# Patient Record
Sex: Male | Born: 1960 | Race: Black or African American | Hispanic: No | Marital: Single | Smoking: Current some day smoker
Health system: Southern US, Community
[De-identification: ages and names within clinical notes are randomized; demographics above are authoritative.]

## PROBLEM LIST (undated history)

## (undated) DIAGNOSIS — J40 Bronchitis, not specified as acute or chronic: Secondary | ICD-10-CM

## (undated) DIAGNOSIS — K920 Hematemesis: Secondary | ICD-10-CM

## (undated) DIAGNOSIS — R569 Unspecified convulsions: Secondary | ICD-10-CM

## (undated) DIAGNOSIS — Z7189 Other specified counseling: Secondary | ICD-10-CM

## (undated) DIAGNOSIS — I2699 Other pulmonary embolism without acute cor pulmonale: Secondary | ICD-10-CM

## (undated) DIAGNOSIS — E860 Dehydration: Secondary | ICD-10-CM

## (undated) DIAGNOSIS — Z720 Tobacco use: Secondary | ICD-10-CM

## (undated) DIAGNOSIS — R109 Unspecified abdominal pain: Secondary | ICD-10-CM

## (undated) DIAGNOSIS — F1010 Alcohol abuse, uncomplicated: Secondary | ICD-10-CM

## (undated) DIAGNOSIS — Z5111 Encounter for antineoplastic chemotherapy: Secondary | ICD-10-CM

## (undated) DIAGNOSIS — J069 Acute upper respiratory infection, unspecified: Secondary | ICD-10-CM

## (undated) DIAGNOSIS — I10 Essential (primary) hypertension: Secondary | ICD-10-CM

## (undated) DIAGNOSIS — J449 Chronic obstructive pulmonary disease, unspecified: Secondary | ICD-10-CM

## (undated) DIAGNOSIS — C801 Malignant (primary) neoplasm, unspecified: Secondary | ICD-10-CM

## (undated) DIAGNOSIS — C3492 Malignant neoplasm of unspecified part of left bronchus or lung: Secondary | ICD-10-CM

## (undated) DIAGNOSIS — B029 Zoster without complications: Secondary | ICD-10-CM

## (undated) DIAGNOSIS — K2970 Gastritis, unspecified, without bleeding: Secondary | ICD-10-CM

## (undated) HISTORY — DX: Unspecified abdominal pain: R10.9

## (undated) HISTORY — PX: NO PAST SURGERIES: SHX2092

## (undated) HISTORY — DX: Encounter for antineoplastic chemotherapy: Z51.11

## (undated) HISTORY — DX: Malignant neoplasm of unspecified part of left bronchus or lung: C34.92

## (undated) HISTORY — DX: Other specified counseling: Z71.89

## (undated) HISTORY — DX: Dehydration: E86.0

## (undated) HISTORY — DX: Essential (primary) hypertension: I10

---

## 1998-01-04 ENCOUNTER — Encounter: Payer: Self-pay | Admitting: Otolaryngology

## 1998-01-04 ENCOUNTER — Encounter: Payer: Self-pay | Admitting: Emergency Medicine

## 1998-01-04 ENCOUNTER — Inpatient Hospital Stay (HOSPITAL_COMMUNITY): Admission: EM | Admit: 1998-01-04 | Discharge: 1998-01-07 | Payer: Self-pay | Admitting: Emergency Medicine

## 1998-11-11 ENCOUNTER — Encounter: Payer: Self-pay | Admitting: Emergency Medicine

## 1998-11-11 ENCOUNTER — Emergency Department (HOSPITAL_COMMUNITY): Admission: EM | Admit: 1998-11-11 | Discharge: 1998-11-11 | Payer: Self-pay | Admitting: Emergency Medicine

## 1998-11-28 ENCOUNTER — Emergency Department (HOSPITAL_COMMUNITY): Admission: EM | Admit: 1998-11-28 | Discharge: 1998-11-28 | Payer: Self-pay | Admitting: Emergency Medicine

## 2001-06-08 ENCOUNTER — Emergency Department (HOSPITAL_COMMUNITY): Admission: EM | Admit: 2001-06-08 | Discharge: 2001-06-08 | Payer: Self-pay | Admitting: Emergency Medicine

## 2001-06-08 ENCOUNTER — Encounter: Payer: Self-pay | Admitting: Emergency Medicine

## 2004-08-19 ENCOUNTER — Emergency Department (HOSPITAL_COMMUNITY): Admission: EM | Admit: 2004-08-19 | Discharge: 2004-08-20 | Payer: Self-pay | Admitting: Emergency Medicine

## 2007-04-15 ENCOUNTER — Emergency Department (HOSPITAL_COMMUNITY): Admission: EM | Admit: 2007-04-15 | Discharge: 2007-04-15 | Payer: Self-pay | Admitting: Emergency Medicine

## 2009-03-27 ENCOUNTER — Emergency Department (HOSPITAL_COMMUNITY): Admission: EM | Admit: 2009-03-27 | Discharge: 2009-03-27 | Payer: Self-pay | Admitting: Emergency Medicine

## 2010-10-23 ENCOUNTER — Ambulatory Visit (INDEPENDENT_AMBULATORY_CARE_PROVIDER_SITE_OTHER): Payer: Self-pay

## 2010-10-23 ENCOUNTER — Inpatient Hospital Stay (INDEPENDENT_AMBULATORY_CARE_PROVIDER_SITE_OTHER)
Admission: RE | Admit: 2010-10-23 | Discharge: 2010-10-23 | Disposition: A | Discharge status: Home / Self Care | Payer: Self-pay | Source: Ambulatory Visit | Attending: Family Medicine | Admitting: Family Medicine

## 2010-10-23 DIAGNOSIS — J42 Unspecified chronic bronchitis: Secondary | ICD-10-CM

## 2011-04-15 ENCOUNTER — Emergency Department (INDEPENDENT_AMBULATORY_CARE_PROVIDER_SITE_OTHER): Payer: Self-pay

## 2011-04-15 ENCOUNTER — Encounter (HOSPITAL_COMMUNITY): Payer: Self-pay | Admitting: *Deleted

## 2011-04-15 ENCOUNTER — Emergency Department (INDEPENDENT_AMBULATORY_CARE_PROVIDER_SITE_OTHER)
Admission: EM | Admit: 2011-04-15 | Discharge: 2011-04-15 | Disposition: A | Discharge status: Home / Self Care | Payer: Self-pay | Source: Home / Self Care | Attending: Family Medicine | Admitting: Family Medicine

## 2011-04-15 DIAGNOSIS — J40 Bronchitis, not specified as acute or chronic: Secondary | ICD-10-CM

## 2011-04-15 MED ORDER — ALBUTEROL SULFATE HFA 108 (90 BASE) MCG/ACT IN AERS
2.0000 | INHALATION_SPRAY | Freq: Once | RESPIRATORY_TRACT | Status: AC
  Administered 2011-04-15: 2 via RESPIRATORY_TRACT

## 2011-04-15 MED ORDER — ALBUTEROL SULFATE HFA 108 (90 BASE) MCG/ACT IN AERS: 2.0000 | INHALATION_SPRAY | RESPIRATORY_TRACT | Status: DC | PRN

## 2011-04-15 MED ORDER — ALBUTEROL SULFATE HFA 108 (90 BASE) MCG/ACT IN AERS
INHALATION_SPRAY | RESPIRATORY_TRACT | Status: AC
  Filled 2011-04-15: qty 6.7

## 2011-04-15 NOTE — Discharge Instructions (Signed)
Your xray was negative for any acute findings such as pneumonia or other problems or infection. My suspicion is that this is chronic bronchitis from your many years of smoking. This is inflammation of the lungs due to the irritants of the cigarette smoke, along with long-term damage. I suspect you will need further testing such as pulmonary function testing, and possibly a maintenance medication at some point in the future. I have provided you an albuterol inhaler. Use albuterol every 4 to 6 hours for the next 24 to 48 hours, then as needed. Please follow up with [facility] for continued evaluation and management of your symptoms. Return to care should your symptoms not improve, or worsen in any way, such as shortness of breath or chest pain.

## 2011-04-15 NOTE — ED Notes (Signed)
Pt  Reports  Symptoms  Of  Cough  /  Congested        X  6  Months      -  Pt  Reports   Symptoms  Not  releived  By otc  meds      He  Also  Reports  Symptoms  Of  l  Lower  toothace  Which  He reports developed  Over  The  Weekend    He  Is  Masked  And   Is  Sitting  Upright on  The  Exam  Table   maske  And in a  private  room

## 2011-04-15 NOTE — ED Provider Notes (Signed)
History     CSN: [id number]  Arrival date & time 04/15/11  1449   First MD Initiated Contact with Patient 04/15/11 1521      Chief Complaint  Patient presents with  . Cough    (Consider location/radiation/quality/duration/timing/severity/associated sxs/prior treatment) HPI Comments: [name] presents for evaluation under recommendation of the Congregational Nurse for persistent, productive cough, over the last 6 months. He is a smoker, down to less than a pack a day, but was smoking 2.5 packs per day at his peak. He reports one episode of hemoptysis a few days ago, but mostly reports sputum production. He denies any fever. He does report new pain in his lower RIGHT teeth, but he has poor dentition.   Patient is a 51 y.o. male presenting with cough. The history is provided by the patient.  Cough This is a chronic problem. The current episode started more than 1 week ago. The cough is productive of sputum. There has been no fever. Pertinent negatives include no chest pain and no shortness of breath. He has tried nothing for the symptoms. He is a smoker.    History reviewed. No pertinent past medical history.  History reviewed. No pertinent past surgical history.  History reviewed. No pertinent family history.  History  Substance Use Topics  . Smoking status: Not on file  . Smokeless tobacco: Not on file  . Alcohol Use: Not on file      Review of Systems  Constitutional: Negative.   HENT: Positive for dental problem.   Eyes: Negative.   Respiratory: Positive for cough. Negative for shortness of breath.   Cardiovascular: Negative.  Negative for chest pain.  Gastrointestinal: Negative.   Genitourinary: Negative.   Musculoskeletal: Negative.   Skin: Negative.   Neurological: Negative.     Allergies  Review of patient's allergies indicates not on file.  Home Medications  No current outpatient prescriptions on file.  BP 128/89  Pulse 86  Temp(Src) 98.4 F (36.9 C)  (Oral)  Resp 18  SpO2 97%  Physical Exam  Nursing note and vitals reviewed. Constitutional: He is oriented to person, place, and time. He appears well-developed and well-nourished.  HENT:  Head: Normocephalic and atraumatic.  Mouth/Throat: Abnormal dentition. Dental caries present. No dental abscesses.         Poor dentition throughout with multiple missing teeth  Eyes: EOM are normal.  Neck: Normal range of motion.  Cardiovascular: Normal rate and regular rhythm.   Pulmonary/Chest: Effort normal and breath sounds normal. He has no decreased breath sounds. He has no wheezes. He has no rhonchi.  Musculoskeletal: Normal range of motion.  Neurological: He is alert and oriented to person, place, and time.  Skin: Skin is warm and dry.  Psychiatric: His behavior is normal.    ED Course  Procedures (including critical care time)  Labs Reviewed - No data to display Dg Chest 2 View  04/15/2011  *RADIOLOGY REPORT*  Clinical Data: Cough for 6 months.  CHEST - 2 VIEW  Comparison: 10/23/2010  Findings: The lungs are clear without focal infiltrate, edema, pneumothorax or pleural effusion. The cardiopericardial silhouette is within normal limits for size. Imaged bony structures of the thorax are intact.  IMPRESSION: Stable.  No acute findings.  Original Report Authenticated By: ERIC A. MANSELL, M.D.     1. Bronchitis       MDM  Xray reviewed by radiologist and myself; likely chronic bronchitis; given albuterol inhaler; advised to follow up with [facility]  Johnathan Munda, MD 04/15/11 [phone]

## 2011-05-22 DIAGNOSIS — R569 Unspecified convulsions: Secondary | ICD-10-CM

## 2011-05-22 HISTORY — DX: Unspecified convulsions: R56.9

## 2011-05-28 ENCOUNTER — Emergency Department (HOSPITAL_COMMUNITY): Payer: Self-pay

## 2011-05-28 ENCOUNTER — Inpatient Hospital Stay (HOSPITAL_COMMUNITY)
Admission: EM | Admit: 2011-05-28 | Discharge: 2011-05-30 | DRG: 101 | Disposition: A | Discharge status: Home / Self Care | Payer: Self-pay | Attending: Internal Medicine | Admitting: Internal Medicine

## 2011-05-28 ENCOUNTER — Encounter (HOSPITAL_COMMUNITY): Payer: Self-pay

## 2011-05-28 DIAGNOSIS — Z5900 Homelessness unspecified: Secondary | ICD-10-CM

## 2011-05-28 DIAGNOSIS — Z23 Encounter for immunization: Secondary | ICD-10-CM

## 2011-05-28 DIAGNOSIS — G40909 Epilepsy, unspecified, not intractable, without status epilepticus: Secondary | ICD-10-CM

## 2011-05-28 DIAGNOSIS — F17200 Nicotine dependence, unspecified, uncomplicated: Secondary | ICD-10-CM | POA: Diagnosis present

## 2011-05-28 DIAGNOSIS — R569 Unspecified convulsions: Principal | ICD-10-CM | POA: Diagnosis present

## 2011-05-28 DIAGNOSIS — Z720 Tobacco use: Secondary | ICD-10-CM | POA: Diagnosis present

## 2011-05-28 DIAGNOSIS — F1010 Alcohol abuse, uncomplicated: Secondary | ICD-10-CM | POA: Diagnosis present

## 2011-05-28 HISTORY — DX: Acute upper respiratory infection, unspecified: J06.9

## 2011-05-28 HISTORY — DX: Tobacco use: Z72.0

## 2011-05-28 HISTORY — DX: Bronchitis, not specified as acute or chronic: J40

## 2011-05-28 HISTORY — DX: Unspecified convulsions: R56.9

## 2011-05-28 LAB — HEPATIC FUNCTION PANEL
ALT: 15 U/L (ref 0–53)
AST: 27 U/L (ref 0–37)
Bilirubin, Direct: 0.1 mg/dL (ref 0.0–0.3)
Total Protein: 7.1 g/dL (ref 6.0–8.3)

## 2011-05-28 LAB — CARDIAC PANEL(CRET KIN+CKTOT+MB+TROPI)
CK, MB: 3.4 ng/mL (ref 0.3–4.0)
Relative Index: 0.7 (ref 0.0–2.5)
Total CK: 370 U/L — ABNORMAL HIGH (ref 7–232)

## 2011-05-28 LAB — RAPID URINE DRUG SCREEN, HOSP PERFORMED
Barbiturates: NOT DETECTED
Benzodiazepines: NOT DETECTED
Cocaine: NOT DETECTED
Opiates: NOT DETECTED

## 2011-05-28 LAB — POCT I-STAT, CHEM 8
Calcium, Ion: 1.13 mmol/L (ref 1.12–1.32)
Chloride: 106 mEq/L (ref 96–112)
Glucose, Bld: 124 mg/dL — ABNORMAL HIGH (ref 70–99)
HCT: 49 % (ref 39.0–52.0)
Hemoglobin: 16.7 g/dL (ref 13.0–17.0)
TCO2: 19 mmol/L (ref 0–100)

## 2011-05-28 LAB — DIFFERENTIAL
Basophils Absolute: 0 10*3/uL (ref 0.0–0.1)
Basophils Relative: 0 % (ref 0–1)
Eosinophils Absolute: 0.2 10*3/uL (ref 0.0–0.7)
Eosinophils Relative: 3 % (ref 0–5)
Monocytes Absolute: 0.7 10*3/uL (ref 0.1–1.0)
Neutro Abs: 2.7 10*3/uL (ref 1.7–7.7)

## 2011-05-28 LAB — HEMOGLOBIN A1C
Hgb A1c MFr Bld: 5.6 % (ref <5.7)
Mean Plasma Glucose: 114 mg/dL (ref <117)

## 2011-05-28 LAB — HIV ANTIBODY (ROUTINE TESTING W REFLEX): HIV: NONREACTIVE

## 2011-05-28 LAB — CBC
HCT: 44.8 % (ref 39.0–52.0)
Hemoglobin: 15.3 g/dL (ref 13.0–17.0)
MCH: 33.4 pg (ref 26.0–34.0)
MCH: 33.6 pg (ref 26.0–34.0)
MCHC: 35 g/dL (ref 30.0–36.0)
MCHC: 35.4 g/dL (ref 30.0–36.0)
MCV: 94.7 fL (ref 78.0–100.0)
RDW: 13.8 % (ref 11.5–15.5)

## 2011-05-28 LAB — CREATININE, SERUM: Creatinine, Ser: 0.9 mg/dL (ref 0.50–1.35)

## 2011-05-28 LAB — GLUCOSE, CAPILLARY

## 2011-05-28 LAB — TSH: TSH: 0.521 u[IU]/mL (ref 0.350–4.500)

## 2011-05-28 MED ORDER — ENOXAPARIN SODIUM 40 MG/0.4ML ~~LOC~~ SOLN
40.0000 mg | SUBCUTANEOUS | Status: DC
  Administered 2011-05-28 – 2011-05-29 (×2): 40 mg via SUBCUTANEOUS
  Filled 2011-05-28 (×3): qty 0.4

## 2011-05-28 MED ORDER — LORAZEPAM 2 MG/ML IJ SOLN: 0.0000 mg | Freq: Four times a day (QID) | INTRAMUSCULAR | Status: DC

## 2011-05-28 MED ORDER — LORAZEPAM 2 MG/ML IJ SOLN: 0.0000 mg | Freq: Two times a day (BID) | INTRAMUSCULAR | Status: DC

## 2011-05-28 MED ORDER — VITAMIN B-1 100 MG PO TABS
100.0000 mg | ORAL_TABLET | Freq: Every day | ORAL | Status: DC
  Administered 2011-05-29 – 2011-05-30 (×2): 100 mg via ORAL
  Filled 2011-05-28 (×3): qty 1

## 2011-05-28 MED ORDER — SODIUM CHLORIDE 0.9 % IV SOLN
INTRAVENOUS | Status: DC
  Administered 2011-05-28 – 2011-05-29 (×3): via INTRAVENOUS

## 2011-05-28 MED ORDER — LORAZEPAM 2 MG/ML IJ SOLN: 1.0000 mg | Freq: Four times a day (QID) | INTRAMUSCULAR | Status: DC | PRN

## 2011-05-28 MED ORDER — PNEUMOCOCCAL VAC POLYVALENT 25 MCG/0.5ML IJ INJ
0.5000 mL | INJECTION | INTRAMUSCULAR | Status: AC
  Administered 2011-05-29: 0.5 mL via INTRAMUSCULAR
  Filled 2011-05-28: qty 0.5

## 2011-05-28 MED ORDER — ASPIRIN EC 81 MG PO TBEC
81.0000 mg | DELAYED_RELEASE_TABLET | Freq: Every day | ORAL | Status: DC
  Administered 2011-05-29 – 2011-05-30 (×2): 81 mg via ORAL
  Filled 2011-05-28 (×3): qty 1

## 2011-05-28 MED ORDER — SODIUM CHLORIDE 0.9 % IJ SOLN
3.0000 mL | Freq: Two times a day (BID) | INTRAMUSCULAR | Status: DC
  Administered 2011-05-28 – 2011-05-29 (×3): 3 mL via INTRAVENOUS

## 2011-05-28 MED ORDER — ADULT MULTIVITAMIN W/MINERALS CH
1.0000 | ORAL_TABLET | Freq: Every day | ORAL | Status: DC
  Administered 2011-05-29 – 2011-05-30 (×2): 1 via ORAL
  Filled 2011-05-28 (×3): qty 1

## 2011-05-28 MED ORDER — FOLIC ACID 1 MG PO TABS
1.0000 mg | ORAL_TABLET | Freq: Every day | ORAL | Status: DC
  Administered 2011-05-29 – 2011-05-30 (×2): 1 mg via ORAL
  Filled 2011-05-28 (×3): qty 1

## 2011-05-28 MED ORDER — LORAZEPAM 1 MG PO TABS
1.0000 mg | ORAL_TABLET | Freq: Four times a day (QID) | ORAL | Status: DC | PRN
  Administered 2011-05-30: 1 mg via ORAL
  Filled 2011-05-28: qty 1

## 2011-05-28 MED ORDER — THIAMINE HCL 100 MG/ML IJ SOLN
100.0000 mg | Freq: Every day | INTRAMUSCULAR | Status: DC
  Filled 2011-05-28 (×2): qty 1

## 2011-05-28 MED ORDER — ACETAMINOPHEN 650 MG RE SUPP: 650.0000 mg | Freq: Four times a day (QID) | RECTAL | Status: DC | PRN

## 2011-05-28 MED ORDER — ACETAMINOPHEN 325 MG PO TABS
650.0000 mg | ORAL_TABLET | Freq: Four times a day (QID) | ORAL | Status: DC | PRN
  Administered 2011-05-28 – 2011-05-30 (×5): 650 mg via ORAL
  Filled 2011-05-28 (×5): qty 2

## 2011-05-28 NOTE — Progress Notes (Signed)
Pt. admitted with seizure dx;  c/o HA; given Tylenol, took one pill first with choking episode, attempted 2nd pill with another choking episode, wet voice afterward. Dr. Clyde Lundborg notified, order to make npo for now.   Zemira Zehring, Hassell Done, RN

## 2011-05-28 NOTE — Progress Notes (Signed)
Pt. requesting MD be called to determine when he can eat. Dr. Clyde Lundborg notified; per Dr. Clyde Lundborg give pt. Water and if pt. does fine with water, pt. may be started on regular diet. This RN d/w MD that pt. did fine with water previously but choked on pills. Dr. Clyde Lundborg again stated to re-attempt water and if pt. does fine with water alone, it is okay to start regular diet. Pt. given sips of water and did not show signs of aspiration. Dr. Clyde Lundborg made aware and order given to start regular diet.   Terral Cooks, Hassell Done, RN

## 2011-05-28 NOTE — ED Notes (Signed)
RN unavailable at this time.

## 2011-05-28 NOTE — ED Notes (Signed)
Pt presents to ED after a witnessed seizure. Bystanders states pt started to "convulse" and fell in th bushes. Upon EMS arrival pt was postitical, pt unable to answer questions per ems

## 2011-05-28 NOTE — ED Provider Notes (Signed)
History     CSN: [id number]  Arrival date & time 05/28/11  0840   First MD Initiated Contact with Patient 05/28/11 0845      Chief Complaint  Patient presents with  . Seizures     HPI Pt presents to ED after a witnessed seizure. Bystanders states pt started to "convulse" and fell in th bushes. Upon EMS arrival pt was postitical, pt unable to answer questions per ems.  Patient has no history of seizures.  At this time history is difficult because of patient's postictal state.  Past Medical History  Diagnosis Date  . Bronchitis due to tobacco use   . Shortness of breath   . Recurrent upper respiratory infection (URI)   . Seizures 05/2011    new onset    Past Surgical History  Procedure Date  . No past surgeries     History reviewed. No pertinent family history.  History  Substance Use Topics  . Smoking status: Current Everyday Smoker -- 1.0 packs/day for 30 years    Types: Cigarettes  . Smokeless tobacco: Never Used  . Alcohol Use: Yes     occasional      Review of Systems  Unable to perform ROS   Allergies  Review of patient's allergies indicates no known allergies.  Home Medications  No current outpatient prescriptions on file.  BP 139/80  Pulse 53  Temp(Src) 97.8 F (36.6 C) (Oral)  Resp 18  Ht 5\' 11"  (1.803 m)  Wt 142 lb 6.7 oz (64.6 kg)  BMI 19.86 kg/m2  SpO2 94%  Physical Exam  Nursing note and vitals reviewed. Constitutional: Vital signs are normal. He appears well-developed and well-nourished. No distress.  HENT:  Head: Normocephalic and atraumatic.  Eyes: Pupils are equal, round, and reactive to light.  Neck: Normal range of motion.  Cardiovascular: Normal rate and intact distal pulses.   Pulmonary/Chest: No respiratory distress.  Abdominal: Normal appearance. He exhibits no distension.  Musculoskeletal: Normal range of motion.  Neurological: He is alert. He has normal strength. No cranial nerve deficit.  Skin: Skin is warm and dry. No  rash noted.  Psychiatric: He has a normal mood and affect. His behavior is normal.    ED Course  Procedures (including critical care time)  Labs Reviewed  URINE RAPID DRUG SCREEN (HOSP PERFORMED) - Abnormal; Notable for the following:    Tetrahydrocannabinol POSITIVE (*)    All other components within normal limits  DIFFERENTIAL - Abnormal; Notable for the following:    Neutrophils Relative 40 (*)    Lymphocytes Relative 47 (*)    All other components within normal limits  POCT I-STAT, CHEM 8 - Abnormal; Notable for the following:    Glucose, Bld 124 (*)    All other components within normal limits  CBC - Abnormal; Notable for the following:    WBC 12.7 (*)    All other components within normal limits  URINALYSIS, ROUTINE W REFLEX MICROSCOPIC - Abnormal; Notable for the following:    Color, Urine AMBER (*) BIOCHEMICALS MAY BE AFFECTED BY COLOR   Bilirubin Urine SMALL (*)    Ketones, ur 15 (*)    All other components within normal limits  CARDIAC PANEL(CRET KIN+CKTOT+MB+TROPI) - Abnormal; Notable for the following:    Total CK 370 (*)    All other components within normal limits  CARDIAC PANEL(CRET KIN+CKTOT+MB+TROPI) - Abnormal; Notable for the following:    Total CK 450 (*)    All other components within normal limits  BASIC METABOLIC PANEL - Abnormal; Notable for the following:    GFR calc non Af Amer 81 (*)    All other components within normal limits  CBC - Abnormal; Notable for the following:    RBC 4.19 (*)    All other components within normal limits  CARDIAC PANEL(CRET KIN+CKTOT+MB+TROPI) - Abnormal; Notable for the following:    Total CK 403 (*)    All other components within normal limits  GLUCOSE, CAPILLARY - Abnormal; Notable for the following:    Glucose-Capillary 170 (*)    All other components within normal limits  ETHANOL  CBC  CREATININE, SERUM  TSH  HEMOGLOBIN A1C  HIV ANTIBODY (ROUTINE TESTING)  HEPATIC FUNCTION PANEL  GLUCOSE, CAPILLARY    GLUCOSE, CAPILLARY   Dg Chest 2 View  05/28/2011  *RADIOLOGY REPORT*  Clinical Data: New onset of seizures  CHEST - 2 VIEW  Comparison: 04/15/2011  Findings: Cardiomediastinal silhouette is stable.  No acute infiltrate or pleural effusion.  No pulmonary edema.  Bony thorax is stable.  No diagnostic pneumothorax.  IMPRESSION: No active disease.  No significant change.  Original Report Authenticated By: Natasha Mead, M.D.   Ct Head Wo Contrast  05/28/2011  *RADIOLOGY REPORT*  Clinical Data: Headache.  Seizure.  CT HEAD WITHOUT CONTRAST  Technique:  Contiguous axial images were obtained from the base of the skull through the vertex without contrast.  Comparison: Head CT scan 04/15/2007.  Findings: The brain appears normal without evidence of infarction, hemorrhage, mass lesion, mass effect, midline shift or abnormal extra-axial fluid collection.  No hydrocephalus or pneumocephalus. Calvarium intact.  Imaged paranasal sinuses and mastoid air cells are clear.  IMPRESSION: Negative examination.  Original Report Authenticated By: Bernadene Bell. Maricela Curet, M.D.     1. Seizure disorder       MDM          Nelia Shi, MD 05/29/11 [phone]

## 2011-05-28 NOTE — ED Notes (Signed)
Pt still unable to give a urine sample at this time ?

## 2011-05-28 NOTE — H&P (Signed)
Hospital Admission Note Date: 05/28/2011  Patient name: [name] Medical record number: [id number] Date of birth: [birth year] Age: 51 y.o. Gender: male PCP: DEFAULT,PROVIDER, MD, MD  Medical Service: Maurice March  Attending physician: Dr. Franky Macho Hours (7AM-5PM):  First Contact: Dr. Clyde Lundborg       Pager: [phone] Second Contact: Dr. Saralyn Pilar Pager: [phone]  After Hours (after 5PM)/ Weekend / Holidays: First Contact: Pager: [phone] Second Contact: Pager: [phone]   Chief Complaint: Seizure  History of Present Illness: Patient is a 51 year old male with no significant past medical history, experienced an unprovoked seizure activity with postictal state witnessed by bystanders, EMS were called and brought the patient to [facility] emergency room, upon arrival patient was postictal and slowly became more oriented and alert, does not recall the event. Denies any similar events in the past. Reports occasional alcohol use, last drink was Sunday, 05/26/2011. Denies any drug abuse. Initial workup in the ED including head CT and routine lab work were negative to find a trigger for his seizure activity. We were called to admit for further evaluation.   Meds: Medication List    Notice       You have not been prescribed any medications.             Allergies: Allergies as of 05/28/2011  . (No Known Allergies)   History reviewed. No pertinent past medical history. History reviewed. No pertinent past surgical history. History reviewed. No pertinent family history. History   Social History  . Marital Status: Single    Spouse Name: N/A    Number of Children: N/A  . Years of Education: N/A   Occupational History  . Not on file.   Social History Main Topics  . Smoking status: Current Everyday Smoker  . Smokeless tobacco: Not on file  . Alcohol Use: Yes  . Drug Use:   . Sexually Active:    Other Topics Concern  . Not on file   Social History Narrative  . Patient is  homeless.    Review of Systems: Negative except per history of present illness  Physical Exam: Blood pressure 144/77, pulse 68, temperature 97.6 F (36.4 C), temperature source Oral, resp. rate 24, SpO2 93.00%. General:  alert, well-developed, and cooperative to examination.   Head:  normocephalic and atraumatic.   Eyes:  vision grossly intact, pupils equal, pupils round, pupils reactive to light, no injection and anicteric.   Mouth:  pharynx pink and moist, no erythema, and no exudates.   Neck:  supple, full ROM, no thyromegaly, no JVD, and no carotid bruits.   Lungs:  normal respiratory effort, no accessory muscle use, normal breath sounds, no crackles, and no wheezes. Heart:  normal rate, regular rhythm, no murmur, no gallop, and no rub.   Abdomen:  soft, non-tender, normal bowel sounds, no distention, no guarding, no rebound tenderness, no hepatomegaly, and no splenomegaly.   Msk:  no joint swelling, no joint warmth, and no redness over joints.   Pulses:  2+ DP/PT pulses bilaterally Extremities:  No cyanosis, clubbing, edema Neurologic:  alert & oriented X3, cranial nerves II-XII intact, strength normal in all extremities, sensation intact to light touch, and gait normal.     Lab results: Basic Metabolic Panel:  Basename 05/28/11 0915  NA 139  K 3.6  CL 106  CO2 --  GLUCOSE 124*  BUN 11  CREATININE 1.20  CALCIUM --  MG --  PHOS --   CBC:  Basename 05/28/11  0915 05/28/11 0900  WBC -- 6.7  NEUTROABS -- 2.7  HGB 16.7 15.7  HCT 49.0 44.8  MCV -- 95.3  PLT -- 227   Urine Drug Screen: Drugs of Abuse  Pending   Alcohol Level:  Basename 05/28/11 0900  ETH <11   Urinalysis: No results found for this basename: COLORURINE:2,APPERANCEUR:2,LABSPEC:2,PHURINE:2,GLUCOSEU:2,HGBUR:2,BILIRUBINUR:2,KETONESUR:2,PROTEINUR:2,UROBILINOGEN:2,NITRITE:2,LEUKOCYTESUR:2 in the last 72 hours   Imaging results:  Dg Chest 2 View  05/28/2011  *RADIOLOGY REPORT*  Clinical Data: New  onset of seizures  CHEST - 2 VIEW  Comparison: 04/15/2011  Findings: Cardiomediastinal silhouette is stable.  No acute infiltrate or pleural effusion.  No pulmonary edema.  Bony thorax is stable.  No diagnostic pneumothorax.  IMPRESSION: No active disease.  No significant change.  Original Report Authenticated By: Natasha Mead, M.D.   Ct Head Wo Contrast  05/28/2011  *RADIOLOGY REPORT*  Clinical Data: Headache.  Seizure.  CT HEAD WITHOUT CONTRAST  Technique:  Contiguous axial images were obtained from the base of the skull through the vertex without contrast.  Comparison: Head CT scan 04/15/2007.  Findings: The brain appears normal without evidence of infarction, hemorrhage, mass lesion, mass effect, midline shift or abnormal extra-axial fluid collection.  No hydrocephalus or pneumocephalus. Calvarium intact.  Imaged paranasal sinuses and mastoid air cells are clear.  IMPRESSION: Negative examination.  Original Report Authenticated By: Bernadene Bell. Maricela Curet, M.D.    Assessment & Plan by Problem:  Seizure: This is new onset, witnessed by bystanders, pt does not recall the episodes but did have postictal confusion, patient states that he has never experienced this episode in the past, reports drinking alcohol; last drink was Sunday May 26, 2011. However patient denies excessive alcohol consumption. So far in the ED all tests including chemistries and Head CT failed to identify a trigger for his seizures such as Head trauma, Brain tumors, Stroke, Intracranial infection, Cerebral degeneration, Congenital brain malformations, Inborn errors of metabolism, UDS is pending.  Plan -Admit to tele and monitor for seizure activity.  -Will check for other triggers for his seizures such as EEG, electrolytes, thyroid function tests, HIV antibody, glucose, liver function tests, thyroid function tests and toxicology screens.  -Will also hold any medications that can trigger seizures. -Will place the patient on CIWA protocol and  IV Ativan as needed for seizure activity -Check CE x3 to and place on tele r/o cardiac cause of his event.  -Consider MRI or Neuro consult for further input.  -For now we will not empirically start antiseizure medications given that the patient has only had one seizure episode, will consider starting an antiepileptic agent if patient has a recurrent seizure.   DVT: Lovenox  Signed: Mazie Fencl 05/28/2011, 11:11 AM

## 2011-05-29 ENCOUNTER — Inpatient Hospital Stay (HOSPITAL_COMMUNITY): Payer: Self-pay

## 2011-05-29 DIAGNOSIS — R569 Unspecified convulsions: Secondary | ICD-10-CM

## 2011-05-29 LAB — CARDIAC PANEL(CRET KIN+CKTOT+MB+TROPI): Relative Index: 0.8 (ref 0.0–2.5)

## 2011-05-29 LAB — BASIC METABOLIC PANEL
BUN: 14 mg/dL (ref 6–23)
CO2: 27 mEq/L (ref 19–32)
Calcium: 9 mg/dL (ref 8.4–10.5)
Creatinine, Ser: 1.04 mg/dL (ref 0.50–1.35)
Glucose, Bld: 88 mg/dL (ref 70–99)

## 2011-05-29 LAB — URINALYSIS, ROUTINE W REFLEX MICROSCOPIC
Leukocytes, UA: NEGATIVE
Nitrite: NEGATIVE
Protein, ur: NEGATIVE mg/dL
Specific Gravity, Urine: 1.024 (ref 1.005–1.030)
Urobilinogen, UA: 0.2 mg/dL (ref 0.0–1.0)

## 2011-05-29 LAB — CBC
MCH: 32.2 pg (ref 26.0–34.0)
MCV: 95.5 fL (ref 78.0–100.0)
Platelets: 204 10*3/uL (ref 150–400)
RDW: 14.1 % (ref 11.5–15.5)

## 2011-05-29 LAB — GLUCOSE, CAPILLARY: Glucose-Capillary: 91 mg/dL (ref 70–99)

## 2011-05-29 NOTE — Consult Note (Signed)
Pt is a 1 ppd smoker who says he wants to quit but not in the near future. Interested in hearing about options available like with medical aids to help him with quitting. Discussed risk factors again briefly as well as options in pharmacotherapy to help pt with quitting. Pt verbalizes understanding. Referred to 1-800 quit now for f/u and support. Discussed oral fixation substitutes, second hand smoke and in home smoking policy. Reviewed and gave pt Written education/contact information.

## 2011-05-29 NOTE — Progress Notes (Signed)
Subjective:  Patient had good sleep and feels good, no new episode of seizure. Afebrile. No new issues.  Objective: Vital signs in last 24 hours: Filed Vitals:   05/28/11 2200 05/29/11 0100 05/29/11 0600 05/29/11 0938  BP: 135/66 127/69 139/80 119/76  Pulse: 69 52 53 55  Temp: 98.8 F (37.1 C) 98.2 F (36.8 C) 97.8 F (36.6 C) 97.3 F (36.3 C)  TempSrc: Oral Oral Oral Oral  Resp: 19 18 18 18   Height:  5\' 11"  (1.803 m)    Weight:  142 lb 6.7 oz (64.6 kg)    SpO2: 95% 97% 94% 99%   Weight change:   Intake/Output Summary (Last 24 hours) at 05/29/11 1032 Last data filed at 05/29/11 0747  Gross per 24 hour  Intake    360 ml  Output    350 ml  Net     10 ml   Blood pressure 144/77, pulse 68, temperature 97.6 F (36.4 C), temperature source Oral, resp. rate 24, SpO2 93.00%. General:  alert, well-developed, and cooperative to examination.   Head:  normocephalic and atraumatic.   Eyes:  vision grossly intact, pupils equal, pupils round, pupils reactive to light, no injection and anicteric.   Mouth:  pharynx pink and moist, no erythema, and no exudates.   Neck:  supple, full ROM, no thyromegaly, no JVD, and no carotid bruits.   Lungs:  normal respiratory effort, no accessory muscle use, normal breath sounds, no crackles, and no wheezes. Heart:  normal rate, regular rhythm, no murmur, no gallop, and no rub.   Abdomen:  soft, non-tender, normal bowel sounds, no distention, no guarding, no rebound tenderness, no hepatomegaly, and no splenomegaly.   Msk:  no joint swelling, no joint warmth, and no redness over joints.   Pulses:  2+ DP/PT pulses bilaterally Extremities:  No cyanosis, clubbing, edema Neurologic:  alert & oriented X3, cranial nerves II-XII intact, strength normal in all extremities, sensation intact to light touch, and gait normal.    Lab Results: Basic Metabolic Panel:  Lab 05/29/11 4403 05/28/11 1351 05/28/11 0915  NA 138 -- 139  K 3.6 -- 3.6  CL 103 -- 106  CO2  27 -- --  GLUCOSE 88 -- 124*  BUN 14 -- 11  CREATININE 1.04 0.90 --  CALCIUM 9.0 -- --  MG -- -- --  PHOS -- -- --   Liver Function Tests:  Lab 05/28/11 1351  AST 27  ALT 15  ALKPHOS 73  BILITOT 0.6  PROT 7.1  ALBUMIN 4.2   BC:  Lab 05/29/11 0545 05/28/11 1351 05/28/11 0900  WBC 6.7 12.7* --  NEUTROABS -- -- 2.7  HGB 13.5 15.3 --  HCT 40.0 43.2 --  MCV 95.5 94.7 --  PLT 204 221 --   Cardiac Enzymes:  Lab 05/29/11 0545 05/28/11 2135 05/28/11 1353  CKTOTAL 403* 450* 370*  CKMB 3.3 3.2 3.4  CKMBINDEX -- -- --  TROPONINI <0.30 <0.30 <0.30    Lab 05/29/11 0655 05/28/11 2106 05/28/11 1634  GLUCAP 91 170* 75   Hemoglobin A1C:  Lab 05/28/11 1351  HGBA1C 5.6  Thyroid Function Tests:  Lab 05/28/11 1351  TSH 0.521  T4TOTAL --  FREET4 --  T3FREE --  THYROIDAB --    Drugs of Abuse     Component Value Date/Time   LABOPIA NONE DETECTED 05/28/2011 1031   COCAINSCRNUR NONE DETECTED 05/28/2011 1031   LABBENZ NONE DETECTED 05/28/2011 1031   AMPHETMU NONE DETECTED 05/28/2011 1031   THCU  POSITIVE* 05/28/2011 1031   LABBARB NONE DETECTED 05/28/2011 1031    Alcohol Level:  Lab 05/28/11 0900  ETH <11   Urinalysis:  Lab 05/29/11 0122  COLORURINE AMBER*  LABSPEC 1.024  PHURINE 6.0  GLUCOSEU NEGATIVE  HGBUR NEGATIVE  BILIRUBINUR SMALL*  KETONESUR 15*  PROTEINUR NEGATIVE  UROBILINOGEN 0.2  NITRITE NEGATIVE  LEUKOCYTESUR NEGATIVE    Micro Results: No results found for this or any previous visit (from the past 240 hour(s)). Studies/Results: Dg Chest 2 View  05/28/2011  *RADIOLOGY REPORT*  Clinical Data: New onset of seizures  CHEST - 2 VIEW  Comparison: 04/15/2011  Findings: Cardiomediastinal silhouette is stable.  No acute infiltrate or pleural effusion.  No pulmonary edema.  Bony thorax is stable.  No diagnostic pneumothorax.  IMPRESSION: No active disease.  No significant change.  Original Report Authenticated By: Natasha Mead, M.D.   Ct Head Wo Contrast  05/28/2011   *RADIOLOGY REPORT*  Clinical Data: Headache.  Seizure.  CT HEAD WITHOUT CONTRAST  Technique:  Contiguous axial images were obtained from the base of the skull through the vertex without contrast.  Comparison: Head CT scan 04/15/2007.  Findings: The brain appears normal without evidence of infarction, hemorrhage, mass lesion, mass effect, midline shift or abnormal extra-axial fluid collection.  No hydrocephalus or pneumocephalus. Calvarium intact.  Imaged paranasal sinuses and mastoid air cells are clear.  IMPRESSION: Negative examination.  Original Report Authenticated By: Bernadene Bell. Maricela Curet, M.D.   Medications:  Scheduled Meds:   . aspirin EC  81 mg Oral Daily  . enoxaparin  40 mg Subcutaneous Q24H  . folic acid  1 mg Oral Daily  . LORazepam  0-4 mg Intravenous Q6H   Followed by  . LORazepam  0-4 mg Intravenous Q12H  . mulitivitamin with minerals  1 tablet Oral Daily  . pneumococcal 23 valent vaccine  0.5 mL Intramuscular Tomorrow-1000  . sodium chloride  3 mL Intravenous Q12H  . thiamine  100 mg Oral Daily   Or  . thiamine  100 mg Intravenous Daily   Continuous Infusions:   . sodium chloride 75 mL/hr at 05/28/11 2238   PRN Meds:.acetaminophen, acetaminophen, LORazepam, LORazepam Assessment/Plan:  # Seizure: This is new onset, witnessed by bystanders, pt does not recall the episodes but did have postictal confusion, patient states that he has never experienced this episode in the past, reports drinking alcohol; last drink was Sunday May 26, 2011. However patient denies excessive alcohol consumption.   So far, work up are negative for head-CT, TSH, BMP, CBC, UA. His total CK is lightly elevated with normal troponin. UDS positive for THC. His EKG showed bradycardia, but he is asymptomatic. It is not complete clear what triggered his seizure episode. Since this the first episode with normal head-CT scan. There was no new episode after admission. Will not put him on seizure medication now. It  may be related to alcohol and drug (positive for THC).    Plan - continue to monitor on tele bed - pending EEG. If EEG is normal, will d/c home. - will not consider MRI, unless there is an another episode.  #: social issue: patient is homeless and was living on the street with his friends.  Patient said he will go to live with his older brother in [place] in June. He wants to get social work's help for place to live before June. Will consult social work.   DVT: Lovenox      LOS: 1 day   Lorretta Harp  05/29/2011, 10:32 AM

## 2011-05-29 NOTE — Progress Notes (Signed)
Pt HR low 40's. Asymptomatic. MD notified. No new orders received. Will con't to monitor.

## 2011-05-29 NOTE — H&P (Signed)
IM Attending  55 homeless man admitted for seizure.  Description from bystanders via EMS.  Amnesia for ictus.  Probably incontinence.  No trauma.  VS and labs unremarkable.  CT negative. Imp:  [employer] seizure.  He does drink but denies excess or recent binge.  UDS positive for MJ, nothing else. He sleeps rough under a bridge over [address].  Eats daily at [employer].  Has an eligibility card for [facility] and is right there every day for lunch.  Has slept a various shelters over past 2 years.  Has a brother in [place] and believes he would be welcome to stay with him; requested SW visit to help with plans. Suggest:  Despite his relative destitution, he is soft-spoken and is connected with the [employer].  If he has a probable persisting diathesis for seizures, he might take medication, and he has a medical home.  Complete w/u with EEG and arrange f/u with [facility] or with the NP at [facility].  Ask SW to see him before discharge.  Alcohol counseling.

## 2011-05-29 NOTE — Progress Notes (Signed)
Routine EEG completed.  

## 2011-05-30 DIAGNOSIS — F1010 Alcohol abuse, uncomplicated: Secondary | ICD-10-CM

## 2011-05-30 DIAGNOSIS — Z720 Tobacco use: Secondary | ICD-10-CM | POA: Diagnosis present

## 2011-05-30 MED ORDER — NICOTINE 14 MG/24HR TD PT24
14.0000 mg | MEDICATED_PATCH | Freq: Every day | TRANSDERMAL | Status: DC
  Administered 2011-05-30: 14 mg via TRANSDERMAL
  Filled 2011-05-30 (×2): qty 1

## 2011-05-30 MED ORDER — FOLIC ACID 1 MG PO TABS: 1.0000 mg | ORAL_TABLET | Freq: Every day | ORAL | Status: AC

## 2011-05-30 MED ORDER — NICOTINE 14 MG/24HR TD PT24: 1.0000 | MEDICATED_PATCH | Freq: Every day | TRANSDERMAL | Status: AC

## 2011-05-30 MED ORDER — ASPIRIN 81 MG PO TBEC: 81.0000 mg | DELAYED_RELEASE_TABLET | Freq: Every day | ORAL | Status: AC

## 2011-05-30 MED ORDER — ADULT MULTIVITAMIN W/MINERALS CH: 1.0000 | ORAL_TABLET | Freq: Every day | ORAL | Status: DC

## 2011-05-30 MED ORDER — ACETAMINOPHEN 325 MG PO TABS: 650.0000 mg | ORAL_TABLET | Freq: Four times a day (QID) | ORAL | Status: AC | PRN

## 2011-05-30 MED ORDER — THIAMINE HCL 100 MG PO TABS: 100.0000 mg | ORAL_TABLET | Freq: Every day | ORAL | Status: AC

## 2011-05-30 NOTE — Progress Notes (Signed)
Clinical Social Work Department BRIEF PSYCHOSOCIAL ASSESSMENT 05/30/2011  Patient:  [name], [name]     Account Number:  [id number]     Admit date:  05/28/2011  Clinical Social Worker:  Conley Simmonds  Date/Time:  05/30/2011 09:40 AM  Referred by:  Physician  Date Referred:  05/30/2011 Referred for  Homelessness   Other Referral:   Interview type:  Patient Other interview type:    PSYCHOSOCIAL DATA Living Status:  OTHER Admitted from facility:   Level of care:   Primary support name:  Vint Pola Primary support relationship to patient:  PARENT Degree of support available:   Lacking    CURRENT CONCERNS Current Concerns  Other - See comment   Other Concerns:   Housing    SOCIAL WORK ASSESSMENT / PLAN CSW met with pt at bedside to discuss d/c planning and housing concerns. Pt alert and oriented x3; Pt has been homeless for 2 years living under various bridges throughout [place]. Pt is working with [facility] and volunteering there but has been unable to find stable housing. [facility] and is open to any housing options available. CSW investigating availability at local shelters and will provide referrals once obtained.   Assessment/plan status:  Psychosocial Support/Ongoing Assessment of Needs Other assessment/ plan:   Information/referral to community resources:   Product manager    PATIENT'S/FAMILY'S RESPONSE TO PLAN OF CARE: Pt very appreciative of CSW intervention. Pt anxious to d/c and hesitant to contact any family members for additional support. Pt agreeable to allowing SW time to investigate resources and appears agreeable to any options or resources that are available.  Jodean Lima, [phone]

## 2011-05-30 NOTE — Discharge Summary (Signed)
Internal Medicine Teaching [facility] Discharge Note  Name: [name] MRN: [id number] DOB: [birth year] 51 y.o.  Date of Admission: 05/28/2011  8:41 AM Date of Discharge: 05/30/2011 Attending Physician: Ulyess Mort, MD  Discharge Diagnosis: Principal Problem:  *Seizure Tobacco abuse Alcohol abuse  Discharge Medications: Medication List  As of 05/30/2011 11:21 AM   TAKE these medications         acetaminophen 325 MG tablet   Commonly known as: TYLENOL   Take 2 tablets (650 mg total) by mouth every 6 (six) hours as needed (or Fever >/= 101).      aspirin 81 MG EC tablet   Take 1 tablet (81 mg total) by mouth daily.      folic acid 1 MG tablet   Commonly known as: FOLVITE   Take 1 tablet (1 mg total) by mouth daily.      mulitivitamin with minerals Tabs   Take 1 tablet by mouth daily.      nicotine 14 mg/24hr patch   Commonly known as: NICODERM CQ - dosed in mg/24 hours   Place 1 patch onto the skin daily.      thiamine 100 MG tablet   Take 1 tablet (100 mg total) by mouth daily.            Disposition and follow-up:   [name] was discharged from [facility] in Stable condition.  At the hospital follow up visit please address:  1. To assure he does not have new episode of seizure. 2. To educate the importance of taking his medications and stop smoking.  Follow-up Appointments: Follow-up Information    Follow up with Genella Mech, MD on 06/14/2011. (at 2:15 PM. Please call the clinic if you will be unable to make the appointment.)    Contact information:   [address] [place] [address] [phone]         Discharge Orders    Future Appointments: Provider: Department: Dept Phone: Center:   06/14/2011 2:15 PM Judie Bonus, MD [facility] [phone] [facility]     Future Orders Please Complete By Expires   Diet general      Increase activity slowly      Call MD for:  persistant  dizziness or light-headedness      Call MD for:  extreme fatigue         Consultations:  none  Procedures Performed:  Dg Chest 2 View  05/28/2011  *RADIOLOGY REPORT*  Clinical Data: New onset of seizures  CHEST - 2 VIEW  Comparison: 04/15/2011  Findings: Cardiomediastinal silhouette is stable.  No acute infiltrate or pleural effusion.  No pulmonary edema.  Bony thorax is stable.  No diagnostic pneumothorax.  IMPRESSION: No active disease.  No significant change.  Original Report Authenticated By: Natasha Mead, M.D.   Ct Head Wo Contrast  05/28/2011  *RADIOLOGY REPORT*  Clinical Data: Headache.  Seizure.  CT HEAD WITHOUT CONTRAST  Technique:  Contiguous axial images were obtained from the base of the skull through the vertex without contrast.  Comparison: Head CT scan 04/15/2007.  Findings: The brain appears normal without evidence of infarction, hemorrhage, mass lesion, mass effect, midline shift or abnormal extra-axial fluid collection.  No hydrocephalus or pneumocephalus. Calvarium intact.  Imaged paranasal sinuses and mastoid air cells are clear.  IMPRESSION: Negative examination.  Original Report Authenticated By: Bernadene Bell. Maricela Curet, M.D.   Admission HPI:   Patient is a 51 year old  male with no significant past medical history, experienced an unprovoked seizure activity with postictal state witnessed by bystanders, EMS were called and brought the patient to [facility] emergency room, upon arrival patient was postictal and slowly became more oriented and alert, does not recall the event. Denies any similar events in the past. Reports occasional alcohol use, last drink was Sunday, 05/26/2011. Denies any drug abuse. Initial workup in the ED including head CT and routine lab work were negative to find a trigger for his seizure activity. We were called to admit for further evaluation.   Physical Exam: Blood pressure 144/77, pulse 68, temperature 97.6 F (36.4 C), temperature source Oral, resp. rate  24, SpO2 93.00%. General:  alert, well-developed, and cooperative to examination.   Head:  normocephalic and atraumatic.   Eyes:  vision grossly intact, pupils equal, pupils round, pupils reactive to light, no injection and anicteric.   Mouth:  pharynx pink and moist, no erythema, and no exudates.   Neck:  supple, full ROM, no thyromegaly, no JVD, and no carotid bruits.   Lungs:  normal respiratory effort, no accessory muscle use, normal breath sounds, no crackles, and no wheezes. Heart:  normal rate, regular rhythm, no murmur, no gallop, and no rub.   Abdomen:  soft, non-tender, normal bowel sounds, no distention, no guarding, no rebound tenderness, no hepatomegaly, and no splenomegaly.   Msk:  no joint swelling, no joint warmth, and no redness over joints.   Pulses:  2+ DP/PT pulses bilaterally Extremities:  No cyanosis, clubbing, edema Neurologic:  alert & oriented X3, cranial nerves II-XII intact, strength normal in all extremities, sensation intact to light touch, and gait normal.     Lab results: Basic Metabolic Panel:  Basename  05/28/11 0915   NA  139   K  3.6   CL  106   CO2  --   GLUCOSE  124*   BUN  11   CREATININE  1.20   CALCIUM  --   MG  --   PHOS  --    CBC:  Basename  05/28/11 0915  05/28/11 0900   WBC  --  6.7   NEUTROABS  --  2.7   HGB  16.7  15.7   HCT  49.0  44.8   MCV  --  95.3   PLT  --  227    Urine Drug Screen: Drugs of Abuse   Pending   Alcohol Level:  Basename  05/28/11 0900   [facility]  <11    Hospital Course by problem list:  # Seizure: This is new onset, witnessed by bystanders, pt does not recall the episodes but did have postictal confusion, patient states that he has never experienced this episode in the past, reports drinking alcohol; last drink was Sunday May 26, 2011.  During hospital,  work ups are negative for head-CT, TSH, BMP, CBC, UA. His total CK is lightly elevated with normal troponin. UDS positive for THC. His EKG showed  bradycardia, but he is asymptomatic. It is not complete clear what triggered his seizure episode.  It may be related to alcohol and drug (positive for THC). Patient was monitored in hospital and managed symptomatically for headache with tylenol. Since this is the first episode with normal head-CT scan. There was no new episode after admission. We did not put him on seizure medications in hospital. His EEG is negative. He is discharged to shelter. He is to follow up in clinic on 5/24. Case manager will  help get free medications on discharge.  # Tobacco abuse: Patient did not have withdraw symptoms. treated with Nicotine patch in hospital. Discharge on nicotine patch.  # Alcohol abuse: treated with CIWA protocol. Stable in hosptial.     Discharge Vitals:  BP 117/75  Pulse 71  Temp(Src) 98.5 F (36.9 C) (Oral)  Resp 18  Ht 5\' 11"  (1.803 m)  Wt 142 lb 6.7 oz (64.6 kg)  BMI 19.86 kg/m2  SpO2 96%  Discharge Labs:  No results found for this or any previous visit (from the past 24 hour(s)).  Signed: Lorretta Harp 05/30/2011, 11:21 AM   Time Spent on Discharge: 35.

## 2011-05-30 NOTE — Care Management Note (Signed)
    Page 1 of 1   05/30/2011     12:31:21 PM   CARE MANAGEMENT NOTE 05/30/2011  Patient:  [name], [name]   Account Number:  [id number]  Date Initiated:  05/30/2011  Documentation initiated by:  Onnie Boer  Subjective/Objective Assessment:   PT WAS ADMITTED WITH SEIZURES     Action/Plan:   PROGRESSION OF CARE AND DISCHARGE PLANNING   Anticipated DC Date:  05/30/2011   Anticipated DC Plan:  HOME/SELF CARE  In-house referral  Clinical Social Worker      DC Planning Services  CM consult      Choice offered to / List presented to:             Status of service:  Completed, signed off Medicare Important Message given?   (If response is "NO", the following Medicare IM given date fields will be blank) Date Medicare IM given:   Date Additional Medicare IM given:    Discharge Disposition:  HOME/SELF CARE  Per UR Regulation:  Reviewed for med. necessity/level of care/duration of stay  If discussed at Long Length of Stay Meetings, dates discussed:    Comments:  05/30/11 Onnie Boer, RN, BSN 1229 PT WAS ADMITTED WITH [name] WITNESSED SEIZURE.  PT IS HOMELESS AND [facility], UNTIL 1 PM WHEN [facility] ACCOMADATIONS CAN BE MADE.  PT WAS GIVEN [name] MEAL TICKET AND BUS PASS BY CSW.  PT ALSO RECEIVED MEDS THROUGHT THE ZZ FUND.

## 2011-05-30 NOTE — Progress Notes (Signed)
Clinical Social Work-CSW contacted [facility] to inquire about bed availability. This shelter has strict eligibility requirements including proof of income and pt unsure if he will be able to provide. CSW left message and is awaiting call back with regards to bed availability. CSW contacted [facility] and representative is unable to say wether or not they will have a bed before 1pm. CSW discussed with pt and pt opted to arrive at shelter upon d/c regardless of 1pm phone call.  CSW provided contact information for both shelters, buss pass, meal voucher and contact information for any follow up this CSW may be able to provide after d/c-including a letter regarding recent hospital stay.  CSW notified MD and CM in order to ascertain if pt eligible for any ZZ funds/med assistance.  No further needs at this time- Jodean Lima, [phone]

## 2011-05-30 NOTE — Procedures (Signed)
EEG NUMBER:  [id number].  This routine EEG was requested in this 51 year old male who has had a new-onset seizure witnessed by bystanders.  The purpose of this EEG is to look for interictal epileptiform discharges.  He is on no anticonvulsant medication.  The EEG was done with the patient awake and drowsy.  During periods of maximal wakefulness, he had a 9-10 cycle per second posterior dominant rhythm that attenuated with eye opening and was symmetric.  Background activities were composed of low-amplitude beta activities that were frontally dominant and symmetric.  Photic stimulation produced symmetric driving response.  The patient was not hyperventilated due to history of respiratory disease.  The patient did become drowsy as characterized by attenuation of the alpha rhythm as well as the  emergence of slower theta and delta activities that were symmetric.  Symmetric vertex sharp waves were also seen.  CLINICAL INTERPRETATION:  This routine EEG done with the patient awake and drowsy is normal.          ______________________________ Denton Meek, MD    [id number] D:  05/30/2011 07:19:57  T:  05/30/2011 07:29:49  Job #:  [id number]

## 2011-05-30 NOTE — Discharge Instructions (Signed)
1. Please follow-up in the clinic as scheduled for you. 2. Please take all medications as prescribed.  3. If you have worsening of your symptoms or new symptoms arise, please call the clinic ([phone]), or go to the ER immediately if symptoms are severe. 4.

## 2011-06-14 ENCOUNTER — Encounter: Payer: Self-pay | Admitting: Internal Medicine

## 2016-04-22 ENCOUNTER — Telehealth: Payer: Self-pay | Admitting: *Deleted

## 2016-04-22 NOTE — Telephone Encounter (Signed)
Oncology Nurse Navigator Documentation  Oncology Nurse Navigator Flowsheets 04/22/2016  Referral date to RadOnc/MedOnc 04/22/2016  Navigator Encounter Type Telephone/I received referral today on Mr. Mcmahill.  I called home phone but was unable to get through.  I called mobil phone and was unable to reach or leave a vm message.   Telephone Outgoing Call  Treatment Phase Abnormal Scans  Barriers/Navigation Needs Coordination of Care  Interventions Coordination of Care  Coordination of Care Appts  Acuity Level 1  Time Spent with Patient 15

## 2016-04-23 ENCOUNTER — Telehealth: Payer: Self-pay | Admitting: *Deleted

## 2016-04-23 NOTE — Telephone Encounter (Signed)
Oncology Nurse Navigator Documentation  Oncology Nurse Navigator Flowsheets 04/23/2016  Navigator Location [facility]  Navigator Encounter Type Telephone/[name] called HIM dept with a new phone number to call for an appt.  I called [name] and updated him on appt.  I also asked that he go to ED if he was having pain or SOB.  I verbalized understanding of appt.   I called [facility] to obtain CD of patient's recent scans.    Telephone Outgoing Call  Treatment Phase Pre-Tx/Tx Discussion  Barriers/Navigation Needs Coordination of Care  Interventions Coordination of Care  Coordination of Care Appts;Other  Acuity Level 2  Acuity Level 2 Assistance expediting appointments;Other  Time Spent with Patient 45

## 2016-04-23 NOTE — Telephone Encounter (Signed)
Oncology Nurse Navigator Documentation  Oncology Nurse Navigator Flowsheets 04/23/2016  Navigator Location [facility]  Navigator Encounter Type Telephone/I called [facility] in [place].  I requested records and CD of scans.  I was told needed release of information.  I was given fax for med records.  I called patient and requested he to come into fill out release of information information.  He states he will be here tomorrow.   Telephone Outgoing Call  Treatment Phase Pre-Tx/Tx Discussion  Barriers/Navigation Needs Coordination of Care  Interventions Coordination of Care  Coordination of Care Appts;Other  Acuity Level 3  Time Spent with Patient 45

## 2016-04-24 ENCOUNTER — Encounter: Payer: Self-pay | Admitting: *Deleted

## 2016-04-24 NOTE — Progress Notes (Signed)
Oncology Nurse Navigator Documentation  Oncology Nurse Navigator Flowsheets 04/24/2016  Navigator Location [facility]  Navigator Encounter Type Lobby/[name] came to see me today.  He completed release of information form.  I faxed to [facility] to get more medical records and CD's of scans.    Abnormal Finding Date 02/20/2016  Confirmed Diagnosis Date 03/19/2016  Treatment Phase Pre-Tx/Tx Discussion  Barriers/Navigation Needs Coordination of Care  Interventions Coordination of Care  Coordination of Care Other  Acuity Level 2  Acuity Level 2 Other  Time Spent with Patient 30

## 2016-04-25 ENCOUNTER — Encounter: Payer: Self-pay | Admitting: *Deleted

## 2016-04-25 NOTE — Progress Notes (Signed)
Oncology Nurse Navigator Documentation  Oncology Nurse Navigator Flowsheets 04/25/2016  Navigator Location [facility]  Navigator Encounter Type Letter/Fax/Email/Mailed [place] leter I contacted [facility] to obtained CD of scans.  I completed requested information and faxed along with release of information.  Confirmation fax received.   Treatment Phase Pre-Tx/Tx Discussion  Barriers/Navigation Needs Coordination of Care;Education  Education Other  Interventions Coordination of Care;Education  Coordination of Care Other  Education Method Written  Acuity Level 2  Acuity Level 2 Other  Time Spent with Patient 39

## 2016-04-29 ENCOUNTER — Encounter (HOSPITAL_COMMUNITY): Payer: Self-pay | Admitting: Emergency Medicine

## 2016-04-29 ENCOUNTER — Emergency Department (HOSPITAL_COMMUNITY)
Admission: EM | Admit: 2016-04-29 | Discharge: 2016-04-29 | Disposition: A | Discharge status: Home / Self Care | Payer: Medicaid Other | Attending: Emergency Medicine | Admitting: Emergency Medicine

## 2016-04-29 DIAGNOSIS — C7989 Secondary malignant neoplasm of other specified sites: Secondary | ICD-10-CM | POA: Insufficient documentation

## 2016-04-29 DIAGNOSIS — C3490 Malignant neoplasm of unspecified part of unspecified bronchus or lung: Secondary | ICD-10-CM | POA: Diagnosis not present

## 2016-04-29 DIAGNOSIS — Z79899 Other long term (current) drug therapy: Secondary | ICD-10-CM | POA: Insufficient documentation

## 2016-04-29 DIAGNOSIS — Z720 Tobacco use: Secondary | ICD-10-CM

## 2016-04-29 DIAGNOSIS — F17210 Nicotine dependence, cigarettes, uncomplicated: Secondary | ICD-10-CM | POA: Insufficient documentation

## 2016-04-29 DIAGNOSIS — J449 Chronic obstructive pulmonary disease, unspecified: Secondary | ICD-10-CM | POA: Insufficient documentation

## 2016-04-29 HISTORY — DX: Malignant (primary) neoplasm, unspecified: C80.1

## 2016-04-29 MED ORDER — MORPHINE SULFATE 15 MG PO TABS: 30.0000 mg | ORAL_TABLET | Freq: Four times a day (QID) | ORAL | 0 refills | Status: DC | PRN

## 2016-04-29 MED ORDER — IPRATROPIUM-ALBUTEROL 20-100 MCG/ACT IN AERS: 1.0000 | INHALATION_SPRAY | Freq: Four times a day (QID) | RESPIRATORY_TRACT | 0 refills | Status: DC

## 2016-04-29 NOTE — ED Notes (Signed)
Pt refused to sign.

## 2016-04-29 NOTE — ED Triage Notes (Signed)
Patient reports head, stomach, back pain x3 months. Patient was sent here by oncologist. Patient has not been seen by this cancer doctor. Patient was seen by oncologist in [place]. Patient dx with stage 4 lung cancer. Patient is not currently on chemo or radiation.

## 2016-04-29 NOTE — Discharge Instructions (Signed)
Follow-up for your appointment at the oncology center on 05/02/2016, as scheduled.  Try to stop smoking.

## 2016-04-29 NOTE — ED Provider Notes (Signed)
[facility] DEPT Provider Note   CSN: [id number] Arrival date & time: 04/29/16  0855     History   Chief Complaint Chief Complaint  Patient presents with  . Generalized Pain  . Cancer Pt    HPI [name] is a 56 y.o. male.  He presents for refill on morphine, prescribed for metastatic lung cancer.  He has not seen a local oncologist yet, but has an upcoming appointment, on 05/02/2016.  He is also out of his Combivent inhaler.  He came here today, because he needs medicine, before his upcoming appointment, and they would not refill his prescription, until he is seen.  He denies headache weakness dizziness fever chills nausea or vomiting.  There are no other known modifying factors.    HPI  Past Medical History:  Diagnosis Date  . Bronchitis due to tobacco use ([place])   . Cancer ([place])    Lung  . Recurrent upper respiratory infection (URI)   . Seizures ([place]) 05/2011   new onset  . Shortness of breath     Patient Active Problem List   Diagnosis Date Noted  . Tobacco abuse 05/30/2011  . Alcohol abuse 05/30/2011  . Seizure ([place]) 05/28/2011    Past Surgical History:  Procedure Laterality Date  . NO PAST SURGERIES         Home Medications    Prior to Admission medications   Medication Sig Start Date End Date Taking? Authorizing Provider  Ipratropium-Albuterol (COMBIVENT) 20-100 MCG/ACT AERS respimat Inhale 1 puff into the lungs every 6 (six) hours. 04/29/16   Daleen Bo, MD  morphine (MSIR) 15 MG tablet Take 2 tablets (30 mg total) by mouth every 6 (six) hours as needed for severe pain. 04/29/16   Daleen Bo, MD    Family History No family history on file.  Social History Social History  Substance Use Topics  . Smoking status: Current Some Day Smoker    Packs/day: 0.00    Years: 30.00    Types: Cigarettes  . Smokeless tobacco: Never Used  . Alcohol use No     Comment: occasional     Allergies   Patient has no known allergies.   Review of  Systems Review of Systems  All other systems reviewed and are negative.    Physical Exam Updated Vital Signs BP (!) 155/78   Pulse 73   Temp 97.8 F (36.6 C) (Oral)   Resp 16   Ht '5\' 11"'$  (1.803 m)   Wt 130 lb (59 kg)   SpO2 100%   BMI 18.13 kg/m   Physical Exam  Constitutional: He is oriented to person, place, and time. He appears well-developed.  He appears older than stated age.  He appears undernourished.  HENT:  Head: Normocephalic and atraumatic.  Right Ear: External ear normal.  Left Ear: External ear normal.  Eyes: Conjunctivae and EOM are normal. Pupils are equal, round, and reactive to light.  Neck: Normal range of motion and phonation normal. Neck supple.  Cardiovascular: Normal rate, regular rhythm and normal heart sounds.   Pulmonary/Chest: Effort normal and breath sounds normal. He exhibits no bony tenderness.  Abdominal: Soft. There is no tenderness.  Musculoskeletal: Normal range of motion.  Neurological: He is alert and oriented to person, place, and time. No cranial nerve deficit or sensory deficit. He exhibits normal muscle tone. Coordination normal.  Skin: Skin is warm, dry and intact.  Psychiatric: He has a normal mood and affect. His behavior is normal. Judgment and  thought content normal.  Nursing note and vitals reviewed.    ED Treatments / Results  Labs (all labs ordered are listed, but only abnormal results are displayed) Labs Reviewed - No data to display  EKG  EKG Interpretation None       Radiology No results found.  Procedures Procedures (including critical care time)  Medications Ordered in ED Medications - No data to display   Initial Impression / Assessment and Plan / ED Course  I have reviewed the triage vital signs and the nursing notes.  Pertinent labs & imaging results that were available during my care of the patient were reviewed by me and considered in my medical decision making (see chart for details).  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  Patient does have an appointment on 05/02/2016, to initiate care here in [place]. Apparently.  He has not received any directed care for his cancer, yet.  [EW]    Clinical Course User Index [EW] Daleen Bo, MD    Medications - No data to display  Patient Vitals for the past 24 hrs:  BP Temp Temp src Pulse Resp SpO2 Height Weight  04/29/16 1016 (!) 155/78 - - 73 16 100 % - -  04/29/16 0910 - - - - - - '5\' 11"'$  (1.803 m) 130 lb (59 kg)  04/29/16 0905 (!) 184/141 97.8 F (36.6 C) Oral 88 16 95 % - -    At discharge- reevaluation with update and discussion. After initial assessment and treatment, an updated evaluation reveals he remains fairly comfortable has no further complaints.  Findings discussed with patient and all questions answered. [name] L     Final Clinical Impressions(s) / ED Diagnoses   Final diagnoses:  Primary malignant neoplasm of lung metastatic to other site, unspecified laterality ([place])  Tobacco abuse  Chronic obstructive pulmonary disease, unspecified COPD type ([place])   Patient with stage IV lung cancer, currently out of chronic medications for pain with bony pain secondary to cancer.  He has a short-term follow-up planned with oncology.  Nursing Notes Reviewed/ Care Coordinated Applicable Imaging Reviewed Interpretation of Laboratory Data incorporated into ED treatment  The patient appears reasonably screened and/or stabilized for discharge and I doubt any other medical condition or other [facility] requiring further screening, evaluation, or treatment in the ED at this time prior to discharge.  Plan: Home Medications-continue usual treatment plan; Home Treatments-rest; return here if  the recommended treatment, does not improve the symptoms; Recommended follow up-oncology, as scheduled   New Prescriptions Discharge Medication List as of 04/29/2016 10:37 AM       Daleen Bo, MD 04/30/16 [phone]

## 2016-04-29 NOTE — ED Notes (Signed)
Pt wanting refill of morphine '15mg'$  tablets and combivent inhaler. Pt refusing blood work or other testing and states this is just more money I don't understand why the cancer doctor couldn't just give me another prescription. [name]

## 2016-04-30 ENCOUNTER — Encounter: Payer: Self-pay | Admitting: *Deleted

## 2016-04-30 NOTE — Progress Notes (Signed)
Oncology Nurse Navigator Documentation  Oncology Nurse Navigator Flowsheets 04/30/2016  Navigator Location [facility]  Navigator Encounter Type Letter/Fax/Email/I called [facility] to check on CD for patient.  Apparently, patient obtained scans at a different facility.  I contacted [facility] and spoke with radiology.  I faxed release of information and I was told CD will be mailed today.   Telephone Outgoing Call  Treatment Phase Pre-Tx/Tx Discussion  Barriers/Navigation Needs Coordination of Care  Interventions Coordination of Care  Coordination of Care Other  Acuity Level 2  Time Spent with Patient 45

## 2016-05-01 ENCOUNTER — Other Ambulatory Visit: Payer: Self-pay | Admitting: Medical Oncology

## 2016-05-01 ENCOUNTER — Encounter: Payer: Self-pay | Admitting: *Deleted

## 2016-05-01 DIAGNOSIS — C3490 Malignant neoplasm of unspecified part of unspecified bronchus or lung: Secondary | ICD-10-CM

## 2016-05-01 NOTE — Progress Notes (Signed)
Oncology Nurse Navigator Documentation  Oncology Nurse Navigator Flowsheets 05/01/2016  Navigator Location [facility]  Navigator Encounter Type Other/I received a fax today that CD of scans have not been sent to me.  I called and spoke with [facility] regarding scans.  She requested I re-fax request and she will send them out today in the mail.    Treatment Phase Pre-Tx/Tx Discussion  Barriers/Navigation Needs Coordination of Care  Interventions Coordination of Care  Coordination of Care Other  Acuity Level 2  Time Spent with Patient 30

## 2016-05-02 ENCOUNTER — Ambulatory Visit
Admission: RE | Admit: 2016-05-02 | Discharge: 2016-05-02 | Disposition: A | Discharge status: Home / Self Care | Payer: Medicaid Other | Source: Ambulatory Visit | Attending: Radiation Oncology | Admitting: Radiation Oncology

## 2016-05-02 ENCOUNTER — Ambulatory Visit (HOSPITAL_BASED_OUTPATIENT_CLINIC_OR_DEPARTMENT_OTHER): Payer: Self-pay | Admitting: Internal Medicine

## 2016-05-02 ENCOUNTER — Other Ambulatory Visit (HOSPITAL_BASED_OUTPATIENT_CLINIC_OR_DEPARTMENT_OTHER): Payer: Self-pay

## 2016-05-02 ENCOUNTER — Encounter: Payer: Self-pay | Admitting: *Deleted

## 2016-05-02 ENCOUNTER — Encounter: Payer: Self-pay | Admitting: Internal Medicine

## 2016-05-02 DIAGNOSIS — C3412 Malignant neoplasm of upper lobe, left bronchus or lung: Secondary | ICD-10-CM

## 2016-05-02 DIAGNOSIS — Z79899 Other long term (current) drug therapy: Secondary | ICD-10-CM | POA: Insufficient documentation

## 2016-05-02 DIAGNOSIS — C3492 Malignant neoplasm of unspecified part of left bronchus or lung: Secondary | ICD-10-CM

## 2016-05-02 DIAGNOSIS — Z7189 Other specified counseling: Secondary | ICD-10-CM

## 2016-05-02 DIAGNOSIS — Z5111 Encounter for antineoplastic chemotherapy: Secondary | ICD-10-CM

## 2016-05-02 DIAGNOSIS — C7951 Secondary malignant neoplasm of bone: Secondary | ICD-10-CM | POA: Insufficient documentation

## 2016-05-02 DIAGNOSIS — J90 Pleural effusion, not elsewhere classified: Secondary | ICD-10-CM

## 2016-05-02 DIAGNOSIS — I313 Pericardial effusion (noninflammatory): Secondary | ICD-10-CM

## 2016-05-02 DIAGNOSIS — F17210 Nicotine dependence, cigarettes, uncomplicated: Secondary | ICD-10-CM | POA: Insufficient documentation

## 2016-05-02 DIAGNOSIS — Z510 Encounter for antineoplastic radiation therapy: Secondary | ICD-10-CM | POA: Insufficient documentation

## 2016-05-02 DIAGNOSIS — C3490 Malignant neoplasm of unspecified part of unspecified bronchus or lung: Secondary | ICD-10-CM

## 2016-05-02 DIAGNOSIS — Z515 Encounter for palliative care: Secondary | ICD-10-CM | POA: Insufficient documentation

## 2016-05-02 DIAGNOSIS — C771 Secondary and unspecified malignant neoplasm of intrathoracic lymph nodes: Secondary | ICD-10-CM

## 2016-05-02 HISTORY — DX: Encounter for antineoplastic chemotherapy: Z51.11

## 2016-05-02 HISTORY — DX: Malignant neoplasm of unspecified part of left bronchus or lung: C34.92

## 2016-05-02 HISTORY — DX: Other specified counseling: Z71.89

## 2016-05-02 LAB — COMPREHENSIVE METABOLIC PANEL
ALBUMIN: 3.2 g/dL — AB (ref 3.5–5.0)
ALK PHOS: 72 U/L (ref 40–150)
ALT: <6 U/L (ref 0–55)
ANION GAP: 7 meq/L (ref 3–11)
AST: 12 U/L (ref 5–34)
BILIRUBIN TOTAL: 0.41 mg/dL (ref 0.20–1.20)
BUN: 10.5 mg/dL (ref 7.0–26.0)
CO2: 30 mEq/L — ABNORMAL HIGH (ref 22–29)
Calcium: 9.3 mg/dL (ref 8.4–10.4)
Chloride: 105 mEq/L (ref 98–109)
Creatinine: 0.8 mg/dL (ref 0.7–1.3)
Glucose: 101 mg/dl (ref 70–140)
POTASSIUM: 3.9 meq/L (ref 3.5–5.1)
Sodium: 141 mEq/L (ref 136–145)
TOTAL PROTEIN: 6.6 g/dL (ref 6.4–8.3)

## 2016-05-02 LAB — CBC WITH DIFFERENTIAL/PLATELET
BASO%: 0.7 % (ref 0.0–2.0)
BASOS ABS: 0 10*3/uL (ref 0.0–0.1)
EOS ABS: 0.4 10*3/uL (ref 0.0–0.5)
EOS%: 5.5 % (ref 0.0–7.0)
HCT: 46.7 % (ref 38.4–49.9)
HEMOGLOBIN: 15.8 g/dL (ref 13.0–17.1)
LYMPH%: 14.5 % (ref 14.0–49.0)
MCH: 31.1 pg (ref 27.2–33.4)
MCHC: 33.9 g/dL (ref 32.0–36.0)
MCV: 91.7 fL (ref 79.3–98.0)
MONO#: 0.7 10*3/uL (ref 0.1–0.9)
MONO%: 10.2 % (ref 0.0–14.0)
NEUT%: 69.1 % (ref 39.0–75.0)
NEUTROS ABS: 4.4 10*3/uL (ref 1.5–6.5)
PLATELETS: 338 10*3/uL (ref 140–400)
RBC: 5.1 10*6/uL (ref 4.20–5.82)
RDW: 13.1 % (ref 11.0–14.6)
WBC: 6.4 10*3/uL (ref 4.0–10.3)
lymph#: 0.9 10*3/uL (ref 0.9–3.3)

## 2016-05-02 MED ORDER — PROCHLORPERAZINE MALEATE 10 MG PO TABS: 10.0000 mg | ORAL_TABLET | Freq: Four times a day (QID) | ORAL | 0 refills | Status: DC | PRN

## 2016-05-02 MED ORDER — DEXAMETHASONE 4 MG PO TABS: ORAL_TABLET | ORAL | 1 refills | Status: DC

## 2016-05-02 MED ORDER — FOLIC ACID 1 MG PO TABS: 1.0000 mg | ORAL_TABLET | Freq: Every day | ORAL | 4 refills | Status: DC

## 2016-05-02 MED ORDER — CYANOCOBALAMIN 1000 MCG/ML IJ SOLN
1000.0000 ug | Freq: Once | INTRAMUSCULAR | Status: AC
  Administered 2016-05-02: 1000 ug via INTRAMUSCULAR

## 2016-05-02 MED ORDER — LIDOCAINE-PRILOCAINE 2.5-2.5 % EX CREA: 1.0000 "application " | TOPICAL_CREAM | CUTANEOUS | 0 refills | Status: DC | PRN

## 2016-05-02 NOTE — Progress Notes (Signed)
Oncology Nurse Navigator Documentation  Oncology Nurse Navigator Flowsheets 05/02/2016  Navigator Location [facility]  Navigator Encounter Type Clinic/MDC/spoke with patient today during thoracic clinic.  He is doing well without complaints.  I gave and explained information on lung cancer, support and resource at [facility], and next steps.   I also reached out to [facility] to call patient to see services and support to help patient.  I will also reach out to [facility] with an update.    Abnormal Finding Date 02/21/2016  Multidisiplinary Clinic Date 05/02/2016  Patient Visit Type MedOnc  Treatment Phase Pre-Tx/Tx Discussion  Barriers/Navigation Needs Education;Coordination of Care  Education Understanding Cancer/ Treatment Options;Newly Diagnosed Cancer Education  Interventions Coordination of Care;Education  Coordination of Care Other  Education Method Verbal;Written  Support Groups/Services [employer];Other  Acuity Level 2  Acuity Level 2 Educational needs;Referrals such as genetics, survivorship  Time Spent with Patient 45

## 2016-05-02 NOTE — Progress Notes (Signed)
[place] [phone]   Fax:(336) [phone] Multidisciplinary thoracic oncology clinic  CONSULT NOTE  REFERRING PHYSICIAN: Dr. Shelbie Hutching, [facility], [place]  REASON FOR CONSULTATION:  56 years old white male recently diagnosed with lung cancer.  HPI [name] is a 56 y.o. male with no significant past medical history except for hypertension and history of seizure more than 10 years ago probably was quite induced. The patient also has a long history of smoking. He mentions that he used to live in [place] state and in January 2018 he was complaining of worsening cough and shortness of breath. He was seen at the emergency department on 02/08/2016 and chest x-ray at that time showed suspicious left upper lobe lung mass. 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 On 03/19/2016 the patient underwent CT-guided core biopsy of the left upper lobe lung mass and the final pathology ([id number]) from [facility] showed moderate to poorly differentiated invasive adenocarcinoma. Molecular studies were performed and was negative for EGFR, ALK, ROS 1 and BRAF mutations. Marland KitchenPDL 1 expression was 10-20%. MRI of the brain on 03/06/2016 was negative for brain metastasis. A PET scan was performed on 03/29/2016 and it showed at least 2 areas of hypermetabolic masslike consolidation in the left lung consistent with the history of malignant neoplasm. There are metastatic mediastinal and bilateral hilar lymph nodes with possible metastatic supraclavicular lymph nodes. There was also hypermetabolic irregular septal thickening  throughout the left lung, better highly concerning for carcinomatosis. There was also interval development of septal thickening in the right upper lobe with at least one area of hypermetabolic activity also worrisome for carcinomatosis. The PET scan also showed hypermetabolic lytic lesions in L3 and L5 highly concerning for bone metastasis. In addition there was a small pleural effusion in the left lung base and a small pericardial effusion. The patient decided to return back to [facility] to receive treatment and to stay with his sister. When seen today he continues to complain of pain in the chest area, stomach, ribs bilaterally left more than right as well as pain on the mid back. He also has shortness breath with exertion and cough productive of yellow sputum. He has no hemoptysis. He also complains of weight loss of around 12 pounds in the last 4 months. He has occasional headache but no visual changes. He denied having any nausea, vomiting, diarrhea or constipation. Family history significant for father with cancer and mother with diabetes mellitus. The patient is single and has one son who lives in [place]. He lives with his sister in [place]. He used to work in [employer] and has asbestos exposure. He has a history of smoking 1.5 pack per day for around 42 years and unfortunately continues to smoke a few cigarettes every day. He denied having any history of alcohol or drug abuse.  HPI  Past Medical History:  Diagnosis Date  . Bronchitis due to tobacco use ([place])   . Cancer ([place])    Lung  . Recurrent upper respiratory infection (URI)   . Seizures ([place]) 05/2011   new onset  . Shortness of breath  Past Surgical History:  Procedure Laterality Date  . NO PAST SURGERIES      No family history on file.  Social History Social History  Substance Use Topics  . Smoking status: Current Some Day Smoker    Packs/day: 0.00    Years: 30.00    Types: Cigarettes  . Smokeless  tobacco: Never Used  . Alcohol use No     Comment: occasional    No Known Allergies  Current Outpatient Prescriptions  Medication Sig Dispense Refill  . Ipratropium-Albuterol (COMBIVENT) 20-100 MCG/ACT AERS respimat Inhale 1 puff into the lungs every 6 (six) hours. 1 Inhaler 0  . morphine (MSIR) 15 MG tablet Take 2 tablets (30 mg total) by mouth every 6 (six) hours as needed for severe pain. 20 tablet 0   No current facility-administered medications for this visit.     Review of Systems  Constitutional: positive for fatigue and weight loss Eyes: negative Ears, nose, mouth, throat, and face: negative Respiratory: positive for cough, dyspnea on exertion, pleurisy/chest pain and sputum Cardiovascular: negative Gastrointestinal: negative Genitourinary:negative Integument/breast: negative Hematologic/lymphatic: negative Musculoskeletal:negative Neurological: positive for headaches Behavioral/Psych: negative Endocrine: negative Allergic/Immunologic: negative  Physical Exam  YIR:SWNIO, healthy, no distress, well nourished, well developed and anxious SKIN: skin color, texture, turgor are normal, no rashes or significant lesions HEAD: Normocephalic, No masses, lesions, tenderness or abnormalities EYES: normal, PERRLA, Conjunctiva are pink and non-injected EARS: External ears normal, Canals clear OROPHARYNX:no exudate, no erythema and lips, buccal mucosa, and tongue normal  NECK: supple, no adenopathy, no JVD LYMPH:  no palpable lymphadenopathy, no hepatosplenomegaly LUNGS: clear to auscultation , and palpation HEART: regular rate & rhythm, no murmurs and no gallops ABDOMEN:abdomen soft, non-tender, normal bowel sounds and no masses or organomegaly BACK: Back symmetric, no curvature., No CVA tenderness EXTREMITIES:no joint deformities, effusion, or inflammation, no edema, no skin discoloration  NEURO: alert & oriented x 3 with fluent speech, no focal motor/sensory  deficits  PERFORMANCE STATUS: ECOG 1  LABORATORY DATA: Lab Results  Component Value Date   WBC 6.4 05/02/2016   HGB 15.8 05/02/2016   HCT 46.7 05/02/2016   MCV 91.7 05/02/2016   PLT 338 05/02/2016      Chemistry      Component Value Date/Time   NA 138 05/29/2011 0545   K 3.6 05/29/2011 0545   CL 103 05/29/2011 0545   CO2 27 05/29/2011 0545   [name] 14 05/29/2011 0545   CREATININE 1.04 05/29/2011 0545      Component Value Date/Time   CALCIUM 9.0 05/29/2011 0545   ALKPHOS 73 05/28/2011 1351   AST 27 05/28/2011 1351   ALT 15 05/28/2011 1351   BILITOT 0.6 05/28/2011 1351       RADIOGRAPHIC STUDIES: No results found.  ASSESSMENT: This is a very pleasant 56 years old African-American male recently diagnosed with a stage IV (T3, N3, M1c) non-small cell lung cancer, moderately to poorly differentiated adenocarcinoma diagnosed in February 2018 with negative EGFR, ALK, ROS1 and BRAF mutations, with PD L1 expression 15-20 %.    PLAN: I had a lengthy discussion with the patient today about his current disease is stage, prognosis and treatment options. Unfortunately don't have any imaging studies as a baseline before starting his treatment. I recommended for the patient to have repeat CT scan of the chest, abdomen and pelvis for restaging of his disease before starting treatment. I explained to the patient that he has incurable condition and the treatment will be of palliative nature. I discussed  with him the goals of care. I gave him the option of palliative care and hospice referral versus consideration of palliative systemic chemotherapy with carboplatin for AUC of 5, Alimta 500 MG/M2 and Avastin 15 MG/KG every 3 weeks. He is interested in proceeding with systemic chemotherapy. I discussed with the patient adverse effects of this treatment including but not limited to alopecia, myelosuppression, nausea and vomiting, peripheral neuropathy, liver or renal dysfunction in addition to the  adverse effect of Avastin including risk for pulmonary hemorrhage GI perforation, wound healing delay, hypertension and proteinuria. We will arrange for the patient to receive vitamin B 12 injection today. The patient would also receive prescription for Compazine 10 mg by mouth every 6 hours as needed for nausea, Decadron 4 mg by mouth twice a day, the day before, day of and day after the chemotherapy in addition to folic acid 1 mg by mouth daily. I will arrange for the patient to have a chemotherapy education class before starting the first dose of the chemotherapy. The patient would also see Dr. Tammi Klippel today for evaluation and consideration of palliative radiotherapy to the painful bone lesions. He was seen during the multidisciplinary thoracic oncology clinic today by medical oncology, radiation oncology, thoracic navigator and physical therapist. He is expected to start the first dose of his systemic chemotherapy on 05/20/2016 after completion of the palliative radiotherapy. He was advised to call immediately if he has any concerning symptoms in the interval. The patient voices understanding of current disease status and treatment options and is in agreement with the current care plan. All questions were answered. The patient knows to call the clinic with any problems, questions or concerns. We can certainly see the patient much sooner if necessary.  Thank you so much for allowing me to participate in the care of [name]. I will continue to follow up the patient with you and assist in his care.  I spent 55 minutes counseling the patient face to face. The total time spent in the appointment was 80 minutes.  Disclaimer: This note was dictated with voice recognition software. Similar sounding words can inadvertently be transcribed and may not be corrected upon review.   Navin Dogan K. May 02, 2016, 3:56 PM

## 2016-05-02 NOTE — Progress Notes (Signed)
Radiation Oncology         [phone] ________________________________  Multidisciplinary Thoracic Oncology Clinic [facility]) Initial Outpatient Consultation  Name: [name] MRN: [id number]  Date: 05/02/2016  DOB: January 02, 1961  CC:No PCP Per Patient  No ref. provider found   REFERRING PHYSICIAN: No ref. provider found   DIAGNOSIS: The encounter diagnosis was Adenocarcinoma of left lung, stage 4 ([place]).    ICD-9-CM ICD-10-CM   1. Adenocarcinoma of left lung, stage 4 (HCC) 162.9 C34.92     HISTORY OF PRESENT ILLNESS::[name] is a 56 y.o. male with a new diagnosis of Stage IV metastatic lung cancer.  He had recently moved to [place] to live with his brother.  While there, he developed SOB and cough which prompted evaluation at the local ER on 02/08/16. A CT chest was obtained at that time for further evaluation and revealed a left upper lobe mass measuring 6.8 x 4.5 cm with diffuse mediastinal lymphadenopathy.   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  A PET CT scan was performed on 03/28/2016 revealing a 6.8 x 6.6 cm left upper lobe mass with SUV max of 10, left pulmonary hilar mass measuring 2.9 x 3.9 cm with SUV max of 9.2, posterior right upper lobe nodule measuring 1.3 cm with SUV max of 2.2 ( new from prior CT chest 01/2016).  There were at least 2 hypermetabolic right paratracheal lymph nodes with SUV max of 6.7, left hilar adenopathy with SUV max of 10.5 and right hilar adenopathy with SUV max 6.5. Also noted were hypermetabolic lytic lesions in L3 and L5 with SUV max of 3.4 and 2.4 respectively, concerning for bony metastasis.  An MRI of the brain was performed on 03/06/2016 and was negative for evidence of metastatic disease.   The patient was referred today for presentation in the multidisciplinary conference.  Radiology  studies and pathology slides were presented there for review and discussion of treatment options.  A consensus was discussed regarding potential next steps.  PREVIOUS RADIATION THERAPY: No  PAST MEDICAL HISTORY:  has a past medical history of Adenocarcinoma of left lung, stage 4 ([place]) (05/02/2016); Bronchitis due to tobacco use ([place]); Cancer ([place]); Encounter for antineoplastic chemotherapy (05/02/2016); Goals of care, counseling/discussion (05/02/2016); Recurrent upper respiratory infection (URI); Seizures ([place]) (05/2011); and Shortness of breath.    PAST SURGICAL HISTORY: Past Surgical History:  Procedure Laterality Date  . NO PAST SURGERIES      FAMILY HISTORY: family history is not on file.  SOCIAL HISTORY:  reports that he has been smoking Cigarettes.  He has been smoking about 0.00 packs per day for the past 30.00 years. He has never used smokeless tobacco. He reports that he does not drink alcohol or use drugs.  ALLERGIES: Patient has no known allergies.  MEDICATIONS:  Current Outpatient Prescriptions  Medication Sig Dispense Refill  . dexamethasone (DECADRON) 4 MG tablet 4 mg by mouth twice a day the day before, day of and day after the chemotherapy every 3 weeks 40 tablet 1  . folic acid (FOLVITE) 1 MG tablet Take 1 tablet (1 mg total) by mouth daily. 30 tablet 4  . Ipratropium-Albuterol (COMBIVENT) 20-100 MCG/ACT AERS respimat Inhale 1 puff into the lungs every 6 (six) hours. (Patient not taking: Reported on 05/02/2016) 1 Inhaler 0  . lidocaine-prilocaine (EMLA) cream Apply 1 application topically as needed. 30 g 0  .  morphine (MSIR) 15 MG tablet Take 2 tablets (30 mg total) by mouth every 6 (six) hours as needed for severe pain. 20 tablet 0  . prochlorperazine (COMPAZINE) 10 MG tablet Take 1 tablet (10 mg total) by mouth every 6 (six) hours as needed for nausea or vomiting. 30 tablet 0   No current facility-administered medications for this encounter.     REVIEW OF SYSTEMS:  On  review of systems, the patient reports that he is doing well overall. He reports shortness of breath with exertion. He reports a non-productive cough and denies hemoptysis. He does have dyspnea on exertion which has progressively worsened over the past 3 months.  He reports upper mid-back, chest, and abdominal pain. The back pain feels as though it wraps around to his chest and ribs. He reports discomfort when swallowing liquids and with belching. He also reports pain when he bends over or takes a deep breath. He is currently using oral morphine which helps to manage the pain. He reports about 10 lbs weight loss since Dec. 2017. He denies any fevers, chills, night sweats. He denies any bowel or bladder disturbances, and denies nausea or vomiting. He denies swelling of the lower extremities. He denies any difficulty walking or numbness/timgling in the LEs. A complete review of systems is obtained and is otherwise negative.   PHYSICAL EXAM:  Vitals with BMI 05/02/2016  Height '5\' 11"'$   Weight 134 lbs 2 oz  BMI 54.0  Systolic 981  Diastolic 93  Pulse 78  Respirations    In general this is a well appearing African American male in no acute distress. He is alert and oriented x4 and appropriate throughout the examination. HEENT reveals that the patient is normocephalic, atraumatic. EOMs are intact. PERRLA. Skin is intact without any evidence of gross lesions. Cardiovascular exam reveals a regular rate and rhythm, no clicks rubs or murmurs are auscultated. Chest is clear to auscultation bilaterally with deceased breath sounds in the LUL and inspiratory wheezing on the left. The patient localizes pain to T6 and proximal rib area. Lymphatic assessment is performed and does not reveal any adenopathy in the cervical, supraclavicular, axillary, or inguinal chains. Abdomen has active bowel sounds in all quadrants and is intact. The abdomen is soft, non tender, non distended. Lower extremities are negative for pretibial  pitting edema, deep calf tenderness, cyanosis or clubbing.   KPS = 80  100 - Normal; no complaints; no evidence of disease. 90   - Able to carry on normal activity; minor signs or symptoms of disease. 80   - Normal activity with effort; some signs or symptoms of disease. 102   - Cares for self; unable to carry on normal activity or to do active work. 60   - Requires occasional assistance, but is able to care for most of his personal needs. 50   - Requires considerable assistance and frequent medical care. 46   - Disabled; requires special care and assistance. 29   - Severely disabled; hospital admission is indicated although death not imminent. 48   - Very sick; hospital admission necessary; active supportive treatment necessary. 10   - Moribund; fatal processes progressing rapidly. 0     - Dead  Karnofsky DA, Abelmann Acampo, Craver LS and [facility] [phone]) The use of the nitrogen mustards in the palliative treatment of carcinoma: with particular reference to bronchogenic carcinoma Cancer 1 634-56  LABORATORY DATA:  Lab Results  Component Value Date   WBC 6.4 05/02/2016  HGB 15.8 05/02/2016   HCT 46.7 05/02/2016   MCV 91.7 05/02/2016   PLT 338 05/02/2016   Lab Results  Component Value Date   NA 141 05/02/2016   K 3.9 05/02/2016   CL 103 05/29/2011   CO2 30 (H) 05/02/2016   Lab Results  Component Value Date   ALT <6 05/02/2016   AST 12 05/02/2016   ALKPHOS 72 05/02/2016   BILITOT 0.41 05/02/2016    PULMONARY FUNCTION TEST:   Recent Review Flowsheet Data    There is no flowsheet data to display.      RADIOGRAPHY: No results found.    IMPRESSION/PLAN:  1. 56 y.o. 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 Today, we talked to the patient about the findings and work-up thus far.  We discussed the natural history of metastatic lung cancer and general treatment, highlighting the role of palliative  radiotherapy in the management of painful bony metastasis.  We discussed the available radiation techniques, and focused on the details of logistics and delivery.  We reviewed the anticipated acute and late sequelae associated with radiation in this setting.  The patient was encouraged to ask questions that we answered to the best of our ability. We anticipate a course of 8 treatments of palliative radiotherapy. The patient would like to proceed with radiotherapy and will be scheduled for CT simulation on Monday, April 16th in preparation to begin treatments on Wednesday, April 18th. The patient localizes pain to T6 and proximal rib area.  We spent 40 minutes minutes face to face with the patient and more than 50% of that time was spent in counseling and/or coordination of care.     Nicholos Johns, PA-C    Tyler Pita, MD  [facility] Direct Dial: [phone]  Fax: [phone] [email or web address]  Skype  [email or web address]  This document serves as a record of services personally performed by Tyler Pita, MD and Freeman Caldron, PA-C. It was created on their behalf by Arlyce Harman, a trained medical scribe. The creation of this record is based on the scribe's personal observations and the provider's statements to them. This document has been checked and approved by the attending provider.

## 2016-05-02 NOTE — Progress Notes (Signed)
START ON PATHWAY REGIMEN - Non-Small Cell Lung     A cycle is every 21 days:     Carboplatin      Pemetrexed      Bevacizumab   **Always confirm dose/schedule in your pharmacy ordering system**    Patient Characteristics: Stage IV Metastatic, Non Squamous, Initial Chemotherapy/Immunotherapy, PS = 0, 1, PD-L1 Expression Positive 1-49% (TPS) / Negative / Not Tested / Not a Candidate for Immunotherapy AJCC T Category: T3 Current Disease Status: Distant Metastases AJCC N Category: N2 AJCC M Category: M1c AJCC 8 Stage Grouping: IVB Histology: Non Squamous Cell ROS1 Rearrangement Status: Negative T790M Mutation Status: Not Applicable - EGFR Mutation Negative/Unknown Other Mutations/Biomarkers: No Other Actionable Mutations PD-L1 Expression Status: PD-L1 Positive 1-49% (TPS) Chemotherapy/Immunotherapy LOT: Initial Chemotherapy/Immunotherapy Molecular Targeted Therapy: Not Appropriate ALK Translocation Status: Negative Would you be surprised if this patient died  in the next year? I would NOT be surprised if this patient died in the next year EGFR Mutation Status: Negative/Wild Type BRAF V600E Mutation Status: Negative Performance Status: PS = 0, 1  Intent of Therapy: Non-Curative / Palliative Intent, Discussed with Patient

## 2016-05-03 ENCOUNTER — Telehealth: Payer: Self-pay | Admitting: Medical Oncology

## 2016-05-03 ENCOUNTER — Other Ambulatory Visit: Payer: Self-pay | Admitting: Medical Oncology

## 2016-05-03 ENCOUNTER — Telehealth: Payer: Self-pay | Admitting: Internal Medicine

## 2016-05-03 NOTE — Telephone Encounter (Signed)
Trying to contact pt to get specific address of his [facility] ( he said [address] -but does not exist)

## 2016-05-03 NOTE — Telephone Encounter (Signed)
Scheduled appt per 4/12 los. Patient is aware of new appts and will pick up schedule when he comes to chemo edu . Class.

## 2016-05-07 ENCOUNTER — Encounter: Payer: Self-pay | Admitting: Internal Medicine

## 2016-05-07 NOTE — Progress Notes (Signed)
Received email regarding patient needing assistance with medications. Called patient to introduce myself as his Arboriculturist. Asked patient if he has applied for Medicaid. Patient states he was receiving Medicaid in [place] and recently applied here on Tuesday of last week. Asked patient about his income and he states he has none and lives with his sister. Patient will come in to meet me in the morning after his CT scan to complete paperwork for [facility] grant to help get his medications. Advised patient to ask for me at the front desk. Patient verbalized understanding.

## 2016-05-08 ENCOUNTER — Encounter (HOSPITAL_COMMUNITY): Payer: Self-pay

## 2016-05-08 ENCOUNTER — Ambulatory Visit (HOSPITAL_COMMUNITY)
Admission: RE | Admit: 2016-05-08 | Discharge: 2016-05-08 | Disposition: A | Discharge status: Home / Self Care | Payer: Medicaid Other | Source: Ambulatory Visit | Attending: Internal Medicine | Admitting: Internal Medicine

## 2016-05-08 ENCOUNTER — Other Ambulatory Visit: Payer: Self-pay | Admitting: *Deleted

## 2016-05-08 ENCOUNTER — Encounter: Payer: Self-pay | Admitting: Internal Medicine

## 2016-05-08 ENCOUNTER — Ambulatory Visit: Payer: Self-pay

## 2016-05-08 ENCOUNTER — Other Ambulatory Visit: Payer: Self-pay | Admitting: Internal Medicine

## 2016-05-08 DIAGNOSIS — J439 Emphysema, unspecified: Secondary | ICD-10-CM | POA: Diagnosis not present

## 2016-05-08 DIAGNOSIS — R590 Localized enlarged lymph nodes: Secondary | ICD-10-CM | POA: Insufficient documentation

## 2016-05-08 DIAGNOSIS — C7951 Secondary malignant neoplasm of bone: Secondary | ICD-10-CM | POA: Diagnosis not present

## 2016-05-08 DIAGNOSIS — Z5111 Encounter for antineoplastic chemotherapy: Secondary | ICD-10-CM

## 2016-05-08 DIAGNOSIS — J90 Pleural effusion, not elsewhere classified: Secondary | ICD-10-CM | POA: Diagnosis not present

## 2016-05-08 DIAGNOSIS — C7800 Secondary malignant neoplasm of unspecified lung: Secondary | ICD-10-CM | POA: Diagnosis not present

## 2016-05-08 DIAGNOSIS — Z7189 Other specified counseling: Secondary | ICD-10-CM

## 2016-05-08 DIAGNOSIS — C3492 Malignant neoplasm of unspecified part of left bronchus or lung: Secondary | ICD-10-CM

## 2016-05-08 MED ORDER — LIDOCAINE-PRILOCAINE 2.5-2.5 % EX CREA: 1.0000 "application " | TOPICAL_CREAM | CUTANEOUS | 0 refills | Status: DC | PRN

## 2016-05-08 MED ORDER — FOLIC ACID 1 MG PO TABS: 1.0000 mg | ORAL_TABLET | Freq: Every day | ORAL | 4 refills | Status: DC

## 2016-05-08 MED ORDER — FOLIC ACID 1 MG PO TABS
1.0000 mg | ORAL_TABLET | Freq: Every day | ORAL | 4 refills | Status: DC
Start: 2016-05-08 — End: 2016-05-08

## 2016-05-08 MED ORDER — IOPAMIDOL (ISOVUE-300) INJECTION 61%
INTRAVENOUS | Status: AC
  Administered 2016-05-08: 100 mL via INTRAVENOUS
  Filled 2016-05-08: qty 100

## 2016-05-08 MED ORDER — PROCHLORPERAZINE MALEATE 10 MG PO TABS: 10.0000 mg | ORAL_TABLET | Freq: Four times a day (QID) | ORAL | 0 refills | Status: DC | PRN

## 2016-05-08 MED ORDER — DEXAMETHASONE 4 MG PO TABS: ORAL_TABLET | ORAL | 1 refills | Status: DC

## 2016-05-08 MED FILL — PROCHLORPERAZINE 10 MG TAB: 10 | 7 days supply | Qty: 30 | Fill #0

## 2016-05-08 MED FILL — DEXAMETHASONE 4 MG TABLET: 4 | 84 days supply | Qty: 24 | Fill #0

## 2016-05-08 MED FILL — LIDOCAINE-PRILOCAINE CREAM: 2.5-2.5 | 30 days supply | Qty: 30 | Fill #0

## 2016-05-08 MED FILL — FOLIC ACID 1 MG TABLET: 1 | 30 days supply | Qty: 30 | Fill #0

## 2016-05-08 NOTE — Progress Notes (Signed)
Met with patient in office to have him sign for [place] approval to get his medications, Gave patient a copy of the approval letter and explained the funds would be for medications due to being uninsured while waiting on a decision from his Medicaid. Patient verbalized understanding.  Patient states he catches the bus. Went to speak with physician to have prescriptions sent to Outpatient pharmacy so that patient could get them today. This was completed. Came back and walked patient to pharmacy and advised staff he was waiting. Showed patient where the closes bus stop was. Patient has my name and number for any additional financial questions or concerns.

## 2016-05-09 ENCOUNTER — Other Ambulatory Visit: Payer: Self-pay | Admitting: Radiology

## 2016-05-13 ENCOUNTER — Encounter (HOSPITAL_COMMUNITY): Payer: Self-pay

## 2016-05-13 ENCOUNTER — Other Ambulatory Visit: Payer: Self-pay | Admitting: Internal Medicine

## 2016-05-13 ENCOUNTER — Encounter: Payer: Self-pay | Admitting: Radiation Oncology

## 2016-05-13 ENCOUNTER — Other Ambulatory Visit: Payer: Self-pay

## 2016-05-13 ENCOUNTER — Ambulatory Visit (HOSPITAL_COMMUNITY)
Admission: RE | Admit: 2016-05-13 | Discharge: 2016-05-13 | Disposition: A | Discharge status: Home / Self Care | Payer: Medicaid Other | Source: Ambulatory Visit | Attending: Internal Medicine | Admitting: Internal Medicine

## 2016-05-13 ENCOUNTER — Ambulatory Visit
Admission: RE | Admit: 2016-05-13 | Discharge: 2016-05-13 | Disposition: A | Discharge status: Home / Self Care | Payer: Medicaid Other | Source: Ambulatory Visit | Attending: Radiation Oncology | Admitting: Radiation Oncology

## 2016-05-13 DIAGNOSIS — C7951 Secondary malignant neoplasm of bone: Secondary | ICD-10-CM | POA: Diagnosis not present

## 2016-05-13 DIAGNOSIS — Z7189 Other specified counseling: Secondary | ICD-10-CM

## 2016-05-13 DIAGNOSIS — F17210 Nicotine dependence, cigarettes, uncomplicated: Secondary | ICD-10-CM | POA: Insufficient documentation

## 2016-05-13 DIAGNOSIS — Z79899 Other long term (current) drug therapy: Secondary | ICD-10-CM | POA: Diagnosis not present

## 2016-05-13 DIAGNOSIS — Z510 Encounter for antineoplastic radiation therapy: Secondary | ICD-10-CM | POA: Diagnosis not present

## 2016-05-13 DIAGNOSIS — C3492 Malignant neoplasm of unspecified part of left bronchus or lung: Secondary | ICD-10-CM

## 2016-05-13 DIAGNOSIS — Z5111 Encounter for antineoplastic chemotherapy: Secondary | ICD-10-CM

## 2016-05-13 HISTORY — PX: IR FLUORO GUIDE PORT INSERTION RIGHT: IMG5741

## 2016-05-13 HISTORY — PX: IR US GUIDE VASC ACCESS RIGHT: IMG2390

## 2016-05-13 LAB — CBC WITH DIFFERENTIAL/PLATELET
BASOS ABS: 0 10*3/uL (ref 0.0–0.1)
Basophils Relative: 0 %
Eosinophils Absolute: 0 10*3/uL (ref 0.0–0.7)
Eosinophils Relative: 0 %
HEMATOCRIT: 46.5 % (ref 39.0–52.0)
HEMOGLOBIN: 16.2 g/dL (ref 13.0–17.0)
LYMPHS ABS: 0.6 10*3/uL — AB (ref 0.7–4.0)
LYMPHS PCT: 5 %
MCH: 31.4 pg (ref 26.0–34.0)
MCHC: 34.8 g/dL (ref 30.0–36.0)
MCV: 90.1 fL (ref 78.0–100.0)
Monocytes Absolute: 0.1 10*3/uL (ref 0.1–1.0)
Monocytes Relative: 1 %
NEUTROS ABS: 10.4 10*3/uL — AB (ref 1.7–7.7)
Neutrophils Relative %: 94 %
Platelets: 378 10*3/uL (ref 150–400)
RBC: 5.16 MIL/uL (ref 4.22–5.81)
RDW: 12.9 % (ref 11.5–15.5)
WBC: 11 10*3/uL — ABNORMAL HIGH (ref 4.0–10.5)

## 2016-05-13 LAB — PROTIME-INR
INR: 0.99
Prothrombin Time: 13.1 seconds (ref 11.4–15.2)

## 2016-05-13 MED ORDER — LIDOCAINE HCL 1 % IJ SOLN
INTRAMUSCULAR | Status: AC | PRN
  Administered 2016-05-13: 20 mL

## 2016-05-13 MED ORDER — CEFAZOLIN SODIUM-DEXTROSE 2-4 GM/100ML-% IV SOLN
2.0000 g | INTRAVENOUS | Status: AC
  Administered 2016-05-13: 2 g via INTRAVENOUS

## 2016-05-13 MED ORDER — FENTANYL CITRATE (PF) 100 MCG/2ML IJ SOLN
INTRAMUSCULAR | Status: AC | PRN
  Administered 2016-05-13 (×2): 50 ug via INTRAVENOUS

## 2016-05-13 MED ORDER — HEPARIN SOD (PORK) LOCK FLUSH 100 UNIT/ML IV SOLN
INTRAVENOUS | Status: AC
  Filled 2016-05-13: qty 5

## 2016-05-13 MED ORDER — SODIUM CHLORIDE 0.9 % IV SOLN
INTRAVENOUS | Status: DC
  Administered 2016-05-13: 14:00:00 via INTRAVENOUS

## 2016-05-13 MED ORDER — CEFAZOLIN SODIUM-DEXTROSE 2-4 GM/100ML-% IV SOLN
INTRAVENOUS | Status: AC
  Administered 2016-05-13: 2 g via INTRAVENOUS
  Filled 2016-05-13: qty 100

## 2016-05-13 MED ORDER — MIDAZOLAM HCL 2 MG/2ML IJ SOLN
INTRAMUSCULAR | Status: AC | PRN
  Administered 2016-05-13 (×3): 1 mg via INTRAVENOUS

## 2016-05-13 MED ORDER — LIDOCAINE HCL 1 % IJ SOLN
INTRAMUSCULAR | Status: AC
  Filled 2016-05-13: qty 20

## 2016-05-13 MED ORDER — FENTANYL CITRATE (PF) 100 MCG/2ML IJ SOLN
INTRAMUSCULAR | Status: AC
  Filled 2016-05-13: qty 4

## 2016-05-13 MED ORDER — MIDAZOLAM HCL 2 MG/2ML IJ SOLN
INTRAMUSCULAR | Status: AC
  Filled 2016-05-13: qty 6

## 2016-05-13 MED ORDER — HEPARIN SOD (PORK) LOCK FLUSH 100 UNIT/ML IV SOLN
INTRAVENOUS | Status: AC | PRN
  Administered 2016-05-13: 500 [IU]

## 2016-05-13 NOTE — Sedation Documentation (Signed)
Oral suction, pt coughing a lot

## 2016-05-13 NOTE — Sedation Documentation (Signed)
Patient is resting comfortably. 

## 2016-05-13 NOTE — Progress Notes (Signed)
  Radiation Oncology         [phone] ________________________________  Name: MANSA WILLERS MRN: [id number]  Date: 05/13/2016  DOB: [birth year]  SIMULATION AND TREATMENT PLANNING NOTE    ICD-9-CM ICD-10-CM   1. Spine metastasis ([place]) 198.5 C79.51     DIAGNOSIS:  56 y.o. 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  Identity was confirmed.  All relevant records and images related to the planned course of therapy were reviewed.  The patient freely provided informed written consent to proceed with treatment after reviewing the details related to the planned course of therapy. The consent form was witnessed and verified by the simulation staff.  Then, the patient was set-up in a stable reproducible  supine position for radiation therapy.  CT images were obtained.  Surface markings were placed.  The CT images were loaded into the planning software.  Then the target and avoidance structures were contoured.  Treatment planning then occurred.  The radiation prescription was entered and confirmed.  Then, I designed and supervised the construction of a total of 3 medically necessary complex treatment devices with BodyFix and 2 MLCs.  I have requested : 3D Simulation  I have requested a DVH of the following structures: Lt Kidney, Rt Kidney, and target lesions.  PLAN:  The patient will receive 30 Gy in 10 fractions.  ________________________________  Sheral Apley Tammi Klippel, M.D.
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
## 2016-05-13 NOTE — Progress Notes (Signed)
Paperwork received 4/23, given to nurse

## 2016-05-13 NOTE — Procedures (Signed)
RIJV PAC SVC RA EBL 0 Comp 0 

## 2016-05-13 NOTE — Discharge Instructions (Signed)
Moderate Conscious Sedation, Adult, Care After °These instructions provide you with information about caring for yourself after your procedure. Your health care provider may also give you more specific instructions. Your treatment has been planned according to current medical practices, but problems sometimes occur. Call your health care provider if you have any problems or questions after your procedure. °What can I expect after the procedure? °After your procedure, it is common: °· To feel sleepy for several hours. °· To feel clumsy and have poor balance for several hours. °· To have poor judgment for several hours. °· To vomit if you eat too soon. °Follow these instructions at home: °For at least 24 hours after the procedure:  ° °· Do not: °¨ Participate in activities where you could fall or become injured. °¨ Drive. °¨ Use heavy machinery. °¨ Drink alcohol. °¨ Take sleeping pills or medicines that cause drowsiness. °¨ Make important decisions or sign legal documents. °¨ Take care of children on your own. °· Rest. °Eating and drinking  °· Follow the diet recommended by your health care provider. °· If you vomit: °¨ Drink water, juice, or soup when you can drink without vomiting. °¨ Make sure you have little or no nausea before eating solid foods. °General instructions  °· Have a responsible adult stay with you until you are awake and alert. °· Take over-the-counter and prescription medicines only as told by your health care provider. °· If you smoke, do not smoke without supervision. °· Keep all follow-up visits as told by your health care provider. This is important. °Contact a health care provider if: °· You keep feeling nauseous or you keep vomiting. °· You feel light-headed. °· You develop a rash. °· You have a fever. °Get help right away if: °· You have trouble breathing. °This information is not intended to replace advice given to you by your health care provider. Make sure you discuss any questions you  have with your health care provider. °Document Released: 10/28/2012 Document Revised: 06/12/2015 Document Reviewed: 04/29/2015 °Elsevier Interactive Patient Education © 2017 [employer] ° ° °Implanted Port Insertion, Care After °This sheet gives you information about how to care for yourself after your procedure. Your health care provider may also give you more specific instructions. If you have problems or questions, contact your health care provider. °What can I expect after the procedure? °After your procedure, it is common to have: °· Discomfort at the port insertion site. °· Bruising on the skin over the port. This should improve over 3-4 days. °Follow these instructions at home: °Port care  °· After your port is placed, you will get a manufacturer's information card. The card has information about your port. Keep this card with you at all times. °· Take care of the port as told by your health care provider. Ask your health care provider if you or a family member can get training for taking care of the port at home. A home health care nurse may also take care of the port. °· Make sure to remember what type of port you have. °Incision care  °· Follow instructions from your health care provider about how to take care of your port insertion site. Make sure you: °¨ Wash your hands with soap and water before you change your bandage (dressing). If soap and water are not available, use hand sanitizer. °¨ Change your dressing as told by your health care provider. °¨ Leave stitches (sutures), skin glue, or adhesive strips in place. These skin   closures may need to stay in place for 2 weeks or longer. If adhesive strip edges start to loosen and curl up, you may trim the loose edges. Do not remove adhesive strips completely unless your health care provider tells you to do that. °· Check your port insertion site every day for signs of infection. Check for: °¨ More redness, swelling, or pain. °¨ More fluid or  blood. °¨ Warmth. °¨ Pus or a bad smell. °General instructions  °· Do not take baths, swim, or use a hot tub until your health care provider approves. °· Do not lift anything that is heavier than 10 lb (4.5 kg) for a week, or as told by your health care provider. °· Ask your health care provider when it is okay to: °¨ Return to work or school. °¨ Resume usual physical activities or sports. °· Do not drive for 24 hours if you were given a medicine to help you relax (sedative). °· Take over-the-counter and prescription medicines only as told by your health care provider. °· Wear a medical alert bracelet in case of an emergency. This will tell any health care providers that you have a port. °· Keep all follow-up visits as told by your health care provider. This is important. °Contact a health care provider if: °· You cannot flush your port with saline as directed, or you cannot draw blood from the port. °· You have a fever or chills. °· You have more redness, swelling, or pain around your port insertion site. °· You have more fluid or blood coming from your port insertion site. °· Your port insertion site feels warm to the touch. °· You have pus or a bad smell coming from the port insertion site. °Get help right away if: °· You have chest pain or shortness of breath. °· You have bleeding from your port that you cannot control. °Summary °· Take care of the port as told by your health care provider. °· Change your dressing as told by your health care provider. °· Keep all follow-up visits as told by your health care provider. °This information is not intended to replace advice given to you by your health care provider. Make sure you discuss any questions you have with your health care provider. °Document Released: 10/28/2012 Document Revised: 11/29/2015 Document Reviewed: 11/29/2015 °Elsevier Interactive Patient Education © 2017 [employer] ° °

## 2016-05-14 ENCOUNTER — Ambulatory Visit
Admission: RE | Admit: 2016-05-14 | Discharge: 2016-05-14 | Disposition: A | Discharge status: Home / Self Care | Payer: Medicaid Other | Source: Ambulatory Visit | Attending: Radiation Oncology | Admitting: Radiation Oncology

## 2016-05-14 DIAGNOSIS — Z510 Encounter for antineoplastic radiation therapy: Secondary | ICD-10-CM | POA: Diagnosis not present

## 2016-05-15 ENCOUNTER — Ambulatory Visit
Admission: RE | Admit: 2016-05-15 | Discharge: 2016-05-15 | Disposition: A | Discharge status: Home / Self Care | Payer: Medicaid Other | Source: Ambulatory Visit | Attending: Radiation Oncology | Admitting: Radiation Oncology

## 2016-05-15 DIAGNOSIS — Z510 Encounter for antineoplastic radiation therapy: Secondary | ICD-10-CM | POA: Diagnosis not present

## 2016-05-16 ENCOUNTER — Ambulatory Visit
Admission: RE | Admit: 2016-05-16 | Discharge: 2016-05-16 | Disposition: A | Discharge status: Home / Self Care | Payer: Medicaid Other | Source: Ambulatory Visit | Attending: Radiation Oncology | Admitting: Radiation Oncology

## 2016-05-16 ENCOUNTER — Encounter: Payer: Self-pay | Admitting: Radiation Oncology

## 2016-05-16 DIAGNOSIS — Z510 Encounter for antineoplastic radiation therapy: Secondary | ICD-10-CM | POA: Diagnosis not present

## 2016-05-16 DIAGNOSIS — C7951 Secondary malignant neoplasm of bone: Secondary | ICD-10-CM

## 2016-05-16 MED ORDER — MORPHINE SULFATE 4 MG/ML IJ SOLN
2.0000 mg | INTRAMUSCULAR | Status: AC
  Administered 2016-05-16: 2 mg via INTRAMUSCULAR
  Filled 2016-05-16: qty 1

## 2016-05-16 MED ORDER — MORPHINE SULFATE 15 MG PO TABS: 15.0000 mg | ORAL_TABLET | ORAL | 0 refills | Status: DC | PRN

## 2016-05-16 MED FILL — MORPHINE SULFATE IR 15 MG T: 15 | 5 days supply | Qty: 60 | Fill #0

## 2016-05-17 ENCOUNTER — Ambulatory Visit
Admission: RE | Admit: 2016-05-17 | Discharge: 2016-05-17 | Disposition: A | Discharge status: Home / Self Care | Payer: Medicaid Other | Source: Ambulatory Visit | Attending: Radiation Oncology | Admitting: Radiation Oncology

## 2016-05-17 DIAGNOSIS — Z510 Encounter for antineoplastic radiation therapy: Secondary | ICD-10-CM | POA: Diagnosis not present

## 2016-05-20 ENCOUNTER — Ambulatory Visit: Payer: Self-pay

## 2016-05-20 ENCOUNTER — Other Ambulatory Visit: Payer: Self-pay

## 2016-05-20 ENCOUNTER — Ambulatory Visit
Admission: RE | Admit: 2016-05-20 | Discharge: 2016-05-20 | Disposition: A | Discharge status: Home / Self Care | Payer: Medicaid Other | Source: Ambulatory Visit | Attending: Radiation Oncology | Admitting: Radiation Oncology

## 2016-05-20 DIAGNOSIS — Z510 Encounter for antineoplastic radiation therapy: Secondary | ICD-10-CM | POA: Diagnosis not present

## 2016-05-21 ENCOUNTER — Telehealth: Payer: Self-pay | Admitting: Internal Medicine

## 2016-05-21 ENCOUNTER — Ambulatory Visit
Admission: RE | Admit: 2016-05-21 | Discharge: 2016-05-21 | Disposition: A | Discharge status: Home / Self Care | Payer: Medicaid Other | Source: Ambulatory Visit | Attending: Radiation Oncology | Admitting: Radiation Oncology

## 2016-05-21 DIAGNOSIS — Z510 Encounter for antineoplastic radiation therapy: Secondary | ICD-10-CM | POA: Diagnosis not present

## 2016-05-21 NOTE — Telephone Encounter (Signed)
r/s missed appt from 4/30 - patient is aware of new appt date and time . Talked to patient and let him know he can come to scheduling after radiation today to pick up a new schedule.

## 2016-05-22 ENCOUNTER — Other Ambulatory Visit (HOSPITAL_BASED_OUTPATIENT_CLINIC_OR_DEPARTMENT_OTHER): Payer: Self-pay

## 2016-05-22 ENCOUNTER — Ambulatory Visit (HOSPITAL_BASED_OUTPATIENT_CLINIC_OR_DEPARTMENT_OTHER): Payer: Self-pay

## 2016-05-22 ENCOUNTER — Ambulatory Visit
Admission: RE | Admit: 2016-05-22 | Discharge: 2016-05-22 | Disposition: A | Discharge status: Home / Self Care | Payer: Medicaid Other | Source: Ambulatory Visit | Attending: Radiation Oncology | Admitting: Radiation Oncology

## 2016-05-22 VITALS — BP 122/74 | HR 95 | Temp 98.5°F | Resp 18

## 2016-05-22 DIAGNOSIS — C3412 Malignant neoplasm of upper lobe, left bronchus or lung: Secondary | ICD-10-CM

## 2016-05-22 DIAGNOSIS — C3492 Malignant neoplasm of unspecified part of left bronchus or lung: Secondary | ICD-10-CM

## 2016-05-22 DIAGNOSIS — Z510 Encounter for antineoplastic radiation therapy: Secondary | ICD-10-CM | POA: Diagnosis not present

## 2016-05-22 DIAGNOSIS — Z5112 Encounter for antineoplastic immunotherapy: Secondary | ICD-10-CM

## 2016-05-22 DIAGNOSIS — C771 Secondary and unspecified malignant neoplasm of intrathoracic lymph nodes: Secondary | ICD-10-CM

## 2016-05-22 DIAGNOSIS — Z5111 Encounter for antineoplastic chemotherapy: Secondary | ICD-10-CM

## 2016-05-22 LAB — COMPREHENSIVE METABOLIC PANEL
ALK PHOS: 79 U/L (ref 40–150)
ALT: 7 U/L (ref 0–55)
AST: 11 U/L (ref 5–34)
Albumin: 3.6 g/dL (ref 3.5–5.0)
Anion Gap: 10 mEq/L (ref 3–11)
BILIRUBIN TOTAL: 0.65 mg/dL (ref 0.20–1.20)
BUN: 10.9 mg/dL (ref 7.0–26.0)
CO2: 30 meq/L — AB (ref 22–29)
Calcium: 9.8 mg/dL (ref 8.4–10.4)
Chloride: 101 mEq/L (ref 98–109)
Creatinine: 0.7 mg/dL (ref 0.7–1.3)
GLUCOSE: 128 mg/dL (ref 70–140)
Potassium: 4.1 mEq/L (ref 3.5–5.1)
SODIUM: 141 meq/L (ref 136–145)
TOTAL PROTEIN: 7.4 g/dL (ref 6.4–8.3)

## 2016-05-22 LAB — CBC WITH DIFFERENTIAL/PLATELET
BASO%: 0.4 % (ref 0.0–2.0)
Basophils Absolute: 0 10*3/uL (ref 0.0–0.1)
EOS ABS: 0 10*3/uL (ref 0.0–0.5)
EOS%: 0.3 % (ref 0.0–7.0)
HCT: 45.9 % (ref 38.4–49.9)
HGB: 15.5 g/dL (ref 13.0–17.1)
LYMPH%: 2.3 % — AB (ref 14.0–49.0)
MCH: 31 pg (ref 27.2–33.4)
MCHC: 33.7 g/dL (ref 32.0–36.0)
MCV: 92 fL (ref 79.3–98.0)
MONO#: 0.4 10*3/uL (ref 0.1–0.9)
MONO%: 3.5 % (ref 0.0–14.0)
NEUT%: 93.5 % — ABNORMAL HIGH (ref 39.0–75.0)
NEUTROS ABS: 10.1 10*3/uL — AB (ref 1.5–6.5)
Platelets: 268 10*3/uL (ref 140–400)
RBC: 4.99 10*6/uL (ref 4.20–5.82)
RDW: 13.2 % (ref 11.0–14.6)
WBC: 10.8 10*3/uL — AB (ref 4.0–10.3)
lymph#: 0.2 10*3/uL — ABNORMAL LOW (ref 0.9–3.3)

## 2016-05-22 LAB — UA PROTEIN, DIPSTICK - CHCC: Protein, ur: NEGATIVE mg/dL

## 2016-05-22 MED ORDER — PALONOSETRON HCL INJECTION 0.25 MG/5ML
0.2500 mg | Freq: Once | INTRAVENOUS | Status: AC
  Administered 2016-05-22: 0.25 mg via INTRAVENOUS

## 2016-05-22 MED ORDER — SODIUM CHLORIDE 0.9 % IV SOLN
15.0000 mg/kg | Freq: Once | INTRAVENOUS | Status: AC
  Administered 2016-05-22: 900 mg via INTRAVENOUS
  Filled 2016-05-22: qty 32

## 2016-05-22 MED ORDER — SODIUM CHLORIDE 0.9 % IV SOLN
Freq: Once | INTRAVENOUS | Status: AC
  Administered 2016-05-22: 14:00:00 via INTRAVENOUS

## 2016-05-22 MED ORDER — FOSAPREPITANT DIMEGLUMINE INJECTION 150 MG
Freq: Once | INTRAVENOUS | Status: AC
  Administered 2016-05-22: 14:00:00 via INTRAVENOUS
  Filled 2016-05-22: qty 5

## 2016-05-22 MED ORDER — PALONOSETRON HCL INJECTION 0.25 MG/5ML
INTRAVENOUS | Status: AC
  Filled 2016-05-22: qty 5

## 2016-05-22 MED ORDER — SODIUM CHLORIDE 0.9 % IV SOLN
568.5000 mg | Freq: Once | INTRAVENOUS | Status: AC
  Administered 2016-05-22: 570 mg via INTRAVENOUS
  Filled 2016-05-22: qty 57

## 2016-05-22 MED ORDER — PEMETREXED DISODIUM CHEMO INJECTION 500 MG
508.0000 mg/m2 | Freq: Once | INTRAVENOUS | Status: AC
  Administered 2016-05-22: 900 mg via INTRAVENOUS
  Filled 2016-05-22: qty 20

## 2016-05-22 NOTE — Patient Instructions (Addendum)
[facility] Discharge Instructions for Patients Receiving Chemotherapy  Today you received the following chemotherapy agents Avastin, Alimta, Carboplatin  To help prevent nausea and vomiting after your treatment, we encourage you to take your nausea medication    If you develop nausea and vomiting that is not controlled by your nausea medication, call the clinic.   BELOW ARE SYMPTOMS THAT SHOULD BE REPORTED IMMEDIATELY:  *FEVER GREATER THAN 100.5 F  *CHILLS WITH OR WITHOUT FEVER  NAUSEA AND VOMITING THAT IS NOT CONTROLLED WITH YOUR NAUSEA MEDICATION  *UNUSUAL SHORTNESS OF BREATH  *UNUSUAL BRUISING OR BLEEDING  TENDERNESS IN MOUTH AND THROAT WITH OR WITHOUT PRESENCE OF ULCERS  *URINARY PROBLEMS  *BOWEL PROBLEMS  UNUSUAL RASH Items with * indicate a potential emergency and should be followed up as soon as possible.  Feel free to call the clinic you have any questions or concerns. The clinic phone number is [phone].  Please show the [place] at check-in to the Emergency Department and triage nurse.  Carboplatin injection What is this medicine? CARBOPLATIN (KAR boe pla tin) is a chemotherapy drug. It targets fast dividing cells, like cancer cells, and causes these cells to die. This medicine is used to treat ovarian cancer and many other cancers. This medicine may be used for other purposes; ask your health care provider or pharmacist if you have questions. COMMON BRAND NAME(S): Paraplatin What should I tell my health care provider before I take this medicine? They need to know if you have any of these conditions: -blood disorders -hearing problems -kidney disease -recent or ongoing radiation therapy -an unusual or allergic reaction to carboplatin, cisplatin, other chemotherapy, other medicines, foods, dyes, or preservatives -pregnant or trying to get pregnant -breast-feeding How should I use this medicine? This drug is usually given as an  infusion into a vein. It is administered in a hospital or clinic by a specially trained health care professional. Talk to your pediatrician regarding the use of this medicine in children. Special care may be needed. Overdosage: If you think you have taken too much of this medicine contact a poison control center or emergency room at once. NOTE: This medicine is only for you. Do not share this medicine with others. What if I miss a dose? It is important not to miss a dose. Call your doctor or health care professional if you are unable to keep an appointment. What may interact with this medicine? -medicines for seizures -medicines to increase blood counts like filgrastim, pegfilgrastim, sargramostim -some antibiotics like amikacin, gentamicin, neomycin, streptomycin, tobramycin -vaccines Talk to your doctor or health care professional before taking any of these medicines: -acetaminophen -aspirin -ibuprofen -ketoprofen -naproxen This list may not describe all possible interactions. Give your health care provider a list of all the medicines, herbs, non-prescription drugs, or dietary supplements you use. Also tell them if you smoke, drink alcohol, or use illegal drugs. Some items may interact with your medicine. What should I watch for while using this medicine? Your condition will be monitored carefully while you are receiving this medicine. You will need important blood work done while you are taking this medicine. This drug may make you feel generally unwell. This is not uncommon, as chemotherapy can affect healthy cells as well as cancer cells. Report any side effects. Continue your course of treatment even though you feel ill unless your doctor tells you to stop. In some cases, you may be given additional medicines to help with side effects. Follow all  directions for their use. Call your doctor or health care professional for advice if you get a fever, chills or sore throat, or other symptoms  of a cold or flu. Do not treat yourself. This drug decreases your body's ability to fight infections. Try to avoid being around people who are sick. This medicine may increase your risk to bruise or bleed. Call your doctor or health care professional if you notice any unusual bleeding. Be careful brushing and flossing your teeth or using a toothpick because you may get an infection or bleed more easily. If you have any dental work done, tell your dentist you are receiving this medicine. Avoid taking products that contain aspirin, acetaminophen, ibuprofen, naproxen, or ketoprofen unless instructed by your doctor. These medicines may hide a fever. Do not become pregnant while taking this medicine. Women should inform their doctor if they wish to become pregnant or think they might be pregnant. There is a potential for serious side effects to an unborn child. Talk to your health care professional or pharmacist for more information. Do not breast-feed an infant while taking this medicine. What side effects may I notice from receiving this medicine? Side effects that you should report to your doctor or health care professional as soon as possible: -allergic reactions like skin rash, itching or hives, swelling of the face, lips, or tongue -signs of infection - fever or chills, cough, sore throat, pain or difficulty passing urine -signs of decreased platelets or bleeding - bruising, pinpoint red spots on the skin, black, tarry stools, nosebleeds -signs of decreased red blood cells - unusually weak or tired, fainting spells, lightheadedness -breathing problems -changes in hearing -changes in vision -chest pain -high blood pressure -low blood counts - This drug may decrease the number of white blood cells, red blood cells and platelets. You may be at increased risk for infections and bleeding. -nausea and vomiting -pain, swelling, redness or irritation at the injection site -pain, tingling, numbness in the  hands or feet -problems with balance, talking, walking -trouble passing urine or change in the amount of urine Side effects that usually do not require medical attention (report to your doctor or health care professional if they continue or are bothersome): -hair loss -loss of appetite -metallic taste in the mouth or changes in taste This list may not describe all possible side effects. Call your doctor for medical advice about side effects. You may report side effects to FDA at 1-800-FDA-1088. Where should I keep my medicine? This drug is given in a hospital or clinic and will not be stored at home. NOTE: This sheet is a summary. It may not cover all possible information. If you have questions about this medicine, talk to your doctor, pharmacist, or health care provider.  2018 Elsevier/Gold Standard (2007-04-14 14:38:05)  Bevacizumab injection What is this medicine? BEVACIZUMAB (be va SIZ yoo mab) is a monoclonal antibody. It is used to treat many types of cancer. This medicine may be used for other purposes; ask your health care provider or pharmacist if you have questions. COMMON BRAND NAME(S): Avastin What should I tell my health care provider before I take this medicine? They need to know if you have any of these conditions: -diabetes -heart disease -high blood pressure -history of coughing up blood -prior anthracycline chemotherapy (e.g., doxorubicin, daunorubicin, epirubicin) -recent or ongoing radiation therapy -recent or planning to have surgery -stroke -an unusual or allergic reaction to bevacizumab, hamster proteins, mouse proteins, other medicines, foods, dyes, or preservatives -pregnant  or trying to get pregnant -breast-feeding How should I use this medicine? This medicine is for infusion into a vein. It is given by a health care professional in a hospital or clinic setting. Talk to your pediatrician regarding the use of this medicine in children. Special care may be  needed. Overdosage: If you think you have taken too much of this medicine contact a poison control center or emergency room at once. NOTE: This medicine is only for you. Do not share this medicine with others. What if I miss a dose? It is important not to miss your dose. Call your doctor or health care professional if you are unable to keep an appointment. What may interact with this medicine? Interactions are not expected. This list may not describe all possible interactions. Give your health care provider a list of all the medicines, herbs, non-prescription drugs, or dietary supplements you use. Also tell them if you smoke, drink alcohol, or use illegal drugs. Some items may interact with your medicine. What should I watch for while using this medicine? Your condition will be monitored carefully while you are receiving this medicine. You will need important blood work and urine testing done while you are taking this medicine. This medicine may increase your risk to bruise or bleed. Call your doctor or health care professional if you notice any unusual bleeding. This medicine should be started at least 28 days following major surgery and the site of the surgery should be totally healed. Check with your doctor before scheduling dental work or surgery while you are receiving this treatment. Talk to your doctor if you have recently had surgery or if you have a wound that has not healed. Do not become pregnant while taking this medicine or for 6 months after stopping it. Women should inform their doctor if they wish to become pregnant or think they might be pregnant. There is a potential for serious side effects to an unborn child. Talk to your health care professional or pharmacist for more information. Do not breast-feed an infant while taking this medicine and for 6 months after the last dose. This medicine has caused ovarian failure in some women. This medicine may interfere with the ability to have a  child. You should talk to your doctor or health care professional if you are concerned about your fertility. What side effects may I notice from receiving this medicine? Side effects that you should report to your doctor or health care professional as soon as possible: -allergic reactions like skin rash, itching or hives, swelling of the face, lips, or tongue -chest pain or chest tightness -chills -coughing up blood -high fever -seizures -severe constipation -signs and symptoms of bleeding such as bloody or black, tarry stools; red or dark-brown urine; spitting up blood or brown material that looks like coffee grounds; red spots on the skin; unusual bruising or bleeding from the eye, gums, or nose -signs and symptoms of a blood clot such as breathing problems; chest pain; severe, sudden headache; pain, swelling, warmth in the leg -signs and symptoms of a stroke like changes in vision; confusion; trouble speaking or understanding; severe headaches; sudden numbness or weakness of the face, arm or leg; trouble walking; dizziness; loss of balance or coordination -stomach pain -sweating -swelling of legs or ankles -vomiting -weight gain Side effects that usually do not require medical attention (report to your doctor or health care professional if they continue or are bothersome): -back pain -changes in taste -decreased appetite -dry skin -nausea -  tiredness This list may not describe all possible side effects. Call your doctor for medical advice about side effects. You may report side effects to FDA at 1-800-FDA-1088. Where should I keep my medicine? This drug is given in a hospital or clinic and will not be stored at home. NOTE: This sheet is a summary. It may not cover all possible information. If you have questions about this medicine, talk to your doctor, pharmacist, or health care provider.  2018 Elsevier/Gold Standard (2016-01-05 14:33:29)  Pemetrexed injection What is this  medicine? PEMETREXED (PEM e TREX ed) is a chemotherapy drug used to treat lung cancers like non-small cell lung cancer and mesothelioma. It may also be used to treat other cancers. This medicine may be used for other purposes; ask your health care provider or pharmacist if you have questions. COMMON BRAND NAME(S): Alimta What should I tell my health care provider before I take this medicine? They need to know if you have any of these conditions: -infection (especially a virus infection such as chickenpox, cold sores, or herpes) -kidney disease -low blood counts, like low white cell, platelet, or red cell counts -lung or breathing disease, like asthma -radiation therapy -an unusual or allergic reaction to pemetrexed, other medicines, foods, dyes, or preservative -pregnant or trying to get pregnant -breast-feeding How should I use this medicine? This drug is given as an infusion into a vein. It is administered in a hospital or clinic by a specially trained health care professional. Talk to your pediatrician regarding the use of this medicine in children. Special care may be needed. Overdosage: If you think you have taken too much of this medicine contact a poison control center or emergency room at once. NOTE: This medicine is only for you. Do not share this medicine with others. What if I miss a dose? It is important not to miss your dose. Call your doctor or health care professional if you are unable to keep an appointment. What may interact with this medicine? This medicine may interact with the following medications: -Ibuprofen This list may not describe all possible interactions. Give your health care provider a list of all the medicines, herbs, non-prescription drugs, or dietary supplements you use. Also tell them if you smoke, drink alcohol, or use illegal drugs. Some items may interact with your medicine. What should I watch for while using this medicine? Visit your doctor for checks  on your progress. This drug may make you feel generally unwell. This is not uncommon, as chemotherapy can affect healthy cells as well as cancer cells. Report any side effects. Continue your course of treatment even though you feel ill unless your doctor tells you to stop. In some cases, you may be given additional medicines to help with side effects. Follow all directions for their use. Call your doctor or health care professional for advice if you get a fever, chills or sore throat, or other symptoms of a cold or flu. Do not treat yourself. This drug decreases your body's ability to fight infections. Try to avoid being around people who are sick. This medicine may increase your risk to bruise or bleed. Call your doctor or health care professional if you notice any unusual bleeding. Be careful brushing and flossing your teeth or using a toothpick because you may get an infection or bleed more easily. If you have any dental work done, tell your dentist you are receiving this medicine. Avoid taking products that contain aspirin, acetaminophen, ibuprofen, naproxen, or ketoprofen unless instructed  by your doctor. These medicines may hide a fever. Call your doctor or health care professional if you get diarrhea or mouth sores. Do not treat yourself. To protect your kidneys, drink water or other fluids as directed while you are taking this medicine. Do not become pregnant while taking this medicine or for 6 months after stopping it. Women should inform their doctor if they wish to become pregnant or think they might be pregnant. Men should not father a child while taking this medicine and for 3 months after stopping it. This may interfere with the ability to father a child. You should talk to your doctor or health care professional if you are concerned about your fertility. There is a potential for serious side effects to an unborn child. Talk to your health care professional or pharmacist for more information.  Do not breast-feed an infant while taking this medicine or for 1 week after stopping it. What side effects may I notice from receiving this medicine? Side effects that you should report to your doctor or health care professional as soon as possible: -allergic reactions like skin rash, itching or hives, swelling of the face, lips, or tongue -breathing problems -redness, blistering, peeling or loosening of the skin, including inside the mouth -signs and symptoms of bleeding such as bloody or black, tarry stools; red or dark-brown urine; spitting up blood or brown material that looks like coffee grounds; red spots on the skin; unusual bruising or bleeding from the eye, gums, or nose -signs and symptoms of infection like fever or chills; cough; sore throat; pain or trouble passing urine -signs and symptoms of kidney injury like trouble passing urine or change in the amount of urine -signs and symptoms of liver injury like dark yellow or brown urine; general ill feeling or flu-like symptoms; light-colored stools; loss of appetite; nausea; right upper belly pain; unusually weak or tired; yellowing of the eyes or skin Side effects that usually do not require medical attention (report to your doctor or health care professional if they continue or are bothersome): -constipation -dizziness -mouth sores -nausea, vomiting -pain, tingling, numbness in the hands or feet -unusually weak or tired This list may not describe all possible side effects. Call your doctor for medical advice about side effects. You may report side effects to FDA at 1-800-FDA-1088. Where should I keep my medicine? This drug is given in a hospital or clinic and will not be stored at home. NOTE: This sheet is a summary. It may not cover all possible information. If you have questions about this medicine, talk to your doctor, pharmacist, or health care provider.  2018 Elsevier/Gold Standard (2015-11-07 18:51:46)

## 2016-05-23 ENCOUNTER — Ambulatory Visit
Admission: RE | Admit: 2016-05-23 | Discharge: 2016-05-23 | Disposition: A | Discharge status: Home / Self Care | Payer: Medicaid Other | Source: Ambulatory Visit | Attending: Radiation Oncology | Admitting: Radiation Oncology

## 2016-05-23 DIAGNOSIS — Z510 Encounter for antineoplastic radiation therapy: Secondary | ICD-10-CM | POA: Diagnosis not present

## 2016-05-24 ENCOUNTER — Ambulatory Visit
Admission: RE | Admit: 2016-05-24 | Discharge: 2016-05-24 | Disposition: A | Discharge status: Home / Self Care | Payer: Medicaid Other | Source: Ambulatory Visit | Attending: Radiation Oncology | Admitting: Radiation Oncology

## 2016-05-24 DIAGNOSIS — Z510 Encounter for antineoplastic radiation therapy: Secondary | ICD-10-CM | POA: Diagnosis not present

## 2016-05-27 ENCOUNTER — Telehealth: Payer: Self-pay | Admitting: Radiation Oncology

## 2016-05-27 ENCOUNTER — Encounter: Payer: Self-pay | Admitting: Radiation Oncology

## 2016-05-27 ENCOUNTER — Ambulatory Visit
Admission: RE | Admit: 2016-05-27 | Discharge: 2016-05-27 | Disposition: A | Discharge status: Home / Self Care | Payer: Medicaid Other | Source: Ambulatory Visit | Attending: Radiation Oncology | Admitting: Radiation Oncology

## 2016-05-27 ENCOUNTER — Ambulatory Visit: Payer: Self-pay | Admitting: Nutrition

## 2016-05-27 VITALS — Temp 98.2°F | Resp 20 | Wt 118.2 lb

## 2016-05-27 DIAGNOSIS — Z510 Encounter for antineoplastic radiation therapy: Secondary | ICD-10-CM | POA: Diagnosis not present

## 2016-05-27 DIAGNOSIS — C3492 Malignant neoplasm of unspecified part of left bronchus or lung: Secondary | ICD-10-CM

## 2016-05-27 NOTE — Progress Notes (Signed)
56 year old male diagnosed with metastatic lung cancer.  He is a patient of [name] and Dr. Tammi Klippel.  Past medical history includes SOB, seizures, hypertension, tobacco, asbestos exposure  Medications include Decadron, Folvite, Compazine.  Labs include glucose 101, and albumin 3.2 on April 12.  Height: 5 feet 11 inches. Weight: 120.4 pounds. Usual body weight: 142 pounds. BMI: 16.79.  Patient endorses 22 lb. weight loss secondary to poor appetite and poor intake. Patient reports nausea and vomiting. His bowels are moving without difficulty. He enjoys ensure plus.  Nutrition diagnosis:  Severe malnutrition in acute illness secondary to inadequate oral intake with metastatic lung cancer as evidenced by 15% weight loss and depletion of body mass and fat stores.  Intervention: Educated patient to increase calories and protein in small frequent meals and snacks. Recommended patient consume Ensure Plus 3 times a day between meals. Provided 2 complementary cases of Ensure Plus. Educated patient on strategies for improving nausea and vomiting. Fact sheets were provided.  Questions were answered.  Teach back method used.  Contact information was given.  Monitoring, evaluation, goals: Patient will work to increase calories and protein to minimize weight loss.  Next visit: Monday, May 21.

## 2016-05-27 NOTE — Telephone Encounter (Signed)
Placed completed [facility] paperwork in [place], PA_C inbox to initial and sign.

## 2016-05-28 ENCOUNTER — Ambulatory Visit: Payer: Self-pay | Admitting: Internal Medicine

## 2016-05-28 ENCOUNTER — Other Ambulatory Visit: Payer: Self-pay

## 2016-05-28 ENCOUNTER — Telehealth: Payer: Self-pay | Admitting: *Deleted

## 2016-05-28 ENCOUNTER — Telehealth: Payer: Self-pay | Admitting: Medical Oncology

## 2016-05-28 NOTE — Telephone Encounter (Signed)
He missed his appointment today. He may need to be seen soon. If I have no availability by Friday, He needs to go to the ED for evaluation.

## 2016-05-28 NOTE — Telephone Encounter (Signed)
CALLED PATIENT TO INFORM OF APPT. WITH DR. Elsworth Soho ON 08-19-16- ARRIVAL TIME - 10:15 AM - ADDRESS - [address] - [phone], SPOKE WITH PATIENT AND HE IS AWARE OF THIS APPT.

## 2016-05-28 NOTE — Telephone Encounter (Signed)
States his stomach is "killing me , I am losing weight. I can't eat like a I need to". He says he is having some nausea and vomiting with  clear emesis and taking compazine ,  only works for about 2 hours. He continues taking his MSIR .  LBM yesterday. He also had cold like symptoms, including nasal stuffiness and sniffles. He denies fever. Last radiation yesterday.

## 2016-05-29 ENCOUNTER — Other Ambulatory Visit: Payer: Self-pay | Admitting: Medical Oncology

## 2016-05-29 ENCOUNTER — Telehealth: Payer: Self-pay | Admitting: Internal Medicine

## 2016-05-29 ENCOUNTER — Telehealth: Payer: Self-pay | Admitting: Medical Oncology

## 2016-05-29 DIAGNOSIS — C3492 Malignant neoplasm of unspecified part of left bronchus or lung: Secondary | ICD-10-CM

## 2016-05-29 NOTE — Telephone Encounter (Signed)
appt made for tomorrow

## 2016-05-29 NOTE — Telephone Encounter (Signed)
Scheduled appt per sch message from [facility] 5/9 -per message patient already notified.

## 2016-05-30 ENCOUNTER — Ambulatory Visit (HOSPITAL_BASED_OUTPATIENT_CLINIC_OR_DEPARTMENT_OTHER): Payer: Self-pay | Admitting: Internal Medicine

## 2016-05-30 ENCOUNTER — Other Ambulatory Visit (HOSPITAL_BASED_OUTPATIENT_CLINIC_OR_DEPARTMENT_OTHER): Payer: Self-pay

## 2016-05-30 ENCOUNTER — Encounter: Payer: Self-pay | Admitting: Radiation Oncology

## 2016-05-30 ENCOUNTER — Ambulatory Visit (HOSPITAL_BASED_OUTPATIENT_CLINIC_OR_DEPARTMENT_OTHER): Payer: Self-pay

## 2016-05-30 ENCOUNTER — Telehealth: Payer: Self-pay | Admitting: Internal Medicine

## 2016-05-30 VITALS — BP 116/74 | HR 85 | Temp 98.1°F | Resp 18 | Ht 71.0 in | Wt 117.6 lb

## 2016-05-30 DIAGNOSIS — C3492 Malignant neoplasm of unspecified part of left bronchus or lung: Secondary | ICD-10-CM

## 2016-05-30 DIAGNOSIS — C3412 Malignant neoplasm of upper lobe, left bronchus or lung: Secondary | ICD-10-CM

## 2016-05-30 DIAGNOSIS — Z5111 Encounter for antineoplastic chemotherapy: Secondary | ICD-10-CM

## 2016-05-30 DIAGNOSIS — R634 Abnormal weight loss: Secondary | ICD-10-CM

## 2016-05-30 DIAGNOSIS — R110 Nausea: Secondary | ICD-10-CM

## 2016-05-30 DIAGNOSIS — Z95828 Presence of other vascular implants and grafts: Secondary | ICD-10-CM

## 2016-05-30 DIAGNOSIS — C7951 Secondary malignant neoplasm of bone: Secondary | ICD-10-CM

## 2016-05-30 DIAGNOSIS — R1013 Epigastric pain: Secondary | ICD-10-CM

## 2016-05-30 LAB — CBC WITH DIFFERENTIAL/PLATELET
BASO%: 0.3 % (ref 0.0–2.0)
Basophils Absolute: 0 10*3/uL (ref 0.0–0.1)
EOS%: 6.3 % (ref 0.0–7.0)
Eosinophils Absolute: 0.2 10*3/uL (ref 0.0–0.5)
HCT: 46.3 % (ref 38.4–49.9)
HGB: 15.5 g/dL (ref 13.0–17.1)
LYMPH%: 8.7 % — AB (ref 14.0–49.0)
MCH: 30.7 pg (ref 27.2–33.4)
MCHC: 33.5 g/dL (ref 32.0–36.0)
MCV: 91.7 fL (ref 79.3–98.0)
MONO#: 0.2 10*3/uL (ref 0.1–0.9)
MONO%: 6.5 % (ref 0.0–14.0)
NEUT%: 78.2 % — AB (ref 39.0–75.0)
NEUTROS ABS: 2.3 10*3/uL (ref 1.5–6.5)
PLATELETS: 149 10*3/uL (ref 140–400)
RBC: 5.05 10*6/uL (ref 4.20–5.82)
RDW: 12.7 % (ref 11.0–14.6)
WBC: 3 10*3/uL — AB (ref 4.0–10.3)
lymph#: 0.3 10*3/uL — ABNORMAL LOW (ref 0.9–3.3)

## 2016-05-30 LAB — COMPREHENSIVE METABOLIC PANEL
ALT: 9 U/L (ref 0–55)
ANION GAP: 8 meq/L (ref 3–11)
AST: 12 U/L (ref 5–34)
Albumin: 3.4 g/dL — ABNORMAL LOW (ref 3.5–5.0)
Alkaline Phosphatase: 92 U/L (ref 40–150)
BUN: 13.1 mg/dL (ref 7.0–26.0)
CHLORIDE: 100 meq/L (ref 98–109)
CO2: 30 meq/L — AB (ref 22–29)
CREATININE: 0.7 mg/dL (ref 0.7–1.3)
Calcium: 9.4 mg/dL (ref 8.4–10.4)
GLUCOSE: 91 mg/dL (ref 70–140)
Potassium: 3.9 mEq/L (ref 3.5–5.1)
SODIUM: 138 meq/L (ref 136–145)
Total Bilirubin: 0.42 mg/dL (ref 0.20–1.20)
Total Protein: 7.3 g/dL (ref 6.4–8.3)

## 2016-05-30 MED ORDER — HEPARIN SOD (PORK) LOCK FLUSH 100 UNIT/ML IV SOLN
500.0000 [IU] | Freq: Once | INTRAVENOUS | Status: AC
  Administered 2016-05-30: 500 [IU] via INTRAVENOUS
  Filled 2016-05-30: qty 5

## 2016-05-30 MED ORDER — SODIUM CHLORIDE 0.9% FLUSH
10.0000 mL | INTRAVENOUS | Status: DC | PRN
  Administered 2016-05-30: 10 mL via INTRAVENOUS
  Filled 2016-05-30: qty 10

## 2016-05-30 NOTE — Patient Instructions (Signed)

## 2016-05-30 NOTE — Telephone Encounter (Signed)
Scheduled appt per 5/10 los. Gave patient AVS and calender per 5/10

## 2016-05-30 NOTE — Progress Notes (Signed)
Paperwork received from doctor, faxed to [employer] [phone], conf received, called pt but they hung up on me so copy being mailed 05/30/16

## 2016-05-30 NOTE — Progress Notes (Signed)
  Radiation Oncology         [phone]) [phone] ________________________________  Name: [name] MRN: [id number]  Date: 05/27/2016  DOB: 01/09/1961  End of Treatment Note  Diagnosis:   56 y.o. 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 He had a great deal of pain in his back/left side/chest rating 8 out of 10, he used his morphine 30 mg BID, he had a great deal of weight loss per our records, he had diarrhea and n/v, a productive cough, persistent shortness of breath and fatigue  Plan: The patient has completed radiation treatment. The patient will return to radiation oncology clinic for routine followup in one month. I advised him to call or return sooner if he has any questions or concerns related to his recovery or treatment. ________________________________  Sheral Apley. Tammi Klippel, M.D.   This document serves as a record of services personally performed by Tyler Pita, MD. It was created on his behalf by Darcus Austin, a trained medical scribe. The creation of this record is based on the scribe's personal observations and the provider's statements to them. This document has been checked and approved by the attending provider.
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
## 2016-05-31 ENCOUNTER — Encounter: Payer: Self-pay | Admitting: Internal Medicine

## 2016-05-31 ENCOUNTER — Telehealth: Payer: Self-pay | Admitting: Radiation Oncology

## 2016-05-31 NOTE — Telephone Encounter (Signed)
Spoke with patient and advised paperwork that was faxed yesterday was unable to be processed, they needed to verify his Medicaid was current, I explained to the patient and offered the number he could reach them at, he requested that I send him the notification that I received. Will put in the mail 05/31/16

## 2016-06-03 ENCOUNTER — Ambulatory Visit (HOSPITAL_BASED_OUTPATIENT_CLINIC_OR_DEPARTMENT_OTHER): Payer: Self-pay

## 2016-06-03 ENCOUNTER — Other Ambulatory Visit (HOSPITAL_BASED_OUTPATIENT_CLINIC_OR_DEPARTMENT_OTHER): Payer: Self-pay

## 2016-06-03 DIAGNOSIS — C3412 Malignant neoplasm of upper lobe, left bronchus or lung: Secondary | ICD-10-CM

## 2016-06-03 DIAGNOSIS — Z95828 Presence of other vascular implants and grafts: Secondary | ICD-10-CM

## 2016-06-03 DIAGNOSIS — C3492 Malignant neoplasm of unspecified part of left bronchus or lung: Secondary | ICD-10-CM

## 2016-06-03 LAB — COMPREHENSIVE METABOLIC PANEL
ALBUMIN: 3.3 g/dL — AB (ref 3.5–5.0)
ALK PHOS: 87 U/L (ref 40–150)
ALT: 9 U/L (ref 0–55)
ANION GAP: 9 meq/L (ref 3–11)
AST: 13 U/L (ref 5–34)
BILIRUBIN TOTAL: 0.44 mg/dL (ref 0.20–1.20)
BUN: 15.7 mg/dL (ref 7.0–26.0)
CALCIUM: 9.3 mg/dL (ref 8.4–10.4)
CHLORIDE: 98 meq/L (ref 98–109)
CO2: 31 mEq/L — ABNORMAL HIGH (ref 22–29)
CREATININE: 0.7 mg/dL (ref 0.7–1.3)
EGFR: >90 mL/min/{1.73_m2} (ref >90)
Glucose: 137 mg/dl (ref 70–140)
Potassium: 3.6 mEq/L (ref 3.5–5.1)
Sodium: 138 mEq/L (ref 136–145)
Total Protein: 7.1 g/dL (ref 6.4–8.3)

## 2016-06-03 LAB — CBC WITH DIFFERENTIAL/PLATELET
BASO%: 0.4 % (ref 0.0–2.0)
Basophils Absolute: 0 10*3/uL (ref 0.0–0.1)
EOS%: 13.4 % — ABNORMAL HIGH (ref 0.0–7.0)
Eosinophils Absolute: 0.3 10*3/uL (ref 0.0–0.5)
HEMATOCRIT: 44.2 % (ref 38.4–49.9)
HEMOGLOBIN: 15.1 g/dL (ref 13.0–17.1)
LYMPH#: 0.3 10*3/uL — AB (ref 0.9–3.3)
LYMPH%: 10.5 % — ABNORMAL LOW (ref 14.0–49.0)
MCH: 30.6 pg (ref 27.2–33.4)
MCHC: 34.2 g/dL (ref 32.0–36.0)
MCV: 89.7 fL (ref 79.3–98.0)
MONO#: 0.3 10*3/uL (ref 0.1–0.9)
MONO%: 13.9 % (ref 0.0–14.0)
NEUT#: 1.5 10*3/uL (ref 1.5–6.5)
NEUT%: 61.8 % (ref 39.0–75.0)
PLATELETS: 91 10*3/uL — AB (ref 140–400)
RBC: 4.93 10*6/uL (ref 4.20–5.82)
RDW: 12.4 % (ref 11.0–14.6)
WBC: 2.4 10*3/uL — ABNORMAL LOW (ref 4.0–10.3)

## 2016-06-03 MED ORDER — HEPARIN SOD (PORK) LOCK FLUSH 100 UNIT/ML IV SOLN
500.0000 [IU] | Freq: Once | INTRAVENOUS | Status: DC
  Filled 2016-06-03: qty 5

## 2016-06-03 MED ORDER — SODIUM CHLORIDE 0.9% FLUSH
10.0000 mL | INTRAVENOUS | Status: DC | PRN
  Administered 2016-06-03: 10 mL via INTRAVENOUS
  Filled 2016-06-03: qty 10

## 2016-06-03 NOTE — Patient Instructions (Signed)

## 2016-06-04 ENCOUNTER — Encounter: Payer: Self-pay | Admitting: Internal Medicine

## 2016-06-04 ENCOUNTER — Ambulatory Visit (HOSPITAL_BASED_OUTPATIENT_CLINIC_OR_DEPARTMENT_OTHER): Payer: Self-pay

## 2016-06-04 ENCOUNTER — Ambulatory Visit (HOSPITAL_COMMUNITY)
Admission: RE | Admit: 2016-06-04 | Discharge: 2016-06-04 | Disposition: A | Discharge status: Home / Self Care | Payer: Medicaid Other | Source: Ambulatory Visit | Attending: Internal Medicine | Admitting: Internal Medicine

## 2016-06-04 ENCOUNTER — Telehealth: Payer: Self-pay

## 2016-06-04 ENCOUNTER — Ambulatory Visit (HOSPITAL_BASED_OUTPATIENT_CLINIC_OR_DEPARTMENT_OTHER): Payer: Self-pay | Admitting: Internal Medicine

## 2016-06-04 VITALS — BP 136/78 | HR 86

## 2016-06-04 VITALS — BP 139/94 | HR 93 | Temp 98.0°F | Resp 20 | Ht 71.0 in | Wt 112.8 lb

## 2016-06-04 DIAGNOSIS — R634 Abnormal weight loss: Secondary | ICD-10-CM

## 2016-06-04 DIAGNOSIS — R109 Unspecified abdominal pain: Secondary | ICD-10-CM | POA: Insufficient documentation

## 2016-06-04 DIAGNOSIS — C7951 Secondary malignant neoplasm of bone: Secondary | ICD-10-CM

## 2016-06-04 DIAGNOSIS — R5383 Other fatigue: Secondary | ICD-10-CM

## 2016-06-04 DIAGNOSIS — E860 Dehydration: Secondary | ICD-10-CM | POA: Insufficient documentation

## 2016-06-04 DIAGNOSIS — R1110 Vomiting, unspecified: Secondary | ICD-10-CM | POA: Diagnosis not present

## 2016-06-04 DIAGNOSIS — R531 Weakness: Secondary | ICD-10-CM

## 2016-06-04 DIAGNOSIS — R112 Nausea with vomiting, unspecified: Secondary | ICD-10-CM

## 2016-06-04 DIAGNOSIS — E46 Unspecified protein-calorie malnutrition: Secondary | ICD-10-CM

## 2016-06-04 DIAGNOSIS — C3492 Malignant neoplasm of unspecified part of left bronchus or lung: Secondary | ICD-10-CM | POA: Diagnosis present

## 2016-06-04 DIAGNOSIS — R1010 Upper abdominal pain, unspecified: Secondary | ICD-10-CM | POA: Insufficient documentation

## 2016-06-04 DIAGNOSIS — R630 Anorexia: Secondary | ICD-10-CM

## 2016-06-04 HISTORY — DX: Dehydration: E86.0

## 2016-06-04 HISTORY — DX: Unspecified abdominal pain: R10.9

## 2016-06-04 MED ORDER — SODIUM CHLORIDE 0.9% FLUSH
10.0000 mL | INTRAVENOUS | Status: DC | PRN
  Administered 2016-06-04: 10 mL
  Filled 2016-06-04: qty 10

## 2016-06-04 MED ORDER — SODIUM CHLORIDE 0.9 % IV SOLN: Freq: Once | INTRAVENOUS | Status: DC

## 2016-06-04 MED ORDER — HEPARIN SOD (PORK) LOCK FLUSH 100 UNIT/ML IV SOLN
500.0000 [IU] | Freq: Once | INTRAVENOUS | Status: AC | PRN
  Administered 2016-06-04: 500 [IU]
  Filled 2016-06-04: qty 5

## 2016-06-04 MED ORDER — SODIUM CHLORIDE 0.9 % IV SOLN
Freq: Once | INTRAVENOUS | Status: AC
  Administered 2016-06-04: 16:00:00 via INTRAVENOUS

## 2016-06-04 MED ORDER — ONDANSETRON HCL 4 MG/2ML IJ SOLN
8.0000 mg | Freq: Once | INTRAMUSCULAR | Status: AC
Start: 2016-06-04 — End: 2016-06-04
  Administered 2016-06-04: 8 mg via INTRAVENOUS

## 2016-06-04 MED ORDER — ONDANSETRON HCL 4 MG/2ML IJ SOLN
INTRAMUSCULAR | Status: AC
  Filled 2016-06-04: qty 4
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
## 2016-06-04 NOTE — Patient Instructions (Signed)
Dehydration, Adult Dehydration is when there is not enough fluid or water in your body. This happens when you lose more fluids than you take in. Dehydration can range from mild to very bad. It should be treated right away to keep it from getting very bad. Symptoms of mild dehydration may include:   Thirst.  Dry lips.  Slightly dry mouth.  Dry, warm skin.  Dizziness. Symptoms of moderate dehydration may include:   Very dry mouth.  Muscle cramps.  Dark pee (urine). Pee may be the color of tea.  Your body making less pee.  Your eyes making fewer tears.  Heartbeat that is uneven or faster than normal (palpitations).  Headache.  Light-headedness, especially when you stand up from sitting.  Fainting (syncope). Symptoms of very bad dehydration may include:   Changes in skin, such as:  Cold and clammy skin.  Blotchy (mottled) or pale skin.  Skin that does not quickly return to normal after being lightly pinched and let go (poor skin turgor).  Changes in body fluids, such as:  Feeling very thirsty.  Your eyes making fewer tears.  Not sweating when body temperature is high, such as in hot weather.  Your body making very little pee.  Changes in vital signs, such as:  Weak pulse.  Pulse that is more than 100 beats a minute when you are sitting still.  Fast breathing.  Low blood pressure.  Other changes, such as:  Sunken eyes.  Cold hands and feet.  Confusion.  Lack of energy (lethargy).  Trouble waking up from sleep.  Short-term weight loss.  Unconsciousness. Follow these instructions at home:  If told by your doctor, drink an ORS:  Make an ORS by using instructions on the package.  Start by drinking small amounts, about  cup (120 mL) every 5-10 minutes.  Slowly drink more until you have had the amount that your doctor said to have.  Drink enough clear fluid to keep your pee clear or pale yellow. If you were told to drink an ORS, finish the  ORS first, then start slowly drinking clear fluids. Drink fluids such as:  Water. Do not drink only water by itself. Doing that can make the salt (sodium) level in your body get too low (hyponatremia).  Ice chips.  Fruit juice that you have added water to (diluted).  Low-calorie sports drinks.  Avoid:  Alcohol.  Drinks that have a lot of sugar. These include high-calorie sports drinks, fruit juice that does not have water added, and soda.  Caffeine.  Foods that are greasy or have a lot of fat or sugar.  Take over-the-counter and prescription medicines only as told by your doctor.  Do not take salt tablets. Doing that can make the salt level in your body get too high (hypernatremia).  Eat foods that have minerals (electrolytes). Examples include bananas, oranges, potatoes, tomatoes, and spinach.  Keep all follow-up visits as told by your doctor. This is important. Contact a doctor if:  You have belly (abdominal) pain that:  Gets worse.  Stays in one area (localizes).  You have a rash.  You have a stiff neck.  You get angry or annoyed more easily than normal (irritability).  You are more sleepy than normal.  You have a harder time waking up than normal.  You feel:  Weak.  Dizzy.  Very thirsty.  You have peed (urinated) only a small amount of very dark pee during 6-8 hours. Get help right away if:  You   have symptoms of very bad dehydration.  You cannot drink fluids without throwing up (vomiting).  Your symptoms get worse with treatment.  You have a fever.  You have a very bad headache.  You are throwing up or having watery poop (diarrhea) and it:  Gets worse.  Does not go away.  You have blood or something green (bile) in your throw-up.  You have blood in your poop (stool). This may cause poop to look black and tarry.  You have not peed in 6-8 hours.  You pass out (faint).  Your heart rate when you are sitting still is more than 100 beats a  minute.  You have trouble breathing. This information is not intended to replace advice given to you by your health care provider. Make sure you discuss any questions you have with your health care provider. Document Released: 11/03/2008 Document Revised: 07/28/2015 Document Reviewed: 03/03/2015 Elsevier Interactive Patient Education  2017 [employer]  

## 2016-06-04 NOTE — Telephone Encounter (Signed)
appt given for today 

## 2016-06-04 NOTE — Telephone Encounter (Signed)
Pt called stating he is feeling really rough. He can't eat and is losing weight. When tried to elicit more information about eating he just kept repeating "I just can't eat." This has been over the last month. He discussed with Dr [name] on visit on 5/10. Dr [name] instructed him to keep taking his meds. This is not helping. He mentioned his stomach is hurting. He stated ensure gives him diarrhea. Denies any breathing problems. He drank about 40 oz water yesterday. He feels like he needs to go to ER.

## 2016-06-07 ENCOUNTER — Telehealth: Payer: Self-pay | Admitting: Internal Medicine

## 2016-06-07 NOTE — Telephone Encounter (Signed)
Appts already scheduled per 5/15 los.

## 2016-06-10 ENCOUNTER — Other Ambulatory Visit (HOSPITAL_BASED_OUTPATIENT_CLINIC_OR_DEPARTMENT_OTHER): Payer: Self-pay

## 2016-06-10 ENCOUNTER — Ambulatory Visit (HOSPITAL_BASED_OUTPATIENT_CLINIC_OR_DEPARTMENT_OTHER): Payer: Self-pay | Admitting: Internal Medicine

## 2016-06-10 ENCOUNTER — Ambulatory Visit: Payer: Self-pay | Admitting: Nutrition

## 2016-06-10 ENCOUNTER — Ambulatory Visit (HOSPITAL_BASED_OUTPATIENT_CLINIC_OR_DEPARTMENT_OTHER): Payer: Self-pay

## 2016-06-10 ENCOUNTER — Ambulatory Visit: Payer: Self-pay

## 2016-06-10 ENCOUNTER — Encounter: Payer: Self-pay | Admitting: Internal Medicine

## 2016-06-10 ENCOUNTER — Telehealth: Payer: Self-pay | Admitting: Internal Medicine

## 2016-06-10 VITALS — BP 117/67 | HR 64 | Temp 98.0°F | Resp 18 | Ht 71.0 in | Wt 119.5 lb

## 2016-06-10 VITALS — BP 116/67 | HR 62

## 2016-06-10 DIAGNOSIS — Z5112 Encounter for antineoplastic immunotherapy: Secondary | ICD-10-CM

## 2016-06-10 DIAGNOSIS — E860 Dehydration: Secondary | ICD-10-CM

## 2016-06-10 DIAGNOSIS — C3492 Malignant neoplasm of unspecified part of left bronchus or lung: Secondary | ICD-10-CM

## 2016-06-10 DIAGNOSIS — Z5111 Encounter for antineoplastic chemotherapy: Secondary | ICD-10-CM

## 2016-06-10 DIAGNOSIS — C7951 Secondary malignant neoplasm of bone: Secondary | ICD-10-CM

## 2016-06-10 DIAGNOSIS — R1010 Upper abdominal pain, unspecified: Secondary | ICD-10-CM

## 2016-06-10 LAB — COMPREHENSIVE METABOLIC PANEL
ALT: 11 U/L (ref 0–55)
ANION GAP: 9 meq/L (ref 3–11)
AST: 11 U/L (ref 5–34)
Albumin: 3.1 g/dL — ABNORMAL LOW (ref 3.5–5.0)
Alkaline Phosphatase: 82 U/L (ref 40–150)
BUN: 9.8 mg/dL (ref 7.0–26.0)
CO2: 30 meq/L — AB (ref 22–29)
CREATININE: 0.7 mg/dL (ref 0.7–1.3)
Calcium: 9.2 mg/dL (ref 8.4–10.4)
Chloride: 101 mEq/L (ref 98–109)
EGFR: >90 mL/min/{1.73_m2} (ref >90)
GLUCOSE: 104 mg/dL (ref 70–140)
Potassium: 3.8 mEq/L (ref 3.5–5.1)
SODIUM: 141 meq/L (ref 136–145)
Total Bilirubin: 0.22 mg/dL (ref 0.20–1.20)
Total Protein: 6.5 g/dL (ref 6.4–8.3)

## 2016-06-10 LAB — CBC WITH DIFFERENTIAL/PLATELET
BASO%: 0.4 % (ref 0.0–2.0)
Basophils Absolute: 0 10*3/uL (ref 0.0–0.1)
EOS%: 4.9 % (ref 0.0–7.0)
Eosinophils Absolute: 0.1 10*3/uL (ref 0.0–0.5)
HCT: 39.7 % (ref 38.4–49.9)
HGB: 13.3 g/dL (ref 13.0–17.1)
LYMPH#: 0.2 10*3/uL — AB (ref 0.9–3.3)
LYMPH%: 8 % — AB (ref 14.0–49.0)
MCH: 30.4 pg (ref 27.2–33.4)
MCHC: 33.6 g/dL (ref 32.0–36.0)
MCV: 90.7 fL (ref 79.3–98.0)
MONO#: 0.6 10*3/uL (ref 0.1–0.9)
MONO%: 22.7 % — ABNORMAL HIGH (ref 0.0–14.0)
NEUT%: 64 % (ref 39.0–75.0)
NEUTROS ABS: 1.8 10*3/uL (ref 1.5–6.5)
PLATELETS: 377 10*3/uL (ref 140–400)
RBC: 4.37 10*6/uL (ref 4.20–5.82)
RDW: 13.1 % (ref 11.0–14.6)
WBC: 2.8 10*3/uL — AB (ref 4.0–10.3)

## 2016-06-10 LAB — UA PROTEIN, DIPSTICK - CHCC: Protein, ur: NEGATIVE mg/dL

## 2016-06-10 LAB — TECHNOLOGIST REVIEW: Technologist Review: 1

## 2016-06-10 MED ORDER — SODIUM CHLORIDE 0.9 % IV SOLN
Freq: Once | INTRAVENOUS | Status: AC
  Administered 2016-06-10: 11:00:00 via INTRAVENOUS

## 2016-06-10 MED ORDER — PALONOSETRON HCL INJECTION 0.25 MG/5ML
0.2500 mg | Freq: Once | INTRAVENOUS | Status: AC
  Administered 2016-06-10: 0.25 mg via INTRAVENOUS

## 2016-06-10 MED ORDER — SODIUM CHLORIDE 0.9 % IV SOLN
515.0000 mg/m2 | Freq: Once | INTRAVENOUS | Status: AC
  Administered 2016-06-10: 900 mg via INTRAVENOUS
  Filled 2016-06-10: qty 20

## 2016-06-10 MED ORDER — SODIUM CHLORIDE 0.9 % IV SOLN
15.0000 mg/kg | Freq: Once | INTRAVENOUS | Status: AC
  Administered 2016-06-10: 900 mg via INTRAVENOUS
  Filled 2016-06-10: qty 32

## 2016-06-10 MED ORDER — SODIUM CHLORIDE 0.9% FLUSH
10.0000 mL | INTRAVENOUS | Status: DC | PRN
  Administered 2016-06-10: 10 mL
  Filled 2016-06-10: qty 10

## 2016-06-10 MED ORDER — HEPARIN SOD (PORK) LOCK FLUSH 100 UNIT/ML IV SOLN
500.0000 [IU] | Freq: Once | INTRAVENOUS | Status: AC | PRN
  Administered 2016-06-10: 500 [IU]
  Filled 2016-06-10: qty 5

## 2016-06-10 MED ORDER — SODIUM CHLORIDE 0.9 % IV SOLN
Freq: Once | INTRAVENOUS | Status: AC
  Administered 2016-06-10: 12:00:00 via INTRAVENOUS
  Filled 2016-06-10: qty 5

## 2016-06-10 MED ORDER — SODIUM CHLORIDE 0.9 % IV SOLN
568.5000 mg | Freq: Once | INTRAVENOUS | Status: AC
  Administered 2016-06-10: 570 mg via INTRAVENOUS
  Filled 2016-06-10: qty 57

## 2016-06-10 MED ORDER — PALONOSETRON HCL INJECTION 0.25 MG/5ML
INTRAVENOUS | Status: AC
  Filled 2016-06-10: qty 5

## 2016-06-10 NOTE — Patient Instructions (Signed)
[facility] Discharge Instructions for Patients Receiving Chemotherapy  Today you received the following chemotherapy agents Alimta/Avastin/Carboplatin.   To help prevent nausea and vomiting after your treatment, we encourage you to take your nausea medication as directed.    If you develop nausea and vomiting that is not controlled by your nausea medication, call the clinic.   BELOW ARE SYMPTOMS THAT SHOULD BE REPORTED IMMEDIATELY:  *FEVER GREATER THAN 100.5 F  *CHILLS WITH OR WITHOUT FEVER  NAUSEA AND VOMITING THAT IS NOT CONTROLLED WITH YOUR NAUSEA MEDICATION  *UNUSUAL SHORTNESS OF BREATH  *UNUSUAL BRUISING OR BLEEDING  TENDERNESS IN MOUTH AND THROAT WITH OR WITHOUT PRESENCE OF ULCERS  *URINARY PROBLEMS  *BOWEL PROBLEMS  UNUSUAL RASH Items with * indicate a potential emergency and should be followed up as soon as possible.  Feel free to call the clinic you have any questions or concerns. The clinic phone number is [phone].  Please show the [facility] at check-in to the Emergency Department and triage nurse.
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
## 2016-06-10 NOTE — Telephone Encounter (Signed)
Scheduled appt per 5/21 los - patient aware of changes and will pick up new schedule next appt.

## 2016-06-10 NOTE — Progress Notes (Signed)
Nutrition follow-up completed with patient diagnosed with metastatic lung cancer. Patient reports his appetite has increased. Weight improved documented as 119.5 pounds, May 21 up from 112.8 pounds, May 15. Patient denies nutrition impact symptoms. He tries to drink Ensure Plus once daily. He denies nutrition needs at this time.  Nutrition diagnosis: Severe malnutrition.  Continues but has improved.  Intervention: Educated patient to continue increased calories and protein in meals and snacks daily. Recommended Ensure Plus twice a day between meals. Teach back method used.  Monitoring, evaluation, goals: Patient will work to continue increased calories and protein to minimize weight loss.  Next visit: Wednesday, June 13 during treatment.  **Disclaimer: This note was dictated with voice recognition software. Similar sounding words can inadvertently be transcribed and this note may contain transcription errors which may not have been corrected upon publication of note.**

## 2016-06-12 ENCOUNTER — Ambulatory Visit: Payer: Self-pay

## 2016-06-18 ENCOUNTER — Ambulatory Visit (HOSPITAL_BASED_OUTPATIENT_CLINIC_OR_DEPARTMENT_OTHER): Payer: Self-pay

## 2016-06-18 ENCOUNTER — Other Ambulatory Visit (HOSPITAL_BASED_OUTPATIENT_CLINIC_OR_DEPARTMENT_OTHER): Payer: Self-pay

## 2016-06-18 DIAGNOSIS — C3492 Malignant neoplasm of unspecified part of left bronchus or lung: Secondary | ICD-10-CM

## 2016-06-18 DIAGNOSIS — E860 Dehydration: Secondary | ICD-10-CM

## 2016-06-18 LAB — COMPREHENSIVE METABOLIC PANEL
ALT: 16 U/L (ref 0–55)
ANION GAP: 6 meq/L (ref 3–11)
AST: 15 U/L (ref 5–34)
Albumin: 3.2 g/dL — ABNORMAL LOW (ref 3.5–5.0)
Alkaline Phosphatase: 93 U/L (ref 40–150)
BILIRUBIN TOTAL: 0.23 mg/dL (ref 0.20–1.20)
BUN: 11.4 mg/dL (ref 7.0–26.0)
CALCIUM: 9.3 mg/dL (ref 8.4–10.4)
CO2: 29 mEq/L (ref 22–29)
CREATININE: 0.6 mg/dL — AB (ref 0.7–1.3)
Chloride: 104 mEq/L (ref 98–109)
EGFR: >90 mL/min/{1.73_m2} (ref >90)
Glucose: 93 mg/dl (ref 70–140)
Potassium: 3.9 mEq/L (ref 3.5–5.1)
Sodium: 139 mEq/L (ref 136–145)
Total Protein: 7 g/dL (ref 6.4–8.3)

## 2016-06-18 LAB — CBC WITH DIFFERENTIAL/PLATELET
BASO%: 0 % (ref 0.0–2.0)
Basophils Absolute: 0 10*3/uL (ref 0.0–0.1)
EOS%: 0.4 % (ref 0.0–7.0)
Eosinophils Absolute: 0 10*3/uL (ref 0.0–0.5)
HEMATOCRIT: 37.9 % — AB (ref 38.4–49.9)
HEMOGLOBIN: 12.8 g/dL — AB (ref 13.0–17.1)
LYMPH#: 0.3 10*3/uL — AB (ref 0.9–3.3)
LYMPH%: 13.1 % — ABNORMAL LOW (ref 14.0–49.0)
MCH: 30 pg (ref 27.2–33.4)
MCHC: 33.8 g/dL (ref 32.0–36.0)
MCV: 89 fL (ref 79.3–98.0)
MONO#: 0.2 10*3/uL (ref 0.1–0.9)
MONO%: 7 % (ref 0.0–14.0)
NEUT%: 79.5 % — AB (ref 39.0–75.0)
NEUTROS ABS: 1.8 10*3/uL (ref 1.5–6.5)
Platelets: 176 10*3/uL (ref 140–400)
RBC: 4.26 10*6/uL (ref 4.20–5.82)
RDW: 13.2 % (ref 11.0–14.6)
WBC: 2.3 10*3/uL — ABNORMAL LOW (ref 4.0–10.3)
nRBC: 0 % (ref 0–0)

## 2016-06-18 MED ORDER — SODIUM CHLORIDE 0.9% FLUSH
10.0000 mL | INTRAVENOUS | Status: DC | PRN
  Administered 2016-06-18: 10 mL
  Filled 2016-06-18: qty 10

## 2016-06-18 MED ORDER — HEPARIN SOD (PORK) LOCK FLUSH 100 UNIT/ML IV SOLN
500.0000 [IU] | Freq: Once | INTRAVENOUS | Status: DC | PRN
  Filled 2016-06-18: qty 5

## 2016-06-18 MED FILL — FOLIC ACID 1 MG TABLET: 1 | 30 days supply | Qty: 30 | Fill #1

## 2016-06-18 MED FILL — DEXAMETHASONE 4 MG TABLET: 4 | 84 days supply | Qty: 24 | Fill #1

## 2016-06-18 NOTE — Patient Instructions (Signed)

## 2016-06-24 ENCOUNTER — Other Ambulatory Visit (HOSPITAL_BASED_OUTPATIENT_CLINIC_OR_DEPARTMENT_OTHER): Payer: Self-pay

## 2016-06-24 ENCOUNTER — Ambulatory Visit (HOSPITAL_BASED_OUTPATIENT_CLINIC_OR_DEPARTMENT_OTHER): Payer: Self-pay

## 2016-06-24 DIAGNOSIS — C3492 Malignant neoplasm of unspecified part of left bronchus or lung: Secondary | ICD-10-CM

## 2016-06-24 DIAGNOSIS — E860 Dehydration: Secondary | ICD-10-CM

## 2016-06-24 LAB — COMPREHENSIVE METABOLIC PANEL
ALT: 33 U/L (ref 0–55)
AST: 24 U/L (ref 5–34)
Albumin: 3.3 g/dL — ABNORMAL LOW (ref 3.5–5.0)
Alkaline Phosphatase: 90 U/L (ref 40–150)
Anion Gap: 9 mEq/L (ref 3–11)
BUN: 8.9 mg/dL (ref 7.0–26.0)
CHLORIDE: 102 meq/L (ref 98–109)
CO2: 30 meq/L — AB (ref 22–29)
Calcium: 9.6 mg/dL (ref 8.4–10.4)
Creatinine: 0.7 mg/dL (ref 0.7–1.3)
EGFR: >90 mL/min/{1.73_m2} (ref >90)
GLUCOSE: 108 mg/dL (ref 70–140)
Potassium: 3.7 mEq/L (ref 3.5–5.1)
SODIUM: 141 meq/L (ref 136–145)
TOTAL PROTEIN: 6.8 g/dL (ref 6.4–8.3)

## 2016-06-24 LAB — CBC WITH DIFFERENTIAL/PLATELET
BASO%: 0.1 % (ref 0.0–2.0)
Basophils Absolute: 0 10*3/uL (ref 0.0–0.1)
EOS%: 0.2 % (ref 0.0–7.0)
Eosinophils Absolute: 0 10*3/uL (ref 0.0–0.5)
HCT: 35 % — ABNORMAL LOW (ref 38.4–49.9)
HGB: 11.8 g/dL — ABNORMAL LOW (ref 13.0–17.1)
LYMPH%: 5.8 % — AB (ref 14.0–49.0)
MCH: 30.4 pg (ref 27.2–33.4)
MCHC: 33.8 g/dL (ref 32.0–36.0)
MCV: 89.9 fL (ref 79.3–98.0)
MONO#: 0.3 10*3/uL (ref 0.1–0.9)
MONO%: 7.8 % (ref 0.0–14.0)
NEUT%: 86.1 % — AB (ref 39.0–75.0)
NEUTROS ABS: 2.9 10*3/uL (ref 1.5–6.5)
Platelets: 108 10*3/uL — ABNORMAL LOW (ref 140–400)
RBC: 3.9 10*6/uL — AB (ref 4.20–5.82)
RDW: 13.3 % (ref 11.0–14.6)
WBC: 3.4 10*3/uL — AB (ref 4.0–10.3)
lymph#: 0.2 10*3/uL — ABNORMAL LOW (ref 0.9–3.3)

## 2016-06-24 MED ORDER — SODIUM CHLORIDE 0.9% FLUSH
10.0000 mL | INTRAVENOUS | Status: DC | PRN
  Administered 2016-06-24: 10 mL
  Filled 2016-06-24: qty 10

## 2016-06-24 MED ORDER — HEPARIN SOD (PORK) LOCK FLUSH 100 UNIT/ML IV SOLN
500.0000 [IU] | Freq: Once | INTRAVENOUS | Status: AC | PRN
  Administered 2016-06-24: 500 [IU]
  Filled 2016-06-24: qty 5

## 2016-06-24 NOTE — Patient Instructions (Signed)

## 2016-07-01 ENCOUNTER — Other Ambulatory Visit (HOSPITAL_BASED_OUTPATIENT_CLINIC_OR_DEPARTMENT_OTHER): Payer: Self-pay

## 2016-07-01 ENCOUNTER — Telehealth: Payer: Self-pay | Admitting: Internal Medicine

## 2016-07-01 ENCOUNTER — Ambulatory Visit (HOSPITAL_BASED_OUTPATIENT_CLINIC_OR_DEPARTMENT_OTHER): Payer: Self-pay | Admitting: Internal Medicine

## 2016-07-01 ENCOUNTER — Encounter: Payer: Self-pay | Admitting: Internal Medicine

## 2016-07-01 ENCOUNTER — Ambulatory Visit: Payer: Self-pay

## 2016-07-01 ENCOUNTER — Ambulatory Visit (HOSPITAL_BASED_OUTPATIENT_CLINIC_OR_DEPARTMENT_OTHER): Payer: Self-pay

## 2016-07-01 VITALS — BP 136/66

## 2016-07-01 VITALS — BP 146/73 | HR 67 | Temp 97.8°F | Resp 18 | Ht 71.0 in | Wt 126.7 lb

## 2016-07-01 DIAGNOSIS — C7951 Secondary malignant neoplasm of bone: Secondary | ICD-10-CM

## 2016-07-01 DIAGNOSIS — C3492 Malignant neoplasm of unspecified part of left bronchus or lung: Secondary | ICD-10-CM

## 2016-07-01 DIAGNOSIS — E860 Dehydration: Secondary | ICD-10-CM

## 2016-07-01 DIAGNOSIS — Z5111 Encounter for antineoplastic chemotherapy: Secondary | ICD-10-CM

## 2016-07-01 DIAGNOSIS — R1013 Epigastric pain: Secondary | ICD-10-CM

## 2016-07-01 DIAGNOSIS — Z5112 Encounter for antineoplastic immunotherapy: Secondary | ICD-10-CM

## 2016-07-01 DIAGNOSIS — K5900 Constipation, unspecified: Secondary | ICD-10-CM

## 2016-07-01 LAB — CBC WITH DIFFERENTIAL/PLATELET
BASO%: 0.5 % (ref 0.0–2.0)
Basophils Absolute: 0 10*3/uL (ref 0.0–0.1)
EOS%: 0.2 % (ref 0.0–7.0)
Eosinophils Absolute: 0 10*3/uL (ref 0.0–0.5)
HCT: 35 % — ABNORMAL LOW (ref 38.4–49.9)
HGB: 12 g/dL — ABNORMAL LOW (ref 13.0–17.1)
LYMPH%: 3.7 % — AB (ref 14.0–49.0)
MCH: 30.9 pg (ref 27.2–33.4)
MCHC: 34.2 g/dL (ref 32.0–36.0)
MCV: 90.4 fL (ref 79.3–98.0)
MONO#: 0.8 10*3/uL (ref 0.1–0.9)
MONO%: 10.4 % (ref 0.0–14.0)
NEUT#: 6.7 10*3/uL — ABNORMAL HIGH (ref 1.5–6.5)
NEUT%: 85.2 % — AB (ref 39.0–75.0)
PLATELETS: 416 10*3/uL — AB (ref 140–400)
RBC: 3.87 10*6/uL — AB (ref 4.20–5.82)
RDW: 14.4 % (ref 11.0–14.6)
WBC: 7.9 10*3/uL (ref 4.0–10.3)
lymph#: 0.3 10*3/uL — ABNORMAL LOW (ref 0.9–3.3)

## 2016-07-01 LAB — UA PROTEIN, DIPSTICK - CHCC: Protein, ur: NEGATIVE mg/dL

## 2016-07-01 LAB — COMPREHENSIVE METABOLIC PANEL
ALT: 24 U/L (ref 0–55)
ANION GAP: 10 meq/L (ref 3–11)
AST: 21 U/L (ref 5–34)
Albumin: 3.2 g/dL — ABNORMAL LOW (ref 3.5–5.0)
Alkaline Phosphatase: 90 U/L (ref 40–150)
BUN: 12.1 mg/dL (ref 7.0–26.0)
CHLORIDE: 105 meq/L (ref 98–109)
CO2: 30 meq/L — AB (ref 22–29)
Calcium: 9.3 mg/dL (ref 8.4–10.4)
Creatinine: 0.7 mg/dL (ref 0.7–1.3)
Glucose: 99 mg/dl (ref 70–140)
Potassium: 3.4 mEq/L — ABNORMAL LOW (ref 3.5–5.1)
SODIUM: 145 meq/L (ref 136–145)
Total Bilirubin: 0.24 mg/dL (ref 0.20–1.20)
Total Protein: 6.5 g/dL (ref 6.4–8.3)

## 2016-07-01 LAB — TECHNOLOGIST REVIEW

## 2016-07-01 MED ORDER — HEPARIN SOD (PORK) LOCK FLUSH 100 UNIT/ML IV SOLN
500.0000 [IU] | Freq: Once | INTRAVENOUS | Status: AC | PRN
  Administered 2016-07-01: 500 [IU]
  Filled 2016-07-01: qty 5

## 2016-07-01 MED ORDER — PALONOSETRON HCL INJECTION 0.25 MG/5ML
0.2500 mg | Freq: Once | INTRAVENOUS | Status: AC
  Administered 2016-07-01: 0.25 mg via INTRAVENOUS

## 2016-07-01 MED ORDER — CYANOCOBALAMIN 1000 MCG/ML IJ SOLN
INTRAMUSCULAR | Status: AC
Start: 2016-07-01 — End: 2016-07-01
  Filled 2016-07-01: qty 1

## 2016-07-01 MED ORDER — SODIUM CHLORIDE 0.9% FLUSH
10.0000 mL | INTRAVENOUS | Status: DC | PRN
  Administered 2016-07-01: 10 mL
  Filled 2016-07-01: qty 10

## 2016-07-01 MED ORDER — SODIUM CHLORIDE 0.9 % IV SOLN
Freq: Once | INTRAVENOUS | Status: AC
  Administered 2016-07-01: 12:00:00 via INTRAVENOUS

## 2016-07-01 MED ORDER — SODIUM CHLORIDE 0.9 % IV SOLN
568.5000 mg | Freq: Once | INTRAVENOUS | Status: AC
  Administered 2016-07-01: 570 mg via INTRAVENOUS
  Filled 2016-07-01: qty 57

## 2016-07-01 MED ORDER — MORPHINE SULFATE 15 MG PO TABS: 15.0000 mg | ORAL_TABLET | ORAL | 0 refills | Status: DC | PRN

## 2016-07-01 MED ORDER — SODIUM CHLORIDE 0.9 % IV SOLN
Freq: Once | INTRAVENOUS | Status: AC
  Administered 2016-07-01: 12:00:00 via INTRAVENOUS
  Filled 2016-07-01: qty 5

## 2016-07-01 MED ORDER — SODIUM CHLORIDE 0.9 % IV SOLN
510.0000 mg/m2 | Freq: Once | INTRAVENOUS | Status: AC
  Administered 2016-07-01: 900 mg via INTRAVENOUS
  Filled 2016-07-01: qty 20

## 2016-07-01 MED ORDER — SODIUM CHLORIDE 0.9 % IV SOLN
15.0000 mg/kg | Freq: Once | INTRAVENOUS | Status: AC
  Administered 2016-07-01: 900 mg via INTRAVENOUS
  Filled 2016-07-01: qty 32

## 2016-07-01 MED ORDER — PALONOSETRON HCL INJECTION 0.25 MG/5ML
INTRAVENOUS | Status: AC
  Filled 2016-07-01: qty 5

## 2016-07-01 MED ORDER — CYANOCOBALAMIN 1000 MCG/ML IJ SOLN
1000.0000 ug | Freq: Once | INTRAMUSCULAR | Status: AC
  Administered 2016-07-01: 1000 ug via INTRAMUSCULAR

## 2016-07-01 MED FILL — MORPHINE SULFATE IR 15 MG T: 15 | 5 days supply | Qty: 60 | Fill #0
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
## 2016-07-01 NOTE — Telephone Encounter (Signed)
Scheduled appt per 6/11 los - Patient called to treatment area before apopts schedule - patient to get new schedule in the treatment area - pt aware to ask Rn for schedule. [facility] to contact patient with Ct schedule.

## 2016-07-01 NOTE — Patient Instructions (Signed)
[facility] Discharge Instructions for Patients Receiving Chemotherapy  Today you received the following chemotherapy agents avastin/alimta/carboplatin   To help prevent nausea and vomiting after your treatment, we encourage you to take your nausea medication as directed  If you develop nausea and vomiting that is not controlled by your nausea medication, call the clinic.   BELOW ARE SYMPTOMS THAT SHOULD BE REPORTED IMMEDIATELY:  *FEVER GREATER THAN 100.5 F  *CHILLS WITH OR WITHOUT FEVER  NAUSEA AND VOMITING THAT IS NOT CONTROLLED WITH YOUR NAUSEA MEDICATION  *UNUSUAL SHORTNESS OF BREATH  *UNUSUAL BRUISING OR BLEEDING  TENDERNESS IN MOUTH AND THROAT WITH OR WITHOUT PRESENCE OF ULCERS  *URINARY PROBLEMS  *BOWEL PROBLEMS  UNUSUAL RASH Items with * indicate a potential emergency and should be followed up as soon as possible.  Feel free to call the clinic you have any questions or concerns. The clinic phone number is [phone].

## 2016-07-03 ENCOUNTER — Ambulatory Visit: Payer: Self-pay

## 2016-07-03 ENCOUNTER — Encounter: Payer: Self-pay | Admitting: Nutrition

## 2016-07-08 ENCOUNTER — Other Ambulatory Visit (HOSPITAL_BASED_OUTPATIENT_CLINIC_OR_DEPARTMENT_OTHER): Payer: Self-pay

## 2016-07-08 ENCOUNTER — Ambulatory Visit (HOSPITAL_BASED_OUTPATIENT_CLINIC_OR_DEPARTMENT_OTHER): Payer: Self-pay

## 2016-07-08 ENCOUNTER — Encounter: Payer: Self-pay | Admitting: Nutrition

## 2016-07-08 DIAGNOSIS — C3492 Malignant neoplasm of unspecified part of left bronchus or lung: Secondary | ICD-10-CM

## 2016-07-08 DIAGNOSIS — E860 Dehydration: Secondary | ICD-10-CM

## 2016-07-08 LAB — CBC WITH DIFFERENTIAL/PLATELET
BASO%: 0.4 % (ref 0.0–2.0)
BASOS ABS: 0 10*3/uL (ref 0.0–0.1)
EOS%: 0.1 % (ref 0.0–7.0)
Eosinophils Absolute: 0 10*3/uL (ref 0.0–0.5)
HEMATOCRIT: 35.2 % — AB (ref 38.4–49.9)
HEMOGLOBIN: 12.2 g/dL — AB (ref 13.0–17.1)
LYMPH#: 0.4 10*3/uL — AB (ref 0.9–3.3)
LYMPH%: 8.8 % — ABNORMAL LOW (ref 14.0–49.0)
MCH: 31.4 pg (ref 27.2–33.4)
MCHC: 34.6 g/dL (ref 32.0–36.0)
MCV: 90.9 fL (ref 79.3–98.0)
MONO#: 0.5 10*3/uL (ref 0.1–0.9)
MONO%: 11.4 % (ref 0.0–14.0)
NEUT#: 3.2 10*3/uL (ref 1.5–6.5)
NEUT%: 79.3 % — ABNORMAL HIGH (ref 39.0–75.0)
Platelets: 287 10*3/uL (ref 140–400)
RBC: 3.87 10*6/uL — ABNORMAL LOW (ref 4.20–5.82)
RDW: 14.8 % — AB (ref 11.0–14.6)
WBC: 4 10*3/uL (ref 4.0–10.3)

## 2016-07-08 LAB — COMPREHENSIVE METABOLIC PANEL
ALBUMIN: 3.4 g/dL — AB (ref 3.5–5.0)
ALT: 32 U/L (ref 0–55)
AST: 17 U/L (ref 5–34)
Alkaline Phosphatase: 96 U/L (ref 40–150)
Anion Gap: 8 mEq/L (ref 3–11)
BUN: 13.9 mg/dL (ref 7.0–26.0)
CALCIUM: 9.7 mg/dL (ref 8.4–10.4)
CHLORIDE: 103 meq/L (ref 98–109)
CO2: 30 mEq/L — ABNORMAL HIGH (ref 22–29)
CREATININE: 0.7 mg/dL (ref 0.7–1.3)
EGFR: >90 mL/min/{1.73_m2} (ref >90)
GLUCOSE: 78 mg/dL (ref 70–140)
Potassium: 3.5 mEq/L (ref 3.5–5.1)
SODIUM: 140 meq/L (ref 136–145)
Total Bilirubin: 0.24 mg/dL (ref 0.20–1.20)
Total Protein: 7.2 g/dL (ref 6.4–8.3)

## 2016-07-08 MED ORDER — HEPARIN SOD (PORK) LOCK FLUSH 100 UNIT/ML IV SOLN
500.0000 [IU] | Freq: Once | INTRAVENOUS | Status: AC | PRN
  Administered 2016-07-08: 500 [IU]
  Filled 2016-07-08: qty 5

## 2016-07-08 MED ORDER — SODIUM CHLORIDE 0.9% FLUSH
10.0000 mL | INTRAVENOUS | Status: DC | PRN
  Administered 2016-07-08: 10 mL
  Filled 2016-07-08: qty 10

## 2016-07-08 NOTE — Patient Instructions (Signed)

## 2016-07-09 ENCOUNTER — Ambulatory Visit: Admission: RE | Admit: 2016-07-09 | Payer: Self-pay | Source: Ambulatory Visit | Admitting: Urology

## 2016-07-15 ENCOUNTER — Other Ambulatory Visit (HOSPITAL_BASED_OUTPATIENT_CLINIC_OR_DEPARTMENT_OTHER): Payer: Self-pay

## 2016-07-15 ENCOUNTER — Ambulatory Visit (HOSPITAL_BASED_OUTPATIENT_CLINIC_OR_DEPARTMENT_OTHER): Payer: Self-pay

## 2016-07-15 DIAGNOSIS — E860 Dehydration: Secondary | ICD-10-CM

## 2016-07-15 DIAGNOSIS — C3492 Malignant neoplasm of unspecified part of left bronchus or lung: Secondary | ICD-10-CM

## 2016-07-15 LAB — CBC WITH DIFFERENTIAL/PLATELET
BASO%: 0 % (ref 0.0–2.0)
Basophils Absolute: 0 10*3/uL (ref 0.0–0.1)
EOS ABS: 0 10*3/uL (ref 0.0–0.5)
EOS%: 0.2 % (ref 0.0–7.0)
HCT: 32.5 % — ABNORMAL LOW (ref 38.4–49.9)
HEMOGLOBIN: 10.9 g/dL — AB (ref 13.0–17.1)
LYMPH%: 11 % — ABNORMAL LOW (ref 14.0–49.0)
MCH: 30.4 pg (ref 27.2–33.4)
MCHC: 33.5 g/dL (ref 32.0–36.0)
MCV: 90.5 fL (ref 79.3–98.0)
MONO#: 0.3 10*3/uL (ref 0.1–0.9)
MONO%: 6.7 % (ref 0.0–14.0)
NEUT%: 82.1 % — ABNORMAL HIGH (ref 39.0–75.0)
NEUTROS ABS: 4.2 10*3/uL (ref 1.5–6.5)
Platelets: 91 10*3/uL — ABNORMAL LOW (ref 140–400)
RBC: 3.59 10*6/uL — ABNORMAL LOW (ref 4.20–5.82)
RDW: 15.4 % — AB (ref 11.0–14.6)
WBC: 5.1 10*3/uL (ref 4.0–10.3)
lymph#: 0.6 10*3/uL — ABNORMAL LOW (ref 0.9–3.3)

## 2016-07-15 LAB — COMPREHENSIVE METABOLIC PANEL
ALBUMIN: 3.2 g/dL — AB (ref 3.5–5.0)
ALK PHOS: 96 U/L (ref 40–150)
ALT: 23 U/L (ref 0–55)
AST: 16 U/L (ref 5–34)
Anion Gap: 8 mEq/L (ref 3–11)
BILIRUBIN TOTAL: 0.22 mg/dL (ref 0.20–1.20)
BUN: 10.3 mg/dL (ref 7.0–26.0)
CO2: 27 meq/L (ref 22–29)
CREATININE: 0.7 mg/dL (ref 0.7–1.3)
Calcium: 9.4 mg/dL (ref 8.4–10.4)
Chloride: 107 mEq/L (ref 98–109)
EGFR: >90 mL/min/{1.73_m2} (ref >90)
GLUCOSE: 96 mg/dL (ref 70–140)
Potassium: 3.7 mEq/L (ref 3.5–5.1)
SODIUM: 142 meq/L (ref 136–145)
TOTAL PROTEIN: 6.8 g/dL (ref 6.4–8.3)

## 2016-07-15 MED ORDER — SODIUM CHLORIDE 0.9% FLUSH
10.0000 mL | INTRAVENOUS | Status: DC | PRN
  Administered 2016-07-15: 10 mL
  Filled 2016-07-15: qty 10

## 2016-07-15 MED ORDER — HEPARIN SOD (PORK) LOCK FLUSH 100 UNIT/ML IV SOLN
500.0000 [IU] | Freq: Once | INTRAVENOUS | Status: AC | PRN
  Administered 2016-07-15: 500 [IU]
  Filled 2016-07-15: qty 5

## 2016-07-15 NOTE — Patient Instructions (Signed)

## 2016-07-19 ENCOUNTER — Ambulatory Visit (HOSPITAL_COMMUNITY): Payer: Self-pay

## 2016-07-22 ENCOUNTER — Ambulatory Visit (HOSPITAL_BASED_OUTPATIENT_CLINIC_OR_DEPARTMENT_OTHER): Payer: Self-pay

## 2016-07-22 ENCOUNTER — Other Ambulatory Visit (HOSPITAL_BASED_OUTPATIENT_CLINIC_OR_DEPARTMENT_OTHER): Payer: Self-pay

## 2016-07-22 ENCOUNTER — Ambulatory Visit: Payer: Self-pay

## 2016-07-22 ENCOUNTER — Telehealth: Payer: Self-pay | Admitting: Internal Medicine

## 2016-07-22 ENCOUNTER — Ambulatory Visit (HOSPITAL_BASED_OUTPATIENT_CLINIC_OR_DEPARTMENT_OTHER): Payer: Self-pay | Admitting: Internal Medicine

## 2016-07-22 ENCOUNTER — Other Ambulatory Visit: Payer: Self-pay | Admitting: Internal Medicine

## 2016-07-22 ENCOUNTER — Encounter: Payer: Self-pay | Admitting: Internal Medicine

## 2016-07-22 VITALS — BP 133/76

## 2016-07-22 VITALS — BP 155/102 | HR 79 | Temp 97.8°F | Resp 18 | Ht 71.0 in | Wt 128.4 lb

## 2016-07-22 DIAGNOSIS — Z5111 Encounter for antineoplastic chemotherapy: Secondary | ICD-10-CM

## 2016-07-22 DIAGNOSIS — Z5112 Encounter for antineoplastic immunotherapy: Secondary | ICD-10-CM

## 2016-07-22 DIAGNOSIS — C3492 Malignant neoplasm of unspecified part of left bronchus or lung: Secondary | ICD-10-CM

## 2016-07-22 DIAGNOSIS — E860 Dehydration: Secondary | ICD-10-CM

## 2016-07-22 LAB — CBC WITH DIFFERENTIAL/PLATELET
BASO%: 0.4 % (ref 0.0–2.0)
BASOS ABS: 0 10*3/uL (ref 0.0–0.1)
EOS ABS: 0 10*3/uL (ref 0.0–0.5)
EOS%: 1.2 % (ref 0.0–7.0)
HEMATOCRIT: 34.1 % — AB (ref 38.4–49.9)
HGB: 11.6 g/dL — ABNORMAL LOW (ref 13.0–17.1)
LYMPH%: 9.2 % — ABNORMAL LOW (ref 14.0–49.0)
MCH: 30.9 pg (ref 27.2–33.4)
MCHC: 34 g/dL (ref 32.0–36.0)
MCV: 90.7 fL (ref 79.3–98.0)
MONO#: 0.9 10*3/uL (ref 0.1–0.9)
MONO%: 25.2 % — ABNORMAL HIGH (ref 0.0–14.0)
NEUT#: 2.4 10*3/uL (ref 1.5–6.5)
NEUT%: 64 % (ref 39.0–75.0)
PLATELETS: 415 10*3/uL — AB (ref 140–400)
RBC: 3.76 10*6/uL — AB (ref 4.20–5.82)
RDW: 16.9 % — ABNORMAL HIGH (ref 11.0–14.6)
WBC: 3.7 10*3/uL — ABNORMAL LOW (ref 4.0–10.3)
lymph#: 0.3 10*3/uL — ABNORMAL LOW (ref 0.9–3.3)

## 2016-07-22 LAB — COMPREHENSIVE METABOLIC PANEL
ALT: 18 U/L (ref 0–55)
ANION GAP: 9 meq/L (ref 3–11)
AST: 17 U/L (ref 5–34)
Albumin: 3.4 g/dL — ABNORMAL LOW (ref 3.5–5.0)
Alkaline Phosphatase: 83 U/L (ref 40–150)
BUN: 5 mg/dL — ABNORMAL LOW (ref 7.0–26.0)
CHLORIDE: 104 meq/L (ref 98–109)
CO2: 30 meq/L — AB (ref 22–29)
CREATININE: 0.8 mg/dL (ref 0.7–1.3)
Calcium: 9.9 mg/dL (ref 8.4–10.4)
Glucose: 112 mg/dl (ref 70–140)
POTASSIUM: 3.5 meq/L (ref 3.5–5.1)
Sodium: 143 mEq/L (ref 136–145)
Total Bilirubin: 0.3 mg/dL (ref 0.20–1.20)
Total Protein: 7.3 g/dL (ref 6.4–8.3)

## 2016-07-22 MED ORDER — SODIUM CHLORIDE 0.9 % IV SOLN
15.0000 mg/kg | Freq: Once | INTRAVENOUS | Status: AC
  Administered 2016-07-22: 900 mg via INTRAVENOUS
  Filled 2016-07-22: qty 32

## 2016-07-22 MED ORDER — SODIUM CHLORIDE 0.9% FLUSH
10.0000 mL | INTRAVENOUS | Status: DC | PRN
  Administered 2016-07-22: 10 mL
  Filled 2016-07-22: qty 10

## 2016-07-22 MED ORDER — SODIUM CHLORIDE 0.9 % IV SOLN
Freq: Once | INTRAVENOUS | Status: AC
  Administered 2016-07-22: 11:00:00 via INTRAVENOUS

## 2016-07-22 MED ORDER — DEXAMETHASONE 4 MG PO TABS: ORAL_TABLET | ORAL | 1 refills | Status: DC

## 2016-07-22 MED ORDER — PALONOSETRON HCL INJECTION 0.25 MG/5ML
0.2500 mg | Freq: Once | INTRAVENOUS | Status: AC
  Administered 2016-07-22: 0.25 mg via INTRAVENOUS

## 2016-07-22 MED ORDER — SODIUM CHLORIDE 0.9 % IV SOLN
520.0000 mg/m2 | Freq: Once | INTRAVENOUS | Status: AC
  Administered 2016-07-22: 900 mg via INTRAVENOUS
  Filled 2016-07-22: qty 20

## 2016-07-22 MED ORDER — SODIUM CHLORIDE 0.9 % IV SOLN
568.5000 mg | Freq: Once | INTRAVENOUS | Status: AC
  Administered 2016-07-22: 570 mg via INTRAVENOUS
  Filled 2016-07-22: qty 57

## 2016-07-22 MED ORDER — SODIUM CHLORIDE 0.9 % IV SOLN
Freq: Once | INTRAVENOUS | Status: AC
  Administered 2016-07-22: 11:00:00 via INTRAVENOUS
  Filled 2016-07-22: qty 5

## 2016-07-22 MED ORDER — IPRATROPIUM-ALBUTEROL 20-100 MCG/ACT IN AERS: 1.0000 | INHALATION_SPRAY | Freq: Four times a day (QID) | RESPIRATORY_TRACT | 0 refills | Status: DC

## 2016-07-22 MED ORDER — HEPARIN SOD (PORK) LOCK FLUSH 100 UNIT/ML IV SOLN
500.0000 [IU] | Freq: Once | INTRAVENOUS | Status: AC | PRN
  Administered 2016-07-22: 500 [IU]
  Filled 2016-07-22: qty 5

## 2016-07-22 MED ORDER — PALONOSETRON HCL INJECTION 0.25 MG/5ML
INTRAVENOUS | Status: AC
  Filled 2016-07-22: qty 5

## 2016-07-22 MED FILL — DEXAMETHASONE 4 MG TABLET: 4 | 21 days supply | Qty: 40 | Fill #0

## 2016-07-22 MED FILL — COMBIVENT RESPIMAT INHAL SP: 20-100 | 30 days supply | Qty: 4 | Fill #0

## 2016-07-22 NOTE — Telephone Encounter (Signed)
2 bottles of Oral Contrast and a copy of the instructions with the telephone number for [facility] Scheduling was given to patient per 07/22/16 los. Patient stated that he will call and reschedule his CT appointment as per his convenience. Patient was also given a copy of the AVS report and appointment schedule, per 07/22/16 los.

## 2016-07-22 NOTE — Patient Instructions (Signed)
Implanted Port Home Guide An implanted port is a type of central line that is placed under the skin. Central lines are used to provide IV access when treatment or nutrition needs to be given through a person's veins. Implanted ports are used for long-term IV access. An implanted port may be placed because:  You need IV medicine that would be irritating to the small veins in your hands or arms.  You need long-term IV medicines, such as antibiotics.  You need IV nutrition for a long period.  You need frequent blood draws for lab tests.  You need dialysis.  Implanted ports are usually placed in the chest area, but they can also be placed in the upper arm, the abdomen, or the leg. An implanted port has two main parts:  Reservoir. The reservoir is round and will appear as a small, raised area under your skin. The reservoir is the part where a needle is inserted to give medicines or draw blood.  Catheter. The catheter is a thin, flexible tube that extends from the reservoir. The catheter is placed into a large vein. Medicine that is inserted into the reservoir goes into the catheter and then into the vein.  How will I care for my incision site? Do not get the incision site wet. Bathe or shower as directed by your health care provider. How is my port accessed? Special steps must be taken to access the port:  Before the port is accessed, a numbing cream can be placed on the skin. This helps numb the skin over the port site.  Your health care provider uses a sterile technique to access the port. ? Your health care provider must put on a mask and sterile gloves. ? The skin over your port is cleaned carefully with an antiseptic and allowed to dry. ? The port is gently pinched between sterile gloves, and a needle is inserted into the port.  Only "non-coring" port needles should be used to access the port. Once the port is accessed, a blood return should be checked. This helps ensure that the port  is in the vein and is not clogged.  If your port needs to remain accessed for a constant infusion, a clear (transparent) bandage will be placed over the needle site. The bandage and needle will need to be changed every week, or as directed by your health care provider.  Keep the bandage covering the needle clean and dry. Do not get it wet. Follow your health care provider's instructions on how to take a shower or bath while the port is accessed.  If your port does not need to stay accessed, no bandage is needed over the port.  What is flushing? Flushing helps keep the port from getting clogged. Follow your health care provider's instructions on how and when to flush the port. Ports are usually flushed with saline solution or a medicine called heparin. The need for flushing will depend on how the port is used.  If the port is used for intermittent medicines or blood draws, the port will need to be flushed: ? After medicines have been given. ? After blood has been drawn. ? As part of routine maintenance.  If a constant infusion is running, the port may not need to be flushed.  How long will my port stay implanted? The port can stay in for as long as your health care provider thinks it is needed. When it is time for the port to come out, surgery will be   done to remove it. The procedure is similar to the one performed when the port was put in. When should I seek immediate medical care? When you have an implanted port, you should seek immediate medical care if:  You notice a bad smell coming from the incision site.  You have swelling, redness, or drainage at the incision site.  You have more swelling or pain at the port site or the surrounding area.  You have a fever that is not controlled with medicine.  This information is not intended to replace advice given to you by your health care provider. Make sure you discuss any questions you have with your health care provider. Document  Released: 01/07/2005 Document Revised: 06/15/2015 Document Reviewed: 09/14/2012 Elsevier Interactive Patient Education  2017 [employer] Inc.  

## 2016-07-22 NOTE — Patient Instructions (Signed)
[facility] Discharge Instructions for Patients Receiving Chemotherapy  Today you received the following chemotherapy agents:  Alimta, Avastin, Carboplatin  To help prevent nausea and vomiting after your treatment, we encourage you to take your nausea medication as prescribed.   If you develop nausea and vomiting that is not controlled by your nausea medication, call the clinic.   BELOW ARE SYMPTOMS THAT SHOULD BE REPORTED IMMEDIATELY:  *FEVER GREATER THAN 100.5 F  *CHILLS WITH OR WITHOUT FEVER  NAUSEA AND VOMITING THAT IS NOT CONTROLLED WITH YOUR NAUSEA MEDICATION  *UNUSUAL SHORTNESS OF BREATH  *UNUSUAL BRUISING OR BLEEDING  TENDERNESS IN MOUTH AND THROAT WITH OR WITHOUT PRESENCE OF ULCERS  *URINARY PROBLEMS  *BOWEL PROBLEMS  UNUSUAL RASH Items with * indicate a potential emergency and should be followed up as soon as possible.  Feel free to call the clinic you have any questions or concerns. The clinic phone number is [phone].  Please show the [facility] at check-in to the Emergency Department and triage nurse.

## 2016-07-22 NOTE — Progress Notes (Signed)
[facility] Telephone:(336) [phone]   [phone]  OFFICE PROGRESS NOTE  Patient, No Pcp Per No address on file  DIAGNOSIS: Stage IV (T3, N3, M1c) non-small cell lung cancer, moderately to poorly differentiated adenocarcinoma diagnosed in February 2018 with negative EGFR, ALK, ROS1 and BRAF mutations, with PD L1 expression 15-20 %.  PRIOR THERAPY: Palliative radiotherapy to the T12 and L3 completed on 05/27/2016 under the care of Dr. Tammi Klippel..  CURRENT THERAPY: systemic chemotherapy with carboplatin for AUC of 5, Alimta 500 MG/M2 and Avastin 15 MG/KG every 3 weeks. Status post 3 cycles.  INTERVAL HISTORY: [name] 56 y.o. male returns to the clinic today for follow-up visit. The patient is feeling fine today was no specific complaints except for increased tearing of the eye. He also continues to have intermittent abdominal pain and low back pain. He denied having any chest pain, shortness of breath, cough or hemoptysis. He denied having any fever or chills. He denied having any significant weight loss or night sweats. He tolerated the last cycle of her systemic chemotherapy fairly well. He was supposed to have repeat CT scan of the chest, abdomen and pelvis before his visit today but unfortunately it was not scheduled as ordered.    MEDICAL HISTORY: Past Medical History:  Diagnosis Date  . Abdominal pain 06/04/2016  . Adenocarcinoma of left lung, stage 4 ([place]) 05/02/2016  . Bronchitis due to tobacco use ([place])   . Cancer ([place] [place])    Lung  . Dehydration 06/04/2016  . Encounter for antineoplastic chemotherapy 05/02/2016  . Goals of care, counseling/discussion 05/02/2016  . Recurrent upper respiratory infection (URI)   . Seizures ([facility]) 05/2011   new onset  . Shortness of breath     ALLERGIES:  has No Known Allergies.  MEDICATIONS:  Current Outpatient Prescriptions  Medication Sig Dispense Refill  . beclomethasone (QVAR) 40 MCG/ACT inhaler Inhale 2 puffs  into the lungs as needed (he ends up taking about 6 times a day).    Marland Kitchen dexamethasone (DECADRON) 4 MG tablet 4 mg by mouth twice a day the day before, day of and day after the chemotherapy every 3 weeks 40 tablet 1  . folic acid (FOLVITE) 1 MG tablet Take 1 tablet (1 mg total) by mouth daily. 30 tablet 4  . Ipratropium-Albuterol (COMBIVENT) 20-100 MCG/ACT AERS respimat Inhale 1 puff into the lungs every 6 (six) hours. (Patient not taking: Reported on 05/02/2016) 1 Inhaler 0  . lidocaine-prilocaine (EMLA) cream Apply 1 application topically as needed. 30 g 0  . morphine (MSIR) 15 MG tablet Take 1-2 tablets (15-30 mg total) by mouth every 4 (four) hours as needed for severe pain. 60 tablet 0  . prochlorperazine (COMPAZINE) 10 MG tablet Take 1 tablet (10 mg total) by mouth every 6 (six) hours as needed for nausea or vomiting. 30 tablet 0   No current facility-administered medications for this visit.    Facility-Administered Medications Ordered in Other Visits  Medication Dose Route Frequency Provider Last Rate Last Dose  . sodium chloride flush (NS) 0.9 % injection 10 mL  10 mL Intracatheter PRN Curt Bears, MD   10 mL at 07/22/16 6010    SURGICAL HISTORY:  Past Surgical History:  Procedure Laterality Date  . IR FLUORO GUIDE PORT INSERTION RIGHT  05/13/2016  . IR US GUIDE VASC ACCESS RIGHT  05/13/2016  . NO PAST SURGERIES      REVIEW OF SYSTEMS:  A comprehensive review of systems  was negative except for: Eyes: positive for Increased tears Gastrointestinal: positive for abdominal pain Musculoskeletal: positive for back pain   PHYSICAL EXAMINATION: General appearance: alert, cooperative, fatigued and no distress Head: Normocephalic, without obvious abnormality, atraumatic Neck: no adenopathy, no JVD, supple, symmetrical, trachea midline and thyroid not enlarged, symmetric, no tenderness/mass/nodules Lymph nodes: Cervical, supraclavicular, and axillary nodes normal. Resp: clear to  auscultation bilaterally Back: symmetric, no curvature. ROM normal. No CVA tenderness. Cardio: regular rate and rhythm, S1, S2 normal, no murmur, click, rub or gallop GI: soft, non-tender; bowel sounds normal; no masses,  no organomegaly Extremities: extremities normal, atraumatic, no cyanosis or edema Neurologic: Alert and oriented X 3, normal strength and tone. Normal symmetric reflexes. Normal coordination and gait  ECOG PERFORMANCE STATUS: 1 - Symptomatic but completely ambulatory  Blood pressure (!) 155/102, pulse 79, temperature 97.8 F (36.6 C), temperature source Oral, resp. rate 18, height 5' 11"  (1.803 m), weight 128 lb 6.4 oz (58.2 kg), SpO2 97 %.  LABORATORY DATA: Lab Results  Component Value Date   WBC 3.7 (L) 07/22/2016   HGB 11.6 (L) 07/22/2016   HCT 34.1 (L) 07/22/2016   MCV 90.7 07/22/2016   PLT 415 (H) 07/22/2016      Chemistry      Component Value Date/Time   NA 142 07/15/2016 0940   K 3.7 07/15/2016 0940   CL 103 05/29/2011 0545   CO2 27 07/15/2016 0940   BUN 10.3 07/15/2016 0940   CREATININE 0.7 07/15/2016 0940      Component Value Date/Time   CALCIUM 9.4 07/15/2016 0940   ALKPHOS 96 07/15/2016 0940   AST 16 07/15/2016 0940   ALT 23 07/15/2016 0940   BILITOT 0.22 07/15/2016 0940       RADIOGRAPHIC STUDIES: No results found.  ASSESSMENT AND PLAN:  This is a very pleasant 56 years old African-American male with a stage IV non-small cell lung cancer, adenocarcinoma with no actionable mutations who is currently undergoing systemic chemotherapy was carboplatin, Alimta and Avastin status post 3 cycles and has been tolerating the treatment fairly well. The patient was supposed to have restaging CT scan of the chest, abdomen and pelvis before this visit but unfortunately it was not scheduled as ordered 3 weeks ago. I will arrange for the patient to have his scan performed in the next few days. He will proceed with cycle #4 today as a scheduled. I will  see him back for follow-up visit in 3 weeks for reevaluation before the next cycle of his treatment. The patient was advised to call immediately if he has any concerning symptoms in the interval. The patient voices understanding of current disease status and treatment options and is in agreement with the current care plan. All questions were answered. The patient knows to call the clinic with any problems, questions or concerns. We can certainly see the patient much sooner if necessary. I spent 10 minutes counseling the patient face to face. The total time spent in the appointment was 15 minutes.  Disclaimer: This note was dictated with voice recognition software. Similar sounding words can inadvertently be transcribed and may not be corrected upon review.

## 2016-07-23 ENCOUNTER — Ambulatory Visit: Payer: Self-pay

## 2016-07-29 ENCOUNTER — Ambulatory Visit (HOSPITAL_BASED_OUTPATIENT_CLINIC_OR_DEPARTMENT_OTHER): Payer: Medicaid Other

## 2016-07-29 ENCOUNTER — Other Ambulatory Visit (HOSPITAL_BASED_OUTPATIENT_CLINIC_OR_DEPARTMENT_OTHER): Payer: Medicaid Other

## 2016-07-29 DIAGNOSIS — C3492 Malignant neoplasm of unspecified part of left bronchus or lung: Secondary | ICD-10-CM | POA: Diagnosis present

## 2016-07-29 DIAGNOSIS — E860 Dehydration: Secondary | ICD-10-CM

## 2016-07-29 LAB — COMPREHENSIVE METABOLIC PANEL
ALBUMIN: 3.6 g/dL (ref 3.5–5.0)
ALK PHOS: 84 U/L (ref 40–150)
ALT: 16 U/L (ref 0–55)
AST: 17 U/L (ref 5–34)
Anion Gap: 9 mEq/L (ref 3–11)
BILIRUBIN TOTAL: 0.68 mg/dL (ref 0.20–1.20)
BUN: 11.4 mg/dL (ref 7.0–26.0)
CO2: 32 mEq/L — ABNORMAL HIGH (ref 22–29)
CREATININE: 0.7 mg/dL (ref 0.7–1.3)
Calcium: 10 mg/dL (ref 8.4–10.4)
Chloride: 102 mEq/L (ref 98–109)
EGFR: >90 mL/min/{1.73_m2} (ref >90)
GLUCOSE: 102 mg/dL (ref 70–140)
Potassium: 3.4 mEq/L — ABNORMAL LOW (ref 3.5–5.1)
SODIUM: 142 meq/L (ref 136–145)
TOTAL PROTEIN: 7.5 g/dL (ref 6.4–8.3)

## 2016-07-29 LAB — CBC WITH DIFFERENTIAL/PLATELET
BASO%: 0.7 % (ref 0.0–2.0)
Basophils Absolute: 0 10*3/uL (ref 0.0–0.1)
EOS%: 0.7 % (ref 0.0–7.0)
Eosinophils Absolute: 0 10*3/uL (ref 0.0–0.5)
HCT: 32 % — ABNORMAL LOW (ref 38.4–49.9)
HEMOGLOBIN: 10.6 g/dL — AB (ref 13.0–17.1)
LYMPH%: 15.1 % (ref 14.0–49.0)
MCH: 30.3 pg (ref 27.2–33.4)
MCHC: 33.1 g/dL (ref 32.0–36.0)
MCV: 91.4 fL (ref 79.3–98.0)
MONO#: 0.2 10*3/uL (ref 0.1–0.9)
MONO%: 7.2 % (ref 0.0–14.0)
NEUT%: 76.3 % — ABNORMAL HIGH (ref 39.0–75.0)
NEUTROS ABS: 2.1 10*3/uL (ref 1.5–6.5)
Platelets: 151 10*3/uL (ref 140–400)
RBC: 3.5 10*6/uL — ABNORMAL LOW (ref 4.20–5.82)
RDW: 16.4 % — AB (ref 11.0–14.6)
WBC: 2.8 10*3/uL — ABNORMAL LOW (ref 4.0–10.3)
lymph#: 0.4 10*3/uL — ABNORMAL LOW (ref 0.9–3.3)

## 2016-07-29 MED ORDER — HEPARIN SOD (PORK) LOCK FLUSH 100 UNIT/ML IV SOLN
500.0000 [IU] | Freq: Once | INTRAVENOUS | Status: AC | PRN
  Administered 2016-07-29: 500 [IU]
  Filled 2016-07-29: qty 5

## 2016-07-29 MED ORDER — SODIUM CHLORIDE 0.9% FLUSH
10.0000 mL | INTRAVENOUS | Status: DC | PRN
  Administered 2016-07-29: 10 mL
  Filled 2016-07-29: qty 10

## 2016-07-30 ENCOUNTER — Ambulatory Visit (HOSPITAL_COMMUNITY)
Admission: RE | Admit: 2016-07-30 | Discharge: 2016-07-30 | Disposition: A | Discharge status: Home / Self Care | Payer: Medicaid Other | Source: Ambulatory Visit | Attending: Internal Medicine | Admitting: Internal Medicine

## 2016-07-30 ENCOUNTER — Encounter (HOSPITAL_COMMUNITY): Payer: Self-pay

## 2016-07-30 DIAGNOSIS — Z5111 Encounter for antineoplastic chemotherapy: Secondary | ICD-10-CM | POA: Diagnosis not present

## 2016-07-30 DIAGNOSIS — C3492 Malignant neoplasm of unspecified part of left bronchus or lung: Secondary | ICD-10-CM | POA: Insufficient documentation

## 2016-07-30 DIAGNOSIS — I700 Atherosclerosis of aorta: Secondary | ICD-10-CM | POA: Insufficient documentation

## 2016-07-30 DIAGNOSIS — R1013 Epigastric pain: Secondary | ICD-10-CM | POA: Diagnosis present

## 2016-07-30 DIAGNOSIS — C7951 Secondary malignant neoplasm of bone: Secondary | ICD-10-CM | POA: Diagnosis present

## 2016-07-30 MED ORDER — IOPAMIDOL (ISOVUE-300) INJECTION 61%
INTRAVENOUS | Status: AC
  Filled 2016-07-30: qty 100

## 2016-07-30 MED ORDER — IOPAMIDOL (ISOVUE-300) INJECTION 61%
100.0000 mL | Freq: Once | INTRAVENOUS | Status: AC | PRN
  Administered 2016-07-30: 100 mL via INTRAVENOUS

## 2016-08-05 ENCOUNTER — Ambulatory Visit (HOSPITAL_BASED_OUTPATIENT_CLINIC_OR_DEPARTMENT_OTHER): Payer: Medicaid Other

## 2016-08-05 ENCOUNTER — Other Ambulatory Visit (HOSPITAL_BASED_OUTPATIENT_CLINIC_OR_DEPARTMENT_OTHER): Payer: Medicaid Other

## 2016-08-05 DIAGNOSIS — C3492 Malignant neoplasm of unspecified part of left bronchus or lung: Secondary | ICD-10-CM | POA: Diagnosis present

## 2016-08-05 DIAGNOSIS — E860 Dehydration: Secondary | ICD-10-CM

## 2016-08-05 LAB — COMPREHENSIVE METABOLIC PANEL
ALBUMIN: 3.7 g/dL (ref 3.5–5.0)
ALK PHOS: 86 U/L (ref 40–150)
ALT: 16 U/L (ref 0–55)
AST: 14 U/L (ref 5–34)
Anion Gap: 8 mEq/L (ref 3–11)
BUN: 8.8 mg/dL (ref 7.0–26.0)
CHLORIDE: 103 meq/L (ref 98–109)
CO2: 29 mEq/L (ref 22–29)
CREATININE: 0.8 mg/dL (ref 0.7–1.3)
Calcium: 9.8 mg/dL (ref 8.4–10.4)
EGFR: >90 mL/min/{1.73_m2} (ref >90)
GLUCOSE: 105 mg/dL (ref 70–140)
POTASSIUM: 3.7 meq/L (ref 3.5–5.1)
SODIUM: 140 meq/L (ref 136–145)
Total Bilirubin: 0.26 mg/dL (ref 0.20–1.20)
Total Protein: 7.5 g/dL (ref 6.4–8.3)

## 2016-08-05 LAB — CBC WITH DIFFERENTIAL/PLATELET
BASO%: 0 % (ref 0.0–2.0)
BASOS ABS: 0 10*3/uL (ref 0.0–0.1)
EOS%: 0 % (ref 0.0–7.0)
Eosinophils Absolute: 0 10*3/uL (ref 0.0–0.5)
HEMATOCRIT: 32.7 % — AB (ref 38.4–49.9)
HGB: 11 g/dL — ABNORMAL LOW (ref 13.0–17.1)
LYMPH#: 0.3 10*3/uL — AB (ref 0.9–3.3)
LYMPH%: 5 % — ABNORMAL LOW (ref 14.0–49.0)
MCH: 30.8 pg (ref 27.2–33.4)
MCHC: 33.6 g/dL (ref 32.0–36.0)
MCV: 91.7 fL (ref 79.3–98.0)
MONO#: 0.6 10*3/uL (ref 0.1–0.9)
MONO%: 10.2 % (ref 0.0–14.0)
NEUT#: 5.2 10*3/uL (ref 1.5–6.5)
NEUT%: 84.8 % — ABNORMAL HIGH (ref 39.0–75.0)
Platelets: 76 10*3/uL — ABNORMAL LOW (ref 140–400)
RBC: 3.57 10*6/uL — ABNORMAL LOW (ref 4.20–5.82)
RDW: 17.4 % — AB (ref 11.0–14.6)
WBC: 6.1 10*3/uL (ref 4.0–10.3)

## 2016-08-05 MED ORDER — HEPARIN SOD (PORK) LOCK FLUSH 100 UNIT/ML IV SOLN
500.0000 [IU] | Freq: Once | INTRAVENOUS | Status: AC | PRN
  Administered 2016-08-05: 500 [IU]
  Filled 2016-08-05: qty 5

## 2016-08-05 MED ORDER — SODIUM CHLORIDE 0.9% FLUSH
10.0000 mL | INTRAVENOUS | Status: DC | PRN
  Administered 2016-08-05: 10 mL
  Filled 2016-08-05: qty 10

## 2016-08-05 NOTE — Patient Instructions (Signed)

## 2016-08-07 ENCOUNTER — Telehealth: Payer: Self-pay | Admitting: Radiation Oncology

## 2016-08-07 ENCOUNTER — Other Ambulatory Visit: Payer: Self-pay | Admitting: Medical Oncology

## 2016-08-07 DIAGNOSIS — R1013 Epigastric pain: Secondary | ICD-10-CM

## 2016-08-07 DIAGNOSIS — C7951 Secondary malignant neoplasm of bone: Secondary | ICD-10-CM

## 2016-08-07 DIAGNOSIS — Z5111 Encounter for antineoplastic chemotherapy: Secondary | ICD-10-CM

## 2016-08-07 DIAGNOSIS — C3492 Malignant neoplasm of unspecified part of left bronchus or lung: Secondary | ICD-10-CM

## 2016-08-07 MED ORDER — MORPHINE SULFATE 15 MG PO TABS: 15.0000 mg | ORAL_TABLET | ORAL | 0 refills | Status: DC | PRN

## 2016-08-07 MED FILL — MORPHINE SULFATE IR 15 MG T: 15 | 5 days supply | Qty: 60 | Fill #0

## 2016-08-07 NOTE — Telephone Encounter (Signed)
I called the patient to discuss how he's been feeling since his completion of radiotherapy. In summary this is a pleasant gentleman with a history of stage IV non-small cell lung cancer currently receiving carboplatin and Alimta with [name]. He completed radiotherapy to T12 L3 between 05/14/2016 - 05/27/2016, and received 30 Gy in 10 fractions. He reports that although he still is having some discomfort in the back, and knees, he recently had a prescription for morphine filled by [name]. He is scheduled for another treatment on Monday and 25 [name] prior to this. His CT scan images appear to show improvement of his treated disease and sclerotic changes in the area of previous radiation treatment. At this point there was no new area seen on CT 2 suggest new bony disease, and I believe that we can forego tomorrow's appointment since he is doing well otherwise. He will call us if he has questions or concerns regarding his prior treatment, or as needed per [name].

## 2016-08-08 ENCOUNTER — Ambulatory Visit
Admission: RE | Admit: 2016-08-08 | Discharge: 2016-08-08 | Disposition: A | Discharge status: Home / Self Care | Payer: Self-pay | Source: Ambulatory Visit | Attending: Radiation Oncology | Admitting: Radiation Oncology

## 2016-08-12 ENCOUNTER — Other Ambulatory Visit (HOSPITAL_BASED_OUTPATIENT_CLINIC_OR_DEPARTMENT_OTHER): Payer: Medicaid Other

## 2016-08-12 ENCOUNTER — Ambulatory Visit: Payer: Medicaid Other

## 2016-08-12 ENCOUNTER — Ambulatory Visit: Payer: Medicaid Other | Admitting: Nutrition

## 2016-08-12 ENCOUNTER — Ambulatory Visit (HOSPITAL_BASED_OUTPATIENT_CLINIC_OR_DEPARTMENT_OTHER): Payer: Medicaid Other | Admitting: Internal Medicine

## 2016-08-12 ENCOUNTER — Encounter: Payer: Self-pay | Admitting: Internal Medicine

## 2016-08-12 ENCOUNTER — Ambulatory Visit (HOSPITAL_BASED_OUTPATIENT_CLINIC_OR_DEPARTMENT_OTHER): Payer: Medicaid Other

## 2016-08-12 ENCOUNTER — Other Ambulatory Visit: Payer: Self-pay | Admitting: Internal Medicine

## 2016-08-12 ENCOUNTER — Telehealth: Payer: Self-pay | Admitting: Internal Medicine

## 2016-08-12 ENCOUNTER — Other Ambulatory Visit: Payer: Self-pay | Admitting: Medical Oncology

## 2016-08-12 VITALS — BP 142/80 | HR 70

## 2016-08-12 VITALS — BP 144/82 | HR 75 | Temp 98.0°F | Resp 18 | Ht 71.0 in | Wt 132.8 lb

## 2016-08-12 DIAGNOSIS — C7951 Secondary malignant neoplasm of bone: Secondary | ICD-10-CM

## 2016-08-12 DIAGNOSIS — Z5111 Encounter for antineoplastic chemotherapy: Secondary | ICD-10-CM

## 2016-08-12 DIAGNOSIS — C3492 Malignant neoplasm of unspecified part of left bronchus or lung: Secondary | ICD-10-CM

## 2016-08-12 DIAGNOSIS — Z95828 Presence of other vascular implants and grafts: Secondary | ICD-10-CM

## 2016-08-12 DIAGNOSIS — R109 Unspecified abdominal pain: Secondary | ICD-10-CM

## 2016-08-12 DIAGNOSIS — J449 Chronic obstructive pulmonary disease, unspecified: Secondary | ICD-10-CM | POA: Diagnosis not present

## 2016-08-12 DIAGNOSIS — Z5112 Encounter for antineoplastic immunotherapy: Secondary | ICD-10-CM

## 2016-08-12 LAB — COMPREHENSIVE METABOLIC PANEL
ALK PHOS: 79 U/L (ref 40–150)
ALT: 23 U/L (ref 0–55)
ANION GAP: 7 meq/L (ref 3–11)
AST: 23 U/L (ref 5–34)
Albumin: 3.4 g/dL — ABNORMAL LOW (ref 3.5–5.0)
BILIRUBIN TOTAL: 0.34 mg/dL (ref 0.20–1.20)
BUN: 9 mg/dL (ref 7.0–26.0)
CO2: 29 meq/L (ref 22–29)
Calcium: 9.6 mg/dL (ref 8.4–10.4)
Chloride: 106 mEq/L (ref 98–109)
Creatinine: 0.8 mg/dL (ref 0.7–1.3)
Glucose: 104 mg/dl (ref 70–140)
Potassium: 3.8 mEq/L (ref 3.5–5.1)
Sodium: 142 mEq/L (ref 136–145)
TOTAL PROTEIN: 7.2 g/dL (ref 6.4–8.3)

## 2016-08-12 LAB — CBC WITH DIFFERENTIAL/PLATELET
BASO%: 0.3 % (ref 0.0–2.0)
Basophils Absolute: 0 10*3/uL (ref 0.0–0.1)
EOS ABS: 0 10*3/uL (ref 0.0–0.5)
EOS%: 0.7 % (ref 0.0–7.0)
HCT: 32.7 % — ABNORMAL LOW (ref 38.4–49.9)
HGB: 11 g/dL — ABNORMAL LOW (ref 13.0–17.1)
LYMPH%: 7.4 % — AB (ref 14.0–49.0)
MCH: 31.7 pg (ref 27.2–33.4)
MCHC: 33.7 g/dL (ref 32.0–36.0)
MCV: 94.1 fL (ref 79.3–98.0)
MONO#: 0.8 10*3/uL (ref 0.1–0.9)
MONO%: 18.9 % — ABNORMAL HIGH (ref 0.0–14.0)
NEUT%: 72.7 % (ref 39.0–75.0)
NEUTROS ABS: 2.9 10*3/uL (ref 1.5–6.5)
PLATELETS: 169 10*3/uL (ref 140–400)
RBC: 3.47 10*6/uL — AB (ref 4.20–5.82)
RDW: 20.6 % — ABNORMAL HIGH (ref 11.0–14.6)
WBC: 4 10*3/uL (ref 4.0–10.3)
lymph#: 0.3 10*3/uL — ABNORMAL LOW (ref 0.9–3.3)

## 2016-08-12 LAB — UA PROTEIN, DIPSTICK - CHCC: Protein, ur: NEGATIVE mg/dL

## 2016-08-12 LAB — TECHNOLOGIST REVIEW

## 2016-08-12 MED ORDER — SODIUM CHLORIDE 0.9% FLUSH
10.0000 mL | INTRAVENOUS | Status: DC | PRN
  Administered 2016-08-12: 10 mL
  Filled 2016-08-12: qty 10

## 2016-08-12 MED ORDER — HEPARIN SOD (PORK) LOCK FLUSH 100 UNIT/ML IV SOLN
500.0000 [IU] | Freq: Once | INTRAVENOUS | Status: AC | PRN
  Administered 2016-08-12: 500 [IU]
  Filled 2016-08-12: qty 5

## 2016-08-12 MED ORDER — SODIUM CHLORIDE 0.9 % IV SOLN
510.0000 mg/m2 | Freq: Once | INTRAVENOUS | Status: AC
  Administered 2016-08-12: 900 mg via INTRAVENOUS
  Filled 2016-08-12: qty 20

## 2016-08-12 MED ORDER — SODIUM CHLORIDE 0.9 % IV SOLN
Freq: Once | INTRAVENOUS | Status: AC
  Administered 2016-08-12: 12:00:00 via INTRAVENOUS
  Filled 2016-08-12: qty 5

## 2016-08-12 MED ORDER — SODIUM CHLORIDE 0.9% FLUSH
10.0000 mL | INTRAVENOUS | Status: DC | PRN
  Administered 2016-08-12: 10 mL via INTRAVENOUS
  Filled 2016-08-12: qty 10

## 2016-08-12 MED ORDER — PALONOSETRON HCL INJECTION 0.25 MG/5ML
INTRAVENOUS | Status: AC
  Filled 2016-08-12: qty 5

## 2016-08-12 MED ORDER — SODIUM CHLORIDE 0.9 % IV SOLN
Freq: Once | INTRAVENOUS | Status: AC
  Administered 2016-08-12: 12:00:00 via INTRAVENOUS

## 2016-08-12 MED ORDER — SODIUM CHLORIDE 0.9 % IV SOLN
563.0000 mg | Freq: Once | INTRAVENOUS | Status: AC
  Administered 2016-08-12: 560 mg via INTRAVENOUS
  Filled 2016-08-12: qty 56

## 2016-08-12 MED ORDER — SODIUM CHLORIDE 0.9 % IV SOLN
15.0000 mg/kg | Freq: Once | INTRAVENOUS | Status: AC
  Administered 2016-08-12: 900 mg via INTRAVENOUS
  Filled 2016-08-12: qty 32

## 2016-08-12 MED ORDER — PALONOSETRON HCL INJECTION 0.25 MG/5ML
0.2500 mg | Freq: Once | INTRAVENOUS | Status: AC
  Administered 2016-08-12: 0.25 mg via INTRAVENOUS

## 2016-08-12 NOTE — Patient Instructions (Signed)
Implanted Port Home Guide An implanted port is a type of central line that is placed under the skin. Central lines are used to provide IV access when treatment or nutrition needs to be given through a person's veins. Implanted ports are used for long-term IV access. An implanted port may be placed because:  You need IV medicine that would be irritating to the small veins in your hands or arms.  You need long-term IV medicines, such as antibiotics.  You need IV nutrition for a long period.  You need frequent blood draws for lab tests.  You need dialysis.  Implanted ports are usually placed in the chest area, but they can also be placed in the upper arm, the abdomen, or the leg. An implanted port has two main parts:  Reservoir. The reservoir is round and will appear as a small, raised area under your skin. The reservoir is the part where a needle is inserted to give medicines or draw blood.  Catheter. The catheter is a thin, flexible tube that extends from the reservoir. The catheter is placed into a large vein. Medicine that is inserted into the reservoir goes into the catheter and then into the vein.  How will I care for my incision site? Do not get the incision site wet. Bathe or shower as directed by your health care provider. How is my port accessed? Special steps must be taken to access the port:  Before the port is accessed, a numbing cream can be placed on the skin. This helps numb the skin over the port site.  Your health care provider uses a sterile technique to access the port. ? Your health care provider must put on a mask and sterile gloves. ? The skin over your port is cleaned carefully with an antiseptic and allowed to dry. ? The port is gently pinched between sterile gloves, and a needle is inserted into the port.  Only "non-coring" port needles should be used to access the port. Once the port is accessed, a blood return should be checked. This helps ensure that the port  is in the vein and is not clogged.  If your port needs to remain accessed for a constant infusion, a clear (transparent) bandage will be placed over the needle site. The bandage and needle will need to be changed every week, or as directed by your health care provider.  Keep the bandage covering the needle clean and dry. Do not get it wet. Follow your health care provider's instructions on how to take a shower or bath while the port is accessed.  If your port does not need to stay accessed, no bandage is needed over the port.  What is flushing? Flushing helps keep the port from getting clogged. Follow your health care provider's instructions on how and when to flush the port. Ports are usually flushed with saline solution or a medicine called heparin. The need for flushing will depend on how the port is used.  If the port is used for intermittent medicines or blood draws, the port will need to be flushed: ? After medicines have been given. ? After blood has been drawn. ? As part of routine maintenance.  If a constant infusion is running, the port may not need to be flushed.  How long will my port stay implanted? The port can stay in for as long as your health care provider thinks it is needed. When it is time for the port to come out, surgery will be   done to remove it. The procedure is similar to the one performed when the port was put in. When should I seek immediate medical care? When you have an implanted port, you should seek immediate medical care if:  You notice a bad smell coming from the incision site.  You have swelling, redness, or drainage at the incision site.  You have more swelling or pain at the port site or the surrounding area.  You have a fever that is not controlled with medicine.  This information is not intended to replace advice given to you by your health care provider. Make sure you discuss any questions you have with your health care provider. Document  Released: 01/07/2005 Document Revised: 06/15/2015 Document Reviewed: 09/14/2012 Elsevier Interactive Patient Education  2017 [employer] Inc.  

## 2016-08-12 NOTE — Telephone Encounter (Signed)
Patient already on schedule for weekly lab and lab/fu/tx in 3 weeks as requested per 7/23 los. Gave patient avs report and appointments for July and August. Last date in care plan is 8/13.
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
## 2016-08-12 NOTE — Progress Notes (Signed)
Nutrition follow-up completed with patient receiving treatment for metastatic lung cancer. Weight continues to improve and was documented as 132 pounds increased from 119.5 pounds May 21. Patient reports he is eating well, thanks to his mother and his sister. He no longer drinks oral nutrition supplements because he is eating so well. He denies nutrition impact symptoms.  Nutrition diagnosis: Malnutrition improved.  Intervention:  Patient was educated to continue strategies for adequate calories and protein intake. Encouraged patient to contact me if he develops any nutrition impact symptoms.  Monitoring, evaluation, goals:  Patient will tolerate oral intake to minimize weight loss.  Next visit to be scheduled as needed.  **Disclaimer: This note was dictated with voice recognition software. Similar sounding words can inadvertently be transcribed and this note may contain transcription errors which may not have been corrected upon publication of note.**

## 2016-08-12 NOTE — Progress Notes (Signed)
Per Diane, RN per [name] okay to proceed with treatment today without urine protein. Patient to produce sample before leaving today.

## 2016-08-12 NOTE — Patient Instructions (Signed)
[facility] Discharge Instructions for Patients Receiving Chemotherapy  Today you received the following chemotherapy agents: Avastin, Alimta and Carboplatin   To help prevent nausea and vomiting after your treatment, we encourage you to take your nausea medication as directed.    If you develop nausea and vomiting that is not controlled by your nausea medication, call the clinic.   BELOW ARE SYMPTOMS THAT SHOULD BE REPORTED IMMEDIATELY:  *FEVER GREATER THAN 100.5 F  *CHILLS WITH OR WITHOUT FEVER  NAUSEA AND VOMITING THAT IS NOT CONTROLLED WITH YOUR NAUSEA MEDICATION  *UNUSUAL SHORTNESS OF BREATH  *UNUSUAL BRUISING OR BLEEDING  TENDERNESS IN MOUTH AND THROAT WITH OR WITHOUT PRESENCE OF ULCERS  *URINARY PROBLEMS  *BOWEL PROBLEMS  UNUSUAL RASH Items with * indicate a potential emergency and should be followed up as soon as possible.  Feel free to call the clinic you have any questions or concerns. The clinic phone number is [phone].  Please show the [facility] at check-in to the Emergency Department and triage nurse.

## 2016-08-12 NOTE — Patient Instructions (Signed)
Steps to Quit Smoking Smoking tobacco can be bad for your health. It can also affect almost every organ in your body. Smoking puts you and people around you at risk for many serious long-lasting (chronic) diseases. Quitting smoking is hard, but it is one of the best things that you can do for your health. It is never too late to quit. What are the benefits of quitting smoking? When you quit smoking, you lower your risk for getting serious diseases and conditions. They can include:  Lung cancer or lung disease.  Heart disease.  Stroke.  Heart attack.  Not being able to have children (infertility).  Weak bones (osteoporosis) and broken bones (fractures).  If you have coughing, wheezing, and shortness of breath, those symptoms may get better when you quit. You may also get sick less often. If you are pregnant, quitting smoking can help to lower your chances of having a baby of low birth weight. What can I do to help me quit smoking? Talk with your doctor about what can help you quit smoking. Some things you can do (strategies) include:  Quitting smoking totally, instead of slowly cutting back how much you smoke over a period of time.  Going to in-person counseling. You are more likely to quit if you go to many counseling sessions.  Using resources and support systems, such as: ? Online chats with a counselor. ? Phone quitlines. ? Printed self-help materials. ? Support groups or group counseling. ? Text messaging programs. ? Mobile phone apps or applications.  Taking medicines. Some of these medicines may have nicotine in them. If you are pregnant or breastfeeding, do not take any medicines to quit smoking unless your doctor says it is okay. Talk with your doctor about counseling or other things that can help you.  Talk with your doctor about using more than one strategy at the same time, such as taking medicines while you are also going to in-person counseling. This can help make  quitting easier. What things can I do to make it easier to quit? Quitting smoking might feel very hard at first, but there is a lot that you can do to make it easier. Take these steps:  Talk to your family and friends. Ask them to support and encourage you.  Call phone quitlines, reach out to support groups, or work with a counselor.  Ask people who smoke to not smoke around you.  Avoid places that make you want (trigger) to smoke, such as: ? Bars. ? Parties. ? Smoke-break areas at work.  Spend time with people who do not smoke.  Lower the stress in your life. Stress can make you want to smoke. Try these things to help your stress: ? Getting regular exercise. ? Deep-breathing exercises. ? Yoga. ? Meditating. ? Doing a body scan. To do this, close your eyes, focus on one area of your body at a time from head to toe, and notice which parts of your body are tense. Try to relax the muscles in those areas.  Download or buy apps on your mobile phone or tablet that can help you stick to your quit plan. There are many free apps, such as QuitGuide from the CDC (Centers for Disease Control and Prevention). You can find more support from smokefree.gov and other websites.  This information is not intended to replace advice given to you by your health care provider. Make sure you discuss any questions you have with your health care provider. Document Released: 11/03/2008 Document   Revised: 09/05/2015 Document Reviewed: 05/24/2014 Elsevier Interactive Patient Education  2018 [employer] Inc.  

## 2016-08-14 ENCOUNTER — Ambulatory Visit: Payer: Self-pay

## 2016-08-19 ENCOUNTER — Other Ambulatory Visit: Payer: Self-pay

## 2016-08-19 ENCOUNTER — Ambulatory Visit (INDEPENDENT_AMBULATORY_CARE_PROVIDER_SITE_OTHER): Payer: Medicaid Other | Admitting: Pulmonary Disease

## 2016-08-19 ENCOUNTER — Emergency Department (HOSPITAL_COMMUNITY)
Admission: EM | Admit: 2016-08-19 | Discharge: 2016-08-19 | Disposition: A | Discharge status: Home / Self Care | Payer: Medicaid Other | Attending: Emergency Medicine | Admitting: Emergency Medicine

## 2016-08-19 ENCOUNTER — Emergency Department (HOSPITAL_COMMUNITY): Payer: Medicaid Other

## 2016-08-19 ENCOUNTER — Ambulatory Visit (HOSPITAL_BASED_OUTPATIENT_CLINIC_OR_DEPARTMENT_OTHER): Payer: Medicaid Other

## 2016-08-19 ENCOUNTER — Other Ambulatory Visit: Payer: Self-pay | Admitting: Medical Oncology

## 2016-08-19 ENCOUNTER — Encounter: Payer: Self-pay | Admitting: Pulmonary Disease

## 2016-08-19 ENCOUNTER — Other Ambulatory Visit (HOSPITAL_BASED_OUTPATIENT_CLINIC_OR_DEPARTMENT_OTHER): Payer: Medicaid Other

## 2016-08-19 ENCOUNTER — Encounter (HOSPITAL_COMMUNITY): Payer: Self-pay | Admitting: Emergency Medicine

## 2016-08-19 VITALS — BP 144/88 | HR 64 | Temp 97.7°F | Resp 18

## 2016-08-19 VITALS — BP 118/80 | HR 73 | Ht 71.0 in | Wt 130.6 lb

## 2016-08-19 DIAGNOSIS — C3492 Malignant neoplasm of unspecified part of left bronchus or lung: Secondary | ICD-10-CM | POA: Diagnosis present

## 2016-08-19 DIAGNOSIS — R55 Syncope and collapse: Secondary | ICD-10-CM

## 2016-08-19 DIAGNOSIS — R1084 Generalized abdominal pain: Secondary | ICD-10-CM | POA: Insufficient documentation

## 2016-08-19 DIAGNOSIS — Z79899 Other long term (current) drug therapy: Secondary | ICD-10-CM | POA: Diagnosis not present

## 2016-08-19 DIAGNOSIS — J449 Chronic obstructive pulmonary disease, unspecified: Secondary | ICD-10-CM | POA: Insufficient documentation

## 2016-08-19 DIAGNOSIS — R1013 Epigastric pain: Secondary | ICD-10-CM

## 2016-08-19 DIAGNOSIS — F17210 Nicotine dependence, cigarettes, uncomplicated: Secondary | ICD-10-CM | POA: Insufficient documentation

## 2016-08-19 DIAGNOSIS — R42 Dizziness and giddiness: Secondary | ICD-10-CM | POA: Diagnosis present

## 2016-08-19 DIAGNOSIS — Z5111 Encounter for antineoplastic chemotherapy: Secondary | ICD-10-CM

## 2016-08-19 DIAGNOSIS — Z9181 History of falling: Secondary | ICD-10-CM | POA: Diagnosis not present

## 2016-08-19 DIAGNOSIS — E860 Dehydration: Secondary | ICD-10-CM

## 2016-08-19 DIAGNOSIS — C7951 Secondary malignant neoplasm of bone: Secondary | ICD-10-CM

## 2016-08-19 LAB — CBC WITH DIFFERENTIAL/PLATELET
BASO%: 0 % (ref 0.0–2.0)
BASOS ABS: 0 10*3/uL (ref 0.0–0.1)
Basophils Absolute: 0 10*3/uL (ref 0.0–0.1)
Basophils Relative: 0 %
EOS PCT: 0 %
EOS%: 0.2 % (ref 0.0–7.0)
Eosinophils Absolute: 0 10*3/uL (ref 0.0–0.5)
Eosinophils Absolute: 0 10*3/uL (ref 0.0–0.7)
HEMATOCRIT: 32.7 % — AB (ref 38.4–49.9)
HEMATOCRIT: 30.2 % — AB (ref 39.0–52.0)
HEMOGLOBIN: 10.9 g/dL — AB (ref 13.0–17.1)
Hemoglobin: 10.3 g/dL — ABNORMAL LOW (ref 13.0–17.0)
LYMPH%: 10.9 % — ABNORMAL LOW (ref 14.0–49.0)
LYMPHS ABS: 0.3 10*3/uL — AB (ref 0.7–4.0)
LYMPHS PCT: 5 %
MCH: 31.9 pg (ref 27.2–33.4)
MCH: 31.9 pg (ref 26.0–34.0)
MCHC: 33.3 g/dL (ref 32.0–36.0)
MCHC: 34.1 g/dL (ref 30.0–36.0)
MCV: 95.6 fL (ref 79.3–98.0)
MCV: 93.5 fL (ref 78.0–100.0)
MONO ABS: 0.1 10*3/uL (ref 0.1–1.0)
MONO#: 0.3 10*3/uL (ref 0.1–0.9)
MONO%: 5.1 % (ref 0.0–14.0)
MONOS PCT: 2 %
NEUT%: 83.8 % — ABNORMAL HIGH (ref 39.0–75.0)
NEUTROS ABS: 4.5 10*3/uL (ref 1.5–6.5)
NEUTROS ABS: 5.5 10*3/uL (ref 1.7–7.7)
Neutrophils Relative %: 93 %
Platelets: 132 10*3/uL — ABNORMAL LOW (ref 140–400)
Platelets: 132 10*3/uL — ABNORMAL LOW (ref 150–400)
RBC: 3.42 10*6/uL — ABNORMAL LOW (ref 4.20–5.82)
RBC: 3.23 MIL/uL — ABNORMAL LOW (ref 4.22–5.81)
RDW: 18.9 % — ABNORMAL HIGH (ref 11.0–14.6)
RDW: 18.6 % — AB (ref 11.5–15.5)
WBC: 5.3 10*3/uL (ref 4.0–10.3)
WBC: 5.9 10*3/uL (ref 4.0–10.5)
lymph#: 0.6 10*3/uL — ABNORMAL LOW (ref 0.9–3.3)

## 2016-08-19 LAB — COMPREHENSIVE METABOLIC PANEL
ALBUMIN: 3.9 g/dL (ref 3.5–5.0)
ALK PHOS: 101 U/L (ref 40–150)
ALT: 29 U/L (ref 0–55)
ALT: 29 U/L (ref 17–63)
ANION GAP: 10 (ref 5–15)
AST: 19 U/L (ref 5–34)
AST: 25 U/L (ref 15–41)
Albumin: 4 g/dL (ref 3.5–5.0)
Alkaline Phosphatase: 80 U/L (ref 38–126)
Anion Gap: 8 mEq/L (ref 3–11)
BILIRUBIN TOTAL: 0.4 mg/dL (ref 0.20–1.20)
BILIRUBIN TOTAL: 0.7 mg/dL (ref 0.3–1.2)
BUN: 19.4 mg/dL (ref 7.0–26.0)
BUN: 21 mg/dL — AB (ref 6–20)
CALCIUM: 10.5 mg/dL — AB (ref 8.4–10.4)
CHLORIDE: 101 mmol/L (ref 101–111)
CO2: 30 mEq/L — ABNORMAL HIGH (ref 22–29)
CO2: 27 mmol/L (ref 22–32)
CREATININE: 0.8 mg/dL (ref 0.7–1.3)
Calcium: 9.7 mg/dL (ref 8.9–10.3)
Chloride: 102 mEq/L (ref 98–109)
Creatinine, Ser: 0.74 mg/dL (ref 0.61–1.24)
EGFR: >90 mL/min/{1.73_m2} (ref >90)
GLUCOSE: 89 mg/dL (ref 70–140)
Glucose, Bld: 146 mg/dL — ABNORMAL HIGH (ref 65–99)
POTASSIUM: 4.2 mmol/L (ref 3.5–5.1)
Potassium: 3.6 mEq/L (ref 3.5–5.1)
SODIUM: 139 meq/L (ref 136–145)
Sodium: 138 mmol/L (ref 135–145)
TOTAL PROTEIN: 8 g/dL (ref 6.4–8.3)
Total Protein: 7.3 g/dL (ref 6.5–8.1)

## 2016-08-19 LAB — I-STAT TROPONIN, ED
TROPONIN I, POC: 0 ng/mL (ref 0.00–0.08)
Troponin i, poc: 0 ng/mL (ref 0.00–0.08)

## 2016-08-19 LAB — CBG MONITORING, ED: GLUCOSE-CAPILLARY: 139 mg/dL — AB (ref 65–99)

## 2016-08-19 LAB — UA PROTEIN, DIPSTICK - CHCC: PROTEIN: NEGATIVE mg/dL

## 2016-08-19 MED ORDER — SODIUM CHLORIDE 0.9% FLUSH
10.0000 mL | INTRAVENOUS | Status: DC | PRN
  Administered 2016-08-19: 10 mL
  Filled 2016-08-19: qty 10

## 2016-08-19 MED ORDER — UMECLIDINIUM-VILANTEROL 62.5-25 MCG/INH IN AEPB: 1.0000 | INHALATION_SPRAY | Freq: Every day | RESPIRATORY_TRACT | 0 refills | Status: DC

## 2016-08-19 MED ORDER — MORPHINE SULFATE 15 MG PO TABS: 15.0000 mg | ORAL_TABLET | ORAL | 0 refills | Status: DC | PRN

## 2016-08-19 MED ORDER — HEPARIN SOD (PORK) LOCK FLUSH 100 UNIT/ML IV SOLN
500.0000 [IU] | Freq: Once | INTRAVENOUS | Status: AC | PRN
  Administered 2016-08-19: 500 [IU]
  Filled 2016-08-19: qty 5

## 2016-08-19 MED ORDER — HEPARIN SOD (PORK) LOCK FLUSH 100 UNIT/ML IV SOLN
500.0000 [IU] | Freq: Once | INTRAVENOUS | Status: AC
  Administered 2016-08-19: 500 [IU]
  Filled 2016-08-19: qty 5

## 2016-08-19 MED FILL — MORPHINE SULFATE IR 15 MG T: 15 | 5 days supply | Qty: 60 | Fill #0

## 2016-08-19 NOTE — ED Notes (Signed)
Pt ambulatory and independent at discharge.  Verbalized understanding of discharge instructions 

## 2016-08-19 NOTE — Addendum Note (Signed)
Addended by: Lorretta Harp on: 08/19/2016 11:57 AM   Modules accepted: Orders

## 2016-08-19 NOTE — Assessment & Plan Note (Signed)
Interstitial prominence noted on CT 07/2016 as unlikely to be lymphangitis spread since overall tumor burden and the left suprahilar mass has decreased in size.

## 2016-08-19 NOTE — Progress Notes (Signed)
Subjective:    Patient ID: [name], male    DOB: 1960/02/03, 56 y.o.   MRN: [id number]  HPI  Chief Complaint  Patient presents with  . Consult    Referred by Dr. Tammi Klippel for COPD and lung cancer. Per patient, he has had the cancer for 8 months. Is trying to stop smoking but has stopped drinking.    56 year old smoker presents for evaluation of COPD and lung cancer He was diagnosed with Stage IV (T3, N3, M1c)  adenocarcinoma in February 2018 with negative  mutations, with PDL1 expression 15-20 %. He completed Palliative radiotherapy to  T12 and L3 completed in 05/2016 . He is currently undergoing systemic chemotherapy with carboplatin, Alimta and Avastin  every 3 weeks. Status post 4 cycles. CT chest and abdomen 07/2016 showed reduction in adenopathy in the chest and upper abdomen, decrease in size of left suprahilar mass with interstitial prominence and left upper and lower lobe  He reports dyspnea and exertion on walking in the parking lot. He reports constant wheezing. He denies frequent chest colds or year visits or hospitalizations. He has lost 10-20 pounds since his diagnosis.  He has stopped using Qvar. 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 His father died of lung cancer in his 58s   Spirometry showed severe airway obstruction with a ratio of 59 and FEV1 of 33% with FVC of 44%  Past Medical History:  Diagnosis Date  . Abdominal pain 06/04/2016  . Adenocarcinoma of left lung, stage 4 ([place]) 05/02/2016  . Bronchitis due to tobacco use ([place])   . Cancer ([place])    Lung  . Dehydration 06/04/2016  . Encounter for antineoplastic chemotherapy 05/02/2016  . Goals of care, counseling/discussion 05/02/2016  . Recurrent upper respiratory infection (URI)   . Seizures ([place]) 05/2011   new onset  . Shortness of breath    Past  Surgical History:  Procedure Laterality Date  . IR FLUORO GUIDE PORT INSERTION RIGHT  05/13/2016  . IR US GUIDE VASC ACCESS RIGHT  05/13/2016  . NO PAST SURGERIES      No Known Allergies   Social History   Social History  . Marital status: Single    Spouse name: N/A  . Number of children: N/A  . Years of education: N/A   Occupational History  . Not on file.   Social History Main Topics  . Smoking status: Current Some Day Smoker    Packs/day: 0.00    Years: 30.00    Types: Cigarettes  . Smokeless tobacco: Never Used  . Alcohol use No     Comment: occasional  . Drug use: No  . Sexual activity: Yes   Other Topics Concern  . Not on file   Social History Narrative  . No narrative on file     No family history on file.   Review of Systems neg for any significant sore throat, dysphagia, itching, sneezing, nasal congestion or excess/ purulent secretions, fever, chills, sweats, unintended wt loss, pleuritic or exertional cp, hempoptysis, orthopnea pnd or change in chronic leg swelling.   Also denies presyncope, palpitations, heartburn, abdominal pain, nausea, vomiting, diarrhea or change in bowel or urinary habits, dysuria,hematuria, rash, arthralgias, visual complaints, headache, numbness weakness or ataxia.     Objective:   Physical Exam  Gen. Pleasant, thin, in no distress, normal affect ENT - no lesions, no post nasal drip Neck: No JVD, no thyromegaly, no carotid bruits Lungs: no use of accessory muscles, no dullness to percussion, decreased on left  without rales or rhonchi  Cardiovascular: Rhythm regular, heart sounds  normal, no murmurs or gallops, no peripheral edema Abdomen: soft and non-tender, no hepatosplenomegaly, BS normal. Musculoskeletal: No deformities, no cyanosis, clubbing 1+ Neuro:  alert, non focal         Assessment & Plan:

## 2016-08-19 NOTE — Assessment & Plan Note (Signed)
Will add LABA/ LAMA to his current regimen Sample of ANORO once daily -call us back for prescription if this works   continue on Combivent 2 puffs every 6 hours as needed for shortness of breath  Smoking cessation was again emphasized He will benefit from pulmonary rehabilitation in the future if he is interested

## 2016-08-19 NOTE — ED Provider Notes (Signed)
[facility] DEPT Provider Note   CSN: [id number] Arrival date & time: 08/19/16  1552     History   Chief Complaint Chief Complaint  Patient presents with  . Fall  . Dizziness    HPI ANTWAN PANDYA is a 56 y.o. male.  The history is provided by the patient. No language interpreter was used.  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  At 3:30 this afternoon he was walking with a friend when he developed severe and sudden onset right mid abdominal pain with associated dizziness and lightheaded sensation. He had mild associated chest pressure and diaphoresis. He has chronic shortness of breath and cough and this is at his baseline. He took the pain pills he has been prescribed and his pain is currently resolved and he currently feels better. He denies any fever, vomiting, dysuria, leg swelling or pain. Past Medical History:  Diagnosis Date  . Abdominal pain 06/04/2016  . Adenocarcinoma of left lung, stage 4 ([place]) 05/02/2016  . Bronchitis due to tobacco use ([place])   . Cancer ([place])    Lung  . Dehydration 06/04/2016  . Encounter for antineoplastic chemotherapy 05/02/2016  . Goals of care, counseling/discussion 05/02/2016  . Recurrent upper respiratory infection (URI)   . Seizures ([place]) 05/2011   new onset  . Shortness of breath     Patient Active Problem List   Diagnosis Date Noted  . COPD (chronic obstructive pulmonary disease) ([place]) 08/19/2016  . Dehydration 06/04/2016  . Abdominal pain 06/04/2016  . 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 Adenocarcinoma of left lung, stage 4 ([place]) 05/02/2016  . Goals of care, counseling/discussion 05/02/2016  . Encounter for antineoplastic chemotherapy 05/02/2016  . Tobacco abuse 05/30/2011  . Alcohol abuse 05/30/2011  . Seizure ([place]) 05/28/2011    Past Surgical History:  Procedure Laterality Date  . IR FLUORO GUIDE PORT INSERTION RIGHT   05/13/2016  . IR US GUIDE VASC ACCESS RIGHT  05/13/2016  . NO PAST SURGERIES         Home Medications    Prior to Admission medications   Medication Sig Start Date End Date Taking? Authorizing Provider  albuterol (PROVENTIL HFA;VENTOLIN HFA) 108 (90 Base) MCG/ACT inhaler Inhale 1 puff into the lungs every 6 (six) hours as needed for wheezing or shortness of breath.   Yes [provider]  beclomethasone (QVAR) 40 MCG/ACT inhaler Inhale 2 puffs into the lungs as needed (he ends up taking about 6 times a day).   Yes [provider]  dexamethasone (DECADRON) 4 MG tablet 4 mg by mouth twice a day the day before, day of and day after the chemotherapy every 3 weeks 07/22/16  Yes Curt Bears, MD  folic acid (FOLVITE) 1 MG tablet Take 1 tablet (1 mg total) by mouth daily. 05/08/16  Yes Curt Bears, MD  Ipratropium-Albuterol (COMBIVENT) 20-100 MCG/ACT AERS respimat Inhale 1 puff into the lungs every 6 (six) hours. 07/22/16  Yes Curt Bears, MD  lidocaine-prilocaine (EMLA) cream Apply 1 application topically as needed. 05/08/16  Yes Curt Bears, MD  morphine (MSIR) 15 MG tablet Take 1-2 tablets (15-30 mg total) by mouth every 4 (four) hours as needed for severe pain. 08/19/16  Yes Curt Bears, MD  prochlorperazine (COMPAZINE) 10 MG tablet Take 1 tablet (10 mg total) by mouth every 6 (six) hours as needed for nausea or vomiting. 05/08/16  Yes Curt Bears, MD  umeclidinium-vilanterol (ANORO ELLIPTA) 62.5-25 MCG/INH AEPB Inhale 1 puff into the lungs daily. 08/19/16   Rigoberto Noel, MD    Family History No family history on file.  Social History Social History  Substance Use Topics  . Smoking status: Current Some Day Smoker    Packs/day: 0.00    Years: 30.00    Types: Cigarettes  . Smokeless tobacco: Never Used  . Alcohol use No     Comment: occasional     Allergies   Patient has no known allergies.   Review of Systems Review of Systems    Neurological: Positive for dizziness.  All other systems reviewed and are negative.    Physical Exam Updated Vital Signs BP (!) 149/77 (BP Location: Right Arm)   Pulse 63   Temp 97.8 F (36.6 C) (Oral)   Resp 18   Ht 5\' 11"  (1.803 m)   Wt 59.1 kg (130 lb 6 oz)   SpO2 97%   BMI 18.18 kg/m   Physical Exam  Constitutional: He is oriented to person, place, and time. He appears well-developed and well-nourished.  HENT:  Head: Normocephalic and atraumatic.  Cardiovascular: Normal rate and regular rhythm.   No murmur heard. Pulmonary/Chest: Effort normal. No respiratory distress.  Decreased air movement in left lung base  Abdominal: Soft. There is no rebound and no guarding.  Mild central and right-sided abdominal tenderness without any guarding or rebound  Musculoskeletal: He exhibits no edema or tenderness.  Neurological: He is alert and oriented to person, place, and time.  Skin: Skin is warm and dry.  Psychiatric: He has a normal mood and affect. His behavior is normal.  Nursing note and vitals reviewed.    ED Treatments / Results  Labs (all labs ordered are listed, but only abnormal results are displayed) Labs Reviewed  COMPREHENSIVE METABOLIC PANEL - Abnormal; Notable for the following:       Result Value   Glucose, Bld 146 (*)    BUN 21 (*)    All other components within normal limits  CBC WITH DIFFERENTIAL/PLATELET - Abnormal; Notable for the following:    RBC 3.23 (*)    Hemoglobin 10.3 (*)    HCT 30.2 (*)    RDW 18.6 (*)    Platelets 132 (*)    Lymphs Abs 0.3 (*)    All other components within normal limits  CBG MONITORING, ED - Abnormal; Notable for the following:    Glucose-Capillary 139 (*)    All other components within normal limits  I-STAT TROPONIN, ED  I-STAT TROPONIN, ED    EKG  EKG Interpretation None       Radiology Dg Chest 2 View  Result Date: 08/19/2016 CLINICAL DATA:  Chest pain and shortness of breath for 1 day. EXAM: CHEST  2  VIEW COMPARISON:  CT chest 07/30/2016.  Chest x-ray 05/28/2011. FINDINGS: Lungs are hyperexpanded. Cardiopericardial silhouette is at upper limits of normal for size. Left suprahilar mass again identified. Interstitial opacity at the left base is compatible with interstitial disease seen on recent CT. No focal airspace consolidation or overt airspace pulmonary edema. No evidence for pleural effusion. Right Port-A-Cath tip overlies the distal SVC. The visualized bony structures of the thorax are intact. Telemetry leads overlie the chest. IMPRESSION: 1. Hyperexpansion with left suprahilar mass lesion and interstitial disease at the left base as seen on recent chest CT. 2. No findings to suggest focal pneumonia or pulmonary edema. Electronically Signed   By: Verda Cumins.D.  On: 08/19/2016 19:09    Procedures Procedures (including critical care time)  Medications Ordered in ED Medications  heparin lock flush 100 unit/mL (500 Units Intracatheter Given 08/19/16 2025)     Initial Impression / Assessment and Plan / ED Course  I have reviewed the triage vital signs and the nursing notes.  Pertinent labs & imaging results that were available during my care of the patient were reviewed by me and considered in my medical decision making (see chart for details).     Patient with history of stage IV cancer here for evaluation of abdominal pain, diaphoresis and dizziness that occurred prior to arrival. His symptoms have resolved in the emergency department on initial exam as well as repeat assessment. Labs are stable compared to priors. Presentation is not consistent with ACS, PE, intra-abdominal hemorrhage, acute infectious process. Counseled patient on home care for near-syncope, outpatient follow up and return precautions.  Final Clinical Impressions(s) / ED Diagnoses   Final diagnoses:  Near syncope    New Prescriptions Discharge Medication List as of 08/19/2016  8:11 PM       Quintella Reichert, MD 08/19/16 2313

## 2016-08-19 NOTE — ED Notes (Signed)
ED Provider at bedside. 

## 2016-08-19 NOTE — ED Notes (Signed)
Bed: [facility] Expected date:  Expected time:  Means of arrival:  Comments: EMS-Dizzy-room 3

## 2016-08-19 NOTE — Patient Instructions (Signed)
Implanted Port Home Guide An implanted port is a type of central line that is placed under the skin. Central lines are used to provide IV access when treatment or nutrition needs to be given through a person's veins. Implanted ports are used for long-term IV access. An implanted port may be placed because:  You need IV medicine that would be irritating to the small veins in your hands or arms.  You need long-term IV medicines, such as antibiotics.  You need IV nutrition for a long period.  You need frequent blood draws for lab tests.  You need dialysis.  Implanted ports are usually placed in the chest area, but they can also be placed in the upper arm, the abdomen, or the leg. An implanted port has two main parts:  Reservoir. The reservoir is round and will appear as a small, raised area under your skin. The reservoir is the part where a needle is inserted to give medicines or draw blood.  Catheter. The catheter is a thin, flexible tube that extends from the reservoir. The catheter is placed into a large vein. Medicine that is inserted into the reservoir goes into the catheter and then into the vein.  How will I care for my incision site? Do not get the incision site wet. Bathe or shower as directed by your health care provider. How is my port accessed? Special steps must be taken to access the port:  Before the port is accessed, a numbing cream can be placed on the skin. This helps numb the skin over the port site.  Your health care provider uses a sterile technique to access the port. ? Your health care provider must put on a mask and sterile gloves. ? The skin over your port is cleaned carefully with an antiseptic and allowed to dry. ? The port is gently pinched between sterile gloves, and a needle is inserted into the port.  Only "non-coring" port needles should be used to access the port. Once the port is accessed, a blood return should be checked. This helps ensure that the port  is in the vein and is not clogged.  If your port needs to remain accessed for a constant infusion, a clear (transparent) bandage will be placed over the needle site. The bandage and needle will need to be changed every week, or as directed by your health care provider.  Keep the bandage covering the needle clean and dry. Do not get it wet. Follow your health care provider's instructions on how to take a shower or bath while the port is accessed.  If your port does not need to stay accessed, no bandage is needed over the port.  What is flushing? Flushing helps keep the port from getting clogged. Follow your health care provider's instructions on how and when to flush the port. Ports are usually flushed with saline solution or a medicine called heparin. The need for flushing will depend on how the port is used.  If the port is used for intermittent medicines or blood draws, the port will need to be flushed: ? After medicines have been given. ? After blood has been drawn. ? As part of routine maintenance.  If a constant infusion is running, the port may not need to be flushed.  How long will my port stay implanted? The port can stay in for as long as your health care provider thinks it is needed. When it is time for the port to come out, surgery will be   done to remove it. The procedure is similar to the one performed when the port was put in. When should I seek immediate medical care? When you have an implanted port, you should seek immediate medical care if:  You notice a bad smell coming from the incision site.  You have swelling, redness, or drainage at the incision site.  You have more swelling or pain at the port site or the surrounding area.  You have a fever that is not controlled with medicine.  This information is not intended to replace advice given to you by your health care provider. Make sure you discuss any questions you have with your health care provider. Document  Released: 01/07/2005 Document Revised: 06/15/2015 Document Reviewed: 09/14/2012 Elsevier Interactive Patient Education  2017 [employer] Inc.  

## 2016-08-19 NOTE — Patient Instructions (Signed)
Sample of ANORO once daily -call us back for prescription if this works   continue on Combivent 2 puffs every 6 hours as needed for shortness of breath

## 2016-08-19 NOTE — ED Triage Notes (Signed)
Pt comes from restaurant via EMS with complaints of a fall after feeling dizzy.  Cancer patient.  Had treatment around 1000 this morning and was sent home. No injuries from fall.  Pt reports right sided axillary pain that was present before the pain. Vitals WNL. CBG 174. A&O x4. Neuro intact.

## 2016-08-26 ENCOUNTER — Ambulatory Visit (HOSPITAL_BASED_OUTPATIENT_CLINIC_OR_DEPARTMENT_OTHER): Payer: Medicaid Other

## 2016-08-26 ENCOUNTER — Other Ambulatory Visit (HOSPITAL_BASED_OUTPATIENT_CLINIC_OR_DEPARTMENT_OTHER): Payer: Medicaid Other

## 2016-08-26 DIAGNOSIS — C3492 Malignant neoplasm of unspecified part of left bronchus or lung: Secondary | ICD-10-CM

## 2016-08-26 DIAGNOSIS — E860 Dehydration: Secondary | ICD-10-CM

## 2016-08-26 DIAGNOSIS — C7951 Secondary malignant neoplasm of bone: Secondary | ICD-10-CM | POA: Diagnosis not present

## 2016-08-26 LAB — CBC WITH DIFFERENTIAL/PLATELET
BASO%: 0 % (ref 0.0–2.0)
BASOS ABS: 0 10*3/uL (ref 0.0–0.1)
EOS ABS: 0 10*3/uL (ref 0.0–0.5)
EOS%: 0 % (ref 0.0–7.0)
HCT: 30.7 % — ABNORMAL LOW (ref 38.4–49.9)
HGB: 10.2 g/dL — ABNORMAL LOW (ref 13.0–17.1)
LYMPH%: 7.1 % — AB (ref 14.0–49.0)
MCH: 31.9 pg (ref 27.2–33.4)
MCHC: 33.2 g/dL (ref 32.0–36.0)
MCV: 95.9 fL (ref 79.3–98.0)
MONO#: 0.6 10*3/uL (ref 0.1–0.9)
MONO%: 16.7 % — AB (ref 0.0–14.0)
NEUT#: 2.9 10*3/uL (ref 1.5–6.5)
NEUT%: 76.2 % — AB (ref 39.0–75.0)
NRBC: 1 % — AB (ref 0–0)
PLATELETS: 31 10*3/uL — AB (ref 140–400)
RBC: 3.2 10*6/uL — AB (ref 4.20–5.82)
RDW: 19.1 % — ABNORMAL HIGH (ref 11.0–14.6)
WBC: 3.8 10*3/uL — ABNORMAL LOW (ref 4.0–10.3)
lymph#: 0.3 10*3/uL — ABNORMAL LOW (ref 0.9–3.3)

## 2016-08-26 LAB — COMPREHENSIVE METABOLIC PANEL
ALK PHOS: 102 U/L (ref 40–150)
ALT: 55 U/L (ref 0–55)
ANION GAP: 10 meq/L (ref 3–11)
AST: 33 U/L (ref 5–34)
Albumin: 3.9 g/dL (ref 3.5–5.0)
BILIRUBIN TOTAL: 0.28 mg/dL (ref 0.20–1.20)
BUN: 16.9 mg/dL (ref 7.0–26.0)
CO2: 28 meq/L (ref 22–29)
Calcium: 10.2 mg/dL (ref 8.4–10.4)
Chloride: 103 mEq/L (ref 98–109)
Creatinine: 0.9 mg/dL (ref 0.7–1.3)
Glucose: 96 mg/dl (ref 70–140)
Potassium: 3.8 mEq/L (ref 3.5–5.1)
Sodium: 141 mEq/L (ref 136–145)
TOTAL PROTEIN: 7.9 g/dL (ref 6.4–8.3)

## 2016-08-26 MED ORDER — HEPARIN SOD (PORK) LOCK FLUSH 100 UNIT/ML IV SOLN
500.0000 [IU] | Freq: Once | INTRAVENOUS | Status: AC | PRN
  Administered 2016-08-26: 500 [IU]
  Filled 2016-08-26: qty 5

## 2016-08-26 MED ORDER — SODIUM CHLORIDE 0.9% FLUSH
10.0000 mL | INTRAVENOUS | Status: DC | PRN
  Administered 2016-08-26: 10 mL
  Filled 2016-08-26: qty 10

## 2016-08-29 ENCOUNTER — Telehealth: Payer: Self-pay | Admitting: *Deleted

## 2016-08-29 DIAGNOSIS — C3492 Malignant neoplasm of unspecified part of left bronchus or lung: Secondary | ICD-10-CM

## 2016-08-29 DIAGNOSIS — C7951 Secondary malignant neoplasm of bone: Secondary | ICD-10-CM

## 2016-08-29 DIAGNOSIS — R1013 Epigastric pain: Secondary | ICD-10-CM

## 2016-08-29 DIAGNOSIS — Z5111 Encounter for antineoplastic chemotherapy: Secondary | ICD-10-CM

## 2016-08-29 MED ORDER — MORPHINE SULFATE 15 MG PO TABS: 15.0000 mg | ORAL_TABLET | ORAL | 0 refills | Status: DC | PRN

## 2016-08-29 MED FILL — DEXAMETHASONE 4 MG TABLET: 4 | 21 days supply | Qty: 40 | Fill #1

## 2016-08-29 MED FILL — MORPHINE SULFATE IR 15 MG T: 15 | 5 days supply | Qty: 60 | Fill #0

## 2016-08-29 NOTE — Telephone Encounter (Signed)
"  I hope to have surgery to have all my teeth removed August 21st."  With this Information nurse instructions provided include saline, baking soda gargles, Biotene products, soft toothbrush and dietary precautions.

## 2016-08-29 NOTE — Telephone Encounter (Signed)
Patient here requesting refills for MS IR and dexamethasone.  One refill on file, advised to call pharmacy for dexamethasone refill.  "I used the last three morphine yesterday.  My mouth started hurting last week but got better.  Yesterday after eating, it started hurting again."   Denies eating any inappropriate foods.  This nurse noted several dark burgundy spots on tongue.  Routing to provider for review and any further communication with patient thru collaborative.  Uses [facility].     MS IR last filled on 08-19-2016.

## 2016-09-02 ENCOUNTER — Ambulatory Visit (HOSPITAL_BASED_OUTPATIENT_CLINIC_OR_DEPARTMENT_OTHER): Payer: Medicaid Other

## 2016-09-02 ENCOUNTER — Encounter: Payer: Self-pay | Admitting: Internal Medicine

## 2016-09-02 ENCOUNTER — Ambulatory Visit: Payer: Medicaid Other

## 2016-09-02 ENCOUNTER — Other Ambulatory Visit (HOSPITAL_BASED_OUTPATIENT_CLINIC_OR_DEPARTMENT_OTHER): Payer: Medicaid Other

## 2016-09-02 ENCOUNTER — Ambulatory Visit (HOSPITAL_BASED_OUTPATIENT_CLINIC_OR_DEPARTMENT_OTHER): Payer: Medicaid Other | Admitting: Internal Medicine

## 2016-09-02 ENCOUNTER — Telehealth: Payer: Self-pay | Admitting: Internal Medicine

## 2016-09-02 VITALS — BP 162/89 | HR 58 | Temp 97.7°F | Resp 18 | Ht 71.0 in | Wt 136.0 lb

## 2016-09-02 VITALS — BP 153/81

## 2016-09-02 DIAGNOSIS — C7951 Secondary malignant neoplasm of bone: Secondary | ICD-10-CM

## 2016-09-02 DIAGNOSIS — Z5112 Encounter for antineoplastic immunotherapy: Secondary | ICD-10-CM

## 2016-09-02 DIAGNOSIS — C3492 Malignant neoplasm of unspecified part of left bronchus or lung: Secondary | ICD-10-CM | POA: Diagnosis present

## 2016-09-02 DIAGNOSIS — Z5111 Encounter for antineoplastic chemotherapy: Secondary | ICD-10-CM

## 2016-09-02 DIAGNOSIS — E860 Dehydration: Secondary | ICD-10-CM

## 2016-09-02 DIAGNOSIS — J449 Chronic obstructive pulmonary disease, unspecified: Secondary | ICD-10-CM

## 2016-09-02 LAB — CBC WITH DIFFERENTIAL/PLATELET
BASO%: 0.1 % (ref 0.0–2.0)
Basophils Absolute: 0 10*3/uL (ref 0.0–0.1)
EOS%: 0 % (ref 0.0–7.0)
Eosinophils Absolute: 0 10*3/uL (ref 0.0–0.5)
HCT: 31.1 % — ABNORMAL LOW (ref 38.4–49.9)
HGB: 10.7 g/dL — ABNORMAL LOW (ref 13.0–17.1)
LYMPH#: 0.3 10*3/uL — AB (ref 0.9–3.3)
LYMPH%: 3.8 % — AB (ref 14.0–49.0)
MCH: 33.9 pg — AB (ref 27.2–33.4)
MCHC: 34.4 g/dL (ref 32.0–36.0)
MCV: 98.5 fL — AB (ref 79.3–98.0)
MONO#: 0.5 10*3/uL (ref 0.1–0.9)
MONO%: 8 % (ref 0.0–14.0)
NEUT#: 5.9 10*3/uL (ref 1.5–6.5)
NEUT%: 88.1 % — AB (ref 39.0–75.0)
Platelets: 103 10*3/uL — ABNORMAL LOW (ref 140–400)
RBC: 3.16 10*6/uL — AB (ref 4.20–5.82)
RDW: 23 % — ABNORMAL HIGH (ref 11.0–14.6)
WBC: 6.7 10*3/uL (ref 4.0–10.3)

## 2016-09-02 LAB — COMPREHENSIVE METABOLIC PANEL
ALT: 34 U/L (ref 0–55)
AST: 17 U/L (ref 5–34)
Albumin: 3.6 g/dL (ref 3.5–5.0)
Alkaline Phosphatase: 89 U/L (ref 40–150)
Anion Gap: 10 mEq/L (ref 3–11)
BUN: 12.4 mg/dL (ref 7.0–26.0)
CHLORIDE: 103 meq/L (ref 98–109)
CO2: 28 meq/L (ref 22–29)
Calcium: 10.1 mg/dL (ref 8.4–10.4)
Creatinine: 0.9 mg/dL (ref 0.7–1.3)
GLUCOSE: 115 mg/dL (ref 70–140)
POTASSIUM: 3.7 meq/L (ref 3.5–5.1)
SODIUM: 141 meq/L (ref 136–145)
Total Bilirubin: 0.28 mg/dL (ref 0.20–1.20)
Total Protein: 7.4 g/dL (ref 6.4–8.3)

## 2016-09-02 LAB — TECHNOLOGIST REVIEW

## 2016-09-02 MED ORDER — PALONOSETRON HCL INJECTION 0.25 MG/5ML
0.2500 mg | Freq: Once | INTRAVENOUS | Status: AC
  Administered 2016-09-02: 0.25 mg via INTRAVENOUS

## 2016-09-02 MED ORDER — SODIUM CHLORIDE 0.9% FLUSH
10.0000 mL | INTRAVENOUS | Status: DC | PRN
  Administered 2016-09-02: 10 mL
  Filled 2016-09-02: qty 10

## 2016-09-02 MED ORDER — SODIUM CHLORIDE 0.9 % IV SOLN
Freq: Once | INTRAVENOUS | Status: AC
  Administered 2016-09-02: 12:00:00 via INTRAVENOUS
  Filled 2016-09-02: qty 5

## 2016-09-02 MED ORDER — SODIUM CHLORIDE 0.9 % IV SOLN
510.0000 mg/m2 | Freq: Once | INTRAVENOUS | Status: AC
  Administered 2016-09-02: 900 mg via INTRAVENOUS
  Filled 2016-09-02: qty 20

## 2016-09-02 MED ORDER — CYANOCOBALAMIN 1000 MCG/ML IJ SOLN
1000.0000 ug | Freq: Once | INTRAMUSCULAR | Status: AC
  Administered 2016-09-02: 1000 ug via INTRAMUSCULAR

## 2016-09-02 MED ORDER — PALONOSETRON HCL INJECTION 0.25 MG/5ML
INTRAVENOUS | Status: AC
  Filled 2016-09-02: qty 5

## 2016-09-02 MED ORDER — SODIUM CHLORIDE 0.9 % IV SOLN
514.5000 mg | Freq: Once | INTRAVENOUS | Status: AC
  Administered 2016-09-02: 510 mg via INTRAVENOUS
  Filled 2016-09-02: qty 51

## 2016-09-02 MED ORDER — HEPARIN SOD (PORK) LOCK FLUSH 100 UNIT/ML IV SOLN
500.0000 [IU] | Freq: Once | INTRAVENOUS | Status: AC | PRN
  Administered 2016-09-02: 500 [IU]
  Filled 2016-09-02: qty 5

## 2016-09-02 MED ORDER — CYANOCOBALAMIN 1000 MCG/ML IJ SOLN
INTRAMUSCULAR | Status: AC
  Filled 2016-09-02: qty 1

## 2016-09-02 MED ORDER — SODIUM CHLORIDE 0.9 % IV SOLN
15.0000 mg/kg | Freq: Once | INTRAVENOUS | Status: AC
  Administered 2016-09-02: 900 mg via INTRAVENOUS
  Filled 2016-09-02: qty 32

## 2016-09-02 MED ORDER — SODIUM CHLORIDE 0.9 % IV SOLN
Freq: Once | INTRAVENOUS | Status: AC
  Administered 2016-09-02: 12:00:00 via INTRAVENOUS

## 2016-09-02 NOTE — Telephone Encounter (Signed)
Gave patient avs and calendars for upcoming appts.

## 2016-09-02 NOTE — Patient Instructions (Signed)
[facility] Discharge Instructions for Patients Receiving Chemotherapy  Today you received the following chemotherapy agents: Avastin, Alimta and Carboplatin   To help prevent nausea and vomiting after your treatment, we encourage you to take your nausea medication as directed.    If you develop nausea and vomiting that is not controlled by your nausea medication, call the clinic.   BELOW ARE SYMPTOMS THAT SHOULD BE REPORTED IMMEDIATELY:  *FEVER GREATER THAN 100.5 F  *CHILLS WITH OR WITHOUT FEVER  NAUSEA AND VOMITING THAT IS NOT CONTROLLED WITH YOUR NAUSEA MEDICATION  *UNUSUAL SHORTNESS OF BREATH  *UNUSUAL BRUISING OR BLEEDING  TENDERNESS IN MOUTH AND THROAT WITH OR WITHOUT PRESENCE OF ULCERS  *URINARY PROBLEMS  *BOWEL PROBLEMS  UNUSUAL RASH Items with * indicate a potential emergency and should be followed up as soon as possible.  Feel free to call the clinic you have any questions or concerns. The clinic phone number is [phone].  Please show the [facility] at check-in to the Emergency Department and triage nurse.

## 2016-09-02 NOTE — Progress Notes (Signed)
[facility] [phone]   [phone]  OFFICE PROGRESS NOTE  Patient, No Pcp Per No address on file  DIAGNOSIS: Stage IV (T3, N3, M1c) non-small cell lung cancer, moderately to poorly differentiated adenocarcinoma diagnosed in February 2018 with negative EGFR, ALK, ROS1 and BRAF mutations, with PD L1 expression 15-20 %.  PRIOR THERAPY: Palliative radiotherapy to the T12 and L3 completed on 05/27/2016 under the care of Dr. Tammi Klippel..  CURRENT THERAPY: systemic chemotherapy with carboplatin for AUC of 5, Alimta 500 MG/M2 and Avastin 15 MG/KG every 3 weeks. Status post 5 cycles.  INTERVAL HISTORY: [name] 56 y.o. male returns to the clinic for follow-up visit. The patient is feeling fine today with no specific complaints except for fatigue. He denied having any chest pain, shortness of breath, cough or hemoptysis. He denied having any fever or chills. He has no nausea, vomiting, diarrhea or constipation. He tolerated the last cycle of his treatment fairly well. He is here today for evaluation before starting cycle #6. He requested refill of his pain medication indicating that his medication got wet.  MEDICAL HISTORY: Past Medical History:  Diagnosis Date  . Abdominal pain 06/04/2016  . Adenocarcinoma of left lung, stage 4 ([place]) 05/02/2016  . Bronchitis due to tobacco use ([place])   . Cancer ([place])    Lung  . Dehydration 06/04/2016  . Encounter for antineoplastic chemotherapy 05/02/2016  . Goals of care, counseling/discussion 05/02/2016  . Recurrent upper respiratory infection (URI)   . Seizures ([place]) 05/2011   new onset  . Shortness of breath     ALLERGIES:  has No Known Allergies.  MEDICATIONS:  Current Outpatient Prescriptions  Medication Sig Dispense Refill  . albuterol (PROVENTIL HFA;VENTOLIN HFA) 108 (90 Base) MCG/ACT inhaler Inhale 1 puff into the lungs every 6 (six) hours as needed for wheezing or shortness of breath.    . beclomethasone  (QVAR) 40 MCG/ACT inhaler Inhale 2 puffs into the lungs as needed (he ends up taking about 6 times a day).    Marland Kitchen dexamethasone (DECADRON) 4 MG tablet 4 mg by mouth twice a day the day before, day of and day after the chemotherapy every 3 weeks 40 tablet 1  . folic acid (FOLVITE) 1 MG tablet Take 1 tablet (1 mg total) by mouth daily. 30 tablet 4  . Ipratropium-Albuterol (COMBIVENT) 20-100 MCG/ACT AERS respimat Inhale 1 puff into the lungs every 6 (six) hours. 1 Inhaler 0  . lidocaine-prilocaine (EMLA) cream Apply 1 application topically as needed. 30 g 0  . morphine (MSIR) 15 MG tablet Take 1-2 tablets (15-30 mg total) by mouth every 4 (four) hours as needed for severe pain. 60 tablet 0  . umeclidinium-vilanterol (ANORO ELLIPTA) 62.5-25 MCG/INH AEPB Inhale 1 puff into the lungs daily. 1 each 0  . prochlorperazine (COMPAZINE) 10 MG tablet Take 1 tablet (10 mg total) by mouth every 6 (six) hours as needed for nausea or vomiting. (Patient not taking: Reported on 09/02/2016) 30 tablet 0   No current facility-administered medications for this visit.    Facility-Administered Medications Ordered in Other Visits  Medication Dose Route Frequency Provider Last Rate Last Dose  . sodium chloride flush (NS) 0.9 % injection 10 mL  10 mL Intracatheter PRN Curt Bears, MD   10 mL at 07/22/16 1224    SURGICAL HISTORY:  Past Surgical History:  Procedure Laterality Date  . IR FLUORO GUIDE PORT INSERTION RIGHT  05/13/2016  . IR US GUIDE  VASC ACCESS RIGHT  05/13/2016  . NO PAST SURGERIES      REVIEW OF SYSTEMS:  A comprehensive review of systems was negative except for: Constitutional: positive for fatigue   PHYSICAL EXAMINATION: General appearance: alert, cooperative, fatigued and no distress Head: Normocephalic, without obvious abnormality, atraumatic Neck: no adenopathy, no JVD, supple, symmetrical, trachea midline and thyroid not enlarged, symmetric, no tenderness/mass/nodules Lymph nodes: Cervical,  supraclavicular, and axillary nodes normal. Resp: clear to auscultation bilaterally Back: symmetric, no curvature. ROM normal. No CVA tenderness. Cardio: regular rate and rhythm, S1, S2 normal, no murmur, click, rub or gallop GI: soft, non-tender; bowel sounds normal; no masses,  no organomegaly Extremities: extremities normal, atraumatic, no cyanosis or edema  ECOG PERFORMANCE STATUS: 1 - Symptomatic but completely ambulatory  Blood pressure (!) 162/89, pulse (!) 58, temperature 97.7 F (36.5 C), temperature source Oral, resp. rate 18, height 5' 11" (1.803 m), weight 136 lb (61.7 kg), SpO2 99 %.  LABORATORY DATA: Lab Results  Component Value Date   WBC 6.7 09/02/2016   HGB 10.7 (L) 09/02/2016   HCT 31.1 (L) 09/02/2016   MCV 98.5 (H) 09/02/2016   PLT 103 (L) 09/02/2016      Chemistry      Component Value Date/Time   NA 141 09/02/2016 0915   K 3.7 09/02/2016 0915   CL 101 08/19/2016 1837   CO2 28 09/02/2016 0915   BUN 12.4 09/02/2016 0915   CREATININE 0.9 09/02/2016 0915      Component Value Date/Time   CALCIUM 10.1 09/02/2016 0915   ALKPHOS 89 09/02/2016 0915   AST 17 09/02/2016 0915   ALT 34 09/02/2016 0915   BILITOT 0.28 09/02/2016 0915       RADIOGRAPHIC STUDIES: Dg Chest 2 View  Result Date: 08/19/2016 CLINICAL DATA:  Chest pain and shortness of breath for 1 day. EXAM: CHEST  2 VIEW COMPARISON:  CT chest 07/30/2016.  Chest x-ray 05/28/2011. FINDINGS: Lungs are hyperexpanded. Cardiopericardial silhouette is at upper limits of normal for size. Left suprahilar mass again identified. Interstitial opacity at the left base is compatible with interstitial disease seen on recent CT. No focal airspace consolidation or overt airspace pulmonary edema. No evidence for pleural effusion. Right Port-A-Cath tip overlies the distal SVC. The visualized bony structures of the thorax are intact. Telemetry leads overlie the chest. IMPRESSION: 1. Hyperexpansion with left suprahilar mass  lesion and interstitial disease at the left base as seen on recent chest CT. 2. No findings to suggest focal pneumonia or pulmonary edema. Electronically Signed   By: Misty Stanley M.D.   On: 08/19/2016 19:09    ASSESSMENT AND PLAN:  This is a very pleasant 56 years old African-American male with a stage IV non-small cell lung cancer, adenocarcinoma with no actionable mutations who is currently undergoing systemic chemotherapy with carboplatin, Alimta and Avastin status post 5 cycles and has been tolerating the treatment fairly well. I recommended for the patient to proceed with cycle #6 today as a scheduled. I will see him back for follow-up visit in 3 weeks for evaluation after repeating CT scan of the chest, abdomen and pelvis for restaging of his disease. The patient was supposed to have dental extraction next week and I strongly recommended for him to delay this procedure until completion of his systemic chemotherapy because of the risk of bleeding from Avastin as well as infection from the current systemic chemotherapy. For pain management, I indicated to the patient that he cannot get refill of pain  medication until it's time for the referral and he has the responsibility to maintain his pain medication secure and safe. The patient agreed to the current plan. He was advised to call immediately if he has any concerning symptoms in the interval. The patient voices understanding of current disease status and treatment options and is in agreement with the current care plan. All questions were answered. The patient knows to call the clinic with any problems, questions or concerns. We can certainly see the patient much sooner if necessary.  Disclaimer: This note was dictated with voice recognition software. Similar sounding words can inadvertently be transcribed and may not be corrected upon review.

## 2016-09-02 NOTE — Progress Notes (Signed)
Pt asking for a refill on morphine. Last refill 8/9. He  stated he took some on Friday and then the rest of the pills got wet or sat or Sunday -he could not recall specifically.

## 2016-09-09 ENCOUNTER — Other Ambulatory Visit: Payer: Self-pay | Admitting: Medical Oncology

## 2016-09-09 DIAGNOSIS — C3492 Malignant neoplasm of unspecified part of left bronchus or lung: Secondary | ICD-10-CM

## 2016-09-09 DIAGNOSIS — R1013 Epigastric pain: Secondary | ICD-10-CM

## 2016-09-09 DIAGNOSIS — Z5111 Encounter for antineoplastic chemotherapy: Secondary | ICD-10-CM

## 2016-09-09 DIAGNOSIS — C7951 Secondary malignant neoplasm of bone: Secondary | ICD-10-CM

## 2016-09-09 MED ORDER — MORPHINE SULFATE 15 MG PO TABS: 15.0000 mg | ORAL_TABLET | ORAL | 0 refills | Status: DC | PRN

## 2016-09-09 MED FILL — MORPHINE SULFATE IR 15 MG T: 15 | 10 days supply | Qty: 60 | Fill #0

## 2016-09-19 ENCOUNTER — Ambulatory Visit (HOSPITAL_COMMUNITY)
Admission: RE | Admit: 2016-09-19 | Discharge: 2016-09-19 | Disposition: A | Discharge status: Home / Self Care | Payer: Medicaid Other | Source: Ambulatory Visit | Attending: Internal Medicine | Admitting: Internal Medicine

## 2016-09-19 ENCOUNTER — Encounter (HOSPITAL_COMMUNITY): Payer: Self-pay

## 2016-09-19 DIAGNOSIS — C7802 Secondary malignant neoplasm of left lung: Secondary | ICD-10-CM | POA: Diagnosis not present

## 2016-09-19 DIAGNOSIS — C7801 Secondary malignant neoplasm of right lung: Secondary | ICD-10-CM | POA: Insufficient documentation

## 2016-09-19 DIAGNOSIS — C7951 Secondary malignant neoplasm of bone: Secondary | ICD-10-CM

## 2016-09-19 DIAGNOSIS — I700 Atherosclerosis of aorta: Secondary | ICD-10-CM | POA: Insufficient documentation

## 2016-09-19 DIAGNOSIS — J449 Chronic obstructive pulmonary disease, unspecified: Secondary | ICD-10-CM | POA: Insufficient documentation

## 2016-09-19 DIAGNOSIS — C3492 Malignant neoplasm of unspecified part of left bronchus or lung: Secondary | ICD-10-CM | POA: Insufficient documentation

## 2016-09-19 DIAGNOSIS — Z5111 Encounter for antineoplastic chemotherapy: Secondary | ICD-10-CM | POA: Diagnosis not present

## 2016-09-19 MED ORDER — IOPAMIDOL (ISOVUE-300) INJECTION 61%
100.0000 mL | Freq: Once | INTRAVENOUS | Status: AC | PRN
  Administered 2016-09-19: 100 mL via INTRAVENOUS

## 2016-09-19 MED ORDER — IOPAMIDOL (ISOVUE-300) INJECTION 61%
INTRAVENOUS | Status: AC
  Filled 2016-09-19: qty 100

## 2016-09-25 ENCOUNTER — Ambulatory Visit (HOSPITAL_BASED_OUTPATIENT_CLINIC_OR_DEPARTMENT_OTHER): Payer: Medicaid Other | Admitting: Internal Medicine

## 2016-09-25 ENCOUNTER — Other Ambulatory Visit (HOSPITAL_BASED_OUTPATIENT_CLINIC_OR_DEPARTMENT_OTHER): Payer: Medicaid Other

## 2016-09-25 ENCOUNTER — Encounter: Payer: Self-pay | Admitting: Internal Medicine

## 2016-09-25 VITALS — BP 161/82 | HR 82 | Temp 98.2°F | Resp 18 | Wt 134.7 lb

## 2016-09-25 DIAGNOSIS — Z5111 Encounter for antineoplastic chemotherapy: Secondary | ICD-10-CM

## 2016-09-25 DIAGNOSIS — C7951 Secondary malignant neoplasm of bone: Secondary | ICD-10-CM | POA: Diagnosis not present

## 2016-09-25 DIAGNOSIS — J449 Chronic obstructive pulmonary disease, unspecified: Secondary | ICD-10-CM

## 2016-09-25 DIAGNOSIS — C3492 Malignant neoplasm of unspecified part of left bronchus or lung: Secondary | ICD-10-CM | POA: Diagnosis present

## 2016-09-25 DIAGNOSIS — Z7189 Other specified counseling: Secondary | ICD-10-CM

## 2016-09-25 DIAGNOSIS — R1013 Epigastric pain: Secondary | ICD-10-CM

## 2016-09-25 LAB — CBC WITH DIFFERENTIAL/PLATELET
BASO%: 0.2 % (ref 0.0–2.0)
BASOS ABS: 0 10*3/uL (ref 0.0–0.1)
EOS ABS: 0 10*3/uL (ref 0.0–0.5)
EOS%: 0.5 % (ref 0.0–7.0)
HCT: 34.1 % — ABNORMAL LOW (ref 38.4–49.9)
HGB: 11 g/dL — ABNORMAL LOW (ref 13.0–17.1)
LYMPH%: 7.2 % — AB (ref 14.0–49.0)
MCH: 35.1 pg — AB (ref 27.2–33.4)
MCHC: 32.3 g/dL (ref 32.0–36.0)
MCV: 108.9 fL — AB (ref 79.3–98.0)
MONO#: 0.3 10*3/uL (ref 0.1–0.9)
MONO%: 3 % (ref 0.0–14.0)
NEUT#: 7.5 10*3/uL — ABNORMAL HIGH (ref 1.5–6.5)
NEUT%: 89.1 % — ABNORMAL HIGH (ref 39.0–75.0)
Platelets: 165 10*3/uL (ref 140–400)
RBC: 3.13 10*6/uL — AB (ref 4.20–5.82)
RDW: 21.7 % — ABNORMAL HIGH (ref 11.0–14.6)
WBC: 8.4 10*3/uL (ref 4.0–10.3)
lymph#: 0.6 10*3/uL — ABNORMAL LOW (ref 0.9–3.3)

## 2016-09-25 LAB — COMPREHENSIVE METABOLIC PANEL
ALBUMIN: 3.8 g/dL (ref 3.5–5.0)
ALK PHOS: 95 U/L (ref 40–150)
ALT: 48 U/L (ref 0–55)
AST: 21 U/L (ref 5–34)
Anion Gap: 11 mEq/L (ref 3–11)
BUN: 21.2 mg/dL (ref 7.0–26.0)
CO2: 26 mEq/L (ref 22–29)
Calcium: 10.2 mg/dL (ref 8.4–10.4)
Chloride: 102 mEq/L (ref 98–109)
Creatinine: 1.1 mg/dL (ref 0.7–1.3)
EGFR: 87 mL/min/{1.73_m2} — AB (ref >90)
GLUCOSE: 152 mg/dL — AB (ref 70–140)
POTASSIUM: 4.2 meq/L (ref 3.5–5.1)
SODIUM: 138 meq/L (ref 136–145)
Total Bilirubin: 0.35 mg/dL (ref 0.20–1.20)
Total Protein: 7.8 g/dL (ref 6.4–8.3)

## 2016-09-25 LAB — UA PROTEIN, DIPSTICK - CHCC: Protein, ur: NEGATIVE mg/dL

## 2016-09-25 LAB — TECHNOLOGIST REVIEW

## 2016-09-25 MED ORDER — DEXAMETHASONE 4 MG PO TABS: ORAL_TABLET | ORAL | 1 refills | Status: DC

## 2016-09-25 MED ORDER — MORPHINE SULFATE 15 MG PO TABS: 15.0000 mg | ORAL_TABLET | ORAL | 0 refills | Status: DC | PRN

## 2016-09-25 MED FILL — DEXAMETHASONE 4 MG TABLET: 4 | 30 days supply | Qty: 40 | Fill #0

## 2016-09-25 MED FILL — MORPHINE SULFATE IR 15 MG T: 15 | 10 days supply | Qty: 60 | Fill #0

## 2016-09-25 NOTE — Patient Instructions (Signed)
Steps to Quit Smoking Smoking tobacco can be bad for your health. It can also affect almost every organ in your body. Smoking puts you and people around you at risk for many serious long-lasting (chronic) diseases. Quitting smoking is hard, but it is one of the best things that you can do for your health. It is never too late to quit. What are the benefits of quitting smoking? When you quit smoking, you lower your risk for getting serious diseases and conditions. They can include:  Lung cancer or lung disease.  Heart disease.  Stroke.  Heart attack.  Not being able to have children (infertility).  Weak bones (osteoporosis) and broken bones (fractures).  If you have coughing, wheezing, and shortness of breath, those symptoms may get better when you quit. You may also get sick less often. If you are pregnant, quitting smoking can help to lower your chances of having a baby of low birth weight. What can I do to help me quit smoking? Talk with your doctor about what can help you quit smoking. Some things you can do (strategies) include:  Quitting smoking totally, instead of slowly cutting back how much you smoke over a period of time.  Going to in-person counseling. You are more likely to quit if you go to many counseling sessions.  Using resources and support systems, such as: ? Online chats with a counselor. ? Phone quitlines. ? Printed self-help materials. ? Support groups or group counseling. ? Text messaging programs. ? Mobile phone apps or applications.  Taking medicines. Some of these medicines may have nicotine in them. If you are pregnant or breastfeeding, do not take any medicines to quit smoking unless your doctor says it is okay. Talk with your doctor about counseling or other things that can help you.  Talk with your doctor about using more than one strategy at the same time, such as taking medicines while you are also going to in-person counseling. This can help make  quitting easier. What things can I do to make it easier to quit? Quitting smoking might feel very hard at first, but there is a lot that you can do to make it easier. Take these steps:  Talk to your family and friends. Ask them to support and encourage you.  Call phone quitlines, reach out to support groups, or work with a counselor.  Ask people who smoke to not smoke around you.  Avoid places that make you want (trigger) to smoke, such as: ? Bars. ? Parties. ? Smoke-break areas at work.  Spend time with people who do not smoke.  Lower the stress in your life. Stress can make you want to smoke. Try these things to help your stress: ? Getting regular exercise. ? Deep-breathing exercises. ? Yoga. ? Meditating. ? Doing a body scan. To do this, close your eyes, focus on one area of your body at a time from head to toe, and notice which parts of your body are tense. Try to relax the muscles in those areas.  Download or buy apps on your mobile phone or tablet that can help you stick to your quit plan. There are many free apps, such as QuitGuide from the CDC (Centers for Disease Control and Prevention). You can find more support from smokefree.gov and other websites.  This information is not intended to replace advice given to you by your health care provider. Make sure you discuss any questions you have with your health care provider. Document Released: 11/03/2008 Document   Revised: 09/05/2015 Document Reviewed: 05/24/2014 Elsevier Interactive Patient Education  2018 [employer] Inc.  

## 2016-09-25 NOTE — Progress Notes (Signed)
[place] [phone]   [phone]  OFFICE PROGRESS NOTE  Patient, No Pcp Per No address on file  DIAGNOSIS: Stage IV (T3, N3, M1c) non-small cell lung cancer, moderately to poorly differentiated adenocarcinoma diagnosed in February 2018 with negative EGFR, ALK, ROS1 and BRAF mutations, with PD L1 expression 15-20 %.  PRIOR THERAPY:  1) Palliative radiotherapy to the T12 and L3 completed on 05/27/2016 under the care of Dr. Tammi Klippel. 2) Systemic chemotherapy with carboplatin for AUC of 5, Alimta 500 MG/M2 and Avastin 15 MG/KG every 3 weeks. Status post 6 cycles with partial response.  CURRENT THERAPY: Maintenance treatment with single agent Alimta 500 MG/M2 every 3 weeks. First dose 10/09/2016.  INTERVAL HISTORY: [name] 56 y.o. male returns to the clinic today for follow-up visit. The patient is feeling much better and tolerated the last cycle of his treatment fairly well. He continues to have aching pain in the lower extremities. He is requesting refill of his pain medication. He denied having any chest pain, shortness of breath, cough or hemoptysis. He denied having any fever or chills. He has no nausea, vomiting, diarrhea or constipation. He has no significant weight loss or night sweats. The patient had repeat CT scan of the chest, abdomen and pelvis performed recently and he is here today for evaluation and discussion of his scan results.   MEDICAL HISTORY: Past Medical History:  Diagnosis Date  . Abdominal pain 06/04/2016  . Adenocarcinoma of left lung, stage 4 ([place]) 05/02/2016  . Bronchitis due to tobacco use ([place])   . Cancer ([place])    Lung  . Dehydration 06/04/2016  . Encounter for antineoplastic chemotherapy 05/02/2016  . Goals of care, counseling/discussion 05/02/2016  . Recurrent upper respiratory infection (URI)   . Seizures ([place]) 05/2011   new onset  . Shortness of breath     ALLERGIES:  has No Known  Allergies.  MEDICATIONS:  Current Outpatient Prescriptions  Medication Sig Dispense Refill  . albuterol (PROVENTIL HFA;VENTOLIN HFA) 108 (90 Base) MCG/ACT inhaler Inhale 1 puff into the lungs every 6 (six) hours as needed for wheezing or shortness of breath.    . beclomethasone (QVAR) 40 MCG/ACT inhaler Inhale 2 puffs into the lungs as needed (he ends up taking about 6 times a day).    Marland Kitchen dexamethasone (DECADRON) 4 MG tablet 4 mg by mouth twice a day the day before, day of and day after the chemotherapy every 3 weeks 40 tablet 1  . folic acid (FOLVITE) 1 MG tablet Take 1 tablet (1 mg total) by mouth daily. 30 tablet 4  . Ipratropium-Albuterol (COMBIVENT) 20-100 MCG/ACT AERS respimat Inhale 1 puff into the lungs every 6 (six) hours. 1 Inhaler 0  . lidocaine-prilocaine (EMLA) cream Apply 1 application topically as needed. 30 g 0  . morphine (MSIR) 15 MG tablet Take 1 tablet (15 mg total) by mouth every 4 (four) hours as needed for severe pain. 60 tablet 0  . umeclidinium-vilanterol (ANORO ELLIPTA) 62.5-25 MCG/INH AEPB Inhale 1 puff into the lungs daily. 1 each 0  . prochlorperazine (COMPAZINE) 10 MG tablet Take 1 tablet (10 mg total) by mouth every 6 (six) hours as needed for nausea or vomiting. (Patient not taking: Reported on 09/02/2016) 30 tablet 0   No current facility-administered medications for this visit.    Facility-Administered Medications Ordered in Other Visits  Medication Dose Route Frequency Provider Last Rate Last Dose  . sodium chloride flush (NS) 0.9 %  injection 10 mL  10 mL Intracatheter PRN Curt Bears, MD   10 mL at 07/22/16 3785    SURGICAL HISTORY:  Past Surgical History:  Procedure Laterality Date  . IR FLUORO GUIDE PORT INSERTION RIGHT  05/13/2016  . IR US GUIDE VASC ACCESS RIGHT  05/13/2016  . NO PAST SURGERIES      REVIEW OF SYSTEMS:  Constitutional: positive for fatigue Eyes: negative Ears, nose, mouth, throat, and face: negative Respiratory:  negative Cardiovascular: negative Gastrointestinal: negative Genitourinary:negative Integument/breast: negative Hematologic/lymphatic: negative Musculoskeletal:positive for arthralgias Neurological: negative Behavioral/Psych: negative Endocrine: negative Allergic/Immunologic: negative   PHYSICAL EXAMINATION: General appearance: alert, cooperative, fatigued and no distress Head: Normocephalic, without obvious abnormality, atraumatic Neck: no adenopathy, no JVD, supple, symmetrical, trachea midline and thyroid not enlarged, symmetric, no tenderness/mass/nodules Lymph nodes: Cervical, supraclavicular, and axillary nodes normal. Resp: clear to auscultation bilaterally Back: symmetric, no curvature. ROM normal. No CVA tenderness. Cardio: regular rate and rhythm, S1, S2 normal, no murmur, click, rub or gallop GI: soft, non-tender; bowel sounds normal; no masses,  no organomegaly Extremities: extremities normal, atraumatic, no cyanosis or edema Neurologic: Alert and oriented X 3, normal strength and tone. Normal symmetric reflexes. Normal coordination and gait  ECOG PERFORMANCE STATUS: 1 - Symptomatic but completely ambulatory  Blood pressure (!) 161/82, pulse 82, temperature 98.2 F (36.8 C), temperature source Oral, resp. rate 18, weight 134 lb 11.2 oz (61.1 kg), SpO2 96 %.  LABORATORY DATA: Lab Results  Component Value Date   WBC 8.4 09/25/2016   HGB 11.0 (L) 09/25/2016   HCT 34.1 (L) 09/25/2016   MCV 108.9 (H) 09/25/2016   PLT 165 09/25/2016      Chemistry      Component Value Date/Time   NA 141 09/02/2016 0915   K 3.7 09/02/2016 0915   CL 101 08/19/2016 1837   CO2 28 09/02/2016 0915   BUN 12.4 09/02/2016 0915   CREATININE 0.9 09/02/2016 0915      Component Value Date/Time   CALCIUM 10.1 09/02/2016 0915   ALKPHOS 89 09/02/2016 0915   AST 17 09/02/2016 0915   ALT 34 09/02/2016 0915   BILITOT 0.28 09/02/2016 0915       RADIOGRAPHIC STUDIES: Ct Chest W  Contrast  Result Date: 09/19/2016 CLINICAL DATA:  Stage IV lung adenocarcinoma diagnosed April 2018, status post systemic chemotherapy and palliative radiotherapy to thoracolumbar spine lesions. Restaging. EXAM: CT CHEST, ABDOMEN, AND PELVIS WITH CONTRAST TECHNIQUE: Multidetector CT imaging of the chest, abdomen and pelvis was performed following the standard protocol during bolus administration of intravenous contrast. CONTRAST:  174m ISOVUE-300 IOPAMIDOL (ISOVUE-300) INJECTION 61% COMPARISON:  07/30/2016 CT chest, abdomen and pelvis. FINDINGS: CT CHEST FINDINGS Cardiovascular: Normal heart size. No significant pericardial fluid/thickening. Right internal jugular MediPort terminates at the cavoatrial junction. Great vessels are normal in course and caliber. No central pulmonary emboli. Mediastinum/Nodes: No discrete thyroid nodules. Unremarkable esophagus. No pathologically enlarged axillary, mediastinal or hilar lymph nodes. Lungs/Pleura: No pneumothorax. No pleural effusion. Moderate centrilobular and paraseptal emphysema with diffuse bronchial wall thickening. Central left upper lobe irregular 3.7 x 2.0 cm lung mass (series 4/ image 45), previously 4.3 x 2.8 cm, decreased. Peripheral left upper lobe irregular 0.7 cm satellite nodule (series 4/ image 33), previously 1.1 cm, decreased. Patchy nodular thickening of the peribronchovascular interstitium and interlobular septa in the left lower lobe, mildly decreased since 07/30/2016. Medial right upper lobe 0.3 cm pulmonary nodule (series 4/ image 35), previously 0.5 cm, decreased. No acute consolidative airspace disease or new  significant pulmonary nodules. Musculoskeletal: Diffuse sclerosis of the T12 vertebral body is unchanged. No new thoracic osseous lesions. Minimal thoracic spondylosis. CT ABDOMEN PELVIS FINDINGS Hepatobiliary: Normal liver size. No liver mass. Normal gallbladder with no radiopaque cholelithiasis. No biliary ductal dilatation. Pancreas:  Normal, with no mass or duct dilation. Spleen: Normal size. No mass. Adrenals/Urinary Tract: No discrete adrenal nodules. Normal kidneys with no hydronephrosis and no renal mass. Normal bladder. Stomach/Bowel: Grossly normal stomach. Normal caliber small bowel with no small bowel wall thickening. Normal appendix. Normal large bowel with no diverticulosis, large bowel wall thickening or pericolonic fat stranding. Vascular/Lymphatic: Atherosclerotic nonaneurysmal abdominal aorta. Patent portal, splenic, hepatic and renal veins. No pathologically enlarged lymph nodes in the abdomen or pelvis. Reproductive: Top-normal size prostate. Other: No pneumoperitoneum, ascites or focal fluid collection. Musculoskeletal: Sclerotic 1.0 cm inferior L2 vertebral body lesion is stable. Stable template based lytic lesion in the inferior L3 vertebral body, probably a large Schmorl node. No new focal osseous lesions. Mild lumbar spondylosis . IMPRESSION: 1. Continued reduction in the primary central left upper lobe irregular lung mass, now 3.7 x 2.0 cm . 2. Subcentimeter pulmonary metastases in the bilateral upper lobes are decreased in size . 3. Findings of lymphangitic tumor spread throughout the left lower lung lobe are decreased . 4. Stable diffuse T12 vertebral sclerosis. Stable L2 vertebral sclerotic lesion. 5. No new or progressive metastatic disease in the chest, abdomen or pelvis. 6. Aortic Atherosclerosis (ICD10-I70.0) and Emphysema (ICD10-J43.9). Electronically Signed   By: Ilona Sorrel M.D.   On: 09/19/2016 15:27   Ct Abdomen Pelvis W Contrast  Result Date: 09/19/2016 CLINICAL DATA:  Stage IV lung adenocarcinoma diagnosed April 2018, status post systemic chemotherapy and palliative radiotherapy to thoracolumbar spine lesions. Restaging. EXAM: CT CHEST, ABDOMEN, AND PELVIS WITH CONTRAST TECHNIQUE: Multidetector CT imaging of the chest, abdomen and pelvis was performed following the standard protocol during bolus  administration of intravenous contrast. CONTRAST:  115m ISOVUE-300 IOPAMIDOL (ISOVUE-300) INJECTION 61% COMPARISON:  07/30/2016 CT chest, abdomen and pelvis. FINDINGS: CT CHEST FINDINGS Cardiovascular: Normal heart size. No significant pericardial fluid/thickening. Right internal jugular MediPort terminates at the cavoatrial junction. Great vessels are normal in course and caliber. No central pulmonary emboli. Mediastinum/Nodes: No discrete thyroid nodules. Unremarkable esophagus. No pathologically enlarged axillary, mediastinal or hilar lymph nodes. Lungs/Pleura: No pneumothorax. No pleural effusion. Moderate centrilobular and paraseptal emphysema with diffuse bronchial wall thickening. Central left upper lobe irregular 3.7 x 2.0 cm lung mass (series 4/ image 45), previously 4.3 x 2.8 cm, decreased. Peripheral left upper lobe irregular 0.7 cm satellite nodule (series 4/ image 33), previously 1.1 cm, decreased. Patchy nodular thickening of the peribronchovascular interstitium and interlobular septa in the left lower lobe, mildly decreased since 07/30/2016. Medial right upper lobe 0.3 cm pulmonary nodule (series 4/ image 35), previously 0.5 cm, decreased. No acute consolidative airspace disease or new significant pulmonary nodules. Musculoskeletal: Diffuse sclerosis of the T12 vertebral body is unchanged. No new thoracic osseous lesions. Minimal thoracic spondylosis. CT ABDOMEN PELVIS FINDINGS Hepatobiliary: Normal liver size. No liver mass. Normal gallbladder with no radiopaque cholelithiasis. No biliary ductal dilatation. Pancreas: Normal, with no mass or duct dilation. Spleen: Normal size. No mass. Adrenals/Urinary Tract: No discrete adrenal nodules. Normal kidneys with no hydronephrosis and no renal mass. Normal bladder. Stomach/Bowel: Grossly normal stomach. Normal caliber small bowel with no small bowel wall thickening. Normal appendix. Normal large bowel with no diverticulosis, large bowel wall thickening or  pericolonic fat stranding. Vascular/Lymphatic: Atherosclerotic nonaneurysmal abdominal aorta.  Patent portal, splenic, hepatic and renal veins. No pathologically enlarged lymph nodes in the abdomen or pelvis. Reproductive: Top-normal size prostate. Other: No pneumoperitoneum, ascites or focal fluid collection. Musculoskeletal: Sclerotic 1.0 cm inferior L2 vertebral body lesion is stable. Stable template based lytic lesion in the inferior L3 vertebral body, probably a large Schmorl node. No new focal osseous lesions. Mild lumbar spondylosis . IMPRESSION: 1. Continued reduction in the primary central left upper lobe irregular lung mass, now 3.7 x 2.0 cm . 2. Subcentimeter pulmonary metastases in the bilateral upper lobes are decreased in size . 3. Findings of lymphangitic tumor spread throughout the left lower lung lobe are decreased . 4. Stable diffuse T12 vertebral sclerosis. Stable L2 vertebral sclerotic lesion. 5. No new or progressive metastatic disease in the chest, abdomen or pelvis. 6. Aortic Atherosclerosis (ICD10-I70.0) and Emphysema (ICD10-J43.9). Electronically Signed   By: Ilona Sorrel M.D.   On: 09/19/2016 15:27    ASSESSMENT AND PLAN:  This is a very pleasant 56 years old African-American male with a stage IV non-small cell lung cancer, adenocarcinoma with no actionable mutations. He underwent systemic chemotherapy with carboplatin, Alimta and Avastin status post 6 cycles and has been tolerating the treatment fairly well. He had repeat CT scan of the chest, abdomen and pelvis performed recently. I personally and independently reviewed the scans and discussed the results with the patient today. His scan showed further improvement of his disease. I discussed with him maintenance treatment with single agent Alimta 500 MG/M2 every 3 weeks. I discussed with the patient adverse effect of this treatment including but not limited to alopecia, myelosuppression, nausea and vomiting, peripheral neuropathy,  liver or renal dysfunction. He is expected to start the first dose of this treatment on 10/09/2016. The patient would come back for follow-up visit at that time. For pain management, I gave him a refill of morphine sulfate. For the dental issues, he was advised to contact his dentist for the teeth extraction before starting the next cycle of his treatment. The patient was advised to call immediately if he has any concerning symptoms in the interval. The patient voices understanding of current disease status and treatment options and is in agreement with the current care plan. All questions were answered. The patient knows to call the clinic with any problems, questions or concerns. We can certainly see the patient much sooner if necessary.  Disclaimer: This note was dictated with voice recognition software. Similar sounding words can inadvertently be transcribed and may not be corrected upon review.

## 2016-09-25 NOTE — Progress Notes (Signed)
DISCONTINUE ON PATHWAY REGIMEN - Non-Small Cell Lung     A cycle is every 21 days:     Carboplatin      Pemetrexed      Bevacizumab   **Always confirm dose/schedule in your pharmacy ordering system**    REASON: Other Reason PRIOR TREATMENT: [id number]: Carboplatin AUC=5 + Pemetrexed 500 mg/m2 + Bevacizumab 15 mg/kg q21 Days x 4 Cycles TREATMENT RESPONSE: Partial Response (PR)  START ON PATHWAY REGIMEN - Non-Small Cell Lung     A cycle is every 21 days:     Pemetrexed   **Always confirm dose/schedule in your pharmacy ordering system**    Patient Characteristics: Stage IV Metastatic, Nonsquamous, Maintenance - Chemotherapy/Immunotherapy, PS = 0, 1, Initial Paclitaxel + Carboplatin or Initial Pemetrexed + Platinum Agent AJCC T Category: T3 Current Disease Status: Distant Metastases AJCC N Category: N2 AJCC M Category: M1c AJCC 8 Stage Grouping: IVB Histology: Nonsquamous Cell ROS1 Rearrangement Status: Negative T790M Mutation Status: Not Applicable - EGFR Mutation Negative/Unknown Other Mutations/Biomarkers: No Other Actionable Mutations PD-L1 Expression Status: PD-L1 Positive 1-49% (TPS) Chemotherapy/Immunotherapy LOT: Maintenance Chemotherapy/Immunotherapy Molecular Targeted Therapy: Not Appropriate ALK Translocation Status: Negative Would you be surprised if this patient died  in the next year<= I would NOT be surprised if this patient died in the next year EGFR Mutation Status: Negative/Wild Type BRAF V600E Mutation Status: Negative Performance Status: PS = 0, 1 Intent of Therapy: Non-Curative / Palliative Intent, Discussed with Patient

## 2016-09-27 ENCOUNTER — Telehealth: Payer: Self-pay | Admitting: Internal Medicine

## 2016-09-27 NOTE — Telephone Encounter (Signed)
Spoke to patient regarding upcoming September appointments. °

## 2016-10-01 ENCOUNTER — Other Ambulatory Visit: Payer: Self-pay | Admitting: Medical Oncology

## 2016-10-09 ENCOUNTER — Other Ambulatory Visit (HOSPITAL_BASED_OUTPATIENT_CLINIC_OR_DEPARTMENT_OTHER): Payer: Medicaid Other

## 2016-10-09 ENCOUNTER — Ambulatory Visit (HOSPITAL_BASED_OUTPATIENT_CLINIC_OR_DEPARTMENT_OTHER): Payer: Medicaid Other

## 2016-10-09 ENCOUNTER — Encounter: Payer: Self-pay | Admitting: Internal Medicine

## 2016-10-09 ENCOUNTER — Encounter: Payer: Self-pay | Admitting: Oncology

## 2016-10-09 ENCOUNTER — Ambulatory Visit (HOSPITAL_BASED_OUTPATIENT_CLINIC_OR_DEPARTMENT_OTHER): Payer: Medicaid Other | Admitting: Oncology

## 2016-10-09 VITALS — BP 150/88 | HR 84 | Temp 98.4°F | Resp 18 | Wt 145.7 lb

## 2016-10-09 DIAGNOSIS — M79606 Pain in leg, unspecified: Secondary | ICD-10-CM | POA: Diagnosis not present

## 2016-10-09 DIAGNOSIS — C3492 Malignant neoplasm of unspecified part of left bronchus or lung: Secondary | ICD-10-CM | POA: Diagnosis not present

## 2016-10-09 DIAGNOSIS — Z5111 Encounter for antineoplastic chemotherapy: Secondary | ICD-10-CM

## 2016-10-09 DIAGNOSIS — R1013 Epigastric pain: Secondary | ICD-10-CM

## 2016-10-09 DIAGNOSIS — C7951 Secondary malignant neoplasm of bone: Secondary | ICD-10-CM | POA: Diagnosis not present

## 2016-10-09 LAB — CBC WITH DIFFERENTIAL/PLATELET
BASO%: 0.1 % (ref 0.0–2.0)
BASOS ABS: 0 10*3/uL (ref 0.0–0.1)
EOS%: 0.7 % (ref 0.0–7.0)
Eosinophils Absolute: 0.1 10*3/uL (ref 0.0–0.5)
HCT: 34.5 % — ABNORMAL LOW (ref 38.4–49.9)
HGB: 11.2 g/dL — ABNORMAL LOW (ref 13.0–17.1)
LYMPH%: 13.9 % — ABNORMAL LOW (ref 14.0–49.0)
MCH: 35.6 pg — AB (ref 27.2–33.4)
MCHC: 32.5 g/dL (ref 32.0–36.0)
MCV: 109.5 fL — AB (ref 79.3–98.0)
MONO#: 0.4 10*3/uL (ref 0.1–0.9)
MONO%: 5.8 % (ref 0.0–14.0)
NEUT%: 79.5 % — ABNORMAL HIGH (ref 39.0–75.0)
NEUTROS ABS: 6 10*3/uL (ref 1.5–6.5)
PLATELETS: 161 10*3/uL (ref 140–400)
RBC: 3.15 10*6/uL — ABNORMAL LOW (ref 4.20–5.82)
RDW: 18.1 % — AB (ref 11.0–14.6)
WBC: 7.6 10*3/uL (ref 4.0–10.3)
lymph#: 1.1 10*3/uL (ref 0.9–3.3)

## 2016-10-09 LAB — COMPREHENSIVE METABOLIC PANEL
ALBUMIN: 3.6 g/dL (ref 3.5–5.0)
ALK PHOS: 73 U/L (ref 40–150)
ALT: 23 U/L (ref 0–55)
ANION GAP: 10 meq/L (ref 3–11)
AST: 22 U/L (ref 5–34)
BUN: 12.1 mg/dL (ref 7.0–26.0)
CALCIUM: 9.9 mg/dL (ref 8.4–10.4)
CO2: 29 mEq/L (ref 22–29)
Chloride: 105 mEq/L (ref 98–109)
Creatinine: 0.9 mg/dL (ref 0.7–1.3)
Glucose: 85 mg/dl (ref 70–140)
POTASSIUM: 3.3 meq/L — AB (ref 3.5–5.1)
Sodium: 144 mEq/L (ref 136–145)
TOTAL PROTEIN: 7.2 g/dL (ref 6.4–8.3)
Total Bilirubin: 0.28 mg/dL (ref 0.20–1.20)

## 2016-10-09 LAB — TECHNOLOGIST REVIEW

## 2016-10-09 MED ORDER — PROCHLORPERAZINE MALEATE 10 MG PO TABS
10.0000 mg | ORAL_TABLET | Freq: Once | ORAL | Status: AC
  Administered 2016-10-09: 10 mg via ORAL

## 2016-10-09 MED ORDER — MORPHINE SULFATE 15 MG PO TABS: 15.0000 mg | ORAL_TABLET | ORAL | 0 refills | Status: DC | PRN

## 2016-10-09 MED ORDER — ONDANSETRON HCL 8 MG PO TABS
ORAL_TABLET | ORAL | Status: AC
  Filled 2016-10-09: qty 1

## 2016-10-09 MED ORDER — SODIUM CHLORIDE 0.9% FLUSH
10.0000 mL | INTRAVENOUS | Status: DC | PRN
  Administered 2016-10-09: 10 mL
  Filled 2016-10-09: qty 10

## 2016-10-09 MED ORDER — SODIUM CHLORIDE 0.9 % IV SOLN
Freq: Once | INTRAVENOUS | Status: AC
  Administered 2016-10-09: 13:00:00 via INTRAVENOUS

## 2016-10-09 MED ORDER — SODIUM CHLORIDE 0.9 % IV SOLN
510.0000 mg/m2 | Freq: Once | INTRAVENOUS | Status: AC
  Administered 2016-10-09: 900 mg via INTRAVENOUS
  Filled 2016-10-09: qty 20

## 2016-10-09 MED ORDER — HEPARIN SOD (PORK) LOCK FLUSH 100 UNIT/ML IV SOLN
500.0000 [IU] | Freq: Once | INTRAVENOUS | Status: AC | PRN
  Administered 2016-10-09: 500 [IU]
  Filled 2016-10-09: qty 5

## 2016-10-09 MED ORDER — PROCHLORPERAZINE MALEATE 10 MG PO TABS
ORAL_TABLET | ORAL | Status: AC
  Filled 2016-10-09: qty 1

## 2016-10-09 MED FILL — COMBIVENT RESPIMAT INHAL SP: 20-100 | 30 days supply | Qty: 4 | Fill #0

## 2016-10-09 MED FILL — MORPHINE SULFATE IR 15 MG T: 15 | 10 days supply | Qty: 60 | Fill #0

## 2016-10-09 NOTE — Progress Notes (Signed)
Received PA request for  Combivent inhaler fro [facility].  Called  Medicaid([place]) to initiate PA. Answered clinical questions.  CJ#[id number] approved til 10/04/17. Interaction ID [id number].  Called [facility](Judy) to advise of approval. She states it went through for $3.

## 2016-10-09 NOTE — Assessment & Plan Note (Signed)
This is a very pleasant 56 year old African-American male with a stage IV non-small cell lung cancer, adenocarcinoma with no actionable mutations. He underwent systemic chemotherapy with carboplatin, Alimta and Avastin status post 6 cycles and has been tolerating the treatment fairly well. He had repeat CT scan of the chest, abdomen and pelvis performed recently which showed further improvement of his disease.  He is here today to begin maintenance treatment with single agent Alimta 500 MG/M2 every 3 weeks. Recommend that he proceed with cycle 1 scheduled today. He was reminded to continue folic acid and to take his dexamethasone as directed.  For pain management, I gave him a refill of morphine sulfate. He was reminded to take the morphine as directed and not to exceed the amount prescribed.  Follow-up will be in 3 weeks for evaluation prior to cycle 2 of maintenance Alimta.

## 2016-10-09 NOTE — Progress Notes (Signed)
Johnathan Cancer Follow up:    [name], [name] Johnathan address on file   [name]: Stage IV (T3, N3, M1c) non-small cell lung cancer, moderately to poorly differentiated adenocarcinoma diagnosed in February 2018 with negative EGFR, ALK, ROS1and BRAF mutations, with PDL1 expression 15-20 %.  SUMMARY OF ONCOLOGIC HISTORY: Oncology History   [name] presented to ED in [place] 02/08/16 with cough.  Work up showed left lung mass.         Adenocarcinoma of left lung, stage 4 ([name])   05/02/2016 Initial [name]    Adenocarcinoma of left lung, stage 4 ([name])     05/08/2016 Imaging    CT Chest/Abd/Pelvis IMPRESSION: 1. Large left upper lobe/hilar mass with obstructed/drowned left lung and probable interstitial spread of tumor. 2. Contralateral bulky mediastinal, right hilar lymphadenopathy, paraesophageal and retrocrural adenopathy. Findings suspicious for interstitial spread of tumor in the right lung also. There are also metastatic pulmonary nodules. 3. Moderate-sized complex left pleural effusion, likely malignant. 4. Underlying pulmonary emphysema. 5. Indeterminate segment 6 liver lesion.  Possible metastasis. 6. Osseous metastatic disease.      05/13/2016 -  Radiation Therapy    SIM spine       PRIOR THERAPY:  1) Palliative radiotherapy to the T12 and L3 completed on 05/27/2016 under the care of Dr. Tammi Klippel.Marland Kitchen 2) Systemic chemotherapy with carboplatin for AUC of 5, Alimta 500 MG/M2 and Avastin 15 MG/KG every 3 weeks. Status post 6 cycles with partial response.  CURRENT THERAPY: Maintenance treatment with single agent Alimta 500 MG/M2 every 3 weeks. First dose 10/09/2016.  INTERVAL HISTORY: [name] 56 y.o. male returns for a routine follow-up visit. The [name] is feeling much better and tolerated the last cycle of his treatment fairly well. He continues to have aching pain in the lower extremities. He is requesting refill of his pain medication. He  denied having any chest pain, shortness of breath, cough or hemoptysis. He denied having any fever or chills. He has Johnathan nausea, vomiting, diarrhea or constipation. He has Johnathan significant weight loss or night sweats. The [name] is here for evaluation prior to cycle 1 of maintenance treatment with single agent Alimta.   [name] Active Problem List   [name] Date Noted  . Chronic obstructive pulmonary disease ([name]) 09/02/2016  . COPD (chronic obstructive pulmonary disease) ([name]) 08/19/2016  . Dehydration 06/04/2016  . Abdominal pain 06/04/2016  . 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 Adenocarcinoma of left lung, stage 4 ([name]) 05/02/2016  . Goals of care, counseling/discussion 05/02/2016  . Encounter for antineoplastic chemotherapy 05/02/2016  . Tobacco abuse 05/30/2011  . Alcohol abuse 05/30/2011  . Seizure ([name]) 05/28/2011    has Johnathan Known Allergies.  MEDICAL HISTORY: Past Medical History:  [name] Date  . Abdominal pain 06/04/2016  . Adenocarcinoma of left lung, stage 4 ([name]) 05/02/2016  . Bronchitis due to tobacco use ([name])   . Cancer ([name])    Lung  . Dehydration 06/04/2016  . Encounter for antineoplastic chemotherapy 05/02/2016  . Goals of care, counseling/discussion 05/02/2016  . Recurrent upper respiratory infection (URI)   . Seizures ([name]) 05/2011   new onset  . Shortness of breath     SURGICAL HISTORY: Past Surgical History:  Procedure Laterality Date  . IR FLUORO GUIDE PORT INSERTION RIGHT  05/13/2016  . IR US GUIDE VASC ACCESS RIGHT  05/13/2016  . Johnathan PAST SURGERIES      SOCIAL HISTORY: Social History   Social History  . Marital  status: Single    Spouse name: N/A  . Number of children: N/A  . Years of education: N/A   Occupational History  . Not on file.   Social History Main Topics  . Smoking status: Current Some Day Smoker    Packs/day: 0.00    Years: 30.00    Types: Cigarettes  . Smokeless tobacco: Never Used  . Alcohol use Johnathan     Comment: occasional   . Drug use: Johnathan  . Sexual activity: Yes   Other Topics Concern  . Not on file   Social History Narrative  . Johnathan narrative on file    FAMILY HISTORY: History reviewed. Johnathan pertinent family history.  Review of Systems  Constitutional: Negative.   HENT:  Negative.   Eyes: Negative.   Respiratory: Negative.   Cardiovascular: Negative.   Gastrointestinal: Negative.   Genitourinary: Negative.    Musculoskeletal:       Generalized pain.  Skin: Negative.   Neurological: Negative.   Hematological: Negative.   Psychiatric/Behavioral: Negative.       PHYSICAL EXAMINATION  ECOG PERFORMANCE STATUS: 1 - Symptomatic but completely ambulatory  Vitals:   10/09/16 1129  BP: (!) 150/88  Pulse: 84  Resp: 18  Temp: 98.4 F (36.9 C)  SpO2: 98%    Physical Exam  Constitutional: He is oriented to person, place, and time and well-developed, well-nourished, and in Johnathan distress. Johnathan distress.  HENT:  Head: Normocephalic.  Mouth/Throat: Oropharynx is clear and moist. Johnathan oropharyngeal exudate.  Eyes: Conjunctivae are normal. Right eye exhibits Johnathan discharge. Left eye exhibits Johnathan discharge. Johnathan scleral icterus.  Neck: Normal range of motion. Neck supple.  Cardiovascular: Normal rate, regular rhythm, normal heart sounds and intact distal pulses.   Pulmonary/Chest: Effort normal. Johnathan respiratory distress. He has Johnathan wheezes. He has Johnathan rales.  Abdominal: Soft. Bowel sounds are normal. He exhibits Johnathan distension and Johnathan mass. There is Johnathan tenderness.  Musculoskeletal: Normal range of motion. He exhibits Johnathan edema.  Lymphadenopathy:    He has Johnathan cervical adenopathy.  Neurological: He is alert and oriented to person, place, and time. He exhibits normal muscle tone. Gait normal.  Skin: Skin is warm and dry. Johnathan rash noted. He is not diaphoretic. Johnathan erythema. Johnathan pallor.  Psychiatric: Mood, memory, affect and judgment normal.    LABORATORY DATA:  CBC    Component Value Date/Time   WBC 7.6 10/09/2016  1106   WBC 5.9 08/19/2016 1837   RBC 3.15 (L) 10/09/2016 1106   RBC 3.23 (L) 08/19/2016 1837   HGB 11.2 (L) 10/09/2016 1106   HCT 34.5 (L) 10/09/2016 1106   PLT 161 10/09/2016 1106   MCV 109.5 (H) 10/09/2016 1106   MCH 35.6 (H) 10/09/2016 1106   MCH 31.9 08/19/2016 1837   MCHC 32.5 10/09/2016 1106   MCHC 34.1 08/19/2016 1837   RDW 18.1 (H) 10/09/2016 1106   LYMPHSABS 1.1 10/09/2016 1106   MONOABS 0.4 10/09/2016 1106   EOSABS 0.1 10/09/2016 1106   BASOSABS 0.0 10/09/2016 1106    CMP     Component Value Date/Time   NA 144 10/09/2016 1106   K 3.3 (L) 10/09/2016 1106   CL 101 08/19/2016 1837   CO2 29 10/09/2016 1106   GLUCOSE 85 10/09/2016 1106   BUN 12.1 10/09/2016 1106   CREATININE 0.9 10/09/2016 1106   CALCIUM 9.9 10/09/2016 1106   PROT 7.2 10/09/2016 1106   ALBUMIN 3.6 10/09/2016 1106   AST 22 10/09/2016 1106  ALT 23 10/09/2016 1106   ALKPHOS 73 10/09/2016 1106   BILITOT 0.28 10/09/2016 1106   GFRNONAA >60 08/19/2016 1837   GFRAA >60 08/19/2016 1837    RADIOGRAPHIC STUDIES:  Johnathan results found.  ASSESSMENT and THERAPY PLAN:   Adenocarcinoma of left lung, stage 4 ([facility]) This is a very pleasant 56 year old African-American male with a stage IV non-small cell lung cancer, adenocarcinoma with Johnathan actionable mutations. He underwent systemic chemotherapy with carboplatin, Alimta and Avastin status post 6 cycles and has been tolerating the treatment fairly well. He had repeat CT scan of the chest, abdomen and pelvis performed recently which showed further improvement of his disease.  He is here today to begin maintenance treatment with single agent Alimta 500 MG/M2 every 3 weeks. Recommend that he proceed with cycle 1 scheduled today. He was reminded to continue folic acid and to take his dexamethasone as directed.  For pain management, I gave him a refill of morphine sulfate. He was reminded to take the morphine as directed and not to exceed the amount  prescribed.  Follow-up will be in 3 weeks for evaluation prior to cycle 2 of maintenance Alimta.   Johnathan orders of the defined types were placed in this encounter.   All questions were answered. The [name] knows to call the clinic with any problems, questions or concerns. We can certainly see the [name] much sooner if necessary.  Mikey Bussing, NP 10/09/2016

## 2016-10-09 NOTE — Patient Instructions (Signed)
[facility] Discharge Instructions for Patients Receiving Chemotherapy  Today you received the following chemotherapy agents; Alimta.   To help prevent nausea and vomiting after your treatment, we encourage you to take your nausea medication as directed.    If you develop nausea and vomiting that is not controlled by your nausea medication, call the clinic.   BELOW ARE SYMPTOMS THAT SHOULD BE REPORTED IMMEDIATELY:  *FEVER GREATER THAN 100.5 F  *CHILLS WITH OR WITHOUT FEVER  NAUSEA AND VOMITING THAT IS NOT CONTROLLED WITH YOUR NAUSEA MEDICATION  *UNUSUAL SHORTNESS OF BREATH  *UNUSUAL BRUISING OR BLEEDING  TENDERNESS IN MOUTH AND THROAT WITH OR WITHOUT PRESENCE OF ULCERS  *URINARY PROBLEMS  *BOWEL PROBLEMS  UNUSUAL RASH Items with * indicate a potential emergency and should be followed up as soon as possible.  Feel free to call the clinic you have any questions or concerns. The clinic phone number is [phone].  Please show the CHEMO ALERT CARD at check-in to the Emergency Department and triage nurse.   

## 2016-10-30 ENCOUNTER — Ambulatory Visit (HOSPITAL_BASED_OUTPATIENT_CLINIC_OR_DEPARTMENT_OTHER): Payer: Self-pay

## 2016-10-30 ENCOUNTER — Encounter: Payer: Self-pay | Admitting: Internal Medicine

## 2016-10-30 ENCOUNTER — Ambulatory Visit (HOSPITAL_BASED_OUTPATIENT_CLINIC_OR_DEPARTMENT_OTHER): Payer: Self-pay | Admitting: Internal Medicine

## 2016-10-30 ENCOUNTER — Other Ambulatory Visit (HOSPITAL_BASED_OUTPATIENT_CLINIC_OR_DEPARTMENT_OTHER): Payer: Self-pay

## 2016-10-30 ENCOUNTER — Encounter: Payer: Self-pay | Admitting: Pharmacy Technician

## 2016-10-30 DIAGNOSIS — C3492 Malignant neoplasm of unspecified part of left bronchus or lung: Secondary | ICD-10-CM

## 2016-10-30 DIAGNOSIS — I10 Essential (primary) hypertension: Secondary | ICD-10-CM

## 2016-10-30 DIAGNOSIS — Z5111 Encounter for antineoplastic chemotherapy: Secondary | ICD-10-CM

## 2016-10-30 DIAGNOSIS — C7951 Secondary malignant neoplasm of bone: Secondary | ICD-10-CM

## 2016-10-30 DIAGNOSIS — R1013 Epigastric pain: Secondary | ICD-10-CM

## 2016-10-30 HISTORY — DX: Essential (primary) hypertension: I10

## 2016-10-30 LAB — COMPREHENSIVE METABOLIC PANEL
ALT: 38 U/L (ref 0–55)
AST: 41 U/L — ABNORMAL HIGH (ref 5–34)
Albumin: 3.4 g/dL — ABNORMAL LOW (ref 3.5–5.0)
Alkaline Phosphatase: 87 U/L (ref 40–150)
Anion Gap: 10 mEq/L (ref 3–11)
BUN: 8.5 mg/dL (ref 7.0–26.0)
CALCIUM: 9.8 mg/dL (ref 8.4–10.4)
CHLORIDE: 106 meq/L (ref 98–109)
CO2: 27 meq/L (ref 22–29)
Creatinine: 1 mg/dL (ref 0.7–1.3)
EGFR: >60 mL/min/{1.73_m2} (ref >60)
Glucose: 125 mg/dl (ref 70–140)
POTASSIUM: 4 meq/L (ref 3.5–5.1)
SODIUM: 142 meq/L (ref 136–145)
Total Bilirubin: <0.22 mg/dL (ref 0.20–1.20)
Total Protein: 7.3 g/dL (ref 6.4–8.3)

## 2016-10-30 LAB — CBC WITH DIFFERENTIAL/PLATELET
BASO%: 0.2 % (ref 0.0–2.0)
BASOS ABS: 0 10*3/uL (ref 0.0–0.1)
EOS%: 0.3 % (ref 0.0–7.0)
Eosinophils Absolute: 0 10*3/uL (ref 0.0–0.5)
HEMATOCRIT: 34.1 % — AB (ref 38.4–49.9)
HGB: 11.3 g/dL — ABNORMAL LOW (ref 13.0–17.1)
LYMPH%: 4.4 % — AB (ref 14.0–49.0)
MCH: 36.3 pg — AB (ref 27.2–33.4)
MCHC: 33.2 g/dL (ref 32.0–36.0)
MCV: 109.2 fL — ABNORMAL HIGH (ref 79.3–98.0)
MONO#: 0.2 10*3/uL (ref 0.1–0.9)
MONO%: 4.5 % (ref 0.0–14.0)
NEUT#: 4.6 10*3/uL (ref 1.5–6.5)
NEUT%: 90.6 % — AB (ref 39.0–75.0)
Platelets: 189 10*3/uL (ref 140–400)
RBC: 3.12 10*6/uL — AB (ref 4.20–5.82)
RDW: 18.6 % — ABNORMAL HIGH (ref 11.0–14.6)
WBC: 5.1 10*3/uL (ref 4.0–10.3)
lymph#: 0.2 10*3/uL — ABNORMAL LOW (ref 0.9–3.3)

## 2016-10-30 LAB — UA PROTEIN, DIPSTICK - CHCC: PROTEIN: NEGATIVE mg/dL

## 2016-10-30 MED ORDER — SODIUM CHLORIDE 0.9% FLUSH
10.0000 mL | INTRAVENOUS | Status: DC | PRN
  Administered 2016-10-30: 10 mL
  Filled 2016-10-30: qty 10

## 2016-10-30 MED ORDER — AMLODIPINE BESYLATE 10 MG PO TABS: 10.0000 mg | ORAL_TABLET | Freq: Every day | ORAL | 2 refills | Status: DC

## 2016-10-30 MED ORDER — SODIUM CHLORIDE 0.9 % IV SOLN
Freq: Once | INTRAVENOUS | Status: AC
  Administered 2016-10-30: 10:00:00 via INTRAVENOUS

## 2016-10-30 MED ORDER — PEMETREXED DISODIUM CHEMO INJECTION 500 MG
510.0000 mg/m2 | Freq: Once | INTRAVENOUS | Status: AC
  Administered 2016-10-30: 900 mg via INTRAVENOUS
  Filled 2016-10-30: qty 20

## 2016-10-30 MED ORDER — HEPARIN SOD (PORK) LOCK FLUSH 100 UNIT/ML IV SOLN
500.0000 [IU] | Freq: Once | INTRAVENOUS | Status: AC | PRN
  Administered 2016-10-30: 500 [IU]
  Filled 2016-10-30: qty 5

## 2016-10-30 MED ORDER — PROCHLORPERAZINE MALEATE 10 MG PO TABS
ORAL_TABLET | ORAL | Status: AC
  Filled 2016-10-30: qty 1

## 2016-10-30 MED ORDER — MORPHINE SULFATE 15 MG PO TABS: 15.0000 mg | ORAL_TABLET | ORAL | 0 refills | Status: DC | PRN

## 2016-10-30 MED ORDER — PROCHLORPERAZINE MALEATE 10 MG PO TABS
10.0000 mg | ORAL_TABLET | Freq: Once | ORAL | Status: AC
  Administered 2016-10-30: 10 mg via ORAL

## 2016-10-30 MED FILL — MORPHINE SULFATE IR 15 MG T: 15 | 10 days supply | Qty: 60 | Fill #0

## 2016-10-30 MED FILL — AMLODIPINE BESYLATE 10 MG T: 10 | 30 days supply | Qty: 30 | Fill #0

## 2016-10-30 NOTE — Patient Instructions (Signed)
[facility] Discharge Instructions for Patients Receiving Chemotherapy  Today you received the following chemotherapy agents alimta   To help prevent nausea and vomiting after your treatment, we encourage you to take your nausea medication as directed  If you develop nausea and vomiting that is not controlled by your nausea medication, call the clinic.   BELOW ARE SYMPTOMS THAT SHOULD BE REPORTED IMMEDIATELY:  *FEVER GREATER THAN 100.5 F  *CHILLS WITH OR WITHOUT FEVER  NAUSEA AND VOMITING THAT IS NOT CONTROLLED WITH YOUR NAUSEA MEDICATION  *UNUSUAL SHORTNESS OF BREATH  *UNUSUAL BRUISING OR BLEEDING  TENDERNESS IN MOUTH AND THROAT WITH OR WITHOUT PRESENCE OF ULCERS  *URINARY PROBLEMS  *BOWEL PROBLEMS  UNUSUAL RASH Items with * indicate a potential emergency and should be followed up as soon as possible.  Feel free to call the clinic you have any questions or concerns. The clinic phone number is [phone].

## 2016-10-30 NOTE — Patient Instructions (Signed)
Steps to Quit Smoking Smoking tobacco can be bad for your health. It can also affect almost every organ in your body. Smoking puts you and people around you at risk for many serious long-lasting (chronic) diseases. Quitting smoking is hard, but it is one of the best things that you can do for your health. It is never too late to quit. What are the benefits of quitting smoking? When you quit smoking, you lower your risk for getting serious diseases and conditions. They can include:  Lung cancer or lung disease.  Heart disease.  Stroke.  Heart attack.  Not being able to have children (infertility).  Weak bones (osteoporosis) and broken bones (fractures).  If you have coughing, wheezing, and shortness of breath, those symptoms may get better when you quit. You may also get sick less often. If you are pregnant, quitting smoking can help to lower your chances of having a baby of low birth weight. What can I do to help me quit smoking? Talk with your doctor about what can help you quit smoking. Some things you can do (strategies) include:  Quitting smoking totally, instead of slowly cutting back how much you smoke over a period of time.  Going to in-person counseling. You are more likely to quit if you go to many counseling sessions.  Using resources and support systems, such as: ? Online chats with a counselor. ? Phone quitlines. ? Printed self-help materials. ? Support groups or group counseling. ? Text messaging programs. ? Mobile phone apps or applications.  Taking medicines. Some of these medicines may have nicotine in them. If you are pregnant or breastfeeding, do not take any medicines to quit smoking unless your doctor says it is okay. Talk with your doctor about counseling or other things that can help you.  Talk with your doctor about using more than one strategy at the same time, such as taking medicines while you are also going to in-person counseling. This can help make  quitting easier. What things can I do to make it easier to quit? Quitting smoking might feel very hard at first, but there is a lot that you can do to make it easier. Take these steps:  Talk to your family and friends. Ask them to support and encourage you.  Call phone quitlines, reach out to support groups, or work with a counselor.  Ask people who smoke to not smoke around you.  Avoid places that make you want (trigger) to smoke, such as: ? Bars. ? Parties. ? Smoke-break areas at work.  Spend time with people who do not smoke.  Lower the stress in your life. Stress can make you want to smoke. Try these things to help your stress: ? Getting regular exercise. ? Deep-breathing exercises. ? Yoga. ? Meditating. ? Doing a body scan. To do this, close your eyes, focus on one area of your body at a time from head to toe, and notice which parts of your body are tense. Try to relax the muscles in those areas.  Download or buy apps on your mobile phone or tablet that can help you stick to your quit plan. There are many free apps, such as QuitGuide from the CDC (Centers for Disease Control and Prevention). You can find more support from smokefree.gov and other websites.  This information is not intended to replace advice given to you by your health care provider. Make sure you discuss any questions you have with your health care provider. Document Released: 11/03/2008 Document   Revised: 09/05/2015 Document Reviewed: 05/24/2014 Elsevier Interactive Patient Education  2018 [employer] Inc.  

## 2016-10-30 NOTE — Progress Notes (Signed)
The patient was covered by Medicaid however it has termed and the patient is now Self Pay. I will contact Mr. Porter to sign an application to [employer] for drug assistance on Alimta.

## 2016-10-30 NOTE — Progress Notes (Signed)
[facility] [phone]   Fax:([phone]) [phone]  OFFICE PROGRESS NOTE  Patient, No Pcp Per No address on file  DIAGNOSIS: Stage IV (T3, N3, M1c) non-small cell lung cancer, moderately to poorly differentiated adenocarcinoma diagnosed in February 2018 with negative EGFR, ALK, ROS1 and BRAF mutations, with PD L1 expression 15-20 %.  PRIOR THERAPY:  1) Palliative radiotherapy to the T12 and L3 completed on 05/27/2016 under the care of Dr. Tammi Klippel. 2) Systemic chemotherapy with carboplatin for AUC of 5, Alimta 500 MG/M2 and Avastin 15 MG/KG every 3 weeks. Status post 6 cycles with partial response.  CURRENT THERAPY: Maintenance treatment with single agent Alimta 500 MG/M2 every 3 weeks. First dose 10/09/2016.status post 1 cycle.  INTERVAL HISTORY: [name] 56 y.o. male returns to the clinic today for follow-up visit. The patient tolerated the first cycle of his treatment with maintenance Alimta fairly well. He has fatigue secondary to flulike symptoms 2 weeks ago. He denied having any current fever or chills. He has no nausea, vomiting, diarrhea or constipation. He continues to have hypertension and the patient does not have a primary care physician at this point. He is also requesting refill of his pain medication. He is here today for evaluation before starting cycle #2 of his treatment.   MEDICAL HISTORY: Past Medical History:  Diagnosis Date  . Abdominal pain 06/04/2016  . Adenocarcinoma of left lung, stage 4 ([place]) 05/02/2016  . Bronchitis due to tobacco use ([place])   . Cancer ([place])    Lung  . Dehydration 06/04/2016  . Encounter for antineoplastic chemotherapy 05/02/2016  . Goals of care, counseling/discussion 05/02/2016  . Recurrent upper respiratory infection (URI)   . Seizures ([place]) 05/2011   new onset  . Shortness of breath     ALLERGIES:  has No Known Allergies.  MEDICATIONS:  Current Outpatient Prescriptions  Medication Sig Dispense Refill    . albuterol (PROVENTIL HFA;VENTOLIN HFA) 108 (90 Base) MCG/ACT inhaler Inhale 1 puff into the lungs every 6 (six) hours as needed for wheezing or shortness of breath.    . beclomethasone (QVAR) 40 MCG/ACT inhaler Inhale 2 puffs into the lungs as needed (he ends up taking about 6 times a day).    Marland Kitchen dexamethasone (DECADRON) 4 MG tablet 4 mg by mouth twice a day the day before, day of and day after the chemotherapy every 3 weeks 40 tablet 1  . folic acid (FOLVITE) 1 MG tablet Take 1 tablet (1 mg total) by mouth daily. 30 tablet 4  . Ipratropium-Albuterol (COMBIVENT) 20-100 MCG/ACT AERS respimat Inhale 1 puff into the lungs every 6 (six) hours. 1 Inhaler 0  . lidocaine-prilocaine (EMLA) cream Apply 1 application topically as needed. 30 g 0  . morphine (MSIR) 15 MG tablet Take 1 tablet (15 mg total) by mouth every 4 (four) hours as needed for severe pain. 60 tablet 0  . umeclidinium-vilanterol (ANORO ELLIPTA) 62.5-25 MCG/INH AEPB Inhale 1 puff into the lungs daily. 1 each 0  . prochlorperazine (COMPAZINE) 10 MG tablet Take 1 tablet (10 mg total) by mouth every 6 (six) hours as needed for nausea or vomiting. (Patient not taking: Reported on 10/30/2016) 30 tablet 0   No current facility-administered medications for this visit.     SURGICAL HISTORY:  Past Surgical History:  Procedure Laterality Date  . IR FLUORO GUIDE PORT INSERTION RIGHT  05/13/2016  . IR US GUIDE VASC ACCESS RIGHT  05/13/2016  . NO PAST SURGERIES  REVIEW OF SYSTEMS:  Constitutional: positive for fatigue Eyes: negative Ears, nose, mouth, throat, and face: negative Respiratory: negative Cardiovascular: negative Gastrointestinal: negative Genitourinary:negative Integument/breast: negative Hematologic/lymphatic: negative Musculoskeletal:positive for arthralgias Neurological: negative Behavioral/Psych: negative Endocrine: negative Allergic/Immunologic: negative   PHYSICAL EXAMINATION: General appearance: alert,  cooperative, fatigued and no distress Head: Normocephalic, without obvious abnormality, atraumatic Neck: no adenopathy, no JVD, supple, symmetrical, trachea midline and thyroid not enlarged, symmetric, no tenderness/mass/nodules Lymph nodes: Cervical, supraclavicular, and axillary nodes normal. Resp: clear to auscultation bilaterally Back: symmetric, no curvature. ROM normal. No CVA tenderness. Cardio: regular rate and rhythm, S1, S2 normal, no murmur, click, rub or gallop GI: soft, non-tender; bowel sounds normal; no masses,  no organomegaly Extremities: extremities normal, atraumatic, no cyanosis or edema Neurologic: Alert and oriented X 3, normal strength and tone. Normal symmetric reflexes. Normal coordination and gait  ECOG PERFORMANCE STATUS: 1 - Symptomatic but completely ambulatory  Blood pressure (!) 171/79, pulse 80, temperature 98 F (36.7 C), temperature source Oral, resp. rate 18, height 5' 11"  (1.803 m), weight 138 lb 4.8 oz (62.7 kg), SpO2 96 %.  LABORATORY DATA: Lab Results  Component Value Date   WBC 5.1 10/30/2016   HGB 11.3 (L) 10/30/2016   HCT 34.1 (L) 10/30/2016   MCV 109.2 (H) 10/30/2016   PLT 189 10/30/2016      Chemistry      Component Value Date/Time   NA 142 10/30/2016 0901   K 4.0 10/30/2016 0901   CL 101 08/19/2016 1837   CO2 27 10/30/2016 0901   BUN 8.5 10/30/2016 0901   CREATININE 1.0 10/30/2016 0901      Component Value Date/Time   CALCIUM 9.8 10/30/2016 0901   ALKPHOS 87 10/30/2016 0901   AST 41 (H) 10/30/2016 0901   ALT 38 10/30/2016 0901   BILITOT <0.22 10/30/2016 0901       RADIOGRAPHIC STUDIES: No results found.  ASSESSMENT AND PLAN:  This is a very pleasant 56 years old African-American male with a stage IV non-small cell lung cancer, adenocarcinoma with no actionable mutations. He underwent systemic chemotherapy with carboplatin, Alimta and Avastin status post 6 cycles and has been tolerating the treatment fairly well. He is  currently undergoing maintenance treatment with Alimta 500 MG/M2 every 3 weeks is status post 1 cycle and tolerated the first cycle of his treatment well. I recommended for the patient to proceed with cycle #2 today as a scheduled. For pain management, I gave him a refill of morphine sulfate today. For hypertension, I will start the patient on Norvasc 10 mg by mouth daily. He would come back for follow-up visit in 3 weeks for evaluation before starting the next cycle of his treatment. The patient was advised to call immediately if he has any concerning symptoms in the interval. The patient voices understanding of current disease status and treatment options and is in agreement with the current care plan. All questions were answered. The patient knows to call the clinic with any problems, questions or concerns. We can certainly see the patient much sooner if necessary.  Disclaimer: This note was dictated with voice recognition software. Similar sounding words can inadvertently be transcribed and may not be corrected upon review.

## 2016-11-04 ENCOUNTER — Emergency Department (HOSPITAL_COMMUNITY): Payer: Medicaid Other

## 2016-11-04 ENCOUNTER — Encounter (HOSPITAL_COMMUNITY): Payer: Self-pay | Admitting: Emergency Medicine

## 2016-11-04 ENCOUNTER — Emergency Department (HOSPITAL_COMMUNITY)
Admission: EM | Admit: 2016-11-04 | Discharge: 2016-11-04 | Disposition: A | Discharge status: Home / Self Care | Payer: Medicaid Other | Attending: Emergency Medicine | Admitting: Emergency Medicine

## 2016-11-04 DIAGNOSIS — C3492 Malignant neoplasm of unspecified part of left bronchus or lung: Secondary | ICD-10-CM | POA: Diagnosis not present

## 2016-11-04 DIAGNOSIS — J449 Chronic obstructive pulmonary disease, unspecified: Secondary | ICD-10-CM | POA: Insufficient documentation

## 2016-11-04 DIAGNOSIS — I10 Essential (primary) hypertension: Secondary | ICD-10-CM | POA: Diagnosis not present

## 2016-11-04 DIAGNOSIS — F17210 Nicotine dependence, cigarettes, uncomplicated: Secondary | ICD-10-CM | POA: Diagnosis not present

## 2016-11-04 DIAGNOSIS — R112 Nausea with vomiting, unspecified: Secondary | ICD-10-CM | POA: Insufficient documentation

## 2016-11-04 DIAGNOSIS — R1084 Generalized abdominal pain: Secondary | ICD-10-CM | POA: Diagnosis not present

## 2016-11-04 LAB — COMPREHENSIVE METABOLIC PANEL
ALBUMIN: 4 g/dL (ref 3.5–5.0)
ALK PHOS: 85 U/L (ref 38–126)
ALT: 54 U/L (ref 17–63)
AST: 60 U/L — ABNORMAL HIGH (ref 15–41)
Anion gap: 15 (ref 5–15)
BILIRUBIN TOTAL: 1.2 mg/dL (ref 0.3–1.2)
BUN: 20 mg/dL (ref 6–20)
CHLORIDE: 96 mmol/L — AB (ref 101–111)
CO2: 25 mmol/L (ref 22–32)
Calcium: 9.6 mg/dL (ref 8.9–10.3)
Creatinine, Ser: 1.3 mg/dL — ABNORMAL HIGH (ref 0.61–1.24)
GFR calc non Af Amer: 59 mL/min — ABNORMAL LOW (ref >60)
Glucose, Bld: 98 mg/dL (ref 65–99)
POTASSIUM: 3.9 mmol/L (ref 3.5–5.1)
SODIUM: 136 mmol/L (ref 135–145)
Total Protein: 8 g/dL (ref 6.5–8.1)

## 2016-11-04 LAB — CBC
HEMATOCRIT: 35 % — AB (ref 39.0–52.0)
HEMOGLOBIN: 11.8 g/dL — AB (ref 13.0–17.0)
MCH: 35.8 pg — AB (ref 26.0–34.0)
MCHC: 33.7 g/dL (ref 30.0–36.0)
MCV: 106.1 fL — AB (ref 78.0–100.0)
Platelets: 193 10*3/uL (ref 150–400)
RBC: 3.3 MIL/uL — AB (ref 4.22–5.81)
RDW: 15.9 % — ABNORMAL HIGH (ref 11.5–15.5)
WBC: 7.9 10*3/uL (ref 4.0–10.5)

## 2016-11-04 LAB — URINALYSIS, ROUTINE W REFLEX MICROSCOPIC
Bilirubin Urine: NEGATIVE
GLUCOSE, UA: NEGATIVE mg/dL
HGB URINE DIPSTICK: NEGATIVE
Ketones, ur: 5 mg/dL — AB
Leukocytes, UA: NEGATIVE
Nitrite: NEGATIVE
PH: 6 (ref 5.0–8.0)
Protein, ur: NEGATIVE mg/dL
SPECIFIC GRAVITY, URINE: 1.025 (ref 1.005–1.030)

## 2016-11-04 LAB — LIPASE, BLOOD: Lipase: 14 U/L (ref 11–51)

## 2016-11-04 LAB — I-STAT CG4 LACTIC ACID, ED: LACTIC ACID, VENOUS: 1.34 mmol/L (ref 0.5–1.9)

## 2016-11-04 LAB — TROPONIN I: Troponin I: <0.03 ng/mL (ref <0.03)

## 2016-11-04 MED ORDER — PROMETHAZINE HCL 25 MG/ML IJ SOLN
12.5000 mg | Freq: Once | INTRAMUSCULAR | Status: AC
  Administered 2016-11-04: 12.5 mg via INTRAVENOUS
  Filled 2016-11-04: qty 1

## 2016-11-04 MED ORDER — IOPAMIDOL (ISOVUE-300) INJECTION 61%
100.0000 mL | Freq: Once | INTRAVENOUS | Status: AC | PRN
  Administered 2016-11-04: 100 mL via INTRAVENOUS

## 2016-11-04 MED ORDER — MORPHINE SULFATE (PF) 4 MG/ML IV SOLN
4.0000 mg | Freq: Once | INTRAVENOUS | Status: AC
  Administered 2016-11-04: 4 mg via INTRAVENOUS
  Filled 2016-11-04: qty 1

## 2016-11-04 MED ORDER — SODIUM CHLORIDE 0.9 % IV BOLUS (SEPSIS)
1000.0000 mL | Freq: Once | INTRAVENOUS | Status: AC
  Administered 2016-11-04: 1000 mL via INTRAVENOUS

## 2016-11-04 MED ORDER — IOPAMIDOL (ISOVUE-300) INJECTION 61%
INTRAVENOUS | Status: AC
  Filled 2016-11-04: qty 100

## 2016-11-04 MED ORDER — PROMETHAZINE HCL 25 MG PO TABS: 25.0000 mg | ORAL_TABLET | Freq: Four times a day (QID) | ORAL | 0 refills | Status: DC | PRN

## 2016-11-04 MED ORDER — HEPARIN SOD (PORK) LOCK FLUSH 100 UNIT/ML IV SOLN
500.0000 [IU] | Freq: Once | INTRAVENOUS | Status: AC
  Administered 2016-11-04: 500 [IU]
  Filled 2016-11-04: qty 5

## 2016-11-04 NOTE — ED Triage Notes (Signed)
Pt complaint of n/v for 2 days; pt receives chemotherapy for stage 4 lung cancer.

## 2016-11-04 NOTE — ED Provider Notes (Signed)
Emergency Department Provider Note   I have reviewed the triage vital signs and the nursing notes.   HISTORY  Chief Complaint Emesis   HPI [name] is a 56 y.o. male with PMH of adenocarcinoma (stage 4), HTN, and COPD resents to the emergency pertinent for evaluation of nausea, vomiting, chest pain, abdominal discomfort for the past 2 days. Patient is currently on chemotherapy and radiation for his metastatic lung cancer. His last treatment was 10/30/16. He denies symptoms like this after chemotherapy in the past. He denies any fevers or shaking chills. No blood in the emesis. No diarrhea. His chest pain is worse with vomiting but he does have some residual discomfort in the chest. No modifying factors.    Past Medical History:  Diagnosis Date  . Abdominal pain 06/04/2016  . Adenocarcinoma of left lung, stage 4 ([place]) 05/02/2016  . Bronchitis due to tobacco use ([place])   . Cancer ([place])    Lung  . Dehydration 06/04/2016  . Encounter for antineoplastic chemotherapy 05/02/2016  . Goals of care, counseling/discussion 05/02/2016  . HTN (hypertension) 10/30/2016  . Recurrent upper respiratory infection (URI)   . Seizures ([place]) 05/2011   new onset  . Shortness of breath     Patient Active Problem List   Diagnosis Date Noted  . HTN (hypertension) 10/30/2016  . Chronic obstructive pulmonary disease ([place]) 09/02/2016  . COPD (chronic obstructive pulmonary disease) ([place]) 08/19/2016  . Dehydration 06/04/2016  . Abdominal pain 06/04/2016  . 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 Adenocarcinoma of left lung, stage 4 ([place]) 05/02/2016  . Goals of care, counseling/discussion 05/02/2016  . Encounter for antineoplastic chemotherapy 05/02/2016  . Tobacco abuse 05/30/2011  . Alcohol abuse 05/30/2011  . Seizure ([place]) 05/28/2011    Past Surgical History:  Procedure Laterality Date  . IR FLUORO GUIDE PORT INSERTION RIGHT  05/13/2016  . IR US GUIDE VASC ACCESS RIGHT  05/13/2016  . NO PAST  SURGERIES      Current Outpatient Rx  . Order #: [id number] Class: Normal  . Order #: [id number] Class: Normal  . Order #: [id number] Class: Print  . Order #: [id number] Class: Normal  . Order #: [id number] Class: Print  . Order #: [id number] Class: Normal  . Order #: [id number] Class: Normal  . Order #: [id number] Class: Print  . Order #: [id number] Class: Sample    Allergies Patient has no known allergies.  No family history on file.  Social History Social History  Substance Use Topics  . Smoking status: Current Some Day Smoker    Packs/day: 0.00    Years: 30.00    Types: Cigarettes  . Smokeless tobacco: Never Used  . Alcohol use No     Comment: occasional    Review of Systems  Constitutional: No fever/chills Eyes: No visual changes. ENT: No sore throat. Cardiovascular: Positive chest pain. Respiratory: Denies shortness of breath. Gastrointestinal: Positive abdominal pain. Positive nausea and vomiting.  No diarrhea.  No constipation. Genitourinary: Negative for dysuria. Musculoskeletal: Negative for back pain. Skin: Negative for rash. Neurological: Negative for headaches, focal weakness or numbness.  10-point ROS otherwise negative.  ____________________________________________   PHYSICAL EXAM:  VITAL SIGNS: ED Triage Vitals  Enc Vitals Group     BP 11/04/16 0915 117/89     Pulse Rate 11/04/16 0915 (!) 128     Resp 11/04/16 0915 16     Temp 11/04/16 0915 98.1 F (36.7 C)     Temp Source 11/04/16 0915 Oral     SpO2 11/04/16 0915 94 %  Weight 11/04/16 0915 138 lb (62.6 kg)     Height 11/04/16 0915 5\' 11"  (1.803 m)     Pain Score 11/04/16 0925 9   Constitutional: Alert and oriented. Well appearing and in no acute distress. Eyes: Conjunctivae are normal.  Head: Atraumatic. Nose: No congestion/rhinnorhea. Mouth/Throat: Mucous membranes are dry.  Neck: No stridor.  Cardiovascular: Tachycardia. Good peripheral circulation. Grossly normal heart sounds.     Respiratory: Normal respiratory effort. No retractions. Lungs CTAB. Gastrointestinal: Soft with diffuse abdominal tenderness. No rebound or guarding. No distention.  Musculoskeletal: No lower extremity tenderness nor edema. No gross deformities of extremities. Neurologic:  Normal speech and language. No gross focal neurologic deficits are appreciated.  Skin:  Skin is warm, dry and intact. No rash noted.  ____________________________________________   LABS (all labs ordered are listed, but only abnormal results are displayed)  Labs Reviewed  COMPREHENSIVE METABOLIC PANEL - Abnormal; Notable for the following:       Result Value   Chloride 96 (*)    Creatinine, Ser 1.30 (*)    AST 60 (*)    GFR calc non Af Amer 59 (*)    All other components within normal limits  CBC - Abnormal; Notable for the following:    RBC 3.30 (*)    Hemoglobin 11.8 (*)    HCT 35.0 (*)    MCV 106.1 (*)    MCH 35.8 (*)    RDW 15.9 (*)    All other components within normal limits  URINALYSIS, ROUTINE W REFLEX MICROSCOPIC - Abnormal; Notable for the following:    Ketones, ur 5 (*)    All other components within normal limits  CULTURE, BLOOD (ROUTINE X 2)  CULTURE, BLOOD (ROUTINE X 2)  LIPASE, BLOOD  TROPONIN I  I-STAT CG4 LACTIC ACID, ED   ____________________________________________  EKG   EKG Interpretation  Date/Time:  Monday November 04 2016 10:31:33 EDT Ventricular Rate:  110 PR Interval:  134 QRS Duration: 82 QT Interval:  336 QTC Calculation: 454 R Axis:   166 Text Interpretation:  Sinus tachycardia Left posterior fascicular block Anterior infarct , age undetermined Abnormal ECG No STEMI.  Confirmed by Nanda Quinton [phone]) on 11/04/2016 10:46:09 AM       ____________________________________________  RADIOLOGY  Dg Chest 2 View  Result Date: 11/04/2016 CLINICAL DATA:  Worsening cough and shortness of breath. History of lung cancer. EXAM: CHEST  2 VIEW COMPARISON:  Chest x-ray dated  August 19, 2016. FINDINGS: Right chest wall port in unchanged position with the tip at the cavoatrial junction. The cardiomediastinal silhouette is normal in size. Normal pulmonary vascularity. The lungs remain hyperinflated. Left suprahilar mass appears largely unchanged. Increased interstitial markings in the left lower lobe are not significantly changed. No consolidation, pleural effusion, or pneumothorax. No acute osseous abnormality. IMPRESSION: 1. Largely unchanged left suprahilar mass and interstitial disease at the left lung base. No acute cardiopulmonary process. Electronically Signed   By: Titus Dubin M.D.   On: 11/04/2016 11:08   Ct Abdomen Pelvis W Contrast  Result Date: 11/04/2016 CLINICAL DATA:  Generalized abdominal and back pain with nausea and vomiting. History of stage IV lung cancer, currently on chemotherapy. EXAM: CT ABDOMEN AND PELVIS WITH CONTRAST TECHNIQUE: Multidetector CT imaging of the abdomen and pelvis was performed using the standard protocol following bolus administration of intravenous contrast. CONTRAST:  < 100 cc > ISOVUE-300 IOPAMIDOL (ISOVUE-300) INJECTION 61% COMPARISON:  CT chest, abdomen, and pelvis dated September 19, 2016. FINDINGS: Lower chest: Nodular  interlobular septal thickening in the left lower lobe, consistent with lymphangitic carcinomatosis, largely unchanged. Hepatobiliary: No focal liver abnormality is seen. No gallstones, gallbladder wall thickening, or biliary dilatation. Pancreas: Unremarkable. No pancreatic ductal dilatation or surrounding inflammatory changes. Spleen: Normal in size without focal abnormality. Adrenals/Urinary Tract: Adrenal glands are unremarkable. Kidneys are normal, without renal calculi, focal lesion, or hydronephrosis. Symmetric mild proximal urothelial enhancement. Bladder is unremarkable. Stomach/Bowel: Stomach is within normal limits. Appendix appears normal. No evidence of bowel wall thickening, distention, or inflammatory  changes. Vascular/Lymphatic: Aortic atherosclerosis. No enlarged abdominal or pelvic lymph nodes. Reproductive: Prostate is unremarkable. Other: No free fluid or pneumoperitoneum. Musculoskeletal: Unchanged diffuse sclerosis of the T12 vertebral body. Unchanged 1.0 cm sclerotic lesion in the L2 vertebral body. Unchanged 1.8 cm lytic lesion in the L3 vertebral body, likely a large Schmorl's node. No new osseous lesions. No acute fracture. IMPRESSION: 1. No acute intra-abdominal process. 2. Unchanged left lower lobe lymphangitic carcinomatosis. 3. Unchanged diffuse sclerosis of the T12 vertebral body, and 1 cm sclerotic lesion in the L2 vertebral body, suspicious for osseous metastases. 4.  Aortic atherosclerosis (ICD10-I70.0). Electronically Signed   By: Titus Dubin M.D.   On: 11/04/2016 12:25    ____________________________________________   PROCEDURES  Procedure(s) performed:   Procedures  None ____________________________________________   INITIAL IMPRESSION / ASSESSMENT AND PLAN / ED COURSE  Pertinent labs & imaging results that were available during my care of the patient were reviewed by me and considered in my medical decision making (see chart for details).  Patient resents to the emergency department for evaluation of nausea, vomiting, chest pain, abdominal pain for the past 2 days. Last chemotherapy was 5 days ago. He has stage IV adenocarcinoma of the lung. No hypoxemia. Low suspicion for PE in the setting of ongoing abdominal pain with nausea and vomiting the patient is expressing chest discomfort and is significant only tachycardic on arrival. Maintaining oxygen sat well on room air. Plan for CXR with abdominal CT given diffuse abdominal tenderness on exam.   02:09 PM CT with no acute findings. Patient feeling much better after IVF and phenergan. Patient has had soup, crackers, and water and is feeling much better. Will discharge.   At this time, I do not feel there is any  life-threatening condition present. I have reviewed and discussed all results (EKG, imaging, lab, urine as appropriate), exam findings with patient. I have reviewed nursing notes and appropriate previous records.  I feel the patient is safe to be discharged home without further emergent workup. Discussed usual and customary return precautions. Patient and family (if present) verbalize understanding and are comfortable with this plan.  Patient will follow-up with their primary care provider. If they do not have a primary care provider, information for follow-up has been provided to them. All questions have been answered.  ____________________________________________  FINAL CLINICAL IMPRESSION(S) / ED DIAGNOSES  Final diagnoses:  Non-intractable vomiting with nausea, unspecified vomiting type  Generalized abdominal pain     MEDICATIONS GIVEN DURING THIS VISIT:  Medications  sodium chloride 0.9 % bolus 1,000 mL (0 mLs Intravenous Stopped 11/04/16 1154)  promethazine (PHENERGAN) injection 12.5 mg (12.5 mg Intravenous Given 11/04/16 1024)  morphine 4 MG/ML injection 4 mg (4 mg Intravenous Given 11/04/16 1024)  iopamidol (ISOVUE-300) 61 % injection 100 mL (100 mLs Intravenous Contrast Given 11/04/16 1156)  sodium chloride 0.9 % bolus 1,000 mL (0 mLs Intravenous Stopped 11/04/16 1435)  heparin lock flush 100 unit/mL (500 Units Intracatheter Given 11/04/16 1437)  NEW OUTPATIENT MEDICATIONS STARTED DURING THIS VISIT:  Discharge Medication List as of 11/04/2016  2:12 PM    START taking these medications   Details  promethazine (PHENERGAN) 25 MG tablet Take 1 tablet (25 mg total) by mouth every 6 (six) hours as needed for nausea or vomiting., Starting Mon 11/04/2016, Print        Note:  This document was prepared using Dragon voice recognition software and may include unintentional dictation errors.  Nanda Quinton, MD Emergency Medicine    Dariel Pellecchia, Wonda Olds, MD 11/04/16 [phone]

## 2016-11-04 NOTE — ED Notes (Signed)
Patient being discharged with self at this time. Patient ambulatory upon discharge. All questions answered regarding follow-up and prescriptions. Patient stable and ready for discharge at this time.

## 2016-11-04 NOTE — ED Notes (Signed)
Pt is aware of UA. Urinal at bedside

## 2016-11-04 NOTE — Discharge Instructions (Signed)

## 2016-11-06 ENCOUNTER — Telehealth: Payer: Self-pay | Admitting: Internal Medicine

## 2016-11-06 NOTE — Telephone Encounter (Signed)
Spoke with patient regarding the added appts per 10/11 los.

## 2016-11-09 LAB — CULTURE, BLOOD (ROUTINE X 2)
CULTURE: NO GROWTH
Special Requests: ADEQUATE

## 2016-11-11 ENCOUNTER — Ambulatory Visit: Payer: Medicaid Other | Admitting: Pulmonary Disease

## 2016-11-11 ENCOUNTER — Ambulatory Visit: Payer: Self-pay | Admitting: Adult Health

## 2016-11-18 ENCOUNTER — Emergency Department (HOSPITAL_COMMUNITY)
Admission: EM | Admit: 2016-11-18 | Discharge: 2016-11-18 | Disposition: A | Discharge status: Home / Self Care | Payer: Medicaid Other | Attending: Emergency Medicine | Admitting: Emergency Medicine

## 2016-11-18 ENCOUNTER — Encounter (HOSPITAL_COMMUNITY): Payer: Self-pay | Admitting: Emergency Medicine

## 2016-11-18 ENCOUNTER — Emergency Department (HOSPITAL_COMMUNITY): Payer: Medicaid Other

## 2016-11-18 DIAGNOSIS — K2900 Acute gastritis without bleeding: Secondary | ICD-10-CM | POA: Diagnosis not present

## 2016-11-18 DIAGNOSIS — Z79899 Other long term (current) drug therapy: Secondary | ICD-10-CM | POA: Diagnosis not present

## 2016-11-18 DIAGNOSIS — J449 Chronic obstructive pulmonary disease, unspecified: Secondary | ICD-10-CM | POA: Insufficient documentation

## 2016-11-18 DIAGNOSIS — R109 Unspecified abdominal pain: Secondary | ICD-10-CM | POA: Diagnosis present

## 2016-11-18 DIAGNOSIS — R0602 Shortness of breath: Secondary | ICD-10-CM | POA: Insufficient documentation

## 2016-11-18 DIAGNOSIS — R52 Pain, unspecified: Secondary | ICD-10-CM | POA: Insufficient documentation

## 2016-11-18 DIAGNOSIS — M898X9 Other specified disorders of bone, unspecified site: Secondary | ICD-10-CM

## 2016-11-18 DIAGNOSIS — F17210 Nicotine dependence, cigarettes, uncomplicated: Secondary | ICD-10-CM | POA: Diagnosis not present

## 2016-11-18 DIAGNOSIS — I10 Essential (primary) hypertension: Secondary | ICD-10-CM | POA: Diagnosis not present

## 2016-11-18 LAB — CBC WITH DIFFERENTIAL/PLATELET
BASOS PCT: 0 %
Basophils Absolute: 0 10*3/uL (ref 0.0–0.1)
Eosinophils Absolute: 0.1 10*3/uL (ref 0.0–0.7)
Eosinophils Relative: 1 %
HEMATOCRIT: 33.4 % — AB (ref 39.0–52.0)
HEMOGLOBIN: 11 g/dL — AB (ref 13.0–17.0)
LYMPHS ABS: 1.5 10*3/uL (ref 0.7–4.0)
Lymphocytes Relative: 19 %
MCH: 35.7 pg — AB (ref 26.0–34.0)
MCHC: 32.9 g/dL (ref 30.0–36.0)
MCV: 108.4 fL — ABNORMAL HIGH (ref 78.0–100.0)
MONOS PCT: 9 %
Monocytes Absolute: 0.7 10*3/uL (ref 0.1–1.0)
NEUTROS ABS: 5.5 10*3/uL (ref 1.7–7.7)
NEUTROS PCT: 71 %
Platelets: 220 10*3/uL (ref 150–400)
RBC: 3.08 MIL/uL — ABNORMAL LOW (ref 4.22–5.81)
RDW: 16.3 % — ABNORMAL HIGH (ref 11.5–15.5)
WBC: 7.7 10*3/uL (ref 4.0–10.5)

## 2016-11-18 LAB — COMPREHENSIVE METABOLIC PANEL
ALT: 30 U/L (ref 17–63)
ANION GAP: 12 (ref 5–15)
AST: 39 U/L (ref 15–41)
Albumin: 3.5 g/dL (ref 3.5–5.0)
Alkaline Phosphatase: 90 U/L (ref 38–126)
BUN: 9 mg/dL (ref 6–20)
CHLORIDE: 100 mmol/L — AB (ref 101–111)
CO2: 29 mmol/L (ref 22–32)
Calcium: 9.5 mg/dL (ref 8.9–10.3)
Creatinine, Ser: 1.03 mg/dL (ref 0.61–1.24)
GFR calc non Af Amer: >60 mL/min (ref >60)
Glucose, Bld: 133 mg/dL — ABNORMAL HIGH (ref 65–99)
Potassium: 3.4 mmol/L — ABNORMAL LOW (ref 3.5–5.1)
SODIUM: 141 mmol/L (ref 135–145)
Total Bilirubin: 0.6 mg/dL (ref 0.3–1.2)
Total Protein: 8 g/dL (ref 6.5–8.1)

## 2016-11-18 LAB — I-STAT CG4 LACTIC ACID, ED: LACTIC ACID, VENOUS: 1.24 mmol/L (ref 0.5–1.9)

## 2016-11-18 MED ORDER — IPRATROPIUM-ALBUTEROL 0.5-2.5 (3) MG/3ML IN SOLN
3.0000 mL | Freq: Once | RESPIRATORY_TRACT | Status: AC
  Administered 2016-11-18: 3 mL via RESPIRATORY_TRACT
  Filled 2016-11-18: qty 3

## 2016-11-18 MED ORDER — GI COCKTAIL ~~LOC~~
30.0000 mL | Freq: Once | ORAL | Status: AC
  Administered 2016-11-18: 30 mL via ORAL
  Filled 2016-11-18: qty 30

## 2016-11-18 MED ORDER — FAMOTIDINE 40 MG PO TABS: 40.0000 mg | ORAL_TABLET | Freq: Every day | ORAL | 0 refills | Status: DC

## 2016-11-18 MED ORDER — SODIUM CHLORIDE 0.9 % IV BOLUS (SEPSIS)
1000.0000 mL | Freq: Once | INTRAVENOUS | Status: AC
  Administered 2016-11-18: 1000 mL via INTRAVENOUS

## 2016-11-18 MED ORDER — MORPHINE SULFATE 15 MG PO TABS: 15.0000 mg | ORAL_TABLET | Freq: Four times a day (QID) | ORAL | 0 refills | Status: DC | PRN

## 2016-11-18 MED ORDER — POTASSIUM CHLORIDE CRYS ER 20 MEQ PO TBCR
40.0000 meq | EXTENDED_RELEASE_TABLET | Freq: Once | ORAL | Status: AC
  Administered 2016-11-18: 40 meq via ORAL
  Filled 2016-11-18: qty 2

## 2016-11-18 MED ORDER — MORPHINE SULFATE (PF) 4 MG/ML IV SOLN
4.0000 mg | Freq: Once | INTRAVENOUS | Status: AC
  Administered 2016-11-18: 4 mg via INTRAVENOUS
  Filled 2016-11-18: qty 1

## 2016-11-18 MED ORDER — IPRATROPIUM-ALBUTEROL 20-100 MCG/ACT IN AERS
1.0000 | INHALATION_SPRAY | Freq: Four times a day (QID) | RESPIRATORY_TRACT | 0 refills | Status: DC
Start: 2016-11-18 — End: 2016-11-19

## 2016-11-18 MED FILL — FAMOTIDINE 40 MG TABLET: 40 | 15 days supply | Qty: 15 | Fill #0

## 2016-11-18 MED FILL — MORPHINE SULFATE IR 15 MG T: 15 | 5 days supply | Qty: 20 | Fill #0

## 2016-11-18 MED FILL — COMBIVENT RESPIMAT INHAL SP: 20-100 | 30 days supply | Qty: 4 | Fill #0

## 2016-11-18 NOTE — ED Triage Notes (Signed)
Patient states that he thinks his cancer is "acting up". Started having worse pains in abd, legs, back, head.

## 2016-11-18 NOTE — Discharge Instructions (Signed)
Use Combivent inhaler as needed for shortness of breath Use Morphine as needed for pain. You can take Tylenol with this as well for extra pain relief. Please follow up with [name] for refills of this medicine Take Pepcid for stomach pain. Avoid spicy foods, acidic foods, NSAIDs (Ibuprofen, Aleve), alcohol Return for worsening symptoms

## 2016-11-18 NOTE — ED Provider Notes (Signed)
[facility] DEPT Provider Note   CSN: [id number] Arrival date & time: 11/18/16  0747     History   Chief Complaint Chief Complaint  Patient presents with  . Generalized Body Aches  . cancer patient    HPI [name] is a 56 y.o. male who presents with abdominal pain.  Past medical history significant for stage IV lung cancer currently on chemotherapy, COPD, spinal metastases.  Patient states that he has had abdominal pain intermittently for the past 6-7 months.  Last night he reports severe right sided abdominal pain.  He states that this pain is different for the pain he has had in the past.  Feels like a knot.  He has associated nausea without vomiting.  He reports his appetite is still good but it hurts when he eats.  No medicines tried prior to arrival.  Last night he could not get comfortable so decided to come to be checked in the ED today.  He also reports some worsening of his chronic shortness of breath.  Reports increased cough with blood-tinged sputum.  He has been using his Combivent inhaler with good relief.  Denies any fever, chills, chest pain, diarrhea, blood in the pool, urinary symptoms.  He also notes that he is homeless and is working on trying to find a place to live.  HPI  Past Medical History:  Diagnosis Date  . Abdominal pain 06/04/2016  . Adenocarcinoma of left lung, stage 4 ([place]) 05/02/2016  . Bronchitis due to tobacco use ([place])   . Cancer ([place])    Lung  . Dehydration 06/04/2016  . Encounter for antineoplastic chemotherapy 05/02/2016  . Goals of care, counseling/discussion 05/02/2016  . HTN (hypertension) 10/30/2016  . Recurrent upper respiratory infection (URI)   . Seizures ([place]) 05/2011   new onset  . Shortness of breath     Patient Active Problem List   Diagnosis Date Noted  . HTN (hypertension) 10/30/2016  . Chronic obstructive pulmonary disease ([place]) 09/02/2016  . COPD (chronic obstructive pulmonary disease) ([place])  08/19/2016  . Dehydration 06/04/2016  . Abdominal pain 06/04/2016  . 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 Adenocarcinoma of left lung, stage 4 ([place]) 05/02/2016  . Goals of care, counseling/discussion 05/02/2016  . Encounter for antineoplastic chemotherapy 05/02/2016  . Tobacco abuse 05/30/2011  . Alcohol abuse 05/30/2011  . Seizure ([place]) 05/28/2011    Past Surgical History:  Procedure Laterality Date  . IR FLUORO GUIDE PORT INSERTION RIGHT  05/13/2016  . IR US GUIDE VASC ACCESS RIGHT  05/13/2016  . NO PAST SURGERIES         Home Medications    Prior to Admission medications   Medication Sig Start Date End Date Taking? Authorizing Provider  amLODipine (NORVASC) 10 MG tablet Take 1 tablet (10 mg total) by mouth daily. 10/30/16  Yes Curt Bears, MD  dexamethasone (DECADRON) 4 MG tablet 4 mg by mouth twice a day the day before, day of and day after the chemotherapy every 3 weeks 09/25/16  Yes Curt Bears, MD  Diphenhydramine-PE-APAP [facility]) 12.5-5-325 MG/15ML LIQD Take 15 mLs by mouth every 6 (six) hours as needed (for cold).   Yes [provider]  Ipratropium-Albuterol (COMBIVENT) 20-100 MCG/ACT AERS respimat Inhale 1 puff into the lungs every 6 (six) hours. Patient taking differently: Inhale 1 puff into the lungs every 6 (six) hours as needed for wheezing or shortness of breath.  07/22/16  Yes Curt Bears, MD  lidocaine-prilocaine (EMLA) cream Apply  1 application topically as needed. 05/08/16  Yes Curt Bears, MD  sodium-potassium bicarbonate (ALKA-SELTZER GOLD) TBEF dissolvable tablet Take 1 tablet by mouth daily as needed (for cold).   Yes [provider]  folic acid (FOLVITE) 1 MG tablet Take 1 tablet (1 mg total) by mouth daily. Patient not taking: Reported on 11/04/2016 05/08/16   Curt Bears, MD  morphine (MSIR) 15 MG tablet Take 1 tablet (15 mg total) by mouth every 4 (four) hours as needed for severe pain. Patient not taking:  Reported on 11/18/2016 10/30/16   Curt Bears, MD  promethazine (PHENERGAN) 25 MG tablet Take 1 tablet (25 mg total) by mouth every 6 (six) hours as needed for nausea or vomiting. Patient not taking: Reported on 11/18/2016 11/04/16   Long, Wonda Olds, MD  umeclidinium-vilanterol [facility] ELLIPTA) 62.5-25 MCG/INH AEPB Inhale 1 puff into the lungs daily. Patient not taking: Reported on 11/04/2016 08/19/16   Rigoberto Noel, MD    Family History No family history on file.  Social History Social History  Substance Use Topics  . Smoking status: Current Some Day Smoker    Packs/day: 0.00    Years: 30.00    Types: Cigarettes  . Smokeless tobacco: Never Used  . Alcohol use No     Comment: occasional     Allergies   Patient has no known allergies.   Review of Systems Review of Systems  Constitutional: Negative for appetite change, chills and fever.  Respiratory: Positive for cough, shortness of breath and wheezing.   Cardiovascular: Negative for chest pain.  Gastrointestinal: Positive for abdominal pain and nausea. Negative for blood in stool, constipation, diarrhea and vomiting.  Genitourinary: Negative for dysuria.  Musculoskeletal: Positive for back pain (Chronic).  All other systems reviewed and are negative.    Physical Exam Updated Vital Signs BP (!) 160/101   Pulse (!) 102   Temp 98.1 F (36.7 C) (Axillary)   Resp (!) 23   SpO2 94%   Physical Exam  Constitutional: He is oriented to person, place, and time. He appears well-developed and well-nourished. No distress.  HENT:  Head: Normocephalic and atraumatic.  Eyes: Pupils are equal, round, and reactive to light. Conjunctivae are normal. Right eye exhibits no discharge. Left eye exhibits no discharge. No scleral icterus.  Neck: Normal range of motion.  Cardiovascular: Normal rate and regular rhythm.  Exam reveals no gallop and no friction rub.   No murmur heard. Pulmonary/Chest: Effort normal. No respiratory  distress. He has decreased breath sounds in the left upper field, the left middle field and the left lower field. He has no wheezes. He has no rhonchi. He has no rales.  Abdominal: Soft. Bowel sounds are normal. He exhibits no distension and no mass. There is tenderness (Diffuse upper abdominal tenderness with maximal area of tenderness over the epigastric area). There is guarding (Voluntary). There is no rebound. No hernia.  Musculoskeletal:  Diffuse spinal tenderness  Neurological: He is alert and oriented to person, place, and time.  Skin: Skin is warm and dry.  Psychiatric: He has a normal mood and affect. His behavior is normal.  Nursing note and vitals reviewed.    ED Treatments / Results  Labs (all labs ordered are listed, but only abnormal results are displayed) Labs Reviewed  COMPREHENSIVE METABOLIC PANEL - Abnormal; Notable for the following:       Result Value   Potassium 3.4 (*)    Chloride 100 (*)    Glucose, Bld 133 (*)  All other components within normal limits  CBC WITH DIFFERENTIAL/PLATELET - Abnormal; Notable for the following:    RBC 3.08 (*)    Hemoglobin 11.0 (*)    HCT 33.4 (*)    MCV 108.4 (*)    MCH 35.7 (*)    RDW 16.3 (*)    All other components within normal limits  URINALYSIS, ROUTINE W REFLEX MICROSCOPIC  I-STAT CG4 LACTIC ACID, ED    EKG  EKG Interpretation None       Radiology Dg Chest 2 View  Result Date: 11/18/2016 CLINICAL DATA:  Shortness of breath and cough. History of lung carcinoma EXAM: CHEST  2 VIEW COMPARISON:  Chest radiograph November 04, 2016; chest CT September 19, 2016 FINDINGS: There is again noted a mass in the left upper lobe measuring 3.5 x 2.7 cm. There is atelectatic change in the left mid lung. There is scarring in the left upper lobe as well as persistent left base fibrotic change. There is no new opacity compared to most recent chest radiograph. Trapped power port tip is in the superior vena cava. Heart size and  pulmonary vascular normal. No adenopathy is evident by radiography. There are no evident bone lesions. IMPRESSION: Persistent left upper lobe mass, currently measuring 3.5 x 2.7 cm. Areas of scarring in left upper lobe as well as left midlung atelectatic change. Fibrosis left base. Right lung clear. Stable cardiac silhouette. No adenopathy appreciable by radiography. Electronically Signed   By: Lowella Grip III M.D.   On: 11/18/2016 09:35    Procedures Procedures (including critical care time)  Medications Ordered in ED Medications  gi cocktail (Maalox,Lidocaine,Donnatal) (30 mLs Oral Given 11/18/16 0911)  sodium chloride 0.9 % bolus 1,000 mL (0 mLs Intravenous Stopped 11/18/16 1000)  morphine 4 MG/ML injection 4 mg (4 mg Intravenous Given 11/18/16 0912)  ipratropium-albuterol (DUONEB) 0.5-2.5 (3) MG/3ML nebulizer solution 3 mL (3 mLs Nebulization Given 11/18/16 0913)  potassium chloride SA (K-DUR,KLOR-CON) CR tablet 40 mEq (40 mEq Oral Given 11/18/16 1011)     Initial Impression / Assessment and Plan / ED Course  I have reviewed the triage vital signs and the nursing notes.  Pertinent labs & imaging results that were available during my care of the patient were reviewed by me and considered in my medical decision making (see chart for details).  56 year old male presents with upper abdominal pain which is worse after eating.  He is hypertensive but otherwise vital signs are normal.  He also has slight worsening of his chronic cough and shortness of breath.  He is almost out of his Combivent inhaler and has been out of his morphine tablets.  CBC is remarkable for anemia which is at baseline.  CMP is remarkable for mild hypokalemia and hyperglycemia.  Lactic acid is normal.  We will give IV fluids, GI cocktail, potassium, and a DuoNeb and reassess.   After medicines patient reports feeling better.  Will refill Combivent and small amount of morphine tablets.  We will also prescribe Pepcid  as needed for reflux/gastritis.  Advised follow-up with his doctor and return for worsening symptoms.  Final Clinical Impressions(s) / ED Diagnoses   Final diagnoses:  Acute gastritis without hemorrhage, unspecified gastritis type  SOB (shortness of breath) on exertion  Bone pain    New Prescriptions New Prescriptions   No medications on file     Recardo Evangelist, PA-C 11/18/16 1433    Drenda Freeze, MD 11/18/16 [phone]

## 2016-11-19 ENCOUNTER — Emergency Department (HOSPITAL_COMMUNITY)
Admission: EM | Admit: 2016-11-19 | Discharge: 2016-11-20 | Disposition: A | Discharge status: Home / Self Care | Payer: Medicaid Other | Attending: Emergency Medicine | Admitting: Emergency Medicine

## 2016-11-19 ENCOUNTER — Emergency Department (HOSPITAL_COMMUNITY): Payer: Medicaid Other

## 2016-11-19 ENCOUNTER — Encounter (HOSPITAL_COMMUNITY): Payer: Self-pay | Admitting: Radiology

## 2016-11-19 DIAGNOSIS — F17210 Nicotine dependence, cigarettes, uncomplicated: Secondary | ICD-10-CM | POA: Diagnosis not present

## 2016-11-19 DIAGNOSIS — I10 Essential (primary) hypertension: Secondary | ICD-10-CM | POA: Diagnosis not present

## 2016-11-19 DIAGNOSIS — R1013 Epigastric pain: Secondary | ICD-10-CM | POA: Diagnosis not present

## 2016-11-19 DIAGNOSIS — Z79899 Other long term (current) drug therapy: Secondary | ICD-10-CM | POA: Diagnosis not present

## 2016-11-19 DIAGNOSIS — J449 Chronic obstructive pulmonary disease, unspecified: Secondary | ICD-10-CM | POA: Diagnosis not present

## 2016-11-19 DIAGNOSIS — R1011 Right upper quadrant pain: Secondary | ICD-10-CM

## 2016-11-19 LAB — CBC
HCT: 30.8 % — ABNORMAL LOW (ref 39.0–52.0)
Hemoglobin: 10.2 g/dL — ABNORMAL LOW (ref 13.0–17.0)
MCH: 35.7 pg — ABNORMAL HIGH (ref 26.0–34.0)
MCHC: 33.1 g/dL (ref 30.0–36.0)
MCV: 107.7 fL — ABNORMAL HIGH (ref 78.0–100.0)
Platelets: 219 10*3/uL (ref 150–400)
RBC: 2.86 MIL/uL — ABNORMAL LOW (ref 4.22–5.81)
RDW: 16.2 % — ABNORMAL HIGH (ref 11.5–15.5)
WBC: 8.9 10*3/uL (ref 4.0–10.5)

## 2016-11-19 LAB — COMPREHENSIVE METABOLIC PANEL
ALT: 29 U/L (ref 17–63)
AST: 43 U/L — AB (ref 15–41)
Albumin: 3.7 g/dL (ref 3.5–5.0)
Alkaline Phosphatase: 89 U/L (ref 38–126)
Anion gap: 11 (ref 5–15)
BILIRUBIN TOTAL: 0.4 mg/dL (ref 0.3–1.2)
BUN: 8 mg/dL (ref 6–20)
CO2: 26 mmol/L (ref 22–32)
Calcium: 9.4 mg/dL (ref 8.9–10.3)
Chloride: 103 mmol/L (ref 101–111)
Creatinine, Ser: 1.05 mg/dL (ref 0.61–1.24)
GFR calc non Af Amer: >60 mL/min (ref >60)
Glucose, Bld: 100 mg/dL — ABNORMAL HIGH (ref 65–99)
POTASSIUM: 3.4 mmol/L — AB (ref 3.5–5.1)
Sodium: 140 mmol/L (ref 135–145)
TOTAL PROTEIN: 7.5 g/dL (ref 6.5–8.1)

## 2016-11-19 LAB — URINALYSIS, ROUTINE W REFLEX MICROSCOPIC
Bilirubin Urine: NEGATIVE
Glucose, UA: NEGATIVE mg/dL
Hgb urine dipstick: NEGATIVE
KETONES UR: NEGATIVE mg/dL
LEUKOCYTES UA: NEGATIVE
NITRITE: NEGATIVE
PROTEIN: NEGATIVE mg/dL
Specific Gravity, Urine: 1.005 (ref 1.005–1.030)
pH: 7 (ref 5.0–8.0)

## 2016-11-19 LAB — LIPASE, BLOOD: Lipase: 18 U/L (ref 11–51)

## 2016-11-19 MED ORDER — SODIUM CHLORIDE 0.9 % IV BOLUS (SEPSIS)
1000.0000 mL | Freq: Once | INTRAVENOUS | Status: AC
  Administered 2016-11-19: 1000 mL via INTRAVENOUS

## 2016-11-19 MED ORDER — ONDANSETRON 4 MG PO TBDP
4.0000 mg | ORAL_TABLET | Freq: Once | ORAL | Status: DC | PRN
  Filled 2016-11-19 (×2): qty 1

## 2016-11-19 MED ORDER — ONDANSETRON HCL 4 MG/2ML IJ SOLN
4.0000 mg | Freq: Once | INTRAMUSCULAR | Status: AC
  Administered 2016-11-19: 4 mg via INTRAVENOUS
  Filled 2016-11-19: qty 2

## 2016-11-19 MED ORDER — HYDROMORPHONE HCL 1 MG/ML IJ SOLN
1.0000 mg | Freq: Once | INTRAMUSCULAR | Status: AC
  Administered 2016-11-19: 1 mg via INTRAVENOUS
  Filled 2016-11-19: qty 1

## 2016-11-19 MED ORDER — IOPAMIDOL (ISOVUE-300) INJECTION 61%
INTRAVENOUS | Status: AC
  Administered 2016-11-19: 100 mL
  Filled 2016-11-19: qty 100

## 2016-11-19 MED ORDER — GI COCKTAIL ~~LOC~~
30.0000 mL | Freq: Once | ORAL | Status: AC
  Administered 2016-11-19: 30 mL via ORAL
  Filled 2016-11-19: qty 30

## 2016-11-19 NOTE — ED Notes (Signed)
Bed: WA20 Expected date:  Expected time:  Means of arrival:  Comments: abd pain triage

## 2016-11-19 NOTE — ED Notes (Signed)
ED Provider at bedside. 

## 2016-11-19 NOTE — ED Triage Notes (Signed)
Lower abd x 6 months  Seen at [facility] 1 day ago pt unable to provide Dx given. Emesis x1  Prior to EMS arrival. Hx stage 4 lung CA VS BP: 160/100 P: 86 R: 20 Sat 99%

## 2016-11-20 ENCOUNTER — Encounter: Payer: Self-pay | Admitting: *Deleted

## 2016-11-20 ENCOUNTER — Ambulatory Visit (HOSPITAL_BASED_OUTPATIENT_CLINIC_OR_DEPARTMENT_OTHER): Payer: Medicaid Other | Admitting: Internal Medicine

## 2016-11-20 ENCOUNTER — Encounter: Payer: Self-pay | Admitting: Gastroenterology

## 2016-11-20 ENCOUNTER — Ambulatory Visit (HOSPITAL_BASED_OUTPATIENT_CLINIC_OR_DEPARTMENT_OTHER): Payer: Medicaid Other

## 2016-11-20 ENCOUNTER — Other Ambulatory Visit (HOSPITAL_BASED_OUTPATIENT_CLINIC_OR_DEPARTMENT_OTHER): Payer: Medicaid Other

## 2016-11-20 ENCOUNTER — Encounter: Payer: Self-pay | Admitting: Internal Medicine

## 2016-11-20 ENCOUNTER — Telehealth: Payer: Self-pay

## 2016-11-20 VITALS — BP 158/75 | HR 94 | Temp 98.1°F | Resp 18 | Ht 71.0 in | Wt 134.6 lb

## 2016-11-20 DIAGNOSIS — Z5111 Encounter for antineoplastic chemotherapy: Secondary | ICD-10-CM

## 2016-11-20 DIAGNOSIS — R1013 Epigastric pain: Secondary | ICD-10-CM

## 2016-11-20 DIAGNOSIS — I10 Essential (primary) hypertension: Secondary | ICD-10-CM | POA: Diagnosis not present

## 2016-11-20 DIAGNOSIS — C7951 Secondary malignant neoplasm of bone: Secondary | ICD-10-CM | POA: Diagnosis not present

## 2016-11-20 DIAGNOSIS — C3492 Malignant neoplasm of unspecified part of left bronchus or lung: Secondary | ICD-10-CM | POA: Diagnosis not present

## 2016-11-20 DIAGNOSIS — J449 Chronic obstructive pulmonary disease, unspecified: Secondary | ICD-10-CM

## 2016-11-20 DIAGNOSIS — R5383 Other fatigue: Secondary | ICD-10-CM | POA: Diagnosis not present

## 2016-11-20 LAB — COMPREHENSIVE METABOLIC PANEL
ALBUMIN: 3.5 g/dL (ref 3.5–5.0)
ALK PHOS: 100 U/L (ref 40–150)
ALT: 26 U/L (ref 0–55)
AST: 36 U/L — AB (ref 5–34)
Anion Gap: 10 mEq/L (ref 3–11)
BUN: 6.7 mg/dL — AB (ref 7.0–26.0)
CALCIUM: 10 mg/dL (ref 8.4–10.4)
CO2: 28 mEq/L (ref 22–29)
CREATININE: 1 mg/dL (ref 0.7–1.3)
Chloride: 104 mEq/L (ref 98–109)
EGFR: >60 mL/min/{1.73_m2} (ref >60)
GLUCOSE: 81 mg/dL (ref 70–140)
POTASSIUM: 3.6 meq/L (ref 3.5–5.1)
Sodium: 142 mEq/L (ref 136–145)
TOTAL PROTEIN: 8 g/dL (ref 6.4–8.3)
Total Bilirubin: 0.36 mg/dL (ref 0.20–1.20)

## 2016-11-20 LAB — CBC WITH DIFFERENTIAL/PLATELET
BASO%: 0.4 % (ref 0.0–2.0)
Basophils Absolute: 0 10*3/uL (ref 0.0–0.1)
EOS%: 0.7 % (ref 0.0–7.0)
Eosinophils Absolute: 0 10*3/uL (ref 0.0–0.5)
HEMATOCRIT: 34.4 % — AB (ref 38.4–49.9)
HEMOGLOBIN: 11.3 g/dL — AB (ref 13.0–17.1)
LYMPH#: 0.3 10*3/uL — AB (ref 0.9–3.3)
LYMPH%: 4 % — ABNORMAL LOW (ref 14.0–49.0)
MCH: 36.1 pg — ABNORMAL HIGH (ref 27.2–33.4)
MCHC: 32.9 g/dL (ref 32.0–36.0)
MCV: 109.7 fL — ABNORMAL HIGH (ref 79.3–98.0)
MONO#: 1.6 10*3/uL — AB (ref 0.1–0.9)
MONO%: 24.6 % — ABNORMAL HIGH (ref 0.0–14.0)
NEUT%: 70.3 % (ref 39.0–75.0)
NEUTROS ABS: 4.5 10*3/uL (ref 1.5–6.5)
PLATELETS: 224 10*3/uL (ref 140–400)
RBC: 3.14 10*6/uL — ABNORMAL LOW (ref 4.20–5.82)
RDW: 17 % — AB (ref 11.0–14.6)
WBC: 6.4 10*3/uL (ref 4.0–10.3)

## 2016-11-20 LAB — CBC
HCT: 30.6 % — ABNORMAL LOW (ref 39.0–52.0)
Hemoglobin: 10.2 g/dL — ABNORMAL LOW (ref 13.0–17.0)
MCH: 36.2 pg — ABNORMAL HIGH (ref 26.0–34.0)
MCHC: 33.3 g/dL (ref 30.0–36.0)
MCV: 108.5 fL — AB (ref 78.0–100.0)
PLATELETS: 206 10*3/uL (ref 150–400)
RBC: 2.82 MIL/uL — AB (ref 4.22–5.81)
RDW: 16.1 % — ABNORMAL HIGH (ref 11.5–15.5)
WBC: 7 10*3/uL (ref 4.0–10.5)

## 2016-11-20 LAB — UA PROTEIN, DIPSTICK - CHCC

## 2016-11-20 MED ORDER — PANTOPRAZOLE SODIUM 40 MG IV SOLR
40.0000 mg | Freq: Once | INTRAVENOUS | Status: AC
  Administered 2016-11-20: 40 mg via INTRAVENOUS
  Filled 2016-11-20: qty 40

## 2016-11-20 MED ORDER — OXYCODONE-ACETAMINOPHEN 5-325 MG PO TABS
1.0000 | ORAL_TABLET | Freq: Once | ORAL | Status: AC
  Administered 2016-11-20: 1 via ORAL
  Filled 2016-11-20: qty 1

## 2016-11-20 MED ORDER — PROCHLORPERAZINE MALEATE 10 MG PO TABS
ORAL_TABLET | ORAL | Status: AC
  Filled 2016-11-20: qty 1

## 2016-11-20 MED ORDER — OMEPRAZOLE 20 MG PO CPDR: 20.0000 mg | DELAYED_RELEASE_CAPSULE | Freq: Every day | ORAL | 0 refills | Status: DC

## 2016-11-20 MED ORDER — CYANOCOBALAMIN 1000 MCG/ML IJ SOLN
INTRAMUSCULAR | Status: AC
  Filled 2016-11-20: qty 1

## 2016-11-20 MED ORDER — FAMOTIDINE IN NACL 20-0.9 MG/50ML-% IV SOLN
20.0000 mg | Freq: Once | INTRAVENOUS | Status: AC
  Administered 2016-11-20: 20 mg via INTRAVENOUS
  Filled 2016-11-20: qty 50

## 2016-11-20 MED ORDER — SODIUM CHLORIDE 0.9 % IV SOLN
520.0000 mg/m2 | Freq: Once | INTRAVENOUS | Status: AC
  Administered 2016-11-20: 900 mg via INTRAVENOUS
  Filled 2016-11-20: qty 20

## 2016-11-20 MED ORDER — SODIUM CHLORIDE 0.9 % IV SOLN
Freq: Once | INTRAVENOUS | Status: AC
  Administered 2016-11-20: 13:00:00 via INTRAVENOUS

## 2016-11-20 MED ORDER — HEPARIN SOD (PORK) LOCK FLUSH 100 UNIT/ML IV SOLN
500.0000 [IU] | Freq: Once | INTRAVENOUS | Status: AC | PRN
Start: 2016-11-20 — End: 2016-11-20
  Administered 2016-11-20: 500 [IU]
  Filled 2016-11-20: qty 5

## 2016-11-20 MED ORDER — PROCHLORPERAZINE MALEATE 10 MG PO TABS
10.0000 mg | ORAL_TABLET | Freq: Once | ORAL | Status: AC
  Administered 2016-11-20: 10 mg via ORAL

## 2016-11-20 MED ORDER — SODIUM CHLORIDE 0.9% FLUSH
10.0000 mL | INTRAVENOUS | Status: DC | PRN
  Administered 2016-11-20: 10 mL
  Filled 2016-11-20: qty 10

## 2016-11-20 MED ORDER — CYANOCOBALAMIN 1000 MCG/ML IJ SOLN
1000.0000 ug | Freq: Once | INTRAMUSCULAR | Status: AC
  Administered 2016-11-20: 1000 ug via INTRAMUSCULAR

## 2016-11-20 MED ORDER — ONDANSETRON 4 MG PO TBDP: 4.0000 mg | ORAL_TABLET | Freq: Three times a day (TID) | ORAL | 0 refills | Status: DC | PRN

## 2016-11-20 MED FILL — ONDANSETRON ODT 4 MG TABLET: 4 | 6 days supply | Qty: 20 | Fill #0

## 2016-11-20 MED FILL — DEXAMETHASONE 4 MG TABLET: 4 | 30 days supply | Qty: 40 | Fill #1

## 2016-11-20 NOTE — ED Provider Notes (Signed)
[facility] DEPT Provider Note   CSN: [id number] Arrival date & time: 11/19/16  2006     History   Chief Complaint Chief Complaint  Patient presents with  . Abdominal Pain    HPI [name] is a 56 y.o. male.  HPI   56 yo M with PMHx of HTN, adenoCA of lung here with abdominal pain. Pt reports 6-7 months of worsening epigastric abd pain. He describes it as aching, throbbing, severe pain that is worse with eating and palpation. He was seen yesterday, had normal labs and was sent home. He has been seen by his Oncologist for this as well. No fevers. No diarrhea. He felt better on discharge yesterday, drank coffee and had return of sx. Pain worsens with eating and palpation. No alleviating factors.  Past Medical History:  Diagnosis Date  . Abdominal pain 06/04/2016  . Adenocarcinoma of left lung, stage 4 ([place]) 05/02/2016  . Bronchitis due to tobacco use ([place])   . Cancer ([place])    Lung  . Dehydration 06/04/2016  . Encounter for antineoplastic chemotherapy 05/02/2016  . Goals of care, counseling/discussion 05/02/2016  . HTN (hypertension) 10/30/2016  . Recurrent upper respiratory infection (URI)   . Seizures ([place]) 05/2011   new onset  . Shortness of breath     Patient Active Problem List   Diagnosis Date Noted  . HTN (hypertension) 10/30/2016  . Chronic obstructive pulmonary disease ([place]) 09/02/2016  . COPD (chronic obstructive pulmonary disease) ([place]) 08/19/2016  . Dehydration 06/04/2016  . Abdominal pain 06/04/2016  . 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 Adenocarcinoma of left lung, stage 4 ([place]) 05/02/2016  . Goals of care, counseling/discussion 05/02/2016  . Encounter for antineoplastic chemotherapy 05/02/2016  . Tobacco abuse 05/30/2011  . Alcohol abuse 05/30/2011  . Seizure ([place]) 05/28/2011    Past Surgical History:  Procedure Laterality Date  . IR FLUORO GUIDE PORT INSERTION RIGHT  05/13/2016  . IR US GUIDE VASC ACCESS RIGHT   05/13/2016  . NO PAST SURGERIES         Home Medications    Prior to Admission medications   Medication Sig Start Date End Date Taking? Authorizing Provider  amLODipine (NORVASC) 10 MG tablet Take 1 tablet (10 mg total) by mouth daily. 10/30/16  Yes Curt Bears, MD  dexamethasone (DECADRON) 4 MG tablet 4 mg by mouth twice a day the day before, day of and day after the chemotherapy every 3 weeks 09/25/16  Yes Curt Bears, MD  famotidine (PEPCID) 40 MG tablet Take 1 tablet (40 mg total) by mouth daily. 11/18/16  Yes Recardo Evangelist, PA-C  Ipratropium-Albuterol (COMBIVENT) 20-100 MCG/ACT AERS respimat Inhale 1 puff into the lungs every 6 (six) hours. Patient taking differently: Inhale 1 puff into the lungs every 6 (six) hours as needed for wheezing or shortness of breath.  07/22/16  Yes Curt Bears, MD  lidocaine-prilocaine (EMLA) cream Apply 1 application topically as needed. Patient taking differently: Apply 1 application topically as needed (port).  05/08/16  Yes Curt Bears, MD  morphine (MSIR) 15 MG tablet Take 1 tablet (15 mg total) by mouth every 6 (six) hours as needed for severe pain. 11/18/16  Yes Recardo Evangelist, PA-C    Family History No family history on file.  Social History Social History  Substance Use Topics  . Smoking status: Current Some Day Smoker    Packs/day: 0.00    Years: 30.00    Types: Cigarettes  . Smokeless tobacco: Never  Used  . Alcohol use No     Comment: occasional     Allergies   Patient has no known allergies.   Review of Systems Review of Systems  Constitutional: Positive for fatigue.  Gastrointestinal: Positive for abdominal pain, nausea and vomiting.  All other systems reviewed and are negative.    Physical Exam Updated Vital Signs BP 125/86   Pulse 76   Temp 98.1 F (36.7 C) (Oral)   Resp 20   SpO2 90%   Physical Exam  Constitutional: He is oriented to person, place, and time. He appears well-developed  and well-nourished. No distress.  HENT:  Head: Normocephalic and atraumatic.  Eyes: Conjunctivae are normal.  Neck: Neck supple.  Cardiovascular: Normal rate, regular rhythm and normal heart sounds.  Exam reveals no friction rub.   No murmur heard. Pulmonary/Chest: Effort normal and breath sounds normal. No respiratory distress. He has no wheezes. He has no rales.  Abdominal: Soft. He exhibits no distension. There is tenderness in the right upper quadrant and epigastric area. There is guarding.  Musculoskeletal: He exhibits no edema.  Neurological: He is alert and oriented to person, place, and time. He exhibits normal muscle tone.  Skin: Skin is warm. Capillary refill takes less than 2 seconds.  Psychiatric: He has a normal mood and affect.  Nursing note and vitals reviewed.    ED Treatments / Results  Labs (all labs ordered are listed, but only abnormal results are displayed) Labs Reviewed  COMPREHENSIVE METABOLIC PANEL - Abnormal; Notable for the following:       Result Value   Potassium 3.4 (*)    Glucose, Bld 100 (*)    AST 43 (*)    All other components within normal limits  CBC - Abnormal; Notable for the following:    RBC 2.86 (*)    Hemoglobin 10.2 (*)    HCT 30.8 (*)    MCV 107.7 (*)    MCH 35.7 (*)    RDW 16.2 (*)    All other components within normal limits  URINALYSIS, ROUTINE W REFLEX MICROSCOPIC - Abnormal; Notable for the following:    Color, Urine STRAW (*)    All other components within normal limits  CBC - Abnormal; Notable for the following:    RBC 2.82 (*)    Hemoglobin 10.2 (*)    HCT 30.6 (*)    MCV 108.5 (*)    MCH 36.2 (*)    RDW 16.1 (*)    All other components within normal limits  LIPASE, BLOOD    EKG  EKG Interpretation  Date/Time:  Wednesday November 20 2016 00:11:34 EDT Ventricular Rate:  74 PR Interval:    QRS Duration: 94 QT Interval:  421 QTC Calculation: 468 R Axis:   99 Text Interpretation:  Sinus rhythm Consider left  atrial enlargement Anterolateral infarct, old No significant change since last tracing Confirmed by Duffy Bruce [phone]) on 11/20/2016 2:12:28 AM       Radiology Dg Chest 2 View  Result Date: 11/18/2016 CLINICAL DATA:  Shortness of breath and cough. History of lung carcinoma EXAM: CHEST  2 VIEW COMPARISON:  Chest radiograph November 04, 2016; chest CT September 19, 2016 FINDINGS: There is again noted a mass in the left upper lobe measuring 3.5 x 2.7 cm. There is atelectatic change in the left mid lung. There is scarring in the left upper lobe as well as persistent left base fibrotic change. There is no new opacity compared to most recent chest  radiograph. Trapped power port tip is in the superior vena cava. Heart size and pulmonary vascular normal. No adenopathy is evident by radiography. There are no evident bone lesions. IMPRESSION: Persistent left upper lobe mass, currently measuring 3.5 x 2.7 cm. Areas of scarring in left upper lobe as well as left midlung atelectatic change. Fibrosis left base. Right lung clear. Stable cardiac silhouette. No adenopathy appreciable by radiography. Electronically Signed   By: Lowella Grip III M.D.   On: 11/18/2016 09:35   Ct Abdomen Pelvis W Contrast  Result Date: 11/19/2016 CLINICAL DATA:  Abdominal pain, nausea and vomiting EXAM: CT ABDOMEN AND PELVIS WITH CONTRAST TECHNIQUE: Multidetector CT imaging of the abdomen and pelvis was performed using the standard protocol following bolus administration of intravenous contrast. CONTRAST:  148mL ISOVUE-300 IOPAMIDOL (ISOVUE-300) INJECTION 61% COMPARISON:  CT abdomen pelvis 11/04/2016 FINDINGS: Lower chest: Unchanged appearance of left lower lobe adenocarcinoma. No pleural effusion. Hepatobiliary: Normal hepatic contours and density. Possible geographic fatty infiltration along the course of the falciform ligament, or transient hepatic attenuation difference. No visible biliary dilatation. Normal gallbladder.  Pancreas: Normal contours without ductal dilatation. No peripancreatic fluid collection. Spleen: Normal. Adrenals/Urinary Tract: --Adrenal glands: Normal. --Right kidney/ureter: No hydronephrosis or perinephric stranding. No nephrolithiasis. No obstructing ureteral stones. --Left kidney/ureter: No hydronephrosis or perinephric stranding. No nephrolithiasis. No obstructing ureteral stones. --Urinary bladder: Unremarkable. Stomach/Bowel: --Stomach/Duodenum: No hiatal hernia or other gastric abnormality. Normal duodenal course and caliber. --Small bowel: No dilatation or inflammation. --Colon: No focal abnormality. --Appendix: Normal. Vascular/Lymphatic: Atherosclerotic calcification is present within the non-aneurysmal abdominal aorta, without hemodynamically significant stenosis. No abdominal or pelvic lymphadenopathy. Reproductive: No free fluid in the pelvis. Musculoskeletal. Sclerotic lesion at the inferior endplate of L2 is unchanged. Well-circumscribed lytic lesion at the inferior endplate of L3 is also unchanged. Generalized sclerosis at T12 is also unchanged. Other: None. IMPRESSION: 1. No acute abdominal or pelvic abnormality. 2. Unchanged appearance of left lower lobe adenocarcinoma with lepidic features. 3. Unchanged L3 lytic lesion and T12 and L2 sclerotic lesions. 4.  Aortic Atherosclerosis (ICD10-I70.0). Electronically Signed   By: Ulyses Jarred M.D.   On: 11/19/2016 22:49   US Abdomen Limited Ruq  Result Date: 11/20/2016 CLINICAL DATA:  Right upper quadrant pain. EXAM: ULTRASOUND ABDOMEN LIMITED RIGHT UPPER QUADRANT COMPARISON:  Abdominal CT 2 hours prior. FINDINGS: Gallbladder: Bowel gas partially obscures evaluation. Physiologically distended. No gallstones or wall thickening visualized. No sonographic Murphy sign noted by sonographer. Common bile duct: Diameter: 3 mm. Liver: Mild heterogeneous parenchymal echogenicity. No evidence of focal lesion. Portal vein is patent on color Doppler imaging  with normal direction of blood flow towards the liver. IMPRESSION: No explanation for right upper quadrant pain. Normal sonographic appearance of the gallbladder. Electronically Signed   By: Jeb Levering M.D.   On: 11/20/2016 01:29    Procedures Procedures (including critical care time)  Medications Ordered in ED Medications  ondansetron (ZOFRAN-ODT) disintegrating tablet 4 mg (not administered)  sodium chloride 0.9 % bolus 1,000 mL (0 mLs Intravenous Stopped 11/20/16 0035)  HYDROmorphone (DILAUDID) injection 1 mg (1 mg Intravenous Given 11/19/16 2202)  ondansetron (ZOFRAN) injection 4 mg (4 mg Intravenous Given 11/19/16 2159)  gi cocktail (Maalox,Lidocaine,Donnatal) (30 mLs Oral Given 11/19/16 2159)  iopamidol (ISOVUE-300) 61 % injection (100 mLs  Contrast Given 11/19/16 2221)  pantoprazole (PROTONIX) injection 40 mg (40 mg Intravenous Given 11/20/16 0036)  famotidine (PEPCID) IVPB 20 mg premix (0 mg Intravenous Stopped 11/20/16 0100)     Initial Impression / Assessment and  Plan / ED Course  I have reviewed the triage vital signs and the nursing notes.  Pertinent labs & imaging results that were available during my care of the patient were reviewed by me and considered in my medical decision making (see chart for details).     56 year old male with history of adenocarcinoma here with ongoing epigastric and right upper quadrant pain.  Suspect gastritis versus peptic ulcer disease versus biliary colic.  CT scan negative.  Patient has ongoing epigastric pain.  He did report some blood-tinged emesis while in the ER.  Will repeat a CBC to make sure patient has no signs of active bleeding, obtain right upper quadrant ultrasound.  If symptoms are improved and his vitals are stable, suspect this can be referred to GI as an outpatient.  If he has ongoing bleeding, worsening pain, plan to reassess. He is o/w afebrile and well appearing. VSS. EKG non-ischemic.  Final Clinical Impressions(s) /  ED Diagnoses   Final diagnoses:  RUQ pain  Epigastric pain    New Prescriptions New Prescriptions   No medications on file     Duffy Bruce, MD 11/20/16 [phone]

## 2016-11-20 NOTE — Discharge Instructions (Signed)
You are seen today for abdominal pain.  Your imaging and workup is reassuring.  This may be related to a gastritis.  Take Zofran as needed for vomiting and omeprazole daily.  If you have any new or worsening symptoms you should be reevaluated.

## 2016-11-20 NOTE — Telephone Encounter (Signed)
Spoke with pt on the phone. Appt for labs at 0830. Pt not yet arrived. Pt told me that he had an ED visit last night and was on the bus on the way to the [facility] [place]. Message placed on Diane, RN's desk and spoke with Tim, Agricultural consultant in the event that pt will need to be placed in a different chair in the infusion room.

## 2016-11-20 NOTE — ED Provider Notes (Signed)
Patient signed out pending ultrasound imaging and repeat CBC.  Repeat CBC with stable hemoglobin.  Ultrasound is negative for acute abnormality.  On recheck, patient is still endorsing pain.  However, he is able to tolerate fluids without vomiting.  Suspect gastritis.  Will discharge with Zofran and omeprazole.  Patient was given strict return precautions.   Merryl Hacker, MD 11/20/16 [phone]

## 2016-11-20 NOTE — Progress Notes (Signed)
[place] [phone]   [phone]) [phone]  OFFICE PROGRESS NOTE  Patient, No Pcp Per No address on file  DIAGNOSIS: Stage IV (T3, N3, M1c) non-small cell lung cancer, moderately to poorly differentiated adenocarcinoma diagnosed in February 2018 with negative EGFR, ALK, ROS1 and BRAF mutations, with PD L1 expression 15-20 %.  PRIOR THERAPY:  1) Palliative radiotherapy to the T12 and L3 completed on 05/27/2016 under the care of Dr. Tammi Klippel. 2) Systemic chemotherapy with carboplatin for AUC of 5, Alimta 500 MG/M2 and Avastin 15 MG/KG every 3 weeks. Status post 6 cycles with partial response.  CURRENT THERAPY: Maintenance treatment with single agent Alimta 500 MG/M2 every 3 weeks. First dose 10/09/2016.status post 2 cycle.  INTERVAL HISTORY: [name] 56 y.o. male returns to the clinic today for follow-up visit.  The patient is feeling fine today with no specific complaints except for fatigue and persistent epigastric pain.  He was seen at the emergency department twice recently and had imaging studies including CT scan of the abdomen and pelvis as well as ultrasound of the abdomen that were unremarkable for any abnormalities.  He was treated for gastritis in the emergency department.  He was supposed to have repeat CT scan of the chest but this was not performed.  The patient denied having any current chest pain, shortness of breath, cough or hemoptysis.  He denied having any fever or chills.  He has no nausea, vomiting, diarrhea or constipation.  He is here today for evaluation before starting cycle #3.   MEDICAL HISTORY: Past Medical History:  Diagnosis Date  . Abdominal pain 06/04/2016  . Adenocarcinoma of left lung, stage 4 ([place]) 05/02/2016  . Bronchitis due to tobacco use ([place])   . Cancer ([place])    Lung  . Dehydration 06/04/2016  . Encounter for antineoplastic chemotherapy 05/02/2016  . Goals of care, counseling/discussion 05/02/2016  . HTN  (hypertension) 10/30/2016  . Recurrent upper respiratory infection (URI)   . Seizures ([place]) 05/2011   new onset  . Shortness of breath     ALLERGIES:  has No Known Allergies.  MEDICATIONS:  Current Outpatient Prescriptions  Medication Sig Dispense Refill  . amLODipine (NORVASC) 10 MG tablet Take 1 tablet (10 mg total) by mouth daily. 30 tablet 2  . dexamethasone (DECADRON) 4 MG tablet 4 mg by mouth twice a day the day before, day of and day after the chemotherapy every 3 weeks 40 tablet 1  . famotidine (PEPCID) 40 MG tablet Take 1 tablet (40 mg total) by mouth daily. 15 tablet 0  . Ipratropium-Albuterol (COMBIVENT) 20-100 MCG/ACT AERS respimat Inhale 1 puff into the lungs every 6 (six) hours. (Patient taking differently: Inhale 1 puff into the lungs every 6 (six) hours as needed for wheezing or shortness of breath. ) 1 Inhaler 0  . lidocaine-prilocaine (EMLA) cream Apply 1 application topically as needed. (Patient taking differently: Apply 1 application topically as needed (port). ) 30 g 0  . morphine (MSIR) 15 MG tablet Take 1 tablet (15 mg total) by mouth every 6 (six) hours as needed for severe pain. 20 tablet 0  . omeprazole (PRILOSEC) 20 MG capsule Take 1 capsule (20 mg total) by mouth daily. 30 capsule 0  . ondansetron (ZOFRAN ODT) 4 MG disintegrating tablet Take 1 tablet (4 mg total) by mouth every 8 (eight) hours as needed for nausea or vomiting. 20 tablet 0   No current facility-administered medications for this visit.  SURGICAL HISTORY:  Past Surgical History:  Procedure Laterality Date  . IR FLUORO GUIDE PORT INSERTION RIGHT  05/13/2016  . IR US GUIDE VASC ACCESS RIGHT  05/13/2016  . NO PAST SURGERIES      REVIEW OF SYSTEMS:  Constitutional: positive for fatigue Eyes: negative Ears, nose, mouth, throat, and face: negative Respiratory: negative Cardiovascular: negative Gastrointestinal: positive for abdominal pain and  dyspepsia Genitourinary:negative Integument/breast: negative Hematologic/lymphatic: negative Musculoskeletal:positive for stiff joints Neurological: negative Behavioral/Psych: negative Endocrine: negative Allergic/Immunologic: negative   PHYSICAL EXAMINATION: General appearance: alert, cooperative, fatigued and no distress Head: Normocephalic, without obvious abnormality, atraumatic Neck: no adenopathy, no JVD, supple, symmetrical, trachea midline and thyroid not enlarged, symmetric, no tenderness/mass/nodules Lymph nodes: Cervical, supraclavicular, and axillary nodes normal. Resp: clear to auscultation bilaterally Back: symmetric, no curvature. ROM normal. No CVA tenderness. Cardio: regular rate and rhythm, S1, S2 normal, no murmur, click, rub or gallop GI: soft, non-tender; bowel sounds normal; no masses,  no organomegaly Extremities: extremities normal, atraumatic, no cyanosis or edema Neurologic: Alert and oriented X 3, normal strength and tone. Normal symmetric reflexes. Normal coordination and gait  ECOG PERFORMANCE STATUS: 1 - Symptomatic but completely ambulatory  Blood pressure (!) 158/75, pulse 94, temperature 98.1 F (36.7 C), temperature source Oral, resp. rate 18, height 5' 11"  (1.803 m), weight 134 lb 9.6 oz (61.1 kg), SpO2 95 %.  LABORATORY DATA: Lab Results  Component Value Date   WBC 6.4 11/20/2016   HGB 11.3 (L) 11/20/2016   HCT 34.4 (L) 11/20/2016   MCV 109.7 (H) 11/20/2016   PLT 224 11/20/2016      Chemistry      Component Value Date/Time   NA 142 11/20/2016 1057   K 3.6 11/20/2016 1057   CL 103 11/19/2016 2125   CO2 28 11/20/2016 1057   BUN 6.7 (L) 11/20/2016 1057   CREATININE 1.0 11/20/2016 1057      Component Value Date/Time   CALCIUM 10.0 11/20/2016 1057   ALKPHOS 100 11/20/2016 1057   AST 36 (H) 11/20/2016 1057   ALT 26 11/20/2016 1057   BILITOT 0.36 11/20/2016 1057       RADIOGRAPHIC STUDIES: Dg Chest 2 View  Result Date:  11/18/2016 CLINICAL DATA:  Shortness of breath and cough. History of lung carcinoma EXAM: CHEST  2 VIEW COMPARISON:  Chest radiograph November 04, 2016; chest CT September 19, 2016 FINDINGS: There is again noted a mass in the left upper lobe measuring 3.5 x 2.7 cm. There is atelectatic change in the left mid lung. There is scarring in the left upper lobe as well as persistent left base fibrotic change. There is no new opacity compared to most recent chest radiograph. Trapped power port tip is in the superior vena cava. Heart size and pulmonary vascular normal. No adenopathy is evident by radiography. There are no evident bone lesions. IMPRESSION: Persistent left upper lobe mass, currently measuring 3.5 x 2.7 cm. Areas of scarring in left upper lobe as well as left midlung atelectatic change. Fibrosis left base. Right lung clear. Stable cardiac silhouette. No adenopathy appreciable by radiography. Electronically Signed   By: Lowella Grip III M.D.   On: 11/18/2016 09:35   Dg Chest 2 View  Result Date: 11/04/2016 CLINICAL DATA:  Worsening cough and shortness of breath. History of lung cancer. EXAM: CHEST  2 VIEW COMPARISON:  Chest x-ray dated August 19, 2016. FINDINGS: Right chest wall port in unchanged position with the tip at the cavoatrial junction. The cardiomediastinal silhouette is normal  in size. Normal pulmonary vascularity. The lungs remain hyperinflated. Left suprahilar mass appears largely unchanged. Increased interstitial markings in the left lower lobe are not significantly changed. No consolidation, pleural effusion, or pneumothorax. No acute osseous abnormality. IMPRESSION: 1. Largely unchanged left suprahilar mass and interstitial disease at the left lung base. No acute cardiopulmonary process. Electronically Signed   By: Titus Dubin M.D.   On: 11/04/2016 11:08   Ct Abdomen Pelvis W Contrast  Result Date: 11/19/2016 CLINICAL DATA:  Abdominal pain, nausea and vomiting EXAM: CT ABDOMEN AND  PELVIS WITH CONTRAST TECHNIQUE: Multidetector CT imaging of the abdomen and pelvis was performed using the standard protocol following bolus administration of intravenous contrast. CONTRAST:  167m ISOVUE-300 IOPAMIDOL (ISOVUE-300) INJECTION 61% COMPARISON:  CT abdomen pelvis 11/04/2016 FINDINGS: Lower chest: Unchanged appearance of left lower lobe adenocarcinoma. No pleural effusion. Hepatobiliary: Normal hepatic contours and density. Possible geographic fatty infiltration along the course of the falciform ligament, or transient hepatic attenuation difference. No visible biliary dilatation. Normal gallbladder. Pancreas: Normal contours without ductal dilatation. No peripancreatic fluid collection. Spleen: Normal. Adrenals/Urinary Tract: --Adrenal glands: Normal. --Right kidney/ureter: No hydronephrosis or perinephric stranding. No nephrolithiasis. No obstructing ureteral stones. --Left kidney/ureter: No hydronephrosis or perinephric stranding. No nephrolithiasis. No obstructing ureteral stones. --Urinary bladder: Unremarkable. Stomach/Bowel: --Stomach/Duodenum: No hiatal hernia or other gastric abnormality. Normal duodenal course and caliber. --Small bowel: No dilatation or inflammation. --Colon: No focal abnormality. --Appendix: Normal. Vascular/Lymphatic: Atherosclerotic calcification is present within the non-aneurysmal abdominal aorta, without hemodynamically significant stenosis. No abdominal or pelvic lymphadenopathy. Reproductive: No free fluid in the pelvis. Musculoskeletal. Sclerotic lesion at the inferior endplate of L2 is unchanged. Well-circumscribed lytic lesion at the inferior endplate of L3 is also unchanged. Generalized sclerosis at T12 is also unchanged. Other: None. IMPRESSION: 1. No acute abdominal or pelvic abnormality. 2. Unchanged appearance of left lower lobe adenocarcinoma with lepidic features. 3. Unchanged L3 lytic lesion and T12 and L2 sclerotic lesions. 4.  Aortic Atherosclerosis  (ICD10-I70.0). Electronically Signed   By: KUlyses JarredM.D.   On: 11/19/2016 22:49   Ct Abdomen Pelvis W Contrast  Result Date: 11/04/2016 CLINICAL DATA:  Generalized abdominal and back pain with nausea and vomiting. History of stage IV lung cancer, currently on chemotherapy. EXAM: CT ABDOMEN AND PELVIS WITH CONTRAST TECHNIQUE: Multidetector CT imaging of the abdomen and pelvis was performed using the standard protocol following bolus administration of intravenous contrast. CONTRAST:  < 100 cc > ISOVUE-300 IOPAMIDOL (ISOVUE-300) INJECTION 61% COMPARISON:  CT chest, abdomen, and pelvis dated September 19, 2016. FINDINGS: Lower chest: Nodular interlobular septal thickening in the left lower lobe, consistent with lymphangitic carcinomatosis, largely unchanged. Hepatobiliary: No focal liver abnormality is seen. No gallstones, gallbladder wall thickening, or biliary dilatation. Pancreas: Unremarkable. No pancreatic ductal dilatation or surrounding inflammatory changes. Spleen: Normal in size without focal abnormality. Adrenals/Urinary Tract: Adrenal glands are unremarkable. Kidneys are normal, without renal calculi, focal lesion, or hydronephrosis. Symmetric mild proximal urothelial enhancement. Bladder is unremarkable. Stomach/Bowel: Stomach is within normal limits. Appendix appears normal. No evidence of bowel wall thickening, distention, or inflammatory changes. Vascular/Lymphatic: Aortic atherosclerosis. No enlarged abdominal or pelvic lymph nodes. Reproductive: Prostate is unremarkable. Other: No free fluid or pneumoperitoneum. Musculoskeletal: Unchanged diffuse sclerosis of the T12 vertebral body. Unchanged 1.0 cm sclerotic lesion in the L2 vertebral body. Unchanged 1.8 cm lytic lesion in the L3 vertebral body, likely a large Schmorl's node. No new osseous lesions. No acute fracture. IMPRESSION: 1. No acute intra-abdominal process. 2. Unchanged left lower lobe  lymphangitic carcinomatosis. 3. Unchanged diffuse  sclerosis of the T12 vertebral body, and 1 cm sclerotic lesion in the L2 vertebral body, suspicious for osseous metastases. 4.  Aortic atherosclerosis (ICD10-I70.0). Electronically Signed   By: Titus Dubin M.D.   On: 11/04/2016 12:25   US Abdomen Limited Ruq  Result Date: 11/20/2016 CLINICAL DATA:  Right upper quadrant pain. EXAM: ULTRASOUND ABDOMEN LIMITED RIGHT UPPER QUADRANT COMPARISON:  Abdominal CT 2 hours prior. FINDINGS: Gallbladder: Bowel gas partially obscures evaluation. Physiologically distended. No gallstones or wall thickening visualized. No sonographic Murphy sign noted by sonographer. Common bile duct: Diameter: 3 mm. Liver: Mild heterogeneous parenchymal echogenicity. No evidence of focal lesion. Portal vein is patent on color Doppler imaging with normal direction of blood flow towards the liver. IMPRESSION: No explanation for right upper quadrant pain. Normal sonographic appearance of the gallbladder. Electronically Signed   By: Jeb Levering M.D.   On: 11/20/2016 01:29    ASSESSMENT AND PLAN:  This is a very pleasant 56 years old African-American male with a stage IV non-small cell lung cancer, adenocarcinoma with no actionable mutations. He underwent systemic chemotherapy with carboplatin, Alimta and Avastin status post 6 cycles and has been tolerating the treatment fairly well. He is currently undergoing maintenance treatment with Alimta 500 MG/M2 every 3 weeks, status post 2 cycles The patient continues to tolerate this treatment fairly well with no significant adverse effects.  I recommended for him to proceed with cycle #3 today as a schedule. I will arrange for the patient to have repeat CT scan of the chest in 1 week for restaging of his disease. For the persistent abdominal pain questionable gastritis, I would refer the patient to gastroenterology for evaluation and consideration of upper endoscopy. For hypertension, the patient will continue with his current blood  pressure medication. He was advised to call immediately if he has any concerning symptoms in the interval. The patient voices understanding of current disease status and treatment options and is in agreement with the current care plan. All questions were answered. The patient knows to call the clinic with any problems, questions or concerns. We can certainly see the patient much sooner if necessary.  Disclaimer: This note was dictated with voice recognition software. Similar sounding words can inadvertently be transcribed and may not be corrected upon review.

## 2016-11-20 NOTE — Patient Instructions (Signed)
[facility] Discharge Instructions for Patients Receiving Chemotherapy  Today you received the following chemotherapy agents Alimta  To help prevent nausea and vomiting after your treatment, we encourage you to take your nausea medication as prescribed.   If you develop nausea and vomiting that is not controlled by your nausea medication, call the clinic.   BELOW ARE SYMPTOMS THAT SHOULD BE REPORTED IMMEDIATELY:  *FEVER GREATER THAN 100.5 F  *CHILLS WITH OR WITHOUT FEVER  NAUSEA AND VOMITING THAT IS NOT CONTROLLED WITH YOUR NAUSEA MEDICATION  *UNUSUAL SHORTNESS OF BREATH  *UNUSUAL BRUISING OR BLEEDING  TENDERNESS IN MOUTH AND THROAT WITH OR WITHOUT PRESENCE OF ULCERS  *URINARY PROBLEMS  *BOWEL PROBLEMS  UNUSUAL RASH Items with * indicate a potential emergency and should be followed up as soon as possible.  Feel free to call the clinic should you have any questions or concerns. The clinic phone number is [phone].  Please show the [facility] at check-in to the Emergency Department and triage nurse.

## 2016-11-22 ENCOUNTER — Encounter (HOSPITAL_COMMUNITY): Payer: Self-pay | Admitting: Emergency Medicine

## 2016-11-22 ENCOUNTER — Emergency Department (HOSPITAL_COMMUNITY)
Admission: EM | Admit: 2016-11-22 | Discharge: 2016-11-22 | Disposition: A | Discharge status: Home / Self Care | Payer: Medicaid Other | Attending: Emergency Medicine | Admitting: Emergency Medicine

## 2016-11-22 DIAGNOSIS — I10 Essential (primary) hypertension: Secondary | ICD-10-CM | POA: Insufficient documentation

## 2016-11-22 DIAGNOSIS — G894 Chronic pain syndrome: Secondary | ICD-10-CM

## 2016-11-22 DIAGNOSIS — F17210 Nicotine dependence, cigarettes, uncomplicated: Secondary | ICD-10-CM | POA: Insufficient documentation

## 2016-11-22 DIAGNOSIS — J449 Chronic obstructive pulmonary disease, unspecified: Secondary | ICD-10-CM | POA: Diagnosis not present

## 2016-11-22 DIAGNOSIS — M791 Myalgia, unspecified site: Secondary | ICD-10-CM | POA: Diagnosis present

## 2016-11-22 MED ORDER — MORPHINE SULFATE 30 MG PO TABS: 15.0000 mg | ORAL_TABLET | Freq: Four times a day (QID) | ORAL | 0 refills | Status: DC | PRN

## 2016-11-22 MED FILL — MORPHINE SULFATE IR 30 MG T: 30 | 5 days supply | Qty: 10 | Fill #0

## 2016-11-22 NOTE — ED Provider Notes (Addendum)
[facility] DEPT Provider Note   CSN: [id number] Arrival date & time: 11/22/16  0720     History   Chief Complaint Chief Complaint  Patient presents with  . body pains    HPI [name] is a 56 y.o. male.  Planes of diffuse body aches onset 4 months ago body aches to include chest abdomen arms legs and had.  Pain is unchanged.  He treats himself with morphine sulfate 15 mg once or twice per day.  He was seen here on 11/20/2016 where he had CT scan of abdomen and pelvis as well as abdominal ultrasound showing no acute disease, stable lytic lesions of his thoracic spine and adenocarcinoma of the lung.  He vomited one time 3 days ago with slight amount of blood.  He was last prescribed morphine sulfate IR 15 mg 4 times daily 20 tablets on 11/18/2016.  Pain is constant and unchanging.  No nausea present.  He is presently hungry.  Pain is worse with changing positions and improved with remaining still.  No other associated symptoms no fever. HPI  Past Medical History:  Diagnosis Date  . Abdominal pain 06/04/2016  . Adenocarcinoma of left lung, stage 4 ([place]) 05/02/2016  . Bronchitis due to tobacco use ([place])   . Cancer ([place])    Lung  . Dehydration 06/04/2016  . Encounter for antineoplastic chemotherapy 05/02/2016  . Goals of care, counseling/discussion 05/02/2016  . HTN (hypertension) 10/30/2016  . Recurrent upper respiratory infection (URI)   . Seizures ([place]) 05/2011   new onset  . Shortness of breath     Patient Active Problem List   Diagnosis Date Noted  . HTN (hypertension) 10/30/2016  . Chronic obstructive pulmonary disease ([place]) 09/02/2016  . COPD (chronic obstructive pulmonary disease) ([place]) 08/19/2016  . Dehydration 06/04/2016  . Abdominal pain 06/04/2016  . 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 Adenocarcinoma of left lung, stage 4 ([place]) 05/02/2016  . Goals of care, counseling/discussion 05/02/2016  . Encounter for antineoplastic chemotherapy  05/02/2016  . Tobacco abuse 05/30/2011  . Alcohol abuse 05/30/2011  . Seizure ([place]) 05/28/2011    Past Surgical History:  Procedure Laterality Date  . IR FLUORO GUIDE PORT INSERTION RIGHT  05/13/2016  . IR US GUIDE VASC ACCESS RIGHT  05/13/2016  . NO PAST SURGERIES         Home Medications    Prior to Admission medications   Medication Sig Start Date End Date Taking? Authorizing Provider  amLODipine (NORVASC) 10 MG tablet Take 1 tablet (10 mg total) by mouth daily. 10/30/16   Curt Bears, MD  dexamethasone (DECADRON) 4 MG tablet 4 mg by mouth twice a day the day before, day of and day after the chemotherapy every 3 weeks 09/25/16   Curt Bears, MD  famotidine (PEPCID) 40 MG tablet Take 1 tablet (40 mg total) by mouth daily. 11/18/16   Recardo Evangelist, PA-C  Ipratropium-Albuterol (COMBIVENT) 20-100 MCG/ACT AERS respimat Inhale 1 puff into the lungs every 6 (six) hours. Patient taking differently: Inhale 1 puff into the lungs every 6 (six) hours as needed for wheezing or shortness of breath.  07/22/16   Curt Bears, MD  lidocaine-prilocaine (EMLA) cream Apply 1 application topically as needed. Patient taking differently: Apply 1 application topically as needed (port).  05/08/16   Curt Bears, MD  morphine (MSIR) 15 MG tablet Take 1 tablet (15 mg total) by mouth every 6 (six) hours as needed for severe pain. 11/18/16   Ruthann Cancer,  Jesse Fall, PA-C  omeprazole (PRILOSEC) 20 MG capsule Take 1 capsule (20 mg total) by mouth daily. 11/20/16   Horton, Barbette Hair, MD  ondansetron (ZOFRAN ODT) 4 MG disintegrating tablet Take 1 tablet (4 mg total) by mouth every 8 (eight) hours as needed for nausea or vomiting. 11/20/16   Horton, Barbette Hair, MD    Family History No family history on file.  Social History Social History  Substance Use Topics  . Smoking status: Current Some Day Smoker    Packs/day: 0.00    Years: 30.00    Types: Cigarettes  . Smokeless tobacco: Never Used  .  Alcohol use No     Comment: occasional   No illicit drug use  Allergies   Patient has no known allergies.   Review of Systems Review of Systems  Cardiovascular: Positive for chest pain.  Gastrointestinal: Positive for abdominal pain.  Musculoskeletal: Positive for myalgias.  Allergic/Immunologic: Positive for immunocompromised state.  Neurological: Positive for headaches.  All other systems reviewed and are negative.    Physical Exam Updated Vital Signs BP 138/90 (BP Location: Left Arm)   Pulse (!) 115   Temp 98.6 F (37 C) (Oral)   Resp (!) 22   SpO2 94%   Physical Exam  Constitutional: He appears well-developed and well-nourished.  HENT:  Head: Normocephalic and atraumatic.  Eyes: Pupils are equal, round, and reactive to light. Conjunctivae are normal.  Neck: Neck supple. No tracheal deviation present. No thyromegaly present.  Cardiovascular: Regular rhythm.   No murmur heard. Pulse counted at 100 bpm by me  Pulmonary/Chest: Effort normal and breath sounds normal.  Abdominal: Soft. Bowel sounds are normal. He exhibits no distension.  Mild diffuse tenderness.  Genitourinary: Penis normal.  Genitourinary Comments: Normal male genitalia.  Musculoskeletal: Normal range of motion. He exhibits no edema or tenderness.  4 extremities without redness swelling or tenderness neurovascularly intact.  Entire spine is nontender.  Neurological: He is alert. Coordination normal.  Skin: Skin is warm and dry. No rash noted.  Psychiatric: He has a normal mood and affect.  Nursing note and vitals reviewed.    ED Treatments / Results  Labs (all labs ordered are listed, but only abnormal results are displayed) Labs Reviewed - No data to display  EKG  EKG Interpretation None       Radiology No results found.  Procedures Procedures (including critical care time)  Medications Ordered in ED Medications - No data to display   Initial Impression / Assessment and Plan /  ED Course  I have reviewed the triage vital signs and the nursing notes.  Pertinent labs & imaging results that were available during my care of the patient were reviewed by me and considered in my medical decision making (see chart for details).     Patient's pain is chronic.  He had an extensive workup less than 1 week ago.  I spoke with Dr.Mohammad,, his oncologist.  Who is well familiar with patient he agrees that no further workup is needed.  Plan I will prescribe MSIR 15 mg every 6 hours, 5-day supply patient is to call Dr.Mohammad November 25, 2016 for follow-up Mild tachycardia and tachypnea likely secondary to pain. Final Clinical Impressions(s) / ED Diagnoses  Diagnosis #1chronic pain #2 medication refill Final diagnoses:  None    New Prescriptions New Prescriptions   No medications on file     Orlie Dakin, MD 11/22/16 [place], MD 11/22/16 [phone]

## 2016-11-22 NOTE — ED Triage Notes (Signed)
Patient c/o generalized body pains that have been ongoing. Patient reports that he had Chemo on Wed for stage 4 lung cancer.  Reports he was in ED on Monday and Tuesday for same symptoms.

## 2016-11-22 NOTE — Discharge Instructions (Signed)
Call Dr.Mohamed Monday, November 25, 2016 for follow-up regarding today's visit.  He is expecting your call

## 2016-11-27 ENCOUNTER — Ambulatory Visit (HOSPITAL_COMMUNITY)
Admission: RE | Admit: 2016-11-27 | Discharge: 2016-11-27 | Disposition: A | Discharge status: Home / Self Care | Payer: Medicaid Other | Source: Ambulatory Visit | Attending: Internal Medicine | Admitting: Internal Medicine

## 2016-11-27 DIAGNOSIS — C7951 Secondary malignant neoplasm of bone: Secondary | ICD-10-CM | POA: Diagnosis not present

## 2016-11-27 DIAGNOSIS — Z5111 Encounter for antineoplastic chemotherapy: Secondary | ICD-10-CM

## 2016-11-27 DIAGNOSIS — C3492 Malignant neoplasm of unspecified part of left bronchus or lung: Secondary | ICD-10-CM | POA: Insufficient documentation

## 2016-11-27 DIAGNOSIS — J449 Chronic obstructive pulmonary disease, unspecified: Secondary | ICD-10-CM | POA: Diagnosis present

## 2016-11-27 MED ORDER — IOPAMIDOL (ISOVUE-300) INJECTION 61%
INTRAVENOUS | Status: AC
  Administered 2016-11-27: 75 mL
  Filled 2016-11-27: qty 75

## 2016-11-29 ENCOUNTER — Ambulatory Visit: Payer: Medicaid Other | Admitting: Gastroenterology

## 2016-12-03 ENCOUNTER — Encounter: Payer: Self-pay | Admitting: Physician Assistant

## 2016-12-04 ENCOUNTER — Telehealth: Payer: Self-pay | Admitting: Medical Oncology

## 2016-12-04 ENCOUNTER — Other Ambulatory Visit: Payer: Self-pay | Admitting: Medical Oncology

## 2016-12-04 DIAGNOSIS — C7951 Secondary malignant neoplasm of bone: Secondary | ICD-10-CM

## 2016-12-04 DIAGNOSIS — Z5111 Encounter for antineoplastic chemotherapy: Secondary | ICD-10-CM

## 2016-12-04 DIAGNOSIS — C3492 Malignant neoplasm of unspecified part of left bronchus or lung: Secondary | ICD-10-CM

## 2016-12-04 DIAGNOSIS — R1013 Epigastric pain: Secondary | ICD-10-CM

## 2016-12-04 MED ORDER — MORPHINE SULFATE 15 MG PO TABS: 15.0000 mg | ORAL_TABLET | ORAL | 0 refills | Status: DC | PRN

## 2016-12-04 MED FILL — MORPHINE SULFATE IR 15 MG T: 15 | 10 days supply | Qty: 60 | Fill #0

## 2016-12-04 NOTE — Telephone Encounter (Signed)
Requests refill . Called pt to pick up rx.

## 2016-12-11 ENCOUNTER — Ambulatory Visit (HOSPITAL_BASED_OUTPATIENT_CLINIC_OR_DEPARTMENT_OTHER): Payer: Medicaid Other | Admitting: Oncology

## 2016-12-11 ENCOUNTER — Encounter: Payer: Self-pay | Admitting: Oncology

## 2016-12-11 ENCOUNTER — Other Ambulatory Visit (HOSPITAL_BASED_OUTPATIENT_CLINIC_OR_DEPARTMENT_OTHER): Payer: Medicaid Other

## 2016-12-11 ENCOUNTER — Ambulatory Visit (HOSPITAL_BASED_OUTPATIENT_CLINIC_OR_DEPARTMENT_OTHER): Payer: Medicaid Other

## 2016-12-11 VITALS — BP 152/87 | HR 87 | Temp 98.1°F | Resp 18 | Ht 71.0 in | Wt 141.8 lb

## 2016-12-11 DIAGNOSIS — R1013 Epigastric pain: Secondary | ICD-10-CM

## 2016-12-11 DIAGNOSIS — Z5111 Encounter for antineoplastic chemotherapy: Secondary | ICD-10-CM | POA: Diagnosis present

## 2016-12-11 DIAGNOSIS — C3492 Malignant neoplasm of unspecified part of left bronchus or lung: Secondary | ICD-10-CM

## 2016-12-11 DIAGNOSIS — D649 Anemia, unspecified: Secondary | ICD-10-CM

## 2016-12-11 DIAGNOSIS — I10 Essential (primary) hypertension: Secondary | ICD-10-CM

## 2016-12-11 DIAGNOSIS — R5383 Other fatigue: Secondary | ICD-10-CM

## 2016-12-11 DIAGNOSIS — C7951 Secondary malignant neoplasm of bone: Secondary | ICD-10-CM

## 2016-12-11 LAB — COMPREHENSIVE METABOLIC PANEL
ALBUMIN: 3.1 g/dL — AB (ref 3.5–5.0)
ALK PHOS: 104 U/L (ref 40–150)
ALT: 24 U/L (ref 0–55)
ANION GAP: 8 meq/L (ref 3–11)
AST: 33 U/L (ref 5–34)
BILIRUBIN TOTAL: 0.29 mg/dL (ref 0.20–1.20)
BUN: 10.5 mg/dL (ref 7.0–26.0)
CALCIUM: 9.6 mg/dL (ref 8.4–10.4)
CO2: 30 mEq/L — ABNORMAL HIGH (ref 22–29)
CREATININE: 1.2 mg/dL (ref 0.7–1.3)
Chloride: 104 mEq/L (ref 98–109)
EGFR: >60 mL/min/{1.73_m2} (ref >60)
Glucose: 80 mg/dl (ref 70–140)
Potassium: 3.9 mEq/L (ref 3.5–5.1)
Sodium: 142 mEq/L (ref 136–145)
Total Protein: 7.4 g/dL (ref 6.4–8.3)

## 2016-12-11 LAB — CBC WITH DIFFERENTIAL/PLATELET
BASO%: 0.2 % (ref 0.0–2.0)
Basophils Absolute: 0 10*3/uL (ref 0.0–0.1)
EOS ABS: 0.1 10*3/uL (ref 0.0–0.5)
EOS%: 1.1 % (ref 0.0–7.0)
HCT: 28.2 % — ABNORMAL LOW (ref 38.4–49.9)
HGB: 9.1 g/dL — ABNORMAL LOW (ref 13.0–17.1)
MCH: 35.8 pg — ABNORMAL HIGH (ref 27.2–33.4)
MCHC: 32.3 g/dL (ref 32.0–36.0)
MCV: 111 fL — AB (ref 79.3–98.0)
MONO#: 1.3 10*3/uL — ABNORMAL HIGH (ref 0.1–0.9)
MONO%: 22.6 % — AB (ref 0.0–14.0)
NEUT%: 66 % (ref 39.0–75.0)
NEUTROS ABS: 3.7 10*3/uL (ref 1.5–6.5)
Platelets: 279 10*3/uL (ref 140–400)
RBC: 2.54 10*6/uL — AB (ref 4.20–5.82)
RDW: 16 % — AB (ref 11.0–14.6)
WBC: 5.5 10*3/uL (ref 4.0–10.3)
lymph#: 0.6 10*3/uL — ABNORMAL LOW (ref 0.9–3.3)

## 2016-12-11 LAB — TECHNOLOGIST REVIEW

## 2016-12-11 LAB — UA PROTEIN, DIPSTICK - CHCC: PROTEIN: NEGATIVE mg/dL

## 2016-12-11 MED ORDER — SODIUM CHLORIDE 0.9 % IV SOLN
510.0000 mg/m2 | Freq: Once | INTRAVENOUS | Status: AC
  Administered 2016-12-11: 900 mg via INTRAVENOUS
  Filled 2016-12-11: qty 20

## 2016-12-11 MED ORDER — SODIUM CHLORIDE 0.9% FLUSH
10.0000 mL | INTRAVENOUS | Status: DC | PRN
  Administered 2016-12-11: 10 mL
  Filled 2016-12-11: qty 10

## 2016-12-11 MED ORDER — PROCHLORPERAZINE MALEATE 10 MG PO TABS
10.0000 mg | ORAL_TABLET | Freq: Once | ORAL | Status: AC
  Administered 2016-12-11: 10 mg via ORAL

## 2016-12-11 MED ORDER — SODIUM CHLORIDE 0.9 % IV SOLN
Freq: Once | INTRAVENOUS | Status: AC
  Administered 2016-12-11: 12:00:00 via INTRAVENOUS

## 2016-12-11 MED ORDER — PROCHLORPERAZINE MALEATE 10 MG PO TABS
ORAL_TABLET | ORAL | Status: AC
  Filled 2016-12-11: qty 1

## 2016-12-11 MED ORDER — HEPARIN SOD (PORK) LOCK FLUSH 100 UNIT/ML IV SOLN
500.0000 [IU] | Freq: Once | INTRAVENOUS | Status: AC | PRN
  Administered 2016-12-11: 500 [IU]
  Filled 2016-12-11: qty 5

## 2016-12-11 NOTE — Patient Instructions (Signed)
[facility] Discharge Instructions for Patients Receiving Chemotherapy  Today you received the following chemotherapy agents Alimpta  To help prevent nausea and vomiting after your treatment, we encourage you to take your nausea medication as directed.   If you develop nausea and vomiting that is not controlled by your nausea medication, call the clinic.   BELOW ARE SYMPTOMS THAT SHOULD BE REPORTED IMMEDIATELY:  *FEVER GREATER THAN 100.5 F  *CHILLS WITH OR WITHOUT FEVER  NAUSEA AND VOMITING THAT IS NOT CONTROLLED WITH YOUR NAUSEA MEDICATION  *UNUSUAL SHORTNESS OF BREATH  *UNUSUAL BRUISING OR BLEEDING  TENDERNESS IN MOUTH AND THROAT WITH OR WITHOUT PRESENCE OF ULCERS  *URINARY PROBLEMS  *BOWEL PROBLEMS  UNUSUAL RASH Items with * indicate a potential emergency and should be followed up as soon as possible.  Feel free to call the clinic should you have any questions or concerns. The clinic phone number is [phone].  Please show the [facility] at check-in to the Emergency Department and triage nurse.

## 2016-12-11 NOTE — Assessment & Plan Note (Signed)
This is a very pleasant 56 year old African-American male with a stage IV non-small cell lung cancer, adenocarcinoma with no actionable mutations. He underwent systemic chemotherapy with carboplatin, Alimta and Avastin status post 6 cycles and has been tolerating the treatment fairly well. He is currently undergoing maintenance treatment with Alimta 500 MG/M2 every 3 weeks, status post 3 cycles The patient continues to tolerate this treatment fairly well with no significant adverse effects.    The patient was seen with [name].  Restaging CT scan of the chest was reviewed with the patient.  Explained to the patient that he has no evidence of disease progression.  Recommend for him to continue his current treatment with maintenance Alimta.  He will proceed with cycle #4 today as a scheduled.  For the persistent abdominal pain questionable gastritis, he will keep his appointment as scheduled next week with gastroenterology for consideration of upper endoscopy. For hypertension, the patient will continue with his current blood pressure medication.  He was advised to call immediately if he has any concerning symptoms in the interval. The patient voices understanding of current disease status and treatment options and is in agreement with the current care plan. All questions were answered. The patient knows to call the clinic with any problems, questions or concerns. We can certainly see the patient much sooner if necessary.

## 2016-12-11 NOTE — Progress Notes (Signed)
[facility] OFFICE PROGRESS NOTE  Patient, No Pcp Per No address on file  DIAGNOSIS: Stage IV (T3, N3, M1c) non-small cell lung cancer, moderately to poorly differentiated adenocarcinoma diagnosed in February 2018 with negative EGFR, ALK, ROS1and BRAF mutations, with PDL1 expression 15-20 %.  PRIOR THERAPY: 1) Palliative radiotherapy to the T12 and L3 completed on 05/27/2016 under the care of Dr. Tammi Klippel. 2) Systemic chemotherapy with carboplatin for AUC of 5, Alimta 500 MG/M2 and Avastin 15 MG/KG every 3 weeks. Status post 6 cycles with partial response.  CURRENT THERAPY: Maintenance treatment with single agent Alimta 500 MG/M2 every 3 weeks. First dose 10/09/2016.status post 3 cycles.  INTERVAL HISTORY: [name] 56 y.o. male returns for routine follow-up visit by himself.  The patient is feeling fine today with exception of ongoing fatigue and persistent epigastric pain.  The patient has a visit with gastroenterology next week.  The patient denies fevers and chills.  Denies chest pain, shortness of breath at rest, cough, hemoptysis.  He does have shortness of breath with exertion.  Denies nausea, vomiting, constipation, diarrhea.  Denies bleeding.  Patient is here for evaluation prior to cycle 4 of his treatment.  MEDICAL HISTORY: Past Medical History:  Diagnosis Date  . Abdominal pain 06/04/2016  . Adenocarcinoma of left lung, stage 4 ([place]) 05/02/2016  . Bronchitis due to tobacco use ([place])   . Cancer ([place])    Lung  . Dehydration 06/04/2016  . Encounter for antineoplastic chemotherapy 05/02/2016  . Goals of care, counseling/discussion 05/02/2016  . HTN (hypertension) 10/30/2016  . Recurrent upper respiratory infection (URI)   . Seizures ([place]) 05/2011   new onset  . Shortness of breath     ALLERGIES:  has No Known Allergies.  MEDICATIONS:  Current Outpatient Medications  Medication Sig Dispense Refill  . amLODipine (NORVASC) 10 MG tablet Take 1 tablet (10 mg  total) by mouth daily. 30 tablet 2  . dexamethasone (DECADRON) 4 MG tablet 4 mg by mouth twice a day the day before, day of and day after the chemotherapy every 3 weeks 40 tablet 1  . famotidine (PEPCID) 40 MG tablet Take 1 tablet (40 mg total) by mouth daily. 15 tablet 0  . Ipratropium-Albuterol (COMBIVENT) 20-100 MCG/ACT AERS respimat Inhale 1 puff into the lungs every 6 (six) hours. (Patient taking differently: Inhale 1 puff into the lungs every 6 (six) hours as needed for wheezing or shortness of breath. ) 1 Inhaler 0  . lidocaine-prilocaine (EMLA) cream Apply 1 application topically as needed. (Patient taking differently: Apply 1 application topically as needed (port). ) 30 g 0  . morphine (MSIR) 15 MG tablet Take 1 tablet (15 mg total) every 4 (four) hours as needed by mouth for severe pain. 60 tablet 0  . omeprazole (PRILOSEC) 20 MG capsule Take 1 capsule (20 mg total) by mouth daily. 30 capsule 0  . ondansetron (ZOFRAN ODT) 4 MG disintegrating tablet Take 1 tablet (4 mg total) by mouth every 8 (eight) hours as needed for nausea or vomiting. 20 tablet 0  . promethazine (PHENERGAN) 25 MG tablet Take 25 mg by mouth every 6 (six) hours as needed for nausea/vomiting.  0   No current facility-administered medications for this visit.    Facility-Administered Medications Ordered in Other Visits  Medication Dose Route Frequency Provider Last Rate Last Dose  . 0.9 %  sodium chloride infusion   Intravenous Once Curt Bears, MD      . heparin lock flush 100  unit/mL  500 Units Intracatheter Once PRN Curt Bears, MD      . PEMEtrexed (ALIMTA) 900 mg in sodium chloride 0.9 % 100 mL chemo infusion  510 mg/m2 (Treatment Plan Recorded) Intravenous Once Curt Bears, MD      . sodium chloride flush (NS) 0.9 % injection 10 mL  10 mL Intracatheter PRN Curt Bears, MD        SURGICAL HISTORY:  Past Surgical History:  Procedure Laterality Date  . IR FLUORO GUIDE PORT INSERTION RIGHT   05/13/2016  . IR US GUIDE VASC ACCESS RIGHT  05/13/2016  . NO PAST SURGERIES      REVIEW OF SYSTEMS:   Review of Systems  Constitutional: Negative for appetite change, chills, fatigue, fever and unexpected weight change.  HENT:   Negative for mouth sores, nosebleeds, sore throat and trouble swallowing.   Eyes: Negative for eye problems and icterus.  Respiratory: Negative for cough, hemoptysis, shortness of breath and wheezing.   Cardiovascular: Negative for chest pain and leg swelling.  Gastrointestinal: Negative for abdominal pain, constipation, diarrhea, nausea and vomiting.  Genitourinary: Negative for bladder incontinence, difficulty urinating, dysuria, frequency and hematuria.   Musculoskeletal: Negative for back pain, gait problem, neck pain and neck stiffness.  Skin: Negative for itching and rash.  Neurological: Negative for dizziness, extremity weakness, gait problem, headaches, light-headedness and seizures.  Hematological: Negative for adenopathy. Does not bruise/bleed easily.  Psychiatric/Behavioral: Negative for confusion, depression and sleep disturbance. The patient is not nervous/anxious.     PHYSICAL EXAMINATION:  Blood pressure (!) 152/87, pulse 87, temperature 98.1 F (36.7 C), temperature source Oral, resp. rate 18, height 5' 11" (1.803 m), weight 141 lb 12.8 oz (64.3 kg), SpO2 91 %.  ECOG PERFORMANCE STATUS: 1 - Symptomatic but completely ambulatory  Physical Exam  Constitutional: Oriented to person, place, and time and well-developed, well-nourished, and in no distress. No distress.  HENT:  Head: Normocephalic and atraumatic.  Mouth/Throat: Oropharynx is clear and moist. No oropharyngeal exudate.  Eyes: Conjunctivae are normal. Right eye exhibits no discharge. Left eye exhibits no discharge. No scleral icterus.  Neck: Normal range of motion. Neck supple.  Cardiovascular: Normal rate, regular rhythm, normal heart sounds and intact distal pulses.   Pulmonary/Chest:  Effort normal and breath sounds normal. No respiratory distress. No wheezes. No rales.  Abdominal: Soft. Bowel sounds are normal. Exhibits no distension and no mass. There is no tenderness.  Musculoskeletal: Normal range of motion. Exhibits no edema.  Lymphadenopathy:    No cervical adenopathy.  Neurological: Alert and oriented to person, place, and time. Exhibits normal muscle tone. Gait normal. Coordination normal.  Skin: Skin is warm and dry. No rash noted. Not diaphoretic. No erythema. No pallor.  Psychiatric: Mood, memory and judgment normal.  Vitals reviewed.  LABORATORY DATA: Lab Results  Component Value Date   WBC 5.5 12/11/2016   HGB 9.1 (L) 12/11/2016   HCT 28.2 (L) 12/11/2016   MCV 111.0 (H) 12/11/2016   PLT 279 12/11/2016      Chemistry      Component Value Date/Time   NA 142 12/11/2016 0942   K 3.9 12/11/2016 0942   CL 103 11/19/2016 2125   CO2 30 (H) 12/11/2016 0942   BUN 10.5 12/11/2016 0942   CREATININE 1.2 12/11/2016 0942      Component Value Date/Time   CALCIUM 9.6 12/11/2016 0942   ALKPHOS 104 12/11/2016 0942   AST 33 12/11/2016 0942   ALT 24 12/11/2016 0942   BILITOT 0.29  12/11/2016 2774       RADIOGRAPHIC STUDIES:  Dg Chest 2 View  Result Date: 11/18/2016 CLINICAL DATA:  Shortness of breath and cough. History of lung carcinoma EXAM: CHEST  2 VIEW COMPARISON:  Chest radiograph November 04, 2016; chest CT September 19, 2016 FINDINGS: There is again noted a mass in the left upper lobe measuring 3.5 x 2.7 cm. There is atelectatic change in the left mid lung. There is scarring in the left upper lobe as well as persistent left base fibrotic change. There is no new opacity compared to most recent chest radiograph. Trapped power port tip is in the superior vena cava. Heart size and pulmonary vascular normal. No adenopathy is evident by radiography. There are no evident bone lesions. IMPRESSION: Persistent left upper lobe mass, currently measuring 3.5 x 2.7 cm.  Areas of scarring in left upper lobe as well as left midlung atelectatic change. Fibrosis left base. Right lung clear. Stable cardiac silhouette. No adenopathy appreciable by radiography. Electronically Signed   By: Lowella Grip III M.D.   On: 11/18/2016 09:35   Ct Chest W Contrast  Result Date: 11/27/2016 CLINICAL DATA:  Left lung cancer, chemotherapy. EXAM: CT CHEST WITH CONTRAST TECHNIQUE: Multidetector CT imaging of the chest was performed during intravenous contrast administration. CONTRAST:  85m ISOVUE-300 IOPAMIDOL (ISOVUE-300) INJECTION 61% COMPARISON:  09/19/2016. FINDINGS: Cardiovascular: Right IJ Port-A-Cath terminates in the right atrium. Vascular structures are unremarkable. Heart size normal. No pericardial effusion. Mediastinum/Nodes: There is haziness in the mediastinal fat, without discrete adenopathy. Right hilar lymph node measures 9 mm, similar. No left hilar or axillary adenopathy. Esophagus is grossly unremarkable. Lungs/Pleura: Bullous emphysema. Mild peribronchial thickening. Residual irregular confluent soft tissue in the left upper lobe is seen with associated bronchiectasis and surrounding architectural distortion, stable. Residual nodular septal thickening in the lingula and left lower lobe, as before. Small nodular lesions in both upper lobes are unchanged. No pleural fluid. Airway is unremarkable. Upper Abdomen: Visualized portions of the liver, adrenal glands, kidneys, spleen, pancreas, stomach and bowel are grossly unremarkable. No upper abdominal adenopathy. Musculoskeletal: Sclerotic lesion in the L2 vertebral body is unchanged. T12 sclerosis is also unchanged. IMPRESSION: 1. Posttreatment changes in the left hemithorax with residual confluent soft tissue in the left upper lobe, stable. 2. Bilateral upper lobe metastases are unchanged. 3. 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 4. T12 and L2 sclerotic metastases, stable.  Electronically Signed   By: MLorin PicketM.D.   On: 11/27/2016 15:44   Ct Abdomen Pelvis W Contrast  Result Date: 11/19/2016 CLINICAL DATA:  Abdominal pain, nausea and vomiting EXAM: CT ABDOMEN AND PELVIS WITH CONTRAST TECHNIQUE: Multidetector CT imaging of the abdomen and pelvis was performed using the standard protocol following bolus administration of intravenous contrast. CONTRAST:  1065mISOVUE-300 IOPAMIDOL (ISOVUE-300) INJECTION 61% COMPARISON:  CT abdomen pelvis 11/04/2016 FINDINGS: Lower chest: Unchanged appearance of left lower lobe adenocarcinoma. No pleural effusion. Hepatobiliary: Normal hepatic contours and density. Possible geographic fatty infiltration along the course of the falciform ligament, or transient hepatic attenuation difference. No visible biliary dilatation. Normal gallbladder. Pancreas: Normal contours without ductal dilatation. No peripancreatic fluid collection. Spleen: Normal. Adrenals/Urinary Tract: --Adrenal glands: Normal. --Right kidney/ureter: No hydronephrosis or perinephric stranding. No nephrolithiasis. No obstructing ureteral stones. --Left kidney/ureter: No hydronephrosis or perinephric stranding. No nephrolithiasis. No obstructing ureteral stones. --Urinary bladder: Unremarkable. Stomach/Bowel: --Stomach/Duodenum: No hiatal hernia or other gastric abnormality. Normal duodenal course and caliber. --Small bowel: No dilatation or  inflammation. --Colon: No focal abnormality. --Appendix: Normal. Vascular/Lymphatic: Atherosclerotic calcification is present within the non-aneurysmal abdominal aorta, without hemodynamically significant stenosis. No abdominal or pelvic lymphadenopathy. Reproductive: No free fluid in the pelvis. Musculoskeletal. Sclerotic lesion at the inferior endplate of L2 is unchanged. Well-circumscribed lytic lesion at the inferior endplate of L3 is also unchanged. Generalized sclerosis at T12 is also unchanged. Other: None. IMPRESSION: 1. No acute  abdominal or pelvic abnormality. 2. Unchanged appearance of left lower lobe adenocarcinoma with lepidic features. 3. Unchanged L3 lytic lesion and T12 and L2 sclerotic lesions. 4.  Aortic Atherosclerosis (ICD10-I70.0). Electronically Signed   By: Ulyses Jarred M.D.   On: 11/19/2016 22:49   US Abdomen Limited Ruq  Result Date: 11/20/2016 CLINICAL DATA:  Right upper quadrant pain. EXAM: ULTRASOUND ABDOMEN LIMITED RIGHT UPPER QUADRANT COMPARISON:  Abdominal CT 2 hours prior. FINDINGS: Gallbladder: Bowel gas partially obscures evaluation. Physiologically distended. No gallstones or wall thickening visualized. No sonographic Murphy sign noted by sonographer. Common bile duct: Diameter: 3 mm. Liver: Mild heterogeneous parenchymal echogenicity. No evidence of focal lesion. Portal vein is patent on color Doppler imaging with normal direction of blood flow towards the liver. IMPRESSION: No explanation for right upper quadrant pain. Normal sonographic appearance of the gallbladder. Electronically Signed   By: Jeb Levering M.D.   On: 11/20/2016 01:29     ASSESSMENT/PLAN:  Adenocarcinoma of left lung, stage 4 ([facility]) This is a very pleasant 56 year old African-American male with a stage IV non-small cell lung cancer, adenocarcinoma with no actionable mutations. He underwent systemic chemotherapy with carboplatin, Alimta and Avastin status post 6 cycles and has been tolerating the treatment fairly well. He is currently undergoing maintenance treatment with Alimta 500 MG/M2 every 3 weeks, status post 3 cycles The patient continues to tolerate this treatment fairly well with no significant adverse effects.    The patient was seen with [name].  Restaging CT scan of the chest was reviewed with the patient.  Explained to the patient that he has no evidence of disease progression.  Recommend for him to continue his current treatment with maintenance Alimta.  He will proceed with cycle #4 today as a  scheduled.  For the persistent abdominal pain questionable gastritis, he will keep his appointment as scheduled next week with gastroenterology for consideration of upper endoscopy. For hypertension, the patient will continue with his current blood pressure medication.  He was advised to call immediately if he has any concerning symptoms in the interval. The patient voices understanding of current disease status and treatment options and is in agreement with the current care plan. All questions were answered. The patient knows to call the clinic with any problems, questions or concerns. We can certainly see the patient much sooner if necessary.  No orders of the defined types were placed in this encounter.  Mikey Bussing, DNP, AGPCNP-BC, AOCNP 12/11/16  ADDENDUM: Hematology/Oncology Attending: I had a face-to-face encounter with the patient today.  I recommended his care plan.  This is a very pleasant 56 years old African-American male with a stage IV non-small cell lung cancer, adenocarcinoma. He is status post induction systemic chemotherapy with carboplatin, Alimta and Avastin for 6 cycles with partial response.  The patient is currently on maintenance treatment with Alimta status post 3 cycles and has been tolerating this treatment well.  He had a repeat CT scan of the chest.  I personally and independently reviewed the scans and discussed the results with the patient.  He has  a scan showed no evidence for disease progression. I recommended for him to continue his maintenance treatment with Alimta and he would proceed with cycle #4 today. For the persistent epigastric pain and anemia, we referred the patient to gastroenterology for evaluation and he is scheduled to be seen next week. For hypertension he will continue with his current blood pressure medication and the patient was advised to monitor his blood pressure closely at home and to report to his primary care physician if no  improvement. The patient was advised to call immediately if he has any concerning symptoms in the interval.  Disclaimer: This note was dictated with voice recognition software. Similar sounding words can inadvertently be transcribed and may be missed upon review. Eilleen Kempf, MD 12/11/16

## 2016-12-16 ENCOUNTER — Encounter (HOSPITAL_COMMUNITY): Payer: Self-pay | Admitting: Emergency Medicine

## 2016-12-16 ENCOUNTER — Other Ambulatory Visit: Payer: Self-pay

## 2016-12-16 ENCOUNTER — Encounter: Payer: Self-pay | Admitting: Physician Assistant

## 2016-12-16 ENCOUNTER — Ambulatory Visit (INDEPENDENT_AMBULATORY_CARE_PROVIDER_SITE_OTHER): Payer: Medicaid Other | Admitting: Physician Assistant

## 2016-12-16 ENCOUNTER — Emergency Department (HOSPITAL_COMMUNITY): Payer: Medicaid Other

## 2016-12-16 ENCOUNTER — Inpatient Hospital Stay (HOSPITAL_COMMUNITY)
Admission: EM | Admit: 2016-12-16 | Discharge: 2016-12-24 | DRG: 391 | Disposition: A | Discharge status: Home / Self Care | Payer: Medicaid Other | Attending: Internal Medicine | Admitting: Internal Medicine

## 2016-12-16 ENCOUNTER — Encounter (HOSPITAL_COMMUNITY): Payer: Self-pay | Admitting: Radiology

## 2016-12-16 VITALS — BP 154/84 | HR 120 | Ht 71.0 in | Wt 133.6 lb

## 2016-12-16 DIAGNOSIS — K210 Gastro-esophageal reflux disease with esophagitis: Secondary | ICD-10-CM | POA: Diagnosis present

## 2016-12-16 DIAGNOSIS — T451X5A Adverse effect of antineoplastic and immunosuppressive drugs, initial encounter: Secondary | ICD-10-CM | POA: Diagnosis present

## 2016-12-16 DIAGNOSIS — Z9221 Personal history of antineoplastic chemotherapy: Secondary | ICD-10-CM | POA: Diagnosis not present

## 2016-12-16 DIAGNOSIS — I10 Essential (primary) hypertension: Secondary | ICD-10-CM | POA: Diagnosis present

## 2016-12-16 DIAGNOSIS — K920 Hematemesis: Secondary | ICD-10-CM | POA: Diagnosis present

## 2016-12-16 DIAGNOSIS — Y92230 Patient room in hospital as the place of occurrence of the external cause: Secondary | ICD-10-CM | POA: Diagnosis present

## 2016-12-16 DIAGNOSIS — R0602 Shortness of breath: Secondary | ICD-10-CM

## 2016-12-16 DIAGNOSIS — E8770 Fluid overload, unspecified: Secondary | ICD-10-CM

## 2016-12-16 DIAGNOSIS — K319 Disease of stomach and duodenum, unspecified: Secondary | ICD-10-CM | POA: Diagnosis present

## 2016-12-16 DIAGNOSIS — N179 Acute kidney failure, unspecified: Secondary | ICD-10-CM | POA: Diagnosis present

## 2016-12-16 DIAGNOSIS — Z590 Homelessness: Secondary | ICD-10-CM

## 2016-12-16 DIAGNOSIS — R1013 Epigastric pain: Secondary | ICD-10-CM | POA: Diagnosis not present

## 2016-12-16 DIAGNOSIS — D62 Acute posthemorrhagic anemia: Secondary | ICD-10-CM | POA: Diagnosis present

## 2016-12-16 DIAGNOSIS — R0600 Dyspnea, unspecified: Secondary | ICD-10-CM | POA: Diagnosis not present

## 2016-12-16 DIAGNOSIS — Y95 Nosocomial condition: Secondary | ICD-10-CM | POA: Diagnosis not present

## 2016-12-16 DIAGNOSIS — D539 Nutritional anemia, unspecified: Secondary | ICD-10-CM | POA: Diagnosis present

## 2016-12-16 DIAGNOSIS — F17210 Nicotine dependence, cigarettes, uncomplicated: Secondary | ICD-10-CM | POA: Diagnosis present

## 2016-12-16 DIAGNOSIS — R110 Nausea: Secondary | ICD-10-CM | POA: Diagnosis not present

## 2016-12-16 DIAGNOSIS — R112 Nausea with vomiting, unspecified: Secondary | ICD-10-CM | POA: Diagnosis not present

## 2016-12-16 DIAGNOSIS — C3492 Malignant neoplasm of unspecified part of left bronchus or lung: Secondary | ICD-10-CM | POA: Diagnosis present

## 2016-12-16 DIAGNOSIS — K2970 Gastritis, unspecified, without bleeding: Secondary | ICD-10-CM | POA: Diagnosis present

## 2016-12-16 DIAGNOSIS — K219 Gastro-esophageal reflux disease without esophagitis: Secondary | ICD-10-CM | POA: Diagnosis not present

## 2016-12-16 DIAGNOSIS — C3432 Malignant neoplasm of lower lobe, left bronchus or lung: Secondary | ICD-10-CM | POA: Diagnosis present

## 2016-12-16 DIAGNOSIS — Z66 Do not resuscitate: Secondary | ICD-10-CM | POA: Diagnosis present

## 2016-12-16 DIAGNOSIS — J440 Chronic obstructive pulmonary disease with acute lower respiratory infection: Secondary | ICD-10-CM | POA: Diagnosis present

## 2016-12-16 DIAGNOSIS — E860 Dehydration: Secondary | ICD-10-CM | POA: Diagnosis present

## 2016-12-16 DIAGNOSIS — K3189 Other diseases of stomach and duodenum: Secondary | ICD-10-CM | POA: Diagnosis present

## 2016-12-16 DIAGNOSIS — D6959 Other secondary thrombocytopenia: Secondary | ICD-10-CM | POA: Diagnosis present

## 2016-12-16 DIAGNOSIS — Z79899 Other long term (current) drug therapy: Secondary | ICD-10-CM

## 2016-12-16 DIAGNOSIS — Z833 Family history of diabetes mellitus: Secondary | ICD-10-CM | POA: Diagnosis not present

## 2016-12-16 DIAGNOSIS — R1084 Generalized abdominal pain: Secondary | ICD-10-CM | POA: Diagnosis not present

## 2016-12-16 DIAGNOSIS — R1110 Vomiting, unspecified: Secondary | ICD-10-CM

## 2016-12-16 DIAGNOSIS — J449 Chronic obstructive pulmonary disease, unspecified: Secondary | ICD-10-CM | POA: Diagnosis present

## 2016-12-16 DIAGNOSIS — C7951 Secondary malignant neoplasm of bone: Secondary | ICD-10-CM | POA: Diagnosis present

## 2016-12-16 DIAGNOSIS — J189 Pneumonia, unspecified organism: Secondary | ICD-10-CM | POA: Diagnosis not present

## 2016-12-16 LAB — COMPREHENSIVE METABOLIC PANEL
ALT: 34 U/L (ref 17–63)
AST: 51 U/L — ABNORMAL HIGH (ref 15–41)
Albumin: 3.6 g/dL (ref 3.5–5.0)
Alkaline Phosphatase: 90 U/L (ref 38–126)
Anion gap: 11 (ref 5–15)
BUN: 23 mg/dL — ABNORMAL HIGH (ref 6–20)
CO2: 26 mmol/L (ref 22–32)
Calcium: 9.4 mg/dL (ref 8.9–10.3)
Chloride: 101 mmol/L (ref 101–111)
Creatinine, Ser: 1.61 mg/dL — ABNORMAL HIGH (ref 0.61–1.24)
GFR calc Af Amer: 53 mL/min — ABNORMAL LOW (ref >60)
GFR calc non Af Amer: 46 mL/min — ABNORMAL LOW (ref >60)
Glucose, Bld: 102 mg/dL — ABNORMAL HIGH (ref 65–99)
Potassium: 3.8 mmol/L (ref 3.5–5.1)
Sodium: 138 mmol/L (ref 135–145)
Total Bilirubin: 0.9 mg/dL (ref 0.3–1.2)
Total Protein: 8 g/dL (ref 6.5–8.1)

## 2016-12-16 LAB — TYPE AND SCREEN
ABO/RH(D): O POS
ANTIBODY SCREEN: NEGATIVE

## 2016-12-16 LAB — URINALYSIS, ROUTINE W REFLEX MICROSCOPIC
Bilirubin Urine: NEGATIVE
Glucose, UA: NEGATIVE mg/dL
Hgb urine dipstick: NEGATIVE
Ketones, ur: 5 mg/dL — AB
Leukocytes, UA: NEGATIVE
Nitrite: NEGATIVE
Protein, ur: NEGATIVE mg/dL
Specific Gravity, Urine: 1.016 (ref 1.005–1.030)
pH: 7 (ref 5.0–8.0)

## 2016-12-16 LAB — LIPASE, BLOOD: Lipase: 20 U/L (ref 11–51)

## 2016-12-16 LAB — CBC
HCT: 27.9 % — ABNORMAL LOW (ref 39.0–52.0)
Hemoglobin: 9.2 g/dL — ABNORMAL LOW (ref 13.0–17.0)
MCH: 35.1 pg — ABNORMAL HIGH (ref 26.0–34.0)
MCHC: 33 g/dL (ref 30.0–36.0)
MCV: 106.5 fL — ABNORMAL HIGH (ref 78.0–100.0)
Platelets: 252 10*3/uL (ref 150–400)
RBC: 2.62 MIL/uL — ABNORMAL LOW (ref 4.22–5.81)
RDW: 15.3 % (ref 11.5–15.5)
WBC: 8.4 10*3/uL (ref 4.0–10.5)

## 2016-12-16 LAB — I-STAT CG4 LACTIC ACID, ED: Lactic Acid, Venous: 0.95 mmol/L (ref 0.5–1.9)

## 2016-12-16 MED ORDER — IPRATROPIUM-ALBUTEROL 0.5-2.5 (3) MG/3ML IN SOLN
3.0000 mL | Freq: Four times a day (QID) | RESPIRATORY_TRACT | Status: DC | PRN
  Administered 2016-12-17: 3 mL via RESPIRATORY_TRACT
  Filled 2016-12-16 (×2): qty 3

## 2016-12-16 MED ORDER — IOPAMIDOL (ISOVUE-300) INJECTION 61%
INTRAVENOUS | Status: AC
  Administered 2016-12-16: 100 mL via INTRAVENOUS
  Filled 2016-12-16: qty 100

## 2016-12-16 MED ORDER — SODIUM CHLORIDE 0.9 % IV BOLUS (SEPSIS)
1000.0000 mL | Freq: Once | INTRAVENOUS | Status: AC
  Administered 2016-12-16: 1000 mL via INTRAVENOUS

## 2016-12-16 MED ORDER — MORPHINE SULFATE 15 MG PO TABS
15.0000 mg | ORAL_TABLET | ORAL | Status: DC | PRN
  Administered 2016-12-16 – 2016-12-23 (×5): 15 mg via ORAL
  Filled 2016-12-16 (×5): qty 1

## 2016-12-16 MED ORDER — ONDANSETRON HCL 4 MG/2ML IJ SOLN
4.0000 mg | Freq: Four times a day (QID) | INTRAMUSCULAR | Status: DC | PRN
  Administered 2016-12-17 – 2016-12-24 (×4): 4 mg via INTRAVENOUS
  Filled 2016-12-16 (×3): qty 2

## 2016-12-16 MED ORDER — ALBUTEROL SULFATE (2.5 MG/3ML) 0.083% IN NEBU
5.0000 mg | INHALATION_SOLUTION | Freq: Once | RESPIRATORY_TRACT | Status: AC
  Administered 2016-12-16: 5 mg via RESPIRATORY_TRACT
  Filled 2016-12-16: qty 6

## 2016-12-16 MED ORDER — SODIUM CHLORIDE 0.9% FLUSH
10.0000 mL | INTRAVENOUS | Status: DC | PRN
  Administered 2016-12-17 – 2016-12-23 (×4): 10 mL
  Filled 2016-12-16 (×4): qty 40

## 2016-12-16 MED ORDER — SODIUM CHLORIDE 0.9 % IV SOLN
INTRAVENOUS | Status: DC
  Administered 2016-12-16 – 2016-12-17 (×2): via INTRAVENOUS

## 2016-12-16 MED ORDER — OMEPRAZOLE 40 MG PO CPDR: 40.0000 mg | DELAYED_RELEASE_CAPSULE | Freq: Two times a day (BID) | ORAL | 3 refills | Status: DC

## 2016-12-16 MED ORDER — MORPHINE SULFATE (PF) 4 MG/ML IV SOLN
4.0000 mg | Freq: Once | INTRAVENOUS | Status: AC
  Administered 2016-12-16: 4 mg via INTRAVENOUS
  Filled 2016-12-16: qty 1

## 2016-12-16 MED ORDER — ONDANSETRON HCL 4 MG/2ML IJ SOLN
4.0000 mg | Freq: Once | INTRAMUSCULAR | Status: AC
  Administered 2016-12-16: 4 mg via INTRAVENOUS
  Filled 2016-12-16: qty 2

## 2016-12-16 MED ORDER — PANTOPRAZOLE SODIUM 40 MG IV SOLR
40.0000 mg | Freq: Two times a day (BID) | INTRAVENOUS | Status: DC
  Administered 2016-12-16 – 2016-12-17 (×3): 40 mg via INTRAVENOUS
  Filled 2016-12-16 (×3): qty 40

## 2016-12-16 MED ORDER — ONDANSETRON HCL 4 MG PO TABS: ORAL_TABLET | ORAL | 1 refills | Status: DC

## 2016-12-16 MED ORDER — AMLODIPINE BESYLATE 10 MG PO TABS
10.0000 mg | ORAL_TABLET | Freq: Every day | ORAL | Status: DC
  Administered 2016-12-16 – 2016-12-24 (×9): 10 mg via ORAL
  Filled 2016-12-16 (×9): qty 1

## 2016-12-16 MED ORDER — MORPHINE SULFATE (PF) 4 MG/ML IV SOLN
4.0000 mg | INTRAVENOUS | Status: DC | PRN
  Administered 2016-12-17 – 2016-12-24 (×8): 4 mg via INTRAVENOUS
  Filled 2016-12-16 (×8): qty 1

## 2016-12-16 MED ORDER — ACETAMINOPHEN 325 MG PO TABS
650.0000 mg | ORAL_TABLET | Freq: Four times a day (QID) | ORAL | Status: DC | PRN
  Administered 2016-12-16 – 2016-12-22 (×6): 650 mg via ORAL
  Filled 2016-12-16 (×6): qty 2

## 2016-12-16 MED ORDER — IOPAMIDOL (ISOVUE-300) INJECTION 61%
100.0000 mL | Freq: Once | INTRAVENOUS | Status: AC | PRN
  Administered 2016-12-16: 100 mL via INTRAVENOUS

## 2016-12-16 NOTE — Progress Notes (Signed)
I agree with the above note, plan 

## 2016-12-16 NOTE — ED Triage Notes (Signed)
Per pt, sent by GI MD, states SOB and abdominal pain-states he is out of his inhaler-states pain all over-no N/V

## 2016-12-16 NOTE — H&P (Signed)
History and Physical    [name] KYH:[id number] DOB: 06/28/1960 DOA: 12/16/2016  PCP: Patient, No Pcp Per  Patient coming from: home  I have personally briefly reviewed patient's old medical records in [place]  Chief Complaint: Nausea vomiting and hematemesis  HPI: [name] is a 56 y.o. male with medical history significant of past medical history of metastatic adenocarcinoma of the lung undergoing chemotherapy on his fourth cycle (started 7 months ago) with ongoing tobacco abuse with multiple visits to the ED in the last 8 months for epigastric pain nausea and vomiting discharged home on Protonix with minimal improvement.  Went to the GI doctor today riding in epigastric pain and multiple episode of nausea and vomiting, and referred by the gastroenterologist to the ED.  He tells me that every time he eats he throws up he has not been able to hydrate himself in the last for 5 days, initially he was taking Protonix and Zofran which would help, but now not working.  He relates his pain has no radiation no aggravating or alleviating symptoms.  He relates that on the day of admission he had an episode of hematemesis after throwing up several times in the morning.  All liquid bright red blood.  And he was unable to take his medication.  He also relates some weight loss and he relates some reflux symptoms.  ED Course:  In the ED a CT scan of the abdomen and pelvis was done that showed circumferential wall thickening of the esophageal junction L2 lytic lesion, new acute renal failure with a hemoglobin of 9 platelet of 252 and a white count of 8.4, specific gravity is 1016  Review of Systems: As per HPI otherwise 10 point review of systems negative.    Past Medical History:  Diagnosis Date  . Abdominal pain 06/04/2016  . Adenocarcinoma of left lung, stage 4 ([place]) 05/02/2016  . Bronchitis due to tobacco use ([place])   . Cancer ([place])    Lung  . Dehydration 06/04/2016  . Encounter for  antineoplastic chemotherapy 05/02/2016  . Goals of care, counseling/discussion 05/02/2016  . HTN (hypertension) 10/30/2016  . Recurrent upper respiratory infection (URI)   . Seizures ([place]) 05/2011   new onset  . Shortness of breath     Past Surgical History:  Procedure Laterality Date  . IR FLUORO GUIDE PORT INSERTION RIGHT  05/13/2016  . IR US GUIDE VASC ACCESS RIGHT  05/13/2016  . NO PAST SURGERIES       reports that he has been smoking cigarettes.  He has a 3.00 pack-year smoking history. he has never used smokeless tobacco. He reports that he does not drink alcohol or use drugs.  No Known Allergies  Family History  Problem Relation Age of Onset  . Cancer Father   . Diabetes Mellitus II Mother      Prior to Admission medications   Medication Sig Start Date End Date Taking? Authorizing Provider  acetaminophen (TYLENOL) 500 MG tablet Take 500 mg by mouth 2 (two) times daily as needed for mild pain or headache.   Yes [provider]  amLODipine (NORVASC) 10 MG tablet Take 1 tablet (10 mg total) by mouth daily. 10/30/16  Yes Curt Bears, MD  Chlorphen-Phenyleph-ASA (ALKA-SELTZER PLUS COLD PO) Take 2 tablets by mouth at bedtime as needed (COUGH).   Yes [provider]  dexamethasone (DECADRON) 4 MG tablet 4 mg by mouth twice a day the day before, day of and day after  the chemotherapy every 3 weeks 09/25/16  Yes Curt Bears, MD  famotidine (PEPCID) 40 MG tablet Take 1 tablet (40 mg total) by mouth daily. 11/18/16  Yes Recardo Evangelist, PA-C  Ipratropium-Albuterol (COMBIVENT) 20-100 MCG/ACT AERS respimat Inhale 1 puff into the lungs every 6 (six) hours. Patient taking differently: Inhale 1 puff into the lungs every 6 (six) hours as needed for wheezing or shortness of breath.  07/22/16  Yes Curt Bears, MD  lidocaine-prilocaine (EMLA) cream Apply 1 application topically as needed. Patient taking differently: Apply 1 application topically as needed (port).   05/08/16  Yes Curt Bears, MD  morphine (MSIR) 15 MG tablet Take 1 tablet (15 mg total) every 4 (four) hours as needed by mouth for severe pain. 12/04/16  Yes Curcio, Roselie Awkward, NP  omeprazole (PRILOSEC) 40 MG capsule Take 1 capsule (40 mg total) by mouth 2 (two) times daily. Patient not taking: Reported on 12/16/2016 12/16/16   Levin Erp, PA  ondansetron (ZOFRAN ODT) 4 MG disintegrating tablet Take 1 tablet (4 mg total) by mouth every 8 (eight) hours as needed for nausea or vomiting. Patient not taking: Reported on 12/16/2016 11/20/16   Merryl Hacker, MD  ondansetron [facility]) 4 MG tablet 2 tabs every 4-6 hours Patient not taking: Reported on 12/16/2016 12/16/16   Levin Erp, [place]    Physical Exam: Vitals:   12/16/16 1432 12/16/16 1500 12/16/16 1530 12/16/16 1600  BP: (!) 186/101 (!) 144/88 (!) 150/85 134/74  Pulse: (!) 102 (!) 106 100 97  Resp: (!) 27 (!) 30 (!) 26 (!) 26  Temp:      TempSrc:      SpO2: 95% 97% 95% 94%  Weight:      Height:        Constitutional: NAD, calm, comfortable, cachectic Vitals:   12/16/16 1432 12/16/16 1500 12/16/16 1530 12/16/16 1600  BP: (!) 186/101 (!) 144/88 (!) 150/85 134/74  Pulse: (!) 102 (!) 106 100 97  Resp: (!) 27 (!) 30 (!) 26 (!) 26  Temp:      TempSrc:      SpO2: 95% 97% 95% 94%  Weight:      Height:       Eyes: PERRL, lids and conjunctivae normal. ENMT: Poor oral hygiene mucous membrane is dry. Neck: normal, supple, no masses, no thyromegaly Respiratory: clear to auscultation bilaterally, no wheezing, no crackles. Normal respiratory effort. No accessory muscle use.  Cardiovascular: Regular rate and rhythm, no murmurs / rubs / gallops. No extremity edema. 2+ pedal pulses. No carotid bruits.  Abdomen: Positive bowel sounds some guarding no rebound exquisite tenderness in the epigastric area right upper quadrant and left upper quadrant, Musculoskeletal: no clubbing / cyanosis. No joint deformity upper and  lower extremities. Good ROM, no contractures. Normal muscle tone.  Skin: no rashes, lesions, ulcers.  Multiple thickening of the skin throughout. Neurologic: CN 2-12 grossly intact. Sensation intact, DTR normal. Strength 5/5 in all 4.  Psychiatric: Normal judgment and insight. Alert and oriented x 3. Normal mood.    Labs on Admission: I have personally reviewed following labs and imaging studies  CBC: Recent Labs  Lab 12/11/16 0942 12/16/16 1138  WBC 5.5 8.4  NEUTROABS 3.7  --   HGB 9.1* 9.2*  HCT 28.2* 27.9*  MCV 111.0* 106.5*  PLT 279 846   Basic Metabolic Panel: Recent Labs  Lab 12/11/16 0942 12/16/16 1138  NA 142 138  K 3.9 3.8  CL  --  101  CO2 30* 26  GLUCOSE 80 102*  BUN 10.5 23*  CREATININE 1.2 1.61*  CALCIUM 9.6 9.4   GFR: Estimated Creatinine Clearance: 44.2 mL/min (A) (by C-G formula based on SCr of 1.61 mg/dL (H)). Liver Function Tests: Recent Labs  Lab 12/11/16 0942 12/16/16 1138  AST 33 51*  ALT 24 34  ALKPHOS 104 90  BILITOT 0.29 0.9  PROT 7.4 8.0  ALBUMIN 3.1* 3.6   Recent Labs  Lab 12/16/16 1138  LIPASE 20   No results for input(s): AMMONIA in the last 168 hours. Coagulation Profile: No results for input(s): INR, PROTIME in the last 168 hours. Cardiac Enzymes: No results for input(s): CKTOTAL, CKMB, CKMBINDEX, TROPONINI in the last 168 hours. BNP (last 3 results) No results for input(s): PROBNP in the last 8760 hours. HbA1C: No results for input(s): HGBA1C in the last 72 hours. CBG: No results for input(s): GLUCAP in the last 168 hours. Lipid Profile: No results for input(s): CHOL, HDL, LDLCALC, TRIG, CHOLHDL, LDLDIRECT in the last 72 hours. Thyroid Function Tests: No results for input(s): TSH, T4TOTAL, FREET4, T3FREE, THYROIDAB in the last 72 hours. Anemia Panel: No results for input(s): VITAMINB12, FOLATE, FERRITIN, TIBC, IRON, RETICCTPCT in the last 72 hours. Urine analysis:    Component Value Date/Time   COLORURINE YELLOW  12/16/2016 [place] 12/16/2016 1444   LABSPEC 1.016 12/16/2016 1444   PHURINE 7.0 12/16/2016 1444   GLUCOSEU NEGATIVE 12/16/2016 1444   HGBUR NEGATIVE 12/16/2016 1444   BILIRUBINUR NEGATIVE 12/16/2016 1444   KETONESUR 5 (A) 12/16/2016 1444   PROTEINUR NEGATIVE 12/16/2016 1444   UROBILINOGEN 0.2 05/29/2011 0122   NITRITE NEGATIVE 12/16/2016 1444   LEUKOCYTESUR NEGATIVE 12/16/2016 1444    Radiological Exams on Admission: Dg Chest 2 View  Result Date: 12/16/2016 CLINICAL DATA:  Short of breath.  Nausea. EXAM: CHEST  2 VIEW COMPARISON:  CT 11/27/2016 FINDINGS: Prior port in the RIGHT chest wall. Tip in distal SVC. Mild central interstitial thickening. No focal consolidation. No pneumothorax. Small LEFT effusion. LEFT suprahilar mass noted in and compares to CT 11/27/2016 IMPRESSION: 1. No acute cardiopulmonary findings. 2. Persists interstitial thickening centrally. 3. Small LEFT effusion. 4. Stable LEFT upper lobe mass. Electronically Signed   By: Suzy Bouchard M.D.   On: 12/16/2016 12:27   Ct Abdomen Pelvis W Contrast  Result Date: 12/16/2016 CLINICAL DATA:  Abdominal pain with fever and abdominal distention. Stage IV adenocarcinoma of the lung. EXAM: CT ABDOMEN AND PELVIS WITH CONTRAST TECHNIQUE: Multidetector CT imaging of the abdomen and pelvis was performed using the standard protocol following bolus administration of intravenous contrast. CONTRAST:  100 cc Isovue-300 COMPARISON:  09/19/2016 FINDINGS: Lower chest:  Stable interstitial thickening left base. Hepatobiliary: No focal abnormality within the liver parenchyma. There is no evidence for gallstones, gallbladder wall thickening, or pericholecystic fluid. No intrahepatic or extrahepatic biliary dilation. Pancreas: No focal mass lesion. No dilatation of the main duct. No intraparenchymal cyst. No peripancreatic edema. Spleen: No splenomegaly. No focal mass lesion. Adrenals/Urinary Tract: No adrenal nodule or mass.  Kidneys unremarkable No evidence for hydroureter. The urinary bladder appears normal for the degree of distention. Stomach/Bowel: Circumferential wall thickening noted distal esophagus. There appears to be some free fluid around the distal esophagus and esophagogastric junction. Stomach otherwise unremarkable. Duodenum is normally positioned as is the ligament of Treitz. No evidence for small bowel obstruction. Appendix is not well seen but is probably visible on coronal images. No edema or inflammatory change in the pericecal  region. No colonic dilatation. No findings to suggest colonic diverticulitis. Vascular/Lymphatic: There is abdominal aortic atherosclerosis without aneurysm. There is no gastrohepatic or hepatoduodenal ligament lymphadenopathy. No intraperitoneal or retroperitoneal lymphadenopathy. No pelvic sidewall lymphadenopathy. Reproductive: The prostate gland and seminal vesicles have normal imaging features. Other: No intraperitoneal free fluid. No evidence for intraperitoneal free air. No pneumomediastinum visible in the lower chest. Musculoskeletal: Sclerotic lesion previously described in L2 is similar. Lucent lesion in L3 is unchanged. Diffuse sclerosis of the T12 vertebral body is similar to prior. IMPRESSION: 1. Circumferential wall thickening in the distal esophagus tracking into the esophagogastric junction. There is some mild adjacent edema but no extraluminal gas. Esophagitis or distal esophageal neoplasm could have this appearance. 2. No evidence for bowel obstruction. 3. No evidence for colonic diverticulitis. 4.  Aortic Atherosclerois (ICD10-170.0) 5. Similar appearance sclerotic lesion in L2 with diffuse sclerosis of the T12 vertebral body. No change lucent lesion in the L3 body. Electronically Signed   By: Misty Stanley M.D.   On: 12/16/2016 14:54    EKG: Independently reviewed.  Sinus tachycardia normal axis nonspecific T wave changes.  Assessment/Plan Hematemesis/ Intractable  nausea and vomiting: Place 2 large-bore his IVs, hydrate him aggressively. On 10.31.2018 his hemoglobin was 11.3 on a visit to the ED was 11.21 it was 9.1 today on his visit to the ED is 9.2.  He relates he had an episode of hematemesis this morning at his house, he denies any melanotic stools or bright red blood per rectum.  FOBT stools. CT scan of the abdomen and pelvis shows wall thickening of the esophageal junction. I would admit him to telemetry start him on IV Protonix hydrate him aggressively will type and screen him 2 units of packed red blood cells. We have already consulted GI the [facility] for possible upper endoscopy tomorrow morning. We will place him n.p.o.  Have ice chips  Acute kidney injury: Likely prerenal in etiology, will hydrate him aggressively and recheck basic metabolic panel in the morning.  Adenocarcinoma of left lung, stage 4 (HCC) Follow-up with [name] as an outpatient.  Chronic obstructive pulmonary disease (HCC) To current medication.  Essential HTN (hypertension): Blood pressures running on the high side we will continue his home dose of Norvasc.  DVT prophylaxis: SCD's Code Status: full Family Communication: none Disposition Plan:  Consults called: GI Dr. Silvio Pate Admission status: inpatient   Charlynne Cousins MD [facility] Pager [phone]  If 7PM-7AM, please contact night-coverage [email or web address] Password [facility]  12/16/2016, 4:28 PM

## 2016-12-16 NOTE — Patient Instructions (Signed)
Go to the ER.   Take Omeprazole 40 mg twice a day 30- 60 minutes before breakfast and dinner.   Take Zofran 4 mg 2 tablets every 4-6 hours.

## 2016-12-16 NOTE — Plan of Care (Signed)
  Education: Knowledge of General Education information will improve 12/16/2016 2216 - Progressing by Ashley Murrain, RN   Health Behavior/Discharge Planning: Ability to manage health-related needs will improve 12/16/2016 2216 - Progressing by Ashley Murrain, RN   Clinical Measurements: Ability to maintain clinical measurements within normal limits will improve 12/16/2016 2216 - Progressing by Augusta Mirkin, Sherryll Burger, RN

## 2016-12-16 NOTE — ED Provider Notes (Signed)
[facility] DEPT Provider Note   CSN: [id number] Arrival date & time: 12/16/16  1042     History   Chief Complaint Chief Complaint  Patient presents with  . Abdominal Pain  . Shortness of Breath    HPI ROMUALD MCCASLIN is a 56 y.o. male.  HPI Patient presents to the emergency department with nausea vomiting and abdominal pain.  The patient states that this is been ongoing for several weeks but started with a nausea vomiting over the last 3 days.  Patient states that he was seen at the GI doctor's office today and they sent him in for further evaluation due to the significant amount of abdominal discomfort that he is experiencing.  The patient states that he has had the pain for quite a while but the nausea vomiting and diarrhea have been new findings.  The patient states that he did not take any medications prior to arrival for symptoms.  Patient states movement and palpation make the abdominal pain worse.  The patient denies chest pain, shortness of breath, headache,blurred vision, neck pain, fever, cough, weakness, numbness, dizziness, anorexia, edema,  rash, back pain, dysuria, hematemesis, bloody stool, near syncope, or syncope. Past Medical History:  Diagnosis Date  . Abdominal pain 06/04/2016  . Adenocarcinoma of left lung, stage 4 ([place]) 05/02/2016  . Bronchitis due to tobacco use ([place])   . Cancer ([place])    Lung  . Dehydration 06/04/2016  . Encounter for antineoplastic chemotherapy 05/02/2016  . Goals of care, counseling/discussion 05/02/2016  . HTN (hypertension) 10/30/2016  . Recurrent upper respiratory infection (URI)   . Seizures ([place]) 05/2011   new onset  . Shortness of breath     Patient Active Problem List   Diagnosis Date Noted  . HTN (hypertension) 10/30/2016  . Chronic obstructive pulmonary disease ([place]) 09/02/2016  . COPD (chronic obstructive pulmonary disease) ([place]) 08/19/2016  . Dehydration 06/04/2016  . Abdominal pain 06/04/2016    . 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 Adenocarcinoma of left lung, stage 4 ([place]) 05/02/2016  . Goals of care, counseling/discussion 05/02/2016  . Encounter for antineoplastic chemotherapy 05/02/2016  . Tobacco abuse 05/30/2011  . Alcohol abuse 05/30/2011  . Seizure ([place]) 05/28/2011    Past Surgical History:  Procedure Laterality Date  . IR FLUORO GUIDE PORT INSERTION RIGHT  05/13/2016  . IR US GUIDE VASC ACCESS RIGHT  05/13/2016  . NO PAST SURGERIES         Home Medications    Prior to Admission medications   Medication Sig Start Date End Date Taking? Authorizing Provider  acetaminophen (TYLENOL) 500 MG tablet Take 500 mg by mouth 2 (two) times daily as needed for mild pain or headache.   Yes [provider]  amLODipine (NORVASC) 10 MG tablet Take 1 tablet (10 mg total) by mouth daily. 10/30/16  Yes Curt Bears, MD  Chlorphen-Phenyleph-ASA (ALKA-SELTZER PLUS COLD PO) Take 2 tablets by mouth at bedtime as needed (COUGH).   Yes [provider]  dexamethasone (DECADRON) 4 MG tablet 4 mg by mouth twice a day the day before, day of and day after the chemotherapy every 3 weeks 09/25/16  Yes Curt Bears, MD  famotidine (PEPCID) 40 MG tablet Take 1 tablet (40 mg total) by mouth daily. 11/18/16  Yes Recardo Evangelist, PA-C  Ipratropium-Albuterol (COMBIVENT) 20-100 MCG/ACT AERS respimat Inhale 1 puff into the lungs every 6 (six) hours. Patient taking differently: Inhale 1 puff into the lungs every 6 (six) hours as  needed for wheezing or shortness of breath.  07/22/16  Yes Curt Bears, MD  lidocaine-prilocaine (EMLA) cream Apply 1 application topically as needed. Patient taking differently: Apply 1 application topically as needed (port).  05/08/16  Yes Curt Bears, MD  morphine (MSIR) 15 MG tablet Take 1 tablet (15 mg total) every 4 (four) hours as needed by mouth for severe pain. 12/04/16  Yes Curcio, Roselie Awkward, NP  omeprazole (PRILOSEC) 40 MG capsule Take 1  capsule (40 mg total) by mouth 2 (two) times daily. Patient not taking: Reported on 12/16/2016 12/16/16   Levin Erp, PA  ondansetron (ZOFRAN ODT) 4 MG disintegrating tablet Take 1 tablet (4 mg total) by mouth every 8 (eight) hours as needed for nausea or vomiting. Patient not taking: Reported on 12/16/2016 11/20/16   Merryl Hacker, MD  ondansetron [facility]) 4 MG tablet 2 tabs every 4-6 hours Patient not taking: Reported on 12/16/2016 12/16/16   Levin Erp, PA    Family History Family History  Problem Relation Age of Onset  . Cancer Father     Social History Social History   Tobacco Use  . Smoking status: Current Some Day Smoker    Packs/day: 0.00    Years: 30.00    Pack years: 0.00    Types: Cigarettes  . Smokeless tobacco: Never Used  Substance Use Topics  . Alcohol use: No    Comment: occasional  . Drug use: No     Allergies   Patient has no known allergies.   Review of Systems Review of Systems All other systems negative except as documented in the HPI. All pertinent positives and negatives as reviewed in the HPI.  Physical Exam Updated Vital Signs BP (!) 144/88   Pulse (!) 106   Temp 97.8 F (36.6 C) (Oral)   Resp (!) 30   Ht 5\' 11"  (1.803 m)   Wt 61.7 kg (136 lb)   SpO2 97%   BMI 18.97 kg/m   Physical Exam  Constitutional: He is oriented to person, place, and time. He appears well-developed and well-nourished. No distress.  HENT:  Head: Normocephalic and atraumatic.  Mouth/Throat: Oropharynx is clear and moist.  Eyes: Pupils are equal, round, and reactive to light.  Neck: Normal range of motion. Neck supple.  Cardiovascular: Normal rate, regular rhythm and normal heart sounds. Exam reveals no gallop and no friction rub.  No murmur heard. Pulmonary/Chest: Effort normal and breath sounds normal. No respiratory distress. He has no wheezes.  Abdominal: Soft. Bowel sounds are normal. He exhibits no distension. There is  generalized tenderness. There is guarding. There is no rigidity and no tenderness at McBurney's point. No hernia. Hernia confirmed negative in the ventral area.  Neurological: He is alert and oriented to person, place, and time. He exhibits normal muscle tone. Coordination normal.  Skin: Skin is warm and dry. Capillary refill takes less than 2 seconds. No rash noted. No erythema.  Psychiatric: He has a normal mood and affect. His behavior is normal.  Nursing note and vitals reviewed.    ED Treatments / Results  Labs (all labs ordered are listed, but only abnormal results are displayed) Labs Reviewed  COMPREHENSIVE METABOLIC PANEL - Abnormal; Notable for the following components:      Result Value   Glucose, Bld 102 (*)    BUN 23 (*)    Creatinine, Ser 1.61 (*)    AST 51 (*)    GFR calc non Af Amer 46 (*)  GFR calc Af Amer 53 (*)    All other components within normal limits  CBC - Abnormal; Notable for the following components:   RBC 2.62 (*)    Hemoglobin 9.2 (*)    HCT 27.9 (*)    MCV 106.5 (*)    MCH 35.1 (*)    All other components within normal limits  URINALYSIS, ROUTINE W REFLEX MICROSCOPIC - Abnormal; Notable for the following components:   Ketones, ur 5 (*)    All other components within normal limits  LIPASE, BLOOD  I-STAT CG4 LACTIC ACID, ED    EKG  EKG Interpretation None       Radiology Dg Chest 2 View  Result Date: 12/16/2016 CLINICAL DATA:  Short of breath.  Nausea. EXAM: CHEST  2 VIEW COMPARISON:  CT 11/27/2016 FINDINGS: Prior port in the RIGHT chest wall. Tip in distal SVC. Mild central interstitial thickening. No focal consolidation. No pneumothorax. Small LEFT effusion. LEFT suprahilar mass noted in and compares to CT 11/27/2016 IMPRESSION: 1. No acute cardiopulmonary findings. 2. Persists interstitial thickening centrally. 3. Small LEFT effusion. 4. Stable LEFT upper lobe mass. Electronically Signed   By: Suzy Bouchard M.D.   On: 12/16/2016 12:27     Ct Abdomen Pelvis W Contrast  Result Date: 12/16/2016 CLINICAL DATA:  Abdominal pain with fever and abdominal distention. Stage IV adenocarcinoma of the lung. EXAM: CT ABDOMEN AND PELVIS WITH CONTRAST TECHNIQUE: Multidetector CT imaging of the abdomen and pelvis was performed using the standard protocol following bolus administration of intravenous contrast. CONTRAST:  100 cc Isovue-300 COMPARISON:  09/19/2016 FINDINGS: Lower chest:  Stable interstitial thickening left base. Hepatobiliary: No focal abnormality within the liver parenchyma. There is no evidence for gallstones, gallbladder wall thickening, or pericholecystic fluid. No intrahepatic or extrahepatic biliary dilation. Pancreas: No focal mass lesion. No dilatation of the main duct. No intraparenchymal cyst. No peripancreatic edema. Spleen: No splenomegaly. No focal mass lesion. Adrenals/Urinary Tract: No adrenal nodule or mass. Kidneys unremarkable No evidence for hydroureter. The urinary bladder appears normal for the degree of distention. Stomach/Bowel: Circumferential wall thickening noted distal esophagus. There appears to be some free fluid around the distal esophagus and esophagogastric junction. Stomach otherwise unremarkable. Duodenum is normally positioned as is the ligament of Treitz. No evidence for small bowel obstruction. Appendix is not well seen but is probably visible on coronal images. No edema or inflammatory change in the pericecal region. No colonic dilatation. No findings to suggest colonic diverticulitis. Vascular/Lymphatic: There is abdominal aortic atherosclerosis without aneurysm. There is no gastrohepatic or hepatoduodenal ligament lymphadenopathy. No intraperitoneal or retroperitoneal lymphadenopathy. No pelvic sidewall lymphadenopathy. Reproductive: The prostate gland and seminal vesicles have normal imaging features. Other: No intraperitoneal free fluid. No evidence for intraperitoneal free air. No pneumomediastinum  visible in the lower chest. Musculoskeletal: Sclerotic lesion previously described in L2 is similar. Lucent lesion in L3 is unchanged. Diffuse sclerosis of the T12 vertebral body is similar to prior. IMPRESSION: 1. Circumferential wall thickening in the distal esophagus tracking into the esophagogastric junction. There is some mild adjacent edema but no extraluminal gas. Esophagitis or distal esophageal neoplasm could have this appearance. 2. No evidence for bowel obstruction. 3. No evidence for colonic diverticulitis. 4.  Aortic Atherosclerois (ICD10-170.0) 5. Similar appearance sclerotic lesion in L2 with diffuse sclerosis of the T12 vertebral body. No change lucent lesion in the L3 body. Electronically Signed   By: Misty Stanley M.D.   On: 12/16/2016 14:54    Procedures Procedures (including critical  care time)  Medications Ordered in ED Medications  morphine 4 MG/ML injection 4 mg (not administered)  ondansetron (ZOFRAN) injection 4 mg (not administered)  albuterol (PROVENTIL) (2.5 MG/3ML) 0.083% nebulizer solution 5 mg (5 mg Nebulization Given 12/16/16 1122)  iopamidol (ISOVUE-300) 61 % injection 100 mL (100 mLs Intravenous Contrast Given 12/16/16 1357)  sodium chloride 0.9 % bolus 1,000 mL (1,000 mLs Intravenous New Bag/Given 12/16/16 1525)     Initial Impression / Assessment and Plan / ED Course  I have reviewed the triage vital signs and the nursing notes.  Pertinent labs & imaging results that were available during my care of the patient were reviewed by me and considered in my medical decision making (see chart for details).    Patient will need admission to the hospital for dehydration and further workup of his abdominal discomfort he has been tachycardic here in the emergency department as well he does have an elevation in his creatinine.  Spoke with the [facility] who will evaluate the patient.  Final Clinical Impressions(s) / ED Diagnoses   Final diagnoses:  None     ED Discharge Orders    None       Dalia Heading, PA-C 12/16/16 1616    Virgel Manifold, MD 12/17/16 1110

## 2016-12-16 NOTE — Progress Notes (Signed)
Chief Complaint: Epigastric abdominal pain, GERD, Nausea and Vomiting, Hematemesis  HPI:    [name] is a 56 year old African-American male with a past medical history as listed below including adenocarcinoma of the left lung currently undergoing his fourth round of palliative chemotherapy, who was referred to me by Curt Bears, MD for a complaint of epigastric pain, nausea and vomiting.      Per review of chart it appears patient has been seen up to 4 times in the ER in the past month for acute gastritis, pain disorder and epigastric pain as well as nausea and vomiting.  It was thought he had an element of gastritis.  Patient has also requested pain medications at those times.  Patient had a CT of the abdomen and pelvis with contrast on 11/19/16 which showed no acute abdominal or pelvic abnormality, unchanged appearance of left lower lobe adenocarcinoma, unchanged L3 lytic lesions at T12 and L2 as well as aortic atherosclerosis.  Patient had an ultrasound of the abdomen 11/19/16 which showed no explanation for right upper quadrant pain.  Normal sonographic appearance of the gallbladder.  Labs were completed 12/11/16 with a CBC showing hemoglobin of 9.1 (slightly lower than baseline around 10.23 weeks ago) and CMP with albumin low at 3.1 and otherwise normal.    Patient presents to clinic today and is bent over in pain at time of my exam/interview.  The patient is crying.  Patient tells me that he has been in extreme epigastric pain for months now and has had cycles of nausea and vomiting ever since starting his chemotherapy around 7 months ago.  He explains that he has been able to manage this with antinausea medicines as well as morphine, but most recently the day after Thanksgiving the patient had another episode which has continued since that time.  Patient tells me that about 10 minutes after eating or drinking anything it will "come right back up".  He has been a unable to keep any of his  medicines down or any of his food over the past 3 days.  Patient usually takes Omeprazole 20 mg once daily and Famotidine 40 mg once daily as well as Zofran 4 mg and Phenergan 25 mg once daily.  He has been unable to do this recently.  Patient describes agonizing abdominal pain and cannot even touch his abdomen due to this pain.  This is rated as a 10/10 and sharp in nature.  He also describes several episodes of hematemesis, the last being this morning.  Patient tells me that this morning this was "all liquid and bright red blood".  Patient tells me he has been unable to take his pain pills due to his nausea and vomiting.  He has also started to lose some weight over the past week since his last chemotherapy treatment. Associated symptoms include reflux and heartburn.      Patient denies fever, chills or change in bowel habits.  Past Medical History:  Diagnosis Date  . Abdominal pain 06/04/2016  . Adenocarcinoma of left lung, stage 4 ([place]) 05/02/2016  . Bronchitis due to tobacco use ([place])   . Cancer ([place])    Lung  . Dehydration 06/04/2016  . Encounter for antineoplastic chemotherapy 05/02/2016  . Goals of care, counseling/discussion 05/02/2016  . HTN (hypertension) 10/30/2016  . Recurrent upper respiratory infection (URI)   . Seizures ([place]) 05/2011   new onset  . Shortness of breath     Past Surgical History:  Procedure Laterality Date  .  IR FLUORO GUIDE PORT INSERTION RIGHT  05/13/2016  . IR US GUIDE VASC ACCESS RIGHT  05/13/2016  . NO PAST SURGERIES      Current Outpatient Medications  Medication Sig Dispense Refill  . amLODipine (NORVASC) 10 MG tablet Take 1 tablet (10 mg total) by mouth daily. 30 tablet 2  . dexamethasone (DECADRON) 4 MG tablet 4 mg by mouth twice a day the day before, day of and day after the chemotherapy every 3 weeks 40 tablet 1  . famotidine (PEPCID) 40 MG tablet Take 1 tablet (40 mg total) by mouth daily. 15 tablet 0  . Ipratropium-Albuterol (COMBIVENT) 20-100  MCG/ACT AERS respimat Inhale 1 puff into the lungs every 6 (six) hours. (Patient taking differently: Inhale 1 puff into the lungs every 6 (six) hours as needed for wheezing or shortness of breath. ) 1 Inhaler 0  . lidocaine-prilocaine (EMLA) cream Apply 1 application topically as needed. (Patient taking differently: Apply 1 application topically as needed (port). ) 30 g 0  . morphine (MSIR) 15 MG tablet Take 1 tablet (15 mg total) every 4 (four) hours as needed by mouth for severe pain. 60 tablet 0  . omeprazole (PRILOSEC) 20 MG capsule Take 1 capsule (20 mg total) by mouth daily. 30 capsule 0  . ondansetron (ZOFRAN ODT) 4 MG disintegrating tablet Take 1 tablet (4 mg total) by mouth every 8 (eight) hours as needed for nausea or vomiting. 20 tablet 0  . promethazine (PHENERGAN) 25 MG tablet Take 25 mg by mouth every 6 (six) hours as needed for nausea/vomiting.  0   No current facility-administered medications for this visit.     Allergies as of 12/16/2016  . (No Known Allergies)    No family history on file.  Social History   Socioeconomic History  . Marital status: Single    Spouse name: Not on file  . Number of children: Not on file  . Years of education: Not on file  . Highest education level: Not on file  Social Needs  . Financial resource strain: Not on file  . Food insecurity - worry: Not on file  . Food insecurity - inability: Not on file  . Transportation needs - medical: Not on file  . Transportation needs - non-medical: Not on file  Occupational History  . Not on file  Tobacco Use  . Smoking status: Current Some Day Smoker    Packs/day: 0.00    Years: 30.00    Pack years: 0.00    Types: Cigarettes  . Smokeless tobacco: Never Used  Substance and Sexual Activity  . Alcohol use: No    Comment: occasional  . Drug use: No  . Sexual activity: Yes  Other Topics Concern  . Not on file  Social History Narrative  . Not on file    Review of Systems:    Constitutional:  No fever or chills Skin: No rash Cardiovascular: No chest pain Respiratory: Chronic cough and SOB Gastrointestinal: See HPI and otherwise negative Genitourinary: No dysuria or change in urinary frequency Neurological: No headache, dizziness or syncope Musculoskeletal: Chronic back pain Hematologic: No bruising Psychiatric: Tearful at time of my exam   Physical Exam:  Vital signs: BP (!) 154/84   Pulse (!) 120   Ht 5\' 11"  (1.803 m)   Wt 133 lb 9.6 oz (60.6 kg)   SpO2 93%   BMI 18.63 kg/m    Constitutional:   Ill appearing, thin, AA male appears to be in moderate distress, alert  and cooperative Head:  Normocephalic and atraumatic. Eyes:   PEERL, EOMI. No icterus. Conjunctiva pink. Ears:  Normal auditory acuity. Neck:  Supple Throat: Oral cavity and pharynx without inflammation, swelling or lesion.  Respiratory: Respirations even and unlabored. Lungs clear to auscultation bilaterally.   No wheezes, crackles, or rhonchi.  Cardiovascular: Normal S1, S2. No MRG. Regular rate and rhythm. No peripheral edema, cyanosis or pallor.  Gastrointestinal:  Tense, marked ttp with involuntary guarding, no distension, Normal bowel sounds. No appreciable masses or hepatomegaly. Rectal:  Not performed.  Msk:  Symmetrical without gross deformities. Without edema, no deformity or joint abnormality.  Neurologic:  Alert and  oriented x4;  grossly normal neurologically.  Skin:   Dry and intact without significant lesions or rashes. Psychiatric: Demonstrates good judgement and reason. Tearful  RELEVANT LABS AND IMAGING: CBC    Component Value Date/Time   WBC 5.5 12/11/2016 0942   WBC 7.0 11/20/2016 0002   RBC 2.54 (L) 12/11/2016 0942   RBC 2.82 (L) 11/20/2016 0002   HGB 9.1 (L) 12/11/2016 0942   HCT 28.2 (L) 12/11/2016 0942   PLT 279 12/11/2016 0942   MCV 111.0 (H) 12/11/2016 0942   MCH 35.8 (H) 12/11/2016 0942   MCH 36.2 (H) 11/20/2016 0002   MCHC 32.3 12/11/2016 0942   MCHC 33.3 11/20/2016  0002   RDW 16.0 (H) 12/11/2016 0942   LYMPHSABS 0.6 (L) 12/11/2016 0942   MONOABS 1.3 (H) 12/11/2016 0942   EOSABS 0.1 12/11/2016 0942   BASOSABS 0.0 12/11/2016 0942    CMP     Component Value Date/Time   NA 142 12/11/2016 0942   K 3.9 12/11/2016 0942   CL 103 11/19/2016 2125   CO2 30 (H) 12/11/2016 0942   GLUCOSE 80 12/11/2016 0942   BUN 10.5 12/11/2016 0942   CREATININE 1.2 12/11/2016 0942   CALCIUM 9.6 12/11/2016 0942   PROT 7.4 12/11/2016 0942   ALBUMIN 3.1 (L) 12/11/2016 0942   AST 33 12/11/2016 0942   ALT 24 12/11/2016 0942   ALKPHOS 104 12/11/2016 0942   BILITOT 0.29 12/11/2016 0942   GFRNONAA >60 11/19/2016 2125   GFRAA >60 11/19/2016 2125   EXAM: CT ABDOMEN AND PELVIS WITH CONTRAST 11/19/16  TECHNIQUE: Multidetector CT imaging of the abdomen and pelvis was performed using the standard protocol following bolus administration of intravenous contrast.  CONTRAST:  178mL ISOVUE-300 IOPAMIDOL (ISOVUE-300) INJECTION 61%  COMPARISON:  CT abdomen pelvis 11/04/2016  FINDINGS: Lower chest: Unchanged appearance of left lower lobe adenocarcinoma. No pleural effusion.  Hepatobiliary: Normal hepatic contours and density. Possible geographic fatty infiltration along the course of the falciform ligament, or transient hepatic attenuation difference. No visible biliary dilatation. Normal gallbladder.  Pancreas: Normal contours without ductal dilatation. No peripancreatic fluid collection.  Spleen: Normal.  Adrenals/Urinary Tract:  --Adrenal glands: Normal.  --Right kidney/ureter: No hydronephrosis or perinephric stranding. No nephrolithiasis. No obstructing ureteral stones.  --Left kidney/ureter: No hydronephrosis or perinephric stranding. No nephrolithiasis. No obstructing ureteral stones.  --Urinary bladder: Unremarkable.  Stomach/Bowel:  --Stomach/Duodenum: No hiatal hernia or other gastric abnormality. Normal duodenal course and  caliber.  --Small bowel: No dilatation or inflammation.  --Colon: No focal abnormality.  --Appendix: Normal.  Vascular/Lymphatic: Atherosclerotic calcification is present within the non-aneurysmal abdominal aorta, without hemodynamically significant stenosis. No abdominal or pelvic lymphadenopathy.  Reproductive: No free fluid in the pelvis.  Musculoskeletal. Sclerotic lesion at the inferior endplate of L2 is unchanged. Well-circumscribed lytic lesion at the inferior endplate of L3 is also unchanged. Generalized  sclerosis at T12 is also unchanged.  Other: None.  IMPRESSION: 1. No acute abdominal or pelvic abnormality. 2. Unchanged appearance of left lower lobe adenocarcinoma with lepidic features. 3. Unchanged L3 lytic lesion and T12 and L2 sclerotic lesions. 4.  Aortic Atherosclerosis (ICD10-I70.0).   Electronically Signed   By: Ulyses Jarred M.D.   On: 11/19/2016 22:49  EXAM: ULTRASOUND ABDOMEN LIMITED RIGHT UPPER QUADRANT 11/20/16  COMPARISON:  Abdominal CT 2 hours prior.  FINDINGS: Gallbladder:  Bowel gas partially obscures evaluation. Physiologically distended. No gallstones or wall thickening visualized. No sonographic Murphy sign noted by sonographer.  Common bile duct:  Diameter: 3 mm.  Liver:  Mild heterogeneous parenchymal echogenicity. No evidence of focal lesion. Portal vein is patent on color Doppler imaging with normal direction of blood flow towards the liver.  IMPRESSION: No explanation for right upper quadrant pain. Normal sonographic appearance of the gallbladder.   Electronically Signed   By: Jeb Levering M.D.   On: 11/20/2016 01:29     Assessment: 1.  Nausea and vomiting: Worse over the past 72 hours, patient is unable to keep liquids or solids or medications down; likely related to below, but concerned regarding possible dehydration and electrolyte abnormality 2.  Epigastric pain: Somewhat chronic over  the past 7 months since starting chemotherapy, likely related, worse over the past 72 hours; consider gastritis versus PUD versus other 3.  GERD: Increased over the past month irregardless of Omeprazole 20 mg daily; consider relation to H. pylori versus PUD versus other 4.  Hematemesis: Patient describes "a few episodes" over the past few months with vomiting, most recently this morning but this was "an increased amount"; most likely Mallory-Weiss tear but cannot rule out to gastritis versus other  Plan: 1.  After discussion with the patient, it appears he is in worse pain and condition than he has been previously been when seen in the ER.  He has been unable to keep down any fluids or medicine over the past 72 hours.  He is so tender to touch that I am unable to palpate his abdomen for a thorough exam. 2.  Increased patient's Omeprazole to 40 mg twice daily.  #60 with 3 refills 3.  Recommend the patient schedule his Zofran 4 mg, 2 tabs p.o. every 4 hours for nausea.  Prescribed #100 4.  Patient will need EGD for further workup of this now chronic epigastric pain which has significantly worsened over the past 72 hours.  If patient is not admitted to the hospital today, we will arrange for this outpatient. Otherwise, would recommend proceeding during admission. 5.  Patient to follow in clinic with me after recommendations from ER above and/or after EGD.  Patient was assigned to [name] today.  Ellouise Newer, PA-C [facility] 12/16/2016, 10:05 AM  Cc: Curt Bears, MD

## 2016-12-16 NOTE — ED Notes (Signed)
ED TO INPATIENT HANDOFF REPORT  Name/Age/Gender [name] 56 y.o. male  Code Status Code Status History    This patient does not have a recorded code status. Please follow your organizational policy for patients in this situation.      Home/SNF/Other Home  Chief Complaint cancer pt / abd pain   Level of Care/Admitting Diagnosis ED Disposition    ED Disposition Condition Comment   Admit  Hospital Area: [facility] [[id number]]  Level of Care: Telemetry [5]  Admit to tele based on following criteria: Complex arrhythmia (Bradycardia/Tachycardia)  Diagnosis: Hematemesis [578.0.ICD-9-CM]  Admitting Physician: Charlynne Cousins [[id number]]  Attending Physician: Charlynne Cousins [[id number]]  Estimated length of stay: past midnight tomorrow  Certification:: I certify this patient will need inpatient services for at least 2 midnights  PT Class (Do Not Modify): Inpatient [101]  PT Acc Code (Do Not Modify): Private [1]       Medical History Past Medical History:  Diagnosis Date  . Abdominal pain 06/04/2016  . Adenocarcinoma of left lung, stage 4 ([place]) 05/02/2016  . Bronchitis due to tobacco use ([place])   . Cancer ([place])    Lung  . Dehydration 06/04/2016  . Encounter for antineoplastic chemotherapy 05/02/2016  . Goals of care, counseling/discussion 05/02/2016  . HTN (hypertension) 10/30/2016  . Recurrent upper respiratory infection (URI)   . Seizures ([place]) 05/2011   new onset  . Shortness of breath     Allergies No Known Allergies  IV Location/Drains/Wounds Patient Lines/Drains/Airways Status   Active Line/Drains/Airways    Name:   Placement date:   Placement time:   Site:   Days:   Implanted Port 05/13/16 Right Chest   05/13/16    -    Chest   217          Labs/Imaging Results for orders placed or performed during the hospital encounter of 12/16/16 (from the past 48 hour(s))  Lipase, blood     Status: None   Collection Time: 12/16/16 11:38 AM   Result Value Ref Range   Lipase 20 11 - 51 U/L  Comprehensive metabolic panel     Status: Abnormal   Collection Time: 12/16/16 11:38 AM  Result Value Ref Range   Sodium 138 135 - 145 mmol/L   Potassium 3.8 3.5 - 5.1 mmol/L   Chloride 101 101 - 111 mmol/L   CO2 26 22 - 32 mmol/L   Glucose, Bld 102 (H) 65 - 99 mg/dL   [name] 23 (H) 6 - 20 mg/dL   Creatinine, Ser 1.61 (H) 0.61 - 1.24 mg/dL   Calcium 9.4 8.9 - 10.3 mg/dL   Total Protein 8.0 6.5 - 8.1 g/dL   Albumin 3.6 3.5 - 5.0 g/dL   AST 51 (H) 15 - 41 U/L   ALT 34 17 - 63 U/L   Alkaline Phosphatase 90 38 - 126 U/L   Total Bilirubin 0.9 0.3 - 1.2 mg/dL   GFR calc non Af Amer 46 (L) >60 mL/min   GFR calc Af Amer 53 (L) >60 mL/min    Comment: (NOTE) The eGFR has been calculated using the CKD EPI equation. This calculation has not been validated in all clinical situations. eGFR's persistently <60 mL/min signify possible Chronic Kidney Disease.    Anion gap 11 5 - 15  CBC     Status: Abnormal   Collection Time: 12/16/16 11:38 AM  Result Value Ref Range   WBC 8.4 4.0 - 10.5 K/uL   RBC  2.62 (L) 4.22 - 5.81 MIL/uL   Hemoglobin 9.2 (L) 13.0 - 17.0 g/dL   HCT 27.9 (L) 39.0 - 52.0 %   MCV 106.5 (H) 78.0 - 100.0 fL   MCH 35.1 (H) 26.0 - 34.0 pg   MCHC 33.0 30.0 - 36.0 g/dL   RDW 15.3 11.5 - 15.5 %   Platelets 252 150 - 400 K/uL  I-Stat CG4 Lactic Acid, ED     Status: None   Collection Time: 12/16/16 11:55 AM  Result Value Ref Range   Lactic Acid, Venous 0.95 0.5 - 1.9 mmol/L  Urinalysis, Routine w reflex microscopic     Status: Abnormal   Collection Time: 12/16/16  2:44 PM  Result Value Ref Range   Color, Urine YELLOW YELLOW   APPearance CLEAR CLEAR   Specific Gravity, Urine 1.016 1.005 - 1.030   pH 7.0 5.0 - 8.0   Glucose, UA NEGATIVE NEGATIVE mg/dL   Hgb urine dipstick NEGATIVE NEGATIVE   Bilirubin Urine NEGATIVE NEGATIVE   Ketones, ur 5 (A) NEGATIVE mg/dL   Protein, ur NEGATIVE NEGATIVE mg/dL   Nitrite NEGATIVE NEGATIVE    Leukocytes, UA NEGATIVE NEGATIVE   Dg Chest 2 View  Result Date: 12/16/2016 CLINICAL DATA:  Short of breath.  Nausea. EXAM: CHEST  2 VIEW COMPARISON:  CT 11/27/2016 FINDINGS: Prior port in the RIGHT chest wall. Tip in distal SVC. Mild central interstitial thickening. No focal consolidation. No pneumothorax. Small LEFT effusion. LEFT suprahilar mass noted in and compares to CT 11/27/2016 IMPRESSION: 1. No acute cardiopulmonary findings. 2. Persists interstitial thickening centrally. 3. Small LEFT effusion. 4. Stable LEFT upper lobe mass. Electronically Signed   By: Suzy Bouchard M.D.   On: 12/16/2016 12:27   Ct Abdomen Pelvis W Contrast  Result Date: 12/16/2016 CLINICAL DATA:  Abdominal pain with fever and abdominal distention. Stage IV adenocarcinoma of the lung. EXAM: CT ABDOMEN AND PELVIS WITH CONTRAST TECHNIQUE: Multidetector CT imaging of the abdomen and pelvis was performed using the standard protocol following bolus administration of intravenous contrast. CONTRAST:  100 cc Isovue-300 COMPARISON:  09/19/2016 FINDINGS: Lower chest:  Stable interstitial thickening left base. Hepatobiliary: No focal abnormality within the liver parenchyma. There is no evidence for gallstones, gallbladder wall thickening, or pericholecystic fluid. No intrahepatic or extrahepatic biliary dilation. Pancreas: No focal mass lesion. No dilatation of the main duct. No intraparenchymal cyst. No peripancreatic edema. Spleen: No splenomegaly. No focal mass lesion. Adrenals/Urinary Tract: No adrenal nodule or mass. Kidneys unremarkable No evidence for hydroureter. The urinary bladder appears normal for the degree of distention. Stomach/Bowel: Circumferential wall thickening noted distal esophagus. There appears to be some free fluid around the distal esophagus and esophagogastric junction. Stomach otherwise unremarkable. Duodenum is normally positioned as is the ligament of Treitz. No evidence for small bowel obstruction.  Appendix is not well seen but is probably visible on coronal images. No edema or inflammatory change in the pericecal region. No colonic dilatation. No findings to suggest colonic diverticulitis. Vascular/Lymphatic: There is abdominal aortic atherosclerosis without aneurysm. There is no gastrohepatic or hepatoduodenal ligament lymphadenopathy. No intraperitoneal or retroperitoneal lymphadenopathy. No pelvic sidewall lymphadenopathy. Reproductive: The prostate gland and seminal vesicles have normal imaging features. Other: No intraperitoneal free fluid. No evidence for intraperitoneal free air. No pneumomediastinum visible in the lower chest. Musculoskeletal: Sclerotic lesion previously described in L2 is similar. Lucent lesion in L3 is unchanged. Diffuse sclerosis of the T12 vertebral body is similar to prior. IMPRESSION: 1. Circumferential wall thickening in the distal esophagus  tracking into the esophagogastric junction. There is some mild adjacent edema but no extraluminal gas. Esophagitis or distal esophageal neoplasm could have this appearance. 2. No evidence for bowel obstruction. 3. No evidence for colonic diverticulitis. 4.  Aortic Atherosclerois (ICD10-170.0) 5. Similar appearance sclerotic lesion in L2 with diffuse sclerosis of the T12 vertebral body. No change lucent lesion in the L3 body. Electronically Signed   By: Misty Stanley M.D.   On: 12/16/2016 14:54    Pending Labs Unresulted Labs (From admission, onward)   None      Vitals/Pain Today's Vitals   12/16/16 1432 12/16/16 1500 12/16/16 1530 12/16/16 1600  BP: (!) 186/101 (!) 144/88 (!) 150/85 134/74  Pulse: (!) 102 (!) 106 100 97  Resp: (!) 27 (!) 30 (!) 26 (!) 26  Temp:      TempSrc:      SpO2: 95% 97% 95% 94%  Weight:      Height:      PainSc:        Isolation Precautions No active isolations  Medications Medications  sodium chloride 0.9 % bolus 1,000 mL (not administered)  0.9 %  sodium chloride infusion (not  administered)  pantoprazole (PROTONIX) injection 40 mg (not administered)  sodium chloride 0.9 % bolus 1,000 mL (not administered)  albuterol (PROVENTIL) (2.5 MG/3ML) 0.083% nebulizer solution 5 mg (5 mg Nebulization Given 12/16/16 1122)  iopamidol (ISOVUE-300) 61 % injection 100 mL (100 mLs Intravenous Contrast Given 12/16/16 1357)  sodium chloride 0.9 % bolus 1,000 mL (1,000 mLs Intravenous New Bag/Given 12/16/16 1525)  morphine 4 MG/ML injection 4 mg (4 mg Intravenous Given 12/16/16 1554)  ondansetron (ZOFRAN) injection 4 mg (4 mg Intravenous Given 12/16/16 1554)    Mobility walks

## 2016-12-16 NOTE — ED Notes (Signed)
Patient just brought back to room from triage. Respiratory waiting for patient to begin breathing treatment.

## 2016-12-16 NOTE — ED Notes (Signed)
Patient transported to CT 

## 2016-12-17 ENCOUNTER — Inpatient Hospital Stay (HOSPITAL_COMMUNITY): Payer: Medicaid Other | Admitting: Certified Registered Nurse Anesthetist

## 2016-12-17 ENCOUNTER — Encounter (HOSPITAL_COMMUNITY)
Admission: EM | Disposition: A | Discharge status: Home / Self Care | Payer: Self-pay | Source: Home / Self Care | Attending: Internal Medicine

## 2016-12-17 ENCOUNTER — Inpatient Hospital Stay (HOSPITAL_COMMUNITY): Payer: Medicaid Other

## 2016-12-17 ENCOUNTER — Encounter (HOSPITAL_COMMUNITY): Payer: Self-pay

## 2016-12-17 DIAGNOSIS — D539 Nutritional anemia, unspecified: Secondary | ICD-10-CM

## 2016-12-17 DIAGNOSIS — R1084 Generalized abdominal pain: Secondary | ICD-10-CM

## 2016-12-17 HISTORY — PX: ESOPHAGOGASTRODUODENOSCOPY (EGD) WITH PROPOFOL: SHX5813

## 2016-12-17 LAB — BASIC METABOLIC PANEL
ANION GAP: 6 (ref 5–15)
BUN: 18 mg/dL (ref 6–20)
CHLORIDE: 110 mmol/L (ref 101–111)
CO2: 26 mmol/L (ref 22–32)
Calcium: 8.8 mg/dL — ABNORMAL LOW (ref 8.9–10.3)
Creatinine, Ser: 1.35 mg/dL — ABNORMAL HIGH (ref 0.61–1.24)
GFR calc Af Amer: >60 mL/min (ref >60)
GFR calc non Af Amer: 57 mL/min — ABNORMAL LOW (ref >60)
Glucose, Bld: 75 mg/dL (ref 65–99)
POTASSIUM: 4 mmol/L (ref 3.5–5.1)
SODIUM: 142 mmol/L (ref 135–145)

## 2016-12-17 LAB — CBC
HCT: 24 % — ABNORMAL LOW (ref 39.0–52.0)
Hemoglobin: 7.9 g/dL — ABNORMAL LOW (ref 13.0–17.0)
MCH: 35.9 pg — ABNORMAL HIGH (ref 26.0–34.0)
MCHC: 32.9 g/dL (ref 30.0–36.0)
MCV: 109.1 fL — AB (ref 78.0–100.0)
PLATELETS: 162 10*3/uL (ref 150–400)
RBC: 2.2 MIL/uL — AB (ref 4.22–5.81)
RDW: 15.6 % — AB (ref 11.5–15.5)
WBC: 4.3 10*3/uL (ref 4.0–10.5)

## 2016-12-17 LAB — HIV ANTIBODY (ROUTINE TESTING W REFLEX): HIV SCREEN 4TH GENERATION: NONREACTIVE

## 2016-12-17 LAB — HEMOGLOBIN AND HEMATOCRIT, BLOOD
HEMATOCRIT: 24 % — AB (ref 39.0–52.0)
HEMOGLOBIN: 7.8 g/dL — AB (ref 13.0–17.0)

## 2016-12-17 LAB — ABO/RH: ABO/RH(D): O POS

## 2016-12-17 SURGERY — ESOPHAGOGASTRODUODENOSCOPY (EGD) WITH PROPOFOL
Anesthesia: Monitor Anesthesia Care

## 2016-12-17 MED ORDER — PROPOFOL 500 MG/50ML IV EMUL
INTRAVENOUS | Status: DC | PRN
  Administered 2016-12-17: 125 ug/kg/min via INTRAVENOUS

## 2016-12-17 MED ORDER — SODIUM CHLORIDE 0.9 % IV SOLN
INTRAVENOUS | Status: AC
  Administered 2016-12-17 (×2): via INTRAVENOUS

## 2016-12-17 MED ORDER — PROPOFOL 10 MG/ML IV BOLUS
INTRAVENOUS | Status: DC | PRN
  Administered 2016-12-17: 30 mg via INTRAVENOUS
  Administered 2016-12-17: 40 mg via INTRAVENOUS
  Administered 2016-12-17: 30 mg via INTRAVENOUS

## 2016-12-17 MED ORDER — PROPOFOL 10 MG/ML IV BOLUS
INTRAVENOUS | Status: AC
  Filled 2016-12-17: qty 40

## 2016-12-17 MED ORDER — LIDOCAINE 2% (20 MG/ML) 5 ML SYRINGE
INTRAMUSCULAR | Status: DC | PRN
  Administered 2016-12-17: 50 mg via INTRAVENOUS

## 2016-12-17 MED ORDER — LIDOCAINE 2% (20 MG/ML) 5 ML SYRINGE
INTRAMUSCULAR | Status: AC
  Filled 2016-12-17: qty 5

## 2016-12-17 MED ORDER — ONDANSETRON HCL 4 MG/2ML IJ SOLN
INTRAMUSCULAR | Status: AC
  Filled 2016-12-17: qty 2

## 2016-12-17 SURGICAL SUPPLY — 15 items
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
## 2016-12-17 NOTE — Interval H&P Note (Signed)
History and Physical Interval Note:  12/17/2016 11:31 AM  [name]  has presented today for surgery, with the diagnosis of hematemesis  The various methods of treatment have been discussed with the patient and family. After consideration of risks, benefits and other options for treatment, the patient has consented to  Procedure(s): ESOPHAGOGASTRODUODENOSCOPY (EGD) WITH PROPOFOL (N/A) as a surgical intervention .  The patient's history has been reviewed, patient examined, no change in status, stable for surgery.  I have reviewed the patient's chart and labs.  Questions were answered to the patient's satisfaction.     Pricilla Riffle. Fuller Plan

## 2016-12-17 NOTE — Op Note (Signed)
[facility] Patient Name: [name] Procedure Date: 12/17/2016 MRN: [id number] Attending MD: Ladene Artist , MD Date of Birth: 1960/07/10 CSN: [id number] Age: 56 Admit Type: Inpatient Procedure:                Upper GI endoscopy Indications:              Hematemesis, Nausea with vomiting Providers:                Pricilla Riffle. Fuller Plan, MD, Cleda Daub, RN, William Dalton, Technician Referring MD:             [facility] Medicines:                Monitored Anesthesia Care Complications:            No immediate complications. Estimated Blood Loss:     Estimated blood loss was minimal. Procedure:                Pre-Anesthesia Assessment:                           - Prior to the procedure, a History and Physical                            was performed, and patient medications and                            allergies were reviewed. The patient's tolerance of                            previous anesthesia was also reviewed. The risks                            and benefits of the procedure and the sedation                            options and risks were discussed with the patient.                            All questions were answered, and informed consent                            was obtained. Prior Anticoagulants: The patient has                            taken no previous anticoagulant or antiplatelet                            agents. ASA Grade Assessment: III - A patient with                            severe systemic disease. After reviewing the risks  and benefits, the patient was deemed in                            satisfactory condition to undergo the procedure.                           After obtaining informed consent, the endoscope was                            passed under direct vision. Throughout the                            procedure, the patient's blood pressure, pulse, and                   oxygen saturations were monitored continuously. The                            [id number] ([id number]) scope was introduced through the                            mouth, and advanced to the second part of duodenum.                            The upper GI endoscopy was accomplished without                            difficulty. The patient tolerated the procedure                            well. Scope In: Scope Out: Findings:      The examined esophagus was normal.      Multiple localized, medium non-bleeding erosions were found in the       gastric antrum, in the prepyloric region of the stomach and at the       pylorus. There were no stigmata of recent bleeding. Biopsies were taken       with a cold forceps for histology.      Diffuse mild inflammation characterized by congestion (edema),       friability and granularity was found in the gastric body. Biopsies were       taken with a cold forceps for histology.      The exam of the stomach was otherwise normal.      The duodenal bulb and second portion of the duodenum were normal. Impression:               - Normal esophagus.                           - Non-bleeding erosive gastropathy. Biopsied.                           - Gastritis. Biopsied.                           - Normal duodenal bulb and second portion of the  duodenum. Moderate Sedation:      N/A- Per Anesthesia Care Recommendation:           - Full liquid diet and advance to soft diet as                            tolerated.                           - Continue present medications.                           - No aspirin, ibuprofen, naproxen, or other                            non-steroidal anti-inflammatory drugs long term .                           - Await pathology results.                           - Continue pantoprazole 40 mg po bid and Zofran 4                            mg po qid prn                           - OK for  discharge from GI standpoint.                           - Outpatient GI follow with [name] in 1 month Procedure Code(s):        --- Professional ---                           [phone], Esophagogastroduodenoscopy, flexible,                            transoral; with biopsy, single or multiple Diagnosis Code(s):        --- Professional ---                           K31.89, Other diseases of stomach and duodenum                           K29.70, Gastritis, unspecified, without bleeding                           K92.0, Hematemesis                           R11.2, Nausea with vomiting, unspecified CPT copyright 2016 American Medical Association. All rights reserved. The codes documented in this report are preliminary and upon coder review may  be revised to meet current compliance requirements. Ladene Artist, MD 12/17/2016 12:29:12 PM This report has been signed electronically. Number of Addenda: 0

## 2016-12-17 NOTE — Progress Notes (Signed)
Patient refused H/H lab draw even after explaining the MD ordered it to make sure his hgb is not dropping some more.
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
## 2016-12-17 NOTE — Anesthesia Preprocedure Evaluation (Addendum)
Anesthesia Evaluation  Patient identified by MRN, date of birth, ID band Patient awake    Reviewed: Allergy & Precautions, NPO status , Patient's Chart, lab work & pertinent test results  Airway Mallampati: II  TM Distance: >3 FB Neck ROM: Full    Dental  (+) Dental Advisory Given, Poor Dentition, Missing   Pulmonary shortness of breath, COPD,  COPD inhaler, Recent URI , Current Smoker,   metastatic adenocarcinoma of the lung undergoing chemotherapy on his fourth cycle (started 7 months ago) with ongoing tobacco abuse   [address] [place]   + decreased breath sounds      Cardiovascular hypertension, Pt. on medications  Rhythm:Regular Rate:Tachycardia     Neuro/Psych Seizures -, Well Controlled,     GI/Hepatic GERD  Medicated,(+)     substance abuse  alcohol use, hematemesis   Endo/Other  negative endocrine ROS  Renal/GU Renal InsufficiencyRenal disease     Musculoskeletal negative musculoskeletal ROS (+)   Abdominal   Peds  Hematology  (+) Blood dyscrasia, anemia ,   Anesthesia Other Findings Day of surgery medications reviewed with the patient.  Reproductive/Obstetrics                           Anesthesia Physical Anesthesia Plan  ASA: IV  Anesthesia Plan: MAC   Post-op Pain Management:    Induction: Intravenous  PONV Risk Score and Plan: 0 and Propofol infusion  Airway Management Planned: Nasal Cannula  Additional Equipment:   Intra-op Plan:   Post-operative Plan:   Informed Consent: I have reviewed the patients History and Physical, chart, labs and discussed the procedure including the risks, benefits and alternatives for the proposed anesthesia with the patient or authorized representative who has indicated his/her understanding and acceptance.   Dental advisory given  Plan Discussed with: CRNA and Anesthesiologist  Anesthesia Plan Comments: (Discussed risks/benefits/alternatives  to MAC sedation including need for ventilatory support, hypotension, need for conversion to general anesthesia.  All patient questions answered.  Patient/guardian wishes to proceed.)        Anesthesia Quick Evaluation
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
## 2016-12-17 NOTE — Transfer of Care (Signed)
Immediate Anesthesia Transfer of Care Note  Patient: [name]  Procedure(s) Performed: ESOPHAGOGASTRODUODENOSCOPY (EGD) WITH PROPOFOL (N/A )  Patient Location: PACU and Endoscopy Unit  Anesthesia Type:MAC  Level of Consciousness: awake and patient cooperative  Airway & Oxygen Therapy: Patient Spontanous Breathing and Patient connected to nasal cannula oxygen  Post-op Assessment: Report given to RN and Post -op Vital signs reviewed and stable  Post vital signs: Reviewed and stable  Last Vitals:  Vitals:   12/17/16 0558 12/17/16 1033  BP: (!) 146/81 113/63  Pulse: 97 (!) 104  Resp: 17 12  Temp: 37.2 C 37.8 C  SpO2: 100% 93%    Last Pain:  Vitals:   12/17/16 1033  TempSrc: Oral  PainSc:       Patients Stated Pain Goal: 4 (20/23/34 3568)  Complications: No apparent anesthesia complications

## 2016-12-18 ENCOUNTER — Inpatient Hospital Stay (HOSPITAL_COMMUNITY): Payer: Medicaid Other

## 2016-12-18 DIAGNOSIS — K3189 Other diseases of stomach and duodenum: Secondary | ICD-10-CM | POA: Diagnosis present

## 2016-12-18 DIAGNOSIS — C3492 Malignant neoplasm of unspecified part of left bronchus or lung: Secondary | ICD-10-CM

## 2016-12-18 DIAGNOSIS — K2970 Gastritis, unspecified, without bleeding: Secondary | ICD-10-CM | POA: Diagnosis present

## 2016-12-18 DIAGNOSIS — K920 Hematemesis: Secondary | ICD-10-CM

## 2016-12-18 DIAGNOSIS — I10 Essential (primary) hypertension: Secondary | ICD-10-CM

## 2016-12-18 DIAGNOSIS — J449 Chronic obstructive pulmonary disease, unspecified: Secondary | ICD-10-CM

## 2016-12-18 DIAGNOSIS — E860 Dehydration: Secondary | ICD-10-CM

## 2016-12-18 LAB — BASIC METABOLIC PANEL
ANION GAP: 5 (ref 5–15)
BUN: 11 mg/dL (ref 6–20)
CO2: 25 mmol/L (ref 22–32)
Calcium: 8.5 mg/dL — ABNORMAL LOW (ref 8.9–10.3)
Chloride: 109 mmol/L (ref 101–111)
Creatinine, Ser: 1.31 mg/dL — ABNORMAL HIGH (ref 0.61–1.24)
GFR calc Af Amer: >60 mL/min (ref >60)
GFR, EST NON AFRICAN AMERICAN: 59 mL/min — AB (ref >60)
GLUCOSE: 98 mg/dL (ref 65–99)
POTASSIUM: 3.5 mmol/L (ref 3.5–5.1)
Sodium: 139 mmol/L (ref 135–145)

## 2016-12-18 LAB — BLOOD GAS, ARTERIAL
ACID-BASE EXCESS: 0.4 mmol/L (ref 0.0–2.0)
Bicarbonate: 24.6 mmol/L (ref 20.0–28.0)
DRAWN BY: 270211
FIO2: 0.21
O2 SAT: 78.2 %
PATIENT TEMPERATURE: 37
PCO2 ART: 40.5 mmHg (ref 32.0–48.0)
pH, Arterial: 7.401 (ref 7.350–7.450)
pO2, Arterial: 46.7 mmHg — ABNORMAL LOW (ref 83.0–108.0)

## 2016-12-18 LAB — MAGNESIUM: Magnesium: 1.7 mg/dL (ref 1.7–2.4)

## 2016-12-18 LAB — HEMOGLOBIN AND HEMATOCRIT, BLOOD
HCT: 24 % — ABNORMAL LOW (ref 39.0–52.0)
HEMATOCRIT: 21.3 % — AB (ref 39.0–52.0)
HEMOGLOBIN: 7.9 g/dL — AB (ref 13.0–17.0)
Hemoglobin: 7.2 g/dL — ABNORMAL LOW (ref 13.0–17.0)

## 2016-12-18 MED ORDER — BUDESONIDE 0.5 MG/2ML IN SUSP
0.5000 mg | Freq: Two times a day (BID) | RESPIRATORY_TRACT | Status: DC
  Administered 2016-12-18 – 2016-12-24 (×10): 0.5 mg via RESPIRATORY_TRACT
  Filled 2016-12-18 (×12): qty 2

## 2016-12-18 MED ORDER — ONDANSETRON 4 MG PO TBDP: 4.0000 mg | ORAL_TABLET | Freq: Three times a day (TID) | ORAL | 0 refills | Status: DC | PRN

## 2016-12-18 MED ORDER — POTASSIUM CHLORIDE CRYS ER 20 MEQ PO TBCR
40.0000 meq | EXTENDED_RELEASE_TABLET | Freq: Once | ORAL | Status: AC
Start: 2016-12-18 — End: 2016-12-18
  Administered 2016-12-18: 40 meq via ORAL
  Filled 2016-12-18: qty 2

## 2016-12-18 MED ORDER — DEXTROSE 5 % IV SOLN
1.0000 g | Freq: Three times a day (TID) | INTRAVENOUS | Status: DC
  Administered 2016-12-18 – 2016-12-21 (×8): 1 g via INTRAVENOUS
  Filled 2016-12-18 (×9): qty 1

## 2016-12-18 MED ORDER — LORATADINE 10 MG PO TABS
10.0000 mg | ORAL_TABLET | Freq: Every day | ORAL | Status: DC
  Administered 2016-12-18 – 2016-12-23 (×6): 10 mg via ORAL
  Filled 2016-12-18 (×7): qty 1

## 2016-12-18 MED ORDER — GUAIFENESIN ER 600 MG PO TB12
1200.0000 mg | ORAL_TABLET | Freq: Two times a day (BID) | ORAL | Status: DC
  Administered 2016-12-18 – 2016-12-23 (×11): 1200 mg via ORAL
  Filled 2016-12-18 (×11): qty 2

## 2016-12-18 MED ORDER — PANTOPRAZOLE SODIUM 40 MG PO TBEC
40.0000 mg | DELAYED_RELEASE_TABLET | Freq: Two times a day (BID) | ORAL | Status: DC
  Administered 2016-12-18 – 2016-12-24 (×12): 40 mg via ORAL
  Filled 2016-12-18 (×13): qty 1

## 2016-12-18 MED ORDER — BUDESONIDE-FORMOTEROL FUMARATE 160-4.5 MCG/ACT IN AERO: 2.0000 | INHALATION_SPRAY | Freq: Two times a day (BID) | RESPIRATORY_TRACT | 2 refills | Status: DC

## 2016-12-18 MED ORDER — METHYLPREDNISOLONE SODIUM SUCC 125 MG IJ SOLR
60.0000 mg | Freq: Once | INTRAMUSCULAR | Status: AC
  Administered 2016-12-18: 60 mg via INTRAVENOUS
  Filled 2016-12-18: qty 2

## 2016-12-18 MED ORDER — MAGNESIUM SULFATE 4 GM/100ML IV SOLN
4.0000 g | Freq: Once | INTRAVENOUS | Status: AC
  Administered 2016-12-18: 4 g via INTRAVENOUS
  Filled 2016-12-18: qty 100

## 2016-12-18 MED ORDER — PANTOPRAZOLE SODIUM 40 MG PO TBEC: 40.0000 mg | DELAYED_RELEASE_TABLET | Freq: Two times a day (BID) | ORAL | 1 refills | Status: AC

## 2016-12-18 MED ORDER — VANCOMYCIN HCL IN DEXTROSE 1-5 GM/200ML-% IV SOLN
1000.0000 mg | Freq: Every day | INTRAVENOUS | Status: DC
  Administered 2016-12-18 – 2016-12-19 (×2): 1000 mg via INTRAVENOUS
  Filled 2016-12-18 (×2): qty 200

## 2016-12-18 MED ORDER — IPRATROPIUM-ALBUTEROL 0.5-2.5 (3) MG/3ML IN SOLN
3.0000 mL | Freq: Four times a day (QID) | RESPIRATORY_TRACT | Status: DC
  Administered 2016-12-18 – 2016-12-19 (×5): 3 mL via RESPIRATORY_TRACT
  Filled 2016-12-18 (×5): qty 3

## 2016-12-18 MED ORDER — ARFORMOTEROL TARTRATE 15 MCG/2ML IN NEBU
15.0000 ug | INHALATION_SOLUTION | Freq: Two times a day (BID) | RESPIRATORY_TRACT | Status: DC
  Administered 2016-12-18 – 2016-12-24 (×10): 15 ug via RESPIRATORY_TRACT
  Filled 2016-12-18 (×12): qty 2
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
## 2016-12-18 NOTE — Progress Notes (Signed)
Pt called nurse into room and was very SOB.  Stated he couldn't breathe. Respiratory had just drawn ABG and Oxygen sats were in the 70's.Respiratory  paged for treatment.  Started on oxygen at 2L, and sat rose to 95% and respirations eased.  Will continue to monitor

## 2016-12-18 NOTE — Progress Notes (Signed)
Pharmacy Antibiotic Note  [name] is a 56 y.o. male with PMH advanced lung cancer on chemo admitted on 12/16/2016 with hematemesis and abd pain. Just before discharge patient was noted to feel worse and CXR ordered which revealed possible developing PNA. Pharmacy has been consulted for vancomycin dosing; cefepime per MD.  Plan:  Vancomycin 1000 mg IV q24 hr (est AUC 470 based on SCr 1.31)  Measure vancomycin AUC at steady state as indicated  Cefepime 1g IV q8 hr per MD, dosing appropriate Follow clinical course, renal function, culture results as available Follow for de-escalation of antibiotics and LOT   Height: 5\' 11"  (180.3 cm) Weight: 141 lb 1.5 oz (64 kg) IBW/kg (Calculated) : 75.3  Temp (24hrs), Avg:98.8 F (37.1 C), Min:98.5 F (36.9 C), Max:98.9 F (37.2 C)  Recent Labs  Lab 12/16/16 1138 12/16/16 1155 12/17/16 0650 12/18/16 0453  WBC 8.4  --  4.3  --   CREATININE 1.61*  --  1.35* 1.31*  LATICACIDVEN  --  0.95  --   --     Estimated Creatinine Clearance: 56.3 mL/min (A) (by C-G formula based on SCr of 1.31 mg/dL (H)).    No Known Allergies    Thank you for allowing pharmacy to be a part of this patient's care.  Reuel Boom, PharmD, BCPS Pager: [phone] 12/18/2016, 8:31 PM

## 2016-12-18 NOTE — Progress Notes (Signed)
Awaiting 02 sats documentation to confirm if home 02 needed.

## 2016-12-18 NOTE — Clinical Social Work Note (Signed)
Clinical Social Work Assessment  Patient Details  Name: [name] MRN: [id number] Date of Birth: [birth year]  Date of referral:  12/18/16               Reason for consult:  Housing Concerns/Homelessness                Permission sought to share information with:    Permission granted to share information::     Name::        Agency::     Relationship::     Contact Information:     Housing/Transportation Living arrangements for the past 2 months:  Homeless Company secretary) Source of Information:  Patient Patient Interpreter Needed:  None Criminal Activity/Legal Involvement Pertinent to Current Situation/Hospitalization:  No - Comment as needed Significant Relationships:  Siblings, Friend Lives with:  Self, Other (Comment)(Resides at homeless shelter) Do you feel safe going back to the place where you live?  Yes Need for family participation in patient care:  No (Coment)  Care giving concerns:  Patient from [employer]. Patient reported no care giving concerns.    Social Worker assessment / plan:  CSW spoke with patient at bedside regarding consult for housing. Patient reported that he resides at [facility] and that he has been for the past 4 months. Patient reported that he is participating in [facility] and that he has an upcoming appointment on December 18,2018. Patient reported that [facility] will assist him with getting an apartment and that they will pay either patient's 1st month rent or deposit. Patient reports that he receives disability and that he will use that to pay his rent. Patient reported that he believes he is close to getting an apartment and that he will return to [facility] and stay until he gets an apartment. Patient reports that he has a good support system and that his friends and family provide him transportation to his chemo appointments. CSW inquired if patient needed any resources, patient reported that he  needs a bus pass at discharge. CSW provided bus pass to [employer] Secretary for patient at discharge. CSW signing off, no other needs identified.    Employment status:  Unemployed Forensic scientist:  Medicaid In [place] PT Recommendations:  Not assessed at this time Information / Referral to community resources:  Other (Comment Required)(Bus Pass for transportation)  Patient/Family's Response to care:  Patient appreciative of CSW intervention.   Patient/Family's Understanding of and Emotional Response to Diagnosis, Current Treatment, and Prognosis:  Patient presented calm and engaged throughout assessment. Patient verbalized plan to discharge back to shelter and will continue to work on getting his apartment. Patient hopeful that he will get into an apartment soon.    Emotional Assessment Appearance:  Appears stated age Attitude/Demeanor/Rapport:  Other(Cooperative) Affect (typically observed):  Calm Orientation:  Oriented to Self, Oriented to Place, Oriented to  Time, Oriented to Situation Alcohol / Substance use:  Not Applicable Psych involvement (Current and /or in the community):  No (Comment)  Discharge Needs  Concerns to be addressed:  No discharge needs identified Readmission within the last 30 days:  No Current discharge risk:  None Barriers to Discharge:  No Barriers Identified   [name], LCSW 12/18/2016, 11:52 AM

## 2016-12-18 NOTE — Progress Notes (Signed)
RT placed labelled sputum cup in room, RN made aware.

## 2016-12-18 NOTE — Progress Notes (Signed)
Patient was being prepared to be discharged when he told the nurse that he did not feel too well and felt like crap. Nurse noted that patient significantly short of breath on minimal exertion. Will cancel discharge order. Check a chest x-ray. Check ABG. Place on nebulizer treatments scheduled, we'll give a dose of IV Solu-Medrol. Follow.  No charge.

## 2016-12-18 NOTE — Progress Notes (Signed)
Patient in need of bus pass at DC.  LCSW attempted to visit patient. Patient was asleep and did not arise.   LCSW left bus pass on patients chart.   Carolin Coy [place] [facility] [place]

## 2016-12-18 NOTE — Progress Notes (Signed)
Pt oxygen sat was 93% on RA before ambulation.  Pt was markedly SOB on ambulation but sats remained 88-90% on RA

## 2016-12-18 NOTE — Care Management Note (Signed)
Case Management Note  Patient Details  Name: YADIEL AUBRY MRN: [id number] Date of Birth: 01-28-1960  Subjective/Objective: 56 y/o m admitted w/Hematemesis. From home. Has medicaid-has pcp-[facility]-patient to call for pcp appt-patient voiced understanding, pharmacy-[facility] otpt. [facility] [place] fool[facility] up on patient after d/c-community resources. No further CM needs.                   Action/Plan:d/c home.   Expected Discharge Date:  (unknown)               Expected Discharge Plan:  Home/Self Care  In-House Referral:     Discharge planning Services  CM Consult, [facility]  Post Acute Care Choice:    Choice offered to:     DME Arranged:    DME Agency:     HH Arranged:    HH Agency:     Status of Service:  In process, will continue to follow  If discussed at Long Length of Stay Meetings, dates discussed:    Additional Comments:  Dessa Phi, RN 12/18/2016, 12:13 PM

## 2016-12-18 NOTE — Care Management Note (Signed)
Case Management Note  Patient Details  Name: [name] MRN: [id number] Date of Birth: February 27, 1960  Subjective/Objective:  Noted 02 sats. No home 02 orders. CSW following for shelter/bus pass. No CM needs.                  Action/Plan:d/c homeless shelter.   Expected Discharge Date:  12/18/16               Expected Discharge Plan:  Home/Self Care  In-House Referral:     Discharge planning Services  CM Consult, [facility]  Post Acute Care Choice:    Choice offered to:     DME Arranged:    DME Agency:     HH Arranged:    HH Agency:     Status of Service:  Completed, signed off  If discussed at [employer], dates discussed:    Additional Comments:  Dessa Phi, RN 12/18/2016, 3:19 PM

## 2016-12-18 NOTE — Anesthesia Postprocedure Evaluation (Signed)
Anesthesia Post Note  Patient: [name]  Procedure(s) Performed: ESOPHAGOGASTRODUODENOSCOPY (EGD) WITH PROPOFOL (N/A )     Patient location during evaluation: Endoscopy Anesthesia Type: MAC Level of consciousness: awake and alert Pain management: pain level controlled Vital Signs Assessment: post-procedure vital signs reviewed and stable Respiratory status: spontaneous breathing, nonlabored ventilation, respiratory function stable and patient connected to nasal cannula oxygen Cardiovascular status: stable and blood pressure returned to baseline Postop Assessment: no apparent nausea or vomiting Anesthetic complications: no    Last Vitals:  Vitals:   12/17/16 2134 12/18/16 0527  BP:  134/68  Pulse:  95  Resp:  20  Temp:  37.2 C  SpO2: 96% 94%    Last Pain:  Vitals:   12/18/16 0527  TempSrc: Oral  PainSc:                  Catalina Gravel

## 2016-12-19 ENCOUNTER — Inpatient Hospital Stay (HOSPITAL_COMMUNITY): Payer: Medicaid Other

## 2016-12-19 ENCOUNTER — Encounter (HOSPITAL_COMMUNITY): Payer: Self-pay | Admitting: Gastroenterology

## 2016-12-19 DIAGNOSIS — R0602 Shortness of breath: Secondary | ICD-10-CM

## 2016-12-19 DIAGNOSIS — R0600 Dyspnea, unspecified: Secondary | ICD-10-CM

## 2016-12-19 DIAGNOSIS — J189 Pneumonia, unspecified organism: Secondary | ICD-10-CM

## 2016-12-19 LAB — CBC WITH DIFFERENTIAL/PLATELET
BASOS PCT: 0 %
Basophils Absolute: 0 10*3/uL (ref 0.0–0.1)
EOS ABS: 0 10*3/uL (ref 0.0–0.7)
Eosinophils Relative: 0 %
HCT: 21.7 % — ABNORMAL LOW (ref 39.0–52.0)
Hemoglobin: 7.3 g/dL — ABNORMAL LOW (ref 13.0–17.0)
Lymphocytes Relative: 6 %
Lymphs Abs: 0.1 10*3/uL — ABNORMAL LOW (ref 0.7–4.0)
MCH: 35.6 pg — ABNORMAL HIGH (ref 26.0–34.0)
MCHC: 33.6 g/dL (ref 30.0–36.0)
MCV: 105.9 fL — ABNORMAL HIGH (ref 78.0–100.0)
MONO ABS: 0 10*3/uL — AB (ref 0.1–1.0)
MONOS PCT: 2 %
NEUTROS PCT: 93 %
Neutro Abs: 2 10*3/uL (ref 1.7–7.7)
Platelets: 108 10*3/uL — ABNORMAL LOW (ref 150–400)
RBC: 2.05 MIL/uL — ABNORMAL LOW (ref 4.22–5.81)
RDW: 14.8 % (ref 11.5–15.5)
WBC: 2.2 10*3/uL — ABNORMAL LOW (ref 4.0–10.5)

## 2016-12-19 LAB — BASIC METABOLIC PANEL
ANION GAP: 6 (ref 5–15)
BUN: 10 mg/dL (ref 6–20)
CALCIUM: 8.8 mg/dL — AB (ref 8.9–10.3)
CO2: 25 mmol/L (ref 22–32)
CREATININE: 1.27 mg/dL — AB (ref 0.61–1.24)
Chloride: 105 mmol/L (ref 101–111)
Glucose, Bld: 183 mg/dL — ABNORMAL HIGH (ref 65–99)
Potassium: 4.2 mmol/L (ref 3.5–5.1)
SODIUM: 136 mmol/L (ref 135–145)

## 2016-12-19 LAB — STREP PNEUMONIAE URINARY ANTIGEN: STREP PNEUMO URINARY ANTIGEN: NEGATIVE

## 2016-12-19 LAB — MAGNESIUM: MAGNESIUM: 2.3 mg/dL (ref 1.7–2.4)

## 2016-12-19 MED ORDER — IPRATROPIUM-ALBUTEROL 0.5-2.5 (3) MG/3ML IN SOLN
3.0000 mL | Freq: Three times a day (TID) | RESPIRATORY_TRACT | Status: DC
  Administered 2016-12-20 – 2016-12-22 (×6): 3 mL via RESPIRATORY_TRACT
  Filled 2016-12-19 (×8): qty 3

## 2016-12-19 MED ORDER — IOPAMIDOL (ISOVUE-370) INJECTION 76%
100.0000 mL | Freq: Once | INTRAVENOUS | Status: AC | PRN
  Administered 2016-12-19: 52 mL via INTRAVENOUS

## 2016-12-19 MED ORDER — FUROSEMIDE 10 MG/ML IJ SOLN
40.0000 mg | Freq: Two times a day (BID) | INTRAMUSCULAR | Status: AC
  Administered 2016-12-19 – 2016-12-20 (×3): 40 mg via INTRAVENOUS
  Filled 2016-12-19 (×3): qty 4

## 2016-12-19 MED ORDER — IOPAMIDOL (ISOVUE-370) INJECTION 76%
INTRAVENOUS | Status: AC
  Filled 2016-12-19: qty 100

## 2016-12-19 NOTE — Progress Notes (Signed)
PROGRESS NOTE    [name]  WJX:[id number] DOB: 1960-02-03 DOA: 12/16/2016 PCP: Patient, No Pcp Per    Brief Narrative:  Johnathan Meyer Harperis a 56 y.o.malewith medical history significant ofpast medical history of metastatic adenocarcinoma of the lung undergoing chemotherapy on his fourth cycle (started 7 months ago) with ongoing tobacco abuse with multiple visits to the ED in the last 8 months for epigastric pain nausea and vomiting discharged home on Protonix with minimal improvement, came back with continued abdominal pain nausea vomiting and now some hematemesis.  Hospitalist team was requested to admit and GI was consulted for EGD.      Assessment & [name]:   Principal Problem:   Hematemesis Active Problems:   HCAP (healthcare-associated pneumonia)   Adenocarcinoma of left lung, stage 4 (HCC)   Chronic obstructive pulmonary disease (HCC)   HTN (hypertension)   Intractable nausea and vomiting   Erosive gastropathy   Gastritis  #1 hematemesis secondary to erosive gastropathy and gastritis Patient had presented with hematemesis CT abdomen and pelvis done on admission showed circumferential wall thickening in the distal esophagus tracking into the esophagogastric junction with some mild adjacent edema but no extraluminal gas. 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 Negative for bowel obstruction or colonic diverticulitis. Patient was admitted placed on IV fluids H&H monitored and patient placed on a PPI IV. Gastroenterology was consulted and patient underwent upper endoscopy 12/17/2016 which showed nonbleeding erosive gastropathy and gastritis. It was recommended that patient remain on PPI orally twice daily as well as Zofran as needed. Patient was placed in a full liquid diet and diet advanced to a soft diet which he tolerated. Hemoglobin stabilized at 7.3. Outpatient follow-up with gastroenterologist, Dr. Ardis [name] in one month.  #2 Dyspnea/Probable  HCAP/?volume overload Patient was supposed to be discharged on 12/18/2016, however complained of significant SOB. CXR done was concerning for possible PNA. Patient noted with productive cough. Sputum gram stain and culuture pending. Patient pancultured.  Patient also noted with complaints of pleuritic chest pain.  Due to patient's history of cancer we will get a CT angiogram chest to rule out PE.  Patient also noted to have a 4-5 pound weight gain since admission.  Will check a MRSA PCR.  We will check a 2D echo.  Continue empiric IV vancomycin and IV cefepime.  Placed on Lasix 40 mg IV every 12 hours x3 doses.  Follow.  #3 history of metastatic adenocarcinoma of the lung stage IV Patient being followed by Dr. Lorna [name] and on chemotherapy. Patient currently stable. Patient currently a DO NOT RESUSCITATE with a poor prognosis. Outpatient follow-up with his oncologist Dr. Lorna [name].  #4 homeless Social work consulted. Information was given to patient. Patient is to be discharged to [facility].  #5 hypertension Continue home regimen of Norvasc.  #6 probable COPD/ongoing tobacco abuse Tobacco cessation. Patient started on Brovana and pulmicort and nebs. Outpatient follow-up.     DVT prophylaxis: SCD Code Status: DNR Family Communication: Updated patient.  No family at bedside. Disposition [name]: Likely home once acute respiratory issues have improved and close to baseline.   Consultants:   Gastroenterology: [name] [name] 12/17/2016      Procedures:   Upper endoscopy 12/17/2016  CT abdomen and pelvis 12/16/2016  Chest x-ray 12/16/2016, 12/17/2016, 12/18/2016  Antimicrobials:   IV cefepime 12/18/2016  IV vancomycin 12/18/2016   Subjective: Patient laying in bed states breathing has improved since yesterday however not at baseline.  Patient states having a  productive cough.  Patient with some complaints of pleuritic chest pain.  Patient states breathing  treatment helping with shortness of breath.  Objective: Vitals:   12/19/16 0500 12/19/16 0543 12/19/16 0746 12/19/16 1036  BP:  131/78  126/79  Pulse:  77    Resp:  18    Temp:  98.7 F (37.1 C)    TempSrc:  Oral    SpO2:  96% 98%   Weight: 63.7 kg (140 lb 6.9 oz)     Height:        Intake/Output Summary (Last 24 hours) at 12/19/2016 1121 Last data filed at 12/19/2016 0353 Gross per 24 hour  Intake 240 ml  Output 1650 ml  Net -1410 ml   Filed Weights   12/17/16 1033 12/18/16 1500 12/19/16 0500  Weight: 61.7 kg (136 lb) 64 kg (141 lb 1.5 oz) 63.7 kg (140 lb 6.9 oz)    Examination:  General exam: NAD Respiratory system: Fair air movement.  Some scattered crackles.  No wheezing.  Respiratory effort normal. Cardiovascular system: S1 & S2 heard, RRR. No JVD, murmurs, rubs, gallops or clicks. No pedal edema. Gastrointestinal system: Abdomen is nondistended, soft and nontender. No organomegaly or masses felt. Normal bowel sounds heard. Central nervous system: Alert and oriented. No focal neurological deficits. Extremities: Symmetric 5 x 5 power. Skin: No rashes, lesions or ulcers Psychiatry: Judgement and insight appear normal. Mood & affect appropriate.     Data Reviewed: I have personally reviewed following labs and imaging studies  CBC: Recent Labs  Lab 12/16/16 1138 12/17/16 0650 12/17/16 1448 12/18/16 0453 12/18/16 1342 12/19/16 0420  WBC 8.4 4.3  --   --   --  2.2*  NEUTROABS  --   --   --   --   --  2.0  HGB 9.2* 7.9* 7.8* 7.2* 7.9* 7.3*  HCT 27.9* 24.0* 24.0* 21.3* 24.0* 21.7*  MCV 106.5* 109.1*  --   --   --  105.9*  PLT 252 162  --   --   --  867*   Basic Metabolic Panel: Recent Labs  Lab 12/16/16 1138 12/17/16 0650 12/18/16 0453 12/19/16 0420  NA 138 142 139 136  K 3.8 4.0 3.5 4.2  CL 101 110 109 105  CO2 26 26 25 25   GLUCOSE 102* 75 98 183*  BUN 23* 18 11 10   CREATININE 1.61* 1.35* 1.31* 1.27*  CALCIUM 9.4 8.8* 8.5* 8.8*  MG  --   --  1.7  2.3   GFR: Estimated Creatinine Clearance: 57.8 mL/min (A) (by C-G formula based on SCr of 1.27 mg/dL (H)). Liver Function Tests: Recent Labs  Lab 12/16/16 1138  AST 51*  ALT 34  ALKPHOS 90  BILITOT 0.9  PROT 8.0  ALBUMIN 3.6   Recent Labs  Lab 12/16/16 1138  LIPASE 20   No results for input(s): AMMONIA in the last 168 hours. Coagulation Profile: No results for input(s): INR, PROTIME in the last 168 hours. Cardiac Enzymes: No results for input(s): CKTOTAL, CKMB, CKMBINDEX, TROPONINI in the last 168 hours. BNP (last 3 results) No results for input(s): PROBNP in the last 8760 hours. HbA1C: No results for input(s): HGBA1C in the last 72 hours. CBG: No results for input(s): GLUCAP in the last 168 hours. Lipid Profile: No results for input(s): CHOL, HDL, LDLCALC, TRIG, CHOLHDL, LDLDIRECT in the last 72 hours. Thyroid Function Tests: No results for input(s): TSH, T4TOTAL, FREET4, T3FREE, THYROIDAB in the last 72 hours. Anemia Panel: No results  for input(s): VITAMINB12, FOLATE, FERRITIN, TIBC, IRON, RETICCTPCT in the last 72 hours. Sepsis Labs: Recent Labs  Lab 12/16/16 1155  LATICACIDVEN 0.95    Recent Results (from the past 240 hour(s))  TECHNOLOGIST REVIEW     Status: None   Collection Time: 12/11/16  9:42 AM  Result Value Ref Range Status   Technologist Review Sl poly, [name] teardrop and ovalos  Final         Radiology Studies: Dg Chest 2 View  Result Date: 12/18/2016 CLINICAL DATA:  Increasing shortness of breath. History of lung cancer. EXAM: CHEST  2 VIEW COMPARISON:  Chest x-ray dated December 16, 2016. FINDINGS: Unchanged right chest wall port catheter with the tip at the cavoatrial junction. Stable cardiomediastinal silhouette. Increased interstitial markings are more conspicuous on today's study. Increased patchy reticulonodular densities in the left lower lobe. Unchanged small left pleural effusion. No pneumothorax. Stable left upper lobe spiculated  nodule. No acute osseous abnormality. IMPRESSION: 1. Increasing patchy density in the left lower lobe, which could reflect superimposed infection on known lymphangitic carcinomatosis. 2. Stable spiculated left upper lobe nodule. Electronically Signed   By: Titus Dubin M.D.   On: 12/18/2016 16:32        Scheduled Meds: . amLODipine  10 mg Oral Daily  . arformoterol  15 mcg Nebulization BID  . budesonide (PULMICORT) nebulizer solution  0.5 mg Nebulization BID  . furosemide  40 mg Intravenous Q12H  . guaiFENesin  1,200 mg Oral BID  . ipratropium-albuterol  3 mL Nebulization Q6H  . loratadine  10 mg Oral Daily  . pantoprazole  40 mg Oral BID   Continuous Infusions: . ceFEPime (MAXIPIME) IV Stopped (12/19/16 0656)  . vancomycin Stopped (12/18/16 2209)     LOS: 3 days    Time spent: 35 mins    Irine Seal, MD [facility] Pager [phone]  If 7PM-7AM, please contact night-coverage [email or web address] Password [email or web address] 12/19/2016, 11:21 AM

## 2016-12-20 ENCOUNTER — Inpatient Hospital Stay (HOSPITAL_COMMUNITY): Payer: Medicaid Other

## 2016-12-20 DIAGNOSIS — R0600 Dyspnea, unspecified: Secondary | ICD-10-CM

## 2016-12-20 LAB — CBC WITH DIFFERENTIAL/PLATELET
BASOS ABS: 0 10*3/uL (ref 0.0–0.1)
BASOS PCT: 0 %
EOS ABS: 0 10*3/uL (ref 0.0–0.7)
Eosinophils Relative: 0 %
HEMATOCRIT: 22.4 % — AB (ref 39.0–52.0)
HEMOGLOBIN: 7.7 g/dL — AB (ref 13.0–17.0)
Lymphocytes Relative: 8 %
Lymphs Abs: 0.4 10*3/uL — ABNORMAL LOW (ref 0.7–4.0)
MCH: 36.2 pg — ABNORMAL HIGH (ref 26.0–34.0)
MCHC: 34.4 g/dL (ref 30.0–36.0)
MCV: 105.2 fL — ABNORMAL HIGH (ref 78.0–100.0)
MONO ABS: 0.2 10*3/uL (ref 0.1–1.0)
MONOS PCT: 5 %
NEUTROS PCT: 87 %
Neutro Abs: 4.1 10*3/uL (ref 1.7–7.7)
Platelets: 87 10*3/uL — ABNORMAL LOW (ref 150–400)
RBC: 2.13 MIL/uL — ABNORMAL LOW (ref 4.22–5.81)
RDW: 14.7 % (ref 11.5–15.5)
WBC: 4.7 10*3/uL (ref 4.0–10.5)

## 2016-12-20 LAB — ECHOCARDIOGRAM COMPLETE
HEIGHTINCHES: 71 in
WEIGHTICAEL: 1961.21 [oz_av]

## 2016-12-20 LAB — MRSA PCR SCREENING: MRSA BY PCR: NEGATIVE

## 2016-12-20 LAB — BASIC METABOLIC PANEL
ANION GAP: 10 (ref 5–15)
BUN: 14 mg/dL (ref 6–20)
CO2: 28 mmol/L (ref 22–32)
CREATININE: 1.25 mg/dL — AB (ref 0.61–1.24)
Calcium: 9.3 mg/dL (ref 8.9–10.3)
Chloride: 103 mmol/L (ref 101–111)
GFR calc non Af Amer: >60 mL/min (ref >60)
Glucose, Bld: 98 mg/dL (ref 65–99)
Potassium: 4 mmol/L (ref 3.5–5.1)
SODIUM: 141 mmol/L (ref 135–145)

## 2016-12-20 LAB — EXPECTORATED SPUTUM ASSESSMENT W REFEX TO RESP CULTURE

## 2016-12-20 LAB — EXPECTORATED SPUTUM ASSESSMENT W GRAM STAIN, RFLX TO RESP C

## 2016-12-20 MED ORDER — HYDROCODONE-HOMATROPINE 5-1.5 MG/5ML PO SYRP
5.0000 mL | ORAL_SOLUTION | Freq: Three times a day (TID) | ORAL | Status: AC
  Administered 2016-12-20 – 2016-12-22 (×9): 5 mL via ORAL
  Filled 2016-12-20 (×9): qty 5

## 2016-12-20 MED ORDER — ADULT MULTIVITAMIN W/MINERALS CH
1.0000 | ORAL_TABLET | Freq: Every day | ORAL | Status: DC
  Administered 2016-12-20 – 2016-12-24 (×5): 1 via ORAL
  Filled 2016-12-20 (×5): qty 1

## 2016-12-20 MED ORDER — HYDROCODONE-HOMATROPINE 5-1.5 MG/5ML PO SYRP
5.0000 mL | ORAL_SOLUTION | Freq: Three times a day (TID) | ORAL | Status: DC | PRN
  Administered 2016-12-23: 5 mL via ORAL
  Filled 2016-12-20: qty 5

## 2016-12-20 MED ORDER — FUROSEMIDE 10 MG/ML IJ SOLN
40.0000 mg | Freq: Two times a day (BID) | INTRAMUSCULAR | Status: DC
  Administered 2016-12-20 – 2016-12-21 (×2): 40 mg via INTRAVENOUS
  Filled 2016-12-20 (×2): qty 4

## 2016-12-20 MED ORDER — ALUM & MAG HYDROXIDE-SIMETH 200-200-20 MG/5ML PO SUSP: 30.0000 mL | Freq: Four times a day (QID) | ORAL | Status: DC | PRN

## 2016-12-20 NOTE — Progress Notes (Signed)
  Echocardiogram 2D Echocardiogram has been performed.  [name] M 12/20/2016, 2:36 PM

## 2016-12-20 NOTE — Progress Notes (Signed)
PROGRESS NOTE    [name]  GLO:[id number] DOB: 08/10/1960 DOA: 12/16/2016 PCP: Patient, No Pcp Per    Brief Narrative:  Johnathan Meyer Harperis a 56 y.o.malewith medical history significant ofpast medical history of metastatic adenocarcinoma of the lung undergoing chemotherapy on his fourth cycle (started 7 months ago) with ongoing tobacco abuse with multiple visits to the ED in the last 8 months for epigastric pain nausea and vomiting discharged home on Protonix with minimal improvement, came back with continued abdominal pain nausea vomiting and now some hematemesis.  Hospitalist team was requested to admit and GI was consulted for EGD.      Assessment & Plan:   Principal Problem:   Hematemesis Active Problems:   HCAP (healthcare-associated pneumonia)   Dyspnea   Adenocarcinoma of left lung, stage 4 (HCC)   Chronic obstructive pulmonary disease (HCC)   HTN (hypertension)   Intractable nausea and vomiting   Erosive gastropathy   Gastritis  #1 hematemesis secondary to erosive gastropathy and gastritis Patient had presented with hematemesis CT abdomen and pelvis done on admission showed circumferential wall thickening in the distal esophagus tracking into the esophagogastric junction with some mild adjacent edema but no extraluminal gas. 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 Negative for bowel obstruction or colonic diverticulitis. Patient was admitted placed on IV fluids H&H monitored and patient placed on a PPI IV. Gastroenterology was consulted and patient underwent upper endoscopy 12/17/2016 which showed nonbleeding erosive gastropathy and gastritis. It was recommended that patient remain on PPI orally twice daily as well as Zofran as needed. Patient was placed in a full liquid diet and diet advanced to a soft diet which he tolerated. Hemoglobin stabilized at 7.7 today. Outpatient follow-up with gastroenterologist, [name] in one month.  #2  Dyspnea/Probable HCAP/?volume overload Patient was supposed to be discharged on 12/18/2016, however complained of significant SOB. CXR done was concerning for possible PNA. Patient noted with productive cough. Sputum gram stain and culuture pending. Patient pancultured.  Patient also noted with complaints of pleuritic chest pain.  Due to patient's history of cancer, CT angiogram chest to rule out PE was done 12/19/2016 which was negative for an acute PE, posttreatment changes in the left hemithorax with residual confluent soft tissue in the left upper lobe was stable, small pleural effusions and interstitial thickening suggest mild CHF.  Residual changes of lymphangitic spread of tumor in the lingula and left lower lobe is stable.  T12 sclerotic metastases unchanged..  Patient also noted to have a 4-5 pound weight gain since admission.  MRSA PCR is negative.  2D echo pending.  Patient with a urine output of 2.550 L over the past 24 hours.  Patient is -4.6 L during his hospitalization.  Current weight is 122 pounds from 140 pounds from 136 pounds on admission.  Discontinue IV vancomycin.  Continue IV cefepime.  Continue IV Lasix.   #3 history of metastatic adenocarcinoma of the lung stage IV Patient being followed by [name] and on chemotherapy. Patient currently stable. Patient currently a DO NOT RESUSCITATE with a poor prognosis. Outpatient follow-up with his oncologist [name].  #4 homeless Social work consulted. Information was given to patient. Patient is to be discharged to [facility] when medically stable and close to baseline.  #5 hypertension Continue home regimen of Norvasc.  #6 probable COPD/ongoing tobacco abuse Tobacco cessation.  Continue Brovana and pulmicort and nebs. Outpatient follow-up.     DVT prophylaxis: SCD Code Status: DNR  Family Communication: Updated patient.  No family at bedside. Disposition Plan: Likely home once acute respiratory  issues have improved and close to baseline.   Consultants:   Gastroenterology: [name] Plan 12/17/2016      Procedures:   Upper endoscopy 12/17/2016  CT abdomen and pelvis 12/16/2016  Chest x-ray 12/16/2016, 12/17/2016, 12/18/2016  Antimicrobials:   IV cefepime 12/18/2016  IV vancomycin 12/18/2016>>>>> 12/20/2016   Subjective: Patient laying in bed states breathing with no significant change however looks like he is breathing better.  Patient states breathing is not at his baseline.  Patient with complaints of still some pleuritic chest pain.  Patient stable states had some bouts of hematemesis.  Patient with complaints of nausea and some emesis.  Objective: Vitals:   12/19/16 2031 12/20/16 0440 12/20/16 0500 12/20/16 0843  BP: 123/75 128/66    Pulse: 86 90    Resp: 18 18    Temp: 97.8 F (36.6 C) 98 F (36.7 C)    TempSrc: Oral Oral    SpO2: 97% 96%  99%  Weight:   55.6 kg (122 lb 9.2 oz)   Height:        Intake/Output Summary (Last 24 hours) at 12/20/2016 1036 Last data filed at 12/20/2016 1027 Gross per 24 hour  Intake 1080 ml  Output 2701 ml  Net -1621 ml   Filed Weights   12/18/16 1500 12/19/16 0500 12/20/16 0500  Weight: 64 kg (141 lb 1.5 oz) 63.7 kg (140 lb 6.9 oz) 55.6 kg (122 lb 9.2 oz)    Examination:  General exam: NAD Respiratory system: Fair air movement.  Some scattered crackles.  No wheezing.  Respiratory effort normal. Cardiovascular system: RRR no m/r/g. No pedal edema. Gastrointestinal system: Abdomen is soft/NT/ND/+BS. No organomegaly or masses felt. Normal bowel sounds heard. Central nervous system: Alert and oriented. No focal neurological deficits. Extremities: Symmetric 5 x 5 power. Skin: No rashes, lesions or ulcers Psychiatry: Judgement and insight appear normal. Mood & affect appropriate.     Data Reviewed: I have personally reviewed following labs and imaging studies  CBC: Recent Labs  Lab 12/16/16 1138 12/17/16 0650  12/17/16 1448 12/18/16 0453 12/18/16 1342 12/19/16 0420 12/20/16 0425  WBC 8.4 4.3  --   --   --  2.2* 4.7  NEUTROABS  --   --   --   --   --  2.0 4.1  HGB 9.2* 7.9* 7.8* 7.2* 7.9* 7.3* 7.7*  HCT 27.9* 24.0* 24.0* 21.3* 24.0* 21.7* 22.4*  MCV 106.5* 109.1*  --   --   --  105.9* 105.2*  PLT 252 162  --   --   --  108* 87*   Basic Metabolic Panel: Recent Labs  Lab 12/16/16 1138 12/17/16 0650 12/18/16 0453 12/19/16 0420 12/20/16 0425  NA 138 142 139 136 141  K 3.8 4.0 3.5 4.2 4.0  CL 101 110 109 105 103  CO2 26 26 25 25 28   GLUCOSE 102* 75 98 183* 98  BUN 23* 18 11 10 14   CREATININE 1.61* 1.35* 1.31* 1.27* 1.25*  CALCIUM 9.4 8.8* 8.5* 8.8* 9.3  MG  --   --  1.7 2.3  --    GFR: Estimated Creatinine Clearance: 51.3 mL/min (A) (by C-G formula based on SCr of 1.25 mg/dL (H)). Liver Function Tests: Recent Labs  Lab 12/16/16 1138  AST 51*  ALT 34  ALKPHOS 90  BILITOT 0.9  PROT 8.0  ALBUMIN 3.6   Recent Labs  Lab 12/16/16 1138  LIPASE 20   No results for input(s): AMMONIA in the last 168 hours. Coagulation Profile: No results for input(s): INR, PROTIME in the last 168 hours. Cardiac Enzymes: No results for input(s): CKTOTAL, CKMB, CKMBINDEX, TROPONINI in the last 168 hours. BNP (last 3 results) No results for input(s): PROBNP in the last 8760 hours. HbA1C: No results for input(s): HGBA1C in the last 72 hours. CBG: No results for input(s): GLUCAP in the last 168 hours. Lipid Profile: No results for input(s): CHOL, HDL, LDLCALC, TRIG, CHOLHDL, LDLDIRECT in the last 72 hours. Thyroid Function Tests: No results for input(s): TSH, T4TOTAL, FREET4, T3FREE, THYROIDAB in the last 72 hours. Anemia Panel: No results for input(s): VITAMINB12, FOLATE, FERRITIN, TIBC, IRON, RETICCTPCT in the last 72 hours. Sepsis Labs: Recent Labs  Lab 12/16/16 1155  LATICACIDVEN 0.95    Recent Results (from the past 240 hour(s))  TECHNOLOGIST REVIEW     Status: None   Collection  Time: 12/11/16  9:42 AM  Result Value Ref Range Status   Technologist Review Sl poly, few teardrop and ovalos  Final  Culture, blood (routine x 2) Call MD if unable to obtain prior to antibiotics being given     Status: None (Preliminary result)   Collection Time: 12/18/16  8:19 PM  Result Value Ref Range Status   Specimen Description BLOOD LEFT ANTECUBITAL  Final   Special Requests   Final    BOTTLES DRAWN AEROBIC ONLY Blood Culture adequate volume   Culture   Final    NO GROWTH < 24 HOURS Performed at [facility], [address], [place], [place] [address]    Report Status PENDING  Incomplete  Culture, blood (routine x 2) Call MD if unable to obtain prior to antibiotics being given     Status: None (Preliminary result)   Collection Time: 12/18/16  8:27 PM  Result Value Ref Range Status   Specimen Description BLOOD LEFT ANTECUBITAL  Final   Special Requests   Final    BOTTLES DRAWN AEROBIC AND ANAEROBIC Blood Culture adequate volume   Culture   Final    NO GROWTH < 24 HOURS Performed at [facility], [place] [address], [place], [place] [address]    Report Status PENDING  Incomplete  Culture, sputum-assessment     Status: None   Collection Time: 12/20/16  5:39 AM  Result Value Ref Range Status   Specimen Description SPUTUM  Final   Special Requests NONE  Final   Sputum evaluation THIS SPECIMEN IS ACCEPTABLE FOR SPUTUM CULTURE  Final   Report Status 12/20/2016 FINAL  Final         Radiology Studies: Dg Chest 2 View  Result Date: 12/18/2016 CLINICAL DATA:  Increasing shortness of breath. History of lung cancer. EXAM: CHEST  2 VIEW COMPARISON:  Chest x-ray dated December 16, 2016. FINDINGS: Unchanged right chest wall port catheter with the tip at the cavoatrial junction. Stable cardiomediastinal silhouette. Increased interstitial markings are more conspicuous on today's study. Increased patchy reticulonodular densities in the left lower lobe. Unchanged small left  pleural effusion. No pneumothorax. Stable left upper lobe spiculated nodule. No acute osseous abnormality. IMPRESSION: 1. Increasing patchy density in the left lower lobe, which could reflect superimposed infection on known lymphangitic carcinomatosis. 2. Stable spiculated left upper lobe nodule. Electronically Signed   By: Titus Dubin M.D.   On: 12/18/2016 16:32   Ct Angio Chest Pe W Or Wo Contrast  Result Date: 12/19/2016 CLINICAL DATA:  Evaluate  for pulmonary embolus. History of lung cancer. EXAM: CT ANGIOGRAPHY CHEST WITH CONTRAST TECHNIQUE: Multidetector CT imaging of the chest was performed using the standard protocol during bolus administration of intravenous contrast. Multiplanar CT image reconstructions and MIPs were obtained to evaluate the vascular anatomy. CONTRAST:  52mL ISOVUE-370 IOPAMIDOL (ISOVUE-370) INJECTION 76% COMPARISON:  11/27/2016 FINDINGS: Cardiovascular: Satisfactory opacification of the pulmonary arteries to the segmental level. No evidence of pulmonary embolism. Normal heart size. Mediastinum/Nodes: Trachea appears patent and is midline. Normal appearance of the esophagus. Haziness within the mediastinal fat without discrete adenopathy is again noted. Lungs/Pleura: Small pleural effusions are identified right greater than left. Moderate to advanced changes of centrilobular emphysema. Mild lower lung zone predominant interstitial thickening suggest pulmonary edema. Stable appearance of nodular septal thickening in the lingula and left lower lobe. Confluent irregular soft tissue in the left upper lobe measures 2.8 cm and is unchanged from previous exam. No new sites of disease identified within the lungs. Upper Abdomen: No acute abnormality. Musculoskeletal: Stable diffuse scleroses involving the T12 vertebra compatible with metastatic disease. Review of the MIP images confirms the above findings. IMPRESSION: 1. No evidence for acute pulmonary embolus. 2. Post treatment changes in  the left hemithorax with residual confluent soft tissue in the left upper lobe, stable. 3. Small pleural effusions and interstitial thickening suggests mild CHF. 4. Residual changes of lymphangitic spread of tumor in the lingula and left lower lobe, stable. 5. T12 sclerotic metastasis, unchanged Electronically Signed   By: Kerby Moors M.D.   On: 12/19/2016 13:24        Scheduled Meds: . amLODipine  10 mg Oral Daily  . arformoterol  15 mcg Nebulization BID  . budesonide (PULMICORT) nebulizer solution  0.5 mg Nebulization BID  . furosemide  40 mg Intravenous Q12H  . guaiFENesin  1,200 mg Oral BID  . ipratropium-albuterol  3 mL Nebulization TID  . loratadine  10 mg Oral Daily  . pantoprazole  40 mg Oral BID   Continuous Infusions: . ceFEPime (MAXIPIME) IV Stopped (12/20/16 0535)  . vancomycin Stopped (12/19/16 2304)     LOS: 4 days    Time spent: 24 mins    Irine Seal, MD [facility] Pager [phone]  If 7PM-7AM, please contact night-coverage [email or web address] Password [email or web address] 12/20/2016, 10:36 AM

## 2016-12-20 NOTE — Progress Notes (Signed)
Initial Nutrition Assessment  DOCUMENTATION CODES:   Severe malnutrition in context of chronic illness, Underweight  INTERVENTION:  - Will order Magic Cup TID with meals, each supplement provides 290 kcal and 9 grams of protein - Will order daily multivitamin with minerals.  - Continue to encourage PO intakes of meals and supplements. - RD will continue to monitor for additional nutrition-related needs.  NUTRITION DIAGNOSIS:   Severe Malnutrition related to chronic illness, catabolic illness, cancer and cancer related treatments as evidenced by estimated needs.  GOAL:   Patient will meet greater than or equal to 90% of their needs, Weight gain  MONITOR:   PO intake, Supplement acceptance, Weight trends, Labs  REASON FOR ASSESSMENT:   Other (Comment)(Underweight BMI )  ASSESSMENT:   56 y.o. male with medical history significant of past medical history of metastatic adenocarcinoma of the lung undergoing chemotherapy on his fourth cycle (started 7 months ago) with ongoing tobacco abuse with multiple visits to the ED in the last 8 months for epigastric pain nausea and vomiting discharged home on Protonix with minimal improvement, came back with continued abdominal pain nausea vomiting and now some hematemesis.  Hospitalist team was requested to admit and GI was consulted for EGD.  Per chart review, pt consumed 30% of lunch on 11/27, 10% of lunch on 11/28, and 25% of lunch yesterday. Breakfast tray on bedside table and pt had consumed 100% of grits and ~25% of [place] toast. He also consumed 2 cups of juice. Pt reports that his stomach feels "terrible" but is unable to discern if this is pain or nausea-related. His stomach feels worse with most, but not all, PO intakes, but he is unable to specify which items do not bother him. Pt does indicate that he likes ice cream and he is open to trying vanilla Magic Cup on meal trays.   Unable to obtain much other information at this time; will  attempt to obtain further information at follow-up. Per chart review, pt has lost 12 lbs (9% body weight) in the past 1 month which is significant for time frame. Per notes, pt is homeless and will be discharged to [facility] when medically stable.   Medications reviewed; 40 mg IV Lasix BID, 40 mg oral Protonix BID. Labs reviewed; creatinine: 1.25 mg/dL.       NUTRITION - FOCUSED PHYSICAL EXAM:    Most Recent Value  Orbital Region  Mild depletion  Upper Arm Region  Severe depletion  Thoracic and Lumbar Region  Unable to assess  Buccal Region  Moderate depletion  Temple Region  Moderate depletion  Clavicle Bone Region  Severe depletion  Clavicle and Acromion Bone Region  Severe depletion  Scapular Bone Region  Unable to assess  Dorsal Hand  Moderate depletion  Patellar Region  Moderate depletion  Anterior Thigh Region  Severe depletion  Posterior Calf Region  Severe depletion  Edema (RD Assessment)  None  Hair  Reviewed  Eyes  Other (Comment) [bloodshot]  Mouth  Unable to assess  Skin  Reviewed  Nails  Reviewed       Diet Order:  DIET SOFT Room service appropriate? Yes; Fluid consistency: Thin Diet general  EDUCATION NEEDS:   No education needs have been identified at this time  Skin:  Skin Assessment: Reviewed RN Assessment  Last BM:  11/25 (PTA)  Height:   Ht Readings from Last 1 Encounters:  12/17/16 5\' 11"  (1.803 m)    Weight:   Wt Readings from Last 1 Encounters:  12/20/16 122 lb 9.2 oz (55.6 kg)    Ideal Body Weight:  78.18 kg  BMI:  Body mass index is 17.1 kg/m.  Estimated Nutritional Needs:   Kcal:  1835-2060 (33-37 kcal/kg)  Protein:  83-100 grams (1.5-1.8 grams/kg)  Fluid:  >/= 1.8 L/day      Jarome Matin, MS, RD, LDN, [facility] Inpatient Clinical Dietitian Pager # [phone] After hours/weekend pager # [phone]

## 2016-12-20 NOTE — Progress Notes (Signed)
Received report from previous RN. Pts assessment is unchanged from morning assessment. Will continue to monitor.

## 2016-12-20 NOTE — Progress Notes (Signed)
SATURATION QUALIFICATIONS: (This note is used to comply with regulatory documentation for home oxygen)  Patient Saturations on Room Air at Rest = 98%  Patient Saturations on Room Air while Ambulating = 94%  Patient Saturations on 0 Liters of oxygen while Ambulating = 94%  Please briefly explain why patient needs home oxygen: 

## 2016-12-21 LAB — CBC WITH DIFFERENTIAL/PLATELET
BASOS ABS: 0 10*3/uL (ref 0.0–0.1)
BASOS PCT: 0 %
EOS ABS: 0.1 10*3/uL (ref 0.0–0.7)
Eosinophils Relative: 2 %
HEMATOCRIT: 26.3 % — AB (ref 39.0–52.0)
HEMOGLOBIN: 8.6 g/dL — AB (ref 13.0–17.0)
Lymphocytes Relative: 9 %
Lymphs Abs: 0.4 10*3/uL — ABNORMAL LOW (ref 0.7–4.0)
MCH: 34.8 pg — ABNORMAL HIGH (ref 26.0–34.0)
MCHC: 32.7 g/dL (ref 30.0–36.0)
MCV: 106.5 fL — ABNORMAL HIGH (ref 78.0–100.0)
Monocytes Absolute: 0.3 10*3/uL (ref 0.1–1.0)
Monocytes Relative: 9 %
NEUTROS ABS: 3.1 10*3/uL (ref 1.7–7.7)
NEUTROS PCT: 80 %
Platelets: 80 10*3/uL — ABNORMAL LOW (ref 150–400)
RBC: 2.47 MIL/uL — ABNORMAL LOW (ref 4.22–5.81)
RDW: 14.9 % (ref 11.5–15.5)
WBC: 3.9 10*3/uL — ABNORMAL LOW (ref 4.0–10.5)

## 2016-12-21 LAB — BASIC METABOLIC PANEL
ANION GAP: 11 (ref 5–15)
BUN: 17 mg/dL (ref 6–20)
CALCIUM: 9.3 mg/dL (ref 8.9–10.3)
CHLORIDE: 95 mmol/L — AB (ref 101–111)
CO2: 32 mmol/L (ref 22–32)
Creatinine, Ser: 1.39 mg/dL — ABNORMAL HIGH (ref 0.61–1.24)
GFR calc non Af Amer: 55 mL/min — ABNORMAL LOW (ref >60)
Glucose, Bld: 87 mg/dL (ref 65–99)
Potassium: 3.6 mmol/L (ref 3.5–5.1)
SODIUM: 138 mmol/L (ref 135–145)

## 2016-12-21 LAB — LEGIONELLA PNEUMOPHILA SEROGP 1 UR AG: L. PNEUMOPHILA SEROGP 1 UR AG: NEGATIVE

## 2016-12-21 MED ORDER — FUROSEMIDE 40 MG PO TABS
40.0000 mg | ORAL_TABLET | Freq: Every day | ORAL | Status: DC
  Administered 2016-12-22 – 2016-12-24 (×3): 40 mg via ORAL
  Filled 2016-12-21 (×3): qty 1

## 2016-12-21 MED ORDER — POTASSIUM CHLORIDE CRYS ER 20 MEQ PO TBCR
40.0000 meq | EXTENDED_RELEASE_TABLET | Freq: Once | ORAL | Status: AC
  Administered 2016-12-21: 40 meq via ORAL
  Filled 2016-12-21: qty 2

## 2016-12-21 MED ORDER — AMOXICILLIN-POT CLAVULANATE 875-125 MG PO TABS
1.0000 | ORAL_TABLET | Freq: Two times a day (BID) | ORAL | Status: DC
  Administered 2016-12-21 – 2016-12-22 (×3): 1 via ORAL
  Filled 2016-12-21 (×3): qty 1

## 2016-12-21 NOTE — Progress Notes (Signed)
Pt refused to complete his neb tx. No distress noted at this time.

## 2016-12-21 NOTE — Progress Notes (Signed)
PROGRESS NOTE    GEARL BARATTA  OZD:[id number] DOB: 1960/05/28 DOA: 12/16/2016 PCP: Patient, No Pcp Per    Brief Narrative:  Johnathan Meyer Harperis a 56 y.o.malewith medical history significant ofpast medical history of metastatic adenocarcinoma of the lung undergoing chemotherapy on his fourth cycle (started 7 months ago) with ongoing tobacco abuse with multiple visits to the ED in the last 8 months for epigastric pain nausea and vomiting discharged home on Protonix with minimal improvement, came back with continued abdominal pain nausea vomiting and now some hematemesis.  Hospitalist team was requested to admit and GI was consulted for EGD.      Assessment & Plan:   Principal Problem:   Hematemesis Active Problems:   HCAP (healthcare-associated pneumonia)   Dyspnea   Adenocarcinoma of left lung, stage 4 (HCC)   Chronic obstructive pulmonary disease (HCC)   HTN (hypertension)   Intractable nausea and vomiting   Erosive gastropathy   Gastritis  #1 hematemesis secondary to erosive gastropathy and gastritis Patient had presented with hematemesis CT abdomen and pelvis done on admission showed circumferential wall thickening in the distal esophagus tracking into the esophagogastric junction with some mild adjacent edema but no extraluminal gas. 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 Negative for bowel obstruction or colonic diverticulitis. Patient was admitted placed on IV fluids H&H monitored and patient placed on a PPI IV. Gastroenterology was consulted and patient underwent upper endoscopy 12/17/2016 which showed nonbleeding erosive gastropathy and gastritis. It was recommended that patient remain on PPI orally twice daily as well as Zofran as needed. Patient was placed in a full liquid diet and diet advanced to a soft diet which he is tolerating. Hemoglobin stabilized at 8.6 today. Outpatient follow-up with gastroenterologist, [name] in one  month.  #2 Dyspnea/Probable HCAP/?volume overload Patient was supposed to be discharged on 12/18/2016, however complained of significant SOB. CXR done was concerning for possible PNA. Patient noted with productive cough. Sputum gram stain and culuture pending. Patient pancultured.  Patient also noted with complaints of pleuritic chest pain.  Due to patient's history of cancer, CT angiogram chest to rule out PE was done 12/19/2016 which was negative for an acute PE, posttreatment changes in the left hemithorax with residual confluent soft tissue in the left upper lobe was stable, small pleural effusions and interstitial thickening suggest mild CHF.  Residual changes of lymphangitic spread of tumor in the lingula and left lower lobe is stable.  T12 sclerotic metastases unchanged..  Patient also noted to have a 4-5 pound weight gain since admission.  MRSA PCR is negative.  2D echo with EF 55-60% with NWMA, grade 1 diastolic dysfunction .  Patient with a urine output of 2.950 L over the past 24 hours.  Patient is -5.6 L during his hospitalization.  Current weight is 119 ponds from 122 pounds from 140 pounds from 136 pounds on admission.  Discontinued IV vancomycin.  Discontinued IV cefepime.  Change IV Lasix to oral lasix.   #3 history of metastatic adenocarcinoma of the lung stage IV Patient being followed by [name] and on chemotherapy. Patient currently stable. Patient currently a DO NOT RESUSCITATE with a poor prognosis. Outpatient follow-up with his oncologist [name].  #4 homeless Social work consulted. Information was given to patient. Patient is to be discharged to [place] when medically stable and close to baseline.  #5 hypertension Continue home meds Norvasc.  Continue diuretics.  #6 probable COPD/ongoing tobacco abuse Tobacco cessation.  Continue Brovana and pulmicort and nebs.  On discharge would likely need to be discharged with Symbicort in addition  to his Combivent.  Outpatient follow-up.     DVT prophylaxis: SCD Code Status: DNR Family Communication: Updated patient.  No family at bedside. Disposition Plan: Likely home once acute respiratory issues have improved and close to baseline hopefully the next 24-48 hours.   Consultants:   Gastroenterology: [name] Plan 12/17/2016      Procedures:   Upper endoscopy 12/17/2016  CT abdomen and pelvis 12/16/2016  Chest x-ray 12/16/2016, 12/17/2016, 12/18/2016  Antimicrobials:   IV cefepime 12/18/2016>>>>>> 12/21/2016  IV vancomycin 12/18/2016>>>>> 12/20/2016  Augmentin 12/21/2016   Subjective: Patient eating up in chair.  Patient states does not feel too well and just threw up.  Shortness of breath improved.  No chest pain.    Objective: Vitals:   12/20/16 2027 12/21/16 0500 12/21/16 0529 12/21/16 0802  BP: 136/72  118/75   Pulse: 98  85   Resp: 20  20   Temp: 99.5 F (37.5 C)  98.1 F (36.7 C)   TempSrc: Oral  Oral   SpO2: 94%  96% 90%  Weight:  54.4 kg (119 lb 14.4 oz)    Height:        Intake/Output Summary (Last 24 hours) at 12/21/2016 1240 Last data filed at 12/21/2016 0900 Gross per 24 hour  Intake 400 ml  Output 2400 ml  Net -2000 ml   Filed Weights   12/19/16 0500 12/20/16 0500 12/21/16 0500  Weight: 63.7 kg (140 lb 6.9 oz) 55.6 kg (122 lb 9.2 oz) 54.4 kg (119 lb 14.4 oz)    Examination:  General exam: NAD Respiratory system: Lungs clear to auscultation bilaterally.  Crackles improved.  No wheezing.  No rhonchi.  Respiratory effort normal. Cardiovascular system: RRR no m/r/g. No pedal edema. Gastrointestinal system: Abdomen is soft nontender, nondistended, positive bowel sounds.  No hepatosplenomegaly. Central nervous system: Alert and oriented. No focal neurological deficits. Extremities: Symmetric 5 x 5 power. Skin: No rashes, lesions or ulcers Psychiatry: Judgement and insight appear fair. Mood & affect appropriate.     Data Reviewed: I  have personally reviewed following labs and imaging studies  CBC: Recent Labs  Lab 12/16/16 1138 12/17/16 0650  12/18/16 0453 12/18/16 1342 12/19/16 0420 12/20/16 0425 12/21/16 0421  WBC 8.4 4.3  --   --   --  2.2* 4.7 3.9*  NEUTROABS  --   --   --   --   --  2.0 4.1 3.1  HGB 9.2* 7.9*   < > 7.2* 7.9* 7.3* 7.7* 8.6*  HCT 27.9* 24.0*   < > 21.3* 24.0* 21.7* 22.4* 26.3*  MCV 106.5* 109.1*  --   --   --  105.9* 105.2* 106.5*  PLT 252 162  --   --   --  108* 87* 80*   < > = values in this interval not displayed.   Basic Metabolic Panel: Recent Labs  Lab 12/17/16 0650 12/18/16 0453 12/19/16 0420 12/20/16 0425 12/21/16 0421  NA 142 139 136 141 138  K 4.0 3.5 4.2 4.0 3.6  CL 110 109 105 103 95*  CO2 26 25 25 28  32  GLUCOSE 75 98 183* 98 87  BUN 18 11 10 14 17   CREATININE 1.35* 1.31* 1.27* 1.25* 1.39*  CALCIUM 8.8* 8.5* 8.8* 9.3 9.3  MG  --  1.7 2.3  --   --    GFR: Estimated Creatinine Clearance: 45.1 mL/min (A) (  by C-G formula based on SCr of 1.39 mg/dL (H)). Liver Function Tests: Recent Labs  Lab 12/16/16 1138  AST 51*  ALT 34  ALKPHOS 90  BILITOT 0.9  PROT 8.0  ALBUMIN 3.6   Recent Labs  Lab 12/16/16 1138  LIPASE 20   No results for input(s): AMMONIA in the last 168 hours. Coagulation Profile: No results for input(s): INR, PROTIME in the last 168 hours. Cardiac Enzymes: No results for input(s): CKTOTAL, CKMB, CKMBINDEX, TROPONINI in the last 168 hours. BNP (last 3 results) No results for input(s): PROBNP in the last 8760 hours. HbA1C: No results for input(s): HGBA1C in the last 72 hours. CBG: No results for input(s): GLUCAP in the last 168 hours. Lipid Profile: No results for input(s): CHOL, HDL, LDLCALC, TRIG, CHOLHDL, LDLDIRECT in the last 72 hours. Thyroid Function Tests: No results for input(s): TSH, T4TOTAL, FREET4, T3FREE, THYROIDAB in the last 72 hours. Anemia Panel: No results for input(s): VITAMINB12, FOLATE, FERRITIN, TIBC, IRON,  RETICCTPCT in the last 72 hours. Sepsis Labs: Recent Labs  Lab 12/16/16 1155  LATICACIDVEN 0.95    Recent Results (from the past 240 hour(s))  Culture, blood (routine x 2) Call MD if unable to obtain prior to antibiotics being given     Status: None (Preliminary result)   Collection Time: 12/18/16  8:19 PM  Result Value Ref Range Status   Specimen Description BLOOD LEFT ANTECUBITAL  Final   Special Requests   Final    BOTTLES DRAWN AEROBIC ONLY Blood Culture adequate volume   Culture   Final    NO GROWTH 3 DAYS Performed at [facility], [address] [place], [place] [address]    Report Status PENDING  Incomplete  Culture, blood (routine x 2) Call MD if unable to obtain prior to antibiotics being given     Status: None (Preliminary result)   Collection Time: 12/18/16  8:27 PM  Result Value Ref Range Status   Specimen Description BLOOD LEFT ANTECUBITAL  Final   Special Requests   Final    BOTTLES DRAWN AEROBIC AND ANAEROBIC Blood Culture adequate volume   Culture   Final    NO GROWTH 3 DAYS Performed at [facility], [place] [address], [place], [place] [address]    Report Status PENDING  Incomplete  MRSA PCR Screening     Status: None   Collection Time: 12/19/16  4:43 PM  Result Value Ref Range Status   MRSA by PCR NEGATIVE NEGATIVE Final    Comment:        The GeneXpert MRSA Assay (FDA approved for NASAL specimens only), is one component of a comprehensive MRSA colonization surveillance program. It is not intended to diagnose MRSA infection nor to guide or monitor treatment for MRSA infections.   Culture, sputum-assessment     Status: None   Collection Time: 12/20/16  5:39 AM  Result Value Ref Range Status   Specimen Description SPUTUM  Final   Special Requests NONE  Final   Sputum evaluation THIS SPECIMEN IS ACCEPTABLE FOR SPUTUM CULTURE  Final   Report Status 12/20/2016 FINAL  Final  Culture, respiratory (NON-Expectorated)     Status: None  (Preliminary result)   Collection Time: 12/20/16  5:39 AM  Result Value Ref Range Status   Specimen Description SPUTUM  Final   Special Requests NONE Reflexed from [id number]  Final   Gram Stain   Final    ABUNDANT WBC PRESENT,BOTH PMN AND MONONUCLEAR MODERATE GRAM POSITIVE  COCCI IN PAIRS IN CLUSTERS FEW GRAM POSITIVE COCCI IN CHAINS MODERATE GRAM VARIABLE ROD FEW YEAST    Culture   Final    CULTURE REINCUBATED FOR BETTER GROWTH Performed at [facility], [place] [address] [place], [place] [address]    Report Status PENDING  Incomplete         Radiology Studies: Ct Angio Chest Pe W Or Wo Contrast  Result Date: 12/19/2016 CLINICAL DATA:  Evaluate for pulmonary embolus. History of lung cancer. EXAM: CT ANGIOGRAPHY CHEST WITH CONTRAST TECHNIQUE: Multidetector CT imaging of the chest was performed using the standard protocol during bolus administration of intravenous contrast. Multiplanar CT image reconstructions and MIPs were obtained to evaluate the vascular anatomy. CONTRAST:  55mL ISOVUE-370 IOPAMIDOL (ISOVUE-370) INJECTION 76% COMPARISON:  11/27/2016 FINDINGS: Cardiovascular: Satisfactory opacification of the pulmonary arteries to the segmental level. No evidence of pulmonary embolism. Normal heart size. Mediastinum/Nodes: Trachea appears patent and is midline. Normal appearance of the esophagus. Haziness within the mediastinal fat without discrete adenopathy is again noted. Lungs/Pleura: Small pleural effusions are identified right greater than left. Moderate to advanced changes of centrilobular emphysema. Mild lower lung zone predominant interstitial thickening suggest pulmonary edema. Stable appearance of nodular septal thickening in the lingula and left lower lobe. Confluent irregular soft tissue in the left upper lobe measures 2.8 cm and is unchanged from previous exam. No new sites of disease identified within the lungs. Upper Abdomen: No acute abnormality. Musculoskeletal: Stable  diffuse scleroses involving the T12 vertebra compatible with metastatic disease. Review of the MIP images confirms the above findings. IMPRESSION: 1. No evidence for acute pulmonary embolus. 2. Post treatment changes in the left hemithorax with residual confluent soft tissue in the left upper lobe, stable. 3. Small pleural effusions and interstitial thickening suggests mild CHF. 4. Residual changes of lymphangitic spread of tumor in the lingula and left lower lobe, stable. 5. T12 sclerotic metastasis, unchanged Electronically Signed   By: Kerby Moors M.D.   On: 12/19/2016 13:24        Scheduled Meds: . amLODipine  10 mg Oral Daily  . amoxicillin-clavulanate  1 tablet Oral Q12H  . arformoterol  15 mcg Nebulization BID  . budesonide (PULMICORT) nebulizer solution  0.5 mg Nebulization BID  . furosemide  40 mg Oral Daily  . guaiFENesin  1,200 mg Oral BID  . HYDROcodone-homatropine  5 mL Oral TID  . ipratropium-albuterol  3 mL Nebulization TID  . loratadine  10 mg Oral Daily  . multivitamin with minerals  1 tablet Oral Daily  . pantoprazole  40 mg Oral BID   Continuous Infusions:    LOS: 5 days    Time spent: 35 mins    Irine Seal, MD [facility] Pager [phone]  If 7PM-7AM, please contact night-coverage [email or web address] Password [email or web address] 12/21/2016, 12:40 PM

## 2016-12-22 LAB — CBC WITH DIFFERENTIAL/PLATELET
BASOS ABS: 0 10*3/uL (ref 0.0–0.1)
Basophils Relative: 0 %
EOS ABS: 0.1 10*3/uL (ref 0.0–0.7)
EOS PCT: 2 %
HCT: 24.7 % — ABNORMAL LOW (ref 39.0–52.0)
Hemoglobin: 8.3 g/dL — ABNORMAL LOW (ref 13.0–17.0)
LYMPHS PCT: 9 %
Lymphs Abs: 0.3 10*3/uL — ABNORMAL LOW (ref 0.7–4.0)
MCH: 35.3 pg — AB (ref 26.0–34.0)
MCHC: 33.6 g/dL (ref 30.0–36.0)
MCV: 105.1 fL — AB (ref 78.0–100.0)
Monocytes Absolute: 0.6 10*3/uL (ref 0.1–1.0)
Monocytes Relative: 18 %
NEUTROS PCT: 71 %
Neutro Abs: 2.4 10*3/uL (ref 1.7–7.7)
PLATELETS: 62 10*3/uL — AB (ref 150–400)
RBC: 2.35 MIL/uL — AB (ref 4.22–5.81)
RDW: 15 % (ref 11.5–15.5)
WBC: 3.5 10*3/uL — AB (ref 4.0–10.5)

## 2016-12-22 LAB — BASIC METABOLIC PANEL
Anion gap: 11 (ref 5–15)
BUN: 21 mg/dL — AB (ref 6–20)
CALCIUM: 9.2 mg/dL (ref 8.9–10.3)
CO2: 31 mmol/L (ref 22–32)
Chloride: 94 mmol/L — ABNORMAL LOW (ref 101–111)
Creatinine, Ser: 1.41 mg/dL — ABNORMAL HIGH (ref 0.61–1.24)
GFR calc Af Amer: >60 mL/min (ref >60)
GFR, EST NON AFRICAN AMERICAN: 54 mL/min — AB (ref >60)
GLUCOSE: 94 mg/dL (ref 65–99)
Potassium: 3.8 mmol/L (ref 3.5–5.1)
Sodium: 136 mmol/L (ref 135–145)

## 2016-12-22 LAB — CULTURE, RESPIRATORY W GRAM STAIN: Culture: NORMAL

## 2016-12-22 MED ORDER — DEXTROSE 5 % IV SOLN
2.0000 g | INTRAVENOUS | Status: AC
  Administered 2016-12-22 – 2016-12-23 (×2): 2 g via INTRAVENOUS
  Filled 2016-12-22 (×2): qty 2

## 2016-12-22 MED ORDER — IPRATROPIUM-ALBUTEROL 0.5-2.5 (3) MG/3ML IN SOLN
3.0000 mL | Freq: Two times a day (BID) | RESPIRATORY_TRACT | Status: DC
  Administered 2016-12-23 – 2016-12-24 (×3): 3 mL via RESPIRATORY_TRACT
  Filled 2016-12-22 (×3): qty 3

## 2016-12-22 MED ORDER — GI COCKTAIL ~~LOC~~: 30.0000 mL | Freq: Three times a day (TID) | ORAL | Status: DC | PRN

## 2016-12-22 NOTE — Progress Notes (Signed)
Pt refused all breathing tx. Pt stated his stomach hurts this am. No distress noted at this time.

## 2016-12-22 NOTE — Progress Notes (Signed)
Patient refused breathing treatment because he stated his stomach was hurting, I offered to give him pain medication but she refused that as well stated he just received pain med, informed patient he got it at 2am and its every 4hrs and he can have it now then he stated he doesn't  want  It, reinforced to let me know if he changes his mind.

## 2016-12-22 NOTE — Progress Notes (Signed)
PROGRESS NOTE    SHYHEIM TANNEY  OJJ:[id number] DOB: [birth year] DOA: 12/16/2016 PCP: Patient, No Pcp Per    Brief Narrative:  [name] a 56 y.o.malewith medical history significant ofpast medical history of metastatic adenocarcinoma of the lung undergoing chemotherapy on his fourth cycle (started 7 months ago) with ongoing tobacco abuse with multiple visits to the ED in the last 8 months for epigastric pain nausea and vomiting discharged home on Protonix with minimal improvement, came back with continued abdominal pain nausea vomiting and now some hematemesis.  Hospitalist team was requested to admit and GI was consulted for EGD.      Assessment & Plan:   Principal Problem:   Hematemesis Active Problems:   HCAP (healthcare-associated pneumonia)   Dyspnea   Adenocarcinoma of left lung, stage 4 (HCC)   Chronic obstructive pulmonary disease (HCC)   HTN (hypertension)   Intractable nausea and vomiting   Erosive gastropathy   Gastritis  #1 hematemesis secondary to erosive gastropathy and gastritis Patient had presented with hematemesis CT abdomen and pelvis done on admission showed circumferential wall thickening in the distal esophagus tracking into the esophagogastric junction with some mild adjacent edema but no extraluminal gas. 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 Negative for bowel obstruction or colonic diverticulitis. Patient was admitted placed on IV fluids H&H monitored and patient placed on a PPI IV. Gastroenterology was consulted and patient underwent upper endoscopy 12/17/2016 which showed nonbleeding erosive gastropathy and gastritis. It was recommended that patient remain on PPI orally twice daily as well as Zofran as needed. Patient was placed in a full liquid diet and diet advanced to a soft diet which he is tolerating. Hemoglobin stabilized at 8.6 today. Outpatient follow-up with gastroenterologist, [name] in one  month.  #2 Dyspnea/Probable HCAP/?volume overload Patient was supposed to be discharged on 12/18/2016, however complained of significant SOB. CXR done was concerning for possible PNA. Patient noted with productive cough. Sputum gram stain and culuture pending. Patient pancultured.  Patient also noted with complaints of pleuritic chest pain.  Due to patient's history of cancer, CT angiogram chest to rule out PE was done 12/19/2016 which was negative for an acute PE, posttreatment changes in the left hemithorax with residual confluent soft tissue in the left upper lobe was stable, small pleural effusions and interstitial thickening suggest mild CHF.  Residual changes of lymphangitic spread of tumor in the lingula and left lower lobe is stable.  T12 sclerotic metastases unchanged..  Patient also noted to have a 4-5 pound weight gain since admission.  MRSA PCR is negative.  2D echo with EF 55-60% with NWMA, grade 1 diastolic dysfunction .  Patient with a urine output of 500 cc over the past 24 hours.  Patient is -5.9 L during his hospitalization.  Current weight is 119 ponds from 122 pounds from 140 pounds from 136 pounds on admission.  Discontinued IV vancomycin.  Discontinued IV cefepime.  Change IV Lasix to oral lasix.   #3 history of metastatic adenocarcinoma of the lung stage IV Patient being followed by [name] and on chemotherapy. Patient currently stable. Patient currently a DO NOT RESUSCITATE with a poor prognosis. Outpatient follow-up with his oncologist [name].  #4 homeless Social work consulted. Information was given to patient. Patient is to be discharged to [place] when medically stable and close to baseline.  #5 hypertension Continue home meds of Norvasc.  Continue diuretics.  #6 probable COPD/ongoing tobacco abuse Tobacco cessation.  Continue Brovana and pulmicort and nebs.  On discharge would likely need to be discharged with Symbicort in addition  to his Combivent.  Outpatient follow-up.  7.  Nausea and emesis Questionable etiology.  May be secondary to oral antibiotics.  Patient passing gas and having bowel movements and as such unlikely to be an obstruction.  Will discontinue oral antibiotics and place on IV Rocephin for 2 more days to complete antibiotic treatment.     DVT prophylaxis: SCD Code Status: DNR Family Communication: Updated patient.  No family at bedside. Disposition Plan: Likely home once acute respiratory issues have improved and close to baseline hopefully the next 24-48 hours.   Consultants:   Gastroenterology: [name] Plan 12/17/2016      Procedures:   Upper endoscopy 12/17/2016  CT abdomen and pelvis 12/16/2016  Chest x-ray 12/16/2016, 12/17/2016, 12/18/2016  Antimicrobials:   IV cefepime 12/18/2016>>>>>> 12/21/2016  IV vancomycin 12/18/2016>>>>> 12/20/2016  Augmentin 12/21/2016>>>>> 12/22/2016  IV Rocephin 12/22/2016   Subjective: Patient states had episode of emesis last night and this morning.  Patient complaining of abdominal pain.  Shortness of breath improved.   Objective: Vitals:   12/21/16 2051 12/21/16 2116 12/22/16 0520 12/22/16 1011  BP:  115/68 (!) 107/56 125/76  Pulse:  100 91   Resp:  18 20   Temp:  98.3 F (36.8 C) 99.2 F (37.3 C)   TempSrc:  Oral Oral   SpO2: 92% 96% 95%   Weight:   54.2 kg (119 lb 8 oz)   Height:        Intake/Output Summary (Last 24 hours) at 12/22/2016 1154 Last data filed at 12/22/2016 1015 Gross per 24 hour  Intake 240 ml  Output 500 ml  Net -260 ml   Filed Weights   12/20/16 0500 12/21/16 0500 12/22/16 0520  Weight: 55.6 kg (122 lb 9.2 oz) 54.4 kg (119 lb 14.4 oz) 54.2 kg (119 lb 8 oz)    Examination:  General exam: NAD Respiratory system: Lungs clear to auscultation bilaterally.  Crackles improved.  No wheezing.  No rhonchi.  Respiratory effort normal. Cardiovascular system: RRR no m/r/g. No pedal edema. Gastrointestinal system:  Abdomen is soft nontender, nondistended, positive bowel sounds.  No hepatosplenomegaly. Central nervous system: Alert and oriented. No focal neurological deficits. Extremities: Symmetric 5 x 5 power. Skin: No rashes, lesions or ulcers Psychiatry: Judgement and insight appear fair. Mood & affect appropriate.     Data Reviewed: I have personally reviewed following labs and imaging studies  CBC: Recent Labs  Lab 12/17/16 0650  12/18/16 1342 12/19/16 0420 12/20/16 0425 12/21/16 0421 12/22/16 0349  WBC 4.3  --   --  2.2* 4.7 3.9* 3.5*  NEUTROABS  --   --   --  2.0 4.1 3.1 2.4  HGB 7.9*   < > 7.9* 7.3* 7.7* 8.6* 8.3*  HCT 24.0*   < > 24.0* 21.7* 22.4* 26.3* 24.7*  MCV 109.1*  --   --  105.9* 105.2* 106.5* 105.1*  PLT 162  --   --  108* 87* 80* 62*   < > = values in this interval not displayed.   Basic Metabolic Panel: Recent Labs  Lab 12/18/16 0453 12/19/16 0420 12/20/16 0425 12/21/16 0421 12/22/16 0349  NA 139 136 141 138 136  K 3.5 4.2 4.0 3.6 3.8  CL 109 105 103 95* 94*  CO2 25 25 28  32 31  GLUCOSE 98 183* 98 87 94  BUN 11 10 14 17  21*  CREATININE 1.31* 1.27*  1.25* 1.39* 1.41*  CALCIUM 8.5* 8.8* 9.3 9.3 9.2  MG 1.7 2.3  --   --   --    GFR: Estimated Creatinine Clearance: 44.3 mL/min (A) (by C-G formula based on SCr of 1.41 mg/dL (H)). Liver Function Tests: Recent Labs  Lab 12/16/16 1138  AST 51*  ALT 34  ALKPHOS 90  BILITOT 0.9  PROT 8.0  ALBUMIN 3.6   Recent Labs  Lab 12/16/16 1138  LIPASE 20   No results for input(s): AMMONIA in the last 168 hours. Coagulation Profile: No results for input(s): INR, PROTIME in the last 168 hours. Cardiac Enzymes: No results for input(s): CKTOTAL, CKMB, CKMBINDEX, TROPONINI in the last 168 hours. BNP (last 3 results) No results for input(s): PROBNP in the last 8760 hours. HbA1C: No results for input(s): HGBA1C in the last 72 hours. CBG: No results for input(s): GLUCAP in the last 168 hours. Lipid Profile: No  results for input(s): CHOL, HDL, LDLCALC, TRIG, CHOLHDL, LDLDIRECT in the last 72 hours. Thyroid Function Tests: No results for input(s): TSH, T4TOTAL, FREET4, T3FREE, THYROIDAB in the last 72 hours. Anemia Panel: No results for input(s): VITAMINB12, FOLATE, FERRITIN, TIBC, IRON, RETICCTPCT in the last 72 hours. Sepsis Labs: Recent Labs  Lab 12/16/16 1155  LATICACIDVEN 0.95    Recent Results (from the past 240 hour(s))  Culture, blood (routine x 2) Call MD if unable to obtain prior to antibiotics being given     Status: None (Preliminary result)   Collection Time: 12/18/16  8:19 PM  Result Value Ref Range Status   Specimen Description BLOOD LEFT ANTECUBITAL  Final   Special Requests   Final    BOTTLES DRAWN AEROBIC ONLY Blood Culture adequate volume   Culture   Final    NO GROWTH 3 DAYS Performed at [facility], [address] [place], [place] [address]    Report Status PENDING  Incomplete  Culture, blood (routine x 2) Call MD if unable to obtain prior to antibiotics being given     Status: None (Preliminary result)   Collection Time: 12/18/16  8:27 PM  Result Value Ref Range Status   Specimen Description BLOOD LEFT ANTECUBITAL  Final   Special Requests   Final    BOTTLES DRAWN AEROBIC AND ANAEROBIC Blood Culture adequate volume   Culture   Final    NO GROWTH 3 DAYS Performed at [facility], [place] [address] [place], [place] [address]    Report Status PENDING  Incomplete  MRSA PCR Screening     Status: None   Collection Time: 12/19/16  4:43 PM  Result Value Ref Range Status   MRSA by PCR NEGATIVE NEGATIVE Final    Comment:        The GeneXpert MRSA Assay (FDA approved for NASAL specimens only), is one component of a comprehensive MRSA colonization surveillance program. It is not intended to diagnose MRSA infection nor to guide or monitor treatment for MRSA infections.   Culture, sputum-assessment     Status: None   Collection Time: 12/20/16  5:39 AM   Result Value Ref Range Status   Specimen Description SPUTUM  Final   Special Requests NONE  Final   Sputum evaluation THIS SPECIMEN IS ACCEPTABLE FOR SPUTUM CULTURE  Final   Report Status 12/20/2016 FINAL  Final  Culture, respiratory (NON-Expectorated)     Status: None   Collection Time: 12/20/16  5:39 AM  Result Value Ref Range Status   Specimen Description SPUTUM  Final   Special Requests NONE Reflexed from [id number]  Final   Gram Stain   Final    ABUNDANT WBC PRESENT,BOTH PMN AND MONONUCLEAR MODERATE GRAM POSITIVE COCCI IN PAIRS IN CLUSTERS FEW GRAM POSITIVE COCCI IN CHAINS MODERATE GRAM VARIABLE ROD FEW YEAST    Culture   Final    Consistent with normal respiratory flora. Performed at [facility], [place] [address], [place], [place] [address]    Report Status 12/22/2016 FINAL  Final         Radiology Studies: No results found.      Scheduled Meds: . amLODipine  10 mg Oral Daily  . amoxicillin-clavulanate  1 tablet Oral Q12H  . arformoterol  15 mcg Nebulization BID  . budesonide (PULMICORT) nebulizer solution  0.5 mg Nebulization BID  . furosemide  40 mg Oral Daily  . guaiFENesin  1,200 mg Oral BID  . HYDROcodone-homatropine  5 mL Oral TID  . ipratropium-albuterol  3 mL Nebulization TID  . loratadine  10 mg Oral Daily  . multivitamin with minerals  1 tablet Oral Daily  . pantoprazole  40 mg Oral BID   Continuous Infusions:    LOS: 6 days    Time spent: 35 mins    Irine Seal, MD [facility] Pager [phone]  If 7PM-7AM, please contact night-coverage [email or web address] Password [facility] 12/22/2016, 11:54 AM

## 2016-12-23 ENCOUNTER — Inpatient Hospital Stay (HOSPITAL_COMMUNITY): Payer: Medicaid Other

## 2016-12-23 ENCOUNTER — Encounter (HOSPITAL_COMMUNITY): Payer: Self-pay

## 2016-12-23 LAB — BASIC METABOLIC PANEL
Anion gap: 9 (ref 5–15)
BUN: 21 mg/dL — ABNORMAL HIGH (ref 6–20)
CALCIUM: 9.3 mg/dL (ref 8.9–10.3)
CO2: 31 mmol/L (ref 22–32)
CREATININE: 1.37 mg/dL — AB (ref 0.61–1.24)
Chloride: 97 mmol/L — ABNORMAL LOW (ref 101–111)
GFR, EST NON AFRICAN AMERICAN: 56 mL/min — AB (ref >60)
GLUCOSE: 90 mg/dL (ref 65–99)
Potassium: 3.9 mmol/L (ref 3.5–5.1)
Sodium: 137 mmol/L (ref 135–145)

## 2016-12-23 LAB — CULTURE, BLOOD (ROUTINE X 2)
Culture: NO GROWTH
Culture: NO GROWTH
SPECIAL REQUESTS: ADEQUATE
SPECIAL REQUESTS: ADEQUATE

## 2016-12-23 LAB — CBC WITH DIFFERENTIAL/PLATELET
BASOS ABS: 0 10*3/uL (ref 0.0–0.1)
BASOS PCT: 0 %
EOS ABS: 0.1 10*3/uL (ref 0.0–0.7)
Eosinophils Relative: 3 %
HCT: 24.5 % — ABNORMAL LOW (ref 39.0–52.0)
Hemoglobin: 8.3 g/dL — ABNORMAL LOW (ref 13.0–17.0)
Lymphocytes Relative: 9 %
Lymphs Abs: 0.3 10*3/uL — ABNORMAL LOW (ref 0.7–4.0)
MCH: 35.3 pg — ABNORMAL HIGH (ref 26.0–34.0)
MCHC: 33.9 g/dL (ref 30.0–36.0)
MCV: 104.3 fL — ABNORMAL HIGH (ref 78.0–100.0)
MONO ABS: 1 10*3/uL (ref 0.1–1.0)
MONOS PCT: 26 %
Neutro Abs: 2.3 10*3/uL (ref 1.7–7.7)
Neutrophils Relative %: 62 %
PLATELETS: 40 10*3/uL — AB (ref 150–400)
RBC: 2.35 MIL/uL — ABNORMAL LOW (ref 4.22–5.81)
RDW: 14.9 % (ref 11.5–15.5)
WBC: 3.6 10*3/uL — ABNORMAL LOW (ref 4.0–10.5)

## 2016-12-23 LAB — HEPATIC FUNCTION PANEL
ALT: 256 U/L — ABNORMAL HIGH (ref 17–63)
AST: 348 U/L — ABNORMAL HIGH (ref 15–41)
Albumin: 3.2 g/dL — ABNORMAL LOW (ref 3.5–5.0)
Alkaline Phosphatase: 170 U/L — ABNORMAL HIGH (ref 38–126)
BILIRUBIN DIRECT: 0.1 mg/dL (ref 0.1–0.5)
BILIRUBIN INDIRECT: 0.5 mg/dL (ref 0.3–0.9)
BILIRUBIN TOTAL: 0.6 mg/dL (ref 0.3–1.2)
Total Protein: 8 g/dL (ref 6.5–8.1)

## 2016-12-23 LAB — LACTATE DEHYDROGENASE: LDH: 337 U/L — ABNORMAL HIGH (ref 98–192)

## 2016-12-23 LAB — SAVE SMEAR

## 2016-12-23 LAB — PATHOLOGIST SMEAR REVIEW

## 2016-12-23 MED ORDER — MENTHOL 3 MG MT LOZG
1.0000 | LOZENGE | OROMUCOSAL | Status: DC | PRN
  Filled 2016-12-23: qty 9

## 2016-12-23 MED ORDER — GI COCKTAIL ~~LOC~~: 30.0000 mL | Freq: Three times a day (TID) | ORAL | Status: DC | PRN

## 2016-12-23 MED ORDER — DM-GUAIFENESIN ER 30-600 MG PO TB12
2.0000 | ORAL_TABLET | Freq: Two times a day (BID) | ORAL | Status: DC
  Administered 2016-12-23 – 2016-12-24 (×2): 2 via ORAL
  Filled 2016-12-23 (×2): qty 2
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
## 2016-12-23 NOTE — Progress Notes (Signed)
Encouraged patient to walk in the hall to assess his breathing with ambulation in the hall, patient refused and stated she don't feel like walking at the moment maybe later.

## 2016-12-23 NOTE — Care Management Note (Signed)
Case Management Note  Patient Details  Name: [name] MRN: [id number] Date of Birth: 1960/12/18  Subjective/Objective: Patient plans to return back to [employer], will need bus pass-CSW managing. No further CM needs.                   Action/Plan:d/c homeless shelter   Expected Discharge Date:  12/18/16               Expected Discharge Plan:  Homeless Shelter  In-House Referral:  Clinical Social Work  Discharge planning Services  CM Consult, [facility]  Post Acute Care Choice:    Choice offered to:     DME Arranged:    DME Agency:     HH Arranged:    [place] Agency:     Status of Service:  Completed, signed off  If discussed at [employer], dates discussed:    Additional Comments:  Dessa Phi, RN 12/23/2016, 12:00 PM

## 2016-12-24 DIAGNOSIS — E8770 Fluid overload, unspecified: Secondary | ICD-10-CM

## 2016-12-24 DIAGNOSIS — R110 Nausea: Secondary | ICD-10-CM

## 2016-12-24 DIAGNOSIS — R0602 Shortness of breath: Secondary | ICD-10-CM

## 2016-12-24 LAB — CBC
HEMATOCRIT: 25.4 % — AB (ref 39.0–52.0)
Hemoglobin: 8.6 g/dL — ABNORMAL LOW (ref 13.0–17.0)
MCH: 35.1 pg — ABNORMAL HIGH (ref 26.0–34.0)
MCHC: 33.9 g/dL (ref 30.0–36.0)
MCV: 103.7 fL — ABNORMAL HIGH (ref 78.0–100.0)
Platelets: 41 10*3/uL — ABNORMAL LOW (ref 150–400)
RBC: 2.45 MIL/uL — ABNORMAL LOW (ref 4.22–5.81)
RDW: 14.7 % (ref 11.5–15.5)
WBC: 4.3 10*3/uL (ref 4.0–10.5)

## 2016-12-24 LAB — BASIC METABOLIC PANEL
ANION GAP: 11 (ref 5–15)
BUN: 20 mg/dL (ref 6–20)
CALCIUM: 9.2 mg/dL (ref 8.9–10.3)
CO2: 30 mmol/L (ref 22–32)
CREATININE: 1.52 mg/dL — AB (ref 0.61–1.24)
Chloride: 95 mmol/L — ABNORMAL LOW (ref 101–111)
GFR, EST AFRICAN AMERICAN: 57 mL/min — AB (ref >60)
GFR, EST NON AFRICAN AMERICAN: 49 mL/min — AB (ref >60)
Glucose, Bld: 96 mg/dL (ref 65–99)
Potassium: 3.9 mmol/L (ref 3.5–5.1)
SODIUM: 136 mmol/L (ref 135–145)

## 2016-12-24 LAB — HAPTOGLOBIN: HAPTOGLOBIN: 259 mg/dL — AB (ref 34–200)

## 2016-12-24 MED ORDER — LORATADINE 10 MG PO TABS: 10.0000 mg | ORAL_TABLET | Freq: Every day | ORAL | 0 refills | Status: DC

## 2016-12-24 MED ORDER — HEPARIN SOD (PORK) LOCK FLUSH 100 UNIT/ML IV SOLN
500.0000 [IU] | INTRAVENOUS | Status: AC | PRN
  Administered 2016-12-24: 500 [IU]

## 2016-12-24 MED ORDER — DEXTROSE 5 % IV SOLN
2.0000 g | INTRAVENOUS | Status: AC
  Administered 2016-12-24: 2 g via INTRAVENOUS
  Filled 2016-12-24: qty 2

## 2016-12-24 MED ORDER — MOMETASONE FURO-FORMOTEROL FUM 100-5 MCG/ACT IN AERO: 2.0000 | INHALATION_SPRAY | Freq: Two times a day (BID) | RESPIRATORY_TRACT | 0 refills | Status: DC

## 2016-12-24 MED ORDER — DM-GUAIFENESIN ER 30-600 MG PO TB12: 2.0000 | ORAL_TABLET | Freq: Two times a day (BID) | ORAL | 0 refills | Status: AC

## 2016-12-24 MED FILL — COMBIVENT RESPIMAT INHAL SP: 20-100 | 30 days supply | Qty: 4 | Fill #1

## 2016-12-24 NOTE — Care Management Note (Signed)
Case Management Note  Patient Details  Name: CROCKETT RALLO MRN: [id number] Date of Birth: [birth year]  Subjective/Objective:  Once aware of d/c scripts will call [facility] to insure they will have meds so patient can pick them up. CSW will provide bus pass for transp homeless shelter[facility].                  Action/Plan:d/c homeless shelter   Expected Discharge Date:  12/18/16               Expected Discharge Plan:  Homeless Shelter  In-House Referral:  Clinical Social Work  Discharge planning Services  CM Consult, [facility], Medication Assistance  Post Acute Care Choice:    Choice offered to:     DME Arranged:    DME Agency:     HH Arranged:    [place] Agency:     Status of Service:  Completed, signed off  If discussed at [employer], dates discussed:    Additional Comments:  Dessa Phi, RN 12/24/2016, 11:47 AM

## 2016-12-24 NOTE — Progress Notes (Signed)
Pt stating that he is ready to be discharged.  Education provided that RN staff is still waiting for d/c order and paperwork.  Pt stated that he did not want to wait for that and he was ready to go.  Education and encouragement provided by RN for patient to stay and wait for MD to prepare and finalize paperwork.  Pt stated that he was not going to stay and wait.  RN educated that if patient choose not to wait he would be leaving AMA.  Pt refused to sign AMA paperwork.  Pt then got on elevator stating that he was going to get something to eat and would be back.  Encouraged patient to order from room and patient stated that he did not want anything from down there.

## 2016-12-24 NOTE — Progress Notes (Signed)
Spoke to [name]-patient uses their pharmacy for his meds-covered meds:albuterol inhaler, dulera, & spiriva-MD updated. No further CM needs.
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
## 2016-12-24 NOTE — Care Management Note (Signed)
Case Management Note  Patient Details  Name: [name] MRN: [id number] Date of Birth: 1960-11-20  Subjective/Objective: Provided patient w/[facility] otpt Pharmacy address to take his scripts-patient voiced understanding. He has medicaid, & understands his obligation for the co pay $3. No further CM needs.                   Action/Plan:d/c homeless shelter.   Expected Discharge Date:  12/18/16               Expected Discharge Plan:  Homeless Shelter  In-House Referral:  Clinical Social Work  Discharge planning Services  CM Consult, [facility], Medication Assistance  Post Acute Care Choice:    Choice offered to:     DME Arranged:    DME Agency:     HH Arranged:    [place] Agency:     Status of Service:  Completed, signed off  If discussed at [employer], dates discussed:    Additional Comments:  Dessa Phi, RN 12/24/2016, 1:14 PM

## 2016-12-26 ENCOUNTER — Telehealth: Payer: Self-pay | Admitting: Internal Medicine

## 2016-12-26 MED FILL — DULERA 100 MCG/5 MCG INH: 100-5 | 30 days supply | Qty: 13 | Fill #0

## 2016-12-26 MED FILL — CLEAR-ATADINE 10 MG TABLET: 10 | 20 days supply | Qty: 20 | Fill #0

## 2016-12-26 MED FILL — ONDANSETRON ODT 4 MG TABLET: 4 | 7 days supply | Qty: 20 | Fill #0

## 2016-12-26 MED FILL — PANTOPRAZOLE SOD DR 40 MG T: 40 | 30 days supply | Qty: 60 | Fill #0

## 2016-12-26 NOTE — Telephone Encounter (Signed)
Patient came to my desk wanting an appointment with Dr. Tommy Medal - spoke with Dr. Tommy Medal, he only wanted for him to come in for labs tomorrow and continue with his f/u that was already previously scheduled.

## 2016-12-27 ENCOUNTER — Other Ambulatory Visit (HOSPITAL_BASED_OUTPATIENT_CLINIC_OR_DEPARTMENT_OTHER): Payer: Medicaid Other

## 2016-12-27 ENCOUNTER — Other Ambulatory Visit: Payer: Self-pay | Admitting: Internal Medicine

## 2016-12-27 DIAGNOSIS — C3492 Malignant neoplasm of unspecified part of left bronchus or lung: Secondary | ICD-10-CM

## 2016-12-27 LAB — COMPREHENSIVE METABOLIC PANEL
ALT: 177 U/L — AB (ref 0–55)
ANION GAP: 13 meq/L — AB (ref 3–11)
AST: 180 U/L (ref 5–34)
Albumin: 3.2 g/dL — ABNORMAL LOW (ref 3.5–5.0)
Alkaline Phosphatase: 185 U/L — ABNORMAL HIGH (ref 40–150)
BILIRUBIN TOTAL: 0.44 mg/dL (ref 0.20–1.20)
BUN: 17.4 mg/dL (ref 7.0–26.0)
CHLORIDE: 96 meq/L — AB (ref 98–109)
CO2: 27 meq/L (ref 22–29)
CREATININE: 1.5 mg/dL — AB (ref 0.7–1.3)
Calcium: 9.5 mg/dL (ref 8.4–10.4)
EGFR: >60 mL/min/{1.73_m2} (ref >60)
GLUCOSE: 89 mg/dL (ref 70–140)
Potassium: 3.5 mEq/L (ref 3.5–5.1)
SODIUM: 137 meq/L (ref 136–145)
TOTAL PROTEIN: 8.1 g/dL (ref 6.4–8.3)

## 2016-12-27 LAB — CBC WITH DIFFERENTIAL/PLATELET
BASO%: 0.2 % (ref 0.0–2.0)
BASOS ABS: 0 10*3/uL (ref 0.0–0.1)
EOS ABS: 0.1 10*3/uL (ref 0.0–0.5)
EOS%: 0.9 % (ref 0.0–7.0)
HCT: 25.5 % — ABNORMAL LOW (ref 38.4–49.9)
HEMOGLOBIN: 8.5 g/dL — AB (ref 13.0–17.1)
LYMPH#: 1.4 10*3/uL (ref 0.9–3.3)
LYMPH%: 24.2 % (ref 14.0–49.0)
MCH: 34.6 pg — AB (ref 27.2–33.4)
MCHC: 33.3 g/dL (ref 32.0–36.0)
MCV: 103.7 fL — AB (ref 79.3–98.0)
MONO#: 1.1 10*3/uL — ABNORMAL HIGH (ref 0.1–0.9)
MONO%: 20.3 % — AB (ref 0.0–14.0)
NEUT#: 3 10*3/uL (ref 1.5–6.5)
NEUT%: 54.4 % (ref 39.0–75.0)
NRBC: 0 % (ref 0–0)
PLATELETS: 84 10*3/uL — AB (ref 140–400)
RBC: 2.46 10*6/uL — ABNORMAL LOW (ref 4.20–5.82)
RDW: 14.9 % — AB (ref 11.0–14.6)
WBC: 5.6 10*3/uL (ref 4.0–10.3)

## 2016-12-27 LAB — TECHNOLOGIST REVIEW

## 2016-12-28 ENCOUNTER — Telehealth: Payer: Self-pay | Admitting: Internal Medicine

## 2016-12-28 NOTE — Telephone Encounter (Signed)
Called patient regarding appointment

## 2016-12-30 ENCOUNTER — Other Ambulatory Visit: Payer: Self-pay

## 2016-12-30 ENCOUNTER — Emergency Department (HOSPITAL_COMMUNITY): Payer: Medicaid Other

## 2016-12-30 ENCOUNTER — Inpatient Hospital Stay (HOSPITAL_COMMUNITY)
Admission: EM | Admit: 2016-12-30 | Discharge: 2017-01-07 | DRG: 180 | Disposition: A | Discharge status: Nursing Home (Includes ICF) | Payer: Medicaid Other | Attending: Family Medicine | Admitting: Family Medicine

## 2016-12-30 ENCOUNTER — Encounter (HOSPITAL_COMMUNITY): Payer: Self-pay

## 2016-12-30 DIAGNOSIS — J441 Chronic obstructive pulmonary disease with (acute) exacerbation: Secondary | ICD-10-CM | POA: Diagnosis present

## 2016-12-30 DIAGNOSIS — C3492 Malignant neoplasm of unspecified part of left bronchus or lung: Principal | ICD-10-CM | POA: Diagnosis present

## 2016-12-30 DIAGNOSIS — R109 Unspecified abdominal pain: Secondary | ICD-10-CM | POA: Diagnosis present

## 2016-12-30 DIAGNOSIS — Z923 Personal history of irradiation: Secondary | ICD-10-CM

## 2016-12-30 DIAGNOSIS — Z5111 Encounter for antineoplastic chemotherapy: Secondary | ICD-10-CM

## 2016-12-30 DIAGNOSIS — R0602 Shortness of breath: Secondary | ICD-10-CM

## 2016-12-30 DIAGNOSIS — R0609 Other forms of dyspnea: Secondary | ICD-10-CM | POA: Diagnosis not present

## 2016-12-30 DIAGNOSIS — R1013 Epigastric pain: Secondary | ICD-10-CM

## 2016-12-30 DIAGNOSIS — I5033 Acute on chronic diastolic (congestive) heart failure: Secondary | ICD-10-CM | POA: Diagnosis present

## 2016-12-30 DIAGNOSIS — D649 Anemia, unspecified: Secondary | ICD-10-CM | POA: Diagnosis not present

## 2016-12-30 DIAGNOSIS — K5900 Constipation, unspecified: Secondary | ICD-10-CM | POA: Diagnosis present

## 2016-12-30 DIAGNOSIS — K2950 Unspecified chronic gastritis without bleeding: Secondary | ICD-10-CM | POA: Diagnosis present

## 2016-12-30 DIAGNOSIS — Z7951 Long term (current) use of inhaled steroids: Secondary | ICD-10-CM

## 2016-12-30 DIAGNOSIS — J90 Pleural effusion, not elsewhere classified: Secondary | ICD-10-CM

## 2016-12-30 DIAGNOSIS — Z590 Homelessness: Secondary | ICD-10-CM

## 2016-12-30 DIAGNOSIS — K319 Disease of stomach and duodenum, unspecified: Secondary | ICD-10-CM | POA: Diagnosis present

## 2016-12-30 DIAGNOSIS — G40909 Epilepsy, unspecified, not intractable, without status epilepticus: Secondary | ICD-10-CM | POA: Diagnosis present

## 2016-12-30 DIAGNOSIS — K2970 Gastritis, unspecified, without bleeding: Secondary | ICD-10-CM | POA: Diagnosis present

## 2016-12-30 DIAGNOSIS — E43 Unspecified severe protein-calorie malnutrition: Secondary | ICD-10-CM

## 2016-12-30 DIAGNOSIS — N179 Acute kidney failure, unspecified: Secondary | ICD-10-CM | POA: Diagnosis present

## 2016-12-30 DIAGNOSIS — R202 Paresthesia of skin: Secondary | ICD-10-CM | POA: Diagnosis present

## 2016-12-30 DIAGNOSIS — Z79899 Other long term (current) drug therapy: Secondary | ICD-10-CM

## 2016-12-30 DIAGNOSIS — I110 Hypertensive heart disease with heart failure: Secondary | ICD-10-CM | POA: Diagnosis present

## 2016-12-30 DIAGNOSIS — J449 Chronic obstructive pulmonary disease, unspecified: Secondary | ICD-10-CM | POA: Diagnosis present

## 2016-12-30 DIAGNOSIS — D630 Anemia in neoplastic disease: Secondary | ICD-10-CM | POA: Diagnosis present

## 2016-12-30 DIAGNOSIS — Z681 Body mass index (BMI) 19 or less, adult: Secondary | ICD-10-CM

## 2016-12-30 DIAGNOSIS — J9601 Acute respiratory failure with hypoxia: Secondary | ICD-10-CM | POA: Diagnosis present

## 2016-12-30 DIAGNOSIS — Z66 Do not resuscitate: Secondary | ICD-10-CM | POA: Diagnosis present

## 2016-12-30 DIAGNOSIS — C7951 Secondary malignant neoplasm of bone: Secondary | ICD-10-CM

## 2016-12-30 DIAGNOSIS — Z809 Family history of malignant neoplasm, unspecified: Secondary | ICD-10-CM

## 2016-12-30 DIAGNOSIS — Z9889 Other specified postprocedural states: Secondary | ICD-10-CM

## 2016-12-30 DIAGNOSIS — I10 Essential (primary) hypertension: Secondary | ICD-10-CM | POA: Diagnosis present

## 2016-12-30 DIAGNOSIS — T502X5A Adverse effect of carbonic-anhydrase inhibitors, benzothiadiazides and other diuretics, initial encounter: Secondary | ICD-10-CM | POA: Diagnosis present

## 2016-12-30 DIAGNOSIS — Z720 Tobacco use: Secondary | ICD-10-CM | POA: Diagnosis present

## 2016-12-30 LAB — CBC
HEMATOCRIT: 21.5 % — AB (ref 39.0–52.0)
HEMOGLOBIN: 7.5 g/dL — AB (ref 13.0–17.0)
MCH: 36.6 pg — AB (ref 26.0–34.0)
MCHC: 34.9 g/dL (ref 30.0–36.0)
MCV: 104.9 fL — AB (ref 78.0–100.0)
Platelets: 234 10*3/uL (ref 150–400)
RBC: 2.05 MIL/uL — AB (ref 4.22–5.81)
RDW: 16.2 % — ABNORMAL HIGH (ref 11.5–15.5)
WBC: 7.5 10*3/uL (ref 4.0–10.5)

## 2016-12-30 LAB — COMPREHENSIVE METABOLIC PANEL
ALBUMIN: 2.9 g/dL — AB (ref 3.5–5.0)
ALK PHOS: 147 U/L — AB (ref 38–126)
ALT: 63 U/L (ref 17–63)
AST: 50 U/L — AB (ref 15–41)
Anion gap: 6 (ref 5–15)
BILIRUBIN TOTAL: 0.4 mg/dL (ref 0.3–1.2)
BUN: 7 mg/dL (ref 6–20)
CALCIUM: 8.6 mg/dL — AB (ref 8.9–10.3)
CO2: 27 mmol/L (ref 22–32)
Chloride: 103 mmol/L (ref 101–111)
Creatinine, Ser: 1.04 mg/dL (ref 0.61–1.24)
GFR calc Af Amer: >60 mL/min (ref >60)
GFR calc non Af Amer: >60 mL/min (ref >60)
GLUCOSE: 89 mg/dL (ref 65–99)
POTASSIUM: 3.2 mmol/L — AB (ref 3.5–5.1)
Sodium: 136 mmol/L (ref 135–145)
TOTAL PROTEIN: 7 g/dL (ref 6.5–8.1)

## 2016-12-30 LAB — PREPARE RBC (CROSSMATCH)

## 2016-12-30 LAB — URINALYSIS, ROUTINE W REFLEX MICROSCOPIC
Bilirubin Urine: NEGATIVE
GLUCOSE, UA: NEGATIVE mg/dL
HGB URINE DIPSTICK: NEGATIVE
KETONES UR: NEGATIVE mg/dL
Leukocytes, UA: NEGATIVE
Nitrite: NEGATIVE
PH: 7 (ref 5.0–8.0)
PROTEIN: NEGATIVE mg/dL
Specific Gravity, Urine: 1.005 (ref 1.005–1.030)

## 2016-12-30 LAB — I-STAT TROPONIN, ED: Troponin i, poc: 0 ng/mL (ref 0.00–0.08)

## 2016-12-30 LAB — MAGNESIUM: MAGNESIUM: 1.7 mg/dL (ref 1.7–2.4)

## 2016-12-30 LAB — POC OCCULT BLOOD, ED: Fecal Occult Bld: NEGATIVE

## 2016-12-30 LAB — LIPASE, BLOOD: Lipase: 20 U/L (ref 11–51)

## 2016-12-30 LAB — BRAIN NATRIURETIC PEPTIDE: B Natriuretic Peptide: 13.4 pg/mL (ref 0.0–100.0)

## 2016-12-30 MED ORDER — LORATADINE 10 MG PO TABS
10.0000 mg | ORAL_TABLET | Freq: Every day | ORAL | Status: DC
Start: 2016-12-31 — End: 2017-01-07
  Administered 2016-12-31 – 2017-01-07 (×8): 10 mg via ORAL
  Filled 2016-12-30 (×8): qty 1

## 2016-12-30 MED ORDER — IPRATROPIUM-ALBUTEROL 0.5-2.5 (3) MG/3ML IN SOLN: 3.0000 mL | Freq: Four times a day (QID) | RESPIRATORY_TRACT | Status: DC

## 2016-12-30 MED ORDER — MORPHINE SULFATE 15 MG PO TABS
15.0000 mg | ORAL_TABLET | ORAL | Status: DC | PRN
  Administered 2016-12-31 – 2017-01-07 (×26): 15 mg via ORAL
  Filled 2016-12-30 (×27): qty 1

## 2016-12-30 MED ORDER — ONDANSETRON 4 MG PO TBDP: 4.0000 mg | ORAL_TABLET | Freq: Three times a day (TID) | ORAL | Status: DC | PRN

## 2016-12-30 MED ORDER — GABAPENTIN 300 MG PO CAPS
300.0000 mg | ORAL_CAPSULE | Freq: Every day | ORAL | Status: DC
  Administered 2016-12-30 – 2017-01-06 (×7): 300 mg via ORAL
  Filled 2016-12-30 (×8): qty 1

## 2016-12-30 MED ORDER — ENOXAPARIN SODIUM 40 MG/0.4ML ~~LOC~~ SOLN
40.0000 mg | Freq: Every day | SUBCUTANEOUS | Status: DC
  Administered 2016-12-30: 40 mg via SUBCUTANEOUS
  Filled 2016-12-30 (×2): qty 0.4

## 2016-12-30 MED ORDER — ACETAMINOPHEN 500 MG PO TABS
500.0000 mg | ORAL_TABLET | Freq: Two times a day (BID) | ORAL | Status: DC | PRN
  Filled 2016-12-30: qty 1

## 2016-12-30 MED ORDER — MOMETASONE FURO-FORMOTEROL FUM 100-5 MCG/ACT IN AERO
2.0000 | INHALATION_SPRAY | Freq: Two times a day (BID) | RESPIRATORY_TRACT | Status: DC
  Administered 2016-12-31 – 2017-01-01 (×2): 2 via RESPIRATORY_TRACT
  Filled 2016-12-30 (×2): qty 8.8

## 2016-12-30 MED ORDER — SODIUM CHLORIDE 0.9 % IV SOLN
Freq: Once | INTRAVENOUS | Status: AC
  Administered 2016-12-30: via INTRAVENOUS

## 2016-12-30 MED ORDER — PANTOPRAZOLE SODIUM 40 MG PO TBEC
40.0000 mg | DELAYED_RELEASE_TABLET | Freq: Two times a day (BID) | ORAL | Status: DC
  Administered 2016-12-31 – 2017-01-07 (×14): 40 mg via ORAL
  Filled 2016-12-30 (×15): qty 1

## 2016-12-30 MED ORDER — IPRATROPIUM-ALBUTEROL 0.5-2.5 (3) MG/3ML IN SOLN
3.0000 mL | Freq: Four times a day (QID) | RESPIRATORY_TRACT | Status: DC
  Administered 2016-12-31 (×4): 3 mL via RESPIRATORY_TRACT
  Filled 2016-12-30 (×4): qty 3

## 2016-12-30 MED ORDER — POTASSIUM CHLORIDE CRYS ER 20 MEQ PO TBCR
40.0000 meq | EXTENDED_RELEASE_TABLET | Freq: Once | ORAL | Status: AC
  Administered 2016-12-30: 40 meq via ORAL
  Filled 2016-12-30: qty 2

## 2016-12-30 MED ORDER — AMLODIPINE BESYLATE 10 MG PO TABS
10.0000 mg | ORAL_TABLET | Freq: Every day | ORAL | Status: DC
  Administered 2016-12-31 – 2017-01-07 (×8): 10 mg via ORAL
  Filled 2016-12-30 (×9): qty 1

## 2016-12-30 NOTE — ED Provider Notes (Signed)
[facility] DEPT Provider Note   CSN: [id number] Arrival date & time: 12/30/16  1424     History   Chief Complaint Chief Complaint  Patient presents with  . Chest Pain  . Shortness of Breath  . Abdominal Pain    HPI [name] is a 56 y.o. male.  56 year old male with a long-standing history of stage IV lung cancer, hypertension, seizures, COPD, and upper GI bleeding presents with complaint of shortness of breath.  Patient with recent admission to [facility] for similar complaints.  He presents now complaining of one week of fairly persistent shortness of breath especially with exertion.  He denies fever.  He denies worsening of his chronic shortness of breath.  Patient also denies chest pain.   Of note, patient is homeless. In the last 24 hours more than one foot of snow has fallen in [place].      The history is provided by the patient and medical records.  Shortness of Breath  This is a chronic problem. The problem occurs frequently.The current episode started more than 1 week ago. The problem has not changed since onset.Pertinent negatives include no fever and no abdominal pain. He has tried beta-agonist inhalers for the symptoms. The treatment provided no relief. He has had prior hospitalizations. He has had prior ED visits. Associated medical issues include COPD, pneumonia and heart failure.    Past Medical History:  Diagnosis Date  . Abdominal pain 06/04/2016  . Adenocarcinoma of left lung, stage 4 ([place]) 05/02/2016  . Bronchitis due to tobacco use ([place])   . Cancer ([place])    Lung  . Dehydration 06/04/2016  . Encounter for antineoplastic chemotherapy 05/02/2016  . Goals of care, counseling/discussion 05/02/2016  . HTN (hypertension) 10/30/2016  . Recurrent upper respiratory infection (URI)   . Seizures ([place]) 05/2011   new onset  . Shortness of breath     Patient Active Problem List   Diagnosis Date Noted  . Nausea   .  Hypervolemia   . HCAP (healthcare-associated pneumonia) 12/19/2016  . SOB (shortness of breath) 12/19/2016  . Erosive gastropathy 12/18/2016  . Gastritis 12/18/2016  . Hematemesis 12/16/2016  . Intractable nausea and vomiting 12/16/2016  . HTN (hypertension) 10/30/2016  . Chronic obstructive pulmonary disease ([place]) 09/02/2016  . COPD (chronic obstructive pulmonary disease) ([place]) 08/19/2016  . Dehydration 06/04/2016  . Abdominal pain 06/04/2016  . 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 Adenocarcinoma of left lung, stage 4 ([place]) 05/02/2016  . Goals of care, counseling/discussion 05/02/2016  . Encounter for antineoplastic chemotherapy 05/02/2016  . Tobacco abuse 05/30/2011  . Alcohol abuse 05/30/2011  . Seizure ([place]) 05/28/2011    Past Surgical History:  Procedure Laterality Date  . ESOPHAGOGASTRODUODENOSCOPY (EGD) WITH PROPOFOL N/A 12/17/2016   Procedure: ESOPHAGOGASTRODUODENOSCOPY (EGD) WITH PROPOFOL;  Surgeon: Ladene Artist, MD;  Location: [facility];  Service: Endoscopy;  Laterality: N/A;  . IR FLUORO GUIDE PORT INSERTION RIGHT  05/13/2016  . IR US GUIDE VASC ACCESS RIGHT  05/13/2016  . NO PAST SURGERIES         Home Medications    Prior to Admission medications   Medication Sig Start Date End Date Taking? Authorizing Provider  acetaminophen (TYLENOL) 500 MG tablet Take 500 mg by mouth 2 (two) times daily as needed for mild pain or headache.    [provider]  amLODipine (NORVASC) 10 MG tablet Take 1 tablet (10 mg total) by mouth daily. 10/30/16   Curt Bears, MD  Chlorphen-Phenyleph-ASA (ALKA-SELTZER PLUS COLD PO) Take 2 tablets by mouth at bedtime as needed (COUGH).    [provider]  dexamethasone (DECADRON) 4 MG tablet 4 mg by mouth twice a day the day before, day of and day after the chemotherapy every 3 weeks 09/25/16   Curt Bears, MD  famotidine (PEPCID) 40 MG tablet Take 1 tablet (40 mg total) by mouth daily. 11/18/16   Recardo Evangelist, PA-C  Ipratropium-Albuterol (COMBIVENT) 20-100 MCG/ACT AERS respimat Inhale 1 puff into the lungs every 6 (six) hours. Patient taking differently: Inhale 1 puff into the lungs every 6 (six) hours as needed for wheezing or shortness of breath.  07/22/16   Curt Bears, MD  lidocaine-prilocaine (EMLA) cream Apply 1 application topically as needed. Patient taking differently: Apply 1 application topically as needed (port).  05/08/16   Curt Bears, MD  loratadine (CLARITIN) 10 MG tablet Take 1 tablet (10 mg total) by mouth daily. 12/25/16   Eugenie Filler, MD  mometasone-formoterol (DULERA) 100-5 MCG/ACT AERO Inhale 2 puffs into the lungs 2 (two) times daily. 12/24/16   Eugenie Filler, MD  morphine (MSIR) 15 MG tablet Take 1 tablet (15 mg total) every 4 (four) hours as needed by mouth for severe pain. 12/04/16   Maryanna Shape, NP  ondansetron (ZOFRAN ODT) 4 MG disintegrating tablet Take 1 tablet (4 mg total) by mouth every 8 (eight) hours as needed for nausea or vomiting. 12/18/16   Eugenie Filler, MD  pantoprazole (PROTONIX) 40 MG tablet Take 1 tablet (40 mg total) by mouth 2 (two) times daily. 12/18/16   Eugenie Filler, MD    Family History Family History  Problem Relation Age of Onset  . Cancer Father   . Diabetes Mellitus II Mother     Social History Social History   Tobacco Use  . Smoking status: Current Some Day Smoker    Packs/day: 0.10    Years: 30.00    Pack years: 3.00    Types: Cigarettes  . Smokeless tobacco: Never Used  Substance Use Topics  . Alcohol use: No    Frequency: Never  . Drug use: No     Allergies   Patient has no known allergies.   Review of Systems Review of Systems  Constitutional: Negative for fever.  Respiratory: Positive for shortness of breath.   Gastrointestinal: Negative for abdominal pain.  All other systems reviewed and are negative.    Physical Exam Updated Vital Signs BP (!) 141/85 (BP Location: Left  Arm)   Pulse (!) 109   Temp 98.2 F (36.8 C) (Oral)   Resp 16   Ht 5\' 11"  (1.803 m)   Wt 53.1 kg (117 lb)   SpO2 94%   BMI 16.32 kg/m   Physical Exam  Constitutional: He is oriented to person, place, and time. He appears well-developed and well-nourished. No distress.  HENT:  Head: Normocephalic and atraumatic.  Mouth/Throat: Oropharynx is clear and moist.  Eyes: Conjunctivae and EOM are normal. Pupils are equal, round, and reactive to light.  Neck: Normal range of motion. Neck supple.  Cardiovascular: Normal rate, regular rhythm and normal heart sounds.  Pulmonary/Chest: Effort normal. No stridor. No respiratory distress. He has no wheezes.  Decreased BS at Left Lung Base   Abdominal: Soft. He exhibits no distension. There is no tenderness.  Musculoskeletal: Normal range of motion. He exhibits no edema or deformity.  Neurological: He is alert and oriented to person, place, and time.  Skin:  Skin is warm and dry.  Psychiatric: He has a normal mood and affect.  Nursing note and vitals reviewed.    ED Treatments / Results  Labs (all labs ordered are listed, but only abnormal results are displayed) Labs Reviewed  CBC  COMPREHENSIVE METABOLIC PANEL  LIPASE, BLOOD  BRAIN NATRIURETIC PEPTIDE  I-STAT TROPONIN, ED    EKG  EKG Interpretation  Date/Time:  Monday December 30 2016 14:55:35 EST Ventricular Rate:  99 PR Interval:    QRS Duration: 84 QT Interval:  359 QTC Calculation: 461 R Axis:   96 Text Interpretation:  Sinus tachycardia Atrial premature complex Anterior infarct, old Confirmed by Dene Gentry [phone]) on 12/30/2016 3:09:45 PM       Radiology No results found.  Procedures Procedures (including critical care time)  Medications Ordered in ED Medications - No data to display   Initial Impression / Assessment and Plan / ED Course  I have reviewed the triage vital signs and the nursing notes.  Pertinent labs & imaging results that were available  during my care of the patient were reviewed by me and considered in my medical decision making (see chart for details).     MSE Complete  Patient is presenting with reportedly acute on chronic dyspnea with exertion.  Notable finding on labs include a hemoglobin that has drifted down from 8.6 to 7.5 today.  Stool guaiac is negative.  Patient denies recent blood loss. Suspect that symptomatic anemia may be the cause of patient's reported symptoms.  Patient is homeless and without significant support systems. Doubt pneumonia, CHF, or PE.  Case discussed with the hospitalist service and they will evaluate the patient in the ED for admission.    Final Clinical Impressions(s) / ED Diagnoses   Final diagnoses:  Shortness of breath  Anemia, unspecified type    ED Discharge Orders    None       Valarie Merino, MD 12/30/16 2048

## 2016-12-30 NOTE — H&P (Signed)
History and Physical    [name] IOE:[id number] DOB: October 07, 1960 DOA: 12/30/2016  PCP: Patient, No Pcp Per  Patient coming from: homeless  I have personally briefly reviewed patient's old medical records in [place]  Chief Complaint: DOE  HPI: [name] is [name] 56 y.o. male with medical history significant of stage IV adenocarcinoma of the lung, HTN, seizures, erosive gastropathy and gastritis who presented with several complaints, most pressing was shorntess of breath with exertion.  He notes he was feeling bad and has been feeling bad since discharge.  He notes on Friday, started having worsening DOE.  Couldn't walk across room without being SOB.  He notes this is similar to when he was in the hospital, but it seems to have gotten worse recently.  No orthopnea.  No PND.  No swelling.  No fevers.  He denies hematemesis or blood in stool.  He notes CP with cough.  Also CP last night.  He notes abdominal pain for months, chronic.  He also notes pain in his legs, below the knees bilaterally.  Paresthesias as well.  He ran out of MSIR last week on Thursday.  Was supposed to f/u with Dr. Earlie [name].  ED Course: In the ED, pt had EKG, labs, CXR.  Hemoccult negative. Request admit for symptomatic anemia.  Review of Systems: As per HPI otherwise 10 point review of systems negative.   Past Medical History:  Diagnosis Date  . Abdominal pain 06/04/2016  . Adenocarcinoma of left lung, stage 4 ([place]) 05/02/2016  . Bronchitis due to tobacco use ([place])   . Cancer ([place])    Lung  . Dehydration 06/04/2016  . Encounter for antineoplastic chemotherapy 05/02/2016  . Goals of care, counseling/discussion 05/02/2016  . HTN (hypertension) 10/30/2016  . Recurrent upper respiratory infection (URI)   . Seizures ([place]) 05/2011   new onset  . Shortness of breath     Past Surgical History:  Procedure Laterality Date  . ESOPHAGOGASTRODUODENOSCOPY (EGD) WITH PROPOFOL N/[name] 12/17/2016   Procedure:  ESOPHAGOGASTRODUODENOSCOPY (EGD) WITH PROPOFOL;  Surgeon: Johnathan Artist, MD;  Location: [facility];  Service: Endoscopy;  Laterality: [name];  . IR FLUORO GUIDE PORT INSERTION RIGHT  05/13/2016  . IR US GUIDE VASC ACCESS RIGHT  05/13/2016  . NO PAST SURGERIES       reports that he has been smoking cigarettes.  He has [name] 3.00 pack-year smoking history. he has never used smokeless tobacco. He reports that he does not drink alcohol or use drugs.  No Known Allergies  Family History  Problem Relation Age of Onset  . Cancer Father   . Diabetes Mellitus II Mother    Prior to Admission medications   Medication Sig Start Date End Date Taking? Authorizing Provider  dextromethorphan-guaiFENesin (MUCINEX DM) 30-600 MG 12hr tablet Take 2 tablets by mouth 2 (two) times daily. 5 Days   Yes [provider]  Ipratropium-Albuterol (COMBIVENT) 20-100 MCG/ACT AERS respimat Inhale 1 puff into the lungs every 6 (six) hours. Patient taking differently: Inhale 1 puff into the lungs every 6 (six) hours as needed for wheezing or shortness of breath.  07/22/16  Yes Curt Bears, MD  loratadine (CLARITIN) 10 MG tablet Take 1 tablet (10 mg total) by mouth daily. 12/25/16  Yes Eugenie Filler, MD  mometasone-formoterol (DULERA) 100-5 MCG/ACT AERO Inhale 2 puffs into the lungs 2 (two) times daily. 12/24/16  Yes Eugenie Filler, MD  morphine (MSIR) 15 MG tablet Take 1 tablet (15 mg  total) every 4 (four) hours as needed by mouth for severe pain. 12/04/16  Yes Curcio, Roselie Awkward, NP  pantoprazole (PROTONIX) 40 MG tablet Take 1 tablet (40 mg total) by mouth 2 (two) times daily. 12/18/16  Yes Eugenie Filler, MD  acetaminophen (TYLENOL) 500 MG tablet Take 500 mg by mouth 2 (two) times daily as needed for mild pain or headache.    [provider]  amLODipine (NORVASC) 10 MG tablet Take 1 tablet (10 mg total) by mouth daily. Patient not taking: Reported on 12/30/2016 10/30/16   Curt Bears, MD    Chlorphen-Phenyleph-ASA (ALKA-SELTZER PLUS COLD PO) Take 2 tablets by mouth at bedtime as needed (COUGH).    [provider]  dexamethasone (DECADRON) 4 MG tablet 4 mg by mouth twice [name] day the day before, day of and day after the chemotherapy every 3 weeks Patient not taking: Reported on 12/30/2016 09/25/16   Curt Bears, MD  lidocaine-prilocaine (EMLA) cream Apply 1 application topically as needed. Patient taking differently: Apply 1 application topically as needed (port).  05/08/16   Curt Bears, MD  omeprazole (PRILOSEC) 40 MG capsule  12/16/16   [provider]  ondansetron (ZOFRAN ODT) 4 MG disintegrating tablet Take 1 tablet (4 mg total) by mouth every 8 (eight) hours as needed for nausea or vomiting. 12/18/16   Eugenie Filler, MD  ondansetron [facility]) 4 MG tablet  12/16/16   [provider]    Physical Exam: Vitals:   12/30/16 1443 12/30/16 1452 12/30/16 1809 12/30/16 1922  BP: (!) 141/85  132/80 (!) 145/81  Pulse: (!) 109  79 (!) 101  Resp: 16  16 20   Temp: 98.2 F (36.8 C)     TempSrc: Oral     SpO2: 94%  97% 95%  Weight:  53.1 kg (117 lb)    Height:  5\' 11"  (1.803 m)      Constitutional: NAD, calm, comfortable cachetic Vitals:   12/30/16 1443 12/30/16 1452 12/30/16 1809 12/30/16 1922  BP: (!) 141/85  132/80 (!) 145/81  Pulse: (!) 109  79 (!) 101  Resp: 16  16 20   Temp: 98.2 F (36.8 C)     TempSrc: Oral     SpO2: 94%  97% 95%  Weight:  53.1 kg (117 lb)    Height:  5\' 11"  (1.803 m)     Eyes: PERRL, lids and conjunctivae normal ENMT: Mucous membranes are moist. Posterior pharynx clear of any exudate or lesions.Normal dentition.  Neck: normal, supple, no masses, no thyromegaly Respiratory: clear to auscultation bilaterally, no wheezing, no crackles. Normal respiratory effort. No accessory muscle use.  Cardiovascular: Regular rate and rhythm, no murmurs / rubs / gallops. No extremity edema. 2+ pedal pulses. No carotid bruits.   Abdomen: no tenderness, no masses palpated. No hepatosplenomegaly. Bowel sounds positive.  Musculoskeletal: no clubbing / cyanosis. No joint deformity upper and lower extremities. Good ROM, no contractures. Normal muscle tone.  Skin: no rashes, lesions, ulcers. No induration Neurologic: CN 2-12 grossly intact. Sensation intact. Symmetric strength, though effort seemed decreased to LE's? Psychiatric: Normal judgment and insight. Alert and oriented x 3. Normal mood.   Labs on Admission: I have personally reviewed following labs and imaging studies  CBC: Recent Labs  Lab 12/24/16 0418 12/27/16 0858 12/30/16 1603  WBC 4.3 5.6 7.5  NEUTROABS  --  3.0  --   HGB 8.6* 8.5* 7.5*  HCT 25.4* 25.5* 21.5*  MCV 103.7* 103.7* 104.9*  PLT 41* 84* 234  Basic Metabolic Panel: Recent Labs  Lab 12/24/16 0418 12/27/16 0858 12/30/16 1603  NA 136 137 136  K 3.9 3.5 3.2*  CL 95*  --  103  CO2 30 27 27   GLUCOSE 96 89 89  BUN 20 17.4 7  CREATININE 1.52* 1.5* 1.04  CALCIUM 9.2 9.5 8.6*  MG  --   --  1.7   GFR: Estimated Creatinine Clearance: 58.9 mL/min (by C-G formula based on SCr of 1.04 mg/dL). Liver Function Tests: Recent Labs  Lab 12/27/16 0858 12/30/16 1603  AST 180* 50*  ALT 177* 63  ALKPHOS 185* 147*  BILITOT 0.44 0.4  PROT 8.1 7.0  ALBUMIN 3.2* 2.9*   Recent Labs  Lab 12/30/16 1603  LIPASE 20   No results for input(s): AMMONIA in the last 168 hours. Coagulation Profile: No results for input(s): INR, PROTIME in the last 168 hours. Cardiac Enzymes: No results for input(s): CKTOTAL, CKMB, CKMBINDEX, TROPONINI in the last 168 hours. BNP (last 3 results) No results for input(s): PROBNP in the last 8760 hours. HbA1C: No results for input(s): HGBA1C in the last 72 hours. CBG: No results for input(s): GLUCAP in the last 168 hours. Lipid Profile: No results for input(s): CHOL, HDL, LDLCALC, TRIG, CHOLHDL, LDLDIRECT in the last 72 hours. Thyroid Function Tests: No results  for input(s): TSH, T4TOTAL, FREET4, T3FREE, THYROIDAB in the last 72 hours. Anemia Panel: No results for input(s): VITAMINB12, FOLATE, FERRITIN, TIBC, IRON, RETICCTPCT in the last 72 hours. Urine analysis:    Component Value Date/Time   COLORURINE YELLOW 12/16/2016 [place] 12/16/2016 1444   LABSPEC 1.016 12/16/2016 1444   PHURINE 7.0 12/16/2016 1444   GLUCOSEU NEGATIVE 12/16/2016 1444   HGBUR NEGATIVE 12/16/2016 1444   BILIRUBINUR NEGATIVE 12/16/2016 1444   KETONESUR 5 ([place]ina [place]) 12/16/2016 1444   PROTEINUR NEGATIVE 12/16/2016 1444   UROBILINOGEN 0.2 05/29/2011 0122   NITRITE NEGATIVE 12/16/2016 1444   LEUKOCYTESUR NEGATIVE 12/16/2016 1444    Radiological Exams on Admission: Dg Chest 2 View  Result Date: 12/30/2016 CLINICAL DATA:  Chest pain and shortness of breath. EXAM: CHEST  2 VIEW COMPARISON:  Chest radiograph 12/18/2016 FINDINGS: Monitoring leads overlie the patient. Right anterior chest wall [place] Danser-Cath is present tip projecting over the superior vena cava. Stable cardiac and mediastinal contours. Stable irregular consolidative opacity left upper hemithorax. Heterogeneous opacities left lung base. Small bilateral pleural effusions. No pneumothorax. Thoracic spine degenerative changes. IMPRESSION: Small bilateral pleural effusions with heterogeneous opacities left lung base which may represent atelectasis or infection. Stable irregular nodular density within the left upper hemithorax. Electronically Signed   By: Lovey Newcomer M.D.   On: 12/30/2016 15:20    EKG: Independently reviewed. Sinus tachycardia.  Old q in I and aVL.    Assessment/Plan Active Problems:   DOE (dyspnea on exertion)  Dyspnea on exertion  Symptomatic Anemia?:  Seems to be patient's most pressing complaint.  Notes since Friday he's had worsening DOE.  Notes similar to episode of SOB in hospital.  He's mildly tachycardic here, but satting normally on room air.  EKG notable for sinus tach and appears  similar to priors.  His hb is lower than his discharge Hb, but no reports of hematemesis or bloody stool and negative hemoccult.  Possible this represents symptomatic anemia.  No orthopnea.  Negative trop and BNP.  CXR with small bilateral pleural effusions and heterogenous opacities in L lung base reflecting atelectasis vs infection (no WBC count to suggest infection).  For now,  will trial transfusion and see if sx improve.  Low suspicion for PE, he had negative CTA when he had similar sx on 11/29, but if he does not improve with transfusion, will consider repeating this study.    Transfuse 1 unit pRBC Iron panel, B12, folate Continue to monitor sx, consider repeat CTA if not improving  Chest discomfort:  Seems mostly related to cough, but will trend troponins given vague symptoms.  Trend troponins, ekg in AM  Abdominal Discomfort:  Seems chronic (per last discharge summary, he has had many visits to ED with pain over last few months.  Normal LFTs (AST mildly elevated) and normal lipase.  Exam with mild tenderness.  Will check urinalysis.  Had EGD last hospitalization with gastritis and erosive gastropathy (biopsied and notable for chronic gastritis and metaplasia).  Discharged with pantoprazole 40 BID and zofran. Continue to monitor.   Metastatic Adenocarcinoma of lung Follows with Dr. Earlie [name] on chemo Continue MSIR for pain (of note he ran out of morphine on Thursday, the day before sx started, this maybe playing Moriah Shawley role in his symptoms?)  HTN:  Amlodipine  Leg Pain: describes bilateral paresthesias.  Possible neuropathy.  F/u B12, TSH, A1c. PT/OT.  Start gabapentin.   Homeless: social work  Possible COPD: started on nebs at last hospitalization. continue home meds, no wheezing  DVT prophylaxis: lovenox  Code Status: DNR Family Communication: none   Disposition Plan: pending Consults called: none  Admission status: obs    Fayrene Helper MD [facility] Pager  [phone]  If 7PM-7AM, please contact night-coverage [email or web address] Password [facility]  12/30/2016, 8:33 PM

## 2016-12-30 NOTE — ED Triage Notes (Signed)
Patient c/o intermittent chest pain, SOB, and mid abdominal pain x 2 weeks. Patient states SOB and chest pain are worse especially when trying to walk.

## 2016-12-31 ENCOUNTER — Other Ambulatory Visit: Payer: Medicaid Other

## 2016-12-31 ENCOUNTER — Observation Stay (HOSPITAL_COMMUNITY): Payer: Medicaid Other

## 2016-12-31 ENCOUNTER — Encounter (HOSPITAL_COMMUNITY): Payer: Self-pay | Admitting: Radiology

## 2016-12-31 DIAGNOSIS — Z79899 Other long term (current) drug therapy: Secondary | ICD-10-CM | POA: Diagnosis not present

## 2016-12-31 DIAGNOSIS — Z66 Do not resuscitate: Secondary | ICD-10-CM | POA: Diagnosis present

## 2016-12-31 DIAGNOSIS — M79609 Pain in unspecified limb: Secondary | ICD-10-CM | POA: Diagnosis not present

## 2016-12-31 DIAGNOSIS — N179 Acute kidney failure, unspecified: Secondary | ICD-10-CM | POA: Diagnosis present

## 2016-12-31 DIAGNOSIS — Z720 Tobacco use: Secondary | ICD-10-CM | POA: Diagnosis not present

## 2016-12-31 DIAGNOSIS — K5900 Constipation, unspecified: Secondary | ICD-10-CM | POA: Diagnosis present

## 2016-12-31 DIAGNOSIS — K319 Disease of stomach and duodenum, unspecified: Secondary | ICD-10-CM | POA: Diagnosis present

## 2016-12-31 DIAGNOSIS — K2950 Unspecified chronic gastritis without bleeding: Secondary | ICD-10-CM | POA: Diagnosis present

## 2016-12-31 DIAGNOSIS — I110 Hypertensive heart disease with heart failure: Secondary | ICD-10-CM | POA: Diagnosis present

## 2016-12-31 DIAGNOSIS — E43 Unspecified severe protein-calorie malnutrition: Secondary | ICD-10-CM | POA: Diagnosis present

## 2016-12-31 DIAGNOSIS — R0609 Other forms of dyspnea: Secondary | ICD-10-CM | POA: Diagnosis not present

## 2016-12-31 DIAGNOSIS — D630 Anemia in neoplastic disease: Secondary | ICD-10-CM | POA: Diagnosis present

## 2016-12-31 DIAGNOSIS — J441 Chronic obstructive pulmonary disease with (acute) exacerbation: Secondary | ICD-10-CM | POA: Diagnosis present

## 2016-12-31 DIAGNOSIS — I10 Essential (primary) hypertension: Secondary | ICD-10-CM | POA: Diagnosis not present

## 2016-12-31 DIAGNOSIS — J90 Pleural effusion, not elsewhere classified: Secondary | ICD-10-CM | POA: Diagnosis not present

## 2016-12-31 DIAGNOSIS — Z5111 Encounter for antineoplastic chemotherapy: Secondary | ICD-10-CM | POA: Diagnosis not present

## 2016-12-31 DIAGNOSIS — C7951 Secondary malignant neoplasm of bone: Secondary | ICD-10-CM | POA: Diagnosis present

## 2016-12-31 DIAGNOSIS — I5033 Acute on chronic diastolic (congestive) heart failure: Secondary | ICD-10-CM | POA: Diagnosis present

## 2016-12-31 DIAGNOSIS — J9601 Acute respiratory failure with hypoxia: Secondary | ICD-10-CM | POA: Diagnosis present

## 2016-12-31 DIAGNOSIS — Z590 Homelessness: Secondary | ICD-10-CM | POA: Diagnosis not present

## 2016-12-31 DIAGNOSIS — Z923 Personal history of irradiation: Secondary | ICD-10-CM | POA: Diagnosis not present

## 2016-12-31 DIAGNOSIS — G40909 Epilepsy, unspecified, not intractable, without status epilepticus: Secondary | ICD-10-CM | POA: Diagnosis present

## 2016-12-31 DIAGNOSIS — Z9889 Other specified postprocedural states: Secondary | ICD-10-CM | POA: Diagnosis not present

## 2016-12-31 DIAGNOSIS — T502X5A Adverse effect of carbonic-anhydrase inhibitors, benzothiadiazides and other diuretics, initial encounter: Secondary | ICD-10-CM | POA: Diagnosis present

## 2016-12-31 DIAGNOSIS — D649 Anemia, unspecified: Secondary | ICD-10-CM | POA: Diagnosis not present

## 2016-12-31 DIAGNOSIS — K2970 Gastritis, unspecified, without bleeding: Secondary | ICD-10-CM | POA: Diagnosis not present

## 2016-12-31 DIAGNOSIS — J449 Chronic obstructive pulmonary disease, unspecified: Secondary | ICD-10-CM | POA: Diagnosis not present

## 2016-12-31 DIAGNOSIS — R202 Paresthesia of skin: Secondary | ICD-10-CM | POA: Diagnosis present

## 2016-12-31 DIAGNOSIS — R0602 Shortness of breath: Secondary | ICD-10-CM | POA: Diagnosis present

## 2016-12-31 DIAGNOSIS — Z681 Body mass index (BMI) 19 or less, adult: Secondary | ICD-10-CM | POA: Diagnosis not present

## 2016-12-31 DIAGNOSIS — Z809 Family history of malignant neoplasm, unspecified: Secondary | ICD-10-CM | POA: Diagnosis not present

## 2016-12-31 DIAGNOSIS — Z7951 Long term (current) use of inhaled steroids: Secondary | ICD-10-CM | POA: Diagnosis not present

## 2016-12-31 DIAGNOSIS — C3492 Malignant neoplasm of unspecified part of left bronchus or lung: Secondary | ICD-10-CM | POA: Diagnosis present

## 2016-12-31 LAB — CBC
HEMATOCRIT: 22.8 % — AB (ref 39.0–52.0)
HEMOGLOBIN: 7.8 g/dL — AB (ref 13.0–17.0)
MCH: 35.3 pg — ABNORMAL HIGH (ref 26.0–34.0)
MCHC: 34.2 g/dL (ref 30.0–36.0)
MCV: 103.2 fL — ABNORMAL HIGH (ref 78.0–100.0)
Platelets: 233 10*3/uL (ref 150–400)
RBC: 2.21 MIL/uL — AB (ref 4.22–5.81)
RDW: 17.7 % — ABNORMAL HIGH (ref 11.5–15.5)
WBC: 6.3 10*3/uL (ref 4.0–10.5)

## 2016-12-31 LAB — COMPREHENSIVE METABOLIC PANEL
ALK PHOS: 119 U/L (ref 38–126)
ALT: 49 U/L (ref 17–63)
ANION GAP: 5 (ref 5–15)
AST: 41 U/L (ref 15–41)
Albumin: 2.5 g/dL — ABNORMAL LOW (ref 3.5–5.0)
BILIRUBIN TOTAL: 0.2 mg/dL — AB (ref 0.3–1.2)
BUN: 7 mg/dL (ref 6–20)
CALCIUM: 8.5 mg/dL — AB (ref 8.9–10.3)
CO2: 27 mmol/L (ref 22–32)
Chloride: 107 mmol/L (ref 101–111)
Creatinine, Ser: 1.15 mg/dL (ref 0.61–1.24)
GFR calc non Af Amer: >60 mL/min (ref >60)
Glucose, Bld: 93 mg/dL (ref 65–99)
POTASSIUM: 3.7 mmol/L (ref 3.5–5.1)
Sodium: 139 mmol/L (ref 135–145)
TOTAL PROTEIN: 6.2 g/dL — AB (ref 6.5–8.1)

## 2016-12-31 LAB — HEMOGLOBIN AND HEMATOCRIT, BLOOD
HEMATOCRIT: 25.3 % — AB (ref 39.0–52.0)
HEMOGLOBIN: 8.6 g/dL — AB (ref 13.0–17.0)

## 2016-12-31 LAB — TSH: TSH: 1.958 u[IU]/mL (ref 0.350–4.500)

## 2016-12-31 LAB — TROPONIN I: Troponin I: <0.03 ng/mL (ref <0.03)

## 2016-12-31 LAB — IRON AND TIBC
Iron: 37 ug/dL — ABNORMAL LOW (ref 45–182)
Saturation Ratios: 17 % — ABNORMAL LOW (ref 17.9–39.5)
TIBC: 223 ug/dL — ABNORMAL LOW (ref 250–450)
UIBC: 186 ug/dL

## 2016-12-31 LAB — FOLATE: Folate: 11.4 ng/mL (ref >5.9)

## 2016-12-31 LAB — GLUCOSE, CAPILLARY: Glucose-Capillary: 81 mg/dL (ref 65–99)

## 2016-12-31 LAB — HEMOGLOBIN A1C
HEMOGLOBIN A1C: 6.2 % — AB (ref 4.8–5.6)
MEAN PLASMA GLUCOSE: 131.24 mg/dL

## 2016-12-31 LAB — FERRITIN: Ferritin: 1573 ng/mL — ABNORMAL HIGH (ref 24–336)

## 2016-12-31 LAB — VITAMIN B12: Vitamin B-12: 1039 pg/mL — ABNORMAL HIGH (ref 180–914)

## 2016-12-31 MED ORDER — FUROSEMIDE 10 MG/ML IJ SOLN
20.0000 mg | Freq: Once | INTRAMUSCULAR | Status: AC
  Administered 2016-12-31: 20 mg via INTRAVENOUS
  Filled 2016-12-31: qty 2

## 2016-12-31 MED ORDER — CHLORHEXIDINE GLUCONATE 0.12 % MT SOLN
15.0000 mL | Freq: Two times a day (BID) | OROMUCOSAL | Status: DC
  Administered 2016-12-31 – 2017-01-07 (×10): 15 mL via OROMUCOSAL
  Filled 2016-12-31 (×12): qty 15

## 2016-12-31 MED ORDER — SODIUM CHLORIDE 0.9 % IV SOLN
INTRAVENOUS | Status: DC
  Administered 2016-12-31: 10:00:00 via INTRAVENOUS

## 2016-12-31 MED ORDER — SODIUM CHLORIDE 0.9% FLUSH
10.0000 mL | INTRAVENOUS | Status: DC | PRN
  Administered 2016-12-31: 10 mL
  Administered 2017-01-01: 20 mL
  Administered 2017-01-03: 10 mL
  Administered 2017-01-04: 20 mL
  Filled 2016-12-31 (×4): qty 40

## 2016-12-31 MED ORDER — IOPAMIDOL (ISOVUE-370) INJECTION 76%
100.0000 mL | Freq: Once | INTRAVENOUS | Status: AC | PRN
  Administered 2016-12-31: 74 mL via INTRAVENOUS

## 2016-12-31 MED ORDER — IPRATROPIUM-ALBUTEROL 0.5-2.5 (3) MG/3ML IN SOLN
3.0000 mL | Freq: Three times a day (TID) | RESPIRATORY_TRACT | Status: DC
  Administered 2017-01-01 – 2017-01-02 (×6): 3 mL via RESPIRATORY_TRACT
  Filled 2016-12-31 (×6): qty 3

## 2016-12-31 MED ORDER — IOPAMIDOL (ISOVUE-370) INJECTION 76%
INTRAVENOUS | Status: AC
  Filled 2016-12-31: qty 100

## 2016-12-31 MED ORDER — ORAL CARE MOUTH RINSE
15.0000 mL | Freq: Two times a day (BID) | OROMUCOSAL | Status: DC
  Administered 2017-01-02: 15 mL via OROMUCOSAL

## 2016-12-31 NOTE — Progress Notes (Signed)
Pt's o2 sat prior to walking was 97% on RA. He became short of breath while walking in the hallway. Pt walked about 22ft then started complaining of SOB. o2 sat down to 87% on room air. Pt placed on 2L nasal cannula. Pt reported improvement in SOB, o2 sat up to 94% on 2L nasal cannula

## 2016-12-31 NOTE — Progress Notes (Signed)
Pt refusing bed alarm and refuses to call when getting out of bed. Pt was educated on the risk of falling. Pt still refused saying "I'll be fine" and shrugging staff off. Attempts to monitor pt and continue education attempt still being made.

## 2016-12-31 NOTE — Progress Notes (Signed)
PROGRESS NOTE    [name]  VPX:[id number] DOB: [birth year] DOA: 12/30/2016 PCP: Patient, No Pcp Per   Brief Narrative:  [name] is a 56 y.o. male with medical history significant of stage IV adenocarcinoma of the lung, HTN, seizures, erosive gastropathy and gastritis who presented with several complaints, most pressing was shorntess of breath with exertion.  He notes he was feeling bad and has been feeling bad since discharge.  He notes on Friday, started having worsening DOE.  Couldn't walk across room without being SOB.  He notes this is similar to when he was in the hospital, but it seems to have gotten worse recently.  No orthopnea.  No PND.  No swelling.  No fevers.  He denies hematemesis or blood in stool.  He notes CP with cough.  Also CP last night.  He notes abdominal pain for months, chronic.  He also notes pain in his legs, below the knees bilaterally.  Paresthesias as well.  He ran out of MSIR last week on Thursday.  Was supposed to f/u with Dr. Earlie Server.  Assessment & Plan:   Principal Problem:   DOE (dyspnea on exertion) Active Problems:   Tobacco abuse   Adenocarcinoma of left lung, stage 4 (HCC)   Abdominal pain   HTN (hypertension)   Gastritis   Dyspnea on exertion  Symptomatic Anemia?:  Seems to be patient's most pressing complaint.  Notes since Friday he's had worsening DOE.  Notes similar to episode of SOB in hospital.  He's mildly tachycardic here, but satting normally on room air.  EKG notable for sinus tach and appears similar to priors.  His hb is lower than his discharge Hb, but no reports of hematemesis or bloody stool and negative hemoccult.  Possible this represents symptomatic anemia.  No orthopnea.  Negative trop and BNP.  CXR with small bilateral pleural effusions and heterogenous opacities in L lung base reflecting atelectasis vs infection (no WBC count to suggest infection).  For now, will trial transfusion and see if sx improve.  Low suspicion  for PE, he had negative CTA when he had similar sx on 11/29, but if he does not improve with transfusion, will consider repeating this study.    Will get CTA Repeat H/H given inappropriate rise Orthostatics, 6 minute walk Normal TSH S/p 1 unit pRBC Iron panel with elevated ferritin, but low iron and percent sat.  Normal folate, elevated B12.  Possible COPD: started on nebs at last hospitalization. continue home meds, no wheezing  Chest discomfort:  Seems mostly related to cough, but will trend troponins given vague symptoms.  Troponins negative.  Ekg appears stable. Echo at previous hospitalization with normal EF and systolic function.  Grade 1 diastolic dysfunction.  Mild MVP, trivial regurg.  Moderate TV regurg.  Increased PASP.    Abdominal Discomfort:  chronic (per last discharge summary, he has had many visits to ED with pain over last few months.  Normal LFTs (AST mildly elevated) and normal lipase.  Exam with mild tenderness.  Will check urinalysis.  Had EGD last hospitalization with gastritis and erosive gastropathy (biopsied and notable for chronic gastritis and metaplasia).  Discharged with pantoprazole 40 BID and zofran. Continue to monitor.   Metastatic Adenocarcinoma of lung Follows with Dr. Earlie Server on chemo Dr. Earlie Server aware of admission. Continue MSIR for pain (of note he ran out of morphine on Thursday, the day before sx started, this maybe playing a role in his symptoms?)  HTN:  Amlodipine  Leg Pain: describes bilateral paresthesias.  Possible neuropathy.  F/u B12, TSH, A1c. PT/OT.  Start gabapentin.   Homeless: social work  DVT prophylaxis: lovenox Code Status: DNR Family Communication: none Disposition Plan: pending improvement   Consultants:   none  Procedures: (Don't include imaging studies which can be auto populated. Include things that cannot be auto populated i.e. Echo, Carotid and venous dopplers, Foley, Bipap, HD, tubes/drains, wound vac,  central lines etc)  none  Antimicrobials: (specify start and planned stop date. Auto populated tables are space occupying and do not give end dates)  none    Subjective: Doesn't feel any better. Had significant SOB when walking to bathroom last night. Worried about cancer.   Objective: Vitals:   12/31/16 0119 12/31/16 0145 12/31/16 0355 12/31/16 0751  BP: 123/66 125/67 128/76   Pulse: 89 87 87   Resp: 19 18 18    Temp: 98.8 F (37.1 C) 98.1 F (36.7 C) 98.3 F (36.8 C)   TempSrc: Oral Oral Oral   SpO2: 100% 95% 92% 90%  Weight:      Height:        Intake/Output Summary (Last 24 hours) at 12/31/2016 0901 Last data filed at 12/31/2016 0641 Gross per 24 hour  Intake 296 ml  Output -  Net 296 ml   Filed Weights   12/30/16 1452  Weight: 53.1 kg (117 lb)    Examination:  General exam: Appears calm and comfortable. Cachetic. Respiratory system: Clear to auscultation. Respiratory effort normal. Cardiovascular system: S1 & S2 heard, RRR. No JVD, murmurs, rubs, gallops or clicks. No pedal edema. Gastrointestinal system: Abdomen is nondistended, soft and nontender. No organomegaly or masses felt. Normal bowel sounds heard. Central nervous system: Alert and oriented. No focal neurological deficits. Extremities: No LEE Skin: No rashes, lesions or ulcers Psychiatry: Judgement and insight appear normal. Mood & affect appropriate.     Data Reviewed: I have personally reviewed following labs and imaging studies  CBC: Recent Labs  Lab 12/27/16 0858 12/30/16 1603 12/31/16 0634  WBC 5.6 7.5 6.3  NEUTROABS 3.0  --   --   HGB 8.5* 7.5* 7.8*  HCT 25.5* 21.5* 22.8*  MCV 103.7* 104.9* 103.2*  PLT 84* 234 161   Basic Metabolic Panel: Recent Labs  Lab 12/27/16 0858 12/30/16 1603 12/31/16 0634  NA 137 136 139  K 3.5 3.2* 3.7  CL  --  103 107  CO2 27 27 27   GLUCOSE 89 89 93  BUN 17.4 7 7   CREATININE 1.5* 1.04 1.15  CALCIUM 9.5 8.6* 8.5*  MG  --  1.7  --     GFR: Estimated Creatinine Clearance: 53.2 mL/min (by C-G formula based on SCr of 1.15 mg/dL). Liver Function Tests: Recent Labs  Lab 12/27/16 0858 12/30/16 1603 12/31/16 0634  AST 180* 50* 41  ALT 177* 63 49  ALKPHOS 185* 147* 119  BILITOT 0.44 0.4 0.2*  PROT 8.1 7.0 6.2*  ALBUMIN 3.2* 2.9* 2.5*   Recent Labs  Lab 12/30/16 1603  LIPASE 20   No results for input(s): AMMONIA in the last 168 hours. Coagulation Profile: No results for input(s): INR, PROTIME in the last 168 hours. Cardiac Enzymes: Recent Labs  Lab 12/31/16 0055 12/31/16 0634  TROPONINI <0.03 <0.03   BNP (last 3 results) No results for input(s): PROBNP in the last 8760 hours. HbA1C: No results for input(s): HGBA1C in the last 72 hours. CBG: Recent Labs  Lab 12/31/16 0742  GLUCAP 81   Lipid Profile: No  results for input(s): CHOL, HDL, LDLCALC, TRIG, CHOLHDL, LDLDIRECT in the last 72 hours. Thyroid Function Tests: Recent Labs    12/31/16 0055  TSH 1.958   Anemia Panel: Recent Labs    12/30/16 2000  VITAMINB12 1,039*  FOLATE 11.4  FERRITIN 1,573*  TIBC 223*  IRON 37*   Sepsis Labs: No results for input(s): PROCALCITON, LATICACIDVEN in the last 168 hours.  Recent Results (from the past 240 hour(s))  TECHNOLOGIST REVIEW     Status: None   Collection Time: 12/27/16  8:58 AM  Result Value Ref Range Status   Technologist Review 1% myelo seen  Final         Radiology Studies: Dg Chest 2 View  Result Date: 12/30/2016 CLINICAL DATA:  Chest pain and shortness of breath. EXAM: CHEST  2 VIEW COMPARISON:  Chest radiograph 12/18/2016 FINDINGS: Monitoring leads overlie the patient. Right anterior chest wall Port-A-Cath is present tip projecting over the superior vena cava. Stable cardiac and mediastinal contours. Stable irregular consolidative opacity left upper hemithorax. Heterogeneous opacities left lung base. Small bilateral pleural effusions. No pneumothorax. Thoracic spine degenerative  changes. IMPRESSION: Small bilateral pleural effusions with heterogeneous opacities left lung base which may represent atelectasis or infection. Stable irregular nodular density within the left upper hemithorax. Electronically Signed   By: Lovey Newcomer M.D.   On: 12/30/2016 15:20        Scheduled Meds: . amLODipine  10 mg Oral Daily  . chlorhexidine  15 mL Mouth Rinse BID  . enoxaparin (LOVENOX) injection  40 mg Subcutaneous QHS  . gabapentin  300 mg Oral QHS  . ipratropium-albuterol  3 mL Inhalation Q6H  . loratadine  10 mg Oral Daily  . mouth rinse  15 mL Mouth Rinse q12n4p  . mometasone-formoterol  2 puff Inhalation BID  . pantoprazole  40 mg Oral BID   Continuous Infusions: . sodium chloride       LOS: 0 days    Time spent: over 30 min    Fayrene Helper, MD [facility] Pager [phone]  If 7PM-7AM, please contact night-coverage [email or web address] Password [email or web address] 12/31/2016, 9:01 AM

## 2017-01-01 ENCOUNTER — Ambulatory Visit: Payer: Self-pay | Admitting: Internal Medicine

## 2017-01-01 ENCOUNTER — Ambulatory Visit: Payer: Self-pay

## 2017-01-01 ENCOUNTER — Other Ambulatory Visit: Payer: Self-pay

## 2017-01-01 ENCOUNTER — Inpatient Hospital Stay (HOSPITAL_COMMUNITY): Payer: Medicaid Other

## 2017-01-01 DIAGNOSIS — M79609 Pain in unspecified limb: Secondary | ICD-10-CM

## 2017-01-01 DIAGNOSIS — R0602 Shortness of breath: Secondary | ICD-10-CM

## 2017-01-01 LAB — BPAM RBC
BLOOD PRODUCT EXPIRATION DATE: 201901072359
ISSUE DATE / TIME: 201812110120
Unit Type and Rh: 5100

## 2017-01-01 LAB — COMPREHENSIVE METABOLIC PANEL
ALBUMIN: 2.7 g/dL — AB (ref 3.5–5.0)
ALT: 43 U/L (ref 17–63)
ANION GAP: 8 (ref 5–15)
AST: 39 U/L (ref 15–41)
Alkaline Phosphatase: 134 U/L — ABNORMAL HIGH (ref 38–126)
BUN: 7 mg/dL (ref 6–20)
CO2: 28 mmol/L (ref 22–32)
Calcium: 8.7 mg/dL — ABNORMAL LOW (ref 8.9–10.3)
Chloride: 102 mmol/L (ref 101–111)
Creatinine, Ser: 1.27 mg/dL — ABNORMAL HIGH (ref 0.61–1.24)
GFR calc Af Amer: >60 mL/min (ref >60)
GFR calc non Af Amer: >60 mL/min (ref >60)
GLUCOSE: 99 mg/dL (ref 65–99)
POTASSIUM: 3.6 mmol/L (ref 3.5–5.1)
SODIUM: 138 mmol/L (ref 135–145)
TOTAL PROTEIN: 6.8 g/dL (ref 6.5–8.1)
Total Bilirubin: 0.5 mg/dL (ref 0.3–1.2)

## 2017-01-01 LAB — TYPE AND SCREEN
ABO/RH(D): O POS
Antibody Screen: NEGATIVE
UNIT DIVISION: 0

## 2017-01-01 LAB — CBC
HCT: 23.8 % — ABNORMAL LOW (ref 39.0–52.0)
Hemoglobin: 8.1 g/dL — ABNORMAL LOW (ref 13.0–17.0)
MCH: 35.2 pg — ABNORMAL HIGH (ref 26.0–34.0)
MCHC: 34 g/dL (ref 30.0–36.0)
MCV: 103.5 fL — ABNORMAL HIGH (ref 78.0–100.0)
PLATELETS: 257 10*3/uL (ref 150–400)
RBC: 2.3 MIL/uL — ABNORMAL LOW (ref 4.22–5.81)
RDW: 18.3 % — AB (ref 11.5–15.5)
WBC: 7 10*3/uL (ref 4.0–10.5)

## 2017-01-01 LAB — GLUCOSE, CAPILLARY: Glucose-Capillary: 106 mg/dL — ABNORMAL HIGH (ref 65–99)

## 2017-01-01 MED ORDER — GUAIFENESIN 100 MG/5ML PO SOLN
15.0000 mL | Freq: Once | ORAL | Status: AC
  Administered 2017-01-02: 300 mg via ORAL
  Filled 2017-01-01: qty 20
  Filled 2017-01-01: qty 10

## 2017-01-01 MED ORDER — IPRATROPIUM-ALBUTEROL 0.5-2.5 (3) MG/3ML IN SOLN
3.0000 mL | RESPIRATORY_TRACT | Status: DC | PRN
  Administered 2017-01-03 – 2017-01-07 (×3): 3 mL via RESPIRATORY_TRACT
  Filled 2017-01-01 (×3): qty 3

## 2017-01-01 NOTE — Progress Notes (Addendum)
Patient ID: [name], male   DOB: [birth year], 56 y.o.   MRN: [id number]  PROGRESS NOTE    [name]  ZYS:[id number] DOB: 26-Jun-1960 DOA: 12/30/2016  PCP: Patient, No Pcp Per   Brief Narrative:   56 y.o.malewith medical history significant of stage IV adenocarcinoma of the lung, HTN, seizures, erosive gastropathy and gastritis who presented with several complaints, most pressing was shorntess of breath with exertion. He was feeling bad since discharge. He started having worsening DOE. Couldn't walk across room without being SOB. He ran out of MSIR. Was supposed to f/u with Dr. Earlie Server.   Assessment & Plan:  Dyspnea on exertion  Symptomatic Anemia / Anemia of chronic disease - Seems to be patient's most pressing complaint - CXR on 12/10 showed small bilateral pleural effusions with heterogeneous opacities left lung base which may represent atelectasis or infection. Stable irregular nodular density within the left upper hemithorax - CT angio chest on 12/11 showed no evidence for acute pulmonary embolus. Stable post treatment changes in the left hemithorax with residual confluent soft tissue in the left upper lobe. Small pleural effusions and interlobular septal thickening suggestive of CHF. 4. T12 sclerotic bone metastasis similar to previous exam - S/P 1 U PRBC - Hgb is 8.1 this am  Acute COPD exacerbation  - Sounds congested this am - Added duoenb every 2 hours as needed for shortness of breath or wheezing - Continue sch nebulizer treatment   Chest discomfort - Troponin level negative - EKG without ischemic changes   Abdominal Discomfort - Continue Protonix   Metastatic Adenocarcinoma of lung - Outpt follow up   Essential hypertension - Continue Norvasc   Homeless - Appreciate social work assistance with discharge planning    DVT prophylaxis: Use SCD's instead due to risk of bleeding and lower hgb Code Status: DNR/DNI  Family Communication: no family  at the bedside Disposition Plan: Not yet stable for discharge    Consultants:   None  Procedures:   Bilateral LE doppler negative for DVT 01/01/2017  Antimicrobials:   None    Subjective: No overnight events.  Objective: Vitals:   12/31/16 2019 12/31/16 2049 01/01/17 0631 01/01/17 0740  BP:  (!) 108/57 106/66   Pulse:  94 87   Resp:   20   Temp:  98.8 F (37.1 C) 98.8 F (37.1 C)   TempSrc:  Oral Oral   SpO2: 94% 99% 100% 98%  Weight:      Height:        Intake/Output Summary (Last 24 hours) at 01/01/2017 1151 Last data filed at 01/01/2017 0916 Gross per 24 hour  Intake 2520 ml  Output 1950 ml  Net 570 ml   Filed Weights   12/30/16 1452  Weight: 53.1 kg (117 lb)    Examination:  General exam: Appears calm and comfortable  Respiratory system: rhonchorous, congested  Cardiovascular system: S1 & S2 heard, Rate controlled  Gastrointestinal system: Abdomen is nondistended, soft and nontender. No organomegaly or masses felt. Normal bowel sounds heard. Central nervous system: Alert and oriented. No focal neurological deficits. Extremities: Symmetric 5 x 5 power. Skin: No rashes, lesions or ulcers Psychiatry: Judgement and insight appear normal. Mood & affect appropriate.   Data Reviewed: I have personally reviewed following labs and imaging studies  CBC: Recent Labs  Lab 12/27/16 0858 12/30/16 1603 12/31/16 0634 12/31/16 1255 01/01/17 0400  WBC 5.6 7.5 6.3  --  7.0  NEUTROABS 3.0  --   --   --   --  HGB 8.5* 7.5* 7.8* 8.6* 8.1*  HCT 25.5* 21.5* 22.8* 25.3* 23.8*  MCV 103.7* 104.9* 103.2*  --  103.5*  PLT 84* 234 233  --  017   Basic Metabolic Panel: Recent Labs  Lab 12/27/16 0858 12/30/16 1603 12/31/16 0634 01/01/17 0400  NA 137 136 139 138  K 3.5 3.2* 3.7 3.6  CL  --  103 107 102  CO2 27 27 27 28   GLUCOSE 89 89 93 99  BUN 17.4 7 7 7   CREATININE 1.5* 1.04 1.15 1.27*  CALCIUM 9.5 8.6* 8.5* 8.7*  MG  --  1.7  --   --     GFR: Estimated Creatinine Clearance: 48.2 mL/min (A) (by C-G formula based on SCr of 1.27 mg/dL (H)). Liver Function Tests: Recent Labs  Lab 12/27/16 0858 12/30/16 1603 12/31/16 0634 01/01/17 0400  AST 180* 50* 41 39  ALT 177* 63 49 43  ALKPHOS 185* 147* 119 134*  BILITOT 0.44 0.4 0.2* 0.5  PROT 8.1 7.0 6.2* 6.8  ALBUMIN 3.2* 2.9* 2.5* 2.7*   Recent Labs  Lab 12/30/16 1603  LIPASE 20   No results for input(s): AMMONIA in the last 168 hours. Coagulation Profile: No results for input(s): INR, PROTIME in the last 168 hours. Cardiac Enzymes: Recent Labs  Lab 12/31/16 0055 12/31/16 0634 12/31/16 1255  TROPONINI <0.03 <0.03 <0.03   BNP (last 3 results) No results for input(s): PROBNP in the last 8760 hours. HbA1C: Recent Labs    12/31/16 0055  HGBA1C 6.2*   CBG: Recent Labs  Lab 12/31/16 0742 01/01/17 0751  GLUCAP 81 106*   Lipid Profile: No results for input(s): CHOL, HDL, LDLCALC, TRIG, CHOLHDL, LDLDIRECT in the last 72 hours. Thyroid Function Tests: Recent Labs    12/31/16 0055  TSH 1.958   Anemia Panel: Recent Labs    12/30/16 2000  VITAMINB12 1,039*  FOLATE 11.4  FERRITIN 1,573*  TIBC 223*  IRON 37*   Urine analysis:    Component Value Date/Time   COLORURINE YELLOW 12/30/2016 2049   APPEARANCEUR CLEAR 12/30/2016 2049   LABSPEC 1.005 12/30/2016 2049   PHURINE 7.0 12/30/2016 2049   GLUCOSEU NEGATIVE 12/30/2016 2049   HGBUR NEGATIVE 12/30/2016 2049   BILIRUBINUR NEGATIVE 12/30/2016 2049   KETONESUR NEGATIVE 12/30/2016 2049   PROTEINUR NEGATIVE 12/30/2016 2049   UROBILINOGEN 0.2 05/29/2011 0122   NITRITE NEGATIVE 12/30/2016 2049   LEUKOCYTESUR NEGATIVE 12/30/2016 2049   Sepsis Labs: @LABRCNTIP (procalcitonin:4,lacticidven:4)   ) Recent Results (from the past 240 hour(s))  TECHNOLOGIST REVIEW     Status: None   Collection Time: 12/27/16  8:58 AM  Result Value Ref Range Status   Technologist Review 1% myelo seen  Final       Radiology Studies: Dg Chest 2 View  Result Date: 12/30/2016 CLINICAL DATA:  Chest pain and shortness of breath. EXAM: CHEST  2 VIEW COMPARISON:  Chest radiograph 12/18/2016 FINDINGS: Monitoring leads overlie the patient. Right anterior chest wall Port-A-Cath is present tip projecting over the superior vena cava. Stable cardiac and mediastinal contours. Stable irregular consolidative opacity left upper hemithorax. Heterogeneous opacities left lung base. Small bilateral pleural effusions. No pneumothorax. Thoracic spine degenerative changes. IMPRESSION: Small bilateral pleural effusions with heterogeneous opacities left lung base which may represent atelectasis or infection. Stable irregular nodular density within the left upper hemithorax. Electronically Signed   By: Lovey Newcomer M.D.   On: 12/30/2016 15:20   Ct Angio Chest Pe W Or Wo Contrast  Result Date: 12/31/2016 CLINICAL  DATA:  Evaluate for pulmonary embolus. EXAM: CT ANGIOGRAPHY CHEST WITH CONTRAST TECHNIQUE: Multidetector CT imaging of the chest was performed using the standard protocol during bolus administration of intravenous contrast. Multiplanar CT image reconstructions and MIPs were obtained to evaluate the vascular anatomy. CONTRAST:  101mL ISOVUE-370 IOPAMIDOL (ISOVUE-370) INJECTION 76% COMPARISON:  12/19/2016 FINDINGS: Cardiovascular: Normal heart size. No pericardial effusion. Main pulmonary artery appears normal. No saddle embolus. No lobar or segmental pulmonary artery filling defects. Mediastinum/Nodes: Mild circumferential wall thickening involving the mid and distal esophagus. Soft tissue haziness throughout the mediastinal fat is noted. No measurable adenopathy identified. No axillary or supraclavicular adenopathy. Lungs/Pleura: Moderate right pleural effusion and small left pleural effusion are similar to previous exam. Moderate to advanced changes of emphysema. Diffuse smooth interlobular septal thickening is identified in both  lungs suggestive of pulmonary edema. Nodular septal thickening in the lingula and left lower lobe is again identified compatible with lymphangitic spread of tumor. Confluent irregular soft tissue within the left upper lobe measures 2.8 cm, image 32 of series 10. Unchanged from previous exam. Subsegmental atelectasis in the right base. Upper Abdomen: No acute abnormality. Musculoskeletal: Stable diffuse scleroses involving the T12 vertebra compatible with metastatic disease. Review of the MIP images confirms the above findings. IMPRESSION: 1. No evidence for acute pulmonary embolus. 2. Stable post treatment changes in the left hemithorax with residual confluent soft tissue in the left upper lobe. 3. Small pleural effusions and interlobular septal thickening suggestive of CHF. 4. T12 sclerotic bone metastasis.  Similar to previous exam. 5.  Aortic Atherosclerosis (ICD10-I70.0). Electronically Signed   By: Kerby Moors M.D.   On: 12/31/2016 10:31      Scheduled Meds: . amLODipine  10 mg Oral Daily  . enoxaparin   40 mg Subcutaneous QHS  . gabapentin  300 mg Oral QHS  . ipratropium-albutero  3 mL Inhalation TID  . loratadine  10 mg Oral Daily  . mometasone-formo  2 puff Inhalation BID  . pantoprazole  40 mg Oral BID   Continuous Infusions: . sodium chloride 100 mL/hr at 12/31/16 0932     LOS: 1 day    Time spent: 25 minutes  Greater than 50% of the time spent on counseling and coordinating the care.   Leisa Lenz, MD [facility] Pager [phone]  If 7PM-7AM, please contact night-coverage [email or web address] Password [email or web address] 01/01/2017, 11:51 AM

## 2017-01-01 NOTE — Progress Notes (Signed)
Bilateral lower extremity venous duplex completed. No evidence of DVT, superficial thrombosis, or Baker's cyst. Toma Copier, RVS 01/01/2017, 9:59 AM

## 2017-01-01 NOTE — Evaluation (Signed)
Physical Therapy Evaluation Patient Details Name: [name] MRN: [id number] DOB: 1960/09/13 Today's Date: 01/01/2017   History of Present Illness  56 yo male admitted with dyspnea on exertion. Hx of met lung cancer, Sz.   Clinical Impression  On eval, walked 30'x2 with use of the IV pole and 60'x2 with use of a rollator. Improved activity tolerance with rollator use and pt was able to use seat of walker to rest. Ambulated on 3L [place] O2-sat reading was 94%. Dyspnea 2-3/4 with activity. Cues for pursed lip/deep breathing during session. Discussed d/c plan. Pt has been staying in a shelter [facility]. Recommend SW consult for pt to discuss options. Will follow during hospital stay.     Follow Up Recommendations SNF(if pt is agreeable.)    Equipment Recommendations  (4 wheeled rolling walker)    Recommendations for Other Services       Precautions / Restrictions Precautions Precautions: Fall Precaution Comments: monitor O2 sats Restrictions Weight Bearing Restrictions: No      Mobility  Bed Mobility Overal bed mobility: Modified Independent                Transfers Overall transfer level: Needs assistance   Transfers: Sit to/from Stand Sit to Stand: Supervision         General transfer comment: for safety, lines.  Ambulation/Gait Ambulation/Gait assistance: Min guard Ambulation Distance (Feet): 60 Feet(60'x2, 30'x2) Assistive device: 4-wheeled walker(IV pole) Gait Pattern/deviations: Step-through pattern;Decreased stride length;Decreased step length - right;Decreased step length - left     General Gait Details: Close guard for safety. Pt walked 30'x 2 while holding on to the IV pole-very slow gait speed. Pt fatigued very easily required a seated rest break after 30 feet. Transitioned to rollator use, pt tolerated activity a little better. SEated rest break after 60 feet. Dyspnea 2-3/4. Ambulated on 3L [place] O2-sat 94%  Stairs            Wheelchair Mobility     Modified Rankin (Stroke Patients Only)       Balance                                             Pertinent Vitals/Pain Pain Assessment: No/denies pain    Home Living Family/patient expects to be discharged to:: Shelter/Homeless                      Prior Function Level of Independence: Independent               Hand Dominance        Extremity/Trunk Assessment   Upper Extremity Assessment Upper Extremity Assessment: Overall WFL for tasks assessed    Lower Extremity Assessment Lower Extremity Assessment: Generalized weakness;LLE deficits/detail;RLE deficits/detail RLE Sensation: history of peripheral neuropathy LLE Sensation: history of peripheral neuropathy    Cervical / Trunk Assessment Cervical / Trunk Assessment: Normal  Communication   Communication: No difficulties  Cognition Arousal/Alertness: Awake/alert Behavior During Therapy: WFL for tasks assessed/performed Overall Cognitive Status: Within Functional Limits for tasks assessed                                        General Comments      Exercises     Assessment/Plan    PT Assessment Patient  needs continued PT services  PT Problem List Decreased mobility;Decreased activity tolerance;Cardiopulmonary status limiting activity       PT Treatment Interventions Gait training;Functional mobility training;Therapeutic activities;Patient/family education;Therapeutic exercise;DME instruction    PT Goals (Current goals can be found in the Care Plan section)  Acute Rehab PT Goals Patient Stated Goal: to breathe better PT Goal Formulation: With patient Time For Goal Achievement: 01/15/17 Potential to Achieve Goals: Good    Frequency Min 3X/week   Barriers to discharge        Co-evaluation               AM-PAC PT "6 Clicks" Daily Activity  Outcome Measure Difficulty turning over in bed (including adjusting bedclothes, sheets and blankets)?:  None Difficulty moving from lying on back to sitting on the side of the bed? : None Difficulty sitting down on and standing up from a chair with arms (e.g., wheelchair, bedside commode, etc,.)?: None Help needed moving to and from a bed to chair (including a wheelchair)?: None Help needed walking in hospital room?: A Little Help needed climbing 3-5 steps with a railing? : A Little 6 Click Score: 22    End of Session Equipment Utilized During Treatment: Oxygen Activity Tolerance: Patient limited by fatigue(limited by dyspnea) Patient left: in bed;with call bell/phone within reach   PT Visit Diagnosis: Other abnormalities of gait and mobility (R26.89)    Time: 1030-1314 PT Time Calculation (min) (ACUTE ONLY): 38 min   Charges:   PT Evaluation $PT Eval Moderate Complexity: 1 Mod PT Treatments $Gait Training: 8-22 mins $Therapeutic Activity: 8-22 mins   PT G Codes:          Weston Anna, MPT Pager: [phone]

## 2017-01-02 ENCOUNTER — Telehealth: Payer: Self-pay | Admitting: Internal Medicine

## 2017-01-02 ENCOUNTER — Encounter (HOSPITAL_COMMUNITY): Payer: Self-pay | Admitting: Internal Medicine

## 2017-01-02 MED ORDER — POLYETHYLENE GLYCOL 3350 17 G PO PACK
17.0000 g | PACK | Freq: Every day | ORAL | Status: DC
  Administered 2017-01-02 – 2017-01-07 (×5): 17 g via ORAL
  Filled 2017-01-02 (×6): qty 1

## 2017-01-02 NOTE — Progress Notes (Addendum)
[name], [name]    Brief Narrative: [name] is a 56 year old male with past medical history of stage IV adenocarcinoma of the lung, hypertension, seizure disorder, gastritis who presented here with complaints of shortness of breath on exertion.  [name] reported difficulty in ambulation due to dyspnea.  Assessment & Plan:  Dyspnea on exertion: Persistent.  [name] is still feeling weak and short of breath.  [name] had similar complaint when he was admitted on last admission .He has been involved with physical therapy and recommended skilled nursing facility on discharge.  Social worker on board. We will continue supplemental oxygen as needed. Dyspnea on exertion most likely secondary to his COPD versus lung cancer.  Low suspicion for CHF.  His echocardiogram done in November this year showed normal left ventricular size and function, normal ejection fraction, grade 1 dysfunction.  [name] was on Lasix on last admission but not at home. He has mild crackles on his left lung but not on the right and he does not have peripheral edema.  Will hold Lasix for now CT angiogram of the chest done on 12/11 did not show any pulmonary embolus.  It showed a small bilateral pleural effusions, intralobular septal thickening.  Stable posttreatment changes in the left hemithorax with residual confluent soft tissue in the left upper lobe.  Chest x-ray on 12/10 showed bilateral pleural effusions with heterogeneous opacities in the left lung base.  Stable irregular nodular density within the left upper hemithorax.  COPD: On inhalers at home.  We will continue nebulizer treatment here.  Currently does not have wheezes on auscultation.  Continue supplemental oxygen as necessary.   Chest pain/abdominal discomfort: Troponins were negative.  EKG did not show any ischemic changes.  Continue Protonix for abdominal discomfort.  Most  likely secondary to gastritis.  Metastatic adenocarcinoma of the lung: Follows with Dr. Earlie Server.  Currently on chemotherapy.  Follows as an outpatient.  Hypertension: On normal works at home.  We will continue to monitor his blood pressure.  We will continue his home medication  Homelessness: Education officer, museum on board.  Physical therapy recommended skilled nursing facility on discharge.  Anemia: Currently H&H is stable.  [name] has history of GI bleed he was admitted with hematemesis on 12/16/16.  He underwent upper endoscopy on 12/17/16 which showed nonbleeding erosive gastropathy.  PPI has been recommended.  Follows with gastroenterology as an outpatient.  Anemia is also likely associated with his lung cancer.  Acute kidney injury: Creatinine slightly worsened today.  We will continue to monitor.  Constipation :Will order MiraLAX:    Principal Problem:   DOE (dyspnea on exertion) Active Problems:   Tobacco abuse   Adenocarcinoma of left lung, stage 4 (HCC)   Abdominal pain   COPD (chronic obstructive pulmonary disease) (HCC)   HTN (hypertension)   Gastritis     DVT prophylaxis: SCD Code Status:DNR/DNI Family Communication: Johnathan family present in the bedside  disposition Plan: Skilled nursing facility versus Shelter home.  Not ready for discharge yet  Consultants:   None   Procedures: Bilateral lower extremities Doppler negative for DVT done on 01/01/17  Antimicrobials: None  Subjective: [name] seen and examined at bedside this morning.  He still complains of shortness of breath.  Was coughing.  Objective: Vitals:   01/01/17 2106 01/01/17 2148 01/02/17 0533 01/02/17 0905  BP:  (!) 113/58 115/73   Pulse:  96 80   Resp:  18 16   Temp:  98.5 F (36.9 C) 98 F (36.7 C)   TempSrc:  Oral Oral   SpO2: 94% 100% 99% 97%  Weight:      Height:        Intake/Output Summary (Last 24 hours) at 01/02/2017 1112 Last data filed at 01/02/2017 0536 Gross [name] 24 hour  Intake  -  Output 2050 ml  Net -2050 ml   Filed Weights   12/30/16 1452  Weight: 53.1 kg (117 lb)    Examination:  General exam: Appears calm and comfortable  Respiratory system: Crackles on the left lung. Respiratory effort normal.  Johnathan wheezes Cardiovascular system: S1 & S2 heard, RRR. Johnathan JVD, murmurs, rubs, gallops or clicks. Johnathan pedal edema, Chemo-Port on the right side Gastrointestinal system: Abdomen is nondistended, soft and nontender. Johnathan organomegaly or masses felt. Normal bowel sounds heard. Central nervous system: Alert and oriented. Johnathan focal neurological deficits. Extremities: Symmetric 5 x 5 power. Skin: Johnathan rashes, lesions or ulcers Psychiatry: Judgement and insight appear normal. Mood & affect appropriate.     Data Reviewed: I have personally reviewed following labs and imaging studies  CBC: Recent Labs  Lab 12/27/16 0858 12/30/16 1603 12/31/16 0634 12/31/16 1255 01/01/17 0400  WBC 5.6 7.5 6.3  --  7.0  NEUTROABS 3.0  --   --   --   --   HGB 8.5* 7.5* 7.8* 8.6* 8.1*  HCT 25.5* 21.5* 22.8* 25.3* 23.8*  MCV 103.7* 104.9* 103.2*  --  103.5*  PLT 84* 234 233  --  161   Basic Metabolic Panel: Recent Labs  Lab 12/27/16 0858 12/30/16 1603 12/31/16 0634 01/01/17 0400  NA 137 136 139 138  K 3.5 3.2* 3.7 3.6  CL  --  103 107 102  CO2 27 27 27 28   GLUCOSE 89 89 93 99  BUN 17.4 7 7 7   CREATININE 1.5* 1.04 1.15 1.27*  CALCIUM 9.5 8.6* 8.5* 8.7*  MG  --  1.7  --   --    GFR: Estimated Creatinine Clearance: 48.2 mL/min (A) (by C-G formula based on SCr of 1.27 mg/dL (H)). Liver Function Tests: Recent Labs  Lab 12/27/16 0858 12/30/16 1603 12/31/16 0634 01/01/17 0400  AST 180* 50* 41 39  ALT 177* 63 49 43  ALKPHOS 185* 147* 119 134*  BILITOT 0.44 0.4 0.2* 0.5  PROT 8.1 7.0 6.2* 6.8  ALBUMIN 3.2* 2.9* 2.5* 2.7*   Recent Labs  Lab 12/30/16 1603  LIPASE 20   Johnathan results for input(s): AMMONIA in the last 168 hours. Coagulation Profile: Johnathan results for  input(s): INR, PROTIME in the last 168 hours. Cardiac Enzymes: Recent Labs  Lab 12/31/16 0055 12/31/16 0634 12/31/16 1255  TROPONINI <0.03 <0.03 <0.03   BNP (last 3 results) Johnathan results for input(s): PROBNP in the last 8760 hours. HbA1C: Recent Labs    12/31/16 0055  HGBA1C 6.2*   CBG: Recent Labs  Lab 12/31/16 0742 01/01/17 0751  GLUCAP 81 106*   Lipid Profile: Johnathan results for input(s): CHOL, HDL, LDLCALC, TRIG, CHOLHDL, LDLDIRECT in the last 72 hours. Thyroid Function Tests: Recent Labs    12/31/16 0055  TSH 1.958   Anemia Panel: Recent Labs    12/30/16 2000  VITAMINB12 1,039*  FOLATE 11.4  FERRITIN 1,573*  TIBC 223*  IRON 37*   Sepsis Labs: Johnathan results for input(s): PROCALCITON, LATICACIDVEN in the last 168 hours.  Recent Results (from the past 240 hour(s))  TECHNOLOGIST REVIEW  Status: None   Collection Time: 12/27/16  8:58 AM  Result Value Ref Range Status   Technologist Review 1% myelo seen  Final         Radiology Studies: Johnathan results found.      Scheduled Meds: . amLODipine  10 mg Oral Daily  . chlorhexidine  15 mL Mouth Rinse BID  . gabapentin  300 mg Oral QHS  . ipratropium-albuterol  3 mL Inhalation TID  . loratadine  10 mg Oral Daily  . mouth rinse  15 mL Mouth Rinse q12n4p  . pantoprazole  40 mg Oral BID   Continuous Infusions: . sodium chloride 10 mL/hr at 01/01/17 1457     LOS: 2 days    Time spent: 25 minutes Greater than 50% of time spent in counseling and coordinating the care    Ejay Lashley Jodie Echevaria, MD [facility] Pager [phone]  If 7PM-7AM, please contact night-coverage [email or web address] Password [email or web address] 01/02/2017, 11:12 AM

## 2017-01-02 NOTE — Telephone Encounter (Signed)
Left message re next appointment for 12/20. Schedule mailed.

## 2017-01-02 NOTE — Progress Notes (Signed)
Physical Therapy Treatment Patient Details Name: [name] MRN: [id number] DOB: 1960-10-11 Today's Date: 01/02/2017    History of Present Illness 56 yo male admitted with dyspnea on exertion. Hx of met lung cancer, Sz.     PT Comments    Progressing slowly with mobility. O2 sats dropped to 86% on 2L [place] O2, dyspnea 2/4 during ambulation. Pt appears to tolerate ambulation better with UE support from rollator. Rollator also allows pt to be able to sit and rest as needed. Will continue to follow.    Follow Up Recommendations  SNF vs return to shelter (depending on progress and pt decision)     Equipment Recommendations  4 wheeled rolling walker     Recommendations for Other Services       Precautions / Restrictions Precautions Precautions: None Precaution Comments: monitor O2 sats Restrictions Weight Bearing Restrictions: No    Mobility  Bed Mobility Overal bed mobility: Modified Independent                Transfers Overall transfer level: Needs assistance   Transfers: Sit to/from Stand Sit to Stand: Supervision         General transfer comment: for safety, lines.  Ambulation/Gait Ambulation/Gait assistance: Min guard Ambulation Distance (Feet): 75 Feet(x2) Assistive device: 4-wheeled walker Gait Pattern/deviations: Step-through pattern     General Gait Details: Close guard for safety. Seated rest break needed/taken after 75 feet. O2 sats dropped to 86% on 2L [place] O2 during ambulation. Dyspnea 2/4.    Stairs            Wheelchair Mobility    Modified Rankin (Stroke Patients Only)       Balance                                            Cognition Arousal/Alertness: Awake/alert Behavior During Therapy: WFL for tasks assessed/performed Overall Cognitive Status: Within Functional Limits for tasks assessed                                        Exercises      General Comments        Pertinent  Vitals/Pain Pain Assessment: No/denies pain    Home Living                      Prior Function            PT Goals (current goals can now be found in the care plan section) Progress towards PT goals: Progressing toward goals    Frequency    Min 3X/week      PT Plan Current plan remains appropriate    Co-evaluation              AM-PAC PT "6 Clicks" Daily Activity  Outcome Measure  Difficulty turning over in bed (including adjusting bedclothes, sheets and blankets)?: None Difficulty moving from lying on back to sitting on the side of the bed? : None Difficulty sitting down on and standing up from a chair with arms (e.g., wheelchair, bedside commode, etc,.)?: None Help needed moving to and from a bed to chair (including a wheelchair)?: None Help needed walking in hospital room?: A Little Help needed climbing 3-5 steps with a railing? : A Little 6 Click Score:  22    End of Session Equipment Utilized During Treatment: Oxygen Activity Tolerance: Patient limited by fatigue(Limited by dyspnea) Patient left: in bed;with call bell/phone within reach   PT Visit Diagnosis: Other abnormalities of gait and mobility (R26.89)     Time: 1552-0802 PT Time Calculation (min) (ACUTE ONLY): 18 min  Charges:  $Gait Training: 8-22 mins                    G Codes:          Weston Anna, MPT Pager: [phone]

## 2017-01-02 NOTE — Progress Notes (Signed)
Pt educated on fall risk/safety and importance of calling when getting out of bed. Pt refused education, stating "I don't why y'all keep acting like something is wrong with me, nothing is wrong with me." Pt refused bed alarm and further education. Will monitor.

## 2017-01-03 ENCOUNTER — Inpatient Hospital Stay (HOSPITAL_COMMUNITY): Payer: Medicaid Other

## 2017-01-03 ENCOUNTER — Encounter (HOSPITAL_COMMUNITY): Payer: Self-pay | Admitting: Internal Medicine

## 2017-01-03 DIAGNOSIS — E43 Unspecified severe protein-calorie malnutrition: Secondary | ICD-10-CM

## 2017-01-03 LAB — CBC WITH DIFFERENTIAL/PLATELET
Basophils Absolute: 0 10*3/uL (ref 0.0–0.1)
Basophils Relative: 0 %
EOS PCT: 2 %
Eosinophils Absolute: 0.2 10*3/uL (ref 0.0–0.7)
HEMATOCRIT: 25.1 % — AB (ref 39.0–52.0)
HEMOGLOBIN: 8.3 g/dL — AB (ref 13.0–17.0)
Lymphocytes Relative: 19 %
Lymphs Abs: 1.4 10*3/uL (ref 0.7–4.0)
MCH: 35 pg — ABNORMAL HIGH (ref 26.0–34.0)
MCHC: 33.1 g/dL (ref 30.0–36.0)
MCV: 105.9 fL — AB (ref 78.0–100.0)
MONOS PCT: 10 %
Monocytes Absolute: 0.8 10*3/uL (ref 0.1–1.0)
NEUTROS PCT: 69 %
Neutro Abs: 5.2 10*3/uL (ref 1.7–7.7)
PLATELETS: 238 10*3/uL (ref 150–400)
RBC: 2.37 MIL/uL — AB (ref 4.22–5.81)
RDW: 17.8 % — ABNORMAL HIGH (ref 11.5–15.5)
WBC: 7.6 10*3/uL (ref 4.0–10.5)

## 2017-01-03 LAB — BODY FLUID CELL COUNT WITH DIFFERENTIAL
Eos, Fluid: 1 %
Lymphs, Fluid: 77 %
Monocyte-Macrophage-Serous Fluid: 22 % — ABNORMAL LOW (ref 50–90)
Neutrophil Count, Fluid: 0 % (ref 0–25)
Total Nucleated Cell Count, Fluid: 4079 cu mm — ABNORMAL HIGH (ref 0–1000)

## 2017-01-03 LAB — BASIC METABOLIC PANEL
ANION GAP: 7 (ref 5–15)
BUN: 12 mg/dL (ref 6–20)
CO2: 29 mmol/L (ref 22–32)
Calcium: 9.3 mg/dL (ref 8.9–10.3)
Chloride: 102 mmol/L (ref 101–111)
Creatinine, Ser: 1.31 mg/dL — ABNORMAL HIGH (ref 0.61–1.24)
GFR calc Af Amer: >60 mL/min (ref >60)
GFR, EST NON AFRICAN AMERICAN: 59 mL/min — AB (ref >60)
GLUCOSE: 114 mg/dL — AB (ref 65–99)
POTASSIUM: 4.1 mmol/L (ref 3.5–5.1)
Sodium: 138 mmol/L (ref 135–145)

## 2017-01-03 LAB — PROTEIN, PLEURAL OR PERITONEAL FLUID: TOTAL PROTEIN, FLUID: 4.3 g/dL

## 2017-01-03 MED ORDER — ADULT MULTIVITAMIN W/MINERALS CH
1.0000 | ORAL_TABLET | Freq: Every day | ORAL | Status: DC
  Administered 2017-01-04 – 2017-01-07 (×4): 1 via ORAL
  Filled 2017-01-03 (×4): qty 1

## 2017-01-03 MED ORDER — MOMETASONE FURO-FORMOTEROL FUM 100-5 MCG/ACT IN AERO
2.0000 | INHALATION_SPRAY | Freq: Two times a day (BID) | RESPIRATORY_TRACT | Status: DC
  Administered 2017-01-03 – 2017-01-07 (×8): 2 via RESPIRATORY_TRACT
  Filled 2017-01-03: qty 8.8

## 2017-01-03 MED ORDER — LIDOCAINE HCL 1 % IJ SOLN
INTRAMUSCULAR | Status: AC
  Filled 2017-01-03: qty 20

## 2017-01-03 MED ORDER — FUROSEMIDE 10 MG/ML IJ SOLN
20.0000 mg | Freq: Once | INTRAMUSCULAR | Status: AC
  Administered 2017-01-03: 20 mg via INTRAVENOUS
  Filled 2017-01-03: qty 2

## 2017-01-03 MED ORDER — FUROSEMIDE 20 MG PO TABS
20.0000 mg | ORAL_TABLET | Freq: Every day | ORAL | Status: DC
  Administered 2017-01-04 – 2017-01-05 (×2): 20 mg via ORAL
  Filled 2017-01-03 (×2): qty 1

## 2017-01-03 MED ORDER — ENSURE ENLIVE PO LIQD: 237.0000 mL | Freq: Three times a day (TID) | ORAL | Status: DC

## 2017-01-03 MED ORDER — IPRATROPIUM-ALBUTEROL 0.5-2.5 (3) MG/3ML IN SOLN
3.0000 mL | Freq: Two times a day (BID) | RESPIRATORY_TRACT | Status: DC
  Administered 2017-01-03: 3 mL via RESPIRATORY_TRACT
  Filled 2017-01-03 (×2): qty 3

## 2017-01-03 NOTE — Progress Notes (Addendum)
PROGRESS NOTE    [name]  JHE:[id number] DOB: 11-Nov-1960 DOA: 12/30/2016 PCP: Patient, No Pcp Per    Brief Narrative: Patient is a 56 year old male with past medical history of stage IV adenocarcinoma of the lung, hypertension, seizure disorder, gastritis who presented here with complaints of shortness of breath on exertion.  Patient reported difficulty in ambulation due to dyspnea.    Assessment & Plan:   Principal Problem:   DOE (dyspnea on exertion) Active Problems:   Tobacco abuse   Adenocarcinoma of left lung, stage 4 (HCC)   Abdominal pain   COPD (chronic obstructive pulmonary disease) (HCC)   HTN (hypertension)   Gastritis   Severe malnutrition (HCC)   Dyspnea on exertion: Slightly improved today.  But he still complains of dyspnea and weakness.He desaturates on ambulation. He might need home oxygen on discharge.  We may need to qualify him for home oxygen.  I discussed this plan with RN today. We will continue supplemental oxygen as needed. Dyspnea on exertion most likely secondary to right-sided pleural effusion vs  spreading of his lung cancer vs COPD. CT angiogram of the chest done on 12/11 did not show any pulmonary embolus.  It showed moderate right-sided pleural effusion which is similar to previous exam.  Moderate to advanced changes of emphysema.  Diffuse smooth interlobular septal thickening suggestive of pulmonary edema.  No evidence of pleural thickening in the lingula and left lower lobe space compatible with lymphangitic spread of tumor.  Unchanged irregular soft tissue within the left upper lobe. Patient has mild crackles on his left lung. Patient was suspected to have volume overload/CHF on his last admission and was treated with Lasix but was not discharged with any diuretics.  His echocardiogram done last month showed normal left ventricle size and function, normal ejection fraction, grade 1 diastolic dysfunction. He presented with dyspnea on exertion  again which was his main problem on last admission also. Patient will be given 20 mg of IV Lasix today.  Will start on 20 mg Lasix orally from tomorrow. US thoracentesis has been ordered for moderate right-sided pleural effusion. Patient is afebrile and his white cell counts are stable .Low suspicion for pneumonia.  Homelessness:Patient was everted by physical therapy and recommended skilled nursing facility on discharge which he denied.  Social worker was following.  When patient is ready,he will be discharged to the shelter where he came from.  He needs walker on ambulation.  COPD: Currently stable.  He does not have wheezes on auscultation.  He is on inhalers at home which we will continue.  We will continue nebulizer treatment initially.  He is not on home oxygen.  Chest pain/abdominal discomfort: Much improved today.  Continue Protonix.  Metastatic adenocarcinoma of the lung: Follows with Dr. Earlie Server.  Currently on chemotherapy.  Follows as an outpatient with oncology.  Hypertension: Currently blood pressure stable.  Will continue his home medication  Anemia: Currently H&H stable.  Patient has history of GI bleed.  He was admitted with hematemesis on 12/16/16.  Underwent upper endoscopy on 12/17/16 which showed nonbleeding erosive gastropathy.  Follows with gastroenterology as an outpatient.  Anemia is also likely associated with his lung cancer.    Acute kidney injury: Slightly worsening kidney function.  We will continue to monitor.    Constipation: On MiraLAX  Severe malnutrition: Nutrition following    DVT prophylaxis: SCD Code Status: DNR/DNI Family Communication: Family members were present on the bedside . disposition Plan: Shelter .  Not ready  for discharge at.  Might need home oxygen upon discharge  Consultants: None  Procedures: Bilateral lower extremities Doppler negative for DVT done on 01/01/17     Antimicrobials: None  Subjective: Patient seen and examined  the bedside this morning.  Dyspnea slightly improved today.  He desaturates easily on ambulation.  Currently saturating 92% to 94% on supplemental oxygen.  Discussed with RN today for qualification for home oxygen. Cough has improved.  Chest pain and abdominal pain have improved.  Objective: Vitals:   01/02/17 2056 01/02/17 2148 01/03/17 0448 01/03/17 1321  BP: 134/69  111/64 106/76  Pulse: 83  80 78  Resp: 20  20 20   Temp: 98.6 F (37 C)  98.4 F (36.9 C) 98.4 F (36.9 C)  TempSrc: Oral  Oral Oral  SpO2: 100% 90% 97% 98%  Weight:      Height:        Intake/Output Summary (Last 24 hours) at 01/03/2017 1407 Last data filed at 01/03/2017 1116 Gross per 24 hour  Intake 1782.5 ml  Output 2750 ml  Net -967.5 ml   Filed Weights   12/30/16 1452  Weight: 53.1 kg (117 lb)    Examination:  General exam: Appears calm and comfortable  Respiratory system: Cough, fine crackles on left lung base ,Bilateral decreased air entry on the bases,respiratory effort normal. Cardiovascular system: S1 & S2 heard, RRR. No JVD, murmurs, rubs, gallops or clicks. No pedal edema, Chemo-Port on the right side Gastrointestinal system: Abdomen is nondistended, soft and nontender. No organomegaly or masses felt. Normal bowel sounds heard. Central nervous system: Alert and oriented. No focal neurological deficits. Extremities: Symmetric 5 x 5 power. Skin: No rashes, lesions or ulcers Psychiatry: Judgement and insight appear normal. Mood & affect appropriate.     Data Reviewed: I have personally reviewed following labs and imaging studies  CBC: Recent Labs  Lab 12/30/16 1603 12/31/16 0634 12/31/16 1255 01/01/17 0400 01/03/17 0816  WBC 7.5 6.3  --  7.0 7.6  NEUTROABS  --   --   --   --  5.2  HGB 7.5* 7.8* 8.6* 8.1* 8.3*  HCT 21.5* 22.8* 25.3* 23.8* 25.1*  MCV 104.9* 103.2*  --  103.5* 105.9*  PLT 234 233  --  257 633   Basic Metabolic Panel: Recent Labs  Lab 12/30/16 1603 12/31/16 0634  01/01/17 0400 01/03/17 0816  NA 136 139 138 138  K 3.2* 3.7 3.6 4.1  CL 103 107 102 102  CO2 27 27 28 29   GLUCOSE 89 93 99 114*  BUN 7 7 7 12   CREATININE 1.04 1.15 1.27* 1.31*  CALCIUM 8.6* 8.5* 8.7* 9.3  MG 1.7  --   --   --    GFR: Estimated Creatinine Clearance: 46.7 mL/min (A) (by C-G formula based on SCr of 1.31 mg/dL (H)). Liver Function Tests: Recent Labs  Lab 12/30/16 1603 12/31/16 0634 01/01/17 0400  AST 50* 41 39  ALT 63 49 43  ALKPHOS 147* 119 134*  BILITOT 0.4 0.2* 0.5  PROT 7.0 6.2* 6.8  ALBUMIN 2.9* 2.5* 2.7*   Recent Labs  Lab 12/30/16 1603  LIPASE 20   No results for input(s): AMMONIA in the last 168 hours. Coagulation Profile: No results for input(s): INR, PROTIME in the last 168 hours. Cardiac Enzymes: Recent Labs  Lab 12/31/16 0055 12/31/16 0634 12/31/16 1255  TROPONINI <0.03 <0.03 <0.03   BNP (last 3 results) No results for input(s): PROBNP in the last 8760 hours. HbA1C: No results  for input(s): HGBA1C in the last 72 hours. CBG: Recent Labs  Lab 12/31/16 0742 01/01/17 0751  GLUCAP 81 106*   Lipid Profile: No results for input(s): CHOL, HDL, LDLCALC, TRIG, CHOLHDL, LDLDIRECT in the last 72 hours. Thyroid Function Tests: No results for input(s): TSH, T4TOTAL, FREET4, T3FREE, THYROIDAB in the last 72 hours. Anemia Panel: No results for input(s): VITAMINB12, FOLATE, FERRITIN, TIBC, IRON, RETICCTPCT in the last 72 hours. Sepsis Labs: No results for input(s): PROCALCITON, LATICACIDVEN in the last 168 hours.  Recent Results (from the past 240 hour(s))  TECHNOLOGIST REVIEW     Status: None   Collection Time: 12/27/16  8:58 AM  Result Value Ref Range Status   Technologist Review 1% myelo seen  Final         Radiology Studies: No results found.      Scheduled Meds: . amLODipine  10 mg Oral Daily  . chlorhexidine  15 mL Mouth Rinse BID  . feeding supplement (ENSURE ENLIVE)  237 mL Oral TID BM  . furosemide  20 mg  Intravenous Once  . [START ON 01/04/2017] furosemide  20 mg Oral Daily  . gabapentin  300 mg Oral QHS  . loratadine  10 mg Oral Daily  . mouth rinse  15 mL Mouth Rinse q12n4p  . mometasone-formoterol  2 puff Inhalation BID  . multivitamin with minerals  1 tablet Oral Daily  . pantoprazole  40 mg Oral BID  . polyethylene glycol  17 g Oral Daily   Continuous Infusions: . sodium chloride 10 mL/hr at 01/01/17 1457     LOS: 3 days    Time spent: 25 minutes     Casidee Jann Jodie Echevaria, MD [facility] Pager [phone]  If 7PM-7AM, please contact night-coverage [email or web address] Password [email or web address] 01/03/2017, 2:07 PM

## 2017-01-03 NOTE — Progress Notes (Signed)
Initial Nutrition Assessment  DOCUMENTATION CODES:   Underweight, Severe malnutrition in context of chronic illness  INTERVENTION:   Ensure Enlive po TID, each supplement provides 350 kcal and 20 grams of protein Provide MVI daily  NUTRITION DIAGNOSIS:   Severe Malnutrition related to chronic illness, cancer and cancer related treatments as evidenced by 14% weight loss in 4 months, severe muscle depletion, moderate fat depletion, severe fat depletion.  GOAL:   Patient will meet greater than or equal to 90% of their needs    MONITOR:   PO intake, Supplement acceptance, Weight trends, Labs  REASON FOR ASSESSMENT:   Other (Comment)(BMI)   ASSESSMENT:   Pt with PMH significant for stage IV adenocarcinoma of the lung (currently on chemotherapy), HTN, seizures, erosive gastropathy/gastritis, and homelessness. Recently admitted 11/26 for worsening epigastric pain. EGD found gastritis and erosive gastropathy. Presents this admission with dyspnea on exertion. CXR showed small bilateral effusions and heterogenous opacities in L lung base.    Spoke with pt who reports appetite has decreased since last admission. States he typically eats 5 meals per day but after discharge he "did not have the taste for anything." Admits to having some taste changes. Unable to provide dietary recall. Meal completions charted as 25-100%. Pt amendable to supplementation as he would like to gain weight. Pt does not wish to have miralax as he thinks this is making him lose weight. Denies having trouble obtaining meals outside of the hospital. States he lives in the shelter and they provide all the food he wants. Unsure of validity.   Pt reports he has lost 15 lb since August 2018. Records indicate pt has lost 19 lb since 09/02/16. This shows a 14% weight loss in 4 months which is significant.   Nutrition-Focused physical exam completed.  Medications reviewed and include: miralax Labs reviewed: Creatinine 1.31  (H) ALP 134 (H)   NUTRITION - FOCUSED PHYSICAL EXAM:    Most Recent Value  Orbital Region  Mild depletion  Upper Arm Region  Severe depletion  Thoracic and Lumbar Region  Unable to assess  Buccal Region  Moderate depletion  Temple Region  Moderate depletion  Clavicle Bone Region  Severe depletion  Clavicle and Acromion Bone Region  Severe depletion  Scapular Bone Region  Unable to assess  Dorsal Hand  Moderate depletion  Patellar Region  Severe depletion  Anterior Thigh Region  Severe depletion  Posterior Calf Region  Severe depletion  Edema (RD Assessment)  None  Hair  Reviewed  Eyes  Reviewed  Mouth  Reviewed  Skin  Reviewed  Nails  Reviewed       Diet Order:  Diet regular Room service appropriate? Yes; Fluid consistency: Thin  EDUCATION NEEDS:   Education needs have been addressed  Skin:  Skin Assessment: Reviewed RN Assessment  Last BM:  01/02/17  Height:   Ht Readings from Last 1 Encounters:  12/30/16 5\' 11"  (1.803 m)    Weight:   Wt Readings from Last 1 Encounters:  12/30/16 117 lb (53.1 kg)    Ideal Body Weight:  78.2 kg  BMI:  Body mass index is 16.32 kg/m.  Estimated Nutritional Needs:   Kcal:  1900-2100 kcal/day  Protein:  90-100 g/day  Fluid:  >1.9 L/day    Mariana Single RD, LDN Clinical Nutrition Pager # - [phone]

## 2017-01-03 NOTE — Progress Notes (Signed)
PT Cancellation Note  Patient Details Name: [name] MRN: [id number] DOB: 1960/04/09   Cancelled Treatment:    Reason Eval/Treat Not Completed: Patient declined, no reason specified   Weston Anna, MPT Pager: [phone]

## 2017-01-03 NOTE — Progress Notes (Signed)
Pt O2 sat while doing any activity on 2L oxygen desats to 80-85%.  Pt has to be increased to 4L oxygen to get O2 sat back up to 90-95%.  Pt at rest on 2L oxygen is 91-95%.

## 2017-01-03 NOTE — Procedures (Signed)
Ultrasound-guided diagnostic and therapeutic right thoracentesis performed yielding 560 cc of turbid, blood-tinged fluid. No immediate complications. Follow-up chest x-ray pending. The fluid was sent to the lab for preordered studies.

## 2017-01-03 NOTE — Progress Notes (Signed)
Appointment with [facility] ([place]) 12/21 at 0900 AM.

## 2017-01-03 NOTE — Clinical Social Work Note (Signed)
Clinical Social Work Assessment  Patient Details  Name: [name] MRN: [id number] Date of Birth: 03-Apr-1960  Date of referral:  01/03/17               Reason for consult:  Housing Concerns/Homelessness, Facility Placement                Permission sought to share information with:  Case Manager Permission granted to share information::  Yes, Verbal Permission Granted  Name::        Agency::     Relationship::     Contact Information:     Housing/Transportation Living arrangements for the past 2 months:  Homeless Company secretary) Source of Information:  Patient Patient Interpreter Needed:  None Criminal Activity/Legal Involvement Pertinent to Current Situation/Hospitalization:  No - Comment as needed Significant Relationships:  Siblings, Friend, Other Family Members Lives with:  Self, Other (Comment)(Resident at [facility]) Do you feel safe going back to the place where you live?  Yes Need for family participation in patient care:  No (Coment)  Care giving concerns:  CSW consulted for homeless issues. Patient currently residing at [facility]. PT recommended SNF for ST rehab, patient declined. Patient reported that he will need a walker to help him ambulate, RNCM notified.    Social Worker assessment / plan:  CSW spoke with patient at bedside regarding discharge planning and PT recommendation for SNF. CSW explained SNF placement process and patient's payor source. CSW informed patient that he we would need to use his Medicaid and SSI check to cover SNF expenses, patient reported that he is not turing over his check to a SNF. Patient continuously declined SNF and reported that he plans on returning to the shelter. Patient reported that he would need a walker at shelter, [facility] notified. Patient declined SNF, CSW signing off no other needs identified at this time.   Employment status:  Unemployed Forensic scientist:  Medicaid In [place] PT Recommendations:   [place] / Referral to community resources:     Patient/Family's Response to care:  Patient declined SNF and reported that he plans on returning to shelter. Patient agreeable to [facility] assistance with obtaining DME, RNCM aware.   Patient/Family's Understanding of and Emotional Response to Diagnosis, Current Treatment, and Prognosis:  Patient became agitated when speaking to CSW about SNF placement and needing to use his check to pay for SNF. CSW validated patient's concerns about turning over his check and apologized for upsetting patient. Patient verbalized plan to dc back to homeless shelter.   Emotional Assessment Appearance:  Appears stated age Attitude/Demeanor/Rapport:    Affect (typically observed):  Agitated Orientation:  Oriented to Self, Oriented to Place, Oriented to  Time, Oriented to Situation Alcohol / Substance use:  Not Applicable Psych involvement (Current and /or in the community):  No (Comment)  Discharge Needs  Concerns to be addressed:  Discharge Planning Concerns Readmission within the last 30 days:  Yes Current discharge risk:  Physical Impairment Barriers to Discharge:  No Barriers Identified   [name], LCSW 01/03/2017, 10:23 AM

## 2017-01-03 NOTE — Progress Notes (Signed)
Physical Therapy Treatment Patient Details Name: [name] MRN: [id number] DOB: February 06, 1960 Today's Date: 01/03/2017    History of Present Illness 56 yo male admitted with dyspnea on exertion. Hx of met lung cancer, Sz.     PT Comments    Progressing slowly with activity tolerance. O2 sat level continue to drop with activity. O2 sat 91% 2L [place] O2 at rest, 86% on 3L [place] O2 during ambulation on today.  Continue to recommend rollator use for ambulation for energy conservation and ability to sit and rest when needed. Pt declines SNF placement.    Follow Up Recommendations  No PT follow up (pt declines placement)     Equipment Recommendations  4 wheeled rolling walker    Recommendations for Other Services       Precautions / Restrictions Precautions Precautions: None Precaution Comments: monitor O2 sats Restrictions Weight Bearing Restrictions: No    Mobility  Bed Mobility Overal bed mobility: Modified Independent                Transfers Overall transfer level: Needs assistance     Sit to Stand: Supervision         General transfer comment: for safe use of rollator, lines/equipment  Ambulation/Gait Ambulation/Gait assistance: Supervision Ambulation Distance (Feet): 75 Feet(x2) Assistive device: 4-wheeled walker Gait Pattern/deviations: Step-through pattern     General Gait Details: Close guard for safety. Seated rest break needed/taken after 75 feet. O2 sats dropped to 86% on 3L  O2 during ambulation. Dyspnea 2/4.    Stairs            Wheelchair Mobility    Modified Rankin (Stroke Patients Only)       Balance                                            Cognition Arousal/Alertness: Awake/alert Behavior During Therapy: WFL for tasks assessed/performed Overall Cognitive Status: Within Functional Limits for tasks assessed                                        Exercises      General Comments         Pertinent Vitals/Pain Pain Assessment: No/denies pain    Home Living                      Prior Function            PT Goals (current goals can now be found in the care plan section) Progress towards PT goals: Progressing toward goals    Frequency    Min 3X/week      PT Plan Current plan remains appropriate    Co-evaluation              AM-PAC PT "6 Clicks" Daily Activity  Outcome Measure  Difficulty turning over in bed (including adjusting bedclothes, sheets and blankets)?: None Difficulty moving from lying on back to sitting on the side of the bed? : None Difficulty sitting down on and standing up from a chair with arms (e.g., wheelchair, bedside commode, etc,.)?: None Help needed moving to and from a bed to chair (including a wheelchair)?: None Help needed walking in hospital room?: A Little Help needed climbing 3-5 steps with a railing? : A Little  6 Click Score: 22    End of Session Equipment Utilized During Treatment: Oxygen Activity Tolerance: Patient limited by fatigue(limited by dyspnea) Patient left: in bed;with call bell/phone within reach   PT Visit Diagnosis: Other abnormalities of gait and mobility (R26.89)     Time: 6433-2951 PT Time Calculation (min) (ACUTE ONLY): 21 min  Charges:  $Gait Training: 8-22 mins                    G Codes:        Weston Anna, MPT Pager: [phone]

## 2017-01-04 DIAGNOSIS — J90 Pleural effusion, not elsewhere classified: Secondary | ICD-10-CM

## 2017-01-04 DIAGNOSIS — C3492 Malignant neoplasm of unspecified part of left bronchus or lung: Principal | ICD-10-CM

## 2017-01-04 DIAGNOSIS — E43 Unspecified severe protein-calorie malnutrition: Secondary | ICD-10-CM

## 2017-01-04 DIAGNOSIS — K2970 Gastritis, unspecified, without bleeding: Secondary | ICD-10-CM

## 2017-01-04 DIAGNOSIS — Z720 Tobacco use: Secondary | ICD-10-CM

## 2017-01-04 DIAGNOSIS — J449 Chronic obstructive pulmonary disease, unspecified: Secondary | ICD-10-CM

## 2017-01-04 DIAGNOSIS — I10 Essential (primary) hypertension: Secondary | ICD-10-CM

## 2017-01-04 DIAGNOSIS — Z9889 Other specified postprocedural states: Secondary | ICD-10-CM

## 2017-01-04 LAB — BASIC METABOLIC PANEL
ANION GAP: 7 (ref 5–15)
BUN: 11 mg/dL (ref 6–20)
CHLORIDE: 99 mmol/L — AB (ref 101–111)
CO2: 32 mmol/L (ref 22–32)
CREATININE: 1.24 mg/dL (ref 0.61–1.24)
Calcium: 8.8 mg/dL — ABNORMAL LOW (ref 8.9–10.3)
GFR calc non Af Amer: >60 mL/min (ref >60)
Glucose, Bld: 107 mg/dL — ABNORMAL HIGH (ref 65–99)
POTASSIUM: 4.3 mmol/L (ref 3.5–5.1)
SODIUM: 138 mmol/L (ref 135–145)

## 2017-01-04 NOTE — Progress Notes (Signed)
PROGRESS NOTE  [name]  XKG:[id number] DOB: 25-Apr-1960 DOA: 12/30/2016 PCP: Patient, No Pcp Per   Brief Narrative: [name] is a 56 y.o. male with a history of stage IV NSCLC undergoing chemotherapy, HTN, seizure disorder, gastritis and homelessness who presented with dyspnea on exertion. Work up demonstrated emphysema and pulmonary edema with pleural effusion with no evidence of cancer progression or consolidation. Dyspnea improved with diuretic, though effusion persisted prompting thoracentesis 12/14.   Assessment & Plan: Principal Problem:   DOE (dyspnea on exertion) Active Problems:   Tobacco abuse   Adenocarcinoma of left lung, stage 4 (HCC)   Abdominal pain   COPD (chronic obstructive pulmonary disease) (HCC)   HTN (hypertension)   Gastritis   Severe malnutrition (HCC)  Acute hypoxic respiratory failure: Due to pulmonary edema, pleural effusion, and underlying emphysema/COPD. Did not qualify for oxygen at discharge last admission for same.  - s/p thoracentesis 12/14 yielding 560cc turbid, blood-tinged fluid with exudative properties. No organisms on gram stain, no evidence of consolidation, leukocytosis or fever argues against parapneumonic effusion, likely instead related to malignancy. Will monitor culture (no growth x1 day) off abx.  - Still with crackles on exam, will give lasix today and monitor UOP/BMP in AM.  - Plan to ambulate with pulse oximetry prior to discharge.   Acute on chronic diastolic CHF: F0YO w/preserved LVEF on previous echo.  - Plan to discharge on lasix if stable in AM.  COPD: No wheezing to suggest exacerbation.  - Continue breathing treatments  Stage IV NSCLC: Undergoing chemotherapy per [name]. s/p XRT.  - Monitor cytology of pleural fluid, suspect exudate may be malignancy-associated.   HTN: Chronic, stable.  - Continue current management  Anemia: Likely related to malignancy, chemotherapy, and recent GI bleeding (erosive  gastropathy on EGD) - continue PPI - Monitor CBC intermittently as it is stable and no active bleeding.   AKI:  - Monitor with lasix.   Severe protein-calorie malnutrition:  - RD consulted  Constipation:  - Continue bowel regimen  DVT prophylaxis: SCDs Code Status: DNR Family Communication: None at bedside. Disposition Plan: DC to [facility] once respiratory status improved. Hopeful for 12/16 AM. Pt declined SNF, as recommended by PT.  Consultants:   IR  Procedures:   Right thoracentesis 01/03/2017  LE Doppler U/S 01/01/2017: No DVT.  Antimicrobials:  None  Subjective: Dyspnea improved after thoracentesis, but not at baseline, significantly worse with any exertion. Will plan to ambulate more today in preparation for DC tomorrow. Continues to decline SNF as he does not wish to relinquish his monthly check. Denies chest pain  Objective: Vitals:   01/03/17 2023 01/03/17 2136 01/04/17 0538 01/04/17 1014  BP:  118/64 (!) 111/57   Pulse:  91 78   Resp:  20 16   Temp:  99.3 F (37.4 C) 98.7 F (37.1 C)   TempSrc:  Oral Oral   SpO2: 98% 95% 95% 100%  Weight:      Height:        Intake/Output Summary (Last 24 hours) at 01/04/2017 1251 Last data filed at 01/04/2017 1000 Gross per 24 hour  Intake 730 ml  Output 2400 ml  Net -1670 ml   Filed Weights   12/30/16 1452  Weight: 53.1 kg (117 lb)    Gen: Chronically ill-appearing, pleasant male in no distress Pulm: Nonlabored with supplemental O2. Diminished aeration at bases with scant crackles.  CV: Regular rate and rhythm. No murmur, rub, or gallop. No  JVD, no pedal edema. GI: Abdomen soft, non-tender, non-distended, with normoactive bowel sounds. No organomegaly or masses felt. Ext: Warm, no deformities Skin: Right chest port without discharge or erythema. Neuro: Alert and oriented. No focal neurological deficits. Psych: Judgement and insight appear normal. Mood & affect appropriate.    Data Reviewed: I have personally reviewed following labs and imaging studies  CBC: Recent Labs  Lab 12/30/16 1603 12/31/16 0634 12/31/16 1255 01/01/17 0400 01/03/17 0816  WBC 7.5 6.3  --  7.0 7.6  NEUTROABS  --   --   --   --  5.2  HGB 7.5* 7.8* 8.6* 8.1* 8.3*  HCT 21.5* 22.8* 25.3* 23.8* 25.1*  MCV 104.9* 103.2*  --  103.5* 105.9*  PLT 234 233  --  257 355   Basic Metabolic Panel: Recent Labs  Lab 12/30/16 1603 12/31/16 0634 01/01/17 0400 01/03/17 0816 01/04/17 0253  NA 136 139 138 138 138  K 3.2* 3.7 3.6 4.1 4.3  CL 103 107 102 102 99*  CO2 27 27 28 29  32  GLUCOSE 89 93 99 114* 107*  BUN 7 7 7 12 11   CREATININE 1.04 1.15 1.27* 1.31* 1.24  CALCIUM 8.6* 8.5* 8.7* 9.3 8.8*  MG 1.7  --   --   --   --    GFR: Estimated Creatinine Clearance: 49.4 mL/min (by C-G formula based on SCr of 1.24 mg/dL). Liver Function Tests: Recent Labs  Lab 12/30/16 1603 12/31/16 0634 01/01/17 0400  AST 50* 41 39  ALT 63 49 43  ALKPHOS 147* 119 134*  BILITOT 0.4 0.2* 0.5  PROT 7.0 6.2* 6.8  ALBUMIN 2.9* 2.5* 2.7*   Recent Labs  Lab 12/30/16 1603  LIPASE 20   No results for input(s): AMMONIA in the last 168 hours. Coagulation Profile: No results for input(s): INR, PROTIME in the last 168 hours. Cardiac Enzymes: Recent Labs  Lab 12/31/16 0055 12/31/16 0634 12/31/16 1255  TROPONINI <0.03 <0.03 <0.03   BNP (last 3 results) No results for input(s): PROBNP in the last 8760 hours. HbA1C: No results for input(s): HGBA1C in the last 72 hours. CBG: Recent Labs  Lab 12/31/16 0742 01/01/17 0751  GLUCAP 81 106*   Lipid Profile: No results for input(s): CHOL, HDL, LDLCALC, TRIG, CHOLHDL, LDLDIRECT in the last 72 hours. Thyroid Function Tests: No results for input(s): TSH, T4TOTAL, FREET4, T3FREE, THYROIDAB in the last 72 hours. Anemia Panel: No results for input(s): VITAMINB12, FOLATE, FERRITIN, TIBC, IRON, RETICCTPCT in the last 72 hours. Urine analysis:    Component  Value Date/Time   COLORURINE YELLOW 12/30/2016 2049   APPEARANCEUR CLEAR 12/30/2016 2049   LABSPEC 1.005 12/30/2016 2049   PHURINE 7.0 12/30/2016 2049   GLUCOSEU NEGATIVE 12/30/2016 2049   HGBUR NEGATIVE 12/30/2016 2049   BILIRUBINUR NEGATIVE 12/30/2016 2049   [place] NEGATIVE 12/30/2016 2049   PROTEINUR NEGATIVE 12/30/2016 2049   UROBILINOGEN 0.2 05/29/2011 0122   NITRITE NEGATIVE 12/30/2016 2049   LEUKOCYTESUR NEGATIVE 12/30/2016 2049   Recent Results (from the past 240 hour(s))  TECHNOLOGIST REVIEW     Status: None   Collection Time: 12/27/16  8:58 AM  Result Value Ref Range Status   Technologist Review 1% myelo seen  Final  Body fluid culture     Status: None (Preliminary result)   Collection Time: 01/03/17  4:48 PM  Result Value Ref Range Status   Specimen Description PLEURAL  Final   Special Requests NONE  Final   Gram Stain   Final  ABUNDANT WBC PRESENT, PREDOMINANTLY MONONUCLEAR NO ORGANISMS SEEN    Culture   Final    NO GROWTH 1 DAY Performed at [facility] [address], [place], [place] [address]    Report Status PENDING  Incomplete      Radiology Studies: Dg Chest 1 View  Result Date: 01/03/2017 CLINICAL DATA:  Status post right-sided thoracentesis. EXAM: CHEST 1 VIEW COMPARISON:  Chest x-ray 12/30/2016 FINDINGS: The right-sided power port is stable. The cardiac silhouette, mediastinal and hilar contours are within normal limits and stable. Stable emphysematous changes and pulmonary scarring. Stable left apical scarring changes. No residual right pleural effusions identified. No postprocedural pneumothorax. Small left effusion. IMPRESSION: Evacuation of right pleural fluid.  No postprocedural pneumothorax. Small left effusion. Electronically Signed   By: Marijo Sanes M.D.   On: 01/03/2017 17:13   US Thoracentesis Asp Pleural Space W/img Guide  Result Date: 01/03/2017 INDICATION: Lung cancer, dyspnea, right pleural effusion; request made for  diagnostic and therapeutic right thoracentesis EXAM: ULTRASOUND GUIDED DIAGNOSTIC AND THERAPEUTIC RIGHT THORACENTESIS MEDICATIONS: None. COMPLICATIONS: None immediate. PROCEDURE: An ultrasound guided thoracentesis was thoroughly discussed with the patient and questions answered. The benefits, risks, alternatives and complications were also discussed. The patient understands and wishes to proceed with the procedure. Written consent was obtained. Ultrasound was performed to localize and mark an adequate pocket of fluid in the right chest. The area was then prepped and draped in the normal sterile fashion. 1% Lidocaine was used for local anesthesia. Under ultrasound guidance a Safe-T-Centesis catheter was introduced. Thoracentesis was performed. The catheter was removed and a dressing applied. FINDINGS: A total of approximately 560 cc of turbid, blood-tinged fluid was removed. Samples were sent to the laboratory as requested by the clinical team. IMPRESSION: Successful ultrasound guided diagnostic and therapeutic right thoracentesis yielding 560 cc of pleural fluid. Read by: Rowe Robert, PA-C Electronically Signed   By: Sandi Mariscal M.D.   On: 01/03/2017 16:38    Scheduled Meds: . amLODipine  10 mg Oral Daily  . chlorhexidine  15 mL Mouth Rinse BID  . feeding supplement (ENSURE ENLIVE)  237 mL Oral TID BM  . furosemide  20 mg Oral Daily  . gabapentin  300 mg Oral QHS  . loratadine  10 mg Oral Daily  . mouth rinse  15 mL Mouth Rinse q12n4p  . mometasone-formoterol  2 puff Inhalation BID  . multivitamin with minerals  1 tablet Oral Daily  . pantoprazole  40 mg Oral BID  . polyethylene glycol  17 g Oral Daily   Continuous Infusions: . sodium chloride 10 mL/hr at 01/01/17 1457     LOS: 4 days   Time spent: 25 minutes.  Vance Gather, MD [facility] Pager [phone]  If 7PM-7AM, please contact night-coverage [email or web address] Password [email or web address] 01/04/2017, 12:51 PM

## 2017-01-04 NOTE — Progress Notes (Addendum)
SATURATION QUALIFICATIONS: (This note is used to comply with regulatory documentation for home oxygen)  Patient Saturations on Room Air at Rest = 85%  Patient Saturations on Room Air while Ambulating = n/a  Patient Saturations on 2 Liters of oxygen while Ambulating = 92-95%  Please briefly explain why patient needs home oxygen: desaturation on room air at rest   Patient ambulated 132ft with oxygen. O2 sats maintained 92-95% on 2L/min via nasal cannula. Patient c/o SOB with ambulation and did have to take a rest break about halfway, otherwise the patient tolerated ambulation well.   Patient states he is planning to return to [facility] at discharge. Unsure if or how oxygen can be set up there as he reports that he does not have a private room and there are about 100 other people there.   Othella Boyer [facility] 01/04/2017 6:32 PM

## 2017-01-05 LAB — BASIC METABOLIC PANEL
ANION GAP: 7 (ref 5–15)
BUN: 14 mg/dL (ref 6–20)
CALCIUM: 9 mg/dL (ref 8.9–10.3)
CO2: 34 mmol/L — ABNORMAL HIGH (ref 22–32)
Chloride: 98 mmol/L — ABNORMAL LOW (ref 101–111)
Creatinine, Ser: 1.44 mg/dL — ABNORMAL HIGH (ref 0.61–1.24)
GFR calc Af Amer: >60 mL/min (ref >60)
GFR, EST NON AFRICAN AMERICAN: 53 mL/min — AB (ref >60)
Glucose, Bld: 96 mg/dL (ref 65–99)
Potassium: 4.5 mmol/L (ref 3.5–5.1)
Sodium: 139 mmol/L (ref 135–145)

## 2017-01-05 MED ORDER — BISACODYL 5 MG PO TBEC
10.0000 mg | DELAYED_RELEASE_TABLET | Freq: Once | ORAL | Status: AC
  Administered 2017-01-05: 10 mg via ORAL
  Filled 2017-01-05: qty 2

## 2017-01-05 NOTE — Progress Notes (Signed)
PROGRESS NOTE  [name]  ZOX:[id number] DOB: Jun 01, 1960 DOA: 12/30/2016 PCP: Patient, No Pcp Per   Brief Narrative: [name] is a 56 y.o. male with a history of stage IV NSCLC undergoing chemotherapy, HTN, seizure disorder, gastritis and homelessness who presented with dyspnea on exertion. Work up demonstrated emphysema and pulmonary edema with pleural effusion with no evidence of cancer progression or consolidation. Dyspnea improved with diuretic, though effusion persisted prompting thoracentesis 12/14. Hypoxia does remain, multifactorial, and the patient will require supplemental oxygen at discharge. With his refusal to discharge to a nursing facility and his homelessness, this presents logistical problems/barriers to discharge.  Assessment & Plan: Principal Problem:   DOE (dyspnea on exertion) Active Problems:   Tobacco abuse   Adenocarcinoma of left lung, stage 4 (HCC)   Abdominal pain   COPD (chronic obstructive pulmonary disease) (HCC)   HTN (hypertension)   Gastritis   Severe malnutrition (HCC)  Acute hypoxic respiratory failure: Due to pulmonary edema, pleural effusion, and underlying emphysema/COPD. Did not qualify for oxygen at discharge last admission for same.  - s/p thoracentesis 12/14 yielding 560cc turbid, blood-tinged fluid with exudative properties. No organisms on gram stain, no evidence of consolidation, leukocytosis or fever argues against parapneumonic effusion, likely instead related to malignancy. Will monitor culture (no growth x2 days) off abx.  - Crackles improved, but remain, though creatinine bumped further with diuretic, so will need to stop lasix. Cotninue monitor UOP/BMP in AM. If stable/improving, ok to DC.  - Qualifies for home oxygen. Care management consulted.   AKI: Due to diuresis. - Need to monitor BMP in AM.  Acute on chronic diastolic CHF: W1XB w/preserved LVEF on previous echo.  - Does not appear to need daily lasix, perhaps  QOD.  COPD: No wheezing to suggest exacerbation.  - Continue breathing treatments  Stage IV NSCLC: Undergoing chemotherapy per [name]. s/p XRT.  - Monitor cytology of pleural fluid, suspect exudate may be malignancy-associated.   HTN: Chronic, stable.  - Continue current management  Anemia: Likely related to malignancy, chemotherapy, and recent GI bleeding (erosive gastropathy on EGD) - Continue PPI - Monitor CBC intermittently as it is stable and no active bleeding.   AKI:  - Monitor with lasix.   Severe protein-calorie malnutrition:  - RD consulted  Constipation:  - Continue bowel regimen  DVT prophylaxis: SCDs Code Status: DNR Family Communication: None at bedside. Disposition Plan: DC to [facility] once respiratory status improved. Hopeful for 12/17 AM. Pt declined SNF, as recommended by PT and will need "home" oxygen. Care management will need to work on arranging this on Monday.  Consultants:   IR  Procedures:   Right thoracentesis 01/03/2017  LE Doppler U/S 01/01/2017: No DVT.  Antimicrobials:  None  Subjective: Dyspnea is severe, worse with any exertion. Had a severe dyspneic episode earlier this morning when up without oxygen. Continues to refuse DC to SNF. No chest pain.  Objective: Vitals:   01/04/17 2039 01/05/17 0539 01/05/17 0844 01/05/17 1329  BP: 115/65 110/69  127/73  Pulse: 76 80  84  Resp: 18 18  18   Temp: 98.5 F (36.9 C) 98.9 F (37.2 C)  98.6 F (37 C)  TempSrc: Oral Oral  Oral  SpO2: 100% 100% 98% 95%  Weight:      Height:        Intake/Output Summary (Last 24 hours) at 01/05/2017 1811 Last data filed at 01/05/2017 1752 Gross per 24 hour  Intake 790 ml  Output 2325 ml  Net -1535 ml   Filed Weights   12/30/16 1452  Weight: 53.1 kg (117 lb)    Gen: Chronically ill-appearing, pleasant male in no distress, resting quietly supine. Pulm: Nonlabored with supplemental O2. Diminished aeration at bases  with scant crackles. Otherwise good air movement. CV: Regular rate and rhythm. No murmur, rub, or gallop. No JVD, no pedal edema. GI: Abdomen soft, non-tender, non-distended, with normoactive bowel sounds. No organomegaly or masses felt. Ext: Warm, no deformities Skin: Right chest port without discharge or erythema. Neuro: Alert and oriented. No focal neurological deficits. Psych: Judgement and insight appear normal. Mood & affect appropriate.   Data Reviewed: I have personally reviewed following labs and imaging studies  CBC: Recent Labs  Lab 12/30/16 1603 12/31/16 0634 12/31/16 1255 01/01/17 0400 01/03/17 0816  WBC 7.5 6.3  --  7.0 7.6  NEUTROABS  --   --   --   --  5.2  HGB 7.5* 7.8* 8.6* 8.1* 8.3*  HCT 21.5* 22.8* 25.3* 23.8* 25.1*  MCV 104.9* 103.2*  --  103.5* 105.9*  PLT 234 233  --  257 528   Basic Metabolic Panel: Recent Labs  Lab 12/30/16 1603 12/31/16 0634 01/01/17 0400 01/03/17 0816 01/04/17 0253 01/05/17 0309  NA 136 139 138 138 138 139  K 3.2* 3.7 3.6 4.1 4.3 4.5  CL 103 107 102 102 99* 98*  CO2 27 27 28 29  32 34*  GLUCOSE 89 93 99 114* 107* 96  BUN 7 7 7 12 11 14   CREATININE 1.04 1.15 1.27* 1.31* 1.24 1.44*  CALCIUM 8.6* 8.5* 8.7* 9.3 8.8* 9.0  MG 1.7  --   --   --   --   --    GFR: Estimated Creatinine Clearance: 42.5 mL/min (A) (by C-G formula based on SCr of 1.44 mg/dL (H)). Liver Function Tests: Recent Labs  Lab 12/30/16 1603 12/31/16 0634 01/01/17 0400  AST 50* 41 39  ALT 63 49 43  ALKPHOS 147* 119 134*  BILITOT 0.4 0.2* 0.5  PROT 7.0 6.2* 6.8  ALBUMIN 2.9* 2.5* 2.7*   Recent Labs  Lab 12/30/16 1603  LIPASE 20   No results for input(s): AMMONIA in the last 168 hours. Coagulation Profile: No results for input(s): INR, PROTIME in the last 168 hours. Cardiac Enzymes: Recent Labs  Lab 12/31/16 0055 12/31/16 0634 12/31/16 1255  TROPONINI <0.03 <0.03 <0.03   BNP (last 3 results) No results for input(s): PROBNP in the last 8760  hours. HbA1C: No results for input(s): HGBA1C in the last 72 hours. CBG: Recent Labs  Lab 12/31/16 0742 01/01/17 0751  GLUCAP 81 106*   Lipid Profile: No results for input(s): CHOL, HDL, LDLCALC, TRIG, CHOLHDL, LDLDIRECT in the last 72 hours. Thyroid Function Tests: No results for input(s): TSH, T4TOTAL, FREET4, T3FREE, THYROIDAB in the last 72 hours. Anemia Panel: No results for input(s): VITAMINB12, FOLATE, FERRITIN, TIBC, IRON, RETICCTPCT in the last 72 hours. Urine analysis:    Component Value Date/Time   COLORURINE YELLOW 12/30/2016 2049   APPEARANCEUR CLEAR 12/30/2016 2049   LABSPEC 1.005 12/30/2016 2049   PHURINE 7.0 12/30/2016 2049   GLUCOSEU NEGATIVE 12/30/2016 2049   HGBUR NEGATIVE 12/30/2016 2049   [place] 12/30/2016 2049   [place] NEGATIVE 12/30/2016 2049   PROTEINUR NEGATIVE 12/30/2016 2049   UROBILINOGEN 0.2 05/29/2011 0122   NITRITE NEGATIVE 12/30/2016 2049   LEUKOCYTESUR NEGATIVE 12/30/2016 2049   Recent Results (from the past 240 hour(s))  TECHNOLOGIST REVIEW  Status: None   Collection Time: 12/27/16  8:58 AM  Result Value Ref Range Status   Technologist Review 1% myelo seen  Final  Body fluid culture     Status: None (Preliminary result)   Collection Time: 01/03/17  4:48 PM  Result Value Ref Range Status   Specimen Description PLEURAL  Final   Special Requests NONE  Final   Gram Stain   Final    ABUNDANT WBC PRESENT, PREDOMINANTLY MONONUCLEAR NO ORGANISMS SEEN    Culture   Final    NO GROWTH 2 DAYS Performed at [facility], [address] [place], [place] [address]    Report Status PENDING  Incomplete      Radiology Studies: No results found.  Scheduled Meds: . amLODipine  10 mg Oral Daily  . chlorhexidine  15 mL Mouth Rinse BID  . feeding supplement (ENSURE ENLIVE)  237 mL Oral TID BM  . furosemide  20 mg Oral Daily  . gabapentin  300 mg Oral QHS  . loratadine  10 mg Oral Daily  . mouth rinse  15 mL Mouth  Rinse q12n4p  . mometasone-formoterol  2 puff Inhalation BID  . multivitamin with minerals  1 tablet Oral Daily  . pantoprazole  40 mg Oral BID  . polyethylene glycol  17 g Oral Daily   Continuous Infusions: . sodium chloride 10 mL/hr at 01/01/17 1457     LOS: 5 days   Time spent: 25 minutes.  Vance Gather, MD [facility] Pager [phone]  If 7PM-7AM, please contact night-coverage [email or web address] Password [email or web address] 01/05/2017, 6:11 PM

## 2017-01-05 NOTE — Progress Notes (Signed)
Resumed care of pt. Agree with previous RN's assessment. Will continue with plan of care.

## 2017-01-06 DIAGNOSIS — Z5111 Encounter for antineoplastic chemotherapy: Secondary | ICD-10-CM

## 2017-01-06 DIAGNOSIS — C7951 Secondary malignant neoplasm of bone: Secondary | ICD-10-CM

## 2017-01-06 LAB — BASIC METABOLIC PANEL
ANION GAP: 8 (ref 5–15)
BUN: 15 mg/dL (ref 6–20)
CALCIUM: 9.1 mg/dL (ref 8.9–10.3)
CO2: 33 mmol/L — ABNORMAL HIGH (ref 22–32)
Chloride: 95 mmol/L — ABNORMAL LOW (ref 101–111)
Creatinine, Ser: 1.31 mg/dL — ABNORMAL HIGH (ref 0.61–1.24)
GFR calc Af Amer: >60 mL/min (ref >60)
GFR, EST NON AFRICAN AMERICAN: 59 mL/min — AB (ref >60)
GLUCOSE: 95 mg/dL (ref 65–99)
Potassium: 4.4 mmol/L (ref 3.5–5.1)
Sodium: 136 mmol/L (ref 135–145)

## 2017-01-06 MED ORDER — GUAIFENESIN-DM 100-10 MG/5ML PO SYRP
5.0000 mL | ORAL_SOLUTION | ORAL | Status: DC | PRN
  Administered 2017-01-06: 5 mL via ORAL
  Filled 2017-01-06: qty 10

## 2017-01-06 MED ORDER — MORPHINE SULFATE 15 MG PO TABS: 15.0000 mg | ORAL_TABLET | ORAL | 0 refills | Status: DC | PRN

## 2017-01-06 NOTE — Progress Notes (Signed)
CSW following to assist with discharge planning. PT recommending SNF, patient agreeable. CSW completed FL2 no bed offers received. CSW staffed case with Assistant [place] Director, CSW advised to contact ALF to see if they are able to meet patient's needs. CSW contacted [name], staff agreed to come assess patient.  CSW provided update to patient, patient reported that he wanted to see the ALF first. CSW explained to patient that there was not a way to take patient to the ALF to see ALF prior to admission. Patient reported that he is agreeable to meeting ALF staff.   ALF staff will come assess patient. CSW will continue to follow to assist with discharge planning.  Abundio Miu, [place] Social Worker [facility] Cell#: [phone]

## 2017-01-06 NOTE — NC FL2 (Signed)
[place] LEVEL OF CARE SCREENING TOOL     IDENTIFICATION  Patient Name: [name] Birthdate: 05/07/1960 Sex: male Admission Date (Current Location): 12/30/2016  [facility] and [place] Number:  Herbalist and Address:  [facility],  [place], [place]      Provider Number: [id number]  Attending Physician Name and Address:  Patrecia Pour, MD  Relative Name and Phone Number:       Current Level of Care: Hospital Recommended Level of Care: [place] Prior Approval Number:    Date Approved/Denied:   PASRR Number: [id number]  Discharge Plan: SNF    Current Diagnoses: Patient Active Problem List   Diagnosis Date Noted  . Severe malnutrition ([place]) 01/03/2017  . DOE (dyspnea on exertion) 12/30/2016  . Nausea   . Hypervolemia   . HCAP (healthcare-associated pneumonia) 12/19/2016  . SOB (shortness of breath) 12/19/2016  . Erosive gastropathy 12/18/2016  . Gastritis 12/18/2016  . Hematemesis 12/16/2016  . Intractable nausea and vomiting 12/16/2016  . HTN (hypertension) 10/30/2016  . Chronic obstructive pulmonary disease ([place]) 09/02/2016  . COPD (chronic obstructive pulmonary disease) ([place]) 08/19/2016  . Dehydration 06/04/2016  . Abdominal pain 06/04/2016  . 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 Adenocarcinoma of left lung, stage 4 ([place]) 05/02/2016  . Goals of care, counseling/discussion 05/02/2016  . Encounter for antineoplastic chemotherapy 05/02/2016  . Tobacco abuse 05/30/2011  . Alcohol abuse 05/30/2011  . Seizure ([place]) 05/28/2011    Orientation RESPIRATION BLADDER Height & Weight     Self, Time, Situation, Place  O2 Continent Weight: 117 lb (53.1 kg) Height:  5\' 11"  (180.3 cm)  BEHAVIORAL SYMPTOMS/MOOD NEUROLOGICAL BOWEL NUTRITION STATUS        Diet(heart healthy)  AMBULATORY STATUS COMMUNICATION OF NEEDS Skin   Limited Assist Verbally Normal                       Personal Care  Assistance Level of Assistance  Bathing, Feeding, Dressing Bathing Assistance: Limited assistance Feeding assistance: Independent Dressing Assistance: Limited assistance     Functional Limitations Info  Sight, Hearing, Speech Sight Info: Adequate Hearing Info: Adequate Speech Info: Adequate    SPECIAL CARE FACTORS FREQUENCY  PT (By licensed PT), OT (By licensed OT)     PT Frequency: 5x OT Frequency: 5x            Contractures Contractures Info: Not present    Additional Factors Info  Code Status, Allergies Code Status Info: DNR Allergies Info: NKA           Current Medications (01/06/2017):  This is the current hospital active medication list Current Facility-Administered Medications  Medication Dose Route Frequency Provider Last Rate Last Dose  . 0.9 %  sodium chloride infusion   Intravenous Continuous Robbie Lis, MD   Stopped at 01/05/17 1100  . acetaminophen (TYLENOL) tablet 500 mg  500 mg Oral BID PRN Elodia [name]., MD      . amLODipine [facility]) tablet 10 mg  10 mg Oral Daily Elodia [name]., MD   10 mg at 01/06/17 0848  . chlorhexidine (PERIDEX) 0.12 % solution 15 mL  15 mL Mouth Rinse BID Elodia [name]., MD   15 mL at 01/06/17 0848  . feeding supplement (ENSURE ENLIVE) (ENSURE ENLIVE) liquid 237 mL  237 mL Oral TID BM Adhikari Bk, Amrit, MD      . gabapentin (NEURONTIN) capsule 300  mg  300 mg Oral QHS Elodia [name]., MD   300 mg at 01/05/17 2134  . ipratropium-albuterol (DUONEB) 0.5-2.5 (3) MG/3ML nebulizer solution 3 mL  3 mL Nebulization Q2H PRN Robbie Lis, MD   3 mL at 01/03/17 2023  . loratadine (CLARITIN) tablet 10 mg  10 mg Oral Daily Elodia [name]., MD   10 mg at 01/06/17 0848  . MEDLINE mouth rinse  15 mL Mouth Rinse q12n4p Elodia [name]., MD   15 mL at 01/02/17 1359  . mometasone-formoterol (DULERA) 100-5 MCG/ACT inhaler 2 puff  2 puff Inhalation BID Marene Lenz, MD   2 puff at 01/06/17 0801   . morphine (MSIR) tablet 15 mg  15 mg Oral Q4H PRN Elodia [name]., MD   15 mg at 01/06/17 0847  . multivitamin with minerals tablet 1 tablet  1 tablet Oral Daily Marene Lenz, MD   1 tablet at 01/06/17 0847  . ondansetron (ZOFRAN-ODT) disintegrating tablet 4 mg  4 mg Oral Q8H PRN Elodia [name]., MD      . pantoprazole (PROTONIX) EC tablet 40 mg  40 mg Oral BID Elodia [name]., MD   40 mg at 01/06/17 0847  . polyethylene glycol (MIRALAX / GLYCOLAX) packet 17 g  17 g Oral Daily Marene Lenz, MD   17 g at 01/06/17 0851  . sodium chloride flush (NS) 0.9 % injection 10-40 mL  10-40 mL Intracatheter PRN Elodia [name]., MD   20 mL at 01/04/17 0304     Discharge Medications: Please see discharge summary for a list of discharge medications.  Relevant Imaging Results:  Relevant Lab Results:   Additional Information SSN   [id number]  [name], LCSW

## 2017-01-06 NOTE — Care Management Note (Signed)
Case Management Note  Patient Details  Name: [name] MRN: [id number] Date of Birth: 02-23-1960  Subjective/Objective:                    Action/Plan:  Pt now discharging to SNF.    Expected Discharge Date:  01/06/17               Expected Discharge Plan:  [place]  In-House Referral:  Clinical Social Work  Discharge [facility], CM Consult, Follow-up appt scheduled  Post Acute Care Choice:    Choice offered to:     DME Arranged:  [facility] DME Agency:  [place]:    [facility] Agency:     Status of Service:  Completed, signed off  If discussed at [place] of Stay Meetings, dates discussed:    Additional CommentsPurcell Mouton, RN 01/06/2017, 12:10 PM

## 2017-01-06 NOTE — Progress Notes (Signed)
Physical Therapy Treatment Patient Details Name: [name] MRN: [id number] DOB: Nov 03, 1960 Today's Date: 01/06/2017    History of Present Illness 56 yo male admitted with dyspnea on exertion. Hx of met lung cancer, Sz.     PT Comments    Pt on 2 lts at rest 94%.  Trial RA with amb (see below)  SATURATION QUALIFICATIONS: (This note is used to comply with regulatory documentation for home oxygen)  Patient Saturations on Room Air at Rest = 90%  Patient Saturations on Room Air while Ambulating 75 feet  = 80%  Patient Saturations on 3 Liters of oxygen while Ambulating = 90%  Please briefly explain why patient needs home oxygen:  Pt required supplemental oxygen to achieve therapeutic level.   Very limited activity tolerance with 3/4 dyspnea.  Pt is able to transfer at Supervision but requires assist with ambulation.  Very unsteady gait with noted decreased balance with increased distance.   If pt agrees, would benefit from [place] at SNF to regain prior safe level of mobility.     Follow Up Recommendations  SNF     Equipment Recommendations       Recommendations for Other Services       Precautions / Restrictions Precautions Precautions: None Precaution Comments: monitor O2 sats Restrictions Weight Bearing Restrictions: No    Mobility  Bed Mobility Overal bed mobility: Modified Independent                Transfers Overall transfer level: Needs assistance Equipment used: None Transfers: Sit to/from Stand;Stand Pivot Transfers Sit to Stand: Supervision Stand pivot transfers: Supervision       General transfer comment: pt uses furniture to steady himself   Ambulation/Gait Ambulation/Gait assistance: Min guard;Min assist Ambulation Distance (Feet): 45 Feet Assistive device: Rolling walker (2 wheeled) Gait Pattern/deviations: Step-to pattern;Step-through pattern;Trunk flexed Gait velocity: decreased   General Gait Details: very limited activity  tolerance and deconditioned.  Trial amb on RA sats decreased from 92 at rest to 80%.  Required 3 lts to achieve 90%.  DOE 3/4.    Stairs            Wheelchair Mobility    Modified Rankin (Stroke Patients Only)       Balance                                            Cognition Arousal/Alertness: Awake/alert Behavior During Therapy: WFL for tasks assessed/performed Overall Cognitive Status: Within Functional Limits for tasks assessed                                        Exercises      General Comments        Pertinent Vitals/Pain Pain Assessment: No/denies pain    Home Living                      Prior Function            PT Goals (current goals can now be found in the care plan section) Progress towards PT goals: Progressing toward goals    Frequency           PT Plan Current plan remains appropriate    Co-evaluation  AM-PAC PT "6 Clicks" Daily Activity  Outcome Measure  Difficulty turning over in bed (including adjusting bedclothes, sheets and blankets)?: None Difficulty moving from lying on back to sitting on the side of the bed? : None Difficulty sitting down on and standing up from a chair with arms (e.g., wheelchair, bedside commode, etc,.)?: None Help needed moving to and from a bed to chair (including a wheelchair)?: None Help needed walking in hospital room?: A Lot Help needed climbing 3-5 steps with a railing? : Total 6 Click Score: 19    End of Session Equipment Utilized During Treatment: Oxygen Activity Tolerance: Treatment limited secondary to medical complications (Comment)(lung CA) Patient left: in bed;with call bell/phone within reach Nurse Communication: Mobility status PT Visit Diagnosis: Other abnormalities of gait and mobility (R26.89)     Time: 1135-1150 PT Time Calculation (min) (ACUTE ONLY): 15 min  Charges:  $Gait Training: 8-22 mins                     G Codes:       Rica Koyanagi  PTA [facility]  Acute  Rehab Pager      [phone]
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
## 2017-01-06 NOTE — Progress Notes (Signed)
CSW informed by patient's MD that patient is now agreeable to SNF. CSW spoke with patient at bedside regarding interest in SNF. Patient reported that he is agreeable. CSW informed patient that he would need to stay at least 30 days and that he would need to sign his check over to SNF to pay for rehab, patient verbalized understanding. CSW will obtain PASRR, complete FL2 and provide bed offers to patient. CSW will continue to follow and assist with discharge planning.  [name], [place] Social Worker [facility] Cell#: [phone]

## 2017-01-07 ENCOUNTER — Inpatient Hospital Stay (HOSPITAL_COMMUNITY): Payer: Medicaid Other

## 2017-01-07 LAB — BODY FLUID CULTURE: Culture: NO GROWTH

## 2017-01-07 MED ORDER — HEPARIN SOD (PORK) LOCK FLUSH 100 UNIT/ML IV SOLN
500.0000 [IU] | INTRAVENOUS | Status: AC | PRN
  Administered 2017-01-07: 500 [IU]

## 2017-01-07 MED ORDER — SENNA 8.6 MG PO TABS
2.0000 | ORAL_TABLET | Freq: Every day | ORAL | Status: DC
  Administered 2017-01-07: 17.2 mg via ORAL
  Filled 2017-01-07: qty 2

## 2017-01-07 MED ORDER — TUBERCULIN PPD 5 UNIT/0.1ML ID SOLN
5.0000 [IU] | Freq: Once | INTRADERMAL | Status: DC
  Administered 2017-01-07: 5 [IU] via INTRADERMAL
  Filled 2017-01-07: qty 0.1

## 2017-01-07 NOTE — Progress Notes (Signed)
Patient assessed by [name] staff, staff reported that they are able to offer patient a bed. CSW sent patient's clinical information via the [place]. Staff reported that they are able to accept patient today and that they can provide transportation. Staff requested that patient's RN contact staff when patient is ready for discharge and that staff will pick patient up. Patient's RN provided with staff's contact information [facility] [phone]).  Patient's RN informed to call staff when patient is ready for transport, patient's RN verbalized understanding. Patient's O2 delivered to patient's room at hospital and additional O2 will be delivered to ALF.   CSW completed RSVP process for ALF to receive patient's PASRR. Referral ID [id number].   Patient discharging to [name].    Abundio Miu, [place] Social Worker [facility] Cell#: [phone]

## 2017-01-07 NOTE — Clinical Social Work Placement (Signed)
Patient received and accepted bed offer at [facility]. [facility] staff will transport patient to ALF, patient's RN will call to set up transportation when patient is ready to discharge. Patient's RN can call report to [phone], packet complete. CSW signing off, no other needs identified at this time.  CLINICAL SOCIAL WORK PLACEMENT  NOTE  Date:  01/07/2017  Patient Details  Name: [name] MRN: [id number] Date of Birth: 01/31/1960  Clinical Social Work is seeking post-discharge placement for this patient at the [place] level of care (*CSW will initial, date and re-position this form in  chart as items are completed):  Yes   Patient/family provided with [facility] Work Department's list of facilities offering this level of care within the geographic area requested by the patient (or if unable, by the patient's family).  Yes   Patient/family informed of their freedom to choose among providers that offer the needed level of care, that participate in Medicare, Medicaid or managed care program needed by the patient, have an available bed and are willing to accept the patient.  Yes   Patient/family informed of [facility]'s ownership interest in [facility] and [facility], as well as of the fact that they are under no obligation to receive care at these facilities.  PASRR submitted to EDS on 01/07/17     PASRR number received on       Existing PASRR number confirmed on       FL2 transmitted to all facilities in geographic area requested by pt/family on 01/06/17     FL2 transmitted to all facilities within larger geographic area on       Patient informed that his/her managed care company has contracts with or will negotiate with certain facilities, including the following:        Yes   Patient/family informed of bed offers received.  Patient chooses bed at [facility]     Physician recommends and patient  chooses bed at      Patient to be transferred to [facility] on 01/07/17.  Patient to be transferred to facility by [facility] Staff     Patient family notified on 01/07/17 of transfer.  Name of family member notified:  Patient aware     PHYSICIAN       Additional Comment:    _______________________________________________ [name], LCSW 01/07/2017, 4:38 PM

## 2017-01-07 NOTE — Progress Notes (Signed)
Patient seen and examined this morning. No events overnight, breathing is stable. No chest pain. Exam stable. In no distress, no wheezing, rales or rhonchi. No focal decreased breath sounds. CXR repeated and confirms no worsening effusion, infiltrate, or edema. Remains stable for discharge with caveat that discharge without oxygen is not a safe discharge. ALF to evaluate today, pt amenable.   Vance Gather, MD 01/07/2017 10:26 AM

## 2017-01-07 NOTE — NC FL2 (Signed)
[place] LEVEL OF CARE SCREENING TOOL     IDENTIFICATION  Patient Name: [name] Birthdate: Dec 15, 1960 Sex: male Admission Date (Current Location): 12/30/2016  [facility] [place] Number:  Herbalist and Address:  [facility],  [place], [place]      Provider Number: [id number]  Attending Physician Name and Address:  Patrecia Pour, MD  Relative Name and Phone Number:       Current Level of Care: Hospital Recommended Level of Care: [place] Prior Approval Number:    Date Approved/Denied:   PASRR Number:   Discharge Plan: ALF    Current Diagnoses: Patient Active Problem List   Diagnosis Date Noted  . Severe malnutrition ([place]) 01/03/2017  . DOE (dyspnea on exertion) 12/30/2016  . Nausea   . Hypervolemia   . HCAP (healthcare-associated pneumonia) 12/19/2016  . SOB (shortness of breath) 12/19/2016  . Erosive gastropathy 12/18/2016  . Gastritis 12/18/2016  . Hematemesis 12/16/2016  . Intractable nausea and vomiting 12/16/2016  . HTN (hypertension) 10/30/2016  . Chronic obstructive pulmonary disease ([place]) 09/02/2016  . COPD (chronic obstructive pulmonary disease) ([place]) 08/19/2016  . Dehydration 06/04/2016  . Abdominal pain 06/04/2016  . 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 Adenocarcinoma of left lung, stage 4 ([place]) 05/02/2016  . Goals of care, counseling/discussion 05/02/2016  . Encounter for antineoplastic chemotherapy 05/02/2016  . Tobacco abuse 05/30/2011  . Alcohol abuse 05/30/2011  . Seizure ([place]) 05/28/2011    Orientation RESPIRATION BLADDER Height & Weight     Self, Time, Situation, Place  O2 Continent Weight: 117 lb (53.1 kg) Height:  5\' 11"  (180.3 cm)  BEHAVIORAL SYMPTOMS/MOOD NEUROLOGICAL BOWEL NUTRITION STATUS        Diet (regular no added salt)  AMBULATORY STATUS COMMUNICATION OF NEEDS Skin   Limited Assist Verbally Normal                       Personal Care  Assistance Level of Assistance  Bathing, Feeding, Dressing Bathing Assistance: Limited assistance Feeding assistance: Independent Dressing Assistance: Limited assistance     Functional Limitations Info  Sight, Hearing, Speech Sight Info: Adequate Hearing Info: Adequate Speech Info: Adequate    SPECIAL CARE FACTORS FREQUENCY  PT (By licensed PT), OT (By licensed OT)  HHPT, HHOT, HHRN                Contractures Contractures Info: Not present    Additional Factors Info  Code Status, Allergies Code Status Info: DNR Allergies Info: NKA             Discharge Medications:  Medication List             STOP taking these medications            omeprazole 40 MG capsule Commonly known as:  PRILOSEC    ondansetron 4 MG tablet Commonly known as:  ZOFRAN                       TAKE these medications            acetaminophen 500 MG tablet Commonly known as:  TYLENOL Take 500 mg by mouth 2 (two) times daily as needed for mild pain or headache.    ALKA-SELTZER PLUS COLD PO Take 2 tablets by mouth at bedtime as needed (COUGH).    amLODipine 10 MG tablet Commonly known as:  NORVASC Take 1 tablet (10 mg  total) by mouth daily.    dexamethasone 4 MG tablet Commonly known as:  DECADRON 4 mg by mouth twice a day the day before, day of and day after the chemotherapy every 3 weeks    dextromethorphan-guaiFENesin 30-600 MG 12hr tablet Commonly known as:  MUCINEX DM Take 2 tablets by mouth 2 (two) times daily. 5 Days    Ipratropium-Albuterol 20-100 MCG/ACT Aers respimat Commonly known as:  COMBIVENT Inhale 1 puff into the lungs every 6 (six) hours. What changed:    when to take this  reasons to take this    lidocaine-prilocaine cream Commonly known as:  EMLA Apply 1 application topically as needed. What changed:  reasons to take this    loratadine 10 MG tablet Commonly known as:  CLARITIN Take 1 tablet (10 mg total) by mouth daily.     mometasone-formoterol 100-5 MCG/ACT Aero Commonly known as:  DULERA Inhale 2 puffs into the lungs 2 (two) times daily.    morphine 15 MG tablet Commonly known as:  MSIR Take 1 tablet (15 mg total) by mouth every 4 (four) hours as needed for severe pain.    ondansetron 4 MG disintegrating tablet Commonly known as:  ZOFRAN ODT Take 1 tablet (4 mg total) by mouth every 8 (eight) hours as needed for nausea or vomiting.    pantoprazole 40 MG tablet Commonly known as:  PROTONIX Take 1 tablet (40 mg total) by mouth 2 (two) times daily.        Relevant Imaging Results:  Relevant Lab Results:   Additional Information SSN   [id number]  [name], LCSW

## 2017-01-07 NOTE — Progress Notes (Signed)
A call was made to [facility]([phone]), spoke with [facility] concerning pt returning back with Oxygen. Maggie revealed that pt will not have a bed until Thursday due to [employer] not knowing where pt was.

## 2017-01-07 NOTE — Progress Notes (Signed)
Report called to Zacarias Pontes at [facility]. All questions answered. VSS.  Pt wheeled out via NT with packet with portable o2 tank and walker..  Picked up by facility.

## 2017-01-09 ENCOUNTER — Encounter: Payer: Self-pay | Admitting: Internal Medicine

## 2017-01-09 ENCOUNTER — Ambulatory Visit (HOSPITAL_BASED_OUTPATIENT_CLINIC_OR_DEPARTMENT_OTHER): Payer: Medicaid Other | Admitting: Internal Medicine

## 2017-01-09 ENCOUNTER — Telehealth: Payer: Self-pay | Admitting: Internal Medicine

## 2017-01-09 ENCOUNTER — Ambulatory Visit (HOSPITAL_BASED_OUTPATIENT_CLINIC_OR_DEPARTMENT_OTHER): Payer: Medicaid Other

## 2017-01-09 ENCOUNTER — Other Ambulatory Visit (HOSPITAL_BASED_OUTPATIENT_CLINIC_OR_DEPARTMENT_OTHER): Payer: Medicaid Other

## 2017-01-09 DIAGNOSIS — R5383 Other fatigue: Secondary | ICD-10-CM | POA: Diagnosis not present

## 2017-01-09 DIAGNOSIS — R1013 Epigastric pain: Secondary | ICD-10-CM

## 2017-01-09 DIAGNOSIS — R109 Unspecified abdominal pain: Secondary | ICD-10-CM

## 2017-01-09 DIAGNOSIS — Z5111 Encounter for antineoplastic chemotherapy: Secondary | ICD-10-CM

## 2017-01-09 DIAGNOSIS — C3492 Malignant neoplasm of unspecified part of left bronchus or lung: Secondary | ICD-10-CM

## 2017-01-09 DIAGNOSIS — K2970 Gastritis, unspecified, without bleeding: Secondary | ICD-10-CM | POA: Diagnosis not present

## 2017-01-09 DIAGNOSIS — C7951 Secondary malignant neoplasm of bone: Secondary | ICD-10-CM | POA: Diagnosis not present

## 2017-01-09 DIAGNOSIS — I10 Essential (primary) hypertension: Secondary | ICD-10-CM | POA: Diagnosis not present

## 2017-01-09 LAB — CBC WITH DIFFERENTIAL/PLATELET
BASO%: 0.2 % (ref 0.0–2.0)
Basophils Absolute: 0 10*3/uL (ref 0.0–0.1)
EOS ABS: 0 10*3/uL (ref 0.0–0.5)
EOS%: 0 % (ref 0.0–7.0)
HCT: 29.2 % — ABNORMAL LOW (ref 38.4–49.9)
HEMOGLOBIN: 9.7 g/dL — AB (ref 13.0–17.1)
LYMPH%: 3.1 % — ABNORMAL LOW (ref 14.0–49.0)
MCH: 34.9 pg — ABNORMAL HIGH (ref 27.2–33.4)
MCHC: 33.3 g/dL (ref 32.0–36.0)
MCV: 104.8 fL — AB (ref 79.3–98.0)
MONO#: 0.2 10*3/uL (ref 0.1–0.9)
MONO%: 4.3 % (ref 0.0–14.0)
NEUT%: 92.4 % — ABNORMAL HIGH (ref 39.0–75.0)
NEUTROS ABS: 4.3 10*3/uL (ref 1.5–6.5)
Platelets: 307 10*3/uL (ref 140–400)
RBC: 2.78 10*6/uL — ABNORMAL LOW (ref 4.20–5.82)
RDW: 18.9 % — AB (ref 11.0–14.6)
WBC: 4.7 10*3/uL (ref 4.0–10.3)
lymph#: 0.1 10*3/uL — ABNORMAL LOW (ref 0.9–3.3)

## 2017-01-09 LAB — COMPREHENSIVE METABOLIC PANEL
ALT: 18 U/L (ref 0–55)
ANION GAP: 15 meq/L — AB (ref 3–11)
AST: 33 U/L (ref 5–34)
Albumin: 3 g/dL — ABNORMAL LOW (ref 3.5–5.0)
Alkaline Phosphatase: 171 U/L — ABNORMAL HIGH (ref 40–150)
BUN: 25.7 mg/dL (ref 7.0–26.0)
CALCIUM: 10.1 mg/dL (ref 8.4–10.4)
CHLORIDE: 96 meq/L — AB (ref 98–109)
CO2: 25 mEq/L (ref 22–29)
CREATININE: 1.8 mg/dL — AB (ref 0.7–1.3)
EGFR: 46 mL/min/{1.73_m2} — ABNORMAL LOW (ref >60)
Glucose: 155 mg/dl — ABNORMAL HIGH (ref 70–140)
POTASSIUM: 4.7 meq/L (ref 3.5–5.1)
Sodium: 136 mEq/L (ref 136–145)
Total Bilirubin: 0.45 mg/dL (ref 0.20–1.20)
Total Protein: 8.9 g/dL — ABNORMAL HIGH (ref 6.4–8.3)

## 2017-01-09 MED ORDER — MORPHINE SULFATE 15 MG PO TABS: 15.0000 mg | ORAL_TABLET | ORAL | 0 refills | Status: DC | PRN

## 2017-01-09 MED ORDER — SODIUM CHLORIDE 0.9 % IV SOLN
1000.0000 mL | INTRAVENOUS | Status: DC
  Administered 2017-01-09: 1000 mL via INTRAVENOUS

## 2017-01-09 MED FILL — MORPHINE SULFATE IR 15 MG T: 15 | 10 days supply | Qty: 60 | Fill #0

## 2017-01-09 NOTE — Telephone Encounter (Signed)
Scheduled appt per 12/20 los - Gave patient AVS and calender per los.  
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
## 2017-01-09 NOTE — Patient Instructions (Signed)
Dehydration, Adult Dehydration is a condition in which there is not enough fluid or water in the body. This happens when you lose more fluids than you take in. Important organs, such as the kidneys, brain, and heart, cannot function without a proper amount of fluids. Any loss of fluids from the body can lead to dehydration. Dehydration can range from mild to severe. This condition should be treated right away to prevent it from becoming severe. What are the causes? This condition may be caused by:  Vomiting.  Diarrhea.  Excessive sweating, such as from heat exposure or exercise.  Not drinking enough fluid, especially: ? When ill. ? While doing activity that requires a lot of energy.  Excessive urination.  Fever.  Infection.  Certain medicines, such as medicines that cause the body to lose excess fluid (diuretics).  Inability to access safe drinking water.  Reduced physical ability to get adequate water and food.  What increases the risk? This condition is more likely to develop in people:  Who have a poorly controlled long-term (chronic) illness, such as diabetes, heart disease, or kidney disease.  Who are age 65 or older.  Who are disabled.  Who live in a place with high altitude.  Who play endurance sports.  What are the signs or symptoms? Symptoms of mild dehydration may include:  Thirst.  Dry lips.  Slightly dry mouth.  Dry, warm skin.  Dizziness. Symptoms of moderate dehydration may include:  Very dry mouth.  Muscle cramps.  Dark urine. Urine may be the color of tea.  Decreased urine production.  Decreased tear production.  Heartbeat that is irregular or faster than normal (palpitations).  Headache.  Light-headedness, especially when you stand up from a sitting position.  Fainting (syncope). Symptoms of severe dehydration may include:  Changes in skin, such as: ? Cold and clammy skin. ? Blotchy (mottled) or pale skin. ? Skin that does  not quickly return to normal after being lightly pinched and released (poor skin turgor).  Changes in body fluids, such as: ? Extreme thirst. ? No tear production. ? Inability to sweat when body temperature is high, such as in hot weather. ? Very little urine production.  Changes in vital signs, such as: ? Weak pulse. ? Pulse that is more than 100 beats a minute when sitting still. ? Rapid breathing. ? Low blood pressure.  Other changes, such as: ? Sunken eyes. ? Cold hands and feet. ? Confusion. ? Lack of energy (lethargy). ? Difficulty waking up from sleep. ? Short-term weight loss. ? Unconsciousness. How is this diagnosed? This condition is diagnosed based on your symptoms and a physical exam. Blood and urine tests may be done to help confirm the diagnosis. How is this treated? Treatment for this condition depends on the severity. Mild or moderate dehydration can often be treated at home. Treatment should be started right away. Do not wait until dehydration becomes severe. Severe dehydration is an emergency and it needs to be treated in a hospital. Treatment for mild dehydration may include:  Drinking more fluids.  Replacing salts and minerals in your blood (electrolytes) that you may have lost. Treatment for moderate dehydration may include:  Drinking an oral rehydration solution (ORS). This is a drink that helps you replace fluids and electrolytes (rehydrate). It can be found at pharmacies and retail stores. Treatment for severe dehydration may include:  Receiving fluids through an IV tube.  Receiving an electrolyte solution through a feeding tube that is passed through your nose   and into your stomach (nasogastric tube, or NG tube).  Correcting any abnormalities in electrolytes.  Treating the underlying cause of dehydration. Follow these instructions at home:  If directed by your health care provider, drink an ORS: ? Make an ORS by following instructions on the  package. ? Start by drinking small amounts, about  cup (120 mL) every 5-10 minutes. ? Slowly increase how much you drink until you have taken the amount recommended by your health care provider.  Drink enough clear fluid to keep your urine clear or pale yellow. If you were told to drink an ORS, finish the ORS first, then start slowly drinking other clear fluids. Drink fluids such as: ? Water. Do not drink only water. Doing that can lead to having too little salt (sodium) in the body (hyponatremia). ? Ice chips. ? Fruit juice that you have added water to (diluted fruit juice). ? Low-calorie sports drinks.  Avoid: ? Alcohol. ? Drinks that contain a lot of sugar. These include high-calorie sports drinks, fruit juice that is not diluted, and soda. ? Caffeine. ? Foods that are greasy or contain a lot of fat or sugar.  Take over-the-counter and prescription medicines only as told by your health care provider.  Do not take sodium tablets. This can lead to having too much sodium in the body (hypernatremia).  Eat foods that contain a healthy balance of electrolytes, such as bananas, oranges, potatoes, tomatoes, and spinach.  Keep all follow-up visits as told by your health care provider. This is important. Contact a health care provider if:  You have abdominal pain that: ? Gets worse. ? Stays in one area (localizes).  You have a rash.  You have a stiff neck.  You are more irritable than usual.  You are sleepier or more difficult to wake up than usual.  You feel weak or dizzy.  You feel very thirsty.  You have urinated only a small amount of very dark urine over 6-8 hours. Get help right away if:  You have symptoms of severe dehydration.  You cannot drink fluids without vomiting.  Your symptoms get worse with treatment.  You have a fever.  You have a severe headache.  You have vomiting or diarrhea that: ? Gets worse. ? Does not go away.  You have blood or green matter  (bile) in your vomit.  You have blood in your stool. This may cause stool to look black and tarry.  You have not urinated in 6-8 hours.  You faint.  Your heart rate while sitting still is over 100 beats a minute.  You have trouble breathing. This information is not intended to replace advice given to you by your health care provider. Make sure you discuss any questions you have with your health care provider. Document Released: 01/07/2005 Document Revised: 08/04/2015 Document Reviewed: 03/03/2015 Elsevier Interactive Patient Education  2018 [employer]  

## 2017-01-09 NOTE — Patient Instructions (Signed)
Steps to Quit Smoking Smoking tobacco can be bad for your health. It can also affect almost every organ in your body. Smoking puts you and people around you at risk for many serious long-lasting (chronic) diseases. Quitting smoking is hard, but it is one of the best things that you can do for your health. It is never too late to quit. What are the benefits of quitting smoking? When you quit smoking, you lower your risk for getting serious diseases and conditions. They can include:  Lung cancer or lung disease.  Heart disease.  Stroke.  Heart attack.  Not being able to have children (infertility).  Weak bones (osteoporosis) and broken bones (fractures).  If you have coughing, wheezing, and shortness of breath, those symptoms may get better when you quit. You may also get sick less often. If you are pregnant, quitting smoking can help to lower your chances of having a baby of low birth weight. What can I do to help me quit smoking? Talk with your doctor about what can help you quit smoking. Some things you can do (strategies) include:  Quitting smoking totally, instead of slowly cutting back how much you smoke over a period of time.  Going to in-person counseling. You are more likely to quit if you go to many counseling sessions.  Using resources and support systems, such as: ? Online chats with a counselor. ? Phone quitlines. ? Printed self-help materials. ? Support groups or group counseling. ? Text messaging programs. ? Mobile phone apps or applications.  Taking medicines. Some of these medicines may have nicotine in them. If you are pregnant or breastfeeding, do not take any medicines to quit smoking unless your doctor says it is okay. Talk with your doctor about counseling or other things that can help you.  Talk with your doctor about using more than one strategy at the same time, such as taking medicines while you are also going to in-person counseling. This can help make  quitting easier. What things can I do to make it easier to quit? Quitting smoking might feel very hard at first, but there is a lot that you can do to make it easier. Take these steps:  Talk to your family and friends. Ask them to support and encourage you.  Call phone quitlines, reach out to support groups, or work with a counselor.  Ask people who smoke to not smoke around you.  Avoid places that make you want (trigger) to smoke, such as: ? Bars. ? Parties. ? Smoke-break areas at work.  Spend time with people who do not smoke.  Lower the stress in your life. Stress can make you want to smoke. Try these things to help your stress: ? Getting regular exercise. ? Deep-breathing exercises. ? Yoga. ? Meditating. ? Doing a body scan. To do this, close your eyes, focus on one area of your body at a time from head to toe, and notice which parts of your body are tense. Try to relax the muscles in those areas.  Download or buy apps on your mobile phone or tablet that can help you stick to your quit plan. There are many free apps, such as QuitGuide from the CDC (Centers for Disease Control and Prevention). You can find more support from smokefree.gov and other websites.  This information is not intended to replace advice given to you by your health care provider. Make sure you discuss any questions you have with your health care provider. Document Released: 11/03/2008 Document   Revised: 09/05/2015 Document Reviewed: 05/24/2014 Elsevier Interactive Patient Education  2018 [employer] Inc.  

## 2017-01-09 NOTE — Progress Notes (Signed)
Reviewed CBC and CMET with [name]. No treatment today. VO for 1 Liter of NS. Delay treatment x 1 week.

## 2017-01-10 ENCOUNTER — Ambulatory Visit: Payer: Medicaid Other | Admitting: Family Medicine

## 2017-01-10 ENCOUNTER — Telehealth: Payer: Self-pay | Admitting: Internal Medicine

## 2017-01-10 NOTE — Telephone Encounter (Signed)
Rescheduled appt per 12/21 sch msg - spoke with patient regarding appts.

## 2017-01-16 ENCOUNTER — Other Ambulatory Visit: Payer: Medicaid Other

## 2017-01-17 ENCOUNTER — Other Ambulatory Visit (HOSPITAL_BASED_OUTPATIENT_CLINIC_OR_DEPARTMENT_OTHER): Payer: Medicaid Other

## 2017-01-17 ENCOUNTER — Ambulatory Visit (HOSPITAL_BASED_OUTPATIENT_CLINIC_OR_DEPARTMENT_OTHER): Payer: Medicaid Other

## 2017-01-17 VITALS — BP 152/86 | HR 80 | Temp 97.8°F | Resp 18 | Wt 131.2 lb

## 2017-01-17 DIAGNOSIS — Z5111 Encounter for antineoplastic chemotherapy: Secondary | ICD-10-CM

## 2017-01-17 DIAGNOSIS — C7951 Secondary malignant neoplasm of bone: Secondary | ICD-10-CM

## 2017-01-17 DIAGNOSIS — C3492 Malignant neoplasm of unspecified part of left bronchus or lung: Secondary | ICD-10-CM | POA: Diagnosis present

## 2017-01-17 LAB — CBC WITH DIFFERENTIAL/PLATELET
BASO%: 0.4 % (ref 0.0–2.0)
Basophils Absolute: 0.1 10*3/uL (ref 0.0–0.1)
EOS%: 0.7 % (ref 0.0–7.0)
Eosinophils Absolute: 0.1 10*3/uL (ref 0.0–0.5)
HEMATOCRIT: 29.7 % — AB (ref 38.4–49.9)
HGB: 9.7 g/dL — ABNORMAL LOW (ref 13.0–17.1)
LYMPH%: 12.5 % — AB (ref 14.0–49.0)
MCH: 34.8 pg — ABNORMAL HIGH (ref 27.2–33.4)
MCHC: 32.7 g/dL (ref 32.0–36.0)
MCV: 106.5 fL — ABNORMAL HIGH (ref 79.3–98.0)
MONO#: 1 10*3/uL — AB (ref 0.1–0.9)
MONO%: 6.4 % (ref 0.0–14.0)
NEUT%: 80 % — ABNORMAL HIGH (ref 39.0–75.0)
NEUTROS ABS: 12.3 10*3/uL — AB (ref 1.5–6.5)
Platelets: 344 10*3/uL (ref 140–400)
RBC: 2.79 10*6/uL — AB (ref 4.20–5.82)
RDW: 19.6 % — AB (ref 11.0–14.6)
WBC: 15.3 10*3/uL — AB (ref 4.0–10.3)
lymph#: 1.9 10*3/uL (ref 0.9–3.3)
nRBC: 0 % (ref 0–0)

## 2017-01-17 LAB — COMPREHENSIVE METABOLIC PANEL
ALBUMIN: 3.1 g/dL — AB (ref 3.5–5.0)
ALK PHOS: 154 U/L — AB (ref 40–150)
ALT: 15 U/L (ref 0–55)
ANION GAP: 9 meq/L (ref 3–11)
AST: 27 U/L (ref 5–34)
BUN: 14.7 mg/dL (ref 7.0–26.0)
CALCIUM: 9.5 mg/dL (ref 8.4–10.4)
CO2: 26 mEq/L (ref 22–29)
Chloride: 107 mEq/L (ref 98–109)
Creatinine: 1.4 mg/dL — ABNORMAL HIGH (ref 0.7–1.3)
EGFR: >60 mL/min/{1.73_m2} (ref >60)
Glucose: 120 mg/dl (ref 70–140)
POTASSIUM: 3.5 meq/L (ref 3.5–5.1)
Sodium: 142 mEq/L (ref 136–145)
Total Bilirubin: 0.24 mg/dL (ref 0.20–1.20)
Total Protein: 7.6 g/dL (ref 6.4–8.3)

## 2017-01-17 LAB — TECHNOLOGIST REVIEW

## 2017-01-17 MED ORDER — SODIUM CHLORIDE 0.9 % IV SOLN
510.0000 mg/m2 | Freq: Once | INTRAVENOUS | Status: AC
  Administered 2017-01-17: 900 mg via INTRAVENOUS
  Filled 2017-01-17: qty 20

## 2017-01-17 MED ORDER — PROCHLORPERAZINE MALEATE 10 MG PO TABS
ORAL_TABLET | ORAL | Status: AC
  Filled 2017-01-17: qty 1

## 2017-01-17 MED ORDER — HEPARIN SOD (PORK) LOCK FLUSH 100 UNIT/ML IV SOLN
500.0000 [IU] | Freq: Once | INTRAVENOUS | Status: AC | PRN
Start: 2017-01-17 — End: 2017-01-17
  Administered 2017-01-17: 500 [IU]
  Filled 2017-01-17: qty 5

## 2017-01-17 MED ORDER — SODIUM CHLORIDE 0.9% FLUSH
10.0000 mL | INTRAVENOUS | Status: DC | PRN
  Administered 2017-01-17: 10 mL
  Filled 2017-01-17: qty 10

## 2017-01-17 MED ORDER — CYANOCOBALAMIN 1000 MCG/ML IJ SOLN
INTRAMUSCULAR | Status: AC
  Filled 2017-01-17: qty 1

## 2017-01-17 MED ORDER — SODIUM CHLORIDE 0.9 % IV SOLN
Freq: Once | INTRAVENOUS | Status: AC
  Administered 2017-01-17: 12:00:00 via INTRAVENOUS

## 2017-01-17 MED ORDER — CYANOCOBALAMIN 1000 MCG/ML IJ SOLN
1000.0000 ug | Freq: Once | INTRAMUSCULAR | Status: AC
  Administered 2017-01-17: 1000 ug via INTRAMUSCULAR

## 2017-01-17 MED ORDER — PROCHLORPERAZINE MALEATE 10 MG PO TABS
10.0000 mg | ORAL_TABLET | Freq: Once | ORAL | Status: AC
  Administered 2017-01-17: 10 mg via ORAL

## 2017-01-17 NOTE — Progress Notes (Signed)
Pt educated to be taking 1 mg folic acid PO daily, and per [facility] RN prescription was sent into patient's pharmacy. Note sent with pt to nursing facility. Pt verbalizes understanding.

## 2017-01-17 NOTE — Patient Instructions (Signed)
[facility] Discharge Instructions for Patients Receiving Chemotherapy  Today you received the following chemotherapy agents: Alimta   To help prevent nausea and vomiting after your treatment, we encourage you to take your nausea medication as directed.    If you develop nausea and vomiting that is not controlled by your nausea medication, call the clinic.   BELOW ARE SYMPTOMS THAT SHOULD BE REPORTED IMMEDIATELY:  *FEVER GREATER THAN 100.5 F  *CHILLS WITH OR WITHOUT FEVER  NAUSEA AND VOMITING THAT IS NOT CONTROLLED WITH YOUR NAUSEA MEDICATION  *UNUSUAL SHORTNESS OF BREATH  *UNUSUAL BRUISING OR BLEEDING  TENDERNESS IN MOUTH AND THROAT WITH OR WITHOUT PRESENCE OF ULCERS  *URINARY PROBLEMS  *BOWEL PROBLEMS  UNUSUAL RASH Items with * indicate a potential emergency and should be followed up as soon as possible.  Feel free to call the clinic should you have any questions or concerns. The clinic phone number is [phone].  Please show the [facility] at check-in to the Emergency Department and triage nurse.

## 2017-01-23 ENCOUNTER — Encounter (HOSPITAL_BASED_OUTPATIENT_CLINIC_OR_DEPARTMENT_OTHER): Payer: Medicaid Other | Admitting: Medical

## 2017-01-23 ENCOUNTER — Other Ambulatory Visit (HOSPITAL_BASED_OUTPATIENT_CLINIC_OR_DEPARTMENT_OTHER): Payer: Medicaid Other

## 2017-01-23 ENCOUNTER — Telehealth: Payer: Self-pay | Admitting: *Deleted

## 2017-01-23 DIAGNOSIS — C3492 Malignant neoplasm of unspecified part of left bronchus or lung: Secondary | ICD-10-CM

## 2017-01-23 LAB — CBC WITH DIFFERENTIAL/PLATELET
BASO%: 0.2 % (ref 0.0–2.0)
BASOS ABS: 0 10*3/uL (ref 0.0–0.1)
EOS%: 0.4 % (ref 0.0–7.0)
Eosinophils Absolute: 0 10*3/uL (ref 0.0–0.5)
HEMATOCRIT: 27.5 % — AB (ref 38.4–49.9)
HGB: 9.3 g/dL — ABNORMAL LOW (ref 13.0–17.1)
LYMPH#: 0.1 10*3/uL — AB (ref 0.9–3.3)
LYMPH%: 1.2 % — ABNORMAL LOW (ref 14.0–49.0)
MCH: 35.8 pg — AB (ref 27.2–33.4)
MCHC: 33.8 g/dL (ref 32.0–36.0)
MCV: 106 fL — ABNORMAL HIGH (ref 79.3–98.0)
MONO#: 0.1 10*3/uL (ref 0.1–0.9)
MONO%: 1 % (ref 0.0–14.0)
NEUT#: 9.5 10*3/uL — ABNORMAL HIGH (ref 1.5–6.5)
NEUT%: 97.2 % — AB (ref 39.0–75.0)
Platelets: 170 10*3/uL (ref 140–400)
RBC: 2.59 10*6/uL — ABNORMAL LOW (ref 4.20–5.82)
RDW: 19.2 % — ABNORMAL HIGH (ref 11.0–14.6)
WBC: 9.8 10*3/uL (ref 4.0–10.3)

## 2017-01-23 LAB — COMPREHENSIVE METABOLIC PANEL
ALT: 17 U/L (ref 0–55)
AST: 21 U/L (ref 5–34)
Albumin: 3 g/dL — ABNORMAL LOW (ref 3.5–5.0)
Alkaline Phosphatase: 129 U/L (ref 40–150)
Anion Gap: 9 mEq/L (ref 3–11)
BUN: 23 mg/dL (ref 7.0–26.0)
CALCIUM: 9.6 mg/dL (ref 8.4–10.4)
CHLORIDE: 100 meq/L (ref 98–109)
CO2: 26 mEq/L (ref 22–29)
CREATININE: 1.6 mg/dL — AB (ref 0.7–1.3)
EGFR: 53 mL/min/{1.73_m2} — AB (ref >60)
Glucose: 102 mg/dl (ref 70–140)
Potassium: 4.6 mEq/L (ref 3.5–5.1)
Sodium: 135 mEq/L — ABNORMAL LOW (ref 136–145)
Total Bilirubin: 0.98 mg/dL (ref 0.20–1.20)
Total Protein: 7.9 g/dL (ref 6.4–8.3)

## 2017-01-23 NOTE — Telephone Encounter (Signed)
Received walk-in form from registration. Pt here for labs but he is requesting to be seen for left lateral chest wall pain, nose bleed and nausea.  Spoke with pt in lobby and then sent high priority [facility] visit request to scheduler. Advised pt that we would see him as soon as room available Called back back to room at approx. 12:00 pm (when room was available) Pt was no where to be found. NT checked lobby numerous times throughout the day and he was not there.  Attempted phone to pt but there was no answer.

## 2017-01-24 NOTE — Progress Notes (Signed)
This encounter was created in error - please disregard.

## 2017-01-30 ENCOUNTER — Encounter: Payer: Self-pay | Admitting: Nutrition

## 2017-01-30 ENCOUNTER — Ambulatory Visit: Payer: Medicaid Other | Admitting: Oncology

## 2017-01-30 ENCOUNTER — Encounter: Payer: Medicaid Other | Admitting: Nutrition

## 2017-01-30 ENCOUNTER — Other Ambulatory Visit: Payer: Medicaid Other

## 2017-01-30 ENCOUNTER — Ambulatory Visit: Payer: Medicaid Other

## 2017-01-30 NOTE — Progress Notes (Signed)
Patient did not show up for nutrition follow-up. 

## 2017-01-31 ENCOUNTER — Telehealth: Payer: Self-pay | Admitting: Oncology

## 2017-01-31 NOTE — Telephone Encounter (Signed)
Spoke to patient regarding upcoming January appointments per 1/9 sch message.

## 2017-02-07 ENCOUNTER — Encounter: Payer: Self-pay | Admitting: Oncology

## 2017-02-07 ENCOUNTER — Other Ambulatory Visit: Payer: Self-pay

## 2017-02-07 ENCOUNTER — Other Ambulatory Visit: Payer: Self-pay | Admitting: Medical Oncology

## 2017-02-07 ENCOUNTER — Inpatient Hospital Stay (HOSPITAL_BASED_OUTPATIENT_CLINIC_OR_DEPARTMENT_OTHER): Payer: Medicaid Other | Admitting: Oncology

## 2017-02-07 ENCOUNTER — Inpatient Hospital Stay: Payer: Medicaid Other | Attending: Internal Medicine

## 2017-02-07 ENCOUNTER — Inpatient Hospital Stay: Payer: Medicaid Other

## 2017-02-07 ENCOUNTER — Other Ambulatory Visit: Payer: Self-pay | Admitting: Hematology

## 2017-02-07 VITALS — BP 171/86 | HR 102 | Temp 98.2°F | Resp 17 | Ht 71.0 in | Wt 128.9 lb

## 2017-02-07 DIAGNOSIS — N289 Disorder of kidney and ureter, unspecified: Secondary | ICD-10-CM | POA: Insufficient documentation

## 2017-02-07 DIAGNOSIS — G893 Neoplasm related pain (acute) (chronic): Secondary | ICD-10-CM | POA: Diagnosis not present

## 2017-02-07 DIAGNOSIS — D6481 Anemia due to antineoplastic chemotherapy: Secondary | ICD-10-CM

## 2017-02-07 DIAGNOSIS — C3492 Malignant neoplasm of unspecified part of left bronchus or lung: Secondary | ICD-10-CM

## 2017-02-07 DIAGNOSIS — I10 Essential (primary) hypertension: Secondary | ICD-10-CM | POA: Insufficient documentation

## 2017-02-07 DIAGNOSIS — D649 Anemia, unspecified: Secondary | ICD-10-CM

## 2017-02-07 DIAGNOSIS — Z9221 Personal history of antineoplastic chemotherapy: Secondary | ICD-10-CM | POA: Insufficient documentation

## 2017-02-07 DIAGNOSIS — Z5111 Encounter for antineoplastic chemotherapy: Secondary | ICD-10-CM

## 2017-02-07 DIAGNOSIS — D539 Nutritional anemia, unspecified: Secondary | ICD-10-CM | POA: Insufficient documentation

## 2017-02-07 LAB — COMPREHENSIVE METABOLIC PANEL
ALBUMIN: 3 g/dL — AB (ref 3.5–5.0)
ALK PHOS: 160 U/L — AB (ref 40–150)
ALT: 28 U/L (ref 0–55)
ANION GAP: 10 (ref 3–11)
AST: 33 U/L (ref 5–34)
BILIRUBIN TOTAL: 0.4 mg/dL (ref 0.2–1.2)
BUN: 14 mg/dL (ref 7–26)
CALCIUM: 9.7 mg/dL (ref 8.4–10.4)
CO2: 27 mmol/L (ref 22–29)
Chloride: 102 mmol/L (ref 98–109)
Creatinine, Ser: 1.7 mg/dL — ABNORMAL HIGH (ref 0.70–1.30)
GFR calc Af Amer: 50 mL/min — ABNORMAL LOW (ref >60)
GFR, EST NON AFRICAN AMERICAN: 43 mL/min — AB (ref >60)
GLUCOSE: 130 mg/dL (ref 70–140)
Potassium: 4.2 mmol/L (ref 3.5–5.1)
Sodium: 139 mmol/L (ref 136–145)
TOTAL PROTEIN: 7.9 g/dL (ref 6.4–8.3)

## 2017-02-07 LAB — CBC WITH DIFFERENTIAL/PLATELET
BASOS PCT: 0 %
Basophils Absolute: 0 10*3/uL (ref 0.0–0.1)
Eosinophils Absolute: 0 10*3/uL (ref 0.0–0.5)
Eosinophils Relative: 0 %
HEMATOCRIT: 23.8 % — AB (ref 38.4–49.9)
HEMOGLOBIN: 8 g/dL — AB (ref 13.0–17.1)
LYMPHS ABS: 0.1 10*3/uL — AB (ref 0.9–3.3)
LYMPHS PCT: 2 %
MCH: 35.2 pg — ABNORMAL HIGH (ref 27.2–33.4)
MCHC: 33.5 g/dL (ref 32.0–36.0)
MCV: 105.1 fL — AB (ref 79.3–98.0)
MONO ABS: 0.6 10*3/uL (ref 0.1–0.9)
MONOS PCT: 11 %
NEUTROS ABS: 4.5 10*3/uL (ref 1.5–6.5)
NEUTROS PCT: 87 %
Platelets: 369 10*3/uL (ref 140–400)
RBC: 2.26 MIL/uL — ABNORMAL LOW (ref 4.20–5.82)
RDW: 18.2 % — ABNORMAL HIGH (ref 11.0–15.6)
WBC: 5.2 10*3/uL (ref 4.0–10.3)

## 2017-02-07 LAB — PREPARE RBC (CROSSMATCH)

## 2017-02-07 LAB — ABO/RH: ABO/RH(D): O POS

## 2017-02-07 MED ORDER — ACETAMINOPHEN 325 MG PO TABS
ORAL_TABLET | ORAL | Status: AC
Start: 2017-02-07 — End: 2017-02-07
  Filled 2017-02-07: qty 2

## 2017-02-07 MED ORDER — DIPHENHYDRAMINE HCL 25 MG PO CAPS
ORAL_CAPSULE | ORAL | Status: AC
  Filled 2017-02-07: qty 1

## 2017-02-07 MED ORDER — ACETAMINOPHEN 325 MG PO TABS
650.0000 mg | ORAL_TABLET | Freq: Once | ORAL | Status: AC
  Administered 2017-02-07: 650 mg via ORAL

## 2017-02-07 MED ORDER — HEPARIN SOD (PORK) LOCK FLUSH 100 UNIT/ML IV SOLN
500.0000 [IU] | Freq: Every day | INTRAVENOUS | Status: AC | PRN
  Administered 2017-02-07: 500 [IU]
  Filled 2017-02-07: qty 5

## 2017-02-07 MED ORDER — SODIUM CHLORIDE 0.9% FLUSH
10.0000 mL | INTRAVENOUS | Status: AC | PRN
  Administered 2017-02-07: 10 mL
  Filled 2017-02-07: qty 10

## 2017-02-07 MED ORDER — SODIUM CHLORIDE 0.9 % IV SOLN
250.0000 mL | Freq: Once | INTRAVENOUS | Status: AC
  Administered 2017-02-07: 250 mL via INTRAVENOUS

## 2017-02-07 MED ORDER — DIPHENHYDRAMINE HCL 25 MG PO CAPS
25.0000 mg | ORAL_CAPSULE | Freq: Once | ORAL | Status: AC
  Administered 2017-02-07: 25 mg via ORAL

## 2017-02-07 NOTE — Patient Instructions (Signed)

## 2017-02-07 NOTE — Progress Notes (Signed)
No chemotherapy today due to kidney function. Pt to receive 2 units of blood.

## 2017-02-07 NOTE — Assessment & Plan Note (Signed)
This is a very pleasant 57 year old African-American male with a stage IV non-small cell lung cancer, adenocarcinoma with no actionable mutations. He underwent systemic chemotherapy with carboplatin, Alimta and Avastin status post 6 cycles and has been tolerating the treatment fairly well. He is currently undergoing maintenance treatment with Alimta 500 MG/M2 every 3 weeks, status post 5 cycles. Labs reviewed with Dr. Irene Limbo.  We will delay the start of his chemotherapy by 1 week due to renal insufficiency.  GFR is 50.  The patient is anemic with a hemoglobin 8.0.  He does not report any active bleeding.  He is symptomatic with dyspnea on exertion.  The patient will receive 2 units of packed red blood cells today.  For pain management, the patient will continue morphine as needed.  For hypertension, the patient will continue with his current blood pressure medication.  The patient will return next week for labs and possible chemotherapy if his renal function improves.  He will have a follow-up visit in approximately 4 weeks for evaluation prior to cycle 7 of his chemotherapy. The patient was advised to call immediately if he has any concerning symptoms in the interval. The patient voices understanding of current disease status and treatment options and is in agreement with the current care plan. All questions were answered. The patient knows to call the clinic with any problems, questions or concerns. We can certainly see the patient much sooner if necessary.

## 2017-02-07 NOTE — Progress Notes (Signed)
[name] OFFICE PROGRESS NOTE  Patient, No Pcp Per No address on file  DIAGNOSIS: Stage IV (T3, N3, M1c) non-small cell lung cancer, moderately to poorly differentiated adenocarcinoma diagnosed in February 2018 with negative EGFR, ALK, ROS1and BRAF mutations, with PDL1 expression 15-20 %.  PRIOR THERAPY: 1) Palliative radiotherapy to the T12 and L3 completed on 05/27/2016 under the care of Dr. Tammi Klippel. 2) Systemic chemotherapy with carboplatin for AUC of 5, Alimta 500 MG/M2 and Avastin 15 MG/KG every 3 weeks. Status post 6 cycles with partial response.  CURRENT THERAPY: Maintenance treatment with single agent Alimta 500 MG/M2 every 3 weeks. First dose 10/09/2016.status post 5 cycles.  INTERVAL HISTORY: [name] 57 y.o. male returns for teen follow-up visit by himself.  The patient is feeling fine today has no specific complaints except for dyspnea on exertion.  He has ongoing right chest pain and uses morphine as needed.  His pain is not worsened.  He does not use morphine every day.  Patient denies fevers and chills.  Denies shortness of breath at rest, cough, hemoptysis.  Denies bleeding.  Denies nausea, vomiting, constipation, diarrhea.  Patient is here for evaluation prior to cycle 6 of his chemotherapy.  MEDICAL HISTORY: Past Medical History:  Diagnosis Date  . Abdominal pain 06/04/2016  . Adenocarcinoma of left lung, stage 4 ([place]) 05/02/2016  . Bronchitis due to tobacco use ([place])   . Cancer ([place])    Lung  . Dehydration 06/04/2016  . Encounter for antineoplastic chemotherapy 05/02/2016  . Goals of care, counseling/discussion 05/02/2016  . HTN (hypertension) 10/30/2016  . Recurrent upper respiratory infection (URI)   . Seizures ([place]) 05/2011   new onset  . Shortness of breath     ALLERGIES:  has No Known Allergies.  MEDICATIONS:  Current Outpatient Medications  Medication Sig Dispense Refill  . acetaminophen (TYLENOL) 500 MG tablet Take 500 mg by mouth  2 (two) times daily as needed for mild pain or headache.    Marland Kitchen amLODipine (NORVASC) 10 MG tablet Take 1 tablet (10 mg total) by mouth daily. 30 tablet 2  . dexamethasone (DECADRON) 4 MG tablet 4 mg by mouth twice a day the day before, day of and day after the chemotherapy every 3 weeks 40 tablet 1  . Ipratropium-Albuterol (COMBIVENT) 20-100 MCG/ACT AERS respimat Inhale 1 puff into the lungs every 6 (six) hours. (Patient taking differently: Inhale 1 puff into the lungs every 6 (six) hours as needed for wheezing or shortness of breath. ) 1 Inhaler 0  . loratadine (CLARITIN) 10 MG tablet Take 1 tablet (10 mg total) by mouth daily. 20 tablet 0  . mometasone-formoterol (DULERA) 100-5 MCG/ACT AERO Inhale 2 puffs into the lungs 2 (two) times daily. 13 g 0  . morphine (MSIR) 15 MG tablet Take 1 tablet (15 mg total) by mouth every 4 (four) hours as needed for severe pain. 60 tablet 0  . pantoprazole (PROTONIX) 40 MG tablet Take 1 tablet (40 mg total) by mouth 2 (two) times daily. 60 tablet 1  . Chlorphen-Phenyleph-ASA (ALKA-SELTZER PLUS COLD PO) Take 2 tablets by mouth at bedtime as needed (COUGH).    Marland Kitchen dextromethorphan-guaiFENesin (MUCINEX DM) 30-600 MG 12hr tablet Take 2 tablets by mouth 2 (two) times daily. 5 Days    . lidocaine-prilocaine (EMLA) cream Apply 1 application topically as needed. (Patient not taking: Reported on 02/07/2017) 30 g 0  . ondansetron (ZOFRAN ODT) 4 MG disintegrating tablet Take 1 tablet (4 mg total) by mouth  every 8 (eight) hours as needed for nausea or vomiting. (Patient not taking: Reported on 02/07/2017) 20 tablet 0   No current facility-administered medications for this visit.    Facility-Administered Medications Ordered in Other Visits  Medication Dose Route Frequency Provider Last Rate Last Dose  . heparin lock flush 100 unit/mL  500 Units Intracatheter Daily PRN Curcio, Kristin R, NP      . sodium chloride flush (NS) 0.9 % injection 10 mL  10 mL Intracatheter PRN Curcio,  Roselie Awkward, NP        SURGICAL HISTORY:  Past Surgical History:  Procedure Laterality Date  . ESOPHAGOGASTRODUODENOSCOPY (EGD) WITH PROPOFOL N/A 12/17/2016   Procedure: ESOPHAGOGASTRODUODENOSCOPY (EGD) WITH PROPOFOL;  Surgeon: Ladene Artist, MD;  Location: [facility];  Service: Endoscopy;  Laterality: N/A;  . IR FLUORO GUIDE PORT INSERTION RIGHT  05/13/2016  . IR US GUIDE VASC ACCESS RIGHT  05/13/2016  . NO PAST SURGERIES      REVIEW OF SYSTEMS:   Review of Systems  Constitutional: Negative for appetite change, chills, fatigue, fever and unexpected weight change.  HENT:   Negative for mouth sores, nosebleeds, sore throat and trouble swallowing.   Eyes: Negative for eye problems and icterus.  Respiratory: Negative for cough, hemoptysis, shortness of breath at rest and wheezing.  Positive for dyspnea on exertion. Cardiovascular: Negative for leg swelling. Positive for ongoing right-sided chest pain controlled with morphine as needed. Gastrointestinal: Negative for abdominal pain, constipation, diarrhea, nausea and vomiting.  Genitourinary: Negative for bladder incontinence, difficulty urinating, dysuria, frequency and hematuria.   Musculoskeletal: Negative for back pain, gait problem, neck pain and neck stiffness.  Skin: Negative for itching and rash.  Neurological: Negative for dizziness, extremity weakness, gait problem, headaches, light-headedness and seizures.  Hematological: Negative for adenopathy. Does not bruise/bleed easily.  Psychiatric/Behavioral: Negative for confusion, depression and sleep disturbance. The patient is not nervous/anxious.     PHYSICAL EXAMINATION:  Blood pressure (!) 171/86, pulse (!) 102, temperature 98.2 F (36.8 C), resp. rate 17, height 5' 11"  (1.803 m), weight 128 lb 14.4 oz (58.5 kg), SpO2 94 %.  ECOG PERFORMANCE STATUS: 1 - Symptomatic but completely ambulatory  Physical Exam  Constitutional: Oriented to person, place, and time and  well-developed, well-nourished, and in no distress. No distress.  HENT:  Head: Normocephalic and atraumatic.  Mouth/Throat: Oropharynx is clear and moist. No oropharyngeal exudate.  Eyes: Conjunctivae are normal. Right eye exhibits no discharge. Left eye exhibits no discharge. No scleral icterus.  Neck: Normal range of motion. Neck supple.  Cardiovascular: Normal rate, regular rhythm, normal heart sounds and intact distal pulses.   Pulmonary/Chest: Effort normal and breath sounds normal. No respiratory distress. No wheezes. No rales.  Abdominal: Soft. Bowel sounds are normal. Exhibits no distension and no mass. There is no tenderness.  Musculoskeletal: Normal range of motion. Exhibits no edema.  Lymphadenopathy:    No cervical adenopathy.  Neurological: Alert and oriented to person, place, and time. Exhibits normal muscle tone. Gait normal. Coordination normal.  Skin: Skin is warm and dry. No rash noted. Not diaphoretic. No erythema. No pallor.  Psychiatric: Mood, memory and judgment normal.  Vitals reviewed.  LABORATORY DATA: Lab Results  Component Value Date   WBC 5.2 02/07/2017   HGB 8.0 (L) 02/07/2017   HCT 23.8 (L) 02/07/2017   MCV 105.1 (H) 02/07/2017   PLT 369 02/07/2017      Chemistry      Component Value Date/Time   NA 139 02/07/2017 1131  NA 135 (L) 01/23/2017 1028   K 4.2 02/07/2017 1131   K 4.6 01/23/2017 1028   CL 102 02/07/2017 1131   CO2 27 02/07/2017 1131   CO2 26 01/23/2017 1028   BUN 14 02/07/2017 1131   BUN 23.0 01/23/2017 1028   CREATININE 1.70 (H) 02/07/2017 1131   CREATININE 1.6 (H) 01/23/2017 1028      Component Value Date/Time   CALCIUM 9.7 02/07/2017 1131   CALCIUM 9.6 01/23/2017 1028   ALKPHOS 160 (H) 02/07/2017 1131   ALKPHOS 129 01/23/2017 1028   AST 33 02/07/2017 1131   AST 21 01/23/2017 1028   ALT 28 02/07/2017 1131   ALT 17 01/23/2017 1028   BILITOT 0.4 02/07/2017 1131   BILITOT 0.98 01/23/2017 1028       RADIOGRAPHIC  STUDIES:  No results found.   ASSESSMENT/PLAN:  Adenocarcinoma of left lung, stage 4 ([facility]) This is a very pleasant 57 year old African-American male with a stage IV non-small cell lung cancer, adenocarcinoma with no actionable mutations. He underwent systemic chemotherapy with carboplatin, Alimta and Avastin status post 6 cycles and has been tolerating the treatment fairly well. He is currently undergoing maintenance treatment with Alimta 500 MG/M2 every 3 weeks, status post 5 cycles. Labs reviewed with Dr. Irene Limbo.  We will delay the start of his chemotherapy by 1 week due to renal insufficiency.  GFR is 50.  The patient is anemic with a hemoglobin 8.0.  He does not report any active bleeding.  He is symptomatic with dyspnea on exertion.  The patient will receive 2 units of packed red blood cells today.  For pain management, the patient will continue morphine as needed.  For hypertension, the patient will continue with his current blood pressure medication.  The patient will return next week for labs and possible chemotherapy if his renal function improves.  He will have a follow-up visit in approximately 4 weeks for evaluation prior to cycle 7 of his chemotherapy. The patient was advised to call immediately if he has any concerning symptoms in the interval. The patient voices understanding of current disease status and treatment options and is in agreement with the current care plan. All questions were answered. The patient knows to call the clinic with any problems, questions or concerns. We can certainly see the patient much sooner if necessary.  Orders Placed This Encounter  Procedures  . Practitioner attestation of consent    I, the ordering practitioner, attest that I have discussed with the patient the benefits, risks, side effects, alternatives, likelihood of achieving goals and potential problems during recovery for the procedure listed.    Standing Status:   Future    Standing  Expiration Date:   02/07/2018    Order Specific Question:   Procedure    Answer:   Blood Product(s)  . Complete patient signature process for consent form    Standing Status:   Future    Standing Expiration Date:   02/07/2018  . Care order/instruction    Transfuse Parameters    Standing Status:   Future    Standing Expiration Date:   02/07/2018    Mikey Bussing, DNP, AGPCNP-BC, AOCNP 02/07/17

## 2017-02-08 ENCOUNTER — Inpatient Hospital Stay: Payer: Medicaid Other

## 2017-02-08 DIAGNOSIS — D649 Anemia, unspecified: Secondary | ICD-10-CM

## 2017-02-08 MED ORDER — DIPHENHYDRAMINE HCL 25 MG PO CAPS
25.0000 mg | ORAL_CAPSULE | Freq: Once | ORAL | Status: AC
  Administered 2017-02-08: 25 mg via ORAL

## 2017-02-08 MED ORDER — ACETAMINOPHEN 325 MG PO TABS
650.0000 mg | ORAL_TABLET | Freq: Once | ORAL | Status: AC
  Administered 2017-02-08: 650 mg via ORAL

## 2017-02-08 MED ORDER — SODIUM CHLORIDE 0.9% FLUSH
10.0000 mL | INTRAVENOUS | Status: AC | PRN
  Administered 2017-02-08: 10 mL
  Filled 2017-02-08: qty 10

## 2017-02-08 MED ORDER — DIPHENHYDRAMINE HCL 25 MG PO CAPS
ORAL_CAPSULE | ORAL | Status: AC
  Filled 2017-02-08: qty 1

## 2017-02-08 MED ORDER — SODIUM CHLORIDE 0.9 % IV SOLN
250.0000 mL | Freq: Once | INTRAVENOUS | Status: AC
  Administered 2017-02-08: 250 mL via INTRAVENOUS

## 2017-02-08 MED ORDER — ACETAMINOPHEN 325 MG PO TABS
ORAL_TABLET | ORAL | Status: AC
  Filled 2017-02-08: qty 2

## 2017-02-08 MED ORDER — HEPARIN SOD (PORK) LOCK FLUSH 100 UNIT/ML IV SOLN
500.0000 [IU] | Freq: Every day | INTRAVENOUS | Status: AC | PRN
  Administered 2017-02-08: 500 [IU]
  Filled 2017-02-08: qty 5

## 2017-02-08 NOTE — Patient Instructions (Signed)

## 2017-02-09 ENCOUNTER — Inpatient Hospital Stay (HOSPITAL_COMMUNITY)
Admission: EM | Admit: 2017-02-09 | Discharge: 2017-02-16 | DRG: 190 | Disposition: A | Discharge status: Home / Self Care | Payer: Medicaid Other | Attending: Internal Medicine | Admitting: Internal Medicine

## 2017-02-09 ENCOUNTER — Emergency Department (HOSPITAL_COMMUNITY): Payer: Medicaid Other

## 2017-02-09 DIAGNOSIS — J440 Chronic obstructive pulmonary disease with acute lower respiratory infection: Secondary | ICD-10-CM | POA: Diagnosis present

## 2017-02-09 DIAGNOSIS — I5032 Chronic diastolic (congestive) heart failure: Secondary | ICD-10-CM | POA: Diagnosis present

## 2017-02-09 DIAGNOSIS — E46 Unspecified protein-calorie malnutrition: Secondary | ICD-10-CM | POA: Diagnosis present

## 2017-02-09 DIAGNOSIS — Z7951 Long term (current) use of inhaled steroids: Secondary | ICD-10-CM

## 2017-02-09 DIAGNOSIS — Z79899 Other long term (current) drug therapy: Secondary | ICD-10-CM

## 2017-02-09 DIAGNOSIS — Z923 Personal history of irradiation: Secondary | ICD-10-CM | POA: Diagnosis not present

## 2017-02-09 DIAGNOSIS — D6481 Anemia due to antineoplastic chemotherapy: Secondary | ICD-10-CM | POA: Diagnosis present

## 2017-02-09 DIAGNOSIS — K5900 Constipation, unspecified: Secondary | ICD-10-CM | POA: Diagnosis present

## 2017-02-09 DIAGNOSIS — I130 Hypertensive heart and chronic kidney disease with heart failure and stage 1 through stage 4 chronic kidney disease, or unspecified chronic kidney disease: Secondary | ICD-10-CM | POA: Diagnosis present

## 2017-02-09 DIAGNOSIS — J449 Chronic obstructive pulmonary disease, unspecified: Secondary | ICD-10-CM | POA: Diagnosis not present

## 2017-02-09 DIAGNOSIS — Z7952 Long term (current) use of systemic steroids: Secondary | ICD-10-CM | POA: Diagnosis not present

## 2017-02-09 DIAGNOSIS — Y95 Nosocomial condition: Secondary | ICD-10-CM | POA: Diagnosis present

## 2017-02-09 DIAGNOSIS — T451X5A Adverse effect of antineoplastic and immunosuppressive drugs, initial encounter: Secondary | ICD-10-CM | POA: Diagnosis present

## 2017-02-09 DIAGNOSIS — J189 Pneumonia, unspecified organism: Secondary | ICD-10-CM | POA: Diagnosis present

## 2017-02-09 DIAGNOSIS — R188 Other ascites: Secondary | ICD-10-CM | POA: Diagnosis present

## 2017-02-09 DIAGNOSIS — Z9981 Dependence on supplemental oxygen: Secondary | ICD-10-CM

## 2017-02-09 DIAGNOSIS — D631 Anemia in chronic kidney disease: Secondary | ICD-10-CM | POA: Diagnosis present

## 2017-02-09 DIAGNOSIS — J9621 Acute and chronic respiratory failure with hypoxia: Secondary | ICD-10-CM | POA: Diagnosis present

## 2017-02-09 DIAGNOSIS — F1010 Alcohol abuse, uncomplicated: Secondary | ICD-10-CM

## 2017-02-09 DIAGNOSIS — G40909 Epilepsy, unspecified, not intractable, without status epilepticus: Secondary | ICD-10-CM | POA: Diagnosis present

## 2017-02-09 DIAGNOSIS — K219 Gastro-esophageal reflux disease without esophagitis: Secondary | ICD-10-CM | POA: Diagnosis present

## 2017-02-09 DIAGNOSIS — D539 Nutritional anemia, unspecified: Secondary | ICD-10-CM | POA: Diagnosis present

## 2017-02-09 DIAGNOSIS — Z9889 Other specified postprocedural states: Secondary | ICD-10-CM

## 2017-02-09 DIAGNOSIS — C7951 Secondary malignant neoplasm of bone: Secondary | ICD-10-CM | POA: Diagnosis present

## 2017-02-09 DIAGNOSIS — N183 Chronic kidney disease, stage 3 (moderate): Secondary | ICD-10-CM | POA: Diagnosis present

## 2017-02-09 DIAGNOSIS — Z66 Do not resuscitate: Secondary | ICD-10-CM | POA: Diagnosis present

## 2017-02-09 DIAGNOSIS — Z681 Body mass index (BMI) 19 or less, adult: Secondary | ICD-10-CM

## 2017-02-09 DIAGNOSIS — R0602 Shortness of breath: Secondary | ICD-10-CM | POA: Diagnosis present

## 2017-02-09 DIAGNOSIS — C3490 Malignant neoplasm of unspecified part of unspecified bronchus or lung: Secondary | ICD-10-CM | POA: Diagnosis not present

## 2017-02-09 DIAGNOSIS — C3492 Malignant neoplasm of unspecified part of left bronchus or lung: Secondary | ICD-10-CM | POA: Diagnosis present

## 2017-02-09 DIAGNOSIS — R609 Edema, unspecified: Secondary | ICD-10-CM | POA: Diagnosis not present

## 2017-02-09 DIAGNOSIS — D649 Anemia, unspecified: Secondary | ICD-10-CM

## 2017-02-09 DIAGNOSIS — J441 Chronic obstructive pulmonary disease with (acute) exacerbation: Secondary | ICD-10-CM | POA: Diagnosis present

## 2017-02-09 DIAGNOSIS — J90 Pleural effusion, not elsewhere classified: Secondary | ICD-10-CM

## 2017-02-09 LAB — TYPE AND SCREEN
ABO/RH(D): O POS
Antibody Screen: NEGATIVE
UNIT DIVISION: 0
UNIT DIVISION: 0

## 2017-02-09 LAB — CBC WITH DIFFERENTIAL/PLATELET
BASOS ABS: 0 10*3/uL (ref 0.0–0.1)
Basophils Relative: 0 %
Eosinophils Absolute: 0 10*3/uL (ref 0.0–0.7)
Eosinophils Relative: 0 %
HEMATOCRIT: 29.3 % — AB (ref 39.0–52.0)
Hemoglobin: 9.8 g/dL — ABNORMAL LOW (ref 13.0–17.0)
LYMPHS PCT: 13 %
Lymphs Abs: 1.4 10*3/uL (ref 0.7–4.0)
MCH: 33.9 pg (ref 26.0–34.0)
MCHC: 33.4 g/dL (ref 30.0–36.0)
MCV: 101.4 fL — AB (ref 78.0–100.0)
Monocytes Absolute: 0.7 10*3/uL (ref 0.1–1.0)
Monocytes Relative: 7 %
NEUTROS ABS: 8.2 10*3/uL — AB (ref 1.7–7.7)
NEUTROS PCT: 80 %
Platelets: 341 10*3/uL (ref 150–400)
RBC: 2.89 MIL/uL — AB (ref 4.22–5.81)
RDW: 17.9 % — ABNORMAL HIGH (ref 11.5–15.5)
WBC: 10.3 10*3/uL (ref 4.0–10.5)

## 2017-02-09 LAB — COMPREHENSIVE METABOLIC PANEL
ALBUMIN: 2.9 g/dL — AB (ref 3.5–5.0)
ALT: 25 U/L (ref 17–63)
AST: 48 U/L — AB (ref 15–41)
Alkaline Phosphatase: 122 U/L (ref 38–126)
Anion gap: 8 (ref 5–15)
BILIRUBIN TOTAL: 0.4 mg/dL (ref 0.3–1.2)
BUN: 20 mg/dL (ref 6–20)
CHLORIDE: 107 mmol/L (ref 101–111)
CO2: 24 mmol/L (ref 22–32)
CREATININE: 1.4 mg/dL — AB (ref 0.61–1.24)
Calcium: 8.5 mg/dL — ABNORMAL LOW (ref 8.9–10.3)
GFR calc Af Amer: >60 mL/min (ref >60)
GFR, EST NON AFRICAN AMERICAN: 54 mL/min — AB (ref >60)
GLUCOSE: 130 mg/dL — AB (ref 65–99)
Potassium: 3.7 mmol/L (ref 3.5–5.1)
Sodium: 139 mmol/L (ref 135–145)
TOTAL PROTEIN: 6.9 g/dL (ref 6.5–8.1)

## 2017-02-09 LAB — BPAM RBC
BLOOD PRODUCT EXPIRATION DATE: 201902152359
Blood Product Expiration Date: 201902162359
ISSUE DATE / TIME: 201901181535
ISSUE DATE / TIME: 201901190930
UNIT TYPE AND RH: 5100
UNIT TYPE AND RH: 5100

## 2017-02-09 LAB — I-STAT TROPONIN, ED: Troponin i, poc: 0 ng/mL (ref 0.00–0.08)

## 2017-02-09 LAB — BRAIN NATRIURETIC PEPTIDE: B NATRIURETIC PEPTIDE 5: 40.7 pg/mL (ref 0.0–100.0)

## 2017-02-09 MED ORDER — IPRATROPIUM-ALBUTEROL 0.5-2.5 (3) MG/3ML IN SOLN
3.0000 mL | Freq: Once | RESPIRATORY_TRACT | Status: AC
  Administered 2017-02-09: 3 mL via RESPIRATORY_TRACT
  Filled 2017-02-09: qty 3

## 2017-02-09 MED ORDER — PIPERACILLIN-TAZOBACTAM 3.375 G IVPB 30 MIN
3.3750 g | Freq: Once | INTRAVENOUS | Status: AC
  Administered 2017-02-09: 3.375 g via INTRAVENOUS
  Filled 2017-02-09: qty 50

## 2017-02-09 MED ORDER — METHYLPREDNISOLONE SODIUM SUCC 125 MG IJ SOLR
125.0000 mg | Freq: Once | INTRAMUSCULAR | Status: AC
  Administered 2017-02-09: 125 mg via INTRAVENOUS
  Filled 2017-02-09: qty 2

## 2017-02-09 MED ORDER — VANCOMYCIN HCL IN DEXTROSE 1-5 GM/200ML-% IV SOLN
1000.0000 mg | Freq: Once | INTRAVENOUS | Status: AC
  Administered 2017-02-09: 1000 mg via INTRAVENOUS
  Filled 2017-02-09: qty 200

## 2017-02-09 NOTE — ED Triage Notes (Signed)
Patient complain of Shortness of breath from home. Per patient he suddenly felt dyspnea while watching football game with his brother. Patient on 2L [place] at home. Patient has history of lung cA and is on chemo treatment  for the last 6-8 months. Patient had 2 units of blood last Friday and yesterday in Cancer center.

## 2017-02-09 NOTE — ED Provider Notes (Addendum)
[facility] DEPT Provider Note   CSN: [id number] Arrival date & time: 02/09/17  1903     History   Chief Complaint Chief Complaint  Patient presents with  . Shortness of Breath    HPI [name] is a 57 y.o. male.  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  The patient states that he received 2 transfusions last week.  He did not have significant improvement in his shortness of breath.  Over the last 24 hours, he has had worsening shortness of breath as well as increasing cough and sputum production.  He was sitting with his brother today, watching TV, when he noticed that he began to feel more short of breath.  He began coughing more than usual.  He was also wheezing.  He tried to walk across the room to go outside, but was unable to do so due to severe dyspnea.  He denies any associated chest pain.  Does have some mild nausea and upper abdominal pain with this.  He subsequent presents for evaluation.  He is supposed to wear oxygen at home but often does not need it, though he has had increased usage in the last 24-48 hours due to shortness of breath.  He felt like his 3 L was not giving him enough air today so he tried to increase it.  Past Medical History:  Diagnosis Date  . Abdominal pain 06/04/2016  . Adenocarcinoma of left lung, stage 4 ([place]) 05/02/2016  . Bronchitis due to tobacco use ([place])   . Cancer ([place])    Lung  . Dehydration 06/04/2016  . Encounter for antineoplastic chemotherapy 05/02/2016  . Goals of care, counseling/discussion 05/02/2016  . HTN (hypertension) 10/30/2016  . Recurrent upper respiratory infection (URI)   . Seizures ([place]) 05/2011   new onset  . Shortness of breath     Patient Active Problem List   Diagnosis Date Noted  . Anemia 02/07/2017   . Severe malnutrition ([place]) 01/03/2017  . DOE (dyspnea on exertion) 12/30/2016  . Nausea   . Hypervolemia   . HCAP (healthcare-associated pneumonia) 12/19/2016  . SOB (shortness of breath) 12/19/2016  . Erosive gastropathy 12/18/2016  . Gastritis 12/18/2016  . Hematemesis 12/16/2016  . Intractable nausea and vomiting 12/16/2016  . HTN (hypertension) 10/30/2016  . Chronic obstructive pulmonary disease ([place]) 09/02/2016  . COPD (chronic obstructive pulmonary disease) ([place]) 08/19/2016  . Dehydration 06/04/2016  . Abdominal pain 06/04/2016  . 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 Adenocarcinoma of left lung, stage 4 ([place]) 05/02/2016  . Goals of care, counseling/discussion 05/02/2016  . Encounter for antineoplastic chemotherapy 05/02/2016  . Tobacco abuse 05/30/2011  . Alcohol abuse 05/30/2011  . Seizure ([place]) 05/28/2011    Past Surgical History:  Procedure Laterality Date  . ESOPHAGOGASTRODUODENOSCOPY (EGD) WITH PROPOFOL N/A 12/17/2016   Procedure: ESOPHAGOGASTRODUODENOSCOPY (EGD) WITH PROPOFOL;  Surgeon: Ladene Artist, MD;  Location: [facility] ENDOSCOPY;  Service: Endoscopy;  Laterality: N/A;  . IR FLUORO GUIDE PORT INSERTION RIGHT  05/13/2016  . IR US GUIDE VASC ACCESS RIGHT  05/13/2016  . NO PAST SURGERIES         Home Medications    Prior to Admission medications   Medication Sig Start Date End Date Taking? Authorizing Provider  acetaminophen (TYLENOL) 500 MG tablet Take 500 mg by mouth 2 (two)  times daily as needed for mild pain or headache.    [provider]  amLODipine (NORVASC) 10 MG tablet Take 1 tablet (10 mg total) by mouth daily. 10/30/16   Curt Bears, MD  Chlorphen-Phenyleph-ASA (ALKA-SELTZER PLUS COLD PO) Take 2 tablets by mouth at bedtime as needed (COUGH).    [provider]  dexamethasone (DECADRON) 4 MG tablet 4 mg by mouth twice a day the day before, day of and day after the chemotherapy every 3 weeks 09/25/16   Curt Bears, MD    dextromethorphan-guaiFENesin [facility] DM) 30-600 MG 12hr tablet Take 2 tablets by mouth 2 (two) times daily. 5 Days    [provider]  Ipratropium-Albuterol (COMBIVENT) 20-100 MCG/ACT AERS respimat Inhale 1 puff into the lungs every 6 (six) hours. Patient taking differently: Inhale 1 puff into the lungs every 6 (six) hours as needed for wheezing or shortness of breath.  07/22/16   Curt Bears, MD  lidocaine-prilocaine (EMLA) cream Apply 1 application topically as needed. Patient not taking: Reported on 02/07/2017 05/08/16   Curt Bears, MD  loratadine (CLARITIN) 10 MG tablet Take 1 tablet (10 mg total) by mouth daily. 12/25/16   Eugenie Filler, MD  mometasone-formoterol (DULERA) 100-5 MCG/ACT AERO Inhale 2 puffs into the lungs 2 (two) times daily. 12/24/16   Eugenie Filler, MD  morphine (MSIR) 15 MG tablet Take 1 tablet (15 mg total) by mouth every 4 (four) hours as needed for severe pain. 01/09/17   Curt Bears, MD  ondansetron (ZOFRAN ODT) 4 MG disintegrating tablet Take 1 tablet (4 mg total) by mouth every 8 (eight) hours as needed for nausea or vomiting. Patient not taking: Reported on 02/07/2017 12/18/16   Eugenie Filler, MD  pantoprazole (PROTONIX) 40 MG tablet Take 1 tablet (40 mg total) by mouth 2 (two) times daily. 12/18/16   Eugenie Filler, MD    Family History Family History  Problem Relation Age of Onset  . Cancer Father   . Diabetes Mellitus II Mother     Social History Social History   Tobacco Use  . Smoking status: Current Some Day Smoker    Packs/day: 0.10    Years: 30.00    Pack years: 3.00    Types: Cigarettes  . Smokeless tobacco: Never Used  Substance Use Topics  . Alcohol use: No    Frequency: Never  . Drug use: No     Allergies   Patient has no known allergies.   Review of Systems Review of Systems  Constitutional: Positive for fatigue.  Respiratory: Positive for cough and shortness of breath.   Neurological:  Positive for weakness.  All other systems reviewed and are negative.    Physical Exam Updated Vital Signs BP (!) 147/92   Pulse 85   Temp (!) 97.5 F (36.4 C) (Oral)   Resp 20   SpO2 100%   Physical Exam  Constitutional: He is oriented to person, place, and time. He appears well-developed and well-nourished.  Chronically ill appearing  HENT:  Head: Normocephalic and atraumatic.  Eyes: Conjunctivae are normal.  Neck: Neck supple.  Cardiovascular: Normal rate, regular rhythm and normal heart sounds. Exam reveals no friction rub.  No murmur heard. Pulmonary/Chest: Effort normal. Tachypnea noted. No respiratory distress. He has wheezes in the right middle field, the right lower field, the left middle field and the left lower field. He has rhonchi in the right lower field and the left lower field. He has no rales.  Abdominal: He  exhibits no distension.  Musculoskeletal: He exhibits no edema.  Neurological: He is alert and oriented to person, place, and time. He exhibits normal muscle tone.  Skin: Skin is warm. Capillary refill takes less than 2 seconds.  Psychiatric: He has a normal mood and affect.  Nursing note and vitals reviewed.    ED Treatments / Results  Labs (all labs ordered are listed, but only abnormal results are displayed) Labs Reviewed  CBC WITH DIFFERENTIAL/PLATELET - Abnormal; Notable for the following components:      Result Value   RBC 2.89 (*)    Hemoglobin 9.8 (*)    HCT 29.3 (*)    MCV 101.4 (*)    RDW 17.9 (*)    Neutro Abs 8.2 (*)    All other components within normal limits  COMPREHENSIVE METABOLIC PANEL - Abnormal; Notable for the following components:   Glucose, Bld 130 (*)    Creatinine, Ser 1.40 (*)    Calcium 8.5 (*)    Albumin 2.9 (*)    AST 48 (*)    GFR calc non Af Amer 54 (*)    All other components within normal limits  CULTURE, BLOOD (ROUTINE X 2)  CULTURE, BLOOD (ROUTINE X 2)  BRAIN NATRIURETIC PEPTIDE  I-STAT TROPONIN, ED     EKG Normal sinus rhythm with pacs. VR 77. PR 148. QRS 86. QTc 479. No significant change since last tracing.  Radiology Dg Chest 2 View  Result Date: 02/09/2017 CLINICAL DATA:  Shortness of breath history of lung cancer EXAM: CHEST  2 VIEW COMPARISON:  01/07/2017, 01/03/2017, CT chest 12/31/2016 FINDINGS: Right-sided central venous port tip overlies the proximal right atrium.stable appearance of spiculated left upper lobe opacity. Development of focal airspace disease at the lateral left lung base and adjacent tiny pleural effusion. Stable cardiomediastinal silhouette. No pneumothorax. IMPRESSION: 1. Interim development of mild focal airspace disease at the left lung base with tiny left pleural effusion, possible pneumonia 2. Otherwise no significant interval change Electronically Signed   By: Donavan Foil M.D.   On: 02/09/2017 21:55   Dg Abdomen 1 View  Result Date: 02/09/2017 CLINICAL DATA:  Severe abdominal pain EXAM: ABDOMEN - 1 VIEW COMPARISON:  12/23/2016 radiograph FINDINGS: Nonobstructed bowel-gas pattern. Coarse calcification in the left pelvis. Metallic density in the low pelvis. IMPRESSION: Nonobstructed gas pattern Electronically Signed   By: Donavan Foil M.D.   On: 02/09/2017 21:56    Procedures Procedures (including critical care time)  Medications Ordered in ED Medications  ipratropium-albuterol (DUONEB) 0.5-2.5 (3) MG/3ML nebulizer solution 3 mL (not administered)  methylPREDNISolone sodium succinate (SOLU-MEDROL) 125 mg/2 mL injection 125 mg (not administered)  vancomycin (VANCOCIN) IVPB 1000 mg/200 mL premix (not administered)  piperacillin-tazobactam (ZOSYN) IVPB 3.375 g (not administered)  ipratropium-albuterol (DUONEB) 0.5-2.5 (3) MG/3ML nebulizer solution 3 mL (3 mLs Nebulization Given 02/09/17 2031)     Initial Impression / Assessment and Plan / ED Course  I have reviewed the triage vital signs and the nursing notes.  Pertinent labs & imaging results that  were available during my care of the patient were reviewed by me and considered in my medical decision making (see chart for details).     57 year old male with past medical history of stage IV adenocarcinoma, COPD, here with shortness of breath and general fatigue.  He has moderate wheezing and rhonchi on exam.  Chest x-ray concerning for superimposed pneumonia.  White blood cell count is normal but elevated significantly from his previous values.  He is  on chemotherapy.  Will cover him broadly, plan for admission given that he has increasing O2 requirement, shortness of breath, and pneumonia on chest x-ray.  Final Clinical Impressions(s) / ED Diagnoses   Final diagnoses:  HCAP (healthcare-associated pneumonia)    ED Discharge Orders    None       Duffy Bruce, MD 02/09/17 2245    Duffy Bruce, MD 02/24/17 1007

## 2017-02-09 NOTE — ED Notes (Signed)
Bed: SW54 Expected date:  Expected time:  Means of arrival:  Comments: 57 yo SOB, COPD, CA pt

## 2017-02-09 NOTE — Progress Notes (Signed)
A consult was received from an ED physician for Vancomycin and Zosyn per pharmacy dosing.  The patient's profile has been reviewed for ht/wt/allergies/indication/available labs.   A one time order has been placed for Vancomycin 1gm iv x1, and Zosyn 3.375gm iv x1.  Further antibiotics/pharmacy consults should be ordered by admitting physician if indicated.                       Thank you, [name] 02/09/2017  10:33 PM

## 2017-02-09 NOTE — H&P (Addendum)
History and Physical    [name] OZH:[id number] DOB: 10-19-1960 DOA: 02/09/2017  Referring MD/NP/PA: Dr. Duffy Bruce PCP: Patient, No Pcp Per  Patient coming from: home  Chief Complaint: Shortness of breath  I have personally briefly reviewed patient's old medical records in [place]   HPI: [name] is a 57 y.o. male with medical history significant of history of stage IV NSCLC undergoing chemotherapy, HTN, diastolic CHF underlying emphysema/COPD, intermittently on 2 L of oxygen, seizure disorder, and gastritis; who presented with complaints of worsening shortness of breath and productive cough over the last 3 days.  Associated symptoms included complaints of chills, wheezing, intermittent chest pain, nausea abdominal distention, and upper abdominal pain(ongoing over the last 7 months). Due to his symptoms he reports having to utilize his oxygen more than normal.  Denies having any fever, diarrhea, constipation, coughing up blood, or blood in urine/stool. [name] of oncology and notes that his last chemotherapy session was canceled due to him needing a blood transfusion.  He received 1 unit of blood 2 days ago, and then 1 unit of blood yesterday.  Despite receiving blood reports no change in his symptoms.  He reports feeling acutely more short of breath this evening while watching TV.  Last admitted into the hospital 1 month ago for acute respiratory failure secondary to pulmonary edema requiring thoracentesis.    ED Course: Upon admission into the emergency department patient was noted to be afebrile, blood pressure 140/96-140 7/97, O2 saturations 98-100% on 3 L of nasal cannula oxygen.  Labs revealed WBC 10.3, hemoglobin 9.8, BUN 20, creatinine 1.4, albumin 2.9, AST 48, ALT 25, troponin 0, and BNP 40.7.  Abdominal x-ray showed no clear signs of obstruction chest x-ray showed focal airspace opacity in the left lung base with tiny pleural effusion suggestive of pneumonia.   Patient was started on empiric antibiotics of vancomycin and cefepime for the possibility of a community-acquired pneumonia.  Review of Systems  Constitutional: Positive for chills. Negative for fever.  HENT: Negative for ear discharge and ear pain.   Eyes: Negative for photophobia and pain.  Respiratory: Positive for cough, sputum production, shortness of breath and wheezing.   Cardiovascular: Positive for chest pain. Negative for leg swelling.  Gastrointestinal: Positive for abdominal pain and nausea. Negative for constipation, diarrhea and vomiting.  Genitourinary: Negative for dysuria and frequency.  Neurological: Negative for speech change and focal weakness.  Psychiatric/Behavioral: Negative for hallucinations and memory loss.    Past Medical History:  Diagnosis Date  . Abdominal pain 06/04/2016  . Adenocarcinoma of left lung, stage 4 ([place]) 05/02/2016  . Bronchitis due to tobacco use ([place])   . Cancer ([place])    Lung  . Dehydration 06/04/2016  . Encounter for antineoplastic chemotherapy 05/02/2016  . Goals of care, counseling/discussion 05/02/2016  . HTN (hypertension) 10/30/2016  . Recurrent upper respiratory infection (URI)   . Seizures ([place]) 05/2011   new onset  . Shortness of breath     Past Surgical History:  Procedure Laterality Date  . ESOPHAGOGASTRODUODENOSCOPY (EGD) WITH PROPOFOL N/A 12/17/2016   Procedure: ESOPHAGOGASTRODUODENOSCOPY (EGD) WITH PROPOFOL;  Surgeon: Ladene Artist, MD;  Location: [facility] END[facility];  Service: Endoscopy;  Laterality: N/A;  . IR FLUORO GUIDE PORT INSERTION RIGHT  05/13/2016  . IR US GUIDE VASC ACCESS RIGHT  05/13/2016  . NO PAST SURGERIES       reports that he has been smoking cigarettes.  He has a 3.00 pack-year smoking history. he  has never used smokeless tobacco. He reports that he does not drink alcohol or use drugs.  No Known Allergies  Family History  Problem Relation Age of Onset  . Cancer Father   . Diabetes Mellitus II Mother      Prior to Admission medications   Medication Sig Start Date End Date Taking? Authorizing Provider  acetaminophen (TYLENOL) 500 MG tablet Take 500 mg by mouth 2 (two) times daily as needed for mild pain or headache.    [provider]  amLODipine (NORVASC) 10 MG tablet Take 1 tablet (10 mg total) by mouth daily. 10/30/16   Curt Bears, MD  Chlorphen-Phenyleph-ASA (ALKA-SELTZER PLUS COLD PO) Take 2 tablets by mouth at bedtime as needed (COUGH).    [provider]  dexamethasone (DECADRON) 4 MG tablet 4 mg by mouth twice a day the day before, day of and day after the chemotherapy every 3 weeks 09/25/16   Curt Bears, MD  dextromethorphan-guaiFENesin [facility] DM) 30-600 MG 12hr tablet Take 2 tablets by mouth 2 (two) times daily. 5 Days    [provider]  Ipratropium-Albuterol (COMBIVENT) 20-100 MCG/ACT AERS respimat Inhale 1 puff into the lungs every 6 (six) hours. Patient taking differently: Inhale 1 puff into the lungs every 6 (six) hours as needed for wheezing or shortness of breath.  07/22/16   Curt Bears, MD  lidocaine-prilocaine (EMLA) cream Apply 1 application topically as needed. Patient not taking: Reported on 02/07/2017 05/08/16   Curt Bears, MD  loratadine (CLARITIN) 10 MG tablet Take 1 tablet (10 mg total) by mouth daily. 12/25/16   Eugenie Filler, MD  mometasone-formoterol (DULERA) 100-5 MCG/ACT AERO Inhale 2 puffs into the lungs 2 (two) times daily. 12/24/16   Eugenie Filler, MD  morphine (MSIR) 15 MG tablet Take 1 tablet (15 mg total) by mouth every 4 (four) hours as needed for severe pain. 01/09/17   Curt Bears, MD  ondansetron (ZOFRAN ODT) 4 MG disintegrating tablet Take 1 tablet (4 mg total) by mouth every 8 (eight) hours as needed for nausea or vomiting. Patient not taking: Reported on 02/07/2017 12/18/16   Eugenie Filler, MD  pantoprazole (PROTONIX) 40 MG tablet Take 1 tablet (40 mg total) by mouth 2 (two) times daily.  12/18/16   Eugenie Filler, MD    Physical Exam:  Constitutional: Chronically ill-appearing male Vitals:   02/09/17 1918 02/09/17 2030 02/09/17 2059 02/09/17 2100  BP: (!) 147/97 (!) 140/96 (!) 140/96 (!) 147/92  Pulse: 93 82 84 85  Resp: 18  20   Temp: (!) 97.5 F (36.4 C)     TempSrc: Oral     SpO2: 100% 100% 98% 100%   Eyes: PERRL, lids and conjunctivae normal ENMT: Mucous membranes are moist. Posterior pharynx clear of any exudate or lesions.Normal dentition.  Neck: normal, supple, no masses, no thyromegaly Respiratory: Normal respiratory effort at this time with decreased aeration and mild wheezing appreciated Cardiovascular: Regular rate and rhythm, no murmurs / rubs / gallops. No extremity edema. 2+ pedal pulses. No carotid bruits.  Abdomen: no tenderness, no masses palpated. No hepatosplenomegaly. Bowel sounds positive.  Musculoskeletal: no clubbing / cyanosis. No joint deformity upper and lower extremities. Good ROM, no contractures. Normal muscle tone.  Skin: no rashes, lesions, ulcers. No induration Neurologic: CN 2-12 grossly intact. Sensation intact, DTR normal. Strength 5/5 in all 4.  Psychiatric: Normal judgment and insight. Alert and oriented x 3. Normal mood.     Labs on Admission: I have personally  reviewed following labs and imaging studies  CBC: Recent Labs  Lab 02/07/17 1131 02/09/17 2014  WBC 5.2 10.3  NEUTROABS 4.5 8.2*  HGB 8.0* 9.8*  HCT 23.8* 29.3*  MCV 105.1* 101.4*  PLT 369 706   Basic Metabolic Panel: Recent Labs  Lab 02/07/17 1131 02/09/17 2014  NA 139 139  K 4.2 3.7  CL 102 107  CO2 27 24  GLUCOSE 130 130*  BUN 14 20  CREATININE 1.70* 1.40*  CALCIUM 9.7 8.5*   GFR: Estimated Creatinine Clearance: 48.2 mL/min (A) (by C-G formula based on SCr of 1.4 mg/dL (H)). Liver Function Tests: Recent Labs  Lab 02/07/17 1131 02/09/17 2014  AST 33 48*  ALT 28 25  ALKPHOS 160* 122  BILITOT 0.4 0.4  PROT 7.9 6.9  ALBUMIN 3.0* 2.9*     No results for input(s): LIPASE, AMYLASE in the last 168 hours. No results for input(s): AMMONIA in the last 168 hours. Coagulation Profile: No results for input(s): INR, PROTIME in the last 168 hours. Cardiac Enzymes: No results for input(s): CKTOTAL, CKMB, CKMBINDEX, TROPONINI in the last 168 hours. BNP (last 3 results) No results for input(s): PROBNP in the last 8760 hours. HbA1C: No results for input(s): HGBA1C in the last 72 hours. CBG: No results for input(s): GLUCAP in the last 168 hours. Lipid Profile: No results for input(s): CHOL, HDL, LDLCALC, TRIG, CHOLHDL, LDLDIRECT in the last 72 hours. Thyroid Function Tests: No results for input(s): TSH, T4TOTAL, FREET4, T3FREE, THYROIDAB in the last 72 hours. Anemia Panel: No results for input(s): VITAMINB12, FOLATE, FERRITIN, TIBC, IRON, RETICCTPCT in the last 72 hours. Urine analysis:    Component Value Date/Time   COLORURINE YELLOW 12/30/2016 2049   APPEARANCEUR CLEAR 12/30/2016 2049   LABSPEC 1.005 12/30/2016 2049   PHURINE 7.0 12/30/2016 2049   GLUCOSEU NEGATIVE 12/30/2016 2049   HGBUR NEGATIVE 12/30/2016 2049   [place] 12/30/2016 2049   [place] NEGATIVE 12/30/2016 2049   PROTEINUR NEGATIVE 12/30/2016 2049   UROBILINOGEN 0.2 05/29/2011 0122   NITRITE NEGATIVE 12/30/2016 2049   LEUKOCYTESUR NEGATIVE 12/30/2016 2049   Sepsis Labs: No results found for this or any previous visit (from the past 240 hour(s)).   Radiological Exams on Admission: Dg Chest 2 View  Result Date: 02/09/2017 CLINICAL DATA:  Shortness of breath history of lung cancer EXAM: CHEST  2 VIEW COMPARISON:  01/07/2017, 01/03/2017, CT chest 12/31/2016 FINDINGS: Right-sided central venous port tip overlies the proximal right atrium.stable appearance of spiculated left upper lobe opacity. Development of focal airspace disease at the lateral left lung base and adjacent tiny pleural effusion. Stable cardiomediastinal silhouette. No pneumothorax.  IMPRESSION: 1. Interim development of mild focal airspace disease at the left lung base with tiny left pleural effusion, possible pneumonia 2. Otherwise no significant interval change Electronically Signed   By: Donavan Foil M.D.   On: 02/09/2017 21:55   Dg Abdomen 1 View  Result Date: 02/09/2017 CLINICAL DATA:  Severe abdominal pain EXAM: ABDOMEN - 1 VIEW COMPARISON:  12/23/2016 radiograph FINDINGS: Nonobstructed bowel-gas pattern. Coarse calcification in the left pelvis. Metallic density in the low pelvis. IMPRESSION: Nonobstructed gas pattern Electronically Signed   By: Donavan Foil M.D.   On: 02/09/2017 21:56    Chest x-ray: Independently reviewed.  Possible left lower lobe infiltrate  Assessment/Plan Healthcare associated pneumonia: Acute.  Patient presents with worsening shortness of breath and cough.  X-ray reveals signs of a new left lower lobe infiltrate. - Admit to a telemetry bed -  Focus pneumonia order set initiated - Follow-up sputum and blood cultures - Continue empiric antibiotics of vancomycin and cefepime - Mucinex  Acute on chronic respiratory failure, COPD acute exacerbation: Patient presents with shortness of breath complaints maintaining O2 saturations on 3 L of nasal cannula oxygen.  No significant wheezes appreciated during my evaluation. - Continuous pulse oximetry with nasal cannula oxygen as needed - Substitute Brovana and budesonide nebs forDulera - DuoNeb's QID and prn shortness of breath/wheezing - Solu-Medrol  Diastolic CHF: Last EF noted to be 55-60% with grade 1 diastolic dysfunction in 16/1096.  Patient does not appear grossly fluid overloaded at this time. - Monitor ins and outs and daily weights  Anemia related to chemotherapy: Hemoglobin 9.8 on admission.  Hemoglobin was noted to be 8 just 3 days ago prior to his transfusion of 2 units of packed red blood cells. - Recheck CBC in a.m.  Stage IV adenocarcinoma of the lung: Patient currently receiving  chemotherapy. Followed by [name]. - Continue current pain regimen - [name] added to care team  Essential hypertension: Stable - Continue amlodipine  GERD - Continue Protonix   DVT prophylaxis: Lovenox Code Status: full Family Communication: none present at bedside Disposition Plan: TBD  Consults called: none Admission status: Inpatient   Norval Morton MD [facility] Pager [phone]   If 7PM-7AM, please contact night-coverage [email or web address] Password [email or web address]  02/09/2017, 11:01 PM

## 2017-02-10 ENCOUNTER — Other Ambulatory Visit: Payer: Self-pay

## 2017-02-10 LAB — CBC
HEMATOCRIT: 29.8 % — AB (ref 39.0–52.0)
Hemoglobin: 9.8 g/dL — ABNORMAL LOW (ref 13.0–17.0)
MCH: 33.4 pg (ref 26.0–34.0)
MCHC: 32.9 g/dL (ref 30.0–36.0)
MCV: 101.7 fL — ABNORMAL HIGH (ref 78.0–100.0)
Platelets: 379 10*3/uL (ref 150–400)
RBC: 2.93 MIL/uL — ABNORMAL LOW (ref 4.22–5.81)
RDW: 18 % — AB (ref 11.5–15.5)
WBC: 9.4 10*3/uL (ref 4.0–10.5)

## 2017-02-10 LAB — BASIC METABOLIC PANEL
Anion gap: 9 (ref 5–15)
BUN: 19 mg/dL (ref 6–20)
CALCIUM: 9.1 mg/dL (ref 8.9–10.3)
CO2: 24 mmol/L (ref 22–32)
Chloride: 104 mmol/L (ref 101–111)
Creatinine, Ser: 1.47 mg/dL — ABNORMAL HIGH (ref 0.61–1.24)
GFR calc Af Amer: 59 mL/min — ABNORMAL LOW (ref >60)
GFR calc non Af Amer: 51 mL/min — ABNORMAL LOW (ref >60)
GLUCOSE: 161 mg/dL — AB (ref 65–99)
Potassium: 4.1 mmol/L (ref 3.5–5.1)
Sodium: 137 mmol/L (ref 135–145)

## 2017-02-10 LAB — MRSA PCR SCREENING: MRSA by PCR: NEGATIVE

## 2017-02-10 LAB — STREP PNEUMONIAE URINARY ANTIGEN: STREP PNEUMO URINARY ANTIGEN: NEGATIVE

## 2017-02-10 MED ORDER — ONDANSETRON HCL 4 MG/2ML IJ SOLN: 4.0000 mg | Freq: Four times a day (QID) | INTRAMUSCULAR | Status: DC | PRN

## 2017-02-10 MED ORDER — IPRATROPIUM-ALBUTEROL 0.5-2.5 (3) MG/3ML IN SOLN
3.0000 mL | Freq: Four times a day (QID) | RESPIRATORY_TRACT | Status: DC
  Administered 2017-02-10: 3 mL via RESPIRATORY_TRACT
  Filled 2017-02-10: qty 3

## 2017-02-10 MED ORDER — BUDESONIDE 0.5 MG/2ML IN SUSP
0.5000 mg | Freq: Two times a day (BID) | RESPIRATORY_TRACT | Status: DC
  Administered 2017-02-10 – 2017-02-16 (×13): 0.5 mg via RESPIRATORY_TRACT
  Filled 2017-02-10 (×13): qty 2

## 2017-02-10 MED ORDER — IPRATROPIUM-ALBUTEROL 0.5-2.5 (3) MG/3ML IN SOLN
3.0000 mL | Freq: Three times a day (TID) | RESPIRATORY_TRACT | Status: DC
  Administered 2017-02-10 – 2017-02-16 (×17): 3 mL via RESPIRATORY_TRACT
  Filled 2017-02-10 (×19): qty 3

## 2017-02-10 MED ORDER — MORPHINE SULFATE 15 MG PO TABS
15.0000 mg | ORAL_TABLET | ORAL | Status: DC | PRN
  Administered 2017-02-10 – 2017-02-16 (×11): 15 mg via ORAL
  Filled 2017-02-10 (×14): qty 1

## 2017-02-10 MED ORDER — ARFORMOTEROL TARTRATE 15 MCG/2ML IN NEBU
15.0000 ug | INHALATION_SOLUTION | Freq: Two times a day (BID) | RESPIRATORY_TRACT | Status: DC
  Administered 2017-02-10 – 2017-02-16 (×13): 15 ug via RESPIRATORY_TRACT
  Filled 2017-02-10 (×12): qty 2

## 2017-02-10 MED ORDER — AMLODIPINE BESYLATE 10 MG PO TABS
10.0000 mg | ORAL_TABLET | Freq: Every day | ORAL | Status: DC
  Administered 2017-02-10 – 2017-02-13 (×4): 10 mg via ORAL
  Filled 2017-02-10 (×4): qty 1

## 2017-02-10 MED ORDER — DEXTROSE 5 % IV SOLN
2.0000 g | Freq: Two times a day (BID) | INTRAVENOUS | Status: DC
  Administered 2017-02-10 – 2017-02-14 (×8): 2 g via INTRAVENOUS
  Filled 2017-02-10 (×9): qty 2

## 2017-02-10 MED ORDER — METHYLPREDNISOLONE SODIUM SUCC 125 MG IJ SOLR
60.0000 mg | Freq: Three times a day (TID) | INTRAMUSCULAR | Status: DC
Start: 2017-02-10 — End: 2017-02-14
  Administered 2017-02-10 – 2017-02-14 (×13): 60 mg via INTRAVENOUS
  Filled 2017-02-10 (×12): qty 2

## 2017-02-10 MED ORDER — PANTOPRAZOLE SODIUM 40 MG PO TBEC
40.0000 mg | DELAYED_RELEASE_TABLET | Freq: Two times a day (BID) | ORAL | Status: DC
  Administered 2017-02-10 – 2017-02-16 (×14): 40 mg via ORAL
  Filled 2017-02-10 (×14): qty 1

## 2017-02-10 MED ORDER — LORATADINE 10 MG PO TABS
10.0000 mg | ORAL_TABLET | Freq: Every day | ORAL | Status: DC
  Administered 2017-02-10 – 2017-02-16 (×7): 10 mg via ORAL
  Filled 2017-02-10 (×7): qty 1

## 2017-02-10 MED ORDER — ENOXAPARIN SODIUM 40 MG/0.4ML ~~LOC~~ SOLN
40.0000 mg | Freq: Every day | SUBCUTANEOUS | Status: DC
  Administered 2017-02-10 – 2017-02-11 (×2): 40 mg via SUBCUTANEOUS
  Filled 2017-02-10 (×3): qty 0.4

## 2017-02-10 MED ORDER — LIDOCAINE-PRILOCAINE 2.5-2.5 % EX CREA
1.0000 "application " | TOPICAL_CREAM | CUTANEOUS | Status: DC | PRN
  Filled 2017-02-10: qty 5

## 2017-02-10 MED ORDER — DEXTROSE 5 % IV SOLN
1.0000 g | Freq: Three times a day (TID) | INTRAVENOUS | Status: DC
  Administered 2017-02-10: 1 g via INTRAVENOUS
  Filled 2017-02-10 (×2): qty 1

## 2017-02-10 MED ORDER — VANCOMYCIN HCL IN DEXTROSE 750-5 MG/150ML-% IV SOLN
750.0000 mg | INTRAVENOUS | Status: DC
  Administered 2017-02-10: 750 mg via INTRAVENOUS
  Filled 2017-02-10: qty 150

## 2017-02-10 MED ORDER — ACETAMINOPHEN 650 MG RE SUPP: 650.0000 mg | Freq: Four times a day (QID) | RECTAL | Status: DC | PRN

## 2017-02-10 MED ORDER — ORAL CARE MOUTH RINSE
15.0000 mL | Freq: Two times a day (BID) | OROMUCOSAL | Status: DC
  Administered 2017-02-10 – 2017-02-13 (×5): 15 mL via OROMUCOSAL

## 2017-02-10 MED ORDER — ONDANSETRON HCL 4 MG PO TABS: 4.0000 mg | ORAL_TABLET | Freq: Four times a day (QID) | ORAL | Status: DC | PRN

## 2017-02-10 MED ORDER — GUAIFENESIN ER 600 MG PO TB12
600.0000 mg | ORAL_TABLET | Freq: Two times a day (BID) | ORAL | Status: DC
  Administered 2017-02-10 – 2017-02-16 (×14): 600 mg via ORAL
  Filled 2017-02-10 (×14): qty 1

## 2017-02-10 MED ORDER — ACETAMINOPHEN 325 MG PO TABS
650.0000 mg | ORAL_TABLET | Freq: Four times a day (QID) | ORAL | Status: DC | PRN
  Administered 2017-02-11: 650 mg via ORAL
  Filled 2017-02-10: qty 2

## 2017-02-10 MED ORDER — MOMETASONE FURO-FORMOTEROL FUM 100-5 MCG/ACT IN AERO
2.0000 | INHALATION_SPRAY | Freq: Two times a day (BID) | RESPIRATORY_TRACT | Status: DC
  Filled 2017-02-10: qty 8.8

## 2017-02-10 MED ORDER — IPRATROPIUM-ALBUTEROL 0.5-2.5 (3) MG/3ML IN SOLN: 3.0000 mL | RESPIRATORY_TRACT | Status: DC | PRN

## 2017-02-10 NOTE — Progress Notes (Addendum)
Patient ID: [name], male   DOB: 1960-05-14, 57 y.o.   MRN: [id number]  PROGRESS NOTE    [name]  ACZ:[id number] DOB: 1960-12-24 DOA: 02/09/2017  PCP: Patient, No Pcp Per   Brief Narrative:  57 y.o. male with medical history significant of history of stage IV NSCLC undergoing chemotherapy, HTN, diastolic CHF underlying emphysema/COPD, intermittently on 2 L of oxygen, seizure disorder, and gastritis who presented with complaints of worsening shortness of breath and productive cough over the last 3 days. Abdominal x-ray showed no clear signs of obstruction. CXR showed focal airspace opacity in the left lung base with tiny pleural effusion suggestive of pneumonia. Pt started on vanco and cefepime.   Assessment & Plan:   Principal Problem: HCAP (healthcare-associated pneumonia)  - Continue vanco and cefepime  - Blood cx pending  - Sputum cx pending - Strep pneumonia negative - Legionella pending   Active Problems: Acute respiratory failure with hypoxia / Acute COPD exacerbation  - Continue Duoneb TID scheduled - Continue Pulmicort and brovana BID - Continue solumedrol 60 mg IV Q 8 hours   Adenocarcinoma of left lung, stage 4 (HCC) - Management per oncology  CKD stage 3 - Baseline Cr in 12/2016 was 1.8 - Cr within baseline range   Anemia of CKD - Hgb stable    DVT prophylaxis: Lovenox subQ Code Status: DNR/DNI Family Communication: no family at the bedside Disposition Plan: home once resp status stable    Consultants:   None  Procedures:   None  Antimicrobials:   Vanco and cefepime 1/20 -->   Subjective: No overnight events.  Objective: Vitals:   02/10/17 0000 02/10/17 0127 02/10/17 0636 02/10/17 0844  BP: 135/86 (!) 146/86 (!) 129/92   Pulse: 78 77 67   Resp: (!) 21 (!) 24 (!) 24   Temp:  97.7 F (36.5 C) 97.7 F (36.5 C)   TempSrc:  Oral Oral   SpO2: 100% 97% 100% 99%  Weight:  55.8 kg (123 lb 0.3 oz)    Height:  5\' 8"  (1.727 m)       Intake/Output Summary (Last 24 hours) at 02/10/2017 1433 Last data filed at 02/10/2017 0857 Gross per 24 hour  Intake 780 ml  Output 500 ml  Net 280 ml   Filed Weights   02/10/17 0127  Weight: 55.8 kg (123 lb 0.3 oz)    Examination:  General exam: Appears calm and comfortable  Respiratory system: rhonchi in lower bases, no wheezing  Cardiovascular system: S1 & S2 heard, RRR. Gastrointestinal system: Abdomen is nondistended, soft and nontender. No organomegaly or masses felt. Normal bowel sounds heard. Central nervous system: Alert and oriented. No focal neurological deficits. Extremities: Symmetric 5 x 5 power. Skin: No rashes, lesions or ulcers Psychiatry: Judgement and insight appear normal. Mood & affect appropriate.   Data Reviewed: I have personally reviewed following labs and imaging studies  CBC: Recent Labs  Lab 02/07/17 1131 02/09/17 2014 02/10/17 0456  WBC 5.2 10.3 9.4  NEUTROABS 4.5 8.2*  --   HGB 8.0* 9.8* 9.8*  HCT 23.8* 29.3* 29.8*  MCV 105.1* 101.4* 101.7*  PLT 369 341 109   Basic Metabolic Panel: Recent Labs  Lab 02/07/17 1131 02/09/17 2014 02/10/17 0456  NA 139 139 137  K 4.2 3.7 4.1  CL 102 107 104  CO2 27 24 24   GLUCOSE 130 130* 161*  BUN 14 20 19   CREATININE 1.70* 1.40* 1.47*  CALCIUM 9.7 8.5* 9.1   GFR: Estimated  Creatinine Clearance: 43.8 mL/min (A) (by C-G formula based on SCr of 1.47 mg/dL (H)). Liver Function Tests: Recent Labs  Lab 02/07/17 1131 02/09/17 2014  AST 33 48*  ALT 28 25  ALKPHOS 160* 122  BILITOT 0.4 0.4  PROT 7.9 6.9  ALBUMIN 3.0* 2.9*   No results for input(s): LIPASE, AMYLASE in the last 168 hours. No results for input(s): AMMONIA in the last 168 hours. Coagulation Profile: No results for input(s): INR, PROTIME in the last 168 hours. Cardiac Enzymes: No results for input(s): CKTOTAL, CKMB, CKMBINDEX, TROPONINI in the last 168 hours. BNP (last 3 results) No results for input(s): PROBNP in the last  8760 hours. HbA1C: No results for input(s): HGBA1C in the last 72 hours. CBG: No results for input(s): GLUCAP in the last 168 hours. Lipid Profile: No results for input(s): CHOL, HDL, LDLCALC, TRIG, CHOLHDL, LDLDIRECT in the last 72 hours. Thyroid Function Tests: No results for input(s): TSH, T4TOTAL, FREET4, T3FREE, THYROIDAB in the last 72 hours. Anemia Panel: No results for input(s): VITAMINB12, FOLATE, FERRITIN, TIBC, IRON, RETICCTPCT in the last 72 hours. Urine analysis:    Component Value Date/Time   COLORURINE YELLOW 12/30/2016 2049   APPEARANCEUR CLEAR 12/30/2016 2049   LABSPEC 1.005 12/30/2016 2049   PHURINE 7.0 12/30/2016 2049   GLUCOSEU NEGATIVE 12/30/2016 2049   HGBUR NEGATIVE 12/30/2016 2049   BILIRUBINUR NEGATIVE 12/30/2016 2049   KETONESUR NEGATIVE 12/30/2016 2049   PROTEINUR NEGATIVE 12/30/2016 2049   UROBILINOGEN 0.2 05/29/2011 0122   NITRITE NEGATIVE 12/30/2016 2049   LEUKOCYTESUR NEGATIVE 12/30/2016 2049   Sepsis Labs: @LABRCNTIP (procalcitonin:4,lacticidven:4)   ) Recent Results (from the past 240 hour(s))  MRSA PCR Screening     Status: None   Collection Time: 02/10/17  1:45 AM  Result Value Ref Range Status   MRSA by PCR NEGATIVE NEGATIVE Final    Comment:        The GeneXpert MRSA Assay (FDA approved for NASAL specimens only), is one component of a comprehensive MRSA colonization surveillance program. It is not intended to diagnose MRSA infection nor to guide or monitor treatment for MRSA infections.       Radiology Studies: Dg Chest 2 View  Result Date: 02/09/2017 CLINICAL DATA:  Shortness of breath history of lung cancer EXAM: CHEST  2 VIEW COMPARISON:  01/07/2017, 01/03/2017, CT chest 12/31/2016 FINDINGS: Right-sided central venous port tip overlies the proximal right atrium.stable appearance of spiculated left upper lobe opacity. Development of focal airspace disease at the lateral left lung base and adjacent tiny pleural effusion.  Stable cardiomediastinal silhouette. No pneumothorax. IMPRESSION: 1. Interim development of mild focal airspace disease at the left lung base with tiny left pleural effusion, possible pneumonia 2. Otherwise no significant interval change Electronically Signed   By: Donavan Foil M.D.   On: 02/09/2017 21:55   Dg Abdomen 1 View  Result Date: 02/09/2017 CLINICAL DATA:  Severe abdominal pain EXAM: ABDOMEN - 1 VIEW COMPARISON:  12/23/2016 radiograph FINDINGS: Nonobstructed bowel-gas pattern. Coarse calcification in the left pelvis. Metallic density in the low pelvis. IMPRESSION: Nonobstructed gas pattern Electronically Signed   By: Donavan Foil M.D.   On: 02/09/2017 21:56        Scheduled Meds: . amLODipine  10 mg Oral Daily  . arformoterol  15 mcg Nebulization BID  . budesonide (PULMICORT) nebulizer solution  0.5 mg Nebulization BID  . enoxaparin (LOVENOX) injection  40 mg Subcutaneous QHS  . guaiFENesin  600 mg Oral BID  . ipratropium-albuterol  3  mL Nebulization TID  . loratadine  10 mg Oral Daily  . mouth rinse  15 mL Mouth Rinse BID  . methylPREDNISolone (SOLU-MEDROL) injection  60 mg Intravenous Q8H  . pantoprazole  40 mg Oral BID   Continuous Infusions: . ceFEPime (MAXIPIME) IV    . vancomycin       LOS: 1 day    Time spent: 25 minutes  Greater than 50% of the time spent on counseling and coordinating the care.   Leisa Lenz, MD [facility] Pager [phone]  If 7PM-7AM, please contact night-coverage [email or web address] Password [facility] 02/10/2017, 2:33 PM

## 2017-02-10 NOTE — Progress Notes (Signed)
Pharmacy Antibiotic Note  [name] is a 56 y.o. male admitted on 02/09/2017 with pneumonia.  Pharmacy has been consulted for Vancomycin dosing.  Plan: Vancomycin 1gm iv x1, then 750mg  iv q24hr  Goal AUC = 400 - 500 for all indications, except meningitis (goal AUC > 500 and Cmin 15-20 mcg/mL)  Zosyn 3.375gm iv x1, then Cefepime 1gm iv q8hr    Height: 5\' 8"  (172.7 cm) Weight: 123 lb 0.3 oz (55.8 kg) IBW/kg (Calculated) : 68.4  Temp (24hrs), Avg:97.6 F (36.4 C), Min:97.5 F (36.4 C), Max:97.7 F (36.5 C)  Recent Labs  Lab 02/07/17 1131 02/09/17 2014  WBC 5.2 10.3  CREATININE 1.70* 1.40*    Estimated Creatinine Clearance: 45.9 mL/min (A) (by C-G formula based on SCr of 1.4 mg/dL (H)).    No Known Allergies  Antimicrobials this admission: Vancomycin 02/09/2017 >> Zosyn 02/09/2017 >> Cefepime 02/10/2017 >>  Dose adjustments this admission: -   Microbiology results: pending  Thank you for allowing pharmacy to be a part of this patient's care.  [name] 02/10/2017 5:35 AM

## 2017-02-10 NOTE — Progress Notes (Signed)
Pharmacy: Re- vancomycin and cefepime  Patient's a 57 y.o M with hx lung cancer currently undergoing chemotherapy, presented to the ED on 02/09/17 with c/p SOB.  Vancomycin and cefepime started on admission for PNA.  - scr up 1.47 (crcl~44) - MRSA PCR neg  Plan: - with MRSA PCR neg, consider d/c vancomycin - adjust cefepime to 2gm IV q12h for renal function and indication  Dia Sitter, PharmD, BCPS 02/10/2017 10:11 AM

## 2017-02-10 NOTE — Clinical Social Work Note (Signed)
Clinical Social Work Assessment  Patient Details  Name: [name] MRN: [id number] Date of Birth: [birth year]  Date of referral:  02/10/17               Reason for consult:  Facility Placement                Permission sought to share information with:  Chartered certified accountant granted to share information::  Yes, Verbal Permission Granted  Name::        Agency::  [facility]  Relationship::     Contact Information:     Housing/Transportation Living arrangements for the past 2 months:  [place] of Information:  Patient Patient Interpreter Needed:  None Criminal Activity/Legal Involvement Pertinent to Current Situation/Hospitalization:  No - Comment as needed Significant Relationships:  Other Family Members Lives with:  Facility Resident Do you feel safe going back to the place where you live?  Yes Need for family participation in patient care:  No (Coment)  Care giving concerns:  Patient from [place] ([name] term care resident). Patient reported that he called EMS after having difficulty with his traveling oxygen tank. CSW inquired if patient needed some education on working tank. Patient reported that he was aware of how to work the tank and that he didn't need education.    Social Worker assessment / plan:  CSW spoke with patient at bedside regarding discharge planning. Patient verbalized plan to dc back to [name]. Patient reported that his [name] term goal is to move out and get an apartment, so he can have a portion of his check left over. Patient reported that he will return to [name] and work on getting his stuff together to move out eventually. CSW agreed to contact [name] and provide update.   CSW contacted [name] to confirm patient's ability to return, no answer. CSW left voicemail requesting return phone call.   CSW will continue to follow and assist with discharge planning.    Employment status:  Unemployed Forensic scientist:  Medicaid [place] PT Recommendations:  Not assessed at this time Information / Referral to community resources:  Other (Comment Required)(Patient from ALF - [name] term care)  Patient/Family's Response to care:  Patient appreciative of CSW assistance with discharge planning.  Patient/Family's Understanding of and Emotional Response to Diagnosis, Current Treatment, and Prognosis:  Patient initially presented guarded and blunt. Patient verbalized understanding of diagnosis and current treatment. After speaking with patient for awhile patient become more open, noting that he is always sick. CSW acknowledged patient's situation and feelings surrounding it. Patient reported that his [name] term goal is to get an apartment and live independently. CSW positively affirmed patient's goal. Patient verbalized plan to dc back to ALF and work on his goal from there.   Emotional Assessment Appearance:    Attitude/Demeanor/Rapport:  Other(Open) Affect (typically observed):  Guarded, Blunt Orientation:  Oriented to Self, Oriented to Place, Oriented to  Time, Oriented to Situation Alcohol / Substance use:  Not Applicable Psych involvement (Current and /or in the community):  No (Comment)  Discharge Needs  Concerns to be addressed:  Care Coordination Readmission within the last 30 days:  No Current discharge risk:  None Barriers to Discharge:  Continued Medical Work up   [employer], LCSW 02/10/2017, 4:11 PM

## 2017-02-11 ENCOUNTER — Encounter (HOSPITAL_COMMUNITY): Payer: Self-pay

## 2017-02-11 ENCOUNTER — Telehealth: Payer: Self-pay | Admitting: Internal Medicine

## 2017-02-11 ENCOUNTER — Other Ambulatory Visit: Payer: Self-pay

## 2017-02-11 LAB — BASIC METABOLIC PANEL
ANION GAP: 7 (ref 5–15)
BUN: 21 mg/dL — ABNORMAL HIGH (ref 6–20)
CALCIUM: 9.3 mg/dL (ref 8.9–10.3)
CO2: 24 mmol/L (ref 22–32)
CREATININE: 1.61 mg/dL — AB (ref 0.61–1.24)
Chloride: 106 mmol/L (ref 101–111)
GFR calc Af Amer: 53 mL/min — ABNORMAL LOW (ref >60)
GFR, EST NON AFRICAN AMERICAN: 46 mL/min — AB (ref >60)
GLUCOSE: 141 mg/dL — AB (ref 65–99)
Potassium: 4.6 mmol/L (ref 3.5–5.1)
Sodium: 137 mmol/L (ref 135–145)

## 2017-02-11 LAB — CBC
HEMATOCRIT: 28.1 % — AB (ref 39.0–52.0)
Hemoglobin: 9.1 g/dL — ABNORMAL LOW (ref 13.0–17.0)
MCH: 33.2 pg (ref 26.0–34.0)
MCHC: 32.4 g/dL (ref 30.0–36.0)
MCV: 102.6 fL — AB (ref 78.0–100.0)
PLATELETS: 331 10*3/uL (ref 150–400)
RBC: 2.74 MIL/uL — ABNORMAL LOW (ref 4.22–5.81)
RDW: 17.7 % — ABNORMAL HIGH (ref 11.5–15.5)
WBC: 13.7 10*3/uL — AB (ref 4.0–10.5)

## 2017-02-11 LAB — LEGIONELLA PNEUMOPHILA SEROGP 1 UR AG: L. pneumophila Serogp 1 Ur Ag: NEGATIVE

## 2017-02-11 NOTE — Progress Notes (Signed)
Patient ID: [name], male   DOB: [birth year], 57 y.o.   MRN: [id number]  PROGRESS NOTE    YASSIN SCALES  DGL:[id number] DOB: [birth year] DOA: 02/09/2017  PCP: Patient, No Pcp Per   Brief Narrative:  57 y.o. male with medical history significant of history of stage IV NSCLC undergoing chemotherapy, HTN, diastolic CHF underlying emphysema/COPD, intermittently on 2 L of oxygen, seizure disorder, and gastritis who presented with complaints of worsening shortness of breath and productive cough over the last 3 days. Abdominal x-ray showed no clear signs of obstruction. CXR showed focal airspace opacity in the left lung base with tiny pleural effusion suggestive of pneumonia. Pt started on vanco and cefepime.   Assessment & Plan:   Principal Problem: HCAP (healthcare-associated pneumonia)  - Continue vanco and cefepime  - Blood cultures pending - Legionella negative  - Strep pneumonia negative   Active Problems: Acute respiratory failure with hypoxia / Acute COPD exacerbation  - Continue Duoneb TID scheduled - Continue Duoneb every 4 hours PRN shortness of breath or wheezing  - Continue Pulmicort and brovana BID - Continue solumedrol 60 mg IV Q 8 hours  - Continue oxygen support via [place] to keep O2 saturation above 90%  Adenocarcinoma of left lung, stage 4 (HCC) - Management per oncology  CKD stage 3 - Baseline Cr in 12/2016 was 1.8 - Cr is 1.6 this am, within baseline range   Anemia of CKD - Hgb stable    DVT prophylaxis: Lovenox subQ Code Status: DNR/DNI Family Communication: no family at bedside this am Disposition Plan: home once resp status stable    Consultants:   None  Procedures:   None  Antimicrobials:   Vanco and cefepime 1/20 -->   Subjective: No overnight events.  Objective: Vitals:   02/11/17 0505 02/11/17 0827 02/11/17 0836 02/11/17 0843  BP: 127/74     Pulse: (!) 54     Resp: 18     Temp: 97.8 F (36.6 C)     TempSrc: Oral     SpO2:  100% 98% 98% 98%  Weight:      Height:        Intake/Output Summary (Last 24 hours) at 02/11/2017 1001 Last data filed at 02/11/2017 0600 Gross per 24 hour  Intake 970 ml  Output 2300 ml  Net -1330 ml   Filed Weights   02/10/17 0127  Weight: 55.8 kg (123 lb 0.3 oz)    Physical Exam  Constitutional: Appears well-developed and well-nourished. No distress.  CVS: RRR, S1/S2 +, no murmurs, no gallops, no carotid bruit.  Pulmonary: wheezing and rhonchi appreciated  Abdominal: Soft. BS +,  no distension, tenderness, rebound or guarding.  Musculoskeletal: Normal range of motion. No edema and no tenderness.  Lymphadenopathy: No lymphadenopathy noted, cervical, inguinal. Neuro: Alert. Normal reflexes, muscle tone coordination. No cranial nerve deficit. Skin: Skin is warm and dry.  Psychiatric: Normal mood and affect. Behavior, judgment, thought content normal.    Data Reviewed: I have personally reviewed following labs and imaging studies  CBC: Recent Labs  Lab 02/07/17 1131 02/09/17 2014 02/10/17 0456 02/11/17 0438  WBC 5.2 10.3 9.4 13.7*  NEUTROABS 4.5 8.2*  --   --   HGB 8.0* 9.8* 9.8* 9.1*  HCT 23.8* 29.3* 29.8* 28.1*  MCV 105.1* 101.4* 101.7* 102.6*  PLT 369 341 379 518   Basic Metabolic Panel: Recent Labs  Lab 02/07/17 1131 02/09/17 2014 02/10/17 0456 02/11/17 0438  NA 139 139 137 137  K  4.2 3.7 4.1 4.6  CL 102 107 104 106  CO2 27 24 24 24   GLUCOSE 130 130* 161* 141*  BUN 14 20 19  21*  CREATININE 1.70* 1.40* 1.47* 1.61*  CALCIUM 9.7 8.5* 9.1 9.3   GFR: Estimated Creatinine Clearance: 40 mL/min (A) (by C-G formula based on SCr of 1.61 mg/dL (H)). Liver Function Tests: Recent Labs  Lab 02/07/17 1131 02/09/17 2014  AST 33 48*  ALT 28 25  ALKPHOS 160* 122  BILITOT 0.4 0.4  PROT 7.9 6.9  ALBUMIN 3.0* 2.9*   No results for input(s): LIPASE, AMYLASE in the last 168 hours. No results for input(s): AMMONIA in the last 168 hours. Coagulation Profile: No  results for input(s): INR, PROTIME in the last 168 hours. Cardiac Enzymes: No results for input(s): CKTOTAL, CKMB, CKMBINDEX, TROPONINI in the last 168 hours. BNP (last 3 results) No results for input(s): PROBNP in the last 8760 hours. HbA1C: No results for input(s): HGBA1C in the last 72 hours. CBG: No results for input(s): GLUCAP in the last 168 hours. Lipid Profile: No results for input(s): CHOL, HDL, LDLCALC, TRIG, CHOLHDL, LDLDIRECT in the last 72 hours. Thyroid Function Tests: No results for input(s): TSH, T4TOTAL, FREET4, T3FREE, THYROIDAB in the last 72 hours. Anemia Panel: No results for input(s): VITAMINB12, FOLATE, FERRITIN, TIBC, IRON, RETICCTPCT in the last 72 hours. Urine analysis:    Component Value Date/Time   COLORURINE YELLOW 12/30/2016 2049   APPEARANCEUR CLEAR 12/30/2016 2049   LABSPEC 1.005 12/30/2016 2049   PHURINE 7.0 12/30/2016 2049   GLUCOSEU NEGATIVE 12/30/2016 2049   HGBUR NEGATIVE 12/30/2016 2049   BILIRUBINUR NEGATIVE 12/30/2016 2049   KETONESUR NEGATIVE 12/30/2016 2049   PROTEINUR NEGATIVE 12/30/2016 2049   UROBILINOGEN 0.2 05/29/2011 0122   NITRITE NEGATIVE 12/30/2016 2049   LEUKOCYTESUR NEGATIVE 12/30/2016 2049   Sepsis Labs: @LABRCNTIP (procalcitonin:4,lacticidven:4)   ) Recent Results (from the past 240 hour(s))  MRSA PCR Screening     Status: None   Collection Time: 02/10/17  1:45 AM  Result Value Ref Range Status   MRSA by PCR NEGATIVE NEGATIVE Final    Comment:        The GeneXpert MRSA Assay (FDA approved for NASAL specimens only), is one component of a comprehensive MRSA colonization surveillance program. It is not intended to diagnose MRSA infection nor to guide or monitor treatment for MRSA infections.       Radiology Studies: Dg Chest 2 View  Result Date: 02/09/2017 CLINICAL DATA:  Shortness of breath history of lung cancer EXAM: CHEST  2 VIEW COMPARISON:  01/07/2017, 01/03/2017, CT chest 12/31/2016 FINDINGS:  Right-sided central venous port tip overlies the proximal right atrium.stable appearance of spiculated left upper lobe opacity. Development of focal airspace disease at the lateral left lung base and adjacent tiny pleural effusion. Stable cardiomediastinal silhouette. No pneumothorax. IMPRESSION: 1. Interim development of mild focal airspace disease at the left lung base with tiny left pleural effusion, possible pneumonia 2. Otherwise no significant interval change Electronically Signed   By: Donavan Foil M.D.   On: 02/09/2017 21:55   Dg Abdomen 1 View  Result Date: 02/09/2017 CLINICAL DATA:  Severe abdominal pain EXAM: ABDOMEN - 1 VIEW COMPARISON:  12/23/2016 radiograph FINDINGS: Nonobstructed bowel-gas pattern. Coarse calcification in the left pelvis. Metallic density in the low pelvis. IMPRESSION: Nonobstructed gas pattern Electronically Signed   By: Donavan Foil M.D.   On: 02/09/2017 21:56        Scheduled Meds: . amLODipine  10 mg  Oral Daily  . arformoterol  15 mcg Nebulization BID  . budesonide (PULMICORT) nebulizer solution  0.5 mg Nebulization BID  . enoxaparin (LOVENOX) injection  40 mg Subcutaneous QHS  . guaiFENesin  600 mg Oral BID  . ipratropium-albuterol  3 mL Nebulization TID  . loratadine  10 mg Oral Daily  . mouth rinse  15 mL Mouth Rinse BID  . methylPREDNISolone (SOLU-MEDROL) injection  60 mg Intravenous Q8H  . pantoprazole  40 mg Oral BID   Continuous Infusions: . ceFEPime (MAXIPIME) IV Stopped (02/11/17 0207)     LOS: 2 days    Time spent: 25 minutes  Greater than 50% of the time spent on counseling and coordinating the care.   Leisa Lenz, MD [facility] Pager [phone]  If 7PM-7AM, please contact night-coverage [email or web address] Password [facility] 02/11/2017, 10:01 AM

## 2017-02-11 NOTE — Progress Notes (Signed)
Medications administered by student RN [phone] with supervision of Clinical Instructor Lynann Bologna MSN, RN-BC or patient's assigned RN.

## 2017-02-11 NOTE — Telephone Encounter (Signed)
Left message re January/February. Schedule mailed.  Per [place] due to patient in hospital this week push appointments requested per 1/18 los to next week (1/31) lab/chemo and then 3 weeks later lab/fu/chemo. Weekly labs cancelled per 1/18 los.

## 2017-02-12 ENCOUNTER — Encounter (HOSPITAL_COMMUNITY): Payer: Self-pay

## 2017-02-12 LAB — CBC
HCT: 28.7 % — ABNORMAL LOW (ref 39.0–52.0)
HEMOGLOBIN: 9.4 g/dL — AB (ref 13.0–17.0)
MCH: 33.9 pg (ref 26.0–34.0)
MCHC: 32.8 g/dL (ref 30.0–36.0)
MCV: 103.6 fL — ABNORMAL HIGH (ref 78.0–100.0)
PLATELETS: 365 10*3/uL (ref 150–400)
RBC: 2.77 MIL/uL — ABNORMAL LOW (ref 4.22–5.81)
RDW: 17.9 % — ABNORMAL HIGH (ref 11.5–15.5)
WBC: 17.8 10*3/uL — ABNORMAL HIGH (ref 4.0–10.5)

## 2017-02-12 LAB — BASIC METABOLIC PANEL
Anion gap: 8 (ref 5–15)
BUN: 29 mg/dL — AB (ref 6–20)
CO2: 25 mmol/L (ref 22–32)
CREATININE: 1.9 mg/dL — AB (ref 0.61–1.24)
Calcium: 9.4 mg/dL (ref 8.9–10.3)
Chloride: 103 mmol/L (ref 101–111)
GFR, EST AFRICAN AMERICAN: 44 mL/min — AB (ref >60)
GFR, EST NON AFRICAN AMERICAN: 38 mL/min — AB (ref >60)
Glucose, Bld: 145 mg/dL — ABNORMAL HIGH (ref 65–99)
Potassium: 4.1 mmol/L (ref 3.5–5.1)
SODIUM: 136 mmol/L (ref 135–145)

## 2017-02-12 NOTE — Progress Notes (Addendum)
Patient ID: [name], male   DOB: October 27, 1960, 57 y.o.   MRN: [id number]  PROGRESS NOTE    [name]  [id number] DOB: 02-27-1960 DOA: 02/09/2017  PCP: Patient, No Pcp Per   Brief Narrative:  57 y.o. male with medical history significant of history of stage IV NSCLC undergoing chemotherapy, HTN, diastolic CHF underlying emphysema/COPD, intermittently on 2 L of oxygen, seizure disorder, and gastritis who presented with complaints of worsening shortness of breath and productive cough over the last 3 days. Abdominal x-ray showed no clear signs of obstruction. CXR showed focal airspace opacity in the left lung base with tiny pleural effusion suggestive of pneumonia. Pt started on vanco and cefepime.   Assessment & Plan:   Principal Problem: HCAP (healthcare-associated pneumonia)  - Continue cefepime, may stop vanco - Blood cx so far show no growth - Legionella negative  - Strep pneumonia negative   Active Problems: Acute respiratory failure with hypoxia / Acute COPD exacerbation  - Continue Duoneb TID scheduled - Continue Duoneb every 4 hours PRN shortness of breath or wheezing  - Continue Pulmicort and brovana BID - Continue solumedrol 60 mg IV Q 8 hours  - Pt still wheezing this am - Continue oxygen support via [place] to keep O2 saturation above 90%  Adenocarcinoma of left lung, stage 4 (HCC) - Management per oncology  CKD stage 3 - Baseline Cr in 12/2016 was 1.8 - Ct 1.9 this am, possibly from vanco which will be stopped today - Monitor daily BMP  Anemia of CKD - Hgb stable  Essential hypertension - Continue Norvasc    DVT prophylaxis: SCD's Code Status: DNR/DNI Family Communication: no family at the bedside this am Disposition Plan: not yet stable for discharge, home once resp status improves    Consultants:   None  Procedures:   None  Antimicrobials:   Vanco 1/20 --> 1/23  Cefepime 1/20 -->  Subjective: Wheezing this am.  Objective: Vitals:    02/11/17 2021 02/11/17 2329 02/12/17 0422 02/12/17 0915  BP: 137/77  (!) 149/92   Pulse: 65  72 (!) 51  Resp: 20  18 18   Temp: 97.9 F (36.6 C)  97.8 F (36.6 C)   TempSrc: Oral  Oral   SpO2: 100% 100% 100% 99%  Weight:      Height:        Intake/Output Summary (Last 24 hours) at 02/12/2017 1105 Last data filed at 02/11/2017 1456 Gross per 24 hour  Intake 50 ml  Output -  Net 50 ml   Filed Weights   02/10/17 0127  Weight: 55.8 kg (123 lb 0.3 oz)    Physical Exam  Constitutional: Appears well-developed and well-nourished. No distress.  CVS: RRR, S1/S2 + Pulmonary: wheezing in upper and mid lung lobes Abdominal: Soft. BS +,  no distension, tenderness, rebound or guarding.  Musculoskeletal: Normal range of motion. No edema and no tenderness.  Neuro: Alert. Normal reflexes, muscle tone coordination. No cranial nerve deficit. Skin: Skin is warm and dry. No rash noted. Not diaphoretic. No erythema. No pallor.  Psychiatric: Normal mood and affect. Behavior, judgment, thought content normal.    Data Reviewed: I have personally reviewed following labs and imaging studies  CBC: Recent Labs  Lab 02/07/17 1131 02/09/17 2014 02/10/17 0456 02/11/17 0438 02/12/17 0418  WBC 5.2 10.3 9.4 13.7* 17.8*  NEUTROABS 4.5 8.2*  --   --   --   HGB 8.0* 9.8* 9.8* 9.1* 9.4*  HCT 23.8* 29.3* 29.8* 28.1*  28.7*  MCV 105.1* 101.4* 101.7* 102.6* 103.6*  PLT 369 341 379 331 295   Basic Metabolic Panel: Recent Labs  Lab 02/07/17 1131 02/09/17 2014 02/10/17 0456 02/11/17 0438 02/12/17 0418  NA 139 139 137 137 136  K 4.2 3.7 4.1 4.6 4.1  CL 102 107 104 106 103  CO2 27 24 24 24 25   GLUCOSE 130 130* 161* 141* 145*  BUN 14 20 19  21* 29*  CREATININE 1.70* 1.40* 1.47* 1.61* 1.90*  CALCIUM 9.7 8.5* 9.1 9.3 9.4   GFR: Estimated Creatinine Clearance: 33.9 mL/min (A) (by C-G formula based on SCr of 1.9 mg/dL (H)). Liver Function Tests: Recent Labs  Lab 02/07/17 1131 02/09/17 2014  AST  33 48*  ALT 28 25  ALKPHOS 160* 122  BILITOT 0.4 0.4  PROT 7.9 6.9  ALBUMIN 3.0* 2.9*   No results for input(s): LIPASE, AMYLASE in the last 168 hours. No results for input(s): AMMONIA in the last 168 hours. Coagulation Profile: No results for input(s): INR, PROTIME in the last 168 hours. Cardiac Enzymes: No results for input(s): CKTOTAL, CKMB, CKMBINDEX, TROPONINI in the last 168 hours. BNP (last 3 results) No results for input(s): PROBNP in the last 8760 hours. HbA1C: No results for input(s): HGBA1C in the last 72 hours. CBG: No results for input(s): GLUCAP in the last 168 hours. Lipid Profile: No results for input(s): CHOL, HDL, LDLCALC, TRIG, CHOLHDL, LDLDIRECT in the last 72 hours. Thyroid Function Tests: No results for input(s): TSH, T4TOTAL, FREET4, T3FREE, THYROIDAB in the last 72 hours. Anemia Panel: No results for input(s): VITAMINB12, FOLATE, FERRITIN, TIBC, IRON, RETICCTPCT in the last 72 hours. Urine analysis:    Component Value Date/Time   COLORURINE YELLOW 12/30/2016 2049   APPEARANCEUR CLEAR 12/30/2016 2049   LABSPEC 1.005 12/30/2016 2049   PHURINE 7.0 12/30/2016 2049   GLUCOSEU NEGATIVE 12/30/2016 2049   HGBUR NEGATIVE 12/30/2016 2049   [place] 12/30/2016 2049   KETONESUR NEGATIVE 12/30/2016 2049   PROTEINUR NEGATIVE 12/30/2016 2049   UROBILINOGEN 0.2 05/29/2011 0122   NITRITE NEGATIVE 12/30/2016 2049   LEUKOCYTESUR NEGATIVE 12/30/2016 2049   Sepsis Labs: @LABRCNTIP (procalcitonin:4,lacticidven:4)   ) Recent Results (from the past 240 hour(s))  Blood culture (routine x 2)     Status: None (Preliminary result)   Collection Time: 02/09/17 10:45 PM  Result Value Ref Range Status   Specimen Description BLOOD RIGHT ANTECUBITAL  Final   Special Requests   Final    BOTTLES DRAWN AEROBIC AND ANAEROBIC Blood Culture results may not be optimal due to an inadequate volume of blood received in culture bottles   Culture   Final    NO GROWTH 2  DAYS Performed at [facility], [place] [address] [place], [place] [address]    Report Status PENDING  Incomplete  Blood culture (routine x 2)     Status: None (Preliminary result)   Collection Time: 02/09/17 10:45 PM  Result Value Ref Range Status   Specimen Description BLOOD LEFT ARM  Final   Special Requests   Final    BOTTLES DRAWN AEROBIC AND ANAEROBIC Blood Culture results may not be optimal due to an inadequate volume of blood received in culture bottles   Culture   Final    NO GROWTH 2 DAYS Performed at [facility], [place] [address] [place], [place] [address]    Report Status PENDING  Incomplete  MRSA PCR Screening     Status: None   Collection Time: 02/10/17  1:45 AM  Result Value Ref Range Status   MRSA by PCR NEGATIVE NEGATIVE Final    Comment:        The GeneXpert MRSA Assay (FDA approved for NASAL specimens only), is one component of a comprehensive MRSA colonization surveillance program. It is not intended to diagnose MRSA infection nor to guide or monitor treatment for MRSA infections.       Radiology Studies: Dg Chest 2 View  Result Date: 02/09/2017 CLINICAL DATA:  Shortness of breath history of lung cancer EXAM: CHEST  2 VIEW COMPARISON:  01/07/2017, 01/03/2017, CT chest 12/31/2016 FINDINGS: Right-sided central venous port tip overlies the proximal right atrium.stable appearance of spiculated left upper lobe opacity. Development of focal airspace disease at the lateral left lung base and adjacent tiny pleural effusion. Stable cardiomediastinal silhouette. No pneumothorax. IMPRESSION: 1. Interim development of mild focal airspace disease at the left lung base with tiny left pleural effusion, possible pneumonia 2. Otherwise no significant interval change Electronically Signed   By: Donavan Foil M.D.   On: 02/09/2017 21:55   Dg Abdomen 1 View  Result Date: 02/09/2017 CLINICAL DATA:  Severe abdominal pain EXAM: ABDOMEN - 1 VIEW COMPARISON:  12/23/2016  radiograph FINDINGS: Nonobstructed bowel-gas pattern. Coarse calcification in the left pelvis. Metallic density in the low pelvis. IMPRESSION: Nonobstructed gas pattern Electronically Signed   By: Donavan Foil M.D.   On: 02/09/2017 21:56        Scheduled Meds: . amLODipine  10 mg Oral Daily  . arformoterol  15 mcg Nebulization BID  . budesonide (PULMICORT) nebulizer solution  0.5 mg Nebulization BID  . guaiFENesin  600 mg Oral BID  . ipratropium-albuterol  3 mL Nebulization TID  . loratadine  10 mg Oral Daily  . mouth rinse  15 mL Mouth Rinse BID  . methylPREDNISolone (SOLU-MEDROL) injection  60 mg Intravenous Q8H  . pantoprazole  40 mg Oral BID   Continuous Infusions: . ceFEPime (MAXIPIME) IV Stopped (02/12/17 0220)     LOS: 3 days    Time spent: 25 minutes  Greater than 50% of the time spent on counseling and coordinating the care.   Leisa Lenz, MD [facility] Pager [phone]  If 7PM-7AM, please contact night-coverage [email or web address] Password [facility] 02/12/2017, 11:05 AM

## 2017-02-13 ENCOUNTER — Inpatient Hospital Stay (HOSPITAL_COMMUNITY): Payer: Medicaid Other

## 2017-02-13 ENCOUNTER — Encounter (HOSPITAL_COMMUNITY): Payer: Self-pay

## 2017-02-13 DIAGNOSIS — J189 Pneumonia, unspecified organism: Secondary | ICD-10-CM

## 2017-02-13 LAB — BASIC METABOLIC PANEL
Anion gap: 7 (ref 5–15)
BUN: 38 mg/dL — ABNORMAL HIGH (ref 6–20)
CALCIUM: 9.4 mg/dL (ref 8.9–10.3)
CO2: 26 mmol/L (ref 22–32)
CREATININE: 1.6 mg/dL — AB (ref 0.61–1.24)
Chloride: 104 mmol/L (ref 101–111)
GFR calc Af Amer: 54 mL/min — ABNORMAL LOW (ref >60)
GFR calc non Af Amer: 46 mL/min — ABNORMAL LOW (ref >60)
Glucose, Bld: 155 mg/dL — ABNORMAL HIGH (ref 65–99)
Potassium: 4.2 mmol/L (ref 3.5–5.1)
Sodium: 137 mmol/L (ref 135–145)

## 2017-02-13 LAB — CBC
HEMATOCRIT: 28.1 % — AB (ref 39.0–52.0)
Hemoglobin: 9.3 g/dL — ABNORMAL LOW (ref 13.0–17.0)
MCH: 34.1 pg — ABNORMAL HIGH (ref 26.0–34.0)
MCHC: 33.1 g/dL (ref 30.0–36.0)
MCV: 102.9 fL — AB (ref 78.0–100.0)
Platelets: 345 10*3/uL (ref 150–400)
RBC: 2.73 MIL/uL — ABNORMAL LOW (ref 4.22–5.81)
RDW: 18.4 % — AB (ref 11.5–15.5)
WBC: 20.1 10*3/uL — ABNORMAL HIGH (ref 4.0–10.5)

## 2017-02-13 MED ORDER — TUBERCULIN PPD 5 UNIT/0.1ML ID SOLN
5.0000 [IU] | Freq: Once | INTRADERMAL | Status: AC
  Administered 2017-02-13: 5 [IU] via INTRADERMAL
  Filled 2017-02-13: qty 0.1

## 2017-02-13 MED ORDER — ENOXAPARIN SODIUM 30 MG/0.3ML ~~LOC~~ SOLN
30.0000 mg | SUBCUTANEOUS | Status: DC
  Filled 2017-02-13: qty 0.3

## 2017-02-13 MED ORDER — BISOPROLOL FUMARATE 5 MG PO TABS
5.0000 mg | ORAL_TABLET | Freq: Every day | ORAL | Status: DC
  Administered 2017-02-14 – 2017-02-16 (×3): 5 mg via ORAL
  Filled 2017-02-13 (×3): qty 1

## 2017-02-13 MED ORDER — FUROSEMIDE 10 MG/ML IJ SOLN
40.0000 mg | Freq: Once | INTRAMUSCULAR | Status: AC
  Administered 2017-02-13: 40 mg via INTRAVENOUS
  Filled 2017-02-13: qty 4

## 2017-02-13 NOTE — Progress Notes (Signed)
TB injection given 1/24 @ 1443.  Test can be read on 1/26 @ 1443.

## 2017-02-13 NOTE — Progress Notes (Addendum)
Patient ID: [name], male   DOB: 08/24/1960, 57 y.o.   MRN: [id number]  PROGRESS NOTE    GILFORD LARDIZABAL  VOZ:[id number] DOB: [birth year] DOA: 02/09/2017  PCP: Patient, No Pcp Per   Brief Narrative:  57 y.o. male with medical history significant of history of stage IV NSCLC undergoing chemotherapy, HTN, diastolic CHF underlying emphysema/COPD, intermittently on 2 L of oxygen, seizure disorder, and gastritis who presented with complaints of worsening shortness of breath and productive cough over the last 3 days. Abdominal x-ray showed no clear signs of obstruction. CXR showed focal airspace opacity in the left lung base with tiny pleural effusion suggestive of pneumonia. Pt started on vanco and cefepime.   Assessment & Plan:     Acute respiratory failure with hypoxia / Acute COPD exacerbation ? HCAP - mrsa screening negative, Legionella negative ,Strep pneumonia negative ,Blood cx so far show no growth, no fever, he was started on vanco /zosyn then cefepime, he is getting day 5 of abx -persistent wheezing, non productive cough  - continue steroids/nebs/mucinex, he has bilateral ankle edema , will give trial dose of lasix, will get ct chest for further evaluation, low threshold to change to oral abx if ct chest reassuring  Leukocytosis No leukocytosis on presentation He developed leukocytosis after steroids Continue to monitor , taper steroids if less wheezing  Adenocarcinoma of left lung, stage 4 ([place])  -Diagnosed in 2/ 2018 -with spine metastasis completed  XRT in 05/2016 - on alimta maintanence therapy q3wks, last dose on 12/28, Management per oncology  Diastolic chf: Signs of volume overload? Trial dose of lasix on 1/24  CKD stage 3 - Baseline Cr in 12/2016 was 1.8 - Ct 1.9 this am, possibly from vanco which will be stopped today - Monitor daily BMP  Anemia of CKD, anemia in the setting of malignancy - he received prbcx2units in oncology office on 1/18 - Hgb  stable  Essential hypertension - d/c Norvasc due to ankle edema -start low dose cardioselective betablocker bisoprolol   DVT prophylaxis: lovenox  Code Status: DNR/DNI Family Communication: no family at the bedside this am Disposition Plan: not yet stable for discharge, still significant wheezing,/sob, return to alf with home 02 once resp status improves    Consultants:   None  Procedures:   None  Antimicrobials:   Vanco 1/20 --> 1/23  Cefepime 1/20 -->  Subjective: Wheezing , nonproductive cough. DOE, + bilateral ankle edema,  He is on 2liter o2  No fever, denies chest pain  Objective: Vitals:   02/12/17 2038 02/13/17 0345 02/13/17 0844 02/13/17 0900  BP: 129/72 135/80 135/77   Pulse: 85 64 74   Resp: 18 18 18    Temp: 98.6 F (37 C) 97.7 F (36.5 C) 97.8 F (36.6 C)   TempSrc: Oral Oral Oral   SpO2: 99% 100% 97% 98%  Weight:      Height:        Intake/Output Summary (Last 24 hours) at 02/13/2017 1126 Last data filed at 02/13/2017 0800 Gross per 24 hour  Intake 1190 ml  Output -  Net 1190 ml   Filed Weights   02/10/17 0127  Weight: 55.8 kg (123 lb 0.3 oz)    Physical Exam  Constitutional: thin, DOE, + wheezing CVS: RRR, S1/S2 + Pulmonary: wheezing in upper and mid lung lobes Abdominal: Soft. BS +,  no distension, tenderness, rebound or guarding.  Musculoskeletal: Normal range of motion. Bilateral ankle pitting edema Neuro: Alert. Normal reflexes, muscle tone coordination.  No cranial nerve deficit. Skin: Skin is warm and dry. No rash noted. Not diaphoretic. No erythema. No pallor.  Psychiatric: Normal mood and affect. Behavior, judgment, thought content normal.    Data Reviewed: I have personally reviewed following labs and imaging studies  CBC: Recent Labs  Lab 02/07/17 1131 02/09/17 2014 02/10/17 0456 02/11/17 0438 02/12/17 0418 02/13/17 0406  WBC 5.2 10.3 9.4 13.7* 17.8* 20.1*  NEUTROABS 4.5 8.2*  --   --   --   --   HGB 8.0* 9.8*  9.8* 9.1* 9.4* 9.3*  HCT 23.8* 29.3* 29.8* 28.1* 28.7* 28.1*  MCV 105.1* 101.4* 101.7* 102.6* 103.6* 102.9*  PLT 369 341 379 331 365 157   Basic Metabolic Panel: Recent Labs  Lab 02/09/17 2014 02/10/17 0456 02/11/17 0438 02/12/17 0418 02/13/17 0406  NA 139 137 137 136 137  K 3.7 4.1 4.6 4.1 4.2  CL 107 104 106 103 104  CO2 24 24 24 25 26   GLUCOSE 130* 161* 141* 145* 155*  BUN 20 19 21* 29* 38*  CREATININE 1.40* 1.47* 1.61* 1.90* 1.60*  CALCIUM 8.5* 9.1 9.3 9.4 9.4   GFR: Estimated Creatinine Clearance: 40.2 mL/min (A) (by C-G formula based on SCr of 1.6 mg/dL (H)). Liver Function Tests: Recent Labs  Lab 02/07/17 1131 02/09/17 2014  AST 33 48*  ALT 28 25  ALKPHOS 160* 122  BILITOT 0.4 0.4  PROT 7.9 6.9  ALBUMIN 3.0* 2.9*   No results for input(s): LIPASE, AMYLASE in the last 168 hours. No results for input(s): AMMONIA in the last 168 hours. Coagulation Profile: No results for input(s): INR, PROTIME in the last 168 hours. Cardiac Enzymes: No results for input(s): CKTOTAL, CKMB, CKMBINDEX, TROPONINI in the last 168 hours. BNP (last 3 results) No results for input(s): PROBNP in the last 8760 hours. HbA1C: No results for input(s): HGBA1C in the last 72 hours. CBG: No results for input(s): GLUCAP in the last 168 hours. Lipid Profile: No results for input(s): CHOL, HDL, LDLCALC, TRIG, CHOLHDL, LDLDIRECT in the last 72 hours. Thyroid Function Tests: No results for input(s): TSH, T4TOTAL, FREET4, T3FREE, THYROIDAB in the last 72 hours. Anemia Panel: No results for input(s): VITAMINB12, FOLATE, FERRITIN, TIBC, IRON, RETICCTPCT in the last 72 hours. Urine analysis:    Component Value Date/Time   COLORURINE YELLOW 12/30/2016 2049   APPEARANCEUR CLEAR 12/30/2016 2049   LABSPEC 1.005 12/30/2016 2049   PHURINE 7.0 12/30/2016 2049   GLUCOSEU NEGATIVE 12/30/2016 2049   HGBUR NEGATIVE 12/30/2016 2049   [place] 12/30/2016 2049   KETONESUR NEGATIVE 12/30/2016  2049   PROTEINUR NEGATIVE 12/30/2016 2049   UROBILINOGEN 0.2 05/29/2011 0122   NITRITE NEGATIVE 12/30/2016 2049   LEUKOCYTESUR NEGATIVE 12/30/2016 2049   Sepsis Labs: @LABRCNTIP (procalcitonin:4,lacticidven:4)   ) Recent Results (from the past 240 hour(s))  Blood culture (routine x 2)     Status: None (Preliminary result)   Collection Time: 02/09/17 10:45 PM  Result Value Ref Range Status   Specimen Description BLOOD RIGHT ANTECUBITAL  Final   Special Requests   Final    BOTTLES DRAWN AEROBIC AND ANAEROBIC Blood Culture results may not be optimal due to an inadequate volume of blood received in culture bottles   Culture   Final    NO GROWTH 3 DAYS Performed at [facility], [place] [address], [place], [place] [address]    Report Status PENDING  Incomplete  Blood culture (routine x 2)     Status: None (Preliminary result)  Collection Time: 02/09/17 10:45 PM  Result Value Ref Range Status   Specimen Description BLOOD LEFT ARM  Final   Special Requests   Final    BOTTLES DRAWN AEROBIC AND ANAEROBIC Blood Culture results may not be optimal due to an inadequate volume of blood received in culture bottles   Culture   Final    NO GROWTH 3 DAYS Performed at [facility], [place] [address], [place], [place] [address]    Report Status PENDING  Incomplete  MRSA PCR Screening     Status: None   Collection Time: 02/10/17  1:45 AM  Result Value Ref Range Status   MRSA by PCR NEGATIVE NEGATIVE Final    Comment:        The GeneXpert MRSA Assay (FDA approved for NASAL specimens only), is one component of a comprehensive MRSA colonization surveillance program. It is not intended to diagnose MRSA infection nor to guide or monitor treatment for MRSA infections.       Radiology Studies: Dg Chest 2 View  Result Date: 02/09/2017 CLINICAL DATA:  Shortness of breath history of lung cancer EXAM: CHEST  2 VIEW COMPARISON:  01/07/2017, 01/03/2017, CT chest 12/31/2016 FINDINGS:  Right-sided central venous port tip overlies the proximal right atrium.stable appearance of spiculated left upper lobe opacity. Development of focal airspace disease at the lateral left lung base and adjacent tiny pleural effusion. Stable cardiomediastinal silhouette. No pneumothorax. IMPRESSION: 1. Interim development of mild focal airspace disease at the left lung base with tiny left pleural effusion, possible pneumonia 2. Otherwise no significant interval change Electronically Signed   By: Donavan Foil M.D.   On: 02/09/2017 21:55   Dg Abdomen 1 View  Result Date: 02/09/2017 CLINICAL DATA:  Severe abdominal pain EXAM: ABDOMEN - 1 VIEW COMPARISON:  12/23/2016 radiograph FINDINGS: Nonobstructed bowel-gas pattern. Coarse calcification in the left pelvis. Metallic density in the low pelvis. IMPRESSION: Nonobstructed gas pattern Electronically Signed   By: Donavan Foil M.D.   On: 02/09/2017 21:56        Scheduled Meds: . amLODipine  10 mg Oral Daily  . arformoterol  15 mcg Nebulization BID  . budesonide (PULMICORT) nebulizer solution  0.5 mg Nebulization BID  . furosemide  40 mg Intravenous Once  . guaiFENesin  600 mg Oral BID  . ipratropium-albuterol  3 mL Nebulization TID  . loratadine  10 mg Oral Daily  . mouth rinse  15 mL Mouth Rinse BID  . methylPREDNISolone (SOLU-MEDROL) injection  60 mg Intravenous Q8H  . pantoprazole  40 mg Oral BID   Continuous Infusions: . ceFEPime (MAXIPIME) IV Stopped (02/13/17 0131)     LOS: 4 days    Time spent: 35 minutes  Greater than 50% of the time spent on counseling and coordinating the care.   Florencia Reasons, MD PhD [facility] Pager [phone]  If 7PM-7AM, please contact night-coverage [email or web address] Password [facility] 02/13/2017, 11:26 AM

## 2017-02-13 NOTE — Progress Notes (Signed)
CSW following to assist with discharge planning. Patient from [place]. CSW contacted [name] and spoke with admissions staff member Zacarias Pontes. Staff confirmed patient's ability to return. Staff requested TB test, patient's attending MD notified. CSW will continue to follow and assist with discharge planning.  Abundio Miu, [place] Social Worker [facility] Cell#: [phone]

## 2017-02-14 ENCOUNTER — Other Ambulatory Visit: Payer: Medicaid Other

## 2017-02-14 ENCOUNTER — Encounter (HOSPITAL_COMMUNITY): Payer: Self-pay | Admitting: Nurse Practitioner

## 2017-02-14 ENCOUNTER — Inpatient Hospital Stay (HOSPITAL_COMMUNITY): Payer: Medicaid Other

## 2017-02-14 DIAGNOSIS — R609 Edema, unspecified: Secondary | ICD-10-CM

## 2017-02-14 LAB — GLUCOSE, PLEURAL OR PERITONEAL FLUID: Glucose, Fluid: 153 mg/dL

## 2017-02-14 LAB — CBC WITH DIFFERENTIAL/PLATELET
Basophils Absolute: 0 10*3/uL (ref 0.0–0.1)
Basophils Relative: 0 %
Eosinophils Absolute: 0 10*3/uL (ref 0.0–0.7)
Eosinophils Relative: 0 %
HEMATOCRIT: 28.6 % — AB (ref 39.0–52.0)
HEMOGLOBIN: 9.4 g/dL — AB (ref 13.0–17.0)
LYMPHS PCT: 6 %
Lymphs Abs: 1.3 10*3/uL (ref 0.7–4.0)
MCH: 33.9 pg (ref 26.0–34.0)
MCHC: 32.9 g/dL (ref 30.0–36.0)
MCV: 103.2 fL — ABNORMAL HIGH (ref 78.0–100.0)
MONOS PCT: 2 %
Monocytes Absolute: 0.4 10*3/uL (ref 0.1–1.0)
NEUTROS PCT: 92 %
Neutro Abs: 20.6 10*3/uL — ABNORMAL HIGH (ref 1.7–7.7)
Platelets: 319 10*3/uL (ref 150–400)
RBC: 2.77 MIL/uL — AB (ref 4.22–5.81)
RDW: 18.5 % — AB (ref 11.5–15.5)
WBC: 22.3 10*3/uL — AB (ref 4.0–10.5)

## 2017-02-14 LAB — COMPREHENSIVE METABOLIC PANEL
ALBUMIN: 3.1 g/dL — AB (ref 3.5–5.0)
ALT: 35 U/L (ref 17–63)
AST: 34 U/L (ref 15–41)
Alkaline Phosphatase: 142 U/L — ABNORMAL HIGH (ref 38–126)
Anion gap: 10 (ref 5–15)
BILIRUBIN TOTAL: 0.1 mg/dL — AB (ref 0.3–1.2)
BUN: 44 mg/dL — AB (ref 6–20)
CO2: 27 mmol/L (ref 22–32)
Calcium: 9.7 mg/dL (ref 8.9–10.3)
Chloride: 100 mmol/L — ABNORMAL LOW (ref 101–111)
Creatinine, Ser: 1.92 mg/dL — ABNORMAL HIGH (ref 0.61–1.24)
GFR calc Af Amer: 43 mL/min — ABNORMAL LOW (ref >60)
GFR calc non Af Amer: 37 mL/min — ABNORMAL LOW (ref >60)
GLUCOSE: 151 mg/dL — AB (ref 65–99)
POTASSIUM: 3.9 mmol/L (ref 3.5–5.1)
Sodium: 137 mmol/L (ref 135–145)
TOTAL PROTEIN: 7.1 g/dL (ref 6.5–8.1)

## 2017-02-14 LAB — BODY FLUID CELL COUNT WITH DIFFERENTIAL
LYMPHS FL: 86 %
Monocyte-Macrophage-Serous Fluid: 12 % — ABNORMAL LOW (ref 50–90)
Neutrophil Count, Fluid: 2 % (ref 0–25)
Total Nucleated Cell Count, Fluid: 622 cu mm (ref 0–1000)

## 2017-02-14 LAB — LACTATE DEHYDROGENASE, PLEURAL OR PERITONEAL FLUID: LD FL: 159 U/L — AB (ref 3–23)

## 2017-02-14 LAB — PROTEIN, PLEURAL OR PERITONEAL FLUID

## 2017-02-14 LAB — VITAMIN B12: Vitamin B-12: 769 pg/mL (ref 180–914)

## 2017-02-14 LAB — FOLATE: Folate: 6.4 ng/mL (ref >5.9)

## 2017-02-14 MED ORDER — ENOXAPARIN SODIUM 30 MG/0.3ML ~~LOC~~ SOLN
30.0000 mg | SUBCUTANEOUS | Status: DC
  Filled 2017-02-14: qty 0.3

## 2017-02-14 MED ORDER — METHYLPREDNISOLONE SODIUM SUCC 125 MG IJ SOLR
60.0000 mg | Freq: Two times a day (BID) | INTRAMUSCULAR | Status: DC
  Administered 2017-02-14 – 2017-02-15 (×2): 60 mg via INTRAVENOUS
  Filled 2017-02-14 (×2): qty 2

## 2017-02-14 MED ORDER — LIDOCAINE HCL 1 % IJ SOLN
INTRAMUSCULAR | Status: AC
  Filled 2017-02-14: qty 20

## 2017-02-14 MED ORDER — POLYETHYLENE GLYCOL 3350 17 G PO PACK
17.0000 g | PACK | Freq: Every day | ORAL | Status: DC
  Administered 2017-02-14 – 2017-02-15 (×2): 17 g via ORAL
  Filled 2017-02-14 (×2): qty 1

## 2017-02-14 MED ORDER — SENNOSIDES-DOCUSATE SODIUM 8.6-50 MG PO TABS
1.0000 | ORAL_TABLET | Freq: Two times a day (BID) | ORAL | Status: DC
  Administered 2017-02-14 – 2017-02-15 (×4): 1 via ORAL
  Filled 2017-02-14 (×4): qty 1

## 2017-02-14 MED ORDER — AMOXICILLIN-POT CLAVULANATE 500-125 MG PO TABS
500.0000 mg | ORAL_TABLET | Freq: Two times a day (BID) | ORAL | Status: DC
  Administered 2017-02-14 – 2017-02-16 (×5): 500 mg via ORAL
  Filled 2017-02-14 (×5): qty 1

## 2017-02-14 NOTE — Progress Notes (Signed)
Bilateral lower extremity venous duplex has been completed. Negative for DVT.  02/14/17 2:05 PM Carlos Levering RVT

## 2017-02-14 NOTE — Progress Notes (Signed)
Patient ID: [name], male   DOB: [birth year], 57 y.o.   MRN: [id number]  PROGRESS NOTE    [name]  RCV:[id number] DOB: May 09, 1960 DOA: 02/09/2017  PCP: Patient, No Pcp Per   Brief Narrative:  57 y.o. male with medical history significant of history of stage IV NSCLC undergoing chemotherapy, HTN, diastolic CHF underlying emphysema/COPD, intermittently on 2 L of oxygen, seizure disorder, and gastritis who presented with complaints of worsening shortness of breath and productive cough over the last 3 days. Abdominal x-ray showed no clear signs of obstruction. CXR showed focal airspace opacity in the left lung base with tiny pleural effusion suggestive of pneumonia. Pt started on vanco and cefepime.   Assessment & Plan:     Acute respiratory failure with hypoxia / Acute COPD exacerbation ? HCAP - mrsa screening negative, Legionella negative ,Strep pneumonia negative ,Blood cx so far show no growth, no fever, -less wheezing, still significant DOE, report right sided chest pain -ct chest showed enlarging right sided pleural effusion , mild ascites -right thoracentesis with culture/cell counts /fluids study and cytology -he was started on vanco /zosyn on admission then cefepime, change abx to augmentin today, taper steroids, continue nebs/mucinex,   bilateral ankle edema , h/o diastolic chf Recent echo with diastolic dysfunction S/p lasix on 1/24, venous doppler to r/o DVT.  Leukocytosis No leukocytosis on presentation He developed leukocytosis after steroids taper steroids, Continue to monitor   Adenocarcinoma of left lung, stage 4 ([place])  -Diagnosed in 2/ 2018 -with spine metastasis completed  XRT in 05/2016 - on alimta maintanence therapy q3wks, last dose on 12/28, Management per oncology   CKD stage 3 - Baseline Cr in 12/2016 was 1.8 - Ct 1.9 this am, possibly from vanco which will be stopped today - Monitor daily BMP  Anemia of CKD, anemia in the setting of  malignancy - he received prbcx2units in oncology office on 1/18 - Hgb stable  Essential hypertension - d/c Norvasc due to ankle edema -start low dose cardioselective betablocker bisoprolol  Constipation: stool softener   Body mass index is 18.7 kg/m.  DVT prophylaxis: lovenox  Code Status: Full Family Communication: no family at the bedside this am Disposition Plan: not yet stable for discharge, still significant DOE, return to alf with home 02 once resp status improves    Consultants:   IR  Procedures:   Right thoracentesis  Antimicrobials:   Vanco 1/20 --> 1/23  Cefepime 1/20 -->  Subjective: Less Wheezing , + DOE, + bilateral ankle edema, report right sided chest pain He is on 2liter o2  No fever, no cough during encounter No bm for several days, report abdomen feels tight  Objective: Vitals:   02/13/17 1512 02/13/17 1952 02/13/17 2102 02/14/17 0348  BP:   (!) 144/75 (!) 151/79  Pulse:   91 69  Resp:   19 20  Temp:   97.8 F (36.6 C) 97.7 F (36.5 C)  TempSrc:   Oral Oral  SpO2: 98% 96% 100% 99%  Weight:      Height:        Intake/Output Summary (Last 24 hours) at 02/14/2017 0857 Last data filed at 02/14/2017 0200 Gross per 24 hour  Intake 820 ml  Output 1305 ml  Net -485 ml   Filed Weights   02/10/17 0127  Weight: 55.8 kg (123 lb 0.3 oz)    Physical Exam  Constitutional: thin, DOE, less wheezing CVS: RRR, S1/S2 + Pulmonary: less wheezing, right side deminished Abdominal:  Soft. BS +,  no distension, tenderness, rebound or guarding.  Musculoskeletal: Normal range of motion. Bilateral ankle pitting edema Neuro: Alert. Normal reflexes, muscle tone coordination. No cranial nerve deficit. Skin: Skin is warm and dry. No rash noted. Not diaphoretic. No erythema. No pallor.  Psychiatric: Normal mood and affect. Behavior, judgment, thought content normal.    Data Reviewed: I have personally reviewed following labs and imaging  studies  CBC: Recent Labs  Lab 02/07/17 1131 02/09/17 2014 02/10/17 0456 02/11/17 0438 02/12/17 0418 02/13/17 0406 02/14/17 0422  WBC 5.2 10.3 9.4 13.7* 17.8* 20.1* 22.3*  NEUTROABS 4.5 8.2*  --   --   --   --  20.6*  HGB 8.0* 9.8* 9.8* 9.1* 9.4* 9.3* 9.4*  HCT 23.8* 29.3* 29.8* 28.1* 28.7* 28.1* 28.6*  MCV 105.1* 101.4* 101.7* 102.6* 103.6* 102.9* 103.2*  PLT 369 341 379 331 365 345 073   Basic Metabolic Panel: Recent Labs  Lab 02/10/17 0456 02/11/17 0438 02/12/17 0418 02/13/17 0406 02/14/17 0422  NA 137 137 136 137 137  K 4.1 4.6 4.1 4.2 3.9  CL 104 106 103 104 100*  CO2 24 24 25 26 27   GLUCOSE 161* 141* 145* 155* 151*  BUN 19 21* 29* 38* 44*  CREATININE 1.47* 1.61* 1.90* 1.60* 1.92*  CALCIUM 9.1 9.3 9.4 9.4 9.7   GFR: Estimated Creatinine Clearance: 33.5 mL/min (A) (by C-G formula based on SCr of 1.92 mg/dL (H)). Liver Function Tests: Recent Labs  Lab 02/07/17 1131 02/09/17 2014 02/14/17 0422  AST 33 48* 34  ALT 28 25 35  ALKPHOS 160* 122 142*  BILITOT 0.4 0.4 0.1*  PROT 7.9 6.9 7.1  ALBUMIN 3.0* 2.9* 3.1*   No results for input(s): LIPASE, AMYLASE in the last 168 hours. No results for input(s): AMMONIA in the last 168 hours. Coagulation Profile: No results for input(s): INR, PROTIME in the last 168 hours. Cardiac Enzymes: No results for input(s): CKTOTAL, CKMB, CKMBINDEX, TROPONINI in the last 168 hours. BNP (last 3 results) No results for input(s): PROBNP in the last 8760 hours. HbA1C: No results for input(s): HGBA1C in the last 72 hours. CBG: No results for input(s): GLUCAP in the last 168 hours. Lipid Profile: No results for input(s): CHOL, HDL, LDLCALC, TRIG, CHOLHDL, LDLDIRECT in the last 72 hours. Thyroid Function Tests: No results for input(s): TSH, T4TOTAL, FREET4, T3FREE, THYROIDAB in the last 72 hours. Anemia Panel: Recent Labs    02/14/17 0422  VITAMINB12 769  FOLATE 6.4   Urine analysis:    Component Value Date/Time    COLORURINE YELLOW 12/30/2016 2049   APPEARANCEUR CLEAR 12/30/2016 2049   LABSPEC 1.005 12/30/2016 2049   PHURINE 7.0 12/30/2016 2049   GLUCOSEU NEGATIVE 12/30/2016 2049   HGBUR NEGATIVE 12/30/2016 2049   [place] 12/30/2016 2049   KETONESUR NEGATIVE 12/30/2016 2049   PROTEINUR NEGATIVE 12/30/2016 2049   UROBILINOGEN 0.2 05/29/2011 0122   NITRITE NEGATIVE 12/30/2016 2049   LEUKOCYTESUR NEGATIVE 12/30/2016 2049   Sepsis Labs: @LABRCNTIP (procalcitonin:4,lacticidven:4)   ) Recent Results (from the past 240 hour(s))  Blood culture (routine x 2)     Status: None (Preliminary result)   Collection Time: 02/09/17 10:45 PM  Result Value Ref Range Status   Specimen Description BLOOD RIGHT ANTECUBITAL  Final   Special Requests   Final    BOTTLES DRAWN AEROBIC AND ANAEROBIC Blood Culture results may not be optimal due to an inadequate volume of blood received in culture bottles   Culture   Final  NO GROWTH 3 DAYS Performed at [facility], [place] [address] [place], [place] [address]    Report Status PENDING  Incomplete  Blood culture (routine x 2)     Status: None (Preliminary result)   Collection Time: 02/09/17 10:45 PM  Result Value Ref Range Status   Specimen Description BLOOD LEFT ARM  Final   Special Requests   Final    BOTTLES DRAWN AEROBIC AND ANAEROBIC Blood Culture results may not be optimal due to an inadequate volume of blood received in culture bottles   Culture   Final    NO GROWTH 3 DAYS Performed at [facility], [place] [address] [place], [place] [address]    Report Status PENDING  Incomplete  MRSA PCR Screening     Status: None   Collection Time: 02/10/17  1:45 AM  Result Value Ref Range Status   MRSA by PCR NEGATIVE NEGATIVE Final    Comment:        The GeneXpert MRSA Assay (FDA approved for NASAL specimens only), is one component of a comprehensive MRSA colonization surveillance program. It is not intended to diagnose MRSA infection nor  to guide or monitor treatment for MRSA infections.       Radiology Studies: Ct Chest Wo Contrast  Result Date: 02/14/2017 CLINICAL DATA:  Increased short of breath and productive cough EXAM: CT CHEST WITHOUT CONTRAST TECHNIQUE: Multidetector CT imaging of the chest was performed following the standard protocol without IV contrast. COMPARISON:  12/19/2016 CT, 11/27/2016, 07/30/2016, 05/08/2016, CT abdomen pelvis 12/16/2016 FINDINGS: Cardiovascular: Limited without intravenous contrast. Nonaneurysmal aorta. Normal heart size. Trace pericardial effusion. Central venous catheter tip in the distal SVC. Mediastinum/Nodes: Midline trachea. No thyroid mass. No significant adenopathy. Esophagus unremarkable. Lungs/Pleura: Moderate right pleural effusion, increased compared to prior. Stable confluent spiculated soft tissue density in the left upper lobe with bronchiectasis. Moderate emphysema multifocal scarring in the left lower lobe. Slight increased interstitial density with peripheral airspace disease at the left lung base. Upper Abdomen: Vague hypodensity near the falciform ligament may reflect prominent fatty change. Small amount of ascites in the abdomen Musculoskeletal: Stable sclerosis at T12. IMPRESSION: 1. Moderate right pleural effusion, increased compared to prior chest CT. 2. Mild interstitial density and peripheral airspace disease in the left lung base which may reflect interstitial inflammation and small foci of infection. 3. Stable confluent soft tissue density in the left upper lobe 4. Moderate emphysema. Emphysema (ICD10-J43.9). Electronically Signed   By: Donavan Foil M.D.   On: 02/14/2017 02:19        Scheduled Meds: . arformoterol  15 mcg Nebulization BID  . bisoprolol  5 mg Oral Daily  . budesonide (PULMICORT) nebulizer solution  0.5 mg Nebulization BID  . enoxaparin (LOVENOX) injection  30 mg Subcutaneous Q24H  . guaiFENesin  600 mg Oral BID  . ipratropium-albuterol  3 mL  Nebulization TID  . loratadine  10 mg Oral Daily  . mouth rinse  15 mL Mouth Rinse BID  . methylPREDNISolone (SOLU-MEDROL) injection  60 mg Intravenous Q8H  . pantoprazole  40 mg Oral BID  . tuberculin  5 Units Intradermal Once   Continuous Infusions: . ceFEPime (MAXIPIME) IV Stopped (02/14/17 0203)     LOS: 5 days    Time spent: 35 minutes  Greater than 50% of the time spent on counseling and coordinating the care.   Florencia Reasons, MD PhD [facility] Pager [phone]  If 7PM-7AM, please contact night-coverage [email or web address] Password [facility] 02/14/2017, 8:57  AM

## 2017-02-14 NOTE — Procedures (Signed)
Ultrasound-guided diagnostic and therapeutic right thoracentesis performed yielding 510 cc  of blood-tinged fluid. No immediate complications. Follow-up chest x-ray pending. The fluid was sent to the lab for preordered studies.
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
## 2017-02-15 ENCOUNTER — Inpatient Hospital Stay (HOSPITAL_COMMUNITY): Payer: Medicaid Other

## 2017-02-15 LAB — BASIC METABOLIC PANEL
ANION GAP: 8 (ref 5–15)
BUN: 46 mg/dL — ABNORMAL HIGH (ref 6–20)
CALCIUM: 9.2 mg/dL (ref 8.9–10.3)
CHLORIDE: 98 mmol/L — AB (ref 101–111)
CO2: 26 mmol/L (ref 22–32)
Creatinine, Ser: 1.63 mg/dL — ABNORMAL HIGH (ref 0.61–1.24)
GFR calc non Af Amer: 45 mL/min — ABNORMAL LOW (ref >60)
GFR, EST AFRICAN AMERICAN: 52 mL/min — AB (ref >60)
Glucose, Bld: 141 mg/dL — ABNORMAL HIGH (ref 65–99)
POTASSIUM: 4.1 mmol/L (ref 3.5–5.1)
Sodium: 132 mmol/L — ABNORMAL LOW (ref 135–145)

## 2017-02-15 LAB — CBC WITH DIFFERENTIAL/PLATELET
Band Neutrophils: 1 %
Basophils Absolute: 0 10*3/uL (ref 0.0–0.1)
Basophils Relative: 0 %
EOS ABS: 0 10*3/uL (ref 0.0–0.7)
EOS PCT: 0 %
HEMATOCRIT: 28.7 % — AB (ref 39.0–52.0)
HEMOGLOBIN: 9.3 g/dL — AB (ref 13.0–17.0)
LYMPHS PCT: 6 %
Lymphs Abs: 1.5 10*3/uL (ref 0.7–4.0)
MCH: 33.7 pg (ref 26.0–34.0)
MCHC: 32.4 g/dL (ref 30.0–36.0)
MCV: 104 fL — ABNORMAL HIGH (ref 78.0–100.0)
MONOS PCT: 4 %
Monocytes Absolute: 1 10*3/uL (ref 0.1–1.0)
Neutro Abs: 22.1 10*3/uL — ABNORMAL HIGH (ref 1.7–7.7)
Neutrophils Relative %: 89 %
Platelets: 315 10*3/uL (ref 150–400)
RBC: 2.76 MIL/uL — AB (ref 4.22–5.81)
RDW: 18.8 % — ABNORMAL HIGH (ref 11.5–15.5)
WBC: 24.6 10*3/uL — AB (ref 4.0–10.5)

## 2017-02-15 LAB — CULTURE, BLOOD (ROUTINE X 2)
Culture: NO GROWTH
Culture: NO GROWTH

## 2017-02-15 MED ORDER — ENOXAPARIN SODIUM 40 MG/0.4ML ~~LOC~~ SOLN: 40.0000 mg | SUBCUTANEOUS | Status: DC

## 2017-02-15 MED ORDER — PREDNISONE 50 MG PO TABS
50.0000 mg | ORAL_TABLET | Freq: Every day | ORAL | Status: DC
Start: 2017-02-16 — End: 2017-02-16
  Administered 2017-02-16: 50 mg via ORAL
  Filled 2017-02-15: qty 1

## 2017-02-15 MED ORDER — BISACODYL 10 MG RE SUPP
10.0000 mg | Freq: Every day | RECTAL | Status: DC
  Filled 2017-02-15: qty 1

## 2017-02-15 NOTE — Progress Notes (Addendum)
RN in room to administer medications.  Patient had earlier in the morning told RN his bowels had not moved for several days.  Primary MD had called this RN to say she had ordered a suppository and an abdominal ultrasound d/t patients constipation/distention.  Patient took Miralax and Senokot, RN explained to patient that she had a suppository for patient.  Patient had difficulty understanding that a suppository went into his rectum, did not understand what rectum meant so this RN told him it went up his bottom to help his bowels move.  Patient also seemed confused about what bowels move meant so RN used the work "poop".  Patient frustrated about this, said he did not want the suppository.  Patient did confirm that he had not "pooped" for 3 to 4 days.    Patient also made comments about the clean linen that was on the shelf in his room.  This RN offered to help patient change his gown and his bed if he were ready as well as help wash up.  Patient said "forget it" and "you are trying to do too much".  RN told patient to please let her know if he needs anything and left room.

## 2017-02-15 NOTE — Plan of Care (Signed)
Patient without complaint during 7 a to 7 p shift, up walking in room, abdominal ultrasound results pending.

## 2017-02-15 NOTE — Progress Notes (Signed)
TB test placed to R anterior forearm 02/13/17 read as negative, 0 mm induration.

## 2017-02-15 NOTE — Progress Notes (Signed)
Patient ID: [name], male   DOB: 1960/09/27, 57 y.o.   MRN: [id number]  PROGRESS NOTE    KRON EVERTON  YQM:[id number] DOB: [birth year] DOA: 02/09/2017  PCP: Patient, No Pcp Per   Brief Narrative:  57 y.o. male with medical history significant of history of stage IV NSCLC undergoing chemotherapy, HTN, diastolic CHF underlying emphysema/COPD, intermittently on 2 L of oxygen, seizure disorder, and gastritis who presented with complaints of worsening shortness of breath and productive cough over the last 3 days. Abdominal x-ray showed no clear signs of obstruction. CXR showed focal airspace opacity in the left lung base with tiny pleural effusion suggestive of pneumonia. Pt started on vanco and cefepime.   Assessment & Plan:     Acute on chronic respiratory failure with hypoxia ( he is started on home o2 afew months ago)/ Acute COPD exacerbation ? HCAP, pleural effusion - mrsa screening negative, Legionella negative ,Strep pneumonia negative ,Blood cx so far show no growth, no fever,  --ct chest showed enlarging right sided pleural effusion , mild ascites -right thoracentesis with culture/cell counts /fluids study and cytology on 1/25 -he was started on vanco /zosyn on admission then cefepime, change abx to augmentin today, taper steroids, continue nebs/mucinex, -wheezing has resolved, less DOE, report right sided chest pain, report not able to cough up anything, start flutter valve  bilateral ankle edema , h/o diastolic chf Recent echo with diastolic dysfunction S/p lasix on 1/24, venous doppler to r/o DVT.  Abdominal tightness, will get [place] to access ascites  Leukocytosis No leukocytosis on presentation He developed leukocytosis after steroids taper steroids, Continue to monitor   Adenocarcinoma of left lung, stage 4 ([place])  -Diagnosed in 2/ 2018 -with spine metastasis completed  XRT in 05/2016 - on alimta maintanence therapy q3wks, last dose on 12/28, Management per  oncology   CKD stage 3 - Baseline Cr in 12/2016 was 1.8 - Ct fluctuating but close to baseline, - avoid nephrotoxin, renal dosing meds   Anemia of CKD, anemia in the setting of malignancy - he received prbcx2units in oncology office on 1/18 Macrocytosis,. mcv 104, b12/folate/tsh unremarkable, he is followed by hematology/oncology - Hgb stable  Essential hypertension - d/c Norvasc due to ankle edema -start low dose cardioselective betablocker bisoprolol  Constipation: stool softener   Malnutrition:  Body mass index is 18.7 kg/m.   Body mass index is 18.7 kg/m.  DVT prophylaxis: lovenox  Code Status: Full Family Communication: no family at the bedside this am Disposition Plan: improving, likely able to return to alf with home 02 on 1/27   Consultants:   IR  Procedures:   Right thoracentesis on 1/25  Antimicrobials:   Vanco 1/20 --> 1/23  Cefepime 1/20 -->  Subjective: No fever, less DOE, + bilateral ankle edema, report right sided chest pain, can not cough up anything He is on 2liter o2   No bm for several days, report abdomen feels tight  Objective: Vitals:   02/14/17 2025 02/14/17 2057 02/15/17 0632 02/15/17 0923  BP:  136/77 135/82   Pulse:  69 70   Resp:  18 16   Temp:  98.5 F (36.9 C) 97.6 F (36.4 C)   TempSrc:  Oral Oral   SpO2: 98% 98% 100% 97%  Weight:      Height:        Intake/Output Summary (Last 24 hours) at 02/15/2017 1001 Last data filed at 02/15/2017 5284 Gross per 24 hour  Intake 1070 ml  Output 300  ml  Net 770 ml   Filed Weights   02/10/17 0127  Weight: 55.8 kg (123 lb 0.3 oz)    Physical Exam  Constitutional: thin, NAD at rest CVS: RRR, S1/S2 + Pulmonary:  Wheezing has resolved, right side with improved aeration, left side diminished Abdominal: slightly distended, tight, nontender, + bs.  Musculoskeletal: Normal range of motion.+ Bilateral ankle pitting edema Neuro: Alert. Normal reflexes, muscle tone coordination.  No cranial nerve deficit. Skin: Skin is warm and dry. No rash noted. Not diaphoretic. No erythema. No pallor.  Psychiatric: Normal mood and affect. Behavior, judgment, thought content normal.    Data Reviewed: I have personally reviewed following labs and imaging studies  CBC: Recent Labs  Lab 02/09/17 2014  02/11/17 0438 02/12/17 0418 02/13/17 0406 02/14/17 0422 02/15/17 0449  WBC 10.3   < > 13.7* 17.8* 20.1* 22.3* 24.6*  NEUTROABS 8.2*  --   --   --   --  20.6* 22.1*  HGB 9.8*   < > 9.1* 9.4* 9.3* 9.4* 9.3*  HCT 29.3*   < > 28.1* 28.7* 28.1* 28.6* 28.7*  MCV 101.4*   < > 102.6* 103.6* 102.9* 103.2* 104.0*  PLT 341   < > 331 365 345 319 315   < > = values in this interval not displayed.   Basic Metabolic Panel: Recent Labs  Lab 02/11/17 0438 02/12/17 0418 02/13/17 0406 02/14/17 0422 02/15/17 0449  NA 137 136 137 137 132*  K 4.6 4.1 4.2 3.9 4.1  CL 106 103 104 100* 98*  CO2 24 25 26 27 26   GLUCOSE 141* 145* 155* 151* 141*  BUN 21* 29* 38* 44* 46*  CREATININE 1.61* 1.90* 1.60* 1.92* 1.63*  CALCIUM 9.3 9.4 9.4 9.7 9.2   GFR: Estimated Creatinine Clearance: 39.5 mL/min (A) (by C-G formula based on SCr of 1.63 mg/dL (H)). Liver Function Tests: Recent Labs  Lab 02/09/17 2014 02/14/17 0422  AST 48* 34  ALT 25 35  ALKPHOS 122 142*  BILITOT 0.4 0.1*  PROT 6.9 7.1  ALBUMIN 2.9* 3.1*   No results for input(s): LIPASE, AMYLASE in the last 168 hours. No results for input(s): AMMONIA in the last 168 hours. Coagulation Profile: No results for input(s): INR, PROTIME in the last 168 hours. Cardiac Enzymes: No results for input(s): CKTOTAL, CKMB, CKMBINDEX, TROPONINI in the last 168 hours. BNP (last 3 results) No results for input(s): PROBNP in the last 8760 hours. HbA1C: No results for input(s): HGBA1C in the last 72 hours. CBG: No results for input(s): GLUCAP in the last 168 hours. Lipid Profile: No results for input(s): CHOL, HDL, LDLCALC, TRIG, CHOLHDL,  LDLDIRECT in the last 72 hours. Thyroid Function Tests: No results for input(s): TSH, T4TOTAL, FREET4, T3FREE, THYROIDAB in the last 72 hours. Anemia Panel: Recent Labs    02/14/17 0422  VITAMINB12 769  FOLATE 6.4   Urine analysis:    Component Value Date/Time   COLORURINE YELLOW 12/30/2016 2049   APPEARANCEUR CLEAR 12/30/2016 2049   LABSPEC 1.005 12/30/2016 2049   PHURINE 7.0 12/30/2016 2049   GLUCOSEU NEGATIVE 12/30/2016 2049   HGBUR NEGATIVE 12/30/2016 2049   BILIRUBINUR NEGATIVE 12/30/2016 2049   KETONESUR NEGATIVE 12/30/2016 2049   PROTEINUR NEGATIVE 12/30/2016 2049   UROBILINOGEN 0.2 05/29/2011 0122   NITRITE NEGATIVE 12/30/2016 2049   LEUKOCYTESUR NEGATIVE 12/30/2016 2049   Sepsis Labs: @LABRCNTIP (procalcitonin:4,lacticidven:4)   ) Recent Results (from the past 240 hour(s))  Blood culture (routine x 2)     Status: None (  Preliminary result)   Collection Time: 02/09/17 10:45 PM  Result Value Ref Range Status   Specimen Description BLOOD RIGHT ANTECUBITAL  Final   Special Requests   Final    BOTTLES DRAWN AEROBIC AND ANAEROBIC Blood Culture results may not be optimal due to an inadequate volume of blood received in culture bottles   Culture   Final    NO GROWTH 4 DAYS Performed at [facility] [address], [place], [place] [address]    Report Status PENDING  Incomplete  Blood culture (routine x 2)     Status: None (Preliminary result)   Collection Time: 02/09/17 10:45 PM  Result Value Ref Range Status   Specimen Description BLOOD LEFT ARM  Final   Special Requests   Final    BOTTLES DRAWN AEROBIC AND ANAEROBIC Blood Culture results may not be optimal due to an inadequate volume of blood received in culture bottles   Culture   Final    NO GROWTH 4 DAYS Performed at [facility], [place] [address] [place], [place] [address]    Report Status PENDING  Incomplete  MRSA PCR Screening     Status: None   Collection Time: 02/10/17  1:45 AM  Result  Value Ref Range Status   MRSA by PCR NEGATIVE NEGATIVE Final    Comment:        The GeneXpert MRSA Assay (FDA approved for NASAL specimens only), is one component of a comprehensive MRSA colonization surveillance program. It is not intended to diagnose MRSA infection nor to guide or monitor treatment for MRSA infections.   Body fluid culture     Status: None (Preliminary result)   Collection Time: 02/14/17 11:05 AM  Result Value Ref Range Status   Specimen Description PLEURAL RIGHT  Final   Special Requests NONE  Final   Gram Stain   Final    RARE WBC PRESENT, PREDOMINANTLY MONONUCLEAR NO ORGANISMS SEEN    Culture   Final    NO GROWTH 1 DAY Performed at [facility], [place] [address], [place], [place] [address]    Report Status PENDING  Incomplete      Radiology Studies: Dg Chest 1 View  Result Date: 02/14/2017 CLINICAL DATA:  Post RIGHT thoracentesis EXAM: CHEST 1 VIEW COMPARISON:  02/09/2017 FINDINGS: RIGHT jugular Port-A-Cath with tip projecting over cavoatrial junction. Upper normal heart size. Mediastinal contours and pulmonary vascularity normal. Interstitial infiltrates at LEFT base persist with minimal basilar atelectasis. LEFT suprahilar density again identified as noted on recent CT. Decrease in RIGHT pleural effusion post thoracentesis without evidence of pneumothorax. No new areas of infiltrate. IMPRESSION: No pneumothorax following RIGHT thoracentesis. Persistent LEFT basilar infiltrate and LEFT suprahilar soft tissue opacity. Electronically Signed   By: Lavonia Dana M.D.   On: 02/14/2017 11:45   Ct Chest Wo Contrast  Result Date: 02/14/2017 CLINICAL DATA:  Increased short of breath and productive cough EXAM: CT CHEST WITHOUT CONTRAST TECHNIQUE: Multidetector CT imaging of the chest was performed following the standard protocol without IV contrast. COMPARISON:  12/19/2016 CT, 11/27/2016, 07/30/2016, 05/08/2016, CT abdomen pelvis 12/16/2016 FINDINGS:  Cardiovascular: Limited without intravenous contrast. Nonaneurysmal aorta. Normal heart size. Trace pericardial effusion. Central venous catheter tip in the distal SVC. Mediastinum/Nodes: Midline trachea. No thyroid mass. No significant adenopathy. Esophagus unremarkable. Lungs/Pleura: Moderate right pleural effusion, increased compared to prior. Stable confluent spiculated soft tissue density in the left upper lobe with bronchiectasis. Moderate emphysema multifocal scarring in the left lower lobe.  Slight increased interstitial density with peripheral airspace disease at the left lung base. Upper Abdomen: Vague hypodensity near the falciform ligament may reflect prominent fatty change. Small amount of ascites in the abdomen Musculoskeletal: Stable sclerosis at T12. IMPRESSION: 1. Moderate right pleural effusion, increased compared to prior chest CT. 2. Mild interstitial density and peripheral airspace disease in the left lung base which may reflect interstitial inflammation and small foci of infection. 3. Stable confluent soft tissue density in the left upper lobe 4. Moderate emphysema. Emphysema (ICD10-J43.9). Electronically Signed   By: Donavan Foil M.D.   On: 02/14/2017 02:19   US Thoracentesis Asp Pleural Space W/img Guide  Result Date: 02/14/2017 INDICATION: Stage IV non-small cell lung cancer, CHF, dyspnea, recurrent right pleural effusion; request made for diagnostic and therapeutic right thoracentesis. EXAM: ULTRASOUND GUIDED DIAGNOSTIC AND THERAPEUTIC RIGHT THORACENTESIS MEDICATIONS: None. COMPLICATIONS: None immediate. PROCEDURE: An ultrasound guided thoracentesis was thoroughly discussed with the patient and questions answered. The benefits, risks, alternatives and complications were also discussed. The patient understands and wishes to proceed with the procedure. Written consent was obtained. Ultrasound was performed to localize and mark an adequate pocket of fluid in the right chest. The area was  then prepped and draped in the normal sterile fashion. 1% Lidocaine was used for local anesthesia. Under ultrasound guidance a Yueh catheter was introduced. Thoracentesis was performed. The catheter was removed and a dressing applied. FINDINGS: A total of approximately 510 cc of blood-tinged fluid was removed. Samples were sent to the laboratory as requested by the clinical team. IMPRESSION: Successful ultrasound guided diagnostic and therapeutic right thoracentesis yielding 510 cc of pleural fluid. Postprocedure chest x-ray revealed no pneumothorax. Read by: Rowe Robert, PA-C Electronically Signed   By: Lucrezia Europe M.D.   On: 02/14/2017 12:00        Scheduled Meds: . amoxicillin-clavulanate  500 mg Oral BID  . arformoterol  15 mcg Nebulization BID  . bisoprolol  5 mg Oral Daily  . budesonide (PULMICORT) nebulizer solution  0.5 mg Nebulization BID  . enoxaparin (LOVENOX) injection  30 mg Subcutaneous Q24H  . guaiFENesin  600 mg Oral BID  . ipratropium-albuterol  3 mL Nebulization TID  . loratadine  10 mg Oral Daily  . mouth rinse  15 mL Mouth Rinse BID  . pantoprazole  40 mg Oral BID  . polyethylene glycol  17 g Oral Daily  . [START ON 02/16/2017] predniSONE  50 mg Oral Q breakfast  . senna-docusate  1 tablet Oral BID  . tuberculin  5 Units Intradermal Once   Continuous Infusions:    LOS: 6 days    Time spent: 35 minutes  Greater than 50% of the time spent on counseling and coordinating the care.   Florencia Reasons, MD PhD [facility] Pager [phone]  If 7PM-7AM, please contact night-coverage [email or web address] Password [email or web address] 02/15/2017, 10:01 AM

## 2017-02-16 LAB — CBC WITH DIFFERENTIAL/PLATELET
BASOS PCT: 0 %
Band Neutrophils: 1 %
Basophils Absolute: 0 10*3/uL (ref 0.0–0.1)
Blasts: 0 %
Eosinophils Absolute: 0 10*3/uL (ref 0.0–0.7)
Eosinophils Relative: 0 %
HCT: 29.8 % — ABNORMAL LOW (ref 39.0–52.0)
Hemoglobin: 9.7 g/dL — ABNORMAL LOW (ref 13.0–17.0)
LYMPHS PCT: 3 %
Lymphs Abs: 0.8 10*3/uL (ref 0.7–4.0)
MCH: 34.2 pg — AB (ref 26.0–34.0)
MCHC: 32.6 g/dL (ref 30.0–36.0)
MCV: 104.9 fL — AB (ref 78.0–100.0)
MONO ABS: 3.1 10*3/uL — AB (ref 0.1–1.0)
MONOS PCT: 12 %
Metamyelocytes Relative: 2 %
Myelocytes: 3 %
NEUTROS ABS: 22 10*3/uL — AB (ref 1.7–7.7)
NEUTROS PCT: 79 %
OTHER: 0 %
PROMYELOCYTES ABS: 0 %
Platelets: 280 10*3/uL (ref 150–400)
RBC: 2.84 MIL/uL — ABNORMAL LOW (ref 4.22–5.81)
RDW: 18.9 % — ABNORMAL HIGH (ref 11.5–15.5)
WBC: 25.9 10*3/uL — ABNORMAL HIGH (ref 4.0–10.5)
nRBC: 1 /100 WBC — ABNORMAL HIGH

## 2017-02-16 LAB — COMPREHENSIVE METABOLIC PANEL
ALK PHOS: 151 U/L — AB (ref 38–126)
ALT: 40 U/L (ref 17–63)
AST: 35 U/L (ref 15–41)
Albumin: 2.9 g/dL — ABNORMAL LOW (ref 3.5–5.0)
Anion gap: 7 (ref 5–15)
BUN: 46 mg/dL — ABNORMAL HIGH (ref 6–20)
CALCIUM: 9.3 mg/dL (ref 8.9–10.3)
CO2: 27 mmol/L (ref 22–32)
CREATININE: 1.31 mg/dL — AB (ref 0.61–1.24)
Chloride: 103 mmol/L (ref 101–111)
GFR calc non Af Amer: 59 mL/min — ABNORMAL LOW (ref >60)
Glucose, Bld: 92 mg/dL (ref 65–99)
Potassium: 3.8 mmol/L (ref 3.5–5.1)
Sodium: 137 mmol/L (ref 135–145)
TOTAL PROTEIN: 6.4 g/dL — AB (ref 6.5–8.1)
Total Bilirubin: 0.4 mg/dL (ref 0.3–1.2)

## 2017-02-16 LAB — PH, BODY FLUID: PH, BODY FLUID: 7.6

## 2017-02-16 MED ORDER — GUAIFENESIN ER 600 MG PO TB12: 600.0000 mg | ORAL_TABLET | Freq: Two times a day (BID) | ORAL | 0 refills | Status: DC

## 2017-02-16 MED ORDER — FUROSEMIDE 40 MG PO TABS: 40.0000 mg | ORAL_TABLET | ORAL | 0 refills | Status: DC | PRN

## 2017-02-16 MED ORDER — SENNOSIDES-DOCUSATE SODIUM 8.6-50 MG PO TABS: 1.0000 | ORAL_TABLET | Freq: Every day | ORAL | 0 refills | Status: DC

## 2017-02-16 MED ORDER — AMOXICILLIN-POT CLAVULANATE 500-125 MG PO TABS: 500.0000 mg | ORAL_TABLET | Freq: Two times a day (BID) | ORAL | 0 refills | Status: AC

## 2017-02-16 MED ORDER — BISOPROLOL FUMARATE 5 MG PO TABS: 5.0000 mg | ORAL_TABLET | Freq: Every day | ORAL | 0 refills | Status: DC

## 2017-02-16 MED ORDER — PREDNISONE 10 MG PO TABS: ORAL_TABLET | ORAL | 0 refills | Status: DC

## 2017-02-16 NOTE — Plan of Care (Signed)
Discharge instructions reviewed with patient, questions answered, verbalized understanding.  Packet with discharge instructions and prescription given to patient at discharge to give to facility. Other paperwork faxed to facility per social work.  Patient transported via wheelchair to front of hospital and assisted into assisted living [name] to be taken back to facility. Patients oxygen tank in his possession as well as his cell phone and clothes at time of discharge.

## 2017-02-16 NOTE — NC FL2 (Signed)
[place] LEVEL OF CARE SCREENING TOOL     IDENTIFICATION  Patient Name: [name] Birthdate: [birth year] Sex: male Admission Date (Current Location): 02/09/2017  [facility] and [place] Number:  Herbalist and Address:  [facility], [place] [address] [place], [place] [address]      Provider Number: [id number]  Attending Physician Name and Address:  Florencia Reasons, MD  Relative Name and Phone Number:       Current Level of Care: Hospital Recommended Level of Care: [place] Prior Approval Number:    Date Approved/Denied:   PASRR Number:    Discharge Plan: Other (Comment)(ALF)    Current Diagnoses: Patient Active Problem List   Diagnosis Date Noted  . Anemia 02/07/2017  . Severe malnutrition ([place]) 01/03/2017  . DOE (dyspnea on exertion) 12/30/2016  . Nausea   . Hypervolemia   . HCAP (healthcare-associated pneumonia) 12/19/2016  . SOB (shortness of breath) 12/19/2016  . Erosive gastropathy 12/18/2016  . Gastritis 12/18/2016  . Hematemesis 12/16/2016  . Intractable nausea and vomiting 12/16/2016  . HTN (hypertension) 10/30/2016  . Chronic obstructive pulmonary disease ([place]) 09/02/2016  . COPD (chronic obstructive pulmonary disease) ([place]) 08/19/2016  . Dehydration 06/04/2016  . Abdominal pain 06/04/2016  . 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 Adenocarcinoma of left lung, stage 4 ([place]) 05/02/2016  . Goals of care, counseling/discussion 05/02/2016  . Encounter for antineoplastic chemotherapy 05/02/2016  . Tobacco abuse 05/30/2011  . Alcohol abuse 05/30/2011  . Seizure ([place]) 05/28/2011    Orientation RESPIRATION BLADDER Height & Weight     Self, Time, Situation, Place  O2([place] PRN) Continent Weight: 123 lb 0.3 oz (55.8 kg) Height:  5\' 8"  (172.7 cm)  BEHAVIORAL SYMPTOMS/MOOD NEUROLOGICAL BOWEL NUTRITION STATUS      Continent Diet(regular)  AMBULATORY STATUS COMMUNICATION OF NEEDS Skin   Limited Assist  Verbally Normal                       Personal Care Assistance Level of Assistance  Bathing, Dressing Bathing Assistance: Limited assistance   Dressing Assistance: Limited assistance     Functional Limitations Info             SPECIAL CARE FACTORS FREQUENCY                       Contractures      Additional Factors Info  Code Status, Allergies Code Status Info: FULL Allergies Info: NKA           Current Medications (02/16/2017):  This is the current hospital active medication list Current Facility-Administered Medications  Medication Dose Route Frequency Provider Last Rate Last Dose  . acetaminophen (TYLENOL) tablet 650 mg  650 mg Oral Q6H PRN Fuller Plan A, MD   650 mg at 02/11/17 0745   Or  . acetaminophen (TYLENOL) suppository 650 mg  650 mg Rectal Q6H PRN Fuller Plan A, MD      . amoxicillin-clavulanate (AUGMENTIN) 500-125 MG per tablet 500 mg  500 mg Oral BID Florencia Reasons, MD   500 mg at 02/15/17 2112  . arformoterol (BROVANA) nebulizer solution 15 mcg  15 mcg Nebulization BID Fuller Plan A, MD   15 mcg at 02/16/17 0923  . bisacodyl (DULCOLAX) suppository 10 mg  10 mg Rectal Daily Florencia Reasons, MD      . bisoprolol (ZEBETA) tablet 5 mg  5 mg Oral Daily Florencia Reasons, MD  5 mg at 02/15/17 1104  . budesonide (PULMICORT) nebulizer solution 0.5 mg  0.5 mg Nebulization BID Fuller Plan A, MD   0.5 mg at 02/16/17 0918  . enoxaparin (LOVENOX) injection 40 mg  40 mg Subcutaneous Q24H Florencia Reasons, MD      . guaiFENesin [facility]) 12 hr tablet 600 mg  600 mg Oral BID Fuller Plan A, MD   600 mg at 02/15/17 2112  . ipratropium-albuterol (DUONEB) 0.5-2.5 (3) MG/3ML nebulizer solution 3 mL  3 mL Nebulization Q4H PRN Smith, Rondell A, MD      . ipratropium-albuterol (DUONEB) 0.5-2.5 (3) MG/3ML nebulizer solution 3 mL  3 mL Nebulization TID Robbie Lis, MD   3 mL at 02/16/17 0910  . lidocaine-prilocaine (EMLA) cream 1 application  1 application Topical PRN Smith,  Rondell A, MD      . loratadine (CLARITIN) tablet 10 mg  10 mg Oral Daily Tamala Julian, Rondell A, MD   10 mg at 02/15/17 1104  . MEDLINE mouth rinse  15 mL Mouth Rinse BID Fuller Plan A, MD   15 mL at 02/13/17 0845  . morphine (MSIR) tablet 15 mg  15 mg Oral Q4H PRN Fuller Plan A, MD   15 mg at 02/16/17 0241  . ondansetron (ZOFRAN) tablet 4 mg  4 mg Oral Q6H PRN Fuller Plan A, MD       Or  . ondansetron (ZOFRAN) injection 4 mg  4 mg Intravenous Q6H PRN Smith, Rondell A, MD      . pantoprazole (PROTONIX) EC tablet 40 mg  40 mg Oral BID Fuller Plan A, MD   40 mg at 02/15/17 2112  . polyethylene glycol (MIRALAX / GLYCOLAX) packet 17 g  17 g Oral Daily Florencia Reasons, MD   17 g at 02/15/17 1103  . predniSONE (DELTASONE) tablet 50 mg  50 mg Oral Q breakfast Florencia Reasons, MD   50 mg at 02/16/17 4287  . senna-docusate (Senokot-S) tablet 1 tablet  1 tablet Oral BID Florencia Reasons, MD   1 tablet at 02/15/17 2112     Discharge Medications: Please see discharge summary for a list of discharge medications.  Relevant Imaging Results:  Relevant Lab Results:   Additional Information SSN   [id number]  Jorge Ny, LCSW

## 2017-02-16 NOTE — Discharge Summary (Addendum)
Discharge Summary  [name] VOH:[id number] DOB: [birth year]  PCP: Patient, No Pcp Per  Admit date: 02/09/2017 Discharge date: 02/16/2017  Time spent: >43mins, more than 50% time spent on coordination of care.  Recommendations for Outpatient Follow-up:  1. Patient to establish care at [facility]. Hospital discharge follow up in one week.  2.    Follow up with oncology Dr [name] in  Three weeks for state IV lung cancer Dr [name] to follow up on right side pleural effusion cytology result ( from 1/25) and decide on repeat thoracentesis vs pleurx placement for right side pleural effusion at follow up.  PPD test done per ALF request. PPD negative. [facility]   Discharge Diagnoses:  Active Hospital Problems   Diagnosis Date Noted  . HCAP (healthcare-associated pneumonia) 12/19/2016  . Anemia 02/07/2017  . Chronic obstructive pulmonary disease ([place]) 09/02/2016  . Adenocarcinoma of left lung, stage 4 ([place]) 05/02/2016  . Alcohol abuse 05/30/2011    Resolved Hospital Problems  No resolved problems to display.    Discharge Condition: stable  Diet recommendation: regular diet  Filed Weights   02/10/17 0127  Weight: 55.8 kg (123 lb 0.3 oz)    History of present illness: (per admitting MD Dr Tamala Julian) Chief Complaint: Shortness of breath  I have personally briefly reviewed patient's old medical records in [place]   HPI: [name] is a 57 y.o. male with medical history significant of history of stage IV NSCLC undergoing chemotherapy, HTN, diastolic CHF underlying emphysema/COPD, intermittently on 2 L of oxygen, seizure disorder, and gastritis; who presented with complaints of worsening shortness of breath and productive cough over the last 3 days.  Associated symptoms included complaints of chills, wheezing, intermittent chest pain, nausea abdominal distention, and upper abdominal pain(ongoing over the last 7 months).  Due to his symptoms he reports having to utilize his oxygen more than normal.  Denies having any fever, diarrhea, constipation, coughing up blood, or blood in urine/stool. [name] of oncology and notes that his last chemotherapy session was canceled due to him needing a blood transfusion.  He received 1 unit of blood 2 days ago, and then 1 unit of blood yesterday.  Despite receiving blood reports no change in his symptoms.  He reports feeling acutely more short of breath this evening while watching TV.  Last admitted into the hospital 1 month ago for acute respiratory failure secondary to pulmonary edema requiring thoracentesis.    ED Course: Upon admission into the emergency department patient was noted to be afebrile, blood pressure 140/96-140 7/97, O2 saturations 98-100% on 3 L of nasal cannula oxygen.  Labs revealed WBC 10.3, hemoglobin 9.8, BUN 20, creatinine 1.4, albumin 2.9, AST 48, ALT 25, troponin 0, and BNP 40.7.  Abdominal x-ray showed no clear signs of obstruction chest x-ray showed focal airspace opacity in the left lung base with tiny pleural effusion suggestive of pneumonia.  Patient was started on empiric antibiotics of vancomycin and cefepime for the possibility of a community-acquired pneumonia.     Hospital Course:  Principal Problem:   HCAP (healthcare-associated pneumonia) Active Problems:   Alcohol abuse   Adenocarcinoma of left lung, stage 4 (HCC)   Chronic obstructive pulmonary disease (HCC)   Anemia   Acute on chronic respiratory failure with hypoxia ( he is started on home o2 afew months ago)/ Acute COPD exacerbation ? HCAP, pleural effusion - mrsa screening negative, Legionella negative ,Strep pneumonia negative ,Blood cx  no growth, right sided pleural fluids culture no growth, no fever, cough is nonproductive.  --ct chest showed enlarging right sided pleural effusion , mild ascites -right thoracentesis on 1/25, with culture no growth, cytology pending.  -he  was started on vanco /zosyn on admission then cefepime, change abx to augmentin on 1/25, taper steroids, continue nebs/mucinex, -wheezing has resolved, less DOE, he is discharged on augmentin and steroids taper.   bilateral ankle edema , h/o diastolic chf -Recent echo with diastolic dysfunction -S/p lasix on 1/24, venous doppler to r/o DVT. -home meds norvasc discontinued  -he is discharged on lasix 40mg  qthree days prn for edema  Abdominal tightness, ab [place] with minimal ascites  Leukocytosis No leukocytosis on presentation He developed leukocytosis after steroids taper steroids, Continue to monitor  Repeat lab at hospital discharge follow up.  Adenocarcinoma of left lung, stage 4 ([place])  -Diagnosed in 2/ 2018 -with spine metastasis completed  XRT in 05/2016 - on alimta maintanence therapy q3wks, last dose on 12/28, Management per oncology   CKD stage 3 - Baseline Cr in 12/2016 was 1.8 - Ct fluctuating but close to baseline, - avoid nephrotoxin, renal dosing meds   Anemia of CKD, anemia in the setting of malignancy - he received prbcx2units in oncology office on 1/18 Macrocytosis,. mcv 104, b12/folate/tsh unremarkable, he is followed by hematology/oncology - Hgb stable  Essential hypertension - d/c Norvasc due to ankle edema -start low dose cardioselective betablocker bisoprolol  Constipation: stool softener   Malnutrition:  Body mass index is 18.7 kg/m.  erosive gastropathy/gastritis  Recent EGD by [facility] Dr Fuller Plan on 11/2016 Continue ppi bid.     DVT prophylaxis while in the hospital: lovenox  Code Status: Full Family Communication: no family at the bedside this am Disposition Plan:  return to alf with home 02 on 1/27 (patient was previously homeless, he was discharged to alf from last hospitalization)  Consultants:   IR  Procedures:   Right thoracentesis on 1/25  Antimicrobials:   Vanco 1/20 --> 1/23  Cefepime 1/20  -->1/25  Discharge Exam: BP 133/71 (BP Location: Right Arm)   Pulse 61   Temp 98.2 F (36.8 C) (Oral)   Resp 16   Ht 5\' 8"  (1.727 m)   Wt 55.8 kg (123 lb 0.3 oz)   SpO2 97%   BMI 18.70 kg/m   General: thin, NAD Cardiovascular: RRR Respiratory: overall diminished, but wheezing has resolved, no rales, no rhonchi Extremity: mild bilateral ankle pitting edema  Discharge Instructions You were cared for by a hospitalist during your hospital stay. If you have any questions about your discharge medications or the care you received while you were in the hospital after you are discharged, you can call the unit and asked to speak with the hospitalist on call if the hospitalist that took care of you is not available. Once you are discharged, your primary care physician will handle any further medical issues. Please note that NO REFILLS for any discharge medications will be authorized once you are discharged, as it is imperative that you return to your primary care physician (or establish a relationship with a primary care physician if you do not have one) for your aftercare needs so that they can reassess your need for medications and monitor your lab values.  Discharge Instructions    Diet general   Complete by:  As directed    Increase activity slowly   Complete by:  As directed      Allergies as of  02/16/2017   No Known Allergies     Medication List    STOP taking these medications   amLODipine 10 MG tablet Commonly known as:  NORVASC     TAKE these medications   acetaminophen 500 MG tablet Commonly known as:  TYLENOL Take 500 mg by mouth 2 (two) times daily as needed for mild pain or headache.   ALKA-SELTZER PLUS COLD PO Take 2 tablets by mouth at bedtime as needed (COUGH).   amoxicillin-clavulanate 500-125 MG tablet Commonly known as:  AUGMENTIN Take 1 tablet (500 mg total) by mouth 2 (two) times daily for 4 days.   bisoprolol 5 MG tablet Commonly known as:  ZEBETA Take  1 tablet (5 mg total) by mouth daily.   dexamethasone 4 MG tablet Commonly known as:  DECADRON Take 4 mg by mouth as directed. Take 1 tablet twice daily the day before, the day of and the day after chemotherapy every 3 weeks.   furosemide 40 MG tablet Commonly known as:  LASIX Take 1 tablet (40 mg total) by mouth every three (3) days as needed for fluid or edema.   guaiFENesin 600 MG 12 hr tablet Commonly known as:  MUCINEX Take 1 tablet (600 mg total) by mouth 2 (two) times daily.   Ipratropium-Albuterol 20-100 MCG/ACT Aers respimat Commonly known as:  COMBIVENT Inhale 1 puff into the lungs every 6 (six) hours.   mometasone-formoterol 100-5 MCG/ACT Aero Commonly known as:  DULERA Inhale 2 puffs into the lungs 2 (two) times daily.   ondansetron 4 MG disintegrating tablet Commonly known as:  ZOFRAN-ODT Take 4 mg by mouth every 8 (eight) hours as needed for nausea or vomiting.   pantoprazole 40 MG tablet Commonly known as:  PROTONIX Take 1 tablet (40 mg total) by mouth 2 (two) times daily.   predniSONE 10 MG tablet Commonly known as:  DELTASONE Label  & dispense according to the schedule below. 4 Pills PO on day one then, 3 Pills PO on day two, 2 Pills PO on day three, 1Pills PO on day four,  then STOP.  Total of 10 tabs   senna-docusate 8.6-50 MG tablet Commonly known as:  Senokot-S Take 1 tablet by mouth at bedtime.      No Known Allergies Follow-up Information    [facility] Follow up in 1 week(s).   Why:  establish primary care hospital discharge follow up. repeat cbc/bmp at follow up. Contact information: [place] [id number] [phone]       Curt Bears, MD Follow up in 3 week(s).   Specialty:  Oncology Why:  for state IV lung cancer Dr [name] to follow up on pleural effusion and decide on repeat thoracentesis vs pleurx placement for right side pleural effusion. Contact  information: [place] [address] [phone]            The results of significant diagnostics from this hospitalization (including imaging, microbiology, ancillary and laboratory) are listed below for reference.    Significant Diagnostic Studies: Dg Chest 1 View  Result Date: 02/14/2017 CLINICAL DATA:  Post RIGHT thoracentesis EXAM: CHEST 1 VIEW COMPARISON:  02/09/2017 FINDINGS: RIGHT jugular Port-A-Cath with tip projecting over cavoatrial junction. Upper normal heart size. Mediastinal contours and pulmonary vascularity normal. Interstitial infiltrates at LEFT base persist with minimal basilar atelectasis. LEFT suprahilar density again identified as noted on recent CT. Decrease in RIGHT pleural effusion post thoracentesis without evidence of pneumothorax. No new  areas of infiltrate. IMPRESSION: No pneumothorax following RIGHT thoracentesis. Persistent LEFT basilar infiltrate and LEFT suprahilar soft tissue opacity. Electronically Signed   By: Lavonia Dana M.D.   On: 02/14/2017 11:45   Dg Chest 2 View  Result Date: 02/09/2017 CLINICAL DATA:  Shortness of breath history of lung cancer EXAM: CHEST  2 VIEW COMPARISON:  01/07/2017, 01/03/2017, CT chest 12/31/2016 FINDINGS: Right-sided central venous port tip overlies the proximal right atrium.stable appearance of spiculated left upper lobe opacity. Development of focal airspace disease at the lateral left lung base and adjacent tiny pleural effusion. Stable cardiomediastinal silhouette. No pneumothorax. IMPRESSION: 1. Interim development of mild focal airspace disease at the left lung base with tiny left pleural effusion, possible pneumonia 2. Otherwise no significant interval change Electronically Signed   By: Donavan Foil M.D.   On: 02/09/2017 21:55   Dg Abdomen 1 View  Result Date: 02/09/2017 CLINICAL DATA:  Severe abdominal pain EXAM: ABDOMEN - 1 VIEW COMPARISON:  12/23/2016 radiograph FINDINGS: Nonobstructed  bowel-gas pattern. Coarse calcification in the left pelvis. Metallic density in the low pelvis. IMPRESSION: Nonobstructed gas pattern Electronically Signed   By: Donavan Foil M.D.   On: 02/09/2017 21:56   Ct Chest Wo Contrast  Result Date: 02/14/2017 CLINICAL DATA:  Increased short of breath and productive cough EXAM: CT CHEST WITHOUT CONTRAST TECHNIQUE: Multidetector CT imaging of the chest was performed following the standard protocol without IV contrast. COMPARISON:  12/19/2016 CT, 11/27/2016, 07/30/2016, 05/08/2016, CT abdomen pelvis 12/16/2016 FINDINGS: Cardiovascular: Limited without intravenous contrast. Nonaneurysmal aorta. Normal heart size. Trace pericardial effusion. Central venous catheter tip in the distal SVC. Mediastinum/Nodes: Midline trachea. No thyroid mass. No significant adenopathy. Esophagus unremarkable. Lungs/Pleura: Moderate right pleural effusion, increased compared to prior. Stable confluent spiculated soft tissue density in the left upper lobe with bronchiectasis. Moderate emphysema multifocal scarring in the left lower lobe. Slight increased interstitial density with peripheral airspace disease at the left lung base. Upper Abdomen: Vague hypodensity near the falciform ligament may reflect prominent fatty change. Small amount of ascites in the abdomen Musculoskeletal: Stable sclerosis at T12. IMPRESSION: 1. Moderate right pleural effusion, increased compared to prior chest CT. 2. Mild interstitial density and peripheral airspace disease in the left lung base which may reflect interstitial inflammation and small foci of infection. 3. Stable confluent soft tissue density in the left upper lobe 4. Moderate emphysema. Emphysema (ICD10-J43.9). Electronically Signed   By: Donavan Foil M.D.   On: 02/14/2017 02:19   US Abdomen Limited  Result Date: 02/15/2017 CLINICAL DATA:  Clinically suspected ascites, hypertension, lung cancer EXAM: LIMITED ABDOMEN ULTRASOUND FOR ASCITES TECHNIQUE:  Limited ultrasound survey for ascites was performed in all four abdominal quadrants. COMPARISON:  Abdominal radiograph 02/09/2017 FINDINGS: Trace ascites identified in the lower quadrants bilaterally. Volume of ascites visualize is insufficient for paracentesis. IMPRESSION: Trace ascites in the lower quadrants bilaterally. Electronically Signed   By: Lavonia Dana M.D.   On: 02/15/2017 19:41   US Thoracentesis Asp Pleural Space W/img Guide  Result Date: 02/14/2017 INDICATION: Stage IV non-small cell lung cancer, CHF, dyspnea, recurrent right pleural effusion; request made for diagnostic and therapeutic right thoracentesis. EXAM: ULTRASOUND GUIDED DIAGNOSTIC AND THERAPEUTIC RIGHT THORACENTESIS MEDICATIONS: None. COMPLICATIONS: None immediate. PROCEDURE: An ultrasound guided thoracentesis was thoroughly discussed with the patient and questions answered. The benefits, risks, alternatives and complications were also discussed. The patient understands and wishes to proceed with the procedure. Written consent was obtained. Ultrasound was performed to localize and mark an adequate pocket  of fluid in the right chest. The area was then prepped and draped in the normal sterile fashion. 1% Lidocaine was used for local anesthesia. Under ultrasound guidance a Yueh catheter was introduced. Thoracentesis was performed. The catheter was removed and a dressing applied. FINDINGS: A total of approximately 510 cc of blood-tinged fluid was removed. Samples were sent to the laboratory as requested by the clinical team. IMPRESSION: Successful ultrasound guided diagnostic and therapeutic right thoracentesis yielding 510 cc of pleural fluid. Postprocedure chest x-ray revealed no pneumothorax. Read by: Rowe Robert, PA-C Electronically Signed   By: Lucrezia Europe M.D.   On: 02/14/2017 12:00    Microbiology: Recent Results (from the past 240 hour(s))  Blood culture (routine x 2)     Status: None   Collection Time: 02/09/17 10:45 PM   Result Value Ref Range Status   Specimen Description BLOOD RIGHT ANTECUBITAL  Final   Special Requests   Final    BOTTLES DRAWN AEROBIC AND ANAEROBIC Blood Culture results may not be optimal due to an inadequate volume of blood received in culture bottles   Culture   Final    NO GROWTH 5 DAYS Performed at [facility], [place] [address], [place], [place] [address]    Report Status 02/15/2017 FINAL  Final  Blood culture (routine x 2)     Status: None   Collection Time: 02/09/17 10:45 PM  Result Value Ref Range Status   Specimen Description BLOOD LEFT ARM  Final   Special Requests   Final    BOTTLES DRAWN AEROBIC AND ANAEROBIC Blood Culture results may not be optimal due to an inadequate volume of blood received in culture bottles   Culture   Final    NO GROWTH 5 DAYS Performed at [facility], [place] [address] [place], [place] [address]    Report Status 02/15/2017 FINAL  Final  MRSA PCR Screening     Status: None   Collection Time: 02/10/17  1:45 AM  Result Value Ref Range Status   MRSA by PCR NEGATIVE NEGATIVE Final    Comment:        The GeneXpert MRSA Assay (FDA approved for NASAL specimens only), is one component of a comprehensive MRSA colonization surveillance program. It is not intended to diagnose MRSA infection nor to guide or monitor treatment for MRSA infections.   Body fluid culture     Status: None (Preliminary result)   Collection Time: 02/14/17 11:05 AM  Result Value Ref Range Status   Specimen Description PLEURAL RIGHT  Final   Special Requests NONE  Final   Gram Stain   Final    RARE WBC PRESENT, PREDOMINANTLY MONONUCLEAR NO ORGANISMS SEEN    Culture   Final    NO GROWTH 1 DAY Performed at [facility], [place] [address], [place], [place] [address]    Report Status PENDING  Incomplete     Labs: Basic Metabolic Panel: Recent Labs  Lab 02/12/17 0418 02/13/17 0406 02/14/17 0422 02/15/17 0449 02/16/17 0409  NA 136 137 137 132* 137   K 4.1 4.2 3.9 4.1 3.8  CL 103 104 100* 98* 103  CO2 25 26 27 26 27   GLUCOSE 145* 155* 151* 141* 92  BUN 29* 38* 44* 46* 46*  CREATININE 1.90* 1.60* 1.92* 1.63* 1.31*  CALCIUM 9.4 9.4 9.7 9.2 9.3   Liver Function Tests: Recent Labs  Lab 02/09/17 2014 02/14/17 0422 02/16/17 0409  AST 48* 34 35  ALT 25  35 40  ALKPHOS 122 142* 151*  BILITOT 0.4 0.1* 0.4  PROT 6.9 7.1 6.4*  ALBUMIN 2.9* 3.1* 2.9*   No results for input(s): LIPASE, AMYLASE in the last 168 hours. No results for input(s): AMMONIA in the last 168 hours. CBC: Recent Labs  Lab 02/09/17 2014  02/12/17 0418 02/13/17 0406 02/14/17 0422 02/15/17 0449 02/16/17 0409  WBC 10.3   < > 17.8* 20.1* 22.3* 24.6* 25.9*  NEUTROABS 8.2*  --   --   --  20.6* 22.1* 22.0*  HGB 9.8*   < > 9.4* 9.3* 9.4* 9.3* 9.7*  HCT 29.3*   < > 28.7* 28.1* 28.6* 28.7* 29.8*  MCV 101.4*   < > 103.6* 102.9* 103.2* 104.0* 104.9*  PLT 341   < > 365 345 319 315 280   < > = values in this interval not displayed.   Cardiac Enzymes: No results for input(s): CKTOTAL, CKMB, CKMBINDEX, TROPONINI in the last 168 hours. BNP: BNP (last 3 results) Recent Labs    12/30/16 1603 02/09/17 2014  BNP 13.4 40.7    ProBNP (last 3 results) No results for input(s): PROBNP in the last 8760 hours.  CBG: No results for input(s): GLUCAP in the last 168 hours.     Signed:  Florencia Reasons MD, PhD  [facility] 02/16/2017, 9:42 AM

## 2017-02-16 NOTE — Progress Notes (Signed)
Patient will discharge to [name] Anticipated discharge date: 1/27 Transportation by ALF transport- called at 10:50am  CSW signing off.  Jorge Ny, LCSW Clinical Social Worker [phone]

## 2017-02-17 LAB — BODY FLUID CULTURE: Culture: NO GROWTH

## 2017-02-20 ENCOUNTER — Ambulatory Visit: Payer: Medicaid Other

## 2017-02-20 ENCOUNTER — Other Ambulatory Visit: Payer: Medicaid Other

## 2017-02-21 ENCOUNTER — Other Ambulatory Visit: Payer: Medicaid Other

## 2017-03-01 ENCOUNTER — Emergency Department (HOSPITAL_COMMUNITY): Payer: Medicaid Other

## 2017-03-01 ENCOUNTER — Emergency Department (HOSPITAL_COMMUNITY)
Admission: EM | Admit: 2017-03-01 | Discharge: 2017-03-02 | Disposition: A | Discharge status: Home / Self Care | Payer: Medicaid Other | Attending: Emergency Medicine | Admitting: Emergency Medicine

## 2017-03-01 ENCOUNTER — Other Ambulatory Visit: Payer: Self-pay

## 2017-03-01 ENCOUNTER — Encounter (HOSPITAL_COMMUNITY): Payer: Self-pay

## 2017-03-01 DIAGNOSIS — C3490 Malignant neoplasm of unspecified part of unspecified bronchus or lung: Secondary | ICD-10-CM | POA: Insufficient documentation

## 2017-03-01 DIAGNOSIS — Z79899 Other long term (current) drug therapy: Secondary | ICD-10-CM | POA: Diagnosis not present

## 2017-03-01 DIAGNOSIS — R0602 Shortness of breath: Secondary | ICD-10-CM

## 2017-03-01 DIAGNOSIS — J441 Chronic obstructive pulmonary disease with (acute) exacerbation: Secondary | ICD-10-CM | POA: Diagnosis not present

## 2017-03-01 DIAGNOSIS — F17210 Nicotine dependence, cigarettes, uncomplicated: Secondary | ICD-10-CM | POA: Diagnosis not present

## 2017-03-01 LAB — COMPREHENSIVE METABOLIC PANEL
ALBUMIN: 3.3 g/dL — AB (ref 3.5–5.0)
ALK PHOS: 144 U/L — AB (ref 38–126)
ALT: 14 U/L — ABNORMAL LOW (ref 17–63)
AST: 21 U/L (ref 15–41)
Anion gap: 8 (ref 5–15)
BUN: 19 mg/dL (ref 6–20)
CALCIUM: 8.9 mg/dL (ref 8.9–10.3)
CO2: 25 mmol/L (ref 22–32)
CREATININE: 1.61 mg/dL — AB (ref 0.61–1.24)
Chloride: 104 mmol/L (ref 101–111)
GFR calc Af Amer: 53 mL/min — ABNORMAL LOW (ref >60)
GFR calc non Af Amer: 46 mL/min — ABNORMAL LOW (ref >60)
GLUCOSE: 97 mg/dL (ref 65–99)
Potassium: 3.9 mmol/L (ref 3.5–5.1)
SODIUM: 137 mmol/L (ref 135–145)
TOTAL PROTEIN: 7.5 g/dL (ref 6.5–8.1)
Total Bilirubin: 0.3 mg/dL (ref 0.3–1.2)

## 2017-03-01 LAB — CBC WITH DIFFERENTIAL/PLATELET
BASOS PCT: 0 %
Basophils Absolute: 0 10*3/uL (ref 0.0–0.1)
EOS ABS: 0.1 10*3/uL (ref 0.0–0.7)
Eosinophils Relative: 1 %
HEMATOCRIT: 29.9 % — AB (ref 39.0–52.0)
HEMOGLOBIN: 10 g/dL — AB (ref 13.0–17.0)
LYMPHS ABS: 1.3 10*3/uL (ref 0.7–4.0)
Lymphocytes Relative: 10 %
MCH: 34.4 pg — AB (ref 26.0–34.0)
MCHC: 33.4 g/dL (ref 30.0–36.0)
MCV: 102.7 fL — ABNORMAL HIGH (ref 78.0–100.0)
MONOS PCT: 5 %
Monocytes Absolute: 0.6 10*3/uL (ref 0.1–1.0)
NEUTROS ABS: 10.5 10*3/uL — AB (ref 1.7–7.7)
NEUTROS PCT: 84 %
Platelets: 148 10*3/uL — ABNORMAL LOW (ref 150–400)
RBC: 2.91 MIL/uL — ABNORMAL LOW (ref 4.22–5.81)
RDW: 18.4 % — ABNORMAL HIGH (ref 11.5–15.5)
WBC: 12.5 10*3/uL — AB (ref 4.0–10.5)

## 2017-03-01 LAB — INFLUENZA PANEL BY PCR (TYPE A & B)
Influenza A By PCR: NEGATIVE
Influenza B By PCR: NEGATIVE

## 2017-03-01 LAB — BRAIN NATRIURETIC PEPTIDE: B Natriuretic Peptide: 14.8 pg/mL (ref 0.0–100.0)

## 2017-03-01 LAB — I-STAT TROPONIN, ED: Troponin i, poc: 0.01 ng/mL (ref 0.00–0.08)

## 2017-03-01 MED ORDER — METHYLPREDNISOLONE SODIUM SUCC 125 MG IJ SOLR
125.0000 mg | Freq: Once | INTRAMUSCULAR | Status: AC
  Administered 2017-03-01: 125 mg via INTRAVENOUS
  Filled 2017-03-01: qty 2

## 2017-03-01 MED ORDER — ALBUTEROL SULFATE (2.5 MG/3ML) 0.083% IN NEBU
5.0000 mg | INHALATION_SOLUTION | Freq: Once | RESPIRATORY_TRACT | Status: AC
  Administered 2017-03-01: 5 mg via RESPIRATORY_TRACT
  Filled 2017-03-01: qty 6

## 2017-03-01 MED ORDER — IOPAMIDOL (ISOVUE-370) INJECTION 76%
INTRAVENOUS | Status: AC
  Filled 2017-03-01: qty 100

## 2017-03-01 MED ORDER — SODIUM CHLORIDE 0.9 % IJ SOLN
INTRAMUSCULAR | Status: AC
  Filled 2017-03-01: qty 50

## 2017-03-01 MED ORDER — IPRATROPIUM BROMIDE 0.02 % IN SOLN
0.5000 mg | Freq: Once | RESPIRATORY_TRACT | Status: AC
  Administered 2017-03-01: 0.5 mg via RESPIRATORY_TRACT
  Filled 2017-03-01: qty 2.5

## 2017-03-01 MED ORDER — IOPAMIDOL (ISOVUE-370) INJECTION 76%
100.0000 mL | Freq: Once | INTRAVENOUS | Status: AC | PRN
  Administered 2017-03-01: 100 mL via INTRAVENOUS

## 2017-03-01 NOTE — Progress Notes (Signed)
PT. NOW ON ROOM AIR WITH SATURATIONS 99%.  GOOD AIR FLOW NOTED WITH NO EXPIRATORY WHEEZING.  BBS MUCH IMPROVED.

## 2017-03-01 NOTE — ED Triage Notes (Addendum)
States hx of pneumonia 2-3 weeks ago and today complains of shortness of breath with productive cough unknown color. Able to speak in full sentences on O2 @ 4L all times.

## 2017-03-01 NOTE — ED Notes (Signed)
Bed: WA20 Expected date:  Expected time:  Means of arrival:  Comments: 

## 2017-03-01 NOTE — ED Provider Notes (Signed)
[facility] DEPT Provider Note   CSN: [id number] Arrival date & time: 03/01/17  1849     History   Chief Complaint Chief Complaint  Patient presents with  . Shortness of Breath    HPI  [name] is a 57 y.o. Male with an extensive PMH including COPD, stage IV adenocarcinoma of the lung, chronic anemia, hypertension, who presents to the ED for evaluation of shortness of breath. Pt reports this evening he started having worsening shortness of breath, he has home oxygen for use as needed, but reports he rarely uses it, but put it on when symptoms started, initially he felt the oxygen was helping, but he reports his breathing continued to get worse so he came to ED for evaluation. Since onset symptoms have been constant and gradually worsening.  Pt reports increased coughing for the past few days with occasional sputum production, no hemoptysis.  Reports subjective fevers and chills but has not taken his temperature.  Pt reports some mild chest discomfort, primarily when coughing. Pt reports some mild ankle swelling, which is bilateral and non-painful. Pt reports he has been compliant with his COPD meds. Pt reports some chronic abdominal pain which is unchanged from baseline, denies and nausea, vomiting or diarrhea. Pt recently admitted for pneumonia with COPD exacerbation, reports he had been improving since discharge  until breathing worsened  Tonight.      Past Medical History:  Diagnosis Date  . Abdominal pain 06/04/2016  . Adenocarcinoma of left lung, stage 4 ([place]) 05/02/2016  . Bronchitis due to tobacco use ([place])   . Cancer ([place])    Lung  . Dehydration 06/04/2016  . Encounter for antineoplastic chemotherapy 05/02/2016  . Goals of care, counseling/discussion 05/02/2016  . HTN (hypertension) 10/30/2016  . Recurrent upper respiratory infection (URI)   . Seizures ([place]) 05/2011   new onset  . Shortness of breath     Patient Active Problem List   Diagnosis Date Noted  . Anemia 02/07/2017  . Severe malnutrition ([place]) 01/03/2017  . DOE (dyspnea on exertion) 12/30/2016  . Nausea   . Hypervolemia   . HCAP (healthcare-associated pneumonia) 12/19/2016  . SOB (shortness of breath) 12/19/2016  . Erosive gastropathy 12/18/2016  . Gastritis 12/18/2016  . Hematemesis 12/16/2016  . Intractable nausea and vomiting 12/16/2016  . HTN (hypertension) 10/30/2016  . Chronic obstructive pulmonary disease ([place]) 09/02/2016  . COPD (chronic obstructive pulmonary disease) ([place]) 08/19/2016  . Dehydration 06/04/2016  . Abdominal pain 06/04/2016  . 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 Adenocarcinoma of left lung, stage 4 ([place]) 05/02/2016  . Goals of care, counseling/discussion 05/02/2016  . Encounter for antineoplastic chemotherapy 05/02/2016  . Tobacco abuse 05/30/2011  . Alcohol abuse 05/30/2011  . Seizure ([place]) 05/28/2011    Past Surgical History:  Procedure Laterality Date  . ESOPHAGOGASTRODUODENOSCOPY (EGD) WITH PROPOFOL N/A 12/17/2016   Procedure: ESOPHAGOGASTRODUODENOSCOPY (EGD) WITH PROPOFOL;  Surgeon: Ladene Artist, MD;  Location: [facility] ENDOSCOPY;  Service: Endoscopy;  Laterality: N/A;  . IR FLUORO GUIDE PORT INSERTION RIGHT  05/13/2016  . IR US GUIDE VASC ACCESS RIGHT  05/13/2016  . NO PAST SURGERIES         Home Medications    Prior to Admission medications   Medication Sig Start Date End Date Taking? Authorizing Provider  acetaminophen (TYLENOL) 500 MG tablet Take 500 mg by mouth 2 (two) times daily as needed for mild pain or headache.    [provider]  bisoprolol (ZEBETA) 5 MG  tablet Take 1 tablet (5 mg total) by mouth daily. 02/16/17   Florencia Reasons, MD  Chlorphen-Phenyleph-ASA (ALKA-SELTZER PLUS COLD PO) Take 2 tablets by mouth at bedtime as needed (COUGH).    [provider]  dexamethasone (DECADRON) 4 MG tablet Take 4 mg by mouth as directed. Take 1 tablet twice daily the day before, the day of and the day after  chemotherapy every 3 weeks.    [provider]  furosemide (LASIX) 40 MG tablet Take 1 tablet (40 mg total) by mouth every three (3) days as needed for fluid or edema. 02/16/17 03/18/17  Florencia Reasons, MD  guaiFENesin (MUCINEX) 600 MG 12 hr tablet Take 1 tablet (600 mg total) by mouth 2 (two) times daily. 02/16/17   Florencia Reasons, MD  Ipratropium-Albuterol (COMBIVENT) 20-100 MCG/ACT AERS respimat Inhale 1 puff into the lungs every 6 (six) hours. 07/22/16   Curt Bears, MD  mometasone-formoterol [facility]) 100-5 MCG/ACT AERO Inhale 2 puffs into the lungs 2 (two) times daily. 12/24/16   Eugenie Filler, MD  ondansetron (ZOFRAN-ODT) 4 MG disintegrating tablet Take 4 mg by mouth every 8 (eight) hours as needed for nausea or vomiting.    [provider]  pantoprazole (PROTONIX) 40 MG tablet Take 1 tablet (40 mg total) by mouth 2 (two) times daily. 12/18/16   Eugenie Filler, MD  predniSONE (DELTASONE) 10 MG tablet Label  & dispense according to the schedule below. 4 Pills PO on day one then, 3 Pills PO on day two, 2 Pills PO on day three, 1Pills PO on day four,  then STOP.  Total of 10 tabs 02/16/17   Florencia Reasons, MD  predniSONE (DELTASONE) 20 MG tablet Take 3 tablets (60 mg total) by mouth daily for 4 days. 03/02/17 03/06/17  Jacqlyn Larsen, PA-C  senna-docusate (SENOKOT-S) 8.6-50 MG tablet Take 1 tablet by mouth at bedtime. 02/16/17   Florencia Reasons, MD    Family History Family History  Problem Relation Age of Onset  . Cancer Father   . Diabetes Mellitus II Mother     Social History Social History   Tobacco Use  . Smoking status: Current Some Day Smoker    Packs/day: 0.10    Years: 30.00    Pack years: 3.00    Types: Cigarettes  . Smokeless tobacco: Never Used  Substance Use Topics  . Alcohol use: No    Frequency: Never  . Drug use: No     Allergies   Patient has no known allergies.   Review of Systems Review of Systems  Constitutional: Positive for chills and fever  (Subjective).  HENT: Negative for congestion, rhinorrhea and sore throat.   Eyes: Negative for visual disturbance.  Respiratory: Positive for cough, chest tightness, shortness of breath and wheezing.   Cardiovascular: Positive for chest pain. Negative for palpitations and leg swelling.  Gastrointestinal: Positive for abdominal pain (Chronic). Negative for nausea and vomiting.  Genitourinary: Negative for dysuria and frequency.  Musculoskeletal: Negative for arthralgias and myalgias.  Skin: Negative for color change and rash.  Neurological: Negative for dizziness and light-headedness.     Physical Exam Updated Vital Signs BP 139/88 (BP Location: Left Arm)   Pulse 97   Temp 97.6 F (36.4 C) (Oral)   Resp 20   Ht 6' (1.829 m)   Wt 54.4 kg (120 lb)   SpO2 97%   BMI 16.27 kg/m   Physical Exam  Constitutional:  Non-toxic appearance. No distress.  Chronically ill appearing but in  no acute distress  HENT:  Head: Normocephalic and atraumatic.  Mouth/Throat: Oropharynx is clear and moist.  Neck: Neck supple.  Cardiovascular: Normal rate, regular rhythm, normal heart sounds and intact distal pulses.  Pulmonary/Chest: No respiratory distress.  Patient on 4 L nasal cannula on arrival, Mild increased work of breathing patient able to speak in full sentences, lungs with diffuse expiratory wheeze and decreased air movement throughout  Abdominal: Soft. Bowel sounds are normal.  Mild upper abdominal tenderness without guarding, this is been present for the past several months and is unchanged, no lower abdominal tenderness or peritoneal signs  Musculoskeletal:  1+ edema over bilateral ankles, no tenderness  Neurological: He is alert. Coordination normal.  Moving all extremities without difficulty  Skin: Skin is warm and dry. Capillary refill takes less than 2 seconds.  Psychiatric: He has a normal mood and affect. His behavior is normal.  Nursing note and vitals reviewed.    ED  Treatments / Results  Labs (all labs ordered are listed, but only abnormal results are displayed) Labs Reviewed  CBC WITH DIFFERENTIAL/PLATELET - Abnormal; Notable for the following components:      Result Value   WBC 12.5 (*)    RBC 2.91 (*)    Hemoglobin 10.0 (*)    HCT 29.9 (*)    MCV 102.7 (*)    MCH 34.4 (*)    RDW 18.4 (*)    Platelets 148 (*)    Neutro Abs 10.5 (*)    All other components within normal limits  COMPREHENSIVE METABOLIC PANEL - Abnormal; Notable for the following components:   Creatinine, Ser 1.61 (*)    Albumin 3.3 (*)    ALT 14 (*)    Alkaline Phosphatase 144 (*)    GFR calc non Af Amer 46 (*)    GFR calc Af Amer 53 (*)    All other components within normal limits  BRAIN NATRIURETIC PEPTIDE  INFLUENZA PANEL BY PCR (TYPE A & B)  I-STAT TROPONIN, ED    EKG  EKG Interpretation  Date/Time:  Saturday March 01 2017 20:40:30 EST Ventricular Rate:  104 PR Interval:  136 QRS Duration: 76 QT Interval:  356 QTC Calculation: 468 R Axis:   78 Text Interpretation:  Sinus tachycardia Anterior infarct , age undetermined Abnormal ECG No significant change since last tracing Confirmed by Wandra Arthurs [phone]) on 03/01/2017 10:13:55 PM       Radiology Dg Chest 2 View  Result Date: 03/01/2017 CLINICAL DATA:  Shortness of breath, cough, history adenocarcinoma of the LEFT lung, hypertension EXAM: CHEST  2 VIEW COMPARISON:  02/14/2017 FINDINGS: RIGHT jugular Port-A-Cath with tip projecting over cavoatrial junction. Normal heart size, mediastinal contours, and pulmonary vascularity. Emphysematous and minimal bronchitic changes consistent with COPD. Persistent focal opacity at LEFT base question residual infiltrate versus atelectasis. Remaining lungs grossly clear. No pleural effusion or pneumothorax. Bones unremarkable. IMPRESSION: COPD changes with minimal residual atelectasis or infiltrate at LEFT base. Electronically Signed   By: Lavonia Dana M.D.   On: 03/01/2017 20:58    Ct Angio Chest Pe W And/or Wo Contrast  Result Date: 03/02/2017 CLINICAL DATA:  Subacute onset of shortness of breath and productive cough. Leukocytosis. Current history of non-small cell lung cancer on chemotherapy. EXAM: CT ANGIOGRAPHY CHEST WITH CONTRAST TECHNIQUE: Multidetector CT imaging of the chest was performed using the standard protocol during bolus administration of intravenous contrast. Multiplanar CT image reconstructions and MIPs were obtained to evaluate the vascular anatomy. CONTRAST:  122mL  ISOVUE-370 IOPAMIDOL (ISOVUE-370) INJECTION 76% COMPARISON:  Chest radiograph performed earlier today at 8:49 p.m., and CT of the chest performed 02/14/2017 FINDINGS: Cardiovascular:  There is no evidence of pulmonary embolus. The heart is normal in size. The thoracic aorta is grossly unremarkable. The great vessels are within normal limits. Mediastinum/Nodes: The mediastinum is grossly unremarkable in appearance. Diffuse esophageal wall thickening may reflect esophagitis or chronic inflammation. No pericardial effusion is identified. The thyroid gland is unremarkable. No axillary lymphadenopathy is seen. A right-sided chest port is noted ending about the distal SVC. Lungs/Pleura: A small right pleural effusion is noted, similar in appearance to the prior study. Scarring is again noted at the left lung base. Soft tissue density extending about the left upper lobe is relatively stable in appearance. No pneumothorax is seen. No new masses are identified. Upper Abdomen: The nodular contour of the liver raises concern for hepatic cirrhosis. The unusual hypoattenuated appearance of the central liver is grossly unchanged. A focal fluid collection superior to the proximal body of the pancreas may reflect a pseudocyst. The visualized portions of the liver are unremarkable. Musculoskeletal: No acute osseous abnormalities are identified. Sclerosis is again noted at vertebral body T12. The visualized musculature is  unremarkable in appearance. Review of the MIP images confirms the above findings. IMPRESSION: 1. No evidence of pulmonary embolus. 2. Small right pleural effusion is similar in appearance to the prior study. 3. Relatively stable appearance to soft tissue density extending about the left upper lung lobe, reflecting previously treated malignancy. 4. Diffuse esophageal wall thickening may reflect esophagitis or chronic inflammation. 5. Findings of hepatic cirrhosis. Unusual hypoattenuated appearance of the central liver is stable in appearance. 6. Focal fluid collection superior to the proximal body of the pancreas may reflect a pseudocyst, similar in appearance to the prior study. Would correlate with pancreatic lab values. Electronically Signed   By: Garald Balding M.D.   On: 03/02/2017 00:36    Procedures Procedures (including critical care time)  Medications Ordered in ED Medications  iopamidol (ISOVUE-370) 76 % injection (not administered)  sodium chloride 0.9 % injection (not administered)  albuterol (PROVENTIL HFA;VENTOLIN HFA) 108 (90 Base) MCG/ACT inhaler 2 puff (not administered)  albuterol (PROVENTIL) (2.5 MG/3ML) 0.083% nebulizer solution 5 mg (5 mg Nebulization Given 03/01/17 2019)  albuterol (PROVENTIL) (2.5 MG/3ML) 0.083% nebulizer solution 5 mg (5 mg Nebulization Given 03/01/17 2143)  ipratropium (ATROVENT) nebulizer solution 0.5 mg (0.5 mg Nebulization Given 03/01/17 2143)  methylPREDNISolone sodium succinate (SOLU-MEDROL) 125 mg/2 mL injection 125 mg (125 mg Intravenous Given 03/01/17 2209)  albuterol (PROVENTIL) (2.5 MG/3ML) 0.083% nebulizer solution 5 mg (5 mg Nebulization Given 03/01/17 2302)  ipratropium (ATROVENT) nebulizer solution 0.5 mg (0.5 mg Nebulization Given 03/01/17 2302)  iopamidol (ISOVUE-370) 76 % injection 100 mL (100 mLs Intravenous Contrast Given 03/01/17 2349)  heparin lock flush 100 unit/mL (500 Units Intracatheter Given 03/02/17 0109)     Initial Impression / Assessment  and Plan / ED Course  I have reviewed the triage vital signs and the nursing notes.  Pertinent labs & imaging results that were available during my care of the patient were reviewed by me and considered in my medical decision making (see chart for details).  Patient presents for evaluation of worsening shortness of breath, history of COPD as well as diastolic CHF and stage IV lung cancer, patient has home oxygen as needed.  On arrival vitals are normal patient is chronically ill-appearing but in no acute distress.  Mild increased respiratory effort, but  no tachypnea or hypox on 4 L nasal cannula, patient able to speak in full sentences, Diffuse wheezes and decreased air movement on auscultation.  Will get chest x-ray, labs, flu swab and EKG, and start patient on duo nebs and steroids.  Presentation concerning for COPD exacerbation, given recent pneumonia also need to ensure this ha will check BNP s not worsened.  Patient does have history of CHF, but feel CHF exacerbation is less likely given presentation and lung exam, to help rule this out.  PE cannot be excluded from the differential, although patient has had several chest CTs in the past, skin cancer, patient is at increased risk, will consider CT Angio of the chest if patient not improving with treatment.  Story not suggestive of ACS, patient with only intermittent chest pain, primarily related to coughing.  CBC shows white count of 12.5, which is vastly improved from labs just prior to discharge, where white count was 25.4, hemoglobin at baseline.  No electrolyte derangements requiring intervention, kidney function appears to be at baseline, as is liver function.  Troponin negative and EKG without significant changes when compared to previous.  BNP is not elevated to suggest any component of CHF exacerbation.  Chest x-ray shows chronic COPD changes, with minimal residual left infiltrate versus atelectasis, improved when compared to prior.  On  reevaluation after initial DuoNeb and steroids, patient showing minimal improvement, will order additional DuoNeb and then reevaluate, vitals remained stable without tachypnea or hypoxia on 4 L nasal cannula.  After second DuoNeb patient showing some continued improvement, but still endorses some shortness of breath, vitals remained stable, patient discussed with Dr.Yao who saw and evaluated the patient as well.  Will get CT Angio of the chest to rule out PE, if CT without any significant findings, patient can likely be discharged home with continued steroids and bronchodilators.   Respiratory therapy evaluated the patient, patient maintaining O2 saturations at 99% on room air without any oxygen, with normal respiratory effort, lungs are clear with good air movement in all wheezing seems to have resolved.  Chest CT without any evidence of pulmonary embolism, Stable small right pleural effusion, no other acute findings.  Discussed these results with the patient.  At this time.  He is stable for discharge back to his assisted living facility with 4 days of prednisone, as well as continued bronchodilators, patient provided albuterol inhaler here in the emergency department.  Discussed with patient that he may need his PRN oxygen more than usual until this exacerbation improves.  Patient to follow-up with his primary care doctor.  Strict return precautions discussed.  [facility] contacted for transportation back to ALF.  Patient discussed with Dr. Darl Householder, who saw patient as well and agrees with plan.   Final Clinical Impressions(s) / ED Diagnoses   Final diagnoses:  COPD exacerbation ([place])  SOB (shortness of breath)    ED Discharge Orders        Ordered    predniSONE (DELTASONE) 20 MG tablet  Daily     03/02/17 0045       Jacqlyn Larsen, PA-C 03/02/17 0226    Drenda Freeze, MD 03/05/17 [phone]

## 2017-03-01 NOTE — ED Notes (Signed)
Patient transported to X-ray 

## 2017-03-02 MED ORDER — HEPARIN SOD (PORK) LOCK FLUSH 100 UNIT/ML IV SOLN
500.0000 [IU] | Freq: Once | INTRAVENOUS | Status: AC
  Administered 2017-03-02: 500 [IU]
  Filled 2017-03-02: qty 5

## 2017-03-02 MED ORDER — ALBUTEROL SULFATE HFA 108 (90 BASE) MCG/ACT IN AERS: 2.0000 | INHALATION_SPRAY | Freq: Once | RESPIRATORY_TRACT | Status: DC

## 2017-03-02 MED ORDER — PREDNISONE 20 MG PO TABS: 60.0000 mg | ORAL_TABLET | Freq: Every day | ORAL | 0 refills | Status: AC

## 2017-03-02 NOTE — Discharge Instructions (Signed)
Your shortness of breath is likely due to an exacerbation of your COPD, please take steroids as directed for the next 4 days, you may use your albuterol inhaler every 4 hours for the next 24 hours and then as needed in addition to your chronic home inhalers.  Will likely need to use your home oxygen more than her use to until this exacerbation improves.  Your evaluation overall today is reassuring, your breathing has improved with treatment, chest x-ray shows no progression of the pneumonia, and CT scan shows no evidence of blood clot or other concerning findings in the lung.  Please follow-up with your primary care doctor next week.  If you have worsening shortness of breath and requiring more oxygen, feel you are working harder to breathe, having chest pain, persistent fevers or chills or other new or concerning symptoms please return to the ED.

## 2017-03-09 ENCOUNTER — Emergency Department (HOSPITAL_COMMUNITY): Payer: Medicaid Other

## 2017-03-09 ENCOUNTER — Encounter (HOSPITAL_COMMUNITY): Payer: Self-pay

## 2017-03-09 ENCOUNTER — Inpatient Hospital Stay (HOSPITAL_COMMUNITY)
Admission: EM | Admit: 2017-03-09 | Discharge: 2017-03-19 | DRG: 190 | Disposition: A | Discharge status: Home Health | Payer: Medicaid Other | Attending: Internal Medicine | Admitting: Internal Medicine

## 2017-03-09 ENCOUNTER — Other Ambulatory Visit: Payer: Self-pay

## 2017-03-09 DIAGNOSIS — N183 Chronic kidney disease, stage 3 unspecified: Secondary | ICD-10-CM

## 2017-03-09 DIAGNOSIS — J9621 Acute and chronic respiratory failure with hypoxia: Secondary | ICD-10-CM | POA: Diagnosis present

## 2017-03-09 DIAGNOSIS — Z833 Family history of diabetes mellitus: Secondary | ICD-10-CM

## 2017-03-09 DIAGNOSIS — I129 Hypertensive chronic kidney disease with stage 1 through stage 4 chronic kidney disease, or unspecified chronic kidney disease: Secondary | ICD-10-CM | POA: Diagnosis present

## 2017-03-09 DIAGNOSIS — C7951 Secondary malignant neoplasm of bone: Secondary | ICD-10-CM | POA: Diagnosis present

## 2017-03-09 DIAGNOSIS — I10 Essential (primary) hypertension: Secondary | ICD-10-CM | POA: Diagnosis not present

## 2017-03-09 DIAGNOSIS — F17210 Nicotine dependence, cigarettes, uncomplicated: Secondary | ICD-10-CM | POA: Diagnosis present

## 2017-03-09 DIAGNOSIS — J449 Chronic obstructive pulmonary disease, unspecified: Secondary | ICD-10-CM | POA: Diagnosis present

## 2017-03-09 DIAGNOSIS — Z9981 Dependence on supplemental oxygen: Secondary | ICD-10-CM

## 2017-03-09 DIAGNOSIS — D72829 Elevated white blood cell count, unspecified: Secondary | ICD-10-CM | POA: Diagnosis present

## 2017-03-09 DIAGNOSIS — R569 Unspecified convulsions: Secondary | ICD-10-CM

## 2017-03-09 DIAGNOSIS — C3490 Malignant neoplasm of unspecified part of unspecified bronchus or lung: Secondary | ICD-10-CM

## 2017-03-09 DIAGNOSIS — J90 Pleural effusion, not elsewhere classified: Secondary | ICD-10-CM

## 2017-03-09 DIAGNOSIS — I5030 Unspecified diastolic (congestive) heart failure: Secondary | ICD-10-CM | POA: Diagnosis not present

## 2017-03-09 DIAGNOSIS — Z720 Tobacco use: Secondary | ICD-10-CM | POA: Diagnosis present

## 2017-03-09 DIAGNOSIS — C3492 Malignant neoplasm of unspecified part of left bronchus or lung: Secondary | ICD-10-CM | POA: Diagnosis present

## 2017-03-09 DIAGNOSIS — Z809 Family history of malignant neoplasm, unspecified: Secondary | ICD-10-CM

## 2017-03-09 DIAGNOSIS — J441 Chronic obstructive pulmonary disease with (acute) exacerbation: Principal | ICD-10-CM

## 2017-03-09 DIAGNOSIS — R0689 Other abnormalities of breathing: Secondary | ICD-10-CM | POA: Diagnosis not present

## 2017-03-09 DIAGNOSIS — Z923 Personal history of irradiation: Secondary | ICD-10-CM | POA: Diagnosis not present

## 2017-03-09 DIAGNOSIS — E440 Moderate protein-calorie malnutrition: Secondary | ICD-10-CM | POA: Diagnosis present

## 2017-03-09 DIAGNOSIS — R0602 Shortness of breath: Secondary | ICD-10-CM

## 2017-03-09 DIAGNOSIS — T380X5A Adverse effect of glucocorticoids and synthetic analogues, initial encounter: Secondary | ICD-10-CM | POA: Diagnosis present

## 2017-03-09 DIAGNOSIS — G8929 Other chronic pain: Secondary | ICD-10-CM | POA: Diagnosis present

## 2017-03-09 DIAGNOSIS — Z9221 Personal history of antineoplastic chemotherapy: Secondary | ICD-10-CM | POA: Diagnosis not present

## 2017-03-09 DIAGNOSIS — K2970 Gastritis, unspecified, without bleeding: Secondary | ICD-10-CM | POA: Diagnosis present

## 2017-03-09 DIAGNOSIS — Z7189 Other specified counseling: Secondary | ICD-10-CM | POA: Diagnosis not present

## 2017-03-09 DIAGNOSIS — R0600 Dyspnea, unspecified: Secondary | ICD-10-CM | POA: Diagnosis not present

## 2017-03-09 DIAGNOSIS — Z515 Encounter for palliative care: Secondary | ICD-10-CM | POA: Diagnosis not present

## 2017-03-09 LAB — CBC WITH DIFFERENTIAL/PLATELET
BASOS PCT: 1 %
Basophils Absolute: 0.1 10*3/uL (ref 0.0–0.1)
Eosinophils Absolute: 0.1 10*3/uL (ref 0.0–0.7)
Eosinophils Relative: 1 %
HCT: 33.7 % — ABNORMAL LOW (ref 39.0–52.0)
Hemoglobin: 11 g/dL — ABNORMAL LOW (ref 13.0–17.0)
Lymphocytes Relative: 7 %
Lymphs Abs: 0.9 10*3/uL (ref 0.7–4.0)
MCH: 34.3 pg — AB (ref 26.0–34.0)
MCHC: 32.6 g/dL (ref 30.0–36.0)
MCV: 105 fL — ABNORMAL HIGH (ref 78.0–100.0)
MONO ABS: 0.7 10*3/uL (ref 0.1–1.0)
Monocytes Relative: 5 %
NEUTROS ABS: 11.4 10*3/uL — AB (ref 1.7–7.7)
NEUTROS PCT: 86 %
PLATELETS: 289 10*3/uL (ref 150–400)
RBC: 3.21 MIL/uL — ABNORMAL LOW (ref 4.22–5.81)
RDW: 18.3 % — ABNORMAL HIGH (ref 11.5–15.5)
WBC: 13.2 10*3/uL — ABNORMAL HIGH (ref 4.0–10.5)

## 2017-03-09 LAB — COMPREHENSIVE METABOLIC PANEL
ALT: 23 U/L (ref 17–63)
ANION GAP: 12 (ref 5–15)
AST: 27 U/L (ref 15–41)
Albumin: 3.6 g/dL (ref 3.5–5.0)
Alkaline Phosphatase: 159 U/L — ABNORMAL HIGH (ref 38–126)
BUN: 25 mg/dL — ABNORMAL HIGH (ref 6–20)
CHLORIDE: 104 mmol/L (ref 101–111)
CO2: 25 mmol/L (ref 22–32)
Calcium: 9.7 mg/dL (ref 8.9–10.3)
Creatinine, Ser: 1.72 mg/dL — ABNORMAL HIGH (ref 0.61–1.24)
GFR calc non Af Amer: 42 mL/min — ABNORMAL LOW (ref >60)
GFR, EST AFRICAN AMERICAN: 49 mL/min — AB (ref >60)
Glucose, Bld: 126 mg/dL — ABNORMAL HIGH (ref 65–99)
POTASSIUM: 4.1 mmol/L (ref 3.5–5.1)
SODIUM: 141 mmol/L (ref 135–145)
Total Bilirubin: 0.4 mg/dL (ref 0.3–1.2)
Total Protein: 7.6 g/dL (ref 6.5–8.1)

## 2017-03-09 LAB — BRAIN NATRIURETIC PEPTIDE: B Natriuretic Peptide: 28.6 pg/mL (ref 0.0–100.0)

## 2017-03-09 LAB — BLOOD GAS, ARTERIAL
Acid-Base Excess: 0.8 mmol/L (ref 0.0–2.0)
Bicarbonate: 25.6 mmol/L (ref 20.0–28.0)
DRAWN BY: 103701
O2 CONTENT: 3 L/min
O2 SAT: 95.2 %
PATIENT TEMPERATURE: 99.4
pCO2 arterial: 45.3 mmHg (ref 32.0–48.0)
pH, Arterial: 7.373 (ref 7.350–7.450)
pO2, Arterial: 84.5 mmHg (ref 83.0–108.0)

## 2017-03-09 LAB — TROPONIN I: Troponin I: <0.03 ng/mL (ref <0.03)

## 2017-03-09 MED ORDER — OXYCODONE HCL 5 MG PO TABS: 5.0000 mg | ORAL_TABLET | ORAL | Status: DC | PRN

## 2017-03-09 MED ORDER — PREDNISONE 20 MG PO TABS: 40.0000 mg | ORAL_TABLET | Freq: Every day | ORAL | Status: DC

## 2017-03-09 MED ORDER — ONDANSETRON HCL 4 MG/2ML IJ SOLN: 4.0000 mg | Freq: Four times a day (QID) | INTRAMUSCULAR | Status: DC | PRN

## 2017-03-09 MED ORDER — SENNOSIDES-DOCUSATE SODIUM 8.6-50 MG PO TABS
1.0000 | ORAL_TABLET | Freq: Every day | ORAL | Status: DC
  Administered 2017-03-10 – 2017-03-18 (×8): 1 via ORAL
  Filled 2017-03-09 (×9): qty 1

## 2017-03-09 MED ORDER — ACETAMINOPHEN 325 MG PO TABS: 650.0000 mg | ORAL_TABLET | Freq: Four times a day (QID) | ORAL | Status: DC | PRN

## 2017-03-09 MED ORDER — LORATADINE 10 MG PO TABS
10.0000 mg | ORAL_TABLET | Freq: Every day | ORAL | Status: DC
  Administered 2017-03-10 – 2017-03-19 (×10): 10 mg via ORAL
  Filled 2017-03-09 (×10): qty 1

## 2017-03-09 MED ORDER — METHYLPREDNISOLONE SODIUM SUCC 40 MG IJ SOLR
40.0000 mg | Freq: Four times a day (QID) | INTRAMUSCULAR | Status: AC
  Administered 2017-03-10 (×4): 40 mg via INTRAVENOUS
  Filled 2017-03-09 (×4): qty 1

## 2017-03-09 MED ORDER — AMLODIPINE BESYLATE 10 MG PO TABS
10.0000 mg | ORAL_TABLET | Freq: Every day | ORAL | Status: DC
  Administered 2017-03-10 – 2017-03-19 (×10): 10 mg via ORAL
  Filled 2017-03-09 (×10): qty 1

## 2017-03-09 MED ORDER — LEVALBUTEROL HCL 0.63 MG/3ML IN NEBU
0.6300 mg | INHALATION_SOLUTION | Freq: Once | RESPIRATORY_TRACT | Status: AC
  Administered 2017-03-09: 0.63 mg via RESPIRATORY_TRACT
  Filled 2017-03-09: qty 3

## 2017-03-09 MED ORDER — MOMETASONE FURO-FORMOTEROL FUM 100-5 MCG/ACT IN AERO
2.0000 | INHALATION_SPRAY | Freq: Two times a day (BID) | RESPIRATORY_TRACT | Status: DC
  Administered 2017-03-10 – 2017-03-12 (×5): 2 via RESPIRATORY_TRACT
  Filled 2017-03-09: qty 8.8

## 2017-03-09 MED ORDER — LEVOFLOXACIN IN D5W 750 MG/150ML IV SOLN
750.0000 mg | Freq: Once | INTRAVENOUS | Status: AC
  Administered 2017-03-09: 750 mg via INTRAVENOUS
  Filled 2017-03-09: qty 150

## 2017-03-09 MED ORDER — ACETAMINOPHEN 650 MG RE SUPP: 650.0000 mg | Freq: Four times a day (QID) | RECTAL | Status: DC | PRN

## 2017-03-09 MED ORDER — GUAIFENESIN ER 600 MG PO TB12
600.0000 mg | ORAL_TABLET | Freq: Two times a day (BID) | ORAL | Status: DC
  Administered 2017-03-10 – 2017-03-16 (×14): 600 mg via ORAL
  Filled 2017-03-09 (×14): qty 1

## 2017-03-09 MED ORDER — ONDANSETRON 4 MG PO TBDP: 4.0000 mg | ORAL_TABLET | Freq: Three times a day (TID) | ORAL | Status: DC | PRN

## 2017-03-09 MED ORDER — DOXYCYCLINE HYCLATE 100 MG PO TABS
100.0000 mg | ORAL_TABLET | Freq: Two times a day (BID) | ORAL | Status: DC
  Administered 2017-03-10 – 2017-03-16 (×14): 100 mg via ORAL
  Filled 2017-03-09 (×14): qty 1

## 2017-03-09 MED ORDER — ENOXAPARIN SODIUM 40 MG/0.4ML ~~LOC~~ SOLN: 40.0000 mg | SUBCUTANEOUS | Status: DC

## 2017-03-09 MED ORDER — POLYETHYLENE GLYCOL 3350 17 G PO PACK
17.0000 g | PACK | Freq: Every day | ORAL | Status: DC | PRN
  Filled 2017-03-09: qty 1

## 2017-03-09 MED ORDER — MORPHINE SULFATE 15 MG PO TABS
15.0000 mg | ORAL_TABLET | ORAL | Status: DC | PRN
  Administered 2017-03-10 – 2017-03-18 (×17): 15 mg via ORAL
  Filled 2017-03-09 (×20): qty 1

## 2017-03-09 MED ORDER — IPRATROPIUM-ALBUTEROL 0.5-2.5 (3) MG/3ML IN SOLN
3.0000 mL | RESPIRATORY_TRACT | Status: DC
  Administered 2017-03-10 – 2017-03-11 (×9): 3 mL via RESPIRATORY_TRACT
  Filled 2017-03-09 (×9): qty 3

## 2017-03-09 MED ORDER — PANTOPRAZOLE SODIUM 40 MG PO TBEC
40.0000 mg | DELAYED_RELEASE_TABLET | Freq: Two times a day (BID) | ORAL | Status: DC
  Administered 2017-03-10 – 2017-03-19 (×20): 40 mg via ORAL
  Filled 2017-03-09 (×20): qty 1

## 2017-03-09 NOTE — ED Notes (Signed)
ED TO INPATIENT HANDOFF REPORT  Name/Age/Gender [name] 57 y.o. male  Code Status Code Status History    Date Active Date Inactive Code Status Order ID Comments User Context   02/14/2017 09:02 02/16/2017 14:55 Full Code [id number]  Florencia Reasons, MD Inpatient   02/10/2017 00:31 02/14/2017 09:02 DNR [id number]  Norval Morton, MD ED   12/30/2016 23:25 01/07/2017 21:09 DNR [id number]  Elodia [name]., MD Inpatient   12/17/2016 09:34 12/24/2016 18:11 DNR [id number]  Thurnell Lose, MD Inpatient   12/16/2016 16:49 12/17/2016 09:34 Full Code [id number]  Charlynne Cousins, MD Inpatient      Home/SNF/Other Skilled nursing facility  Chief Complaint shortness of breath  Level of Care/Admitting Diagnosis ED Disposition    ED Disposition Condition [facility] Area: [facility] [[id number]]  Level of Care: Telemetry [5]  Admit to tele based on following criteria: Other see comments  Comments: tachycardi  Diagnosis: COPD with acute exacerbation [facility]) [[id number]]  Admitting Physician: Cristy Folks [[id number]]  Attending Physician: Cristy Folks [[id number]]  Estimated length of stay: past midnight tomorrow  Certification:: I certify this patient will need inpatient services for at least 2 midnights  PT Class (Do Not Modify): Inpatient [101]  PT Acc Code (Do Not Modify): Private [1]       Medical History Past Medical History:  Diagnosis Date  . Abdominal pain 06/04/2016  . Adenocarcinoma of left lung, stage 4 ([place]) 05/02/2016  . Bronchitis due to tobacco use ([place])   . Cancer ([place])    Lung  . Dehydration 06/04/2016  . Encounter for antineoplastic chemotherapy 05/02/2016  . Goals of care, counseling/discussion 05/02/2016  . HTN (hypertension) 10/30/2016  . Recurrent upper respiratory infection (URI)   . Seizures ([place]) 05/2011   new onset  . Shortness of breath     Allergies No Known Allergies  IV Location/Drains/Wounds Patient  Lines/Drains/Airways Status   Active Line/Drains/Airways    Name:   Placement date:   Placement time:   Site:   Days:   Implanted Port 05/13/16 Right Chest   05/13/16    -    Chest   300   Peripheral IV 03/09/17 Left Forearm   03/09/17    -    Forearm   less than 1          Labs/Imaging Results for orders placed or performed during the hospital encounter of 03/09/17 (from the past 48 hour(s))  CBC with Differential/Platelet     Status: Abnormal   Collection Time: 03/09/17  5:19 PM  Result Value Ref Range   WBC 13.2 (H) 4.0 - 10.5 K/uL   RBC 3.21 (L) 4.22 - 5.81 MIL/uL   Hemoglobin 11.0 (L) 13.0 - 17.0 g/dL   HCT 33.7 (L) 39.0 - 52.0 %   MCV 105.0 (H) 78.0 - 100.0 fL   MCH 34.3 (H) 26.0 - 34.0 pg   MCHC 32.6 30.0 - 36.0 g/dL   RDW 18.3 (H) 11.5 - 15.5 %   Platelets 289 150 - 400 K/uL   Neutrophils Relative % 86 %   Lymphocytes Relative 7 %   Monocytes Relative 5 %   Eosinophils Relative 1 %   Basophils Relative 1 %   Neutro Abs 11.4 (H) 1.7 - 7.7 K/uL   Lymphs Abs 0.9 0.7 - 4.0 K/uL   Monocytes Absolute 0.7 0.1 - 1.0 K/uL   Eosinophils Absolute 0.1 0.0 - 0.7 K/uL   Basophils Absolute  0.1 0.0 - 0.1 K/uL   RBC Morphology POLYCHROMASIA PRESENT     Comment: Performed at [facility], [place] [address] [place], [place] [address]  Comprehensive metabolic panel     Status: Abnormal   Collection Time: 03/09/17  5:19 PM  Result Value Ref Range   Sodium 141 135 - 145 mmol/L   Potassium 4.1 3.5 - 5.1 mmol/L   Chloride 104 101 - 111 mmol/L   CO2 25 22 - 32 mmol/L   Glucose, Bld 126 (H) 65 - 99 mg/dL   [name] 25 (H) 6 - 20 mg/dL   Creatinine, Ser 1.72 (H) 0.61 - 1.24 mg/dL   Calcium 9.7 8.9 - 10.3 mg/dL   Total Protein 7.6 6.5 - 8.1 g/dL   Albumin 3.6 3.5 - 5.0 g/dL   AST 27 15 - 41 U/L   ALT 23 17 - 63 U/L   Alkaline Phosphatase 159 (H) 38 - 126 U/L   Total Bilirubin 0.4 0.3 - 1.2 mg/dL   GFR calc non Af Amer 42 (L) >60 mL/min   GFR calc Af Amer 49 (L) >60  mL/min    Comment: (NOTE) The eGFR has been calculated using the CKD EPI equation. This calculation has not been validated in all clinical situations. eGFR's persistently <60 mL/min signify possible Chronic Kidney Disease.    Anion gap 12 5 - 15    Comment: Performed at [facility], [place] [address], [place], [place] [address]  Brain natriuretic peptide     Status: None   Collection Time: 03/09/17  5:19 PM  Result Value Ref Range   B Natriuretic Peptide 28.6 0.0 - 100.0 pg/mL    Comment: Performed at [facility], [place] [address], [place], [place] [address]  Troponin I     Status: None   Collection Time: 03/09/17  5:19 PM  Result Value Ref Range   Troponin I <0.03 <0.03 ng/mL    Comment: Performed at [facility], [place] [address], [place], [place] [address]  Blood gas, arterial ([facility] & [facility] ONLY)     Status: None   Collection Time: 03/09/17  6:55 PM  Result Value Ref Range   O2 Content 3.0 L/min   Delivery systems NASAL CANNULA    pH, Arterial 7.373 7.350 - 7.450   pCO2 arterial 45.3 32.0 - 48.0 mmHg   pO2, Arterial 84.5 83.0 - 108.0 mmHg   Bicarbonate 25.6 20.0 - 28.0 mmol/L   Acid-Base Excess 0.8 0.0 - 2.0 mmol/L   O2 Saturation 95.2 %   Patient temperature 99.4    Collection site BRACHIAL ARTERY    Drawn by [id number]    Sample type ARTERIAL     Comment: Performed at [facility], [place] [address] [place], [place] [address]   Dg Chest Port 1 View  Result Date: 03/09/2017 CLINICAL DATA:  Severe dyspnea. EXAM: PORTABLE CHEST 1 VIEW COMPARISON:  March 01, 2017 FINDINGS: No pneumothorax. Stable right Port-A-Cath. The cardiomediastinal silhouette is unremarkable. Hyperinflation of the lungs. Scarring in the lateral left lung base. No nodules or masses. Emphysematous changes in the lungs. IMPRESSION: Emphysematous changes.  No acute abnormalities. Electronically Signed   By: Dorise Bullion III M.D   On: 03/09/2017  17:46    Pending Labs Unresulted Labs (From admission, onward)   Start     Ordered   Signed and Occupational hygienist morning,   R     Signed  and Held   Signed and Held  CBC  Tomorrow morning,   R     Signed and Held      Vitals/Pain Today's Vitals   03/09/17 1915 03/09/17 1920 03/09/17 2000 03/09/17 2216  BP: (!) 141/81  140/81 131/80  Pulse: (!) 102  97 (!) 101  Resp: (!) 30  (!) 30 (!) 28  Temp:    98.7 F (37.1 C)  TempSrc:    Oral  SpO2: 93% 98% 99% 99%  Weight:      Height:      PainSc:        Isolation Precautions No active isolations  Medications Medications  levalbuterol (XOPENEX) nebulizer solution 0.63 mg (0.63 mg Nebulization Given 03/09/17 1920)  levofloxacin (LEVAQUIN) IVPB 750 mg (750 mg Intravenous New Bag/Given 03/09/17 2111)    Mobility walks with person assist

## 2017-03-09 NOTE — ED Notes (Signed)
Pt's breathing treatment finished, pt placed on 3 L nasal cannula.

## 2017-03-09 NOTE — H&P (Signed)
History and Physical    OTHER ATIENZA WLS:[id number] DOB: 1960-08-20 DOA: 03/09/2017  PCP: Patient, No Pcp Per   Patient coming from: ALF    Chief Complaint: Miss of breath  HPI: [name] is a 57 y.o. 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  Patient is quite a poor historian however patient reports that for the past several days he has had intermittent chills, wheezing intermittent dyspnea on exertion that is progressively worsening, nausea, abdominal distention, upper abdominal pain.  He is required now 2 L chronically and up to 3 L with exertion.  Whereas prior he only needed 2 L intermittently.  He reports a continued dry cough that is worse at night.  He also reports some worsening lower extremity edema, chronic abdominal pain.  He denies any nausea vomiting or diarrhea.  He does not report any objective fevers but does report some rigors.   The patient was admitted and recently discharged on 02/16/2017 with acute on chronic respiratory failure with hypoxia concerning for possible healthcare swab associated pneumonia exacerbation.  He did have a thoracentesis on 02/14/2017 with drainage of fluid and was treated for healthcare associated pneumonia.  He completed a course of oral steroids and Augmentin on discharge. Since then patient was seen in the ED on 03/01/2017 for some shortness of breath and received a PE CT that was negative.  He was noted to be wheezing and sent home on a steroid taper.   ED Course: In the ED patient was noted to be in respiratory distress.  Vitals were notable for significant tachypnea.  Labs  are notable for an elevated white count of 13.2 and a creatinine of 1.72.  Patient was given IV levofloxacin, IV steroids, bronchodilators.  Chest x-ray showed no acute process.  Review of Systems: As per HPI otherwise 10 point review of systems negative.     Past Medical History:  Diagnosis Date  . Abdominal pain 06/04/2016  . Adenocarcinoma of left lung, stage 4 ([place]) 05/02/2016  . Bronchitis due to tobacco use ([place])   . Cancer ([place])    Lung  . Dehydration 06/04/2016  . Encounter for antineoplastic chemotherapy 05/02/2016  . Goals of care, counseling/discussion 05/02/2016  . HTN (hypertension) 10/30/2016  . Recurrent upper respiratory infection (URI)   . Seizures ([place]) 05/2011   new onset  . Shortness of breath     Past Surgical History:  Procedure Laterality Date  . ESOPHAGOGASTRODUODENOSCOPY (EGD) WITH PROPOFOL N/A 12/17/2016   Procedure: ESOPHAGOGASTRODUODENOSCOPY (EGD) WITH PROPOFOL;  Surgeon: Ladene Artist, MD;  Location: [facility];  Service: Endoscopy;  Laterality: N/A;  . IR FLUORO GUIDE PORT INSERTION RIGHT  05/13/2016  . IR US GUIDE VASC ACCESS RIGHT  05/13/2016  . NO PAST SURGERIES       reports that he has been smoking cigarettes.  He has a 3.00 pack-year smoking history. he has never used smokeless tobacco. He reports that he does not drink alcohol or use drugs.  No Known Allergies  Family History  Problem Relation Age of Onset  . Cancer Father   . Diabetes Mellitus II Mother       Prior to Admission medications  Medication Sig Start Date End Date Taking? Authorizing Provider  acetaminophen (TYLENOL) 500 MG tablet Take 500 mg by mouth 2 (two) times daily as needed for mild pain or headache.   Yes [provider]  amLODipine (NORVASC) 10 MG tablet Take 10 mg by mouth daily.   Yes [provider]  bisoprolol (ZEBETA) 5 MG tablet Take 1 tablet (5 mg total) by mouth daily. 02/16/17  Yes Florencia Reasons, MD  Chlorphen-Phenyleph-ASA (ALKA-SELTZER  PLUS COLD PO) Take 2 tablets by mouth at bedtime as needed (COUGH).   Yes [provider]  dexamethasone (DECADRON) 4 MG tablet Take 4 mg by mouth as directed. Take 1 tablet twice daily the day before, the day of and the day after chemotherapy every 3 weeks.   Yes [provider]  furosemide (LASIX) 40 MG tablet Take 1 tablet (40 mg total) by mouth every three (3) days as needed for fluid or edema. 02/16/17 03/18/17 Yes Florencia Reasons, MD  guaiFENesin (MUCINEX) 600 MG 12 hr tablet Take 1 tablet (600 mg total) by mouth 2 (two) times daily. 02/16/17  Yes Florencia Reasons, MD  Ipratropium-Albuterol (COMBIVENT) 20-100 MCG/ACT AERS respimat Inhale 1 puff into the lungs every 6 (six) hours. 07/22/16  Yes Curt Bears, MD  lidocaine-prilocaine (EMLA) cream Apply 1 application topically as needed.   Yes [provider]  loratadine (CLARITIN) 10 MG tablet Take 10 mg by mouth daily.   Yes [provider]  mometasone-formoterol (DULERA) 100-5 MCG/ACT AERO Inhale 2 puffs into the lungs 2 (two) times daily. 12/24/16  Yes Eugenie Filler, MD  morphine (MSIR) 15 MG tablet Take 15 mg by mouth every 4 (four) hours as needed for severe pain.   Yes [provider]  ondansetron (ZOFRAN-ODT) 4 MG disintegrating tablet Take 4 mg by mouth every 8 (eight) hours as needed for nausea or vomiting.   Yes [provider]  pantoprazole (PROTONIX) 40 MG tablet Take 1 tablet (40 mg total) by mouth 2 (two) times daily. 12/18/16  Yes Eugenie Filler, MD  senna-docusate (SENNA-PLUS) 8.6-50 MG tablet Take 1 tablet by mouth at bedtime.   Yes [provider]  predniSONE (DELTASONE) 10 MG tablet Label  & dispense according to the schedule below. 4 Pills PO on day one then, 3 Pills PO on day two, 2 Pills PO on day three, 1Pills PO on day four,  then STOP.  Total of 10 tabs Patient not taking: Reported on 03/09/2017 02/16/17   Florencia Reasons, MD    Physical Exam: Vitals:   03/09/17 1907  03/09/17 1915 03/09/17 1920 03/09/17 2000  BP: 140/85 (!) 141/81  140/81  Pulse: (!) 108 (!) 102  97  Resp: (!) 32 (!) 30  (!) 30  Temp:      TempSrc:      SpO2: 98% 93% 98% 99%  Weight:      Height:        Constitutional: NAD, calm, comfortable Vitals:   03/09/17 1907 03/09/17 1915 03/09/17 1920 03/09/17 2000  BP: 140/85 (!) 141/81  140/81  Pulse: (!) 108 (!) 102  97  Resp: (!) 32 (!) 30  (!) 30  Temp:      TempSrc:      SpO2: 98% 93% 98% 99%  Weight:      Height:       Eyes: Anicteric sclera ENMT: Dry mucous membranes, poor dentition Neck: normal Respiratory: Currently increased work of breathing, accessory muscle use, minimal lung sounds heard except  for severe wheezing, very poor air movement, no crackles or rhonchi Cardiovascular: Tachycardia, distant heart sounds Abdomen: no tenderness, no masses palpated. No hepatosplenomegaly. Bowel sounds positive.  Musculoskeletal: No lower extremity edema Skin: no rashes on visible skin Neurologic: Grossly intact Psychiatric: Normal judgment and insight. Alert and oriented x 3. Normal mood.      Labs on Admission: I have personally reviewed following labs and imaging studies  CBC: Recent Labs  Lab 03/09/17 1719  WBC 13.2*  NEUTROABS 11.4*  HGB 11.0*  HCT 33.7*  MCV 105.0*  PLT 811   Basic Metabolic Panel: Recent Labs  Lab 03/09/17 1719  NA 141  K 4.1  CL 104  CO2 25  GLUCOSE 126*  BUN 25*  CREATININE 1.72*  CALCIUM 9.7   GFR: Estimated Creatinine Clearance: 39.5 mL/min (A) (by C-G formula based on SCr of 1.72 mg/dL (H)). Liver Function Tests: Recent Labs  Lab 03/09/17 1719  AST 27  ALT 23  ALKPHOS 159*  BILITOT 0.4  PROT 7.6  ALBUMIN 3.6   No results for input(s): LIPASE, AMYLASE in the last 168 hours. No results for input(s): AMMONIA in the last 168 hours. Coagulation Profile: No results for input(s): INR, PROTIME in the last 168 hours. Cardiac Enzymes: Recent Labs  Lab 03/09/17 1719    TROPONINI <0.03   BNP (last 3 results) No results for input(s): PROBNP in the last 8760 hours. HbA1C: No results for input(s): HGBA1C in the last 72 hours. CBG: No results for input(s): GLUCAP in the last 168 hours. Lipid Profile: No results for input(s): CHOL, HDL, LDLCALC, TRIG, CHOLHDL, LDLDIRECT in the last 72 hours. Thyroid Function Tests: No results for input(s): TSH, T4TOTAL, FREET4, T3FREE, THYROIDAB in the last 72 hours. Anemia Panel: No results for input(s): VITAMINB12, FOLATE, FERRITIN, TIBC, IRON, RETICCTPCT in the last 72 hours. Urine analysis:    Component Value Date/Time   COLORURINE YELLOW 12/30/2016 2049   APPEARANCEUR CLEAR 12/30/2016 2049   LABSPEC 1.005 12/30/2016 2049   PHURINE 7.0 12/30/2016 2049   GLUCOSEU NEGATIVE 12/30/2016 2049   HGBUR NEGATIVE 12/30/2016 2049   [place] 12/30/2016 2049   [place] NEGATIVE 12/30/2016 2049   PROTEINUR NEGATIVE 12/30/2016 2049   UROBILINOGEN 0.2 05/29/2011 0122   NITRITE NEGATIVE 12/30/2016 2049   LEUKOCYTESUR NEGATIVE 12/30/2016 2049    Radiological Exams on Admission: Dg Chest Port 1 View  Result Date: 03/09/2017 CLINICAL DATA:  Severe dyspnea. EXAM: PORTABLE CHEST 1 VIEW COMPARISON:  March 01, 2017 FINDINGS: No pneumothorax. Stable right Port-A-Cath. The cardiomediastinal silhouette is unremarkable. Hyperinflation of the lungs. Scarring in the lateral left lung base. No nodules or masses. Emphysematous changes in the lungs. IMPRESSION: Emphysematous changes.  No acute abnormalities. Electronically Signed   By: Dorise Bullion III M.D   On: 03/09/2017 17:46    EKG: Independently reviewed.  Sinus tachycardia, significant motion artifact  Assessment/Plan Active Problems:   Seizure (HCC)   Tobacco abuse   Adenocarcinoma of left lung, stage 4 ([facility])   Chronic obstructive pulmonary disease (HCC)   HTN (hypertension)   Gastritis   SOB (shortness of breath)    ##) Acute on chronic COPD  exacerbation: Per last PFTs patient has very severe COPD likely Gold stage IV.  He will almost certainly require chronic oxygen therapy perhaps up to 4 L. -Duo nebs every 4 hours and as needed -IV methylprednisolone 40 mg every 6, transition to p.o. - Continue home long-acting beta agonist/ICS -Consider acting long-acting muscarinic antagonist depending on what  patient can afford - Will start doxycycline though no clear evidence of pneumonia at this time however patient has elevated white count and completed steroids some days ago  ##) Metastatic non-small cell stage IV lung cancer: Currently pending chemotherapy -We will let oncology know in the morning patient is admitted -Continue morphine 15 mg every 4 hours as needed  ##) Hypertension: -Continue amlodipine 10 mg - We will hold on bisoprolol at this time due to active severe flare, other antihypertensives might be better option rather than a beta-blocker the bisoprolol is relatively selective  ##) Gastritis: Likely expiration for patient's chronic abdominal pain.  Patient has had workup as an outpatient. -Continue PPI -Can consider GI cocktail  Fluids: Tolerating p.o. Electrolytes: Monitor and supplement Nutrition: Regular diet  Prophylaxis: Enoxaparin   Disposition: Pending improvement respirator status  Full code, patient will discuss with his mother and reassess later.   Cristy Folks MD [facility]   If 7PM-7AM, please contact night-coverage [email or web address] Password [facility]  03/09/2017, 8:54 PM

## 2017-03-09 NOTE — ED Notes (Signed)
Bed: VT55 Expected date:  Expected time:  Means of arrival:  Comments: 57 yo SOB

## 2017-03-09 NOTE — ED Triage Notes (Addendum)
Patient coming from [place] care with complain of shortness of breath. Pt labored breathing. Patient hx of lung CA and is on chemo treatment currently. Patient was given albuterol 10 mg and atrovent 0.5 mg, magnesium 2 g and solumedrol 125 mg.

## 2017-03-09 NOTE — ED Provider Notes (Signed)
[facility] DEPT Provider Note   CSN: [id number] Arrival date & time: 03/09/17  1631     History   Chief Complaint No chief complaint on file.   HPI [name] is a 57 y.o. male.  HPI Patient presents with 2 3 days of worsening shortness of breath.  Cough productive yellow sputum.  Endotracheal full history due to respiratory distress.  Level 5 caveat applies.  Per EMS patient was noted to be hypoxic with saturations in the mid 80s.  He was not wearing his home oxygen.  He was given multiple nebulized treatments in route as well as magnesium and Solu-Medrol.  No some noted mild improvement of his air movement. Past Medical History:  Diagnosis Date  . Abdominal pain 06/04/2016  . Adenocarcinoma of left lung, stage 4 ([place]) 05/02/2016  . Bronchitis due to tobacco use ([place])   . Cancer ([place])    Lung  . Dehydration 06/04/2016  . Encounter for antineoplastic chemotherapy 05/02/2016  . Goals of care, counseling/discussion 05/02/2016  . HTN (hypertension) 10/30/2016  . Recurrent upper respiratory infection (URI)   . Seizures ([place]) 05/2011   new onset  . Shortness of breath     Patient Active Problem List   Diagnosis Date Noted  . COPD with acute exacerbation ([place]) 03/09/2017  . Anemia 02/07/2017  . Severe malnutrition ([place]) 01/03/2017  . DOE (dyspnea on exertion) 12/30/2016  . Nausea   . Hypervolemia   . HCAP (healthcare-associated pneumonia) 12/19/2016  . SOB (shortness of breath) 12/19/2016  . Erosive gastropathy 12/18/2016  . Gastritis 12/18/2016  . Hematemesis 12/16/2016  . Intractable nausea and vomiting 12/16/2016  . HTN (hypertension) 10/30/2016  . Chronic obstructive pulmonary disease ([place]) 09/02/2016  . COPD (chronic obstructive pulmonary disease) ([place]) 08/19/2016  . Dehydration 06/04/2016  . Abdominal pain 06/04/2016  . 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 Adenocarcinoma of left lung, stage 4 ([place]) 05/02/2016  . Goals of care,  counseling/discussion 05/02/2016  . Encounter for antineoplastic chemotherapy 05/02/2016  . Tobacco abuse 05/30/2011  . Alcohol abuse 05/30/2011  . Seizure ([place]) 05/28/2011    Past Surgical History:  Procedure Laterality Date  . ESOPHAGOGASTRODUODENOSCOPY (EGD) WITH PROPOFOL N/A 12/17/2016   Procedure: ESOPHAGOGASTRODUODENOSCOPY (EGD) WITH PROPOFOL;  Surgeon: Ladene Artist, MD;  Location: [facility] ENDOSCOPY;  Service: Endoscopy;  Laterality: N/A;  . IR FLUORO GUIDE PORT INSERTION RIGHT  05/13/2016  . IR US GUIDE VASC ACCESS RIGHT  05/13/2016  . NO PAST SURGERIES         Home Medications    Prior to Admission medications   Medication Sig Start Date End Date Taking? Authorizing Provider  acetaminophen (TYLENOL) 500 MG tablet Take 500 mg by mouth 2 (two) times daily as needed for mild pain or headache.   Yes [provider]  amLODipine (NORVASC) 10 MG tablet Take 10 mg by mouth daily.   Yes [provider]  bisoprolol (ZEBETA) 5 MG tablet Take 1 tablet (5 mg total) by mouth daily. 02/16/17  Yes Florencia Reasons, MD  Chlorphen-Phenyleph-ASA (ALKA-SELTZER PLUS COLD PO) Take 2 tablets by mouth at bedtime as needed (COUGH).   Yes [provider]  dexamethasone (DECADRON) 4 MG tablet Take 4 mg by mouth as directed. Take 1 tablet twice daily the day before, the day of and the day after chemotherapy every 3 weeks.   Yes [provider]  furosemide (LASIX) 40 MG tablet Take 1 tablet (40 mg total) by mouth every three (3) days  as needed for fluid or edema. 02/16/17 03/18/17 Yes Florencia Reasons, MD  guaiFENesin (MUCINEX) 600 MG 12 hr tablet Take 1 tablet (600 mg total) by mouth 2 (two) times daily. 02/16/17  Yes Florencia Reasons, MD  Ipratropium-Albuterol (COMBIVENT) 20-100 MCG/ACT AERS respimat Inhale 1 puff into the lungs every 6 (six) hours. 07/22/16  Yes Curt Bears, MD  lidocaine-prilocaine (EMLA) cream Apply 1 application topically as needed.   Yes [provider]  loratadine  (CLARITIN) 10 MG tablet Take 10 mg by mouth daily.   Yes [provider]  mometasone-formoterol (DULERA) 100-5 MCG/ACT AERO Inhale 2 puffs into the lungs 2 (two) times daily. 12/24/16  Yes Eugenie Filler, MD  morphine (MSIR) 15 MG tablet Take 15 mg by mouth every 4 (four) hours as needed for severe pain.   Yes [provider]  ondansetron (ZOFRAN-ODT) 4 MG disintegrating tablet Take 4 mg by mouth every 8 (eight) hours as needed for nausea or vomiting.   Yes [provider]  pantoprazole (PROTONIX) 40 MG tablet Take 1 tablet (40 mg total) by mouth 2 (two) times daily. 12/18/16  Yes Eugenie Filler, MD  senna-docusate (SENNA-PLUS) 8.6-50 MG tablet Take 1 tablet by mouth at bedtime.   Yes [provider]  predniSONE (DELTASONE) 10 MG tablet Label  & dispense according to the schedule below. 4 Pills PO on day one then, 3 Pills PO on day two, 2 Pills PO on day three, 1Pills PO on day four,  then STOP.  Total of 10 tabs Patient not taking: Reported on 03/09/2017 02/16/17   Florencia Reasons, MD    Family History Family History  Problem Relation Age of Onset  . Cancer Father   . Diabetes Mellitus II Mother     Social History Social History   Tobacco Use  . Smoking status: Current Some Day Smoker    Packs/day: 0.10    Years: 30.00    Pack years: 3.00    Types: Cigarettes  . Smokeless tobacco: Never Used  Substance Use Topics  . Alcohol use: No    Frequency: Never  . Drug use: No     Allergies   Patient has no known allergies.   Review of Systems Review of Systems  Unable to perform ROS: Acuity of condition     Physical Exam Updated Vital Signs BP 131/80 (BP Location: Left Arm)   Pulse (!) 101   Temp 98.7 F (37.1 C) (Oral)   Resp (!) 28   Ht 5\' 11"  (1.803 m)   Wt 59 kg (130 lb)   SpO2 99%   BMI 18.13 kg/m   Physical Exam  Constitutional: He is oriented to person, place, and time. He appears well-developed and well-nourished. He appears  distressed.  HENT:  Head: Normocephalic and atraumatic.  Mouth/Throat: Oropharynx is clear and moist.  Eyes: EOM are normal. Pupils are equal, round, and reactive to light.  Neck: Normal range of motion. Neck supple. JVD present.  Cardiovascular: Regular rhythm.  Tachycardia  Pulmonary/Chest:  Increased work of breathing.  Use of wrist accessory muscles.  Diminished breath sounds throughout with end expiratory wheezes especially heard on the right.  Abdominal: Soft. Bowel sounds are normal. There is no tenderness. There is no rebound and no guarding.  Musculoskeletal: Normal range of motion. He exhibits edema. He exhibits no tenderness.  1+ bilateral lower extremity pitting edema.  swelling or asymmetry.  Neurological: He is alert and oriented to person, place, and time.  Skin: Skin  is warm and dry. No rash noted. No erythema.  Psychiatric: He has a normal mood and affect. His behavior is normal.  Nursing note and vitals reviewed.    ED Treatments / Results  Labs (all labs ordered are listed, but only abnormal results are displayed) Labs Reviewed  CBC WITH DIFFERENTIAL/PLATELET - Abnormal; Notable for the following components:      Result Value   WBC 13.2 (*)    RBC 3.21 (*)    Hemoglobin 11.0 (*)    HCT 33.7 (*)    MCV 105.0 (*)    MCH 34.3 (*)    RDW 18.3 (*)    Neutro Abs 11.4 (*)    All other components within normal limits  COMPREHENSIVE METABOLIC PANEL - Abnormal; Notable for the following components:   Glucose, Bld 126 (*)    BUN 25 (*)    Creatinine, Ser 1.72 (*)    Alkaline Phosphatase 159 (*)    GFR calc non Af Amer 42 (*)    GFR calc Af Amer 49 (*)    All other components within normal limits  BRAIN NATRIURETIC PEPTIDE  TROPONIN I  BLOOD GAS, ARTERIAL    EKG  EKG Interpretation  Date/Time:  Sunday March 09 2017 16:50:17 EST Ventricular Rate:  141 PR Interval:    QRS Duration: 79 QT Interval:  290 QTC Calculation: 445 R Axis:   119 Text  Interpretation:  Sinus tachycardia Ventricular premature complex Aberrant conduction of SV complex(es) LAE, consider biatrial enlargement Anterolateral infarct, old Confirmed by Julianne Rice [phone]) on 03/09/2017 5:09:22 PM       Radiology Dg Chest Port 1 View  Result Date: 03/09/2017 CLINICAL DATA:  Severe dyspnea. EXAM: PORTABLE CHEST 1 VIEW COMPARISON:  March 01, 2017 FINDINGS: No pneumothorax. Stable right Port-A-Cath. The cardiomediastinal silhouette is unremarkable. Hyperinflation of the lungs. Scarring in the lateral left lung base. No nodules or masses. Emphysematous changes in the lungs. IMPRESSION: Emphysematous changes.  No acute abnormalities. Electronically Signed   By: Dorise Bullion III M.D   On: 03/09/2017 17:46    Procedures Procedures (including critical care time)  Medications Ordered in ED Medications  levalbuterol (XOPENEX) nebulizer solution 0.63 mg (0.63 mg Nebulization Given 03/09/17 1920)  levofloxacin (LEVAQUIN) IVPB 750 mg (750 mg Intravenous New Bag/Given 03/09/17 2111)     Initial Impression / Assessment and Plan / ED Course  I have reviewed the triage vital signs and the nursing notes.  Pertinent labs & imaging results that were available during my care of the patient were reviewed by me and considered in my medical decision making (see chart for details).    Patient appears more comfortable after breathing treatment.  X-ray without acute findings.  Patient does have mildly elevated white blood cells though this appears to be chronically elevated. Discussed with hospitalist will see patient in the emergency department Final Clinical Impressions(s) / ED Diagnoses   Final diagnoses:  COPD with acute exacerbation [facility])    ED Discharge Orders    None       Julianne Rice, MD 03/09/17 2254

## 2017-03-10 DIAGNOSIS — E440 Moderate protein-calorie malnutrition: Secondary | ICD-10-CM

## 2017-03-10 DIAGNOSIS — J449 Chronic obstructive pulmonary disease, unspecified: Secondary | ICD-10-CM

## 2017-03-10 LAB — BASIC METABOLIC PANEL
CO2: 25 mmol/L (ref 22–32)
Chloride: 102 mmol/L (ref 101–111)
Creatinine, Ser: 1.68 mg/dL — ABNORMAL HIGH (ref 0.61–1.24)
GFR calc Af Amer: 51 mL/min — ABNORMAL LOW (ref >60)
Glucose, Bld: 111 mg/dL — ABNORMAL HIGH (ref 65–99)

## 2017-03-10 LAB — CBC
HCT: 30.7 % — ABNORMAL LOW (ref 39.0–52.0)
Hemoglobin: 9.8 g/dL — ABNORMAL LOW (ref 13.0–17.0)
MCH: 33.3 pg (ref 26.0–34.0)
MCHC: 31.9 g/dL (ref 30.0–36.0)
MCV: 104.4 fL — ABNORMAL HIGH (ref 78.0–100.0)
Platelets: 275 10*3/uL (ref 150–400)
RBC: 2.94 MIL/uL — ABNORMAL LOW (ref 4.22–5.81)
RDW: 18 % — ABNORMAL HIGH (ref 11.5–15.5)
WBC: 10.6 K/uL — ABNORMAL HIGH (ref 4.0–10.5)

## 2017-03-10 LAB — BASIC METABOLIC PANEL WITH GFR
Anion gap: 11 (ref 5–15)
BUN: 32 mg/dL — ABNORMAL HIGH (ref 6–20)
Calcium: 9.5 mg/dL (ref 8.9–10.3)
GFR calc non Af Amer: 44 mL/min — ABNORMAL LOW (ref >60)
Potassium: 5 mmol/L (ref 3.5–5.1)
Sodium: 138 mmol/L (ref 135–145)

## 2017-03-10 MED ORDER — ENSURE ENLIVE PO LIQD
237.0000 mL | Freq: Two times a day (BID) | ORAL | Status: DC
  Administered 2017-03-10: 237 mL via ORAL

## 2017-03-10 MED ORDER — GUAIFENESIN-DM 100-10 MG/5ML PO SYRP
5.0000 mL | ORAL_SOLUTION | ORAL | Status: DC | PRN
  Administered 2017-03-10 – 2017-03-16 (×20): 5 mL via ORAL
  Filled 2017-03-10 (×22): qty 10

## 2017-03-10 MED ORDER — ENOXAPARIN SODIUM 40 MG/0.4ML ~~LOC~~ SOLN
40.0000 mg | Freq: Every day | SUBCUTANEOUS | Status: DC
  Filled 2017-03-10 (×2): qty 0.4

## 2017-03-10 MED ORDER — BENZONATATE 100 MG PO CAPS
200.0000 mg | ORAL_CAPSULE | Freq: Three times a day (TID) | ORAL | Status: DC | PRN
  Administered 2017-03-10 – 2017-03-11 (×5): 200 mg via ORAL
  Filled 2017-03-10 (×5): qty 2

## 2017-03-10 MED ORDER — ENSURE ENLIVE PO LIQD
237.0000 mL | Freq: Three times a day (TID) | ORAL | Status: DC
  Administered 2017-03-10 – 2017-03-19 (×14): 237 mL via ORAL

## 2017-03-10 NOTE — Progress Notes (Signed)
Initial Nutrition Assessment  DOCUMENTATION CODES:   Underweight, Non-severe (moderate) malnutrition in context of chronic illness  INTERVENTION:   Increase to Ensure Enlive po TID, each supplement provides 350 kcal and 20 grams of protein  NUTRITION DIAGNOSIS:   Moderate Malnutrition related to chronic illness, cancer and cancer related treatments as evidenced by 10.6% weight loss in three months, moderate fat depletion, moderate muscle depletion.  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 Presents this admission with worsening cough, SOB, and respiratory distress. Admitted for acute on chronic COPD exacerbation.    Spoke with pt at bedside. Reports appetite has increased over the last couple of months. Pt receives three meals per day at SNF and he typically finishes 100% if he likes the food options for that day. One day PTA had a small decrease in PO intake related to breathing issues. This has since resolved. Pt had 100% of grits and eggs this morning. Reports the [facility] provided him with two cases of Ensure. He consumes two per day. Pt denies any taste changes with chemotherapy. Discussed the importance of protein intake for preservation of lean body mass with chemotherapy going forward. Pt is to ask SNF if they can provide him with supplements. Amendable  To Ensure here.   Pt endorses a UBW of 146 lb the last time being at that weight in September. Records indicate pt weighed 141 lb 11/21 and 126 lb this stay (10.6% in 3 months, significant for time frame). Nutrition-Focused physical exam completed.   Medications  reviewed.  Labs reviewed: BUN 32 (H) Creatinine 1.68 (H)  NUTRITION - FOCUSED PHYSICAL EXAM:    Most Recent Value  Orbital Region  No depletion  Upper Arm Region  Moderate depletion  Thoracic and Lumbar Region  Moderate depletion  Buccal Region  Mild depletion  Temple Region  Moderate depletion  Clavicle Bone Region  Moderate depletion  Clavicle and Acromion Bone Region  Moderate depletion  Scapular Bone Region  Mild depletion  Dorsal Hand  Mild depletion  Patellar Region  Moderate depletion  Anterior Thigh Region  Moderate depletion  Posterior Calf Region  Moderate depletion  Edema (RD Assessment)  None  Hair  Reviewed  Eyes  Reviewed  Mouth  Reviewed  Skin  Reviewed  Nails  Reviewed     Diet Order:  Diet regular Room service appropriate? Yes; Fluid consistency: Thin  EDUCATION NEEDS:   Education needs have been addressed  Skin:  Skin Assessment: Reviewed RN Assessment  Last BM:  03/10/17  Height:   Ht Readings from Last 1 Encounters:  03/09/17 5\' 11"  (1.803 m)    Weight:   Wt Readings from Last 1 Encounters:  03/09/17 126 lb 4.8 oz (57.3 kg)    Ideal Body Weight:  78.2 kg  BMI:  Body mass index is 17.62 kg/m.  Estimated Nutritional Needs:   Kcal:  1750-1950 kcal/day  Protein:  85-95 g/day  Fluid:  >1.7 L/day    Mariana Single RD, LDN Clinical Nutrition Pager # - [phone]

## 2017-03-10 NOTE — ED Notes (Signed)
Patient is from [place]. Previous notes provided different information.

## 2017-03-10 NOTE — Progress Notes (Signed)
PROGRESS NOTE    [name]  DGU:[id number] DOB: [birth year] DOA: 03/09/2017 PCP: Patient, No Pcp Per    Brief Narrative:  57 y.o. 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  Assessment & Plan:    Acute on chronic COPD exacerbation: Per last PFTs patient has very severe COPD likely Gold stage IV.  Suspect patient will require chronic oxygen use - continue duonebs - Continue current solumedrol dosing. Decrease with improvement in condition. - Continue home long-acting beta agonist/ICS - Will continue doxycycline for copd e   Metastatic non-small cell stage IV lung cancer: Currently pending chemotherapy -Continue morphine 15 mg every 4 hours as needed - will relay to his oncologist patient is in house.   Hypertension: - Pt on amlodipine - We will hold on bisoprolol at this time due to active severe flare   Gastritis: Likely expiration for patient's chronic abdominal pain.  Patient has had workup as an outpatient. - Stable on PPI   DVT prophylaxis: Lovenox Code Status: Full Family Communication: none at bedside. Disposition Plan: pending improvement in respiratory condition   Consultants:   None   Procedures: None   Antimicrobials: doxycycline  Subjective: Pt has no new complaints.   Objective: Vitals:   03/10/17 0624 03/10/17 0843 03/10/17 1118 03/10/17 1241  BP:    (!) 148/69  Pulse:    (!) 104  Resp:      Temp:    98 F (36.7 C)  TempSrc:    Oral  SpO2: 96% 99% 98% 97%  Weight:      Height:        Intake/Output Summary (Last 24 hours) at 03/10/2017 1703 Last data filed at  03/10/2017 1244 Gross per 24 hour  Intake 610 ml  Output 900 ml  Net -290 ml   Filed Weights   03/09/17 1651 03/09/17 2356  Weight: 59 kg (130 lb) 57.3 kg (126 lb 4.8 oz)    Examination:  General exam: Appears calm and comfortable, in nad.  Respiratory system: chest sounds tight, exp wheezes, no rhales, equal chest rise.  Cardiovascular system: S1 & S2 heard, RRR. No JVD, murmurs, rubs Gastrointestinal system: Abdomen is nondistended, soft and nontender. No organomegaly or masses felt. Normal bowel sounds heard. Central nervous system: Alert and oriented. No focal neurological deficits. Extremities: Symmetric 5 x 5 power Skin: No rashes, lesions or ulcers, on limited exam. Psychiatry: Mood & affect appropriate.     Data Reviewed: I have personally reviewed following labs and imaging studies  CBC: Recent Labs  Lab 03/09/17 1719 03/10/17 0458  WBC 13.2* 10.6*  NEUTROABS 11.4*  --   HGB 11.0* 9.8*  HCT 33.7* 30.7*  MCV 105.0* 104.4*  PLT 289 956   Basic Metabolic Panel: Recent Labs  Lab 03/09/17 1719 03/10/17 0458  NA 141 138  K 4.1 5.0  CL 104 102  CO2 25 25  GLUCOSE 126* 111*  BUN 25* 32*  CREATININE 1.72* 1.68*  CALCIUM 9.7 9.5   GFR: Estimated Creatinine Clearance: 39.3 mL/min (A) (by C-G formula based on SCr of 1.68 mg/dL (H)). Liver Function Tests: Recent Labs  Lab 03/09/17 1719  AST 27  ALT 23  ALKPHOS  159*  BILITOT 0.4  PROT 7.6  ALBUMIN 3.6   No results for input(s): LIPASE, AMYLASE in the last 168 hours. No results for input(s): AMMONIA in the last 168 hours. Coagulation Profile: No results for input(s): INR, PROTIME in the last 168 hours. Cardiac Enzymes: Recent Labs  Lab 03/09/17 1719  TROPONINI <0.03   BNP (last 3 results) No results for input(s): PROBNP in the last 8760 hours. HbA1C: No results for input(s): HGBA1C in the last 72 hours. CBG: No results for input(s): GLUCAP in the last 168 hours. Lipid Profile: No results for  input(s): CHOL, HDL, LDLCALC, TRIG, CHOLHDL, LDLDIRECT in the last 72 hours. Thyroid Function Tests: No results for input(s): TSH, T4TOTAL, FREET4, T3FREE, THYROIDAB in the last 72 hours. Anemia Panel: No results for input(s): VITAMINB12, FOLATE, FERRITIN, TIBC, IRON, RETICCTPCT in the last 72 hours. Sepsis Labs: No results for input(s): PROCALCITON, LATICACIDVEN in the last 168 hours.  No results found for this or any previous visit (from the past 240 hour(s)).       Radiology Studies: Dg Chest Port 1 View  Result Date: 03/09/2017 CLINICAL DATA:  Severe dyspnea. EXAM: PORTABLE CHEST 1 VIEW COMPARISON:  March 01, 2017 FINDINGS: No pneumothorax. Stable right Port-A-Cath. The cardiomediastinal silhouette is unremarkable. Hyperinflation of the lungs. Scarring in the lateral left lung base. No nodules or masses. Emphysematous changes in the lungs. IMPRESSION: Emphysematous changes.  No acute abnormalities. Electronically Signed   By: Dorise Bullion III M.D   On: 03/09/2017 17:46        Scheduled Meds: . amLODipine  10 mg Oral Daily  . doxycycline  100 mg Oral Q12H  . enoxaparin (LOVENOX) injection  40 mg Subcutaneous QHS  . feeding supplement (ENSURE ENLIVE)  237 mL Oral TID BM  . guaiFENesin  600 mg Oral BID  . ipratropium-albuterol  3 mL Nebulization Q4H  . loratadine  10 mg Oral Daily  . methylPREDNISolone (SOLU-MEDROL) injection  40 mg Intravenous Q6H   Followed by  . [START ON 03/11/2017] predniSONE  40 mg Oral Q supper  . mometasone-formoterol  2 puff Inhalation BID  . pantoprazole  40 mg Oral BID  . senna-docusate  1 tablet Oral QHS   Continuous Infusions:   LOS: 1 day    Time spent: > 35 minutes  Velvet Bathe, MD [facility] Pager [phone]  If 7PM-7AM, please contact night-coverage [email or web address] Password [facility] 03/10/2017, 5:03 PM

## 2017-03-11 MED ORDER — LORAZEPAM 0.5 MG PO TABS
0.5000 mg | ORAL_TABLET | Freq: Four times a day (QID) | ORAL | Status: DC | PRN
  Administered 2017-03-11 – 2017-03-14 (×4): 0.5 mg via ORAL
  Filled 2017-03-11 (×5): qty 1

## 2017-03-11 MED ORDER — BENZONATATE 100 MG PO CAPS
200.0000 mg | ORAL_CAPSULE | Freq: Three times a day (TID) | ORAL | Status: DC | PRN
  Administered 2017-03-12 – 2017-03-19 (×9): 200 mg via ORAL
  Filled 2017-03-11 (×9): qty 2

## 2017-03-11 MED ORDER — METHYLPREDNISOLONE SODIUM SUCC 125 MG IJ SOLR
60.0000 mg | Freq: Two times a day (BID) | INTRAMUSCULAR | Status: DC
  Administered 2017-03-11 – 2017-03-13 (×5): 60 mg via INTRAVENOUS
  Filled 2017-03-11 (×5): qty 2

## 2017-03-11 MED ORDER — TIOTROPIUM BROMIDE MONOHYDRATE 18 MCG IN CAPS
18.0000 ug | ORAL_CAPSULE | Freq: Every day | RESPIRATORY_TRACT | Status: DC
  Administered 2017-03-11 – 2017-03-12 (×2): 18 ug via RESPIRATORY_TRACT
  Filled 2017-03-11: qty 5

## 2017-03-11 MED ORDER — ALBUTEROL SULFATE (2.5 MG/3ML) 0.083% IN NEBU
2.5000 mg | INHALATION_SOLUTION | RESPIRATORY_TRACT | Status: DC
  Administered 2017-03-11 – 2017-03-12 (×6): 2.5 mg via RESPIRATORY_TRACT
  Filled 2017-03-11 (×6): qty 3

## 2017-03-11 NOTE — Progress Notes (Signed)
PROGRESS NOTE    [name]  [id number]:[id number] DOB: 22-Feb-1960 DOA: 03/09/2017 PCP: Patient, No Pcp Per    Brief Narrative:  57 y.o. 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  Assessment & Plan:    Acute on chronic COPD exacerbation: Per last PFTs patient has very severe COPD likely Gold stage IV.  Suspect patient will require chronic oxygen use - will add spiriva and d/c duonebs - Pt was transitioned to oral prednisone but on exam was worse with wheezing. As such placed back on IV steroids. - Continue home long-acting beta agonist/ICS - doxycycline.   Metastatic non-small cell stage IV lung cancer: Currently pending chemotherapy -Continue morphine 15 mg every 4 hours as needed - Pt to f/u with oncologist after hospital discharge.    Hypertension: - Pt on amlodipine - We will hold on bisoprolol at this time 2ary to copde   Gastritis: Likely expiration for patient's chronic abdominal pain.  Patient has had workup as an outpatient. - Stable on PPI   DVT prophylaxis: Lovenox Code Status: Full Family Communication: none at bedside. Disposition Plan: pending improvement in respiratory condition   Consultants:   None   Procedures: None   Antimicrobials: doxycycline  Subjective: Pt states his breathing condition is worse.   Objective: Vitals:   03/11/17 0831 03/11/17 1201 03/11/17 1347 03/11/17 1516  BP:   (!) 165/86   Pulse:   74   Resp:   (!) 24   Temp:   97.6 F (36.4 C)   TempSrc:   Oral   SpO2: 98% 96% 96% 97%  Weight:      Height:        Intake/Output Summary (Last 24 hours) at  03/11/2017 1800 Last data filed at 03/11/2017 1732 Gross per 24 hour  Intake 460 ml  Output 1800 ml  Net -1340 ml   Filed Weights   03/09/17 1651 03/09/17 2356  Weight: 59 kg (130 lb) 57.3 kg (126 lb 4.8 oz)    Examination:  General exam: Appears calm and comfortable, in nad.  Respiratory system: chest sounds tight, + exp wheezes diffusely, no rhales, equal chest rise.  Cardiovascular system: S1 & S2 heard, RRR. No JVD, murmurs, rubs Gastrointestinal system: Abdomen is nondistended, soft and nontender. No organomegaly or masses felt. Normal bowel sounds heard. Central nervous system: Alert and oriented. No focal neurological deficits. Extremities: Symmetric 5 x 5 power Skin: No rashes, lesions or ulcers, on limited exam. Psychiatry: Mood & affect appropriate.   Data Reviewed: I have personally reviewed following labs and imaging studies  CBC: Recent Labs  Lab 03/09/17 1719 03/10/17 0458  WBC 13.2* 10.6*  NEUTROABS 11.4*  --   HGB 11.0* 9.8*  HCT 33.7* 30.7*  MCV 105.0* 104.4*  PLT 289 270   Basic Metabolic Panel: Recent Labs  Lab 03/09/17 1719 03/10/17 0458  NA 141 138  K 4.1 5.0  CL 104 102  CO2 25 25  GLUCOSE 126* 111*  BUN 25* 32*  CREATININE 1.72* 1.68*  CALCIUM 9.7 9.5   GFR: Estimated Creatinine Clearance: 39.3 mL/min (A) (by C-G formula based on SCr of 1.68 mg/dL (H)). Liver Function Tests: Recent Labs  Lab  03/09/17 1719  AST 27  ALT 23  ALKPHOS 159*  BILITOT 0.4  PROT 7.6  ALBUMIN 3.6   No results for input(s): LIPASE, AMYLASE in the last 168 hours. No results for input(s): AMMONIA in the last 168 hours. Coagulation Profile: No results for input(s): INR, PROTIME in the last 168 hours. Cardiac Enzymes: Recent Labs  Lab 03/09/17 1719  TROPONINI <0.03   BNP (last 3 results) No results for input(s): PROBNP in the last 8760 hours. HbA1C: No results for input(s): HGBA1C in the last 72 hours. CBG: No results for input(s): GLUCAP in the  last 168 hours. Lipid Profile: No results for input(s): CHOL, HDL, LDLCALC, TRIG, CHOLHDL, LDLDIRECT in the last 72 hours. Thyroid Function Tests: No results for input(s): TSH, T4TOTAL, FREET4, T3FREE, THYROIDAB in the last 72 hours. Anemia Panel: No results for input(s): VITAMINB12, FOLATE, FERRITIN, TIBC, IRON, RETICCTPCT in the last 72 hours. Sepsis Labs: No results for input(s): PROCALCITON, LATICACIDVEN in the last 168 hours.  No results found for this or any previous visit (from the past 240 hour(s)).       Radiology Studies: No results found.      Scheduled Meds: . albuterol  2.5 mg Nebulization Q4H  . amLODipine  10 mg Oral Daily  . doxycycline  100 mg Oral Q12H  . enoxaparin (LOVENOX) injection  40 mg Subcutaneous QHS  . feeding supplement (ENSURE ENLIVE)  237 mL Oral TID BM  . guaiFENesin  600 mg Oral BID  . loratadine  10 mg Oral Daily  . methylPREDNISolone (SOLU-MEDROL) injection  60 mg Intravenous Q12H  . mometasone-formoterol  2 puff Inhalation BID  . pantoprazole  40 mg Oral BID  . senna-docusate  1 tablet Oral QHS  . tiotropium  18 mcg Inhalation Daily   Continuous Infusions:   LOS: 2 days    Time spent: > 35 minutes  Velvet Bathe, MD [facility] Pager [phone]  If 7PM-7AM, please contact night-coverage [email or web address] Password [email or web address] 03/11/2017, 6:00 PM

## 2017-03-11 NOTE — Progress Notes (Signed)
Medications administered by student RN [phone] with supervision of Clinical Instructor Lynann Bologna MSN, RN-BC or patient's assigned RN.

## 2017-03-12 DIAGNOSIS — N183 Chronic kidney disease, stage 3 unspecified: Secondary | ICD-10-CM

## 2017-03-12 DIAGNOSIS — J441 Chronic obstructive pulmonary disease with (acute) exacerbation: Principal | ICD-10-CM

## 2017-03-12 MED ORDER — IPRATROPIUM-ALBUTEROL 0.5-2.5 (3) MG/3ML IN SOLN
3.0000 mL | Freq: Four times a day (QID) | RESPIRATORY_TRACT | Status: DC
  Administered 2017-03-12 – 2017-03-18 (×24): 3 mL via RESPIRATORY_TRACT
  Filled 2017-03-12 (×24): qty 3

## 2017-03-12 MED ORDER — BISOPROLOL FUMARATE 5 MG PO TABS
5.0000 mg | ORAL_TABLET | Freq: Every day | ORAL | Status: DC
  Administered 2017-03-12 – 2017-03-19 (×8): 5 mg via ORAL
  Filled 2017-03-12 (×8): qty 1

## 2017-03-12 MED ORDER — IPRATROPIUM-ALBUTEROL 0.5-2.5 (3) MG/3ML IN SOLN
3.0000 mL | RESPIRATORY_TRACT | Status: DC | PRN
  Administered 2017-03-18 – 2017-03-19 (×2): 3 mL via RESPIRATORY_TRACT
  Filled 2017-03-12 (×2): qty 3

## 2017-03-12 NOTE — Clinical Social Work Note (Signed)
Patient verbalized plan to discharge back to [facility] when medically stable. Patient recently assessed on 02/10/17 no psychosocial changes, please see psychosocial assessment below.  Clinical Social Work Assessment  Patient Details  Name: [name] MRN: [id number] Date of Birth: May 20, 1960  Date of referral:                  Reason for consult:   Discharge Planning                Permission sought to share information with:    Permission granted to share information::     Name::        Agency::     Relationship::     Contact Information:     Housing/Transportation Living arrangements for the past 2 months:   [facility] Source of Information:   Patient Patient Interpreter Needed:   None Criminal Activity/Legal Involvement Pertinent to Current Situation/Hospitalization:   No Significant Relationships:   Other Family members Lives with:   Facility resident Do you feel safe going back to the place where you live?   Yes Need for family participation in patient care:   No  Care giving concerns:  Patient from [facility] ([name] term care resident). Patient reported that he called EMS after having difficulty with his traveling oxygen tank. CSW inquired if patient needed some education on working tank. Patient reported that he was aware of how to work the tank and that he didn't need education.    Social Worker assessment / plan:  CSW spoke with patient at bedside regarding discharge planning. Patient verbalized plan to dc back to [name]. Patient reported that his [name] term goal is to move out and get an apartment, so he can have a portion of his check left over. Patient reported that he will return to [name] and work on getting his stuff together to move out eventually. CSW agreed to contact [name] and provide update.   CSW contacted [name] to confirm patient's ability to return, no answer. CSW left voicemail requesting  return phone call.   CSW will continue to follow and assist with discharge planning.   Employment status:   Disabled Insurance information:   Medicaid PT Recommendations:   Not assessed at this time Information / Referral to community resources:   Patient from ALF  Patient/Family's Response to care:  Patient appreciative of CSW assistance with discharge planning.  Patient/Family's Understanding of and Emotional Response to Diagnosis, Current Treatment, and Prognosis:  Patient initially presented guarded and blunt. Patient verbalized understanding of diagnosis and current treatment. After speaking with patient for awhile patient become more open, noting that he is always sick. CSW acknowledged patient's situation and feelings surrounding it. Patient reported that his [name] term goal is to get an apartment and live independently. CSW positively affirmed patient's goal. Patient verbalized plan to dc back to ALF and work on his goal from there.   Emotional Assessment Appearance:   Appears stated age Attitude/Demeanor/Rapport:    Affect (typically observed):   Guarded, Quiet Orientation:   Oriented to self, Oriented to time, Oriented to place, Oriented to situation Alcohol / Substance use:   N/A Psych involvement (Current and /or in the community):   No  Discharge Needs  Concerns to be addressed:   Care Coordination Readmission within the last 30 days:   Yes Current discharge risk:   None Barriers to Discharge:   Continued medical  workup   Johnathan Medin, LCSW 03/12/2017, 3:39 PM

## 2017-03-12 NOTE — Progress Notes (Addendum)
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 Patient was admitted and is currently being managed for COPD exacerbation:   Assessment & Plan:   Principal Problem:   COPD with acute exacerbation ([name]) Active Problems:   Seizure ([name])   Tobacco abuse   Adenocarcinoma of left lung, stage 4 (HCC)   Chronic obstructive pulmonary disease (HCC)   HTN (hypertension)   Gastritis   SOB (shortness of breath)   Malnutrition of moderate degree   CKD (chronic kidney disease), stage III (HCC)  Acute  COPD exacerbation: Has history of severe COPD.  Gold stage IV.  Patient is on as needed oxygen at home.  Most likely will qualify for continuous home oxygen. Continue scheduled bronchodilators.  Continue steroid. Patient was also started on doxycycline for COPD exacerbation Continues to be dyspneic.  But saturating fine on supplemental oxygen. Patient smokes occasionally and was smoking before he presented to the emergency department for dyspnea  Metastatic non-small cell stage IV lung cancer: Follows with Dr. Julien [name].  Currently on chemotherapy.  Next chemo on 03/14/17. Continue pain medications.  Dr. Julien [name] notified about his current situation.  Hypertension: Currently blood pressure stable.  Continue Norvasc, beta-blockers.  History of gastritis: Patient has history of chronic abdominal pain secondary to gastritis.  Continue PPI  CKD: His baseline creatinine ranges from 1.3-1.6.  Currently  kidney function on baseline.    DVT prophylaxis: Lovenox Code Status: Full Family Communication: None present at the bedside Disposition Plan: Unknown at this time  Consultants: None  Procedures:None  Antimicrobials:Doxycycline  Subjective:  .Patient seen and examined the bedside this morning.  Still short of breath.  Complains of severe shortness of breath on minimal ambulation. Objective: Vitals:   03/12/17 0550 03/12/17 0753 03/12/17 1349 03/12/17 1402  BP: (!) 146/91  (!) 147/80   Pulse: (!) 109  75   Resp: (!) 22  18   Temp: 97.9 F (36.6 C)  (!) 97.4 F (36.3 C)   TempSrc: Oral  Oral   SpO2: 97% 100% 100% 100%  Weight:      Height:        Intake/Output Summary (Last 24 hours) at 03/12/2017 1451 Last data filed at 03/12/2017 1000 Gross per 24 hour  Intake 600 ml  Output 2125 ml  Net -1525 ml   Filed Weights   03/09/17 1651 03/09/17 2356  Weight: 59 kg (130 lb) 57.3 kg (126 lb 4.8 oz)    Examination:  General exam: Appears calm and comfortable ,In mild-moderate respiratory distress,thin HEENT:PERRL,Oral mucosa moist, Ear/Nose normal on gross exam Respiratory system: Bilateral  Decreased air entry,Occasional expiratory wheezes Cardiovascular system: S1 & S2 heard, RRR. No JVD, murmurs, rubs, gallops or clicks. No pedal edema. Gastrointestinal system: Abdomen is nondistended, soft and nontender. No organomegaly or masses felt. Normal bowel sounds heard. Central nervous system: Alert and oriented. No focal neurological deficits. Extremities: No edema, no clubbing ,no cyanosis, distal peripheral pulses palpable. Skin: No rashes, lesions or ulcers,no icterus ,no pallor MSK: Normal muscle bulk,tone ,power Psychiatry: Judgement and insight appear normal. Mood &  affect appropriate.     Data Reviewed: I have personally reviewed following labs and imaging studies  CBC: Recent Labs  Lab 03/09/17 1719 03/10/17 0458  WBC 13.2* 10.6*  NEUTROABS 11.4*  --   HGB  11.0* 9.8*  HCT 33.7* 30.7*  MCV 105.0* 104.4*  PLT 289 465   Basic Metabolic Panel: Recent Labs  Lab 03/09/17 1719 03/10/17 0458  NA 141 138  K 4.1 5.0  CL 104 102  CO2 25 25  GLUCOSE 126* 111*  BUN 25* 32*  CREATININE 1.72* 1.68*  CALCIUM 9.7 9.5   GFR: Estimated Creatinine Clearance: 39.3 mL/min (A) (by C-G formula based on SCr of 1.68 mg/dL (H)). Liver Function Tests: Recent Labs  Lab 03/09/17 1719  AST 27  ALT 23  ALKPHOS 159*  BILITOT 0.4  PROT 7.6  ALBUMIN 3.6   No results for input(s): LIPASE, AMYLASE in the last 168 hours. No results for input(s): AMMONIA in the last 168 hours. Coagulation Profile: No results for input(s): INR, PROTIME in the last 168 hours. Cardiac Enzymes: Recent Labs  Lab 03/09/17 1719  TROPONINI <0.03   BNP (last 3 results) No results for input(s): PROBNP in the last 8760 hours. HbA1C: No results for input(s): HGBA1C in the last 72 hours. CBG: No results for input(s): GLUCAP in the last 168 hours. Lipid Profile: No results for input(s): CHOL, HDL, LDLCALC, TRIG, CHOLHDL, LDLDIRECT in the last 72 hours. Thyroid Function Tests: No results for input(s): TSH, T4TOTAL, FREET4, T3FREE, THYROIDAB in the last 72 hours. Anemia Panel: No results for input(s): VITAMINB12, FOLATE, FERRITIN, TIBC, IRON, RETICCTPCT in the last 72 hours. Sepsis Labs: No results for input(s): PROCALCITON, LATICACIDVEN in the last 168 hours.  No results found for this or any previous visit (from the past 240 hour(s)).       Radiology Studies: No results found.      Scheduled Meds: . amLODipine  10 mg Oral Daily  . bisoprolol  5 mg Oral Daily  . doxycycline  100 mg Oral Q12H  . enoxaparin (LOVENOX) injection  40 mg Subcutaneous QHS  . feeding supplement (ENSURE ENLIVE)  237 mL Oral TID BM  . guaiFENesin  600 mg Oral BID  . ipratropium-albuterol  3 mL Nebulization Q6H  . loratadine  10 mg Oral Daily  . methylPREDNISolone (SOLU-MEDROL) injection   60 mg Intravenous Q12H  . pantoprazole  40 mg Oral BID  . senna-docusate  1 tablet Oral QHS   Continuous Infusions:   LOS: 3 days    Time spent: More than 50% of that time was spent in counseling and/or coordination of care.      Johnathan Lenz, MD [facility] Pager [phone]  If 7PM-7AM, please contact night-coverage [email or web address] Password [facility] 03/12/2017, 2:51 PM

## 2017-03-13 ENCOUNTER — Telehealth: Payer: Self-pay | Admitting: Internal Medicine

## 2017-03-13 ENCOUNTER — Other Ambulatory Visit: Payer: Medicaid Other

## 2017-03-13 ENCOUNTER — Ambulatory Visit: Payer: Medicaid Other

## 2017-03-13 ENCOUNTER — Inpatient Hospital Stay (HOSPITAL_COMMUNITY): Payer: Medicaid Other

## 2017-03-13 ENCOUNTER — Ambulatory Visit: Payer: Medicaid Other | Admitting: Oncology

## 2017-03-13 LAB — BASIC METABOLIC PANEL
ANION GAP: 13 (ref 5–15)
BUN: 39 mg/dL — ABNORMAL HIGH (ref 6–20)
CHLORIDE: 98 mmol/L — AB (ref 101–111)
CO2: 28 mmol/L (ref 22–32)
Calcium: 10 mg/dL (ref 8.9–10.3)
Creatinine, Ser: 1.5 mg/dL — ABNORMAL HIGH (ref 0.61–1.24)
GFR calc non Af Amer: 50 mL/min — ABNORMAL LOW (ref >60)
GFR, EST AFRICAN AMERICAN: 58 mL/min — AB (ref >60)
Glucose, Bld: 168 mg/dL — ABNORMAL HIGH (ref 65–99)
Potassium: 4.7 mmol/L (ref 3.5–5.1)
Sodium: 139 mmol/L (ref 135–145)

## 2017-03-13 LAB — CBC WITH DIFFERENTIAL/PLATELET
BAND NEUTROPHILS: 3 %
BASOS ABS: 0 10*3/uL (ref 0.0–0.1)
BASOS PCT: 0 %
EOS ABS: 0 10*3/uL (ref 0.0–0.7)
EOS PCT: 0 %
HEMATOCRIT: 38.2 % — AB (ref 39.0–52.0)
HEMOGLOBIN: 12.2 g/dL — AB (ref 13.0–17.0)
Lymphocytes Relative: 5 %
Lymphs Abs: 1.2 10*3/uL (ref 0.7–4.0)
MCH: 34.5 pg — ABNORMAL HIGH (ref 26.0–34.0)
MCHC: 31.9 g/dL (ref 30.0–36.0)
MCV: 107.9 fL — AB (ref 78.0–100.0)
Monocytes Absolute: 1 10*3/uL (ref 0.1–1.0)
Monocytes Relative: 4 %
Myelocytes: 3 %
NEUTROS ABS: 22.7 10*3/uL — AB (ref 1.7–7.7)
Neutrophils Relative %: 85 %
Platelets: 254 10*3/uL (ref 150–400)
RBC: 3.54 MIL/uL — ABNORMAL LOW (ref 4.22–5.81)
RDW: 17.9 % — ABNORMAL HIGH (ref 11.5–15.5)
WBC: 24.9 10*3/uL — ABNORMAL HIGH (ref 4.0–10.5)

## 2017-03-13 LAB — D-DIMER, QUANTITATIVE: D-Dimer, Quant: 0.92 ug/mL-FEU — ABNORMAL HIGH (ref 0.00–0.50)

## 2017-03-13 MED ORDER — SODIUM CHLORIDE 0.9 % IV SOLN
INTRAVENOUS | Status: DC
  Administered 2017-03-13 – 2017-03-14 (×3): via INTRAVENOUS

## 2017-03-13 MED ORDER — IOPAMIDOL (ISOVUE-370) INJECTION 76%
100.0000 mL | Freq: Once | INTRAVENOUS | Status: AC | PRN
  Administered 2017-03-13: 100 mL via INTRAVENOUS

## 2017-03-13 MED ORDER — METHYLPREDNISOLONE SODIUM SUCC 40 MG IJ SOLR
40.0000 mg | Freq: Two times a day (BID) | INTRAMUSCULAR | Status: DC
  Administered 2017-03-13 – 2017-03-18 (×10): 40 mg via INTRAVENOUS
  Filled 2017-03-13 (×10): qty 1

## 2017-03-13 MED ORDER — IOPAMIDOL (ISOVUE-370) INJECTION 76%
INTRAVENOUS | Status: AC
  Filled 2017-03-13: qty 100

## 2017-03-13 MED ORDER — HEPARIN SODIUM (PORCINE) 5000 UNIT/ML IJ SOLN
5000.0000 [IU] | Freq: Three times a day (TID) | INTRAMUSCULAR | Status: DC
  Filled 2017-03-13: qty 1

## 2017-03-13 MED ORDER — SODIUM CHLORIDE 0.9 % IV BOLUS (SEPSIS)
500.0000 mL | Freq: Once | INTRAVENOUS | Status: AC
  Administered 2017-03-13: 500 mL via INTRAVENOUS

## 2017-03-13 NOTE — Progress Notes (Addendum)
PROGRESS NOTE    [name]  [id number] DOB: [birth year] DOA: 03/09/2017 PCP: Patient, No Pcp Per   Brief Narrative: 57 y.o.malewith medical history significant ofstage IV non-small cell lung cancer,  spinal metastases status post radiation therapy, undergoing current chemotherapy (next treatment on 03/14/2017), Gold stage IV COPD ,on oxygen intermittently on 2-3 L nasal cannula, remote history of seizure disorder, diastolic dysfunction by echo on 01/2017, hypertension, gastritis who comes in with worsening cough, shortness of breath, respiratory distress. Patient was admitted and is currently being managed for COPD exacerbation:   Assessment & Plan:   Principal Problem:   COPD with acute exacerbation ([place]) Active Problems:   Seizure ([place])   Tobacco abuse   Adenocarcinoma of left lung, stage 4 (HCC)   Chronic obstructive pulmonary disease (HCC)   HTN (hypertension)   Gastritis   SOB (shortness of breath)   Malnutrition of moderate degree   CKD (chronic kidney disease), stage III (HCC)  Acute  COPD exacerbation: Has history of severe COPD.  Gold stage IV.  Patient is on as needed oxygen at home.  Most likely will qualify for continuous home oxygen. Continue scheduled bronchodilators.  Continue steroid. Patient was also started on doxycycline for COPD exacerbation Continues to be dyspneic.  But saturating fine on supplemental oxygen. Patient smokes occasionally and was smoking before he presented to the emergency department for dyspnea  Persistent dyspnea: Complains of persistent dyspnea.  Becomes short of breath on minimal exertion.  Chest x-ray did not show any pneumonia on presentation.  D-dimer checked and  found to be elevated this morning.  Will rule out pulmonary emboli on this cancer patient.  Ordered CT scan with contrast to rule out PE.  Started on IV fluids to prevent contrast nephropathy.  Leukocytosis: Most likely secondary to steroids.  We will continue to  monitor the trend.  Solu-Medrol tapered to 40 mg IV twice daily.  Metastatic non-small cell stage IV lung cancer: Follows with [name].  Currently on chemotherapy.  Next chemo on 03/14/17. Continue pain medications.  [name] notified about his current situation and he is aware about him.  Hypertension: Currently blood pressure stable.  Continue Norvasc, beta-blockers.  History of gastritis: Patient has history of chronic abdominal pain secondary to gastritis.  Continue PPI  CKD: His baseline creatinine ranges from 1.3-1.6.  Currently kidney function on baseline.    DVT prophylaxis: Heparin Code Status: Full Family Communication: None present at the bedside Disposition Plan: Home likely in 2-3 days  Consultants: None  Procedures:None  Antimicrobials:Doxycycline since 03/10/17  Subjective: Patient seen and examined the bedside this morning.  Still remains short of breath.  Objective: Vitals:   03/13/17 0827 03/13/17 0853 03/13/17 1300 03/13/17 1401  BP:  (!) 170/81 132/76   Pulse:  60 60   Resp:  (!) 28    Temp:  98 F (36.7 C) 98.9 F (37.2 C)   TempSrc:  Oral Oral   SpO2: 100% 94% 98% 92%  Weight:      Height:        Intake/Output Summary (Last 24 hours) at 03/13/2017 1602 Last data filed at 03/13/2017 1228 Gross per 24 hour  Intake 600 ml  Output 2150 ml  Net -1550 ml   Filed Weights   03/09/17 1651 03/09/17 2356  Weight: 59 kg (130 lb) 57.3 kg (126 lb 4.8 oz)    Examination:  General exam:In mild-moderate respiratory distress,thin/cachetic HEENT:PERRL,Oral mucosa moist, Ear/Nose normal on gross exam Respiratory system: Bilateral  Decreased air entry,Occasional expiratory wheezes Cardiovascular system: S1 & S2 heard, RRR. No JVD, murmurs, rubs, gallops or clicks. No pedal edema. Gastrointestinal system: Abdomen is nondistended, soft and nontender. No organomegaly or masses felt. Normal bowel sounds heard. Central nervous system: Alert and oriented. No  focal neurological deficits. Extremities: No edema, no clubbing ,no cyanosis, distal peripheral pulses palpable. Skin: No rashes, lesions or ulcers,no icterus ,no pallor MSK: Normal muscle bulk,tone ,power Psychiatry: Judgement and insight appear normal. Mood & affect appropriate.     Data Reviewed: I have personally reviewed following labs and imaging studies  CBC: Recent Labs  Lab 03/09/17 1719 03/10/17 0458 03/13/17 0857  WBC 13.2* 10.6* 24.9*  NEUTROABS 11.4*  --  22.7*  HGB 11.0* 9.8* 12.2*  HCT 33.7* 30.7* 38.2*  MCV 105.0* 104.4* 107.9*  PLT 289 275 431   Basic Metabolic Panel: Recent Labs  Lab 03/09/17 1719 03/10/17 0458 03/13/17 0857  NA 141 138 139  K 4.1 5.0 4.7  CL 104 102 98*  CO2 25 25 28   GLUCOSE 126* 111* 168*  BUN 25* 32* 39*  CREATININE 1.72* 1.68* 1.50*  CALCIUM 9.7 9.5 10.0   GFR: Estimated Creatinine Clearance: 44 mL/min (A) (by C-G formula based on SCr of 1.5 mg/dL (H)). Liver Function Tests: Recent Labs  Lab 03/09/17 1719  AST 27  ALT 23  ALKPHOS 159*  BILITOT 0.4  PROT 7.6  ALBUMIN 3.6   No results for input(s): LIPASE, AMYLASE in the last 168 hours. No results for input(s): AMMONIA in the last 168 hours. Coagulation Profile: No results for input(s): INR, PROTIME in the last 168 hours. Cardiac Enzymes: Recent Labs  Lab 03/09/17 1719  TROPONINI <0.03   BNP (last 3 results) No results for input(s): PROBNP in the last 8760 hours. HbA1C: No results for input(s): HGBA1C in the last 72 hours. CBG: No results for input(s): GLUCAP in the last 168 hours. Lipid Profile: No results for input(s): CHOL, HDL, LDLCALC, TRIG, CHOLHDL, LDLDIRECT in the last 72 hours. Thyroid Function Tests: No results for input(s): TSH, T4TOTAL, FREET4, T3FREE, THYROIDAB in the last 72 hours. Anemia Panel: No results for input(s): VITAMINB12, FOLATE, FERRITIN, TIBC, IRON, RETICCTPCT in the last 72 hours. Sepsis Labs: No results for input(s):  PROCALCITON, LATICACIDVEN in the last 168 hours.  No results found for this or any previous visit (from the past 240 hour(s)).       Radiology Studies: No results found.      Scheduled Meds: . amLODipine  10 mg Oral Daily  . bisoprolol  5 mg Oral Daily  . doxycycline  100 mg Oral Q12H  . enoxaparin (LOVENOX) injection  40 mg Subcutaneous QHS  . feeding supplement (ENSURE ENLIVE)  237 mL Oral TID BM  . guaiFENesin  600 mg Oral BID  . iopamidol      . ipratropium-albuterol  3 mL Nebulization Q6H  . loratadine  10 mg Oral Daily  . methylPREDNISolone (SOLU-MEDROL) injection  40 mg Intravenous Q12H  . pantoprazole  40 mg Oral BID  . senna-docusate  1 tablet Oral QHS   Continuous Infusions: . sodium chloride 100 mL/hr at 03/13/17 1211     LOS: 4 days    Time spent:25 minutes. More than 50% of that time was spent in counseling and/or coordination of care.      Marene Lenz, MD [facility] Pager [phone]  If 7PM-7AM, please contact night-coverage [email or web address] Password [facility] 03/13/2017, 4:02 PM

## 2017-03-13 NOTE — Telephone Encounter (Signed)
R/s appt per 2/21 sch message - patient is aware of appt date and time.

## 2017-03-14 DIAGNOSIS — K2970 Gastritis, unspecified, without bleeding: Secondary | ICD-10-CM

## 2017-03-14 DIAGNOSIS — C3492 Malignant neoplasm of unspecified part of left bronchus or lung: Secondary | ICD-10-CM

## 2017-03-14 DIAGNOSIS — N183 Chronic kidney disease, stage 3 (moderate): Secondary | ICD-10-CM

## 2017-03-14 DIAGNOSIS — I10 Essential (primary) hypertension: Secondary | ICD-10-CM

## 2017-03-14 DIAGNOSIS — D72829 Elevated white blood cell count, unspecified: Secondary | ICD-10-CM

## 2017-03-14 LAB — BASIC METABOLIC PANEL
Anion gap: 10 (ref 5–15)
BUN: 39 mg/dL — AB (ref 6–20)
CO2: 28 mmol/L (ref 22–32)
Calcium: 9.8 mg/dL (ref 8.9–10.3)
Chloride: 102 mmol/L (ref 101–111)
Creatinine, Ser: 1.39 mg/dL — ABNORMAL HIGH (ref 0.61–1.24)
GFR calc Af Amer: >60 mL/min (ref >60)
GFR, EST NON AFRICAN AMERICAN: 55 mL/min — AB (ref >60)
GLUCOSE: 150 mg/dL — AB (ref 65–99)
POTASSIUM: 5 mmol/L (ref 3.5–5.1)
Sodium: 140 mmol/L (ref 135–145)

## 2017-03-14 LAB — CBC WITH DIFFERENTIAL/PLATELET
Basophils Absolute: 0 10*3/uL (ref 0.0–0.1)
Basophils Relative: 0 %
EOS ABS: 0 10*3/uL (ref 0.0–0.7)
Eosinophils Relative: 0 %
HCT: 35.1 % — ABNORMAL LOW (ref 39.0–52.0)
Hemoglobin: 11 g/dL — ABNORMAL LOW (ref 13.0–17.0)
Lymphocytes Relative: 5 %
Lymphs Abs: 1.2 10*3/uL (ref 0.7–4.0)
MCH: 33.7 pg (ref 26.0–34.0)
MCHC: 31.3 g/dL (ref 30.0–36.0)
MCV: 107.7 fL — AB (ref 78.0–100.0)
MONO ABS: 0.7 10*3/uL (ref 0.1–1.0)
Monocytes Relative: 3 %
NEUTROS ABS: 22.4 10*3/uL — AB (ref 1.7–7.7)
Neutrophils Relative %: 92 %
PLATELETS: 276 10*3/uL (ref 150–400)
RBC: 3.26 MIL/uL — ABNORMAL LOW (ref 4.22–5.81)
RDW: 18 % — ABNORMAL HIGH (ref 11.5–15.5)
WBC: 24.3 10*3/uL — ABNORMAL HIGH (ref 4.0–10.5)

## 2017-03-14 MED ORDER — MOMETASONE FURO-FORMOTEROL FUM 100-5 MCG/ACT IN AERO
2.0000 | INHALATION_SPRAY | Freq: Two times a day (BID) | RESPIRATORY_TRACT | Status: DC
  Administered 2017-03-14 – 2017-03-19 (×10): 2 via RESPIRATORY_TRACT
  Filled 2017-03-14: qty 8.8

## 2017-03-14 NOTE — Progress Notes (Signed)
Patient ID: [name], male   DOB: [birth year], 57 y.o.   MRN: [id number]  PROGRESS NOTE    [name]  QJJ:[id number] DOB: 1960/05/27 DOA: 03/09/2017 PCP: Patient, No Pcp Per   Brief Narrative:  57 year old male with history of stage IV non-small cell lung cancer, spinal metastases status post radiation therapy, undergoing current chemotherapy, Gold stage IV COPD on oxygen intermittently 2-3 L/m nasal cannula, remote history of seizure disorder, diastolic dysfunction by echo on 01/2017, hypertension, gastritis presented with worsening cough with shortness of breath and respiratory distress. Patient was admitted for COPD exacerbation.   Assessment & Plan:   Principal Problem:   COPD with acute exacerbation ([place]) Active Problems:   Seizure ([place])   Tobacco abuse   Adenocarcinoma of left lung, stage 4 (HCC)   Chronic obstructive pulmonary disease (HCC)   HTN (hypertension)   Gastritis   SOB (shortness of breath)   Malnutrition of moderate degree   CKD (chronic kidney disease), stage III (HCC)   COPD exacerbation: Has history of severe COPD Gold stage IV.  Patient is on as needed oxygen at home.  - Patient still complains of shortness of breath with not much improvement compared to yesterday. We will continue on Solu-Medrol 40 mg IV every 12 hours along with scheduled bronchodilators and oral doxycycline. Add Dulera - Currently on 2 L nasal cannula oxygen. Might need continuous home oxygen on discharge -Patient smokes occasionally and was smoking before he presented to the emergency department for dyspnea - CT chest was negative for PE  Leukocytosis: Most likely secondary to steroids.  We will continue to monitor the trend.   blood cultures to rule out bacteremia  Metastatic non-small cell stage IV lung cancer: Follows with [name].  Currently on chemotherapy.  Continue pain medications.  [name] notified about his current situation and he is aware about him. -  Outpatient follow-up with oncology  Hypertension: Currently blood pressure intermittently elevated.  Continue Norvasc, beta-blockers.  History of gastritis: Patient has history of chronic abdominal pain secondary to gastritis.  Continue PPI  CKD probably stage III: His baseline creatinine ranges from 1.3-1.6.  Currently kidney function on baseline. Repeat a.m. Labs  Generalized deconditioning -PT eval   DVT prophylaxis: Heparin Code Status:  Full Family Communication: None at bedside Disposition Plan: Home in 1-2 days  Consultants: None  Procedures: None  Antimicrobials: Doxycycline since 03/10/2017 onwards    Subjective: Patient seen and examined at bedside. He feels weak and tired. Still complains of cough and shortness of breath. No overnight fever or vomiting.  Objective: Vitals:   03/13/17 2228 03/14/17 0207 03/14/17 0640 03/14/17 0757  BP: 132/74  (!) 156/96   Pulse: 62  79   Resp: 18  18   Temp: 98.3 F (36.8 C)  98.1 F (36.7 C)   TempSrc: Oral  Oral   SpO2: 99% 96% 95% 99%  Weight:      Height:        Intake/Output Summary (Last 24 hours) at 03/14/2017 1231 Last data filed at 03/14/2017 1100 Gross per 24 hour  Intake 1885 ml  Output 3300 ml  Net -1415 ml   Filed Weights   03/09/17 1651 03/09/17 2356  Weight: 59 kg (130 lb) 57.3 kg (126 lb 4.8 oz)    Examination:  General exam: Appears calm and comfortable. Thinly built male. Respiratory system: Bilateral decreased breath sound at bases with scattered crackles, occasional wheezing Cardiovascular system: S1 & S2 heard, rate controlled  Gastrointestinal system: Abdomen is nondistended, soft and nontender. Normal bowel sounds heard. Central nervous system: Alert and oriented. No focal neurological deficits. Moving extremities Extremities: No cyanosis, clubbing, edema  Skin: No rashes, lesions or ulcers Lymph: No cervical lymphadenopathy.     Data Reviewed: I have personally reviewed following  labs and imaging studies  CBC: Recent Labs  Lab 03/09/17 1719 03/10/17 0458 03/13/17 0857 03/14/17 0358  WBC 13.2* 10.6* 24.9* 24.3*  NEUTROABS 11.4*  --  22.7* 22.4*  HGB 11.0* 9.8* 12.2* 11.0*  HCT 33.7* 30.7* 38.2* 35.1*  MCV 105.0* 104.4* 107.9* 107.7*  PLT 289 275 254 025   Basic Metabolic Panel: Recent Labs  Lab 03/09/17 1719 03/10/17 0458 03/13/17 0857 03/14/17 0358  NA 141 138 139 140  K 4.1 5.0 4.7 5.0  CL 104 102 98* 102  CO2 25 25 28 28   GLUCOSE 126* 111* 168* 150*  BUN 25* 32* 39* 39*  CREATININE 1.72* 1.68* 1.50* 1.39*  CALCIUM 9.7 9.5 10.0 9.8   GFR: Estimated Creatinine Clearance: 47.5 mL/min (A) (by C-G formula based on SCr of 1.39 mg/dL (H)). Liver Function Tests: Recent Labs  Lab 03/09/17 1719  AST 27  ALT 23  ALKPHOS 159*  BILITOT 0.4  PROT 7.6  ALBUMIN 3.6   No results for input(s): LIPASE, AMYLASE in the last 168 hours. No results for input(s): AMMONIA in the last 168 hours. Coagulation Profile: No results for input(s): INR, PROTIME in the last 168 hours. Cardiac Enzymes: Recent Labs  Lab 03/09/17 1719  TROPONINI <0.03   BNP (last 3 results) No results for input(s): PROBNP in the last 8760 hours. HbA1C: No results for input(s): HGBA1C in the last 72 hours. CBG: No results for input(s): GLUCAP in the last 168 hours. Lipid Profile: No results for input(s): CHOL, HDL, LDLCALC, TRIG, CHOLHDL, LDLDIRECT in the last 72 hours. Thyroid Function Tests: No results for input(s): TSH, T4TOTAL, FREET4, T3FREE, THYROIDAB in the last 72 hours. Anemia Panel: No results for input(s): VITAMINB12, FOLATE, FERRITIN, TIBC, IRON, RETICCTPCT in the last 72 hours. Sepsis Labs: No results for input(s): PROCALCITON, LATICACIDVEN in the last 168 hours.  Recent Results (from the past 240 hour(s))  Culture, blood (routine x 2)     Status: None (Preliminary result)   Collection Time: 03/14/17  7:53 AM  Result Value Ref Range Status   Specimen  Description BLOOD RIGHT ARM  Final   Special Requests   Final    IN PEDIATRIC BOTTLE Blood Culture adequate volume Performed at [facility] [address], [place], [place] [address]    Culture PENDING  Incomplete   Report Status PENDING  Incomplete         Radiology Studies: Ct Angio Chest Pe W Or Wo Contrast  Result Date: 03/13/2017 CLINICAL DATA:  Increasing cough, shortness of breath and respiratory distress in a patient with a history of COPD and metastatic non-small cell lung carcinoma. EXAM: CT ANGIOGRAPHY CHEST WITH CONTRAST TECHNIQUE: Multidetector CT imaging of the chest was performed using the standard protocol during bolus administration of intravenous contrast. Multiplanar CT image reconstructions and MIPs were obtained to evaluate the vascular anatomy. CONTRAST:  100 ml ISOVUE-370 IOPAMIDOL (ISOVUE-370) INJECTION 76% COMPARISON:  CT chest 03/01/2017 and 12/31/2017. Single-view of the chest 03/09/2017. FINDINGS: Cardiovascular: No pulmonary embolus is identified. Heart size is normal. No aortic aneurysm. No pericardial effusion. Mediastinum/Nodes: Thyroid gland appears normal. No lymphadenopathy. Mild thickening of the walls of the is esophagus and changed. Lungs/Pleura:  Small to moderate right pleural effusion is unchanged since the most recent study. No left pleural effusion. Lungs demonstrate severe emphysematous disease. Spiculated left upper lobe nodule measures 2.9 x 1.5 cm on axial image 43 of series 6, unchanged since the most recent exam. Upper Abdomen: As seen on prior examinations but more conspicuous today is fluid adjacent to the celiac axis and posterior to the left hepatic lobe measuring approximately 3.9 cm AP x 3.7 cm transverse. Fluid measures at least 5 cm craniocaudal but is incompletely imaged on this study. 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 Musculoskeletal: Skeletal metastases appear unchanged. No acute bony abnormality. Review of the MIP  images confirms the above findings. IMPRESSION: Negative for pulmonary embolus or acute cardiopulmonary disease. No change in a small to moderate right pleural effusion. No change in a spiculated left upper lobe density compatible with the patient's history of lung carcinoma. Fluid along the celiac axis in the upper abdomen is incompletely imaged. It has been present on prior exams but has increased in size. It may represent loculated ascites in this patient with a cirrhotic liver. If indicated, CT abdomen and pelvis with contrast could be used for further evaluation. Severe emphysema (ICD10-J43.9). Electronically Signed   By: Inge Rise M.D.   On: 03/13/2017 16:16        Scheduled Meds: . amLODipine  10 mg Oral Daily  . bisoprolol  5 mg Oral Daily  . doxycycline  100 mg Oral Q12H  . feeding supplement (ENSURE ENLIVE)  237 mL Oral TID BM  . guaiFENesin  600 mg Oral BID  . heparin injection (subcutaneous)  5,000 Units Subcutaneous Q8H  . ipratropium-albuterol  3 mL Nebulization Q6H  . loratadine  10 mg Oral Daily  . methylPREDNISolone (SOLU-MEDROL) injection  40 mg Intravenous Q12H  . pantoprazole  40 mg Oral BID  . senna-docusate  1 tablet Oral QHS   Continuous Infusions: . sodium chloride 100 mL/hr at 03/14/17 0418     LOS: 5 days        Aline August, MD [facility] Pager [phone]  If 7PM-7AM, please contact night-coverage [email or web address] Password [facility] 03/14/2017, 12:31 PM

## 2017-03-14 NOTE — Evaluation (Signed)
Physical Therapy Evaluation Patient Details Name: [name] MRN: [id number] DOB: [birth year] Today's Date: 03/14/2017   History of Present Illness  57 year old male with history of stage IV non-small cell lung cancer, spinal metastases status post radiation therapy, undergoing current chemotherapy, Gold stage IV COPD on oxygen intermittently 2-3 L/m nasal cannula, remote history of seizure disorder, diastolic dysfunction by echo on 01/2017, hypertension, gastritis presented with worsening cough with shortness of breath and respiratory distress. Patient was admitted for COPD exacerbation.  Clinical Impression  Patient presents with decreased mobility due to limited activity tolerance, decreased balance, decreased strength and will benefit from skilled PT in the acute setting to allow return home.  No follow up PT recommended at this time.     Follow Up Recommendations No PT follow up    Equipment Recommendations  Other (comment)(rollator (4 wheeled walker))    Recommendations for Other Services       Precautions / Restrictions Precautions Precautions: Fall Precaution Comments: oxygen dependent      Mobility  Bed Mobility Overal bed mobility: Modified Independent                Transfers Overall transfer level: Needs assistance   Transfers: Sit to/from Stand Sit to Stand: Supervision         General transfer comment: for safety with lines, etc  Ambulation/Gait Ambulation/Gait assistance: Supervision;Min guard Ambulation Distance (Feet): 120 Feet Assistive device: Rolling walker (2 wheeled) Gait Pattern/deviations: Step-through pattern;Shuffle;Trunk flexed;Decreased stride length     General Gait Details: limited due to SOB with ambulation, see O2 sat documentation   Stairs            Wheelchair Mobility    Modified Rankin (Stroke Patients Only)       Balance Overall balance assessment: Needs assistance   Sitting balance-Leahy Scale: Good        Standing balance-Leahy Scale: Fair                               Pertinent Vitals/Pain Pain Assessment: Faces Faces Pain Scale: Hurts little more Pain Location: generalized Pain Descriptors / Indicators: Aching Pain Intervention(s): Monitored during session;Repositioned    Home Living Family/patient expects to be discharged to:: Assisted living                      Prior Function Level of Independence: Independent               Hand Dominance        Extremity/Trunk Assessment   Upper Extremity Assessment Upper Extremity Assessment: Generalized weakness    Lower Extremity Assessment Lower Extremity Assessment: Generalized weakness    Cervical / Trunk Assessment Cervical / Trunk Assessment: Kyphotic  Communication   Communication: No difficulties  Cognition Arousal/Alertness: Awake/alert Behavior During Therapy: Flat affect Overall Cognitive Status: Within Functional Limits for tasks assessed                                        General Comments      Exercises     Assessment/Plan    PT Assessment Patient needs continued PT services  PT Problem List Decreased activity tolerance;Cardiopulmonary status limiting activity;Decreased knowledge of use of DME;Decreased mobility;Decreased strength;Decreased safety awareness       PT Treatment Interventions DME instruction;Functional mobility training;Balance training;Patient/family education;Gait training;Therapeutic exercise;Therapeutic  activities    PT Goals (Current goals can be found in the Care Plan section)  Acute Rehab PT Goals Patient Stated Goal: to breathe better PT Goal Formulation: With patient Time For Goal Achievement: 03/21/17 Potential to Achieve Goals: Good    Frequency Min 3X/week   Barriers to discharge        Co-evaluation               AM-PAC PT "6 Clicks" Daily Activity  Outcome Measure Difficulty turning over in bed (including  adjusting bedclothes, sheets and blankets)?: None Difficulty moving from lying on back to sitting on the side of the bed? : A Little Difficulty sitting down on and standing up from a chair with arms (e.g., wheelchair, bedside commode, etc,.)?: A Little Help needed moving to and from a bed to chair (including a wheelchair)?: A Little Help needed walking in hospital room?: A Little Help needed climbing 3-5 steps with a railing? : A Little 6 Click Score: 19    End of Session Equipment Utilized During Treatment: Gait belt;Oxygen Activity Tolerance: Patient limited by fatigue Patient left: with call bell/phone within reach;in chair   PT Visit Diagnosis: Other abnormalities of gait and mobility (R26.89)    Time: 3568-6168 PT Time Calculation (min) (ACUTE ONLY): 21 min   Charges:   PT Evaluation $PT Eval Low Complexity: 1 Low     PT G CodesMagda Kiel, [place] [phone] 03/14/2017   Reginia Naas 03/14/2017, 3:01 PM

## 2017-03-14 NOTE — Progress Notes (Signed)
SATURATION QUALIFICATIONS: (This note is used to comply with regulatory documentation for home oxygen)  Patient Saturations on Room Air at Rest = 89%  Patient Saturations on Room Air while Ambulating = 86%  Patient Saturations on 2 Liters of oxygen while Ambulating = 94%  Please briefly explain why patient needs home oxygen:  Patient dyspneic and hypoxic mobilizing on room air.   Magda Kiel, [place] 03/14/2017

## 2017-03-15 LAB — CBC WITH DIFFERENTIAL/PLATELET
BAND NEUTROPHILS: 2 %
BASOS ABS: 0 10*3/uL (ref 0.0–0.1)
BLASTS: 0 %
Basophils Relative: 0 %
EOS ABS: 0 10*3/uL (ref 0.0–0.7)
Eosinophils Relative: 0 %
HEMATOCRIT: 33.6 % — AB (ref 39.0–52.0)
HEMOGLOBIN: 10.9 g/dL — AB (ref 13.0–17.0)
Lymphocytes Relative: 11 %
Lymphs Abs: 2.6 10*3/uL (ref 0.7–4.0)
MCH: 34.6 pg — ABNORMAL HIGH (ref 26.0–34.0)
MCHC: 32.4 g/dL (ref 30.0–36.0)
MCV: 106.7 fL — ABNORMAL HIGH (ref 78.0–100.0)
METAMYELOCYTES PCT: 2 %
Monocytes Absolute: 0.9 10*3/uL (ref 0.1–1.0)
Monocytes Relative: 4 %
Myelocytes: 0 %
Neutro Abs: 20.1 10*3/uL — ABNORMAL HIGH (ref 1.7–7.7)
Neutrophils Relative %: 81 %
Other: 0 %
PROMYELOCYTES ABS: 0 %
Platelets: 252 10*3/uL (ref 150–400)
RBC: 3.15 MIL/uL — ABNORMAL LOW (ref 4.22–5.81)
RDW: 18 % — ABNORMAL HIGH (ref 11.5–15.5)
WBC: 23.6 10*3/uL — ABNORMAL HIGH (ref 4.0–10.5)
nRBC: 0 /100 WBC

## 2017-03-15 LAB — BASIC METABOLIC PANEL
ANION GAP: 11 (ref 5–15)
BUN: 40 mg/dL — ABNORMAL HIGH (ref 6–20)
CALCIUM: 9.7 mg/dL (ref 8.9–10.3)
CHLORIDE: 100 mmol/L — AB (ref 101–111)
CO2: 29 mmol/L (ref 22–32)
Creatinine, Ser: 1.62 mg/dL — ABNORMAL HIGH (ref 0.61–1.24)
GFR calc non Af Amer: 46 mL/min — ABNORMAL LOW (ref >60)
GFR, EST AFRICAN AMERICAN: 53 mL/min — AB (ref >60)
Glucose, Bld: 139 mg/dL — ABNORMAL HIGH (ref 65–99)
Potassium: 4.8 mmol/L (ref 3.5–5.1)
Sodium: 140 mmol/L (ref 135–145)

## 2017-03-15 LAB — MAGNESIUM: Magnesium: 2 mg/dL (ref 1.7–2.4)

## 2017-03-15 NOTE — Progress Notes (Deleted)
Pt. Requesting not to be awoken if sleeping when next treatment is due.  Will call if he needs a treatment.

## 2017-03-15 NOTE — Progress Notes (Signed)
PROGRESS NOTE    [name]  IZT:[id number] DOB: 27-Sep-1960 DOA: 03/09/2017 PCP: Patient, No Pcp Per   Brief Narrative: 57 y.o.malewith medical history significant ofstage IV non-small cell lung cancer,  spinal metastases status post radiation therapy, undergoing current chemotherapy (next treatment on 03/14/2017), Gold stage IV COPD ,on oxygen intermittently on 2-3 L nasal cannula, remote history of seizure disorder, diastolic dysfunction by echo on 01/2017, hypertension, gastritis who comes in with worsening cough, shortness of breath, respiratory distress. Patient was admitted and is currently being managed for COPD exacerbation:   Assessment & Plan:   Principal Problem:   COPD with acute exacerbation ([place]) Active Problems:   Seizure ([place])   Tobacco abuse   Adenocarcinoma of left lung, stage 4 (HCC)   Chronic obstructive pulmonary disease (HCC)   HTN (hypertension)   Gastritis   SOB (shortness of breath)   Malnutrition of moderate degree   CKD (chronic kidney disease), stage III (HCC)  Acute  COPD exacerbation: Has history of severe COPD.  Gold stage IV.  Patient is on as needed oxygen at home.  He  qualifies for continuous home oxygen. Continue scheduled bronchodilators.  Continue steroid. Patient was also started on doxycycline for COPD exacerbation Continues to be dyspneic.  But saturating fine on supplemental oxygen. Patient smokes occasionally and was smoking before he presented to the emergency department for dyspnea Added Dulera  Persistent dyspnea: Complains of persistent dyspnea.  Becomes short of breath on minimal exertion.  Chest x-ray did not show any pneumonia on presentation. CT chest negative for pulmonary embolism.  Leukocytosis: Most likely secondary to steroids.  We will continue to monitor the trend.  Solu-Medrol tapered to 40 mg IV twice daily.  Metastatic non-small cell stage IV lung cancer: Follows with [name].  Currently on chemotherapy.  Next  chemo on 03/14/17. Continue pain medications.  [name] notified about his current situation and he is aware about him.  Hypertension: Currently blood pressure stable.  Continue Norvasc, beta-blockers.  History of gastritis: Patient has history of chronic abdominal pain secondary to gastritis.  Continue PPI  CKD: His baseline creatinine ranges from 1.3-1.6.  Currently kidney function on baseline.    DVT prophylaxis: Heparin Code Status: Full Family Communication: None present at the bedside Disposition Plan: Home likely in 2-3 days  Consultants: None  Procedures:None  Antimicrobials:Doxycycline since 03/10/17  Subjective: Patient seen and examined the bedside this morning.  Still complaining of shortness of breath.  He is coughing also.  He qualified for home oxygen  Objective: Vitals:   03/14/17 2210 03/15/17 0143 03/15/17 0519 03/15/17 0848  BP:   (!) 148/82   Pulse:   66   Resp:   18   Temp:   97.7 F (36.5 C)   TempSrc:   Axillary   SpO2: 98% 99% 95% 100%  Weight:      Height:        Intake/Output Summary (Last 24 hours) at 03/15/2017 1237 Last data filed at 03/15/2017 3825 Gross per 24 hour  Intake 600 ml  Output 2250 ml  Net -1650 ml   Filed Weights   03/09/17 1651 03/09/17 2356  Weight: 59 kg (130 lb) 57.3 kg (126 lb 4.8 oz)    Examination:  General exam: Appears calm and comfortable ,Not in obvious distress,thin built HEENT:PERRL,Oral mucosa moist, Ear/Nose normal on gross exam Respiratory system: Bilateral decreased air entry, scattered wheezes  cardiovascular system: S1 & S2 heard, RRR. No JVD, murmurs, rubs, gallops or  clicks. Gastrointestinal system: Abdomen is nondistended, soft and nontender. No organomegaly or masses felt. Normal bowel sounds heard. Central nervous system: Alert and oriented. No focal neurological deficits. Extremities: No edema, no clubbing ,no cyanosis, distal peripheral pulses palpable. Skin: No rashes, lesions or ulcers,no  icterus ,no pallor MSK: Normal muscle bulk,tone ,power Psychiatry: Judgement and insight appear normal. Mood & affect appropriate.      Data Reviewed: I have personally reviewed following labs and imaging studies  CBC: Recent Labs  Lab 03/09/17 1719 03/10/17 0458 03/13/17 0857 03/14/17 0358 03/15/17 0443  WBC 13.2* 10.6* 24.9* 24.3* 23.6*  NEUTROABS 11.4*  --  22.7* 22.4* 20.1*  HGB 11.0* 9.8* 12.2* 11.0* 10.9*  HCT 33.7* 30.7* 38.2* 35.1* 33.6*  MCV 105.0* 104.4* 107.9* 107.7* 106.7*  PLT 289 275 254 276 277   Basic Metabolic Panel: Recent Labs  Lab 03/09/17 1719 03/10/17 0458 03/13/17 0857 03/14/17 0358 03/15/17 0443  NA 141 138 139 140 140  K 4.1 5.0 4.7 5.0 4.8  CL 104 102 98* 102 100*  CO2 25 25 28 28 29   GLUCOSE 126* 111* 168* 150* 139*  BUN 25* 32* 39* 39* 40*  CREATININE 1.72* 1.68* 1.50* 1.39* 1.62*  CALCIUM 9.7 9.5 10.0 9.8 9.7  MG  --   --   --   --  2.0   GFR: Estimated Creatinine Clearance: 40.8 mL/min (A) (by C-G formula based on SCr of 1.62 mg/dL (H)). Liver Function Tests: Recent Labs  Lab 03/09/17 1719  AST 27  ALT 23  ALKPHOS 159*  BILITOT 0.4  PROT 7.6  ALBUMIN 3.6   No results for input(s): LIPASE, AMYLASE in the last 168 hours. No results for input(s): AMMONIA in the last 168 hours. Coagulation Profile: No results for input(s): INR, PROTIME in the last 168 hours. Cardiac Enzymes: Recent Labs  Lab 03/09/17 1719  TROPONINI <0.03   BNP (last 3 results) No results for input(s): PROBNP in the last 8760 hours. HbA1C: No results for input(s): HGBA1C in the last 72 hours. CBG: No results for input(s): GLUCAP in the last 168 hours. Lipid Profile: No results for input(s): CHOL, HDL, LDLCALC, TRIG, CHOLHDL, LDLDIRECT in the last 72 hours. Thyroid Function Tests: No results for input(s): TSH, T4TOTAL, FREET4, T3FREE, THYROIDAB in the last 72 hours. Anemia Panel: No results for input(s): VITAMINB12, FOLATE, FERRITIN, TIBC, IRON,  RETICCTPCT in the last 72 hours. Sepsis Labs: No results for input(s): PROCALCITON, LATICACIDVEN in the last 168 hours.  Recent Results (from the past 240 hour(s))  Culture, blood (routine x 2)     Status: None (Preliminary result)   Collection Time: 03/14/17  7:53 AM  Result Value Ref Range Status   Specimen Description BLOOD RIGHT ARM  Final   Special Requests   Final    IN PEDIATRIC BOTTLE Blood Culture adequate volume Performed at [facility] [address], [place], [place] [address]    Culture PENDING  Incomplete   Report Status PENDING  Incomplete         Radiology Studies: Ct Angio Chest Pe W Or Wo Contrast  Result Date: 03/13/2017 CLINICAL DATA:  Increasing cough, shortness of breath and respiratory distress in a patient with a history of COPD and metastatic non-small cell lung carcinoma. EXAM: CT ANGIOGRAPHY CHEST WITH CONTRAST TECHNIQUE: Multidetector CT imaging of the chest was performed using the standard protocol during bolus administration of intravenous contrast. Multiplanar CT image reconstructions and MIPs were obtained to evaluate the vascular anatomy.  CONTRAST:  100 ml ISOVUE-370 IOPAMIDOL (ISOVUE-370) INJECTION 76% COMPARISON:  CT chest 03/01/2017 and 12/31/2017. Single-view of the chest 03/09/2017. FINDINGS: Cardiovascular: No pulmonary embolus is identified. Heart size is normal. No aortic aneurysm. No pericardial effusion. Mediastinum/Nodes: Thyroid gland appears normal. No lymphadenopathy. Mild thickening of the walls of the is esophagus and changed. Lungs/Pleura: Small to moderate right pleural effusion is unchanged since the most recent study. No left pleural effusion. Lungs demonstrate severe emphysematous disease. Spiculated left upper lobe nodule measures 2.9 x 1.5 cm on axial image 43 of series 6, unchanged since the most recent exam. Upper Abdomen: As seen on prior examinations but more conspicuous today is fluid adjacent to the celiac axis  and posterior to the left hepatic lobe measuring approximately 3.9 cm AP x 3.7 cm transverse. Fluid measures at least 5 cm craniocaudal but is incompletely imaged on this study. 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 Musculoskeletal: Skeletal metastases appear unchanged. No acute bony abnormality. Review of the MIP images confirms the above findings. IMPRESSION: Negative for pulmonary embolus or acute cardiopulmonary disease. No change in a small to moderate right pleural effusion. No change in a spiculated left upper lobe density compatible with the patient's history of lung carcinoma. Fluid along the celiac axis in the upper abdomen is incompletely imaged. It has been present on prior exams but has increased in size. It may represent loculated ascites in this patient with a cirrhotic liver. If indicated, CT abdomen and pelvis with contrast could be used for further evaluation. Severe emphysema (ICD10-J43.9). Electronically Signed   By: Inge Rise M.D.   On: 03/13/2017 16:16        Scheduled Meds: . amLODipine  10 mg Oral Daily  . bisoprolol  5 mg Oral Daily  . doxycycline  100 mg Oral Q12H  . feeding supplement (ENSURE ENLIVE)  237 mL Oral TID BM  . guaiFENesin  600 mg Oral BID  . heparin injection (subcutaneous)  5,000 Units Subcutaneous Q8H  . ipratropium-albuterol  3 mL Nebulization Q6H  . loratadine  10 mg Oral Daily  . methylPREDNISolone (SOLU-MEDROL) injection  40 mg Intravenous Q12H  . mometasone-formoterol  2 puff Inhalation BID  . pantoprazole  40 mg Oral BID  . senna-docusate  1 tablet Oral QHS   Continuous Infusions:    LOS: 6 days    Time spent:25 minutes. More than 50% of that time was spent in counseling and/or coordination of care.      Marene Lenz, MD [facility] Pager [phone]  If 7PM-7AM, please contact night-coverage [email or web address] Password [facility] 03/15/2017, 12:37 PM

## 2017-03-16 DIAGNOSIS — D72829 Elevated white blood cell count, unspecified: Secondary | ICD-10-CM

## 2017-03-16 LAB — CBC WITH DIFFERENTIAL/PLATELET
BASOS ABS: 0 10*3/uL (ref 0.0–0.1)
Basophils Relative: 0 %
Eosinophils Absolute: 0 10*3/uL (ref 0.0–0.7)
Eosinophils Relative: 0 %
HEMATOCRIT: 34 % — AB (ref 39.0–52.0)
HEMOGLOBIN: 10.9 g/dL — AB (ref 13.0–17.0)
LYMPHS PCT: 5 %
Lymphs Abs: 1.2 10*3/uL (ref 0.7–4.0)
MCH: 33.5 pg (ref 26.0–34.0)
MCHC: 32.1 g/dL (ref 30.0–36.0)
MCV: 104.6 fL — ABNORMAL HIGH (ref 78.0–100.0)
MONOS PCT: 2 %
Monocytes Absolute: 0.5 10*3/uL (ref 0.1–1.0)
Neutro Abs: 23 10*3/uL — ABNORMAL HIGH (ref 1.7–7.7)
Neutrophils Relative %: 93 %
Platelets: 229 10*3/uL (ref 150–400)
RBC: 3.25 MIL/uL — AB (ref 4.22–5.81)
RDW: 17.8 % — ABNORMAL HIGH (ref 11.5–15.5)
WBC: 24.7 10*3/uL — AB (ref 4.0–10.5)

## 2017-03-16 LAB — BASIC METABOLIC PANEL
Anion gap: 13 (ref 5–15)
BUN: 44 mg/dL — AB (ref 6–20)
CHLORIDE: 99 mmol/L — AB (ref 101–111)
CO2: 29 mmol/L (ref 22–32)
Calcium: 9.7 mg/dL (ref 8.9–10.3)
Creatinine, Ser: 1.38 mg/dL — ABNORMAL HIGH (ref 0.61–1.24)
GFR calc Af Amer: >60 mL/min (ref >60)
GFR, EST NON AFRICAN AMERICAN: 55 mL/min — AB (ref >60)
GLUCOSE: 145 mg/dL — AB (ref 65–99)
POTASSIUM: 4.7 mmol/L (ref 3.5–5.1)
SODIUM: 141 mmol/L (ref 135–145)

## 2017-03-16 MED ORDER — GUAIFENESIN ER 600 MG PO TB12
600.0000 mg | ORAL_TABLET | Freq: Two times a day (BID) | ORAL | Status: DC
  Administered 2017-03-16 – 2017-03-19 (×6): 600 mg via ORAL
  Filled 2017-03-16 (×6): qty 1

## 2017-03-16 NOTE — Progress Notes (Signed)
PROGRESS NOTE    [name]  DDU:[id number] DOB: 01/03/1961 DOA: 03/09/2017 PCP: Patient, No Pcp Per   Brief Narrative: 57 y.o.malewith medical history significant ofstage IV non-small cell lung cancer,  spinal metastases status post radiation therapy, undergoing current chemotherapy (next treatment on 03/14/2017), Gold stage IV COPD ,on oxygen intermittently on 2-3 L nasal cannula, remote history of seizure disorder, diastolic dysfunction by echo on 01/2017, hypertension, gastritis who comes in with worsening cough, shortness of breath, respiratory distress. Patient was admitted and is currently being managed for COPD exacerbation:   Assessment & Plan:   Principal Problem:   COPD with acute exacerbation ([place]) Active Problems:   Seizure ([name])   Tobacco abuse   Adenocarcinoma of left lung, stage 4 (HCC)   Chronic obstructive pulmonary disease (HCC)   HTN (hypertension)   Gastritis   SOB (shortness of breath)   Malnutrition of moderate degree   CKD (chronic kidney disease), stage III (HCC)   Leucocytosis  Acute  COPD exacerbation: Has history of severe COPD.  Gold stage IV.  Patient is on as needed oxygen at home.  He  qualifies for continuous home oxygen. Continue scheduled bronchodilators.  Continue steroid. Patient was also started on doxycycline for COPD exacerbation Continues to be dyspneic.  But saturating fine on supplemental oxygen. Patient smokes occasionally and was smoking before he presented to the emergency department for dyspnea Added Dulera  Persistent dyspnea: Complains of persistent dyspnea.  His dyspnea is secondary to his metastatic lung cancer and COPD exacerbation.  Becomes short of breath on minimal exertion.  Chest x-ray did not show any pneumonia on presentation. CT chest negative for pulmonary embolism. Requested for pulmonary evaluation today. Patient has been encouraged to ambulate.  Physical therapy following.  Leukocytosis: Most likely secondary  to steroids.  We will continue to monitor the trend.  Solu-Medrol tapered to 40 mg IV twice daily.  Metastatic non-small cell stage IV lung cancer: Follows with [name].  Currently on chemotherapy.  Next chemo on 03/14/17. Continue pain medications.  [name] notified about his current situation and he is aware about him.  Hypertension: Currently blood pressure stable.  Continue Norvasc, beta-blockers.  History of gastritis: Patient has history of chronic abdominal pain secondary to gastritis.  Continue PPI  CKD: His baseline creatinine ranges from 1.3-1.6.  Currently kidney function on baseline.    DVT prophylaxis: Heparin Code Status: Full Family Communication: None present at the bedside Disposition Plan: Home likely in 2-3 days  Consultants: None  Procedures:None  Antimicrobials:Doxycycline since 03/10/17  Subjective: Patient seen and examined the bedside this morning.  Remains persistently dyspneic.  Also coughing.  Objective: Vitals:   03/15/17 2153 03/16/17 0128 03/16/17 0629 03/16/17 0807  BP: 132/84  125/66   Pulse: 63  63   Resp: 18  18   Temp: 97.7 F (36.5 C)  98.3 F (36.8 C)   TempSrc: Oral  Oral   SpO2: 96% 98% 98% 99%  Weight:      Height:        Intake/Output Summary (Last 24 hours) at 03/16/2017 1142 Last data filed at 03/16/2017 0846 Gross per 24 hour  Intake 480 ml  Output 1700 ml  Net -1220 ml   Filed Weights   03/09/17 1651 03/09/17 2356  Weight: 59 kg (130 lb) 57.3 kg (126 lb 4.8 oz)    Examination:  General exam: The patient mild to moderate respiratory distress, thin, cachectic HEENT:PERRL,Oral mucosa moist, Ear/Nose normal on gross exam  Respiratory system: Bilateral decreased air entry, bilateral coarse breathing sounds , rhonchi  cardiovascular system: S1 & S2 heard, RRR. No JVD, murmurs, rubs, gallops or clicks. Gastrointestinal system: Abdomen is nondistended, soft and nontender. No organomegaly or masses felt. Normal bowel  sounds heard. Central nervous system: Alert and oriented. No focal neurological deficits. Extremities: No edema, no clubbing ,no cyanosis, distal peripheral pulses palpable. Skin: No rashes, lesions or ulcers,no icterus ,no pallor Psychiatry: Judgement and insight appear normal. Mood & affect appropriate.        Data Reviewed: I have personally reviewed following labs and imaging studies  CBC: Recent Labs  Lab 03/09/17 1719 03/10/17 0458 03/13/17 0857 03/14/17 0358 03/15/17 0443 03/16/17 0503  WBC 13.2* 10.6* 24.9* 24.3* 23.6* 24.7*  NEUTROABS 11.4*  --  22.7* 22.4* 20.1* 23.0*  HGB 11.0* 9.8* 12.2* 11.0* 10.9* 10.9*  HCT 33.7* 30.7* 38.2* 35.1* 33.6* 34.0*  MCV 105.0* 104.4* 107.9* 107.7* 106.7* 104.6*  PLT 289 275 254 276 252 378   Basic Metabolic Panel: Recent Labs  Lab 03/10/17 0458 03/13/17 0857 03/14/17 0358 03/15/17 0443 03/16/17 0503  NA 138 139 140 140 141  K 5.0 4.7 5.0 4.8 4.7  CL 102 98* 102 100* 99*  CO2 25 28 28 29 29   GLUCOSE 111* 168* 150* 139* 145*  BUN 32* 39* 39* 40* 44*  CREATININE 1.68* 1.50* 1.39* 1.62* 1.38*  CALCIUM 9.5 10.0 9.8 9.7 9.7  MG  --   --   --  2.0  --    GFR: Estimated Creatinine Clearance: 47.9 mL/min (A) (by C-G formula based on SCr of 1.38 mg/dL (H)). Liver Function Tests: Recent Labs  Lab 03/09/17 1719  AST 27  ALT 23  ALKPHOS 159*  BILITOT 0.4  PROT 7.6  ALBUMIN 3.6   No results for input(s): LIPASE, AMYLASE in the last 168 hours. No results for input(s): AMMONIA in the last 168 hours. Coagulation Profile: No results for input(s): INR, PROTIME in the last 168 hours. Cardiac Enzymes: Recent Labs  Lab 03/09/17 1719  TROPONINI <0.03   BNP (last 3 results) No results for input(s): PROBNP in the last 8760 hours. HbA1C: No results for input(s): HGBA1C in the last 72 hours. CBG: No results for input(s): GLUCAP in the last 168 hours. Lipid Profile: No results for input(s): CHOL, HDL, LDLCALC, TRIG, CHOLHDL,  LDLDIRECT in the last 72 hours. Thyroid Function Tests: No results for input(s): TSH, T4TOTAL, FREET4, T3FREE, THYROIDAB in the last 72 hours. Anemia Panel: No results for input(s): VITAMINB12, FOLATE, FERRITIN, TIBC, IRON, RETICCTPCT in the last 72 hours. Sepsis Labs: No results for input(s): PROCALCITON, LATICACIDVEN in the last 168 hours.  Recent Results (from the past 240 hour(s))  Culture, blood (routine x 2)     Status: None (Preliminary result)   Collection Time: 03/14/17  7:53 AM  Result Value Ref Range Status   Specimen Description BLOOD RIGHT ARM  Final   Special Requests   Final    IN PEDIATRIC BOTTLE Blood Culture adequate volume Performed at [facility] [address] [place], [place] [address]    Culture   Final    NO GROWTH 1 DAY Performed at [facility], [place] [address], [place], [place] [address]    Report Status PENDING  Incomplete  Culture, blood (routine x 2)     Status: None (Preliminary result)   Collection Time: 03/14/17  7:53 AM  Result Value Ref Range Status   Specimen Description  Final    BLOOD LEFT HAND Performed at [facility], [place] [address] [place], [place] [address]    Special Requests   Final    BOTTLES DRAWN AEROBIC ONLY Blood Culture adequate volume Performed at [facility] [address] [place], [place] [address]    Culture   Final    NO GROWTH 1 DAY Performed at [facility], [place] [address], [place], [place] [address]    Report Status PENDING  Incomplete         Radiology Studies: No results found.      Scheduled Meds: . amLODipine  10 mg Oral Daily  . bisoprolol  5 mg Oral Daily  . doxycycline  100 mg Oral Q12H  . feeding supplement (ENSURE ENLIVE)  237 mL Oral TID BM  . guaiFENesin  600 mg Oral BID  . heparin injection (subcutaneous)  5,000 Units Subcutaneous Q8H  . ipratropium-albuterol  3 mL Nebulization Q6H  . loratadine  10 mg Oral Daily    . methylPREDNISolone (SOLU-MEDROL) injection  40 mg Intravenous Q12H  . mometasone-formoterol  2 puff Inhalation BID  . pantoprazole  40 mg Oral BID  . senna-docusate  1 tablet Oral QHS   Continuous Infusions:    LOS: 7 days    Time spent:25 minutes. More than 50% of that time was spent in counseling and/or coordination of care.      Marene Lenz, MD [facility] Pager [phone]  If 7PM-7AM, please contact night-coverage [email or web address] Password [facility] 03/16/2017, 11:42 AM

## 2017-03-17 ENCOUNTER — Inpatient Hospital Stay (HOSPITAL_COMMUNITY): Payer: Medicaid Other

## 2017-03-17 DIAGNOSIS — R0600 Dyspnea, unspecified: Secondary | ICD-10-CM

## 2017-03-17 DIAGNOSIS — R0689 Other abnormalities of breathing: Secondary | ICD-10-CM

## 2017-03-17 LAB — CBC WITH DIFFERENTIAL/PLATELET
Band Neutrophils: 0 %
Basophils Absolute: 0 10*3/uL (ref 0.0–0.1)
Basophils Relative: 0 %
Blasts: 0 %
EOS ABS: 0 10*3/uL (ref 0.0–0.7)
Eosinophils Relative: 0 %
HCT: 33.1 % — ABNORMAL LOW (ref 39.0–52.0)
HEMOGLOBIN: 10.7 g/dL — AB (ref 13.0–17.0)
LYMPHS PCT: 5 %
Lymphs Abs: 1.2 10*3/uL (ref 0.7–4.0)
MCH: 33.4 pg (ref 26.0–34.0)
MCHC: 32.3 g/dL (ref 30.0–36.0)
MCV: 103.4 fL — AB (ref 78.0–100.0)
MONO ABS: 0.2 10*3/uL (ref 0.1–1.0)
MYELOCYTES: 2 %
Metamyelocytes Relative: 0 %
Monocytes Relative: 1 %
NEUTROS ABS: 22 10*3/uL — AB (ref 1.7–7.7)
NEUTROS PCT: 92 %
NRBC: 0 /100{WBCs}
Platelets: 204 10*3/uL (ref 150–400)
Promyelocytes Absolute: 0 %
RBC: 3.2 MIL/uL — AB (ref 4.22–5.81)
RDW: 17.8 % — ABNORMAL HIGH (ref 11.5–15.5)
WBC: 23.4 10*3/uL — AB (ref 4.0–10.5)

## 2017-03-17 MED ORDER — FLUCONAZOLE 100 MG PO TABS
100.0000 mg | ORAL_TABLET | Freq: Every day | ORAL | Status: DC
  Administered 2017-03-17 – 2017-03-19 (×3): 100 mg via ORAL
  Filled 2017-03-17 (×3): qty 1

## 2017-03-17 MED ORDER — MAGIC MOUTHWASH
5.0000 mL | Freq: Four times a day (QID) | ORAL | Status: DC
  Administered 2017-03-17 – 2017-03-19 (×7): 5 mL via ORAL
  Filled 2017-03-17 (×10): qty 5

## 2017-03-17 NOTE — Progress Notes (Addendum)
PROGRESS NOTE    [name]  UXL:[id number] DOB: [birth year] DOA: 03/09/2017 PCP: Patient, No Pcp Per   Brief Narrative: 57 y.o.malewith medical history significant ofstage IV non-small cell lung cancer,  spinal metastases status post radiation therapy, undergoing current chemotherapy (next treatment on 03/14/2017), Gold stage IV COPD ,on oxygen intermittently on 2-3 L nasal cannula, remote history of seizure disorder, diastolic dysfunction by echo on 01/2017, hypertension, gastritis who comes in with worsening cough, shortness of breath, respiratory distress. Patient was admitted and is currently being managed for COPD exacerbation.  He has a prolonged hospital stay secondary to persistent dyspnea.   Assessment & Plan:   Principal Problem:   Dyspnea and respiratory abnormality Active Problems:   Seizure (HCC)   Tobacco abuse   Adenocarcinoma of left lung, stage 4 (HCC)   Chronic obstructive pulmonary disease (HCC)   HTN (hypertension)   Gastritis   COPD with acute exacerbation (HCC)   Malnutrition of moderate degree   CKD (chronic kidney disease), stage III (HCC)   Leucocytosis  Acute  COPD exacerbation: Has history of severe COPD.  Gold stage IV.  Patient is on as needed oxygen at home.  He  qualifies for continuous home oxygen. Continue scheduled bronchodilators.  Continue steroid. Patient was also completed  doxycycline for COPD exacerbation Continues to be dyspneic.  But saturating fine on supplemental oxygen. Patient smokes occasionally and was smoking before he presented to the emergency department for dyspnea.  Persistent dyspnea: Complains of persistent dyspnea.  His dyspnea is likely secondary to his metastatic lung cancer and COPD exacerbation.  Becomes short of breath on minimal exertion.  Chest x-ray did not show any pneumonia on presentation. CT chest negative for pulmonary embolism. Pulmonology evaluated him today ABG shows hypoxia.  Will order echocardiogram as  recommended.  Will repeat chest x-ray Patient has been encouraged to ambulate.  Physical therapy following. We will also request palliative care evaluation because of his  End stage  COPD ,stage IV lung cancer and persistent dyspnea.  Leukocytosis: Most likely secondary to steroids.  We will continue to monitor the trend.  Solu-Medrol tapered to 40 mg IV twice daily. Patient is afebrile.  His blood cultures are negative.  Metastatic non-small cell stage IV lung cancer: Follows with [name].  Currently on chemotherapy.  Next chemo on 03/14/17. Continue pain medications.  [name] notified about his current situation and he is aware about him.  Hypertension: Currently blood pressure stable.  Continue Norvasc, beta-blockers.  History of gastritis: Patient has history of chronic abdominal pain secondary to gastritis.  Continue PPI  CKD: His baseline creatinine ranges from 1.3-1.6.  Currently kidney function on baseline.    DVT prophylaxis: Heparin Code Status: Full Family Communication: None present at the bedside Disposition Plan: Is to be determined Consultants: None  Procedures:None  Antimicrobials:Doxycycline since 03/10/17-03/16/17  Subjective: Patient seen and examined the bedside this morning.  Remains dyspneic although  his respiratory status has slightly improved this morning.  Objective: Vitals:   03/17/17 0918 03/17/17 0954 03/17/17 1000 03/17/17 1017  BP: (!) 145/77     Pulse: 96 95 (!) 108 88  Resp: 20     Temp:      TempSrc:      SpO2: 98% 94% 93% 92%  Weight:      Height:        Intake/Output Summary (Last 24 hours) at 03/17/2017 1147 Last data filed at 03/17/2017 0838 Gross per 24 hour  Intake 120 ml  Output 1575 ml  Net -1455 ml   Filed Weights   03/09/17 1651 03/09/17 2356  Weight: 59 kg (130 lb) 57.3 kg (126 lb 4.8 oz)    Examination:  General exam: In mild respiratory  Distress,thin built, sitting on the chair HEENT:PERRL,Oral mucosa moist,  Ear/Nose normal on gross exam Respiratory system: Bilateral decreased air entry  cardiovascular system: S1 & S2 heard, RRR. No JVD, murmurs, rubs, gallops or clicks. Gastrointestinal system: Abdomen is nondistended, soft and nontender. No organomegaly or masses felt. Normal bowel sounds heard. Central nervous system: Alert and oriented. No focal neurological deficits. Extremities: No edema, no clubbing ,no cyanosis, distal peripheral pulses palpable. Skin: No rashes, lesions or ulcers,no icterus ,no pallor MSK: Normal muscle bulk,tone ,power Psychiatry: Judgement and insight appear normal. Mood & affect appropriate.         Data Reviewed: I have personally reviewed following labs and imaging studies  CBC: Recent Labs  Lab 03/13/17 0857 03/14/17 0358 03/15/17 0443 03/16/17 0503 03/17/17 0445  WBC 24.9* 24.3* 23.6* 24.7* 23.4*  NEUTROABS 22.7* 22.4* 20.1* 23.0* 22.0*  HGB 12.2* 11.0* 10.9* 10.9* 10.7*  HCT 38.2* 35.1* 33.6* 34.0* 33.1*  MCV 107.9* 107.7* 106.7* 104.6* 103.4*  PLT 254 276 252 229 102   Basic Metabolic Panel: Recent Labs  Lab 03/13/17 0857 03/14/17 0358 03/15/17 0443 03/16/17 0503  NA 139 140 140 141  K 4.7 5.0 4.8 4.7  CL 98* 102 100* 99*  CO2 28 28 29 29   GLUCOSE 168* 150* 139* 145*  BUN 39* 39* 40* 44*  CREATININE 1.50* 1.39* 1.62* 1.38*  CALCIUM 10.0 9.8 9.7 9.7  MG  --   --  2.0  --    GFR: Estimated Creatinine Clearance: 47.9 mL/min (A) (by C-G formula based on SCr of 1.38 mg/dL (H)). Liver Function Tests: No results for input(s): AST, ALT, ALKPHOS, BILITOT, PROT, ALBUMIN in the last 168 hours. No results for input(s): LIPASE, AMYLASE in the last 168 hours. No results for input(s): AMMONIA in the last 168 hours. Coagulation Profile: No results for input(s): INR, PROTIME in the last 168 hours. Cardiac Enzymes: No results for input(s): CKTOTAL, CKMB, CKMBINDEX, TROPONINI in the last 168 hours. BNP (last 3 results) No results for input(s):  PROBNP in the last 8760 hours. HbA1C: No results for input(s): HGBA1C in the last 72 hours. CBG: No results for input(s): GLUCAP in the last 168 hours. Lipid Profile: No results for input(s): CHOL, HDL, LDLCALC, TRIG, CHOLHDL, LDLDIRECT in the last 72 hours. Thyroid Function Tests: No results for input(s): TSH, T4TOTAL, FREET4, T3FREE, THYROIDAB in the last 72 hours. Anemia Panel: No results for input(s): VITAMINB12, FOLATE, FERRITIN, TIBC, IRON, RETICCTPCT in the last 72 hours. Sepsis Labs: No results for input(s): PROCALCITON, LATICACIDVEN in the last 168 hours.  Recent Results (from the past 240 hour(s))  Culture, blood (routine x 2)     Status: None (Preliminary result)   Collection Time: 03/14/17  7:53 AM  Result Value Ref Range Status   Specimen Description BLOOD RIGHT ARM  Final   Special Requests   Final    IN PEDIATRIC BOTTLE Blood Culture adequate volume Performed at [facility] [address] [place], [place] [address]    Culture   Final    NO GROWTH 2 DAYS Performed at [facility] [address], [place], [place] [address]    Report Status PENDING  Incomplete  Culture, blood (routine x 2)     Status:  None (Preliminary result)   Collection Time: 03/14/17  7:53 AM  Result Value Ref Range Status   Specimen Description   Final    BLOOD LEFT HAND Performed at [facility] [address] [place], [place] [address]    Special Requests   Final    BOTTLES DRAWN AEROBIC ONLY Blood Culture adequate volume Performed at [facility] [address], [place], [place] [address]    Culture   Final    NO GROWTH 2 DAYS Performed at [facility] [address] [place], [place] [address]    Report Status PENDING  Incomplete         Radiology Studies: No results found.      Scheduled Meds: . amLODipine  10 mg Oral Daily  . bisoprolol  5 mg Oral Daily  . feeding supplement (ENSURE ENLIVE)   237 mL Oral TID BM  . fluconazole  100 mg Oral Daily  . guaiFENesin  600 mg Oral BID  . heparin injection (subcutaneous)  5,000 Units Subcutaneous Q8H  . ipratropium-albuterol  3 mL Nebulization Q6H  . loratadine  10 mg Oral Daily  . methylPREDNISolone (SOLU-MEDROL) injection  40 mg Intravenous Q12H  . mometasone-formoterol  2 puff Inhalation BID  . pantoprazole  40 mg Oral BID  . senna-docusate  1 tablet Oral QHS   Continuous Infusions:    LOS: 8 days    Time spent:25 minutes. More than 50% of that time was spent in counseling and/or coordination of care.      Marene Lenz, MD [facility] Pager [phone]  If 7PM-7AM, please contact night-coverage [email or web address] Password [facility] 03/17/2017, 11:47 AM

## 2017-03-17 NOTE — Progress Notes (Signed)
Nutrition Follow-up  DOCUMENTATION CODES:   Underweight, Non-severe (moderate) malnutrition in context of chronic illness  INTERVENTION:   Ensure Enlive po TID, each supplement provides 350 kcal and 20 grams of protein  NUTRITION DIAGNOSIS:   Moderate Malnutrition related to chronic illness, cancer and cancer related treatments as evidenced by percent weight loss, moderate fat depletion, moderate muscle depletion.  Ongoing  GOAL:   Patient will meet greater than or equal to 90% of their needs  Meeting   MONITOR:   PO intake, Weight trends, Labs, Supplement acceptance  REASON FOR ASSESSMENT:   Consult COPD Protocol  ASSESSMENT:   Pt with PMH significant for stage IV adenocarcinoma of the lung gated by spinal metastases s/p radiation (currently on chemotherapy), Gold stage IV COPD, HTN, seizures, and erosive gastropathy/gastritis. Presents this admission with worsening cough, SOB, and respiratory distress. Admitted for acute on chronic COPD exacerbation.   Pt reports appetite remains great despite having thrush (currently being treated). Pt denies any issues with swallowing. Records indicate pt has ate 100% at each meal since admission. Pt had eggs and grits this morning without complication. Pt does not like Ensure but continues to drink them so he can gain weight. A recent wt has not been obtained since last RD visit.   Medications reviewed and include: solumedrol, senokot Labs reviewed: BUN 44 (H) Creatinine 1.38 (H)   Diet Order:  Diet regular Room service appropriate? Yes; Fluid consistency: Thin  EDUCATION NEEDS:   Education needs have been addressed  Skin:  Skin Assessment: Reviewed RN Assessment  Last BM:  03/14/17  Height:   Ht Readings from Last 1 Encounters:  03/09/17 5\' 11"  (1.803 m)    Weight:   Wt Readings from Last 1 Encounters:  03/09/17 126 lb 4.8 oz (57.3 kg)    Ideal Body Weight:  78.2 kg  BMI:  Body mass index is 17.62  kg/m.  Estimated Nutritional Needs:   Kcal:  1750-1950 kcal/day  Protein:  85-95 g/day  Fluid:  >1.7 L/day    [name] RD, LDN Clinical Nutrition Pager # - [phone]

## 2017-03-17 NOTE — Consult Note (Signed)
PULMONARY / CRITICAL CARE MEDICINE   Name: [name] MRN: [id number] DOB: March 06, 1960 PCP Patient, No Pcp Per LOS 8 as of 03/17/2017     ADMISSION DATE:  03/09/2017 CONSULTATION DATE:  03/17/2017   REFERRING MD:  [facility] hospitalist Dr Tawanna Solo  CHIEF COMPLAINT:  Class 4 dyspnea in setting of stage 4 copd and stage 4 NSCLCC  HISTORY OF PRESENT ILLNESS:   57 year old African-American male.  History is obtained from talking to him and his bedside nurse and review of the chart.  He seems to be a fairly decent historian.  As best as I can gather it appears that he has Gold stage IV COPD based on July 2018 spirometry FEV1 1.1 L / 33%.  But he says he uses oxygen only as needed.  Review of the chart shows he has had recurrent admissions in the last few months for respiratory issues.  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  This was diagnosed in February 2018.  His stage IV diagnosis based on T3, N3, and M1.  As best as I can gather last visit was with [name] in December 2018 when his chemotherapy Alimta was postponed because of raised liver function.  He had completed palliative radiotherapy to the T12 and L3 vertebrae in May 2018.  He had been on single agent Alimta since September 2018.  He has had 3 admissions since Thanksgiving 2018 and the current one being the 4th.  Thanksgiving 2018 admission was for hematemesis.  In early December 2018 admission for dyspnea and hypoxemia with diagnosis of pulmonary edema and right pleural effusion [he has had 2 thoracentesis 1 on January 03, 2017 and the second 1 February 14, 2017 both are suggestive of exudate based on LDH cytology was atypical cells suggestive of reactive mesothelial cells but nondiagnostic for malignancy].  Discharged on oxygen.  It is to be noted that serial CT chest showed the new onset of pleural effusion on the right side around early  December 2018/thanks giving 2018.  [Personally visualized the CT chest].  Then another admission end of January 2018 for diagnosis of healthcare acquired pneumonia.  After his discharge he tells me that his dyspnea has deteriorated.  Some months ago he rated his dyspnea is 4 out of 10.  A month ago his dyspnea was 7 out of 10.  At admission it was 9 out of 10.  And since admission despite all intervention it is hovering around 8 out of 10.  He feels that he needs oxygen for any exertion although the nurse tells me that hypoxemia on room air has not been documented.  He feels unimproved and reports class III-4 dyspnea.  But since admission cough is improved, wheezing is improved and sputum yellow color has improved.  Reports weight loss Echo normal - ef 55% nov 2018 wut elevated PASP  PAST MEDICAL HISTORY :  He  has a past medical history of Abdominal pain (06/04/2016), Adenocarcinoma of left lung, stage 4 ([place]) (05/02/2016), Bronchitis due to tobacco use ([place]), Cancer ([place]), Dehydration (06/04/2016), Encounter for antineoplastic chemotherapy (05/02/2016), Goals of care, counseling/discussion (05/02/2016), HTN (hypertension) (10/30/2016), Recurrent upper respiratory infection ([facility]), Seizures ([place]) (05/2011), and Shortness of breath.  PAST SURGICAL HISTORY: He  has a past surgical history that includes No past surgeries; IR FLUORO GUIDE PORT INSERTION RIGHT (05/13/2016); IR US Guide Vasc Access Right (05/13/2016); and Esophagogastroduodenoscopy (egd) with propofol (N/A, 12/17/2016).  No Known Allergies  No current facility-administered medications on file prior to encounter.    Current Outpatient Medications on File Prior to Encounter  Medication Sig  . acetaminophen (TYLENOL) 500 MG tablet Take 500 mg by mouth 2 (two) times daily as needed for mild pain or headache.  Marland Kitchen amLODipine (NORVASC) 10 MG tablet Take 10 mg by mouth daily.  . bisoprolol (ZEBETA) 5 MG tablet Take 1 tablet (5 mg total) by mouth  daily.  . Chlorphen-Phenyleph-ASA (ALKA-SELTZER PLUS COLD PO) Take 2 tablets by mouth at bedtime as needed (COUGH).  Marland Kitchen dexamethasone (DECADRON) 4 MG tablet Take 4 mg by mouth as directed. Take 1 tablet twice daily the day before, the day of and the day after chemotherapy every 3 weeks.  . furosemide (LASIX) 40 MG tablet Take 1 tablet (40 mg total) by mouth every three (3) days as needed for fluid or edema.  Marland Kitchen guaiFENesin (MUCINEX) 600 MG 12 hr tablet Take 1 tablet (600 mg total) by mouth 2 (two) times daily.  . Ipratropium-Albuterol (COMBIVENT) 20-100 MCG/ACT AERS respimat Inhale 1 puff into the lungs every 6 (six) hours.  . lidocaine-prilocaine (EMLA) cream Apply 1 application topically as needed.  . loratadine (CLARITIN) 10 MG tablet Take 10 mg by mouth daily.  . mometasone-formoterol (DULERA) 100-5 MCG/ACT AERO Inhale 2 puffs into the lungs 2 (two) times daily.  Marland Kitchen morphine (MSIR) 15 MG tablet Take 15 mg by mouth every 4 (four) hours as needed for severe pain.  Marland Kitchen ondansetron (ZOFRAN-ODT) 4 MG disintegrating tablet Take 4 mg by mouth every 8 (eight) hours as needed for nausea or vomiting.  . pantoprazole (PROTONIX) 40 MG tablet Take 1 tablet (40 mg total) by mouth 2 (two) times daily.  Marland Kitchen senna-docusate (SENNA-PLUS) 8.6-50 MG tablet Take 1 tablet by mouth at bedtime.  . predniSONE (DELTASONE) 10 MG tablet Label  & dispense according to the schedule below. 4 Pills PO on day one then, 3 Pills PO on day two, 2 Pills PO on day three, 1Pills PO on day four,  then STOP.  Total of 10 tabs (Patient not taking: Reported on 03/09/2017)    FAMILY HISTORY:  His indicated that the status of his mother is unknown. He indicated that the status of his father is unknown.   SOCIAL HISTORY: He  reports that he has been smoking cigarettes.  He has a 3.00 pack-year smoking history. he has never used smokeless tobacco. He reports that he does not drink alcohol or use drugs.  REVIEW OF SYSTEMS:   As per history of  present illness otherwise negative.   VITAL SIGNS: BP (!) 145/77 (BP Location: Right Arm)   Pulse 96   Temp 98 F (36.7 C) (Oral)   Resp 20   Ht 5\' 11"  (1.803 m)   Wt 57.3 kg (126 lb 4.8 oz)   SpO2 98%   BMI 17.62 kg/m   HEMODYNAMICS:    VENTILATOR SETTINGS:    INTAKE / OUTPUT: I/O last 3 completed shifts: In: 240 [P.O.:240] Out: 2575 [Urine:2575]     EXAM  General Appearance:    Looks well.  Sitting in the chair and talking  Head:    Normocephalic, without obvious abnormality, atraumatic  Eyes:    PERRL -yes, conjunctiva/corneas -clear     Ears:    Normal external ear canals, both ears  Nose:   NG tube -normal but has oxygen on  Throat:  ETT TUBE -no, OG tube -no.  Has significant oral thrush  started on oral Diflucan today.  Neck:   Supple,  No enlargement/tenderness/nodules     Lungs:     Clear to auscultation bilaterally, oral expiration prolonged.  No wheezing.  Right air entry somewhat diminished but overall equal  Chest wall:    No deformity  Heart:    S1 and S2 normal, no murmur, CVP -no.  Pressors -no  Abdomen:     Soft, no masses, no organomegaly  Genitalia:    Not done  Rectal:   not done  Extremities:   Extremities-no cyanosis no clubbing no edema     Skin:   Intact in exposed areas .      Neurologic:   Sedation -none->  . Moves all 4s -yes. CAM-ICU -negative for delirium. Orientation -x3+       LABS  PULMONARY No results for input(s): PHART, PCO2ART, PO2ART, HCO3, TCO2, O2SAT in the last 168 hours.  Invalid input(s): PCO2, PO2  CBC Recent Labs  Lab 03/15/17 0443 03/16/17 0503 03/17/17 0445  HGB 10.9* 10.9* 10.7*  HCT 33.6* 34.0* 33.1*  WBC 23.6* 24.7* 23.4*  PLT 252 229 204    COAGULATION No results for input(s): INR in the last 168 hours.  CARDIAC  No results for input(s): TROPONINI in the last 168 hours. No results for input(s): PROBNP in the last 168 hours.   CHEMISTRY Recent Labs  Lab 03/13/17 0857 03/14/17 0358  03/15/17 0443 03/16/17 0503  NA 139 140 140 141  K 4.7 5.0 4.8 4.7  CL 98* 102 100* 99*  CO2 28 28 29 29   GLUCOSE 168* 150* 139* 145*  BUN 39* 39* 40* 44*  CREATININE 1.50* 1.39* 1.62* 1.38*  CALCIUM 10.0 9.8 9.7 9.7  MG  --   --  2.0  --    Estimated Creatinine Clearance: 47.9 mL/min (A) (by C-G formula based on SCr of 1.38 mg/dL (H)).   LIVER No results for input(s): AST, ALT, ALKPHOS, BILITOT, PROT, ALBUMIN, INR in the last 168 hours.   INFECTIOUS No results for input(s): LATICACIDVEN, PROCALCITON in the last 168 hours.   ENDOCRINE CBG (last 3)  No results for input(s): GLUCAP in the last 72 hours.       IMAGING x48h  - image(s) personally visualized  -   highlighted in bold No results found.     ASSESSMENT and PLAN  Dyspnea and respiratory abnormality He has stage IV non-small cell lung cancer associated with gold stage IV COPD and a new right pleural effusion that is small-moderate since late November 2018.  ECho nov 2018 also cor pulmolame.  On top of this he has had multiple admissions since Thanksgiving 2018.  He admits to some weight loss.  Therefore a combination of COPD, lung cancer, effusion and physical deconditioning and elevated PASP can all easily explain his class III-4 dyspnea.  Plan Check abg - if hypercapnic - night bipap can help Check pulse ox on room air and with walk Repeat CXR (last 2/17) - if effusion significant  -do diagnostic and repeat thora (noted atypical cells in past) and if malignant will beneift from pleurX Based on above; consider repeat echo rule out new onset systolic chf + /- palliative support for dyspnea Consider inpatietn rehab v hospice v home pallaiative support (depending on above and goals)      FAMILY  - Updates: 03/17/2017 --> pateient  - Inter-disciplinary family meet or Palliative Care meeting due by:  DAy 7. Current LOS is LOS 8 days  CODE  STATUS    Code Status Orders  (From admission, onward)         Start     Ordered   03/09/17 2359  Full code  Continuous     03/09/17 2359    Code Status History    Date Active Date Inactive Code Status Order ID Comments User Context   02/14/2017 09:02 02/16/2017 14:55 Full Code [id number]  Florencia Reasons, MD Inpatient   02/10/2017 00:31 02/14/2017 09:02 DNR [id number]  Norval Morton, MD ED   12/30/2016 23:25 01/07/2017 21:09 DNR [id number]  Elodia [name]., MD Inpatient   12/17/2016 09:34 12/24/2016 18:11 DNR [id number]  Thurnell Lose, MD Inpatient   12/16/2016 16:49 12/17/2016 09:34 Full Code [id number]  Charlynne Cousins, MD Inpatient        DISPO floor     Dr. Brand Males, M.D., F.C.C.P Pulmonary and Critical Care Medicine Staff Physician, [place] Director - Interstitial Lung Disease  Program  Pulmonary [place] [facility] [place], [place], [address]  Pager: [phone], If no answer or between  15:00h - 7:00h: call [phone] Telephone: [phone]

## 2017-03-17 NOTE — Progress Notes (Signed)
Palliative Medicine consult noted. Due to high referral volume, there may be a delay seeing this patient. Please call the Palliative Medicine Team office at [phone] if recommendations are needed in the interim.  Thank you for inviting [place] to see this patient.  Marjie Skiff Griselle Rufer, RN, BSN, [facility] Palliative Medicine Team 03/17/2017 12:43 PM Office [phone]

## 2017-03-17 NOTE — Assessment & Plan Note (Addendum)
He has stage IV non-small cell lung cancer associated with gold stage IV COPD.  ECho nov 2018 also cor pulmolame. Also a new right pleural effusion that is small-moderate since late November 2018; transudate v bordeline exudat on tap in dec 2018 wuith atypocal cells.   On top of this he has had multiple admissions since Thanksgiving 2018.  He admits to some weight loss.    Therefore a combination of COPD, lung cancer,and physical deconditioning and elevated PASP can all easily explain his class III-4 dyspnea. The role of the pleural effusion in dyspnea is + but very minor. No hypercapnia on ABG .03/17/17  In addition, I think chronic pain is cotnributing to dyspnea  Plan No role for bipap qhs due to lack of hypercapnia Continue copd Rx: add spiriva to dulera. Change nebs to prn. No need for resting o2 . Mnitor need for o2 with exertion Monitor pleural effusion clinically with time  Refer inpatient rehab if he is a candidate for deconditioning Address pain - by [facility] MD; might consider po home opiids if cancer pain Goals of care recommended

## 2017-03-17 NOTE — Plan of Care (Signed)
  Nutrition: Adequate nutrition will be maintained 03/17/2017 1026 - Progressing by Dorene Sorrow, RN   Safety: Ability to remain free from injury will improve 03/17/2017 1026 - Progressing by Dorene Sorrow, RN   Skin Integrity: Risk for impaired skin integrity will decrease 03/17/2017 1026 - Progressing by Dorene Sorrow, RN

## 2017-03-17 NOTE — Progress Notes (Signed)
CSW following to assist patient with discharge back to [name]. Patient not medically stable, CSW provided update to [name]. CSW will continue to follow and assist with discharge planning.  Abundio Miu, [place] Social Worker [facility] Cell#: [phone]

## 2017-03-17 NOTE — Progress Notes (Signed)
Physical Therapy Treatment Patient Details Name: [name] MRN: [id number] DOB: 09-24-1960 Today's Date: 03/17/2017    History of Present Illness 57 year old male with history of stage IV non-small cell lung cancer, spinal metastases status post radiation therapy, undergoing current chemotherapy, Gold stage IV COPD on oxygen intermittently 2-3 L/m nasal cannula, remote history of seizure disorder, diastolic dysfunction by echo on 01/2017, hypertension, gastritis presented with worsening cough with shortness of breath and respiratory distress. Patient was admitted for COPD exacerbation.    PT Comments    Pt in recliner on 2lts nasal at 96%.  Pt reluctant to remove O2 stating "I need it".  Assisted with amb full unit and shut O2 tank off for trial RA.     SATURATION QUALIFICATIONS: (This note is used to comply with regulatory documentation for home oxygen)  Patient Saturations on Room Air at Rest = 94%  Patient Saturations on Room Air while Ambulating = 90%  Patient Saturations on          Liters of oxygen while Ambulating 500 feet =  Please briefly explain why patient needs home oxygen:  Pt  did not require supplemental oxygen during ambulation   Pt amb without any AD but did steady self on wall rail a couple of time.   Mild dyspnea with avg HR 100.     Follow Up Recommendations  No PT follow up     Equipment Recommendations       Recommendations for Other Services       Precautions / Restrictions Precautions Precautions: Fall Precaution Comments: monitor O2 Restrictions Weight Bearing Restrictions: No    Mobility  Bed Mobility               General bed mobility comments: OOB in recliner   Transfers Overall transfer level: Needs assistance Equipment used: None Transfers: Sit to/from Stand Sit to Stand: Supervision         General transfer comment: for safety with lines, etc  Ambulation/Gait Ambulation/Gait assistance: Supervision;Min  guard Ambulation Distance (Feet): 500 Feet Assistive device: None Gait Pattern/deviations: Step-to pattern;Step-through pattern Gait velocity: decreased   General Gait Details: tolerated an increased distance, declined need for walker.  Amb in hallway without AD however had to stop 5 times (standing rest break) due to mild dyspnea as well as occassional use of hallway rail to steady self.  Amb on RA lowest sat was 89% with avg 90% and HR 100.   Stairs            Wheelchair Mobility    Modified Rankin (Stroke Patients Only)       Balance                                            Cognition Arousal/Alertness: Awake/alert Behavior During Therapy: WFL for tasks assessed/performed Overall Cognitive Status: Within Functional Limits for tasks assessed                                        Exercises      General Comments        Pertinent Vitals/Pain Pain Assessment: No/denies pain    Home Living                      Prior  Function            PT Goals (current goals can now be found in the care plan section) Progress towards PT goals: Progressing toward goals    Frequency    Min 3X/week      PT Plan Current plan remains appropriate    Co-evaluation              AM-PAC PT "6 Clicks" Daily Activity  Outcome Measure  Difficulty turning over in bed (including adjusting bedclothes, sheets and blankets)?: None Difficulty moving from lying on back to sitting on the side of the bed? : A Little Difficulty sitting down on and standing up from a chair with arms (e.g., wheelchair, bedside commode, etc,.)?: A Little Help needed moving to and from a bed to chair (including a wheelchair)?: A Little Help needed walking in hospital room?: A Little Help needed climbing 3-5 steps with a railing? : A Little 6 Click Score: 19    End of Session Equipment Utilized During Treatment: Gait belt Activity Tolerance: Patient  tolerated treatment well Patient left: in chair;with call bell/phone within reach Nurse Communication: Mobility status PT Visit Diagnosis: Other abnormalities of gait and mobility (R26.89)     Time: 7530-0511 PT Time Calculation (min) (ACUTE ONLY): 29 min  Charges:  $Gait Training: 8-22 mins $Therapeutic Activity: 8-22 mins                    G Codes:       Rica Koyanagi  PTA [facility]  Acute  Rehab Pager      [phone]

## 2017-03-18 ENCOUNTER — Inpatient Hospital Stay (HOSPITAL_COMMUNITY): Payer: Medicaid Other

## 2017-03-18 DIAGNOSIS — Z515 Encounter for palliative care: Secondary | ICD-10-CM

## 2017-03-18 DIAGNOSIS — Z7189 Other specified counseling: Secondary | ICD-10-CM

## 2017-03-18 DIAGNOSIS — I5030 Unspecified diastolic (congestive) heart failure: Secondary | ICD-10-CM

## 2017-03-18 LAB — CBC WITH DIFFERENTIAL/PLATELET
BASOS ABS: 0 10*3/uL (ref 0.0–0.1)
Basophils Relative: 0 %
Eosinophils Absolute: 0 10*3/uL (ref 0.0–0.7)
Eosinophils Relative: 0 %
HCT: 33.6 % — ABNORMAL LOW (ref 39.0–52.0)
Hemoglobin: 10.8 g/dL — ABNORMAL LOW (ref 13.0–17.0)
LYMPHS ABS: 0.9 10*3/uL (ref 0.7–4.0)
Lymphocytes Relative: 4 %
MCH: 33.3 pg (ref 26.0–34.0)
MCHC: 32.1 g/dL (ref 30.0–36.0)
MCV: 103.7 fL — ABNORMAL HIGH (ref 78.0–100.0)
MONO ABS: 0.5 10*3/uL (ref 0.1–1.0)
Monocytes Relative: 2 %
NEUTROS ABS: 21.5 10*3/uL — AB (ref 1.7–7.7)
Neutrophils Relative %: 94 %
PLATELETS: 188 10*3/uL (ref 150–400)
RBC: 3.24 MIL/uL — AB (ref 4.22–5.81)
RDW: 18 % — AB (ref 11.5–15.5)
WBC: 22.9 10*3/uL — AB (ref 4.0–10.5)

## 2017-03-18 LAB — BLOOD GAS, ARTERIAL
ACID-BASE EXCESS: 4.4 mmol/L — AB (ref 0.0–2.0)
Bicarbonate: 28.5 mmol/L — ABNORMAL HIGH (ref 20.0–28.0)
Drawn by: 317771
O2 SAT: 90.5 %
PH ART: 7.436 (ref 7.350–7.450)
pCO2 arterial: 43 mmHg (ref 32.0–48.0)
pO2, Arterial: 63 mmHg — ABNORMAL LOW (ref 83.0–108.0)

## 2017-03-18 LAB — ECHOCARDIOGRAM COMPLETE
Height: 71 in
WEIGHTICAEL: 2020.8 [oz_av]

## 2017-03-18 MED ORDER — OXYCODONE HCL 5 MG PO TABS
10.0000 mg | ORAL_TABLET | ORAL | Status: DC | PRN
  Administered 2017-03-18 – 2017-03-19 (×4): 10 mg via ORAL
  Filled 2017-03-18 (×4): qty 2

## 2017-03-18 MED ORDER — PREDNISONE 20 MG PO TABS
40.0000 mg | ORAL_TABLET | Freq: Every day | ORAL | Status: DC
Start: 2017-03-19 — End: 2017-03-19
  Administered 2017-03-19: 40 mg via ORAL
  Filled 2017-03-18: qty 2

## 2017-03-18 MED ORDER — TIOTROPIUM BROMIDE MONOHYDRATE 18 MCG IN CAPS
18.0000 ug | ORAL_CAPSULE | Freq: Every day | RESPIRATORY_TRACT | Status: DC
  Filled 2017-03-18: qty 5

## 2017-03-18 NOTE — Progress Notes (Signed)
  Echocardiogram 2D Echocardiogram has been performed.  [name] M 03/18/2017, 11:57 AM

## 2017-03-18 NOTE — Progress Notes (Addendum)
PROGRESS NOTE    [name]  JQG:[id number] DOB: 09-05-1960 DOA: 03/09/2017 PCP: Patient, No Pcp Per   Brief Narrative: 57 y.o.malewith medical history significant ofstage IV non-small cell lung cancer,  spinal metastases status post radiation therapy, undergoing current chemotherapy (next treatment was  on 03/14/2017), Gold stage IV COPD ,on oxygen intermittently on 2-3 L nasal cannula, remote history of seizure disorder, diastolic dysfunction by echo on 01/2017, hypertension, gastritis who comes in with worsening cough, shortness of breath, respiratory distress. Patient was admitted and is currently being managed for COPD exacerbation.  He has a prolonged hospital stay secondary to persistent dyspnea.Awaiting palliative care evaluation.   Assessment & Plan:   Principal Problem:   Dyspnea and respiratory abnormality Active Problems:   Seizure (HCC)   Tobacco abuse   Adenocarcinoma of left lung, stage 4 (HCC)   Chronic obstructive pulmonary disease (HCC)   HTN (hypertension)   Gastritis   COPD with acute exacerbation (HCC)   Malnutrition of moderate degree   CKD (chronic kidney disease), stage III (HCC)   Leucocytosis  Acute  COPD exacerbation: Has history of severe COPD.  Gold stage IV.  Patient is on as needed oxygen at home.  He  qualifies for continuous home oxygen. Continue scheduled bronchodilators.  Continue steroid for now.Will taper steroid. Added spiriva today. Patient was also completed  doxycycline for COPD exacerbation Continues to be dyspneic.  But saturating fine on supplemental oxygen. Patient smokes occasionally and was smoking before he presented to the emergency department for dyspnea.  Persistent dyspnea: Complains of persistent dyspnea.  His dyspnea is likely secondary to his metastatic lung cancer and COPD exacerbation.  Becomes short of breath on minimal exertion.  Chest x-ray did not show any pneumonia on presentation. CT chest negative for pulmonary  embolism. Pulmonology evaluated him today ABG shows hypoxia. Echocardiogram showed normal left ventricular ejection fraction , mildly elevated pulmonary arterys pressure .  Repeat chest x-ray did not show any pneumonia or significant pleural effusion. Patient has been encouraged to ambulate.  Physical therapy following. We will also request palliative care evaluation because of his  End stage  COPD ,stage IV lung cancer and persistent dyspnea.  Leukocytosis: Most likely secondary to steroids.  We will continue to monitor the trend.   Patient is afebrile.  His blood cultures are negative.  Metastatic non-small cell stage IV lung cancer: Follows with [name].  Currently on chemotherapy.  Next chemo on 03/14/17. Continue pain medications.  [name] notified about his current situation and he is aware about him.  Hypertension: Currently blood pressure stable.  Continue Norvasc, beta-blockers.  History of gastritis: Patient has history of chronic abdominal pain secondary to gastritis.  Continue PPI  CKD: His baseline creatinine ranges from 1.3-1.6.  Currently kidney function on baseline.    DVT prophylaxis: Heparin Code Status: Full Family Communication: None present at the bedside Disposition Plan: Home after evaluation by palliative care Consultants: None  Procedures:None  Antimicrobials:Doxycycline since 03/10/17-03/16/17  Subjective: Patient seen and examined the bedside this morning.  Remains dyspneic although  his respiratory status has slightly improved this morning.  Objective: Vitals:   03/18/17 0226 03/18/17 0657 03/18/17 0804 03/18/17 1319  BP:  (!) 139/92  (!) 143/73  Pulse:  78  76  Resp:  14    Temp:  98.6 F (37 C)  97.9 F (36.6 C)  TempSrc:  Oral  Oral  SpO2: 94% 95% 97% 94%  Weight:      Height:  Intake/Output Summary (Last 24 hours) at 03/18/2017 1453 Last data filed at 03/18/2017 1254 Gross per 24 hour  Intake 342 ml  Output 1850 ml  Net  -1508 ml   Filed Weights   03/09/17 1651 03/09/17 2356  Weight: 59 kg (130 lb) 57.3 kg (126 lb 4.8 oz)    Examination:  General exam: Not in distress, thin, chronically ill HEENT:PERRL,Oral mucosa moist, Ear/Nose normal on gross exam Respiratory system: Bilateral decreased air entry, no wheezes  cardiovascular system: S1 & S2 heard, RRR. No JVD, murmurs, rubs, gallops or clicks. Gastrointestinal system: Abdomen is nondistended, soft and nontender. No organomegaly or masses felt. Normal bowel sounds heard. Central nervous system: Alert and oriented. No focal neurological deficits. Extremities: No edema, no clubbing ,no cyanosis, distal peripheral pulses palpable. Skin: No rashes, lesions or ulcers,no icterus ,no pallor  Psychiatry: Judgement and insight appear normal. Mood & affect appropriate.       Data Reviewed: I have personally reviewed following labs and imaging studies  CBC: Recent Labs  Lab 03/14/17 0358 03/15/17 0443 03/16/17 0503 03/17/17 0445 03/18/17 0432  WBC 24.3* 23.6* 24.7* 23.4* 22.9*  NEUTROABS 22.4* 20.1* 23.0* 22.0* 21.5*  HGB 11.0* 10.9* 10.9* 10.7* 10.8*  HCT 35.1* 33.6* 34.0* 33.1* 33.6*  MCV 107.7* 106.7* 104.6* 103.4* 103.7*  PLT 276 252 229 204 629   Basic Metabolic Panel: Recent Labs  Lab 03/13/17 0857 03/14/17 0358 03/15/17 0443 03/16/17 0503  NA 139 140 140 141  K 4.7 5.0 4.8 4.7  CL 98* 102 100* 99*  CO2 28 28 29 29   GLUCOSE 168* 150* 139* 145*  BUN 39* 39* 40* 44*  CREATININE 1.50* 1.39* 1.62* 1.38*  CALCIUM 10.0 9.8 9.7 9.7  MG  --   --  2.0  --    GFR: Estimated Creatinine Clearance: 47.9 mL/min (A) (by C-G formula based on SCr of 1.38 mg/dL (H)). Liver Function Tests: No results for input(s): AST, ALT, ALKPHOS, BILITOT, PROT, ALBUMIN in the last 168 hours. No results for input(s): LIPASE, AMYLASE in the last 168 hours. No results for input(s): AMMONIA in the last 168 hours. Coagulation Profile: No results for input(s):  INR, PROTIME in the last 168 hours. Cardiac Enzymes: No results for input(s): CKTOTAL, CKMB, CKMBINDEX, TROPONINI in the last 168 hours. BNP (last 3 results) No results for input(s): PROBNP in the last 8760 hours. HbA1C: No results for input(s): HGBA1C in the last 72 hours. CBG: No results for input(s): GLUCAP in the last 168 hours. Lipid Profile: No results for input(s): CHOL, HDL, LDLCALC, TRIG, CHOLHDL, LDLDIRECT in the last 72 hours. Thyroid Function Tests: No results for input(s): TSH, T4TOTAL, FREET4, T3FREE, THYROIDAB in the last 72 hours. Anemia Panel: No results for input(s): VITAMINB12, FOLATE, FERRITIN, TIBC, IRON, RETICCTPCT in the last 72 hours. Sepsis Labs: No results for input(s): PROCALCITON, LATICACIDVEN in the last 168 hours.  Recent Results (from the past 240 hour(s))  Culture, blood (routine x 2)     Status: None (Preliminary result)   Collection Time: 03/14/17  7:53 AM  Result Value Ref Range Status   Specimen Description BLOOD RIGHT ARM  Final   Special Requests   Final    IN PEDIATRIC BOTTLE Blood Culture adequate volume Performed at [facility] [address] [place], [place] [address]    Culture   Final    NO GROWTH 4 DAYS Performed at [facility], [place] [address], [place], [place] [address]    Report  Status PENDING  Incomplete  Culture, blood (routine x 2)     Status: None (Preliminary result)   Collection Time: 03/14/17  7:53 AM  Result Value Ref Range Status   Specimen Description   Final    BLOOD LEFT HAND Performed at [facility] [address], [place], [place] [address]    Special Requests   Final    BOTTLES DRAWN AEROBIC ONLY Blood Culture adequate volume Performed at [facility] [address] [place], [place] [address]    Culture   Final    NO GROWTH 4 DAYS Performed at [facility], [place] [address] [place], [place] [address]    Report Status PENDING   Incomplete         Radiology Studies: Dg Chest Port 1 View  Result Date: 03/17/2017 CLINICAL DATA:  57 year old male with increasing shortness of breath. Chronic lung disease and history of lung cancer. EXAM: PORTABLE CHEST 1 VIEW COMPARISON:  Chest CTA 03/13/2017 and earlier. FINDINGS: Portable AP semi upright view at 1133 hrs. Stable lung volumes. Stable cardiac size and mediastinal contours. Stable right chest porta cath. Left lung bullous emphysema and architectural distortion is stable. No pneumothorax or pulmonary edema identified. Small right pleural effusion appears stable. Paucity of bowel gas in the upper abdomen. IMPRESSION: Stable radiographic appearance of the chest since 01/06/2018 with small right pleural effusion superimposed on Emphysema (ICD10-J43.9). No new acute cardiopulmonary abnormality. Electronically Signed   By: Genevie Ann M.D.   On: 03/17/2017 12:22        Scheduled Meds: . amLODipine  10 mg Oral Daily  . bisoprolol  5 mg Oral Daily  . feeding supplement (ENSURE ENLIVE)  237 mL Oral TID BM  . fluconazole  100 mg Oral Daily  . guaiFENesin  600 mg Oral BID  . heparin injection (subcutaneous)  5,000 Units Subcutaneous Q8H  . loratadine  10 mg Oral Daily  . magic mouthwash  5 mL Oral QID  . mometasone-formoterol  2 puff Inhalation BID  . pantoprazole  40 mg Oral BID  . [START ON 03/19/2017] predniSONE  40 mg Oral Q breakfast  . senna-docusate  1 tablet Oral QHS  . tiotropium  18 mcg Inhalation Daily   Continuous Infusions:    LOS: 9 days    Time spent:25 minutes. More than 50% of that time was spent in counseling and/or coordination of care.      Marene Lenz, MD [facility] Pager [phone]  If 7PM-7AM, please contact night-coverage [email or web address] Password [facility] 03/18/2017, 2:53 PM

## 2017-03-18 NOTE — Consult Note (Signed)
PULMONARY / CRITICAL CARE MEDICINE   Name: [name] MRN: [id number] DOB: 10-31-1960 PCP Patient, No Pcp Per LOS 9 as of 03/18/2017     ADMISSION DATE:  03/09/2017 CONSULTATION DATE:  03/18/2017   REFERRING MD:  [facility] hospitalist Dr Tawanna Solo  CHIEF COMPLAINT:  Class 4 dyspnea in setting of stage 4 copd and stage 4 NSCLCC  BRIEF 57 year old African-American male.  History is obtained from talking to him and his bedside nurse and review of the chart.  He seems to be a fairly decent historian.  As best as I can gather it appears that he has Gold stage IV COPD based on July 2018 spirometry FEV1 1.1 L / 33%.  But he says he uses oxygen only as needed.  Review of the chart shows he has had recurrent admissions in the last few months for respiratory issues.  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  This was diagnosed in February 2018.  His stage IV diagnosis based on T3, N3, and M1.  As best as I can gather last visit was with [name] in December 2018 when his chemotherapy Alimta was postponed because of raised liver function.  He had completed palliative radiotherapy to the T12 and L3 vertebrae in May 2018.  He had been on single agent Alimta since September 2018.  He has had 3 admissions since Thanksgiving 2018 and the current one being the 4th.  Thanksgiving 2018 admission was for hematemesis.  In early December 2018 admission for dyspnea and hypoxemia with diagnosis of pulmonary edema and right pleural effusion [he has had 2 thoracentesis 1 on January 03, 2017 and the second 1 February 14, 2017 both are suggestive of exudate based on LDH cytology was atypical cells suggestive of reactive mesothelial cells but nondiagnostic for malignancy].  Discharged on oxygen.  It is to be noted that serial CT chest showed the new onset of pleural effusion on the right side around early December 2018/thanks  giving 2018.  [Personally visualized the CT chest].  Then another admission end of January 2018 for diagnosis of healthcare acquired pneumonia.  After his discharge he tells me that his dyspnea has deteriorated.  Some months ago he rated his dyspnea is 4 out of 10.  A month ago his dyspnea was 7 out of 10.  At admission it was 9 out of 10.  And since admission despite all intervention it is hovering around 8 out of 10.  He feels that he needs oxygen for any exertion although the nurse tells me that hypoxemia on room air has not been documented.  He feels unimproved and reports class III-4 dyspnea.  But since admission cough is improved, wheezing is improved and sputum yellow color has improved.  Reports weight loss Echo normal - ef 55% nov 2018 wut elevated PASP    SUBJECTIVE/OVERNIGHT/INTERVAL HX 03/18/2017 -- no evidence of desaturation < 88% with walking but needing o2 for subjective releif of dyspnea. He admist this is an issue. Also c/p chronic pain x 1 year. Not on oipids at home but in it in hospital and is helping. Never been to rehab. OVerall feels the same in terms of dyspnea  VITAL SIGNS: BP (!) 139/92 (BP Location: Right Arm)   Pulse 78   Temp 98.6 F (37 C) (Oral)   Resp 14   Ht 5\' 11"  (1.803 m)   Wt 57.3 kg (126 lb 4.8 oz)  SpO2 97%   BMI 17.62 kg/m        INTAKE / OUTPUT: I/O last 3 completed shifts: In: 240 [P.O.:240] Out: 1625 [Urine:1625]     EXAM   General Appearance:    Looks well. Sitting in chair  Head:    Normocephalic, without obvious abnormality, atraumatic  Eyes:    PERRL - yes, conjunctiva/corneas - clear      Ears:    Normal external ear canals, both ears  Nose:   NG tube - no but has [place] o2  Throat:  ETT TUBE - no , OG tube - no  Neck:   Supple,  No enlargement/tenderness/nodules     Lungs:     Clear to auscultation bilaterally, No wheeze. Overall AE diminished  Chest wall:    No deformity  Heart:    S1 and S2 normal, no murmur, CVP - no.   Pressors - no  Abdomen:     Soft, no masses, no organomegaly  Genitalia:    Not done  Rectal:   not done  Extremities:   Extremities- intact     Skin:   Intact in exposed areas .      Neurologic:   . Moves all 4s - yes. CAM-ICU - neg . Orientation - x3+          LABS  PULMONARY Recent Labs  Lab 03/17/17 1036  PHART 7.436  PCO2ART 43.0  PO2ART 63.0*  HCO3 28.5*  O2SAT 90.5    CBC Recent Labs  Lab 03/16/17 0503 03/17/17 0445 03/18/17 0432  HGB 10.9* 10.7* 10.8*  HCT 34.0* 33.1* 33.6*  WBC 24.7* 23.4* 22.9*  PLT 229 204 188    COAGULATION No results for input(s): INR in the last 168 hours.  CARDIAC  No results for input(s): TROPONINI in the last 168 hours. No results for input(s): PROBNP in the last 168 hours.   CHEMISTRY Recent Labs  Lab 03/13/17 0857 03/14/17 0358 03/15/17 0443 03/16/17 0503  NA 139 140 140 141  K 4.7 5.0 4.8 4.7  CL 98* 102 100* 99*  CO2 28 28 29 29   GLUCOSE 412* 150* 139* 145*  BUN 39* 39* 40* 44*  CREATININE 1.50* 1.39* 1.62* 1.38*  CALCIUM 10.0 9.8 9.7 9.7  MG  --   --  2.0  --    Estimated Creatinine Clearance: 47.9 mL/min (A) (by C-G formula based on SCr of 1.38 mg/dL (H)).   LIVER No results for input(s): AST, ALT, ALKPHOS, BILITOT, PROT, ALBUMIN, INR in the last 168 hours.   INFECTIOUS No results for input(s): LATICACIDVEN, PROCALCITON in the last 168 hours.   ENDOCRINE CBG (last 3)  No results for input(s): GLUCAP in the last 72 hours.       IMAGING x48h  - image(s) personally visualized  -   highlighted in bold Dg Chest Port 1 View  Result Date: 03/17/2017 CLINICAL DATA:  57 year old male with increasing shortness of breath. Chronic lung disease and history of lung cancer. EXAM: PORTABLE CHEST 1 VIEW COMPARISON:  Chest CTA 03/13/2017 and earlier. FINDINGS: Portable AP semi upright view at 1133 hrs. Stable lung volumes. Stable cardiac size and mediastinal contours. Stable right chest porta cath. Left  lung bullous emphysema and architectural distortion is stable. No pneumothorax or pulmonary edema identified. Small right pleural effusion appears stable. Paucity of bowel gas in the upper abdomen. IMPRESSION: Stable radiographic appearance of the chest since 01/06/2018 with small right pleural effusion superimposed on Emphysema (ICD10-J43.9). No new  acute cardiopulmonary abnormality. Electronically Signed   By: Genevie Ann M.D.   On: 03/17/2017 12:22       ASSESSMENT and PLAN  Dyspnea and respiratory abnormality He has stage IV non-small cell lung cancer associated with gold stage IV COPD.  ECho nov 2018 also cor pulmolame. Also a new right pleural effusion that is small-moderate since late November 2018; transudate v bordeline exudat on tap in dec 2018 wuith atypocal cells.   On top of this he has had multiple admissions since Thanksgiving 2018.  He admits to some weight loss.    Therefore a combination of COPD, lung cancer,and physical deconditioning and elevated PASP can all easily explain his class III-4 dyspnea. The role of the pleural effusion in dyspnea is + but very minor. No hypercapnia on ABG .03/17/17  In addition, I think chronic pain is cotnributing to dyspnea  Plan No role for bipap qhs due to lack of hypercapnia Continue copd Rx: add spiriva to dulera. Change nebs to prn. No need for resting o2 . Mnitor need for o2 with exertion Monitor pleural effusion clinically with time  Refer inpatient rehab if he is a candidate for deconditioning Address pain - by [facility] MD; might consider po home opiids if cancer pain Goals of care recommended       FAMILY  - Updates: 03/18/2017 --> patient   - Inter-disciplinary family meet or Palliative Care meeting due by:  DAy 7. Current LOS is LOS 9 days  CODE STATUS    Code Status Orders  (From admission, onward)        Start     Ordered   03/09/17 2359  Full code  Continuous     03/09/17 2359    Code Status History    Date Active  Date Inactive Code Status Order ID Comments User Context   02/14/2017 09:02 02/16/2017 14:55 Full Code [id number]  Florencia Reasons, MD Inpatient   02/10/2017 00:31 02/14/2017 09:02 DNR [id number]  Norval Morton, MD ED   12/30/2016 23:25 01/07/2017 21:09 DNR [id number]  Elodia [name]., MD Inpatient   12/17/2016 09:34 12/24/2016 18:11 DNR [id number]  Thurnell Lose, MD Inpatient   12/16/2016 16:49 12/17/2016 09:34 Full Code [id number]  Charlynne Cousins, MD Inpatient        DISPO Floor  pccm will sign off     Dr. Brand Males, M.D., [facility].C.P Pulmonary and Critical Care Medicine Staff Physician, [place] Director - Interstitial Lung Disease  Program  Pulmonary [place] at [place], [place], [address]  Pager: [phone], If no answer or between  15:00h - 7:00h: call [phone] Telephone: [phone]

## 2017-03-18 NOTE — Progress Notes (Signed)
Patient ambulated in the room to the bathroom and called the RN after returning to bed in a panic because he was incredibly short of breath.  RN helped patient apply oxygen at 2 L/M [place] and encouraged slow deep breaths. Patient given pain medicine as well to ease work of breathing.  Will continue to monitor.

## 2017-03-19 LAB — CULTURE, BLOOD (ROUTINE X 2)
CULTURE: NO GROWTH
Culture: NO GROWTH
Special Requests: ADEQUATE
Special Requests: ADEQUATE

## 2017-03-19 MED ORDER — GUAIFENESIN ER 600 MG PO TB12: 600.0000 mg | ORAL_TABLET | Freq: Two times a day (BID) | ORAL | 0 refills | Status: DC

## 2017-03-19 MED ORDER — MOMETASONE FURO-FORMOTEROL FUM 100-5 MCG/ACT IN AERO: 2.0000 | INHALATION_SPRAY | Freq: Two times a day (BID) | RESPIRATORY_TRACT | 0 refills | Status: DC

## 2017-03-19 MED ORDER — ENSURE ENLIVE PO LIQD: 237.0000 mL | Freq: Three times a day (TID) | ORAL | 2 refills | Status: DC

## 2017-03-19 MED ORDER — OXYCODONE HCL 10 MG PO TABS: 10.0000 mg | ORAL_TABLET | ORAL | 0 refills | Status: DC | PRN

## 2017-03-19 MED ORDER — TIOTROPIUM BROMIDE MONOHYDRATE 18 MCG IN CAPS: 18.0000 ug | ORAL_CAPSULE | Freq: Every day | RESPIRATORY_TRACT | 1 refills | Status: DC

## 2017-03-19 MED ORDER — POLYETHYLENE GLYCOL 3350 17 G PO PACK: 17.0000 g | PACK | Freq: Every day | ORAL | 0 refills | Status: DC | PRN

## 2017-03-19 MED ORDER — FLUCONAZOLE 100 MG PO TABS: 100.0000 mg | ORAL_TABLET | Freq: Every day | ORAL | 0 refills | Status: DC

## 2017-03-19 MED ORDER — PREDNISONE 10 MG PO TABS: ORAL_TABLET | ORAL | 0 refills | Status: DC

## 2017-03-19 NOTE — Consult Note (Signed)
Consultation Note Date: 03/19/2017   Patient Name: [name]  DOB: Jan 04, 1961  MRN: [id number]  Age / Sex: 57 y.o., male  PCP: Patient, No Pcp Per Referring Physician: Marene Lenz, MD  Reason for Consultation: Establishing goals of care and symptom management  HPI/Patient Profile: 57 y.o. male  with past medical history of COPD and stage IV NSCLC, spinal mets, s/p radiation therapy, currently undergoing chemotherapy, seizure d/o, diastolic dysfunction admitted on 03/09/2017 with COPD exacerbation.  He has continued to have increased SOB with any ambulation.  Palliative consulted for symptom management and goals of care.   Clinical Assessment and Goals of Care: I met today with [name].   I introduced palliative care as specialized medical care for people living with serious illness. It focuses on providing relief from the symptoms and stress of a serious illness. The goal is to improve quality of life for both the patient and the family.  He reports that his main goal is to "feel better and live as long as I can."  Reports that he was told in the past that he had 6 months to live, but that was 2 years ago and "only God knows how long I've got."  He knows that he has incurable cancer, but he reports needing to talk to [name] before discussing any changes to his care plan as he is the only doctor he has ever seen with any consistency.   We discussed his SOB and role for opioid therapy in improving functional status in light of sever dyspnea related to his COPD and lung cancer.  He has been using morphine 67m intermittently with some success.  He also has home O2 that he will use intermittently, but he reports that it is not something that he wears all of the time.  Questions and concerns addressed.   PMT will continue to support holistically.  SUMMARY OF RECOMMENDATIONS   - Full code-  Recommended that he continue to discuss this with his family and care team.  We discussed benefits/burdens of heroic interventions in advanced cancer at the time of respiratory or cardiac arrest.  He reports needing to discuss with family and Dr. MJulien Nordmannprior to considering any changes. - Dyspnea- With his renal impairment, I thing that he may be best served by transitioning off of morphine.  Will plan for oxycodone 145mevery 4 hours as needed for pain or SOB.  If beneficial, would consider starting low dose long acting medication.  Plan for bedside fan and discussed energy conservation.    - Advance directives- He would like to name his mother and son as his surrogates.  Placed referral to spiritual care to facilitate.  Code Status/Advance Care Planning:  Full code   Symptom Management:   As above  Palliative Prophylaxis:   Bowel Regimen  Additional Recommendations (Limitations, Scope, Preferences):  Full Scope Treatment  Psycho-social/Spiritual:   Desire for further Chaplaincy support:yes  Additional Recommendations: Caregiving  Support/Resources  Prognosis:   Unable to determine  Discharge  Planning: To Be Determined      Primary Diagnoses: Present on Admission: . Adenocarcinoma of left lung, stage 4 ([place]) . Chronic obstructive pulmonary disease ([place]) . Gastritis . HTN (hypertension) . Tobacco abuse . COPD with acute exacerbation ([place])   I have reviewed the medical record, interviewed the patient and family, and examined the patient. The following aspects are pertinent.  Past Medical History:  Diagnosis Date  . Abdominal pain 06/04/2016  . Adenocarcinoma of left lung, stage 4 ([place]) 05/02/2016  . Bronchitis due to tobacco use ([place])   . Cancer ([place])    Lung  . Dehydration 06/04/2016  . Encounter for antineoplastic chemotherapy 05/02/2016  . Goals of care, counseling/discussion 05/02/2016  . HTN (hypertension) 10/30/2016  . Recurrent upper respiratory  infection ([facility])   . Seizures ([place]) 05/2011   new onset  . Shortness of breath    Social History   Socioeconomic History  . Marital status: Single    Spouse name: None  . Number of children: None  . Years of education: None  . Highest education level: None  Social Needs  . Financial resource strain: None  . Food insecurity - worry: None  . Food insecurity - inability: None  . Transportation needs - medical: None  . Transportation needs - non-medical: None  Occupational History  . None  Tobacco Use  . Smoking status: Current Some Day Smoker    Packs/day: 0.10    Years: 30.00    Pack years: 3.00    Types: Cigarettes  . Smokeless tobacco: Never Used  Substance and Sexual Activity  . Alcohol use: No    Frequency: Never  . Drug use: No  . Sexual activity: Yes  Other Topics Concern  . None  Social History Narrative  . None   Family History  Problem Relation Age of Onset  . Cancer Father   . Diabetes Mellitus II Mother    Scheduled Meds: . amLODipine  10 mg Oral Daily  . bisoprolol  5 mg Oral Daily  . feeding supplement (ENSURE ENLIVE)  237 mL Oral TID BM  . fluconazole  100 mg Oral Daily  . guaiFENesin  600 mg Oral BID  . heparin injection (subcutaneous)  5,000 Units Subcutaneous Q8H  . loratadine  10 mg Oral Daily  . magic mouthwash  5 mL Oral QID  . mometasone-formoterol  2 puff Inhalation BID  . pantoprazole  40 mg Oral BID  . predniSONE  40 mg Oral Q breakfast  . senna-docusate  1 tablet Oral QHS  . tiotropium  18 mcg Inhalation Daily   Continuous Infusions: PRN Meds:.acetaminophen **OR** acetaminophen, benzonatate, ipratropium-albuterol, LORazepam, ondansetron (ZOFRAN) IV, ondansetron, oxyCODONE, polyethylene glycol Medications Prior to Admission:  Prior to Admission medications   Medication Sig Start Date End Date Taking? Authorizing Provider  acetaminophen (TYLENOL) 500 MG tablet Take 500 mg by mouth 2 (two) times daily as needed for mild pain or  headache.   Yes [provider]  amLODipine (NORVASC) 10 MG tablet Take 10 mg by mouth daily.   Yes [provider]  bisoprolol (ZEBETA) 5 MG tablet Take 1 tablet (5 mg total) by mouth daily. 02/16/17  Yes Florencia Reasons, MD  Chlorphen-Phenyleph-ASA (ALKA-SELTZER PLUS COLD PO) Take 2 tablets by mouth at bedtime as needed (COUGH).   Yes [provider]  dexamethasone (DECADRON) 4 MG tablet Take 4 mg by mouth as directed. Take 1 tablet twice daily the day before, the day of and the day after  chemotherapy every 3 weeks.   Yes [provider]  furosemide (LASIX) 40 MG tablet Take 1 tablet (40 mg total) by mouth every three (3) days as needed for fluid or edema. 02/16/17 03/18/17 Yes Florencia Reasons, MD  guaiFENesin (MUCINEX) 600 MG 12 hr tablet Take 1 tablet (600 mg total) by mouth 2 (two) times daily. 02/16/17  Yes Florencia Reasons, MD  Ipratropium-Albuterol (COMBIVENT) 20-100 MCG/ACT AERS respimat Inhale 1 puff into the lungs every 6 (six) hours. 07/22/16  Yes Curt Bears, MD  lidocaine-prilocaine (EMLA) cream Apply 1 application topically as needed.   Yes [provider]  loratadine (CLARITIN) 10 MG tablet Take 10 mg by mouth daily.   Yes [provider]  mometasone-formoterol (DULERA) 100-5 MCG/ACT AERO Inhale 2 puffs into the lungs 2 (two) times daily. 12/24/16  Yes Eugenie Filler, MD  morphine (MSIR) 15 MG tablet Take 15 mg by mouth every 4 (four) hours as needed for severe pain.   Yes [provider]  ondansetron (ZOFRAN-ODT) 4 MG disintegrating tablet Take 4 mg by mouth every 8 (eight) hours as needed for nausea or vomiting.   Yes [provider]  pantoprazole (PROTONIX) 40 MG tablet Take 1 tablet (40 mg total) by mouth 2 (two) times daily. 12/18/16  Yes Eugenie Filler, MD  senna-docusate (SENNA-PLUS) 8.6-50 MG tablet Take 1 tablet by mouth at bedtime.   Yes [provider]  predniSONE (DELTASONE) 10 MG tablet Label  & dispense  according to the schedule below. 4 Pills PO on day one then, 3 Pills PO on day two, 2 Pills PO on day three, 1Pills PO on day four,  then STOP.  Total of 10 tabs Patient not taking: Reported on 03/09/2017 02/16/17   Florencia Reasons, MD   No Known Allergies Review of Systems  Constitutional: Positive for activity change and fatigue.  Respiratory: Positive for cough, shortness of breath and wheezing.   Neurological: Positive for weakness.  Psychiatric/Behavioral: Positive for sleep disturbance.    Physical Exam  General: Alert, awake, in no acute distress. Thin and sitting on side of bed.  Mild increase in WOB. HEENT: No bruits, no goiter, no JVD Heart: Regular rate and rhythm. No murmur appreciated. Lungs: Dinminished air movement Abdomen: Soft, nontender, nondistended, positive bowel sounds.  Ext: No significant edema Skin: Warm and dry Neuro: Grossly intact, nonfocal.   Vital Signs: BP 127/71 (BP Location: Right Arm)   Pulse 66   Temp 98.6 F (37 C) (Oral)   Resp 20   Ht 5' 11"  (1.803 m)   Wt 57.3 kg (126 lb 4.8 oz)   SpO2 90%   BMI 17.62 kg/m  Pain Assessment: 0-10 POSS *See Group Information*: 1-Acceptable,Awake and alert Pain Score: Asleep   SpO2: SpO2: 90 % O2 Device:SpO2: 90 % O2 Flow Rate: .O2 Flow Rate (L/min): 2 L/min  IO: Intake/output summary:   Intake/Output Summary (Last 24 hours) at 03/19/2017 0758 Last data filed at 03/18/2017 1836 Gross per 24 hour  Intake 564 ml  Output 1050 ml  Net -486 ml    LBM: Last BM Date: 03/17/17 Baseline Weight: Weight: 59 kg (130 lb) Most recent weight: Weight: 57.3 kg (126 lb 4.8 oz)     Palliative Assessment/Data:   Flowsheet Rows     Most Recent Value  Intake Tab  Referral Department  Hospitalist  Unit at Time of Referral  Oncology Unit  Palliative Care Primary Diagnosis  Cancer  Date Notified  03/17/17  Palliative Care  Type  New Palliative care  Reason for referral  Pain, Clarify Goals of Care  Date of Admission   03/09/17  Date first seen by Palliative Care  03/18/17  # of days Palliative referral response time  1 Day(s)  # of days IP prior to Palliative referral  8  Clinical Assessment  Palliative Performance Scale Score  60%  Pain Max last 24 hours  9  Pain Min Last 24 hours  3  Psychosocial & Spiritual Assessment  Palliative Care Outcomes  Patient/Family meeting held?  Yes  Who was at the meeting?  Patient  Palliative Care Outcomes  Improved pain interventions      Time In: 1440 Time Out: 1600 Time Total: 80 Greater than 50%  of this time was spent counseling and coordinating care related to the above assessment and plan.  Signed by: Micheline Rough, MD   Please contact Palliative Medicine Team phone at [phone] for questions and concerns.  For individual provider: See Shea Evans

## 2017-03-19 NOTE — Discharge Summary (Signed)
Physician Discharge Summary  [name] NLZ:[id number] DOB: 1960-10-15 DOA: 03/09/2017  PCP: Patient, No Pcp Per  Admit date: 03/09/2017 Discharge date: 03/19/2017  Admitted From: Home Disposition:  Home  Home Health:Yes Equipment/Devices:Oxygen  Discharge Condition: Stable CODE STATUS:Full  Diet recommendation: Heart Healthy    Brief/Interim Summary: 57 y.o.malewith medical history significant ofstage IV non-small cell lung cancer,  spinal metastases status post radiation therapy, undergoing current chemotherapy (next treatment was  on 03/14/2017), Gold stage IV COPD ,on oxygen intermittently on 2-3 L nasal cannula, remote history of seizure disorder, diastolic dysfunction by echo on 01/2017, hypertension, gastritis who comes in with worsening cough, shortness of breath, respiratory distress. Patient was admitted and was being managed for COPD exacerbation.  He has a prolonged hospital stay secondary to persistent dyspnea. Patient was also seen by pulmonology and palliative care during his hospitalization.His chest imagings did not show any pneumonia, pulmonary embolism or significant effusion.  Patient qualified for home oxygen on discharge. His dyspnea has improved after he was started on oxycodone by palliative care.  His COPD medications have been optimized.  He will follow-up with pulmonology as an outpatient.  Following problems were addressed during his hospitalization:  Acute  COPD exacerbation: Has history of severe COPD.  Gold stage IV.  Patient is on as needed oxygen at home.  He  qualifies for continuous home oxygen. Will taper steroid. Added spiriva.He will continue Dulera at home Patient was also completed  doxycycline for COPD exacerbation He has been saturating fine on supplemental oxygen. Patient smokes occasionally and was smoking before he presented to the emergency department for dyspnea.Counselled for smoking cessation.  He will follow-up with pulmonology on  discharge.  Persistent dyspnea: Complains of persistent dyspnea.  His dyspnea is likely secondary to his metastatic lung cancer and COPD exacerbation.  Becomes short of breath on minimal exertion.  Chest x-ray did not show any pneumonia on presentation. CT chest negative for pulmonary embolism. Echocardiogram showed normal left ventricular ejection fraction , mildly elevated pulmonary arterys pressure .  Repeat chest x-ray did not show any pneumonia or significant pleural effusion. Patient has been encouraged to ambulate.  Physical therapy was  following. We was also seen by  palliative care evaluation because of his  End stage  COPD ,stage IV lung cancer and persistent dyspnea. Dyspnea has improved today.  Leukocytosis: Most likely secondary to steroids.  We will continue to monitor the trend.   Patient is afebrile.  His blood cultures are negative. We recommend to check white cell counts in a week during his follow-up with pulmonology/oncology.  Metastatic non-small cell stage IV lung cancer: Follows with [name].  Currently on chemotherapy.  Next chemo on 03/14/17. Continue pain medications.  [name] notified about his current situation and he is aware about him.  Hypertension: Currently blood pressure stable.  Continue Norvasc, beta-blockers.  History of gastritis: Patient has history of chronic abdominal pain secondary to gastritis.  Continue PPI  CKD: His baseline creatinine ranges from 1.3-1.6.  Currently kidney function on baseline    Discharge Diagnoses:  Principal Problem:   Dyspnea and respiratory abnormality Active Problems:   Seizure (HCC)   Tobacco abuse   Adenocarcinoma of left lung, stage 4 (HCC)   Chronic obstructive pulmonary disease (HCC)   HTN (hypertension)   Gastritis   COPD with acute exacerbation (HCC)   Malnutrition of moderate degree   CKD (chronic kidney disease), stage III ([place])   Leucocytosis    Discharge Instructions  Discharge  Instructions    Diet - low sodium heart healthy   Complete by:  As directed    Discharge instructions   Complete by:  As directed    1) Take prescribed medications as instructed. 2) Follow up with pulmonology as an outpatient.  Name and number of the provider has been attached. 3) Follow up with your oncologist as soon as possible. 4) Please check CBC in a week.   Increase activity slowly   Complete by:  As directed      Allergies as of 03/19/2017   No Known Allergies     Medication List    STOP taking these medications   morphine 15 MG tablet Commonly known as:  MSIR     TAKE these medications   acetaminophen 500 MG tablet Commonly known as:  TYLENOL Take 500 mg by mouth 2 (two) times daily as needed for mild pain or headache.   ALKA-SELTZER PLUS COLD PO Take 2 tablets by mouth at bedtime as needed (COUGH).   amLODipine 10 MG tablet Commonly known as:  NORVASC Take 10 mg by mouth daily.   bisoprolol 5 MG tablet Commonly known as:  ZEBETA Take 1 tablet (5 mg total) by mouth daily.   dexamethasone 4 MG tablet Commonly known as:  DECADRON Take 4 mg by mouth as directed. Take 1 tablet twice daily the day before, the day of and the day after chemotherapy every 3 weeks.   feeding supplement (ENSURE ENLIVE) Liqd Take 237 mLs by mouth 3 (three) times daily between meals.   fluconazole 100 MG tablet Commonly known as:  DIFLUCAN Take 1 tablet (100 mg total) by mouth daily. Start taking on:  03/20/2017   furosemide 40 MG tablet Commonly known as:  LASIX Take 1 tablet (40 mg total) by mouth every three (3) days as needed for fluid or edema.   guaiFENesin 600 MG 12 hr tablet Commonly known as:  MUCINEX Take 1 tablet (600 mg total) by mouth 2 (two) times daily.   Ipratropium-Albuterol 20-100 MCG/ACT Aers respimat Commonly known as:  COMBIVENT Inhale 1 puff into the lungs every 6 (six) hours.   lidocaine-prilocaine cream Commonly known as:  EMLA Apply 1 application  topically as needed.   loratadine 10 MG tablet Commonly known as:  CLARITIN Take 10 mg by mouth daily.   mometasone-formoterol 100-5 MCG/ACT Aero Commonly known as:  DULERA Inhale 2 puffs into the lungs 2 (two) times daily.   ondansetron 4 MG disintegrating tablet Commonly known as:  ZOFRAN-ODT Take 4 mg by mouth every 8 (eight) hours as needed for nausea or vomiting.   Oxycodone HCl 10 MG Tabs Take 1 tablet (10 mg total) by mouth every 4 (four) hours as needed for severe pain (or shortness of breath).   pantoprazole 40 MG tablet Commonly known as:  PROTONIX Take 1 tablet (40 mg total) by mouth 2 (two) times daily.   polyethylene glycol packet Commonly known as:  MIRALAX / GLYCOLAX Take 17 g by mouth daily as needed for mild constipation.   predniSONE 10 MG tablet Commonly known as:  DELTASONE 4 pills daily for 2 days then 2 pill daily for 3 days then single  pill daily for 3 days then stop What changed:  additional instructions   SENNA-PLUS 8.6-50 MG tablet Generic drug:  senna-docusate Take 1 tablet by mouth at bedtime.   tiotropium 18 MCG inhalation capsule Commonly known as:  SPIRIVA Place 1 capsule (18 mcg total) into inhaler and  inhale daily. Start taking on:  03/20/2017            Durable Medical Equipment  (From admission, onward)        Start     Ordered   03/19/17 1334  For home use only DME oxygen  Once    Question Answer Comment  Mode or (Route) Nasal cannula   Liters per Minute 3   Frequency Continuous (stationary and portable oxygen unit needed)   Oxygen delivery system Gas      03/19/17 1333   03/14/17 1459  For home use only DME 4 wheeled rolling walker with seat  Once    Question:  Patient needs a walker to treat with the following condition  Answer:  Fear for personal safety   03/14/17 1502     Follow-up Information    Parrett, Fonnie Mu, NP Follow up on 04/01/2017.   Specialty:  Pulmonary Disease Why:  at 11am Contact information: [address] [place] [address] [phone]        Tanda Rockers, MD Follow up on 04/16/2017.   Specialty:  Pulmonary Disease Why:  315pm  Contact information: [address] [place] [address] [phone]        Curt Bears, MD. Schedule an appointment as soon as possible for a visit in 1 day(s).   Specialty:  Oncology Contact information: [place] [address] [phone]          No Known Allergies  Consultations:  Pulmonology, palliative care   Procedures/Studies: Dg Chest 2 View  Result Date: 03/01/2017 CLINICAL DATA:  Shortness of breath, cough, history adenocarcinoma of the LEFT lung, hypertension EXAM: CHEST  2 VIEW COMPARISON:  02/14/2017 FINDINGS: RIGHT jugular Port-A-Cath with tip projecting over cavoatrial junction. Normal heart size, mediastinal contours, and pulmonary vascularity. Emphysematous and minimal bronchitic changes consistent with COPD. Persistent focal opacity at LEFT base question residual infiltrate versus atelectasis. Remaining lungs grossly clear. No pleural effusion or pneumothorax. Bones unremarkable. IMPRESSION: COPD changes with minimal residual atelectasis or infiltrate at LEFT base. Electronically Signed   By: Lavonia Dana M.D.   On: 03/01/2017 20:58   Ct Angio Chest Pe W Or Wo Contrast  Result Date: 03/13/2017 CLINICAL DATA:  Increasing cough, shortness of breath and respiratory distress in a patient with a history of COPD and metastatic non-small cell lung carcinoma. EXAM: CT ANGIOGRAPHY CHEST WITH CONTRAST TECHNIQUE: Multidetector CT imaging of the chest was performed using the standard protocol during bolus administration of intravenous contrast. Multiplanar CT image reconstructions and MIPs were obtained to evaluate the vascular anatomy. CONTRAST:  100 ml ISOVUE-370 IOPAMIDOL (ISOVUE-370) INJECTION 76% COMPARISON:  CT chest 03/01/2017 and 12/31/2017. Single-view of the chest 03/09/2017.  FINDINGS: Cardiovascular: No pulmonary embolus is identified. Heart size is normal. No aortic aneurysm. No pericardial effusion. Mediastinum/Nodes: Thyroid gland appears normal. No lymphadenopathy. Mild thickening of the walls of the is esophagus and changed. Lungs/Pleura: Small to moderate right pleural effusion is unchanged since the most recent study. No left pleural effusion. Lungs demonstrate severe emphysematous disease. Spiculated left upper lobe nodule measures 2.9 x 1.5 cm on axial image 43 of series 6, unchanged since the most recent exam. Upper Abdomen: As seen on prior examinations but more conspicuous today is fluid adjacent to the celiac axis and posterior to the left hepatic lobe measuring approximately 3.9 cm AP x 3.7 cm transverse. Fluid measures at least 5 cm craniocaudal but is incompletely imaged on this study.  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 Musculoskeletal: Skeletal metastases appear unchanged. No acute bony abnormality. Review of the MIP images confirms the above findings. IMPRESSION: Negative for pulmonary embolus or acute cardiopulmonary disease. No change in a small to moderate right pleural effusion. No change in a spiculated left upper lobe density compatible with the patient's history of lung carcinoma. Fluid along the celiac axis in the upper abdomen is incompletely imaged. It has been present on prior exams but has increased in size. It may represent loculated ascites in this patient with a cirrhotic liver. If indicated, CT abdomen and pelvis with contrast could be used for further evaluation. Severe emphysema (ICD10-J43.9). Electronically Signed   By: Inge Rise M.D.   On: 03/13/2017 16:16   Ct Angio Chest Pe W And/or Wo Contrast  Result Date: 03/02/2017 CLINICAL DATA:  Subacute onset of shortness of breath and productive cough. Leukocytosis. Current history of non-small cell lung cancer on chemotherapy. EXAM: CT ANGIOGRAPHY CHEST WITH CONTRAST TECHNIQUE: Multidetector  CT imaging of the chest was performed using the standard protocol during bolus administration of intravenous contrast. Multiplanar CT image reconstructions and MIPs were obtained to evaluate the vascular anatomy. CONTRAST:  187mL ISOVUE-370 IOPAMIDOL (ISOVUE-370) INJECTION 76% COMPARISON:  Chest radiograph performed earlier today at 8:49 p.m., and CT of the chest performed 02/14/2017 FINDINGS: Cardiovascular:  There is no evidence of pulmonary embolus. The heart is normal in size. The thoracic aorta is grossly unremarkable. The great vessels are within normal limits. Mediastinum/Nodes: The mediastinum is grossly unremarkable in appearance. Diffuse esophageal wall thickening may reflect esophagitis or chronic inflammation. No pericardial effusion is identified. The thyroid gland is unremarkable. No axillary lymphadenopathy is seen. A right-sided chest port is noted ending about the distal SVC. Lungs/Pleura: A small right pleural effusion is noted, similar in appearance to the prior study. Scarring is again noted at the left lung base. Soft tissue density extending about the left upper lobe is relatively stable in appearance. No pneumothorax is seen. No new masses are identified. Upper Abdomen: The nodular contour of the liver raises concern for hepatic cirrhosis. The unusual hypoattenuated appearance of the central liver is grossly unchanged. A focal fluid collection superior to the proximal body of the pancreas may reflect a pseudocyst. The visualized portions of the liver are unremarkable. Musculoskeletal: No acute osseous abnormalities are identified. Sclerosis is again noted at vertebral body T12. The visualized musculature is unremarkable in appearance. Review of the MIP images confirms the above findings. IMPRESSION: 1. No evidence of pulmonary embolus. 2. Small right pleural effusion is similar in appearance to the prior study. 3. Relatively stable appearance to soft tissue density extending about the left  upper lung lobe, reflecting previously treated malignancy. 4. Diffuse esophageal wall thickening may reflect esophagitis or chronic inflammation. 5. Findings of hepatic cirrhosis. Unusual hypoattenuated appearance of the central liver is stable in appearance. 6. Focal fluid collection superior to the proximal body of the pancreas may reflect a pseudocyst, similar in appearance to the prior study. Would correlate with pancreatic lab values. Electronically Signed   By: Garald Balding M.D.   On: 03/02/2017 00:36   Dg Chest Port 1 View  Result Date: 03/17/2017 CLINICAL DATA:  57 year old male with increasing shortness of breath. Chronic lung disease and history of lung cancer. EXAM: PORTABLE CHEST 1 VIEW COMPARISON:  Chest CTA 03/13/2017 and earlier. FINDINGS: Portable AP semi upright view at 1133 hrs. Stable lung volumes. Stable cardiac size and mediastinal contours. Stable right chest porta cath. Left  lung bullous emphysema and architectural distortion is stable. No pneumothorax or pulmonary edema identified. Small right pleural effusion appears stable. Paucity of bowel gas in the upper abdomen. IMPRESSION: Stable radiographic appearance of the chest since 01/06/2018 with small right pleural effusion superimposed on Emphysema (ICD10-J43.9). No new acute cardiopulmonary abnormality. Electronically Signed   By: Genevie Ann M.D.   On: 03/17/2017 12:22   Dg Chest Port 1 View  Result Date: 03/09/2017 CLINICAL DATA:  Severe dyspnea. EXAM: PORTABLE CHEST 1 VIEW COMPARISON:  March 01, 2017 FINDINGS: No pneumothorax. Stable right Port-A-Cath. The cardiomediastinal silhouette is unremarkable. Hyperinflation of the lungs. Scarring in the lateral left lung base. No nodules or masses. Emphysematous changes in the lungs. IMPRESSION: Emphysematous changes.  No acute abnormalities. Electronically Signed   By: Dorise Bullion III M.D   On: 03/09/2017 17:46   Echo   Subjective:  Patient seen and examined the bedside this  morning.  Looks better today.  Her respiratory status is currently stable.  He said dyspnea has improved .  Discussed about the discharge planning. Discharge Exam: Vitals:   03/19/17 0550 03/19/17 1005  BP: 127/71   Pulse: 66 70  Resp: 20 17  Temp: 98.6 F (37 C)   SpO2: 90% 98%   Vitals:   03/18/17 2108 03/18/17 2157 03/19/17 0550 03/19/17 1005  BP: 135/77  127/71   Pulse: 72  66 70  Resp: 20  20 17   Temp: (!) 97.4 F (36.3 C)  98.6 F (37 C)   TempSrc: Oral  Oral   SpO2: 97% 95% 90% 98%  Weight:      Height:        General: Pt is alert, awake, not in acute distress Cardiovascular: RRR, S1/S2 +, no rubs, no gallops Respiratory: Bilateral decreased air entry Abdominal: Soft, NT, ND, bowel sounds + Extremities: no edema, no cyanosis    The results of significant diagnostics from this hospitalization (including imaging, microbiology, ancillary and laboratory) are listed below for reference.     Microbiology: Recent Results (from the past 240 hour(s))  Culture, blood (routine x 2)     Status: None   Collection Time: 03/14/17  7:53 AM  Result Value Ref Range Status   Specimen Description BLOOD RIGHT ARM  Final   Special Requests   Final    IN PEDIATRIC BOTTLE Blood Culture adequate volume Performed at [facility] [address], [place], [place] [address]    Culture   Final    NO GROWTH 5 DAYS Performed at [facility], [place] [address], [place], [place] [address]    Report Status 03/19/2017 FINAL  Final  Culture, blood (routine x 2)     Status: None   Collection Time: 03/14/17  7:53 AM  Result Value Ref Range Status   Specimen Description   Final    BLOOD LEFT HAND Performed at [facility] [address] [place], [place] [address]    Special Requests   Final    BOTTLES DRAWN AEROBIC ONLY Blood Culture adequate volume Performed at [facility] [address], [place], [place] [address]     Culture   Final    NO GROWTH 5 DAYS Performed at [facility], [place] [address] [place], [place] [address]    Report Status 03/19/2017 FINAL  Final     Labs: BNP (last 3 results) Recent Labs    02/09/17 2014 03/01/17 2158 03/09/17 1719  BNP 40.7  14.8 50.2   Basic Metabolic Panel: Recent Labs  Lab 03/13/17 0857 03/14/17 0358 03/15/17 0443 03/16/17 0503  NA 139 140 140 141  K 4.7 5.0 4.8 4.7  CL 98* 102 100* 99*  CO2 28 28 29 29   GLUCOSE 168* 150* 139* 145*  BUN 39* 39* 40* 44*  CREATININE 1.50* 1.39* 1.62* 1.38*  CALCIUM 10.0 9.8 9.7 9.7  MG  --   --  2.0  --    Liver Function Tests: No results for input(s): AST, ALT, ALKPHOS, BILITOT, PROT, ALBUMIN in the last 168 hours. No results for input(s): LIPASE, AMYLASE in the last 168 hours. No results for input(s): AMMONIA in the last 168 hours. CBC: Recent Labs  Lab 03/14/17 0358 03/15/17 0443 03/16/17 0503 03/17/17 0445 03/18/17 0432  WBC 24.3* 23.6* 24.7* 23.4* 22.9*  NEUTROABS 22.4* 20.1* 23.0* 22.0* 21.5*  HGB 11.0* 10.9* 10.9* 10.7* 10.8*  HCT 35.1* 33.6* 34.0* 33.1* 33.6*  MCV 107.7* 106.7* 104.6* 103.4* 103.7*  PLT 276 252 229 204 188   Cardiac Enzymes: No results for input(s): CKTOTAL, CKMB, CKMBINDEX, TROPONINI in the last 168 hours. BNP: Invalid input(s): POCBNP CBG: No results for input(s): GLUCAP in the last 168 hours. D-Dimer No results for input(s): DDIMER in the last 72 hours. Hgb A1c No results for input(s): HGBA1C in the last 72 hours. Lipid Profile No results for input(s): CHOL, HDL, LDLCALC, TRIG, CHOLHDL, LDLDIRECT in the last 72 hours. Thyroid function studies No results for input(s): TSH, T4TOTAL, T3FREE, THYROIDAB in the last 72 hours.  Invalid input(s): FREET3 Anemia work up No results for input(s): VITAMINB12, FOLATE, FERRITIN, TIBC, IRON, RETICCTPCT in the last 72 hours. Urinalysis    Component Value Date/Time   COLORURINE YELLOW 12/30/2016 2049   APPEARANCEUR CLEAR  12/30/2016 2049   LABSPEC 1.005 12/30/2016 2049   PHURINE 7.0 12/30/2016 2049   GLUCOSEU NEGATIVE 12/30/2016 2049   HGBUR NEGATIVE 12/30/2016 2049   [place] 12/30/2016 2049   KETONESUR NEGATIVE 12/30/2016 2049   PROTEINUR NEGATIVE 12/30/2016 2049   UROBILINOGEN 0.2 05/29/2011 0122   NITRITE NEGATIVE 12/30/2016 2049   LEUKOCYTESUR NEGATIVE 12/30/2016 2049   Sepsis Labs Invalid input(s): PROCALCITONIN,  WBC,  LACTICIDVEN Microbiology Recent Results (from the past 240 hour(s))  Culture, blood (routine x 2)     Status: None   Collection Time: 03/14/17  7:53 AM  Result Value Ref Range Status   Specimen Description BLOOD RIGHT ARM  Final   Special Requests   Final    IN PEDIATRIC BOTTLE Blood Culture adequate volume Performed at [facility], [place] [address], [place], [place] [address]    Culture   Final    NO GROWTH 5 DAYS Performed at [facility], [place] [address], [place], [place] [address]    Report Status 03/19/2017 FINAL  Final  Culture, blood (routine x 2)     Status: None   Collection Time: 03/14/17  7:53 AM  Result Value Ref Range Status   Specimen Description   Final    BLOOD LEFT HAND Performed at [facility] [address] [place], [place] [address]    Special Requests   Final    BOTTLES DRAWN AEROBIC ONLY Blood Culture adequate volume Performed at [facility] [address] [place], [place] [address]    Culture   Final    NO GROWTH 5 DAYS Performed at [facility], [place] [address] [place], [place] [address]  Report Status 03/19/2017 FINAL  Final     Time coordinating discharge: Over 30 minutes  SIGNED:   Marene Lenz, MD  [facility] 03/19/2017, 1:46 PM Pager [phone]  If 7PM-7AM, please contact night-coverage [email or web address] Password [email or web address]

## 2017-03-19 NOTE — Progress Notes (Signed)
Patient returning to [name]. Facility aware of patient's discharge and confirmed patient's ability to return. Staff member Tory requested palliative follow up services be set up for patient at ALF through [facility]. CSW notified patient's RNCM. [facility] contacted, patient's declined for CSW to contact family. Patient reported that he will notify his mother and sister.  Patient's RN can call report to [phone], packet complete. CSW signing off, no other needs identified at this time.   Abundio Miu, [place] Social Worker [facility] Cell#: [phone]

## 2017-03-19 NOTE — Progress Notes (Signed)
Report attempted to be called x 3 to [facility] prior to patient's D/C. VM left with my callback number also. Patient left via PT[facility], VSS. O2 Sats stable on 2L/[place], no signs of respiratory distress or discomfort. Discharge paperwork printed and explained to patient who verbalized understanding. Printed prescriptions also given to patient.

## 2017-03-19 NOTE — Progress Notes (Addendum)
[facility] could not take pt. A call was made to [facility] for referral. Pt going to [name] [place] [phone].

## 2017-03-19 NOTE — NC FL2 (Signed)
[place] LEVEL OF CARE SCREENING TOOL     IDENTIFICATION  Patient Name: [name] Birthdate: 1960-02-01 Sex: male Admission Date (Current Location): 03/09/2017  [facility] [place] Number:  Herbalist and Address:  [facility],  [place] [address], [place]      Provider Number: [id number]  Attending Physician Name and Address:  Marene Lenz, MD  Relative Name and Phone Number:       Current Level of Care: Hospital Recommended Level of Care: [place] Prior Approval Number:    Date Approved/Denied:   PASRR Number:    Discharge Plan: Other (Comment)(Assisted Living Facility)    Current Diagnoses: Patient Active Problem List   Diagnosis Date Noted  . Leucocytosis 03/16/2017  . CKD (chronic kidney disease), stage III ([place]) 03/12/2017  . Malnutrition of moderate degree 03/10/2017  . COPD with acute exacerbation ([place]) 03/09/2017  . Anemia 02/07/2017  . Severe malnutrition ([place]) 01/03/2017  . DOE (dyspnea on exertion) 12/30/2016  . Nausea   . Hypervolemia   . HCAP (healthcare-associated pneumonia) 12/19/2016  . Dyspnea and respiratory abnormality 12/19/2016  . Erosive gastropathy 12/18/2016  . Gastritis 12/18/2016  . Hematemesis 12/16/2016  . Intractable nausea and vomiting 12/16/2016  . HTN (hypertension) 10/30/2016  . Chronic obstructive pulmonary disease ([place]) 09/02/2016  . COPD (chronic obstructive pulmonary disease) ([place]) 08/19/2016  . Dehydration 06/04/2016  . Abdominal pain 06/04/2016  . 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 Adenocarcinoma of left lung, stage 4 ([place]) 05/02/2016  . Goals of care, counseling/discussion 05/02/2016  . Encounter for antineoplastic chemotherapy 05/02/2016  . Tobacco abuse 05/30/2011  . Alcohol abuse 05/30/2011  . Seizure ([place]) 05/28/2011    Orientation RESPIRATION BLADDER Height & Weight     Self, Time, Situation, Place  O2 Continent Weight: 126 lb 4.8 oz  (57.3 kg) Height:  5\' 11"  (180.3 cm)  BEHAVIORAL SYMPTOMS/MOOD NEUROLOGICAL BOWEL NUTRITION STATUS      Continent Diet(regular)  AMBULATORY STATUS COMMUNICATION OF NEEDS Skin   Supervision Verbally Normal                       Personal Care Assistance Level of Assistance  Bathing, Feeding, Dressing Bathing Assistance: Limited assistance Feeding assistance: Independent Dressing Assistance: Limited assistance     Functional Limitations Info  Sight, Hearing, Speech Sight Info: Adequate Hearing Info: Adequate Speech Info: Adequate    SPECIAL CARE FACTORS FREQUENCY                       Contractures Contractures Info: Not present    Additional Factors Info  Code Status, Allergies Code Status Info: Full Code Allergies Info: NKA              Discharge Medications:  Medication List             STOP taking these medications            morphine 15 MG tablet Commonly known as:  MSIR                       TAKE these medications            acetaminophen 500 MG tablet Commonly known as:  TYLENOL Take 500 mg by mouth 2 (two) times daily as needed for mild pain or headache.    ALKA-SELTZER PLUS COLD PO Take 2 tablets by mouth at bedtime as needed (COUGH).  amLODipine 10 MG tablet Commonly known as:  NORVASC Take 10 mg by mouth daily.    bisoprolol 5 MG tablet Commonly known as:  ZEBETA Take 1 tablet (5 mg total) by mouth daily.    dexamethasone 4 MG tablet Commonly known as:  DECADRON Take 4 mg by mouth as directed. Take 1 tablet twice daily the day before, the day of and the day after chemotherapy every 3 weeks.    feeding supplement (ENSURE ENLIVE) Liqd Take 237 mLs by mouth 3 (three) times daily between meals.    fluconazole 100 MG tablet Commonly known as:  DIFLUCAN Take 1 tablet (100 mg total) by mouth daily. Start taking on:  03/20/2017    furosemide 40 MG tablet Commonly known as:  LASIX Take 1 tablet (40 mg total) by  mouth every three (3) days as needed for fluid or edema.    guaiFENesin 600 MG 12 hr tablet Commonly known as:  MUCINEX Take 1 tablet (600 mg total) by mouth 2 (two) times daily.    Ipratropium-Albuterol 20-100 MCG/ACT Aers respimat Commonly known as:  COMBIVENT Inhale 1 puff into the lungs every 6 (six) hours.    lidocaine-prilocaine cream Commonly known as:  EMLA Apply 1 application topically as needed.    loratadine 10 MG tablet Commonly known as:  CLARITIN Take 10 mg by mouth daily.    mometasone-formoterol 100-5 MCG/ACT Aero Commonly known as:  DULERA Inhale 2 puffs into the lungs 2 (two) times daily.    ondansetron 4 MG disintegrating tablet Commonly known as:  ZOFRAN-ODT Take 4 mg by mouth every 8 (eight) hours as needed for nausea or vomiting.    Oxycodone HCl 10 MG Tabs Take 1 tablet (10 mg total) by mouth every 4 (four) hours as needed for severe pain (or shortness of breath).    pantoprazole 40 MG tablet Commonly known as:  PROTONIX Take 1 tablet (40 mg total) by mouth 2 (two) times daily.    polyethylene glycol packet Commonly known as:  MIRALAX / GLYCOLAX Take 17 g by mouth daily as needed for mild constipation.    predniSONE 10 MG tablet Commonly known as:  DELTASONE 4 pills daily for 2 days then 2 pill daily for 3 days then single  pill daily for 3 days then stop What changed:  additional instructions    SENNA-PLUS 8.6-50 MG tablet Generic drug:  senna-docusate Take 1 tablet by mouth at bedtime.    tiotropium 18 MCG inhalation capsule Commonly known as:  SPIRIVA Place 1 capsule (18 mcg total) into inhaler and inhale daily. Start taking on:  03/20/2017          Relevant Imaging Results:  Relevant Lab Results:   Additional Information SSN  [id number]  [name], LCSW

## 2017-03-19 NOTE — Progress Notes (Signed)
Physical Therapy Treatment Patient Details Name: [name] MRN: [id number] DOB: 1960/03/05 Today's Date: 03/19/2017    History of Present Illness 57 year old male with history of stage IV non-small cell lung cancer, spinal metastases status post radiation therapy, undergoing current chemotherapy, Gold stage IV COPD on oxygen intermittently 2-3 L/m nasal cannula, remote history of seizure disorder, diastolic dysfunction by echo on 01/2017, hypertension, gastritis presented with worsening cough with shortness of breath and respiratory distress. Patient was admitted for COPD exacerbation.    PT Comments    Pt in recliner on RA sats at rest 94%.  Assisted with amb the full unit.  Tolerated well.  See mobility details below.   Follow Up Recommendations  No PT follow up     Equipment Recommendations       Recommendations for Other Services       Precautions / Restrictions Precautions Precautions: Fall Precaution Comments: monitor O2    Mobility  Bed Mobility               General bed mobility comments: OOB in recliner   Transfers Overall transfer level: Modified independent               General transfer comment: good safety cognition and use of hands to steady self  Ambulation/Gait Ambulation/Gait assistance: Supervision;Modified independent (Device/Increase time) Ambulation Distance (Feet): 500 Feet Assistive device: None;1 person hand held assist Gait Pattern/deviations: Step-to pattern;Step-through pattern     General Gait Details: tolerated distance on RA avg 90% but needed several standing rest breaks due to mild dyspnea and congested cough.  "I just can't seem to get this up".     Stairs            Wheelchair Mobility    Modified Rankin (Stroke Patients Only)       Balance                                            Cognition Arousal/Alertness: Awake/alert Behavior During Therapy: WFL for tasks  assessed/performed Overall Cognitive Status: Within Functional Limits for tasks assessed                                 General Comments: very pleasant      Exercises      General Comments        Pertinent Vitals/Pain Pain Assessment: No/denies pain    Home Living                      Prior Function            PT Goals (current goals can now be found in the care plan section) Progress towards PT goals: Progressing toward goals    Frequency    Min 3X/week      PT Plan Current plan remains appropriate    Co-evaluation              AM-PAC PT "6 Clicks" Daily Activity  Outcome Measure  Difficulty turning over in bed (including adjusting bedclothes, sheets and blankets)?: None Difficulty moving from lying on back to sitting on the side of the bed? : A Little Difficulty sitting down on and standing up from a chair with arms (e.g., wheelchair, bedside commode, etc,.)?: A Little Help needed moving to and from a  bed to chair (including a wheelchair)?: A Little Help needed walking in hospital room?: A Little Help needed climbing 3-5 steps with a railing? : A Little 6 Click Score: 19    End of Session Equipment Utilized During Treatment: Gait belt Activity Tolerance: Patient tolerated treatment well Patient left: in chair;with call bell/phone within reach Nurse Communication: Mobility status PT Visit Diagnosis: Other abnormalities of gait and mobility (R26.89)     Time: 1610-9604 PT Time Calculation (min) (ACUTE ONLY): 14 min  Charges:  $Gait Training: 8-22 mins                    G Codes:       Rica Koyanagi  PTA [facility]  Acute  Rehab Pager      [phone]

## 2017-03-19 NOTE — Progress Notes (Signed)
   03/19/17 1456  Clinical Encounter Type  Visited With Patient  Visit Type Initial  Referral From Nurse  Consult/Referral To Chaplain  Spiritual Encounters  Spiritual Needs Literature   Responded to a SCC for information on HCPA.  Patient was alone sitting up in the chair.  He wanted the information.  Reviewed the form and he wanted to complete now.  We facilitated the completion naming his mother and son as healthcare agents.  Will follow and support as needed. Chaplain Katherene Ponto

## 2017-03-19 NOTE — Progress Notes (Signed)
Daily Progress Note   Patient Name: [name]       Date: 03/19/2017 DOB: [birth year]  Age: 57 y.o. MRN#: [id number] Attending Physician: Marene Lenz, MD Primary Care Physician: Patient, No Pcp Per Admit Date: 03/09/2017  Reason for Consultation/Follow-up: Establishing goals of care, Non pain symptom management and Pain control  Subjective: I met with [name] today.    He reports medication "somewhat" helpful for dyspnea.  He is both excited and anxious about getting out of the hospital.  Discussed recommendation to continue to use oxycodone prn for pain or SOB.  Length of Stay: 10  Current Medications: Scheduled Meds:  . amLODipine  10 mg Oral Daily  . bisoprolol  5 mg Oral Daily  . feeding supplement (ENSURE ENLIVE)  237 mL Oral TID BM  . fluconazole  100 mg Oral Daily  . guaiFENesin  600 mg Oral BID  . heparin injection (subcutaneous)  5,000 Units Subcutaneous Q8H  . loratadine  10 mg Oral Daily  . magic mouthwash  5 mL Oral QID  . mometasone-formoterol  2 puff Inhalation BID  . pantoprazole  40 mg Oral BID  . predniSONE  40 mg Oral Q breakfast  . senna-docusate  1 tablet Oral QHS  . tiotropium  18 mcg Inhalation Daily    Continuous Infusions:   PRN Meds: acetaminophen **OR** acetaminophen, benzonatate, ipratropium-albuterol, LORazepam, ondansetron (ZOFRAN) IV, ondansetron, oxyCODONE, polyethylene glycol  Physical Exam         General: Alert, awake, in no acute distress. Thin and frail. Sitting in bedside chair.  HEENT: No bruits, no goiter, no JVD Heart: Regular rate and rhythm. No murmur appreciated. Lungs: Dinminished air movement Abdomen: Soft, nontender, nondistended, positive bowel sounds.  Ext: No significant edema Skin: Warm and dry Neuro:  Grossly intact, nonfocal.    Vital Signs: BP 127/71 (BP Location: Right Arm)   Pulse 70   Temp 98.6 F (37 C) (Oral)   Resp 17   Ht _0  (1.803 m)   Wt 57.3 kg (126 lb 4.8 oz)   SpO2 98%   BMI 17.62 kg/m  SpO2: SpO2: 98 % O2 Device: O2 Device: Room Air O2 Flow Rate: O2 Flow Rate (L/min): 2 L/min  Intake/output summary:   Intake/Output Summary (Last 24 hours) at 03/19/2017 1317 Last data filed  at 03/19/2017 1027 Gross per 24 hour  Intake 462 ml  Output 550 ml  Net -88 ml   LBM: Last BM Date: 03/17/17 Baseline Weight: Weight: 59 kg (130 lb) Most recent weight: Weight: 57.3 kg (126 lb 4.8 oz)       Palliative Assessment/Data:    Flowsheet Rows     Most Recent Value  Intake Tab  Referral Department  Hospitalist  Unit at Time of Referral  Oncology Unit  Palliative Care Primary Diagnosis  Cancer  Date Notified  03/17/17  Palliative Care Type  New Palliative care  Reason for referral  Pain, Clarify Goals of Care  Date of Admission  03/09/17  Date first seen by Palliative Care  03/18/17  # of days Palliative referral response time  1 Day(s)  # of days IP prior to Palliative referral  8  Clinical Assessment  Palliative Performance Scale Score  60%  Pain Max last 24 hours  9  Pain Min Last 24 hours  3  Psychosocial & Spiritual Assessment  Palliative Care Outcomes  Patient/Family meeting held?  Yes  Who was at the meeting?  Patient  Palliative Care Outcomes  Improved pain interventions      Patient Active Problem List   Diagnosis Date Noted  . Leucocytosis 03/16/2017  . CKD (chronic kidney disease), stage III ([place]) 03/12/2017  . Malnutrition of moderate degree 03/10/2017  . COPD with acute exacerbation ([place]) 03/09/2017  . Anemia 02/07/2017  . Severe malnutrition ([place]) 01/03/2017  . DOE (dyspnea on exertion) 12/30/2016  . Nausea   . Hypervolemia   . HCAP (healthcare-associated pneumonia) 12/19/2016  . Dyspnea and respiratory abnormality 12/19/2016  .  Erosive gastropathy 12/18/2016  . Gastritis 12/18/2016  . Hematemesis 12/16/2016  . Intractable nausea and vomiting 12/16/2016  . HTN (hypertension) 10/30/2016  . Chronic obstructive pulmonary disease ([place]) 09/02/2016  . COPD (chronic obstructive pulmonary disease) ([place]) 08/19/2016  . Dehydration 06/04/2016  . Abdominal pain 06/04/2016  . 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 Adenocarcinoma of left lung, stage 4 ([place]) 05/02/2016  . Goals of care, counseling/discussion 05/02/2016  . Encounter for antineoplastic chemotherapy 05/02/2016  . Tobacco abuse 05/30/2011  . Alcohol abuse 05/30/2011  . Seizure ([place]) 05/28/2011    Palliative Care Assessment & Plan   Patient Profile: 57 y.o. male  with past medical history of COPD and stage IV NSCLC, spinal mets, s/p radiation therapy, currently undergoing chemotherapy, seizure d/o, diastolic dysfunction admitted on 03/09/2017 with COPD exacerbation.  He has continued to have increased SOB with any ambulation.  Palliative consulted for symptom management and goals of care.   Assessment: Patient Active Problem List   Diagnosis Date Noted  . Leucocytosis 03/16/2017  . CKD (chronic kidney disease), stage III ([place]) 03/12/2017  . Malnutrition of moderate degree 03/10/2017  . COPD with acute exacerbation ([place]) 03/09/2017  . Anemia 02/07/2017  . Severe malnutrition ([place]) 01/03/2017  . DOE (dyspnea on exertion) 12/30/2016  . Nausea   . Hypervolemia   . HCAP (healthcare-associated pneumonia) 12/19/2016  . Dyspnea and respiratory abnormality 12/19/2016  . Erosive gastropathy 12/18/2016  . Gastritis 12/18/2016  . Hematemesis 12/16/2016  . Intractable nausea and vomiting 12/16/2016  . HTN (hypertension) 10/30/2016  . Chronic obstructive pulmonary disease ([place]) 09/02/2016  . COPD (chronic obstructive pulmonary disease) ([place]) 08/19/2016  . Dehydration 06/04/2016  . Abdominal pain 06/04/2016  . 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 Adenocarcinoma of  left lung, stage 4 ([place]) 05/02/2016  . Goals of care, counseling/discussion  05/02/2016  . Encounter for antineoplastic chemotherapy 05/02/2016  . Tobacco abuse 05/30/2011  . Alcohol abuse 05/30/2011  . Seizure ([place]) 05/28/2011   Recommendations/Plan:  Full code- Recommended that he continue to discuss this with his family, oncologist and pulonologist following discharge.  We discussed benefits/burdens of heroic interventions in advanced cancer at the time of respiratory or cardiac arrest.  He reports needing to discuss with family prior to considering any changes. - Dyspnea- With his renal impairment, I recommend continuing oxycodone 34m every 4 hours as needed for pain or SOB.  Could consider starting low dose long acting medication if he demonstrates requiring medication around the clock once he is discharged home. - Advance directives- He would like to name his mother and son as his surrogates.  Placed referral to spiritual care to facilitate.  Goals of Care and Additional Recommendations:  Limitations on Scope of Treatment: Full Scope Treatment  Code Status:    Code Status Orders  (From admission, onward)        Start     Ordered   03/09/17 2359  Full code  Continuous     03/09/17 2359    Code Status History    Date Active Date Inactive Code Status Order ID Comments User Context   02/14/2017 09:02 02/16/2017 14:55 Full Code [id number] XFlorencia Reasons MD Inpatient   02/10/2017 00:31 02/14/2017 09:02 DNR [id number] SNorval Morton MD ED   12/30/2016 23:25 01/07/2017 21:09 DNR [id number] PElodia [name], MD Inpatient   12/17/2016 09:34 12/24/2016 18:11 DNR [id number] SThurnell Lose MD Inpatient   12/16/2016 16:49 12/17/2016 09:34 Full Code [id number] FCharlynne Cousins MD Inpatient       Care plan was discussed with patient, RN  Thank you for allowing the Palliative Medicine Team to assist in the care of this patient.   Total Time 30 Prolonged Time Billed No        Greater than 50%  of this time was spent counseling and coordinating care related to the above assessment and plan.  GMicheline Rough MD  Please contact Palliative Medicine Team phone at [phone] questions and concerns.

## 2017-03-20 ENCOUNTER — Emergency Department (HOSPITAL_COMMUNITY)
Admission: EM | Admit: 2017-03-20 | Discharge: 2017-03-20 | Disposition: A | Discharge status: Home / Self Care | Payer: Medicaid Other | Attending: Emergency Medicine | Admitting: Emergency Medicine

## 2017-03-20 ENCOUNTER — Encounter (HOSPITAL_COMMUNITY): Payer: Self-pay

## 2017-03-20 ENCOUNTER — Other Ambulatory Visit: Payer: Self-pay

## 2017-03-20 DIAGNOSIS — Z95828 Presence of other vascular implants and grafts: Secondary | ICD-10-CM | POA: Diagnosis not present

## 2017-03-20 DIAGNOSIS — N183 Chronic kidney disease, stage 3 (moderate): Secondary | ICD-10-CM | POA: Diagnosis not present

## 2017-03-20 DIAGNOSIS — C3490 Malignant neoplasm of unspecified part of unspecified bronchus or lung: Secondary | ICD-10-CM | POA: Insufficient documentation

## 2017-03-20 DIAGNOSIS — I129 Hypertensive chronic kidney disease with stage 1 through stage 4 chronic kidney disease, or unspecified chronic kidney disease: Secondary | ICD-10-CM | POA: Diagnosis not present

## 2017-03-20 DIAGNOSIS — R0602 Shortness of breath: Secondary | ICD-10-CM | POA: Diagnosis not present

## 2017-03-20 DIAGNOSIS — F17210 Nicotine dependence, cigarettes, uncomplicated: Secondary | ICD-10-CM | POA: Insufficient documentation

## 2017-03-20 DIAGNOSIS — J449 Chronic obstructive pulmonary disease, unspecified: Secondary | ICD-10-CM | POA: Diagnosis not present

## 2017-03-20 DIAGNOSIS — Z79899 Other long term (current) drug therapy: Secondary | ICD-10-CM | POA: Diagnosis not present

## 2017-03-20 MED ORDER — IPRATROPIUM BROMIDE 0.02 % IN SOLN
0.5000 mg | Freq: Once | RESPIRATORY_TRACT | Status: AC
  Administered 2017-03-20: 0.5 mg via RESPIRATORY_TRACT
  Filled 2017-03-20: qty 2.5

## 2017-03-20 MED ORDER — ALBUTEROL SULFATE (2.5 MG/3ML) 0.083% IN NEBU
5.0000 mg | INHALATION_SOLUTION | Freq: Once | RESPIRATORY_TRACT | Status: AC
  Administered 2017-03-20: 5 mg via RESPIRATORY_TRACT
  Filled 2017-03-20: qty 6

## 2017-03-20 NOTE — Discharge Instructions (Signed)
Please see the information and instructions below regarding your visit.  Your diagnoses today include:  1. Shortness of breath     Tests performed today include: See side panel of your discharge paperwork for testing performed today. Vital signs are listed at the bottom of these instructions.   Medications prescribed:    Take any prescribed medications only as prescribed, and any over the counter medications only as directed on the packaging.  Please continue all your home medications.  Home care instructions:  Please follow any educational materials contained in this packet.   Follow-up instructions: Please follow up with [name] on 03/25/17.  Return instructions:  Please return to the Emergency Department if you experience worsening symptoms.  Please return the emergency department for any increasing shortness of breath, chest pain, dizziness, lightheadedness, feeling that you are going to pass out, or passing out. Please return if you have any other emergent concerns.  Additional Information:   Your vital signs today were: BP 124/82 (BP Location: Left Arm)    Pulse 79    Temp 97.9 F (36.6 C) (Oral)    Resp 18    Ht 5\' 11"  (1.803 m)    Wt 57.2 kg (126 lb)    SpO2 97%    BMI 17.57 kg/m  If your blood pressure (BP) was elevated on multiple readings during this visit above 130 for the top number or above 80 for the bottom number, please have this repeated by your primary care provider within one month. --------------  Thank you for allowing [place] to participate in your care today.

## 2017-03-20 NOTE — ED Notes (Signed)
[facility] called for trasport

## 2017-03-20 NOTE — ED Provider Notes (Signed)
[facility] DEPT Provider Note   CSN: [id number] Arrival date & time: 03/20/17  1253     History   Chief Complaint Chief Complaint  Patient presents with  . Shortness of Breath    HPI [name] is a 57 y.o. male.  HPI  Patient is a 57 year old male with a history of adenocarcinoma of the lung (followed by [name]), and COPD presenting for shortness of breath while he was ambulating to the store today.  Patient reports that his oxygen tank that he carried with him ran out of the store, and he became extremely anxious as he did not have access to oxygen.  Patient called his landlord, who came to pick him up and the store clerk also called 911.  Patient was given oxygen per EMS.  Patient confirmed that he does have oxygen tanks at home, in the skilled nursing facility where he lives at [facility].  Patient does have medication assistance daily.  Patient denies any new chest pain, shortness of breath since arriving at the hospital, increasing cough, abdominal pain, nausea, vomiting, increasing lower extremity edema, or fever or chills.  Of note, patient was just discharged from the hospital yesterday with COPD exacerbation.  Patient was treated with steroid taper, doxycycline, and home Spiriva and Dulera.  Past Medical History:  Diagnosis Date  . Abdominal pain 06/04/2016  . Adenocarcinoma of left lung, stage 4 ([place]) 05/02/2016  . Bronchitis due to tobacco use ([place])   . Cancer ([place])    Lung  . Dehydration 06/04/2016  . Encounter for antineoplastic chemotherapy 05/02/2016  . Goals of care, counseling/discussion 05/02/2016  . HTN (hypertension) 10/30/2016  . Recurrent upper respiratory infection (URI)   . Seizures ([place]) 05/2011   new onset  . Shortness of breath     Patient Active Problem List   Diagnosis Date Noted  . Leucocytosis 03/16/2017  . CKD (chronic kidney disease), stage III ([place]) 03/12/2017  . Malnutrition of moderate degree  03/10/2017  . COPD with acute exacerbation ([place]) 03/09/2017  . Anemia 02/07/2017  . Severe malnutrition ([place]) 01/03/2017  . DOE (dyspnea on exertion) 12/30/2016  . Nausea   . Hypervolemia   . HCAP (healthcare-associated pneumonia) 12/19/2016  . Dyspnea and respiratory abnormality 12/19/2016  . Erosive gastropathy 12/18/2016  . Gastritis 12/18/2016  . Hematemesis 12/16/2016  . Intractable nausea and vomiting 12/16/2016  . HTN (hypertension) 10/30/2016  . Chronic obstructive pulmonary disease ([place]) 09/02/2016  . COPD (chronic obstructive pulmonary disease) ([place]) 08/19/2016  . Dehydration 06/04/2016  . Abdominal pain 06/04/2016  . 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 Adenocarcinoma of left lung, stage 4 ([place]) 05/02/2016  . Goals of care, counseling/discussion 05/02/2016  . Encounter for antineoplastic chemotherapy 05/02/2016  . Tobacco abuse 05/30/2011  . Alcohol abuse 05/30/2011  . Seizure ([place]) 05/28/2011    Past Surgical History:  Procedure Laterality Date  . ESOPHAGOGASTRODUODENOSCOPY (EGD) WITH PROPOFOL N/A 12/17/2016   Procedure: ESOPHAGOGASTRODUODENOSCOPY (EGD) WITH PROPOFOL;  Surgeon: Ladene Artist, MD;  Location: [facility] ENDOSCOPY;  Service: Endoscopy;  Laterality: N/A;  . IR FLUORO GUIDE PORT INSERTION RIGHT  05/13/2016  . IR US GUIDE VASC ACCESS RIGHT  05/13/2016  . NO PAST SURGERIES         Home Medications    Prior to Admission medications   Medication Sig Start Date End Date Taking? Authorizing Provider  acetaminophen (TYLENOL) 500 MG tablet Take 500 mg by mouth 2 (two) times daily as needed for mild pain  or headache.    [provider]  amLODipine (NORVASC) 10 MG tablet Take 10 mg by mouth daily.    [provider]  bisoprolol (ZEBETA) 5 MG tablet Take 1 tablet (5 mg total) by mouth daily. 02/16/17   Florencia Reasons, MD  Chlorphen-Phenyleph-ASA (ALKA-SELTZER PLUS COLD PO) Take 2 tablets by mouth at bedtime as needed (COUGH).    [provider]    dexamethasone (DECADRON) 4 MG tablet Take 4 mg by mouth as directed. Take 1 tablet twice daily the day before, the day of and the day after chemotherapy every 3 weeks.    [provider]  feeding supplement, ENSURE ENLIVE, (ENSURE ENLIVE) LIQD Take 237 mLs by mouth 3 (three) times daily between meals. 03/19/17   Marene Lenz, MD  fluconazole (DIFLUCAN) 100 MG tablet Take 1 tablet (100 mg total) by mouth daily. 03/20/17   Marene Lenz, MD  furosemide (LASIX) 40 MG tablet Take 1 tablet (40 mg total) by mouth every three (3) days as needed for fluid or edema. 02/16/17 03/18/17  Florencia Reasons, MD  guaiFENesin (MUCINEX) 600 MG 12 hr tablet Take 1 tablet (600 mg total) by mouth 2 (two) times daily. 03/19/17   Marene Lenz, MD  Ipratropium-Albuterol (COMBIVENT) 20-100 MCG/ACT AERS respimat Inhale 1 puff into the lungs every 6 (six) hours. 07/22/16   Curt Bears, MD  lidocaine-prilocaine (EMLA) cream Apply 1 application topically as needed.    [provider]  loratadine (CLARITIN) 10 MG tablet Take 10 mg by mouth daily.    [provider]  mometasone-formoterol (DULERA) 100-5 MCG/ACT AERO Inhale 2 puffs into the lungs 2 (two) times daily. 03/19/17   Marene Lenz, MD  ondansetron (ZOFRAN-ODT) 4 MG disintegrating tablet Take 4 mg by mouth every 8 (eight) hours as needed for nausea or vomiting.    [provider]  Oxycodone HCl 10 MG TABS Take 1 tablet (10 mg total) by mouth every 4 (four) hours as needed (or shortness of breath). 03/19/17   Marene Lenz, MD  pantoprazole (PROTONIX) 40 MG tablet Take 1 tablet (40 mg total) by mouth 2 (two) times daily. 12/18/16   Eugenie Filler, MD  polyethylene glycol [facility] / Floria Raveling) packet Take 17 g by mouth daily as needed for mild constipation. 03/19/17   Marene Lenz, MD  predniSONE (DELTASONE) 10 MG tablet 4 pills daily for 2 days then 2 pill daily for 3 days then single  pill daily for 3 days then  stop 03/19/17   Adhikari Bk, Amrit, MD  senna-docusate (SENNA-PLUS) 8.6-50 MG tablet Take 1 tablet by mouth at bedtime.    [provider]  tiotropium (SPIRIVA) 18 MCG inhalation capsule Place 1 capsule (18 mcg total) into inhaler and inhale daily. 03/20/17   Marene Lenz, MD    Family History Family History  Problem Relation Age of Onset  . Cancer Father   . Diabetes Mellitus II Mother     Social History Social History   Tobacco Use  . Smoking status: Current Some Day Smoker    Packs/day: 0.10    Years: 30.00    Pack years: 3.00    Types: Cigarettes  . Smokeless tobacco: Never Used  Substance Use Topics  . Alcohol use: No    Frequency: Never  . Drug use: No     Allergies   Patient has no known allergies.   Review of Systems Review of Systems  Constitutional: Negative for chills and  fever.  HENT: Negative for congestion and rhinorrhea.   Respiratory: Positive for cough, shortness of breath and wheezing. Negative for chest tightness.   Cardiovascular: Negative for chest pain, palpitations and leg swelling.  Gastrointestinal: Negative for abdominal pain, nausea and vomiting.  Musculoskeletal: Negative for arthralgias, back pain and myalgias.  Skin: Negative for color change and rash.  All other systems reviewed and are negative.    Physical Exam Updated Vital Signs BP 124/82 (BP Location: Left Arm)   Pulse 79   Temp 97.9 F (36.6 C) (Oral)   Resp 18   Ht 5\' 11"  (1.803 m)   Wt 57.2 kg (126 lb)   SpO2 97%   BMI 17.57 kg/m   Physical Exam  Constitutional: He appears well-developed and well-nourished. No distress.  HENT:  Head: Normocephalic and atraumatic.  Oral thrush noted on tongue.  Eyes: Conjunctivae and EOM are normal. Pupils are equal, round, and reactive to light.  Neck: Normal range of motion. Neck supple.  Cardiovascular: Normal rate, regular rhythm, S1 normal and S2 normal.  No murmur heard. Slight increase in edema of right lower  extremity, which patient reports is chronic.  Pulmonary/Chest: Effort normal. No accessory muscle usage. No respiratory distress. He has wheezes. He has rales.  Decreased lung sounds in right lung field.  Abdominal: Soft. He exhibits no distension. There is no tenderness. There is no guarding.  Musculoskeletal: Normal range of motion. He exhibits no edema or deformity.  Lymphadenopathy:    He has no cervical adenopathy.  Neurological: He is alert.  Cranial nerves grossly intact. Patient moves extremities symmetrically and with good coordination.  Skin: Skin is warm and dry. No rash noted. No erythema.  Psychiatric: He has a normal mood and affect. His behavior is normal. Judgment and thought content normal.  Nursing note and vitals reviewed.    ED Treatments / Results  Labs (all labs ordered are listed, but only abnormal results are displayed) Labs Reviewed - No data to display  EKG  EKG Interpretation None       Radiology No results found.  Procedures Procedures (including critical care time)  Medications Ordered in ED Medications  albuterol (PROVENTIL) (2.5 MG/3ML) 0.083% nebulizer solution 5 mg (5 mg Nebulization Given 03/20/17 1635)  ipratropium (ATROVENT) nebulizer solution 0.5 mg (0.5 mg Nebulization Given 03/20/17 1635)     Initial Impression / Assessment and Plan / ED Course  I have reviewed the triage vital signs and the nursing notes.  Pertinent labs & imaging results that were available during my care of the patient were reviewed by me and considered in my medical decision making (see chart for details).  Clinical Course as of Mar 20 1849  Thu Mar 20, 2017  1607 Pleasant 57 year old male with severe COPD here after having a malfunction his oxygen to he became very anxious and short of breath and called EMS where he got a breathing treatment and oxygen and he is returned back to his baseline.  Currently here he is satting well on nasal cannula and feels he can  return home.  I doubt that he needs an acute medical workup for this as he became improved after just getting back on some oxygen with a breathing treatment which she has access to back home.  We did ambulate him and make sure his pulse ox is reasonable and if so he can return back to his facility.  [MB]    Clinical Course User Index [MB] Hayden Rasmussen, MD  Patient is nontoxic-appearing, afebrile, and non-tachycardic.  Patient exhibiting no signs of respiratory compromise in the emergency department.  Once patient was able to access oxygen, which he has at home and has supplies of, patient reports he is feeling per his baseline.  Do not have concerns at this time for ACS, pulmonary embolism, or COPD exacerbation.  Patient was ambulated in the emergency department without difficulty, and without shortness of breath.  Per discussion with Dr. Aletta Edouard, who independently evaluated the patient, do not feel that further medical workup is needed.  Patient is given return precautions for any chest pain, shortness of breath, increasing weakness, dizziness, lightheadedness, syncope or presyncope.  Patient is in understanding and agrees with the plan of care.  Ensure that patient had all medications for steroid taper, and from discharge from the hospital yesterday.  Patient also reports that he has oral thrush treatment at home.  This is a shared visit with Dr. Aletta Edouard. Patient was independently evaluated by this attending physician. Attending physician consulted in evaluation and discharge management.   Final Clinical Impressions(s) / ED Diagnoses   Final diagnoses:  Shortness of breath    ED Discharge Orders    None       Tamala Julian 03/20/17 1902    Hayden Rasmussen, MD 03/22/17 1125

## 2017-03-20 NOTE — ED Triage Notes (Signed)
Pt from [place] was taking a walk when his oxygen tank became empty, and became short of breath and anxious. Pt was not wanting to be transported, but while walking to the car to be transported back to facility, pt became anxious, dizzy, and short of breath as he had no oxygen in the tank. Pt reports he filled both of his own O2 tanks up this morning, he states this is an ongoing problem. Hx of lung cancer and home O2 use. Pt calm and stable at this time, speaking full sentences.

## 2017-03-20 NOTE — ED Notes (Signed)
Patient ambulated with 02 stats at 98%

## 2017-03-25 ENCOUNTER — Inpatient Hospital Stay: Payer: Medicaid Other

## 2017-03-25 ENCOUNTER — Inpatient Hospital Stay (HOSPITAL_BASED_OUTPATIENT_CLINIC_OR_DEPARTMENT_OTHER): Payer: Medicaid Other | Admitting: Oncology

## 2017-03-25 ENCOUNTER — Telehealth: Payer: Self-pay | Admitting: Oncology

## 2017-03-25 ENCOUNTER — Inpatient Hospital Stay: Payer: Medicaid Other | Attending: Internal Medicine

## 2017-03-25 ENCOUNTER — Telehealth: Payer: Self-pay | Admitting: *Deleted

## 2017-03-25 ENCOUNTER — Encounter: Payer: Self-pay | Admitting: Oncology

## 2017-03-25 VITALS — BP 159/90 | HR 84 | Temp 98.6°F | Resp 17 | Ht 71.0 in | Wt 132.1 lb

## 2017-03-25 DIAGNOSIS — C3492 Malignant neoplasm of unspecified part of left bronchus or lung: Secondary | ICD-10-CM

## 2017-03-25 DIAGNOSIS — J449 Chronic obstructive pulmonary disease, unspecified: Secondary | ICD-10-CM | POA: Diagnosis not present

## 2017-03-25 DIAGNOSIS — Z5111 Encounter for antineoplastic chemotherapy: Secondary | ICD-10-CM | POA: Diagnosis present

## 2017-03-25 DIAGNOSIS — Z9221 Personal history of antineoplastic chemotherapy: Secondary | ICD-10-CM | POA: Insufficient documentation

## 2017-03-25 DIAGNOSIS — C7951 Secondary malignant neoplasm of bone: Secondary | ICD-10-CM

## 2017-03-25 DIAGNOSIS — I10 Essential (primary) hypertension: Secondary | ICD-10-CM | POA: Diagnosis not present

## 2017-03-25 LAB — CBC WITH DIFFERENTIAL/PLATELET
Basophils Absolute: 0.1 10*3/uL (ref 0.0–0.1)
Basophils Relative: 1 %
EOS ABS: 0 10*3/uL (ref 0.0–0.5)
EOS PCT: 0 %
HCT: 33.9 % — ABNORMAL LOW (ref 38.4–49.9)
Hemoglobin: 11.1 g/dL — ABNORMAL LOW (ref 13.0–17.1)
LYMPHS ABS: 0.1 10*3/uL — AB (ref 0.9–3.3)
Lymphocytes Relative: 1 %
MCH: 33.4 pg (ref 27.2–33.4)
MCHC: 32.7 g/dL (ref 32.0–36.0)
MCV: 102.2 fL — ABNORMAL HIGH (ref 79.3–98.0)
MONOS PCT: 3 %
Monocytes Absolute: 0.4 10*3/uL (ref 0.1–0.9)
Neutro Abs: 13.5 10*3/uL — ABNORMAL HIGH (ref 1.5–6.5)
Neutrophils Relative %: 95 %
PLATELETS: 163 10*3/uL (ref 140–400)
RBC: 3.32 MIL/uL — ABNORMAL LOW (ref 4.20–5.82)
RDW: 18.4 % — AB (ref 11.0–14.6)
WBC: 14.2 10*3/uL — AB (ref 4.0–10.3)

## 2017-03-25 LAB — COMPREHENSIVE METABOLIC PANEL
ALBUMIN: 3.7 g/dL (ref 3.5–5.0)
ALK PHOS: 130 U/L (ref 40–150)
ALT: 28 U/L (ref 0–55)
AST: 27 U/L (ref 5–34)
Anion gap: 10 (ref 3–11)
BILIRUBIN TOTAL: 0.7 mg/dL (ref 0.2–1.2)
BUN: 24 mg/dL (ref 7–26)
CALCIUM: 9.9 mg/dL (ref 8.4–10.4)
CO2: 29 mmol/L (ref 22–29)
Chloride: 100 mmol/L (ref 98–109)
Creatinine, Ser: 1.48 mg/dL — ABNORMAL HIGH (ref 0.70–1.30)
GFR calc non Af Amer: 51 mL/min — ABNORMAL LOW (ref >60)
GFR, EST AFRICAN AMERICAN: 59 mL/min — AB (ref >60)
GLUCOSE: 85 mg/dL (ref 70–140)
POTASSIUM: 4.4 mmol/L (ref 3.5–5.1)
SODIUM: 139 mmol/L (ref 136–145)
TOTAL PROTEIN: 7.4 g/dL (ref 6.4–8.3)

## 2017-03-25 MED ORDER — HEPARIN SOD (PORK) LOCK FLUSH 100 UNIT/ML IV SOLN
500.0000 [IU] | Freq: Once | INTRAVENOUS | Status: AC | PRN
  Administered 2017-03-25: 500 [IU]
  Filled 2017-03-25: qty 5

## 2017-03-25 MED ORDER — CYANOCOBALAMIN 1000 MCG/ML IJ SOLN
INTRAMUSCULAR | Status: AC
  Filled 2017-03-25: qty 1

## 2017-03-25 MED ORDER — CYANOCOBALAMIN 1000 MCG/ML IJ SOLN
1000.0000 ug | Freq: Once | INTRAMUSCULAR | Status: AC
  Administered 2017-03-25: 1000 ug via INTRAMUSCULAR

## 2017-03-25 MED ORDER — PROCHLORPERAZINE MALEATE 10 MG PO TABS
ORAL_TABLET | ORAL | Status: AC
Start: 2017-03-25 — End: 2017-03-25
  Filled 2017-03-25: qty 1

## 2017-03-25 MED ORDER — SODIUM CHLORIDE 0.9% FLUSH
10.0000 mL | INTRAVENOUS | Status: DC | PRN
  Administered 2017-03-25: 10 mL
  Filled 2017-03-25: qty 10

## 2017-03-25 MED ORDER — PROCHLORPERAZINE MALEATE 10 MG PO TABS
10.0000 mg | ORAL_TABLET | Freq: Once | ORAL | Status: AC
  Administered 2017-03-25: 10 mg via ORAL

## 2017-03-25 MED ORDER — SODIUM CHLORIDE 0.9 % IV SOLN
510.0000 mg/m2 | Freq: Once | INTRAVENOUS | Status: AC
  Administered 2017-03-25: 900 mg via INTRAVENOUS
  Filled 2017-03-25: qty 36

## 2017-03-25 MED ORDER — SODIUM CHLORIDE 0.9 % IV SOLN
Freq: Once | INTRAVENOUS | Status: AC
  Administered 2017-03-25: 14:00:00 via INTRAVENOUS

## 2017-03-25 NOTE — Telephone Encounter (Signed)
Scheduled appt per 3/5 los - patient to get an updated schedule in the treatment area.

## 2017-03-25 NOTE — Assessment & Plan Note (Signed)
This is a very pleasant 57 year old African-American male with a stage IV non-small cell lung cancer, adenocarcinoma with no actionable mutations. He underwent systemic chemotherapy with carboplatin, Alimta and Avastin status post 6 cycles and has been tolerating the treatment fairly well. He is currently undergoing maintenance treatment with Alimta 500 MG/M2 every 3 weeks, status post5cycles. He has been hospitalized twice since his last visit with [place] for COPD exacerbation. He is now here to resume his treatment with Alimta.  The patient was seen with [name].  Hospital records were reviewed.  Patient had a CT angiogram performed on 03/13/2017 which showed stable disease in terms of his lung cancer.  This exam was also negative for pulmonary embolus.  Recommend that he resume his maintenance treatment with single agent Alimta at 500 mg/m every 3 weeks.  He will proceed with cycle 6 as scheduled today. The patient will follow-up in 3 weeks for evaluation prior to cycle #7.  For pain management, recommend that he continue on the oxycodone 10 mg every 4 hours as needed.  For his COPD, he will follow-up with pulmonology.  For hypertension, the patient will continue with his current blood pressure medication.  The patient was advised to call immediately if he has any concerning symptoms in the interval. The patient voices understanding of current disease status and treatment options and is in agreement with the current care plan. All questions were answered. The patient knows to call the clinic with any problems, questions or concerns. We can certainly see the patient much sooner if necessary.

## 2017-03-25 NOTE — Progress Notes (Signed)
[place] OFFICE PROGRESS NOTE  Patient, No Pcp Per No address on file  DIAGNOSIS: Stage IV (T3, N3, M1c) non-small cell lung cancer, moderately to poorly differentiated adenocarcinoma diagnosed in February 2018 with negative EGFR, ALK, ROS1and BRAF mutations, with PDL1 expression 15-20 %.  PRIOR THERAPY: 1) Palliative radiotherapy to the T12 and L3 completed on 05/27/2016 under the care of Dr. Tammi Klippel. 2) Systemic chemotherapy with carboplatin for AUC of 5, Alimta 500 MG/M2 and Avastin 15 MG/KG every 3 weeks. Status post 6 cycles with partial response.  CURRENT THERAPY: Maintenance treatment with single agent Alimta 500 MG/M2 every 3 weeks. First dose 10/09/2016. Status post5cycles.  INTERVAL HISTORY: [name] 57 y.o. male returns for routine follow-up visit by himself.  Patient has missed several appointments with [place] due to hospitalizations for COPD exacerbations.  His last chemotherapy was on 01/17/2017.  Patient is now residing at an assisted living facility.  He reports that he feels short of breath all the time.  He is currently on oxygen at 2 L/min.  He reports that he is still having a fair amount of pain in his back and his legs.  He was previously on MSIR 15 mg every 4 hours as needed but was changed to oxycodone 10 mg every 4 hours as needed for shortness of breath.  The patient denies fevers and chills.  Denies chest pain, cough, hemoptysis.  Denies nausea, vomiting, constipation, diarrhea.  He notes some swelling in his feet.  The patient is here for evaluation prior to cycle #6 of his chemotherapy.  MEDICAL HISTORY: Past Medical History:  Diagnosis Date  . Abdominal pain 06/04/2016  . Adenocarcinoma of left lung, stage 4 ([place]) 05/02/2016  . Bronchitis due to tobacco use ([place])   . Cancer ([place])    Lung  . Dehydration 06/04/2016  . Encounter for antineoplastic chemotherapy 05/02/2016  . Goals of care, counseling/discussion 05/02/2016  . HTN  (hypertension) 10/30/2016  . Recurrent upper respiratory infection (URI)   . Seizures ([place]) 05/2011   new onset  . Shortness of breath     ALLERGIES:  has No Known Allergies.  MEDICATIONS:  Current Outpatient Medications  Medication Sig Dispense Refill  . acetaminophen (TYLENOL) 500 MG tablet Take 500 mg by mouth 2 (two) times daily as needed for mild pain or headache.    Marland Kitchen amLODipine (NORVASC) 10 MG tablet Take 10 mg by mouth daily.    . bisoprolol (ZEBETA) 5 MG tablet Take 1 tablet (5 mg total) by mouth daily. 30 tablet 0  . Chlorphen-Phenyleph-ASA (ALKA-SELTZER PLUS COLD PO) Take 2 tablets by mouth at bedtime as needed (COUGH).    Marland Kitchen dexamethasone (DECADRON) 4 MG tablet Take 4 mg by mouth as directed. Take 1 tablet twice daily the day before, the day of and the day after chemotherapy every 3 weeks.    . feeding supplement, ENSURE ENLIVE, (ENSURE ENLIVE) LIQD Take 237 mLs by mouth 3 (three) times daily between meals. 237 mL 2  . fluconazole (DIFLUCAN) 100 MG tablet Take 1 tablet (100 mg total) by mouth daily. 4 tablet 0  . furosemide (LASIX) 40 MG tablet Take 1 tablet (40 mg total) by mouth every three (3) days as needed for fluid or edema. 10 tablet 0  . guaiFENesin (MUCINEX) 600 MG 12 hr tablet Take 1 tablet (600 mg total) by mouth 2 (two) times daily. 14 tablet 0  . Ipratropium-Albuterol (COMBIVENT) 20-100 MCG/ACT AERS respimat Inhale 1 puff into the lungs every  6 (six) hours. 1 Inhaler 0  . lidocaine-prilocaine (EMLA) cream Apply 1 application topically as needed.    . loratadine (CLARITIN) 10 MG tablet Take 10 mg by mouth daily.    . mometasone-formoterol (DULERA) 100-5 MCG/ACT AERO Inhale 2 puffs into the lungs 2 (two) times daily. 13 g 0  . ondansetron (ZOFRAN-ODT) 4 MG disintegrating tablet Take 4 mg by mouth every 8 (eight) hours as needed for nausea or vomiting.    . Oxycodone HCl 10 MG TABS Take 1 tablet (10 mg total) by mouth every 4 (four) hours as needed (or shortness of  breath). 30 tablet 0  . pantoprazole (PROTONIX) 40 MG tablet Take 1 tablet (40 mg total) by mouth 2 (two) times daily. 60 tablet 1  . polyethylene glycol (MIRALAX / GLYCOLAX) packet Take 17 g by mouth daily as needed for mild constipation. 14 each 0  . predniSONE (DELTASONE) 10 MG tablet 4 pills daily for 2 days then 2 pill daily for 3 days then single  pill daily for 3 days then stop 20 tablet 0  . senna-docusate (SENNA-PLUS) 8.6-50 MG tablet Take 1 tablet by mouth at bedtime.    Marland Kitchen tiotropium (SPIRIVA) 18 MCG inhalation capsule Place 1 capsule (18 mcg total) into inhaler and inhale daily. 30 capsule 1   No current facility-administered medications for this visit.    Facility-Administered Medications Ordered in Other Visits  Medication Dose Route Frequency Provider Last Rate Last Dose  . heparin lock flush 100 unit/mL  500 Units Intracatheter Once PRN Curt Bears, MD      . PEMEtrexed (ALIMTA) 900 mg in sodium chloride 0.9 % 100 mL chemo infusion  510 mg/m2 (Treatment Plan Recorded) Intravenous Once Curt Bears, MD   900 mg at 03/25/17 1548  . sodium chloride flush (NS) 0.9 % injection 10 mL  10 mL Intracatheter PRN Curt Bears, MD        SURGICAL HISTORY:  Past Surgical History:  Procedure Laterality Date  . ESOPHAGOGASTRODUODENOSCOPY (EGD) WITH PROPOFOL N/A 12/17/2016   Procedure: ESOPHAGOGASTRODUODENOSCOPY (EGD) WITH PROPOFOL;  Surgeon: Ladene Artist, MD;  Location: [facility];  Service: Endoscopy;  Laterality: N/A;  . IR FLUORO GUIDE PORT INSERTION RIGHT  05/13/2016  . IR US GUIDE VASC ACCESS RIGHT  05/13/2016  . NO PAST SURGERIES      REVIEW OF SYSTEMS:   Review of Systems  Constitutional: Negative for appetite change, chills, fatigue, fever and unexpected weight change.  HENT:   Negative for mouth sores, nosebleeds, sore throat and trouble swallowing.   Eyes: Negative for eye problems and icterus.  Respiratory: Negative for cough, hemoptysis, and wheezing.   Positive for shortness of breath.  Cardiovascular: Negative for chest pain. Positive for swelling in his feet. Gastrointestinal: Negative for abdominal pain, constipation, diarrhea, nausea and vomiting.  Genitourinary: Negative for bladder incontinence, difficulty urinating, dysuria, frequency and hematuria.   Musculoskeletal: Negative for gait problem, neck pain and neck stiffness. Positive for back and leg pain. Skin: Negative for itching and rash.  Neurological: Negative for dizziness, extremity weakness, gait problem, headaches, light-headedness and seizures.  Hematological: Negative for adenopathy. Does not bruise/bleed easily.  Psychiatric/Behavioral: Negative for confusion, depression and sleep disturbance. The patient is not nervous/anxious.     PHYSICAL EXAMINATION:  Blood pressure (!) 159/90, pulse 84, temperature 98.6 F (37 C), temperature source Oral, resp. rate 17, height 5' 11"  (1.803 m), weight 132 lb 1.6 oz (59.9 kg), SpO2 98 %.  ECOG PERFORMANCE STATUS: 1 - Symptomatic  but completely ambulatory  Physical Exam  Constitutional: Oriented to person, place, and time and well-developed, well-nourished, and in no distress. No distress.  HENT:  Head: Normocephalic and atraumatic.  Mouth/Throat: Oropharynx is clear and moist. No oropharyngeal exudate.  Eyes: Conjunctivae are normal. Right eye exhibits no discharge. Left eye exhibits no discharge. No scleral icterus.  Neck: Normal range of motion. Neck supple.  Cardiovascular: Normal rate, regular rhythm, normal heart sounds and intact distal pulses.  1+ pedal edema bilaterally. Pulmonary/Chest: Effort normal and breath sounds normal. No respiratory distress. No wheezes. No rales.  Abdominal: Soft. Bowel sounds are normal. Exhibits no distension and no mass. There is no tenderness.  Musculoskeletal: Normal range of motion.  Lymphadenopathy:    No cervical adenopathy.  Neurological: Alert and oriented to person, place, and time.  Exhibits normal muscle tone. Gait normal. Coordination normal.  Skin: Skin is warm and dry. No rash noted. Not diaphoretic. No erythema. No pallor.  Psychiatric: Mood, memory and judgment normal.  Vitals reviewed.  LABORATORY DATA: Lab Results  Component Value Date   WBC 14.2 (H) 03/25/2017   HGB 11.1 (L) 03/25/2017   HCT 33.9 (L) 03/25/2017   MCV 102.2 (H) 03/25/2017   PLT 163 03/25/2017      Chemistry      Component Value Date/Time   NA 139 03/25/2017 1215   NA 135 (L) 01/23/2017 1028   K 4.4 03/25/2017 1215   K 4.6 01/23/2017 1028   CL 100 03/25/2017 1215   CO2 29 03/25/2017 1215   CO2 26 01/23/2017 1028   BUN 24 03/25/2017 1215   BUN 23.0 01/23/2017 1028   CREATININE 1.48 (H) 03/25/2017 1215   CREATININE 1.6 (H) 01/23/2017 1028      Component Value Date/Time   CALCIUM 9.9 03/25/2017 1215   CALCIUM 9.6 01/23/2017 1028   ALKPHOS 130 03/25/2017 1215   ALKPHOS 129 01/23/2017 1028   AST 27 03/25/2017 1215   AST 21 01/23/2017 1028   ALT 28 03/25/2017 1215   ALT 17 01/23/2017 1028   BILITOT 0.7 03/25/2017 1215   BILITOT 0.98 01/23/2017 1028       RADIOGRAPHIC STUDIES:  Dg Chest 2 View  Result Date: 03/01/2017 CLINICAL DATA:  Shortness of breath, cough, history adenocarcinoma of the LEFT lung, hypertension EXAM: CHEST  2 VIEW COMPARISON:  02/14/2017 FINDINGS: RIGHT jugular Port-A-Cath with tip projecting over cavoatrial junction. Normal heart size, mediastinal contours, and pulmonary vascularity. Emphysematous and minimal bronchitic changes consistent with COPD. Persistent focal opacity at LEFT base question residual infiltrate versus atelectasis. Remaining lungs grossly clear. No pleural effusion or pneumothorax. Bones unremarkable. IMPRESSION: COPD changes with minimal residual atelectasis or infiltrate at LEFT base. Electronically Signed   By: Lavonia Dana M.D.   On: 03/01/2017 20:58   Ct Angio Chest Pe W Or Wo Contrast  Result Date: 03/13/2017 CLINICAL DATA:   Increasing cough, shortness of breath and respiratory distress in a patient with a history of COPD and metastatic non-small cell lung carcinoma. EXAM: CT ANGIOGRAPHY CHEST WITH CONTRAST TECHNIQUE: Multidetector CT imaging of the chest was performed using the standard protocol during bolus administration of intravenous contrast. Multiplanar CT image reconstructions and MIPs were obtained to evaluate the vascular anatomy. CONTRAST:  100 ml ISOVUE-370 IOPAMIDOL (ISOVUE-370) INJECTION 76% COMPARISON:  CT chest 03/01/2017 and 12/31/2017. Single-view of the chest 03/09/2017. FINDINGS: Cardiovascular: No pulmonary embolus is identified. Heart size is normal. No aortic aneurysm. No pericardial effusion. Mediastinum/Nodes: Thyroid gland appears normal. No lymphadenopathy. Mild  thickening of the walls of the is esophagus and changed. Lungs/Pleura: Small to moderate right pleural effusion is unchanged since the most recent study. No left pleural effusion. Lungs demonstrate severe emphysematous disease. Spiculated left upper lobe nodule measures 2.9 x 1.5 cm on axial image 43 of series 6, unchanged since the most recent exam. Upper Abdomen: As seen on prior examinations but more conspicuous today is fluid adjacent to the celiac axis and posterior to the left hepatic lobe measuring approximately 3.9 cm AP x 3.7 cm transverse. Fluid measures at least 5 cm craniocaudal but is incompletely imaged on this study. 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 Musculoskeletal: Skeletal metastases appear unchanged. No acute bony abnormality. Review of the MIP images confirms the above findings. IMPRESSION: Negative for pulmonary embolus or acute cardiopulmonary disease. No change in a small to moderate right pleural effusion. No change in a spiculated left upper lobe density compatible with the patient's history of lung carcinoma. Fluid along the celiac axis in the upper abdomen is incompletely imaged. It has been present on prior exams but has  increased in size. It may represent loculated ascites in this patient with a cirrhotic liver. If indicated, CT abdomen and pelvis with contrast could be used for further evaluation. Severe emphysema (ICD10-J43.9). Electronically Signed   By: Inge Rise M.D.   On: 03/13/2017 16:16   Ct Angio Chest Pe W And/or Wo Contrast  Result Date: 03/02/2017 CLINICAL DATA:  Subacute onset of shortness of breath and productive cough. Leukocytosis. Current history of non-small cell lung cancer on chemotherapy. EXAM: CT ANGIOGRAPHY CHEST WITH CONTRAST TECHNIQUE: Multidetector CT imaging of the chest was performed using the standard protocol during bolus administration of intravenous contrast. Multiplanar CT image reconstructions and MIPs were obtained to evaluate the vascular anatomy. CONTRAST:  111m ISOVUE-370 IOPAMIDOL (ISOVUE-370) INJECTION 76% COMPARISON:  Chest radiograph performed earlier today at 8:49 p.m., and CT of the chest performed 02/14/2017 FINDINGS: Cardiovascular:  There is no evidence of pulmonary embolus. The heart is normal in size. The thoracic aorta is grossly unremarkable. The great vessels are within normal limits. Mediastinum/Nodes: The mediastinum is grossly unremarkable in appearance. Diffuse esophageal wall thickening may reflect esophagitis or chronic inflammation. No pericardial effusion is identified. The thyroid gland is unremarkable. No axillary lymphadenopathy is seen. A right-sided chest port is noted ending about the distal SVC. Lungs/Pleura: A small right pleural effusion is noted, similar in appearance to the prior study. Scarring is again noted at the left lung base. Soft tissue density extending about the left upper lobe is relatively stable in appearance. No pneumothorax is seen. No new masses are identified. Upper Abdomen: The nodular contour of the liver raises concern for hepatic cirrhosis. The unusual hypoattenuated appearance of the central liver is grossly unchanged. A focal  fluid collection superior to the proximal body of the pancreas may reflect a pseudocyst. The visualized portions of the liver are unremarkable. Musculoskeletal: No acute osseous abnormalities are identified. Sclerosis is again noted at vertebral body T12. The visualized musculature is unremarkable in appearance. Review of the MIP images confirms the above findings. IMPRESSION: 1. No evidence of pulmonary embolus. 2. Small right pleural effusion is similar in appearance to the prior study. 3. Relatively stable appearance to soft tissue density extending about the left upper lung lobe, reflecting previously treated malignancy. 4. Diffuse esophageal wall thickening may reflect esophagitis or chronic inflammation. 5. Findings of hepatic cirrhosis. Unusual hypoattenuated appearance of the central liver is stable in appearance. 6. Focal fluid collection  superior to the proximal body of the pancreas may reflect a pseudocyst, similar in appearance to the prior study. Would correlate with pancreatic lab values. Electronically Signed   By: Garald Balding M.D.   On: 03/02/2017 00:36   Dg Chest Port 1 View  Result Date: 03/17/2017 CLINICAL DATA:  57 year old male with increasing shortness of breath. Chronic lung disease and history of lung cancer. EXAM: PORTABLE CHEST 1 VIEW COMPARISON:  Chest CTA 03/13/2017 and earlier. FINDINGS: Portable AP semi upright view at 1133 hrs. Stable lung volumes. Stable cardiac size and mediastinal contours. Stable right chest porta cath. Left lung bullous emphysema and architectural distortion is stable. No pneumothorax or pulmonary edema identified. Small right pleural effusion appears stable. Paucity of bowel gas in the upper abdomen. IMPRESSION: Stable radiographic appearance of the chest since 01/06/2018 with small right pleural effusion superimposed on Emphysema (ICD10-J43.9). No new acute cardiopulmonary abnormality. Electronically Signed   By: Genevie Ann M.D.   On: 03/17/2017 12:22   Dg  Chest Port 1 View  Result Date: 03/09/2017 CLINICAL DATA:  Severe dyspnea. EXAM: PORTABLE CHEST 1 VIEW COMPARISON:  March 01, 2017 FINDINGS: No pneumothorax. Stable right Port-A-Cath. The cardiomediastinal silhouette is unremarkable. Hyperinflation of the lungs. Scarring in the lateral left lung base. No nodules or masses. Emphysematous changes in the lungs. IMPRESSION: Emphysematous changes.  No acute abnormalities. Electronically Signed   By: Dorise Bullion III M.D   On: 03/09/2017 17:46     ASSESSMENT/PLAN:  Adenocarcinoma of left lung, stage 4 ([place]) This is a very pleasant 57 year old African-American male with a stage IV non-small cell lung cancer, adenocarcinoma with no actionable mutations. He underwent systemic chemotherapy with carboplatin, Alimta and Avastin status post 6 cycles and has been tolerating the treatment fairly well. He is currently undergoing maintenance treatment with Alimta 500 MG/M2 every 3 weeks, status post5cycles. He has been hospitalized twice since his last visit with [place] for COPD exacerbation. He is now here to resume his treatment with Alimta.  The patient was seen with [name].  Hospital records were reviewed.  Patient had a CT angiogram performed on 03/13/2017 which showed stable disease in terms of his lung cancer.  This exam was also negative for pulmonary embolus.  Recommend that he resume his maintenance treatment with single agent Alimta at 500 mg/m every 3 weeks.  He will proceed with cycle 6 as scheduled today. The patient will follow-up in 3 weeks for evaluation prior to cycle #7.  For pain management, recommend that he continue on the oxycodone 10 mg every 4 hours as needed.  For his COPD, he will follow-up with pulmonology.  For hypertension, the patient will continue with his current blood pressure medication.  The patient was advised to call immediately if he has any concerning symptoms in the interval. The patient voices understanding  of current disease status and treatment options and is in agreement with the current care plan. All questions were answered. The patient knows to call the clinic with any problems, questions or concerns. We can certainly see the patient much sooner if necessary.  No orders of the defined types were placed in this encounter.   Johnathan Bussing, [name], [name], [name] 03/25/17  ADDENDUM: Hematology/Oncology Attending: I had a face-to-face encounter with the patient today.  I recommended his care plan.  This is a very pleasant 57 years old African-American male with metastatic non-small cell lung cancer, adenocarcinoma status post induction systemic chemotherapy with carboplatin, Alimta and Avastin with  partial response.  He is currently undergoing maintenance treatment with single agent Alimta status post 5 cycles and has been tolerating this treatment fairly well.  He missed cycle #6 by several weeks because of recent hospitalization for COPD exacerbation. He is feeling much better today. I recommended for the patient to resume his treatment with maintenance Alimta and he will start cycle #6 today. I will see the patient back for follow-up visit in 3 weeks for evaluation before starting the next cycle of his treatment. For pain management he will continue his current treatment with oxycodone.   He was advised to call immediately if he has any concerning symptoms in the interval.  Disclaimer: This note was dictated with voice recognition software. Similar sounding words can inadvertently be transcribed and may be missed upon review. Eilleen Kempf, MD 03/25/17

## 2017-03-25 NOTE — Patient Instructions (Signed)
[facility] Discharge Instructions for Patients Receiving Chemotherapy  Today you received the following chemotherapy agents: Pemetrexed (Alimta).  To help prevent nausea and vomiting after your treatment, we encourage you to take your nausea medication as prescribed.  If you develop nausea and vomiting that is not controlled by your nausea medication, call the clinic.   BELOW ARE SYMPTOMS THAT SHOULD BE REPORTED IMMEDIATELY:  *FEVER GREATER THAN 100.5 F  *CHILLS WITH OR WITHOUT FEVER  NAUSEA AND VOMITING THAT IS NOT CONTROLLED WITH YOUR NAUSEA MEDICATION  *UNUSUAL SHORTNESS OF BREATH  *UNUSUAL BRUISING OR BLEEDING  TENDERNESS IN MOUTH AND THROAT WITH OR WITHOUT PRESENCE OF ULCERS  *URINARY PROBLEMS  *BOWEL PROBLEMS  UNUSUAL RASH Items with * indicate a potential emergency and should be followed up as soon as possible.  Feel free to call the clinic should you have any questions or concerns. The clinic phone number is [phone].  Please show the [facility] at check-in to the Emergency Department and triage nurse.

## 2017-03-25 NOTE — Telephone Encounter (Signed)
Multiple attempts to reach facility to discuss oxygen. Each attempt phone rings goies to vm or rings then disconnected. Call to corporate office in [place] for a direct number to facility.Rep advised they only had same # [phone]. Walked to pt in treatment room to confirm #, pt called on his personal phone same #. S/w with [facility], CNA discussed pt does not have oxygen to get home. Request facility transportation pick pt up today and bring his personal tank ( full per the pt) and also return the 2 oxygen tanks he has borrowed from [facility] that are in his room. [facility] confirmed transportation will bring pt personal tank and [place] tanks and pick up pt. No further concerns.

## 2017-03-26 ENCOUNTER — Encounter: Payer: Self-pay | Admitting: General Practice

## 2017-03-26 ENCOUNTER — Other Ambulatory Visit: Payer: Self-pay | Admitting: Medical Oncology

## 2017-03-26 ENCOUNTER — Encounter: Payer: Self-pay | Admitting: *Deleted

## 2017-03-26 DIAGNOSIS — C3492 Malignant neoplasm of unspecified part of left bronchus or lung: Secondary | ICD-10-CM

## 2017-03-26 NOTE — Progress Notes (Signed)
[facility] CSW Progress Note  CSW advised that patient had come to chemo appt yesterday without O2 tank.  RNs worked w patient and facility to address need for patient to bring O2 to appointments.  CSW spoke w [facility] resident care coordinator [phone]).  Per [name], ALF is concerned that patient is not using his O2 properly.  Often "walks to the store without his O2, then he gets into trouble and calls EMS."  Also concerned that patient is not filling up tanks correctly as he seems to run out of O2 too soon.  States that patient wants smaller travel tank to take w him when he's out - currently only has larger O2 tank.  Does have concentrator in his room at facility and refills his own tanks.  Per [name], patient has been resistant to efforts to provide teaching re proper fill procedure and need to use O2 at all times AEB viewing patient in his room this morning without O2 on.  Concerns conveyed to desk nurse and nurse navigator. Facility is also concerned that while they provide transport, patient is not letting them know time/date of appointments - wonder if they can get a copy of appt calendar in order to schedule.  State that patient did not bring one back to facility from appt yesterday.  CSW provided [name] w contact information for desk nurse, CSW and nurse navigator to allow for better communication.    Edwyna Shell, LCSW Clinical Social Worker Phone:  [phone]

## 2017-03-26 NOTE — Progress Notes (Signed)
Oncology Nurse Navigator Documentation  Oncology Nurse Navigator Flowsheets 03/26/2017  Navigator Location [facility]  Navigator Encounter Type Other/[name] is having transportation issues making appts. I spoke with [name] at [name] where he is a resident. I coordinated his transportation needs to the cancer center.    Treatment Phase Treatment  Barriers/Navigation Needs Coordination of Care  Interventions Coordination of Care  Coordination of Care Other  Acuity Level 2  Time Spent with Patient 30

## 2017-03-27 ENCOUNTER — Encounter: Payer: Self-pay | Admitting: *Deleted

## 2017-03-27 NOTE — Progress Notes (Signed)
Oncology Nurse Navigator Documentation  Oncology Nurse Navigator Flowsheets 03/27/2017  Navigator Location [facility]  Navigator Encounter Type Other/I spoke with York Spaniel today CSW.  She states she is not sure but believes patient's facility does not have an updated calender for transportation.  I called [facility] and spoke with Tory. She states she does not have calender. I faxed her a schedule.   Treatment Phase Treatment  Barriers/Navigation Needs Coordination of Care  Interventions Coordination of Care  Coordination of Care Other  Acuity Level 2  Time Spent with Patient 30

## 2017-04-01 ENCOUNTER — Ambulatory Visit: Payer: Medicaid Other | Admitting: Adult Health

## 2017-04-01 ENCOUNTER — Encounter: Payer: Self-pay | Admitting: Adult Health

## 2017-04-01 DIAGNOSIS — C3492 Malignant neoplasm of unspecified part of left bronchus or lung: Secondary | ICD-10-CM | POA: Diagnosis not present

## 2017-04-01 DIAGNOSIS — J441 Chronic obstructive pulmonary disease with (acute) exacerbation: Secondary | ICD-10-CM

## 2017-04-01 DIAGNOSIS — J9611 Chronic respiratory failure with hypoxia: Secondary | ICD-10-CM | POA: Diagnosis not present

## 2017-04-01 MED ORDER — FLUTICASONE-UMECLIDIN-VILANT 100-62.5-25 MCG/INH IN AEPB: 1.0000 | INHALATION_SPRAY | Freq: Every day | RESPIRATORY_TRACT | 0 refills | Status: DC

## 2017-04-01 MED ORDER — ALBUTEROL SULFATE (2.5 MG/3ML) 0.083% IN NEBU: INHALATION_SOLUTION | RESPIRATORY_TRACT | 12 refills | Status: AC

## 2017-04-01 NOTE — Assessment & Plan Note (Signed)
Patient is encouraged on oxygen compliance.  He will continue on oxygen 2 L.  [facility] to help with portable oxygen tank issues

## 2017-04-01 NOTE — Assessment & Plan Note (Signed)
Continue follow-up with oncology 

## 2017-04-01 NOTE — Patient Instructions (Addendum)
Stop by Spiriva  and Dulera and Combivent .  Begin TRELEGY 1 puff daily.  Rinse after use Please wear her oxygen 3 L at all times.  We have contacted [facility] to help with your portable tank issues Work on not smoking May use albuterol nebulizer every 4-6 hours as needed for wheezing/shortness of breath .  Follow up with Dr. Elsworth Soho  In 6 weeks and As needed   Please contact office for sooner follow up if symptoms do not improve or worsen or seek emergency care

## 2017-04-01 NOTE — Addendum Note (Signed)
Addended by: Amado Coe on: 04/01/2017 12:23 PM   Modules accepted: Orders

## 2017-04-01 NOTE — Assessment & Plan Note (Signed)
Recent COPD exacerbation now slowly resolving.  Patient continues to have significant symptom load.  Will change to TRELEGY .  Add albuterol nebulizer as needed  Plan  Patient Instructions  Stop by Spiriva  and Dulera and Combivent .  Begin TRELEGY 1 puff daily.  Rinse after use Please wear her oxygen 3 L at all times.  We have contacted [facility] to help with your portable tank issues Work on not smoking May use albuterol nebulizer every 4-6 hours as needed for wheezing/shortness of breath .  Follow up with Dr. Elsworth Soho  In 6 weeks and As needed   Please contact office for sooner follow up if symptoms do not improve or worsen or seek emergency care

## 2017-04-01 NOTE — Progress Notes (Signed)
@Patient  ID: [name], male    DOB: 1960/07/23, 57 y.o.   MRN: [id number]  Chief Complaint  Patient presents with  . Follow-up    COPD     Referring provider: No ref. provider found  HPI: 57 year old male, smoker, followed for COPD and metastatic lung cancer diagnosed with Stage IV (T3, N3, M1c)  adenocarcinoma in February 2018 with negative  mutations, with PDL1 expression 15-20 %. He completed Palliative radiotherapy to  T12 and L3 completed in 05/2016 . On Chemo   TEST  Spirometry July 2018> FEV1 33%, ratio 59, FVC 44% CT chest March 14, 2017, negative for PE, no change in small to moderate right pleural effusion, no change in spiculated left upper lobe density, severe emphysema  04/01/2017 Follow up ; COPD , O2 RF , Lung Cancer  Patient presents for a post hospital follow-up.  Patient was recently admitted last month for a COPD exacerbation.  Patient has underlying severe COPD he was treated with antibiotics, steroids and nebulized bronchodilators.  Patient had persistent hypoxemia and was started on oxygen 2-3 L continuously.  Patient does continue to smoke.  Smoking cessation was discussed.  He was discharged to a rehab /SNF.  Since discharge patient says he continues to have shortness of breath with activity.  Wears out easily.  Denies increased cough or congestion.  Patient is currently on Dulera 100, Spiriva, Combivent.  She is on oxygen but says his portable oxygen tanks are not working.  On arrival today O2 saturations were 79%.  Patient was placed on oxygen at 2 L with O2 saturations at 96%.  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  He is followed with oncology and is currently on chemotherapy.  That it makes him very weak.  Denies vomiting.   No Known Allergies  Immunization History  Administered Date(s) Administered  . Influenza Whole 03/03/2017  . PPD Test 01/07/2017, 02/13/2017  . Pneumococcal Polysaccharide-23 05/29/2011    Past  Medical History:  Diagnosis Date  . Abdominal pain 06/04/2016  . Adenocarcinoma of left lung, stage 4 ([place]) 05/02/2016  . Bronchitis due to tobacco use ([place])   . Cancer ([place])    Lung  . Dehydration 06/04/2016  . Encounter for antineoplastic chemotherapy 05/02/2016  . Goals of care, counseling/discussion 05/02/2016  . HTN (hypertension) 10/30/2016  . Recurrent upper respiratory infection (URI)   . Seizures ([place]) 05/2011   new onset  . Shortness of breath     Tobacco History: Social History   Tobacco Use  Smoking Status Current Some Day Smoker  . Packs/day: 0.10  . Years: 30.00  . Pack years: 3.00  . Types: Cigarettes  Smokeless Tobacco Never Used  Tobacco Comment   1-2 cig a day    Ready to quit: Not Answered Counseling given: Not Answered Comment: 1-2 cig a day    Outpatient Encounter Medications as of 04/01/2017  Medication Sig  . acetaminophen (TYLENOL) 500 MG tablet Take 500 mg by mouth 2 (two) times daily as needed for mild pain or headache.  Marland Kitchen amLODipine (NORVASC) 10 MG tablet Take 10 mg by mouth daily.  . bisoprolol (ZEBETA) 5 MG tablet Take 1 tablet (5 mg total) by mouth daily.  . Chlorphen-Phenyleph-ASA (ALKA-SELTZER PLUS COLD PO) Take 2 tablets by mouth at bedtime as needed (COUGH).  Marland Kitchen dexamethasone (DECADRON) 4 MG tablet Take 4 mg by mouth as directed. Take 1 tablet twice daily the day before, the day of and the day  after chemotherapy every 3 weeks.  . feeding supplement, ENSURE ENLIVE, (ENSURE ENLIVE) LIQD Take 237 mLs by mouth 3 (three) times daily between meals.  . fluconazole (DIFLUCAN) 100 MG tablet Take 1 tablet (100 mg total) by mouth daily.  Marland Kitchen guaiFENesin (MUCINEX) 600 MG 12 hr tablet Take 1 tablet (600 mg total) by mouth 2 (two) times daily.  . Ipratropium-Albuterol (COMBIVENT) 20-100 MCG/ACT AERS respimat Inhale 1 puff into the lungs every 6 (six) hours.  . lidocaine-prilocaine (EMLA) cream Apply 1 application topically as needed.  . loratadine  (CLARITIN) 10 MG tablet Take 10 mg by mouth daily.  . mometasone-formoterol (DULERA) 100-5 MCG/ACT AERO Inhale 2 puffs into the lungs 2 (two) times daily.  . ondansetron (ZOFRAN-ODT) 4 MG disintegrating tablet Take 4 mg by mouth every 8 (eight) hours as needed for nausea or vomiting.  . pantoprazole (PROTONIX) 40 MG tablet Take 1 tablet (40 mg total) by mouth 2 (two) times daily.  . polyethylene glycol (MIRALAX / GLYCOLAX) packet Take 17 g by mouth daily as needed for mild constipation.  . senna-docusate (SENNA-PLUS) 8.6-50 MG tablet Take 1 tablet by mouth at bedtime.  Marland Kitchen tiotropium (SPIRIVA) 18 MCG inhalation capsule Place 1 capsule (18 mcg total) into inhaler and inhale daily.  . furosemide (LASIX) 40 MG tablet Take 1 tablet (40 mg total) by mouth every three (3) days as needed for fluid or edema.  . Oxycodone HCl 10 MG TABS Take 1 tablet (10 mg total) by mouth every 4 (four) hours as needed (or shortness of breath).  . [DISCONTINUED] predniSONE (DELTASONE) 10 MG tablet 4 pills daily for 2 days then 2 pill daily for 3 days then single  pill daily for 3 days then stop (Patient not taking: Reported on 04/01/2017)   No facility-administered encounter medications on file as of 04/01/2017.      Review of Systems  Constitutional:   No  weight loss, night sweats,  Fevers, chills, +atigue, or  lassitude.  HEENT:   No headaches,  Difficulty swallowing,  Tooth/dental problems, or  Sore throat,                No sneezing, itching, ear ache, nasal congestion, post nasal drip,   CV:  No chest pain,  Orthopnea, PND, swelling in lower extremities, anasarca, dizziness, palpitations, syncope.   GI  No heartburn, indigestion, abdominal pain, nausea, vomiting, diarrhea, change in bowel habits, loss of appetite, bloody stools.   Resp:    No chest wall deformity  Skin: no rash or lesions.  GU: no dysuria, change in color of urine, no urgency or frequency.  No flank pain, no hematuria   MS:  No joint pain  or swelling.  No decreased range of motion.  No back pain.    Physical Exam  BP 118/68   Pulse (!) 118   Ht 5\' 11"  (1.803 m)   Wt 127 lb (57.6 kg)   SpO2 97%   BMI 17.71 kg/m   GEN: A/Ox3; pleasant , NAD, thin, frail on oxygen   HEENT:  [place]/AT,  EACs-clear, TMs-wnl, NOSE-clear, THROAT-clear, no lesions, no postnasal drip or exudate noted.   NECK:  Supple w/ fair ROM; no JVD; normal carotid impulses w/o bruits; no thyromegaly or nodules palpated; no lymphadenopathy.    RESP diminished breath sounds in the bases  w/o, wheezes/ rales/ or rhonchi. no accessory muscle use, no dullness to percussion  CARD:  RRR, no m/r/g, tr peripheral edema, pulses intact, no cyanosis or clubbing.  GI:   Soft & nt; nml bowel sounds; no organomegaly or masses detected.   Musco: Warm bil, no deformities or joint swelling noted.   Neuro: alert, no focal deficits noted.    Skin: Warm, no lesions or rashes    Lab Results:  CBC    Component Value Date/Time   WBC 14.2 (H) 03/25/2017 1215   RBC 3.32 (L) 03/25/2017 1215   HGB 11.1 (L) 03/25/2017 1215   HGB 9.3 (L) 01/23/2017 1028   HCT 33.9 (L) 03/25/2017 1215   HCT 27.5 (L) 01/23/2017 1028   PLT 163 03/25/2017 1215   PLT 170 01/23/2017 1028   MCV 102.2 (H) 03/25/2017 1215   MCV 106.0 (H) 01/23/2017 1028   MCH 33.4 03/25/2017 1215   MCHC 32.7 03/25/2017 1215   RDW 18.4 (H) 03/25/2017 1215   RDW 19.2 (H) 01/23/2017 1028   LYMPHSABS 0.1 (L) 03/25/2017 1215   LYMPHSABS 0.1 (L) 01/23/2017 1028   MONOABS 0.4 03/25/2017 1215   MONOABS 0.1 01/23/2017 1028   EOSABS 0.0 03/25/2017 1215   EOSABS 0.0 01/23/2017 1028   BASOSABS 0.1 03/25/2017 1215   BASOSABS 0.0 01/23/2017 1028    BMET    Component Value Date/Time   NA 139 03/25/2017 1215   NA 135 (L) 01/23/2017 1028   K 4.4 03/25/2017 1215   K 4.6 01/23/2017 1028   CL 100 03/25/2017 1215   CO2 29 03/25/2017 1215   CO2 26 01/23/2017 1028   GLUCOSE 85 03/25/2017 1215   GLUCOSE 102  01/23/2017 1028   [name] 24 03/25/2017 1215   [name] 23.0 01/23/2017 1028   CREATININE 1.48 (H) 03/25/2017 1215   CREATININE 1.6 (H) 01/23/2017 1028   CALCIUM 9.9 03/25/2017 1215   CALCIUM 9.6 01/23/2017 1028   GFRNONAA 51 (L) 03/25/2017 1215   GFRAA 59 (L) 03/25/2017 1215    BNP    Component Value Date/Time   BNP 28.6 03/09/2017 1719    ProBNP No results found for: PROBNP  Imaging: Ct Angio Chest Pe W Or Wo Contrast  Result Date: 03/13/2017 CLINICAL DATA:  Increasing cough, shortness of breath and respiratory distress in a patient with a history of COPD and metastatic non-small cell lung carcinoma. EXAM: CT ANGIOGRAPHY CHEST WITH CONTRAST TECHNIQUE: Multidetector CT imaging of the chest was performed using the standard protocol during bolus administration of intravenous contrast. Multiplanar CT image reconstructions and MIPs were obtained to evaluate the vascular anatomy. CONTRAST:  100 ml ISOVUE-370 IOPAMIDOL (ISOVUE-370) INJECTION 76% COMPARISON:  CT chest 03/01/2017 and 12/31/2017. Single-view of the chest 03/09/2017. FINDINGS: Cardiovascular: No pulmonary embolus is identified. Heart size is normal. No aortic aneurysm. No pericardial effusion. Mediastinum/Nodes: Thyroid gland appears normal. No lymphadenopathy. Mild thickening of the walls of the is esophagus and changed. Lungs/Pleura: Small to moderate right pleural effusion is unchanged since the most recent study. No left pleural effusion. Lungs demonstrate severe emphysematous disease. Spiculated left upper lobe nodule measures 2.9 x 1.5 cm on axial image 43 of series 6, unchanged since the most recent exam. Upper Abdomen: As seen on prior examinations but more conspicuous today is fluid adjacent to the celiac axis and posterior to the left hepatic lobe measuring approximately 3.9 cm AP x 3.7 cm transverse. Fluid measures at least 5 cm craniocaudal but is incompletely imaged on this study. 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  Musculoskeletal: Skeletal metastases appear unchanged. No acute bony abnormality. Review of the MIP images confirms the above findings. IMPRESSION: Negative for pulmonary embolus or acute  cardiopulmonary disease. No change in a small to moderate right pleural effusion. No change in a spiculated left upper lobe density compatible with the patient's history of lung carcinoma. Fluid along the celiac axis in the upper abdomen is incompletely imaged. It has been present on prior exams but has increased in size. It may represent loculated ascites in this patient with a cirrhotic liver. If indicated, CT abdomen and pelvis with contrast could be used for further evaluation. Severe emphysema (ICD10-J43.9). Electronically Signed   By: Inge Rise M.D.   On: 03/13/2017 16:16   Dg Chest Port 1 View  Result Date: 03/17/2017 CLINICAL DATA:  57 year old male with increasing shortness of breath. Chronic lung disease and history of lung cancer. EXAM: PORTABLE CHEST 1 VIEW COMPARISON:  Chest CTA 03/13/2017 and earlier. FINDINGS: Portable AP semi upright view at 1133 hrs. Stable lung volumes. Stable cardiac size and mediastinal contours. Stable right chest porta cath. Left lung bullous emphysema and architectural distortion is stable. No pneumothorax or pulmonary edema identified. Small right pleural effusion appears stable. Paucity of bowel gas in the upper abdomen. IMPRESSION: Stable radiographic appearance of the chest since 01/06/2018 with small right pleural effusion superimposed on Emphysema (ICD10-J43.9). No new acute cardiopulmonary abnormality. Electronically Signed   By: Genevie Ann M.D.   On: 03/17/2017 12:22   Dg Chest Port 1 View  Result Date: 03/09/2017 CLINICAL DATA:  Severe dyspnea. EXAM: PORTABLE CHEST 1 VIEW COMPARISON:  March 01, 2017 FINDINGS: No pneumothorax. Stable right Port-A-Cath. The cardiomediastinal silhouette is unremarkable. Hyperinflation of the lungs. Scarring in the lateral left lung base.  No nodules or masses. Emphysematous changes in the lungs. IMPRESSION: Emphysematous changes.  No acute abnormalities. Electronically Signed   By: Dorise Bullion III M.D   On: 03/09/2017 17:46     Assessment & Plan:   Chronic obstructive pulmonary disease ([place]) Recent COPD exacerbation now slowly resolving.  Patient continues to have significant symptom load.  Will change to TRELEGY .  Add albuterol nebulizer as needed  Plan  Patient Instructions  Stop by Spiriva  and Dulera and Combivent .  Begin TRELEGY 1 puff daily.  Rinse after use Please wear her oxygen 3 L at all times.  We have contacted [facility] to help with your portable tank issues Work on not smoking May use albuterol nebulizer every 4-6 hours as needed for wheezing/shortness of breath .  Follow up with Dr. Elsworth Soho  In 6 weeks and As needed   Please contact office for sooner follow up if symptoms do not improve or worsen or seek emergency care        Adenocarcinoma of left lung, stage 4 [facility]) Continue follow-up with oncology  Chronic respiratory failure with hypoxia [facility]) Patient is encouraged on oxygen compliance.  He will continue on oxygen 2 L.  [facility] contacted to help with portable oxygen tank issues     Rexene Edison, NP 04/01/2017

## 2017-04-02 NOTE — Addendum Note (Signed)
Addended by: Parke Poisson E on: 04/02/2017 12:57 PM   Modules accepted: Orders

## 2017-04-07 ENCOUNTER — Emergency Department (HOSPITAL_COMMUNITY)
Admission: EM | Admit: 2017-04-07 | Discharge: 2017-04-07 | Disposition: A | Discharge status: Home / Self Care | Payer: Medicaid Other | Attending: Emergency Medicine | Admitting: Emergency Medicine

## 2017-04-07 ENCOUNTER — Encounter (HOSPITAL_COMMUNITY): Payer: Self-pay | Admitting: Family Medicine

## 2017-04-07 DIAGNOSIS — G893 Neoplasm related pain (acute) (chronic): Secondary | ICD-10-CM | POA: Diagnosis not present

## 2017-04-07 DIAGNOSIS — N183 Chronic kidney disease, stage 3 (moderate): Secondary | ICD-10-CM | POA: Diagnosis not present

## 2017-04-07 DIAGNOSIS — Z9221 Personal history of antineoplastic chemotherapy: Secondary | ICD-10-CM | POA: Diagnosis not present

## 2017-04-07 DIAGNOSIS — Z79899 Other long term (current) drug therapy: Secondary | ICD-10-CM | POA: Diagnosis not present

## 2017-04-07 DIAGNOSIS — E860 Dehydration: Secondary | ICD-10-CM | POA: Diagnosis not present

## 2017-04-07 DIAGNOSIS — M549 Dorsalgia, unspecified: Secondary | ICD-10-CM | POA: Diagnosis present

## 2017-04-07 DIAGNOSIS — F17210 Nicotine dependence, cigarettes, uncomplicated: Secondary | ICD-10-CM | POA: Diagnosis not present

## 2017-04-07 DIAGNOSIS — I129 Hypertensive chronic kidney disease with stage 1 through stage 4 chronic kidney disease, or unspecified chronic kidney disease: Secondary | ICD-10-CM | POA: Insufficient documentation

## 2017-04-07 DIAGNOSIS — J449 Chronic obstructive pulmonary disease, unspecified: Secondary | ICD-10-CM | POA: Diagnosis not present

## 2017-04-07 DIAGNOSIS — Z85118 Personal history of other malignant neoplasm of bronchus and lung: Secondary | ICD-10-CM | POA: Diagnosis not present

## 2017-04-07 LAB — CBC WITH DIFFERENTIAL/PLATELET
BASOS ABS: 0 10*3/uL (ref 0.0–0.1)
BASOS PCT: 0 %
EOS PCT: 0 %
Eosinophils Absolute: 0 10*3/uL (ref 0.0–0.7)
HEMATOCRIT: 24.4 % — AB (ref 39.0–52.0)
Hemoglobin: 8.1 g/dL — ABNORMAL LOW (ref 13.0–17.0)
LYMPHS PCT: 9 %
Lymphs Abs: 0.4 10*3/uL — ABNORMAL LOW (ref 0.7–4.0)
MCH: 33.5 pg (ref 26.0–34.0)
MCHC: 33.2 g/dL (ref 30.0–36.0)
MCV: 100.8 fL — ABNORMAL HIGH (ref 78.0–100.0)
Monocytes Absolute: 0.4 10*3/uL (ref 0.1–1.0)
Monocytes Relative: 8 %
NEUTROS ABS: 4.2 10*3/uL (ref 1.7–7.7)
Neutrophils Relative %: 83 %
PLATELETS: 31 10*3/uL — AB (ref 150–400)
RBC: 2.42 MIL/uL — AB (ref 4.22–5.81)
RDW: 14.6 % (ref 11.5–15.5)
WBC: 5.1 10*3/uL (ref 4.0–10.5)

## 2017-04-07 LAB — COMPREHENSIVE METABOLIC PANEL
ALK PHOS: 109 U/L (ref 38–126)
ALT: 19 U/L (ref 17–63)
ANION GAP: 10 (ref 5–15)
AST: 29 U/L (ref 15–41)
Albumin: 2.6 g/dL — ABNORMAL LOW (ref 3.5–5.0)
BILIRUBIN TOTAL: 0.5 mg/dL (ref 0.3–1.2)
BUN: 22 mg/dL — ABNORMAL HIGH (ref 6–20)
CALCIUM: 8.8 mg/dL — AB (ref 8.9–10.3)
CO2: 32 mmol/L (ref 22–32)
Chloride: 94 mmol/L — ABNORMAL LOW (ref 101–111)
Creatinine, Ser: 2.1 mg/dL — ABNORMAL HIGH (ref 0.61–1.24)
GFR calc Af Amer: 39 mL/min — ABNORMAL LOW (ref >60)
GFR, EST NON AFRICAN AMERICAN: 33 mL/min — AB (ref >60)
Glucose, Bld: 92 mg/dL (ref 65–99)
POTASSIUM: 3.7 mmol/L (ref 3.5–5.1)
Sodium: 136 mmol/L (ref 135–145)
TOTAL PROTEIN: 6.7 g/dL (ref 6.5–8.1)

## 2017-04-07 MED ORDER — SODIUM CHLORIDE 0.9 % IV BOLUS (SEPSIS)
500.0000 mL | Freq: Once | INTRAVENOUS | Status: AC
  Administered 2017-04-07: 500 mL via INTRAVENOUS

## 2017-04-07 MED ORDER — MORPHINE SULFATE (PF) 4 MG/ML IV SOLN
4.0000 mg | Freq: Once | INTRAVENOUS | Status: AC
Start: 2017-04-07 — End: 2017-04-07
  Administered 2017-04-07: 4 mg via INTRAVENOUS
  Filled 2017-04-07: qty 1

## 2017-04-07 MED ORDER — MORPHINE SULFATE (PF) 4 MG/ML IV SOLN
4.0000 mg | Freq: Once | INTRAVENOUS | Status: AC
  Administered 2017-04-07: 4 mg via INTRAVENOUS
  Filled 2017-04-07: qty 1

## 2017-04-07 NOTE — ED Provider Notes (Signed)
[facility] DEPT Provider Note   CSN: [id number] Arrival date & time: 04/07/17  1853     History   Chief Complaint Chief Complaint  Patient presents with  . Generalized Pain     HPI [name] is a 57 y.o. male.  57 year old male with prior history of adenoma of the lung, COPD, hypertension, and seizures presents with complaint of pain.  Patient reports that he has been on morphine until recently.  This was recently changed oxycodone.  He reports that he has been out of his oxycodone for the last several days.  His pain medications are typically prescribed by [name].  Tonight he presents complaining of diffuse pain all over especially in his back.  This is consistent with his typical pain related to his advanced cancer.  He specifically denies chest pain or shortness of breath. He denies fever or other acute complaint.    The history is provided by the patient.  Illness  This is a chronic problem. The current episode started more than 2 days ago. The problem occurs constantly. The problem has not changed since onset.Pertinent negatives include no chest pain, no abdominal pain, no headaches and no shortness of breath. Nothing aggravates the symptoms. Nothing relieves the symptoms. He has tried acetaminophen for the symptoms.    Past Medical History:  Diagnosis Date  . Abdominal pain 06/04/2016  . Adenocarcinoma of left lung, stage 4 ([place]) 05/02/2016  . Bronchitis due to tobacco use ([place])   . Cancer ([place])    Lung  . Dehydration 06/04/2016  . Encounter for antineoplastic chemotherapy 05/02/2016  . Goals of care, counseling/discussion 05/02/2016  . HTN (hypertension) 10/30/2016  . Recurrent upper respiratory infection (URI)   . Seizures ([place]) 05/2011   new onset  . Shortness of breath     Patient Active Problem List   Diagnosis Date Noted  . Chronic respiratory failure with hypoxia ([place]) 04/01/2017  . Leucocytosis 03/16/2017  . CKD  (chronic kidney disease), stage III ([place]) 03/12/2017  . Malnutrition of moderate degree 03/10/2017  . COPD with acute exacerbation ([place]) 03/09/2017  . Anemia 02/07/2017  . Severe malnutrition ([place]) 01/03/2017  . DOE (dyspnea on exertion) 12/30/2016  . Nausea   . Hypervolemia   . HCAP (healthcare-associated pneumonia) 12/19/2016  . Dyspnea and respiratory abnormality 12/19/2016  . Erosive gastropathy 12/18/2016  . Gastritis 12/18/2016  . Hematemesis 12/16/2016  . Intractable nausea and vomiting 12/16/2016  . HTN (hypertension) 10/30/2016  . Chronic obstructive pulmonary disease ([place]) 09/02/2016  . COPD (chronic obstructive pulmonary disease) ([place]) 08/19/2016  . Dehydration 06/04/2016  . Abdominal pain 06/04/2016  . 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 Adenocarcinoma of left lung, stage 4 ([place]) 05/02/2016  . Goals of care, counseling/discussion 05/02/2016  . Encounter for antineoplastic chemotherapy 05/02/2016  . Tobacco abuse 05/30/2011  . Alcohol abuse 05/30/2011  . Seizure ([place]) 05/28/2011    Past Surgical History:  Procedure Laterality Date  . ESOPHAGOGASTRODUODENOSCOPY (EGD) WITH PROPOFOL N/A 12/17/2016   Procedure: ESOPHAGOGASTRODUODENOSCOPY (EGD) WITH PROPOFOL;  Surgeon: Ladene Artist, MD;  Location: [facility] ENDOSCOPY;  Service: Endoscopy;  Laterality: N/A;  . IR FLUORO GUIDE PORT INSERTION RIGHT  05/13/2016  . IR US GUIDE VASC ACCESS RIGHT  05/13/2016  . NO PAST SURGERIES         Home Medications    Prior to Admission medications   Medication Sig Start Date End Date Taking? Authorizing Provider  bisoprolol (ZEBETA) 5 MG tablet Take 1 tablet (  5 mg total) by mouth daily. 02/16/17  Yes Florencia Reasons, MD  feeding supplement, ENSURE ENLIVE, (ENSURE ENLIVE) LIQD Take 237 mLs by mouth 3 (three) times daily between meals. 03/19/17  Yes Marene Lenz, MD  furosemide (LASIX) 40 MG tablet Take 1 tablet (40 mg total) by mouth every three (3) days as needed for fluid or edema.  02/16/17 04/07/17 Yes Florencia Reasons, MD  guaiFENesin (MUCINEX) 600 MG 12 hr tablet Take 1 tablet (600 mg total) by mouth 2 (two) times daily. 03/19/17  Yes Adhikari Emeline General, MD  mometasone-formoterol (DULERA) 100-5 MCG/ACT AERO Inhale 2 puffs into the lungs 2 (two) times daily. 03/19/17  Yes Adhikari Emeline General, MD  Oxycodone HCl 10 MG TABS Take 1 tablet (10 mg total) by mouth every 4 (four) hours as needed (or shortness of breath). 03/19/17  Yes Marene Lenz, MD  polyethylene glycol (MIRALAX / GLYCOLAX) packet Take 17 g by mouth daily as needed for mild constipation. 03/19/17  Yes Marene Lenz, MD  tiotropium (SPIRIVA) 18 MCG inhalation capsule Place 1 capsule (18 mcg total) into inhaler and inhale daily. 03/20/17  Yes Marene Lenz, MD  acetaminophen (TYLENOL) 500 MG tablet Take 500 mg by mouth 2 (two) times daily as needed for mild pain or headache.    [provider]  albuterol (PROVENTIL) (2.5 MG/3ML) 0.083% nebulizer solution 1 neb every 4-6 hours as needed for wheezing and shortness of breath Patient not taking: Reported on 04/07/2017 04/01/17   Parrett, Fonnie Mu, NP  amLODipine (NORVASC) 10 MG tablet Take 10 mg by mouth daily.    [provider]  Chlorphen-Phenyleph-ASA (ALKA-SELTZER PLUS COLD PO) Take 2 tablets by mouth at bedtime as needed (COUGH).    [provider]  dexamethasone (DECADRON) 4 MG tablet Take 4 mg by mouth as directed. Take 1 tablet twice daily the day before, the day of and the day after chemotherapy every 3 weeks.    [provider]  fluconazole (DIFLUCAN) 100 MG tablet Take 1 tablet (100 mg total) by mouth daily. Patient not taking: Reported on 04/07/2017 03/20/17   Marene Lenz, MD  Fluticasone-Umeclidin-Vilant (TRELEGY ELLIPTA) 100-62.5-25 MCG/INH AEPB Inhale 1 puff into the lungs daily. Patient not taking: Reported on 04/07/2017 04/01/17   Parrett, Fonnie Mu, NP  Ipratropium-Albuterol (COMBIVENT) 20-100 MCG/ACT AERS respimat  Inhale 1 puff into the lungs every 6 (six) hours. Patient not taking: Reported on 04/07/2017 07/22/16   Curt Bears, MD  lidocaine-prilocaine (EMLA) cream Apply 1 application topically as needed.    [provider]  loratadine (CLARITIN) 10 MG tablet Take 10 mg by mouth daily.    [provider]  ondansetron (ZOFRAN-ODT) 4 MG disintegrating tablet Take 4 mg by mouth every 8 (eight) hours as needed for nausea or vomiting.    [provider]  pantoprazole (PROTONIX) 40 MG tablet Take 1 tablet (40 mg total) by mouth 2 (two) times daily. 12/18/16   Eugenie Filler, MD  senna-docusate (SENNA-PLUS) 8.6-50 MG tablet Take 1 tablet by mouth at bedtime.    [provider]    Family History Family History  Problem Relation Age of Onset  . Cancer Father   . Diabetes Mellitus II Mother     Social History Social History   Tobacco Use  . Smoking status: Current Some Day Smoker    Packs/day: 0.10    Years: 30.00    Pack years: 3.00    Types: Cigarettes  . Smokeless tobacco:  Never Used  . Tobacco comment: 1-2 cig a day   Substance Use Topics  . Alcohol use: No    Frequency: Never  . Drug use: No     Allergies   Patient has no known allergies.   Review of Systems Review of Systems  Constitutional:       Diffuse pain  Respiratory: Negative for shortness of breath.   Cardiovascular: Negative for chest pain.  Gastrointestinal: Negative for abdominal pain.  Neurological: Negative for headaches.  All other systems reviewed and are negative.    Physical Exam Updated Vital Signs BP 131/81   Pulse 99   Temp 99 F (37.2 C) (Oral)   Resp 19   SpO2 100%   Physical Exam  Constitutional: He is oriented to person, place, and time. He appears well-developed and well-nourished. No distress.  HENT:  Head: Normocephalic and atraumatic.  Mouth/Throat: Oropharynx is clear and moist.  Eyes: Conjunctivae and EOM are normal. Pupils are equal, round, and  reactive to light.  Neck: Normal range of motion. Neck supple.  Cardiovascular: Normal rate, regular rhythm and normal heart sounds.  Pulmonary/Chest: Effort normal and breath sounds normal. No respiratory distress.  Abdominal: Soft. He exhibits no distension. There is no tenderness.  Musculoskeletal: Normal range of motion. He exhibits no edema or deformity.  Neurological: He is alert and oriented to person, place, and time.  Skin: Skin is warm and dry.  Psychiatric: He has a normal mood and affect.  Nursing note and vitals reviewed.    ED Treatments / Results  Labs (all labs ordered are listed, but only abnormal results are displayed) Labs Reviewed  COMPREHENSIVE METABOLIC PANEL - Abnormal; Notable for the following components:      Result Value   Chloride 94 (*)    BUN 22 (*)    Creatinine, Ser 2.10 (*)    Calcium 8.8 (*)    Albumin 2.6 (*)    GFR calc non Af Amer 33 (*)    GFR calc Af Amer 39 (*)    All other components within normal limits  CBC WITH DIFFERENTIAL/PLATELET - Abnormal; Notable for the following components:   RBC 2.42 (*)    Hemoglobin 8.1 (*)    HCT 24.4 (*)    MCV 100.8 (*)    Platelets 31 (*)    Lymphs Abs 0.4 (*)    All other components within normal limits    EKG  EKG Interpretation None       Radiology No results found.  Procedures Procedures (including critical care time)  Medications Ordered in ED Medications  sodium chloride 0.9 % bolus 500 mL (not administered)  morphine 4 MG/ML injection 4 mg (not administered)     Initial Impression / Assessment and Plan / ED Course  I have reviewed the triage vital signs and the nursing notes.  Pertinent labs & imaging results that were available during my care of the patient were reviewed by me and considered in my medical decision making (see chart for details).     MDM  Screen complete  Patient is presenting with chronic pain secondary to his cancer. He feels improved following  administration of pain medication in the ED. Screening labs suggest mild dehydration- treated with IVF and chronic anemia. Patient feels improved following his ED evaluation and desires DC home. He will contact his oncologist ([name]) tomorrow for refill of his routinely prescribed narcotics. He declines further treatment in the ED or admission for pain management.  Final Clinical Impressions(s) / ED Diagnoses   Final diagnoses:  Chronic pain due to neoplasm    ED Discharge Orders    None       Valarie Merino, MD 04/07/17 2151

## 2017-04-07 NOTE — ED Notes (Signed)
Notified [facility] for transportation back to [facility] in [place]. Attempted to call [facility] at [place], unable to reach a staff member over the phone.

## 2017-04-07 NOTE — ED Triage Notes (Signed)
Patient is for [facility] and transported via [facility]. Patient has lung cancer and usually takes Oxycodone. Patient took all his pain medication and the facility has been taking Tylenol 500mg  for the last two days. Patient is complaining of generalized pain but mainly in the back. Patients last chemo was two weeks ago.

## 2017-04-08 ENCOUNTER — Other Ambulatory Visit: Payer: Self-pay | Admitting: Medical Oncology

## 2017-04-08 ENCOUNTER — Telehealth: Payer: Self-pay | Admitting: Medical Oncology

## 2017-04-08 DIAGNOSIS — C3492 Malignant neoplasm of unspecified part of left bronchus or lung: Secondary | ICD-10-CM

## 2017-04-08 MED ORDER — OXYCODONE HCL 10 MG PO TABS: 10.0000 mg | ORAL_TABLET | ORAL | 0 refills | Status: DC | PRN

## 2017-04-08 NOTE — Telephone Encounter (Signed)
Refill requested and ready for pick up.

## 2017-04-10 ENCOUNTER — Encounter (HOSPITAL_COMMUNITY): Payer: Self-pay

## 2017-04-10 ENCOUNTER — Telehealth: Payer: Self-pay | Admitting: Adult Health

## 2017-04-10 ENCOUNTER — Inpatient Hospital Stay (HOSPITAL_COMMUNITY)
Admission: EM | Admit: 2017-04-10 | Discharge: 2017-04-17 | DRG: 193 | Disposition: A | Discharge status: Home Health | Payer: Medicaid Other | Attending: Nephrology | Admitting: Nephrology

## 2017-04-10 ENCOUNTER — Emergency Department (HOSPITAL_COMMUNITY): Payer: Medicaid Other

## 2017-04-10 ENCOUNTER — Other Ambulatory Visit: Payer: Self-pay

## 2017-04-10 DIAGNOSIS — Z79899 Other long term (current) drug therapy: Secondary | ICD-10-CM | POA: Diagnosis not present

## 2017-04-10 DIAGNOSIS — R05 Cough: Secondary | ICD-10-CM

## 2017-04-10 DIAGNOSIS — I129 Hypertensive chronic kidney disease with stage 1 through stage 4 chronic kidney disease, or unspecified chronic kidney disease: Secondary | ICD-10-CM | POA: Diagnosis present

## 2017-04-10 DIAGNOSIS — J9611 Chronic respiratory failure with hypoxia: Secondary | ICD-10-CM | POA: Diagnosis present

## 2017-04-10 DIAGNOSIS — J440 Chronic obstructive pulmonary disease with acute lower respiratory infection: Secondary | ICD-10-CM | POA: Diagnosis present

## 2017-04-10 DIAGNOSIS — R569 Unspecified convulsions: Secondary | ICD-10-CM | POA: Diagnosis present

## 2017-04-10 DIAGNOSIS — W19XXXA Unspecified fall, initial encounter: Secondary | ICD-10-CM | POA: Diagnosis not present

## 2017-04-10 DIAGNOSIS — G893 Neoplasm related pain (acute) (chronic): Secondary | ICD-10-CM | POA: Diagnosis present

## 2017-04-10 DIAGNOSIS — N179 Acute kidney failure, unspecified: Secondary | ICD-10-CM | POA: Diagnosis present

## 2017-04-10 DIAGNOSIS — Z9981 Dependence on supplemental oxygen: Secondary | ICD-10-CM | POA: Diagnosis not present

## 2017-04-10 DIAGNOSIS — N183 Chronic kidney disease, stage 3 unspecified: Secondary | ICD-10-CM | POA: Diagnosis present

## 2017-04-10 DIAGNOSIS — D696 Thrombocytopenia, unspecified: Secondary | ICD-10-CM | POA: Diagnosis present

## 2017-04-10 DIAGNOSIS — C7951 Secondary malignant neoplasm of bone: Secondary | ICD-10-CM | POA: Diagnosis present

## 2017-04-10 DIAGNOSIS — C3492 Malignant neoplasm of unspecified part of left bronchus or lung: Secondary | ICD-10-CM | POA: Diagnosis present

## 2017-04-10 DIAGNOSIS — J189 Pneumonia, unspecified organism: Principal | ICD-10-CM

## 2017-04-10 DIAGNOSIS — Z923 Personal history of irradiation: Secondary | ICD-10-CM | POA: Diagnosis not present

## 2017-04-10 DIAGNOSIS — J441 Chronic obstructive pulmonary disease with (acute) exacerbation: Secondary | ICD-10-CM | POA: Diagnosis present

## 2017-04-10 DIAGNOSIS — J90 Pleural effusion, not elsewhere classified: Secondary | ICD-10-CM

## 2017-04-10 DIAGNOSIS — J449 Chronic obstructive pulmonary disease, unspecified: Secondary | ICD-10-CM | POA: Diagnosis present

## 2017-04-10 DIAGNOSIS — Z9889 Other specified postprocedural states: Secondary | ICD-10-CM

## 2017-04-10 DIAGNOSIS — D7589 Other specified diseases of blood and blood-forming organs: Secondary | ICD-10-CM | POA: Diagnosis present

## 2017-04-10 DIAGNOSIS — R600 Localized edema: Secondary | ICD-10-CM | POA: Diagnosis not present

## 2017-04-10 DIAGNOSIS — Z833 Family history of diabetes mellitus: Secondary | ICD-10-CM

## 2017-04-10 DIAGNOSIS — Z79891 Long term (current) use of opiate analgesic: Secondary | ICD-10-CM

## 2017-04-10 DIAGNOSIS — Z7952 Long term (current) use of systemic steroids: Secondary | ICD-10-CM | POA: Diagnosis not present

## 2017-04-10 DIAGNOSIS — I10 Essential (primary) hypertension: Secondary | ICD-10-CM | POA: Diagnosis not present

## 2017-04-10 DIAGNOSIS — J9621 Acute and chronic respiratory failure with hypoxia: Secondary | ICD-10-CM | POA: Diagnosis present

## 2017-04-10 DIAGNOSIS — R0902 Hypoxemia: Secondary | ICD-10-CM | POA: Diagnosis not present

## 2017-04-10 DIAGNOSIS — E43 Unspecified severe protein-calorie malnutrition: Secondary | ICD-10-CM | POA: Diagnosis present

## 2017-04-10 DIAGNOSIS — R059 Cough, unspecified: Secondary | ICD-10-CM

## 2017-04-10 DIAGNOSIS — D649 Anemia, unspecified: Secondary | ICD-10-CM | POA: Diagnosis present

## 2017-04-10 DIAGNOSIS — Z681 Body mass index (BMI) 19 or less, adult: Secondary | ICD-10-CM

## 2017-04-10 DIAGNOSIS — F410 Panic disorder [episodic paroxysmal anxiety] without agoraphobia: Secondary | ICD-10-CM | POA: Diagnosis present

## 2017-04-10 DIAGNOSIS — T451X5A Adverse effect of antineoplastic and immunosuppressive drugs, initial encounter: Secondary | ICD-10-CM | POA: Diagnosis present

## 2017-04-10 DIAGNOSIS — Z720 Tobacco use: Secondary | ICD-10-CM | POA: Diagnosis present

## 2017-04-10 DIAGNOSIS — Y95 Nosocomial condition: Secondary | ICD-10-CM | POA: Diagnosis present

## 2017-04-10 DIAGNOSIS — F17210 Nicotine dependence, cigarettes, uncomplicated: Secondary | ICD-10-CM | POA: Diagnosis present

## 2017-04-10 HISTORY — DX: Chronic obstructive pulmonary disease, unspecified: J44.9

## 2017-04-10 HISTORY — DX: Gastritis, unspecified, without bleeding: K29.70

## 2017-04-10 HISTORY — DX: Alcohol abuse, uncomplicated: F10.10

## 2017-04-10 HISTORY — DX: Hematemesis: K92.0

## 2017-04-10 LAB — STREP PNEUMONIAE URINARY ANTIGEN: Strep Pneumo Urinary Antigen: NEGATIVE

## 2017-04-10 LAB — CBC
HCT: 22.9 % — ABNORMAL LOW (ref 39.0–52.0)
Hemoglobin: 7.6 g/dL — ABNORMAL LOW (ref 13.0–17.0)
MCH: 34.2 pg — AB (ref 26.0–34.0)
MCHC: 33.2 g/dL (ref 30.0–36.0)
MCV: 103.2 fL — AB (ref 78.0–100.0)
Platelets: 99 10*3/uL — ABNORMAL LOW (ref 150–400)
RBC: 2.22 MIL/uL — ABNORMAL LOW (ref 4.22–5.81)
RDW: 14.7 % (ref 11.5–15.5)
WBC: 5.8 10*3/uL (ref 4.0–10.5)

## 2017-04-10 LAB — COMPREHENSIVE METABOLIC PANEL
ALT: 14 U/L — ABNORMAL LOW (ref 17–63)
AST: 24 U/L (ref 15–41)
Albumin: 2.6 g/dL — ABNORMAL LOW (ref 3.5–5.0)
Alkaline Phosphatase: 112 U/L (ref 38–126)
Anion gap: 11 (ref 5–15)
BUN: 18 mg/dL (ref 6–20)
CO2: 32 mmol/L (ref 22–32)
CREATININE: 2.01 mg/dL — AB (ref 0.61–1.24)
Calcium: 9.1 mg/dL (ref 8.9–10.3)
Chloride: 94 mmol/L — ABNORMAL LOW (ref 101–111)
GFR, EST AFRICAN AMERICAN: 41 mL/min — AB (ref >60)
GFR, EST NON AFRICAN AMERICAN: 35 mL/min — AB (ref >60)
Glucose, Bld: 117 mg/dL — ABNORMAL HIGH (ref 65–99)
POTASSIUM: 4.1 mmol/L (ref 3.5–5.1)
Sodium: 137 mmol/L (ref 135–145)
Total Bilirubin: 0.5 mg/dL (ref 0.3–1.2)
Total Protein: 6.7 g/dL (ref 6.5–8.1)

## 2017-04-10 LAB — URINALYSIS, ROUTINE W REFLEX MICROSCOPIC
Bilirubin Urine: NEGATIVE
GLUCOSE, UA: NEGATIVE mg/dL
Hgb urine dipstick: NEGATIVE
KETONES UR: NEGATIVE mg/dL
LEUKOCYTES UA: NEGATIVE
Nitrite: NEGATIVE
PROTEIN: NEGATIVE mg/dL
Specific Gravity, Urine: 1.011 (ref 1.005–1.030)
pH: 6 (ref 5.0–8.0)

## 2017-04-10 LAB — CBC WITH DIFFERENTIAL/PLATELET
BASOS PCT: 0 %
Basophils Absolute: 0 10*3/uL (ref 0.0–0.1)
Eosinophils Absolute: 0 10*3/uL (ref 0.0–0.7)
Eosinophils Relative: 0 %
HCT: 24.5 % — ABNORMAL LOW (ref 39.0–52.0)
Hemoglobin: 8.1 g/dL — ABNORMAL LOW (ref 13.0–17.0)
LYMPHS ABS: 0.7 10*3/uL (ref 0.7–4.0)
Lymphocytes Relative: 13 %
MCH: 33.8 pg (ref 26.0–34.0)
MCHC: 33.1 g/dL (ref 30.0–36.0)
MCV: 102.1 fL — AB (ref 78.0–100.0)
MONO ABS: 0.8 10*3/uL (ref 0.1–1.0)
Monocytes Relative: 16 %
Neutro Abs: 3.8 10*3/uL (ref 1.7–7.7)
Neutrophils Relative %: 71 %
PLATELETS: 82 10*3/uL — AB (ref 150–400)
RBC: 2.4 MIL/uL — ABNORMAL LOW (ref 4.22–5.81)
RDW: 14.6 % (ref 11.5–15.5)
WBC: 5.3 10*3/uL (ref 4.0–10.5)

## 2017-04-10 LAB — I-STAT CG4 LACTIC ACID, ED: Lactic Acid, Venous: 0.66 mmol/L (ref 0.5–1.9)

## 2017-04-10 LAB — TROPONIN I

## 2017-04-10 LAB — CREATININE, SERUM
Creatinine, Ser: 1.92 mg/dL — ABNORMAL HIGH (ref 0.61–1.24)
GFR calc non Af Amer: 37 mL/min — ABNORMAL LOW (ref >60)
GFR, EST AFRICAN AMERICAN: 43 mL/min — AB (ref >60)

## 2017-04-10 LAB — BRAIN NATRIURETIC PEPTIDE: B NATRIURETIC PEPTIDE 5: 49.4 pg/mL (ref 0.0–100.0)

## 2017-04-10 MED ORDER — FLUTICASONE-UMECLIDIN-VILANT 100-62.5-25 MCG/INH IN AEPB: 1.0000 | INHALATION_SPRAY | Freq: Every day | RESPIRATORY_TRACT | Status: DC

## 2017-04-10 MED ORDER — FUROSEMIDE 40 MG PO TABS: 40.0000 mg | ORAL_TABLET | ORAL | Status: DC | PRN

## 2017-04-10 MED ORDER — UMECLIDINIUM BROMIDE 62.5 MCG/INH IN AEPB
1.0000 | INHALATION_SPRAY | Freq: Every day | RESPIRATORY_TRACT | Status: DC
  Administered 2017-04-11: 07:00:00 1 via RESPIRATORY_TRACT
  Filled 2017-04-10: qty 7

## 2017-04-10 MED ORDER — FLUTICASONE FUROATE-VILANTEROL 100-25 MCG/INH IN AEPB
1.0000 | INHALATION_SPRAY | Freq: Every day | RESPIRATORY_TRACT | Status: DC
  Administered 2017-04-11: 07:00:00 1 via RESPIRATORY_TRACT
  Filled 2017-04-10: qty 28

## 2017-04-10 MED ORDER — HYDROMORPHONE HCL 1 MG/ML IJ SOLN
1.0000 mg | Freq: Once | INTRAMUSCULAR | Status: AC
  Administered 2017-04-10: 1 mg via INTRAVENOUS
  Filled 2017-04-10: qty 1

## 2017-04-10 MED ORDER — ALUM & MAG HYDROXIDE-SIMETH 200-200-20 MG/5ML PO SUSP
30.0000 mL | ORAL | Status: DC | PRN
  Administered 2017-04-15 – 2017-04-16 (×2): 30 mL via ORAL
  Filled 2017-04-10 (×2): qty 30

## 2017-04-10 MED ORDER — POLYETHYLENE GLYCOL 3350 17 G PO PACK: 17.0000 g | PACK | Freq: Every day | ORAL | Status: DC | PRN

## 2017-04-10 MED ORDER — SALINE SPRAY 0.65 % NA SOLN: 1.0000 | NASAL | Status: DC | PRN

## 2017-04-10 MED ORDER — PHENOL 1.4 % MT LIQD: 1.0000 | OROMUCOSAL | Status: DC | PRN

## 2017-04-10 MED ORDER — PANTOPRAZOLE SODIUM 40 MG PO TBEC
40.0000 mg | DELAYED_RELEASE_TABLET | Freq: Two times a day (BID) | ORAL | Status: DC
  Administered 2017-04-10 – 2017-04-17 (×15): 40 mg via ORAL
  Filled 2017-04-10 (×15): qty 1

## 2017-04-10 MED ORDER — IPRATROPIUM-ALBUTEROL 0.5-2.5 (3) MG/3ML IN SOLN
3.0000 mL | Freq: Three times a day (TID) | RESPIRATORY_TRACT | Status: DC
  Administered 2017-04-11 – 2017-04-12 (×6): 3 mL via RESPIRATORY_TRACT
  Filled 2017-04-10 (×6): qty 3

## 2017-04-10 MED ORDER — OXYCODONE HCL 5 MG PO TABS
10.0000 mg | ORAL_TABLET | ORAL | Status: DC | PRN
  Administered 2017-04-10 – 2017-04-17 (×17): 10 mg via ORAL
  Filled 2017-04-10 (×17): qty 2

## 2017-04-10 MED ORDER — DEXAMETHASONE 4 MG PO TABS: 4.0000 mg | ORAL_TABLET | ORAL | Status: DC

## 2017-04-10 MED ORDER — METOPROLOL TARTRATE 25 MG PO TABS
25.0000 mg | ORAL_TABLET | Freq: Two times a day (BID) | ORAL | Status: DC
  Administered 2017-04-10 – 2017-04-17 (×15): 25 mg via ORAL
  Filled 2017-04-10 (×15): qty 1

## 2017-04-10 MED ORDER — VANCOMYCIN HCL 10 G IV SOLR
1250.0000 mg | INTRAVENOUS | Status: DC
  Administered 2017-04-11: 1250 mg via INTRAVENOUS
  Filled 2017-04-10: qty 1250

## 2017-04-10 MED ORDER — LIP MEDEX EX OINT: 1.0000 "application " | TOPICAL_OINTMENT | CUTANEOUS | Status: DC | PRN

## 2017-04-10 MED ORDER — SODIUM CHLORIDE 0.9 % IV SOLN: INTRAVENOUS | Status: DC

## 2017-04-10 MED ORDER — HYDROCORTISONE 1 % EX CREA: 1.0000 "application " | TOPICAL_CREAM | Freq: Three times a day (TID) | CUTANEOUS | Status: DC | PRN

## 2017-04-10 MED ORDER — SENNOSIDES-DOCUSATE SODIUM 8.6-50 MG PO TABS
1.0000 | ORAL_TABLET | Freq: Every day | ORAL | Status: DC
  Administered 2017-04-10 – 2017-04-16 (×7): 1 via ORAL
  Filled 2017-04-10 (×7): qty 1

## 2017-04-10 MED ORDER — ENSURE ENLIVE PO LIQD
237.0000 mL | Freq: Three times a day (TID) | ORAL | Status: DC
  Filled 2017-04-10 (×2): qty 237

## 2017-04-10 MED ORDER — GUAIFENESIN ER 600 MG PO TB12
600.0000 mg | ORAL_TABLET | Freq: Two times a day (BID) | ORAL | Status: DC
  Administered 2017-04-10 – 2017-04-13 (×7): 600 mg via ORAL
  Filled 2017-04-10 (×7): qty 1

## 2017-04-10 MED ORDER — ONDANSETRON 4 MG PO TBDP
4.0000 mg | ORAL_TABLET | Freq: Three times a day (TID) | ORAL | Status: DC | PRN
  Administered 2017-04-16: 4 mg via ORAL
  Filled 2017-04-10: qty 1

## 2017-04-10 MED ORDER — SODIUM CHLORIDE 0.9 % IV SOLN
1.0000 g | Freq: Three times a day (TID) | INTRAVENOUS | Status: DC
  Filled 2017-04-10: qty 1

## 2017-04-10 MED ORDER — SODIUM CHLORIDE 0.9% FLUSH: 10.0000 mL | INTRAVENOUS | Status: DC | PRN

## 2017-04-10 MED ORDER — HYDROCORTISONE 2.5 % RE CREA: 1.0000 "application " | TOPICAL_CREAM | Freq: Four times a day (QID) | RECTAL | Status: DC | PRN

## 2017-04-10 MED ORDER — IPRATROPIUM-ALBUTEROL 0.5-2.5 (3) MG/3ML IN SOLN
3.0000 mL | RESPIRATORY_TRACT | Status: DC | PRN
  Administered 2017-04-10 – 2017-04-14 (×9): 3 mL via RESPIRATORY_TRACT
  Filled 2017-04-10 (×12): qty 3

## 2017-04-10 MED ORDER — FENTANYL CITRATE (PF) 100 MCG/2ML IJ SOLN
50.0000 ug | Freq: Once | INTRAMUSCULAR | Status: AC
  Administered 2017-04-10: 50 ug via INTRAVENOUS
  Filled 2017-04-10: qty 2

## 2017-04-10 MED ORDER — LORATADINE 10 MG PO TABS
10.0000 mg | ORAL_TABLET | Freq: Every day | ORAL | Status: DC
  Administered 2017-04-10 – 2017-04-17 (×8): 10 mg via ORAL
  Filled 2017-04-10 (×8): qty 1

## 2017-04-10 MED ORDER — CEFEPIME HCL 1 G IJ SOLR
1.0000 g | Freq: Two times a day (BID) | INTRAMUSCULAR | Status: DC
  Administered 2017-04-10 – 2017-04-12 (×5): 1 g via INTRAVENOUS
  Filled 2017-04-10 (×6): qty 1

## 2017-04-10 MED ORDER — ACETAMINOPHEN 500 MG PO TABS
1000.0000 mg | ORAL_TABLET | Freq: Once | ORAL | Status: AC
  Administered 2017-04-10: 1000 mg via ORAL
  Filled 2017-04-10: qty 2

## 2017-04-10 MED ORDER — SODIUM CHLORIDE 0.9 % IV SOLN: 1.0000 g | Freq: Three times a day (TID) | INTRAVENOUS | Status: DC

## 2017-04-10 MED ORDER — HEPARIN SODIUM (PORCINE) 5000 UNIT/ML IJ SOLN
5000.0000 [IU] | Freq: Three times a day (TID) | INTRAMUSCULAR | Status: DC
  Administered 2017-04-14: 5000 [IU] via SUBCUTANEOUS
  Filled 2017-04-10 (×7): qty 1

## 2017-04-10 MED ORDER — LIDOCAINE-PRILOCAINE 2.5-2.5 % EX CREA
1.0000 "application " | TOPICAL_CREAM | CUTANEOUS | Status: DC | PRN
  Filled 2017-04-10: qty 5

## 2017-04-10 MED ORDER — SODIUM CHLORIDE 0.9 % IV SOLN
INTRAVENOUS | Status: AC
Start: 2017-04-10 — End: 2017-04-11
  Administered 2017-04-10: 100 mL/h via INTRAVENOUS
  Administered 2017-04-11: via INTRAVENOUS

## 2017-04-10 MED ORDER — POLYVINYL ALCOHOL 1.4 % OP SOLN: 1.0000 [drp] | OPHTHALMIC | Status: DC | PRN

## 2017-04-10 MED ORDER — IPRATROPIUM-ALBUTEROL 0.5-2.5 (3) MG/3ML IN SOLN
3.0000 mL | Freq: Four times a day (QID) | RESPIRATORY_TRACT | Status: DC
  Administered 2017-04-10 (×2): 3 mL via RESPIRATORY_TRACT
  Filled 2017-04-10 (×2): qty 3

## 2017-04-10 MED ORDER — VANCOMYCIN HCL IN DEXTROSE 1-5 GM/200ML-% IV SOLN
1000.0000 mg | Freq: Once | INTRAVENOUS | Status: AC
  Administered 2017-04-10: 1000 mg via INTRAVENOUS
  Filled 2017-04-10: qty 200

## 2017-04-10 MED ORDER — SODIUM CHLORIDE 0.9 % IV BOLUS (SEPSIS)
1000.0000 mL | Freq: Once | INTRAVENOUS | Status: AC
  Administered 2017-04-10: 1000 mL via INTRAVENOUS

## 2017-04-10 MED ORDER — MUSCLE RUB 10-15 % EX CREA: 1.0000 "application " | TOPICAL_CREAM | CUTANEOUS | Status: DC | PRN

## 2017-04-10 MED ORDER — AMLODIPINE BESYLATE 10 MG PO TABS
10.0000 mg | ORAL_TABLET | Freq: Every day | ORAL | Status: DC
  Administered 2017-04-10 – 2017-04-17 (×7): 10 mg via ORAL
  Filled 2017-04-10 (×8): qty 1

## 2017-04-10 NOTE — Telephone Encounter (Addendum)
Received fax from [facility] stating the Trelegy is requiring PA Called PA number for [employer] @ [phone]  Spoke with representative [place] - PA initiated over the phone Pt has tried and failed 2 covered alternatives: Dulera and Spiriva There are no contraindications to changing therapy Therapy change due to [facility] and Spiriva not managing symptoms  Per [place], [place] sent for review and decision will be received within 24 hours  PA# [phone] Ref# [id number]

## 2017-04-10 NOTE — ED Triage Notes (Signed)
Per EMS- Patient is from [facility]. Patient has Stage IV lung cancer with mets. Patient c/o not enough pain control. Patient was given a Percocet at 0600.

## 2017-04-10 NOTE — H&P (Addendum)
History and Physical    SYDNEY AZURE EPP:[id number] DOB: 1960/02/17 DOA: 04/10/2017  PCP: Patient, No Pcp Per Patient coming from: Home  Chief Complaint: Shortness of breath  HPI: [name] is a 57 y.o. 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  Patient states he has been feeling sick on and off since being on chemotherapy last year but over the course of last 3 or 4 days his shortness of breath has progressively worsened with subjective fevers and chills at his nursing home.  States he is constantly surrounded by sick people at the nursing home.  Reports a mild productive cough bringing up whitish to brownish sputum.  He is supposed to be using 3 L nasal cannula continuously but only uses it off and on 2 L and sometimes bumps it up to 3 L if necessary.  During this time his appetite has been very poor.  Denies any chest pain, abdominal pain, nausea, vomiting, headache, dizziness, lower extremity pain or any other complaints. He routinely follows with [name] from oncology for his chemotherapy. He also  recently visited ER for which has somewhat worsening therefore get taking oxycodone.  This is suspected to be secondary from his metastases. In the ER today he was noted to have pneumonia on the right side in the middle and the lower lobe.  He was started on healthcare acquired pneumonia treatment with vancomycin and cefepime.  His creatinine was noted to be slightly elevated from 1.4 baseline to 2.1.  Gentle hydration was started.  His BNP was 49.   Review of Systems: As per HPI otherwise 10 point review of systems negative.  Review of Systems Otherwise negative except as per HPI, including: General: Denies  night sweats or unintended weight loss. Resp: Denies  wheezing Cardiac: Denies chest pain, palpitations,  orthopnea, paroxysmal nocturnal dyspnea. GI: Denies abdominal pain, nausea, vomiting, diarrhea or constipation GU: Denies dysuria, frequency, hesitancy or incontinence MS: Denies muscle aches, joint pain or swelling Neuro: Denies headache, neurologic deficits (focal weakness, numbness, tingling), abnormal gait Psych: Denies anxiety, depression, SI/HI/AVH Skin: Denies new rashes or lesions ID: Denies sick contacts, exotic exposures, travel  Past Medical History:  Diagnosis Date  . Abdominal pain 06/04/2016  . Adenocarcinoma of left lung, stage 4 ([place]) 05/02/2016  . Alcohol abuse   . Bronchitis due to tobacco use ([place])   . Cancer ([place])    Lung  . COPD (chronic obstructive pulmonary disease) ([place])   . Dehydration 06/04/2016  . Encounter for antineoplastic chemotherapy 05/02/2016  . Gastritis   . Goals of care, counseling/discussion 05/02/2016  . Hematemesis   . HTN (hypertension) 10/30/2016  . Recurrent upper respiratory infection (URI)   . Seizures ([place]) 05/2011   new onset  . Shortness of breath     Past Surgical History:  Procedure Laterality Date  . ESOPHAGOGASTRODUODENOSCOPY (EGD) WITH PROPOFOL N/A 12/17/2016   Procedure: ESOPHAGOGASTRODUODENOSCOPY (EGD) WITH PROPOFOL;  Surgeon: Ladene Artist, MD;  Location: [facility];  Service: Endoscopy;  Laterality: N/A;  . IR FLUORO GUIDE PORT INSERTION RIGHT  05/13/2016  . IR US GUIDE VASC ACCESS RIGHT  05/13/2016  . NO PAST SURGERIES       reports that he has been smoking cigarettes.  He has a 3.00 pack-year smoking history. He has never used smokeless tobacco. He reports that he does not  drink alcohol or use drugs.  No Known Allergies  Family History  Problem Relation Age of Onset  . Cancer Father   . Diabetes Mellitus II Mother      Prior to Admission medications   Medication Sig Start Date End Date Taking? Authorizing Provider  acetaminophen (TYLENOL) 500 MG tablet Take 500 mg by mouth 2 (two) times daily as needed for  mild pain or headache.   Yes [provider]  albuterol (PROVENTIL) (2.5 MG/3ML) 0.083% nebulizer solution 1 neb every 4-6 hours as needed for wheezing and shortness of breath Patient taking differently: Take 2.5 mg by nebulization every 4 (four) hours as needed for wheezing or shortness of breath.  04/01/17  Yes Parrett, Tammy S, NP  amLODipine (NORVASC) 10 MG tablet Take 10 mg by mouth daily.   Yes [provider]  Chlorphen-Phenyleph-ASA (ALKA-SELTZER PLUS COLD PO) Take 2 tablets by mouth at bedtime as needed (COUGH).   Yes [provider]  dexamethasone (DECADRON) 4 MG tablet Take 4 mg by mouth as directed. Take 1 tablet twice daily the day before, the day of and the day after chemotherapy every 3 weeks.   Yes [provider]  feeding supplement, ENSURE ENLIVE, (ENSURE ENLIVE) LIQD Take 237 mLs by mouth 3 (three) times daily between meals. 03/19/17  Yes Shelly Coss, MD  fluconazole (DIFLUCAN) 100 MG tablet Take 1 tablet (100 mg total) by mouth daily. Patient taking differently: Take 100 mg by mouth daily. Continuous 03/20/17  Yes Adhikari, Tamsen Meek, MD  Fluticasone-Umeclidin-Vilant (TRELEGY ELLIPTA) 100-62.5-25 MCG/INH AEPB Inhale 1 puff into the lungs daily. 04/01/17  Yes Parrett, Tammy S, NP  furosemide (LASIX) 40 MG tablet Take 1 tablet (40 mg total) by mouth every three (3) days as needed for fluid or edema. 02/16/17 04/10/17 Yes Florencia Reasons, MD  guaiFENesin (MUCINEX) 600 MG 12 hr tablet Take 1 tablet (600 mg total) by mouth 2 (two) times daily. 03/19/17  Yes Shelly Coss, MD  lidocaine-prilocaine (EMLA) cream Apply 1 application topically as needed (for port access).    Yes [provider]  loratadine (CLARITIN) 10 MG tablet Take 10 mg by mouth daily.   Yes [provider]  metoprolol tartrate (LOPRESSOR) 25 MG tablet Take 25 mg by mouth 2 (two) times daily.   Yes [provider]  ondansetron (ZOFRAN-ODT) 4 MG disintegrating tablet Take  4 mg by mouth every 8 (eight) hours as needed for nausea or vomiting.   Yes [provider]  Oxycodone HCl 10 MG TABS Take 1 tablet (10 mg total) by mouth every 4 (four) hours as needed (or shortness of breath). Patient taking differently: Take 10 mg by mouth every 4 (four) hours as needed (for severe pain or shortness of breath).  04/08/17  Yes Curt Bears, MD  OXYGEN Inhale 2 L into the lungs continuous.   Yes [provider]  pantoprazole (PROTONIX) 40 MG tablet Take 1 tablet (40 mg total) by mouth 2 (two) times daily. 12/18/16  Yes Eugenie Filler, MD  polyethylene glycol [facility] Floria Raveling) packet Take 17 g by mouth daily as needed for mild constipation. 03/19/17  Yes Adhikari, Tamsen Meek, MD  senna-docusate (SENNA-PLUS) 8.6-50 MG tablet Take 1 tablet by mouth at bedtime.   Yes [provider]  bisoprolol (ZEBETA) 5 MG tablet Take 1 tablet (5 mg total) by mouth daily. Patient not taking: Reported on 04/10/2017 02/16/17   Florencia Reasons, MD  Ipratropium-Albuterol (COMBIVENT) 20-100 MCG/ACT AERS respimat Inhale 1 puff into  the lungs every 6 (six) hours. Patient not taking: Reported on 04/07/2017 07/22/16   Curt Bears, MD  mometasone-formoterol [facility]) 100-5 MCG/ACT AERO Inhale 2 puffs into the lungs 2 (two) times daily. Patient not taking: Reported on 04/10/2017 03/19/17   Shelly Coss, MD  tiotropium (SPIRIVA) 18 MCG inhalation capsule Place 1 capsule (18 mcg total) into inhaler and inhale daily. Patient not taking: Reported on 04/10/2017 03/20/17   Shelly Coss, MD    Physical Exam: Vitals:   04/10/17 0853 04/10/17 0900 04/10/17 1022 04/10/17 1024  BP:  (!) 141/75 128/79 (!) 117/98  Pulse:  (!) 107 100 (!) 103  Resp:   (!) 27 20  Temp:      TempSrc:      SpO2:  95% 94% 96%  Weight: 57.6 kg (127 lb)     Height: 5\' 11"  (1.803 m)         Constitutional: NAD, calm, comfortable, elderly frail-appearing with bilateral temporal wasting Vitals:   04/10/17  0853 04/10/17 0900 04/10/17 1022 04/10/17 1024  BP:  (!) 141/75 128/79 (!) 117/98  Pulse:  (!) 107 100 (!) 103  Resp:   (!) 27 20  Temp:      TempSrc:      SpO2:  95% 94% 96%  Weight: 57.6 kg (127 lb)     Height: 5\' 11"  (1.803 m)      Eyes: PERRL, lids and conjunctivae normal ENMT: Mucous membranes are moist. Posterior pharynx clear of any exudate or lesions.Normal dentition.  Neck: normal, supple, no masses, no thyromegaly Respiratory: Mild diffuse coarse breath sounds more on the right than left Cardiovascular: Regular rate and rhythm, no murmurs / rubs / gallops. No extremity edema. 2+ pedal pulses. No carotid bruits.  Abdomen: no tenderness, no masses palpated. No hepatosplenomegaly. Bowel sounds positive.  Musculoskeletal: no clubbing / cyanosis. No joint deformity upper and lower extremities. Good ROM, no contractures. Normal muscle tone.  Skin: no rashes, lesions, ulcers. No induration Neurologic: CN 2-12 grossly intact. Sensation intact, DTR normal. Strength 5/5 in all 4.  Psychiatric: Normal judgment and insight. Alert and oriented x 3. Normal mood.     Labs on Admission: I have personally reviewed following labs and imaging studies  CBC: Recent Labs  Lab 04/07/17 1954 04/10/17 0926  WBC 5.1 5.3  NEUTROABS 4.2 3.8  HGB 8.1* 8.1*  HCT 24.4* 24.5*  MCV 100.8* 102.1*  PLT 31* 82*   Basic Metabolic Panel: Recent Labs  Lab 04/07/17 1954 04/10/17 0926  NA 136 137  K 3.7 4.1  CL 94* 94*  CO2 32 32  GLUCOSE 92 117*  BUN 22* 18  CREATININE 2.10* 2.01*  CALCIUM 8.8* 9.1   GFR: Estimated Creatinine Clearance: 33 mL/min (A) (by C-G formula based on SCr of 2.01 mg/dL (H)). Liver Function Tests: Recent Labs  Lab 04/07/17 1954 04/10/17 0926  AST 29 24  ALT 19 14*  ALKPHOS 109 112  BILITOT 0.5 0.5  PROT 6.7 6.7  ALBUMIN 2.6* 2.6*   No results for input(s): LIPASE, AMYLASE in the last 168 hours. No results for input(s): AMMONIA in the last 168  hours. Coagulation Profile: No results for input(s): INR, PROTIME in the last 168 hours. Cardiac Enzymes: Recent Labs  Lab 04/10/17 0926  TROPONINI <0.03   BNP (last 3 results) No results for input(s): PROBNP in the last 8760 hours. HbA1C: No results for input(s): HGBA1C in the last 72 hours. CBG: No results for input(s): GLUCAP in the last 168  hours. Lipid Profile: No results for input(s): CHOL, HDL, LDLCALC, TRIG, CHOLHDL, LDLDIRECT in the last 72 hours. Thyroid Function Tests: No results for input(s): TSH, T4TOTAL, FREET4, T3FREE, THYROIDAB in the last 72 hours. Anemia Panel: No results for input(s): VITAMINB12, FOLATE, FERRITIN, TIBC, IRON, RETICCTPCT in the last 72 hours. Urine analysis:    Component Value Date/Time   COLORURINE YELLOW 12/30/2016 2049   APPEARANCEUR CLEAR 12/30/2016 2049   LABSPEC 1.005 12/30/2016 2049   PHURINE 7.0 12/30/2016 2049   GLUCOSEU NEGATIVE 12/30/2016 2049   HGBUR NEGATIVE 12/30/2016 2049   [place] 12/30/2016 2049   [place] NEGATIVE 12/30/2016 2049   PROTEINUR NEGATIVE 12/30/2016 2049   UROBILINOGEN 0.2 05/29/2011 0122   NITRITE NEGATIVE 12/30/2016 2049   LEUKOCYTESUR NEGATIVE 12/30/2016 2049   Sepsis Labs: !!!!!!!!!!!!!!!!!!!!!!!!!!!!!!!!!!!!!!!!!!!! @LABRCNTIP (procalcitonin:4,lacticidven:4) )No results found for this or any previous visit (from the past 240 hour(s)).   Radiological Exams on Admission: Dg Chest 2 View  Result Date: 04/10/2017 CLINICAL DATA:  Cough and  shortness of breath EXAM: CHEST - 2 VIEW COMPARISON:  Chest radiograph 03/17/2017 FINDINGS: Right anterior chest wall Port-A-Cath is present with tip projecting over the right atrium. Stable cardiac and mediastinal contours. Interval development of right-greater-than-left mid lower lung heterogeneous opacities. Small bilateral pleural effusions. IMPRESSION: New right mid and lower lung opacities with minimal left lung base opacities concerning for pneumonia.  Small bilateral pleural effusions. Electronically Signed   By: Lovey Newcomer M.D.   On: 04/10/2017 10:33   Dg Lumbar Spine Complete  Result Date: 04/10/2017 CLINICAL DATA:  Low back pain EXAM: LUMBAR SPINE - COMPLETE 4+ VIEW COMPARISON:  None. FINDINGS: No fracture. No subluxation. Mild loss of disc height is seen at L3-4 and L4-5. There is minimal facet degeneration at lower lumbar levels. IMPRESSION: Minimal degenerative changes.  No acute findings. Electronically Signed   By: Misty Stanley M.D.   On: 04/10/2017 10:31     Assessment/Plan Principal Problem:   HCAP (healthcare-associated pneumonia) Active Problems:   Seizure ([place])   Tobacco abuse   Adenocarcinoma of left lung, stage 4 ([facility])   Spine metastasis ([facility])   COPD (chronic obstructive pulmonary disease) ([facility])   HTN (hypertension)   Severe malnutrition ([facility])   CKD (chronic kidney disease), stage III ([facility])   Chronic respiratory failure with hypoxia ([facility])    Acute respiratory distress Healthcare acquired pneumonia, right middle and right lower lobe -Admit the patient to the hospital for further care - Cultures have been ordered, sputum culture, respiratory panel - Chest x-ray suggestive of right middle and right lower lobe pneumonia -Patient is an immunocompromised state and is currently on chemotherapy.  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 outpatient.  Currently on maintenance chemotherapy - Previously has received radiation for his bony metastases -Pain control, bowel regimen as needed  History of COPD Active tobacco use, 2-3 cigarettes/week Chronic hypoxic respiratory failure, on 3 L nasal cannula -Counseled to quit using tobacco - Bronchodilators ordered -Followed outpatient pulmonary.   He is supposed to be using 3 L nasal cannula continuously but only uses intermittently at his nursing home  Acute on chronic chronic kidney disease stage III -Baseline creatinine appears to be around 1.4, today's 2.0 -We will gently hydrate him and follow his renal function.  Anemia and thrombocytopenia -Likely from  marrow suppression from being on chemotherapy.  We will continue to monitor hemoglobin and platelets.  If platelets continue to drop we will have to discontinue subcutaneous heparin  Moderate to severe protein calorie malnutrition -Encourage p.o. diet.  Dietitian consult ordered  DVT prophylaxis: Subcutaneous heparin.  Closely monitor in case if thrombocytopenia worsens Code Status: Full code Family Communication: None at bedside Disposition Plan: To be determined Consults called: Oncologist to the treatment team Admission status: Inpatient admission   Given the aforementioned, the predictability of an adverse outcome is felt to be significant. I expect that the patient will require at least 2 midnights in the hospital to treat this condition especially in the setting of immunocompromised state on active chemotherapy   Ankit Arsenio Loader MD [facility] Pager [phone]  If 7PM-7AM, please contact night-coverage [email or web address] Password [facility]  04/10/2017, 11:48 AM

## 2017-04-10 NOTE — ED Notes (Signed)
ED TO INPATIENT HANDOFF REPORT  Name/Age/Gender [name] 57 y.o. male  Code Status    Code Status Orders  (From admission, onward)        Start     Ordered   04/10/17 1145  Full code  Continuous     04/10/17 1147    Code Status History    Date Active Date Inactive Code Status Order ID Comments User Context   03/09/2017 2359 03/19/2017 1931 Full Code [id number]  Cristy Folks, MD Inpatient   02/14/2017 0902 02/16/2017 1455 Full Code [id number]  Florencia Reasons, MD Inpatient   02/10/2017 0031 02/14/2017 0902 DNR [id number]  Norval Morton, MD ED   12/30/2016 2325 01/07/2017 2109 DNR [id number]  Elodia [name]., MD Inpatient   12/17/2016 0934 12/24/2016 1811 DNR [id number]  Thurnell Lose, MD Inpatient   12/16/2016 1649 12/17/2016 0934 Full Code [id number]  Charlynne Cousins, MD Inpatient      Home/SNF/Other Home  Chief Complaint cancer pt / pain management   Level of Care/Admitting Diagnosis ED Disposition    ED Disposition Condition [place]: [facility] [[id number]]  Level of Care: Med-Surg [16]  Diagnosis: HCAP (healthcare-associated pneumonia) [[id number]]  Admitting Physician: Gerlean Ren [facility] [[id number]]  Attending Physician: Gerlean Ren [facility] [[id number]]  Estimated length of stay: past midnight tomorrow  Certification:: I certify this patient will need inpatient services for at least 2 midnights  PT Class (Do Not Modify): Inpatient [101]  PT Acc Code (Do Not Modify): Private [1]       Medical History Past Medical History:  Diagnosis Date  . Abdominal pain 06/04/2016  . Adenocarcinoma of left lung, stage 4 ([place]) 05/02/2016  . Alcohol abuse   . Bronchitis due to tobacco use ([place])   . Cancer ([place])    Lung  . COPD (chronic obstructive pulmonary disease) ([place])   . Dehydration 06/04/2016  . Encounter for antineoplastic chemotherapy 05/02/2016  . Gastritis   . Goals of care, counseling/discussion 05/02/2016  .  Hematemesis   . HTN (hypertension) 10/30/2016  . Recurrent upper respiratory infection (URI)   . Seizures ([place]) 05/2011   new onset  . Shortness of breath     Allergies No Known Allergies  IV Location/Drains/Wounds Patient Lines/Drains/Airways Status   Active Line/Drains/Airways    Name:   Placement date:   Placement time:   Site:   Days:   Implanted Port 05/13/16 Right Chest   05/13/16    -    Chest   332          Labs/Imaging Results for orders placed or performed during the hospital encounter of 04/10/17 (from the past 48 hour(s))  CBC with Differential     Status: Abnormal   Collection Time: 04/10/17  9:26 AM  Result Value Ref Range   WBC 5.3 4.0 - 10.5 K/uL   RBC 2.40 (L) 4.22 - 5.81 MIL/uL   Hemoglobin 8.1 (L) 13.0 - 17.0 g/dL   HCT 24.5 (L) 39.0 - 52.0 %   MCV 102.1 (H) 78.0 - 100.0 fL   MCH 33.8 26.0 - 34.0 pg   MCHC 33.1 30.0 - 36.0 g/dL   RDW 14.6 11.5 - 15.5 %   Platelets 82 (L) 150 - 400 K/uL    Comment: REPEATED TO VERIFY SPECIMEN CHECKED FOR CLOTS PLATELET COUNT CONFIRMED BY SMEAR    Neutrophils Relative % 71 %   Lymphocytes Relative 13 %   Monocytes Relative  16 %   Eosinophils Relative 0 %   Basophils Relative 0 %   Neutro Abs 3.8 1.7 - 7.7 K/uL   Lymphs Abs 0.7 0.7 - 4.0 K/uL   Monocytes Absolute 0.8 0.1 - 1.0 K/uL   Eosinophils Absolute 0.0 0.0 - 0.7 K/uL   Basophils Absolute 0.0 0.0 - 0.1 K/uL   WBC Morphology MILD LEFT SHIFT (1-5% METAS, OCC MYELO, OCC BANDS)     Comment: Performed at [facility], [place] [address], [place], [place] [address]  Comprehensive metabolic panel     Status: Abnormal   Collection Time: 04/10/17  9:26 AM  Result Value Ref Range   Sodium 137 135 - 145 mmol/L   Potassium 4.1 3.5 - 5.1 mmol/L   Chloride 94 (L) 101 - 111 mmol/L   CO2 32 22 - 32 mmol/L   Glucose, Bld 117 (H) 65 - 99 mg/dL   [name] 18 6 - 20 mg/dL   Creatinine, Ser 2.01 (H) 0.61 - 1.24 mg/dL   Calcium 9.1 8.9 - 10.3 mg/dL   Total Protein  6.7 6.5 - 8.1 g/dL   Albumin 2.6 (L) 3.5 - 5.0 g/dL   AST 24 15 - 41 U/L   ALT 14 (L) 17 - 63 U/L   Alkaline Phosphatase 112 38 - 126 U/L   Total Bilirubin 0.5 0.3 - 1.2 mg/dL   GFR calc non Af Amer 35 (L) >60 mL/min   GFR calc Af Amer 41 (L) >60 mL/min    Comment: (NOTE) The eGFR has been calculated using the CKD EPI equation. This calculation has not been validated in all clinical situations. eGFR's persistently <60 mL/min signify possible Chronic Kidney Disease.    Anion gap 11 5 - 15    Comment: Performed at [facility], [place] [address], [place], [place] [address]  Troponin I     Status: None   Collection Time: 04/10/17  9:26 AM  Result Value Ref Range   Troponin I <0.03 <0.03 ng/mL    Comment: Performed at [facility], [place] [address], [place], [place] [address]  Brain natriuretic peptide     Status: None   Collection Time: 04/10/17  9:27 AM  Result Value Ref Range   B Natriuretic Peptide 49.4 0.0 - 100.0 pg/mL    Comment: Performed at [facility], [place] [address], [place], [place] [address]  I-Stat CG4 Lactic Acid, ED     Status: None   Collection Time: 04/10/17 11:03 AM  Result Value Ref Range   Lactic Acid, Venous 0.66 0.5 - 1.9 mmol/L   Dg Chest 2 View  Result Date: 04/10/2017 CLINICAL DATA:  Cough and  shortness of breath EXAM: CHEST - 2 VIEW COMPARISON:  Chest radiograph 03/17/2017 FINDINGS: Right anterior chest wall Port-A-Cath is present with tip projecting over the right atrium. Stable cardiac and mediastinal contours. Interval development of right-greater-than-left mid lower lung heterogeneous opacities. Small bilateral pleural effusions. IMPRESSION: New right mid and lower lung opacities with minimal left lung base opacities concerning for pneumonia. Small bilateral pleural effusions. Electronically Signed   By: Lovey Newcomer M.D.   On: 04/10/2017 10:33   Dg Lumbar Spine Complete  Result Date:  04/10/2017 CLINICAL DATA:  Low back pain EXAM: LUMBAR SPINE - COMPLETE 4+ VIEW COMPARISON:  None. FINDINGS: No fracture. No subluxation. Mild loss of disc height is seen at L3-4 and L4-5. There is minimal facet degeneration at lower lumbar levels. IMPRESSION: Minimal degenerative changes.  No acute findings. Electronically Signed   By: Misty Stanley M.D.   On: 04/10/2017 10:31    Pending Labs Unresulted Labs (From admission, onward)   Start     Ordered   04/11/17 0500  Comprehensive metabolic panel  Tomorrow morning,   R     04/10/17 1147   04/11/17 0500  CBC WITH DIFFERENTIAL  Tomorrow morning,   R     04/10/17 1147   04/11/17 0500  Creatinine, serum  Daily,   R     04/10/17 1155   04/10/17 1146  Respiratory Panel by PCR  (Respiratory virus panel)  Once,   R     04/10/17 1147   04/10/17 1146  Legionella Pneumophila Serogp 1 Ur Ag  Once,   R     04/10/17 1147   04/10/17 1144  Culture, sputum-assessment  Once,   R     04/10/17 1147   04/10/17 1144  Gram stain  Once,   R     04/10/17 1147   04/10/17 1144  HIV antibody (Routine Screening)  Once,   R     04/10/17 1147   04/10/17 1144  Strep pneumoniae urinary antigen  Once,   R     04/10/17 1147   04/10/17 1144  CBC  (heparin)  Once,   R    Comments:  Baseline for heparin therapy IF NOT ALREADY DRAWN.  Notify MD if PLT < 100 K.    04/10/17 1147   04/10/17 1144  Creatinine, serum  (heparin)  Once,   R    Comments:  Baseline for heparin therapy IF NOT ALREADY DRAWN.    04/10/17 1147   04/10/17 1038  Blood culture (routine x 2)  BLOOD CULTURE X 2,   STAT     04/10/17 1037   04/10/17 0859  Urinalysis, Routine w reflex microscopic  Once,   R     04/10/17 0858      Vitals/Pain Today's Vitals   04/10/17 1024 04/10/17 1200 04/10/17 1300 04/10/17 1411  BP: (!) 117/98 131/74 132/82   Pulse: (!) 103 89 (!) 104   Resp: 20 16 (!) 25   Temp:      TempSrc:      SpO2: 96% 95% 94% 95%  Weight:      Height:      PainSc:         Isolation Precautions Droplet precaution  Medications Medications  pantoprazole (PROTONIX) EC tablet 40 mg (40 mg Oral Given 04/10/17 1529)  ondansetron (ZOFRAN-ODT) disintegrating tablet 4 mg (has no administration in time range)  furosemide (LASIX) tablet 40 mg (has no administration in time range)  loratadine (CLARITIN) tablet 10 mg (10 mg Oral Given 04/10/17 1529)  senna-docusate (Senokot-S) tablet 1 tablet (has no administration in time range)  amLODipine (NORVASC) tablet 10 mg (has no administration in time range)  lidocaine-prilocaine (EMLA) cream 1 application (has no administration in time range)  guaiFENesin (MUCINEX) 12 hr tablet 600 mg (600 mg Oral Given 04/10/17 1528)  polyethylene glycol (MIRALAX / GLYCOLAX) packet 17 g (has no administration in time range)  feeding supplement (ENSURE ENLIVE) (ENSURE ENLIVE) liquid 237 mL (has no administration in time range)  Fluticasone-Umeclidin-Vilant 100-62.5-25 MCG/INH AEPB 1 puff (has no administration in time range)  metoprolol tartrate (LOPRESSOR) tablet 25 mg (has no administration in time range)  oxyCODONE (Oxy IR/ROXICODONE) immediate release tablet 10 mg (has no administration in time range)  heparin injection 5,000 Units (has no administration in time  range)  ipratropium-albuterol (DUONEB) 0.5-2.5 (3) MG/3ML nebulizer solution 3 mL (3 mLs Nebulization Given 04/10/17 1408)  ipratropium-albuterol (DUONEB) 0.5-2.5 (3) MG/3ML nebulizer solution 3 mL (3 mLs Nebulization Given 04/10/17 1240)  0.9 %  sodium chloride infusion (100 mL/hr Intravenous New Bag/Given 04/10/17 1209)  ceFEPIme (MAXIPIME) 1 g in sodium chloride 0.9 % 100 mL IVPB (1 g Intravenous New Bag/Given 04/10/17 1221)  vancomycin (VANCOCIN) 1,250 mg in sodium chloride 0.9 % 250 mL IVPB (has no administration in time range)  HYDROmorphone (DILAUDID) injection 1 mg (1 mg Intravenous Given 04/10/17 0922)  sodium chloride 0.9 % bolus 1,000 mL (0 mLs Intravenous Stopped 04/10/17  1205)  acetaminophen (TYLENOL) tablet 1,000 mg (1,000 mg Oral Given 04/10/17 1113)  fentaNYL (SUBLIMAZE) injection 50 mcg (50 mcg Intravenous Given 04/10/17 1114)  vancomycin (VANCOCIN) IVPB 1000 mg/200 mL premix (0 mg Intravenous Stopped 04/10/17 1521)    Mobility walks with person assist

## 2017-04-10 NOTE — ED Notes (Signed)
Bed: BB40 Expected date:  Expected time:  Means of arrival:  Comments: EMS-pain/cancer patient

## 2017-04-10 NOTE — Progress Notes (Signed)
Pharmacy Antibiotic Note  [name] is a 57 y.o. male admitted on 04/10/2017 with pneumonia.  Pharmacy has been consulted for vancomycin and cefepime dosing. Patient with stage IV lung cancer  Today, 04/10/2017  Renal: CKD - ? AKI (SCr above baseline 1.4-1.6)  WBC WNL  afebrile  Plan:  Vancomycin 1gm x 1 in ED then 1250mg  IV q48h.  Give 1st maintenance dose within 24h of ED dose  Cefepime 1gm IV q12h  Check daily SCr for AKI (on CKD)  Check vancomycin levels as indicated  Follow-up ability to narrow antibotics  Height: 5\' 11"  (180.3 cm) Weight: 127 lb (57.6 kg) IBW/kg (Calculated) : 75.3  Temp (24hrs), Avg:98.4 F (36.9 C), Min:98.4 F (36.9 C), Max:98.4 F (36.9 C)  Recent Labs  Lab 04/07/17 1954 04/10/17 0926 04/10/17 1103  WBC 5.1 5.3  --   CREATININE 2.10* 2.01*  --   LATICACIDVEN  --   --  0.66    Estimated Creatinine Clearance: 33 mL/min (A) (by C-G formula based on SCr of 2.01 mg/dL (H)).    No Known Allergies  Antimicrobials this admission: 3/21 vanco >> 3/21 cefepime >>  Dose adjustments this admission:  Microbiology results: 3/21 BCx 3/21 resp panel:  3/21 strep Ag 3/21 Legionella Ag:   Thank you for allowing pharmacy to be a part of this patient's care.  Doreene Eland, PharmD, BCPS.   Pager: [phone] 04/10/2017 12:13 PM

## 2017-04-10 NOTE — Progress Notes (Signed)
Patient had an episode where he felt anxious and stated that he could not breath. This RN and NT went into room he was standing and tachypnea. O2 saturations at that moment was 67% on 3L. This RN went to put a nonrebreather on patient. Before it could be hooked up oxygen saturations started to improve within 1-2 mins oxygen saturations climbed to 95% 3L. MD was made aware of the episode.

## 2017-04-10 NOTE — ED Provider Notes (Signed)
[facility] DEPT Provider Note   CSN: [id number] Arrival date & time: 04/10/17  0844     History   Chief Complaint Chief Complaint  Patient presents with  . pain management  . cancer patient    HPI [name] is a 57 y.o. male.  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  The patient was just seen for similar symptoms.  He was prescribed oxycodone by his oncologist.  He states that over the last 48 hours, is had progressive worsening pain and weakness.  He describes the pain is an aching, throbbing, severe, lower back pain.  This does not radiate to the legs.  Denies any new lower extremity weakness or numbness.  He also endorses generalized fatigue and mild increase in chronic shortness of breath.  He is been coughing more.  He also endorses lightheadedness upon standing and poor appetite.  He also notes increased swelling of his bilateral legs.  The swelling has been ongoing for several weeks, but the weakness is worsened over the last 48 hours.  Denies any associated abdominal pain.  No urinary symptoms.  No fevers or chills.  He is been taking his oxycodone without significant relief.  He is also having difficulty walking due to lightheadedness upon standing and weakness.  Past Medical History:  Diagnosis Date  . Abdominal pain 06/04/2016  . Adenocarcinoma of left lung, stage 4 ([place]) 05/02/2016  . Alcohol abuse   . Bronchitis due to tobacco use ([place])   . Cancer ([place])    Lung  . COPD (chronic obstructive pulmonary disease) ([place])   . Dehydration 06/04/2016  . Encounter for antineoplastic chemotherapy 05/02/2016  . Gastritis   . Goals of care, counseling/discussion 05/02/2016  . Hematemesis   . HTN (hypertension) 10/30/2016  . Recurrent upper respiratory infection (URI)   . Seizures ([place]) 05/2011   new onset  . Shortness of  breath     Patient Active Problem List   Diagnosis Date Noted  . Chronic respiratory failure with hypoxia ([place]) 04/01/2017  . Leucocytosis 03/16/2017  . CKD (chronic kidney disease), stage III ([place]) 03/12/2017  . Malnutrition of moderate degree 03/10/2017  . COPD with acute exacerbation ([place]) 03/09/2017  . Anemia 02/07/2017  . Severe malnutrition ([place]) 01/03/2017  . DOE (dyspnea on exertion) 12/30/2016  . Nausea   . Hypervolemia   . HCAP (healthcare-associated pneumonia) 12/19/2016  . Dyspnea and respiratory abnormality 12/19/2016  . Erosive gastropathy 12/18/2016  . Gastritis 12/18/2016  . Hematemesis 12/16/2016  . Intractable nausea and vomiting 12/16/2016  . HTN (hypertension) 10/30/2016  . Chronic obstructive pulmonary disease ([place]) 09/02/2016  . COPD (chronic obstructive pulmonary disease) ([place]) 08/19/2016  . Dehydration 06/04/2016  . Abdominal pain 06/04/2016  . Spine metastasis ([place]) 05/13/2016  . Adenocarcinoma of left lung, stage 4 ([place]) 05/02/2016  . Goals of care, counseling/discussion 05/02/2016  . Encounter for antineoplastic chemotherapy 05/02/2016  . Tobacco abuse 05/30/2011  . Alcohol abuse 05/30/2011  . Seizure ([place]) 05/28/2011    Past Surgical History:  Procedure Laterality Date  . ESOPHAGOGASTRODUODENOSCOPY (EGD) WITH PROPOFOL N/A 12/17/2016   Procedure: ESOPHAGOGASTRODUODENOSCOPY (EGD) WITH PROPOFOL;  Surgeon: Ladene Artist, MD;  Location: [facility] ENDOSCOPY;  Service: Endoscopy;  Laterality: N/A;  . IR FLUORO GUIDE PORT INSERTION RIGHT  05/13/2016  . IR US GUIDE VASC ACCESS RIGHT  05/13/2016  . NO PAST SURGERIES  Home Medications    Prior to Admission medications   Medication Sig Start Date End Date Taking? Authorizing Provider  acetaminophen (TYLENOL) 500 MG tablet Take 500 mg by mouth 2 (two) times daily as needed for mild pain or headache.   Yes [provider]  albuterol (PROVENTIL) (2.5 MG/3ML) 0.083% nebulizer solution 1 neb  every 4-6 hours as needed for wheezing and shortness of breath Patient taking differently: Take 2.5 mg by nebulization every 4 (four) hours as needed for wheezing or shortness of breath.  04/01/17  Yes Parrett, Tammy S, NP  amLODipine (NORVASC) 10 MG tablet Take 10 mg by mouth daily.   Yes [provider]  Chlorphen-Phenyleph-ASA (ALKA-SELTZER PLUS COLD PO) Take 2 tablets by mouth at bedtime as needed (COUGH).   Yes [provider]  dexamethasone (DECADRON) 4 MG tablet Take 4 mg by mouth as directed. Take 1 tablet twice daily the day before, the day of and the day after chemotherapy every 3 weeks.   Yes [provider]  feeding supplement, ENSURE ENLIVE, (ENSURE ENLIVE) LIQD Take 237 mLs by mouth 3 (three) times daily between meals. 03/19/17  Yes Shelly Coss, MD  fluconazole (DIFLUCAN) 100 MG tablet Take 1 tablet (100 mg total) by mouth daily. Patient taking differently: Take 100 mg by mouth daily. Continuous 03/20/17  Yes Adhikari, Tamsen Meek, MD  Fluticasone-Umeclidin-Vilant (TRELEGY ELLIPTA) 100-62.5-25 MCG/INH AEPB Inhale 1 puff into the lungs daily. 04/01/17  Yes Parrett, Tammy S, NP  furosemide (LASIX) 40 MG tablet Take 1 tablet (40 mg total) by mouth every three (3) days as needed for fluid or edema. 02/16/17 04/10/17 Yes Florencia Reasons, MD  guaiFENesin (MUCINEX) 600 MG 12 hr tablet Take 1 tablet (600 mg total) by mouth 2 (two) times daily. 03/19/17  Yes Shelly Coss, MD  lidocaine-prilocaine (EMLA) cream Apply 1 application topically as needed (for port access).    Yes [provider]  loratadine (CLARITIN) 10 MG tablet Take 10 mg by mouth daily.   Yes [provider]  metoprolol tartrate (LOPRESSOR) 25 MG tablet Take 25 mg by mouth 2 (two) times daily.   Yes [provider]  ondansetron (ZOFRAN-ODT) 4 MG disintegrating tablet Take 4 mg by mouth every 8 (eight) hours as needed for nausea or vomiting.   Yes [provider]  Oxycodone HCl 10 MG  TABS Take 1 tablet (10 mg total) by mouth every 4 (four) hours as needed (or shortness of breath). Patient taking differently: Take 10 mg by mouth every 4 (four) hours as needed (for severe pain or shortness of breath).  04/08/17  Yes Curt Bears, MD  OXYGEN Inhale 2 L into the lungs continuous.   Yes [provider]  pantoprazole (PROTONIX) 40 MG tablet Take 1 tablet (40 mg total) by mouth 2 (two) times daily. 12/18/16  Yes Eugenie Filler, MD  polyethylene glycol [facility] Floria Raveling) packet Take 17 g by mouth daily as needed for mild constipation. 03/19/17  Yes Adhikari, Tamsen Meek, MD  senna-docusate (SENNA-PLUS) 8.6-50 MG tablet Take 1 tablet by mouth at bedtime.   Yes [provider]  bisoprolol (ZEBETA) 5 MG tablet Take 1 tablet (5 mg total) by mouth daily. Patient not taking: Reported on 04/10/2017 02/16/17   Florencia Reasons, MD  Ipratropium-Albuterol (COMBIVENT) 20-100 MCG/ACT AERS respimat Inhale 1 puff into the lungs every 6 (six) hours. Patient not taking: Reported on 04/07/2017 07/22/16   Curt Bears, MD  mometasone-formoterol [facility]) 100-5 MCG/ACT AERO Inhale 2 puffs into the  lungs 2 (two) times daily. Patient not taking: Reported on 04/10/2017 03/19/17   Shelly Coss, MD  tiotropium (SPIRIVA) 18 MCG inhalation capsule Place 1 capsule (18 mcg total) into inhaler and inhale daily. Patient not taking: Reported on 04/10/2017 03/20/17   Shelly Coss, MD    Family History Family History  Problem Relation Age of Onset  . Cancer Father   . Diabetes Mellitus II Mother     Social History Social History   Tobacco Use  . Smoking status: Current Some Day Smoker    Packs/day: 0.10    Years: 30.00    Pack years: 3.00    Types: Cigarettes  . Smokeless tobacco: Never Used  . Tobacco comment: 1-2 cig a day   Substance Use Topics  . Alcohol use: No    Frequency: Never  . Drug use: No     Allergies   Patient has no known allergies.   Review of Systems Review of  Systems  Constitutional: Positive for fatigue. Negative for chills and fever.  HENT: Negative for congestion and rhinorrhea.   Eyes: Negative for visual disturbance.  Respiratory: Positive for cough and shortness of breath. Negative for wheezing.   Cardiovascular: Negative for chest pain and leg swelling.  Gastrointestinal: Negative for abdominal pain, diarrhea, nausea and vomiting.  Genitourinary: Negative for dysuria and flank pain.  Musculoskeletal: Positive for arthralgias and back pain. Negative for neck pain and neck stiffness.  Skin: Negative for rash and wound.  Allergic/Immunologic: Negative for immunocompromised state.  Neurological: Positive for weakness. Negative for syncope and headaches.  All other systems reviewed and are negative.    Physical Exam Updated Vital Signs BP 128/79 (BP Location: Left Arm)   Pulse 100   Temp 98.4 F (36.9 C) (Oral)   Resp (!) 27   Ht 5\' 11"  (1.803 m)   Wt 57.6 kg (127 lb)   SpO2 94%   BMI 17.71 kg/m   Physical Exam  Constitutional: He is oriented to person, place, and time. He appears well-developed and well-nourished. No distress.  HENT:  Head: Normocephalic and atraumatic.  Dry mucous membranes  Eyes: Conjunctivae are normal.  Neck: Neck supple.  Cardiovascular: Regular rhythm and normal heart sounds. Tachycardia present. Exam reveals no friction rub.  No murmur heard. Pulmonary/Chest: Effort normal. No respiratory distress. He has no wheezes. He has rales in the right lower field and the left lower field.  Abdominal: He exhibits no distension.  Musculoskeletal: He exhibits edema (Bilateral, 2+, pitting).  Neurological: He is alert and oriented to person, place, and time. He exhibits normal muscle tone.  Skin: Skin is warm. Capillary refill takes less than 2 seconds.  Psychiatric: He has a normal mood and affect.  Nursing note and vitals reviewed.   Spine Exam: Inspection/Palpation: Moderate tenderness across midline lower  lumbar spine.  Chronic radiation changes.  No deformity. Strength: 5/5 throughout LE bilaterally (hip flexion/extension, adduction/abduction; knee flexion/extension; foot dorsiflexion/plantarflexion, inversion/eversion; great toe inversion) Sensation: Intact to light touch in proximal and distal LE bilaterally Reflexes: 2+ quadriceps and achilles reflexes  Labs (all labs ordered are listed, but only abnormal results are displayed) Labs Reviewed  CBC WITH DIFFERENTIAL/PLATELET - Abnormal; Notable for the following components:      Result Value   RBC 2.40 (*)    Hemoglobin 8.1 (*)    HCT 24.5 (*)    MCV 102.1 (*)    Platelets 82 (*)    All other components within normal limits  COMPREHENSIVE METABOLIC PANEL -  Abnormal; Notable for the following components:   Chloride 94 (*)    Glucose, Bld 117 (*)    Creatinine, Ser 2.01 (*)    Albumin 2.6 (*)    ALT 14 (*)    GFR calc non Af Amer 35 (*)    GFR calc Af Amer 41 (*)    All other components within normal limits  CULTURE, BLOOD (ROUTINE X 2)  CULTURE, BLOOD (ROUTINE X 2)  TROPONIN I  BRAIN NATRIURETIC PEPTIDE  URINALYSIS, ROUTINE W REFLEX MICROSCOPIC    EKG  EKG Interpretation  Date/Time:    Ventricular Rate:    PR Interval:    QRS Duration:   QT Interval:    QTC Calculation:   R Axis:     Text Interpretation:          Radiology Dg Chest 2 View  Result Date: 04/10/2017 CLINICAL DATA:  Cough and  shortness of breath EXAM: CHEST - 2 VIEW COMPARISON:  Chest radiograph 03/17/2017 FINDINGS: Right anterior chest wall Port-A-Cath is present with tip projecting over the right atrium. Stable cardiac and mediastinal contours. Interval development of right-greater-than-left mid lower lung heterogeneous opacities. Small bilateral pleural effusions. IMPRESSION: New right mid and lower lung opacities with minimal left lung base opacities concerning for pneumonia. Small bilateral pleural effusions. Electronically Signed   By: Lovey Newcomer M.D.   On: 04/10/2017 10:33   Dg Lumbar Spine Complete  Result Date: 04/10/2017 CLINICAL DATA:  Low back pain EXAM: LUMBAR SPINE - COMPLETE 4+ VIEW COMPARISON:  None. FINDINGS: No fracture. No subluxation. Mild loss of disc height is seen at L3-4 and L4-5. There is minimal facet degeneration at lower lumbar levels. IMPRESSION: Minimal degenerative changes.  No acute findings. Electronically Signed   By: Misty Stanley M.D.   On: 04/10/2017 10:31    Procedures Procedures (including critical care time)  Medications Ordered in ED Medications  sodium chloride 0.9 % bolus 1,000 mL (has no administration in time range)  HYDROmorphone (DILAUDID) injection 1 mg (1 mg Intravenous Given 04/10/17 0347)     Initial Impression / Assessment and Plan / ED Course  I have reviewed the triage vital signs and the nursing notes.  Pertinent labs & imaging results that were available during my care of the patient were reviewed by me and considered in my medical decision making (see chart for details).  Clinical Course as of Apr 10 1037  Thu Apr 10, 2017  0908 Mild tachycardia, ? Pain versus dehydration  Pulse Rate(!): 104 [CI]  0909 On baseline O2  SpO2: 97 % [CI]  0926 Concern for uncontrolled pain 2/2 metastatic CA - likely will need admission for this. Also with gen weakness, cough, and lightheadedness on standing. Will check CXR given b/l rales for eval of PNA. Will also check basic labs, orthostatics for dehydration. Will hold on IVF at this time given bilateral leg edema, pending lab results/CXR.   [CI]  1008 Comprehensive metabolic panel(!) [CI]  4259 Remains elevated, but slightly decreased from value several days ago.  Creatinine(!): 2.01 [CI]  1037 Reviewed by myself, concerning for pneumonia.  Will start IV antibiotics for healthcare associated pneumonia.  DG Chest 2 View [CI]  1038 Hemoglobin stable from several days ago.  Left shift noted.  CBC with Differential(!) [CI]    Clinical  Course User Index [CI] Duffy Bruce, MD    57 year old male with past medical history as above here with generalized weakness.  Labs and imaging reviewed as above.  Patient with bilateral pneumonia and generalized weakness with increasing pain.  IV antibiotics, fluids given.  Admit to medicine.  I placed a call to Dr. Earlie Server to make him aware.  Final Clinical Impressions(s) / ED Diagnoses   Final diagnoses:  HCAP (healthcare-associated pneumonia)  Cancer associated pain    ED Discharge Orders    None       Duffy Bruce, MD 04/10/17 1039

## 2017-04-11 DIAGNOSIS — I10 Essential (primary) hypertension: Secondary | ICD-10-CM

## 2017-04-11 LAB — COMPREHENSIVE METABOLIC PANEL
ALT: 12 U/L — AB (ref 17–63)
AST: 20 U/L (ref 15–41)
Albumin: 2.3 g/dL — ABNORMAL LOW (ref 3.5–5.0)
Alkaline Phosphatase: 98 U/L (ref 38–126)
Anion gap: 9 (ref 5–15)
BILIRUBIN TOTAL: 0.2 mg/dL — AB (ref 0.3–1.2)
BUN: 15 mg/dL (ref 6–20)
CHLORIDE: 101 mmol/L (ref 101–111)
CO2: 29 mmol/L (ref 22–32)
CREATININE: 1.73 mg/dL — AB (ref 0.61–1.24)
Calcium: 8.8 mg/dL — ABNORMAL LOW (ref 8.9–10.3)
GFR calc Af Amer: 49 mL/min — ABNORMAL LOW (ref >60)
GFR, EST NON AFRICAN AMERICAN: 42 mL/min — AB (ref >60)
Glucose, Bld: 104 mg/dL — ABNORMAL HIGH (ref 65–99)
Potassium: 4.2 mmol/L (ref 3.5–5.1)
Sodium: 139 mmol/L (ref 135–145)
TOTAL PROTEIN: 6.1 g/dL — AB (ref 6.5–8.1)

## 2017-04-11 LAB — CBC WITH DIFFERENTIAL/PLATELET
Basophils Absolute: 0 10*3/uL (ref 0.0–0.1)
Basophils Relative: 0 %
EOS PCT: 1 %
Eosinophils Absolute: 0.1 10*3/uL (ref 0.0–0.7)
HEMATOCRIT: 21.1 % — AB (ref 39.0–52.0)
Hemoglobin: 7 g/dL — ABNORMAL LOW (ref 13.0–17.0)
LYMPHS ABS: 0.3 10*3/uL — AB (ref 0.7–4.0)
Lymphocytes Relative: 6 %
MCH: 34.7 pg — AB (ref 26.0–34.0)
MCHC: 33.2 g/dL (ref 30.0–36.0)
MCV: 104.5 fL — ABNORMAL HIGH (ref 78.0–100.0)
MONO ABS: 1.4 10*3/uL — AB (ref 0.1–1.0)
Monocytes Relative: 24 %
NEUTROS ABS: 3.9 10*3/uL (ref 1.7–7.7)
Neutrophils Relative %: 69 %
Platelets: 106 10*3/uL — ABNORMAL LOW (ref 150–400)
RBC: 2.02 MIL/uL — AB (ref 4.22–5.81)
RDW: 14.8 % (ref 11.5–15.5)
WBC: 5.7 10*3/uL (ref 4.0–10.5)

## 2017-04-11 LAB — RESPIRATORY PANEL BY PCR
ADENOVIRUS-RVPPCR: NOT DETECTED
Bordetella pertussis: NOT DETECTED
CORONAVIRUS 229E-RVPPCR: NOT DETECTED
CORONAVIRUS NL63-RVPPCR: NOT DETECTED
CORONAVIRUS OC43-RVPPCR: NOT DETECTED
Chlamydophila pneumoniae: NOT DETECTED
Coronavirus HKU1: NOT DETECTED
Influenza A: NOT DETECTED
Influenza B: NOT DETECTED
MYCOPLASMA PNEUMONIAE-RVPPCR: NOT DETECTED
Metapneumovirus: NOT DETECTED
PARAINFLUENZA VIRUS 1-RVPPCR: NOT DETECTED
Parainfluenza Virus 2: NOT DETECTED
Parainfluenza Virus 3: NOT DETECTED
Parainfluenza Virus 4: NOT DETECTED
Respiratory Syncytial Virus: NOT DETECTED
Rhinovirus / Enterovirus: NOT DETECTED

## 2017-04-11 LAB — PREPARE RBC (CROSSMATCH)

## 2017-04-11 LAB — PROCALCITONIN: PROCALCITONIN: 1.89 ng/mL

## 2017-04-11 LAB — HIV ANTIBODY (ROUTINE TESTING W REFLEX): HIV Screen 4th Generation wRfx: NONREACTIVE

## 2017-04-11 LAB — MRSA PCR SCREENING: MRSA by PCR: NEGATIVE

## 2017-04-11 MED ORDER — SODIUM CHLORIDE 0.9% FLUSH
10.0000 mL | Freq: Two times a day (BID) | INTRAVENOUS | Status: DC
  Administered 2017-04-11: 10 mL

## 2017-04-11 MED ORDER — ARFORMOTEROL TARTRATE 15 MCG/2ML IN NEBU
15.0000 ug | INHALATION_SOLUTION | Freq: Two times a day (BID) | RESPIRATORY_TRACT | Status: DC
  Administered 2017-04-11 – 2017-04-17 (×10): 15 ug via RESPIRATORY_TRACT
  Filled 2017-04-11 (×13): qty 2

## 2017-04-11 MED ORDER — ALPRAZOLAM 0.25 MG PO TABS
0.2500 mg | ORAL_TABLET | Freq: Two times a day (BID) | ORAL | Status: DC | PRN
  Administered 2017-04-11: 0.25 mg via ORAL
  Filled 2017-04-11: qty 1

## 2017-04-11 MED ORDER — SODIUM CHLORIDE 0.9 % IV SOLN
Freq: Once | INTRAVENOUS | Status: AC
  Administered 2017-04-11: 13:00:00 via INTRAVENOUS

## 2017-04-11 MED ORDER — ENSURE ENLIVE PO LIQD
237.0000 mL | Freq: Two times a day (BID) | ORAL | Status: DC
  Administered 2017-04-15 – 2017-04-17 (×2): 237 mL via ORAL

## 2017-04-11 MED ORDER — BUDESONIDE 0.25 MG/2ML IN SUSP
0.2500 mg | Freq: Two times a day (BID) | RESPIRATORY_TRACT | Status: DC
  Administered 2017-04-11 – 2017-04-13 (×4): 0.25 mg via RESPIRATORY_TRACT
  Filled 2017-04-11 (×4): qty 2

## 2017-04-11 MED ORDER — LORAZEPAM 1 MG PO TABS
1.0000 mg | ORAL_TABLET | Freq: Four times a day (QID) | ORAL | Status: DC | PRN
  Administered 2017-04-11 – 2017-04-14 (×12): 1 mg via ORAL
  Filled 2017-04-11 (×12): qty 1

## 2017-04-11 MED ORDER — SODIUM CHLORIDE 0.9% FLUSH
10.0000 mL | INTRAVENOUS | Status: DC | PRN
  Administered 2017-04-15: 10 mL
  Filled 2017-04-11: qty 40

## 2017-04-11 MED ORDER — METHYLPREDNISOLONE SODIUM SUCC 125 MG IJ SOLR
60.0000 mg | Freq: Three times a day (TID) | INTRAMUSCULAR | Status: DC
  Administered 2017-04-11 – 2017-04-13 (×7): 60 mg via INTRAVENOUS
  Filled 2017-04-11 (×7): qty 2

## 2017-04-11 NOTE — Telephone Encounter (Signed)
Spoke to pt and attempted to relay below message.  Pt stated he is currently at the ED and can not talk at this time.  Pt requested a call back Monday morning.  Will call back.

## 2017-04-11 NOTE — Telephone Encounter (Signed)
Called and spoke to [place] at [facility].  The prior authorization for Trelegy was denied.  Patient needs to trail and fail 2 out of these 3 medications: Symbicort, Advair, and Dulera. Patient has previously failed Dulera and Spiriva.  Tammy Parrett please advise.  Routing to [place].

## 2017-04-11 NOTE — Telephone Encounter (Signed)
Per TP: okay to try Symbicort 160 2puffs twice daily, rinse after use.  Please have him contact the office to let us know how the Symbicort is working for him so that we can try Trelegy again if the Symbicort is not managing his symptoms.  Thanks.

## 2017-04-11 NOTE — Progress Notes (Signed)
Initial Nutrition Assessment  DOCUMENTATION CODES:   Underweight  INTERVENTION:   Continue Ensure Enlive po BID, each supplement provides 350 kcal and 20 grams of protein   NUTRITION DIAGNOSIS:   Increased nutrient needs related to cancer and cancer related treatments as evidenced by estimated needs.   GOAL:   Patient will meet greater than or equal to 90% of their needs   MONITOR:   PO intake, Supplement acceptance, Weight trends  REASON FOR ASSESSMENT:   Consult Assessment of nutrition requirement/status  ASSESSMENT:   57 yo male admitted from nursing home on 3/21 with dysnpnea secondary to nosocomial pneumonia with history significant of stage IV adenocarcinoma of the lung with metastases to spine (on chemo), anemia, thrombocytopenia, COPD on 2-3L O2, CKD3, seizures, HTN, tobacco abuse. PMH erosive gastropathy, gastritis   Per MD note 3/21:  --Chest x-ray suggestive of right middle and right lower lobe pneumonia -Patient is an immunocompromised state and is currently on chemotherapy.   --Plan is to treat pneumonia with antibiotics and provide supportive care.    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 Would repeat vague answers when questions were posed in different ways, would not make eye contact.   Pt refused the NFPE   Medications:  Protonix, senokot, NaCl @ 100 ml/hr, vancomycin  Labs reviewed.  Weight history suggests pt's weight has  been relatively stable; however weight history may be subjectively unreliable d/t fluid fluctuation secondary to COPD and dehydration.  However, pt is at increased risk of wt loss at this time d/t active cancer treatment regimine.  Wt Readings from Last 15 Encounters:  04/10/17 127 lb (57.6 kg)  04/01/17 127 lb (57.6 kg)  03/25/17 132 lb 1.6 oz (59.9 kg)  03/20/17 126 lb (57.2 kg)  03/09/17 126 lb 4.8 oz (57.3 kg)  03/01/17 120 lb (54.4 kg)  02/10/17 123 lb 0.3 oz (55.8 kg)  02/07/17 128 lb 14.4 oz (58.5 kg)  01/17/17 131 lb 4 oz (59.5 kg)  01/09/17 128 lb 9.6 oz (58.3 kg)  12/30/16 117 lb (53.1 kg)  12/24/16 117 lb 12.8 oz (53.4 kg)  12/16/16 133 lb 9.6 oz (60.6 kg)  12/11/16 141 lb 12.8 oz (64.3 kg)  11/20/16 134 lb 9.6 oz (61.1 kg)     NUTRITION - FOCUSED PHYSICAL EXAM: Pt refused.   Diet Order:  Diet regular Room service appropriate? Yes; Fluid consistency: Thin  EDUCATION NEEDS:   No education needs have been identified at this time  Skin:  Skin Assessment: Reviewed RN Assessment  Last BM:  3/20  Height:   Ht Readings from Last 1 Encounters:  04/10/17 5\' 11"  (1.803 m)    Weight:   Wt Readings from Last 1 Encounters:  04/10/17 127 lb (57.6 kg)    Ideal Body Weight:  78.1 kg  BMI:  Body mass index is 17.71 kg/m.  Estimated Nutritional Needs:   Kcal:  1,900-2,100 kcal (33-36 kcal/kg rounded; active cancer therapy)  Protein:  95-105 grams (  20% kcal)  Fluid:  >=1.9 L/day     Edmonia Lynch Dietetic Intern Pager: [phone]

## 2017-04-11 NOTE — Progress Notes (Signed)
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  Patient is from nursing home facility.  ED evaluation he noted to have increasing oxygen requirement from his baseline.  663 shows pneumonia on the right middle lobe and left lower lobe.  Patient was admitted with working diagnosis of healthcare associated pneumonia and started on broad-spectrum antibiotics.  Also was noted to have elevated creatinine for which he was started on gentle hydration  Subjective: Patient seen and examined, he continues to have difficulty breathing.  Getting short of breath when speaking.  Complaining of abdominal pain as well.  Assessment & Plan: Acute on chronic respiratory failure due to healthcare associated pneumonia multifocal.  Multifactorial causes contributing adenocarcinoma of left lung, pneumonia and COPD Will continue cefepime and vancomycin for at least 1 more day, check MRSA.  Continue supplemental oxygen as needed to keep saturations above 89%.  Continue supportive treatment.  Incentive spirometry.  Will add Solu-Medrol 60 milligrams every 8 hours.  Will change MDIs to nebulizer treatment with Brovana and Pulmicort.  Continue albuterol as needed.  COPD exacerbation - O2 dependent Actively smoking Not compliant with oxygen supplementation at home, he was instructed to use oxygen continues but only use it intermittently. 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 Earlie Server as an  outpatient he is currently on maintenance chemotherapy  Anemia and thrombocytopenia Likely bone marrow suppression from chemotherapy Hemoglobin 7.0 today we will transfuse 1 unit for symptom improvement Continue to monitor closely  DVT prophylaxis: Heparin SQ Code Status: Full code Family Communication: None at bedside Disposition Plan: Back to SNF when respiratory status is back to baseline  Consultants:   Oncology  Procedures:   None  Antimicrobials: Anti-infectives (From admission, onward)   Start     Dose/Rate Route Frequency Ordered Stop   04/11/17 0800  vancomycin (VANCOCIN) 1,250 mg in sodium chloride 0.9 % 250 mL IVPB     1,250 mg 166.7 mL/hr over 90 Minutes Intravenous Every 48 hours 04/10/17 1218     04/10/17 1400  ceFEPIme (MAXIPIME) 1 g in sodium chloride 0.9 % 100 mL IVPB  Status:  Discontinued     1 g 200 mL/hr over 30 Minutes Intravenous Every 8 hours 04/10/17 1147 04/10/17 1152   04/10/17 1400  ceFEPIme (MAXIPIME) 1 g in sodium chloride 0.9 % 100 mL IVPB  Status:  Discontinued     1 g 200 mL/hr over 30 Minutes Intravenous Every 8 hours 04/10/17 1152 04/10/17 1155   04/10/17 1300  vancomycin (VANCOCIN) IVPB 1000 mg/200 mL premix     1,000 mg 200 mL/hr over 60 Minutes Intravenous  Once 04/10/17 1155 04/10/17 1521   04/10/17 1230  ceFEPIme (MAXIPIME) 1 g in sodium chloride 0.9 % 100 mL IVPB     1 g 200 mL/hr over 30 Minutes Intravenous Every 12 hours 04/10/17 1155         Objective: Vitals:   04/11/17 0518 04/11/17 0725 04/11/17 1118 04/11/17 1403  BP: (!) 101/57  (!) 109/58  Pulse: 78 78 90 88  Resp: 15 20  (!) 22  Temp: 97.7 F (36.5 C)     TempSrc: Oral     SpO2: 100% 100%  96%  Weight:      Height:        Intake/Output Summary (Last 24 hours) at 04/11/2017 1423 Last data filed at 04/11/2017 1327 Gross per 24 hour  Intake 1640 ml  Output 1050 ml  Net 590 ml   Filed Weights   04/10/17 0853  Weight: 57.6 kg (127 lb)     Examination:  General exam: Mild anxious HEENT: OP moist and clear Respiratory system: Decreased breath sounds bilaterally, right lower lobe  rales, diffuse expiratory wheezing.  Unable to complete sentences. Cardiovascular system: S1/S2 heard, RRR. No JVD, murmurs, rubs or gallops Gastrointestinal system: Abdomen soft, mild distention and epigastric tenderness + bowel sounds Central nervous system: Alert and oriented. No focal neurological deficits. Extremities: Bilateral lower extremity edema 1+ Skin: No rashes Psychiatry:  Mood & affect flat and anxious  Data Reviewed: I have personally reviewed following labs and imaging studies  CBC: Recent Labs  Lab 04/07/17 1954 04/10/17 0926 04/10/17 1835 04/11/17 0649  WBC 5.1 5.3 5.8 5.7  NEUTROABS 4.2 3.8  --  3.9  HGB 8.1* 8.1* 7.6* 7.0*  HCT 24.4* 24.5* 22.9* 21.1*  MCV 100.8* 102.1* 103.2* 104.5*  PLT 31* 82* 99* 102*   Basic Metabolic Panel: Recent Labs  Lab 04/07/17 1954 04/10/17 0926 04/10/17 1835 04/11/17 0649  NA 136 137  --  139  K 3.7 4.1  --  4.2  CL 94* 94*  --  101  CO2 32 32  --  29  GLUCOSE 92 117*  --  104*  BUN 22* 18  --  15  CREATININE 2.10* 2.01* 1.92* 1.73*  CALCIUM 8.8* 9.1  --  8.8*   GFR: Estimated Creatinine Clearance: 38.4 mL/min (A) (by C-G formula based on SCr of 1.73 mg/dL (H)). Liver Function Tests: Recent Labs  Lab 04/07/17 1954 04/10/17 0926 04/11/17 0649  AST 29 24 20   ALT 19 14* 12*  ALKPHOS 109 112 98  BILITOT 0.5 0.5 0.2*  PROT 6.7 6.7 6.1*  ALBUMIN 2.6* 2.6* 2.3*   No results for input(s): LIPASE, AMYLASE in the last 168 hours. No results for input(s): AMMONIA in the last 168 hours. Coagulation Profile: No results for input(s): INR, PROTIME in the last 168 hours. Cardiac Enzymes: Recent Labs  Lab 04/10/17 0926  TROPONINI <0.03   BNP (last 3 results) No results for input(s): PROBNP in the last 8760 hours. HbA1C: No results for input(s): HGBA1C in the last 72  hours. CBG: No results for input(s): GLUCAP in the last 168 hours. Lipid Profile: No results for input(s): CHOL, HDL, LDLCALC, TRIG, CHOLHDL, LDLDIRECT in the last 72 hours. Thyroid Function Tests: No results for input(s): TSH, T4TOTAL, FREET4, T3FREE, THYROIDAB in the last 72 hours. Anemia Panel: No results for input(s): VITAMINB12, FOLATE, FERRITIN, TIBC, IRON, RETICCTPCT in the last 72 hours. Sepsis Labs: Recent Labs  Lab 04/10/17 1103 04/11/17 1108  PROCALCITON  --  1.89  LATICACIDVEN 0.66  --     Recent Results (from the past 240 hour(s))  Blood culture (routine x 2)     Status: None (Preliminary result)   Collection Time: 04/10/17 10:57 AM  Result Value Ref Range Status   Specimen Description   Final    BLOOD LEFT ANTECUBITAL Performed at [facility] [address]  [place], [place] [address]    Special Requests   Final    BOTTLES DRAWN AEROBIC AND ANAEROBIC Blood Culture results may not be optimal due to an excessive volume of blood received in culture bottles Performed at [facility] [address] [place], [place] [address]    Culture   Final    NO GROWTH 1 DAY Performed at [facility], [place] [address] [place], [place] [address]    Report Status PENDING  Incomplete  Blood culture (routine x 2)     Status: None (Preliminary result)   Collection Time: 04/10/17 10:57 AM  Result Value Ref Range Status   Specimen Description   Final    BLOOD RIGHT PORTA CATH Performed at [facility] [address], [place], [place] [address]    Special Requests   Final    BOTTLES DRAWN AEROBIC AND ANAEROBIC Blood Culture adequate volume Performed at [facility] [address] [place], [place] [address]    Culture   Final    NO GROWTH 1 DAY Performed at [facility], [place] [address] [place], [place] [address]    Report Status PENDING  Incomplete  Respiratory Panel by PCR     Status:  None   Collection Time: 04/10/17  4:57 PM  Result Value Ref Range Status   Adenovirus NOT DETECTED NOT DETECTED Final   Coronavirus 229E NOT DETECTED NOT DETECTED Final   Coronavirus HKU1 NOT DETECTED NOT DETECTED Final   Coronavirus NL63 NOT DETECTED NOT DETECTED Final   Coronavirus OC43 NOT DETECTED NOT DETECTED Final   Metapneumovirus NOT DETECTED NOT DETECTED Final   Rhinovirus / Enterovirus NOT DETECTED NOT DETECTED Final   Influenza A NOT DETECTED NOT DETECTED Final   Influenza B NOT DETECTED NOT DETECTED Final   Parainfluenza Virus 1 NOT DETECTED NOT DETECTED Final   Parainfluenza Virus 2 NOT DETECTED NOT DETECTED Final   Parainfluenza Virus 3 NOT DETECTED NOT DETECTED Final   Parainfluenza Virus 4 NOT DETECTED NOT DETECTED Final   Respiratory Syncytial Virus NOT DETECTED NOT DETECTED Final   Bordetella pertussis NOT DETECTED NOT DETECTED Final   Chlamydophila pneumoniae NOT DETECTED NOT DETECTED Final   Mycoplasma pneumoniae NOT DETECTED NOT DETECTED Final    Comment: Performed at [facility], [place] [address], [place], [place] [address]      Radiology Studies: Dg Chest 2 View  Result Date: 04/10/2017 CLINICAL DATA:  Cough and  shortness of breath EXAM: CHEST - 2 VIEW COMPARISON:  Chest radiograph 03/17/2017 FINDINGS: Right anterior chest wall Port-A-Cath is present with tip projecting over the right atrium. Stable cardiac and mediastinal contours. Interval development of right-greater-than-left mid lower lung heterogeneous opacities. Small bilateral pleural effusions. IMPRESSION: New right mid and lower lung opacities with minimal left lung base opacities concerning for pneumonia. Small bilateral pleural effusions. Electronically Signed   By: Lovey Newcomer M.D.   On: 04/10/2017 10:33   Dg Lumbar Spine Complete  Result Date: 04/10/2017 CLINICAL DATA:  Low back pain EXAM: LUMBAR SPINE - COMPLETE 4+ VIEW COMPARISON:  None. FINDINGS: No fracture. No subluxation. Mild loss of  disc height is seen at L3-4 and L4-5. There is minimal facet degeneration at lower lumbar levels. IMPRESSION: Minimal degenerative changes.  No acute findings. Electronically Signed   By: Misty Stanley M.D.   On: 04/10/2017 10:31      Scheduled Meds: . amLODipine  10 mg Oral Daily  . arformoterol  15 mcg Nebulization BID  . budesonide (PULMICORT) nebulizer solution  0.25 mg Nebulization BID  . feeding supplement (ENSURE ENLIVE)  237 mL Oral BID BM  . guaiFENesin  600 mg Oral BID  . heparin  5,000 Units Subcutaneous Q8H  . ipratropium-albuterol  3 mL Nebulization TID  . loratadine  10 mg Oral Daily  . methylPREDNISolone (SOLU-MEDROL) injection  60 mg Intravenous Q8H  . metoprolol tartrate  25 mg Oral BID  . pantoprazole  40 mg Oral BID  . senna-docusate  1 tablet Oral QHS  . sodium chloride flush  10-40 mL Intracatheter Q12H   Continuous Infusions: . ceFEPime (MAXIPIME) IV Stopped (04/11/17 1149)  . vancomycin Stopped (04/11/17 1015)     LOS: 1 day    Time spent: Total of 25 minutes spent with pt, greater than 50% of which was spent in discussion of  treatment, counseling and coordination of care   Chipper Oman, MD Pager: Text Page via [email or web address]   If 7PM-7AM, please contact night-coverage [email or web address] 04/11/2017, 2:23 PM   Note - This record has been created using [employer]. Chart creation errors have been sought, but may not always have been located. Such creation errors do not reflect on the standard of medical care.

## 2017-04-11 NOTE — Progress Notes (Signed)
Continuous pulse ox began ringing out, entered room and found patient sitting up on the side of the bed with labored breaths. Stated that he tried to get up to use the bathroom and got short of breath. Continuous pulse oximetry reading 83% on 3L O2. Bumped oxygen up to 4L, sats rose to 88%. Administered duoneb, sats climbed to 100% while on treatment, after treatment moved patient back to his initial 3L O2, oxygen. Pt is currently saturating around 93%. Notified doctor of patient's anxiety with shortness of breath, 0.25 mg of xanax ordered and administered. Pt wishes to sit up on the side of bed but is no longer in obvious distress. Will continue to monitor closely at this time.

## 2017-04-12 LAB — CBC WITH DIFFERENTIAL/PLATELET
BASOS PCT: 0 %
Basophils Absolute: 0 10*3/uL (ref 0.0–0.1)
Eosinophils Absolute: 0 10*3/uL (ref 0.0–0.7)
Eosinophils Relative: 0 %
HEMATOCRIT: 24.8 % — AB (ref 39.0–52.0)
HEMOGLOBIN: 8.5 g/dL — AB (ref 13.0–17.0)
Lymphocytes Relative: 3 %
Lymphs Abs: 0.2 10*3/uL — ABNORMAL LOW (ref 0.7–4.0)
MCH: 34.1 pg — ABNORMAL HIGH (ref 26.0–34.0)
MCHC: 34.3 g/dL (ref 30.0–36.0)
MCV: 99.6 fL (ref 78.0–100.0)
Monocytes Absolute: 0.4 10*3/uL (ref 0.1–1.0)
Monocytes Relative: 5 %
NEUTROS ABS: 6.9 10*3/uL (ref 1.7–7.7)
NEUTROS PCT: 92 %
Platelets: 144 10*3/uL — ABNORMAL LOW (ref 150–400)
RBC: 2.49 MIL/uL — AB (ref 4.22–5.81)
RDW: 18.5 % — AB (ref 11.5–15.5)
WBC: 7.5 10*3/uL (ref 4.0–10.5)

## 2017-04-12 LAB — BPAM RBC
Blood Product Expiration Date: 201904122359
ISSUE DATE / TIME: 201903221519
Unit Type and Rh: 5100

## 2017-04-12 LAB — BASIC METABOLIC PANEL
ANION GAP: 9 (ref 5–15)
BUN: 14 mg/dL (ref 6–20)
CHLORIDE: 103 mmol/L (ref 101–111)
CO2: 27 mmol/L (ref 22–32)
Calcium: 9.1 mg/dL (ref 8.9–10.3)
Creatinine, Ser: 1.65 mg/dL — ABNORMAL HIGH (ref 0.61–1.24)
GFR calc Af Amer: 52 mL/min — ABNORMAL LOW (ref >60)
GFR, EST NON AFRICAN AMERICAN: 45 mL/min — AB (ref >60)
Glucose, Bld: 144 mg/dL — ABNORMAL HIGH (ref 65–99)
POTASSIUM: 4.6 mmol/L (ref 3.5–5.1)
SODIUM: 139 mmol/L (ref 135–145)

## 2017-04-12 LAB — TYPE AND SCREEN
ABO/RH(D): O POS
Antibody Screen: NEGATIVE
Unit division: 0

## 2017-04-12 LAB — MAGNESIUM: MAGNESIUM: 1.9 mg/dL (ref 1.7–2.4)

## 2017-04-12 LAB — LEGIONELLA PNEUMOPHILA SEROGP 1 UR AG: L. pneumophila Serogp 1 Ur Ag: NEGATIVE

## 2017-04-12 MED ORDER — ESCITALOPRAM OXALATE 10 MG PO TABS
5.0000 mg | ORAL_TABLET | Freq: Every day | ORAL | Status: DC
  Administered 2017-04-12 – 2017-04-16 (×5): 5 mg via ORAL
  Filled 2017-04-12 (×5): qty 1

## 2017-04-12 MED ORDER — SODIUM CHLORIDE 0.9 % IV SOLN
100.0000 mg | Freq: Two times a day (BID) | INTRAVENOUS | Status: DC
  Administered 2017-04-12 – 2017-04-17 (×10): 100 mg via INTRAVENOUS
  Filled 2017-04-12 (×10): qty 100

## 2017-04-12 MED ORDER — IPRATROPIUM-ALBUTEROL 0.5-2.5 (3) MG/3ML IN SOLN
3.0000 mL | RESPIRATORY_TRACT | Status: DC
  Administered 2017-04-12 – 2017-04-14 (×11): 3 mL via RESPIRATORY_TRACT
  Filled 2017-04-12 (×12): qty 3

## 2017-04-12 MED ORDER — SODIUM CHLORIDE 3 % IN NEBU
4.0000 mL | INHALATION_SOLUTION | Freq: Two times a day (BID) | RESPIRATORY_TRACT | Status: DC
  Administered 2017-04-12: 4 mL via RESPIRATORY_TRACT
  Filled 2017-04-12 (×2): qty 4

## 2017-04-12 NOTE — Progress Notes (Signed)
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  [name] is from nursing home facility.  ED evaluation he noted to have increasing oxygen requirement from his baseline.  663 shows pneumonia on the right middle lobe and left lower lobe.  [name] was admitted with working diagnosis of healthcare associated pneumonia and started on broad-spectrum antibiotics.  Also was noted to have elevated creatinine for which he was started on gentle hydration  Subjective: [name] seen and examined, he is doing slightly better today.  He continues to be anxious and getting panic attacks due to being unable to take a good deep breath.  Assessment & Plan: Acute on chronic respiratory failure due to healthcare associated pneumonia multifocal. Multifactorial causes contributing adenocarcinoma of left lung, pneumonia and COPD On cefepime and vancomycin, MRSA is negative will discontinue vancomycin, de-escalate therapy switch to doxycycline.  Continue supplemental oxygen as needed to keep saturations above 89%.  Continue supportive treatment.  Incentive spirometry.  Continue Solu-Medrol, Brovana, Pulmicort and nebulizers as needed.  COPD exacerbation - O2 dependent -slow improvement Actively smoking Not compliant with oxygen supplementation at home, he was instructed to use oxygen continues but only use it intermittently. See above  Acute on chronic kidney disease stage III Prerenal, Cr Continues to improve baseline around1.4 Can d/c IVF for now. Cr 1.6 today   Monitor BMP Adenocarcinoma of left lung, stage IV with metastasis to bone Follows Dr. Earlie Server as an  outpatient he is currently on maintenance chemotherapy  Anemia and thrombocytopenia Likely bone marrow suppression from chemotherapy Status post 1 unit of PRBCs, hemoglobin stable today. Continue to monitor  DVT prophylaxis: Heparin SQ Code Status: Full code Family Communication: None at bedside Disposition Plan: Back to SNF when respiratory status is back to baseline  Consultants:   Oncology  Procedures:   None  Antimicrobials: Anti-infectives (From admission, onward)   Start     Dose/Rate Route Frequency Ordered Stop   04/11/17 0800  vancomycin (VANCOCIN) 1,250 mg in sodium chloride 0.9 % 250 mL IVPB     1,250 mg 166.7 mL/hr over 90 Minutes Intravenous Every 48 hours 04/10/17 1218     04/10/17 1400  ceFEPIme (MAXIPIME) 1 g in sodium chloride 0.9 % 100 mL IVPB  Status:  Discontinued     1 g 200 mL/hr over 30 Minutes Intravenous Every 8 hours 04/10/17 1147 04/10/17 1152   04/10/17 1400  ceFEPIme (MAXIPIME) 1 g in sodium chloride 0.9 % 100 mL IVPB  Status:  Discontinued     1 g 200 mL/hr over 30 Minutes Intravenous Every 8 hours 04/10/17 1152 04/10/17 1155   04/10/17 1300  vancomycin (VANCOCIN) IVPB 1000 mg/200 mL premix     1,000 mg 200 mL/hr over 60 Minutes Intravenous  Once 04/10/17 1155 04/10/17 1521   04/10/17 1230  ceFEPIme (MAXIPIME) 1 g in sodium chloride 0.9 % 100 mL IVPB     1 g 200 mL/hr over 30 Minutes Intravenous Every 12 hours 04/10/17 1155        Objective: Vitals:   04/12/17 0743 04/12/17 1354 04/12/17 1400 04/12/17 1446  BP: 119/65 131/73    Pulse: 87 89  Resp: 20 20    Temp: 97.9 F (36.6 C) (!) 97.5 F (36.4 C)    TempSrc: Oral Oral    SpO2: 100% 100% 100% 100%  Weight:      Height:        Intake/Output Summary (Last 24 hours) at 04/12/2017 1456 Last data filed at 04/12/2017 1300 Gross per 24 hour  Intake 1266 ml  Output 1300 ml  Net -34 ml   Filed Weights   04/10/17 0853  Weight: 57.6 kg (127 lb)    Examination:  General:  Anxious Cardiovascular: RRR, S1/S2 +, no rubs, no gallops Respiratory: Decreased air entry bilaterally, diffuse rhonchi with expiratory wheezing Abdominal: Soft, NT, ND, bowel sounds + Extremities: Bilateral lower extremity edema 1+  Data Reviewed: I have personally reviewed following labs and imaging studies  CBC: Recent Labs  Lab 04/07/17 1954 04/10/17 0926 04/10/17 1835 04/11/17 0649 04/12/17 0451  WBC 5.1 5.3 5.8 5.7 7.5  NEUTROABS 4.2 3.8  --  3.9 6.9  HGB 8.1* 8.1* 7.6* 7.0* 8.5*  HCT 24.4* 24.5* 22.9* 21.1* 24.8*  MCV 100.8* 102.1* 103.2* 104.5* 99.6  PLT 31* 82* 99* 106* 027*   Basic Metabolic Panel: Recent Labs  Lab 04/07/17 1954 04/10/17 0926 04/10/17 1835 04/11/17 0649 04/12/17 0451  NA 136 137  --  139 139  K 3.7 4.1  --  4.2 4.6  CL 94* 94*  --  101 103  CO2 32 32  --  29 27  GLUCOSE 92 117*  --  104* 144*  BUN 22* 18  --  15 14  CREATININE 2.10* 2.01* 1.92* 1.73* 1.65*  CALCIUM 8.8* 9.1  --  8.8* 9.1  MG  --   --   --   --  1.9   GFR: Estimated Creatinine Clearance: 40.2 mL/min (A) (by C-G formula based on SCr of 1.65 mg/dL (H)). Liver Function Tests: Recent Labs  Lab 04/07/17 1954 04/10/17 0926 04/11/17 0649  AST 29 24 20   ALT 19 14* 12*  ALKPHOS 109 112 98  BILITOT 0.5 0.5 0.2*  PROT 6.7 6.7 6.1*  ALBUMIN 2.6* 2.6* 2.3*   No results for input(s): LIPASE, AMYLASE in the last 168 hours. No results for input(s): AMMONIA in the last 168 hours. Coagulation Profile: No results for input(s): INR, PROTIME in the last 168 hours. Cardiac Enzymes: Recent Labs  Lab 04/10/17 0926  TROPONINI <0.03   BNP (last 3 results) No results for input(s): PROBNP in the last 8760 hours. HbA1C: No results for input(s): HGBA1C in the last 72 hours. CBG: No results for input(s): GLUCAP in the last 168 hours. Lipid Profile: No results for input(s): CHOL, HDL, LDLCALC, TRIG, CHOLHDL, LDLDIRECT in the last 72 hours. Thyroid Function Tests: No results for  input(s): TSH, T4TOTAL, FREET4, T3FREE, THYROIDAB in the last 72 hours. Anemia Panel: No results for input(s): VITAMINB12, FOLATE, FERRITIN, TIBC, IRON, RETICCTPCT in the last 72 hours. Sepsis Labs: Recent Labs  Lab 04/10/17 1103 04/11/17 1108  PROCALCITON  --  1.89  LATICACIDVEN 0.66  --     Recent Results (from the past 240 hour(s))  Blood culture (routine x 2)     Status: None (Preliminary result)   Collection Time: 04/10/17 10:57 AM  Result Value Ref Range Status   Specimen Description   Final    BLOOD LEFT ANTECUBITAL Performed at [facility] [address] [place], [place] [address]    Special Requests   Final    BOTTLES  DRAWN AEROBIC AND ANAEROBIC Blood Culture results may not be optimal due to an excessive volume of blood received in culture bottles Performed at [facility], [place] [address], [place], [place] [address]    Culture   Final    NO GROWTH 2 DAYS Performed at [facility] [address] [place], [place] [address]    Report Status PENDING  Incomplete  Blood culture (routine x 2)     Status: None (Preliminary result)   Collection Time: 04/10/17 10:57 AM  Result Value Ref Range Status   Specimen Description   Final    BLOOD RIGHT PORTA CATH Performed at [facility] [address], [place], [place] [address]    Special Requests   Final    BOTTLES DRAWN AEROBIC AND ANAEROBIC Blood Culture adequate volume Performed at [facility] [address] [place], [place] [address]    Culture   Final    NO GROWTH 2 DAYS Performed at [facility] [address], [place], [place] [address]    Report Status PENDING  Incomplete  Respiratory Panel by PCR     Status: None   Collection Time: 04/10/17  4:57 PM  Result Value Ref Range Status   Adenovirus NOT DETECTED NOT DETECTED Final   Coronavirus 229E NOT DETECTED NOT DETECTED Final   Coronavirus HKU1 NOT DETECTED NOT  DETECTED Final   Coronavirus NL63 NOT DETECTED NOT DETECTED Final   Coronavirus OC43 NOT DETECTED NOT DETECTED Final   Metapneumovirus NOT DETECTED NOT DETECTED Final   Rhinovirus / Enterovirus NOT DETECTED NOT DETECTED Final   Influenza A NOT DETECTED NOT DETECTED Final   Influenza B NOT DETECTED NOT DETECTED Final   Parainfluenza Virus 1 NOT DETECTED NOT DETECTED Final   Parainfluenza Virus 2 NOT DETECTED NOT DETECTED Final   Parainfluenza Virus 3 NOT DETECTED NOT DETECTED Final   Parainfluenza Virus 4 NOT DETECTED NOT DETECTED Final   Respiratory Syncytial Virus NOT DETECTED NOT DETECTED Final   Bordetella pertussis NOT DETECTED NOT DETECTED Final   Chlamydophila pneumoniae NOT DETECTED NOT DETECTED Final   Mycoplasma pneumoniae NOT DETECTED NOT DETECTED Final    Comment: Performed at [facility], [place] [address], [place], [place] [address]  MRSA PCR Screening     Status: None   Collection Time: 04/11/17 11:38 AM  Result Value Ref Range Status   MRSA by PCR NEGATIVE NEGATIVE Final    Comment:        The GeneXpert MRSA Assay (FDA approved for NASAL specimens only), is one component of a comprehensive MRSA colonization surveillance program. It is not intended to diagnose MRSA infection nor to guide or monitor treatment for MRSA infections. Performed at [facility], [place] [address], [place], [place] [address]       Radiology Studies: No results found.    Scheduled Meds: . amLODipine  10 mg Oral Daily  . arformoterol  15 mcg Nebulization BID  . budesonide (PULMICORT) nebulizer solution  0.25 mg Nebulization BID  . feeding supplement (ENSURE ENLIVE)  237 mL Oral BID BM  . guaiFENesin  600 mg Oral BID  . heparin  5,000 Units Subcutaneous Q8H  . ipratropium-albuterol  3 mL Nebulization TID  . loratadine  10 mg Oral Daily  . methylPREDNISolone (SOLU-MEDROL) injection  60 mg Intravenous Q8H  . metoprolol tartrate  25 mg Oral BID  . pantoprazole   40 mg Oral BID  .  senna-docusate  1 tablet Oral QHS  . sodium chloride flush  10-40 mL Intracatheter Q12H   Continuous Infusions: . ceFEPime (MAXIPIME) IV 1 g (04/12/17 0955)  . vancomycin Stopped (04/11/17 1015)     LOS: 2 days    Time spent: Total of 25 minutes spent with pt, greater than 50% of which was spent in discussion of  treatment, counseling and coordination of care   Chipper Oman, MD Pager: Text Page via [email or web address]   If 7PM-7AM, please contact night-coverage [email or web address] 04/12/2017, 2:56 PM   Note - This record has been created using [employer]. Chart creation errors have been sought, but may not always have been located. Such creation errors do not reflect on the standard of medical care.

## 2017-04-13 ENCOUNTER — Inpatient Hospital Stay (HOSPITAL_COMMUNITY): Payer: Medicaid Other

## 2017-04-13 LAB — BASIC METABOLIC PANEL
Anion gap: 11 (ref 5–15)
BUN: 17 mg/dL (ref 6–20)
CALCIUM: 9.5 mg/dL (ref 8.9–10.3)
CHLORIDE: 104 mmol/L (ref 101–111)
CO2: 26 mmol/L (ref 22–32)
Creatinine, Ser: 1.59 mg/dL — ABNORMAL HIGH (ref 0.61–1.24)
GFR calc non Af Amer: 47 mL/min — ABNORMAL LOW (ref >60)
GFR, EST AFRICAN AMERICAN: 54 mL/min — AB (ref >60)
Glucose, Bld: 161 mg/dL — ABNORMAL HIGH (ref 65–99)
Potassium: 4.4 mmol/L (ref 3.5–5.1)
Sodium: 141 mmol/L (ref 135–145)

## 2017-04-13 LAB — CBC
HCT: 26.3 % — ABNORMAL LOW (ref 39.0–52.0)
Hemoglobin: 8.6 g/dL — ABNORMAL LOW (ref 13.0–17.0)
MCH: 32.6 pg (ref 26.0–34.0)
MCHC: 32.7 g/dL (ref 30.0–36.0)
MCV: 99.6 fL (ref 78.0–100.0)
Platelets: 204 10*3/uL (ref 150–400)
RBC: 2.64 MIL/uL — ABNORMAL LOW (ref 4.22–5.81)
RDW: 17.7 % — AB (ref 11.5–15.5)
WBC: 13.9 10*3/uL — ABNORMAL HIGH (ref 4.0–10.5)

## 2017-04-13 MED ORDER — BUDESONIDE 0.5 MG/2ML IN SUSP
0.5000 mg | Freq: Two times a day (BID) | RESPIRATORY_TRACT | Status: DC
  Administered 2017-04-13 – 2017-04-17 (×6): 0.5 mg via RESPIRATORY_TRACT
  Filled 2017-04-13 (×9): qty 2

## 2017-04-13 MED ORDER — BENZONATATE 100 MG PO CAPS
100.0000 mg | ORAL_CAPSULE | Freq: Three times a day (TID) | ORAL | Status: DC | PRN
  Administered 2017-04-13: 100 mg via ORAL
  Filled 2017-04-13: qty 1

## 2017-04-13 MED ORDER — HYDROCOD POLST-CPM POLST ER 10-8 MG/5ML PO SUER
5.0000 mL | Freq: Once | ORAL | Status: AC
  Administered 2017-04-13: 5 mL via ORAL
  Filled 2017-04-13: qty 5

## 2017-04-13 MED ORDER — METHYLPREDNISOLONE SODIUM SUCC 40 MG IJ SOLR
40.0000 mg | Freq: Two times a day (BID) | INTRAMUSCULAR | Status: DC
  Administered 2017-04-13 – 2017-04-15 (×4): 40 mg via INTRAVENOUS
  Filled 2017-04-13 (×4): qty 1

## 2017-04-13 MED ORDER — MORPHINE SULFATE (PF) 4 MG/ML IV SOLN
2.0000 mg | Freq: Once | INTRAVENOUS | Status: AC
  Administered 2017-04-13: 2 mg via INTRAVENOUS
  Filled 2017-04-13: qty 1

## 2017-04-13 MED ORDER — GUAIFENESIN-DM 100-10 MG/5ML PO SYRP
5.0000 mL | ORAL_SOLUTION | ORAL | Status: DC | PRN
  Administered 2017-04-13 – 2017-04-17 (×6): 5 mL via ORAL
  Filled 2017-04-13 (×6): qty 10

## 2017-04-13 NOTE — Progress Notes (Signed)
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  Patient is from nursing home facility.  ED evaluation he noted to have increasing oxygen requirement from his baseline.  663 shows pneumonia on the right middle lobe and left lower lobe.  Patient was admitted with working diagnosis of healthcare associated pneumonia and started on broad-spectrum antibiotics.  Also was noted to have elevated creatinine for which he was started on gentle hydration  Subjective: Patient seen and examined, continues to complain of shortness of breath especially with activity.  Wheezing has improved.  Denies chest pain.  No acute events overnight.  Assessment & Plan: Acute on chronic respiratory failure due to healthcare associated pneumonia multifocal. Multifactorial causes contributing adenocarcinoma of left lung, pneumonia and COPD Initially treated with cefepime and vancomycin, MRSA negative. Antibiotic therapy has been escalated to doxycycline.  We will continue for now.  Increasing WBC likely secondary to Solu-Medrol.  Will obtain CT chest as patient reported no improvement.  This may be worsening of cancer.  Continue Pulmicort and Brovana.  Discontinue hypertonic saline nebs.  Continue albuterol as needed.   COPD exacerbation - O2 dependent -slow improvement Actively smoking Not compliant with oxygen supplementation at home, he was instructed to use oxygen continues but only use it intermittently. 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 Earlie Server as an outpatient he is currently on maintenance chemotherapy  Anemia and thrombocytopenia Likely bone marrow suppression from chemotherapy Patient was transfused 1 unit of PRBCs, since then hemoglobin remained stable Monitor  DVT prophylaxis: Heparin SQ Code Status: Full code Family Communication: None at bedside Disposition Plan: Back to SNF when respiratory status is back to baseline  Consultants:   Oncology  Procedures:   None  Antimicrobials: Anti-infectives (From admission, onward)   Start     Dose/Rate Route Frequency Ordered Stop   04/12/17 1600  doxycycline (VIBRAMYCIN) 100 mg in sodium chloride 0.9 % 250 mL IVPB     100 mg 125 mL/hr over 120 Minutes Intravenous Every 12 hours 04/12/17 1505     04/11/17 0800  vancomycin (VANCOCIN) 1,250 mg in sodium chloride 0.9 % 250 mL IVPB  Status:  Discontinued     1,250 mg 166.7 mL/hr over 90 Minutes Intravenous Every 48 hours 04/10/17 1218 04/12/17 1505   04/10/17 1400  ceFEPIme (MAXIPIME) 1 g in sodium chloride 0.9 % 100 mL IVPB  Status:  Discontinued     1 g 200 mL/hr over 30 Minutes Intravenous Every 8 hours 04/10/17 1147 04/10/17 1152   04/10/17 1400  ceFEPIme (MAXIPIME) 1 g in sodium chloride 0.9 % 100 mL IVPB  Status:  Discontinued     1 g 200 mL/hr over 30 Minutes Intravenous Every 8 hours 04/10/17 1152 04/10/17 1155   04/10/17 1300  vancomycin (VANCOCIN) IVPB 1000 mg/200 mL premix     1,000 mg 200 mL/hr over 60 Minutes Intravenous  Once 04/10/17 1155 04/10/17 1521   04/10/17 1230  ceFEPIme (MAXIPIME) 1 g in sodium chloride 0.9 %  100 mL IVPB  Status:  Discontinued     1 g 200 mL/hr over 30 Minutes Intravenous Every 12 hours 04/10/17 1155 04/12/17 1505      Objective: Vitals:   04/12/17 2355 04/13/17 0015 04/13/17 0352 04/13/17 0532  BP:    120/76  Pulse:    93  Resp:    18  Temp:    97.9 F (36.6 C)  TempSrc:    Oral  SpO2: 98% 94% 97% 100%  Weight:       Height:        Intake/Output Summary (Last 24 hours) at 04/13/2017 1251 Last data filed at 04/13/2017 0541 Gross per 24 hour  Intake 690 ml  Output 1125 ml  Net -435 ml   Filed Weights   04/10/17 0853  Weight: 57.6 kg (127 lb)    Examination:  General: Pt is alert, awake, not in acute distress Cardiovascular: RRR, S1/S2 +, no rubs, no gallops Respiratory: Sounds diminished bilaterally, airway is clear, upper airway with wheezing and mild stridor Abdominal: Soft, NT, ND, bowel sounds + Extremities: Bilateral lower extremity trace edema  Data Reviewed: I have personally reviewed following labs and imaging studies  CBC: Recent Labs  Lab 04/07/17 1954 04/10/17 0926 04/10/17 1835 04/11/17 0649 04/12/17 0451 04/13/17 0354  WBC 5.1 5.3 5.8 5.7 7.5 13.9*  NEUTROABS 4.2 3.8  --  3.9 6.9  --   HGB 8.1* 8.1* 7.6* 7.0* 8.5* 8.6*  HCT 24.4* 24.5* 22.9* 21.1* 24.8* 26.3*  MCV 100.8* 102.1* 103.2* 104.5* 99.6 99.6  PLT 31* 82* 99* 106* 144* 500   Basic Metabolic Panel: Recent Labs  Lab 04/07/17 1954 04/10/17 0926 04/10/17 1835 04/11/17 0649 04/12/17 0451 04/13/17 0354  NA 136 137  --  139 139 141  K 3.7 4.1  --  4.2 4.6 4.4  CL 94* 94*  --  101 103 104  CO2 32 32  --  29 27 26   GLUCOSE 92 117*  --  104* 144* 161*  BUN 22* 18  --  15 14 17   CREATININE 2.10* 2.01* 1.92* 1.73* 1.65* 1.59*  CALCIUM 8.8* 9.1  --  8.8* 9.1 9.5  MG  --   --   --   --  1.9  --    GFR: Estimated Creatinine Clearance: 41.8 mL/min (A) (by C-G formula based on SCr of 1.59 mg/dL (H)). Liver Function Tests: Recent Labs  Lab 04/07/17 1954 04/10/17 0926 04/11/17 0649  AST 29 24 20   ALT 19 14* 12*  ALKPHOS 109 112 98  BILITOT 0.5 0.5 0.2*  PROT 6.7 6.7 6.1*  ALBUMIN 2.6* 2.6* 2.3*   No results for input(s): LIPASE, AMYLASE in the last 168 hours. No results for input(s): AMMONIA in the last 168 hours. Coagulation Profile: No results for input(s): INR, PROTIME in the last 168  hours. Cardiac Enzymes: Recent Labs  Lab 04/10/17 0926  TROPONINI <0.03   BNP (last 3 results) No results for input(s): PROBNP in the last 8760 hours. HbA1C: No results for input(s): HGBA1C in the last 72 hours. CBG: No results for input(s): GLUCAP in the last 168 hours. Lipid Profile: No results for input(s): CHOL, HDL, LDLCALC, TRIG, CHOLHDL, LDLDIRECT in the last 72 hours. Thyroid Function Tests: No results for input(s): TSH, T4TOTAL, FREET4, T3FREE, THYROIDAB in the last 72 hours. Anemia Panel: No results for input(s): VITAMINB12, FOLATE, FERRITIN, TIBC, IRON, RETICCTPCT in the last 72 hours. Sepsis Labs: Recent Labs  Lab 04/10/17 1103 04/11/17 1108  PROCALCITON  --  1.89  LATICACIDVEN 0.66  --     Recent Results (from the past 240 hour(s))  Blood culture (routine x 2)     Status: None (Preliminary result)   Collection Time: 04/10/17 10:57 AM  Result Value Ref Range Status   Specimen Description   Final    BLOOD LEFT ANTECUBITAL Performed at [facility] [address], [place], [place] [address]    Special Requests   Final    BOTTLES DRAWN AEROBIC AND ANAEROBIC Blood Culture results may not be optimal due to an excessive volume of blood received in culture bottles Performed at [facility] [address] [place], [place] [address]    Culture   Final    NO GROWTH 2 DAYS Performed at [facility] [address], [place], [place] [address]    Report Status PENDING  Incomplete  Blood culture (routine x 2)     Status: None (Preliminary result)   Collection Time: 04/10/17 10:57 AM  Result Value Ref Range Status   Specimen Description   Final    BLOOD RIGHT PORTA CATH Performed at [facility] [address], [place], [place] [address]    Special Requests   Final    BOTTLES DRAWN AEROBIC AND ANAEROBIC Blood Culture adequate volume Performed at [facility] [address]  [place], [place] [address]    Culture   Final    NO GROWTH 2 DAYS Performed at [facility] [address], [place], [place] [address]    Report Status PENDING  Incomplete  Respiratory Panel by PCR     Status: None   Collection Time: 04/10/17  4:57 PM  Result Value Ref Range Status   Adenovirus NOT DETECTED NOT DETECTED Final   Coronavirus 229E NOT DETECTED NOT DETECTED Final   Coronavirus HKU1 NOT DETECTED NOT DETECTED Final   Coronavirus NL63 NOT DETECTED NOT DETECTED Final   Coronavirus OC43 NOT DETECTED NOT DETECTED Final   Metapneumovirus NOT DETECTED NOT DETECTED Final   Rhinovirus / Enterovirus NOT DETECTED NOT DETECTED Final   Influenza A NOT DETECTED NOT DETECTED Final   Influenza B NOT DETECTED NOT DETECTED Final   Parainfluenza Virus 1 NOT DETECTED NOT DETECTED Final   Parainfluenza Virus 2 NOT DETECTED NOT DETECTED Final   Parainfluenza Virus 3 NOT DETECTED NOT DETECTED Final   Parainfluenza Virus 4 NOT DETECTED NOT DETECTED Final   Respiratory Syncytial Virus NOT DETECTED NOT DETECTED Final   Bordetella pertussis NOT DETECTED NOT DETECTED Final   Chlamydophila pneumoniae NOT DETECTED NOT DETECTED Final   Mycoplasma pneumoniae NOT DETECTED NOT DETECTED Final    Comment: Performed at [facility], [place] [address], [place], [place] [address]  MRSA PCR Screening     Status: None   Collection Time: 04/11/17 11:38 AM  Result Value Ref Range Status   MRSA by PCR NEGATIVE NEGATIVE Final    Comment:        The GeneXpert MRSA Assay (FDA approved for NASAL specimens only), is one component of a comprehensive MRSA colonization surveillance program. It is not intended to diagnose MRSA infection nor to guide or monitor treatment for MRSA infections. Performed at [facility], [place] [address] [place], [place] [address]       Radiology Studies: No results found.    Scheduled Meds: . amLODipine  10 mg Oral Daily  . arformoterol  15 mcg  Nebulization BID  . budesonide (PULMICORT) nebulizer solution  0.5 mg Nebulization BID  . escitalopram  5 mg Oral QHS  . feeding supplement (ENSURE ENLIVE)  237 mL Oral BID BM  . heparin  5,000 Units Subcutaneous Q8H  . ipratropium-albuterol  3 mL Nebulization Q4H  . loratadine  10 mg Oral Daily  . methylPREDNISolone (SOLU-MEDROL) injection  60 mg Intravenous Q8H  . metoprolol tartrate  25 mg Oral BID  . pantoprazole  40 mg Oral BID  . senna-docusate  1 tablet Oral QHS   Continuous Infusions: . doxycycline (VIBRAMYCIN) IV 100 mg (04/13/17 0448)     LOS: 3 days    Time spent: Total of 25 minutes spent with pt, greater than 50% of which was spent in discussion of  treatment, counseling and coordination of care   Chipper Oman, MD Pager: Text Page via [email or web address]   If 7PM-7AM, please contact night-coverage [email or web address] 04/13/2017, 12:51 PM   Note - This record has been created using [employer]. Chart creation errors have been sought, but may not always have been located. Such creation errors do not reflect on the standard of medical care.

## 2017-04-13 NOTE — NC FL2 (Addendum)
[place] LEVEL OF CARE SCREENING TOOL     IDENTIFICATION  Patient Name: [name] Birthdate: 08/29/1960 Sex: male Admission Date (Current Location): 04/10/2017  [facility] and [place] Number:  Herbalist and Address:  [facility],  [place], [place]      Provider Number: [id number]  Attending Physician Name and Address:  Patrecia Pour, Christean Grief, MD  Relative Name and Phone Number:       Current Level of Care: Hospital Recommended Level of Care: [place] Prior Approval Number:    Date Approved/Denied:   PASRR Number:    Discharge Plan: ALF    Current Diagnoses: Patient Active Problem List   Diagnosis Date Noted  . Chronic respiratory failure with hypoxia ([place]) 04/01/2017  . Leucocytosis 03/16/2017  . CKD (chronic kidney disease), stage III ([place]) 03/12/2017  . Malnutrition of moderate degree 03/10/2017  . COPD with acute exacerbation ([place]) 03/09/2017  . Anemia 02/07/2017  . Severe malnutrition ([place]) 01/03/2017  . DOE (dyspnea on exertion) 12/30/2016  . Nausea   . Hypervolemia   . HCAP (healthcare-associated pneumonia) 12/19/2016  . Dyspnea and respiratory abnormality 12/19/2016  . Erosive gastropathy 12/18/2016  . Gastritis 12/18/2016  . Hematemesis 12/16/2016  . Intractable nausea and vomiting 12/16/2016  . HTN (hypertension) 10/30/2016  . Chronic obstructive pulmonary disease ([place]) 09/02/2016  . COPD (chronic obstructive pulmonary disease) ([place]) 08/19/2016  . Dehydration 06/04/2016  . Abdominal pain 06/04/2016  . 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 Adenocarcinoma of left lung, stage 4 ([place]) 05/02/2016  . Goals of care, counseling/discussion 05/02/2016  . Encounter for antineoplastic chemotherapy 05/02/2016  . Tobacco abuse 05/30/2011  . Alcohol abuse 05/30/2011  . Seizure ([place]) 05/28/2011    Orientation RESPIRATION BLADDER Height & Weight     Self, Time, Situation, Place  O2(see DC  summary) Continent Weight: 127 lb (57.6 kg) Height:  5\' 11"  (180.3 cm)  BEHAVIORAL SYMPTOMS/MOOD NEUROLOGICAL BOWEL NUTRITION STATUS      Continent Diet(regular)  AMBULATORY STATUS COMMUNICATION OF NEEDS Skin   Supervision Verbally Normal                       Personal Care Assistance Level of Assistance  Bathing, Feeding, Dressing Bathing Assistance: Limited assistance Feeding assistance: Independent Dressing Assistance: Limited assistance     Functional Limitations Info  Sight, Hearing, Speech Sight Info: Adequate Hearing Info: Adequate Speech Info: Adequate    SPECIAL CARE FACTORS FREQUENCY                       Contractures Contractures Info: Not present    Additional Factors Info  Code Status, Allergies Code Status Info: Full Allergies Info: NKA           Current Medications (04/13/2017):  This is the current hospital active medication list Current Facility-Administered Medications  Medication Dose Route Frequency Provider Last Rate Last Dose  . alum & mag hydroxide-simeth (MAALOX/MYLANTA) 200-200-20 MG/5ML suspension 30 mL  30 mL Oral Q4H PRN Amin, Ankit Chirag, MD      . amLODipine (NORVASC) tablet 10 mg  10 mg Oral Daily Amin, Ankit Chirag, MD   10 mg at 04/13/17 1019  . arformoterol (BROVANA) nebulizer solution 15 mcg  15 mcg Nebulization BID Patrecia Pour, Christean Grief, MD   15 mcg at 04/13/17 [phone]  . budesonide (PULMICORT) nebulizer solution 0.5 mg  0.5 mg Nebulization BID Patrecia Pour, Christean Grief, MD      .  doxycycline (VIBRAMYCIN) 100 mg in sodium chloride 0.9 % 250 mL IVPB  100 mg Intravenous Q12H Patrecia Pour, Christean Grief, MD 125 mL/hr at 04/13/17 0448 100 mg at 04/13/17 0448  . escitalopram (LEXAPRO) tablet 5 mg  5 mg Oral QHS Patrecia Pour, Christean Grief, MD   5 mg at 04/12/17 2158  . feeding supplement (ENSURE ENLIVE) (ENSURE ENLIVE) liquid 237 mL  237 mL Oral BID BM Patrecia Pour, Christean Grief, MD      . furosemide (LASIX) tablet 40 mg  40 mg Oral Q3 days PRN Amin, Ankit  Chirag, MD      . guaiFENesin-dextromethorphan (ROBITUSSIN DM) 100-10 MG/5ML syrup 5 mL  5 mL Oral Q4H PRN Patrecia Pour, Christean Grief, MD      . heparin injection 5,000 Units  5,000 Units Subcutaneous Q8H Amin, Ankit Chirag, MD      . hydrocortisone (ANUSOL-HC) 2.5 % rectal cream 1 application  1 application Topical QID PRN Amin, Ankit Chirag, MD      . hydrocortisone cream 1 % 1 application  1 application Topical TID PRN Amin, Ankit Chirag, MD      . ipratropium-albuterol (DUONEB) 0.5-2.5 (3) MG/3ML nebulizer solution 3 mL  3 mL Nebulization Q2H PRN Amin, Ankit Chirag, MD   3 mL at 04/13/17 0014  . ipratropium-albuterol (DUONEB) 0.5-2.5 (3) MG/3ML nebulizer solution 3 mL  3 mL Nebulization Q4H Patrecia Pour, Christean Grief, MD   3 mL at 04/13/17 0840  . lidocaine-prilocaine (EMLA) cream 1 application  1 application Topical PRN Amin, Ankit Chirag, MD      . lip balm (CARMEX) ointment 1 application  1 application Topical PRN Amin, Ankit Chirag, MD      . loratadine (CLARITIN) tablet 10 mg  10 mg Oral Daily Amin, Ankit Chirag, MD   10 mg at 04/13/17 1019  . LORazepam (ATIVAN) tablet 1 mg  1 mg Oral Q6H PRN Patrecia Pour, Christean Grief, MD   1 mg at 04/13/17 0840  . [START ON 04/14/2017] methylPREDNISolone sodium succinate (SOLU-MEDROL) 40 mg/mL injection 40 mg  40 mg Intravenous Q12H Patrecia Pour, Christean Grief, MD      . metoprolol tartrate (LOPRESSOR) tablet 25 mg  25 mg Oral BID Amin, Ankit Chirag, MD   25 mg at 04/13/17 1019  . MUSCLE RUB CREA 1 application  1 application Topical PRN Amin, Ankit Chirag, MD      . ondansetron (ZOFRAN-ODT) disintegrating tablet 4 mg  4 mg Oral Q8H PRN Amin, Ankit Chirag, MD      . oxyCODONE (Oxy IR/ROXICODONE) immediate release tablet 10 mg  10 mg Oral Q4H PRN Amin, Ankit Chirag, MD   10 mg at 04/13/17 0840  . pantoprazole (PROTONIX) EC tablet 40 mg  40 mg Oral BID Amin, Ankit Chirag, MD   40 mg at 04/13/17 1019  . phenol (CHLORASEPTIC) mouth spray 1 spray  1 spray Mouth/Throat PRN Amin, Ankit  Chirag, MD      . polyethylene glycol (MIRALAX / GLYCOLAX) packet 17 g  17 g Oral Daily PRN Amin, Ankit Chirag, MD      . polyvinyl alcohol (LIQUIFILM TEARS) 1.4 % ophthalmic solution 1 drop  1 drop Both Eyes PRN Amin, Ankit Chirag, MD      . senna-docusate (Senokot-S) tablet 1 tablet  1 tablet Oral QHS Amin, Jeanella Flattery, MD   1 tablet at 04/12/17 2157  . sodium chloride (OCEAN) 0.65 % nasal spray 1 spray  1 spray Each Nare PRN Damita Lack, MD      .  sodium chloride flush (NS) 0.9 % injection 10-40 mL  10-40 mL Intracatheter PRN Patrecia Pour, Christean Grief, MD         Discharge Medications: STOP taking these medications   bisoprolol 5 MG tablet Commonly known as:  ZEBETA   mometasone-formoterol 100-5 MCG/ACT Aero Commonly known as:  DULERA   OXYGEN   tiotropium 18 MCG inhalation capsule Commonly known as:  SPIRIVA     TAKE these medications   acetaminophen 500 MG tablet Commonly known as:  TYLENOL Take 500 mg by mouth 2 (two) times daily as needed for mild pain or headache.   albuterol (2.5 MG/3ML) 0.083% nebulizer solution Commonly known as:  PROVENTIL 1 neb every 4-6 hours as needed for wheezing and shortness of breath What changed:    how much to take  how to take this  when to take this  reasons to take this  additional instructions   ALKA-SELTZER PLUS COLD PO Take 2 tablets by mouth at bedtime as needed (COUGH).   amLODipine 10 MG tablet Commonly known as:  NORVASC Take 10 mg by mouth daily.   dexamethasone 4 MG tablet Commonly known as:  DECADRON Take 4 mg by mouth as directed. Take 1 tablet twice daily the day before, the day of and the day after chemotherapy every 3 weeks.   doxycycline 100 MG tablet Commonly known as:  VIBRA-TABS Take 1 tablet (100 mg total) by mouth every 12 (twelve) hours for 5 days.   escitalopram 5 MG tablet Commonly known as:  LEXAPRO Take 1 tablet (5 mg total) by mouth at bedtime.   feeding supplement (ENSURE  ENLIVE) Liqd Take 237 mLs by mouth 3 (three) times daily between meals.   fluconazole 100 MG tablet Commonly known as:  DIFLUCAN Take 1 tablet (100 mg total) by mouth daily. What changed:  additional instructions   Fluticasone-Umeclidin-Vilant 100-62.5-25 MCG/INH Aepb Commonly known as:  TRELEGY ELLIPTA Inhale 1 puff into the lungs daily.   furosemide 40 MG tablet Commonly known as:  LASIX Take 1 tablet (40 mg total) by mouth daily. What changed:    when to take this  reasons to take this   guaiFENesin 600 MG 12 hr tablet Commonly known as:  MUCINEX Take 1 tablet (600 mg total) by mouth 2 (two) times daily.   guaiFENesin-dextromethorphan 100-10 MG/5ML syrup Commonly known as:  ROBITUSSIN DM Take 5 mLs by mouth every 4 (four) hours as needed for cough.   Ipratropium-Albuterol 20-100 MCG/ACT Aers respimat Commonly known as:  COMBIVENT Inhale 1 puff into the lungs every 6 (six) hours.   lidocaine-prilocaine cream Commonly known as:  EMLA Apply 1 application topically as needed (for port access).   loratadine 10 MG tablet Commonly known as:  CLARITIN Take 10 mg by mouth daily.   metoprolol tartrate 25 MG tablet Commonly known as:  LOPRESSOR Take 25 mg by mouth 2 (two) times daily.   ondansetron 4 MG disintegrating tablet Commonly known as:  ZOFRAN-ODT Take 4 mg by mouth every 8 (eight) hours as needed for nausea or vomiting.   Oxycodone HCl 10 MG Tabs Take 1 tablet (10 mg total) by mouth every 4 (four) hours as needed (or shortness of breath). What changed:  reasons to take this   pantoprazole 40 MG tablet Commonly known as:  PROTONIX Take 1 tablet (40 mg total) by mouth 2 (two) times daily.   polyethylene glycol packet Commonly known as:  MIRALAX / GLYCOLAX Take 17 g by mouth daily as needed  for mild constipation.   predniSONE 10 MG tablet Commonly known as:  DELTASONE Take 1 tablet (10 mg total) by mouth daily. Take 3 tablets for 2 days, 2  tablets for 2 days, 1 tablet for 2 days and then stop.   SENNA-PLUS 8.6-50 MG tablet Generic drug:  senna-docusate Take 1 tablet by mouth at bedtime.     Relevant Imaging Results:  Relevant Lab Results:   Additional Information SS#: [id number]  Geralynn Ochs, LCSW

## 2017-04-14 ENCOUNTER — Inpatient Hospital Stay (HOSPITAL_COMMUNITY): Payer: Medicaid Other

## 2017-04-14 ENCOUNTER — Encounter (HOSPITAL_COMMUNITY): Payer: Self-pay | Admitting: Radiology

## 2017-04-14 DIAGNOSIS — R0902 Hypoxemia: Secondary | ICD-10-CM

## 2017-04-14 DIAGNOSIS — J449 Chronic obstructive pulmonary disease, unspecified: Secondary | ICD-10-CM

## 2017-04-14 DIAGNOSIS — C3492 Malignant neoplasm of unspecified part of left bronchus or lung: Secondary | ICD-10-CM

## 2017-04-14 DIAGNOSIS — J90 Pleural effusion, not elsewhere classified: Secondary | ICD-10-CM

## 2017-04-14 LAB — CREATININE, SERUM
Creatinine, Ser: 1.6 mg/dL — ABNORMAL HIGH (ref 0.61–1.24)
GFR calc non Af Amer: 46 mL/min — ABNORMAL LOW (ref >60)
GFR, EST AFRICAN AMERICAN: 54 mL/min — AB (ref >60)

## 2017-04-14 LAB — CBC
HEMATOCRIT: 28.5 % — AB (ref 39.0–52.0)
Hemoglobin: 9.1 g/dL — ABNORMAL LOW (ref 13.0–17.0)
MCH: 32.9 pg (ref 26.0–34.0)
MCHC: 31.9 g/dL (ref 30.0–36.0)
MCV: 102.9 fL — AB (ref 78.0–100.0)
Platelets: 289 10*3/uL (ref 150–400)
RBC: 2.77 MIL/uL — ABNORMAL LOW (ref 4.22–5.81)
RDW: 17.3 % — ABNORMAL HIGH (ref 11.5–15.5)
WBC: 18.2 10*3/uL — AB (ref 4.0–10.5)

## 2017-04-14 LAB — PROCALCITONIN: PROCALCITONIN: 0.47 ng/mL

## 2017-04-14 LAB — BRAIN NATRIURETIC PEPTIDE: B Natriuretic Peptide: 155.4 pg/mL — ABNORMAL HIGH (ref 0.0–100.0)

## 2017-04-14 MED ORDER — IPRATROPIUM-ALBUTEROL 0.5-2.5 (3) MG/3ML IN SOLN
3.0000 mL | Freq: Four times a day (QID) | RESPIRATORY_TRACT | Status: DC
  Administered 2017-04-15 (×3): 3 mL via RESPIRATORY_TRACT
  Filled 2017-04-14 (×3): qty 3

## 2017-04-14 NOTE — Clinical Social Work Note (Signed)
Clinical Social Work Assessment  Patient Details  Name: [name] MRN: [id number] Date of Birth: 1960/10/09  Date of referral:  04/14/17               Reason for consult:  Facility Placement(From [facility])                Permission sought to share information with:  Case Freight forwarder, Chartered certified accountant granted to share information::  Yes, Verbal Permission Granted  Name::        Agency::  [facility]  Relationship::     Contact Information:     Housing/Transportation Living arrangements for the past 2 months:  [place] of Information:  Patient Patient Interpreter Needed:  None Criminal Activity/Legal Involvement Pertinent to Current Situation/Hospitalization:  No - Comment as needed Significant Relationships:  Parents Lives with:  Facility Resident Do you feel safe going back to the place where you live?  Yes Need for family participation in patient care:  No (Coment)  Care giving concerns:  Patient reports that he needs a rollator and portable oxygen at facility. Patient stated current oxygen tank does not last very long and he is limited where he can take it.    Social Worker assessment / plan:  LCSW consulted for facility placement.   Patient from [facility].   LCSW met with patient at bedside. No family present.   Patient reports that he has been living at [name] for the past 2.5 months. He states that his plan is to return. At the facility he reports that he ambulates without assistance, but feels that he needs a rollator to help hm ambulate. He stated that a walker does not help him.   Patient also complained about his current oxygen tank stating that it does not last very long. He stated that he needs a backpack type oxygen so he can go places.   Patient reported that he plans to return to [name] at [facility].   No PT evaluation at the time of assessment.   PLAN: Patient will return to ALF at dc.    Employment status:  Disabled (Comment on whether or not currently receiving Disability) Insurance information:  Medicaid In [place] PT Recommendations:  Not assessed at this time Information / Referral to community resources:     Patient/Family's Response to care:  Patient expressed concerns about needed equipment at the facility.   Patient/Family's Understanding of and Emotional Response to Diagnosis, Current Treatment, and Prognosis:  Patient is understanding of diagnosis and treatment plan. Patient feels that he is not getting the equipment that he needs at the facility to increase his independence.   Emotional Assessment Appearance:  Appears older than stated age Attitude/Demeanor/Rapport:    Affect (typically observed):  Frustrated Orientation:  Oriented to Self, Oriented to Place, Oriented to  Time, Oriented to Situation Alcohol / Substance use:    Psych involvement (Current and /or in the community):  No (Comment)  Discharge Needs  Concerns to be addressed:  No discharge needs identified Readmission within the last 30 days:  No Current discharge risk:  None Barriers to Discharge:  Continued Medical Work up   [employer], LCSW 04/14/2017, 10:01 AM

## 2017-04-14 NOTE — Progress Notes (Signed)
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  Patient is from nursing home facility.  ED evaluation he noted to have increasing oxygen requirement from his baseline.  663 shows pneumonia on the right middle lobe and left lower lobe.  Patient was admitted with working diagnosis of healthcare associated pneumonia and started on broad-spectrum antibiotics.  Also was noted to have elevated creatinine for which he was started on gentle hydration.   Subjective: Patient seen and examined, he continues to complain of shortness of breath especially with activity.  Still with some increasing oxygen requirement.  Denies chest pain.  Remains afebrile.  Overnight had a fall.   Assessment & Plan: Acute on chronic respiratory failure due to healthcare associated pneumonia multifocal. Multifactorial causes contributing adenocarcinoma of left lung, pneumonia and COPD Initially treated with cefepime and vancomycin, MRSA negative. Antibiotic therapy has been escalated to doxycycline. CT of the chest shows large right pleural effusion - PCCM consulted for possible thoracentesis, effusion could be malignant versus parapneumonic. Continue doxycycline, continue nebulizers and Solu-Medrol.   COPD exacerbation - O2 dependent -slow improvement Actively smoking Not compliant with oxygen supplementation at home, he was instructed to use oxygen continues but only use it intermittently. 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 Earlie Server as an outpatient he is currently on maintenance chemotherapy  Anemia and thrombocytopenia Likely bone marrow suppression from chemotherapy Patient was transfused 1 unit of PRBCs Hgb remains stable  Monitor  DVT prophylaxis: Heparin SQ Code Status: Full code Family Communication: None at bedside Disposition Plan: Back to SNF when respiratory status improve   Consultants:   Oncology  Procedures:   None  Antimicrobials: Anti-infectives (From admission, onward)   Start     Dose/Rate Route Frequency Ordered Stop   04/12/17 1600  doxycycline (VIBRAMYCIN) 100 mg in sodium chloride 0.9 % 250 mL IVPB     100 mg 125 mL/hr over 120 Minutes Intravenous Every 12 hours 04/12/17 1505     04/11/17 0800  vancomycin (VANCOCIN) 1,250 mg in sodium chloride 0.9 % 250 mL IVPB  Status:  Discontinued     1,250 mg 166.7 mL/hr over 90 Minutes Intravenous Every 48 hours 04/10/17 1218 04/12/17 1505   04/10/17 1400  ceFEPIme (MAXIPIME) 1 g in sodium chloride 0.9 % 100 mL IVPB  Status:  Discontinued     1 g 200 mL/hr over 30 Minutes Intravenous Every 8 hours 04/10/17 1147 04/10/17 1152   04/10/17 1400  ceFEPIme (MAXIPIME) 1 g in sodium chloride 0.9 % 100 mL IVPB  Status:  Discontinued     1 g 200 mL/hr over 30 Minutes Intravenous Every 8 hours 04/10/17 1152 04/10/17 1155   04/10/17 1300  vancomycin (VANCOCIN) IVPB 1000 mg/200 mL premix     1,000 mg 200 mL/hr over 60 Minutes Intravenous  Once 04/10/17 1155 04/10/17 1521   04/10/17 1230  ceFEPIme (MAXIPIME) 1 g in sodium chloride 0.9 % 100 mL IVPB  Status:  Discontinued  1 g 200 mL/hr over 30 Minutes Intravenous Every 12 hours 04/10/17 1155 04/12/17 1505      Objective: Vitals:   04/13/17 2054 04/13/17 2357 04/14/17 0420 04/14/17 0717  BP:    138/84  Pulse:    92  Resp:    (!) 22  Temp:    97.9 F (36.6 C)  TempSrc:    Oral  SpO2: 98% 100% 96% 100%  Weight:      Height:        Intake/Output  Summary (Last 24 hours) at 04/14/2017 1321 Last data filed at 04/14/2017 0505 Gross per 24 hour  Intake -  Output 1075 ml  Net -1075 ml   Filed Weights   04/10/17 0853  Weight: 57.6 kg (127 lb)    Examination:  General: Pt is alert, awake, not in acute distress Cardiovascular: RRR, S1/S2 +, no rubs, no gallops Respiratory: Decreased breath sounds bilaterally, right> left, mild diffuse expiratory wheezing Abdominal: Soft, NT, ND, bowel sounds + Extremities: No LE edema   Data Reviewed: I have personally reviewed following labs and imaging studies  CBC: Recent Labs  Lab 04/07/17 1954 04/10/17 0926 04/10/17 1835 04/11/17 0649 04/12/17 0451 04/13/17 0354 04/14/17 0443  WBC 5.1 5.3 5.8 5.7 7.5 13.9* 18.2*  NEUTROABS 4.2 3.8  --  3.9 6.9  --   --   HGB 8.1* 8.1* 7.6* 7.0* 8.5* 8.6* 9.1*  HCT 24.4* 24.5* 22.9* 21.1* 24.8* 26.3* 28.5*  MCV 100.8* 102.1* 103.2* 104.5* 99.6 99.6 102.9*  PLT 31* 82* 99* 106* 144* 204 388   Basic Metabolic Panel: Recent Labs  Lab 04/07/17 1954 04/10/17 0926 04/10/17 1835 04/11/17 0649 04/12/17 0451 04/13/17 0354 04/14/17 0443  NA 136 137  --  139 139 141  --   K 3.7 4.1  --  4.2 4.6 4.4  --   CL 94* 94*  --  101 103 104  --   CO2 32 32  --  29 27 26   --   GLUCOSE 92 117*  --  104* 144* 161*  --   BUN 22* 18  --  15 14 17   --   CREATININE 2.10* 2.01* 1.92* 1.73* 1.65* 1.59* 1.60*  CALCIUM 8.8* 9.1  --  8.8* 9.1 9.5  --   MG  --   --   --   --  1.9  --   --    GFR: Estimated Creatinine Clearance: 41.5 mL/min (A) (by C-G formula based on SCr of 1.6 mg/dL (H)). Liver Function Tests: Recent Labs  Lab 04/07/17 1954 04/10/17 0926 04/11/17 0649  AST 29 24 20   ALT 19 14* 12*  ALKPHOS 109 112 98  BILITOT 0.5 0.5 0.2*  PROT 6.7 6.7 6.1*  ALBUMIN 2.6* 2.6* 2.3*   No results for input(s): LIPASE, AMYLASE in the last 168 hours. No results for input(s): AMMONIA in the last 168 hours. Coagulation Profile: No results for input(s): INR,  PROTIME in the last 168 hours. Cardiac Enzymes: Recent Labs  Lab 04/10/17 0926  TROPONINI <0.03   BNP (last 3 results) No results for input(s): PROBNP in the last 8760 hours. HbA1C: No results for input(s): HGBA1C in the last 72 hours. CBG: No results for input(s): GLUCAP in the last 168 hours. Lipid Profile: No results for input(s): CHOL, HDL, LDLCALC, TRIG, CHOLHDL, LDLDIRECT in the last 72 hours. Thyroid Function Tests: No results for input(s): TSH, T4TOTAL, FREET4, T3FREE, THYROIDAB in the last 72 hours. Anemia Panel: No results for input(s):  VITAMINB12, FOLATE, FERRITIN, TIBC, IRON, RETICCTPCT in the last 72 hours. Sepsis Labs: Recent Labs  Lab 04/10/17 1103 04/11/17 1108  PROCALCITON  --  1.89  LATICACIDVEN 0.66  --     Recent Results (from the past 240 hour(s))  Blood culture (routine x 2)     Status: None (Preliminary result)   Collection Time: 04/10/17 10:57 AM  Result Value Ref Range Status   Specimen Description   Final    BLOOD LEFT ANTECUBITAL Performed at [facility] [address], [place], [place] [address]    Special Requests   Final    BOTTLES DRAWN AEROBIC AND ANAEROBIC Blood Culture results may not be optimal due to an excessive volume of blood received in culture bottles Performed at [facility] [address], [place], [place] [address]    Culture   Final    NO GROWTH 4 DAYS Performed at [facility], [place] [address], [place], [place] [address]    Report Status PENDING  Incomplete  Blood culture (routine x 2)     Status: None (Preliminary result)   Collection Time: 04/10/17 10:57 AM  Result Value Ref Range Status   Specimen Description   Final    BLOOD RIGHT PORTA CATH Performed at [facility] [address], [place], [place] [address]    Special Requests   Final    BOTTLES DRAWN AEROBIC AND ANAEROBIC Blood Culture adequate volume Performed at [facility] [address], [place], [place] [address]    Culture   Final    NO GROWTH 4 DAYS Performed at [facility], [place] [address], [place], [place] [address]    Report Status PENDING  Incomplete  Respiratory Panel by PCR     Status: None   Collection Time: 04/10/17  4:57 PM  Result Value Ref Range Status   Adenovirus NOT DETECTED NOT DETECTED Final   Coronavirus 229E NOT DETECTED NOT DETECTED Final   Coronavirus HKU1 NOT DETECTED NOT DETECTED Final   Coronavirus NL63 NOT DETECTED NOT DETECTED Final   Coronavirus OC43 NOT DETECTED NOT DETECTED Final   Metapneumovirus NOT DETECTED NOT DETECTED Final   Rhinovirus / Enterovirus NOT DETECTED NOT DETECTED Final   Influenza A NOT DETECTED NOT DETECTED Final   Influenza B NOT DETECTED NOT DETECTED Final   Parainfluenza Virus 1 NOT DETECTED NOT DETECTED Final   Parainfluenza Virus 2 NOT DETECTED NOT DETECTED Final   Parainfluenza Virus 3 NOT DETECTED NOT DETECTED Final   Parainfluenza Virus 4 NOT DETECTED NOT DETECTED Final   Respiratory Syncytial Virus NOT DETECTED NOT DETECTED Final   Bordetella pertussis NOT DETECTED NOT DETECTED Final   Chlamydophila pneumoniae NOT DETECTED NOT DETECTED Final   Mycoplasma pneumoniae NOT DETECTED NOT DETECTED Final    Comment: Performed at [facility], [place] [address] [place], [place] [address]  MRSA PCR Screening     Status: None   Collection Time: 04/11/17 11:38 AM  Result Value Ref Range Status   MRSA by PCR NEGATIVE NEGATIVE Final    Comment:        The GeneXpert MRSA Assay (FDA approved for NASAL specimens only), is one component of a comprehensive MRSA colonization surveillance program. It is not intended to diagnose MRSA infection nor to guide or monitor treatment for MRSA infections. Performed at [facility], [place] [address] [place], [place] [address]       Radiology Studies:  Ct Head Wo Contrast  Result Date: 04/14/2017 CLINICAL DATA:  [place] getting out  of bed. History of metastatic lung cancer. EXAM: CT HEAD WITHOUT CONTRAST TECHNIQUE: Contiguous axial images were obtained from the base of the skull through the vertex without intravenous contrast. COMPARISON:  05/28/2011 FINDINGS: Brain: No evidence of acute infarction, hemorrhage, extra-axial collection, ventriculomegaly, or mass effect. Generalized cerebral atrophy. Periventricular white matter low attenuation likely secondary to microangiopathy. Vascular: Cerebrovascular atherosclerotic calcifications are noted. Skull: Negative for fracture or focal lesion. Sinuses/Orbits: Visualized portions of the orbits are unremarkable. Mastoid sinuses are clear. Bilateral mucosal thickening in the maxillary sinuses. Other: None. IMPRESSION: No acute intracranial pathology. Electronically Signed   By: Kathreen Devoid   On: 04/14/2017 09:07   Ct Chest Wo Contrast  Result Date: 04/13/2017 CLINICAL DATA:  57 year old male with lung cancer. Shortness of breath. Possible pneumonia on recent chest radiographs 04/10/2017. EXAM: CT CHEST WITHOUT CONTRAST TECHNIQUE: Multidetector CT imaging of the chest was performed following the standard protocol without IV contrast. COMPARISON:  Chest radiographs 04/10/2017 and earlier. Chest CTA 03/13/2017, and earlier. FINDINGS: Cardiovascular: Stable cardiac size. No pericardial effusion. Right chest porta cath remains in place. Vascular patency is not evaluated in the absence of IV contrast. Mediastinum/Nodes: Stable appearance of the mediastinum since February in the absence of IV contrast today. Lungs/Pleura: Moderate size layering right pleural effusion has progressed since February with simple fluid density, and layering up to a depth of 5-6 centimeters in the right hemithorax. A small or trace left pleural effusion is new since February. Small volume retained secretions in the lower trachea. Otherwise the major airways are stable. Chronic emphysema. Spiculated opacity tracking along the  left major fissure to the left hilum appears stable since February measuring 15 x 31 millimeters at a comparable level (15 x 29 millimeters previously). Aside from increased compressive atelectasis in the right lung, ventilation is stable since 03/13/2017. Upper Abdomen: Stable visible upper abdominal viscera with persistent fluid along the celiac axis and gastrohepatic ligament. Musculoskeletal: Heterogeneous sclerosis in the lower thoracic vertebral bodies is stable. No new osseous abnormality identified. IMPRESSION: 1. Moderate layering right pleural effusion has increased since February. Increased compressive right lung atelectasis. A trace left pleural effusion has also increased. 2. Otherwise stable ventilation and CT appearance of the lungs since February, including Emphysema (ICD10-J43.9) and spiculated opacity along the left major fissure compatible with lung carcinoma. 3. Stable nonspecific fluid collection along the gastrohepatic ligament and celiac axis in the upper abdomen. Electronically Signed   By: Genevie Ann M.D.   On: 04/13/2017 23:05      Scheduled Meds: . amLODipine  10 mg Oral Daily  . arformoterol  15 mcg Nebulization BID  . budesonide (PULMICORT) nebulizer solution  0.5 mg Nebulization BID  . escitalopram  5 mg Oral QHS  . feeding supplement (ENSURE ENLIVE)  237 mL Oral BID BM  . heparin  5,000 Units Subcutaneous Q8H  . ipratropium-albuterol  3 mL Nebulization Q4H  . loratadine  10 mg Oral Daily  . methylPREDNISolone (SOLU-MEDROL) injection  40 mg Intravenous Q12H  . metoprolol tartrate  25 mg Oral BID  . pantoprazole  40 mg Oral BID  . senna-docusate  1 tablet Oral QHS   Continuous Infusions: . doxycycline (VIBRAMYCIN) IV Stopped (04/14/17 0519)     LOS: 4 days    Time spent: Total of 25 minutes spent with pt, greater than 50% of which was spent in discussion of  treatment, counseling and coordination of  care   Chipper Oman, MD Pager: Text Page via [email or web address]    If 7PM-7AM, please contact night-coverage [email or web address] 04/14/2017, 1:21 PM   Note - This record has been created using [employer]. Chart creation errors have been sought, but may not always have been located. Such creation errors do not reflect on the standard of medical care.

## 2017-04-14 NOTE — Progress Notes (Signed)
Pt called out, entered room and found patient sitting up on the side of the bed. Pt states that he fell trying to get up and hit his head. Pt has no obvious injury. Settled pt back into bed, instructed pt to not get up without assistance, turned bed alarm on. md notified, ct of head ordered.

## 2017-04-14 NOTE — Consult Note (Addendum)
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  Patient has recurrent effusion again.  Patient is a rather poor historian.  Denies fevers/chills, cough, production, lower ext edema or rigors.  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  Patient has recurrent effusion again.  Patient is a rather poor historian.  Denies fevers/chills, cough, production, lower ext edema or rigors.  PAST MEDICAL HISTORY :   has a past medical history of Abdominal pain (06/04/2016), Adenocarcinoma of left lung, stage 4 ([place]) (05/02/2016), Alcohol abuse, Bronchitis due to tobacco use ([place]), Cancer ([place]), COPD (chronic obstructive pulmonary disease) ([place]), Dehydration (06/04/2016), Encounter for antineoplastic chemotherapy (05/02/2016), Gastritis, Goals of care, counseling/discussion (05/02/2016), Hematemesis, HTN (hypertension) (10/30/2016), Recurrent upper respiratory infection (URI), Seizures ([place]) (05/2011), and Shortness of breath.  has a past surgical history that includes No past surgeries; IR FLUORO GUIDE PORT INSERTION RIGHT (05/13/2016); IR US Guide Vasc Access Right (05/13/2016); and Esophagogastroduodenoscopy (egd) with propofol (N/A,  12/17/2016). Prior to Admission medications   Medication Sig Start Date End Date Taking? Authorizing Provider  acetaminophen (TYLENOL) 500 MG tablet Take 500 mg by mouth 2 (two) times daily as needed for mild pain or headache.   Yes [provider]  albuterol (PROVENTIL) (2.5 MG/3ML) 0.083% nebulizer solution 1 neb every 4-6 hours as needed for wheezing and shortness of breath Patient taking differently: Take 2.5 mg by nebulization every 4 (four) hours as needed for wheezing or shortness of breath.  04/01/17  Yes Parrett, Tammy S, NP  amLODipine (NORVASC) 10 MG tablet Take 10 mg by mouth daily.   Yes [provider]  Chlorphen-Phenyleph-ASA (ALKA-SELTZER PLUS COLD PO) Take 2 tablets by mouth at bedtime as needed (COUGH).   Yes [provider]  dexamethasone (DECADRON) 4 MG tablet Take 4 mg by mouth as directed. Take 1 tablet twice daily the day before, the day of and the day after chemotherapy every 3 weeks.   Yes [provider]  feeding supplement, ENSURE ENLIVE, (ENSURE ENLIVE) LIQD Take 237 mLs by mouth 3 (three) times daily between meals. 03/19/17  Yes Shelly Coss, MD  fluconazole (DIFLUCAN) 100 MG tablet Take 1 tablet (100 mg total) by mouth daily. Patient taking differently: Take 100 mg by mouth daily. Continuous 03/20/17  Yes Adhikari, Tamsen Meek, MD  Fluticasone-Umeclidin-Vilant (TRELEGY ELLIPTA) 100-62.5-25 MCG/INH AEPB Inhale 1 puff into the lungs daily. 04/01/17  Yes Parrett, Tammy S, NP  furosemide (LASIX) 40 MG tablet Take 1 tablet (40 mg total) by mouth every three (3) days as needed for fluid or edema. 02/16/17 04/10/17 Yes Florencia Reasons, MD  guaiFENesin (MUCINEX) 600 MG 12 hr tablet Take 1 tablet (600 mg total) by mouth 2 (two) times daily. 03/19/17  Yes Adhikari, Tamsen Meek,  MD  lidocaine-prilocaine (EMLA) cream Apply 1 application topically as needed (for port access).    Yes [provider]  loratadine (CLARITIN) 10 MG tablet Take 10 mg by mouth daily.    Yes [provider]  metoprolol tartrate (LOPRESSOR) 25 MG tablet Take 25 mg by mouth 2 (two) times daily.   Yes [provider]  ondansetron (ZOFRAN-ODT) 4 MG disintegrating tablet Take 4 mg by mouth every 8 (eight) hours as needed for nausea or vomiting.   Yes [provider]  Oxycodone HCl 10 MG TABS Take 1 tablet (10 mg total) by mouth every 4 (four) hours as needed (or shortness of breath). Patient taking differently: Take 10 mg by mouth every 4 (four) hours as needed (for severe pain or shortness of breath).  04/08/17  Yes Curt Bears, MD  OXYGEN Inhale 2 L into the lungs continuous.   Yes [provider]  pantoprazole (PROTONIX) 40 MG tablet Take 1 tablet (40 mg total) by mouth 2 (two) times daily. 12/18/16  Yes Eugenie Filler, MD  polyethylene glycol [facility]) packet Take 17 g by mouth daily as needed for mild constipation. 03/19/17  Yes Adhikari, Tamsen Meek, MD  senna-docusate (SENNA-PLUS) 8.6-50 MG tablet Take 1 tablet by mouth at bedtime.   Yes [provider]  bisoprolol (ZEBETA) 5 MG tablet Take 1 tablet (5 mg total) by mouth daily. Patient not taking: Reported on 04/10/2017 02/16/17   Florencia Reasons, MD  Ipratropium-Albuterol (COMBIVENT) 20-100 MCG/ACT AERS respimat Inhale 1 puff into the lungs every 6 (six) hours. Patient not taking: Reported on 04/07/2017 07/22/16   Curt Bears, MD  mometasone-formoterol [facility]) 100-5 MCG/ACT AERO Inhale 2 puffs into the lungs 2 (two) times daily. Patient not taking: Reported on 04/10/2017 03/19/17   Shelly Coss, MD  tiotropium (SPIRIVA) 18 MCG inhalation capsule Place 1 capsule (18 mcg total) into inhaler and inhale daily. Patient not taking: Reported on 04/10/2017 03/20/17   Shelly Coss, MD   No Known Allergies  FAMILY HISTORY:  family history includes Cancer in his father; Diabetes Mellitus II in his mother. SOCIAL HISTORY:  reports that he has been smoking cigarettes.  He has a 3.00  pack-year smoking history. He has never used smokeless tobacco. He reports that he does not drink alcohol or use drugs.  REVIEW OF SYSTEMS:   Constitutional: Negative for fever, chills, weight loss, malaise/fatigue and diaphoresis.  HENT: Negative for hearing loss, ear pain, nosebleeds, congestion, sore throat, neck pain, tinnitus and ear discharge.   Eyes: Negative for blurred vision, double vision, photophobia, pain, discharge and redness.  Respiratory: Negative for cough, hemoptysis, sputum production, shortness of breath, wheezing and stridor.   Cardiovascular: Negative for chest pain, palpitations, orthopnea, claudication, leg swelling and PND.  Gastrointestinal: Negative for heartburn, nausea, vomiting, abdominal pain, diarrhea, constipation, blood in stool and melena.  Genitourinary: Negative for dysuria, urgency, frequency, hematuria and flank pain.  Musculoskeletal: Negative for myalgias, back pain, joint pain and falls.  Skin: Negative for itching and rash.  Neurological: Negative for dizziness, tingling, tremors, sensory change, speech change, focal weakness, seizures, loss of consciousness, weakness and headaches.  Endo/Heme/Allergies: Negative for environmental allergies and polydipsia. Does not bruise/bleed easily.  SUBJECTIVE:   VITAL SIGNS: Temp:  [97.5 F (36.4 C)-97.9 F (36.6 C)] 97.7 F (36.5 C) (03/25 1421) Pulse Rate:  [88-93] 93 (03/25 1421) Resp:  [17-22] 18 (03/25 1421) BP: (128-138)/(77-84) 129/81 (03/25 1421) SpO2:  [96 %-100 %] 100 % (03/25 1421)  PHYSICAL  EXAMINATION: General:  Chronically ill appearing male, NAD Neuro:  Alert and interactive, slow to follow commands HEENT:  [place]/AT, PERRL, EOM-I and MMM Cardiovascular:  RRR, Nl S1/S2 and -M/R/G Lungs:  Decreased BS on the right with dullness to percussion, clear on left Abdomen:  Soft, NT, ND and +BS Musculoskeletal:  -edema and -tenderness Skin:  Intact  Recent Labs  Lab 04/11/17 0649 04/12/17 0451  04/13/17 0354 04/14/17 0443  NA 139 139 141  --   K 4.2 4.6 4.4  --   CL 101 103 104  --   CO2 29 27 26   --   BUN 15 14 17   --   CREATININE 1.73* 1.65* 1.59* 1.60*  GLUCOSE 104* 144* 161*  --    Recent Labs  Lab 04/12/17 0451 04/13/17 0354 04/14/17 0443  HGB 8.5* 8.6* 9.1*  HCT 24.8* 26.3* 28.5*  WBC 7.5 13.9* 18.2*  PLT 144* 204 289   Ct Head Wo Contrast  Result Date: 04/14/2017 CLINICAL DATA:  [place] getting out of bed. History of metastatic lung cancer. EXAM: CT HEAD WITHOUT CONTRAST TECHNIQUE: Contiguous axial images were obtained from the base of the skull through the vertex without intravenous contrast. COMPARISON:  05/28/2011 FINDINGS: Brain: No evidence of acute infarction, hemorrhage, extra-axial collection, ventriculomegaly, or mass effect. Generalized cerebral atrophy. Periventricular white matter low attenuation likely secondary to microangiopathy. Vascular: Cerebrovascular atherosclerotic calcifications are noted. Skull: Negative for fracture or focal lesion. Sinuses/Orbits: Visualized portions of the orbits are unremarkable. Mastoid sinuses are clear. Bilateral mucosal thickening in the maxillary sinuses. Other: None. IMPRESSION: No acute intracranial pathology. Electronically Signed   By: Kathreen Devoid   On: 04/14/2017 09:07   Ct Chest Wo Contrast  Result Date: 04/13/2017 CLINICAL DATA:  57 year old male with lung cancer. Shortness of breath. Possible pneumonia on recent chest radiographs 04/10/2017. EXAM: CT CHEST WITHOUT CONTRAST TECHNIQUE: Multidetector CT imaging of the chest was performed following the standard protocol without IV contrast. COMPARISON:  Chest radiographs 04/10/2017 and earlier. Chest CTA 03/13/2017, and earlier. FINDINGS: Cardiovascular: Stable cardiac size. No pericardial effusion. Right chest porta cath remains in place. Vascular patency is not evaluated in the absence of IV contrast. Mediastinum/Nodes: Stable appearance of the mediastinum since February  in the absence of IV contrast today. Lungs/Pleura: Moderate size layering right pleural effusion has progressed since February with simple fluid density, and layering up to a depth of 5-6 centimeters in the right hemithorax. A small or trace left pleural effusion is new since February. Small volume retained secretions in the lower trachea. Otherwise the major airways are stable. Chronic emphysema. Spiculated opacity tracking along the left major fissure to the left hilum appears stable since February measuring 15 x 31 millimeters at a comparable level (15 x 29 millimeters previously). Aside from increased compressive atelectasis in the right lung, ventilation is stable since 03/13/2017. Upper Abdomen: Stable visible upper abdominal viscera with persistent fluid along the celiac axis and gastrohepatic ligament. Musculoskeletal: Heterogeneous sclerosis in the lower thoracic vertebral bodies is stable. No new osseous abnormality identified. IMPRESSION: 1. Moderate layering right pleural effusion has increased since February. Increased compressive right lung atelectasis. A trace left pleural effusion has also increased. 2. Otherwise stable ventilation and CT appearance of the lungs since February, including Emphysema (ICD10-J43.9) and spiculated opacity along the left major fissure compatible with lung carcinoma. 3. Stable nonspecific fluid collection along the gastrohepatic ligament and celiac axis in the upper abdomen. Electronically Signed   By: Herminio Heads.D.  On: 04/13/2017 23:05    ASSESSMENT / PLAN:  57 year old male with stage 4 adeno of the lung, recurrent pleural effusion, hypoxemia and mets presents to [facility] for evaluation of pleural effusion.  We evaluated the fluid with U/S there is a lung flap that would be directly in the way of our introduction of a needle to drain it.  Discussed with PCCM-NP.  Pleural effusion: likely malignant vs CHF  - BNP  - 2D echo noted  - IR to thora (call made and orders  placed)  - Fluid analysis  - May need a pleurex catheter placement  Hypoxemia:  - Titrate O2 for sat of 88-92%  Stage 4 Ad[place]:  - Will need to consider involvement of palliative care given advance stage and mets  - ?H/O consult for prognosis  Chronic pain from mets:  - Pain meds as ordered  - Minimize to avoid over sedation  COPD:  - Bronchodilators as ordered  - No need for IV steroids, unlikely to be an exacerbation  HCAP  - PCT  - If negative this d/c abx  Please call PCCM back with fluid analysis results if assistance is needed.  Rush Farmer, M.D. [facility] Pulmonary/Critical Care Medicine. Pager: [phone]. After hours pager: [phone].  04/14/2017, 2:57 PM

## 2017-04-15 ENCOUNTER — Inpatient Hospital Stay (HOSPITAL_COMMUNITY): Payer: Medicaid Other

## 2017-04-15 ENCOUNTER — Ambulatory Visit: Payer: Medicaid Other | Admitting: Oncology

## 2017-04-15 ENCOUNTER — Ambulatory Visit: Payer: Medicaid Other

## 2017-04-15 ENCOUNTER — Other Ambulatory Visit: Payer: Medicaid Other

## 2017-04-15 LAB — CREATININE, SERUM
CREATININE: 1.55 mg/dL — AB (ref 0.61–1.24)
GFR, EST AFRICAN AMERICAN: 56 mL/min — AB (ref >60)
GFR, EST NON AFRICAN AMERICAN: 48 mL/min — AB (ref >60)

## 2017-04-15 LAB — CULTURE, BLOOD (ROUTINE X 2)
Culture: NO GROWTH
Culture: NO GROWTH
Special Requests: ADEQUATE

## 2017-04-15 LAB — PROCALCITONIN: Procalcitonin: 0.38 ng/mL

## 2017-04-15 LAB — GLUCOSE, PLEURAL OR PERITONEAL FLUID: Glucose, Fluid: 151 mg/dL

## 2017-04-15 MED ORDER — LIDOCAINE HCL 1 % IJ SOLN
INTRAMUSCULAR | Status: AC
  Filled 2017-04-15: qty 20

## 2017-04-15 MED ORDER — LORAZEPAM 1 MG PO TABS
1.0000 mg | ORAL_TABLET | Freq: Two times a day (BID) | ORAL | Status: DC | PRN
  Administered 2017-04-15 – 2017-04-16 (×3): 1 mg via ORAL
  Filled 2017-04-15 (×3): qty 1

## 2017-04-15 MED ORDER — IPRATROPIUM-ALBUTEROL 0.5-2.5 (3) MG/3ML IN SOLN: 3.0000 mL | RESPIRATORY_TRACT | Status: DC | PRN

## 2017-04-15 MED ORDER — METHYLPREDNISOLONE SODIUM SUCC 40 MG IJ SOLR
40.0000 mg | Freq: Every day | INTRAMUSCULAR | Status: DC
  Administered 2017-04-16 – 2017-04-17 (×2): 40 mg via INTRAVENOUS
  Filled 2017-04-15 (×2): qty 1

## 2017-04-15 NOTE — Progress Notes (Signed)
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  Patient is from nursing home facility.  ED evaluation he noted to have increasing oxygen requirement from his baseline.  663 shows pneumonia on the right middle lobe and left lower lobe.  Patient was admitted with working diagnosis of healthcare associated pneumonia and started on broad-spectrum antibiotics.  Also was noted to have elevated creatinine for which he was started on gentle hydration.  Patient breathing with slow improvement, CT of the chest was obtained and showed large pleural effusion, P CCM was consulted and patient underwent thoracentesis by IR.   Subjective: Patient seen and examined, he is a status post thoracentesis, breathing has significantly improved.  Denies chest pain and shortness of breath.  Feels that is somewhat to baseline.  No acute events overnight  Assessment & Plan: Acute on chronic respiratory failure due to healthcare associated pneumonia multifocal. Multifactorial causes adenocarcinoma of left lung, pneumonia, COPD and large pleural effusion Initially treated with cefepime and vancomycin, MRSA negative. Antibiotic therapy has been escalated to doxycycline. CT of the chest shows large right pleural effusion  Status post thoracentesis 3/26 - awaiting pathology results parapneumonic versus malignant Continue antibiotics for now, pro-calcitonin improving.  Taper Solu-Medrol, continue nebulizer treatment. Supportive treatment  COPD exacerbation - O2 dependent -slow improvement Actively smoking Not compliant with oxygen supplementation at home, he was instructed to use  oxygen continues but only use it intermittently. 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 Earlie Server as an outpatient he is currently on maintenance chemotherapy Will need oncology input for prognosis - Dr. Earlie Server added to treatment team  Anemia and thrombocytopenia Likely bone marrow suppression from chemotherapy Patient was transfused 1 unit of PRBCs Hgb remains stable  Monitor  DVT prophylaxis: Heparin SQ Code Status: Full code Family Communication: None at bedside Disposition Plan: Back to SNF when respiratory status improve   Consultants:   Oncology  Procedures:   None  Antimicrobials: Anti-infectives (From admission, onward)   Start     Dose/Rate Route Frequency Ordered Stop   04/12/17 1600  doxycycline (VIBRAMYCIN) 100 mg in sodium chloride 0.9 % 250 mL IVPB     100 mg 125 mL/hr over 120 Minutes Intravenous Every 12 hours 04/12/17 1505     04/11/17 0800  vancomycin (VANCOCIN) 1,250 mg in sodium chloride 0.9 % 250 mL IVPB  Status:  Discontinued     1,250 mg 166.7 mL/hr over 90 Minutes Intravenous Every 48 hours 04/10/17 1218 04/12/17 1505   04/10/17 1400  ceFEPIme (MAXIPIME) 1 g in sodium chloride 0.9 % 100 mL IVPB  Status:  Discontinued     1 g 200 mL/hr over 30 Minutes Intravenous Every 8 hours 04/10/17 1147 04/10/17 1152   04/10/17 1400  ceFEPIme (MAXIPIME) 1 g in sodium chloride 0.9 % 100 mL IVPB  Status:  Discontinued     1 g 200 mL/hr over 30 Minutes Intravenous Every 8 hours 04/10/17 1152 04/10/17 1155   04/10/17 1300  vancomycin (  VANCOCIN) IVPB 1000 mg/200 mL premix     1,000 mg 200 mL/hr over 60 Minutes Intravenous  Once 04/10/17 1155 04/10/17 1521   04/10/17 1230  ceFEPIme (MAXIPIME) 1 g in sodium chloride 0.9 % 100 mL IVPB  Status:  Discontinued     1  g 200 mL/hr over 30 Minutes Intravenous Every 12 hours 04/10/17 1155 04/12/17 1505      Objective: Vitals:   04/15/17 0507 04/15/17 1000 04/15/17 1032 04/15/17 1410  BP: 133/75 140/77 120/78   Pulse: 93     Resp: 20     Temp: 98 F (36.7 C)     TempSrc: Oral     SpO2: 100%   98%  Weight:      Height:        Intake/Output Summary (Last 24 hours) at 04/15/2017 1516 Last data filed at 04/15/2017 1323 Gross per 24 hour  Intake 940 ml  Output 1325 ml  Net -385 ml   Filed Weights   04/10/17 0853  Weight: 57.6 kg (127 lb)    Examination:  General: NAD Cardiovascular: RRR, S1/S2  Respiratory: Air entry improved, still diminished, mild crackles at the right base.  No wheezing Abdominal: Soft, NT, ND Extremities: Trace lower extremity edema  Data Reviewed: I have personally reviewed following labs and imaging studies  CBC: Recent Labs  Lab 04/10/17 0926 04/10/17 1835 04/11/17 0649 04/12/17 0451 04/13/17 0354 04/14/17 0443  WBC 5.3 5.8 5.7 7.5 13.9* 18.2*  NEUTROABS 3.8  --  3.9 6.9  --   --   HGB 8.1* 7.6* 7.0* 8.5* 8.6* 9.1*  HCT 24.5* 22.9* 21.1* 24.8* 26.3* 28.5*  MCV 102.1* 103.2* 104.5* 99.6 99.6 102.9*  PLT 82* 99* 106* 144* 204 528   Basic Metabolic Panel: Recent Labs  Lab 04/10/17 0926  04/11/17 0649 04/12/17 0451 04/13/17 0354 04/14/17 0443 04/15/17 0505  NA 137  --  139 139 141  --   --   K 4.1  --  4.2 4.6 4.4  --   --   CL 94*  --  101 103 104  --   --   CO2 32  --  29 27 26   --   --   GLUCOSE 117*  --  104* 144* 161*  --   --   BUN 18  --  15 14 17   --   --   CREATININE 2.01*   < > 1.73* 1.65* 1.59* 1.60* 1.55*  CALCIUM 9.1  --  8.8* 9.1 9.5  --   --   MG  --   --   --  1.9  --   --   --    < > = values in this interval not displayed.   GFR: Estimated Creatinine Clearance: 42.8 mL/min (A) (by C-G formula based on SCr of 1.55 mg/dL (H)). Liver Function Tests: Recent Labs  Lab 04/10/17 0926 04/11/17 0649  AST 24 20  ALT 14* 12*   ALKPHOS 112 98  BILITOT 0.5 0.2*  PROT 6.7 6.1*  ALBUMIN 2.6* 2.3*   No results for input(s): LIPASE, AMYLASE in the last 168 hours. No results for input(s): AMMONIA in the last 168 hours. Coagulation Profile: No results for input(s): INR, PROTIME in the last 168 hours. Cardiac Enzymes: Recent Labs  Lab 04/10/17 0926  TROPONINI <0.03   BNP (last 3 results) No results for input(s): PROBNP in the last 8760 hours. HbA1C: No results for input(s): HGBA1C in the last 72 hours. CBG:  No results for input(s): GLUCAP in the last 168 hours. Lipid Profile: No results for input(s): CHOL, HDL, LDLCALC, TRIG, CHOLHDL, LDLDIRECT in the last 72 hours. Thyroid Function Tests: No results for input(s): TSH, T4TOTAL, FREET4, T3FREE, THYROIDAB in the last 72 hours. Anemia Panel: No results for input(s): VITAMINB12, FOLATE, FERRITIN, TIBC, IRON, RETICCTPCT in the last 72 hours. Sepsis Labs: Recent Labs  Lab 04/10/17 1103 04/11/17 1108 04/14/17 1900 04/15/17 0505  PROCALCITON  --  1.89 0.47 0.38  LATICACIDVEN 0.66  --   --   --     Recent Results (from the past 240 hour(s))  Blood culture (routine x 2)     Status: None   Collection Time: 04/10/17 10:57 AM  Result Value Ref Range Status   Specimen Description   Final    BLOOD LEFT ANTECUBITAL Performed at [facility] [address], [place], [place] [address]    Special Requests   Final    BOTTLES DRAWN AEROBIC AND ANAEROBIC Blood Culture results may not be optimal due to an excessive volume of blood received in culture bottles Performed at [facility] [address], [place], [place] [address]    Culture   Final    NO GROWTH 5 DAYS Performed at [facility], [place] [address], [place], [place] [address]    Report Status 04/15/2017 FINAL  Final  Blood culture (routine x 2)     Status: None   Collection Time: 04/10/17 10:57 AM  Result Value Ref Range Status   Specimen Description   Final     BLOOD RIGHT PORTA CATH Performed at [facility] [address], [place], [place] [address]    Special Requests   Final    BOTTLES DRAWN AEROBIC AND ANAEROBIC Blood Culture adequate volume Performed at [facility] [address] [place], [place] [address]    Culture   Final    NO GROWTH 5 DAYS Performed at [facility], [place] [address] [place], [place] [address]    Report Status 04/15/2017 FINAL  Final  Respiratory Panel by PCR     Status: None   Collection Time: 04/10/17  4:57 PM  Result Value Ref Range Status   Adenovirus NOT DETECTED NOT DETECTED Final   Coronavirus 229E NOT DETECTED NOT DETECTED Final   Coronavirus HKU1 NOT DETECTED NOT DETECTED Final   Coronavirus NL63 NOT DETECTED NOT DETECTED Final   Coronavirus OC43 NOT DETECTED NOT DETECTED Final   Metapneumovirus NOT DETECTED NOT DETECTED Final   Rhinovirus / Enterovirus NOT DETECTED NOT DETECTED Final   Influenza A NOT DETECTED NOT DETECTED Final   Influenza B NOT DETECTED NOT DETECTED Final   Parainfluenza Virus 1 NOT DETECTED NOT DETECTED Final   Parainfluenza Virus 2 NOT DETECTED NOT DETECTED Final   Parainfluenza Virus 3 NOT DETECTED NOT DETECTED Final   Parainfluenza Virus 4 NOT DETECTED NOT DETECTED Final   Respiratory Syncytial Virus NOT DETECTED NOT DETECTED Final   Bordetella pertussis NOT DETECTED NOT DETECTED Final   Chlamydophila pneumoniae NOT DETECTED NOT DETECTED Final   Mycoplasma pneumoniae NOT DETECTED NOT DETECTED Final    Comment: Performed at [facility], [place] [address], [place], [place] [address]  MRSA PCR Screening     Status: None   Collection Time: 04/11/17 11:38 AM  Result Value Ref Range Status   MRSA by PCR NEGATIVE NEGATIVE Final    Comment:  The GeneXpert MRSA Assay (FDA approved for NASAL specimens only), is one component of a comprehensive MRSA colonization surveillance program. It is not intended to diagnose  MRSA infection nor to guide or monitor treatment for MRSA infections. Performed at [facility], [place] [address] [place], [place] [address]       Radiology Studies: Dg Chest 1 View  Result Date: 04/15/2017 CLINICAL DATA:  Followup right thoracentesis. EXAM: CHEST  1 VIEW COMPARISON:  CT 04/13/2017. FINDINGS: Background pattern of emphysema. Mild cardiomegaly. Power port in place from the right with tip in the right atrium. No residual pleural fluid visible on the right. No pneumothorax. Small left effusion with left base volume loss as seen previously. IMPRESSION: No residual pleural fluid visible on the right.  No pneumothorax. Small left effusion with left base volume loss. Emphysema. Electronically Signed   By: Nelson Chimes M.D.   On: 04/15/2017 10:48   Ct Head Wo Contrast  Result Date: 04/14/2017 CLINICAL DATA:  [place] getting out of bed. History of metastatic lung cancer. EXAM: CT HEAD WITHOUT CONTRAST TECHNIQUE: Contiguous axial images were obtained from the base of the skull through the vertex without intravenous contrast. COMPARISON:  05/28/2011 FINDINGS: Brain: No evidence of acute infarction, hemorrhage, extra-axial collection, ventriculomegaly, or mass effect. Generalized cerebral atrophy. Periventricular white matter low attenuation likely secondary to microangiopathy. Vascular: Cerebrovascular atherosclerotic calcifications are noted. Skull: Negative for fracture or focal lesion. Sinuses/Orbits: Visualized portions of the orbits are unremarkable. Mastoid sinuses are clear. Bilateral mucosal thickening in the maxillary sinuses. Other: None. IMPRESSION: No acute intracranial pathology. Electronically Signed   By: Kathreen Devoid   On: 04/14/2017 09:07   Ct Chest Wo Contrast  Result Date: 04/13/2017 CLINICAL DATA:  57 year old male with lung cancer. Shortness of breath. Possible pneumonia on recent chest radiographs 04/10/2017. EXAM: CT CHEST WITHOUT CONTRAST TECHNIQUE:  Multidetector CT imaging of the chest was performed following the standard protocol without IV contrast. COMPARISON:  Chest radiographs 04/10/2017 and earlier. Chest CTA 03/13/2017, and earlier. FINDINGS: Cardiovascular: Stable cardiac size. No pericardial effusion. Right chest porta cath remains in place. Vascular patency is not evaluated in the absence of IV contrast. Mediastinum/Nodes: Stable appearance of the mediastinum since February in the absence of IV contrast today. Lungs/Pleura: Moderate size layering right pleural effusion has progressed since February with simple fluid density, and layering up to a depth of 5-6 centimeters in the right hemithorax. A small or trace left pleural effusion is new since February. Small volume retained secretions in the lower trachea. Otherwise the major airways are stable. Chronic emphysema. Spiculated opacity tracking along the left major fissure to the left hilum appears stable since February measuring 15 x 31 millimeters at a comparable level (15 x 29 millimeters previously). Aside from increased compressive atelectasis in the right lung, ventilation is stable since 03/13/2017. Upper Abdomen: Stable visible upper abdominal viscera with persistent fluid along the celiac axis and gastrohepatic ligament. Musculoskeletal: Heterogeneous sclerosis in the lower thoracic vertebral bodies is stable. No new osseous abnormality identified. IMPRESSION: 1. Moderate layering right pleural effusion has increased since February. Increased compressive right lung atelectasis. A trace left pleural effusion has also increased. 2. Otherwise stable ventilation and CT appearance of the lungs since February, including Emphysema (ICD10-J43.9) and spiculated opacity along the left major fissure compatible with lung carcinoma. 3. Stable nonspecific fluid collection along the gastrohepatic ligament and celiac axis in the upper abdomen. Electronically Signed   By: Genevie Ann M.D.   On: 04/13/2017  23:05     US Thoracentesis Asp Pleural Space W/img Guide  Result Date: 04/15/2017 INDICATION: Patient with history of metastatic left lung cancer, dyspnea, right pleural effusion. Request made for diagnostic and therapeutic right thoracentesis. EXAM: ULTRASOUND GUIDED DIAGNOSTIC AND THERAPEUTIC RIGHT THORACENTESIS MEDICATIONS: None COMPLICATIONS: None immediate. PROCEDURE: An ultrasound guided thoracentesis was thoroughly discussed with the patient and questions answered. The benefits, risks, alternatives and complications were also discussed. The patient understands and wishes to proceed with the procedure. Written consent was obtained. Ultrasound was performed to localize and mark an adequate pocket of fluid in the right chest. The area was then prepped and draped in the normal sterile fashion. 1% Lidocaine was used for local anesthesia. Under ultrasound guidance a 6 Fr Safe-T-Centesis catheter was introduced. Thoracentesis was performed. The catheter was removed and a dressing applied. FINDINGS: A total of approximately 1 liter of hazy, blood-tinged/amber fluid was removed. Samples were sent to the laboratory as requested by the clinical team. IMPRESSION: Successful ultrasound guided diagnostic and therapeutic right thoracentesis yielding 1 liter of pleural fluid. Follow-up chest x-ray revealed no pneumothorax. Read by: Rowe Robert, PA-C Electronically Signed   By: Corrie Mckusick D.O.   On: 04/15/2017 12:11      Scheduled Meds: . amLODipine  10 mg Oral Daily  . arformoterol  15 mcg Nebulization BID  . budesonide (PULMICORT) nebulizer solution  0.5 mg Nebulization BID  . escitalopram  5 mg Oral QHS  . feeding supplement (ENSURE ENLIVE)  237 mL Oral BID BM  . heparin  5,000 Units Subcutaneous Q8H  . ipratropium-albuterol  3 mL Nebulization Q6H  . lidocaine      . loratadine  10 mg Oral Daily  . methylPREDNISolone (SOLU-MEDROL) injection  40 mg Intravenous Q12H  . metoprolol tartrate  25 mg Oral BID  .  pantoprazole  40 mg Oral BID  . senna-docusate  1 tablet Oral QHS   Continuous Infusions: . doxycycline (VIBRAMYCIN) IV 100 mg (04/15/17 0426)     LOS: 5 days    Time spent: Total of 25 minutes spent with pt, greater than 50% of which was spent in discussion of  treatment, counseling and coordination of care   Chipper Oman, MD Pager: Text Page via [email or web address]   If 7PM-7AM, please contact night-coverage [email or web address] 04/15/2017, 3:16 PM   Note - This record has been created using [employer]. Chart creation errors have been sought, but may not always have been located. Such creation errors do not reflect on the standard of medical care.

## 2017-04-15 NOTE — Procedures (Signed)
Ultrasound-guided diagnostic and therapeutic right thoracentesis performed yielding 1 liter of hazy, blood-tinged/amber fluid. No immediate complications. Follow-up chest x-ray pending.The fluid was sent to the lab for preordered studies.

## 2017-04-16 ENCOUNTER — Inpatient Hospital Stay (HOSPITAL_COMMUNITY): Payer: Medicaid Other

## 2017-04-16 ENCOUNTER — Institutional Professional Consult (permissible substitution): Payer: Medicaid Other | Admitting: Internal Medicine

## 2017-04-16 DIAGNOSIS — J9611 Chronic respiratory failure with hypoxia: Secondary | ICD-10-CM

## 2017-04-16 DIAGNOSIS — J189 Pneumonia, unspecified organism: Principal | ICD-10-CM

## 2017-04-16 DIAGNOSIS — N183 Chronic kidney disease, stage 3 (moderate): Secondary | ICD-10-CM

## 2017-04-16 LAB — CBC WITH DIFFERENTIAL/PLATELET
BASOS ABS: 0 10*3/uL (ref 0.0–0.1)
Basophils Relative: 0 %
Eosinophils Absolute: 0 10*3/uL (ref 0.0–0.7)
Eosinophils Relative: 0 %
HEMATOCRIT: 27.3 % — AB (ref 39.0–52.0)
HEMOGLOBIN: 8.7 g/dL — AB (ref 13.0–17.0)
LYMPHS PCT: 9 %
Lymphs Abs: 1.6 10*3/uL (ref 0.7–4.0)
MCH: 33.3 pg (ref 26.0–34.0)
MCHC: 31.9 g/dL (ref 30.0–36.0)
MCV: 104.6 fL — ABNORMAL HIGH (ref 78.0–100.0)
Monocytes Absolute: 1 10*3/uL (ref 0.1–1.0)
Monocytes Relative: 5 %
NEUTROS ABS: 15.5 10*3/uL (ref 1.7–7.7)
NEUTROS PCT: 86 %
Platelets: 309 10*3/uL (ref 150–400)
RBC: 2.61 MIL/uL — AB (ref 4.22–5.81)
RDW: 16.9 % — ABNORMAL HIGH (ref 11.5–15.5)
WBC: 18 10*3/uL — ABNORMAL HIGH (ref 4.0–10.5)

## 2017-04-16 LAB — BASIC METABOLIC PANEL
ANION GAP: 9 (ref 5–15)
BUN: 28 mg/dL — ABNORMAL HIGH (ref 6–20)
CHLORIDE: 105 mmol/L (ref 101–111)
CO2: 31 mmol/L (ref 22–32)
Calcium: 9.8 mg/dL (ref 8.9–10.3)
Creatinine, Ser: 1.41 mg/dL — ABNORMAL HIGH (ref 0.61–1.24)
GFR calc non Af Amer: 54 mL/min — ABNORMAL LOW (ref >60)
Glucose, Bld: 105 mg/dL — ABNORMAL HIGH (ref 65–99)
POTASSIUM: 4.9 mmol/L (ref 3.5–5.1)
Sodium: 145 mmol/L (ref 135–145)

## 2017-04-16 LAB — MAGNESIUM: MAGNESIUM: 2 mg/dL (ref 1.7–2.4)

## 2017-04-16 LAB — PROCALCITONIN: PROCALCITONIN: 0.31 ng/mL

## 2017-04-16 MED ORDER — FUROSEMIDE 10 MG/ML IJ SOLN
40.0000 mg | Freq: Every day | INTRAMUSCULAR | Status: DC
  Administered 2017-04-16 – 2017-04-17 (×2): 40 mg via INTRAVENOUS
  Filled 2017-04-16 (×2): qty 4

## 2017-04-16 NOTE — Progress Notes (Addendum)
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  Patient is from nursing home facility.  ED evaluation he noted to have increasing oxygen requirement from his baseline. Patient was admitted with working diagnosis of healthcare associated pneumonia and started on broad-spectrum antibiotics.  Also was noted to have elevated creatinine for which he was started on gentle hydration.  Patient breathing with slow improvement, CT of the chest was obtained and showed large pleural effusion, P CCM was consulted and patient underwent thoracentesis by IR.   Assessment & Plan:   #Acute on chronic respiratory failure due to possible HCAP/pleural effusion/COPD. -Patient was initially treated with vancomycin and cefepime, MRSA negative therefore antibiotics were changed to doxycycline.  Patient underwent thoracocentesis on March 26, awaiting fluid test results.  Evaluated by pulmonary consult.  Currently on Solu-Medrol and bronchodilator.  Requiring 3 L of oxygen try to wean down slowly.  Discussed with the nurse.  Repeat chest x-ray today with improvement in pleural effusion. -Patient was having coughing spell today with lower extremity edema therefore started Lasix.  #COPD exacerbation: Continue steroid, bronchodilators.  #Chronic kidney disease stage III: Creatinine stable.  Patient with cough and lower extremity edema.  Ordered IV Lasix today.  Monitor BMP, urine output.  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  #Hypertension: On Norvasc, diuretics, metoprolol.  #PT OT evaluation recommended SNF.  Social worker consulted.  DVT prophylaxis: Heparin  subcutaneous Code Status: Full code Family Communication: No family at bedside Disposition Plan: Likely discharge any significant 1 to 2 days    Consultants:   Pulmonary consult  Procedures: Thoracocentesis Antimicrobials: Doxycycline  Subjective: Seen and examined at bedside.  Reported a cough worsening today.  Shortness of breath is better.  Denies chest pain.  Denied nausea vomiting or abdominal pain.  Objective: Vitals:   04/15/17 2139 04/16/17 0440 04/16/17 0907 04/16/17 0951  BP: 131/76 137/78    Pulse: 91 77    Resp: 18 17    Temp: 97.7 F (36.5 C) 98.2 F (36.8 C)    TempSrc: Oral Oral    SpO2: 100% 100% 99% 98%  Weight:      Height:        Intake/Output Summary (Last 24 hours) at 04/16/2017 1222 Last data filed at 04/16/2017 1152 Gross per 24 hour  Intake 240 ml  Output 2425 ml  Net -2185 ml   Filed Weights   04/10/17 0853  Weight: 57.6 kg (127 lb)    Examination:  General exam: Appears calm and comfortable  Respiratory system: Bilateral mild expiratory wheeze and crackle. Cardiovascular system: S1 & S2 heard, RRR.  Bilateral lower extremity edema pitting.. Gastrointestinal system: Abdomen is nondistended, soft and nontender. Normal bowel sounds heard. Central nervous system: Alert and oriented. No focal neurological deficits. Skin: No rashes, lesions or ulcers Psychiatry: Judgement and insight appear normal. Mood & affect appropriate.     Data Reviewed: I have personally reviewed following labs and imaging studies  CBC: Recent Labs  Lab 04/10/17 0926  04/11/17 0649 04/12/17 0451 04/13/17 0354 04/14/17 0443 04/16/17 0358  WBC 5.3   < > 5.7 7.5 13.9* 18.2* 18.0*  NEUTROABS 3.8  --  3.9  6.9  --   --  15.5  HGB 8.1*   < > 7.0* 8.5* 8.6* 9.1* 8.7*  HCT 24.5*   < > 21.1* 24.8* 26.3* 28.5* 27.3*  MCV 102.1*   < > 104.5* 99.6 99.6 102.9* 104.6*  PLT 82*   < > 106* 144* 204 289 309   < > = values in this interval not displayed.   Basic  Metabolic Panel: Recent Labs  Lab 04/10/17 0926  04/11/17 0649 04/12/17 0451 04/13/17 0354 04/14/17 0443 04/15/17 0505 04/16/17 0358  NA 137  --  139 139 141  --   --  145  K 4.1  --  4.2 4.6 4.4  --   --  4.9  CL 94*  --  101 103 104  --   --  105  CO2 32  --  29 27 26   --   --  31  GLUCOSE 117*  --  104* 144* 161*  --   --  105*  BUN 18  --  15 14 17   --   --  28*  CREATININE 2.01*   < > 1.73* 1.65* 1.59* 1.60* 1.55* 1.41*  CALCIUM 9.1  --  8.8* 9.1 9.5  --   --  9.8  MG  --   --   --  1.9  --   --   --  2.0   < > = values in this interval not displayed.   GFR: Estimated Creatinine Clearance: 47.1 mL/min (A) (by C-G formula based on SCr of 1.41 mg/dL (H)). Liver Function Tests: Recent Labs  Lab 04/10/17 0926 04/11/17 0649  AST 24 20  ALT 14* 12*  ALKPHOS 112 98  BILITOT 0.5 0.2*  PROT 6.7 6.1*  ALBUMIN 2.6* 2.3*   No results for input(s): LIPASE, AMYLASE in the last 168 hours. No results for input(s): AMMONIA in the last 168 hours. Coagulation Profile: No results for input(s): INR, PROTIME in the last 168 hours. Cardiac Enzymes: Recent Labs  Lab 04/10/17 0926  TROPONINI <0.03   BNP (last 3 results) No results for input(s): PROBNP in the last 8760 hours. HbA1C: No results for input(s): HGBA1C in the last 72 hours. CBG: No results for input(s): GLUCAP in the last 168 hours. Lipid Profile: No results for input(s): CHOL, HDL, LDLCALC, TRIG, CHOLHDL, LDLDIRECT in the last 72 hours. Thyroid Function Tests: No results for input(s): TSH, T4TOTAL, FREET4, T3FREE, THYROIDAB in the last 72 hours. Anemia Panel: No results for input(s): VITAMINB12, FOLATE, FERRITIN, TIBC, IRON, RETICCTPCT in the last 72 hours. Sepsis Labs: Recent Labs  Lab 04/10/17 1103 04/11/17 1108 04/14/17 1900 04/15/17 0505 04/16/17 0358  PROCALCITON  --  1.89 0.47 0.38 0.31  LATICACIDVEN 0.66  --   --   --   --     Recent Results (from the past 240 hour(s))  Blood culture (routine x 2)      Status: None   Collection Time: 04/10/17 10:57 AM  Result Value Ref Range Status   Specimen Description   Final    BLOOD LEFT ANTECUBITAL Performed at [facility] [address], [place], [place] [address]    Special Requests   Final    BOTTLES DRAWN AEROBIC AND ANAEROBIC Blood Culture results may not be optimal due to an excessive volume of blood received in culture bottles Performed at [facility] [address] [place], [place] [address]    Culture   Final    NO  GROWTH 5 DAYS Performed at [facility], [place] [address], [place], [place] [address]    Report Status 04/15/2017 FINAL  Final  Blood culture (routine x 2)     Status: None   Collection Time: 04/10/17 10:57 AM  Result Value Ref Range Status   Specimen Description   Final    BLOOD RIGHT PORTA CATH Performed at [facility] [address] [place], [place] [address]    Special Requests   Final    BOTTLES DRAWN AEROBIC AND ANAEROBIC Blood Culture adequate volume Performed at [facility] [address], [place], [place] [address]    Culture   Final    NO GROWTH 5 DAYS Performed at [facility], [place] [address] [place], [place] [address]    Report Status 04/15/2017 FINAL  Final  Respiratory Panel by PCR     Status: None   Collection Time: 04/10/17  4:57 PM  Result Value Ref Range Status   Adenovirus NOT DETECTED NOT DETECTED Final   Coronavirus 229E NOT DETECTED NOT DETECTED Final   Coronavirus HKU1 NOT DETECTED NOT DETECTED Final   Coronavirus NL63 NOT DETECTED NOT DETECTED Final   Coronavirus OC43 NOT DETECTED NOT DETECTED Final   Metapneumovirus NOT DETECTED NOT DETECTED Final   Rhinovirus / Enterovirus NOT DETECTED NOT DETECTED Final   Influenza A NOT DETECTED NOT DETECTED Final   Influenza B NOT DETECTED NOT DETECTED Final   Parainfluenza Virus 1 NOT DETECTED NOT DETECTED Final   Parainfluenza Virus 2 NOT DETECTED  NOT DETECTED Final   Parainfluenza Virus 3 NOT DETECTED NOT DETECTED Final   Parainfluenza Virus 4 NOT DETECTED NOT DETECTED Final   Respiratory Syncytial Virus NOT DETECTED NOT DETECTED Final   Bordetella pertussis NOT DETECTED NOT DETECTED Final   Chlamydophila pneumoniae NOT DETECTED NOT DETECTED Final   Mycoplasma pneumoniae NOT DETECTED NOT DETECTED Final    Comment: Performed at [facility], [place] [address] [place], [place] [address]  MRSA PCR Screening     Status: None   Collection Time: 04/11/17 11:38 AM  Result Value Ref Range Status   MRSA by PCR NEGATIVE NEGATIVE Final    Comment:        The GeneXpert MRSA Assay (FDA approved for NASAL specimens only), is one component of a comprehensive MRSA colonization surveillance program. It is not intended to diagnose MRSA infection nor to guide or monitor treatment for MRSA infections. Performed at [facility], [place] [address] [place], [place] [address]   Body fluid culture     Status: None (Preliminary result)   Collection Time: 04/15/17 10:46 AM  Result Value Ref Range Status   Specimen Description   Final    PLEURAL RIGHT Performed at [facility] [address] [place], [place] [address]    Special Requests   Final    NONE Performed at [facility], [place] [address], [place], [place] [address]    Gram Stain   Final    RARE WBC PRESENT, PREDOMINANTLY MONONUCLEAR NO ORGANISMS SEEN Performed at [facility], [place] [address], [place],  [address]    Culture PENDING  Incomplete   Report Status PENDING  Incomplete         Radiology Studies: Dg Chest 1 View  Result Date: 04/15/2017 CLINICAL DATA:  Followup right thoracentesis. EXAM: CHEST  1 VIEW COMPARISON:  CT 04/13/2017. FINDINGS: Background pattern of emphysema. Mild cardiomegaly.  Power port in place from the right with tip in the right atrium. No residual pleural fluid visible on the  right. No pneumothorax. Small left effusion with left base volume loss as seen previously. IMPRESSION: No residual pleural fluid visible on the right.  No pneumothorax. Small left effusion with left base volume loss. Emphysema. Electronically Signed   By: Nelson Chimes M.D.   On: 04/15/2017 10:48   Dg Chest Port 1 View  Result Date: 04/16/2017 CLINICAL DATA:  Cough and shortness of breath; history of stage IV adenocarcinoma of the left lung, current smoker, COPD. EXAM: PORTABLE CHEST 1 VIEW COMPARISON:  Portable chest x-ray of April 15, 2017 and chest CT scan of April 13, 2017. FINDINGS: The lungs remain hyperinflated. There is increased density at the left lung base. There is no right pleural effusion or pneumothorax. The heart and pulmonary vascularity are normal. The power port catheter tip projects over the distal third of the SVC. There is decreased fluid in the major fissure. IMPRESSION: COPD. No recurrent right pleural effusion. Trace left pleural effusion versus pleural thickening. No pneumothorax. The known left lower lobe pulmonary mass is not clearly evident. Electronically Signed   By: David  Martinique M.D.   On: 04/16/2017 10:18   US Thoracentesis Asp Pleural Space W/img Guide  Result Date: 04/15/2017 INDICATION: Patient with history of metastatic left lung cancer, dyspnea, right pleural effusion. Request made for diagnostic and therapeutic right thoracentesis. EXAM: ULTRASOUND GUIDED DIAGNOSTIC AND THERAPEUTIC RIGHT THORACENTESIS MEDICATIONS: None COMPLICATIONS: None immediate. PROCEDURE: An ultrasound guided thoracentesis was thoroughly discussed with the patient and questions answered. The benefits, risks, alternatives and complications were also discussed. The patient understands and wishes to proceed with the procedure. Written consent was obtained. Ultrasound was performed to localize and mark an adequate pocket of fluid in the right chest. The area was then prepped and draped in the normal  sterile fashion. 1% Lidocaine was used for local anesthesia. Under ultrasound guidance a 6 Fr Safe-T-Centesis catheter was introduced. Thoracentesis was performed. The catheter was removed and a dressing applied. FINDINGS: A total of approximately 1 liter of hazy, blood-tinged/amber fluid was removed. Samples were sent to the laboratory as requested by the clinical team. IMPRESSION: Successful ultrasound guided diagnostic and therapeutic right thoracentesis yielding 1 liter of pleural fluid. Follow-up chest x-ray revealed no pneumothorax. Read by: Rowe Robert, PA-C Electronically Signed   By: Corrie Mckusick D.O.   On: 04/15/2017 12:11        Scheduled Meds: . amLODipine  10 mg Oral Daily  . arformoterol  15 mcg Nebulization BID  . budesonide (PULMICORT) nebulizer solution  0.5 mg Nebulization BID  . escitalopram  5 mg Oral QHS  . feeding supplement (ENSURE ENLIVE)  237 mL Oral BID BM  . furosemide  40 mg Intravenous Daily  . heparin  5,000 Units Subcutaneous Q8H  . loratadine  10 mg Oral Daily  . methylPREDNISolone (SOLU-MEDROL) injection  40 mg Intravenous Daily  . metoprolol tartrate  25 mg Oral BID  . pantoprazole  40 mg Oral BID  . senna-docusate  1 tablet Oral QHS   Continuous Infusions: . doxycycline (VIBRAMYCIN) IV 100 mg (04/16/17 0413)     LOS: 6 days    Demetrius Barrell Tanna Furry, MD [facility] Pager [phone]  If 7PM-7AM, please contact night-coverage [email or web address] Password [facility] 04/16/2017, 12:22 PM

## 2017-04-16 NOTE — Progress Notes (Signed)
LCSW following for facility placement.   Patient from [name]. PT rec SNF.  Patient is agreeable to SNF.   LCSW faxed patient out to facilities.   LCSW will continue to follow.   Carolin Coy [facility] [place]

## 2017-04-16 NOTE — Evaluation (Signed)
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  Patient is from nursing home facility.     Clinical Impression  Pt admitted with above diagnosis. Pt currently with functional limitations due to the deficits listed below (see PT Problem List). Pt o2 sat 96% with gait, but with 2-3/4 dyspnea.  Pt unsteady at times and would benefit from a rollator to use in the community.  Pt will benefit from skilled PT to increase their independence and safety with mobility to allow discharge to the venue listed below.    Follow Up Recommendations SNF    Equipment Recommendations  Other (comment)(rollator)    Recommendations for Other Services       Precautions / Restrictions Precautions Precautions: Fall Precaution Comments: 2 falls in the last 6 months, monitor o2      Mobility  Bed Mobility               General bed mobility comments: Sitting up in chair upon arrival  Transfers Overall transfer level: Needs assistance   Transfers: Sit to/from Stand Sit to Stand: Min guard         General transfer comment: min/guard for steadying.  More unsteady with stand > sit  Ambulation/Gait Ambulation/Gait assistance: Min guard;Min assist Ambulation Distance (Feet): 65 Feet Assistive device: (pushed IV pole) Gait Pattern/deviations: Decreased step length - right;Decreased step length - left;Shuffle Gait velocity: decreased   General Gait Details: Pt with shuffeling gait pattern with IV pole.  Decreased balance as gait progressed and increased assistance from PT near end with turning. o2 at 2 L/min and 96% with 2-3/4  dyspnea.  Stairs            Wheelchair Mobility    Modified Rankin (Stroke Patients Only)       Balance Overall balance assessment: History of Falls                                           Pertinent Vitals/Pain Pain Assessment: No/denies pain    Home Living Family/patient expects to be discharged to:: Skilled nursing facility                 Additional Comments: He states he uses o2 prn and also states the containers are too small and he has to refill every hour    Prior Function Level of Independence: Independent         Comments: Feels unsteady and wants a rollator     Hand Dominance        Extremity/Trunk Assessment   Upper Extremity Assessment Upper Extremity Assessment: Generalized weakness    Lower Extremity Assessment Lower Extremity Assessment: Generalized weakness       Communication      Cognition Arousal/Alertness: Awake/alert Behavior During Therapy: WFL for tasks assessed/performed Overall Cognitive Status: Within Functional Limits for tasks assessed  General Comments General comments (skin integrity, edema, etc.): Pt with coughing, but unable to produce any mucous    Exercises     Assessment/Plan    PT Assessment Patient needs continued PT services  PT Problem List Decreased strength;Decreased activity tolerance;Decreased balance;Decreased mobility;Cardiopulmonary status limiting activity       PT Treatment Interventions DME instruction;Gait training;Functional mobility training;Balance training;Therapeutic exercise;Therapeutic activities;Patient/family education    PT Goals (Current goals can be found in the Care Plan section)  Acute Rehab PT Goals Patient Stated Goal: Get a rollator PT Goal Formulation: With patient Time For Goal Achievement: 04/23/17 Potential to Achieve Goals: Good    Frequency Min 3X/week   Barriers to discharge         Co-evaluation               AM-PAC PT "6 Clicks" Daily Activity  Outcome Measure Difficulty turning over in bed (including adjusting bedclothes, sheets and blankets)?: A Little Difficulty moving from lying on back to sitting on the side of the bed? : A Little Difficulty sitting down on and standing up from a chair with arms (e.g., wheelchair, bedside commode, etc,.)?: Unable Help needed moving to and from a bed to chair (including a wheelchair)?: A Little Help needed walking in hospital room?: A Little Help needed climbing 3-5 steps with a railing? : A Little 6 Click Score: 16    End of Session Equipment Utilized During Treatment: Gait belt Activity Tolerance: Patient limited by fatigue Patient left: in chair;with call bell/phone within reach;with chair alarm set Nurse Communication: Mobility status PT Visit Diagnosis: History of falling (Z91.81);Unsteadiness on feet (R26.81);Muscle weakness (generalized) (M62.81)    Time: 5830-9407 PT Time Calculation (min) (ACUTE ONLY): 23 min   Charges:   PT Evaluation $PT Eval Low Complexity: 1 Low PT Treatments $Gait Training: 8-22 mins   PT G Codes:        Jashon Ishida L. Tamala Julian, [place] Pager [phone] 04/16/2017   Galen Manila 04/16/2017, 10:53 AM

## 2017-04-16 NOTE — Progress Notes (Addendum)
Nutrition Follow-up  DOCUMENTATION CODES:   Non-severe (moderate) malnutrition in context of chronic illness, Underweight  INTERVENTION:   Recommend ordering new weight  Continue Ensure Enlive po BID, each supplement provides 350 kcal and 20 grams of protein   NUTRITION DIAGNOSIS:   Moderate Malnutrition related to cancer and cancer related treatments as evidenced by moderate fat depletion, moderate muscle depletion.   GOAL:   Patient will meet greater than or equal to 90% of their needs   MONITOR:   PO intake, Supplement acceptance, Weight trends  ASSESSMENT:   57 yo male admitted from nursing home on 3/21 with dysnpnea secondary to nosocomial pneumonia with history significant of stage IV adenocarcinoma of the lung with metastases to spine (on chemo), anemia, thrombocytopenia, COPD on 2-3L O2, CKD3, seizures, HTN, tobacco abuse. PMH erosive gastropathy, gastritis  Per MD note 3/26  CT scan showed large pleural effusion  Pt breathing improved s/p thoracentesis (1 L fluid removed)  Ongoing treatment plan pending pathology assessment of pleural fluid to determine if it is parapneumonic or malignancy-related   Consult for oncology requested  Spoke with pt who reports:   Appetite is good. Per meal history, pt is eating 75-100% of meals  He is drinking Ensure and would like to keep getting them; however, medical record indicates pt is refusing them.  He vomited this morning. Denies nausea, stating "it just came up". Helped pt clean up vomit which did not appear to contain any food (clear fluid mixed with some blood, possibly from lungs). Spoke with RN who reports that pt received zofran for nausea earlier this morning.   Medications: prednisone, protonix, senokot, zofran  Labs reviewed.  NUTRITION - FOCUSED PHYSICAL EXAM:    Most Recent Value  Orbital Region  No depletion  Upper Arm Region  Moderate depletion  Thoracic and Lumbar Region  Moderate depletion   Buccal Region  No depletion  Temple Region  Mild depletion  Clavicle Bone Region  Moderate depletion  Clavicle and Acromion Bone Region  Moderate depletion  Scapular Bone Region  Mild depletion  Dorsal Hand  Moderate depletion  Patellar Region  Moderate depletion  Anterior Thigh Region  Moderate depletion  Posterior Calf Region  Moderate depletion  Edema (RD Assessment)  Moderate  Hair  Reviewed  Eyes  Reviewed  Mouth  Reviewed  Skin  Reviewed  Nails  Reviewed       Diet Order:  Diet regular Room service appropriate? Yes; Fluid consistency: Thin  EDUCATION NEEDS:   No education needs have been identified at this time  Skin:  Skin Assessment: Reviewed RN Assessment  Last BM:  3/26  Height:   Ht Readings from Last 1 Encounters:  04/10/17 5\' 11"  (1.803 m)    Weight:   Wt Readings from Last 1 Encounters:  04/10/17 127 lb (57.6 kg)    Ideal Body Weight:  78.1 kg  BMI:  Body mass index is 17.71 kg/m.  Estimated Nutritional Needs:   Kcal:  1,900-2,100 kcal (33-36 kcal/kg rounded; active cancer therapy)  Protein:  95-105 grams (20% kcal)  Fluid:  >=1.9 L/day    Edmonia Lynch Dietetic Intern Pager: [phone]

## 2017-04-17 LAB — CBC
HEMATOCRIT: 27.4 % — AB (ref 39.0–52.0)
Hemoglobin: 8.8 g/dL — ABNORMAL LOW (ref 13.0–17.0)
MCH: 33.2 pg (ref 26.0–34.0)
MCHC: 32.1 g/dL (ref 30.0–36.0)
MCV: 103.4 fL — AB (ref 78.0–100.0)
PLATELETS: 318 10*3/uL (ref 150–400)
RBC: 2.65 MIL/uL — ABNORMAL LOW (ref 4.22–5.81)
RDW: 16.5 % — AB (ref 11.5–15.5)
WBC: 16.9 10*3/uL — ABNORMAL HIGH (ref 4.0–10.5)

## 2017-04-17 LAB — BASIC METABOLIC PANEL
Anion gap: 8 (ref 5–15)
BUN: 30 mg/dL — ABNORMAL HIGH (ref 6–20)
CO2: 37 mmol/L — AB (ref 22–32)
Calcium: 9.3 mg/dL (ref 8.9–10.3)
Chloride: 100 mmol/L — ABNORMAL LOW (ref 101–111)
Creatinine, Ser: 1.41 mg/dL — ABNORMAL HIGH (ref 0.61–1.24)
GFR calc Af Amer: >60 mL/min (ref >60)
GFR, EST NON AFRICAN AMERICAN: 54 mL/min — AB (ref >60)
GLUCOSE: 71 mg/dL (ref 65–99)
POTASSIUM: 4.3 mmol/L (ref 3.5–5.1)
Sodium: 145 mmol/L (ref 135–145)

## 2017-04-17 MED ORDER — DOXYCYCLINE HYCLATE 100 MG PO TABS: 100.0000 mg | ORAL_TABLET | Freq: Two times a day (BID) | ORAL | Status: DC

## 2017-04-17 MED ORDER — ESCITALOPRAM OXALATE 5 MG PO TABS: 5.0000 mg | ORAL_TABLET | Freq: Every day | ORAL | 0 refills | Status: DC

## 2017-04-17 MED ORDER — HEPARIN SOD (PORK) LOCK FLUSH 100 UNIT/ML IV SOLN
500.0000 [IU] | INTRAVENOUS | Status: AC | PRN
  Administered 2017-04-17: 500 [IU]

## 2017-04-17 MED ORDER — PREDNISONE 10 MG PO TABS: 10.0000 mg | ORAL_TABLET | Freq: Every day | ORAL | 0 refills | Status: DC

## 2017-04-17 MED ORDER — GUAIFENESIN-DM 100-10 MG/5ML PO SYRP: 5.0000 mL | ORAL_SOLUTION | ORAL | 0 refills | Status: DC | PRN

## 2017-04-17 MED ORDER — FUROSEMIDE 40 MG PO TABS: 40.0000 mg | ORAL_TABLET | Freq: Every day | ORAL | 0 refills | Status: DC

## 2017-04-17 MED ORDER — DOXYCYCLINE HYCLATE 100 MG PO TABS: 100.0000 mg | ORAL_TABLET | Freq: Two times a day (BID) | ORAL | 0 refills | Status: AC

## 2017-04-17 NOTE — Discharge Summary (Signed)
Physician Discharge Summary  [name] FXT:[id number] DOB: 09-26-1960 DOA: 04/10/2017  PCP: Patient, No Pcp Per  Admit date: 04/10/2017 Discharge date: 04/17/2017  Admitted From:ALF Disposition:ALF  Recommendations for Outpatient Follow-up:  1. Follow up with PCP in 1-2 weeks 2. Please obtain BMP/CBC in one week 3. 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 Patient is from nursing home facility. ED evaluation he noted to have increasing oxygen requirement from his baseline. Patient was admitted with working diagnosis of healthcare associated pneumonia and started on broad-spectrum antibiotics. Also was noted to have elevated creatinine for which he was started on gentle hydration on admission. CT of the chest was obtained and showed large pleural effusion, P CCM was consulted and patient underwent thoracentesis by IR.  #Acute on chronic respiratory failure due to possible HCAP/pleural effusion/COPD. -Patient was initially treated with vancomycin and cefepime, MRSA negative therefore antibiotics were changed to doxycycline.  Patient underwent thoracocentesis on March 26, cultures negative so far.  Recommended to follow-up with oncologist for the cytology test.  Patient was evaluated by pulmonologist.  Treated with IV Solu-Medrol, antibiotics and bronchodilators.  Received IV Lasix.  Now his shortness of breath has improved.  Change to oral doxycycline, tapering prednisone and oral Lasix every day.  Recommended low-salt diet.  He is on room air.  Reported shortness of breath has significantly improved.  On  cough medication.  He will follow-up with his PCP and oncologist.  -PT OT recommended SNF.  Discussed with the patient and social worker, some problem with insurance going to SNF. As per social worker patient will go to ALF.  Home care services ordered.  #COPD exacerbation: Continue steroid, bronchodilators.  Improved  #Chronic kidney disease stage III: Creatinine stable.  Has lower extremity edema therefore started on oral Lasix.  Recommended to check BMP in a week.  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  #Hypertension: On Norvasc, diuretics, metoprolol.  Patient is clinically stable.  On room air.  No shortness of breath.  Needs close monitoring and follow-up.  Home care services ordered.  Stable on discharge.   Discharge Diagnoses:  Principal Problem:   HCAP (healthcare-associated pneumonia) Active Problems:   Seizure ([place])   Tobacco abuse   Adenocarcinoma of left lung, stage 4 (HCC)   Spine metastasis (HCC)   COPD (chronic obstructive pulmonary disease) (HCC)   HTN (hypertension)   Severe malnutrition (HCC)   CKD (chronic kidney disease), stage III (HCC)   Chronic respiratory failure with hypoxia [facility])    Discharge Instructions  Discharge Instructions    Call MD for:  difficulty breathing, headache or visual disturbances   Complete by:  As directed    Call MD for:  extreme fatigue   Complete by:  As directed    Call MD for:  hives   Complete by:  As directed    Call MD for:  persistant dizziness or light-headedness   Complete by:  As directed    Call MD for:  persistant nausea and vomiting   Complete by:  As directed    Call MD for:  severe uncontrolled pain   Complete by:  As directed    Call  MD for:  temperature >100.4   Complete by:  As directed    Diet - low sodium heart healthy   Complete by:  As directed    Increase activity slowly   Complete by:  As directed      Allergies as of 04/17/2017   No  Known Allergies     Medication List    STOP taking these medications   bisoprolol 5 MG tablet Commonly known as:  ZEBETA   mometasone-formoterol 100-5 MCG/ACT Aero Commonly known as:  DULERA   OXYGEN   tiotropium 18 MCG inhalation capsule Commonly known as:  SPIRIVA     TAKE these medications   acetaminophen 500 MG tablet Commonly known as:  TYLENOL Take 500 mg by mouth 2 (two) times daily as needed for mild pain or headache.   albuterol (2.5 MG/3ML) 0.083% nebulizer solution Commonly known as:  PROVENTIL 1 neb every 4-6 hours as needed for wheezing and shortness of breath What changed:    how much to take  how to take this  when to take this  reasons to take this  additional instructions   ALKA-SELTZER PLUS COLD PO Take 2 tablets by mouth at bedtime as needed (COUGH).   amLODipine 10 MG tablet Commonly known as:  NORVASC Take 10 mg by mouth daily.   dexamethasone 4 MG tablet Commonly known as:  DECADRON Take 4 mg by mouth as directed. Take 1 tablet twice daily the day before, the day of and the day after chemotherapy every 3 weeks.   doxycycline 100 MG tablet Commonly known as:  VIBRA-TABS Take 1 tablet (100 mg total) by mouth every 12 (twelve) hours for 5 days.   escitalopram 5 MG tablet Commonly known as:  LEXAPRO Take 1 tablet (5 mg total) by mouth at bedtime.   feeding supplement (ENSURE ENLIVE) Liqd Take 237 mLs by mouth 3 (three) times daily between meals.   fluconazole 100 MG tablet Commonly known as:  DIFLUCAN Take 1 tablet (100 mg total) by mouth daily. What changed:  additional instructions   Fluticasone-Umeclidin-Vilant 100-62.5-25 MCG/INH Aepb Commonly known as:  TRELEGY ELLIPTA Inhale 1 puff into the lungs daily.   furosemide 40 MG tablet Commonly known as:  LASIX Take 1 tablet (40 mg total) by mouth daily. What changed:    when to take this  reasons to take this   guaiFENesin 600 MG 12 hr tablet Commonly known as:   MUCINEX Take 1 tablet (600 mg total) by mouth 2 (two) times daily.   guaiFENesin-dextromethorphan 100-10 MG/5ML syrup Commonly known as:  ROBITUSSIN DM Take 5 mLs by mouth every 4 (four) hours as needed for cough.   Ipratropium-Albuterol 20-100 MCG/ACT Aers respimat Commonly known as:  COMBIVENT Inhale 1 puff into the lungs every 6 (six) hours.   lidocaine-prilocaine cream Commonly known as:  EMLA Apply 1 application topically as needed (for port access).   loratadine 10 MG tablet Commonly known as:  CLARITIN Take 10 mg by mouth daily.   metoprolol tartrate 25 MG tablet Commonly known as:  LOPRESSOR Take 25 mg by mouth 2 (two) times daily.   ondansetron 4 MG disintegrating tablet Commonly known as:  ZOFRAN-ODT Take 4 mg by mouth every 8 (eight) hours as needed for nausea or vomiting.   Oxycodone HCl 10 MG Tabs Take 1 tablet (10 mg total) by mouth every 4 (four) hours as needed (or shortness of breath). What changed:  reasons to take this   pantoprazole 40 MG tablet  Commonly known as:  PROTONIX Take 1 tablet (40 mg total) by mouth 2 (two) times daily.   polyethylene glycol packet Commonly known as:  MIRALAX / GLYCOLAX Take 17 g by mouth daily as needed for mild constipation.   predniSONE 10 MG tablet Commonly known as:  DELTASONE Take 1 tablet (10 mg total) by mouth daily. Take 3 tablets for 2 days, 2 tablets for 2 days, 1 tablet for 2 days and then stop.   SENNA-PLUS 8.6-50 MG tablet Generic drug:  senna-docusate Take 1 tablet by mouth at bedtime.      Follow-up Information    [facility]. Schedule an appointment as soon as possible for a visit in 1 week(s).   Why:  or follow up with your PCP Contact information: [place] [phone]       Curt Bears, MD Follow up.   Specialty:  Oncology Contact information: [place]  [phone]          No Known Allergies  Consultations: Pulmonary  Procedures/Studies: Thoracocentesis  Subjective: Seen and examined at bedside.  Denies headache, dizziness, nausea vomiting chest pain shortness of breath.  Cough is significantly better.  Her lower extremity edema is mildly improved.  Discharge Exam: Vitals:   04/17/17 0926 04/17/17 1059  BP:    Pulse:    Resp:    Temp:    SpO2: 93% 94%   Vitals:   04/16/17 2013 04/17/17 0532 04/17/17 0926 04/17/17 1059  BP: 121/74 118/61    Pulse: 95 79    Resp: (!) 36 (!) 22    Temp: 98.3 F (36.8 C) 98.7 F (37.1 C)    TempSrc: Oral Oral    SpO2: 100% 100% 93% 94%  Weight:      Height:        General: Pt is alert, awake, not in acute distress Cardiovascular: RRR, S1/S2 +, no rubs, no gallops Respiratory: CTA bilaterally, no wheezing, no rhonchi Abdominal: Soft, NT, ND, bowel sounds + Extremities: Bilateral lower extremity pitting edema improved compared to yesterday.    The results of significant diagnostics from this hospitalization (including imaging, microbiology, ancillary and laboratory) are listed below for reference.     Microbiology: Recent Results (from the past 240 hour(s))  Blood culture (routine x 2)     Status: None   Collection Time: 04/10/17 10:57 AM  Result Value Ref Range Status   Specimen Description   Final    BLOOD LEFT ANTECUBITAL Performed at [facility] [address], [place], [place] [address]    Special Requests   Final    BOTTLES DRAWN AEROBIC AND ANAEROBIC Blood Culture results may not be optimal due to an excessive volume of blood received in culture bottles Performed at [facility] [address] [place], [place] [address]    Culture   Final    NO GROWTH 5 DAYS Performed at [facility], [place] [address], [place], [place] [address]    Report Status 04/15/2017 FINAL  Final  Blood culture (routine x 2)      Status: None   Collection Time: 04/10/17 10:57 AM  Result Value Ref Range Status   Specimen Description   Final    BLOOD RIGHT PORTA CATH Performed at [facility] [address] [place],  [address]    Special Requests   Final    BOTTLES DRAWN AEROBIC  AND ANAEROBIC Blood Culture adequate volume Performed at [facility] [address] [place], [place] [address]    Culture   Final    NO GROWTH 5 DAYS Performed at [facility], [place] [address] [place], [place] [address]    Report Status 04/15/2017 FINAL  Final  Respiratory Panel by PCR     Status: None   Collection Time: 04/10/17  4:57 PM  Result Value Ref Range Status   Adenovirus NOT DETECTED NOT DETECTED Final   Coronavirus 229E NOT DETECTED NOT DETECTED Final   Coronavirus HKU1 NOT DETECTED NOT DETECTED Final   Coronavirus NL63 NOT DETECTED NOT DETECTED Final   Coronavirus OC43 NOT DETECTED NOT DETECTED Final   Metapneumovirus NOT DETECTED NOT DETECTED Final   Rhinovirus / Enterovirus NOT DETECTED NOT DETECTED Final   Influenza A NOT DETECTED NOT DETECTED Final   Influenza B NOT DETECTED NOT DETECTED Final   Parainfluenza Virus 1 NOT DETECTED NOT DETECTED Final   Parainfluenza Virus 2 NOT DETECTED NOT DETECTED Final   Parainfluenza Virus 3 NOT DETECTED NOT DETECTED Final   Parainfluenza Virus 4 NOT DETECTED NOT DETECTED Final   Respiratory Syncytial Virus NOT DETECTED NOT DETECTED Final   Bordetella pertussis NOT DETECTED NOT DETECTED Final   Chlamydophila pneumoniae NOT DETECTED NOT DETECTED Final   Mycoplasma pneumoniae NOT DETECTED NOT DETECTED Final    Comment: Performed at [facility], [place] [address] [place], [place] [address]  MRSA PCR Screening     Status: None   Collection Time: 04/11/17 11:38 AM  Result Value Ref Range Status   MRSA by PCR NEGATIVE NEGATIVE Final    Comment:        The GeneXpert MRSA Assay (FDA approved for NASAL specimens only), is  one component of a comprehensive MRSA colonization surveillance program. It is not intended to diagnose MRSA infection nor to guide or monitor treatment for MRSA infections. Performed at [facility], [place] [address], [place], [place] [address]   Body fluid culture     Status: None (Preliminary result)   Collection Time: 04/15/17 10:46 AM  Result Value Ref Range Status   Specimen Description   Final    PLEURAL RIGHT Performed at [facility] [address] [place], [place] [address]    Special Requests   Final    NONE Performed at [facility], [place] [address], [place], [place] [address]    Gram Stain   Final    RARE WBC PRESENT, PREDOMINANTLY MONONUCLEAR NO ORGANISMS SEEN    Culture   Final    NO GROWTH 2 DAYS Performed at [name], [place] [address], [place], [place] [address]    Report Status PENDING  Incomplete     Labs: BNP (last 3 results) Recent Labs    03/09/17 1719 04/10/17 0927 04/14/17 1900  BNP 28.6 49.4 160.7*   Basic Metabolic Panel: Recent Labs  Lab 04/11/17 0649 04/12/17 0451 04/13/17 0354 04/14/17 0443 04/15/17 0505 04/16/17 0358 04/17/17 0329  NA 139 139 141  --   --  145 145  K 4.2 4.6 4.4  --   --  4.9 4.3  CL 101 103 104  --   --  105 100*  CO2 29 27 26   --   --  31 37*  GLUCOSE 104* 144* 161*  --   --  105* 71  BUN 15 14 17   --   --  28* 30*  CREATININE 1.73* 1.65* 1.59* 1.60* 1.55* 1.41* 1.41*  CALCIUM 8.8* 9.1 9.5  --   --  9.8 9.3  MG  --  1.9  --   --   --  2.0  --    Liver Function Tests: Recent Labs  Lab 04/11/17 0649  AST 20  ALT 12*  ALKPHOS 98  BILITOT 0.2*  PROT 6.1*  ALBUMIN 2.3*   No results for input(s): LIPASE, AMYLASE in the last 168 hours. No results for input(s): AMMONIA in the last 168 hours. CBC: Recent Labs  Lab 04/11/17 0649 04/12/17 0451 04/13/17 0354 04/14/17 0443 04/16/17 0358 04/17/17 0329  WBC 5.7 7.5 13.9* 18.2* 18.0* 16.9*   NEUTROABS 3.9 6.9  --   --  15.5  --   HGB 7.0* 8.5* 8.6* 9.1* 8.7* 8.8*  HCT 21.1* 24.8* 26.3* 28.5* 27.3* 27.4*  MCV 104.5* 99.6 99.6 102.9* 104.6* 103.4*  PLT 106* 144* 204 289 309 318   Cardiac Enzymes: No results for input(s): CKTOTAL, CKMB, CKMBINDEX, TROPONINI in the last 168 hours. BNP: Invalid input(s): POCBNP CBG: No results for input(s): GLUCAP in the last 168 hours. D-Dimer No results for input(s): DDIMER in the last 72 hours. Hgb A1c No results for input(s): HGBA1C in the last 72 hours. Lipid Profile No results for input(s): CHOL, HDL, LDLCALC, TRIG, CHOLHDL, LDLDIRECT in the last 72 hours. Thyroid function studies No results for input(s): TSH, T4TOTAL, T3FREE, THYROIDAB in the last 72 hours.  Invalid input(s): FREET3 Anemia work up No results for input(s): VITAMINB12, FOLATE, FERRITIN, TIBC, IRON, RETICCTPCT in the last 72 hours. Urinalysis    Component Value Date/Time   COLORURINE YELLOW 04/10/2017 1735   APPEARANCEUR CLEAR 04/10/2017 1735   LABSPEC 1.011 04/10/2017 1735   PHURINE 6.0 04/10/2017 1735   GLUCOSEU NEGATIVE 04/10/2017 1735   HGBUR NEGATIVE 04/10/2017 1735   BILIRUBINUR NEGATIVE 04/10/2017 1735   KETONESUR NEGATIVE 04/10/2017 1735   PROTEINUR NEGATIVE 04/10/2017 1735   UROBILINOGEN 0.2 05/29/2011 0122   NITRITE NEGATIVE 04/10/2017 1735   LEUKOCYTESUR NEGATIVE 04/10/2017 1735   Sepsis Labs Invalid input(s): PROCALCITONIN,  WBC,  LACTICIDVEN Microbiology Recent Results (from the past 240 hour(s))  Blood culture (routine x 2)     Status: None   Collection Time: 04/10/17 10:57 AM  Result Value Ref Range Status   Specimen Description   Final    BLOOD LEFT ANTECUBITAL Performed at [facility], [place] [address] [place], [place] [address]    Special Requests   Final    BOTTLES DRAWN AEROBIC AND ANAEROBIC Blood Culture results may not be optimal due to an excessive volume of blood received in culture bottles Performed at  [facility] [address], [place], [place] [address]    Culture   Final    NO GROWTH 5 DAYS Performed at [facility], [place] [address] [place], [place] [address]    Report Status 04/15/2017 FINAL  Final  Blood culture (routine x 2)     Status: None   Collection Time: 04/10/17 10:57 AM  Result Value Ref Range Status   Specimen Description   Final    BLOOD RIGHT PORTA CATH Performed at [facility] [address], [place], [place] [address]    Special Requests   Final    BOTTLES DRAWN AEROBIC AND ANAEROBIC Blood Culture adequate volume Performed at [facility] [address], [place], [place] [address]    Culture  Final    NO GROWTH 5 DAYS Performed at [facility], [place] [address], [place], [place] [address]    Report Status 04/15/2017 FINAL  Final  Respiratory Panel by PCR     Status: None   Collection Time: 04/10/17  4:57 PM  Result Value Ref Range Status   Adenovirus NOT DETECTED NOT DETECTED Final   Coronavirus 229E NOT DETECTED NOT DETECTED Final   Coronavirus HKU1 NOT DETECTED NOT DETECTED Final   Coronavirus NL63 NOT DETECTED NOT DETECTED Final   Coronavirus OC43 NOT DETECTED NOT DETECTED Final   Metapneumovirus NOT DETECTED NOT DETECTED Final   Rhinovirus / Enterovirus NOT DETECTED NOT DETECTED Final   Influenza A NOT DETECTED NOT DETECTED Final   Influenza B NOT DETECTED NOT DETECTED Final   Parainfluenza Virus 1 NOT DETECTED NOT DETECTED Final   Parainfluenza Virus 2 NOT DETECTED NOT DETECTED Final   Parainfluenza Virus 3 NOT DETECTED NOT DETECTED Final   Parainfluenza Virus 4 NOT DETECTED NOT DETECTED Final   Respiratory Syncytial Virus NOT DETECTED NOT DETECTED Final   Bordetella pertussis NOT DETECTED NOT DETECTED Final   Chlamydophila pneumoniae NOT DETECTED NOT DETECTED Final   Mycoplasma pneumoniae NOT DETECTED NOT DETECTED Final    Comment: Performed at [facility], [place] [address] [place], [place] [address]  MRSA PCR Screening     Status: None   Collection Time: 04/11/17 11:38 AM  Result Value Ref Range Status   MRSA by PCR NEGATIVE NEGATIVE Final    Comment:        The GeneXpert MRSA Assay (FDA approved for NASAL specimens only), is one component of a comprehensive MRSA colonization surveillance program. It is not intended to diagnose MRSA infection nor to guide or monitor treatment for MRSA infections. Performed at [facility], [place] [address], [place], [place] [address]   Body fluid culture     Status: None (Preliminary result)   Collection Time: 04/15/17 10:46 AM  Result Value Ref Range Status   Specimen Description   Final    PLEURAL RIGHT Performed at [facility] [address] [place], [place] [address]    Special Requests   Final    NONE Performed at [facility], [place] [address] [place], [place] [address]    Gram Stain   Final    RARE WBC PRESENT, PREDOMINANTLY MONONUCLEAR NO ORGANISMS SEEN    Culture   Final    NO GROWTH 2 DAYS Performed at [facility], [place] [address] [place],  [address]    Report Status PENDING  Incomplete     Time coordinating discharge: 32 minutes  SIGNED:   Rosita Fire, MD  [facility] 04/17/2017, 11:30 AM  If 7PM-7AM, please contact night-coverage [email or web address] Password [email or web address]

## 2017-04-17 NOTE — Progress Notes (Addendum)
Orders noted for [facility]. Pt going back to ALF per CSW note. Pt already has home 02 and when offered choice, chose [facility] for home health services. [facility] rep alerted of referral. [facility] to provide HHPT/OT/Aide and CSW Marney Doctor RN,BSN,NCM [phone]

## 2017-04-17 NOTE — Progress Notes (Signed)
LCSW following for return to facility.  Patient from [name]. Patient will return to facility.   Patient will transport by [facility].   LCSW faxed over dc docs.  LCSW called facility to confirm return. No answer.   RN number for report: [phone]  [name] [employer] [phone]

## 2017-04-17 NOTE — Progress Notes (Signed)
LCSW following for facility placement.   Patient has not received bed offers/declined due to medicaid. Patient has no payor source for therapy.   Patient has oncology appointments that facility cannot accommodate.  LCSW made patient aware. Patient is agreeable to returning to ALF.   LCSW notified attending.   Carolin Coy [place]

## 2017-04-18 LAB — BODY FLUID CULTURE: Culture: NO GROWTH

## 2017-04-27 ENCOUNTER — Encounter (HOSPITAL_COMMUNITY): Payer: Self-pay | Admitting: Cardiology

## 2017-04-27 ENCOUNTER — Emergency Department (HOSPITAL_COMMUNITY)
Admission: EM | Admit: 2017-04-27 | Discharge: 2017-04-27 | Disposition: A | Discharge status: Home / Self Care | Payer: Medicaid Other | Attending: Emergency Medicine | Admitting: Emergency Medicine

## 2017-04-27 ENCOUNTER — Other Ambulatory Visit: Payer: Self-pay

## 2017-04-27 ENCOUNTER — Encounter (HOSPITAL_COMMUNITY): Payer: Self-pay | Admitting: Emergency Medicine

## 2017-04-27 ENCOUNTER — Emergency Department (HOSPITAL_COMMUNITY)
Admission: EM | Admit: 2017-04-27 | Discharge: 2017-04-28 | Disposition: A | Discharge status: Home / Self Care | Payer: Medicaid Other | Source: Home / Self Care | Attending: Emergency Medicine | Admitting: Emergency Medicine

## 2017-04-27 ENCOUNTER — Emergency Department (HOSPITAL_COMMUNITY): Payer: Medicaid Other

## 2017-04-27 DIAGNOSIS — J449 Chronic obstructive pulmonary disease, unspecified: Secondary | ICD-10-CM | POA: Diagnosis not present

## 2017-04-27 DIAGNOSIS — C3492 Malignant neoplasm of unspecified part of left bronchus or lung: Secondary | ICD-10-CM

## 2017-04-27 DIAGNOSIS — Z79899 Other long term (current) drug therapy: Secondary | ICD-10-CM | POA: Diagnosis not present

## 2017-04-27 DIAGNOSIS — C7802 Secondary malignant neoplasm of left lung: Secondary | ICD-10-CM | POA: Insufficient documentation

## 2017-04-27 DIAGNOSIS — R079 Chest pain, unspecified: Secondary | ICD-10-CM | POA: Diagnosis not present

## 2017-04-27 DIAGNOSIS — N183 Chronic kidney disease, stage 3 (moderate): Secondary | ICD-10-CM | POA: Insufficient documentation

## 2017-04-27 DIAGNOSIS — I129 Hypertensive chronic kidney disease with stage 1 through stage 4 chronic kidney disease, or unspecified chronic kidney disease: Secondary | ICD-10-CM | POA: Diagnosis not present

## 2017-04-27 DIAGNOSIS — F17210 Nicotine dependence, cigarettes, uncomplicated: Secondary | ICD-10-CM | POA: Insufficient documentation

## 2017-04-27 DIAGNOSIS — R072 Precordial pain: Secondary | ICD-10-CM

## 2017-04-27 LAB — CBC
HEMATOCRIT: 29.8 % — AB (ref 39.0–52.0)
HEMOGLOBIN: 9.4 g/dL — AB (ref 13.0–17.0)
MCH: 32.4 pg (ref 26.0–34.0)
MCHC: 31.5 g/dL (ref 30.0–36.0)
MCV: 102.8 fL — ABNORMAL HIGH (ref 78.0–100.0)
Platelets: 240 10*3/uL (ref 150–400)
RBC: 2.9 MIL/uL — AB (ref 4.22–5.81)
RDW: 18.5 % — ABNORMAL HIGH (ref 11.5–15.5)
WBC: 17.8 10*3/uL — ABNORMAL HIGH (ref 4.0–10.5)

## 2017-04-27 LAB — COMPREHENSIVE METABOLIC PANEL
ALT: 11 U/L — ABNORMAL LOW (ref 17–63)
AST: 18 U/L (ref 15–41)
Albumin: 2.8 g/dL — ABNORMAL LOW (ref 3.5–5.0)
Alkaline Phosphatase: 96 U/L (ref 38–126)
Anion gap: 9 (ref 5–15)
BUN: 20 mg/dL (ref 6–20)
CHLORIDE: 101 mmol/L (ref 101–111)
CO2: 29 mmol/L (ref 22–32)
Calcium: 8.8 mg/dL — ABNORMAL LOW (ref 8.9–10.3)
Creatinine, Ser: 1.84 mg/dL — ABNORMAL HIGH (ref 0.61–1.24)
GFR, EST AFRICAN AMERICAN: 45 mL/min — AB (ref >60)
GFR, EST NON AFRICAN AMERICAN: 39 mL/min — AB (ref >60)
Glucose, Bld: 131 mg/dL — ABNORMAL HIGH (ref 65–99)
POTASSIUM: 4 mmol/L (ref 3.5–5.1)
SODIUM: 139 mmol/L (ref 135–145)
Total Bilirubin: 0.6 mg/dL (ref 0.3–1.2)
Total Protein: 6.2 g/dL — ABNORMAL LOW (ref 6.5–8.1)

## 2017-04-27 LAB — I-STAT TROPONIN, ED
TROPONIN I, POC: 0 ng/mL (ref 0.00–0.08)
TROPONIN I, POC: 0 ng/mL (ref 0.00–0.08)
Troponin i, poc: 0 ng/mL (ref 0.00–0.08)

## 2017-04-27 LAB — CBC WITH DIFFERENTIAL/PLATELET
BASOS ABS: 0 10*3/uL (ref 0.0–0.1)
Basophils Relative: 0 %
Eosinophils Absolute: 0 10*3/uL (ref 0.0–0.7)
Eosinophils Relative: 0 %
HEMATOCRIT: 29.8 % — AB (ref 39.0–52.0)
HEMOGLOBIN: 9.3 g/dL — AB (ref 13.0–17.0)
LYMPHS ABS: 1.7 10*3/uL (ref 0.7–4.0)
Lymphocytes Relative: 9 %
MCH: 32.5 pg (ref 26.0–34.0)
MCHC: 31.2 g/dL (ref 30.0–36.0)
MCV: 104.2 fL — ABNORMAL HIGH (ref 78.0–100.0)
Monocytes Absolute: 1.8 10*3/uL — ABNORMAL HIGH (ref 0.1–1.0)
Monocytes Relative: 10 %
NEUTROS ABS: 15.5 10*3/uL — AB (ref 1.7–7.7)
NEUTROS PCT: 81 %
PLATELETS: 200 10*3/uL (ref 150–400)
RBC: 2.86 MIL/uL — AB (ref 4.22–5.81)
RDW: 18.5 % — ABNORMAL HIGH (ref 11.5–15.5)
WBC: 19.2 10*3/uL — AB (ref 4.0–10.5)

## 2017-04-27 LAB — HEPATIC FUNCTION PANEL
ALBUMIN: 2.8 g/dL — AB (ref 3.5–5.0)
ALT: 12 U/L — AB (ref 17–63)
AST: 20 U/L (ref 15–41)
Alkaline Phosphatase: 93 U/L (ref 38–126)
BILIRUBIN TOTAL: 0.4 mg/dL (ref 0.3–1.2)
Bilirubin, Direct: 0.1 mg/dL (ref 0.1–0.5)
Indirect Bilirubin: 0.3 mg/dL (ref 0.3–0.9)
Total Protein: 5.9 g/dL — ABNORMAL LOW (ref 6.5–8.1)

## 2017-04-27 LAB — BASIC METABOLIC PANEL
ANION GAP: 13 (ref 5–15)
BUN: 25 mg/dL — ABNORMAL HIGH (ref 6–20)
CHLORIDE: 98 mmol/L — AB (ref 101–111)
CO2: 30 mmol/L (ref 22–32)
Calcium: 9.1 mg/dL (ref 8.9–10.3)
Creatinine, Ser: 1.94 mg/dL — ABNORMAL HIGH (ref 0.61–1.24)
GFR calc Af Amer: 42 mL/min — ABNORMAL LOW (ref >60)
GFR calc non Af Amer: 37 mL/min — ABNORMAL LOW (ref >60)
GLUCOSE: 102 mg/dL — AB (ref 65–99)
POTASSIUM: 3.6 mmol/L (ref 3.5–5.1)
Sodium: 141 mmol/L (ref 135–145)

## 2017-04-27 LAB — LIPASE, BLOOD
LIPASE: 26 U/L (ref 11–51)
Lipase: 27 U/L (ref 11–51)

## 2017-04-27 MED ORDER — OXYCODONE-ACETAMINOPHEN 5-325 MG PO TABS
2.0000 | ORAL_TABLET | Freq: Once | ORAL | Status: AC
  Administered 2017-04-28: 2 via ORAL
  Filled 2017-04-27: qty 2

## 2017-04-27 MED ORDER — SODIUM CHLORIDE 0.9 % IV BOLUS
500.0000 mL | Freq: Once | INTRAVENOUS | Status: AC
  Administered 2017-04-27: 500 mL via INTRAVENOUS

## 2017-04-27 MED ORDER — MORPHINE SULFATE (PF) 4 MG/ML IV SOLN
4.0000 mg | Freq: Once | INTRAVENOUS | Status: AC
  Administered 2017-04-27: 4 mg via INTRAVENOUS
  Filled 2017-04-27: qty 1

## 2017-04-27 MED ORDER — ONDANSETRON HCL 4 MG/2ML IJ SOLN
4.0000 mg | Freq: Once | INTRAMUSCULAR | Status: AC
  Administered 2017-04-27: 4 mg via INTRAVENOUS
  Filled 2017-04-27: qty 2

## 2017-04-27 NOTE — Discharge Instructions (Signed)
Please read and follow all provided instructions.  Your diagnoses today include:  1. Precordial pain     Tests performed today include:  An EKG of your heart  A chest x-ray  Cardiac enzymes - a blood test for heart muscle damage  Blood counts and electrolytes - showed weaker than normal kidney function, call your doctor tomorrow to get this rechecked in the next 1 week  Vital signs. See below for your results today.   Medications prescribed:   None  Take any prescribed medications only as directed.  Follow-up instructions: Please follow-up with your primary care provider as soon as you can for further evaluation of your symptoms.   Return instructions:  SEEK IMMEDIATE MEDICAL ATTENTION IF:  You have severe chest pain, especially if the pain is crushing or pressure-like and spreads to the arms, back, neck, or jaw, or if you have sweating, nausea (feeling sick to your stomach), or shortness of breath. THIS IS AN EMERGENCY. Don't wait to see if the pain will go away. Get medical help at once. Call 911 or 0 (operator). DO NOT drive yourself to the hospital.   Your chest pain gets worse and does not go away with rest.   You have an attack of chest pain lasting longer than usual, despite rest and treatment with the medications your caregiver has prescribed.   You wake from sleep with chest pain or shortness of breath.  You feel dizzy or faint.  You have chest pain not typical of your usual pain for which you originally saw your caregiver.   You have any other emergent concerns regarding your health.  Additional Information: Chest pain comes from many different causes. Your caregiver has diagnosed you as having chest pain that is not specific for one problem, but does not require admission.  You are at low risk for an acute heart condition or other serious illness.   Your vital signs today were: BP 100/67    Pulse 68    Temp 98.1 F (36.7 C) (Oral)    Resp 20    Ht 5\' 11"   (1.803 m)    Wt 57.6 kg (127 lb)    SpO2 99%    BMI 17.71 kg/m  If your blood pressure (BP) was elevated above 135/85 this visit, please have this repeated by your doctor within one month. --------------

## 2017-04-27 NOTE — ED Provider Notes (Signed)
[facility] EMERGENCY DEPARTMENT Provider Note   CSN: [id number] Arrival date & time: 04/27/17  3016     History   Chief Complaint Chief Complaint  Patient presents with  . Chest Pain    HPI [name] is a 57 y.o. male.  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  He complains of intermittent short-lived chest pains in the middle chest and upper abdomen over the past 2 days.  When the pain occurs, it is sharp, short-lived lasting a few seconds.  Patient is on chronic oxygen but does not feel more short of breath than normal.  Overall, he states that his breathing "feels a little bit better" since his hospitalization.  He has upper abdominal pain "for months" that is unchanged.  He also has lower extremity edema for the past several months which is also at baseline. No fever, N/V/D. Currently on Lasix 40mg  daily.      Past Medical History:  Diagnosis Date  . Abdominal pain 06/04/2016  . Adenocarcinoma of left lung, stage 4 ([place]) 05/02/2016  . Alcohol abuse   . Bronchitis due to tobacco use ([place])   . Cancer ([place])    Lung  . COPD (chronic obstructive pulmonary disease) ([place])   . Dehydration 06/04/2016  . Encounter for antineoplastic chemotherapy 05/02/2016  . Gastritis   . Goals of care, counseling/discussion 05/02/2016  . Hematemesis   . HTN (hypertension) 10/30/2016  . Recurrent upper respiratory infection (URI)   . Seizures ([place]) 05/2011   new onset  . Shortness of breath     Patient Active Problem List   Diagnosis Date Noted  . Chronic respiratory failure with hypoxia ([place]) 04/01/2017  . Leucocytosis 03/16/2017  . CKD (chronic kidney disease), stage III ([place]) 03/12/2017  . Malnutrition of moderate degree 03/10/2017  . COPD with acute exacerbation  ([place]) 03/09/2017  . Anemia 02/07/2017  . Severe malnutrition ([place]) 01/03/2017  . DOE (dyspnea on exertion) 12/30/2016  . Nausea   . Hypervolemia   . HCAP (healthcare-associated pneumonia) 12/19/2016  . Dyspnea and respiratory abnormality 12/19/2016  . Erosive gastropathy 12/18/2016  . Gastritis 12/18/2016  . Hematemesis 12/16/2016  . Intractable nausea and vomiting 12/16/2016  . HTN (hypertension) 10/30/2016  . Chronic obstructive pulmonary disease ([place]) 09/02/2016  . COPD (chronic obstructive pulmonary disease) ([place]) 08/19/2016  . Dehydration 06/04/2016  . Abdominal pain 06/04/2016  . 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 Adenocarcinoma of left lung, stage 4 ([place]) 05/02/2016  . Goals of care, counseling/discussion 05/02/2016  . Encounter for antineoplastic chemotherapy 05/02/2016  . Tobacco abuse 05/30/2011  . Alcohol abuse 05/30/2011  . Seizure ([place]) 05/28/2011    Past Surgical History:  Procedure Laterality Date  . ESOPHAGOGASTRODUODENOSCOPY (EGD) WITH PROPOFOL N/A 12/17/2016   Procedure: ESOPHAGOGASTRODUODENOSCOPY (EGD) WITH PROPOFOL;  Surgeon: Ladene Artist, MD;  Location: [facility] ENDOSCOPY;  Service: Endoscopy;  Laterality: N/A;  . IR FLUORO GUIDE PORT INSERTION RIGHT  05/13/2016  . IR US GUIDE VASC ACCESS RIGHT  05/13/2016  . NO PAST SURGERIES          Home Medications    Prior to Admission medications   Medication Sig Start Date End Date Taking? Authorizing Provider  acetaminophen (TYLENOL) 500 MG tablet Take 500 mg by mouth 2 (two) times daily as needed for mild  pain or headache.    [provider]  albuterol (PROVENTIL) (2.5 MG/3ML) 0.083% nebulizer solution 1 neb every 4-6 hours as needed for wheezing and shortness of breath Patient taking differently: Take 2.5 mg by nebulization every 4 (four) hours as needed for wheezing or shortness of breath.  04/01/17   Parrett, Fonnie Mu, NP  amLODipine (NORVASC) 10 MG tablet Take 10 mg by mouth daily.    [provider]  Chlorphen-Phenyleph-ASA (ALKA-SELTZER PLUS COLD PO) Take 2 tablets by mouth at bedtime as needed (COUGH).    [provider]  dexamethasone (DECADRON) 4 MG tablet Take 4 mg by mouth as directed. Take 1 tablet twice daily the day before, the day of and the day after chemotherapy every 3 weeks.    [provider]  escitalopram (LEXAPRO) 5 MG tablet Take 1 tablet (5 mg total) by mouth at bedtime. 04/17/17   Rosita Fire, MD  feeding supplement, ENSURE ENLIVE, (ENSURE ENLIVE) LIQD Take 237 mLs by mouth 3 (three) times daily between meals. 03/19/17   Shelly Coss, MD  fluconazole (DIFLUCAN) 100 MG tablet Take 1 tablet (100 mg total) by mouth daily. Patient taking differently: Take 100 mg by mouth daily. Continuous 03/20/17   Shelly Coss, MD  Fluticasone-Umeclidin-Vilant (TRELEGY ELLIPTA) 100-62.5-25 MCG/INH AEPB Inhale 1 puff into the lungs daily. 04/01/17   Parrett, Fonnie Mu, NP  furosemide (LASIX) 40 MG tablet Take 1 tablet (40 mg total) by mouth daily. 04/17/17   Rosita Fire, MD  guaiFENesin (MUCINEX) 600 MG 12 hr tablet Take 1 tablet (600 mg total) by mouth 2 (two) times daily. 03/19/17   Shelly Coss, MD  guaiFENesin-dextromethorphan (ROBITUSSIN DM) 100-10 MG/5ML syrup Take 5 mLs by mouth every 4 (four) hours as needed for cough. 04/17/17   Rosita Fire, MD  Ipratropium-Albuterol (COMBIVENT) 20-100 MCG/ACT AERS respimat Inhale 1 puff into the lungs every 6 (six) hours. Patient not taking: Reported on 04/07/2017 07/22/16   Curt Bears, MD  lidocaine-prilocaine (EMLA) cream Apply 1 application topically as needed (for port access).     [provider]  loratadine (CLARITIN) 10 MG tablet Take 10 mg by mouth daily.    [provider]  metoprolol tartrate (LOPRESSOR) 25 MG tablet Take 25 mg by mouth 2 (two) times daily.    [provider]  ondansetron (ZOFRAN-ODT) 4 MG disintegrating tablet Take 4 mg by mouth  every 8 (eight) hours as needed for nausea or vomiting.    [provider]  Oxycodone HCl 10 MG TABS Take 1 tablet (10 mg total) by mouth every 4 (four) hours as needed (or shortness of breath). Patient taking differently: Take 10 mg by mouth every 4 (four) hours as needed (for severe pain or shortness of breath).  04/08/17   Curt Bears, MD  pantoprazole (PROTONIX) 40 MG tablet Take 1 tablet (40 mg total) by mouth 2 (two) times daily. 12/18/16   Eugenie Filler, MD  polyethylene glycol [facility] / Floria Raveling) packet Take 17 g by mouth daily as needed for mild constipation. 03/19/17   Shelly Coss, MD  predniSONE (DELTASONE) 10 MG tablet Take 1 tablet (10 mg total) by mouth daily. Take 3 tablets for 2 days, 2 tablets for 2 days, 1 tablet for 2 days and then stop. 04/17/17   Rosita Fire, MD  senna-docusate (SENNA-PLUS) 8.6-50 MG tablet Take 1 tablet by mouth at bedtime.    [provider]    Family History Family History  Problem  Relation Age of Onset  . Cancer Father   . Diabetes Mellitus II Mother     Social History Social History   Tobacco Use  . Smoking status: Current Some Day Smoker    Packs/day: 0.10    Years: 30.00    Pack years: 3.00    Types: Cigarettes  . Smokeless tobacco: Never Used  . Tobacco comment: 1-2 cig a day   Substance Use Topics  . Alcohol use: No    Frequency: Never  . Drug use: No     Allergies   Patient has no known allergies.   Review of Systems Review of Systems  Constitutional: Negative for diaphoresis and fever.  Eyes: Negative for redness.  Respiratory: Negative for cough and shortness of breath.   Cardiovascular: Positive for chest pain and leg swelling (chronic). Negative for palpitations.  Gastrointestinal: Positive for abdominal pain (chronic). Negative for nausea and vomiting.  Genitourinary: Negative for dysuria.  Musculoskeletal: Negative for back pain and neck pain.  Skin: Negative for rash.    Neurological: Negative for syncope and light-headedness.  Psychiatric/Behavioral: The patient is not nervous/anxious.      Physical Exam Updated Vital Signs BP 114/82 (BP Location: Right Arm)   Pulse 84   Temp 98.1 F (36.7 C) (Oral)   Resp 16   Ht 5\' 11"  (1.803 m)   Wt 57.6 kg (127 lb)   SpO2 96%   BMI 17.71 kg/m   Physical Exam  Constitutional: He appears well-developed and well-nourished.  HENT:  Head: Normocephalic and atraumatic.  Mouth/Throat: Oropharynx is clear and moist.  Eyes: Conjunctivae are normal. Right eye exhibits no discharge. Left eye exhibits no discharge.  Neck: Normal range of motion. Neck supple.  Cardiovascular: Normal rate, regular rhythm and normal heart sounds.  No murmur heard. Pulmonary/Chest: Effort normal and breath sounds normal. He has no decreased breath sounds. He has no wheezes. He has no rales. He exhibits no tenderness.  Abdominal: Soft. There is tenderness (upper abdomen, mild-moderate). There is no rebound and no guarding.  Musculoskeletal: He exhibits edema. He exhibits no tenderness.  2+ pitting bilateral to knees  Neurological: He is alert.  Skin: Skin is warm and dry.  Psychiatric: He has a normal mood and affect.  Nursing note and vitals reviewed.    ED Treatments / Results  Labs (all labs ordered are listed, but only abnormal results are displayed) Labs Reviewed  BASIC METABOLIC PANEL - Abnormal; Notable for the following components:      Result Value   Chloride 98 (*)    Glucose, Bld 102 (*)    BUN 25 (*)    Creatinine, Ser 1.94 (*)    GFR calc non Af Amer 37 (*)    GFR calc Af Amer 42 (*)    All other components within normal limits  CBC - Abnormal; Notable for the following components:   WBC 17.8 (*)    RBC 2.90 (*)    Hemoglobin 9.4 (*)    HCT 29.8 (*)    MCV 102.8 (*)    RDW 18.5 (*)    All other components within normal limits  HEPATIC FUNCTION PANEL - Abnormal; Notable for the following components:    Total Protein 5.9 (*)    Albumin 2.8 (*)    ALT 12 (*)    All other components within normal limits  LIPASE, BLOOD  I-STAT TROPONIN, ED  I-STAT TROPONIN, ED    ED ECG REPORT   Date: 04/27/2017  Rate:  87  Rhythm: normal sinus rhythm  QRS Axis: right  Intervals: normal  ST/T Wave abnormalities: non-specific ST/T changes  Conduction Disutrbances:none  Narrative Interpretation:   Old EKG Reviewed: changes noted slower now from 2/19  I have personally reviewed the EKG tracing and agree with the computerized printout as noted.  Radiology Dg Chest 2 View  Result Date: 04/27/2017 CLINICAL DATA:  Chronic shortness of breath over the past 5 months. Acute onset chest pain 2 days ago. Current history of stage IV adenocarcinoma of the LEFT upper lobe for which the patient has completed radiation therapy and is currently receiving chemotherapy. Current smoker. EXAM: CHEST - 2 VIEW COMPARISON:  04/16/2017, 04/15/2017 and earlier, including CT chest 04/13/2017 and earlier. FINDINGS: AP ERECT and LATERAL images were obtained. Cardiac silhouette normal in size, unchanged. Prominent central pulmonary arteries bilaterally as noted previously. Hilar and mediastinal contours otherwise unremarkable. The previously identified mass extending from the LEFT hilum into the LEFT upper lobe is again noted, but is much less conspicuous on the x-ray. Emphysematous changes throughout both lungs and fibrosis in the LOWER lobes, unchanged. Stable small BILATERAL pleural effusions. No new pulmonary parenchymal abnormalities. RIGHT jugular Port-A-Cath tip at the cavoatrial junction. Visualized bony thorax intact. IMPRESSION: 1. No new/acute cardiopulmonary disease. 2. LEFT upper lobe lung mass which is less conspicuous on the x-ray. 3. Stable small BILATERAL pleural effusions. 4.  Emphysema (ICD10-J43.9). 5. Stable fibrosis involving the LOWER lobes. Electronically Signed   By: Evangeline Dakin M.D.   On: 04/27/2017 08:01     Procedures Procedures (including critical care time)  Medications Ordered in ED Medications  sodium chloride 0.9 % bolus 500 mL (500 mLs Intravenous New Bag/Given 04/27/17 0908)  morphine 4 MG/ML injection 4 mg (4 mg Intravenous Given 04/27/17 0908)  ondansetron (ZOFRAN) injection 4 mg (4 mg Intravenous Given 04/27/17 0908)     Initial Impression / Assessment and Plan / ED Course  I have reviewed the triage vital signs and the nursing notes.  Pertinent labs & imaging results that were available during my care of the patient were reviewed by me and considered in my medical decision making (see chart for details).     Patient seen and examined. Work-up initiated. Transporter is taking him to x-ray.    Vital signs reviewed and are as follows: BP 114/82 (BP Location: Right Arm)   Pulse 84   Temp 98.1 F (36.7 C) (Oral)   Resp 16   Ht 5\' 11"  (1.803 m)   Wt 57.6 kg (127 lb)   SpO2 96%   BMI 17.71 kg/m   9:00 AM Patient discussed with and seen by Dr. Jeanell Sparrow.   Will ask cardiology to eval for ongoing CP. Feel symptoms are atypical for PE.   Morphine/zofran ordered for pain. IV fluids for mild AKI.   9:11 AM Repeat EKG shows some mild elevation inferiorly, reviewed with Dr. Jeanell Sparrow. She has seen patient. Would not call STEMI, but will ask for cards consult.   10:58 AM Cards has seen and evaluated. Their impression is that this is chest wall pain, asked to check second troponin. They are comfortable with d/c with PCP f/u if negative.   11:50 AM Repeat EKG unchanged and trop negative. Pt stable. Will d/c to home. Discussed need for kidney function recheck in upcoming week.   ED ECG REPORT   Date: 04/27/2017  Rate: 81  Rhythm: normal sinus rhythm  QRS Axis: right  Intervals: normal  ST/T Wave abnormalities: nonspecific ST/T  changes  Conduction Disutrbances:none  Narrative Interpretation:   Old EKG Reviewed: unchanged  I have personally reviewed the EKG tracing and agree with the  computerized printout as noted.  11:53 AM Patient was counseled to return with severe chest pain, especially if the pain is crushing or pressure-like and spreads to the arms, back, neck, or jaw, or if they have sweating, nausea, or shortness of breath with the pain. They were encouraged to call 911 with these symptoms.   They were also told to return if their chest pain gets worse and does not go away with rest, they have an attack of chest pain lasting longer than usual despite rest and treatment with the medications their caregiver has prescribed, if they wake from sleep with chest pain or shortness of breath, if they feel dizzy or faint, if they have chest pain not typical of their usual pain, or if they have any other emergent concerns regarding their health.  The patient verbalized understanding and agreed.    Final Clinical Impressions(s) / ED Diagnoses   Final diagnoses:  Precordial pain   Patient with vague intermittent chest pains, history of lung cancer.  Patient seen by cardiology, pain seems to be reproducible.  Troponin negative x2 and EKG unchanged during ED stay.  No tachycardia, hypoxia or shortness of breath to suggest PE today.  No clinical signs and symptoms of DVT.  Bilateral leg swelling is symmetric and unchanged.  Chest x-ray findings are stable today.  Hemoglobin slightly better than baseline.  Creatinine slightly higher than baseline -patient instructed to have this rechecked in the next week to ensure that this is not worsening.  Do not feel this warrants admission today.  Patient does not appear to be profoundly dehydrated.  ED Discharge Orders    None       Carlisle Cater, Hershal Coria 04/27/17 1157    Pattricia Boss, MD 04/27/17 [phone]

## 2017-04-27 NOTE — Consult Note (Addendum)
Cardiology Consultation:   Patient ID: [name]; [id number]; 12/28/1960   Admit date: 04/27/2017 Date of Consult: 04/27/2017  Primary Care Provider: Patient, No Pcp Per Primary Cardiologist: No primary care provider on file. NEW Primary Electrophysiologist:  NA   Patient Profile:   [name] is a 57 y.o. male with a hx of COPD and metastatic lung cancer--stage IV, with palliative radiotherapy to T12 and L3 - chronic hypoxia and now home 02 , + tobacco,  Seizures, HTN who is being seen today for the evaluation of chest pain at the request of Dr. Jeanell Sparrow.  History of Present Illness:   [name] a hx of COPD and metastatic lung cancer--stage IV, with palliative radiotherapy to T12 and L3 - chronic hypoxia and now home 02 , + tobacco,  Seizures, HTN and now presents to ER with chest pain.   Described as substernal going on for 2 days and no increase in his SOB.    EKG SR with ant Q waves, old  BNP 155 Troponin POC 0.00 Na 141, K+ 3.6, Cr 1.94 Hgb 9.4,  WBC 17.8 plts 240   CXR    IMPRESSION: 1. No new/acute cardiopulmonary disease. 2. LEFT upper lobe lung mass which is less conspicuous on the x-ray. 3. Stable small BILATERAL pleural effusions. 4.  Emphysema (ICD10-J43.9). 5. Stable fibrosis involving the LOWER lobes  He had Rt thoracentesis on  04/15/17 with 1 L removed.   Currently after morphine and zofran pain is improved but some present.  He has pain all over frequently but this is different.  He has chronic abd pain, and lower ext edema is on lasix.  He is taking his meds as usual but does not know them.  One brother had a PPM but no cardiac hx that he is aware.   Past Medical History:  Diagnosis Date  . Abdominal pain 06/04/2016  . Adenocarcinoma of left lung, stage 4 ([place]) 05/02/2016  . Alcohol abuse   . Bronchitis due to tobacco use ([place])   . Cancer ([place])    Lung  . COPD (chronic obstructive pulmonary disease) ([place])   . Dehydration 06/04/2016  . Encounter for  antineoplastic chemotherapy 05/02/2016  . Gastritis   . Goals of care, counseling/discussion 05/02/2016  . Hematemesis   . HTN (hypertension) 10/30/2016  . Recurrent upper respiratory infection (URI)   . Seizures ([place]) 05/2011   new onset  . Shortness of breath     Past Surgical History:  Procedure Laterality Date  . ESOPHAGOGASTRODUODENOSCOPY (EGD) WITH PROPOFOL N/A 12/17/2016   Procedure: ESOPHAGOGASTRODUODENOSCOPY (EGD) WITH PROPOFOL;  Surgeon: Ladene Artist, MD;  Location: [facility];  Service: Endoscopy;  Laterality: N/A;  . IR FLUORO GUIDE PORT INSERTION RIGHT  05/13/2016  . IR US GUIDE VASC ACCESS RIGHT  05/13/2016  . NO PAST SURGERIES       Home Medications:  Prior to Admission medications   Medication Sig Start Date End Date Taking? Authorizing Provider  acetaminophen (TYLENOL) 500 MG tablet Take 500 mg by mouth 2 (two) times daily as needed for mild pain or headache.    [provider]  albuterol (PROVENTIL) (2.5 MG/3ML) 0.083% nebulizer solution 1 neb every 4-6 hours as needed for wheezing and shortness of breath Patient taking differently: Take 2.5 mg by nebulization every 4 (four) hours as needed for wheezing or shortness of breath.  04/01/17   Parrett, Fonnie Mu, NP  amLODipine (NORVASC) 10 MG tablet Take 10 mg by mouth daily.  [provider]  Chlorphen-Phenyleph-ASA (ALKA-SELTZER PLUS COLD PO) Take 2 tablets by mouth at bedtime as needed (COUGH).    [provider]  dexamethasone (DECADRON) 4 MG tablet Take 4 mg by mouth as directed. Take 1 tablet twice daily the day before, the day of and the day after chemotherapy every 3 weeks.    [provider]  escitalopram (LEXAPRO) 5 MG tablet Take 1 tablet (5 mg total) by mouth at bedtime. 04/17/17   Rosita Fire, MD  feeding supplement, ENSURE ENLIVE, (ENSURE ENLIVE) LIQD Take 237 mLs by mouth 3 (three) times daily between meals. 03/19/17   Shelly Coss, MD  fluconazole (DIFLUCAN)  100 MG tablet Take 1 tablet (100 mg total) by mouth daily. Patient taking differently: Take 100 mg by mouth daily. Continuous 03/20/17   Shelly Coss, MD  Fluticasone-Umeclidin-Vilant (TRELEGY ELLIPTA) 100-62.5-25 MCG/INH AEPB Inhale 1 puff into the lungs daily. 04/01/17   Parrett, Fonnie Mu, NP  furosemide (LASIX) 40 MG tablet Take 1 tablet (40 mg total) by mouth daily. 04/17/17   Rosita Fire, MD  guaiFENesin (MUCINEX) 600 MG 12 hr tablet Take 1 tablet (600 mg total) by mouth 2 (two) times daily. 03/19/17   Shelly Coss, MD  guaiFENesin-dextromethorphan (ROBITUSSIN DM) 100-10 MG/5ML syrup Take 5 mLs by mouth every 4 (four) hours as needed for cough. 04/17/17   Rosita Fire, MD  Ipratropium-Albuterol (COMBIVENT) 20-100 MCG/ACT AERS respimat Inhale 1 puff into the lungs every 6 (six) hours. Patient not taking: Reported on 04/07/2017 07/22/16   Curt Bears, MD  lidocaine-prilocaine (EMLA) cream Apply 1 application topically as needed (for port access).     [provider]  loratadine (CLARITIN) 10 MG tablet Take 10 mg by mouth daily.    [provider]  metoprolol tartrate (LOPRESSOR) 25 MG tablet Take 25 mg by mouth 2 (two) times daily.    [provider]  ondansetron (ZOFRAN-ODT) 4 MG disintegrating tablet Take 4 mg by mouth every 8 (eight) hours as needed for nausea or vomiting.    [provider]  Oxycodone HCl 10 MG TABS Take 1 tablet (10 mg total) by mouth every 4 (four) hours as needed (or shortness of breath). Patient taking differently: Take 10 mg by mouth every 4 (four) hours as needed (for severe pain or shortness of breath).  04/08/17   Curt Bears, MD  pantoprazole (PROTONIX) 40 MG tablet Take 1 tablet (40 mg total) by mouth 2 (two) times daily. 12/18/16   Eugenie Filler, MD  polyethylene glycol [facility] Floria Raveling) packet Take 17 g by mouth daily as needed for mild constipation. 03/19/17   Shelly Coss, MD  predniSONE  (DELTASONE) 10 MG tablet Take 1 tablet (10 mg total) by mouth daily. Take 3 tablets for 2 days, 2 tablets for 2 days, 1 tablet for 2 days and then stop. 04/17/17   Rosita Fire, MD  senna-docusate (SENNA-PLUS) 8.6-50 MG tablet Take 1 tablet by mouth at bedtime.    [provider]    Inpatient Medications: Scheduled Meds:  Continuous Infusions:  PRN Meds:   Allergies:   No Known Allergies  Social History:   Social History   Socioeconomic History  . Marital status: Single    Spouse name: Not on file  . Number of children: Not on file  . Years of education: Not on file  . Highest education level: Not on file  Occupational History  . Not on file  Social Needs  .  Financial resource strain: Not on file  . Food insecurity:    Worry: Not on file    Inability: Not on file  . Transportation needs:    Medical: Not on file    Non-medical: Not on file  Tobacco Use  . Smoking status: Current Some Day Smoker    Packs/day: 0.10    Years: 30.00    Pack years: 3.00    Types: Cigarettes  . Smokeless tobacco: Never Used  . Tobacco comment: 1-2 cig a day   Substance and Sexual Activity  . Alcohol use: No    Frequency: Never  . Drug use: No  . Sexual activity: Yes  Lifestyle  . Physical activity:    Days per week: Not on file    Minutes per session: Not on file  . Stress: Not on file  Relationships  . Social connections:    Talks on phone: Not on file    Gets together: Not on file    Attends religious service: Not on file    Active member of club or organization: Not on file    Attends meetings of clubs or organizations: Not on file    Relationship status: Not on file  . Intimate partner violence:    Fear of current or ex partner: Not on file    Emotionally abused: Not on file    Physically abused: Not on file    Forced sexual activity: Not on file  Other Topics Concern  . Not on file  Social History Narrative  . Not on file    Family History:     Family History  Problem Relation Age of Onset  . Cancer Father   . Diabetes Mellitus II Mother      ROS:  Please see the history of present illness.  General:no colds or fevers, no weight changes Skin:no rashes or ulcers HEENT:no blurred vision, no congestion CV:see HPI PUL:see HPI GI:no diarrhea constipation or melena, no indigestion, chronic abd pain GU:no hematuria, no dysuria MS:no joint pain, no claudication Neuro:no syncope, no lightheadedness Endo:no diabetes, no thyroid disease  All other ROS reviewed and negative.     Physical Exam/Data:   Vitals:   04/27/17 0800 04/27/17 0815 04/27/17 0830 04/27/17 0845  BP: 98/67 100/70 111/69 99/73  Pulse: 74 74 76 73  Resp: 18 16 19 16   Temp:      TempSrc:      SpO2: 99% 98% 100% 100%  Weight:      Height:       No intake or output data in the 24 hours ending 04/27/17 1017 Filed Weights   04/27/17 0703  Weight: 127 lb (57.6 kg)   Body mass index is 17.71 kg/m.  General:  Thin African American male, in no acute distress with residual chest pain HEENT: normal Lymph: no adenopathy Neck: no JVD Endocrine:  No thryomegaly Vascular: No carotid bruits; pedal pulses 2+ bilaterally  Cardiac:  normal S1, S2; RRR; ? murmur gallup rub or click -+ point tenderness just left of sternum Lungs:  clear to diminished auscultation bilaterally, no wheezing, rhonchi or rales  Abd: soft, +tenderness, no hepatomegaly  Ext: no edema Musculoskeletal:  No deformities, BUE and BLE strength normal and equal Skin: warm and dry  Neuro:  Alert and oriented X 3 MAE, follows commands, no focal abnormalities noted Psych:  Normal affect    Relevant CV Studies:  Echo 03/18/17  Study Conclusions  - Left ventricle: The cavity size was normal. There  was mild   concentric hypertrophy. Systolic function was normal. The   estimated ejection fraction was in the range of 55% to 60%. Wall   motion was normal; there were no regional wall motion    abnormalities. Features are consistent with a pseudonormal left   ventricular filling pattern, with concomitant abnormal relaxation   and increased filling pressure (grade 2 diastolic dysfunction). - Mitral valve: Systolic bowing without prolapse. - Pulmonary arteries: Systolic pressure was mildly increased. PA   peak pressure: 42 mm Hg (S).  Laboratory Data:  Chemistry Recent Labs  Lab 04/27/17 0740  NA 141  K 3.6  CL 98*  CO2 30  GLUCOSE 102*  BUN 25*  CREATININE 1.94*  CALCIUM 9.1  GFRNONAA 37*  GFRAA 42*  ANIONGAP 13    Recent Labs  Lab 04/27/17 0740  PROT 5.9*  ALBUMIN 2.8*  AST 20  ALT 12*  ALKPHOS 93  BILITOT 0.4   Hematology Recent Labs  Lab 04/27/17 0740  WBC 17.8*  RBC 2.90*  HGB 9.4*  HCT 29.8*  MCV 102.8*  MCH 32.4  MCHC 31.5  RDW 18.5*  PLT 240   Cardiac EnzymesNo results for input(s): TROPONINI in the last 168 hours.  Recent Labs  Lab 04/27/17 0746  TROPIPOC 0.00    BNPNo results for input(s): BNP, PROBNP in the last 168 hours.  DDimer No results for input(s): DDIMER in the last 168 hours.  Radiology/Studies:  Dg Chest 2 View  Result Date: 04/27/2017 CLINICAL DATA:  Chronic shortness of breath over the past 5 months. Acute onset chest pain 2 days ago. Current history of stage IV adenocarcinoma of the LEFT upper lobe for which the patient has completed radiation therapy and is currently receiving chemotherapy. Current smoker. EXAM: CHEST - 2 VIEW COMPARISON:  04/16/2017, 04/15/2017 and earlier, including CT chest 04/13/2017 and earlier. FINDINGS: AP ERECT and LATERAL images were obtained. Cardiac silhouette normal in size, unchanged. Prominent central pulmonary arteries bilaterally as noted previously. Hilar and mediastinal contours otherwise unremarkable. The previously identified mass extending from the LEFT hilum into the LEFT upper lobe is again noted, but is much less conspicuous on the x-ray. Emphysematous changes throughout both lungs  and fibrosis in the LOWER lobes, unchanged. Stable small BILATERAL pleural effusions. No new pulmonary parenchymal abnormalities. RIGHT jugular Port-A-Cath tip at the cavoatrial junction. Visualized bony thorax intact. IMPRESSION: 1. No new/acute cardiopulmonary disease. 2. LEFT upper lobe lung mass which is less conspicuous on the x-ray. 3. Stable small BILATERAL pleural effusions. 4.  Emphysema (ICD10-J43.9). 5. Stable fibrosis involving the LOWER lobes. Electronically Signed   By: Evangeline Dakin M.D.   On: 04/27/2017 08:01    Assessment and Plan:   1. Chest pain with neg troponin, EKG no acute changes.  No cardiac calcifications noted one way or another on CTA of chest. Pain is somewhat better with morphine but he did not like how it felt.   Has point chest tenderness, [facility] check one more troponin and if neg may be discharged. Dr. Curt Bears has seen.   2. Lung cancer with pain all over.   3. COPD, has not been able to stop smoking.  Currently no wheezes.     For questions or updates, please contact [place] Please consult [email or web address] for contact info under Cardiology/STEMI.   Signed, Cecilie Kicks, NP  04/27/2017 10:17 AM  I have seen and examined this patient with Cecilie Kicks.  Agree with above, note added to reflect my findings.  On  exam, RRR, no murmurs, lungs clear.    Patient presented to the hospital with chest pain.  Pain is worse with anxiety over his lung cancer diagnosis.  It does not appear that his pain is associated with any type of exertion.  On palpation of his chest left lateral to the xiphoid area, his pain is reproducible.  He had a troponin which was drawn and is negative thus far.  Would check one more troponin and if it remains negative, would be okay with discharge with close follow-up with primary care.  Trevia Nop M. Delbert Vu MD 04/27/2017 11:16 AM

## 2017-04-27 NOTE — ED Notes (Signed)
Patient transported to X-ray 

## 2017-04-27 NOTE — ED Triage Notes (Signed)
Pt brought to ED by [facility] from [facility] for c/o left side cp 8/10 pt is a Lung CA  With a SPO2 70% on RA 100 % on 3L [place]. 5mg  Albuterol, 324 mg ASA given by EMS pta to ED pt refuses to have Nitro or Morphine given by EMS.  BP 98/50. R 18, HR 75 by EMS.

## 2017-04-27 NOTE — ED Triage Notes (Signed)
Pt BIB [facility] for substernal chest pain that has been going on for two days. Pt has stage 4 lung cancer and states he is no more short of breath than normal.324 ASA and 1 nitro given enroute

## 2017-04-27 NOTE — ED Notes (Signed)
Patient refused to sign discharge paperwork. Patient unset because he was placed into hallway stating he is "calling his lawyer about being placed in hallway".

## 2017-04-28 NOTE — ED Provider Notes (Signed)
[facility] EMERGENCY DEPARTMENT Provider Note   CSN: [id number] Arrival date & time: 04/27/17  2025     History   Chief Complaint Chief Complaint  Patient presents with  . Chest Pain    HPI [name] is a 57 y.o. male.  HPI Patient reports he still had chest pain when he was discharged from emergency department earlier in the day.  He reports he just felt like he was not ready to be discharged.  Pain has not changed in severity or quality.  No worsening of chronic shortness of breath. Past Medical History:  Diagnosis Date  . Abdominal pain 06/04/2016  . Adenocarcinoma of left lung, stage 4 ([place]) 05/02/2016  . Alcohol abuse   . Bronchitis due to tobacco use ([place])   . Cancer ([place])    Lung  . COPD (chronic obstructive pulmonary disease) ([place])   . Dehydration 06/04/2016  . Encounter for antineoplastic chemotherapy 05/02/2016  . Gastritis   . Goals of care, counseling/discussion 05/02/2016  . Hematemesis   . HTN (hypertension) 10/30/2016  . Recurrent upper respiratory infection (URI)   . Seizures ([place]) 05/2011   new onset  . Shortness of breath     Patient Active Problem List   Diagnosis Date Noted  . Chronic respiratory failure with hypoxia ([place]) 04/01/2017  . Leucocytosis 03/16/2017  . CKD (chronic kidney disease), stage III ([place]) 03/12/2017  . Malnutrition of moderate degree 03/10/2017  . COPD with acute exacerbation ([place]) 03/09/2017  . Anemia 02/07/2017  . Severe malnutrition ([place]) 01/03/2017  . DOE (dyspnea on exertion) 12/30/2016  . Nausea   . Hypervolemia   . HCAP (healthcare-associated pneumonia) 12/19/2016  . Dyspnea and respiratory abnormality 12/19/2016  . Erosive gastropathy 12/18/2016  . Gastritis 12/18/2016  . Hematemesis 12/16/2016  . Intractable nausea and vomiting 12/16/2016  . HTN (hypertension) 10/30/2016  . Chronic obstructive pulmonary disease ([place]) 09/02/2016  . COPD (chronic obstructive pulmonary disease) ([place])  08/19/2016  . Dehydration 06/04/2016  . Abdominal pain 06/04/2016  . 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 Adenocarcinoma of left lung, stage 4 ([place]) 05/02/2016  . Goals of care, counseling/discussion 05/02/2016  . Encounter for antineoplastic chemotherapy 05/02/2016  . Tobacco abuse 05/30/2011  . Alcohol abuse 05/30/2011  . Seizure ([place]) 05/28/2011    Past Surgical History:  Procedure Laterality Date  . ESOPHAGOGASTRODUODENOSCOPY (EGD) WITH PROPOFOL N/A 12/17/2016   Procedure: ESOPHAGOGASTRODUODENOSCOPY (EGD) WITH PROPOFOL;  Surgeon: Ladene Artist, MD;  Location: [facility] ENDOSCOPY;  Service: Endoscopy;  Laterality: N/A;  . IR FLUORO GUIDE PORT INSERTION RIGHT  05/13/2016  . IR US GUIDE VASC ACCESS RIGHT  05/13/2016  . NO PAST SURGERIES          Home Medications    Prior to Admission medications   Medication Sig Start Date End Date Taking? Authorizing Provider  acetaminophen (TYLENOL) 500 MG tablet Take 500 mg by mouth 2 (two) times daily as needed for mild pain or headache.   Yes [provider]  albuterol (PROVENTIL) (2.5 MG/3ML) 0.083% nebulizer solution 1 neb every 4-6 hours as needed for wheezing and shortness of breath Patient taking differently: Take 2.5 mg by nebulization every 4 (four) hours as needed for wheezing or shortness of breath.  04/01/17  Yes Parrett, Tammy S, NP  amLODipine (NORVASC) 10 MG tablet Take 10 mg by mouth daily.   Yes [provider]  Chlorphen-Phenyleph-ASA (ALKA-SELTZER PLUS COLD PO) Take 2 tablets by mouth at bedtime as needed (COUGH).  Yes [provider]  dexamethasone (DECADRON) 4 MG tablet Take 4 mg by mouth See admin instructions. Take 1 tablet twice daily the day before, the day of and the day after chemotherapy every 3 weeks.    Yes [provider]  escitalopram (LEXAPRO) 5 MG tablet Take 1 tablet (5 mg total) by mouth at bedtime. 04/17/17  Yes Rosita Fire, MD  feeding supplement, ENSURE ENLIVE,  (ENSURE ENLIVE) LIQD Take 237 mLs by mouth 3 (three) times daily between meals. 03/19/17  Yes Shelly Coss, MD  fluconazole (DIFLUCAN) 100 MG tablet Take 1 tablet (100 mg total) by mouth daily. Patient taking differently: Take 100 mg by mouth daily. Continuous 03/20/17  Yes Adhikari, Tamsen Meek, MD  Fluticasone-Umeclidin-Vilant (TRELEGY ELLIPTA) 100-62.5-25 MCG/INH AEPB Inhale 1 puff into the lungs daily. 04/01/17  Yes Parrett, Tammy S, NP  furosemide (LASIX) 40 MG tablet Take 1 tablet (40 mg total) by mouth daily. 04/17/17  Yes Rosita Fire, MD  guaiFENesin (MUCINEX) 600 MG 12 hr tablet Take 1 tablet (600 mg total) by mouth 2 (two) times daily. 03/19/17  Yes Shelly Coss, MD  guaiFENesin-dextromethorphan (ROBITUSSIN DM) 100-10 MG/5ML syrup Take 5 mLs by mouth every 4 (four) hours as needed for cough. 04/17/17  Yes Rosita Fire, MD  lidocaine-prilocaine (EMLA) cream Apply 1 application topically as needed (for port access).    Yes [provider]  loratadine (CLARITIN) 10 MG tablet Take 10 mg by mouth daily.   Yes [provider]  metoprolol tartrate (LOPRESSOR) 25 MG tablet Take 25 mg by mouth 2 (two) times daily.   Yes [provider]  ondansetron (ZOFRAN-ODT) 4 MG disintegrating tablet Take 4 mg by mouth every 8 (eight) hours as needed for nausea or vomiting.   Yes [provider]  Oxycodone HCl 10 MG TABS Take 1 tablet (10 mg total) by mouth every 4 (four) hours as needed (or shortness of breath). Patient taking differently: Take 10 mg by mouth every 4 (four) hours as needed (for severe pain or shortness of breath).  04/08/17  Yes Curt Bears, MD  pantoprazole (PROTONIX) 40 MG tablet Take 1 tablet (40 mg total) by mouth 2 (two) times daily. 12/18/16  Yes Eugenie Filler, MD  polyethylene glycol [facility] / Floria Raveling) packet Take 17 g by mouth daily as needed for mild constipation. 03/19/17  Yes Adhikari, Tamsen Meek, MD  senna-docusate (SENNA-PLUS)  8.6-50 MG tablet Take 1 tablet by mouth at bedtime.   Yes [provider]  Tiotropium Bromide-Olodaterol (STIOLTO RESPIMAT) 2.5-2.5 MCG/ACT AERS Inhale 2 puffs into the lungs daily.   Yes [provider]  Ipratropium-Albuterol (COMBIVENT) 20-100 MCG/ACT AERS respimat Inhale 1 puff into the lungs every 6 (six) hours. Patient not taking: Reported on 04/07/2017 07/22/16   Curt Bears, MD  predniSONE (DELTASONE) 10 MG tablet Take 1 tablet (10 mg total) by mouth daily. Take 3 tablets for 2 days, 2 tablets for 2 days, 1 tablet for 2 days and then stop. Patient not taking: Reported on 04/27/2017 04/17/17   Rosita Fire, MD    Family History Family History  Problem Relation Age of Onset  . Cancer Father   . Diabetes Mellitus II Mother     Social History Social History   Tobacco Use  . Smoking status: Current Some Day Smoker    Packs/day: 0.10    Years: 30.00    Pack years: 3.00    Types: Cigarettes  . Smokeless tobacco: Never Used  . Tobacco  comment: 1-2 cig a day   Substance Use Topics  . Alcohol use: No    Frequency: Never  . Drug use: No     Allergies   Patient has no known allergies.   Review of Systems Review of Systems 10 Systems reviewed and are negative for acute change except as noted in the HPI.   Physical Exam Updated Vital Signs BP 90/63   Pulse 67   Resp 19   Ht 5\' 11"  (1.803 m)   Wt 57.6 kg (127 lb)   SpO2 99%   BMI 17.71 kg/m   Physical Exam  Constitutional: He is oriented to person, place, and time.  Patient is alert and in no acute distress.  No respiratory distress.  Mental status clear.  HENT:  Head: Normocephalic and atraumatic.  Eyes: EOM are normal.  Cardiovascular: Normal rate, regular rhythm, normal heart sounds and intact distal pulses.  Pulmonary/Chest: Effort normal.  Breath sounds present bilaterally.  No gross wheeze rhonchi or rale.  Breath sounds are soft in the back and lower lung fields  Abdominal: Soft.  He exhibits no distension. There is no tenderness. There is no guarding.  Musculoskeletal:  1-2+ pitting edema bilateral ankles.  Calves are soft and nontender.  Neurological: He is alert and oriented to person, place, and time. He exhibits normal muscle tone. Coordination normal.  Skin: Skin is warm and dry.  Psychiatric: He has a normal mood and affect.     ED Treatments / Results  Labs (all labs ordered are listed, but only abnormal results are displayed) Labs Reviewed  COMPREHENSIVE METABOLIC PANEL - Abnormal; Notable for the following components:      Result Value   Glucose, Bld 131 (*)    Creatinine, Ser 1.84 (*)    Calcium 8.8 (*)    Total Protein 6.2 (*)    Albumin 2.8 (*)    ALT 11 (*)    GFR calc non Af Amer 39 (*)    GFR calc Af Amer 45 (*)    All other components within normal limits  CBC WITH DIFFERENTIAL/PLATELET - Abnormal; Notable for the following components:   WBC 19.2 (*)    RBC 2.86 (*)    Hemoglobin 9.3 (*)    HCT 29.8 (*)    MCV 104.2 (*)    RDW 18.5 (*)    Neutro Abs 15.5 (*)    Monocytes Absolute 1.8 (*)    All other components within normal limits  LIPASE, BLOOD  I-STAT TROPONIN, ED    EKG EKG Interpretation  Date/Time:  Sunday April 27 2017 20:34:16 EDT Ventricular Rate:  78 PR Interval:    QRS Duration: 84 QT Interval:  388 QTC Calculation: 442 R Axis:   102 Text Interpretation:  Sinus rhythm Confirmed by Charlesetta Shanks [id number]) on 04/28/2017 12:25:45 AM   Radiology Dg Chest 2 View  Result Date: 04/27/2017 CLINICAL DATA:  Chronic shortness of breath over the past 5 months. Acute onset chest pain 2 days ago. Current history of stage IV adenocarcinoma of the LEFT upper lobe for which the patient has completed radiation therapy and is currently receiving chemotherapy. Current smoker. EXAM: CHEST - 2 VIEW COMPARISON:  04/16/2017, 04/15/2017 and earlier, including CT chest 04/13/2017 and earlier. FINDINGS: AP ERECT and LATERAL images were  obtained. Cardiac silhouette normal in size, unchanged. Prominent central pulmonary arteries bilaterally as noted previously. Hilar and mediastinal contours otherwise unremarkable. The previously identified mass extending from the LEFT hilum into the LEFT upper  lobe is again noted, but is much less conspicuous on the x-ray. Emphysematous changes throughout both lungs and fibrosis in the LOWER lobes, unchanged. Stable small BILATERAL pleural effusions. No new pulmonary parenchymal abnormalities. RIGHT jugular Port-A-Cath tip at the cavoatrial junction. Visualized bony thorax intact. IMPRESSION: 1. No new/acute cardiopulmonary disease. 2. LEFT upper lobe lung mass which is less conspicuous on the x-ray. 3. Stable small BILATERAL pleural effusions. 4.  Emphysema (ICD10-J43.9). 5. Stable fibrosis involving the LOWER lobes. Electronically Signed   By: Evangeline Dakin M.D.   On: 04/27/2017 08:01    Procedures Procedures (including critical care time)  Medications Ordered in ED Medications  oxyCODONE-acetaminophen (PERCOCET/ROXICET) 5-325 MG per tablet 2 tablet (has no administration in time range)     Initial Impression / Assessment and Plan / ED Course  I have reviewed the triage vital signs and the nursing notes.  Pertinent labs & imaging results that were available during my care of the patient were reviewed by me and considered in my medical decision making (see chart for details).     Final Clinical Impressions(s) / ED Diagnoses   Final diagnoses:  Chest pain, unspecified type  Malignant neoplasm of left lung, unspecified part of lung [facility])  Patient presents after evaluation earlier in the day with ongoing chest pain.  Repeat troponin shows no change.  EKG has early repolarization pattern consistent with previous EKGs.  At this time, I do not suspect this is acutely ischemic.  Patient did have cardiology consultation earlier in the day.  He has ruled out for MI based on earlier troponins and  repeat troponins upon second presentation.  Patient does have underlying malignancy.  He does not think the pain is due to his malignancy.  Review of his EMR indicates that oncology has found the patient to be doing well in terms of treatment.  She has had CT scans including CT PE study x2 in February and CT chest with ultrasound-guided thoracentesis at the end of March.  Chest x-ray obtained earlier today does not show any new or acute cardiopulmonary disease.  Stable findings.  In the emergency department, patient does appear clinically well.  His mental status is clear.  He is not appear to be in distress.  Heart rate is sinus without tachycardia.  At this time we do feel patient is stable for discharge.  He is advised to consult contact his oncologist tomorrow morning to discuss his evaluation in the emergency department and have recheck ASAP.  Return precautions reviewed.  ED Discharge Orders    None       Charlesetta Shanks, MD 04/28/17 0030

## 2017-04-29 ENCOUNTER — Other Ambulatory Visit: Payer: Self-pay

## 2017-04-29 DIAGNOSIS — C3492 Malignant neoplasm of unspecified part of left bronchus or lung: Secondary | ICD-10-CM

## 2017-04-29 MED ORDER — OXYCODONE HCL 10 MG PO TABS: 10.0000 mg | ORAL_TABLET | ORAL | 0 refills | Status: DC | PRN

## 2017-04-29 NOTE — Telephone Encounter (Signed)
Returned call regarding script for pain medication refill. Informed facility it was ready for pick up.  Cyndia Bent RN

## 2017-05-01 NOTE — Telephone Encounter (Signed)
Fax from [facility], un to reach pt by phone, letter sent to pt offering MSW.

## 2017-05-06 ENCOUNTER — Telehealth: Payer: Self-pay

## 2017-05-06 ENCOUNTER — Encounter: Payer: Self-pay | Admitting: Internal Medicine

## 2017-05-06 ENCOUNTER — Inpatient Hospital Stay: Payer: Medicaid Other

## 2017-05-06 ENCOUNTER — Telehealth: Payer: Self-pay | Admitting: Internal Medicine

## 2017-05-06 ENCOUNTER — Inpatient Hospital Stay (HOSPITAL_BASED_OUTPATIENT_CLINIC_OR_DEPARTMENT_OTHER): Payer: Medicaid Other | Admitting: Internal Medicine

## 2017-05-06 ENCOUNTER — Inpatient Hospital Stay: Payer: Medicaid Other | Attending: Internal Medicine

## 2017-05-06 VITALS — BP 118/61 | HR 70 | Temp 97.8°F | Resp 18 | Ht 71.0 in | Wt 120.7 lb

## 2017-05-06 DIAGNOSIS — Z9981 Dependence on supplemental oxygen: Secondary | ICD-10-CM | POA: Diagnosis not present

## 2017-05-06 DIAGNOSIS — C7951 Secondary malignant neoplasm of bone: Secondary | ICD-10-CM | POA: Insufficient documentation

## 2017-05-06 DIAGNOSIS — Z5111 Encounter for antineoplastic chemotherapy: Secondary | ICD-10-CM

## 2017-05-06 DIAGNOSIS — I10 Essential (primary) hypertension: Secondary | ICD-10-CM | POA: Diagnosis not present

## 2017-05-06 DIAGNOSIS — E860 Dehydration: Secondary | ICD-10-CM | POA: Insufficient documentation

## 2017-05-06 DIAGNOSIS — C3492 Malignant neoplasm of unspecified part of left bronchus or lung: Secondary | ICD-10-CM

## 2017-05-06 LAB — CBC WITH DIFFERENTIAL/PLATELET
BASOS PCT: 0 %
Basophils Absolute: 0 10*3/uL (ref 0.0–0.1)
EOS ABS: 0 10*3/uL (ref 0.0–0.5)
EOS PCT: 0 %
HCT: 28.3 % — ABNORMAL LOW (ref 38.4–49.9)
Hemoglobin: 9.5 g/dL — ABNORMAL LOW (ref 13.0–17.1)
Lymphocytes Relative: 2 %
Lymphs Abs: 0.2 10*3/uL — ABNORMAL LOW (ref 0.9–3.3)
MCH: 33.9 pg — ABNORMAL HIGH (ref 27.2–33.4)
MCHC: 33.7 g/dL (ref 32.0–36.0)
MCV: 100.6 fL — ABNORMAL HIGH (ref 79.3–98.0)
MONO ABS: 0.8 10*3/uL (ref 0.1–0.9)
MONOS PCT: 9 %
NEUTROS PCT: 89 %
Neutro Abs: 7.7 10*3/uL — ABNORMAL HIGH (ref 1.5–6.5)
PLATELETS: 208 10*3/uL (ref 140–400)
RBC: 2.81 MIL/uL — ABNORMAL LOW (ref 4.20–5.82)
RDW: 18.6 % — AB (ref 11.0–14.6)
WBC: 8.7 10*3/uL (ref 4.0–10.3)

## 2017-05-06 LAB — COMPREHENSIVE METABOLIC PANEL
ALK PHOS: 115 U/L (ref 40–150)
AST: 13 U/L (ref 5–34)
Albumin: 2.8 g/dL — ABNORMAL LOW (ref 3.5–5.0)
Anion gap: 12 — ABNORMAL HIGH (ref 3–11)
BUN: 23 mg/dL (ref 7–26)
CALCIUM: 10 mg/dL (ref 8.4–10.4)
CO2: 29 mmol/L (ref 22–29)
CREATININE: 2.47 mg/dL — AB (ref 0.70–1.30)
Chloride: 97 mmol/L — ABNORMAL LOW (ref 98–109)
GFR calc non Af Amer: 27 mL/min — ABNORMAL LOW (ref >60)
GFR, EST AFRICAN AMERICAN: 32 mL/min — AB (ref >60)
GLUCOSE: 144 mg/dL — AB (ref 70–140)
Potassium: 4.6 mmol/L (ref 3.5–5.1)
SODIUM: 138 mmol/L (ref 136–145)
Total Bilirubin: 0.3 mg/dL (ref 0.2–1.2)
Total Protein: 7.2 g/dL (ref 6.4–8.3)

## 2017-05-06 MED ORDER — SODIUM CHLORIDE 0.9% FLUSH
10.0000 mL | INTRAVENOUS | Status: DC | PRN
  Administered 2017-05-06: 10 mL
  Filled 2017-05-06: qty 10

## 2017-05-06 MED ORDER — HEPARIN SOD (PORK) LOCK FLUSH 100 UNIT/ML IV SOLN
500.0000 [IU] | Freq: Once | INTRAVENOUS | Status: AC | PRN
  Administered 2017-05-06: 500 [IU]
  Filled 2017-05-06: qty 5

## 2017-05-06 MED ORDER — SODIUM CHLORIDE 0.9 % IV SOLN
INTRAVENOUS | Status: DC
  Administered 2017-05-06: 15:00:00 via INTRAVENOUS

## 2017-05-06 NOTE — Telephone Encounter (Signed)
Scheduled appt per 4/16 los - gave patient AVS and calender per los

## 2017-05-06 NOTE — Telephone Encounter (Signed)
err

## 2017-05-06 NOTE — Progress Notes (Signed)
[place] [phone]   [phone]  OFFICE PROGRESS NOTE  Patient, No Pcp Per No address on file  DIAGNOSIS: Stage IV (T3, N3, M1c) non-small cell lung cancer, moderately to poorly differentiated adenocarcinoma diagnosed in February 2018 with negative EGFR, ALK, ROS1 and BRAF mutations, with PD L1 expression 15-20 %.  PRIOR THERAPY:  1) Palliative radiotherapy to the T12 and L3 completed on 05/27/2016 under the care of Dr. Tammi Klippel. 2) Systemic chemotherapy with carboplatin for AUC of 5, Alimta 500 MG/M2 and Avastin 15 MG/KG every 3 weeks. Status post 6 cycles with partial response.  CURRENT THERAPY: Maintenance treatment with single agent Alimta 500 MG/M2 every 3 weeks. First dose 10/09/2016.status post 6 cycle.  INTERVAL HISTORY: [name] 57 y.o. male returns to the clinic today for follow-up visit.  The patient is feeling fine today with no specific complaints.  He was admitted to [facility] few weeks ago with questionable pneumonia and shortness of breath.  He has ultrasound guided thoracentesis of the right pleural effusion during his hospitalization.  He is feeling much better today with no specific complaints.  He missed the last cycle of chemotherapy 3 weeks ago.  He is here today for reevaluation and resuming his treatment.  He denied having any chest pain but has shortness of breath at baseline increased with exertion and he is currently on home oxygen.  He denied having any fever or chills.  He has no headache or visual changes.  MEDICAL HISTORY: Past Medical History:  Diagnosis Date  . Abdominal pain 06/04/2016  . Adenocarcinoma of left lung, stage 4 ([place]) 05/02/2016  . Alcohol abuse   . Bronchitis due to tobacco use ([place])   . Cancer ([place])    Lung  . COPD (chronic obstructive pulmonary disease) ([place])   . Dehydration 06/04/2016  . Encounter for antineoplastic chemotherapy 05/02/2016  . Gastritis   . Goals of care,  counseling/discussion 05/02/2016  . Hematemesis   . HTN (hypertension) 10/30/2016  . Recurrent upper respiratory infection (URI)   . Seizures ([place]) 05/2011   new onset  . Shortness of breath     ALLERGIES:  has No Known Allergies.  MEDICATIONS:  Current Outpatient Medications  Medication Sig Dispense Refill  . acetaminophen (TYLENOL) 500 MG tablet Take 500 mg by mouth 2 (two) times daily as needed for mild pain or headache.    . albuterol (PROVENTIL) (2.5 MG/3ML) 0.083% nebulizer solution 1 neb every 4-6 hours as needed for wheezing and shortness of breath (Patient taking differently: Take 2.5 mg by nebulization every 4 (four) hours as needed for wheezing or shortness of breath. ) 360 mL 12  . amLODipine (NORVASC) 10 MG tablet Take 10 mg by mouth daily.    . Chlorphen-Phenyleph-ASA (ALKA-SELTZER PLUS COLD PO) Take 2 tablets by mouth at bedtime as needed (COUGH).    Marland Kitchen dexamethasone (DECADRON) 4 MG tablet Take 4 mg by mouth See admin instructions. Take 1 tablet twice daily the day before, the day of and the day after chemotherapy every 3 weeks.     Marland Kitchen escitalopram (LEXAPRO) 5 MG tablet Take 1 tablet (5 mg total) by mouth at bedtime. 30 tablet 0  . feeding supplement, ENSURE ENLIVE, (ENSURE ENLIVE) LIQD Take 237 mLs by mouth 3 (three) times daily between meals. 237 mL 2  . fluconazole (DIFLUCAN) 100 MG tablet Take 1 tablet (100 mg total) by mouth daily. (Patient taking differently: Take 100 mg by mouth  daily. Continuous) 4 tablet 0  . Fluticasone-Umeclidin-Vilant (TRELEGY ELLIPTA) 100-62.5-25 MCG/INH AEPB Inhale 1 puff into the lungs daily. 1 each 0  . furosemide (LASIX) 40 MG tablet Take 1 tablet (40 mg total) by mouth daily. 30 tablet 0  . guaiFENesin (MUCINEX) 600 MG 12 hr tablet Take 1 tablet (600 mg total) by mouth 2 (two) times daily. 14 tablet 0  . guaiFENesin-dextromethorphan (ROBITUSSIN DM) 100-10 MG/5ML syrup Take 5 mLs by mouth every 4 (four) hours as needed for cough. 118 mL 0  .  Ipratropium-Albuterol (COMBIVENT) 20-100 MCG/ACT AERS respimat Inhale 1 puff into the lungs every 6 (six) hours. (Patient not taking: Reported on 04/07/2017) 1 Inhaler 0  . lidocaine-prilocaine (EMLA) cream Apply 1 application topically as needed (for port access).     Marland Kitchen loratadine (CLARITIN) 10 MG tablet Take 10 mg by mouth daily.    . metoprolol tartrate (LOPRESSOR) 25 MG tablet Take 25 mg by mouth 2 (two) times daily.    . ondansetron (ZOFRAN-ODT) 4 MG disintegrating tablet Take 4 mg by mouth every 8 (eight) hours as needed for nausea or vomiting.    . Oxycodone HCl 10 MG TABS Take 1 tablet (10 mg total) by mouth every 4 (four) hours as needed (or shortness of breath). 30 tablet 0  . pantoprazole (PROTONIX) 40 MG tablet Take 1 tablet (40 mg total) by mouth 2 (two) times daily. 60 tablet 1  . polyethylene glycol (MIRALAX / GLYCOLAX) packet Take 17 g by mouth daily as needed for mild constipation. 14 each 0  . predniSONE (DELTASONE) 10 MG tablet Take 1 tablet (10 mg total) by mouth daily. Take 3 tablets for 2 days, 2 tablets for 2 days, 1 tablet for 2 days and then stop. (Patient not taking: Reported on 04/27/2017) 12 tablet 0  . senna-docusate (SENNA-PLUS) 8.6-50 MG tablet Take 1 tablet by mouth at bedtime.    . Tiotropium Bromide-Olodaterol (STIOLTO RESPIMAT) 2.5-2.5 MCG/ACT AERS Inhale 2 puffs into the lungs daily.     No current facility-administered medications for this visit.     SURGICAL HISTORY:  Past Surgical History:  Procedure Laterality Date  . ESOPHAGOGASTRODUODENOSCOPY (EGD) WITH PROPOFOL N/A 12/17/2016   Procedure: ESOPHAGOGASTRODUODENOSCOPY (EGD) WITH PROPOFOL;  Surgeon: Ladene Artist, MD;  Location: [facility];  Service: Endoscopy;  Laterality: N/A;  . IR FLUORO GUIDE PORT INSERTION RIGHT  05/13/2016  . IR US GUIDE VASC ACCESS RIGHT  05/13/2016  . NO PAST SURGERIES      REVIEW OF SYSTEMS:  A comprehensive review of systems was negative except for: Constitutional: positive  for fatigue Respiratory: positive for dyspnea on exertion   PHYSICAL EXAMINATION: General appearance: alert, cooperative, fatigued and no distress Head: Normocephalic, without obvious abnormality, atraumatic Neck: no adenopathy, no JVD, supple, symmetrical, trachea midline and thyroid not enlarged, symmetric, no tenderness/mass/nodules Lymph nodes: Cervical, supraclavicular, and axillary nodes normal. Resp: clear to auscultation bilaterally Back: symmetric, no curvature. ROM normal. No CVA tenderness. Cardio: regular rate and rhythm, S1, S2 normal, no murmur, click, rub or gallop GI: soft, non-tender; bowel sounds normal; no masses,  no organomegaly Extremities: extremities normal, atraumatic, no cyanosis or edema  ECOG PERFORMANCE STATUS: 1 - Symptomatic but completely ambulatory  Blood pressure 118/61, pulse 70, temperature 97.8 F (36.6 C), temperature source Oral, resp. rate 18, height 5' 11"  (1.803 m), weight 120 lb 11.2 oz (54.7 kg), SpO2 92 %.  LABORATORY DATA: Lab Results  Component Value Date   WBC 8.7 05/06/2017   HGB  9.5 (L) 05/06/2017   HCT 28.3 (L) 05/06/2017   MCV 100.6 (H) 05/06/2017   PLT 208 05/06/2017      Chemistry      Component Value Date/Time   NA 138 05/06/2017 1057   NA 135 (L) 01/23/2017 1028   K 4.6 05/06/2017 1057   K 4.6 01/23/2017 1028   CL 97 (L) 05/06/2017 1057   CO2 29 05/06/2017 1057   CO2 26 01/23/2017 1028   BUN 23 05/06/2017 1057   BUN 23.0 01/23/2017 1028   CREATININE 2.47 (H) 05/06/2017 1057   CREATININE 1.6 (H) 01/23/2017 1028      Component Value Date/Time   CALCIUM 10.0 05/06/2017 1057   CALCIUM 9.6 01/23/2017 1028   ALKPHOS 115 05/06/2017 1057   ALKPHOS 129 01/23/2017 1028   AST 13 05/06/2017 1057   AST 21 01/23/2017 1028   ALT <6 05/06/2017 1057   ALT 17 01/23/2017 1028   BILITOT 0.3 05/06/2017 1057   BILITOT 0.98 01/23/2017 1028       RADIOGRAPHIC STUDIES: Dg Chest 1 View  Result Date: 04/15/2017 CLINICAL DATA:   Followup right thoracentesis. EXAM: CHEST  1 VIEW COMPARISON:  CT 04/13/2017. FINDINGS: Background pattern of emphysema. Mild cardiomegaly. Power port in place from the right with tip in the right atrium. No residual pleural fluid visible on the right. No pneumothorax. Small left effusion with left base volume loss as seen previously. IMPRESSION: No residual pleural fluid visible on the right.  No pneumothorax. Small left effusion with left base volume loss. Emphysema. Electronically Signed   By: Nelson Chimes M.D.   On: 04/15/2017 10:48   Dg Chest 2 View  Result Date: 04/27/2017 CLINICAL DATA:  Chronic shortness of breath over the past 5 months. Acute onset chest pain 2 days ago. Current history of stage IV adenocarcinoma of the LEFT upper lobe for which the patient has completed radiation therapy and is currently receiving chemotherapy. Current smoker. EXAM: CHEST - 2 VIEW COMPARISON:  04/16/2017, 04/15/2017 and earlier, including CT chest 04/13/2017 and earlier. FINDINGS: AP ERECT and LATERAL images were obtained. Cardiac silhouette normal in size, unchanged. Prominent central pulmonary arteries bilaterally as noted previously. Hilar and mediastinal contours otherwise unremarkable. The previously identified mass extending from the LEFT hilum into the LEFT upper lobe is again noted, but is much less conspicuous on the x-ray. Emphysematous changes throughout both lungs and fibrosis in the LOWER lobes, unchanged. Stable small BILATERAL pleural effusions. No new pulmonary parenchymal abnormalities. RIGHT jugular Port-A-Cath tip at the cavoatrial junction. Visualized bony thorax intact. IMPRESSION: 1. No new/acute cardiopulmonary disease. 2. LEFT upper lobe lung mass which is less conspicuous on the x-ray. 3. Stable small BILATERAL pleural effusions. 4.  Emphysema (ICD10-J43.9). 5. Stable fibrosis involving the LOWER lobes. Electronically Signed   By: Evangeline Dakin M.D.   On: 04/27/2017 08:01   Dg Chest 2  View  Result Date: 04/10/2017 CLINICAL DATA:  Cough and  shortness of breath EXAM: CHEST - 2 VIEW COMPARISON:  Chest radiograph 03/17/2017 FINDINGS: Right anterior chest wall Port-A-Cath is present with tip projecting over the right atrium. Stable cardiac and mediastinal contours. Interval development of right-greater-than-left mid lower lung heterogeneous opacities. Small bilateral pleural effusions. IMPRESSION: New right mid and lower lung opacities with minimal left lung base opacities concerning for pneumonia. Small bilateral pleural effusions. Electronically Signed   By: Lovey Newcomer M.D.   On: 04/10/2017 10:33   Dg Lumbar Spine Complete  Result Date: 04/10/2017 CLINICAL DATA:  Low back pain EXAM: LUMBAR SPINE - COMPLETE 4+ VIEW COMPARISON:  None. FINDINGS: No fracture. No subluxation. Mild loss of disc height is seen at L3-4 and L4-5. There is minimal facet degeneration at lower lumbar levels. IMPRESSION: Minimal degenerative changes.  No acute findings. Electronically Signed   By: Misty Stanley M.D.   On: 04/10/2017 10:31   Ct Head Wo Contrast  Result Date: 04/14/2017 CLINICAL DATA:  [place] getting out of bed. History of metastatic lung cancer. EXAM: CT HEAD WITHOUT CONTRAST TECHNIQUE: Contiguous axial images were obtained from the base of the skull through the vertex without intravenous contrast. COMPARISON:  05/28/2011 FINDINGS: Brain: No evidence of acute infarction, hemorrhage, extra-axial collection, ventriculomegaly, or mass effect. Generalized cerebral atrophy. Periventricular white matter low attenuation likely secondary to microangiopathy. Vascular: Cerebrovascular atherosclerotic calcifications are noted. Skull: Negative for fracture or focal lesion. Sinuses/Orbits: Visualized portions of the orbits are unremarkable. Mastoid sinuses are clear. Bilateral mucosal thickening in the maxillary sinuses. Other: None. IMPRESSION: No acute intracranial pathology. Electronically Signed   By: Kathreen Devoid   On: 04/14/2017 09:07   Ct Chest Wo Contrast  Result Date: 04/13/2017 CLINICAL DATA:  57 year old male with lung cancer. Shortness of breath. Possible pneumonia on recent chest radiographs 04/10/2017. EXAM: CT CHEST WITHOUT CONTRAST TECHNIQUE: Multidetector CT imaging of the chest was performed following the standard protocol without IV contrast. COMPARISON:  Chest radiographs 04/10/2017 and earlier. Chest CTA 03/13/2017, and earlier. FINDINGS: Cardiovascular: Stable cardiac size. No pericardial effusion. Right chest porta cath remains in place. Vascular patency is not evaluated in the absence of IV contrast. Mediastinum/Nodes: Stable appearance of the mediastinum since February in the absence of IV contrast today. Lungs/Pleura: Moderate size layering right pleural effusion has progressed since February with simple fluid density, and layering up to a depth of 5-6 centimeters in the right hemithorax. A small or trace left pleural effusion is new since February. Small volume retained secretions in the lower trachea. Otherwise the major airways are stable. Chronic emphysema. Spiculated opacity tracking along the left major fissure to the left hilum appears stable since February measuring 15 x 31 millimeters at a comparable level (15 x 29 millimeters previously). Aside from increased compressive atelectasis in the right lung, ventilation is stable since 03/13/2017. Upper Abdomen: Stable visible upper abdominal viscera with persistent fluid along the celiac axis and gastrohepatic ligament. Musculoskeletal: Heterogeneous sclerosis in the lower thoracic vertebral bodies is stable. No new osseous abnormality identified. IMPRESSION: 1. Moderate layering right pleural effusion has increased since February. Increased compressive right lung atelectasis. A trace left pleural effusion has also increased. 2. Otherwise stable ventilation and CT appearance of the lungs since February, including Emphysema (ICD10-J43.9) and  spiculated opacity along the left major fissure compatible with lung carcinoma. 3. Stable nonspecific fluid collection along the gastrohepatic ligament and celiac axis in the upper abdomen. Electronically Signed   By: Genevie Ann M.D.   On: 04/13/2017 23:05   Dg Chest Port 1 View  Result Date: 04/16/2017 CLINICAL DATA:  Cough and shortness of breath; history of stage IV adenocarcinoma of the left lung, current smoker, COPD. EXAM: PORTABLE CHEST 1 VIEW COMPARISON:  Portable chest x-ray of April 15, 2017 and chest CT scan of April 13, 2017. FINDINGS: The lungs remain hyperinflated. There is increased density at the left lung base. There is no right pleural effusion or pneumothorax. The heart and pulmonary vascularity are normal. The power port catheter tip projects over the distal third of the SVC. There  is decreased fluid in the major fissure. IMPRESSION: COPD. No recurrent right pleural effusion. Trace left pleural effusion versus pleural thickening. No pneumothorax. The known left lower lobe pulmonary mass is not clearly evident. Electronically Signed   By: David  Martinique M.D.   On: 04/16/2017 10:18   US Thoracentesis Asp Pleural Space W/img Guide  Result Date: 04/15/2017 INDICATION: Patient with history of metastatic left lung cancer, dyspnea, right pleural effusion. Request made for diagnostic and therapeutic right thoracentesis. EXAM: ULTRASOUND GUIDED DIAGNOSTIC AND THERAPEUTIC RIGHT THORACENTESIS MEDICATIONS: None COMPLICATIONS: None immediate. PROCEDURE: An ultrasound guided thoracentesis was thoroughly discussed with the patient and questions answered. The benefits, risks, alternatives and complications were also discussed. The patient understands and wishes to proceed with the procedure. Written consent was obtained. Ultrasound was performed to localize and mark an adequate pocket of fluid in the right chest. The area was then prepped and draped in the normal sterile fashion. 1% Lidocaine was used for  local anesthesia. Under ultrasound guidance a 6 Fr Safe-T-Centesis catheter was introduced. Thoracentesis was performed. The catheter was removed and a dressing applied. FINDINGS: A total of approximately 1 liter of hazy, blood-tinged/amber fluid was removed. Samples were sent to the laboratory as requested by the clinical team. IMPRESSION: Successful ultrasound guided diagnostic and therapeutic right thoracentesis yielding 1 liter of pleural fluid. Follow-up chest x-ray revealed no pneumothorax. Read by: Rowe Robert, PA-C Electronically Signed   By: Corrie Mckusick D.O.   On: 04/15/2017 12:11    ASSESSMENT AND PLAN:  This is a very pleasant 57 years old African-American male with a stage IV non-small cell lung cancer, adenocarcinoma with no actionable mutations. He underwent systemic chemotherapy with carboplatin, Alimta and Avastin status post 6 cycles and has been tolerating the treatment fairly well. He is currently undergoing maintenance treatment with Alimta 500 MG/M2 every 3 weeks, status post 6 cycles. Unfortunately his serum creatinine is elevated today.  I will hold his treatment with Alimta for now until improvement of his renal function. We will arrange his treatment for next week if there is improvement in his serum creatinine. For the dehydration, I will arrange for the patient to receive 1 L of normal saline today. For hypertension, the patient will continue with his current blood pressure medication. He will come back for follow-up visit in 4 weeks for evaluation before starting cycle #8. He was advised to call if he has any concerning symptoms in the interval. The patient voices understanding of current disease status and treatment options and is in agreement with the current care plan. All questions were answered. The patient knows to call the clinic with any problems, questions or concerns. We can certainly see the patient much sooner if necessary.  Disclaimer: This note was  dictated with voice recognition software. Similar sounding words can inadvertently be transcribed and may not be corrected upon review.

## 2017-05-06 NOTE — Patient Instructions (Signed)
Dehydration, Adult Dehydration is when there is not enough fluid or water in your body. This happens when you lose more fluids than you take in. Dehydration can range from mild to very bad. It should be treated right away to keep it from getting very bad. Symptoms of mild dehydration may include:  Thirst.  Dry lips.  Slightly dry mouth.  Dry, warm skin.  Dizziness. Symptoms of moderate dehydration may include:  Very dry mouth.  Muscle cramps.  Dark pee (urine). Pee may be the color of tea.  Your body making less pee.  Your eyes making fewer tears.  Heartbeat that is uneven or faster than normal (palpitations).  Headache.  Light-headedness, especially when you stand up from sitting.  Fainting (syncope). Symptoms of very bad dehydration may include:  Changes in skin, such as: ? Cold and clammy skin. ? Blotchy (mottled) or pale skin. ? Skin that does not quickly return to normal after being lightly pinched and let go (poor skin turgor).  Changes in body fluids, such as: ? Feeling very thirsty. ? Your eyes making fewer tears. ? Not sweating when body temperature is high, such as in hot weather. ? Your body making very little pee.  Changes in vital signs, such as: ? Weak pulse. ? Pulse that is more than 100 beats a minute when you are sitting still. ? Fast breathing. ? Low blood pressure.  Other changes, such as: ? Sunken eyes. ? Cold hands and feet. ? Confusion. ? Lack of energy (lethargy). ? Trouble waking up from sleep. ? Short-term weight loss. ? Unconsciousness. Follow these instructions at home:  If told by your doctor, drink an ORS: ? Make an ORS by using instructions on the package. ? Start by drinking small amounts, about  cup (120 mL) every 5-10 minutes. ? Slowly drink more until you have had the amount that your doctor said to have.  Drink enough clear fluid to keep your pee clear or pale yellow. If you were told to drink an ORS, finish the ORS  first, then start slowly drinking clear fluids. Drink fluids such as: ? Water. Do not drink only water by itself. Doing that can make the salt (sodium) level in your body get too low (hyponatremia). ? Ice chips. ? Fruit juice that you have added water to (diluted). ? Low-calorie sports drinks.  Avoid: ? Alcohol. ? Drinks that have a lot of sugar. These include high-calorie sports drinks, fruit juice that does not have water added, and soda. ? Caffeine. ? Foods that are greasy or have a lot of fat or sugar.  Take over-the-counter and prescription medicines only as told by your doctor.  Do not take salt tablets. Doing that can make the salt level in your body get too high (hypernatremia).  Eat foods that have minerals (electrolytes). Examples include bananas, oranges, potatoes, tomatoes, and spinach.  Keep all follow-up visits as told by your doctor. This is important. Contact a doctor if:  You have belly (abdominal) pain that: ? Gets worse. ? Stays in one area (localizes).  You have a rash.  You have a stiff neck.  You get angry or annoyed more easily than normal (irritability).  You are more sleepy than normal.  You have a harder time waking up than normal.  You feel: ? Weak. ? Dizzy. ? Very thirsty.  You have peed (urinated) only a small amount of very dark pee during 6-8 hours. Get help right away if:  You have symptoms of   very bad dehydration.  You cannot drink fluids without throwing up (vomiting).  Your symptoms get worse with treatment.  You have a fever.  You have a very bad headache.  You are throwing up or having watery poop (diarrhea) and it: ? Gets worse. ? Does not go away.  You have blood or something green (bile) in your throw-up.  You have blood in your poop (stool). This may cause poop to look black and tarry.  You have not peed in 6-8 hours.  You pass out (faint).  Your heart rate when you are sitting still is more than 100 beats a  minute.  You have trouble breathing. This information is not intended to replace advice given to you by your health care provider. Make sure you discuss any questions you have with your health care provider. Document Released: 11/03/2008 Document Revised: 07/28/2015 Document Reviewed: 03/03/2015 Elsevier Interactive Patient Education  2018 [employer]vier Inc[employer]  

## 2017-05-14 ENCOUNTER — Inpatient Hospital Stay: Payer: Medicaid Other

## 2017-05-16 NOTE — Telephone Encounter (Signed)
Close encounter 

## 2017-05-19 ENCOUNTER — Other Ambulatory Visit: Payer: Self-pay | Admitting: *Deleted

## 2017-05-19 DIAGNOSIS — C3492 Malignant neoplasm of unspecified part of left bronchus or lung: Secondary | ICD-10-CM

## 2017-05-19 MED ORDER — OXYCODONE HCL 10 MG PO TABS: 10.0000 mg | ORAL_TABLET | ORAL | 0 refills | Status: DC | PRN

## 2017-05-19 NOTE — Telephone Encounter (Signed)
Facility where pt lives called regarding refill on pain medication as they will be picking it up for patient.. Refill printed, ready for pick up.

## 2017-05-20 ENCOUNTER — Encounter (HOSPITAL_COMMUNITY): Payer: Self-pay

## 2017-05-20 ENCOUNTER — Other Ambulatory Visit: Payer: Self-pay

## 2017-05-20 ENCOUNTER — Emergency Department (HOSPITAL_COMMUNITY)
Admission: EM | Admit: 2017-05-20 | Discharge: 2017-05-20 | Disposition: A | Discharge status: Skilled Nursing Facility | Payer: Medicaid Other | Attending: Emergency Medicine | Admitting: Emergency Medicine

## 2017-05-20 ENCOUNTER — Emergency Department (HOSPITAL_COMMUNITY): Payer: Medicaid Other

## 2017-05-20 DIAGNOSIS — R109 Unspecified abdominal pain: Secondary | ICD-10-CM | POA: Diagnosis not present

## 2017-05-20 DIAGNOSIS — N183 Chronic kidney disease, stage 3 (moderate): Secondary | ICD-10-CM | POA: Insufficient documentation

## 2017-05-20 DIAGNOSIS — G8929 Other chronic pain: Secondary | ICD-10-CM

## 2017-05-20 DIAGNOSIS — Z79899 Other long term (current) drug therapy: Secondary | ICD-10-CM | POA: Insufficient documentation

## 2017-05-20 DIAGNOSIS — K529 Noninfective gastroenteritis and colitis, unspecified: Secondary | ICD-10-CM

## 2017-05-20 DIAGNOSIS — Z85118 Personal history of other malignant neoplasm of bronchus and lung: Secondary | ICD-10-CM | POA: Diagnosis not present

## 2017-05-20 DIAGNOSIS — I129 Hypertensive chronic kidney disease with stage 1 through stage 4 chronic kidney disease, or unspecified chronic kidney disease: Secondary | ICD-10-CM | POA: Insufficient documentation

## 2017-05-20 DIAGNOSIS — R0602 Shortness of breath: Secondary | ICD-10-CM | POA: Diagnosis present

## 2017-05-20 DIAGNOSIS — F17210 Nicotine dependence, cigarettes, uncomplicated: Secondary | ICD-10-CM | POA: Diagnosis not present

## 2017-05-20 DIAGNOSIS — J441 Chronic obstructive pulmonary disease with (acute) exacerbation: Secondary | ICD-10-CM | POA: Diagnosis not present

## 2017-05-20 LAB — LIPASE, BLOOD: Lipase: 27 U/L (ref 11–51)

## 2017-05-20 LAB — COMPREHENSIVE METABOLIC PANEL
ALT: 7 U/L — ABNORMAL LOW (ref 17–63)
ANION GAP: 14 (ref 5–15)
AST: 20 U/L (ref 15–41)
Albumin: 3.3 g/dL — ABNORMAL LOW (ref 3.5–5.0)
Alkaline Phosphatase: 111 U/L (ref 38–126)
BUN: 22 mg/dL — ABNORMAL HIGH (ref 6–20)
CALCIUM: 9.8 mg/dL (ref 8.9–10.3)
CHLORIDE: 99 mmol/L — AB (ref 101–111)
CO2: 28 mmol/L (ref 22–32)
Creatinine, Ser: 2.37 mg/dL — ABNORMAL HIGH (ref 0.61–1.24)
GFR calc non Af Amer: 29 mL/min — ABNORMAL LOW (ref >60)
GFR, EST AFRICAN AMERICAN: 33 mL/min — AB (ref >60)
Glucose, Bld: 110 mg/dL — ABNORMAL HIGH (ref 65–99)
Potassium: 3.8 mmol/L (ref 3.5–5.1)
SODIUM: 141 mmol/L (ref 135–145)
Total Bilirubin: 0.8 mg/dL (ref 0.3–1.2)
Total Protein: 7.8 g/dL (ref 6.5–8.1)

## 2017-05-20 LAB — URINALYSIS, ROUTINE W REFLEX MICROSCOPIC
BACTERIA UA: NONE SEEN
BILIRUBIN URINE: NEGATIVE
GLUCOSE, UA: NEGATIVE mg/dL
Hgb urine dipstick: NEGATIVE
KETONES UR: 5 mg/dL — AB
Leukocytes, UA: NEGATIVE
Nitrite: NEGATIVE
PH: 5 (ref 5.0–8.0)
PROTEIN: 30 mg/dL — AB
Specific Gravity, Urine: 1.015 (ref 1.005–1.030)

## 2017-05-20 LAB — CBC WITH DIFFERENTIAL/PLATELET
Basophils Absolute: 0 10*3/uL (ref 0.0–0.1)
Basophils Relative: 0 %
EOS ABS: 0 10*3/uL (ref 0.0–0.7)
EOS PCT: 0 %
HCT: 33.6 % — ABNORMAL LOW (ref 39.0–52.0)
Hemoglobin: 11.3 g/dL — ABNORMAL LOW (ref 13.0–17.0)
LYMPHS ABS: 0.6 10*3/uL — AB (ref 0.7–4.0)
Lymphocytes Relative: 5 %
MCH: 34 pg (ref 26.0–34.0)
MCHC: 33.6 g/dL (ref 30.0–36.0)
MCV: 101.2 fL — ABNORMAL HIGH (ref 78.0–100.0)
MONOS PCT: 5 %
Monocytes Absolute: 0.6 10*3/uL (ref 0.1–1.0)
Neutro Abs: 10.8 10*3/uL — ABNORMAL HIGH (ref 1.7–7.7)
Neutrophils Relative %: 90 %
PLATELETS: 176 10*3/uL (ref 150–400)
RBC: 3.32 MIL/uL — ABNORMAL LOW (ref 4.22–5.81)
RDW: 16.5 % — ABNORMAL HIGH (ref 11.5–15.5)
WBC: 12 10*3/uL — AB (ref 4.0–10.5)

## 2017-05-20 LAB — I-STAT TROPONIN, ED: Troponin i, poc: 0 ng/mL (ref 0.00–0.08)

## 2017-05-20 LAB — INFLUENZA PANEL BY PCR (TYPE A & B)
INFLAPCR: NEGATIVE
Influenza B By PCR: NEGATIVE

## 2017-05-20 MED ORDER — DOXYCYCLINE HYCLATE 100 MG PO TABS: 100.0000 mg | ORAL_TABLET | Freq: Once | ORAL | Status: DC

## 2017-05-20 MED ORDER — DOXYCYCLINE HYCLATE 100 MG PO CAPS: 100.0000 mg | ORAL_CAPSULE | Freq: Two times a day (BID) | ORAL | 0 refills | Status: AC

## 2017-05-20 MED ORDER — IPRATROPIUM-ALBUTEROL 0.5-2.5 (3) MG/3ML IN SOLN
3.0000 mL | Freq: Once | RESPIRATORY_TRACT | Status: AC
  Administered 2017-05-20: 3 mL via RESPIRATORY_TRACT
  Filled 2017-05-20: qty 3

## 2017-05-20 MED ORDER — ONDANSETRON HCL 4 MG/2ML IJ SOLN
4.0000 mg | Freq: Once | INTRAMUSCULAR | Status: AC
Start: 2017-05-20 — End: 2017-05-20
  Administered 2017-05-20: 4 mg via INTRAVENOUS
  Filled 2017-05-20: qty 2

## 2017-05-20 MED ORDER — SODIUM CHLORIDE 0.9 % IV BOLUS
500.0000 mL | Freq: Once | INTRAVENOUS | Status: AC
  Administered 2017-05-20: 500 mL via INTRAVENOUS

## 2017-05-20 MED ORDER — OXYCODONE-ACETAMINOPHEN 5-325 MG PO TABS
1.0000 | ORAL_TABLET | Freq: Once | ORAL | Status: AC
Start: 2017-05-20 — End: 2017-05-20
  Administered 2017-05-20: 1 via ORAL
  Filled 2017-05-20: qty 1

## 2017-05-20 NOTE — ED Notes (Signed)
Report given to RN

## 2017-05-20 NOTE — ED Notes (Signed)
Notified [facility] for transportation to [place].

## 2017-05-20 NOTE — ED Notes (Signed)
Pt is c/o back and abd pain  Pt states he usually takes percocet for pain and would like something while he is waiting for [facility] to transport back

## 2017-05-20 NOTE — ED Notes (Signed)
Pt has urinal at bedside. Does know that we need a UA sample.

## 2017-05-20 NOTE — Discharge Instructions (Addendum)
You were given a prescription for antibiotics. Please take the antibiotic prescription fully.   I have prescribed a new medication for you today. It is important that when you pick the prescription up you discuss the potential interactions of this medication with other medications you are taking, including over the counter medications, with the pharmacists.   This new medication has potential side effects. Be sure to contact your primary care provider or return to the emergency department if you are experiencing new symptoms that you are unable to tolerate after starting the medication. You need to receive medical evaluation immediately if you start to experience blistering of the skin, rash, swelling, or difficulty breathing as these signs could indicate a more serious medication side effect.   Make sure that you are staying hydrated over the next several days since you have had multiple episodes of vomiting.  Please follow up with your primary care provider within 5-7 days for re-evaluation of your symptoms.  Please follow-up with your oncologist as you may need a CT scan of your chest as an outpatient.  Please return to the emergency department for any new or worsening symptoms.

## 2017-05-20 NOTE — ED Triage Notes (Signed)
Pt complains of shortness of breath, abdominal pain and back pain Pt has lung cancer and has had a cough this week

## 2017-05-20 NOTE — ED Notes (Signed)
Bed: [facility] Expected date:  Expected time:  Means of arrival:  Comments: 
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
## 2017-05-27 ENCOUNTER — Other Ambulatory Visit: Payer: Medicaid Other

## 2017-05-27 ENCOUNTER — Ambulatory Visit: Payer: Medicaid Other | Admitting: Oncology

## 2017-05-27 ENCOUNTER — Ambulatory Visit: Payer: Medicaid Other

## 2017-05-28 ENCOUNTER — Encounter (HOSPITAL_COMMUNITY): Payer: Self-pay | Admitting: Internal Medicine

## 2017-05-28 ENCOUNTER — Inpatient Hospital Stay (HOSPITAL_COMMUNITY)
Admission: EM | Admit: 2017-05-28 | Discharge: 2017-05-31 | DRG: 190 | Disposition: A | Discharge status: Skilled Nursing Facility | Payer: Medicaid Other | Attending: Internal Medicine | Admitting: Internal Medicine

## 2017-05-28 ENCOUNTER — Inpatient Hospital Stay (HOSPITAL_COMMUNITY): Payer: Medicaid Other

## 2017-05-28 ENCOUNTER — Ambulatory Visit: Payer: Medicaid Other | Admitting: Pulmonary Disease

## 2017-05-28 ENCOUNTER — Emergency Department (HOSPITAL_COMMUNITY): Payer: Medicaid Other

## 2017-05-28 ENCOUNTER — Other Ambulatory Visit: Payer: Self-pay

## 2017-05-28 DIAGNOSIS — C7951 Secondary malignant neoplasm of bone: Secondary | ICD-10-CM | POA: Diagnosis present

## 2017-05-28 DIAGNOSIS — N179 Acute kidney failure, unspecified: Secondary | ICD-10-CM | POA: Diagnosis present

## 2017-05-28 DIAGNOSIS — D631 Anemia in chronic kidney disease: Secondary | ICD-10-CM | POA: Diagnosis present

## 2017-05-28 DIAGNOSIS — J9601 Acute respiratory failure with hypoxia: Secondary | ICD-10-CM | POA: Diagnosis present

## 2017-05-28 DIAGNOSIS — Z7951 Long term (current) use of inhaled steroids: Secondary | ICD-10-CM | POA: Diagnosis not present

## 2017-05-28 DIAGNOSIS — I130 Hypertensive heart and chronic kidney disease with heart failure and stage 1 through stage 4 chronic kidney disease, or unspecified chronic kidney disease: Secondary | ICD-10-CM | POA: Diagnosis present

## 2017-05-28 DIAGNOSIS — Z515 Encounter for palliative care: Secondary | ICD-10-CM | POA: Diagnosis present

## 2017-05-28 DIAGNOSIS — N183 Chronic kidney disease, stage 3 (moderate): Secondary | ICD-10-CM | POA: Diagnosis present

## 2017-05-28 DIAGNOSIS — J441 Chronic obstructive pulmonary disease with (acute) exacerbation: Secondary | ICD-10-CM | POA: Diagnosis present

## 2017-05-28 DIAGNOSIS — Z9889 Other specified postprocedural states: Secondary | ICD-10-CM | POA: Diagnosis not present

## 2017-05-28 DIAGNOSIS — R0902 Hypoxemia: Secondary | ICD-10-CM

## 2017-05-28 DIAGNOSIS — D630 Anemia in neoplastic disease: Secondary | ICD-10-CM | POA: Diagnosis present

## 2017-05-28 DIAGNOSIS — J9600 Acute respiratory failure, unspecified whether with hypoxia or hypercapnia: Secondary | ICD-10-CM

## 2017-05-28 DIAGNOSIS — Z923 Personal history of irradiation: Secondary | ICD-10-CM | POA: Diagnosis not present

## 2017-05-28 DIAGNOSIS — J22 Unspecified acute lower respiratory infection: Secondary | ICD-10-CM | POA: Diagnosis present

## 2017-05-28 DIAGNOSIS — N19 Unspecified kidney failure: Secondary | ICD-10-CM | POA: Diagnosis not present

## 2017-05-28 DIAGNOSIS — J90 Pleural effusion, not elsewhere classified: Secondary | ICD-10-CM | POA: Diagnosis present

## 2017-05-28 DIAGNOSIS — D6481 Anemia due to antineoplastic chemotherapy: Secondary | ICD-10-CM | POA: Diagnosis present

## 2017-05-28 DIAGNOSIS — Z9981 Dependence on supplemental oxygen: Secondary | ICD-10-CM | POA: Diagnosis not present

## 2017-05-28 DIAGNOSIS — G40909 Epilepsy, unspecified, not intractable, without status epilepticus: Secondary | ICD-10-CM | POA: Diagnosis present

## 2017-05-28 DIAGNOSIS — Z79899 Other long term (current) drug therapy: Secondary | ICD-10-CM

## 2017-05-28 DIAGNOSIS — I5033 Acute on chronic diastolic (congestive) heart failure: Secondary | ICD-10-CM | POA: Diagnosis present

## 2017-05-28 DIAGNOSIS — Z66 Do not resuscitate: Secondary | ICD-10-CM | POA: Diagnosis present

## 2017-05-28 DIAGNOSIS — R0602 Shortness of breath: Secondary | ICD-10-CM | POA: Diagnosis present

## 2017-05-28 DIAGNOSIS — C3492 Malignant neoplasm of unspecified part of left bronchus or lung: Secondary | ICD-10-CM

## 2017-05-28 DIAGNOSIS — J9621 Acute and chronic respiratory failure with hypoxia: Secondary | ICD-10-CM | POA: Diagnosis present

## 2017-05-28 DIAGNOSIS — Z7189 Other specified counseling: Secondary | ICD-10-CM

## 2017-05-28 DIAGNOSIS — C3490 Malignant neoplasm of unspecified part of unspecified bronchus or lung: Secondary | ICD-10-CM | POA: Diagnosis not present

## 2017-05-28 LAB — BODY FLUID CELL COUNT WITH DIFFERENTIAL
Lymphs, Fluid: 74 %
Monocyte-Macrophage-Serous Fluid: 5 % — ABNORMAL LOW (ref 50–90)
Neutrophil Count, Fluid: 21 % (ref 0–25)
WBC FLUID: 1966 uL — AB (ref 0–1000)

## 2017-05-28 LAB — BLOOD GAS, ARTERIAL
ACID-BASE EXCESS: 0.7 mmol/L (ref 0.0–2.0)
Bicarbonate: 26.2 mmol/L (ref 20.0–28.0)
Drawn by: 331471
O2 CONTENT: 2.5 L/min
O2 SAT: 80.6 %
PATIENT TEMPERATURE: 98.6
PCO2 ART: 49.6 mmHg — AB (ref 32.0–48.0)
PH ART: 7.343 — AB (ref 7.350–7.450)
PO2 ART: 47.8 mmHg — AB (ref 83.0–108.0)

## 2017-05-28 LAB — PROTEIN, PLEURAL OR PERITONEAL FLUID: Total protein, fluid: 4.9 g/dL

## 2017-05-28 LAB — CBC
HCT: 25.9 % — ABNORMAL LOW (ref 39.0–52.0)
Hemoglobin: 8.4 g/dL — ABNORMAL LOW (ref 13.0–17.0)
MCH: 32.1 pg (ref 26.0–34.0)
MCHC: 32.4 g/dL (ref 30.0–36.0)
MCV: 98.9 fL (ref 78.0–100.0)
PLATELETS: 231 10*3/uL (ref 150–400)
RBC: 2.62 MIL/uL — AB (ref 4.22–5.81)
RDW: 16.1 % — ABNORMAL HIGH (ref 11.5–15.5)
WBC: 6.5 10*3/uL (ref 4.0–10.5)

## 2017-05-28 LAB — CBC WITH DIFFERENTIAL/PLATELET
BASOS PCT: 0 %
Basophils Absolute: 0 10*3/uL (ref 0.0–0.1)
Eosinophils Absolute: 0 10*3/uL (ref 0.0–0.7)
Eosinophils Relative: 0 %
HEMATOCRIT: 25.8 % — AB (ref 39.0–52.0)
Hemoglobin: 8.6 g/dL — ABNORMAL LOW (ref 13.0–17.0)
Lymphocytes Relative: 3 %
Lymphs Abs: 0.2 10*3/uL — ABNORMAL LOW (ref 0.7–4.0)
MCH: 33.6 pg (ref 26.0–34.0)
MCHC: 33.3 g/dL (ref 30.0–36.0)
MCV: 100.8 fL — ABNORMAL HIGH (ref 78.0–100.0)
MONO ABS: 0.4 10*3/uL (ref 0.1–1.0)
Monocytes Relative: 8 %
NEUTROS ABS: 4.7 10*3/uL (ref 1.7–7.7)
NEUTROS PCT: 89 %
Platelets: 198 10*3/uL (ref 150–400)
RBC: 2.56 MIL/uL — AB (ref 4.22–5.81)
RDW: 15.9 % — AB (ref 11.5–15.5)
WBC: 5.3 10*3/uL (ref 4.0–10.5)

## 2017-05-28 LAB — BASIC METABOLIC PANEL
ANION GAP: 16 — AB (ref 5–15)
BUN: 52 mg/dL — ABNORMAL HIGH (ref 6–20)
CALCIUM: 10.1 mg/dL (ref 8.9–10.3)
CO2: 26 mmol/L (ref 22–32)
Chloride: 89 mmol/L — ABNORMAL LOW (ref 101–111)
Creatinine, Ser: 3.71 mg/dL — ABNORMAL HIGH (ref 0.61–1.24)
GFR, EST AFRICAN AMERICAN: 19 mL/min — AB (ref >60)
GFR, EST NON AFRICAN AMERICAN: 17 mL/min — AB (ref >60)
Glucose, Bld: 160 mg/dL — ABNORMAL HIGH (ref 65–99)
POTASSIUM: 4.7 mmol/L (ref 3.5–5.1)
SODIUM: 131 mmol/L — AB (ref 135–145)

## 2017-05-28 LAB — BRAIN NATRIURETIC PEPTIDE: B NATRIURETIC PEPTIDE 5: 188.4 pg/mL — AB (ref 0.0–100.0)

## 2017-05-28 LAB — I-STAT TROPONIN, ED: Troponin i, poc: 0 ng/mL (ref 0.00–0.08)

## 2017-05-28 LAB — ALBUMIN, PLEURAL OR PERITONEAL FLUID: Albumin, Fluid: 2.7 g/dL

## 2017-05-28 LAB — CREATININE, SERUM
Creatinine, Ser: 3.5 mg/dL — ABNORMAL HIGH (ref 0.61–1.24)
GFR calc non Af Amer: 18 mL/min — ABNORMAL LOW (ref >60)
GFR, EST AFRICAN AMERICAN: 21 mL/min — AB (ref >60)

## 2017-05-28 LAB — MRSA PCR SCREENING: MRSA by PCR: NEGATIVE

## 2017-05-28 MED ORDER — FLUCONAZOLE 100 MG PO TABS
100.0000 mg | ORAL_TABLET | Freq: Every day | ORAL | Status: DC
  Administered 2017-05-28 – 2017-05-31 (×4): 100 mg via ORAL
  Filled 2017-05-28 (×4): qty 1

## 2017-05-28 MED ORDER — BISACODYL 10 MG RE SUPP: 10.0000 mg | Freq: Every day | RECTAL | Status: DC | PRN

## 2017-05-28 MED ORDER — ALBUTEROL SULFATE (2.5 MG/3ML) 0.083% IN NEBU
2.5000 mg | INHALATION_SOLUTION | RESPIRATORY_TRACT | Status: DC
  Filled 2017-05-28: qty 3

## 2017-05-28 MED ORDER — FUROSEMIDE 10 MG/ML IJ SOLN
40.0000 mg | Freq: Once | INTRAMUSCULAR | Status: AC
  Administered 2017-05-28: 40 mg via INTRAVENOUS
  Filled 2017-05-28: qty 4

## 2017-05-28 MED ORDER — PANTOPRAZOLE SODIUM 40 MG PO TBEC
40.0000 mg | DELAYED_RELEASE_TABLET | Freq: Two times a day (BID) | ORAL | Status: DC
  Administered 2017-05-28 – 2017-05-31 (×7): 40 mg via ORAL
  Filled 2017-05-28 (×7): qty 1

## 2017-05-28 MED ORDER — ONDANSETRON 4 MG PO TBDP
4.0000 mg | ORAL_TABLET | Freq: Three times a day (TID) | ORAL | Status: DC | PRN
Start: 2017-05-28 — End: 2017-05-31
  Administered 2017-05-30 – 2017-05-31 (×2): 4 mg via ORAL
  Filled 2017-05-28 (×2): qty 1

## 2017-05-28 MED ORDER — AMLODIPINE BESYLATE 10 MG PO TABS
10.0000 mg | ORAL_TABLET | Freq: Every day | ORAL | Status: DC
  Administered 2017-05-28 – 2017-05-31 (×4): 10 mg via ORAL
  Filled 2017-05-28: qty 1
  Filled 2017-05-28: qty 2
  Filled 2017-05-28 (×2): qty 1

## 2017-05-28 MED ORDER — GUAIFENESIN-DM 100-10 MG/5ML PO SYRP
5.0000 mL | ORAL_SOLUTION | ORAL | Status: DC | PRN
  Administered 2017-05-30 – 2017-05-31 (×4): 5 mL via ORAL
  Filled 2017-05-28 (×4): qty 10

## 2017-05-28 MED ORDER — ALBUTEROL SULFATE (2.5 MG/3ML) 0.083% IN NEBU
2.5000 mg | INHALATION_SOLUTION | RESPIRATORY_TRACT | Status: DC | PRN
  Administered 2017-05-29 – 2017-05-30 (×3): 2.5 mg via RESPIRATORY_TRACT
  Filled 2017-05-28 (×3): qty 3

## 2017-05-28 MED ORDER — SENNOSIDES-DOCUSATE SODIUM 8.6-50 MG PO TABS
1.0000 | ORAL_TABLET | Freq: Every day | ORAL | Status: DC
  Administered 2017-05-28 – 2017-05-30 (×3): 1 via ORAL
  Filled 2017-05-28 (×3): qty 1

## 2017-05-28 MED ORDER — FUROSEMIDE 10 MG/ML IJ SOLN: 20.0000 mg | Freq: Once | INTRAMUSCULAR | Status: DC

## 2017-05-28 MED ORDER — IPRATROPIUM-ALBUTEROL 0.5-2.5 (3) MG/3ML IN SOLN
3.0000 mL | Freq: Four times a day (QID) | RESPIRATORY_TRACT | Status: DC
  Administered 2017-05-29 – 2017-05-31 (×8): 3 mL via RESPIRATORY_TRACT
  Filled 2017-05-28 (×8): qty 3

## 2017-05-28 MED ORDER — METOPROLOL TARTRATE 25 MG PO TABS
25.0000 mg | ORAL_TABLET | Freq: Two times a day (BID) | ORAL | Status: DC
  Administered 2017-05-28 – 2017-05-31 (×6): 25 mg via ORAL
  Filled 2017-05-28 (×6): qty 1

## 2017-05-28 MED ORDER — HEPARIN SODIUM (PORCINE) 5000 UNIT/ML IJ SOLN
5000.0000 [IU] | Freq: Three times a day (TID) | INTRAMUSCULAR | Status: DC
  Filled 2017-05-28 (×4): qty 1

## 2017-05-28 MED ORDER — ACETAMINOPHEN 500 MG PO TABS: 500.0000 mg | ORAL_TABLET | Freq: Two times a day (BID) | ORAL | Status: DC | PRN

## 2017-05-28 MED ORDER — OXYCODONE HCL 5 MG PO TABS
10.0000 mg | ORAL_TABLET | ORAL | Status: DC | PRN
  Administered 2017-05-28 – 2017-05-31 (×8): 10 mg via ORAL
  Filled 2017-05-28 (×8): qty 2

## 2017-05-28 MED ORDER — ENSURE ENLIVE PO LIQD
237.0000 mL | Freq: Three times a day (TID) | ORAL | Status: DC
  Administered 2017-05-28 – 2017-05-31 (×7): 237 mL via ORAL
  Filled 2017-05-28 (×2): qty 237

## 2017-05-28 MED ORDER — GUAIFENESIN ER 600 MG PO TB12
600.0000 mg | ORAL_TABLET | Freq: Two times a day (BID) | ORAL | Status: DC
  Administered 2017-05-28 – 2017-05-31 (×7): 600 mg via ORAL
  Filled 2017-05-28 (×7): qty 1

## 2017-05-28 MED ORDER — POLYETHYLENE GLYCOL 3350 17 G PO PACK: 17.0000 g | PACK | Freq: Every day | ORAL | Status: DC | PRN

## 2017-05-28 MED ORDER — IPRATROPIUM-ALBUTEROL 0.5-2.5 (3) MG/3ML IN SOLN
3.0000 mL | Freq: Four times a day (QID) | RESPIRATORY_TRACT | Status: DC
  Administered 2017-05-28 (×2): 3 mL via RESPIRATORY_TRACT
  Filled 2017-05-28 (×2): qty 3

## 2017-05-28 MED ORDER — FLUTICASONE-UMECLIDIN-VILANT 100-62.5-25 MCG/INH IN AEPB: 1.0000 | INHALATION_SPRAY | Freq: Every day | RESPIRATORY_TRACT | Status: DC

## 2017-05-28 MED ORDER — MORPHINE SULFATE (CONCENTRATE) 10 MG/0.5ML PO SOLN
5.0000 mg | ORAL | Status: DC | PRN
  Filled 2017-05-28: qty 0.5

## 2017-05-28 MED ORDER — LIDOCAINE HCL 1 % IJ SOLN
INTRAMUSCULAR | Status: AC
  Filled 2017-05-28: qty 20

## 2017-05-28 MED ORDER — METHYLPREDNISOLONE SODIUM SUCC 125 MG IJ SOLR
60.0000 mg | Freq: Four times a day (QID) | INTRAMUSCULAR | Status: DC
  Administered 2017-05-28 – 2017-05-29 (×4): 60 mg via INTRAVENOUS
  Filled 2017-05-28 (×4): qty 2

## 2017-05-28 MED ORDER — ESCITALOPRAM OXALATE 10 MG PO TABS
5.0000 mg | ORAL_TABLET | Freq: Every day | ORAL | Status: DC
  Administered 2017-05-28 – 2017-05-30 (×3): 5 mg via ORAL
  Filled 2017-05-28 (×3): qty 1

## 2017-05-28 MED ORDER — IPRATROPIUM-ALBUTEROL 20-100 MCG/ACT IN AERS: 1.0000 | INHALATION_SPRAY | Freq: Four times a day (QID) | RESPIRATORY_TRACT | Status: DC

## 2017-05-28 NOTE — ED Notes (Signed)
[facility] notified of need for monitor in room to be fixed.

## 2017-05-28 NOTE — ED Provider Notes (Signed)
[facility] DEPT Provider Note   CSN: [id number] Arrival date & time: 05/28/17  0800     History   Chief Complaint Chief Complaint  Patient presents with  . Shortness of Breath    HPI [name] is a 57 y.o. 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  He also has a history of COPD and hypervolemia, chronic tobacco and alcohol abuse.  Patient is normally on 2 L via nasal cannula.  [facility] shortness of breath.  He states that he has had progressively worsening shortness of breath over the past 2 days.  According to EMS report the fire department found him with oxygen saturations at 69% on 2 L.  Prior to arrival the patient was given 10 mg albuterol treatment with improvement in his breathing status however he is still very labored.  Currently at 91% on 4 L via nasal cannula.  Patient denies fevers, chills.  He has had some swelling in his ankles.  He is unsure if he has any weight gain.  He denies hemoptysis.  HPI  Past Medical History:  Diagnosis Date  . Abdominal pain 06/04/2016  . Adenocarcinoma of left lung, stage 4 ([place]) 05/02/2016  . Alcohol abuse   . Bronchitis due to tobacco use ([place])   . Cancer ([place])    Lung  . COPD (chronic obstructive pulmonary disease) ([place])   . Dehydration 06/04/2016  . Encounter for antineoplastic chemotherapy 05/02/2016  . Gastritis   . Goals of care, counseling/discussion 05/02/2016  . Hematemesis   . HTN (hypertension) 10/30/2016  . Recurrent upper respiratory infection (URI)   . Seizures ([place]) 05/2011   new onset  . Shortness of breath     Patient Active Problem List   Diagnosis Date Noted  . Chronic respiratory failure with hypoxia ([place]) 04/01/2017  . Leucocytosis 03/16/2017  . CKD  (chronic kidney disease), stage III ([place]) 03/12/2017  . Malnutrition of moderate degree 03/10/2017  . COPD with acute exacerbation ([place]) 03/09/2017  . Anemia 02/07/2017  . Severe malnutrition ([place]) 01/03/2017  . DOE (dyspnea on exertion) 12/30/2016  . Nausea   . Hypervolemia   . HCAP (healthcare-associated pneumonia) 12/19/2016  . Dyspnea and respiratory abnormality 12/19/2016  . Erosive gastropathy 12/18/2016  . Gastritis 12/18/2016  . Hematemesis 12/16/2016  . Intractable nausea and vomiting 12/16/2016  . HTN (hypertension) 10/30/2016  . Chronic obstructive pulmonary disease ([place]) 09/02/2016  . COPD (chronic obstructive pulmonary disease) ([place]) 08/19/2016  . Dehydration 06/04/2016  . Abdominal pain 06/04/2016  . 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 Adenocarcinoma of left lung, stage 4 ([place]) 05/02/2016  . Goals of care, counseling/discussion 05/02/2016  . Encounter for antineoplastic chemotherapy 05/02/2016  . Tobacco abuse 05/30/2011  . Alcohol abuse 05/30/2011  . Seizure ([place]) 05/28/2011    Past Surgical History:  Procedure Laterality Date  . ESOPHAGOGASTRODUODENOSCOPY (EGD) WITH PROPOFOL N/A 12/17/2016   Procedure: ESOPHAGOGASTRODUODENOSCOPY (EGD) WITH PROPOFOL;  Surgeon: Ladene Artist, MD;  Location: [facility];  Service: Endoscopy;  Laterality: N/A;  . IR FLUORO GUIDE PORT INSERTION RIGHT  05/13/2016  . IR US GUIDE VASC ACCESS RIGHT  05/13/2016  . NO PAST SURGERIES          Home Medications    Prior to Admission medications  Medication Sig Start Date End Date Taking? Authorizing Provider  acetaminophen (TYLENOL) 500 MG tablet Take 500 mg by mouth 2 (two) times daily as needed for mild pain or headache.   Yes [provider]  albuterol (PROVENTIL) (2.5 MG/3ML) 0.083% nebulizer solution 1 neb every 4-6 hours as needed for wheezing and shortness of breath Patient taking differently: Take 2.5 mg by nebulization every 4 (four) hours as needed for wheezing  or shortness of breath.  04/01/17  Yes Parrett, Tammy S, NP  amLODipine (NORVASC) 10 MG tablet Take 10 mg by mouth daily.   Yes [provider]  Chlorphen-Phenyleph-ASA (ALKA-SELTZER PLUS COLD PO) Take 2 tablets by mouth at bedtime as needed (COUGH).   Yes [provider]  dexamethasone (DECADRON) 4 MG tablet Take 4 mg by mouth See admin instructions. Take 1 tablet twice daily the day before, the day of and the day after chemotherapy every 3 weeks.    Yes [provider]  doxycycline (VIBRA-TABS) 100 MG tablet Take 100 mg by mouth 2 (two) times daily. Started 7 day therapy on 05/22/17 05/22/17 05/28/17 Yes [provider]  escitalopram (LEXAPRO) 5 MG tablet Take 1 tablet (5 mg total) by mouth at bedtime. 04/17/17  Yes Rosita Fire, MD  feeding supplement, ENSURE ENLIVE, (ENSURE ENLIVE) LIQD Take 237 mLs by mouth 3 (three) times daily between meals. 03/19/17  Yes Shelly Coss, MD  fluconazole (DIFLUCAN) 100 MG tablet Take 1 tablet (100 mg total) by mouth daily. Patient taking differently: Take 100 mg by mouth daily. Continuous 03/20/17  Yes Shelly Coss, MD  furosemide (LASIX) 40 MG tablet Take 1 tablet (40 mg total) by mouth daily. 04/17/17  Yes Rosita Fire, MD  guaiFENesin (MUCINEX) 600 MG 12 hr tablet Take 1 tablet (600 mg total) by mouth 2 (two) times daily. 03/19/17  Yes Shelly Coss, MD  guaiFENesin-dextromethorphan (ROBITUSSIN DM) 100-10 MG/5ML syrup Take 5 mLs by mouth every 4 (four) hours as needed for cough. 04/17/17  Yes Rosita Fire, MD  loratadine (CLARITIN) 10 MG tablet Take 10 mg by mouth daily.   Yes [provider]  metoprolol tartrate (LOPRESSOR) 25 MG tablet Take 25 mg by mouth 2 (two) times daily.   Yes [provider]  ondansetron (ZOFRAN-ODT) 4 MG disintegrating tablet Take 4 mg by mouth every 8 (eight) hours as needed for nausea or vomiting.   Yes [provider]  Oxycodone HCl 10 MG TABS  Take 1 tablet (10 mg total) by mouth every 4 (four) hours as needed (or shortness of breath). Patient taking differently: Take 10 mg by mouth every 4 (four) hours as needed (for pain).  05/19/17  Yes Curt Bears, MD  pantoprazole (PROTONIX) 40 MG tablet Take 1 tablet (40 mg total) by mouth 2 (two) times daily. 12/18/16  Yes Eugenie Filler, MD  polyethylene glycol [facility] / Floria Raveling) packet Take 17 g by mouth daily as needed for mild constipation. 03/19/17  Yes Adhikari, Tamsen Meek, MD  senna-docusate (SENNA-PLUS) 8.6-50 MG tablet Take 1 tablet by mouth at bedtime.   Yes [provider]  Tiotropium Bromide-Olodaterol (STIOLTO RESPIMAT) 2.5-2.5 MCG/ACT AERS Inhale 2 puffs into the lungs daily.   Yes [provider]  Fluticasone-Umeclidin-Vilant (TRELEGY ELLIPTA) 100-62.5-25 MCG/INH AEPB Inhale 1 puff into the lungs daily. Patient not taking: Reported on 05/28/2017 04/01/17   Parrett, Fonnie Mu, NP  Ipratropium-Albuterol (COMBIVENT) 20-100 MCG/ACT AERS respimat Inhale 1 puff into the lungs every 6 (six) hours. Patient not taking:  Reported on 05/28/2017 07/22/16   Curt Bears, MD  predniSONE (DELTASONE) 10 MG tablet Take 1 tablet (10 mg total) by mouth daily. Take 3 tablets for 2 days, 2 tablets for 2 days, 1 tablet for 2 days and then stop. Patient not taking: Reported on 05/20/2017 04/17/17   Rosita Fire, MD    Family History Family History  Problem Relation Age of Onset  . Cancer Father   . Diabetes Mellitus II Mother     Social History Social History   Tobacco Use  . Smoking status: Current Some Day Smoker    Packs/day: 0.10    Years: 30.00    Pack years: 3.00    Types: Cigarettes  . Smokeless tobacco: Never Used  . Tobacco comment: 1-2 cig a day   Substance Use Topics  . Alcohol use: No    Frequency: Never  . Drug use: No     Allergies   Patient has no known allergies.   Review of Systems Review of Systems  Ten systems reviewed and are negative  for acute change, except as noted in the HPI.   Physical Exam Updated Vital Signs Ht 6' (1.829 m)   Wt 56.7 kg (125 lb)   SpO2 99%   BMI 16.95 kg/m   Physical Exam  Constitutional: He appears cachectic. He is active.  Non-toxic appearance. He has a sickly appearance. He does not appear ill. He appears distressed (Mild respiratory distress and labored breathing.).  HENT:  Head: Normocephalic and atraumatic.  Eyes: Pupils are equal, round, and reactive to light. EOM are normal.  Neck: JVD present.  Venous distenion of the Neck and face  Pulmonary/Chest: He is in respiratory distress. He has wheezes. He has rales in the right middle field, the right lower field, the left middle field and the left lower field.  Abdominal: Soft. Bowel sounds are normal.  Musculoskeletal: Normal range of motion.       Right lower leg: He exhibits edema.       Left lower leg: He exhibits edema.  Neurological: He is alert.  Skin: Capillary refill takes less than 2 seconds.  Nursing note and vitals reviewed.    ED Treatments / Results  Labs (all labs ordered are listed, but only abnormal results are displayed) Labs Reviewed  BASIC METABOLIC PANEL  BRAIN NATRIURETIC PEPTIDE  BLOOD GAS, ARTERIAL  I-STAT TROPONIN, ED    EKG None  Radiology No results found.  Procedures Procedures (including critical care time)  Medications Ordered in ED Medications  furosemide (LASIX) injection 40 mg (has no administration in time range)     Initial Impression / Assessment and Plan / ED Course  I have reviewed the triage vital signs and the nursing notes.  Pertinent labs & imaging results that were available during my care of the patient were reviewed by me and considered in my medical decision making (see chart for details).  Clinical Course as of May 29 1598  Wed May 28, 2017  0853 DG Chest Port 1 View [AH]  1027 CBC with Differential(!) [AH]  1028 B Natriuretic Peptide(!): 188.4 [MA]  1028 B  Natriuretic Peptide(!): 188.4 [MA]  1031 Patient hemoglobin 8.6.  His last hemoglobin 8 days ago was 11.  Previous hemoglobins were about 9.  I suspect may be volume contraction at the last blood draw as he appears to be near his baseline hemoglobin from previous lab values.  I also feel there is some hypervolemia given his significant bilateral  pitting edema and JVD.  I  Hemoglobin(!): 8.6 [AH]  1045 Patient has only had about 215 of urinary output put and he has no urinary retention.  Blood pressure has dropped.  Patient's oxygen saturation is 92% at 4-1/2 L.  His baseline is 2 L.   [AH]  1046 Patient also appears to have acute kidney injury with progressively worsening renal function over time.  Creatinine(!): 3.71 [AH]  1046 I suspect patient's respiratory issues are coming from accumulation of fluid in his pleural effusion. He will likely need thoracentesis again.   [AH]    Clinical Course User Index [AH] Margarita Mail, PA-C [MA] Charyl Dancer, Student-PA    Patient with right-sided pleural effusion and dyspnea, hypoxia beyond baseline.  Questionable COPD exacerbation.  He continues to feel short of breath.  Creatinine is worsening over time.  Patient will need admission and I have spoken with interventional radiology will perform thoracentesis.  Patient is stable throughout his visit here on 4-1/2 L of oxygen via nasal cannula.  Final Clinical Impressions(s) / ED Diagnoses   Final diagnoses:  None    ED Discharge Orders    None       Margarita Mail, PA-C 05/28/17 1601    Daleen Bo, MD 05/28/17 [phone]

## 2017-05-28 NOTE — ED Notes (Signed)
Patient transported to Ultrasound 

## 2017-05-28 NOTE — Procedures (Signed)
Ultrasound-guided diagnostic and therapeutic right thoracentesis performed yielding 1.2 liters of bloody fluid. No immediate complications. Follow-up chest x-ray pending.The fluid was sent to the lab for preordered studies.

## 2017-05-28 NOTE — Progress Notes (Signed)
Pt arrived to unit from ED on stretcher. Pt stood and ambulated from stretcher to bed with steady, independent gait. ORiented to cvallbell and environment. POC discussed. VSS. He does have some DOE but states his "breathing is so much better than before".

## 2017-05-28 NOTE — H&P (Addendum)
History and Physical  RAI SEVERNS YBW:[id number] DOB: Jun 12, 1960 DOA: 05/28/2017  PCP: [name], MD   Chief Complaint: sob  HPI:  29 [facility] resident Copd still smoker stg 4 cancer [mets to spine] Rx Dr. Earlie Server sp xrt and chemo [alimta--held 4/19 2/2 aki per Dr. 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 etc.-at that admission started Lasix because of lower extremity edema   ED Course: Returns to the emergency room with 2-day history 5/8 increasing shortness of breath fire, sats 60% given also albuterol on route department called-on initial evaluation in ED given Lasix because of elevated JVD which seemed to make some difference baseline oxygen is 2 L  Tells me that he has been on oxygen ~ 4-5 mo--He still smokes 1-2 [cut down from 1ppd-congratulated] states he has been short of breath over the past couple of days-his usual walking distance without shortness of breath is only about 10 feet-he has been told Dr. Earlie Server that his cancer is incurable and is a DNR according to my discussion with both him and Dr. Earlie Server  He tells me that he is not really had any sputum and is not aware of what medications he takes as he is given those medications at the facility where he lives at-he feels however when he came to the emergency room that the albuterol seemed to help more than the Lasix did He does not tell me any issues concerning for PND or orthopnea he has no chest pain    Review of Systems:   Negative for fever, visual changes, sore throat, rash, new muscle aches, chest pain, dysuria, bleeding, n/v/abdominal pain.  Past Medical History:  Diagnosis Date  . Abdominal pain 06/04/2016  . Adenocarcinoma of left lung, stage 4 ([place]) 05/02/2016  . Alcohol abuse   .  Bronchitis due to tobacco use ([place])   . Cancer ([place])    Lung  . COPD (chronic obstructive pulmonary disease) ([place])   . Dehydration 06/04/2016  . Encounter for antineoplastic chemotherapy 05/02/2016  . Gastritis   . Goals of care, counseling/discussion 05/02/2016  . Hematemesis   . HTN (hypertension) 10/30/2016  . Recurrent upper respiratory infection (URI)   . Seizures ([place]) 05/2011   new onset  . Shortness of breath     Past Surgical History:  Procedure Laterality Date  . ESOPHAGOGASTRODUODENOSCOPY (EGD) WITH PROPOFOL N/A 12/17/2016   Procedure: ESOPHAGOGASTRODUODENOSCOPY (EGD) WITH PROPOFOL;  Surgeon: Ladene Artist, MD;  Location: [facility];  Service: Endoscopy;  Laterality: N/A;  . IR FLUORO GUIDE PORT INSERTION RIGHT  05/13/2016  . IR US GUIDE VASC ACCESS RIGHT  05/13/2016  . NO PAST SURGERIES       reports that he has been smoking cigarettes.  He has a 3.00 pack-year smoking history. He has never used smokeless tobacco. He reports that he does not drink alcohol or use drugs. Mobility: *Mobilizes by himself but is overall frail has lost about 20 pounds over the past couple of months used to work in [employer] and used to be much larger and stronger**  No Known Allergies  Family History  Problem Relation Age of Onset  . Cancer Father   . Diabetes Mellitus II Mother      Prior to Admission medications   Medication Sig Start Date End Date Taking? Authorizing Provider  acetaminophen (TYLENOL) 500  MG tablet Take 500 mg by mouth 2 (two) times daily as needed for mild pain or headache.   Yes [provider]  albuterol (PROVENTIL) (2.5 MG/3ML) 0.083% nebulizer solution 1 neb every 4-6 hours as needed for wheezing and shortness of breath Patient taking differently: Take 2.5 mg by nebulization every 4 (four) hours as needed for wheezing or shortness of breath.  04/01/17  Yes Parrett, Tammy S, NP  amLODipine (NORVASC) 10 MG tablet Take 10 mg by mouth daily.   Yes  [provider]  Chlorphen-Phenyleph-ASA (ALKA-SELTZER PLUS COLD PO) Take 2 tablets by mouth at bedtime as needed (COUGH).   Yes [provider]  dexamethasone (DECADRON) 4 MG tablet Take 4 mg by mouth See admin instructions. Take 1 tablet twice daily the day before, the day of and the day after chemotherapy every 3 weeks.    Yes [provider]  doxycycline (VIBRA-TABS) 100 MG tablet Take 100 mg by mouth 2 (two) times daily. Started 7 day therapy on 05/22/17 05/22/17 05/28/17 Yes [provider]  escitalopram (LEXAPRO) 5 MG tablet Take 1 tablet (5 mg total) by mouth at bedtime. 04/17/17  Yes Rosita Fire, MD  feeding supplement, ENSURE ENLIVE, (ENSURE ENLIVE) LIQD Take 237 mLs by mouth 3 (three) times daily between meals. 03/19/17  Yes Shelly Coss, MD  fluconazole (DIFLUCAN) 100 MG tablet Take 1 tablet (100 mg total) by mouth daily. Patient taking differently: Take 100 mg by mouth daily. Continuous 03/20/17  Yes Shelly Coss, MD  furosemide (LASIX) 40 MG tablet Take 1 tablet (40 mg total) by mouth daily. 04/17/17  Yes Rosita Fire, MD  guaiFENesin (MUCINEX) 600 MG 12 hr tablet Take 1 tablet (600 mg total) by mouth 2 (two) times daily. 03/19/17  Yes Shelly Coss, MD  guaiFENesin-dextromethorphan (ROBITUSSIN DM) 100-10 MG/5ML syrup Take 5 mLs by mouth every 4 (four) hours as needed for cough. 04/17/17  Yes Rosita Fire, MD  loratadine (CLARITIN) 10 MG tablet Take 10 mg by mouth daily.   Yes [provider]  metoprolol tartrate (LOPRESSOR) 25 MG tablet Take 25 mg by mouth 2 (two) times daily.   Yes [provider]  ondansetron (ZOFRAN-ODT) 4 MG disintegrating tablet Take 4 mg by mouth every 8 (eight) hours as needed for nausea or vomiting.   Yes [provider]  Oxycodone HCl 10 MG TABS Take 1 tablet (10 mg total) by mouth every 4 (four) hours as needed (or shortness of breath). Patient taking differently: Take 10  mg by mouth every 4 (four) hours as needed (for pain).  05/19/17  Yes Curt Bears, MD  pantoprazole (PROTONIX) 40 MG tablet Take 1 tablet (40 mg total) by mouth 2 (two) times daily. 12/18/16  Yes Eugenie Filler, MD  polyethylene glycol [facility] / Floria Raveling) packet Take 17 g by mouth daily as needed for mild constipation. 03/19/17  Yes Adhikari, Tamsen Meek, MD  senna-docusate (SENNA-PLUS) 8.6-50 MG tablet Take 1 tablet by mouth at bedtime.   Yes [provider]  Tiotropium Bromide-Olodaterol (STIOLTO RESPIMAT) 2.5-2.5 MCG/ACT AERS Inhale 2 puffs into the lungs daily.   Yes [provider]  Fluticasone-Umeclidin-Vilant (TRELEGY ELLIPTA) 100-62.5-25 MCG/INH AEPB Inhale 1 puff into the lungs daily. Patient not taking: Reported on 05/28/2017 04/01/17   Parrett, Fonnie Mu, NP  Ipratropium-Albuterol (COMBIVENT) 20-100 MCG/ACT AERS respimat Inhale 1 puff into the lungs every 6 (six) hours. Patient not taking: Reported on 05/28/2017 07/22/16   Curt Bears, MD  predniSONE (DELTASONE) 10  MG tablet Take 1 tablet (10 mg total) by mouth daily. Take 3 tablets for 2 days, 2 tablets for 2 days, 1 tablet for 2 days and then stop. Patient not taking: Reported on 05/20/2017 04/17/17   Rosita Fire, MD    Physical Exam:   Awake alert cachectic no icterus no pallor JVD moderately elevated at 30 degrees  S1-S2 no murmur rub or gallop  Decreased air entry right posterior lung fields increased fremitus and resonance  Abdomen soft nontender nondistended cachectic  Radiation burns to back and anterior abdomen  Grade 1 lower extremity edema  Neurologically intact moving all 4 limbs equally  Psych euthymic but slightly depressed  I have personally reviewed following labs and imaging studies  Labs:   Sodium 131 BUN/creatinine 52/3.7 baseline between 1.8-2.4 BNP 188, hemoglobin 8.6 prior trans-8-9 predominant left shift  Imaging studies:   Chest x-ray =  increased right pleural  effusion moderate volume  Medical tests:   EKG independently reviewed: LVH met no ST-T wave acute changes criteria for nificant  Test discussed with performing physician:  Discussed with Ms. Harris of the emergency room--- She is agreeable to discussion with IR to tap   Review and summation of old records:   Performed in detail   Active Problems:   * No active hospital problems. * Multifactorial dyspnea  Assessment/Plan -Multifactorial dyspnea-possible component underdiagnosed or undiagnosed CHF main driving force is AE COPD in my opinion We will start him on IV Lasix 20 daily and I have added another dose of 20 to his 40 that he received today in the emergency room ?stop smoking unless he becomes hospice [then paradoxically he can do as he pleases] I discussed in detail with Dr. Earlie Server his presentation findings and Dr. Earlie Server is okay with [place] calling palliative care to see him as they have had this discussion in the past in the outpatient setting I doubt he is a good candidate for any systemic chemotherapy for his other cancer diagnosis   Stage IV metastatic adenocarcinoma lung with mets to the spine-see above discussion We will continue his outpatient pain regimen-he is hospice eligible based on my discussions and review of his weight loss, intractable symptoms, recurrent effusions Continue fluconazole for prophylaxis versus thrush--may need to be renally dosed  Recurrent pleural effusion-probably exudative and malignancy related-I will diurese him once again as above and repeat an x-ray at 3 PM to see if this clears-if this does not then he would benefit from a Pleurx for comfort  Acute kidney injury-Baseline as above-I will diuresis once again x1 and then reassess His prognosis overall is poor-he is not a dialysis candidate As above  Possible decompensated heart failure although less likely-BNP only 118 Continue metoprolol Grade 2 diastolic dysfunction based on echo  03/18/2017-mild PA peak pressures 42 As able Ambulate--will need oxygen on discharge  Anemia of malignancy/renal disease- Iron 37 and saturation ratio 17 in 2018-ferritin is an acute phase reactant-would not transfuse at this stage   Severity of Illness: The appropriate patient status for this patient is INPATIENT. Inpatient status is judged to be reasonable and necessary in order to provide the required intensity of service to ensure the patient's safety. The patient's presenting symptoms, physical exam findings, and initial radiographic and laboratory data in the context of their chronic comorbidities is felt to place them at high risk for further clinical deterioration. Furthermore, it is not anticipated that the patient will be medically stable for discharge from the hospital within 2  midnights of admission. The following factors support the patient status of inpatient.   " The patient's presenting symptoms include shortness of breath weakness elevated JVD. " The worrisome physical exam findings include dullness to percussion and elevated JVD as well as dyspnea. " The initial radiographic and laboratory data are worrisome because of the above. " The chronic co-morbidities include multiple.   * I certify that at the point of admission it is my clinical judgment that the patient will require inpatient hospital care spanning beyond 2 midnights from the point of admission due to high intensity of service, high risk for further deterioration and high frequency of surveillance required.*   We will admit patient temporarily to stepdown unit with close monitoring he may be able to go to telemetry in 48 hours  DVT prophylaxis: Lovenox Code Status: DNR confirmed Family Communication: None present Consults called: Palliative care team discussed with and I have discussed in detail care with Dr. Earlie Server who is available if needed-he will see the patient depending on input from palliative care  Time  spent: 60 minutes  Verneita Griffes, MD [facility] [phone]  05/28/2017, 11:31 AM      addend--Effusion looks exudative. Patient also asked nursing to change Code status to FULL CODE--I have encouraged him to think further on this with Palliative care and will maintain DNR code status at this time

## 2017-05-28 NOTE — ED Notes (Signed)
ED Provider at bedside. 

## 2017-05-28 NOTE — ED Triage Notes (Signed)
Pt here via [facility] from [facility] c/o shortness of breath that has progressed over the last 2 hours. Denies fevers, chest pain. Pt states that he "feels fine" Audible wheezing and JVD noted by this RN and PA-C. Pt given 10mg  albuterol and 0.5 atrovent by [facility].

## 2017-05-28 NOTE — ED Provider Notes (Signed)
  Face-to-face evaluation   History: Presents for evaluation of shortness of breath, acutely worse today associated with hypoxia.  Currently taking doxycycline for respiratory infection diagnosed and treated in the ED recently.  He did not use a nebulizer this morning.  He is on chronic oxygen therapy, at home.  Physical exam: Slender, under nourished male, dyspneic.  Lungs with decreased air well bilaterally, scattered wheezes.  Mild increased work of breathing.  Jugular venous distention is present.  Medical screening examination/treatment/procedure(s) were conducted as a shared visit with non-physician practitioner(s) and myself.  I personally evaluated the patient during the encounter    Daleen Bo, MD 05/28/17 2004

## 2017-05-28 NOTE — ED Notes (Signed)
Bed: CH88 Expected date:  Expected time:  Means of arrival:  Comments: EMS/lung cancer/shob

## 2017-05-29 ENCOUNTER — Inpatient Hospital Stay (HOSPITAL_COMMUNITY): Payer: Medicaid Other

## 2017-05-29 DIAGNOSIS — N179 Acute kidney failure, unspecified: Secondary | ICD-10-CM

## 2017-05-29 DIAGNOSIS — R0902 Hypoxemia: Secondary | ICD-10-CM

## 2017-05-29 DIAGNOSIS — C3490 Malignant neoplasm of unspecified part of unspecified bronchus or lung: Secondary | ICD-10-CM

## 2017-05-29 DIAGNOSIS — Z9889 Other specified postprocedural states: Secondary | ICD-10-CM

## 2017-05-29 DIAGNOSIS — N183 Chronic kidney disease, stage 3 (moderate): Secondary | ICD-10-CM

## 2017-05-29 DIAGNOSIS — R0602 Shortness of breath: Secondary | ICD-10-CM

## 2017-05-29 DIAGNOSIS — N19 Unspecified kidney failure: Secondary | ICD-10-CM

## 2017-05-29 DIAGNOSIS — J441 Chronic obstructive pulmonary disease with (acute) exacerbation: Principal | ICD-10-CM

## 2017-05-29 LAB — COMPREHENSIVE METABOLIC PANEL
ALT: 8 U/L — ABNORMAL LOW (ref 17–63)
ANION GAP: 14 (ref 5–15)
AST: 20 U/L (ref 15–41)
Albumin: 3 g/dL — ABNORMAL LOW (ref 3.5–5.0)
Alkaline Phosphatase: 87 U/L (ref 38–126)
BUN: 59 mg/dL — ABNORMAL HIGH (ref 6–20)
CO2: 25 mmol/L (ref 22–32)
Calcium: 9.3 mg/dL (ref 8.9–10.3)
Chloride: 95 mmol/L — ABNORMAL LOW (ref 101–111)
Creatinine, Ser: 3.06 mg/dL — ABNORMAL HIGH (ref 0.61–1.24)
GFR calc Af Amer: 24 mL/min — ABNORMAL LOW (ref >60)
GFR calc non Af Amer: 21 mL/min — ABNORMAL LOW (ref >60)
GLUCOSE: 200 mg/dL — AB (ref 65–99)
POTASSIUM: 4.2 mmol/L (ref 3.5–5.1)
SODIUM: 134 mmol/L — AB (ref 135–145)
Total Bilirubin: 0.4 mg/dL (ref 0.3–1.2)
Total Protein: 6.6 g/dL (ref 6.5–8.1)

## 2017-05-29 LAB — GLUCOSE, CAPILLARY
Glucose-Capillary: 196 mg/dL — ABNORMAL HIGH (ref 65–99)
Glucose-Capillary: 141 mg/dL — ABNORMAL HIGH (ref 65–99)
Glucose-Capillary: 199 mg/dL — ABNORMAL HIGH (ref 65–99)

## 2017-05-29 LAB — CBC
HCT: 22.9 % — ABNORMAL LOW (ref 39.0–52.0)
HEMOGLOBIN: 7.7 g/dL — AB (ref 13.0–17.0)
MCH: 33.6 pg (ref 26.0–34.0)
MCHC: 33.6 g/dL (ref 30.0–36.0)
MCV: 100 fL (ref 78.0–100.0)
Platelets: 199 10*3/uL (ref 150–400)
RBC: 2.29 MIL/uL — ABNORMAL LOW (ref 4.22–5.81)
RDW: 16.4 % — AB (ref 11.5–15.5)
WBC: 5.7 10*3/uL (ref 4.0–10.5)

## 2017-05-29 MED ORDER — METHYLPREDNISOLONE SODIUM SUCC 40 MG IJ SOLR
40.0000 mg | Freq: Three times a day (TID) | INTRAMUSCULAR | Status: DC
  Administered 2017-05-29 – 2017-05-31 (×7): 40 mg via INTRAVENOUS
  Filled 2017-05-29 (×7): qty 1

## 2017-05-29 MED ORDER — SODIUM CHLORIDE 0.9 % IV SOLN
INTRAVENOUS | Status: DC
  Administered 2017-05-29 – 2017-05-30 (×3): via INTRAVENOUS

## 2017-05-29 MED ORDER — INSULIN ASPART 100 UNIT/ML ~~LOC~~ SOLN
0.0000 [IU] | Freq: Three times a day (TID) | SUBCUTANEOUS | Status: DC
  Administered 2017-05-29 – 2017-05-30 (×2): 2 [IU] via SUBCUTANEOUS
  Administered 2017-05-30: 1 [IU] via SUBCUTANEOUS
  Administered 2017-05-30 – 2017-05-31 (×2): 3 [IU] via SUBCUTANEOUS
  Administered 2017-05-31: 2 [IU] via SUBCUTANEOUS

## 2017-05-29 MED ORDER — SODIUM CHLORIDE 0.9% FLUSH: 10.0000 mL | INTRAVENOUS | Status: DC | PRN

## 2017-05-29 NOTE — Consult Note (Signed)
Consultation Note Date: 05/29/2017   Patient Name: [name]  DOB: [birth year]  MRN: [id number]  Age / Sex: 57 y.o., male  PCP: [name], MD Referring Physician: Aline August, MD  Reason for Consultation: Establishing goals of care and symptom management  HPI/Patient Profile: 57 y.o. male  with past medical history of COPD and stage IV NSCLC, spinal mets, s/p radiation therapy, currently undergoing chemotherapy, seizure d/o, diastolic dysfunction admitted on 05/28/2017 with COPD exacerbation.  He has continued to have increased SOB with any ambulation.  Palliative consulted for symptom management and goals of care.   Clinical Assessment and Goals of Care: I met today with Mr. Chrostowski.  He is known to me from prior admission.  His bedside RN reports that he has been asking questions about code status and needing to wear DNR bracelet.  We focused our discussion on this today.  He reports that his main goal is to "feel better and live as long as I can,"  Which is consistent with prior admission.    Questions and concerns addressed.   PMT will continue to support holistically.  SUMMARY OF RECOMMENDATIONS   - DNR-  We discussed his conversation with Dr. Verlon Au.  Patient reports that terminology is sometimes confusing and frightens him.  He is in agreement that his goal at this point is to live as well as he can for as long as he can, but also to die as naturally as possible when the time comes.  Benefits/burdens of heroic interventions in advanced cancer at the time of respiratory or cardiac arrest discussed, and he is in agreement with DNR but refuses to wear bracelet as "constant reminder I am going to die." - Dyspnea- With his renal impairment, recommend continue oxycodone 73m every 4 hours as needed for pain or SOB.  Could also consider starting low dose long acting medication.     Code  Status/Advance Care Planning:  Full code   Symptom Management:   As above  Palliative Prophylaxis:   Bowel Regimen  Additional Recommendations (Limitations, Scope, Preferences):  Full Scope Treatment  Psycho-social/Spiritual:   Desire for further Chaplaincy support:yes  Additional Recommendations: Caregiving  Support/Resources  Prognosis:   Guarded.  His last chemo was held secondary to renal impairment, and Cr is higher this admit.  I believe that his prognosis, if he does not receive further disease modifying therapy, will be less than 6 months if his disease follows its natural course and he should qualify for hospice if he desires.  Will discuss further again tomorrow.  Discharge Planning: To Be Determined      Primary Diagnoses: Present on Admission: . Acute respiratory failure ([place] . Acute respiratory infection   I have reviewed the medical record, interviewed the patient and family, and examined the patient. The following aspects are pertinent.  Past Medical History:  Diagnosis Date  . Abdominal pain 06/04/2016  . Adenocarcinoma of left lung, stage 4 ([place] 05/02/2016  . Alcohol abuse   . Bronchitis due to tobacco  use ([place])   . Cancer ([place])    Lung  . COPD (chronic obstructive pulmonary disease) ([place])   . Dehydration 06/04/2016  . Encounter for antineoplastic chemotherapy 05/02/2016  . Gastritis   . Goals of care, counseling/discussion 05/02/2016  . Hematemesis   . HTN (hypertension) 10/30/2016  . Recurrent upper respiratory infection (URI)   . Seizures ([place]) 05/2011   new onset  . Shortness of breath    Social History   Socioeconomic History  . Marital status: Single    Spouse name: Not on file  . Number of children: Not on file  . Years of education: Not on file  . Highest education level: Not on file  Occupational History  . Not on file  Social Needs  . Financial resource strain: Not on file  . Food insecurity:    Worry: Not on file     Inability: Not on file  . Transportation needs:    Medical: Not on file    Non-medical: Not on file  Tobacco Use  . Smoking status: Current Some Day Smoker    Packs/day: 0.10    Years: 30.00    Pack years: 3.00    Types: Cigarettes  . Smokeless tobacco: Never Used  . Tobacco comment: 1-2 cig a day   Substance and Sexual Activity  . Alcohol use: No    Frequency: Never  . Drug use: No  . Sexual activity: Yes  Lifestyle  . Physical activity:    Days per week: Not on file    Minutes per session: Not on file  . Stress: Not on file  Relationships  . Social connections:    Talks on phone: Not on file    Gets together: Not on file    Attends religious service: Not on file    Active member of club or organization: Not on file    Attends meetings of clubs or organizations: Not on file    Relationship status: Not on file  Other Topics Concern  . Not on file  Social History Narrative  . Not on file   Family History  Problem Relation Age of Onset  . Cancer Father   . Diabetes Mellitus II Mother    Scheduled Meds: . amLODipine  10 mg Oral Daily  . escitalopram  5 mg Oral QHS  . feeding supplement (ENSURE ENLIVE)  237 mL Oral TID BM  . fluconazole  100 mg Oral Daily  . guaiFENesin  600 mg Oral BID  . heparin  5,000 Units Subcutaneous Q8H  . insulin aspart  0-9 Units Subcutaneous TID WC  . ipratropium-albuterol  3 mL Nebulization Q6H WA  . methylPREDNISolone (SOLU-MEDROL) injection  60 mg Intravenous Q6H  . metoprolol tartrate  25 mg Oral BID  . pantoprazole  40 mg Oral BID  . senna-docusate  1 tablet Oral QHS   Continuous Infusions: PRN Meds:.acetaminophen, albuterol, bisacodyl, guaiFENesin-dextromethorphan, morphine CONCENTRATE, ondansetron, oxyCODONE, polyethylene glycol, sodium chloride flush Medications Prior to Admission:  Prior to Admission medications   Medication Sig Start Date End Date Taking? Authorizing Provider  acetaminophen (TYLENOL) 500 MG tablet Take 500 mg  by mouth 2 (two) times daily as needed for mild pain or headache.   Yes [provider]  amLODipine (NORVASC) 10 MG tablet Take 10 mg by mouth daily.   Yes [provider]  bisoprolol (ZEBETA) 5 MG tablet Take 1 tablet (5 mg total) by mouth daily. 02/16/17  Yes Florencia Reasons, MD  Chlorphen-Phenyleph-ASA [place] PLUS  COLD PO) Take 2 tablets by mouth at bedtime as needed (COUGH).   Yes [provider]  dexamethasone (DECADRON) 4 MG tablet Take 4 mg by mouth as directed. Take 1 tablet twice daily the day before, the day of and the day after chemotherapy every 3 weeks.   Yes [provider]  furosemide (LASIX) 40 MG tablet Take 1 tablet (40 mg total) by mouth every three (3) days as needed for fluid or edema. 02/16/17 03/18/17 Yes Florencia Reasons, MD  guaiFENesin (MUCINEX) 600 MG 12 hr tablet Take 1 tablet (600 mg total) by mouth 2 (two) times daily. 02/16/17  Yes Florencia Reasons, MD  Ipratropium-Albuterol (COMBIVENT) 20-100 MCG/ACT AERS respimat Inhale 1 puff into the lungs every 6 (six) hours. 07/22/16  Yes Curt Bears, MD  lidocaine-prilocaine (EMLA) cream Apply 1 application topically as needed.   Yes [provider]  loratadine (CLARITIN) 10 MG tablet Take 10 mg by mouth daily.   Yes [provider]  mometasone-formoterol (DULERA) 100-5 MCG/ACT AERO Inhale 2 puffs into the lungs 2 (two) times daily. 12/24/16  Yes Eugenie Filler, MD  morphine (MSIR) 15 MG tablet Take 15 mg by mouth every 4 (four) hours as needed for severe pain.   Yes [provider]  ondansetron (ZOFRAN-ODT) 4 MG disintegrating tablet Take 4 mg by mouth every 8 (eight) hours as needed for nausea or vomiting.   Yes [provider]  pantoprazole (PROTONIX) 40 MG tablet Take 1 tablet (40 mg total) by mouth 2 (two) times daily. 12/18/16  Yes Eugenie Filler, MD  senna-docusate (SENNA-PLUS) 8.6-50 MG tablet Take 1 tablet by mouth at bedtime.   Yes [provider]    predniSONE (DELTASONE) 10 MG tablet Label  & dispense according to the schedule below. 4 Pills PO on day one then, 3 Pills PO on day two, 2 Pills PO on day three, 1Pills PO on day four,  then STOP.  Total of 10 tabs Patient not taking: Reported on 03/09/2017 02/16/17   Florencia Reasons, MD   No Known Allergies Review of Systems  Constitutional: Positive for activity change and fatigue.  Respiratory: Positive for cough, shortness of breath and wheezing.   Neurological: Positive for weakness.  Psychiatric/Behavioral: Positive for sleep disturbance.    Physical Exam  General: Alert, awake, in no acute distress. Thin and lying in bed watching TV.  Mild increase in WOB. HEENT: No bruits, no goiter, no JVD Heart: Regular rate and rhythm. No murmur appreciated. Lungs: Diminished air movement Abdomen: Soft, nontender, nondistended, positive bowel sounds.  Ext: No significant edema Skin: Warm and dry Neuro: Grossly intact, nonfocal.   Vital Signs: BP 110/62 (BP Location: Left Arm)   Pulse 75   Temp 98 F (36.7 C) (Oral)   Resp 15   Ht 6' (1.829 m)   Wt 56.7 kg (125 lb)   SpO2 96%   BMI 16.95 kg/m  Pain Scale: 0-10   Pain Score: 6    SpO2: SpO2: 96 % O2 Device:SpO2: 96 % O2 Flow Rate: .O2 Flow Rate (L/min): 2 L/min  IO: Intake/output summary:   Intake/Output Summary (Last 24 hours) at 05/29/2017 1030 Last data filed at 05/29/2017 0815 Gross per 24 hour  Intake 480 ml  Output 1450 ml  Net -970 ml    LBM: Last BM Date: 05/28/17 Baseline Weight: Weight: 56.7 kg (125 lb) Most recent weight: Weight: 56.7 kg (125 lb)     Palliative Assessment/Data:     Time  In: 1710 Time Out:1800  Time Total: 50 Greater than 50%  of this time was spent counseling and coordinating care related to the above assessment and plan.  Signed by: Micheline Rough, MD   Please contact Palliative Medicine Team phone at [phone] for questions and concerns.  For individual provider: See  Shea Evans

## 2017-05-29 NOTE — Progress Notes (Signed)
Daily Progress Note   Patient Name: [name]       Date: 05/29/2017 DOB: December 18, 1960  Age: 57 y.o. MRN#: [id number] Attending Physician: Aline August, MD Primary Care Physician: [name], MD Admit Date: 05/28/2017  Reason for Consultation/Follow-up: Establishing goals of care  Subjective: I met again with [name] today to discuss long term goals of care as well as helping to feel better symptomatically.  See below:  Length of Stay: 1  Current Medications: Scheduled Meds:  . amLODipine  10 mg Oral Daily  . escitalopram  5 mg Oral QHS  . feeding supplement (ENSURE ENLIVE)  237 mL Oral TID BM  . fluconazole  100 mg Oral Daily  . guaiFENesin  600 mg Oral BID  . heparin  5,000 Units Subcutaneous Q8H  . insulin aspart  0-9 Units Subcutaneous TID WC  . ipratropium-albuterol  3 mL Nebulization Q6H WA  . methylPREDNISolone (SOLU-MEDROL) injection  40 mg Intravenous Q8H  . metoprolol tartrate  25 mg Oral BID  . pantoprazole  40 mg Oral BID  . senna-docusate  1 tablet Oral QHS    Continuous Infusions: . sodium chloride      PRN Meds: acetaminophen, albuterol, bisacodyl, guaiFENesin-dextromethorphan, morphine CONCENTRATE, ondansetron, oxyCODONE, polyethylene glycol, sodium chloride flush  Physical Exam         General: Alert, awake, in no acute distress. Thin and lying in bed watching TV.  Mild increase in WOB. HEENT: No bruits, no goiter, no JVD Heart: Regular rate and rhythm. No murmur appreciated. Lungs: Diminished air movement Abdomen: Soft, nontender, nondistended, positive bowel sounds.  Ext: No significant edema Skin: Warm and dry Neuro: Grossly intact, nonfocal.   Vital Signs: BP 110/62 (BP Location: Left Arm)   Pulse 75   Temp 98 F (36.7 C) (Oral)    Resp 15   Ht 6' (1.829 m)   Wt 56.7 kg (125 lb)   SpO2 96%   BMI 16.95 kg/m  SpO2: SpO2: 96 % O2 Device: O2 Device: Nasal Cannula O2 Flow Rate: O2 Flow Rate (L/min): 2 L/min  Intake/output summary:   Intake/Output Summary (Last 24 hours) at 05/29/2017 1300 Last data filed at 05/29/2017 1120 Gross per 24 hour  Intake 480 ml  Output 2075 ml  Net -1595 ml   LBM: Last BM Date:  05/28/17 Baseline Weight: Weight: 56.7 kg (125 lb) Most recent weight: Weight: 56.7 kg (125 lb)       Palliative Assessment/Data:      Patient Active Problem List   Diagnosis Date Noted  . Acute respiratory failure ([place]) 05/28/2017  . Acute respiratory infection 05/28/2017  . Chronic respiratory failure with hypoxia ([place]) 04/01/2017  . Leucocytosis 03/16/2017  . CKD (chronic kidney disease), stage III ([place]) 03/12/2017  . Malnutrition of moderate degree 03/10/2017  . COPD with acute exacerbation ([place]) 03/09/2017  . Anemia 02/07/2017  . Severe malnutrition ([place]) 01/03/2017  . DOE (dyspnea on exertion) 12/30/2016  . Nausea   . Hypervolemia   . HCAP (healthcare-associated pneumonia) 12/19/2016  . Dyspnea and respiratory abnormality 12/19/2016  . Erosive gastropathy 12/18/2016  . Gastritis 12/18/2016  . Hematemesis 12/16/2016  . Intractable nausea and vomiting 12/16/2016  . HTN (hypertension) 10/30/2016  . Chronic obstructive pulmonary disease ([place]) 09/02/2016  . COPD (chronic obstructive pulmonary disease) ([place]) 08/19/2016  . Dehydration 06/04/2016  . Abdominal pain 06/04/2016  . Spine metastasis ([place]) 05/13/2016  . Adenocarcinoma of left lung, stage 4 ([place]) 05/02/2016  . Goals of care, counseling/discussion 05/02/2016  . Encounter for antineoplastic chemotherapy 05/02/2016  . Tobacco abuse 05/30/2011  . Alcohol abuse 05/30/2011  . Seizure ([place]) 05/28/2011    Palliative Care Assessment & Plan   Patient Profile: 57 y.o. male  with past medical history of COPD and stage IV NSCLC, spinal  mets, s/p radiation therapy, currently undergoing chemotherapy, seizure d/o, diastolic dysfunction admitted on 05/28/2017 with COPD exacerbation.  He has continued to have increased SOB with any ambulation.  Palliative consulted for symptom management and goals of care.   Recommendations/Plan: - DNR/DNI but full scope treatment otherwise - He is able to verbalize that he has incurable cancer and that he is not getting chemotherapy currently due to kidney function.  He reports needing to talk to Dr. Earlie Server prior to making any further decisions regarding long term care plan.  He is pleasant during encounter, but evasive when attempting to discuss that continued disease modifying therapy may not be a possibility.  He reports that " everything seems like a secret and Dr. Earlie Server and I will need to figure out what to do next."  I then asked if I could help clarify anything so that he felt more informed of plan, and he reported again that he was going to wait to talk to Dr. Earlie Server prior to considering any changes to his current care plan. - Dyspnea- With his renal impairment, recommend continue oxycodone 56m every 4 hours as needed for pain or SOB.  Could also consider starting low dose long acting medication, such as oxycontin 181mBID as this may help him with his all day SOB and poor functional status.  He declined for me to add this as a trial medication today.  Goals of Care and Additional Recommendations:  Limitations on Scope of Treatment: Full Scope Treatment, DNR/DNI in the event of arrest  Code Status:    Code Status Orders  (From admission, onward)        Start     Ordered   05/28/17 1146  Do not attempt resuscitation (DNR)  Continuous    Question Answer Comment  In the event of cardiac or respiratory ARREST Do not call a "code blue"   In the event of cardiac or respiratory ARREST Do not perform Intubation, CPR, defibrillation or ACLS   In the event of cardiac or respiratory  ARREST  Use medication by any route, position, wound care, and other measures to relive pain and suffering. May use oxygen, suction and manual treatment of airway obstruction as needed for comfort.      05/28/17 1147    Code Status History    Date Active Date Inactive Code Status Order ID Comments User Context   04/10/2017 1148 04/17/2017 1837 Full Code [id number]  Damita Lack, MD ED   03/09/2017 2359 03/19/2017 1931 Full Code [id number]  Cristy Folks, MD Inpatient   02/14/2017 0902 02/16/2017 1455 Full Code [id number]  Florencia Reasons, MD Inpatient   02/10/2017 0031 02/14/2017 0902 DNR [id number]  Norval Morton, MD ED   12/30/2016 2325 01/07/2017 2109 DNR [id number]  Elodia [name]., MD Inpatient   12/17/2016 0934 12/24/2016 1811 DNR [id number]  Thurnell Lose, MD Inpatient   12/16/2016 1649 12/17/2016 0934 Full Code [id number]  Charlynne Cousins, MD Inpatient       Prognosis:  Guarded.  His last chemo was held secondary to renal impairment, and Cr is higher this admit.  I believe that his prognosis, if he does not receive further disease modifying therapy, will be less than 6 months if his disease follows its natural course and he should qualify for hospice if he desires.   Discharge Planning:  Back to SNF.   Care plan was discussed with patient  Thank you for allowing the Palliative Medicine Team to assist in the care of this patient.   Time In: 1230 Time Out: 1315 Total Time 45 Prolonged Time Billed No      Greater than 50%  of this time was spent counseling and coordinating care related to the above assessment and plan.  Micheline Rough, MD  Please contact Palliative Medicine Team phone at [phone] for questions and concerns.

## 2017-05-29 NOTE — Progress Notes (Signed)
Patient ID: [name], male   DOB: 15-Mar-1960, 57 y.o.   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; chronic kidney disease, anemia, COPD, ongoing tobacco use, pleural effusion status post thoracentesis with last admission from 04/10/2017-04/17/2017 for respiratory failure with pleural effusion presented on 05/28/2017 with worsening shortness of breath.  He was admitted with COPD exacerbation and probable CHF exacerbation.  He underwent right-sided thoracentesis on 05/28/2017.  Assessment & Plan:   Active Problems:   Acute respiratory failure (HCC)   Acute respiratory infection  Hypoxia and dyspnea -Probably from COPD exacerbation/CHF/lung cancer -Currently requiring 2 L oxygen via nasal cannula. -Incentive spirometry  COPD exacerbation -Improving.  Decrease Solu-Medrol to 40 mg IV every 8 hours.  Add Dulera.  Continue duo nebs  Acute kidney injury on  chronic kidney disease stage III-IV -Creatinine improving.  Repeat a.m. labs.  Will start gentle hydration for today and hold Lasix. -Renal ultrasound.  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 as an outpatient.  Admitting hospitalist spoke to [name] on phone on 05/28/2017.  Outpatient follow-up with [name].  Last chemotherapy dose was held because of acute kidney injury. -Overall prognosis is guarded to poor.  Palliative care following.  If condition worsens, might benefit from hospice.  Recurrent pleural effusion -Probably malignancy related. -Status post right-sided thoracentesis and removal of 1.2 L fluid on 08/26/275  Chronic diastolic heart failure -Currently does not look fluid overloaded.  Will  discontinue Lasix.  Provide gentle hydration.  Watch out for signs of volume overload. -Check input and output.  Daily weights  Chronic anemia of malignancy/renal disease -Hemoglobin stable.  Monitor   DVT prophylaxis: Heparin Code Status: DNR Family Communication: None at bedside Disposition Plan: Probably back to nursing home in 2 to 3 days  Consultants: Palliative care  Procedures: Right-sided thoracentesis on 05/28/2017  Antimicrobials: None   Subjective: Patient seen and examined at bedside.  He feels slightly better.  Complains of back pain.  Poor historian.  No overnight fever, nausea or vomiting.  Objective: Vitals:   05/29/17 0314 05/29/17 0502 05/29/17 0941 05/29/17 0942  BP:  110/62    Pulse:  75    Resp:  15    Temp:  98 F (36.7 C)    TempSrc:  Oral    SpO2: 100% 100% 96% 96%  Weight:      Height:        Intake/Output Summary (Last 24 hours) at 05/29/2017 1239 Last data filed at 05/29/2017 1120 Gross per 24 hour  Intake 480 ml  Output 2075 ml  Net -1595 ml   Filed Weights   05/28/17 0806  Weight: 56.7 kg (125 lb)    Examination:  General exam: Appears older than stated age.  Poor historian.  No acute distress respiratory system: Bilateral decreased breath sound at bases with some scattered crackles Cardiovascular system: S1 & S2 heard, rate controlled  gastrointestinal system: Abdomen is nondistended, soft and nontender. Normal bowel sounds heard. Central nervous system: Alert and oriented. No focal neurological deficits. Moving extremities Extremities: No cyanosis, clubbing, edema  Skin: No rashes, lesions or ulcers Psychiatry: Flat affect.    Data Reviewed:  I have personally reviewed following labs and imaging studies  CBC: Recent Labs  Lab 05/28/17 0931 05/28/17 1450 05/29/17 0441  WBC 5.3 6.5 5.7  NEUTROABS 4.7  --   --   HGB 8.6* 8.4* 7.7*  HCT 25.8* 25.9* 22.9*  MCV 100.8* 98.9 100.0  PLT 198 231 213   Basic Metabolic  Panel: Recent Labs  Lab 05/28/17 0830 05/28/17 1450 05/29/17 0441  NA 131*  --  134*  K 4.7  --  4.2  CL 89*  --  95*  CO2 26  --  25  GLUCOSE 160*  --  200*  BUN 52*  --  59*  CREATININE 3.71* 3.50* 3.06*  CALCIUM 10.1  --  9.3   GFR: Estimated Creatinine Clearance: 21.4 mL/min (A) (by C-G formula based on SCr of 3.06 mg/dL (H)). Liver Function Tests: Recent Labs  Lab 05/29/17 0441  AST 20  ALT 8*  ALKPHOS 87  BILITOT 0.4  PROT 6.6  ALBUMIN 3.0*   No results for input(s): LIPASE, AMYLASE in the last 168 hours. No results for input(s): AMMONIA in the last 168 hours. Coagulation Profile: No results for input(s): INR, PROTIME in the last 168 hours. Cardiac Enzymes: No results for input(s): CKTOTAL, CKMB, CKMBINDEX, TROPONINI in the last 168 hours. BNP (last 3 results) No results for input(s): PROBNP in the last 8760 hours. HbA1C: No results for input(s): HGBA1C in the last 72 hours. CBG: Recent Labs  Lab 05/29/17 1111  GLUCAP 196*   Lipid Profile: No results for input(s): CHOL, HDL, LDLCALC, TRIG, CHOLHDL, LDLDIRECT in the last 72 hours. Thyroid Function Tests: No results for input(s): TSH, T4TOTAL, FREET4, T3FREE, THYROIDAB in the last 72 hours. Anemia Panel: No results for input(s): VITAMINB12, FOLATE, FERRITIN, TIBC, IRON, RETICCTPCT in the last 72 hours. Sepsis Labs: No results for input(s): PROCALCITON, LATICACIDVEN in the last 168 hours.  Recent Results (from the past 240 hour(s))  Body fluid culture     Status: None (Preliminary result)   Collection Time: 05/28/17 12:29 PM  Result Value Ref Range Status   Specimen Description   Final    PLEURAL RIGHT Performed at [facility] [address] [place], [place] [address]    Special Requests   Final    NONE Performed at [facility], [place], [place] [address], [place], [place] [address]    Gram Stain   Final    ABUNDANT WBC PRESENT,BOTH PMN AND MONONUCLEAR NO ORGANISMS  SEEN    Culture   Final    NO GROWTH < 24 HOURS Performed at [facility] [address] [place], [place] [address]    Report Status PENDING  Incomplete  MRSA PCR Screening     Status: None   Collection Time: 05/28/17  4:29 PM  Result Value Ref Range Status   MRSA by PCR NEGATIVE NEGATIVE Final    Comment:        The GeneXpert MRSA Assay (FDA approved for NASAL specimens only), is one component of a comprehensive MRSA colonization surveillance program. It is not intended to diagnose MRSA infection nor to guide or monitor treatment for MRSA infections. Performed at [facility], [place] [address], [place], [place] [address]          Radiology Studies: Dg Chest 1 View  Result Date: 05/28/2017 CLINICAL DATA:  Status post right thoracentesis. EXAM: CHEST  1 VIEW COMPARISON:  05/28/2017 FINDINGS: Port-A-Cath tip is near the superior cavoatrial junction and  stable. Improved aeration at the right lung base compatible with thoracentesis and decreased pleural fluid. There is persistent blunting at the right lung base compatible with residual pleural fluid or possible scarring. Stable blunting at the left lung base. Negative for a pneumothorax. Heart size is stable and within normal limits. The trachea is midline. IMPRESSION: Decreased amount of right pleural fluid and compatible with recent thoracentesis. Negative for pneumothorax. Electronically Signed   By: Markus Daft M.D.   On: 05/28/2017 13:06   Dg Chest Port 1 View  Result Date: 05/28/2017 CLINICAL DATA:  Lung cancer.  Short of breath EXAM: PORTABLE CHEST 1 VIEW COMPARISON:  05/20/2017 FINDINGS: Port in the anterior chest wall with tip in distal SVC. Normal cardiac silhouette. Interval increase in RIGHT pleural effusion. No pulmonary edema. No pneumothorax. IMPRESSION: Significant increase in RIGHT pleural effusion which is moderate in volume. Electronically Signed   By: Suzy Bouchard M.D.   On: 05/28/2017  08:43   Dg Chest Port 1v Same Day  Result Date: 05/28/2017 CLINICAL DATA:  Shortness of breath.  Pleural effusion. EXAM: PORTABLE CHEST 1 VIEW COMPARISON:  Radiograph May 28, 2017. FINDINGS: The heart size and mediastinal contours are within normal limits. No pneumothorax or pleural effusion is noted. Right internal jugular Port-A-Cath is noted which is unchanged in position. Right lung is clear. Emphysematous disease is noted in the left upper lobe. The visualized skeletal structures are unremarkable. IMPRESSION: No acute abnormality seen in the chest. Emphysema (ICD10-J43.9). Electronically Signed   By: Marijo Conception, M.D.   On: 05/28/2017 14:33   US Thoracentesis Asp Pleural Space W/img Guide  Result Date: 05/28/2017 INDICATION: Patient with history of metastatic lung cancer, dyspnea, recurrent right pleural effusion. Request made for diagnostic and therapeutic right thoracentesis. EXAM: ULTRASOUND GUIDED DIAGNOSTIC AND THERAPEUTIC RIGHT THORACENTESIS MEDICATIONS: None COMPLICATIONS: None immediate. PROCEDURE: An ultrasound guided thoracentesis was thoroughly discussed with the patient and questions answered. The benefits, risks, alternatives and complications were also discussed. The patient understands and wishes to proceed with the procedure. Written consent was obtained. Ultrasound was performed to localize and mark an adequate pocket of fluid in the right chest. The area was then prepped and draped in the normal sterile fashion. 1% Lidocaine was used for local anesthesia. Under ultrasound guidance a 6 Fr Safe-T-Centesis catheter was introduced. Thoracentesis was performed. The catheter was removed and a dressing applied. FINDINGS: A total of approximately 1.2 liters of bloody fluid was removed. Samples were sent to the laboratory as requested by the clinical team. IMPRESSION: Successful ultrasound guided diagnostic and therapeutic right thoracentesis yielding 1.2 liters of pleural fluid. Follow-up  chest x-ray revealed no pneumothorax. Read by: Rowe Robert, PA-C Electronically Signed   By: Jacqulynn Cadet M.D.   On: 05/28/2017 13:17        Scheduled Meds: . amLODipine  10 mg Oral Daily  . escitalopram  5 mg Oral QHS  . feeding supplement (ENSURE ENLIVE)  237 mL Oral TID BM  . fluconazole  100 mg Oral Daily  . guaiFENesin  600 mg Oral BID  . heparin  5,000 Units Subcutaneous Q8H  . insulin aspart  0-9 Units Subcutaneous TID WC  . ipratropium-albuterol  3 mL Nebulization Q6H WA  . methylPREDNISolone (SOLU-MEDROL) injection  60 mg Intravenous Q6H  . metoprolol tartrate  25 mg Oral BID  . pantoprazole  40 mg Oral BID  . senna-docusate  1 tablet Oral QHS   Continuous Infusions:   LOS: 1 day  Aline August, MD [facility] Pager [phone]  If 7PM-7AM, please contact night-coverage [email or web address] Password [facility] 05/29/2017, 12:39 PM

## 2017-05-29 NOTE — Evaluation (Signed)
Physical Therapy Evaluation Patient Details Name: [name] MRN: [id number] DOB: [birth year] Today's Date: 05/29/2017   History of Present Illness  Pt admitted 2* SOB and with hx of COPD, Lung CA with spinal mets and ETOH abuse  Clinical Impression  Pt admitted as above and presenting with functional mobility limitations 2* SOB with exertion, generalized weakness and mild ambulatory balance deficits.  Pt should progress to return to previous living arrangement and would benefit from rollator for home use.    Follow Up Recommendations No PT follow up    Equipment Recommendations  Other (comment)(4 wheeled rollator)    Recommendations for Other Services       Precautions / Restrictions Precautions Precautions: Fall Restrictions Weight Bearing Restrictions: No      Mobility  Bed Mobility Overal bed mobility: Modified Independent             General bed mobility comments: Pt unassisted to sitting EOB  Transfers Overall transfer level: Needs assistance Equipment used: 4-wheeled walker Transfers: Sit to/from Stand Sit to Stand: Min guard         General transfer comment: steady assist with cues for use of UEs to self assist  Ambulation/Gait Ambulation/Gait assistance: Min assist;Min guard Ambulation Distance (Feet): 400 Feet Assistive device: 4-wheeled walker Gait Pattern/deviations: Step-through pattern;Decreased step length - right;Decreased step length - left;Shuffle;Trunk flexed Gait velocity: decr   General Gait Details: cues for posture and position from RW.  Pt required one seated rest break to complete task  Stairs            Wheelchair Mobility    Modified Rankin (Stroke Patients Only)       Balance Overall balance assessment: Needs assistance Sitting-balance support: No upper extremity supported;Feet supported Sitting balance-Leahy Scale: Good     Standing balance support: No upper extremity supported Standing balance-Leahy Scale:  Fair                               Pertinent Vitals/Pain Pain Assessment: No/denies pain    Home Living Family/patient expects to be discharged to:: Other (Comment)                 Additional Comments: Pt plans return to [facility]    Prior Function Level of Independence: Independent         Comments: Feels unsteady and wants a rollator     Hand Dominance        Extremity/Trunk Assessment   Upper Extremity Assessment Upper Extremity Assessment: Generalized weakness    Lower Extremity Assessment Lower Extremity Assessment: Generalized weakness       Communication   Communication: No difficulties  Cognition Arousal/Alertness: Awake/alert Behavior During Therapy: WFL for tasks assessed/performed Overall Cognitive Status: Within Functional Limits for tasks assessed                                        General Comments      Exercises     Assessment/Plan    PT Assessment Patient needs continued PT services  PT Problem List Decreased activity tolerance;Decreased balance;Decreased mobility;Decreased knowledge of use of DME       PT Treatment Interventions DME instruction;Gait training;Functional mobility training;Therapeutic activities;Therapeutic exercise;Patient/family education    PT Goals (Current goals can be found in the Care Plan section)  Acute Rehab PT Goals  Patient Stated Goal: Regain IND and return to previous living arrangement PT Goal Formulation: With patient Time For Goal Achievement: 06/12/17 Potential to Achieve Goals: Good    Frequency Min 3X/week   Barriers to discharge        Co-evaluation               AM-PAC PT "6 Clicks" Daily Activity  Outcome Measure Difficulty turning over in bed (including adjusting bedclothes, sheets and blankets)?: A Little Difficulty moving from lying on back to sitting on the side of the bed? : A Little Difficulty sitting down on and  standing up from a chair with arms (e.g., wheelchair, bedside commode, etc,.)?: A Lot Help needed moving to and from a bed to chair (including a wheelchair)?: A Little Help needed walking in hospital room?: A Little Help needed climbing 3-5 steps with a railing? : A Little 6 Click Score: 17    End of Session Equipment Utilized During Treatment: Gait belt;Oxygen Activity Tolerance: Patient tolerated treatment well Patient left: in chair;with call bell/phone within reach;with chair alarm set Nurse Communication: Mobility status PT Visit Diagnosis: Unsteadiness on feet (R26.81)    Time: 0569-7948 PT Time Calculation (min) (ACUTE ONLY): 28 min   Charges:   PT Evaluation $PT Eval Low Complexity: 1 Low PT Treatments $Gait Training: 8-22 mins   PT G Codes:        Pg [phone]   [name] 05/29/2017, 5:05 PM

## 2017-05-29 NOTE — Progress Notes (Signed)
Pt. Reported SOB after standing to urinate. Pt. Assessed and found to have expiratory wheezing, and labored breathing at rest with 7/10 pain on back where puncture site where procedure was done. O2 sats 100% at 2.5 L. Sitting on side of the bed. Respiratory called for breathing treatment and 10 mg Oxy IR given. Will continue to monitor.

## 2017-05-30 LAB — CBC WITH DIFFERENTIAL/PLATELET
BASOS ABS: 0 10*3/uL (ref 0.0–0.1)
Basophils Relative: 0 %
Eosinophils Absolute: 0 10*3/uL (ref 0.0–0.7)
Eosinophils Relative: 0 %
HEMATOCRIT: 22.3 % — AB (ref 39.0–52.0)
HEMOGLOBIN: 7.5 g/dL — AB (ref 13.0–17.0)
LYMPHS PCT: 3 %
Lymphs Abs: 0.3 10*3/uL — ABNORMAL LOW (ref 0.7–4.0)
MCH: 33.8 pg (ref 26.0–34.0)
MCHC: 33.6 g/dL (ref 30.0–36.0)
MCV: 100.5 fL — ABNORMAL HIGH (ref 78.0–100.0)
MONO ABS: 0.4 10*3/uL (ref 0.1–1.0)
Monocytes Relative: 4 %
NEUTROS ABS: 9.8 10*3/uL — AB (ref 1.7–7.7)
NEUTROS PCT: 93 %
Platelets: 199 10*3/uL (ref 150–400)
RBC: 2.22 MIL/uL — AB (ref 4.22–5.81)
RDW: 16.7 % — AB (ref 11.5–15.5)
WBC: 10.4 10*3/uL (ref 4.0–10.5)

## 2017-05-30 LAB — BASIC METABOLIC PANEL
ANION GAP: 10 (ref 5–15)
BUN: 49 mg/dL — ABNORMAL HIGH (ref 6–20)
CHLORIDE: 100 mmol/L — AB (ref 101–111)
CO2: 28 mmol/L (ref 22–32)
Calcium: 9.4 mg/dL (ref 8.9–10.3)
Creatinine, Ser: 1.97 mg/dL — ABNORMAL HIGH (ref 0.61–1.24)
GFR calc non Af Amer: 36 mL/min — ABNORMAL LOW (ref >60)
GFR, EST AFRICAN AMERICAN: 42 mL/min — AB (ref >60)
Glucose, Bld: 139 mg/dL — ABNORMAL HIGH (ref 65–99)
POTASSIUM: 4.5 mmol/L (ref 3.5–5.1)
Sodium: 138 mmol/L (ref 135–145)

## 2017-05-30 LAB — GLUCOSE, CAPILLARY
GLUCOSE-CAPILLARY: 233 mg/dL — AB (ref 65–99)
Glucose-Capillary: 140 mg/dL — ABNORMAL HIGH (ref 65–99)
Glucose-Capillary: 191 mg/dL — ABNORMAL HIGH (ref 65–99)

## 2017-05-30 MED ORDER — MOMETASONE FURO-FORMOTEROL FUM 100-5 MCG/ACT IN AERO
2.0000 | INHALATION_SPRAY | Freq: Two times a day (BID) | RESPIRATORY_TRACT | Status: DC
  Administered 2017-05-30 – 2017-05-31 (×2): 2 via RESPIRATORY_TRACT
  Filled 2017-05-30: qty 8.8

## 2017-05-30 NOTE — Progress Notes (Signed)
Patient ID: [name], male   DOB: [birth year], 57 y.o.   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; chronic kidney disease, anemia, COPD, ongoing tobacco use, pleural effusion status post thoracentesis with last admission from 04/10/2017-04/17/2017 for respiratory failure with pleural effusion presented on 05/28/2017 with worsening shortness of breath.  He was admitted with COPD exacerbation and probable CHF exacerbation.  He underwent right-sided thoracentesis on 05/28/2017.  Assessment & Plan:   Active Problems:   Acute respiratory failure (HCC)   Acute respiratory infection  Hypoxia and dyspnea -Probably from COPD exacerbation/CHF/lung cancer -Currently still requiring 2 L oxygen via nasal cannula.  Wean as able.  Might need oxygen arrangement upon discharge. -Incentive spirometry  COPD exacerbation -Improving.  Decrease Solu-Medrol to 40 mg IV every 12 hours.  Add Dulera.  Probable switch to oral prednisone tomorrow.  Continue duo nebs  Acute kidney injury on  chronic kidney disease stage III-IV -Creatinine improving.  Repeat a.m. labs.  Continue gentle hydration and hold Lasix. -Renal ultrasound showed no hydronephrosis.  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 as an outpatient.  Admitting hospitalist spoke to [name] on phone on 05/28/2017.  Outpatient follow-up with [name].  Last chemotherapy dose was held because of acute kidney injury. -Overall prognosis is guarded to poor.  Palliative care following.  If condition worsens, might benefit from hospice.  For now, patient wants to speak to [name] before making any decisions.  Tobacco  abuse -Ongoing.  Counseled regarding cessation.  Recurrent pleural effusion -Probably malignancy related. -Status post right-sided thoracentesis and removal of 1.2 L fluid on 05/28/2017 -Outpatient follow-up  Chronic diastolic heart failure -Currently does not look fluid overloaded.  Will keep Lasix on hold. -Check input and output.  Daily weights  Chronic anemia of malignancy/renal disease -Hemoglobin stable.  Monitor   DVT prophylaxis: Heparin Code Status: DNR Family Communication: None at bedside Disposition Plan: Probably back to nursing home in 1 to 2 days  Consultants: Palliative care  Procedures: Right-sided thoracentesis on 05/28/2017  Antimicrobials: None   Subjective: Patient seen and examined at bedside.  He is a poor historian but feels slightly better.  No overnight fever, vomiting, worsening shortness of breath.  Objective: Vitals:   05/29/17 2121 05/30/17 0500 05/30/17 0908 05/30/17 1010  BP: 112/68   (!) 151/100  Pulse: 79   90  Resp: 19   20  Temp: 98.2 F (36.8 C)   97.9 F (36.6 C)  TempSrc: Oral   Oral  SpO2: 96%  96% 99%  Weight:  54.7 kg (120 lb 9.5 oz)    Height:        Intake/Output Summary (Last 24 hours) at 05/30/2017 1102 Last data filed at 05/30/2017 1000 Gross per 24 hour  Intake 2232.5 ml  Output 1425 ml  Net 807.5 ml   Filed Weights   05/28/17 0806 05/30/17 0500  Weight: 56.7 kg (125 lb) 54.7 kg (120 lb 9.5 oz)    Examination:  General exam: No distress.  Poor historian.   Respiratory system: Bilateral decreased breath sound at bases with some scattered crackles, not much wheezing Cardiovascular system: Rate controlled, S1-S2 heard  gastrointestinal system: Abdomen is nondistended, soft and nontender. Normal bowel sounds heard. Extremities: No cyanosis.  No edema   Data Reviewed: I have personally reviewed following labs and imaging studies  CBC: Recent Labs  Lab 05/28/17 0931 05/28/17 1450 05/29/17 0441 05/30/17 0759   WBC 5.3 6.5 5.7 10.4  NEUTROABS 4.7  --   --  9.8*  HGB 8.6* 8.4* 7.7* 7.5*  HCT 25.8* 25.9* 22.9* 22.3*  MCV 100.8* 98.9 100.0 100.5*  PLT 198 231 199 161   Basic Metabolic Panel: Recent Labs  Lab 05/28/17 0830 05/28/17 1450 05/29/17 0441 05/30/17 0759  NA 131*  --  134* 138  K 4.7  --  4.2 4.5  CL 89*  --  95* 100*  CO2 26  --  25 28  GLUCOSE 160*  --  200* 139*  BUN 52*  --  59* 49*  CREATININE 3.71* 3.50* 3.06* 1.97*  CALCIUM 10.1  --  9.3 9.4   GFR: Estimated Creatinine Clearance: 32 mL/min (A) (by C-G formula based on SCr of 1.97 mg/dL (H)). Liver Function Tests: Recent Labs  Lab 05/29/17 0441  AST 20  ALT 8*  ALKPHOS 87  BILITOT 0.4  PROT 6.6  ALBUMIN 3.0*   No results for input(s): LIPASE, AMYLASE in the last 168 hours. No results for input(s): AMMONIA in the last 168 hours. Coagulation Profile: No results for input(s): INR, PROTIME in the last 168 hours. Cardiac Enzymes: No results for input(s): CKTOTAL, CKMB, CKMBINDEX, TROPONINI in the last 168 hours. BNP (last 3 results) No results for input(s): PROBNP in the last 8760 hours. HbA1C: No results for input(s): HGBA1C in the last 72 hours. CBG: Recent Labs  Lab 05/29/17 1111 05/29/17 1617 05/29/17 2119 05/30/17 0759  GLUCAP 196* 141* 199* 140*   Lipid Profile: No results for input(s): CHOL, HDL, LDLCALC, TRIG, CHOLHDL, LDLDIRECT in the last 72 hours. Thyroid Function Tests: No results for input(s): TSH, T4TOTAL, FREET4, T3FREE, THYROIDAB in the last 72 hours. Anemia Panel: No results for input(s): VITAMINB12, FOLATE, FERRITIN, TIBC, IRON, RETICCTPCT in the last 72 hours. Sepsis Labs: No results for input(s): PROCALCITON, LATICACIDVEN in the last 168 hours.  Recent Results (from the past 240 hour(s))  Body fluid culture     Status: None (Preliminary result)   Collection Time: 05/28/17 12:29 PM  Result Value Ref Range Status   Specimen Description   Final    PLEURAL RIGHT Performed at  [facility] [address], [place], [place] [address]    Special Requests   Final    NONE Performed at [facility], [place] [address], [place], [place] [address]    Gram Stain   Final    ABUNDANT WBC PRESENT,BOTH PMN AND MONONUCLEAR NO ORGANISMS SEEN    Culture   Final    NO GROWTH 2 DAYS Performed at [facility], [place] [address], [place], [place] [address]    Report Status PENDING  Incomplete  MRSA PCR Screening     Status: None   Collection Time: 05/28/17  4:29 PM  Result Value Ref Range Status   MRSA by PCR NEGATIVE NEGATIVE Final    Comment:        The GeneXpert MRSA Assay (FDA approved for NASAL specimens only), is one component of a comprehensive MRSA colonization surveillance program. It is not intended to diagnose MRSA infection nor to guide or monitor treatment for MRSA infections. Performed at [facility], [address] [place], [place] [address]          Radiology Studies: Dg Chest 1 View  Result Date: 05/28/2017 CLINICAL DATA:  Status post right thoracentesis. EXAM: CHEST  1 VIEW COMPARISON:  05/28/2017 FINDINGS: Port-A-Cath tip is near the superior cavoatrial junction and stable. Improved aeration at the right lung base compatible with thoracentesis and decreased pleural fluid. There is persistent blunting at the right lung base compatible with residual pleural fluid or possible scarring. Stable blunting at the left lung base. Negative for a pneumothorax. Heart size is stable and within normal limits. The trachea is midline. IMPRESSION: Decreased amount of right pleural fluid and compatible with recent thoracentesis. Negative for pneumothorax. Electronically Signed   By: Markus Daft M.D.   On: 05/28/2017 13:06   US Renal  Result Date: 05/29/2017 CLINICAL DATA:  Renal failure. History of hypertension and lung cancer. EXAM: RENAL / URINARY TRACT ULTRASOUND COMPLETE COMPARISON:  None. FINDINGS: Right  Kidney: Length: 9.8 cm. Renal cortex is diffusely echogenic. No mass or hydronephrosis visualized. Left Kidney: Length: 10.3 cm. Echogenicity within normal limits. No mass or hydronephrosis visualized. Bladder: Bladder is decompressed status post voiding. Bilateral pleural effusions. IMPRESSION: 1. RIGHT kidney appears echogenic suggesting chronic medical renal disease. Echogenicity of the LEFT kidney is within normal limits. No hydronephrosis bilaterally. 2. Bladder is decompressed. 3. Bilateral pleural effusions. Electronically Signed   By: Franki Cabot M.D.   On: 05/29/2017 14:11   Dg Chest Port 1v Same Day  Result Date: 05/28/2017 CLINICAL DATA:  Shortness of breath.  Pleural effusion. EXAM: PORTABLE CHEST 1 VIEW COMPARISON:  Radiograph May 28, 2017. FINDINGS: The heart size and mediastinal contours are within normal limits. No pneumothorax or pleural effusion is noted. Right internal jugular Port-A-Cath is noted which is unchanged in position. Right lung is clear. Emphysematous disease is noted in the left upper lobe. The visualized skeletal structures are unremarkable. IMPRESSION: No acute abnormality seen in the chest. Emphysema (ICD10-J43.9). Electronically Signed   By: Marijo Conception, M.D.   On: 05/28/2017 14:33   US Thoracentesis Asp Pleural Space W/img Guide  Result Date: 05/28/2017 INDICATION: Patient with history of metastatic lung cancer, dyspnea, recurrent right pleural effusion. Request made for diagnostic and therapeutic right thoracentesis. EXAM: ULTRASOUND GUIDED DIAGNOSTIC AND THERAPEUTIC RIGHT THORACENTESIS MEDICATIONS: None COMPLICATIONS: None immediate. PROCEDURE: An ultrasound guided thoracentesis was thoroughly discussed with the patient and questions answered. The benefits, risks, alternatives and complications were also discussed. The patient understands and wishes to proceed with the procedure. Written consent was obtained. Ultrasound was performed to localize and mark an adequate  pocket of fluid in the right chest. The area was then prepped and draped in the normal sterile fashion. 1% Lidocaine was used for local anesthesia. Under ultrasound guidance a 6 Fr Safe-T-Centesis catheter was introduced. Thoracentesis was performed. The catheter was removed and a dressing applied. FINDINGS: A total of approximately 1.2 liters of bloody fluid was removed. Samples were sent to the laboratory as requested by the clinical team. IMPRESSION: Successful ultrasound guided diagnostic and therapeutic right thoracentesis yielding 1.2 liters of pleural fluid. Follow-up chest x-ray revealed no pneumothorax. Read by: Rowe Robert, PA-C Electronically Signed   By: Jacqulynn Cadet M.D.   On: 05/28/2017 13:17        Scheduled Meds: . amLODipine  10 mg Oral Daily  . escitalopram  5 mg Oral QHS  . feeding supplement (ENSURE ENLIVE)  237 mL Oral TID BM  . fluconazole  100 mg Oral  Daily  . guaiFENesin  600 mg Oral BID  . heparin  5,000 Units Subcutaneous Q8H  . insulin aspart  0-9 Units Subcutaneous TID WC  . ipratropium-albuterol  3 mL Nebulization Q6H WA  . methylPREDNISolone (SOLU-MEDROL) injection  40 mg Intravenous Q8H  . metoprolol tartrate  25 mg Oral BID  . pantoprazole  40 mg Oral BID  . senna-docusate  1 tablet Oral QHS   Continuous Infusions: . sodium chloride 75 mL/hr at 05/30/17 0300     LOS: 2 days        Aline August, MD [facility] Pager [phone]  If 7PM-7AM, please contact night-coverage [email or web address] Password [email or web address] 05/30/2017, 11:02 AM

## 2017-05-30 NOTE — Progress Notes (Signed)
Pt gone for a procedure will continue to follow.

## 2017-05-30 NOTE — Progress Notes (Signed)
[facility]  Patient Status: Active (receiving services up to time of hospitalization)  [facility] is providing the following services: PT and OT  If patient discharges after hours, please call [phone].   Edwinna Areola 05/30/2017, 1:16 PM

## 2017-05-30 NOTE — Progress Notes (Signed)
PT Cancellation Note  Patient Details Name: [name] MRN: [id number] DOB: 1960/02/02   Cancelled Treatment:      Attempted to see twice.    1. Pt was hungry/angry and stated "maybe later"    2.  Pt ate but wanted to let food digest.  Pt has been evaluated and amb a great distance with rollator.  Encourage OOB.   Nathanial Rancher 05/30/2017, 3:35 PM

## 2017-05-31 DIAGNOSIS — C3492 Malignant neoplasm of unspecified part of left bronchus or lung: Secondary | ICD-10-CM

## 2017-05-31 LAB — BASIC METABOLIC PANEL
ANION GAP: 10 (ref 5–15)
BUN: 42 mg/dL — AB (ref 6–20)
CALCIUM: 9.5 mg/dL (ref 8.9–10.3)
CO2: 28 mmol/L (ref 22–32)
Chloride: 102 mmol/L (ref 101–111)
Creatinine, Ser: 2 mg/dL — ABNORMAL HIGH (ref 0.61–1.24)
GFR calc Af Amer: 41 mL/min — ABNORMAL LOW (ref >60)
GFR calc non Af Amer: 35 mL/min — ABNORMAL LOW (ref >60)
GLUCOSE: 153 mg/dL — AB (ref 65–99)
Potassium: 4.4 mmol/L (ref 3.5–5.1)
Sodium: 140 mmol/L (ref 135–145)

## 2017-05-31 LAB — CBC WITH DIFFERENTIAL/PLATELET
BASOS ABS: 0 10*3/uL (ref 0.0–0.1)
Basophils Relative: 0 %
Eosinophils Absolute: 0 10*3/uL (ref 0.0–0.7)
Eosinophils Relative: 0 %
HEMATOCRIT: 23.4 % — AB (ref 39.0–52.0)
Hemoglobin: 7.6 g/dL — ABNORMAL LOW (ref 13.0–17.0)
Lymphocytes Relative: 2 %
Lymphs Abs: 0.2 10*3/uL — ABNORMAL LOW (ref 0.7–4.0)
MCH: 33 pg (ref 26.0–34.0)
MCHC: 32.5 g/dL (ref 30.0–36.0)
MCV: 101.7 fL — AB (ref 78.0–100.0)
MONO ABS: 0.3 10*3/uL (ref 0.1–1.0)
MONOS PCT: 3 %
NEUTROS ABS: 9.5 10*3/uL — AB (ref 1.7–7.7)
Neutrophils Relative %: 95 %
Platelets: 225 10*3/uL (ref 150–400)
RBC: 2.3 MIL/uL — ABNORMAL LOW (ref 4.22–5.81)
RDW: 17.1 % — AB (ref 11.5–15.5)
WBC: 10 10*3/uL (ref 4.0–10.5)

## 2017-05-31 LAB — GLUCOSE, CAPILLARY
GLUCOSE-CAPILLARY: 205 mg/dL — AB (ref 65–99)
Glucose-Capillary: 177 mg/dL — ABNORMAL HIGH (ref 65–99)

## 2017-05-31 LAB — BODY FLUID CULTURE: CULTURE: NO GROWTH

## 2017-05-31 LAB — MAGNESIUM: Magnesium: 1.9 mg/dL (ref 1.7–2.4)

## 2017-05-31 MED ORDER — OXYCODONE HCL 10 MG PO TABS: 10.0000 mg | ORAL_TABLET | ORAL | 0 refills | Status: DC | PRN

## 2017-05-31 MED ORDER — IPRATROPIUM-ALBUTEROL 20-100 MCG/ACT IN AERS: 1.0000 | INHALATION_SPRAY | Freq: Four times a day (QID) | RESPIRATORY_TRACT | 0 refills | Status: DC

## 2017-05-31 MED ORDER — FLUTICASONE-UMECLIDIN-VILANT 100-62.5-25 MCG/INH IN AEPB: 1.0000 | INHALATION_SPRAY | Freq: Every day | RESPIRATORY_TRACT | 0 refills | Status: AC

## 2017-05-31 MED ORDER — GUAIFENESIN ER 600 MG PO TB12: 600.0000 mg | ORAL_TABLET | Freq: Two times a day (BID) | ORAL | 0 refills | Status: DC

## 2017-05-31 MED ORDER — HEPARIN SOD (PORK) LOCK FLUSH 100 UNIT/ML IV SOLN
500.0000 [IU] | INTRAVENOUS | Status: AC | PRN
  Administered 2017-05-31: 500 [IU]

## 2017-05-31 MED ORDER — FUROSEMIDE 40 MG PO TABS: 20.0000 mg | ORAL_TABLET | Freq: Every day | ORAL | Status: DC

## 2017-05-31 MED ORDER — PREDNISONE 20 MG PO TABS: 40.0000 mg | ORAL_TABLET | Freq: Every day | ORAL | 0 refills | Status: DC

## 2017-05-31 MED ORDER — GUAIFENESIN-DM 100-10 MG/5ML PO SYRP: 5.0000 mL | ORAL_SOLUTION | ORAL | 0 refills | Status: DC | PRN

## 2017-05-31 NOTE — Care Management (Addendum)
Because patient is going to SNF, rollator will not be provided by [facility]  SNF will provide needed DME and make arrangements if patient goes home.

## 2017-05-31 NOTE — NC FL2 (Signed)
[place] LEVEL OF CARE SCREENING TOOL     IDENTIFICATION  Patient Name: [name] Birthdate: May 09, 1960 Sex: male Admission Date (Current Location): 05/28/2017  [facility] and [place] Number:  Herbalist and Address:  [facility], [place] [address] [place], [place] [address]      Provider Number: [id number]  Attending Physician Name and Address:  Aline August, MD  Relative Name and Phone Number:       Current Level of Care: Hospital Recommended Level of Care: [place] Prior Approval Number:    Date Approved/Denied:   PASRR Number:    Discharge Plan: Other (Comment)([facility])    Current Diagnoses: Patient Active Problem List   Diagnosis Date Noted  . Acute respiratory failure ([place]) 05/28/2017  . Acute respiratory infection 05/28/2017  . Chronic respiratory failure with hypoxia ([place]) 04/01/2017  . Leucocytosis 03/16/2017  . CKD (chronic kidney disease), stage III ([place]) 03/12/2017  . Malnutrition of moderate degree 03/10/2017  . COPD with acute exacerbation ([place]) 03/09/2017  . Anemia 02/07/2017  . Severe malnutrition ([place]) 01/03/2017  . DOE (dyspnea on exertion) 12/30/2016  . Nausea   . Hypervolemia   . HCAP (healthcare-associated pneumonia) 12/19/2016  . Dyspnea and respiratory abnormality 12/19/2016  . Erosive gastropathy 12/18/2016  . Gastritis 12/18/2016  . Hematemesis 12/16/2016  . Intractable nausea and vomiting 12/16/2016  . HTN (hypertension) 10/30/2016  . Chronic obstructive pulmonary disease ([place]) 09/02/2016  . COPD (chronic obstructive pulmonary disease) ([place]) 08/19/2016  . Dehydration 06/04/2016  . Abdominal pain 06/04/2016  . Spine metastasis ([place]) 05/13/2016  . Adenocarcinoma of left lung, stage 4 ([place]) 05/02/2016  . Goals of care, counseling/discussion 05/02/2016  . Encounter for antineoplastic chemotherapy 05/02/2016  . Tobacco abuse 05/30/2011  . Alcohol abuse 05/30/2011   . Seizure ([place]) 05/28/2011    Orientation RESPIRATION BLADDER Height & Weight     Self, Time, Situation, Place  O2(2.5L) Continent Weight: 124 lb 1.9 oz (56.3 kg) Height:  6' (182.9 cm)  BEHAVIORAL SYMPTOMS/MOOD NEUROLOGICAL BOWEL NUTRITION STATUS      Continent    AMBULATORY STATUS COMMUNICATION OF NEEDS Skin   Limited Assist Verbally                         Personal Care Assistance Level of Assistance  Bathing, Feeding, Dressing Bathing Assistance: Limited assistance Feeding assistance: Independent Dressing Assistance: Limited assistance     Functional Limitations Info  Sight, Hearing, Speech Sight Info: Adequate Hearing Info: Adequate Speech Info: Adequate    SPECIAL CARE FACTORS FREQUENCY  PT (By licensed PT), OT (By licensed OT)     PT Frequency: 2xwk              Contractures Contractures Info: Not present    Additional Factors Info  Code Status, Allergies Code Status Info: DNR Allergies Info: No Known Allergies           Current Medications (05/31/2017):  This is the current hospital active medication list Current Facility-Administered Medications  Medication Dose Route Frequency Provider Last Rate Last Dose  . acetaminophen (TYLENOL) tablet 500 mg  500 mg Oral BID PRN Nita Sells, MD      . albuterol (PROVENTIL) (2.5 MG/3ML) 0.083% nebulizer solution 2.5 mg  2.5 mg Nebulization Q2H PRN Nita Sells, MD   2.5 mg at 05/30/17 1850  . amLODipine (NORVASC) tablet 10 mg  10 mg Oral Daily Nita Sells, MD  10 mg at 05/31/17 0909  . bisacodyl (DULCOLAX) suppository 10 mg  10 mg Rectal Daily PRN Nita Sells, MD      . escitalopram (LEXAPRO) tablet 5 mg  5 mg Oral QHS Nita Sells, MD   5 mg at 05/30/17 2138  . feeding supplement (ENSURE ENLIVE) (ENSURE ENLIVE) liquid 237 mL  237 mL Oral TID BM Nita Sells, MD   237 mL at 05/31/17 0909  . fluconazole (DIFLUCAN) tablet 100 mg  100 mg Oral Daily Nita Sells, MD   100 mg at 05/31/17 0909  . guaiFENesin (MUCINEX) 12 hr tablet 600 mg  600 mg Oral BID Nita Sells, MD   600 mg at 05/31/17 0909  . guaiFENesin-dextromethorphan (ROBITUSSIN DM) 100-10 MG/5ML syrup 5 mL  5 mL Oral Q4H PRN Nita Sells, MD   5 mL at 05/31/17 0314  . heparin injection 5,000 Units  5,000 Units Subcutaneous Q8H Samtani, Jai-Gurmukh, MD      . insulin aspart (novoLOG) injection 0-9 Units  0-9 Units Subcutaneous TID WC Aline August, MD   3 Units at 05/31/17 0910  . ipratropium-albuterol (DUONEB) 0.5-2.5 (3) MG/3ML nebulizer solution 3 mL  3 mL Nebulization Q6H WA Nita Sells, MD   3 mL at 05/31/17 0907  . methylPREDNISolone sodium succinate (SOLU-MEDROL) 40 mg/mL injection 40 mg  40 mg Intravenous Q8H Alekh, Kshitiz, MD   40 mg at 05/31/17 0541  . metoprolol tartrate (LOPRESSOR) tablet 25 mg  25 mg Oral BID Arby Barrette A, NP   25 mg at 05/31/17 0909  . mometasone-formoterol (DULERA) 100-5 MCG/ACT inhaler 2 puff  2 puff Inhalation BID Aline August, MD   2 puff at 05/31/17 1040  . morphine CONCENTRATE 10 MG/0.5ML oral solution 5 mg  5 mg Oral Q4H PRN Nita Sells, MD      . ondansetron (ZOFRAN-ODT) disintegrating tablet 4 mg  4 mg Oral Q8H PRN Nita Sells, MD   4 mg at 05/31/17 0314  . oxyCODONE (Oxy IR/ROXICODONE) immediate release tablet 10 mg  10 mg Oral Q4H PRN Nita Sells, MD   10 mg at 05/31/17 0541  . pantoprazole (PROTONIX) EC tablet 40 mg  40 mg Oral BID Nita Sells, MD   40 mg at 05/31/17 0909  . polyethylene glycol (MIRALAX / GLYCOLAX) packet 17 g  17 g Oral Daily PRN Nita Sells, MD      . senna-docusate (Senokot-S) tablet 1 tablet  1 tablet Oral QHS Nita Sells, MD   1 tablet at 05/30/17 2138  . sodium chloride flush (NS) 0.9 % injection 10-40 mL  10-40 mL Intracatheter PRN Nita Sells, MD         Discharge Medications: Please see discharge summary for a list  of discharge medications.  Relevant Imaging Results:  Relevant Lab Results:   Additional Information SS#: [id number]  Wende Neighbors, LCSW

## 2017-05-31 NOTE — Discharge Summary (Signed)
Physician Discharge Summary  [name] FYB:[id number] DOB: 15-Mar-1960 DOA: 05/28/2017  PCP: [name], MD  Admit date: 05/28/2017 Discharge date: 05/31/2017  Admitted From: Assisted living facility  disposition: Assisted living facility  Recommendations for Outpatient Follow-up:  1. Follow up with PCP in 1 week with repeat CBC/BMP 2. Follow-up with oncology/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; chronic kidney disease, anemia, COPD, ongoing tobacco use, pleural effusion status post thoracentesis with last admission from 04/10/2017-04/17/2017 for respiratory failure with pleural effusion presented on 05/28/2017 with worsening shortness of breath.  He was admitted with COPD exacerbation and probable CHF exacerbation.  He underwent right-sided thoracentesis on 05/28/2017.  During the hospitalization, his condition discharged in via nasal cannula at 2 L/min.  Outpatient follow-up with oncology at earliest convenience    Discharge Diagnoses:  Active Problems:   Acute respiratory failure (HCC)   Acute respiratory infection  Hypoxia and dyspnea -Probably from COPD exacerbation/CHF/lung cancer -Currently still requiring 2 L oxygen via nasal cannula.   -Discharge on the same  COPD exacerbation -Improving.  Treated with intravenous salmeterol.  Switch to oral prednisone 40 mill grams daily for 7 days.  Continue Trelegy Ellipta and Combivent.  Acute kidney injury on  chronic kidney disease stage III-IV -Creatinine improving.    Treated with gentle hydration and holding Lasix.  Renal function probably at baseline now.  Will resume Lasix on  discharge but at 20 mg daily.  Follow-up with primary care provider with repeat BMP in a week -Renal ultrasound showed no hydronephrosis.  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 as an outpatient.  Admitting hospitalist spoke to [name] on phone on 05/28/2017.  Outpatient follow-up with [name].  Last chemotherapy dose was held because of acute kidney injury. -Overall prognosis is guarded to poor.  Palliative care also evaluated the patient.  If condition worsens, might benefit from hospice.  For now, patient wants to speak to [name] before making any decisions.  Tobacco abuse -Ongoing.  Counseled regarding cessation.  Recurrent pleural effusion -Probably malignancy related. -Status post right-sided thoracentesis and removal of 1.2 L fluid on 05/28/2017 -Outpatient follow-up  Chronic diastolic heart failure -Currently does not look fluid overloaded.    Lasix held on admission.  Will resume Lasix at half the dose.  Outpatient follow-up with cardiology  Chronic anemia of malignancy/renal disease -Hemoglobin stable.    Outpatient follow-up    Discharge Instructions  Discharge Instructions    Ambulatory referral to Oncology   Complete by:  As directed    Lung cancer   Call MD for:  difficulty breathing, headache or visual disturbances   Complete by:  As directed    Call MD for:  extreme fatigue   Complete by:  As directed    Call MD for:  hives   Complete by:  As directed    Call MD for:  persistant dizziness or light-headedness   Complete by:  As directed    Call MD for:  persistant nausea and vomiting   Complete by:  As directed    Call MD for:  severe uncontrolled pain  Complete by:  As directed    Call MD for:  temperature >100.4   Complete by:  As directed    Diet - low sodium heart healthy   Complete by:  As directed    Increase activity slowly   Complete by:  As directed      Allergies as of  05/31/2017   No Known Allergies     Medication List    STOP taking these medications   doxycycline 100 MG tablet Commonly known as:  VIBRA-TABS     TAKE these medications   acetaminophen 500 MG tablet Commonly known as:  TYLENOL Take 500 mg by mouth 2 (two) times daily as needed for mild pain or headache.   albuterol (2.5 MG/3ML) 0.083% nebulizer solution Commonly known as:  PROVENTIL 1 neb every 4-6 hours as needed for wheezing and shortness of breath What changed:    how much to take  how to take this  when to take this  reasons to take this  additional instructions   ALKA-SELTZER PLUS COLD PO Take 2 tablets by mouth at bedtime as needed (COUGH).   amLODipine 10 MG tablet Commonly known as:  NORVASC Take 10 mg by mouth daily.   dexamethasone 4 MG tablet Commonly known as:  DECADRON Take 4 mg by mouth See admin instructions. Take 1 tablet twice daily the day before, the day of and the day after chemotherapy every 3 weeks.   escitalopram 5 MG tablet Commonly known as:  LEXAPRO Take 1 tablet (5 mg total) by mouth at bedtime.   feeding supplement (ENSURE ENLIVE) Liqd Take 237 mLs by mouth 3 (three) times daily between meals.   fluconazole 100 MG tablet Commonly known as:  DIFLUCAN Take 1 tablet (100 mg total) by mouth daily. What changed:  additional instructions   Fluticasone-Umeclidin-Vilant 100-62.5-25 MCG/INH Aepb Commonly known as:  TRELEGY ELLIPTA Inhale 1 puff into the lungs daily.   furosemide 40 MG tablet Commonly known as:  LASIX Take 0.5 tablets (20 mg total) by mouth daily. What changed:  how much to take   guaiFENesin 600 MG 12 hr tablet Commonly known as:  MUCINEX Take 1 tablet (600 mg total) by mouth 2 (two) times daily.   guaiFENesin-dextromethorphan 100-10 MG/5ML syrup Commonly known as:  ROBITUSSIN DM Take 5 mLs by mouth every 4 (four) hours as needed for cough.   Ipratropium-Albuterol 20-100 MCG/ACT Aers respimat Commonly known  as:  COMBIVENT Inhale 1 puff into the lungs every 6 (six) hours.   loratadine 10 MG tablet Commonly known as:  CLARITIN Take 10 mg by mouth daily.   metoprolol tartrate 25 MG tablet Commonly known as:  LOPRESSOR Take 25 mg by mouth 2 (two) times daily.   ondansetron 4 MG disintegrating tablet Commonly known as:  ZOFRAN-ODT Take 4 mg by mouth every 8 (eight) hours as needed for nausea or vomiting.   Oxycodone HCl 10 MG Tabs Take 1 tablet (10 mg total) by mouth every 4 (four) hours as needed (for pain/shortness of breath). What changed:  reasons to take this   pantoprazole 40 MG tablet Commonly known as:  PROTONIX Take 1 tablet (40 mg total) by mouth 2 (two) times daily.   polyethylene glycol packet Commonly known as:  MIRALAX / GLYCOLAX Take 17 g by mouth daily as needed for mild constipation.   predniSONE 20 MG tablet Commonly known as:  DELTASONE Take 2 tablets (40 mg total) by mouth daily for 7 days. What changed:  medication strength  how much to take  additional instructions   SENNA-PLUS 8.6-50 MG tablet Generic drug:  senna-docusate Take 1 tablet by mouth at bedtime.            Durable Medical Equipment  (From admission, onward)        Start     Ordered   05/31/17 1124  DME Oxygen  Once    Question Answer Comment  Mode or (Route) Mask   Liters per Minute 2   Frequency Continuous (stationary and portable oxygen unit needed)   Oxygen conserving device Yes   Oxygen delivery system Gas      05/31/17 1124     Follow-up Information    [name], MD. Schedule an appointment as soon as possible for a visit in 1 week(s).   Specialty:  Neurology Why:  with repeat cbc/bmp Contact information: [address] [place] [address] [phone]        Curt Bears, MD Follow up.   Specialty:  Oncology Why:  at [facility] information: [place] [address] [phone]           No Known Allergies  Consultations:  Palliative care   Procedures/Studies: Dg Chest 1 View  Result Date: 05/28/2017 CLINICAL DATA:  Status post right thoracentesis. EXAM: CHEST  1 VIEW COMPARISON:  05/28/2017 FINDINGS: Port-A-Cath tip is near the superior cavoatrial junction and stable. Improved aeration at the right lung base compatible with thoracentesis and decreased pleural fluid. There is persistent blunting at the right lung base compatible with residual pleural fluid or possible scarring. Stable blunting at the left lung base. Negative for a pneumothorax. Heart size is stable and within normal limits. The trachea is midline. IMPRESSION: Decreased amount of right pleural fluid and compatible with recent thoracentesis. Negative for pneumothorax. Electronically Signed   By: Markus Daft M.D.   On: 05/28/2017 13:06   US Renal  Result Date: 05/29/2017 CLINICAL DATA:  Renal failure. History of hypertension and lung cancer. EXAM: RENAL / URINARY TRACT ULTRASOUND COMPLETE COMPARISON:  None. FINDINGS: Right Kidney: Length: 9.8 cm. Renal cortex is diffusely echogenic. No mass or hydronephrosis visualized. Left Kidney: Length: 10.3 cm. Echogenicity within normal limits. No mass or hydronephrosis visualized. Bladder: Bladder is decompressed status post voiding. Bilateral pleural effusions. IMPRESSION: 1. RIGHT kidney appears echogenic suggesting chronic medical renal disease. Echogenicity of the LEFT kidney is within normal limits. No hydronephrosis bilaterally. 2. Bladder is decompressed. 3. Bilateral pleural effusions. Electronically Signed   By: Franki Cabot M.D.   On: 05/29/2017 14:11   Dg Chest Port 1 View  Result Date: 05/28/2017 CLINICAL DATA:  Lung cancer.  Short of breath EXAM: PORTABLE CHEST 1 VIEW COMPARISON:  05/20/2017 FINDINGS: Port in the anterior chest wall with tip in distal SVC. Normal cardiac silhouette. Interval increase in RIGHT pleural effusion. No pulmonary edema. No pneumothorax.  IMPRESSION: Significant increase in RIGHT pleural effusion which is moderate in volume. Electronically Signed   By: Suzy Bouchard M.D.   On: 05/28/2017 08:43   Dg Chest Port 1v Same Day  Result Date: 05/28/2017 CLINICAL DATA:  Shortness of breath.  Pleural effusion. EXAM: PORTABLE CHEST 1 VIEW COMPARISON:  Radiograph May 28, 2017. FINDINGS: The heart size and mediastinal contours are within normal limits. No pneumothorax or pleural effusion is noted. Right internal jugular Port-A-Cath is noted which is unchanged in position. Right lung is clear. Emphysematous disease is noted in the left upper lobe.  The visualized skeletal structures are unremarkable. IMPRESSION: No acute abnormality seen in the chest. Emphysema (ICD10-J43.9). Electronically Signed   By: Marijo Conception, M.D.   On: 05/28/2017 14:33   Dg Abdomen Acute W/chest  Result Date: 05/20/2017 CLINICAL DATA:  Shortness of breath,, cough, and weakness, with abdominal and back pain. History of lung malignancy, COPD, and hypertension. EXAM: DG ABDOMEN ACUTE W/ 1V CHEST COMPARISON:  PA and lateral chest x-ray of April 27, 2017 FINDINGS: The lungs are hyperinflated with hemidiaphragm flattening. There are new small bilateral pleural effusions. The perihilar lung markings on the right are increased and more conspicuous today. There is stable suprahilar density on the left. The heart is normal in size. The pulmonary vascularity is not engorged. The power port catheter tip projects over the distal portion of the SVC. Within the abdomen there is an air in fluid level within the moderately distended stomach. No small or large bowel obstructive pattern is observed. There is gas in the rectum. There are no abnormal soft tissue calcifications. A metallic bullet like density projects over the left aspect of the pelvis. There is mild degenerative change of the lower lumbar spine. IMPRESSION: COPD. Mildly increased right suprahilar lung markings as compared to the  previous study. This may reflect interstitial pneumonia or a central obstructing process. Stable increased density in the left suprahilar region. New small bilateral pleural effusions since the April 7th chest x-ray. Correlation with the patient's clinical and laboratory values is needed. Repeat chest CT scanning may be useful. Mild distention of the stomach with fluid and gas. This may reflect a gastroenteritis type process. No evidence of a small or large bowel obstruction. Electronically Signed   By: David  Martinique M.D.   On: 05/20/2017 11:35   US Thoracentesis Asp Pleural Space W/img Guide  Result Date: 05/28/2017 INDICATION: Patient with history of metastatic lung cancer, dyspnea, recurrent right pleural effusion. Request made for diagnostic and therapeutic right thoracentesis. EXAM: ULTRASOUND GUIDED DIAGNOSTIC AND THERAPEUTIC RIGHT THORACENTESIS MEDICATIONS: None COMPLICATIONS: None immediate. PROCEDURE: An ultrasound guided thoracentesis was thoroughly discussed with the patient and questions answered. The benefits, risks, alternatives and complications were also discussed. The patient understands and wishes to proceed with the procedure. Written consent was obtained. Ultrasound was performed to localize and mark an adequate pocket of fluid in the right chest. The area was then prepped and draped in the normal sterile fashion. 1% Lidocaine was used for local anesthesia. Under ultrasound guidance a 6 Fr Safe-T-Centesis catheter was introduced. Thoracentesis was performed. The catheter was removed and a dressing applied. FINDINGS: A total of approximately 1.2 liters of bloody fluid was removed. Samples were sent to the laboratory as requested by the clinical team. IMPRESSION: Successful ultrasound guided diagnostic and therapeutic right thoracentesis yielding 1.2 liters of pleural fluid. Follow-up chest x-ray revealed no pneumothorax. Read by: Rowe Robert, PA-C Electronically Signed   By: Jacqulynn Cadet  M.D.   On: 05/28/2017 13:17    Right-sided thoracentesis on 05/28/2017   Subjective: Patient seen and examined at bedside.  He denies any worsening shortness of breath, nausea, vomiting.  Discharge Exam: Vitals:   05/30/17 2215 05/31/17 1040  BP: (!) 139/57   Pulse: 86   Resp: 20   Temp: 98.6 F (37 C)   SpO2: 98% 96%   Vitals:   05/30/17 2050 05/30/17 2215 05/31/17 0500 05/31/17 1040  BP:  (!) 139/57    Pulse:  86    Resp:  20    Temp:  98.6 F (37 C)    TempSrc:  Oral    SpO2: 98% 98%  96%  Weight:   56.3 kg (124 lb 1.9 oz)   Height:        General: Pt is alert, awake, not in acute distress Cardiovascular: Rate controlled, S1/S2 + Respiratory: Decreased breath sounds at bases with some scattered crackles Abdominal: Soft, NT, ND, bowel sounds + Extremities: Trace edema, no cyanosis    The results of significant diagnostics from this hospitalization (including imaging, microbiology, ancillary and laboratory) are listed below for reference.     Microbiology: Recent Results (from the past 240 hour(s))  Body fluid culture     Status: None (Preliminary result)   Collection Time: 05/28/17 12:29 PM  Result Value Ref Range Status   Specimen Description   Final    PLEURAL RIGHT Performed at [facility], [place] [address], [place], [place] [address]    Special Requests   Final    NONE Performed at [facility], [place] [address], [place], [place] [address]    Gram Stain   Final    ABUNDANT WBC PRESENT,BOTH PMN AND MONONUCLEAR NO ORGANISMS SEEN    Culture   Final    NO GROWTH 3 DAYS Performed at [facility], [place] [address] [place], [place] [address]    Report Status PENDING  Incomplete  MRSA PCR Screening     Status: None   Collection Time: 05/28/17  4:29 PM  Result Value Ref Range Status   MRSA by PCR NEGATIVE NEGATIVE Final    Comment:        The GeneXpert MRSA Assay (FDA approved for NASAL specimens only), is one  component of a comprehensive MRSA colonization surveillance program. It is not intended to diagnose MRSA infection nor to guide or monitor treatment for MRSA infections. Performed at [facility], [place] [address] [place], [place] [address]      Labs: BNP (last 3 results) Recent Labs    04/10/17 0927 04/14/17 1900 05/28/17 0830  BNP 49.4 155.4* 295.6*   Basic Metabolic Panel: Recent Labs  Lab 05/28/17 0830 05/28/17 1450 05/29/17 0441 05/30/17 0759 05/31/17 0342  NA 131*  --  134* 138 140  K 4.7  --  4.2 4.5 4.4  CL 89*  --  95* 100* 102  CO2 26  --  25 28 28   GLUCOSE 160*  --  200* 139* 153*  BUN 52*  --  59* 49* 42*  CREATININE 3.71* 3.50* 3.06* 1.97* 2.00*  CALCIUM 10.1  --  9.3 9.4 9.5  MG  --   --   --   --  1.9   Liver Function Tests: Recent Labs  Lab 05/29/17 0441  AST 20  ALT 8*  ALKPHOS 87  BILITOT 0.4  PROT 6.6  ALBUMIN 3.0*   No results for input(s): LIPASE, AMYLASE in the last 168 hours. No results for input(s): AMMONIA in the last 168 hours. CBC: Recent Labs  Lab 05/28/17 0931 05/28/17 1450 05/29/17 0441 05/30/17 0759 05/31/17 0342  WBC 5.3 6.5 5.7 10.4 10.0  NEUTROABS 4.7  --   --  9.8* 9.5*  HGB 8.6* 8.4* 7.7* 7.5* 7.6*  HCT 25.8* 25.9* 22.9* 22.3* 23.4*  MCV 100.8* 98.9 100.0 100.5* 101.7*  PLT 198 231 199 199 225   Cardiac Enzymes: No results for input(s): CKTOTAL, CKMB, CKMBINDEX, TROPONINI in the last 168 hours. BNP: Invalid input(s): POCBNP CBG: Recent Labs  Lab 05/29/17 2119 05/30/17 0759 05/30/17 1159 05/30/17 1708 05/31/17 0832  GLUCAP 199* 140* 233* 191* 205*   D-Dimer No results for input(s): DDIMER in the last 72 hours. Hgb A1c No results for input(s): HGBA1C in the last 72 hours. Lipid Profile No results for input(s): CHOL, HDL, LDLCALC, TRIG, CHOLHDL, LDLDIRECT in the last 72 hours. Thyroid function studies No results for input(s): TSH, T4TOTAL, T3FREE, THYROIDAB in the last 72  hours.  Invalid input(s): FREET3 Anemia work up No results for input(s): VITAMINB12, FOLATE, FERRITIN, TIBC, IRON, RETICCTPCT in the last 72 hours. Urinalysis    Component Value Date/Time   COLORURINE YELLOW 05/20/2017 0940   APPEARANCEUR CLEAR 05/20/2017 0940   LABSPEC 1.015 05/20/2017 0940   PHURINE 5.0 05/20/2017 0940   GLUCOSEU NEGATIVE 05/20/2017 0940   HGBUR NEGATIVE 05/20/2017 0940   BILIRUBINUR NEGATIVE 05/20/2017 0940   KETONESUR 5 (A) 05/20/2017 0940   PROTEINUR 30 (A) 05/20/2017 0940   UROBILINOGEN 0.2 05/29/2011 0122   NITRITE NEGATIVE 05/20/2017 0940   LEUKOCYTESUR NEGATIVE 05/20/2017 0940   Sepsis Labs Invalid input(s): PROCALCITONIN,  WBC,  LACTICIDVEN Microbiology Recent Results (from the past 240 hour(s))  Body fluid culture     Status: None (Preliminary result)   Collection Time: 05/28/17 12:29 PM  Result Value Ref Range Status   Specimen Description   Final    PLEURAL RIGHT Performed at [facility], [place] [address] [place], [place] [address]    Special Requests   Final    NONE Performed at [facility], [place] [address], [place], [place] [address]    Gram Stain   Final    ABUNDANT WBC PRESENT,BOTH PMN AND MONONUCLEAR NO ORGANISMS SEEN    Culture   Final    NO GROWTH 3 DAYS Performed at [facility], [place] [address] [place], [place] [address]    Report Status PENDING  Incomplete  MRSA PCR Screening     Status: None   Collection Time: 05/28/17  4:29 PM  Result Value Ref Range Status   MRSA by PCR NEGATIVE NEGATIVE Final    Comment:        The GeneXpert MRSA Assay (FDA approved for NASAL specimens only), is one component of a comprehensive MRSA colonization surveillance program. It is not intended to diagnose MRSA infection nor to guide or monitor treatment for MRSA infections. Performed at [facility], [place] [address] [place], [place] [address]      Time coordinating  discharge:35 minutes  SIGNED:   Aline August, MD  [facility] 05/31/2017, 11:25 AM Pager: [phone]  If 7PM-7AM, please contact night-coverage [email or web address] Password [email or web address]

## 2017-05-31 NOTE — Progress Notes (Signed)
Patient transported from unit via stretcher with [facility] transport with O2 [place] 3L. Portable DNR and med list provided to EMS transporters.  Margurite Auerbach, RN

## 2017-05-31 NOTE — Clinical Social Work Note (Signed)
Clinical Social Work Assessment  Patient Details  Name: [name] MRN: [id number] Date of Birth: Dec 24, 1960  Date of referral:  05/31/17               Reason for consult:  Facility Placement                Permission sought to share information with:  Family Supports Permission granted to share information::  Yes, Verbal Permission Granted  Name::        Agency::  [facility]  Relationship::     Contact Information:     Housing/Transportation Living arrangements for the past 2 months:  Assisted Living Facility([facility]) Source of Information:  Patient Patient Interpreter Needed:  None Criminal Activity/Legal Involvement Pertinent to Current Situation/Hospitalization:    Significant Relationships:  Warehouse manager Lives with:  Facility Resident Do you feel safe going back to the place where you live?  Yes Need for family participation in patient care:  Yes (Comment)  Care giving concerns:  Patient had no family at bedside. patient stated he is from [place] and would like to return back to facility  Facilities manager / plan:  CSW reached out to facility to verify they would be able to take patient back.CSW spoke with [facility] and she stated they will be abel to take patient back once summary has been faxed to them. CSW faxed summary and fl2 to facility.  Patient requested a Rolator from [place] stating that he feels more comfortable with the equipment because it allows him to set down when he gets tired. CSW relayed patients request to [facility]. CSW will contact [facility] on behalf of patient for transport  Employment status:  Disabled (Comment on whether or not currently receiving Disability) Insurance information:  Medicaid [place] PT Recommendations:  No Follow Up Information / Referral to community resources:  Other (Comment Required)(ALF)  Patient/Family's Response to care:  Patient eager to go home  Patient/Family's Understanding of and Emotional  Response to Diagnosis, Current Treatment, and Prognosis:  Patient wants to return back to facility  Emotional Assessment Appearance:  Appears stated age Attitude/Demeanor/Rapport:  Engaged Affect (typically observed):  Accepting Orientation:  Oriented to Self, Oriented to Place, Oriented to  Time, Oriented to Situation Alcohol / Substance use:  Not Applicable Psych involvement (Current and /or in the community):  No (Comment)  Discharge Needs  Concerns to be addressed:  No discharge needs identified Readmission within the last 30 days:  No Current discharge risk:  None Barriers to Discharge:  No Barriers Identified   Wende Neighbors, LCSW 05/31/2017, 12:14 PM

## 2017-05-31 NOTE — Progress Notes (Addendum)
Clinical Social Worker facilitated patient discharge including contacting patient family and facility to confirm patient discharge plans.  Clinical information faxed to facility and family agreeable with plan.  CSW arranged ambulance transport via [facility] to [facility](ptar was scheduled to pick pt up at 3pm) .  RN to call [phone] (ask to speak to [facility]) for report prior to discharge.  Clinical Social Worker will sign off for now as social work intervention is no longer needed. Please consult [place] again if new need arises.  Rhea Pink, MSW, [place]

## 2017-05-31 NOTE — Progress Notes (Signed)
Discharge report called to [facility] at [facility]. AVS summary will be sent with transport along with d/c packet.   Margurite Auerbach, RN

## 2017-06-03 ENCOUNTER — Inpatient Hospital Stay: Payer: Medicaid Other | Attending: Internal Medicine

## 2017-06-03 ENCOUNTER — Inpatient Hospital Stay (HOSPITAL_BASED_OUTPATIENT_CLINIC_OR_DEPARTMENT_OTHER): Payer: Medicaid Other | Admitting: Internal Medicine

## 2017-06-03 ENCOUNTER — Inpatient Hospital Stay: Payer: Medicaid Other

## 2017-06-03 ENCOUNTER — Other Ambulatory Visit: Payer: Self-pay | Admitting: *Deleted

## 2017-06-03 ENCOUNTER — Encounter: Payer: Self-pay | Admitting: Internal Medicine

## 2017-06-03 VITALS — BP 114/74 | HR 65 | Temp 97.7°F | Resp 18 | Ht 72.0 in | Wt 123.6 lb

## 2017-06-03 DIAGNOSIS — N183 Chronic kidney disease, stage 3 unspecified: Secondary | ICD-10-CM

## 2017-06-03 DIAGNOSIS — C3492 Malignant neoplasm of unspecified part of left bronchus or lung: Secondary | ICD-10-CM

## 2017-06-03 DIAGNOSIS — J449 Chronic obstructive pulmonary disease, unspecified: Secondary | ICD-10-CM | POA: Diagnosis not present

## 2017-06-03 DIAGNOSIS — Z5111 Encounter for antineoplastic chemotherapy: Secondary | ICD-10-CM | POA: Diagnosis not present

## 2017-06-03 LAB — COMPREHENSIVE METABOLIC PANEL
ALBUMIN: 3.2 g/dL — AB (ref 3.5–5.0)
ALT: 20 U/L (ref 0–55)
ANION GAP: 7 (ref 3–11)
AST: 23 U/L (ref 5–34)
Alkaline Phosphatase: 113 U/L (ref 40–150)
BUN: 39 mg/dL — ABNORMAL HIGH (ref 7–26)
CHLORIDE: 100 mmol/L (ref 98–109)
CO2: 32 mmol/L — AB (ref 22–29)
Calcium: 9.4 mg/dL (ref 8.4–10.4)
Creatinine, Ser: 1.57 mg/dL — ABNORMAL HIGH (ref 0.70–1.30)
GFR calc Af Amer: 55 mL/min — ABNORMAL LOW (ref >60)
GFR calc non Af Amer: 47 mL/min — ABNORMAL LOW (ref >60)
GLUCOSE: 173 mg/dL — AB (ref 70–140)
POTASSIUM: 4.3 mmol/L (ref 3.5–5.1)
SODIUM: 139 mmol/L (ref 136–145)
Total Bilirubin: 0.2 mg/dL (ref 0.2–1.2)
Total Protein: 6.5 g/dL (ref 6.4–8.3)

## 2017-06-03 LAB — CBC WITH DIFFERENTIAL/PLATELET
Basophils Absolute: 0 10*3/uL (ref 0.0–0.1)
Basophils Relative: 0 %
Eosinophils Absolute: 0 10*3/uL (ref 0.0–0.5)
Eosinophils Relative: 0 %
HEMATOCRIT: 29.3 % — AB (ref 38.4–49.9)
Hemoglobin: 9.7 g/dL — ABNORMAL LOW (ref 13.0–17.1)
LYMPHS ABS: 0.1 10*3/uL — AB (ref 0.9–3.3)
LYMPHS PCT: 1 %
MCH: 33.8 pg — ABNORMAL HIGH (ref 27.2–33.4)
MCHC: 33.2 g/dL (ref 32.0–36.0)
MCV: 101.8 fL — AB (ref 79.3–98.0)
MONO ABS: 0.5 10*3/uL (ref 0.1–0.9)
MONOS PCT: 7 %
NEUTROS ABS: 7.4 10*3/uL — AB (ref 1.5–6.5)
Neutrophils Relative %: 92 %
Platelets: 242 10*3/uL (ref 140–400)
RBC: 2.88 MIL/uL — ABNORMAL LOW (ref 4.20–5.82)
RDW: 18.3 % — ABNORMAL HIGH (ref 11.0–14.6)
WBC: 8 10*3/uL (ref 4.0–10.3)

## 2017-06-03 MED ORDER — CYANOCOBALAMIN 1000 MCG/ML IJ SOLN
1000.0000 ug | Freq: Once | INTRAMUSCULAR | Status: AC
  Administered 2017-06-03: 1000 ug via INTRAMUSCULAR

## 2017-06-03 MED ORDER — PROCHLORPERAZINE MALEATE 10 MG PO TABS
ORAL_TABLET | ORAL | Status: AC
  Filled 2017-06-03: qty 1

## 2017-06-03 MED ORDER — SODIUM CHLORIDE 0.9 % IV SOLN
515.0000 mg/m2 | Freq: Once | INTRAVENOUS | Status: AC
  Administered 2017-06-03: 900 mg via INTRAVENOUS
  Filled 2017-06-03: qty 20

## 2017-06-03 MED ORDER — SODIUM CHLORIDE 0.9 % IV SOLN
Freq: Once | INTRAVENOUS | Status: AC
  Administered 2017-06-03: 12:00:00 via INTRAVENOUS

## 2017-06-03 MED ORDER — CYANOCOBALAMIN 1000 MCG/ML IJ SOLN
INTRAMUSCULAR | Status: AC
  Filled 2017-06-03: qty 1

## 2017-06-03 MED ORDER — PROCHLORPERAZINE MALEATE 10 MG PO TABS
10.0000 mg | ORAL_TABLET | Freq: Once | ORAL | Status: AC
  Administered 2017-06-03: 10 mg via ORAL

## 2017-06-03 MED ORDER — HEPARIN SOD (PORK) LOCK FLUSH 100 UNIT/ML IV SOLN
500.0000 [IU] | Freq: Once | INTRAVENOUS | Status: AC | PRN
  Administered 2017-06-03: 500 [IU]
  Filled 2017-06-03: qty 5

## 2017-06-03 MED ORDER — SODIUM CHLORIDE 0.9% FLUSH
10.0000 mL | INTRAVENOUS | Status: DC | PRN
  Administered 2017-06-03: 10 mL
  Filled 2017-06-03: qty 10

## 2017-06-03 NOTE — Patient Instructions (Signed)
Steps to Quit Smoking Smoking tobacco can be bad for your health. It can also affect almost every organ in your body. Smoking puts you and people around you at risk for many serious long-lasting (chronic) diseases. Quitting smoking is hard, but it is one of the best things that you can do for your health. It is never too late to quit. What are the benefits of quitting smoking? When you quit smoking, you lower your risk for getting serious diseases and conditions. They can include:  Lung cancer or lung disease.  Heart disease.  Stroke.  Heart attack.  Not being able to have children (infertility).  Weak bones (osteoporosis) and broken bones (fractures).  If you have coughing, wheezing, and shortness of breath, those symptoms may get better when you quit. You may also get sick less often. If you are pregnant, quitting smoking can help to lower your chances of having a baby of low birth weight. What can I do to help me quit smoking? Talk with your doctor about what can help you quit smoking. Some things you can do (strategies) include:  Quitting smoking totally, instead of slowly cutting back how much you smoke over a period of time.  Going to in-person counseling. You are more likely to quit if you go to many counseling sessions.  Using resources and support systems, such as: ? Online chats with a counselor. ? Phone quitlines. ? Printed self-help materials. ? Support groups or group counseling. ? Text messaging programs. ? Mobile phone apps or applications.  Taking medicines. Some of these medicines may have nicotine in them. If you are pregnant or breastfeeding, do not take any medicines to quit smoking unless your doctor says it is okay. Talk with your doctor about counseling or other things that can help you.  Talk with your doctor about using more than one strategy at the same time, such as taking medicines while you are also going to in-person counseling. This can help make  quitting easier. What things can I do to make it easier to quit? Quitting smoking might feel very hard at first, but there is a lot that you can do to make it easier. Take these steps:  Talk to your family and friends. Ask them to support and encourage you.  Call phone quitlines, reach out to support groups, or work with a counselor.  Ask people who smoke to not smoke around you.  Avoid places that make you want (trigger) to smoke, such as: ? Bars. ? Parties. ? Smoke-break areas at work.  Spend time with people who do not smoke.  Lower the stress in your life. Stress can make you want to smoke. Try these things to help your stress: ? Getting regular exercise. ? Deep-breathing exercises. ? Yoga. ? Meditating. ? Doing a body scan. To do this, close your eyes, focus on one area of your body at a time from head to toe, and notice which parts of your body are tense. Try to relax the muscles in those areas.  Download or buy apps on your mobile phone or tablet that can help you stick to your quit plan. There are many free apps, such as QuitGuide from the CDC (Centers for Disease Control and Prevention). You can find more support from smokefree.gov and other websites.  This information is not intended to replace advice given to you by your health care provider. Make sure you discuss any questions you have with your health care provider. Document Released: 11/03/2008 Document   Revised: 09/05/2015 Document Reviewed: 05/24/2014 Elsevier Interactive Patient Education  2018 [employer] Inc.  

## 2017-06-03 NOTE — Progress Notes (Signed)
Okay to treat with creatinine of 1.57 per Dr. Earlie Server.

## 2017-06-03 NOTE — Patient Instructions (Signed)
[name] Discharge Instructions for Patients Receiving Chemotherapy  Today you received the following chemotherapy agents  Alimta  To help prevent nausea and vomiting after your treatment, we encourage you to take your nausea medication as directed  If you develop nausea and vomiting that is not controlled by your nausea medication, call the clinic.   BELOW ARE SYMPTOMS THAT SHOULD BE REPORTED IMMEDIATELY:  *FEVER GREATER THAN 100.5 F  *CHILLS WITH OR WITHOUT FEVER  NAUSEA AND VOMITING THAT IS NOT CONTROLLED WITH YOUR NAUSEA MEDICATION  *UNUSUAL SHORTNESS OF BREATH  *UNUSUAL BRUISING OR BLEEDING  TENDERNESS IN MOUTH AND THROAT WITH OR WITHOUT PRESENCE OF ULCERS  *URINARY PROBLEMS  *BOWEL PROBLEMS  UNUSUAL RASH Items with * indicate a potential emergency and should be followed up as soon as possible.  Feel free to call the clinic should you have any questions or concerns. The clinic phone number is [phone].  Please show the [place] at check-in to the Emergency Department and triage nurse.
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
## 2017-06-03 NOTE — Progress Notes (Signed)
Med reconciliation not completed as pt cannot tell me what he is taking other than  " A bunch of pills" . He did say he takes a stool softener and is not taking percocet all the time.

## 2017-06-04 ENCOUNTER — Telehealth: Payer: Self-pay | Admitting: Internal Medicine

## 2017-06-04 NOTE — Telephone Encounter (Signed)
Scheduled appt per 5/14 los - pt to get an updated schedule next visit.

## 2017-06-05 ENCOUNTER — Emergency Department (HOSPITAL_COMMUNITY): Payer: Medicaid Other

## 2017-06-05 ENCOUNTER — Other Ambulatory Visit: Payer: Self-pay

## 2017-06-05 ENCOUNTER — Inpatient Hospital Stay (HOSPITAL_COMMUNITY)
Admission: EM | Admit: 2017-06-05 | Discharge: 2017-06-10 | DRG: 194 | Disposition: A | Discharge status: Home / Self Care | Payer: Medicaid Other | Attending: Internal Medicine | Admitting: Internal Medicine

## 2017-06-05 ENCOUNTER — Encounter (HOSPITAL_COMMUNITY): Payer: Self-pay | Admitting: Emergency Medicine

## 2017-06-05 DIAGNOSIS — R52 Pain, unspecified: Secondary | ICD-10-CM

## 2017-06-05 DIAGNOSIS — G8929 Other chronic pain: Secondary | ICD-10-CM | POA: Diagnosis present

## 2017-06-05 DIAGNOSIS — C7951 Secondary malignant neoplasm of bone: Secondary | ICD-10-CM | POA: Diagnosis present

## 2017-06-05 DIAGNOSIS — Z9981 Dependence on supplemental oxygen: Secondary | ICD-10-CM

## 2017-06-05 DIAGNOSIS — R0602 Shortness of breath: Secondary | ICD-10-CM | POA: Diagnosis present

## 2017-06-05 DIAGNOSIS — I130 Hypertensive heart and chronic kidney disease with heart failure and stage 1 through stage 4 chronic kidney disease, or unspecified chronic kidney disease: Secondary | ICD-10-CM | POA: Diagnosis present

## 2017-06-05 DIAGNOSIS — T402X5A Adverse effect of other opioids, initial encounter: Secondary | ICD-10-CM | POA: Diagnosis present

## 2017-06-05 DIAGNOSIS — I10 Essential (primary) hypertension: Secondary | ICD-10-CM | POA: Diagnosis present

## 2017-06-05 DIAGNOSIS — F419 Anxiety disorder, unspecified: Secondary | ICD-10-CM | POA: Diagnosis present

## 2017-06-05 DIAGNOSIS — N183 Chronic kidney disease, stage 3 unspecified: Secondary | ICD-10-CM | POA: Diagnosis present

## 2017-06-05 DIAGNOSIS — Z720 Tobacco use: Secondary | ICD-10-CM | POA: Diagnosis present

## 2017-06-05 DIAGNOSIS — J440 Chronic obstructive pulmonary disease with acute lower respiratory infection: Secondary | ICD-10-CM | POA: Diagnosis present

## 2017-06-05 DIAGNOSIS — J449 Chronic obstructive pulmonary disease, unspecified: Secondary | ICD-10-CM | POA: Diagnosis present

## 2017-06-05 DIAGNOSIS — J189 Pneumonia, unspecified organism: Principal | ICD-10-CM | POA: Diagnosis present

## 2017-06-05 DIAGNOSIS — K2970 Gastritis, unspecified, without bleeding: Secondary | ICD-10-CM | POA: Diagnosis present

## 2017-06-05 DIAGNOSIS — C3492 Malignant neoplasm of unspecified part of left bronchus or lung: Secondary | ICD-10-CM | POA: Diagnosis present

## 2017-06-05 DIAGNOSIS — K5900 Constipation, unspecified: Secondary | ICD-10-CM | POA: Diagnosis present

## 2017-06-05 DIAGNOSIS — T502X5A Adverse effect of carbonic-anhydrase inhibitors, benzothiadiazides and other diuretics, initial encounter: Secondary | ICD-10-CM | POA: Diagnosis present

## 2017-06-05 DIAGNOSIS — E870 Hyperosmolality and hypernatremia: Secondary | ICD-10-CM | POA: Diagnosis present

## 2017-06-05 DIAGNOSIS — K5903 Drug induced constipation: Secondary | ICD-10-CM | POA: Diagnosis present

## 2017-06-05 DIAGNOSIS — Z79899 Other long term (current) drug therapy: Secondary | ICD-10-CM

## 2017-06-05 DIAGNOSIS — Z7951 Long term (current) use of inhaled steroids: Secondary | ICD-10-CM

## 2017-06-05 DIAGNOSIS — J441 Chronic obstructive pulmonary disease with (acute) exacerbation: Secondary | ICD-10-CM | POA: Diagnosis present

## 2017-06-05 DIAGNOSIS — Y95 Nosocomial condition: Secondary | ICD-10-CM | POA: Diagnosis present

## 2017-06-05 DIAGNOSIS — I5042 Chronic combined systolic (congestive) and diastolic (congestive) heart failure: Secondary | ICD-10-CM

## 2017-06-05 DIAGNOSIS — F329 Major depressive disorder, single episode, unspecified: Secondary | ICD-10-CM | POA: Diagnosis present

## 2017-06-05 LAB — COMPREHENSIVE METABOLIC PANEL
ALK PHOS: 88 U/L (ref 38–126)
ALT: 23 U/L (ref 17–63)
AST: 28 U/L (ref 15–41)
Albumin: 2.9 g/dL — ABNORMAL LOW (ref 3.5–5.0)
Anion gap: 10 (ref 5–15)
BUN: 36 mg/dL — AB (ref 6–20)
CO2: 32 mmol/L (ref 22–32)
CREATININE: 1.38 mg/dL — AB (ref 0.61–1.24)
Calcium: 9.5 mg/dL (ref 8.9–10.3)
Chloride: 103 mmol/L (ref 101–111)
GFR calc non Af Amer: 55 mL/min — ABNORMAL LOW (ref >60)
GLUCOSE: 109 mg/dL — AB (ref 65–99)
Potassium: 4 mmol/L (ref 3.5–5.1)
SODIUM: 145 mmol/L (ref 135–145)
Total Bilirubin: 0.6 mg/dL (ref 0.3–1.2)
Total Protein: 6.1 g/dL — ABNORMAL LOW (ref 6.5–8.1)

## 2017-06-05 LAB — CBC WITH DIFFERENTIAL/PLATELET
BASOS ABS: 0 10*3/uL (ref 0.0–0.1)
Basophils Relative: 0 %
Eosinophils Absolute: 0 10*3/uL (ref 0.0–0.7)
Eosinophils Relative: 0 %
HCT: 28.5 % — ABNORMAL LOW (ref 39.0–52.0)
HEMOGLOBIN: 9.2 g/dL — AB (ref 13.0–17.0)
LYMPHS ABS: 0.6 10*3/uL — AB (ref 0.7–4.0)
LYMPHS PCT: 6 %
MCH: 33.5 pg (ref 26.0–34.0)
MCHC: 32.3 g/dL (ref 30.0–36.0)
MCV: 103.6 fL — ABNORMAL HIGH (ref 78.0–100.0)
Monocytes Absolute: 0.5 10*3/uL (ref 0.1–1.0)
Monocytes Relative: 6 %
NEUTROS PCT: 88 %
Neutro Abs: 7.5 10*3/uL (ref 1.7–7.7)
Platelets: 184 10*3/uL (ref 150–400)
RBC: 2.75 MIL/uL — AB (ref 4.22–5.81)
RDW: 17.6 % — ABNORMAL HIGH (ref 11.5–15.5)
WBC: 8.6 10*3/uL (ref 4.0–10.5)

## 2017-06-05 LAB — PROCALCITONIN: Procalcitonin: 0.11 ng/mL

## 2017-06-05 LAB — STREP PNEUMONIAE URINARY ANTIGEN: Strep Pneumo Urinary Antigen: NEGATIVE

## 2017-06-05 LAB — BRAIN NATRIURETIC PEPTIDE: B Natriuretic Peptide: 73.1 pg/mL (ref 0.0–100.0)

## 2017-06-05 MED ORDER — VANCOMYCIN HCL IN DEXTROSE 1-5 GM/200ML-% IV SOLN
1000.0000 mg | Freq: Once | INTRAVENOUS | Status: AC
  Administered 2017-06-05: 1000 mg via INTRAVENOUS
  Filled 2017-06-05: qty 200

## 2017-06-05 MED ORDER — BUDESONIDE 0.25 MG/2ML IN SUSP
0.2500 mg | Freq: Two times a day (BID) | RESPIRATORY_TRACT | Status: DC
  Administered 2017-06-05 – 2017-06-06 (×2): 0.25 mg via RESPIRATORY_TRACT
  Filled 2017-06-05 (×2): qty 2

## 2017-06-05 MED ORDER — VANCOMYCIN HCL IN DEXTROSE 750-5 MG/150ML-% IV SOLN
750.0000 mg | INTRAVENOUS | Status: DC
  Administered 2017-06-06 – 2017-06-07 (×2): 750 mg via INTRAVENOUS
  Filled 2017-06-05 (×2): qty 150

## 2017-06-05 MED ORDER — OXYCODONE HCL 5 MG PO TABS
10.0000 mg | ORAL_TABLET | ORAL | Status: DC | PRN
  Administered 2017-06-05 – 2017-06-07 (×4): 10 mg via ORAL
  Filled 2017-06-05 (×4): qty 2

## 2017-06-05 MED ORDER — FUROSEMIDE 10 MG/ML IJ SOLN
40.0000 mg | Freq: Two times a day (BID) | INTRAMUSCULAR | Status: DC
  Administered 2017-06-05: 40 mg via INTRAVENOUS
  Filled 2017-06-05 (×2): qty 4

## 2017-06-05 MED ORDER — SODIUM CHLORIDE 0.9 % IV SOLN: 1.0000 g | Freq: Three times a day (TID) | INTRAVENOUS | Status: DC

## 2017-06-05 MED ORDER — FLUCONAZOLE 100 MG PO TABS
100.0000 mg | ORAL_TABLET | Freq: Every day | ORAL | Status: DC
  Administered 2017-06-05 – 2017-06-10 (×6): 100 mg via ORAL
  Filled 2017-06-05 (×6): qty 1

## 2017-06-05 MED ORDER — SENNOSIDES-DOCUSATE SODIUM 8.6-50 MG PO TABS
1.0000 | ORAL_TABLET | Freq: Every day | ORAL | Status: DC
  Administered 2017-06-05: 1 via ORAL
  Filled 2017-06-05: qty 1

## 2017-06-05 MED ORDER — FLUTICASONE-UMECLIDIN-VILANT 100-62.5-25 MCG/INH IN AEPB: 1.0000 | INHALATION_SPRAY | Freq: Every day | RESPIRATORY_TRACT | Status: DC

## 2017-06-05 MED ORDER — AZITHROMYCIN 250 MG PO TABS
500.0000 mg | ORAL_TABLET | ORAL | Status: DC
  Administered 2017-06-05 – 2017-06-10 (×6): 500 mg via ORAL
  Filled 2017-06-05 (×6): qty 2

## 2017-06-05 MED ORDER — ONDANSETRON HCL 4 MG/2ML IJ SOLN
4.0000 mg | Freq: Four times a day (QID) | INTRAMUSCULAR | Status: DC | PRN
  Administered 2017-06-05: 4 mg via INTRAVENOUS
  Filled 2017-06-05: qty 2

## 2017-06-05 MED ORDER — IPRATROPIUM-ALBUTEROL 0.5-2.5 (3) MG/3ML IN SOLN
3.0000 mL | Freq: Three times a day (TID) | RESPIRATORY_TRACT | Status: DC
  Administered 2017-06-06 – 2017-06-10 (×14): 3 mL via RESPIRATORY_TRACT
  Filled 2017-06-05 (×15): qty 3

## 2017-06-05 MED ORDER — AMLODIPINE BESYLATE 10 MG PO TABS
10.0000 mg | ORAL_TABLET | Freq: Every day | ORAL | Status: DC
  Administered 2017-06-05 – 2017-06-06 (×2): 10 mg via ORAL
  Filled 2017-06-05 (×2): qty 1

## 2017-06-05 MED ORDER — SODIUM CHLORIDE 0.9 % IV SOLN
1.0000 g | Freq: Three times a day (TID) | INTRAVENOUS | Status: DC
  Administered 2017-06-05 – 2017-06-06 (×3): 1 g via INTRAVENOUS
  Filled 2017-06-05 (×5): qty 1

## 2017-06-05 MED ORDER — ENSURE ENLIVE PO LIQD
237.0000 mL | Freq: Three times a day (TID) | ORAL | Status: DC
  Administered 2017-06-05 – 2017-06-10 (×3): 237 mL via ORAL

## 2017-06-05 MED ORDER — METOPROLOL TARTRATE 25 MG PO TABS
25.0000 mg | ORAL_TABLET | Freq: Two times a day (BID) | ORAL | Status: DC
  Administered 2017-06-05 – 2017-06-06 (×3): 25 mg via ORAL
  Filled 2017-06-05 (×3): qty 1

## 2017-06-05 MED ORDER — LORATADINE 10 MG PO TABS
10.0000 mg | ORAL_TABLET | Freq: Every day | ORAL | Status: DC
  Administered 2017-06-05 – 2017-06-10 (×6): 10 mg via ORAL
  Filled 2017-06-05 (×6): qty 1

## 2017-06-05 MED ORDER — ACETAMINOPHEN 650 MG RE SUPP: 650.0000 mg | Freq: Four times a day (QID) | RECTAL | Status: DC | PRN

## 2017-06-05 MED ORDER — IPRATROPIUM-ALBUTEROL 0.5-2.5 (3) MG/3ML IN SOLN
3.0000 mL | Freq: Four times a day (QID) | RESPIRATORY_TRACT | Status: DC
  Administered 2017-06-05: 3 mL via RESPIRATORY_TRACT
  Filled 2017-06-05: qty 3

## 2017-06-05 MED ORDER — DIPHENHYDRAMINE HCL 25 MG PO TABS
25.0000 mg | ORAL_TABLET | Freq: Every evening | ORAL | Status: DC | PRN
  Filled 2017-06-05: qty 1

## 2017-06-05 MED ORDER — ALBUTEROL SULFATE (2.5 MG/3ML) 0.083% IN NEBU
2.5000 mg | INHALATION_SOLUTION | RESPIRATORY_TRACT | Status: DC | PRN
  Administered 2017-06-06 – 2017-06-08 (×2): 2.5 mg via RESPIRATORY_TRACT
  Filled 2017-06-05 (×2): qty 3

## 2017-06-05 MED ORDER — ONDANSETRON HCL 4 MG PO TABS: 4.0000 mg | ORAL_TABLET | Freq: Four times a day (QID) | ORAL | Status: DC | PRN

## 2017-06-05 MED ORDER — ESCITALOPRAM OXALATE 10 MG PO TABS
5.0000 mg | ORAL_TABLET | Freq: Every day | ORAL | Status: DC
  Administered 2017-06-05 – 2017-06-09 (×5): 5 mg via ORAL
  Filled 2017-06-05 (×5): qty 1

## 2017-06-05 MED ORDER — GUAIFENESIN ER 600 MG PO TB12
600.0000 mg | ORAL_TABLET | Freq: Two times a day (BID) | ORAL | Status: DC
  Administered 2017-06-05 – 2017-06-06 (×2): 600 mg via ORAL
  Filled 2017-06-05 (×2): qty 1

## 2017-06-05 MED ORDER — CHLORPHEN-PHENYLEPH-ASA 2-7.8-325 MG PO TBEF: EFFERVESCENT_TABLET | Freq: Every evening | ORAL | Status: DC | PRN

## 2017-06-05 MED ORDER — POLYETHYLENE GLYCOL 3350 17 G PO PACK: 17.0000 g | PACK | Freq: Every day | ORAL | Status: DC | PRN

## 2017-06-05 MED ORDER — ACETAMINOPHEN 325 MG PO TABS: 650.0000 mg | ORAL_TABLET | Freq: Four times a day (QID) | ORAL | Status: DC | PRN

## 2017-06-05 MED ORDER — ALBUTEROL (5 MG/ML) CONTINUOUS INHALATION SOLN
10.0000 mg/h | INHALATION_SOLUTION | RESPIRATORY_TRACT | Status: DC
  Administered 2017-06-05: 10 mg/h via RESPIRATORY_TRACT
  Filled 2017-06-05: qty 20

## 2017-06-05 MED ORDER — LOTEPREDNOL ETABONATE 0.5 % OP GEL
1.0000 "application " | Freq: Three times a day (TID) | OPHTHALMIC | Status: DC
  Filled 2017-06-05 (×3): qty 5

## 2017-06-05 MED ORDER — PANTOPRAZOLE SODIUM 40 MG PO TBEC
40.0000 mg | DELAYED_RELEASE_TABLET | Freq: Two times a day (BID) | ORAL | Status: DC
  Administered 2017-06-05 – 2017-06-10 (×10): 40 mg via ORAL
  Filled 2017-06-05 (×10): qty 1

## 2017-06-05 MED ORDER — ENOXAPARIN SODIUM 40 MG/0.4ML ~~LOC~~ SOLN
40.0000 mg | SUBCUTANEOUS | Status: DC
  Filled 2017-06-05 (×4): qty 0.4

## 2017-06-05 NOTE — Progress Notes (Signed)
A consult was received from an ED physician for vancomycin per pharmacy dosing.  The patient's profile has been reviewed for ht/wt/allergies/indication/available labs.    A one time order has been placed for vancomycin 1000 mg IV x1.  Further antibiotics/pharmacy consults should be ordered by admitting physician if indicated.                       Thank you, Lynelle Doctor 06/05/2017  8:04 AM

## 2017-06-05 NOTE — H&P (Signed)
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  Patient was recently admitted from 05/28/2017 to 05/31/2017 for shortness of breath and had an ultrasound-guided thoracentesis done.  It was apparently felt that patient most likely had an acute COPD exacerbation and he was discharged home on oral prednisolone.  Since then he reports that his breathing has insidiously gotten worse.  He continues to report episodes of significant shortness of breath including episodes requiring him to sit upward.  He endorses increasing orthopnea.  He also has significant amounts of sputum that he is bringing up that is quite purulent.  He has been reportedly using his bronchodilators with some improvement but it stopped working for the past 2 days.  He does not clearly report any much improvement since discharge at all.  He is otherwise an extremely poor historian.  He denies any fevers, chills, nausea, vomiting.  He has chronic severe epigastric pain for which she has been treated empirically with PPIs and fluconazole.  He denies any diarrhea.  He denies any constipation.  He endorses consistent chest pain that appears to be similar to his epigastric pain.  He does not have any rash.  His most recent round of chemo was on 06/03/2017.   ED Course: In the ED patient's vitals were stable.  Patient was given bronchodilator with continued shortness of breath.  Labs are unremarkable.  Chest x-ray  showed possibly some new hazy right-sided infiltrates versus asymmetric edema.  Review of Systems: As per HPI otherwise 10 point review of systems negative.    Past Medical History:  Diagnosis Date  . Abdominal pain 06/04/2016  . Adenocarcinoma of left lung, stage 4 ([place]) 05/02/2016  . Alcohol abuse   . Bronchitis due to tobacco use ([place])   . Cancer ([place])    Lung  . COPD (chronic obstructive pulmonary disease) ([place])   . Dehydration 06/04/2016  . Encounter for antineoplastic chemotherapy 05/02/2016  . Gastritis   . Goals of care, counseling/discussion 05/02/2016  . Hematemesis   . HTN (hypertension) 10/30/2016  . Recurrent upper respiratory infection (URI)   . Seizures ([place]) 05/2011   new onset  . Shortness of breath     Past Surgical History:  Procedure Laterality Date  . ESOPHAGOGASTRODUODENOSCOPY (EGD) WITH PROPOFOL N/A 12/17/2016   Procedure: ESOPHAGOGASTRODUODENOSCOPY (EGD) WITH PROPOFOL;  Surgeon: Ladene Artist, MD;  Location: [facility];  Service: Endoscopy;  Laterality: N/A;  . IR FLUORO GUIDE PORT INSERTION RIGHT  05/13/2016  . IR US GUIDE VASC ACCESS RIGHT  05/13/2016  . NO PAST SURGERIES       reports that he has been smoking cigarettes.  He has a 3.00 pack-year smoking history. He has never used smokeless tobacco. He reports that he does not drink alcohol or use drugs.  No Known Allergies  Family History  Problem Relation Age of Onset  . Cancer Father   . Diabetes Mellitus II Mother      Prior to Admission medications  Medication Sig Start Date End Date Taking? Authorizing Provider  acetaminophen (TYLENOL) 500 MG tablet Take 500 mg by mouth 2 (two) times daily as needed for mild pain or headache.   Yes [provider]  albuterol (PROVENTIL) (2.5 MG/3ML) 0.083% nebulizer solution 1 neb every 4-6 hours as needed for wheezing and shortness of breath Patient taking differently: Take 2.5 mg by nebulization every 4 (four) hours as needed for wheezing  or shortness of breath.  04/01/17  Yes Parrett, Tammy S, NP  amLODipine (NORVASC) 10 MG tablet Take 10 mg by mouth daily.   Yes [provider]  dexamethasone (DECADRON) 4 MG tablet Take 4 mg by mouth See admin instructions. Take 1 tablet twice daily the day before, the day of and the day after chemotherapy every 3 weeks.    Yes [provider]  escitalopram (LEXAPRO) 5 MG tablet Take 1 tablet (5 mg total) by mouth at bedtime. 04/17/17  Yes Rosita Fire, MD  feeding supplement, ENSURE ENLIVE, (ENSURE ENLIVE) LIQD Take 237 mLs by mouth 3 (three) times daily between meals. 03/19/17  Yes Shelly Coss, MD  fluconazole (DIFLUCAN) 100 MG tablet Take 1 tablet (100 mg total) by mouth daily. Patient taking differently: Take 100 mg by mouth daily. Continuous 03/20/17  Yes Adhikari, Tamsen Meek, MD  Fluticasone-Umeclidin-Vilant (TRELEGY ELLIPTA) 100-62.5-25 MCG/INH AEPB Inhale 1 puff into the lungs daily. 05/31/17  Yes Aline August, MD  furosemide (LASIX) 40 MG tablet Take 0.5 tablets (20 mg total) by mouth daily. 05/31/17  Yes Aline August, MD  guaiFENesin (MUCINEX) 600 MG 12 hr tablet Take 1 tablet (600 mg total) by mouth 2 (two) times daily. 05/31/17  Yes Aline August, MD  guaiFENesin-dextromethorphan (ROBITUSSIN DM) 100-10 MG/5ML syrup Take 5 mLs by mouth every 4 (four) hours as needed for cough. 05/31/17  Yes Aline August, MD  Ipratropium-Albuterol (COMBIVENT) 20-100 MCG/ACT AERS respimat Inhale 1 puff into the lungs every 6 (six) hours. 05/31/17  Yes Aline August, MD  loratadine (CLARITIN) 10 MG tablet Take 10 mg by mouth daily.   Yes [provider]  metoprolol tartrate (LOPRESSOR) 25 MG tablet Take 25 mg by mouth 2 (two) times daily.   Yes [provider]  ondansetron (ZOFRAN-ODT) 4 MG disintegrating tablet Take 4 mg by mouth every 8 (eight) hours as needed for nausea or vomiting.   Yes [provider]  Oxycodone HCl 10 MG TABS Take 10 mg by mouth every  4 (four) hours as needed (pain).   Yes [provider]  pantoprazole (PROTONIX) 40 MG tablet Take 1 tablet (40 mg total) by mouth 2 (two) times daily. 12/18/16  Yes Eugenie Filler, MD  senna-docusate (SENNA-PLUS) 8.6-50 MG tablet Take 1 tablet by mouth at bedtime.   Yes [provider]  Chlorphen-Phenyleph-ASA (ALKA-SELTZER PLUS COLD PO) Take 2 tablets by mouth at bedtime as needed (COUGH).    [provider]  Oxycodone HCl 10 MG TABS Take 1 tablet (10 mg total) by mouth every 4 (four) hours as needed (for pain/shortness of breath). Patient not taking: Reported on 06/03/2017 05/31/17   Aline August, MD  polyethylene glycol (MIRALAX / GLYCOLAX) packet Take 17 g by mouth daily as needed for mild constipation. 03/19/17   Shelly Coss, MD  predniSONE (DELTASONE) 20 MG tablet Take 2 tablets (40 mg total) by mouth daily for 7 days. Patient not taking: Reported on 06/05/2017 05/31/17 06/07/17  Aline August, MD    Physical Exam: Vitals:   06/05/17 1000 06/05/17  1030 06/05/17 1100 06/05/17 1223  BP: 124/68 121/73 126/66   Pulse: 64 65 68   Resp: 18 17 18    Temp:      TempSrc:      SpO2: 100% 100% 99% 99%    Constitutional: NAD, calm, comfortable Vitals:   06/05/17 1000 06/05/17 1030 06/05/17 1100 06/05/17 1223  BP: 124/68 121/73 126/66   Pulse: 64 65 68   Resp: 18 17 18    Temp:      TempSrc:      SpO2: 100% 100% 99% 99%   Eyes: Anicteric sclera ENMT: Dry mucous membranes, poor dentition Neck: normal, supple Respiratory: Diminished lung sounds at bases, no increased work of breathing, no wheezes, slight crackles on right, no rhonchi Cardiovascular: Regular rate and rhythm, no murmurs Abdomen: Soft, mildly tender to epigastric palpation, no rebound or guarding, plus bowel sounds Musculoskeletal: 2+ lower extremity edema Skin: Port site is clean dry intact Neurologic: Fully intact, moving all extremities Psychiatric: Normal judgment and insight. Alert and  oriented x 3. Normal mood.    Labs on Admission: I have personally reviewed following labs and imaging studies  CBC: Recent Labs  Lab 05/30/17 0759 05/31/17 0342 06/03/17 1021 06/05/17 1059  WBC 10.4 10.0 8.0 8.6  NEUTROABS 9.8* 9.5* 7.4* 7.5  HGB 7.5* 7.6* 9.7* 9.2*  HCT 22.3* 23.4* 29.3* 28.5*  MCV 100.5* 101.7* 101.8* 103.6*  PLT 199 225 242 546   Basic Metabolic Panel: Recent Labs  Lab 05/30/17 0759 05/31/17 0342 06/03/17 1021 06/05/17 1059  NA 138 140 139 145  K 4.5 4.4 4.3 4.0  CL 100* 102 100 103  CO2 28 28 32* 32  GLUCOSE 139* 153* 173* 109*  BUN 49* 42* 39* 36*  CREATININE 1.97* 2.00* 1.57* 1.38*  CALCIUM 9.4 9.5 9.4 9.5  MG  --  1.9  --   --    GFR: Estimated Creatinine Clearance: 46.9 mL/min (A) (by C-G formula based on SCr of 1.38 mg/dL (H)). Liver Function Tests: Recent Labs  Lab 06/03/17 1021 06/05/17 1059  AST 23 28  ALT 20 23  ALKPHOS 113 88  BILITOT 0.2 0.6  PROT 6.5 6.1*  ALBUMIN 3.2* 2.9*   No results for input(s): LIPASE, AMYLASE in the last 168 hours. No results for input(s): AMMONIA in the last 168 hours. Coagulation Profile: No results for input(s): INR, PROTIME in the last 168 hours. Cardiac Enzymes: No results for input(s): CKTOTAL, CKMB, CKMBINDEX, TROPONINI in the last 168 hours. BNP (last 3 results) No results for input(s): PROBNP in the last 8760 hours. HbA1C: No results for input(s): HGBA1C in the last 72 hours. CBG: Recent Labs  Lab 05/30/17 0759 05/30/17 1159 05/30/17 1708 05/31/17 0832 05/31/17 1146  GLUCAP 140* 233* 191* 205* 177*   Lipid Profile: No results for input(s): CHOL, HDL, LDLCALC, TRIG, CHOLHDL, LDLDIRECT in the last 72 hours. Thyroid Function Tests: No results for input(s): TSH, T4TOTAL, FREET4, T3FREE, THYROIDAB in the last 72 hours. Anemia Panel: No results for input(s): VITAMINB12, FOLATE, FERRITIN, TIBC, IRON, RETICCTPCT in the last 72 hours. Urine analysis:    Component Value Date/Time    COLORURINE YELLOW 05/20/2017 0940   APPEARANCEUR CLEAR 05/20/2017 0940   LABSPEC 1.015 05/20/2017 0940   PHURINE 5.0 05/20/2017 0940   GLUCOSEU NEGATIVE 05/20/2017 0940   HGBUR NEGATIVE 05/20/2017 0940   BILIRUBINUR NEGATIVE 05/20/2017 0940   KETONESUR 5 (A) 05/20/2017 0940   PROTEINUR 30 (A) 05/20/2017 0940   UROBILINOGEN 0.2 05/29/2011 0122  NITRITE NEGATIVE 05/20/2017 0940   LEUKOCYTESUR NEGATIVE 05/20/2017 0940    Radiological Exams on Admission: Dg Chest 2 View  Result Date: 06/05/2017 CLINICAL DATA:  Fever.  Increased shortness of breath. EXAM: CHEST - 2 VIEW COMPARISON:  05/28/2017.  CT 03/01/2017 FINDINGS: PowerPort catheter noted with tip over cavoatrial junction. Heart size normal. Mild right upper lobe and right lower lobe and or asymmetric edema. Persistent left upper lobe density unchanged from prior CT of 03/01/2017, reference is made to that report. Bilateral pleuroparenchymal thickening again noted consistent scarring. Bilateral pleural effusions, right side greater than left. Similar findings noted on prior exam. Effusion. Heart size normal. No acute bony abnormality. IMPRESSION: 1. Right upper lobe and right lower lobe mild infiltrates and/or asymmetric edema. Bilateral pleural effusions, right side greater than left. 2. Persistent left upper lobe density as noted on prior CT of 03/01/2017. No interim change. Reference is made to prior CT report. 3.  PowerPort catheter in stable position. Electronically Signed   By: Marcello Moores  Register   On: 06/05/2017 07:28    EKG: Independently reviewed.  No EKG was performed  Assessment/Plan Principal Problem:   SOB (shortness of breath) Active Problems:   Tobacco abuse   Adenocarcinoma of left lung, stage 4 (HCC)   Spine metastasis (HCC)   COPD (chronic obstructive pulmonary disease) (HCC)   HTN (hypertension)   CKD (chronic kidney disease), stage III (HCC)  #) Shortness of breath: It is unclear how acute this is particularly as  patient's lab work and vitals are all completely unremarkable and his history is not entirely consistent with some acute event causing it.  Regardless the differential includes healthcare acquired pneumonia, COPD exacerbation, pulmonary embolism, diastolic heart failure.  Suspect that the most likely causes are possible pneumonia and/or diastolic heart failure exacerbation.  He does not appear to be particularly ill and he does not have an elevated white count. -IV furosemide 40 mg twice daily -Start IV cefepime and vancomycin for H CAP -Procalcitonin ordered -BNP ordered -Scheduled bronchodilators every 6 hours  #) COPD on 4 L nasal cannula: Patient has per the chart been mildly inconsistent on how much oxygen he is on with intermittently being on nothing and sometimes requiring up to 5 to 6 L. -Continue home LABA/ICS/L AMA -Scheduled bronchodilators every 6 hours -We will hold on steroids at this time as he is not wheezing and recently was treated for COPD exacerbation  #) Grade 2 diastolic dysfunction/chronic systolic heart failure: Clinically patient does have some symptoms of volume overload. -BNP pending -IV furosemide 40 mg twice daily -Hold home oral furosemide -Strict ins and outs, weigh daily, 2 g sodium restriction  #) Hypertension: -Continue amlodipine 10 mg daily -Continue metoprolol tartrate 25 mg twice daily  #) Stage III CKD: Stable  #) Pain/psych: -Continue escitalopram 5 mg nightly -Continue oxycodone 10 mg every 4 hours as needed  #) Epigastric pain: Likely secondary to gastritis -Continue PPI twice daily -Continue oral fluconazole  Fluids: Tolerating p.o. Elect lites: Monitor and supplement Nutrition: Regular diet  Prophylaxis: Enoxaparin  Disposition: Pending improved respiratory status  Full code (patient apparently has been inconsistent on this as well and is intermittently said DNR)   Cristy Folks MD [facility]   If 7PM-7AM, please  contact night-coverage [email or web address] Password [email or web address]  06/05/2017, 1:26 PM

## 2017-06-05 NOTE — Progress Notes (Signed)
Pharmacy Antibiotic Note  [name] is a 57 y.o. male with lung cancer on pemetrexed PTA, presented to the ED on 06/05/2017 with c/o SOB.  To start vancomycin for PNA.  - 5/16 CXR: Right upper lobe and right lower lobe mild infiltrates and/or asymmetric edema. Bilateral pleural effusions - afeb, wbc wnl, scr 1.38 (crcl~47)   Plan: - vancomycin 1000 mg IV x1 given in the ED, then 750 mg IV q24h for est AUC 477 - cefepime 1gm IV q8h per MD   ____________________________________  Temp (24hrs), Avg:97.8 F (36.6 C), Min:97.8 F (36.6 C), Max:97.8 F (36.6 C)  Recent Labs  Lab 05/30/17 0759 05/31/17 0342 06/03/17 1021 06/05/17 1059  WBC 10.4 10.0 8.0 8.6  CREATININE 1.97* 2.00* 1.57* 1.38*    Estimated Creatinine Clearance: 46.9 mL/min (A) (by C-G formula based on SCr of 1.38 mg/dL (H)).    No Known Allergies   Thank you for allowing pharmacy to be a part of this patient's care.  Lynelle Doctor 06/05/2017 1:49 PM

## 2017-06-05 NOTE — ED Notes (Signed)
ED TO INPATIENT HANDOFF REPORT  Name/Age/Gender [name] 57 y.o. male  Code Status Code Status History    Date Active Date Inactive Code Status Order ID Comments User Context   05/28/2017 1147 05/31/2017 2012 DNR [id number]  Nita Sells, MD ED   04/10/2017 1148 04/17/2017 1837 Full Code [id number]  Damita Lack, MD ED   03/09/2017 2359 03/19/2017 1931 Full Code [id number]  Purohit, Konrad Dolores, MD Inpatient   02/14/2017 0902 02/16/2017 1455 Full Code [id number]  Florencia Reasons, MD Inpatient   02/10/2017 0031 02/14/2017 0902 DNR [id number]  Norval Morton, MD ED   12/30/2016 2325 01/07/2017 2109 DNR [id number]  Elodia [name]., MD Inpatient   12/17/2016 0934 12/24/2016 1811 DNR [id number]  Thurnell Lose, MD Inpatient   12/16/2016 1649 12/17/2016 0934 Full Code [id number]  Charlynne Cousins, MD Inpatient    Questions for Most Recent Historical Code Status (Order [id number])    Question Answer Comment   In the event of cardiac or respiratory ARREST Do not call a "code blue"    In the event of cardiac or respiratory ARREST Do not perform Intubation, CPR, defibrillation or ACLS    In the event of cardiac or respiratory ARREST Use medication by any route, position, wound care, and other measures to relive pain and suffering. May use oxygen, suction and manual treatment of airway obstruction as needed for comfort.       Home/SNF/Other Home  Chief Complaint short of breath  Level of Care/Admitting Diagnosis ED Disposition    ED Disposition Condition Comment   Admit  [facility] Area: [facility] [[id number]]  Level of Care: Med-Surg [16]  Diagnosis: HCAP (healthcare-associated pneumonia) [250539]  Admitting Physician: Cristy Folks [[id number]]  Attending Physician: Cristy Folks [[id number]]  PT Class (Do Not Modify): Observation [104]  PT Acc Code (Do Not Modify): Observation [10022]       Medical History Past Medical History:  Diagnosis Date  . Abdominal  pain 06/04/2016  . Adenocarcinoma of left lung, stage 4 ([place]) 05/02/2016  . Alcohol abuse   . Bronchitis due to tobacco use ([place])   . Cancer ([place])    Lung  . COPD (chronic obstructive pulmonary disease) ([place])   . Dehydration 06/04/2016  . Encounter for antineoplastic chemotherapy 05/02/2016  . Gastritis   . Goals of care, counseling/discussion 05/02/2016  . Hematemesis   . HTN (hypertension) 10/30/2016  . Recurrent upper respiratory infection (URI)   . Seizures ([place]) 05/2011   new onset  . Shortness of breath     Allergies No Known Allergies  IV Location/Drains/Wounds Patient Lines/Drains/Airways Status   Active Line/Drains/Airways    Name:   Placement date:   Placement time:   Site:   Days:   Implanted Port 05/13/16 Right Chest   05/13/16    -    Chest   388   Peripheral IV 06/05/17 Right Forearm   06/05/17    1148    Forearm   less than 1          Labs/Imaging Results for orders placed or performed during the hospital encounter of 06/05/17 (from the past 48 hour(s))  Strep pneumoniae urinary antigen     Status: None   Collection Time: 06/05/17  8:03 AM  Result Value Ref Range   Strep Pneumo Urinary Antigen NEGATIVE NEGATIVE    Comment:        Infection due to S. pneumoniae cannot be absolutely ruled out since the  antigen present may be below the detection limit of the test. Performed at [facility], [place] [address] [place], [place] [address]   CBC with Differential/Platelet     Status: Abnormal   Collection Time: 06/05/17 10:59 AM  Result Value Ref Range   WBC 8.6 4.0 - 10.5 K/uL   RBC 2.75 (L) 4.22 - 5.81 MIL/uL   Hemoglobin 9.2 (L) 13.0 - 17.0 g/dL   HCT 28.5 (L) 39.0 - 52.0 %   MCV 103.6 (H) 78.0 - 100.0 fL   MCH 33.5 26.0 - 34.0 pg   MCHC 32.3 30.0 - 36.0 g/dL   RDW 17.6 (H) 11.5 - 15.5 %   Platelets 184 150 - 400 K/uL   Neutrophils Relative % 88 %   Neutro Abs 7.5 1.7 - 7.7 K/uL   Lymphocytes Relative 6 %   Lymphs Abs 0.6 (L) 0.7 - 4.0 K/uL    Monocytes Relative 6 %   Monocytes Absolute 0.5 0.1 - 1.0 K/uL   Eosinophils Relative 0 %   Eosinophils Absolute 0.0 0.0 - 0.7 K/uL   Basophils Relative 0 %   Basophils Absolute 0.0 0.0 - 0.1 K/uL    Comment: Performed at [facility], [place] [address] [place], [place] [address]  Comprehensive metabolic panel     Status: Abnormal   Collection Time: 06/05/17 10:59 AM  Result Value Ref Range   Sodium 145 135 - 145 mmol/L   Potassium 4.0 3.5 - 5.1 mmol/L   Chloride 103 101 - 111 mmol/L   CO2 32 22 - 32 mmol/L   Glucose, Bld 109 (H) 65 - 99 mg/dL   [name] 36 (H) 6 - 20 mg/dL   Creatinine, Ser 1.38 (H) 0.61 - 1.24 mg/dL   Calcium 9.5 8.9 - 10.3 mg/dL   Total Protein 6.1 (L) 6.5 - 8.1 g/dL   Albumin 2.9 (L) 3.5 - 5.0 g/dL   AST 28 15 - 41 U/L   ALT 23 17 - 63 U/L   Alkaline Phosphatase 88 38 - 126 U/L   Total Bilirubin 0.6 0.3 - 1.2 mg/dL   GFR calc non Af Amer 55 (L) >60 mL/min   GFR calc Af Amer >60 >60 mL/min    Comment: (NOTE) The eGFR has been calculated using the CKD EPI equation. This calculation has not been validated in all clinical situations. eGFR's persistently <60 mL/min signify possible Chronic Kidney Disease.    Anion gap 10 5 - 15    Comment: Performed at [facility], [place] [address], [place], [place] [address]   Dg Chest 2 View  Result Date: 06/05/2017 CLINICAL DATA:  Fever.  Increased shortness of breath. EXAM: CHEST - 2 VIEW COMPARISON:  05/28/2017.  CT 03/01/2017 FINDINGS: PowerPort catheter noted with tip over cavoatrial junction. Heart size normal. Mild right upper lobe and right lower lobe and or asymmetric edema. Persistent left upper lobe density unchanged from prior CT of 03/01/2017, reference is made to that report. Bilateral pleuroparenchymal thickening again noted consistent scarring. Bilateral pleural effusions, right side greater than left. Similar findings noted on prior exam. Effusion. Heart size normal. No acute bony  abnormality. IMPRESSION: 1. Right upper lobe and right lower lobe mild infiltrates and/or asymmetric edema. Bilateral pleural effusions, right side greater than left. 2. Persistent left upper lobe density as noted on prior CT of 03/01/2017. No interim change. Reference is made to prior CT report. 3.  PowerPort catheter in stable position. Electronically Signed  By: [place]   On: 06/05/2017 07:28    Pending Labs Unresulted Labs (From admission, onward)   Start     Ordered   06/05/17 0803  Culture, blood (routine x 2) Call MD if unable to obtain prior to antibiotics being given  BLOOD CULTURE X 2,   R    Comments:  If blood cultures drawn in Emergency Department - Do not draw and cancel order    06/05/17 0804   06/05/17 0803  Gram stain  Once,   R     06/05/17 0804   Signed and Held  Basic metabolic panel  Tomorrow morning,   R     Signed and Held   Signed and Held  CBC  Tomorrow morning,   R     Signed and Held   Signed and Held  Brain natriuretic peptide  Once,   R     Signed and Held   Signed and Held  Procalcitonin - Baseline  STAT,   STAT     Signed and Held   Signed and Held  Procalcitonin  Daily,   R     Signed and Held      Vitals/Pain Today's Vitals   06/05/17 1000 06/05/17 1030 06/05/17 1100 06/05/17 1223  BP: 124/68 121/73 126/66   Pulse: 64 65 68   Resp: _0 Temp:      TempSrc:      SpO2: 100% 100% 99% 99%  PainSc:        Isolation Precautions No active isolations  Medications Medications  ceFEPIme (MAXIPIME) 1 g in sodium chloride 0.9 % 100 mL IVPB (0 g Intravenous Stopped 06/05/17 1452)  albuterol (PROVENTIL,VENTOLIN) solution continuous neb (10 mg/hr Nebulization New Bag/Given 06/05/17 1222)  vancomycin (VANCOCIN) IVPB 750 mg/150 ml premix (has no administration in time range)  vancomycin (VANCOCIN) IVPB 1000 mg/200 mL premix (0 mg Intravenous Stopped 06/05/17 1048)    Mobility walks

## 2017-06-05 NOTE — ED Provider Notes (Signed)
[facility] DEPT Provider Note   CSN: [id number] Arrival date & time: 06/05/17  0546     History   Chief Complaint Chief Complaint  Patient presents with  . Shortness of Breath    HPI MARKIES MOWATT is a 57 y.o. male.  The history is provided by the patient. No language interpreter was used.  Shortness of Breath  This is a new problem. The average episode lasts 1 day. The current episode started less than 1 hour ago. The problem has been gradually worsening. Associated symptoms include a fever and sputum production. He has tried nothing for the symptoms. The treatment provided no relief. He has had prior hospitalizations. He has had prior ED visits. Associated medical issues include chronic lung disease.  Pt reports he could not catch his breath this am.  Pt reports he improved with breathing treatments by EMS. Pt was recently in the hospital for pneumonia  Past Medical History:  Diagnosis Date  . Abdominal pain 06/04/2016  . Adenocarcinoma of left lung, stage 4 ([place]) 05/02/2016  . Alcohol abuse   . Bronchitis due to tobacco use ([place])   . Cancer ([place])    Lung  . COPD (chronic obstructive pulmonary disease) ([place])   . Dehydration 06/04/2016  . Encounter for antineoplastic chemotherapy 05/02/2016  . Gastritis   . Goals of care, counseling/discussion 05/02/2016  . Hematemesis   . HTN (hypertension) 10/30/2016  . Recurrent upper respiratory infection (URI)   . Seizures ([place]) 05/2011   new onset  . Shortness of breath     Patient Active Problem List   Diagnosis Date Noted  . Acute respiratory failure ([place]) 05/28/2017  . Acute respiratory infection 05/28/2017  . Chronic respiratory failure with hypoxia ([place]) 04/01/2017  . Leucocytosis 03/16/2017  . CKD (chronic kidney disease), stage III ([place]) 03/12/2017  . Malnutrition of moderate degree 03/10/2017  . COPD with acute exacerbation ([place]) 03/09/2017  . Anemia 02/07/2017  . Severe malnutrition  ([place]) 01/03/2017  . DOE (dyspnea on exertion) 12/30/2016  . Nausea   . Hypervolemia   . HCAP (healthcare-associated pneumonia) 12/19/2016  . Dyspnea and respiratory abnormality 12/19/2016  . Erosive gastropathy 12/18/2016  . Gastritis 12/18/2016  . Hematemesis 12/16/2016  . Intractable nausea and vomiting 12/16/2016  . HTN (hypertension) 10/30/2016  . Chronic obstructive pulmonary disease ([place]) 09/02/2016  . COPD (chronic obstructive pulmonary disease) ([place]) 08/19/2016  . Dehydration 06/04/2016  . Abdominal pain 06/04/2016  . 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 Adenocarcinoma of left lung, stage 4 ([place]) 05/02/2016  . Goals of care, counseling/discussion 05/02/2016  . Encounter for antineoplastic chemotherapy 05/02/2016  . Tobacco abuse 05/30/2011  . Alcohol abuse 05/30/2011  . Seizure ([place]) 05/28/2011    Past Surgical History:  Procedure Laterality Date  . ESOPHAGOGASTRODUODENOSCOPY (EGD) WITH PROPOFOL N/A 12/17/2016   Procedure: ESOPHAGOGASTRODUODENOSCOPY (EGD) WITH PROPOFOL;  Surgeon: Ladene Artist, MD;  Location: [facility] ENDOSCOPY;  Service: Endoscopy;  Laterality: N/A;  . IR FLUORO GUIDE PORT INSERTION RIGHT  05/13/2016  . IR US GUIDE VASC ACCESS RIGHT  05/13/2016  . NO PAST SURGERIES          Home Medications    Prior to Admission medications   Medication Sig Start Date End Date Taking? Authorizing Provider  acetaminophen (TYLENOL) 500 MG tablet Take 500 mg by mouth 2 (two) times daily as needed for mild pain or headache.   Yes [provider]  albuterol (PROVENTIL) (2.5 MG/3ML) 0.083% nebulizer solution 1 neb every  4-6 hours as needed for wheezing and shortness of breath Patient taking differently: Take 2.5 mg by nebulization every 4 (four) hours as needed for wheezing or shortness of breath.  04/01/17  Yes Parrett, Tammy S, NP  amLODipine (NORVASC) 10 MG tablet Take 10 mg by mouth daily.   Yes [provider]  dexamethasone (DECADRON) 4 MG tablet Take  4 mg by mouth See admin instructions. Take 1 tablet twice daily the day before, the day of and the day after chemotherapy every 3 weeks.    Yes [provider]  escitalopram (LEXAPRO) 5 MG tablet Take 1 tablet (5 mg total) by mouth at bedtime. 04/17/17  Yes Rosita Fire, MD  feeding supplement, ENSURE ENLIVE, (ENSURE ENLIVE) LIQD Take 237 mLs by mouth 3 (three) times daily between meals. 03/19/17  Yes Shelly Coss, MD  fluconazole (DIFLUCAN) 100 MG tablet Take 1 tablet (100 mg total) by mouth daily. Patient taking differently: Take 100 mg by mouth daily. Continuous 03/20/17  Yes Adhikari, Tamsen Meek, MD  Fluticasone-Umeclidin-Vilant (TRELEGY ELLIPTA) 100-62.5-25 MCG/INH AEPB Inhale 1 puff into the lungs daily. 05/31/17  Yes Aline August, MD  furosemide (LASIX) 40 MG tablet Take 0.5 tablets (20 mg total) by mouth daily. 05/31/17  Yes Aline August, MD  guaiFENesin (MUCINEX) 600 MG 12 hr tablet Take 1 tablet (600 mg total) by mouth 2 (two) times daily. 05/31/17  Yes Aline August, MD  guaiFENesin-dextromethorphan (ROBITUSSIN DM) 100-10 MG/5ML syrup Take 5 mLs by mouth every 4 (four) hours as needed for cough. 05/31/17  Yes Aline August, MD  Ipratropium-Albuterol (COMBIVENT) 20-100 MCG/ACT AERS respimat Inhale 1 puff into the lungs every 6 (six) hours. 05/31/17  Yes Aline August, MD  loratadine (CLARITIN) 10 MG tablet Take 10 mg by mouth daily.   Yes [provider]  metoprolol tartrate (LOPRESSOR) 25 MG tablet Take 25 mg by mouth 2 (two) times daily.   Yes [provider]  ondansetron (ZOFRAN-ODT) 4 MG disintegrating tablet Take 4 mg by mouth every 8 (eight) hours as needed for nausea or vomiting.   Yes [provider]  Oxycodone HCl 10 MG TABS Take 10 mg by mouth every 4 (four) hours as needed (pain).   Yes [provider]  pantoprazole (PROTONIX) 40 MG tablet Take 1 tablet (40 mg total) by mouth 2 (two) times daily. 12/18/16  Yes Eugenie Filler,  MD  senna-docusate (SENNA-PLUS) 8.6-50 MG tablet Take 1 tablet by mouth at bedtime.   Yes [provider]  Chlorphen-Phenyleph-ASA (ALKA-SELTZER PLUS COLD PO) Take 2 tablets by mouth at bedtime as needed (COUGH).    [provider]  Oxycodone HCl 10 MG TABS Take 1 tablet (10 mg total) by mouth every 4 (four) hours as needed (for pain/shortness of breath). Patient not taking: Reported on 06/03/2017 05/31/17   Aline August, MD  polyethylene glycol (MIRALAX / GLYCOLAX) packet Take 17 g by mouth daily as needed for mild constipation. 03/19/17   Shelly Coss, MD  predniSONE (DELTASONE) 20 MG tablet Take 2 tablets (40 mg total) by mouth daily for 7 days. Patient not taking: Reported on 06/05/2017 05/31/17 06/07/17  Aline August, MD    Family History Family History  Problem Relation Age of Onset  . Cancer Father   . Diabetes Mellitus II Mother     Social History Social History   Tobacco Use  . Smoking status: Current Some Day Smoker    Packs/day: 0.10    Years: 30.00    Pack years:  3.00    Types: Cigarettes  . Smokeless tobacco: Never Used  . Tobacco comment: 1-2 cig a day   Substance Use Topics  . Alcohol use: No    Frequency: Never  . Drug use: No     Allergies   Patient has no known allergies.   Review of Systems Review of Systems  Constitutional: Positive for fever.  Respiratory: Positive for sputum production and shortness of breath.   All other systems reviewed and are negative.    Physical Exam Updated Vital Signs BP 126/66   Pulse 68   Temp 97.8 F (36.6 C) (Oral)   Resp 18   SpO2 99%   Physical Exam  Constitutional: He appears well-developed and well-nourished.  HENT:  Head: Normocephalic and atraumatic.  Mouth/Throat: Oropharynx is clear and moist.  Eyes: Pupils are equal, round, and reactive to light. Conjunctivae are normal.  Neck: Neck supple.  Cardiovascular: Normal rate and regular rhythm.  No murmur heard. Pulmonary/Chest:  Effort normal. No respiratory distress. He has wheezes. He has rhonchi.  Abdominal: Soft. There is no tenderness.  Musculoskeletal: Normal range of motion. He exhibits no edema.  Neurological: He is alert.  Skin: Skin is warm and dry.  Psychiatric: He has a normal mood and affect.  Nursing note and vitals reviewed.    ED Treatments / Results  Labs (all labs ordered are listed, but only abnormal results are displayed) Labs Reviewed  CBC WITH DIFFERENTIAL/PLATELET - Abnormal; Notable for the following components:      Result Value   RBC 2.75 (*)    Hemoglobin 9.2 (*)    HCT 28.5 (*)    MCV 103.6 (*)    RDW 17.6 (*)    Lymphs Abs 0.6 (*)    All other components within normal limits  COMPREHENSIVE METABOLIC PANEL - Abnormal; Notable for the following components:   Glucose, Bld 109 (*)    BUN 36 (*)    Creatinine, Ser 1.38 (*)    Total Protein 6.1 (*)    Albumin 2.9 (*)    GFR calc non Af Amer 55 (*)    All other components within normal limits  CULTURE, BLOOD (ROUTINE X 2)  CULTURE, BLOOD (ROUTINE X 2)  GRAM STAIN  STREP PNEUMONIAE URINARY ANTIGEN    EKG None  Radiology Dg Chest 2 View  Result Date: 06/05/2017 CLINICAL DATA:  Fever.  Increased shortness of breath. EXAM: CHEST - 2 VIEW COMPARISON:  05/28/2017.  CT 03/01/2017 FINDINGS: PowerPort catheter noted with tip over cavoatrial junction. Heart size normal. Mild right upper lobe and right lower lobe and or asymmetric edema. Persistent left upper lobe density unchanged from prior CT of 03/01/2017, reference is made to that report. Bilateral pleuroparenchymal thickening again noted consistent scarring. Bilateral pleural effusions, right side greater than left. Similar findings noted on prior exam. Effusion. Heart size normal. No acute bony abnormality. IMPRESSION: 1. Right upper lobe and right lower lobe mild infiltrates and/or asymmetric edema. Bilateral pleural effusions, right side greater than left. 2. Persistent left  upper lobe density as noted on prior CT of 03/01/2017. No interim change. Reference is made to prior CT report. 3.  PowerPort catheter in stable position. Electronically Signed   By: Marcello Moores  Register   On: 06/05/2017 07:28    Procedures Procedures (including critical care time)  Medications Ordered in ED Medications  ceFEPIme (MAXIPIME) 1 g in sodium chloride 0.9 % 100 mL IVPB (1 g Intravenous New Bag/Given 06/05/17 1055)  vancomycin (VANCOCIN)  IVPB 1000 mg/200 mL premix (0 mg Intravenous Stopped 06/05/17 1048)     Initial Impression / Assessment and Plan / ED Course  I have reviewed the triage vital signs and the nursing notes.  Pertinent labs & imaging results that were available during my care of the patient were reviewed by me and considered in my medical decision making (see chart for details).    MDM Pt given IV fluids and antibiotics.  Pt reports he is feeling better after treatments.    (Blood cultures not obtained, RN reports cultures could not be obtained)   Final Clinical Impressions(s) / ED Diagnoses   Final diagnoses:  HCAP (healthcare-associated pneumonia)  SOB (shortness of breath)    ED Discharge Orders    None    I spoke to Hospitalist who will admit    Sidney Ace 06/05/17 1255    Charlesetta Shanks, MD 06/12/17 [phone]

## 2017-06-05 NOTE — ED Notes (Signed)
UNSUCCESSFUL LAB COLLECTION DUE TO KURIN DEVISE FAILURE

## 2017-06-05 NOTE — ED Triage Notes (Signed)
Patient BIB EMS from home with complaints of shortness of breath that began Tuesday after Chemotherapy and has gotten progressively worse since Tuesday. Patient has stage IV lung cancer and recently had fluid drained off of his right lung. Patient on 2.5 L [place] at baseline. Patient noted to be wheezing with bubbling on the right side per EMS. Patient given duoneb en route with 10 mg albuterol and 0.5 mg atrovent. Patient reports mild improvement.

## 2017-06-06 ENCOUNTER — Observation Stay (HOSPITAL_COMMUNITY): Payer: Medicaid Other

## 2017-06-06 DIAGNOSIS — K5903 Drug induced constipation: Secondary | ICD-10-CM | POA: Diagnosis present

## 2017-06-06 DIAGNOSIS — F419 Anxiety disorder, unspecified: Secondary | ICD-10-CM | POA: Diagnosis present

## 2017-06-06 DIAGNOSIS — N183 Chronic kidney disease, stage 3 (moderate): Secondary | ICD-10-CM

## 2017-06-06 DIAGNOSIS — R0602 Shortness of breath: Secondary | ICD-10-CM

## 2017-06-06 DIAGNOSIS — R52 Pain, unspecified: Secondary | ICD-10-CM

## 2017-06-06 DIAGNOSIS — C3492 Malignant neoplasm of unspecified part of left bronchus or lung: Secondary | ICD-10-CM | POA: Diagnosis present

## 2017-06-06 DIAGNOSIS — I130 Hypertensive heart and chronic kidney disease with heart failure and stage 1 through stage 4 chronic kidney disease, or unspecified chronic kidney disease: Secondary | ICD-10-CM | POA: Diagnosis present

## 2017-06-06 DIAGNOSIS — I10 Essential (primary) hypertension: Secondary | ICD-10-CM | POA: Diagnosis not present

## 2017-06-06 DIAGNOSIS — K2970 Gastritis, unspecified, without bleeding: Secondary | ICD-10-CM | POA: Diagnosis present

## 2017-06-06 DIAGNOSIS — J440 Chronic obstructive pulmonary disease with acute lower respiratory infection: Secondary | ICD-10-CM | POA: Diagnosis present

## 2017-06-06 DIAGNOSIS — F329 Major depressive disorder, single episode, unspecified: Secondary | ICD-10-CM | POA: Diagnosis present

## 2017-06-06 DIAGNOSIS — Z7951 Long term (current) use of inhaled steroids: Secondary | ICD-10-CM | POA: Diagnosis not present

## 2017-06-06 DIAGNOSIS — I5042 Chronic combined systolic (congestive) and diastolic (congestive) heart failure: Secondary | ICD-10-CM | POA: Diagnosis present

## 2017-06-06 DIAGNOSIS — T502X5A Adverse effect of carbonic-anhydrase inhibitors, benzothiadiazides and other diuretics, initial encounter: Secondary | ICD-10-CM | POA: Diagnosis present

## 2017-06-06 DIAGNOSIS — C7951 Secondary malignant neoplasm of bone: Secondary | ICD-10-CM

## 2017-06-06 DIAGNOSIS — K5900 Constipation, unspecified: Secondary | ICD-10-CM | POA: Diagnosis not present

## 2017-06-06 DIAGNOSIS — Z79899 Other long term (current) drug therapy: Secondary | ICD-10-CM | POA: Diagnosis not present

## 2017-06-06 DIAGNOSIS — Z720 Tobacco use: Secondary | ICD-10-CM

## 2017-06-06 DIAGNOSIS — J449 Chronic obstructive pulmonary disease, unspecified: Secondary | ICD-10-CM | POA: Diagnosis not present

## 2017-06-06 DIAGNOSIS — T402X5A Adverse effect of other opioids, initial encounter: Secondary | ICD-10-CM | POA: Diagnosis present

## 2017-06-06 DIAGNOSIS — Y95 Nosocomial condition: Secondary | ICD-10-CM | POA: Diagnosis present

## 2017-06-06 DIAGNOSIS — E870 Hyperosmolality and hypernatremia: Secondary | ICD-10-CM | POA: Diagnosis present

## 2017-06-06 DIAGNOSIS — J189 Pneumonia, unspecified organism: Principal | ICD-10-CM

## 2017-06-06 DIAGNOSIS — J441 Chronic obstructive pulmonary disease with (acute) exacerbation: Secondary | ICD-10-CM | POA: Diagnosis present

## 2017-06-06 DIAGNOSIS — G8929 Other chronic pain: Secondary | ICD-10-CM | POA: Diagnosis present

## 2017-06-06 DIAGNOSIS — Z9981 Dependence on supplemental oxygen: Secondary | ICD-10-CM | POA: Diagnosis not present

## 2017-06-06 LAB — BASIC METABOLIC PANEL WITH GFR
BUN: 31 mg/dL — ABNORMAL HIGH (ref 6–20)
GFR calc Af Amer: 60 mL/min — ABNORMAL LOW (ref >60)
GFR calc non Af Amer: 52 mL/min — ABNORMAL LOW (ref >60)
Sodium: 147 mmol/L — ABNORMAL HIGH (ref 135–145)

## 2017-06-06 LAB — CBC
HCT: 29 % — ABNORMAL LOW (ref 39.0–52.0)
Hemoglobin: 9 g/dL — ABNORMAL LOW (ref 13.0–17.0)
MCH: 32.7 pg (ref 26.0–34.0)
MCHC: 31 g/dL (ref 30.0–36.0)
MCV: 105.5 fL — ABNORMAL HIGH (ref 78.0–100.0)
Platelets: 174 10*3/uL (ref 150–400)
RBC: 2.75 MIL/uL — ABNORMAL LOW (ref 4.22–5.81)
RDW: 17.9 % — ABNORMAL HIGH (ref 11.5–15.5)
WBC: 6.4 K/uL (ref 4.0–10.5)

## 2017-06-06 LAB — BASIC METABOLIC PANEL
Anion gap: 11 (ref 5–15)
CO2: 36 mmol/L — ABNORMAL HIGH (ref 22–32)
Calcium: 9.1 mg/dL (ref 8.9–10.3)
Chloride: 100 mmol/L — ABNORMAL LOW (ref 101–111)
Creatinine, Ser: 1.46 mg/dL — ABNORMAL HIGH (ref 0.61–1.24)
Glucose, Bld: 96 mg/dL (ref 65–99)
Potassium: 3.6 mmol/L (ref 3.5–5.1)

## 2017-06-06 LAB — PROCALCITONIN: Procalcitonin: 0.28 ng/mL

## 2017-06-06 MED ORDER — SODIUM CHLORIDE 0.9 % IV SOLN
2.0000 g | INTRAVENOUS | Status: DC
  Administered 2017-06-07 – 2017-06-08 (×2): 2 g via INTRAVENOUS
  Filled 2017-06-06 (×3): qty 2

## 2017-06-06 MED ORDER — POLYETHYLENE GLYCOL 3350 17 G PO PACK
17.0000 g | PACK | Freq: Two times a day (BID) | ORAL | Status: DC
  Administered 2017-06-06 – 2017-06-10 (×8): 17 g via ORAL
  Filled 2017-06-06 (×8): qty 1

## 2017-06-06 MED ORDER — MINERAL OIL RE ENEM
1.0000 | ENEMA | Freq: Once | RECTAL | Status: DC
  Filled 2017-06-06: qty 1

## 2017-06-06 MED ORDER — BUDESONIDE 0.5 MG/2ML IN SUSP
0.5000 mg | Freq: Two times a day (BID) | RESPIRATORY_TRACT | Status: DC
  Administered 2017-06-06 – 2017-06-10 (×8): 0.5 mg via RESPIRATORY_TRACT
  Filled 2017-06-06 (×9): qty 2

## 2017-06-06 MED ORDER — SENNOSIDES-DOCUSATE SODIUM 8.6-50 MG PO TABS
1.0000 | ORAL_TABLET | Freq: Two times a day (BID) | ORAL | Status: DC
  Administered 2017-06-06 – 2017-06-10 (×9): 1 via ORAL
  Filled 2017-06-06 (×9): qty 1

## 2017-06-06 MED ORDER — GUAIFENESIN-DM 100-10 MG/5ML PO SYRP
5.0000 mL | ORAL_SOLUTION | ORAL | Status: DC | PRN
Start: 2017-06-06 — End: 2017-06-10
  Administered 2017-06-06: 5 mL via ORAL
  Filled 2017-06-06: qty 10

## 2017-06-06 MED ORDER — IOPAMIDOL (ISOVUE-300) INJECTION 61%
INTRAVENOUS | Status: AC
  Filled 2017-06-06: qty 100

## 2017-06-06 MED ORDER — ADULT MULTIVITAMIN W/MINERALS CH
1.0000 | ORAL_TABLET | Freq: Every day | ORAL | Status: DC
  Administered 2017-06-06 – 2017-06-10 (×5): 1 via ORAL
  Filled 2017-06-06 (×5): qty 1

## 2017-06-06 MED ORDER — METHYLPREDNISOLONE SODIUM SUCC 125 MG IJ SOLR
60.0000 mg | Freq: Two times a day (BID) | INTRAMUSCULAR | Status: DC
  Administered 2017-06-06 – 2017-06-07 (×2): 60 mg via INTRAVENOUS
  Filled 2017-06-06 (×2): qty 2

## 2017-06-06 MED ORDER — FUROSEMIDE 40 MG PO TABS
40.0000 mg | ORAL_TABLET | Freq: Every day | ORAL | Status: DC
  Administered 2017-06-06: 40 mg via ORAL
  Filled 2017-06-06: qty 1

## 2017-06-06 MED ORDER — GUAIFENESIN ER 600 MG PO TB12
1200.0000 mg | ORAL_TABLET | Freq: Two times a day (BID) | ORAL | Status: DC
  Administered 2017-06-06 – 2017-06-10 (×8): 1200 mg via ORAL
  Filled 2017-06-06 (×8): qty 2

## 2017-06-06 NOTE — Progress Notes (Signed)
Initial Nutrition Assessment  DOCUMENTATION CODES:   Underweight  INTERVENTION:  Continue Ensure Enlive po TID, each supplement provides 350 kcal and 20 grams of protein  NUTRITION DIAGNOSIS:   Increased nutrient needs related to cancer and cancer related treatments as evidenced by estimated needs.  GOAL:   Patient will meet greater than or equal to 90% of their needs  MONITOR:   PO intake, Supplement acceptance, Labs, Skin, I & O's, Weight trends  REASON FOR ASSESSMENT:   Consult Assessment of nutrition requirement/status  ASSESSMENT:   57 y.o. M admitted on 06/05/17 for SOB and COPD exacerbation on home O2 (2L). Pt with current stg IV lung cancer s/p RT, currently on chemo (last treatment 06/03/17). PMH of htn, chronic pain, CHF, stg III CKD, EtOH abuse, COPD, gastritis, recurrent URI, and seizures.    Pt with past malnutrition diagnoses which was improving.   Pt reports appetite is currently poor and only drank ensure thins morning - did not eat breakfast; chart shows that pt was not given ensure this morning. Pt did not give nutrition or weight history and refused NFPE.   Suspect ongoing malnutrition due to past diagnoses; unable to document at this time due to pt refusal of NFPE and no current nutrition or weight history.   Medications reviewed: zithromax, lexapro, ensure enlive BID, lasix, diflucan, lopressor, MVI with minerals, protonix, senokot, vancocin.   Labs reviewed: BG 109 (H), BUN 36 (H), creatinine 1.38 (H), GFR 55 (L).   NUTRITION - FOCUSED PHYSICAL EXAM:  Unable to assess; pt refusal  Diet Order:   Diet Order           Diet regular Room service appropriate? Yes; Fluid consistency: Thin; Fluid restriction: 1200 mL Fluid  Diet effective now          EDUCATION NEEDS:   No education needs have been identified at this time  Skin:  Skin Assessment: Reviewed RN Assessment  Last BM:  06/05/17  Height:   Ht Readings from Last 1 Encounters:   06/05/17 6' (1.829 m)    Weight:   Wt Readings from Last 1 Encounters:  06/05/17 122 lb 2.2 oz (55.4 kg)   Pt weight was 120 lbs 1 month ago, now up 2 lbs.   Ideal Body Weight:  80.9 kg  BMI:  Body mass index is 16.56 kg/m.  Estimated Nutritional Needs:   Kcal:  1800-2000 kcal  Protein:  85-100 grams  Fluid:  >/= 1.8 L or per MD  Hope Budds, Dietetic Intern

## 2017-06-06 NOTE — Progress Notes (Signed)
Pt refuses miralax and enema. Pt was educated on results of abdominal xray and still refused.

## 2017-06-06 NOTE — Progress Notes (Signed)
Mr Nedved refused the MRSA swab. He agreed to obtain a urins specimen

## 2017-06-06 NOTE — Progress Notes (Signed)
PHARMACY NOTE:  ANTIMICROBIAL RENAL DOSAGE ADJUSTMENT  Current antimicrobial regimen includes a mismatch between antimicrobial dosage and estimated renal function.  As per policy approved by the Pharmacy & Therapeutics and Medical Executive Committees, the antimicrobial dosage will be adjusted accordingly.  Current antimicrobial dosage:  Cefepime 1gm IV q8h  Indication: PNA  Renal Function:  Estimated Creatinine Clearance: 43.7 mL/min (A) (by C-G formula based on SCr of 1.46 mg/dL (H)). []      On intermittent HD, scheduled: []      On CRRT    Antimicrobial dosage has been changed to:  cefepime 2gm IV q24h    Thank you for allowing pharmacy to be a part of this patient's care.  Lynelle Doctor, [facility] 06/06/2017 8:31 AM

## 2017-06-06 NOTE — Progress Notes (Signed)
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  Patient recently admitted from 05/28/2017 to 05/31/2017 for shortness of breath felt secondary to pleural effusion status post ultrasound-guided thoracentesis, acute COPD exacerbation and discharged home on prednisone.  Patient presents back with orthopnea, some increasing lower extremity edema.  Chest x-ray worrisome for asymmetric edema versus pneumonia.  Patient also with a productive cough with purulent sputum and subjective fevers. Patient placed empirically on IV vancomycin and IV cefepime for presumed healthcare associated pneumonia, scheduled nebs, IV Lasix.   Assessment & Plan:   Principal Problem:   SOB (shortness of breath) Active Problems:   HCAP (healthcare-associated pneumonia)   Tobacco abuse   Adenocarcinoma of left lung, stage 4 (HCC)   Spine metastasis (HCC)   COPD (chronic obstructive pulmonary disease) (HCC)   HTN (hypertension)   CKD (chronic kidney disease), stage III (HCC)  #1 shortness of breath secondary to probable healthcare associated pneumonia/mild COPD exacerbation. Likely secondary to probable healthcare associated pneumonia, mild COPD exacerbation.  Doubt if patient diastolic heart failure as BNP within normal limits.  Patient was placed on IV Lasix and did diurese 1.9 L over the past 24 hours.  Chest x-ray with right-sided infiltrates concerning for possible pneumonia versus asymmetric edema.  Patient denies any significant improvement since admission.  Urine strep pneumococcus antigen negative.  Urine Legionella antigen pending.  Blood cultures  pending.  Sputum Gram stain and culture pending.  Placed on Mucinex twice daily.  Increase Pulmicort to 0.5 mg twice daily.  Continue scheduled nebs.  Add IV Solu-Medrol 60 mg every 12 hours.  Change IV Lasix to oral Lasix.  Continue empiric IV antibiotics of vancomycin and cefepime and azithromycin.  Supportive care.  2.  Mild COPD exacerbation Patient O2 dependent.  Patient with some wheezing noted on examination and had presented with shortness of breath with purulent sputum.  Continue scheduled nebs increase Pulmicort to 0.5 mg twice daily. Claritin.  IV Solu-Medrol 60 mg every 12 hours.  Continue empiric IV antibiotics.  Follow.  3.  Tobacco abuse Tobacco cessation.  4.  Abdominal pain/epigastric pain Likely secondary to probable constipation as patient on chronic pain medications.  Concern for also gastritis.  Get abdominal film.  Continue PPI.  Place on MiraLAX twice daily and Senokot-S twice daily.  Pending abdominal films may need an enema.  5.  Chronic combined grade 2 diastolic dysfunction and systolic dysfunction On admission due to concerns for volume overload patient was placed on Lasix 40 mg IV every 12 hours.  BNP is less than 100.  Patient also with decreased albumin which may be leading to his edema.  Chest x-ray with asymmetric infiltrates more likely to be pneumonia versus edema.  Patient however diuresis with a urine output of 1.9 L since admission yesterday.  Sodium level rising.  Discontinue IV Lasix and placed on oral Lasix 40 mg daily.  Continue beta-blocker.  Follow.  6.  Hypertension Continue metoprolol, Norvasc.  7.  Chronic kidney disease stage III Stable.  8.  Chronic pain Continue home regimen of oxycodone.   9 depression/anxiety Continue Lexapro.  10.  Hypernatremia Likely secondary to diuresis.  Discontinue IV Lasix and placed on oral Lasix.  Follow.  11 stage IV lung cancer Patient follow-up with oncology.  Oncology notified of admission via  epic.    DVT prophylaxis: Lovenox Code Status: Full Family Communication: updated patient.  No family at bedside. Disposition Plan: To home once clinically improved and medically stable.   Consultants:   None  Procedures:   Chest x-ray 06/05/2017    Antimicrobials:  IV vancomycin 06/05/2017  IV cefepime 06/05/2017  Azithromycin 06/05/2017   Subjective: States no significant improvement with shortness of breath since admission.  Complaining of some upper abdominal pain.  Objective: Vitals:   06/05/17 1603 06/05/17 1937 06/05/17 2129 06/06/17 0838  BP:   116/71   Pulse:   (!) 102   Resp:   17   Temp:   98.9 F (37.2 C)   TempSrc:   Axillary   SpO2:  98% 99% 100%  Weight: 55.4 kg (122 lb 2.2 oz)     Height: 6' (1.829 m)       Intake/Output Summary (Last 24 hours) at 06/06/2017 1109 Last data filed at 06/06/2017 0145 Gross per 24 hour  Intake 220 ml  Output 1900 ml  Net -1680 ml   Filed Weights   06/05/17 1603  Weight: 55.4 kg (122 lb 2.2 oz)    Examination:  General exam: Appears calm and comfortable  Respiratory system: Coarse breath sounds/crackles right lungs greater than left.  Minimal expiratory wheezing.  Respiratory effort normal. Cardiovascular system: S1 & S2 heard, RRR. No JVD, murmurs, rubs, gallops or clicks. No pedal edema. Gastrointestinal system: Abdomen is mildly distended, some upper abd pain palpation. Positive BS.  No rebound.  No guarding. Central nervous system: Alert and oriented. No focal neurological deficits. Extremities: Symmetric 5 x 5 power. Skin: No rashes, lesions or ulcers Psychiatry: Judgement and insight appear normal. Mood & affect appropriate.     Data Reviewed: I have personally reviewed following labs and imaging studies  CBC: Recent Labs  Lab 05/31/17 0342 06/03/17 1021 06/05/17 1059 06/06/17 0530  WBC 10.0 8.0 8.6 6.4  NEUTROABS 9.5* 7.4* 7.5  --   HGB 7.6* 9.7* 9.2* 9.0*  HCT 23.4* 29.3* 28.5* 29.0*   MCV 101.7* 101.8* 103.6* 105.5*  PLT 225 242 184 382   Basic Metabolic Panel: Recent Labs  Lab 05/31/17 0342 06/03/17 1021 06/05/17 1059 06/06/17 0530  NA 140 139 145 147*  K 4.4 4.3 4.0 3.6  CL 102 100 103 100*  CO2 28 32* 32 36*  GLUCOSE 153* 173* 109* 96  BUN 42* 39* 36* 31*  CREATININE 2.00* 1.57* 1.38* 1.46*  CALCIUM 9.5 9.4 9.5 9.1  MG 1.9  --   --   --    GFR: Estimated Creatinine Clearance: 43.7 mL/min (A) (by C-G formula based on SCr of 1.46 mg/dL (H)). Liver Function Tests: Recent Labs  Lab 06/03/17 1021 06/05/17 1059  AST 23 28  ALT 20 23  ALKPHOS 113 88  BILITOT 0.2 0.6  PROT 6.5 6.1*  ALBUMIN 3.2* 2.9*   No results for input(s): LIPASE, AMYLASE in the last 168 hours. No results for input(s): AMMONIA in the last 168 hours. Coagulation Profile: No results for input(s): INR, PROTIME in the last 168 hours. Cardiac Enzymes: No results for input(s): CKTOTAL, CKMB, CKMBINDEX, TROPONINI in the last 168 hours. BNP (last 3 results) No results for input(s): PROBNP in the last 8760 hours. HbA1C: No results for input(s): HGBA1C in the last 72 hours. CBG: Recent  Labs  Lab 05/30/17 1159 05/30/17 1708 05/31/17 0832 05/31/17 1146  GLUCAP 233* 191* 205* 177*   Lipid Profile: No results for input(s): CHOL, HDL, LDLCALC, TRIG, CHOLHDL, LDLDIRECT in the last 72 hours. Thyroid Function Tests: No results for input(s): TSH, T4TOTAL, FREET4, T3FREE, THYROIDAB in the last 72 hours. Anemia Panel: No results for input(s): VITAMINB12, FOLATE, FERRITIN, TIBC, IRON, RETICCTPCT in the last 72 hours. Sepsis Labs: Recent Labs  Lab 06/05/17 1755 06/06/17 0530  PROCALCITON 0.11 0.28    Recent Results (from the past 240 hour(s))  Body fluid culture     Status: None   Collection Time: 05/28/17 12:29 PM  Result Value Ref Range Status   Specimen Description   Final    PLEURAL RIGHT Performed at [facility], [place] [address], [place], [place]  [address]    Special Requests   Final    NONE Performed at [facility], [place] [address], [place], [place] [address]    Gram Stain   Final    ABUNDANT WBC PRESENT,BOTH PMN AND MONONUCLEAR NO ORGANISMS SEEN    Culture   Final    NO GROWTH 3 DAYS Performed at [facility], [place] [address], [place], [place] [address]    Report Status 05/31/2017 FINAL  Final  MRSA PCR Screening     Status: None   Collection Time: 05/28/17  4:29 PM  Result Value Ref Range Status   MRSA by PCR NEGATIVE NEGATIVE Final    Comment:        The GeneXpert MRSA Assay (FDA approved for NASAL specimens only), is one component of a comprehensive MRSA colonization surveillance program. It is not intended to diagnose MRSA infection nor to guide or monitor treatment for MRSA infections. Performed at [facility], [place] [address], [place], [place] [address]   Culture, blood (routine x 2) Call MD if unable to obtain prior to antibiotics being given     Status: None (Preliminary result)   Collection Time: 06/05/17 10:59 AM  Result Value Ref Range Status   Specimen Description   Final    BLOOD RIGHT HAND Performed at [place] [address] [place], [place] [address]    Special Requests   Final    BOTTLES DRAWN AEROBIC AND ANAEROBIC Blood Culture adequate volume Performed at [facility] [address] [place], [place] [address]    Culture   Final    NO GROWTH < 24 HOURS Performed at [place] [address], [place], [place] [address]    Report Status PENDING  Incomplete  Culture, blood (routine x 2) Call MD if unable to obtain prior to antibiotics being given     Status: None (Preliminary result)   Collection Time: 06/05/17 11:47 AM  Result Value Ref Range Status   Specimen Description   Final    BLOOD RIGHT FOREARM Performed at [place] [address] [place], [place] [address]     Special Requests   Final    BOTTLES DRAWN AEROBIC AND ANAEROBIC Blood Culture adequate volume Performed at [place] [address] [place], [place] [address]    Culture   Final    NO GROWTH < 24 HOURS Performed at [place] [address], [place], [place] [address]    Report Status PENDING  Incomplete         Radiology Studies: Dg Chest 2 View  Result Date: 06/05/2017 CLINICAL DATA:  Fever.  Increased shortness of breath. EXAM: CHEST - 2 VIEW COMPARISON:  05/28/2017.  CT 03/01/2017 FINDINGS: PowerPort catheter noted with tip over cavoatrial junction. Heart size normal. Mild right upper lobe and right lower lobe and or asymmetric edema. Persistent left upper lobe density unchanged from prior CT of 03/01/2017, reference is made to that report. Bilateral pleuroparenchymal thickening again noted consistent scarring. Bilateral pleural effusions, right side greater than left. Similar findings noted on prior exam. Effusion. Heart size normal. No acute bony abnormality. IMPRESSION: 1. Right upper lobe and right lower lobe mild infiltrates and/or asymmetric edema. Bilateral pleural effusions, right side greater than left. 2. Persistent left upper lobe density as noted on prior CT of 03/01/2017. No interim change. Reference is made to prior CT report. 3.  PowerPort catheter in stable position. Electronically Signed   By: Marcello Moores  Register   On: 06/05/2017 07:28        Scheduled Meds: . amLODipine  10 mg Oral Daily  . azithromycin  500 mg Oral Q24H  . budesonide (PULMICORT) nebulizer solution  0.5 mg Nebulization BID  . enoxaparin (LOVENOX) injection  40 mg Subcutaneous Q24H  . escitalopram  5 mg Oral QHS  . feeding supplement (ENSURE ENLIVE)  237 mL Oral TID BM  . fluconazole  100 mg Oral Daily  . furosemide  40 mg Oral Daily  . guaiFENesin  600 mg Oral BID  . ipratropium-albuterol  3 mL Nebulization TID  . loratadine  10 mg Oral Daily  . Loteprednol  Etabonate  1 application Ophthalmic TID  . metoprolol tartrate  25 mg Oral BID  . multivitamin with minerals  1 tablet Oral Daily  . pantoprazole  40 mg Oral BID  . senna-docusate  1 tablet Oral QHS   Continuous Infusions: . [START ON 06/07/2017] ceFEPime (MAXIPIME) IV    . vancomycin Stopped (06/06/17 1035)     LOS: 0 days    Time spent: 40 minutes    Irine Seal, MD [facility] Pager [phone]  If 7PM-7AM, please contact night-coverage [email or web address] Password [email or web address] 06/06/2017, 11:09 AM

## 2017-06-07 DIAGNOSIS — K5900 Constipation, unspecified: Secondary | ICD-10-CM | POA: Diagnosis present

## 2017-06-07 DIAGNOSIS — J441 Chronic obstructive pulmonary disease with (acute) exacerbation: Secondary | ICD-10-CM

## 2017-06-07 LAB — CBC
HCT: 27.1 % — ABNORMAL LOW (ref 39.0–52.0)
Hemoglobin: 8.7 g/dL — ABNORMAL LOW (ref 13.0–17.0)
MCH: 33.6 pg (ref 26.0–34.0)
MCHC: 32.1 g/dL (ref 30.0–36.0)
MCV: 104.6 fL — ABNORMAL HIGH (ref 78.0–100.0)
PLATELETS: 156 10*3/uL (ref 150–400)
RBC: 2.59 MIL/uL — ABNORMAL LOW (ref 4.22–5.81)
RDW: 17.2 % — ABNORMAL HIGH (ref 11.5–15.5)
WBC: 8.3 10*3/uL (ref 4.0–10.5)

## 2017-06-07 LAB — BASIC METABOLIC PANEL
Anion gap: 10 (ref 5–15)
BUN: 33 mg/dL — AB (ref 6–20)
CO2: 34 mmol/L — ABNORMAL HIGH (ref 22–32)
CREATININE: 1.47 mg/dL — AB (ref 0.61–1.24)
Calcium: 9.1 mg/dL (ref 8.9–10.3)
Chloride: 93 mmol/L — ABNORMAL LOW (ref 101–111)
GFR calc Af Amer: 59 mL/min — ABNORMAL LOW (ref >60)
GFR calc non Af Amer: 51 mL/min — ABNORMAL LOW (ref >60)
Glucose, Bld: 154 mg/dL — ABNORMAL HIGH (ref 65–99)
Potassium: 4.9 mmol/L (ref 3.5–5.1)
SODIUM: 137 mmol/L (ref 135–145)

## 2017-06-07 LAB — PROCALCITONIN: Procalcitonin: 0.61 ng/mL

## 2017-06-07 MED ORDER — FUROSEMIDE 20 MG PO TABS
20.0000 mg | ORAL_TABLET | Freq: Every day | ORAL | Status: DC
  Administered 2017-06-08 – 2017-06-10 (×3): 20 mg via ORAL
  Filled 2017-06-07 (×3): qty 1

## 2017-06-07 MED ORDER — OXYCODONE HCL 5 MG PO TABS
10.0000 mg | ORAL_TABLET | ORAL | Status: DC | PRN
  Administered 2017-06-07 – 2017-06-10 (×6): 10 mg via ORAL
  Filled 2017-06-07 (×6): qty 2

## 2017-06-07 MED ORDER — FUROSEMIDE 40 MG PO TABS
40.0000 mg | ORAL_TABLET | Freq: Every day | ORAL | Status: DC
Start: 2017-06-07 — End: 2017-06-07
  Administered 2017-06-07: 40 mg via ORAL
  Filled 2017-06-07: qty 1

## 2017-06-07 MED ORDER — METHYLPREDNISOLONE SODIUM SUCC 125 MG IJ SOLR
60.0000 mg | Freq: Every day | INTRAMUSCULAR | Status: DC
  Administered 2017-06-08: 60 mg via INTRAVENOUS
  Filled 2017-06-07: qty 2

## 2017-06-07 MED ORDER — MINERAL OIL RE ENEM
1.0000 | ENEMA | Freq: Once | RECTAL | Status: AC
  Administered 2017-06-07: 1 via RECTAL
  Filled 2017-06-07: qty 1

## 2017-06-07 MED ORDER — FUROSEMIDE 20 MG PO TABS: 20.0000 mg | ORAL_TABLET | Freq: Every day | ORAL | Status: DC

## 2017-06-07 MED ORDER — METOPROLOL TARTRATE 25 MG PO TABS
12.5000 mg | ORAL_TABLET | Freq: Two times a day (BID) | ORAL | Status: DC
  Administered 2017-06-07 – 2017-06-10 (×6): 12.5 mg via ORAL
  Filled 2017-06-07 (×7): qty 1
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
## 2017-06-08 LAB — BASIC METABOLIC PANEL
ANION GAP: 11 (ref 5–15)
BUN: 37 mg/dL — ABNORMAL HIGH (ref 6–20)
CALCIUM: 9.2 mg/dL (ref 8.9–10.3)
CHLORIDE: 94 mmol/L — AB (ref 101–111)
CO2: 34 mmol/L — ABNORMAL HIGH (ref 22–32)
CREATININE: 1.4 mg/dL — AB (ref 0.61–1.24)
GFR calc non Af Amer: 54 mL/min — ABNORMAL LOW (ref >60)
Glucose, Bld: 105 mg/dL — ABNORMAL HIGH (ref 65–99)
Potassium: 3.9 mmol/L (ref 3.5–5.1)
SODIUM: 139 mmol/L (ref 135–145)

## 2017-06-08 LAB — CBC
HCT: 26.5 % — ABNORMAL LOW (ref 39.0–52.0)
HEMOGLOBIN: 8.6 g/dL — AB (ref 13.0–17.0)
MCH: 33.3 pg (ref 26.0–34.0)
MCHC: 32.5 g/dL (ref 30.0–36.0)
MCV: 102.7 fL — ABNORMAL HIGH (ref 78.0–100.0)
PLATELETS: 145 10*3/uL — AB (ref 150–400)
RBC: 2.58 MIL/uL — AB (ref 4.22–5.81)
RDW: 16.8 % — ABNORMAL HIGH (ref 11.5–15.5)
WBC: 10.7 10*3/uL — AB (ref 4.0–10.5)

## 2017-06-08 MED ORDER — SORBITOL 70 % SOLN
960.0000 mL | TOPICAL_OIL | Freq: Once | ORAL | Status: DC
  Filled 2017-06-08: qty 473

## 2017-06-08 MED ORDER — MAGNESIUM HYDROXIDE NICU ORAL SYRINGE 400 MG/5 ML: 30.0000 mL | Freq: Every day | ORAL | Status: DC

## 2017-06-08 MED ORDER — PREDNISONE 20 MG PO TABS
60.0000 mg | ORAL_TABLET | Freq: Every day | ORAL | Status: DC
  Administered 2017-06-09 – 2017-06-10 (×2): 60 mg via ORAL
  Filled 2017-06-08 (×2): qty 3

## 2017-06-08 MED ORDER — MAGNESIUM HYDROXIDE 400 MG/5ML PO SUSP
30.0000 mL | Freq: Every day | ORAL | Status: DC
  Administered 2017-06-08 – 2017-06-10 (×3): 30 mL via ORAL
  Filled 2017-06-08 (×3): qty 30
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
## 2017-06-09 LAB — CBC WITH DIFFERENTIAL/PLATELET
BASOS ABS: 0 10*3/uL (ref 0.0–0.1)
BASOS PCT: 0 %
Eosinophils Absolute: 0 10*3/uL (ref 0.0–0.7)
Eosinophils Relative: 0 %
HEMATOCRIT: 27.3 % — AB (ref 39.0–52.0)
HEMOGLOBIN: 8.7 g/dL — AB (ref 13.0–17.0)
Lymphocytes Relative: 2 %
Lymphs Abs: 0.2 10*3/uL — ABNORMAL LOW (ref 0.7–4.0)
MCH: 33.1 pg (ref 26.0–34.0)
MCHC: 31.9 g/dL (ref 30.0–36.0)
MCV: 103.8 fL — ABNORMAL HIGH (ref 78.0–100.0)
MONOS PCT: 1 %
Monocytes Absolute: 0.1 10*3/uL (ref 0.1–1.0)
NEUTROS ABS: 7.9 10*3/uL — AB (ref 1.7–7.7)
NEUTROS PCT: 97 %
Platelets: 143 10*3/uL — ABNORMAL LOW (ref 150–400)
RBC: 2.63 MIL/uL — ABNORMAL LOW (ref 4.22–5.81)
RDW: 16.9 % — ABNORMAL HIGH (ref 11.5–15.5)
WBC: 8.2 10*3/uL (ref 4.0–10.5)

## 2017-06-09 LAB — BASIC METABOLIC PANEL
ANION GAP: 8 (ref 5–15)
BUN: 30 mg/dL — ABNORMAL HIGH (ref 6–20)
CHLORIDE: 96 mmol/L — AB (ref 101–111)
CO2: 36 mmol/L — AB (ref 22–32)
Calcium: 9.3 mg/dL (ref 8.9–10.3)
Creatinine, Ser: 1.35 mg/dL — ABNORMAL HIGH (ref 0.61–1.24)
GFR calc non Af Amer: 57 mL/min — ABNORMAL LOW (ref >60)
Glucose, Bld: 80 mg/dL (ref 65–99)
Potassium: 4.2 mmol/L (ref 3.5–5.1)
Sodium: 140 mmol/L (ref 135–145)

## 2017-06-09 LAB — LEGIONELLA PNEUMOPHILA SEROGP 1 UR AG: L. pneumophila Serogp 1 Ur Ag: NEGATIVE

## 2017-06-09 LAB — MAGNESIUM: Magnesium: 2.3 mg/dL (ref 1.7–2.4)

## 2017-06-09 MED ORDER — AMOXICILLIN-POT CLAVULANATE 875-125 MG PO TABS
1.0000 | ORAL_TABLET | Freq: Two times a day (BID) | ORAL | Status: DC
  Administered 2017-06-09 – 2017-06-10 (×3): 1 via ORAL
  Filled 2017-06-09 (×3): qty 1

## 2017-06-09 NOTE — Progress Notes (Signed)
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  Patient recently admitted from 05/28/2017 to 05/31/2017 for shortness of breath felt secondary to pleural effusion status post ultrasound-guided thoracentesis, acute COPD exacerbation and discharged home on prednisone.  Patient presents back with orthopnea, some increasing lower extremity edema.  Chest x-ray worrisome for asymmetric edema versus pneumonia.  Patient also with a productive cough with purulent sputum and subjective fevers. Patient placed empirically on IV vancomycin and IV cefepime for presumed healthcare associated pneumonia, scheduled nebs, IV Lasix.   Assessment & Plan:   Principal Problem:   SOB (shortness of breath) Active Problems:   HCAP (healthcare-associated pneumonia)   Tobacco abuse   Adenocarcinoma of left lung, stage 4 (HCC)   Spine metastasis (HCC)   COPD (chronic obstructive pulmonary disease) (HCC)   HTN (hypertension)   CKD (chronic kidney disease), stage III (HCC)   Pain   Constipation  #1 shortness of breath secondary to probable healthcare associated pneumonia/mild COPD exacerbation. Likely secondary to probable healthcare associated pneumonia, mild COPD exacerbation.  Doubt if patient diastolic heart failure as BNP within normal limits.  Patient was placed on IV Lasix and did diurese well.  Chest x-ray with right-sided infiltrates concerning for possible pneumonia versus asymmetric edema.  Patient denies any significant improvement since admission.  Urine strep pneumococcus antigen negative.  Urine Legionella antigen pending.  Blood cultures  pending with no growth to date.  Sputum Gram stain and culture pending.  Continue current scheduled nebs, Pulmicort, Mucinex, azithromycin.  Discontinue IV cefepime and start oral Augmentin to complete course of antibiotic treatment.  IV vancomycin has been discontinued.  Continue oral Lasix.  Continue prednisone taper.  Supportive care.    2.  Mild COPD exacerbation Patient O2 dependent.  Clinical improvement on steroid taper, Pulmicort, Claritin, scheduled nebs, antibiotics.  Continue diuresis.  Follow.  3.  Tobacco abuse Tobacco cessation.  4.  Abdominal pain/epigastric pain Likely secondary to probable constipation as patient on chronic pain medications.  Concern for also gastritis.  Abdominal films with significant stool noted.  Patient now on MiraLAX and Senokot-S twice daily.  Patient received mineral oil enema with no significant results.  Milk of magnesia given on 06/08/2017 with good bowel movement and some clinical improvement.  Continue current pain regimen.  PPI.   5.  Chronic combined grade 2 diastolic dysfunction and systolic dysfunction On admission due to concerns for volume overload patient was placed on Lasix 40 mg IV every 12 hours.  BNP was 73.1. Patient also with decreased albumin which may be leading to his edema.  Chest x-ray with asymmetric infiltrates more likely to be pneumonia versus edema.  Patient has been transitioned from IV Lasix to oral Lasix.  Patient with a urine output not recorded in the past 24 hours.  Continue current dose Lasix 20 mg daily.  Outpatient follow-up.   6.  Hypertension Blood pressure borderline improved with discontinuation of Norvasc and decreasing dose of metoprolol.  Continue current dose of Lasix.  Follow.   7.  Chronic kidney disease stage III Stable.  8.  Chronic pain Continue home regimen of oxycodone as needed.  9 depression/anxiety Continue Lexapro.  10.  Hypernatremia Likely secondary to diuresis.  Improved with after  transition from IV Lasix to oral Lasix.    11 stage IV lung cancer Stable.  Follow-up with oncology in the outpatient setting.   12.  Constipation Likely opioid induced as patient chronically on oxycodone as needed.  Patient refused MiraLAX and enema the night of 06/06/2017. It was discussed with patient that etiology of abdominal pain could well be secondary to significant constipation noted on abdominal films.  Patient received MiraLAX and mineral oil enema 06/07/2017 with no significant results.  Patient on MiraLAX twice daily as well as Senokot as twice daily.  Patient received milk of magnesia 06/08/2017 with good results.  Continue current bowel regimen.  Follow.       DVT prophylaxis: Lovenox Code Status: Full Family Communication: updated patient.  No family at bedside. Disposition Plan: To ALF once clinically improved and medically stable in the next 24 to 48 hours.   Consultants:   None  Procedures:   Chest x-ray 06/05/2017  Abdominal films 06/06/2017  Antimicrobials:  IV vancomycin 06/05/2017>>>>>> 06/07/2017  IV cefepime 06/05/2017>>>>>> 06/09/2017  Azithromycin 06/05/2017  Augmentin 06/09/2017   Subjective: Patient seen at the side of the bed.  Patient had a significant large bowel movement yesterday per RN.  States abdominal pain improved.  Some improvement with shortness of breath however not at baseline.  No chest pain.    Objective: Vitals:   06/08/17 1408 06/08/17 2050 06/09/17 0609 06/09/17 0741  BP:  103/72 111/69   Pulse: 63 74 61   Resp: 17 16 18    Temp:  98.4 F (36.9 C) 97.8 F (36.6 C)   TempSrc:  Oral Oral   SpO2: 98% 100% 90% 99%  Weight:      Height:        Intake/Output Summary (Last 24 hours) at 06/09/2017 1041 Last data filed at 06/08/2017 2000 Gross per 24 hour  Intake 1382 ml  Output -  Net 1382 ml   Filed Weights   06/06/17 1501 06/07/17 1410 06/08/17 0523  Weight: 52.5 kg (115 lb 11.9 oz) 52.6 kg (116 lb) 51.7 kg (113 lb 15.7 oz)      Examination:  General exam: NAD.  Frail.  Cachectic. Respiratory system: Improving coarse breath sounds.  Decreased crackles.  No wheezing.  No use of accessory muscles of respiration.  Cardiovascular system: Regular rate rhythm no murmurs rubs or gallops.  No lower extremity edema.   Gastrointestinal system: Abdomen is soft, nondistended, decreased tenderness to palpation, positive bowel sounds.  No rebound.  No guarding.   Central nervous system: Alert and oriented. No focal neurological deficits. Extremities: Symmetric 5 x 5 power. Skin: No rashes, lesions or ulcers Psychiatry: Judgement and insight appear normal. Mood & affect appropriate.     Data Reviewed: I have personally reviewed following labs and imaging studies  CBC: Recent Labs  Lab 06/03/17 1021 06/05/17 1059 06/06/17 0530 06/07/17 0500 06/08/17 0500 06/09/17 0500  WBC 8.0 8.6 6.4 8.3 10.7* 8.2  NEUTROABS 7.4* 7.5  --   --   --  7.9*  HGB 9.7* 9.2* 9.0* 8.7* 8.6* 8.7*  HCT 29.3* 28.5* 29.0* 27.1* 26.5* 27.3*  MCV 101.8* 103.6* 105.5* 104.6* 102.7* 103.8*  PLT 242 184 174 156 145* 782*   Basic Metabolic Panel: Recent Labs  Lab 06/05/17 1059 06/06/17 0530 06/07/17 0500 06/08/17 0500 06/09/17 0500  NA 145 147* 137 139 140  K 4.0 3.6 4.9 3.9  4.2  CL 103 100* 93* 94* 96*  CO2 32 36* 34* 34* 36*  GLUCOSE 109* 96 154* 105* 80  BUN 36* 31* 33* 37* 30*  CREATININE 1.38* 1.46* 1.47* 1.40* 1.35*  CALCIUM 9.5 9.1 9.1 9.2 9.3  MG  --   --   --   --  2.3   GFR: Estimated Creatinine Clearance: 44.1 mL/min (A) (by C-G formula based on SCr of 1.35 mg/dL (H)). Liver Function Tests: Recent Labs  Lab 06/03/17 1021 06/05/17 1059  AST 23 28  ALT 20 23  ALKPHOS 113 88  BILITOT 0.2 0.6  PROT 6.5 6.1*  ALBUMIN 3.2* 2.9*   No results for input(s): LIPASE, AMYLASE in the last 168 hours. No results for input(s): AMMONIA in the last 168 hours. Coagulation Profile: No results for input(s): INR, PROTIME in the  last 168 hours. Cardiac Enzymes: No results for input(s): CKTOTAL, CKMB, CKMBINDEX, TROPONINI in the last 168 hours. BNP (last 3 results) No results for input(s): PROBNP in the last 8760 hours. HbA1C: No results for input(s): HGBA1C in the last 72 hours. CBG: No results for input(s): GLUCAP in the last 168 hours. Lipid Profile: No results for input(s): CHOL, HDL, LDLCALC, TRIG, CHOLHDL, LDLDIRECT in the last 72 hours. Thyroid Function Tests: No results for input(s): TSH, T4TOTAL, FREET4, T3FREE, THYROIDAB in the last 72 hours. Anemia Panel: No results for input(s): VITAMINB12, FOLATE, FERRITIN, TIBC, IRON, RETICCTPCT in the last 72 hours. Sepsis Labs: Recent Labs  Lab 06/05/17 1755 06/06/17 0530 06/07/17 0500  PROCALCITON 0.11 0.28 0.61    Recent Results (from the past 240 hour(s))  Culture, blood (routine x 2) Call MD if unable to obtain prior to antibiotics being given     Status: None (Preliminary result)   Collection Time: 06/05/17 10:59 AM  Result Value Ref Range Status   Specimen Description   Final    BLOOD RIGHT HAND Performed at [facility] [address], [place], [place] [address]    Special Requests   Final    BOTTLES DRAWN AEROBIC AND ANAEROBIC Blood Culture adequate volume Performed at [facility] [address], [place], [place] [address]    Culture   Final    NO GROWTH 3 DAYS Performed at [facility], [place] [address], [place], [place] [address]    Report Status PENDING  Incomplete  Culture, blood (routine x 2) Call MD if unable to obtain prior to antibiotics being given     Status: None (Preliminary result)   Collection Time: 06/05/17 11:47 AM  Result Value Ref Range Status   Specimen Description   Final    BLOOD RIGHT FOREARM Performed at [facility] [address], [place], [place] [address]    Special Requests   Final    BOTTLES DRAWN AEROBIC AND ANAEROBIC Blood Culture adequate  volume Performed at [facility] [address], [place], [place] [address]    Culture   Final    NO GROWTH 3 DAYS Performed at [facility], [place] [address] [place],  [address]    Report Status PENDING  Incomplete         Radiology Studies: No results found.      Scheduled Meds: . amoxicillin-clavulanate  1 tablet Oral Q12H  . azithromycin  500 mg Oral Q24H  . budesonide (PULMICORT) nebulizer solution  0.5 mg Nebulization BID  . enoxaparin (LOVENOX) injection  40 mg Subcutaneous Q24H  .  escitalopram  5 mg Oral QHS  . feeding supplement (ENSURE ENLIVE)  237 mL Oral TID BM  . fluconazole  100 mg Oral Daily  . furosemide  20 mg Oral Daily  . guaiFENesin  1,200 mg Oral BID  . ipratropium-albuterol  3 mL Nebulization TID  . loratadine  10 mg Oral Daily  . magnesium hydroxide  30 mL Oral Daily  . metoprolol tartrate  12.5 mg Oral BID  . mineral oil  1 enema Rectal Once  . multivitamin with minerals  1 tablet Oral Daily  . pantoprazole  40 mg Oral BID  . polyethylene glycol  17 g Oral BID  . predniSONE  60 mg Oral QAC breakfast  . senna-docusate  1 tablet Oral BID   Continuous Infusions:    LOS: 3 days    Time spent: 35 minutes    Irine Seal, MD [facility] Pager [phone]  If 7PM-7AM, please contact night-coverage [email or web address] Password [facility] 06/09/2017, 10:41 AM

## 2017-06-10 ENCOUNTER — Telehealth: Payer: Self-pay | Admitting: Internal Medicine

## 2017-06-10 DIAGNOSIS — I5042 Chronic combined systolic (congestive) and diastolic (congestive) heart failure: Secondary | ICD-10-CM

## 2017-06-10 LAB — BASIC METABOLIC PANEL
ANION GAP: 12 (ref 5–15)
BUN: 28 mg/dL — ABNORMAL HIGH (ref 6–20)
CHLORIDE: 96 mmol/L — AB (ref 101–111)
CO2: 33 mmol/L — AB (ref 22–32)
Calcium: 9.4 mg/dL (ref 8.9–10.3)
Creatinine, Ser: 1.34 mg/dL — ABNORMAL HIGH (ref 0.61–1.24)
GFR calc Af Amer: >60 mL/min (ref >60)
GFR calc non Af Amer: 57 mL/min — ABNORMAL LOW (ref >60)
GLUCOSE: 106 mg/dL — AB (ref 65–99)
POTASSIUM: 4.8 mmol/L (ref 3.5–5.1)
Sodium: 141 mmol/L (ref 135–145)

## 2017-06-10 LAB — CULTURE, BLOOD (ROUTINE X 2)
CULTURE: NO GROWTH
Culture: NO GROWTH
SPECIAL REQUESTS: ADEQUATE
SPECIAL REQUESTS: ADEQUATE

## 2017-06-10 MED ORDER — MAGNESIUM HYDROXIDE 400 MG/5ML PO SUSP: 30.0000 mL | Freq: Every day | ORAL | 0 refills | Status: DC

## 2017-06-10 MED ORDER — FUROSEMIDE 20 MG PO TABS: 20.0000 mg | ORAL_TABLET | Freq: Every day | ORAL | 0 refills | Status: DC

## 2017-06-10 MED ORDER — METOPROLOL TARTRATE 25 MG PO TABS: 12.5000 mg | ORAL_TABLET | Freq: Two times a day (BID) | ORAL | 0 refills | Status: DC

## 2017-06-10 MED ORDER — HEPARIN SOD (PORK) LOCK FLUSH 100 UNIT/ML IV SOLN
500.0000 [IU] | Freq: Once | INTRAVENOUS | Status: AC
  Administered 2017-06-10: 500 [IU] via INTRAVENOUS
  Filled 2017-06-10: qty 5

## 2017-06-10 MED ORDER — PREDNISONE 20 MG PO TABS: 20.0000 mg | ORAL_TABLET | Freq: Every day | ORAL | 0 refills | Status: DC

## 2017-06-10 MED ORDER — AMOXICILLIN-POT CLAVULANATE 875-125 MG PO TABS: 1.0000 | ORAL_TABLET | Freq: Two times a day (BID) | ORAL | 0 refills | Status: AC

## 2017-06-10 MED ORDER — GUAIFENESIN ER 600 MG PO TB12: 1200.0000 mg | ORAL_TABLET | Freq: Two times a day (BID) | ORAL | 0 refills | Status: AC

## 2017-06-10 MED ORDER — FUROSEMIDE 40 MG PO TABS: 40.0000 mg | ORAL_TABLET | Freq: Every day | ORAL | 0 refills | Status: DC

## 2017-06-10 MED ORDER — POLYETHYLENE GLYCOL 3350 17 G PO PACK: 17.0000 g | PACK | Freq: Two times a day (BID) | ORAL | 0 refills | Status: DC

## 2017-06-10 MED ORDER — SENNOSIDES-DOCUSATE SODIUM 8.6-50 MG PO TABS: 1.0000 | ORAL_TABLET | Freq: Two times a day (BID) | ORAL | Status: DC
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
## 2017-06-10 NOTE — Telephone Encounter (Signed)
Spoke w/ pt re sch change per MM from 6/4 to 6/3 for lab and dr visit.  Patient aware and will stop by scheduling after visit today for new calendar.

## 2017-06-10 NOTE — NC FL2 (Signed)
[place] LEVEL OF CARE SCREENING TOOL     IDENTIFICATION  Patient Name: [name] Birthdate: 16-Mar-1960 Sex: male Admission Date (Current Location): 06/05/2017  [facility] and [place] Number:  Herbalist and Address:  [facility],  [place], [place]      Provider Number: [id number]  Attending Physician Name and Address:  Eugenie Filler, MD  Relative Name and Phone Number:       Current Level of Care: Hospital Recommended Level of Care: [place] Prior Approval Number:    Date Approved/Denied:   PASRR Number:    Discharge Plan: Other (Comment)(assisted living)    Current Diagnoses: Patient Active Problem List   Diagnosis Date Noted  . Chronic combined systolic (congestive) and diastolic (congestive) heart failure ([place])   . Constipation 06/07/2017  . Pain   . Chronic respiratory failure with hypoxia ([place]) 04/01/2017  . Leucocytosis 03/16/2017  . CKD (chronic kidney disease), stage III ([place]) 03/12/2017  . Malnutrition of moderate degree 03/10/2017  . COPD with acute exacerbation ([place]) 03/09/2017  . Anemia 02/07/2017  . Severe malnutrition ([place]) 01/03/2017  . DOE (dyspnea on exertion) 12/30/2016  . Nausea   . Hypervolemia   . HCAP (healthcare-associated pneumonia) 12/19/2016  . SOB (shortness of breath) 12/19/2016  . Erosive gastropathy 12/18/2016  . Gastritis 12/18/2016  . Hematemesis 12/16/2016  . Intractable nausea and vomiting 12/16/2016  . HTN (hypertension) 10/30/2016  . Chronic obstructive pulmonary disease ([place]) 09/02/2016  . COPD (chronic obstructive pulmonary disease) ([place]) 08/19/2016  . Dehydration 06/04/2016  . Abdominal pain 06/04/2016  . 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 Adenocarcinoma of left lung, stage 4 ([place]) 05/02/2016  . Goals of care, counseling/discussion 05/02/2016  . Encounter for antineoplastic chemotherapy 05/02/2016  . Tobacco abuse 05/30/2011  . Alcohol  abuse 05/30/2011  . Seizure ([address]) 05/28/2011    Orientation RESPIRATION BLADDER Height & Weight     Self, Time, Situation, Place  O2(2.5 L) Continent Weight: 110 lb 0.2 oz (49.9 kg) Height:  6' (182.9 cm)  BEHAVIORAL SYMPTOMS/MOOD NEUROLOGICAL BOWEL NUTRITION STATUS      Continent    AMBULATORY STATUS COMMUNICATION OF NEEDS Skin   Limited Assist Verbally                         Personal Care Assistance Level of Assistance  Bathing, Feeding, Dressing Bathing Assistance: Limited assistance Feeding assistance: Limited assistance Dressing Assistance: Limited assistance     Functional Limitations Info  Sight, Hearing, Speech Sight Info: Adequate Hearing Info: Adequate Speech Info: Adequate    SPECIAL CARE FACTORS FREQUENCY                       Contractures Contractures Info: Not present    Additional Factors Info  Code Status, Allergies Code Status Info: DNR Allergies Info: nka           Current Medications (06/10/2017):  This is the current hospital active medication list Current Facility-Administered Medications  Medication Dose Route Frequency Provider Last Rate Last Dose  . acetaminophen (TYLENOL) tablet 650 mg  650 mg Oral Q6H PRN Purohit, Shrey C, MD       Or  . acetaminophen (TYLENOL) suppository 650 mg  650 mg Rectal Q6H PRN Purohit, Shrey C, MD      . albuterol (PROVENTIL) (2.5 MG/3ML) 0.083% nebulizer solution 2.5 mg  2.5 mg Nebulization Q4H PRN Purohit, Shrey C,  MD   2.5 mg at 06/08/17 0345  . amoxicillin-clavulanate (AUGMENTIN) 875-125 MG per tablet 1 tablet  1 tablet Oral Q12H Eugenie Filler, MD   1 tablet at 06/10/17 0945  . azithromycin (ZITHROMAX) tablet 500 mg  500 mg Oral Q24H Purohit, Shrey C, MD   500 mg at 06/09/17 1743  . budesonide (PULMICORT) nebulizer solution 0.5 mg  0.5 mg Nebulization BID Eugenie Filler, MD   0.5 mg at 06/10/17 6433  . diphenhydrAMINE (BENADRYL) tablet 25 mg  25 mg Oral QHS PRN Purohit, Shrey C, MD       . enoxaparin (LOVENOX) injection 40 mg  40 mg Subcutaneous Q24H Purohit, Shrey C, MD      . escitalopram (LEXAPRO) tablet 5 mg  5 mg Oral QHS Purohit, Shrey C, MD   5 mg at 06/09/17 2219  . feeding supplement (ENSURE ENLIVE) (ENSURE ENLIVE) liquid 237 mL  237 mL Oral TID BM Purohit, Shrey C, MD   237 mL at 06/10/17 0949  . fluconazole (DIFLUCAN) tablet 100 mg  100 mg Oral Daily Purohit, Shrey C, MD   100 mg at 06/10/17 0944  . furosemide (LASIX) tablet 20 mg  20 mg Oral Daily Eugenie Filler, MD   20 mg at 06/10/17 0945  . guaiFENesin (MUCINEX) 12 hr tablet 1,200 mg  1,200 mg Oral BID Eugenie Filler, MD   1,200 mg at 06/10/17 0945  . guaiFENesin-dextromethorphan (ROBITUSSIN DM) 100-10 MG/5ML syrup 5 mL  5 mL Oral Q4H PRN Purohit, Shrey C, MD   5 mL at 06/06/17 0144  . ipratropium-albuterol (DUONEB) 0.5-2.5 (3) MG/3ML nebulizer solution 3 mL  3 mL Nebulization TID Purohit, Shrey C, MD   3 mL at 06/10/17 1407  . loratadine (CLARITIN) tablet 10 mg  10 mg Oral Daily Purohit, Shrey C, MD   10 mg at 06/10/17 0946  . magnesium hydroxide (MILK OF MAGNESIA) suspension 30 mL  30 mL Oral Daily Eugenie Filler, MD   30 mL at 06/10/17 0949  . metoprolol tartrate (LOPRESSOR) tablet 12.5 mg  12.5 mg Oral BID Eugenie Filler, MD   12.5 mg at 06/10/17 0947  . mineral oil enema 1 enema  1 enema Rectal Once Eugenie Filler, MD      . multivitamin with minerals tablet 1 tablet  1 tablet Oral Daily Eugenie Filler, MD   1 tablet at 06/10/17 0950  . ondansetron (ZOFRAN) tablet 4 mg  4 mg Oral Q6H PRN Purohit, Shrey C, MD       Or  . ondansetron (ZOFRAN) injection 4 mg  4 mg Intravenous Q6H PRN Purohit, Konrad Dolores, MD   4 mg at 06/05/17 2056  . oxyCODONE (Oxy IR/ROXICODONE) immediate release tablet 10 mg  10 mg Oral Q4H PRN Eugenie Filler, MD   10 mg at 06/10/17 2951  . pantoprazole (PROTONIX) EC tablet 40 mg  40 mg Oral BID Purohit, Shrey C, MD   40 mg at 06/10/17 0950  . polyethylene glycol  (MIRALAX / GLYCOLAX) packet 17 g  17 g Oral BID Eugenie Filler, MD   17 g at 06/10/17 0949  . predniSONE (DELTASONE) tablet 60 mg  60 mg Oral QAC breakfast Eugenie Filler, MD   60 mg at 06/10/17 0945  . senna-docusate (Senokot-S) tablet 1 tablet  1 tablet Oral BID Eugenie Filler, MD   1 tablet at 06/10/17 1127     Discharge Medications: TAKE these medications  acetaminophen 500 MG tablet Commonly known as:  TYLENOL Take 500 mg by mouth 2 (two) times daily as needed for mild pain or headache.   albuterol (2.5 MG/3ML) 0.083% nebulizer solution Commonly known as:  PROVENTIL 1 neb every 4-6 hours as needed for wheezing and shortness of breath What changed:    how much to take  how to take this  when to take this  reasons to take this  additional instructions   ALKA-SELTZER PLUS COLD PO Take 2 tablets by mouth at bedtime as needed (COUGH).   amoxicillin-clavulanate 875-125 MG tablet Commonly known as:  AUGMENTIN Take 1 tablet by mouth every 12 (twelve) hours for 3 days.   escitalopram 5 MG tablet Commonly known as:  LEXAPRO Take 1 tablet (5 mg total) by mouth at bedtime.   feeding supplement (ENSURE ENLIVE) Liqd Take 237 mLs by mouth 3 (three) times daily between meals.   fluconazole 100 MG tablet Commonly known as:  DIFLUCAN Take 1 tablet (100 mg total) by mouth daily. What changed:  additional instructions   Fluticasone-Umeclidin-Vilant 100-62.5-25 MCG/INH Aepb Commonly known as:  TRELEGY ELLIPTA Inhale 1 puff into the lungs daily.   furosemide 20 MG tablet Commonly known as:  LASIX Take 1 tablet (20 mg total) by mouth daily. What changed:  medication strength   guaiFENesin 600 MG 12 hr tablet Commonly known as:  MUCINEX Take 2 tablets (1,200 mg total) by mouth 2 (two) times daily for 3 days. What changed:  how much to take   guaiFENesin-dextromethorphan 100-10 MG/5ML syrup Commonly known as:  ROBITUSSIN DM Take 5 mLs by mouth  every 4 (four) hours as needed for cough.   Ipratropium-Albuterol 20-100 MCG/ACT Aers respimat Commonly known as:  COMBIVENT Inhale 1 puff into the lungs every 6 (six) hours.   loratadine 10 MG tablet Commonly known as:  CLARITIN Take 10 mg by mouth daily.   magnesium hydroxide 400 MG/5ML suspension Commonly known as:  MILK OF MAGNESIA Take 30 mLs by mouth daily. Start taking on:  06/11/2017   metoprolol tartrate 25 MG tablet Commonly known as:  LOPRESSOR Take 0.5 tablets (12.5 mg total) by mouth 2 (two) times daily. What changed:  how much to take   ondansetron 4 MG disintegrating tablet Commonly known as:  ZOFRAN-ODT Take 4 mg by mouth every 8 (eight) hours as needed for nausea or vomiting.   Oxycodone HCl 10 MG Tabs Take 10 mg by mouth every 4 (four) hours as needed (pain). What changed:  Another medication with the same name was removed. Continue taking this medication, and follow the directions you see here.   pantoprazole 40 MG tablet Commonly known as:  PROTONIX Take 1 tablet (40 mg total) by mouth 2 (two) times daily.   polyethylene glycol packet Commonly known as:  MIRALAX / GLYCOLAX Take 17 g by mouth 2 (two) times daily. What changed:    when to take this  reasons to take this   predniSONE 20 MG tablet Commonly known as:  DELTASONE Take 1-3 tablets (20-60 mg total) by mouth daily before breakfast. Take 3 tablets (60mg ) x 1 day, then 2 tablets (40mg ) x 3 days, then 1 tablet (20mg ) x 3 days then stop. Start taking on:  06/11/2017 What changed:    how much to take  when to take this  additional instructions   senna-docusate 8.6-50 MG tablet Commonly known as:  SENNA-PLUS Take 1 tablet by mouth 2 (two) times daily. What changed:  when to take  this                                 Durable Medical Equipment  (From admission, onward)               Start     Ordered   06/10/17 1448  For home use only DME Nebulizer machine   Once    Question:  Patient needs a nebulizer to treat with the following condition  Answer:  COPD (chronic obstructive pulmonary disease) ([place])   06/10/17 1447        Relevant Imaging Results:  Relevant Lab Results:   Additional Information SS#: [id number]  Nila Nephew, LCSW

## 2017-06-10 NOTE — Progress Notes (Signed)
Left voicemail with pt's ALF [facility] to update facility on pt's status. Will assist with transition back at DC.  Sharren Bridge, MSW, LCSW Clinical Social Work 06/10/2017 [phone]

## 2017-06-10 NOTE — Progress Notes (Signed)
Pt returning to his ALF [facility] at [place] today. Report [phone] Pt states he has informed his family of DC today CSW provided FL2 and DC summary to facility- facility states pt's prescriptions can be filled with hard scripts sent with pt- provided in transfer packet. Will arrange [facility] transport.  Sharren Bridge, MSW, LCSW Clinical Social Work 06/10/2017 [phone]

## 2017-06-13 ENCOUNTER — Other Ambulatory Visit: Payer: Self-pay | Admitting: *Deleted

## 2017-06-13 NOTE — Telephone Encounter (Signed)
Returned call to [name] at [facility], RN was advised she has left for the day. Left message for her to call back regarding her concerns.

## 2017-06-17 ENCOUNTER — Ambulatory Visit: Payer: Medicaid Other

## 2017-06-17 ENCOUNTER — Ambulatory Visit: Payer: Medicaid Other | Admitting: Nurse Practitioner

## 2017-06-17 ENCOUNTER — Other Ambulatory Visit: Payer: Medicaid Other

## 2017-06-21 ENCOUNTER — Other Ambulatory Visit: Payer: Self-pay

## 2017-06-21 ENCOUNTER — Encounter (HOSPITAL_COMMUNITY): Payer: Self-pay

## 2017-06-21 ENCOUNTER — Emergency Department (HOSPITAL_COMMUNITY): Payer: Medicaid Other

## 2017-06-21 ENCOUNTER — Observation Stay (HOSPITAL_COMMUNITY)
Admission: EM | Admit: 2017-06-21 | Discharge: 2017-06-23 | Disposition: A | Discharge status: Home / Self Care | Payer: Medicaid Other | Attending: Internal Medicine | Admitting: Internal Medicine

## 2017-06-21 DIAGNOSIS — D649 Anemia, unspecified: Secondary | ICD-10-CM | POA: Insufficient documentation

## 2017-06-21 DIAGNOSIS — K219 Gastro-esophageal reflux disease without esophagitis: Secondary | ICD-10-CM | POA: Insufficient documentation

## 2017-06-21 DIAGNOSIS — R0600 Dyspnea, unspecified: Secondary | ICD-10-CM | POA: Diagnosis present

## 2017-06-21 DIAGNOSIS — K2970 Gastritis, unspecified, without bleeding: Secondary | ICD-10-CM | POA: Diagnosis not present

## 2017-06-21 DIAGNOSIS — I130 Hypertensive heart and chronic kidney disease with heart failure and stage 1 through stage 4 chronic kidney disease, or unspecified chronic kidney disease: Secondary | ICD-10-CM | POA: Insufficient documentation

## 2017-06-21 DIAGNOSIS — Z66 Do not resuscitate: Secondary | ICD-10-CM | POA: Diagnosis not present

## 2017-06-21 DIAGNOSIS — R109 Unspecified abdominal pain: Secondary | ICD-10-CM | POA: Diagnosis present

## 2017-06-21 DIAGNOSIS — I10 Essential (primary) hypertension: Secondary | ICD-10-CM | POA: Diagnosis not present

## 2017-06-21 DIAGNOSIS — R1013 Epigastric pain: Secondary | ICD-10-CM | POA: Diagnosis not present

## 2017-06-21 DIAGNOSIS — F419 Anxiety disorder, unspecified: Secondary | ICD-10-CM | POA: Diagnosis not present

## 2017-06-21 DIAGNOSIS — D72819 Decreased white blood cell count, unspecified: Secondary | ICD-10-CM | POA: Insufficient documentation

## 2017-06-21 DIAGNOSIS — E43 Unspecified severe protein-calorie malnutrition: Secondary | ICD-10-CM | POA: Insufficient documentation

## 2017-06-21 DIAGNOSIS — C3492 Malignant neoplasm of unspecified part of left bronchus or lung: Secondary | ICD-10-CM | POA: Diagnosis present

## 2017-06-21 DIAGNOSIS — J90 Pleural effusion, not elsewhere classified: Secondary | ICD-10-CM | POA: Insufficient documentation

## 2017-06-21 DIAGNOSIS — J449 Chronic obstructive pulmonary disease, unspecified: Secondary | ICD-10-CM | POA: Diagnosis not present

## 2017-06-21 DIAGNOSIS — N183 Chronic kidney disease, stage 3 unspecified: Secondary | ICD-10-CM | POA: Diagnosis present

## 2017-06-21 DIAGNOSIS — J9611 Chronic respiratory failure with hypoxia: Secondary | ICD-10-CM | POA: Diagnosis not present

## 2017-06-21 DIAGNOSIS — Z79899 Other long term (current) drug therapy: Secondary | ICD-10-CM | POA: Diagnosis not present

## 2017-06-21 DIAGNOSIS — I5042 Chronic combined systolic (congestive) and diastolic (congestive) heart failure: Secondary | ICD-10-CM | POA: Diagnosis not present

## 2017-06-21 DIAGNOSIS — J189 Pneumonia, unspecified organism: Secondary | ICD-10-CM

## 2017-06-21 DIAGNOSIS — R0602 Shortness of breath: Secondary | ICD-10-CM | POA: Diagnosis not present

## 2017-06-21 DIAGNOSIS — E860 Dehydration: Secondary | ICD-10-CM | POA: Insufficient documentation

## 2017-06-21 DIAGNOSIS — F329 Major depressive disorder, single episode, unspecified: Secondary | ICD-10-CM | POA: Insufficient documentation

## 2017-06-21 DIAGNOSIS — K5900 Constipation, unspecified: Secondary | ICD-10-CM

## 2017-06-21 DIAGNOSIS — Z7982 Long term (current) use of aspirin: Secondary | ICD-10-CM | POA: Insufficient documentation

## 2017-06-21 DIAGNOSIS — N179 Acute kidney failure, unspecified: Secondary | ICD-10-CM | POA: Insufficient documentation

## 2017-06-21 DIAGNOSIS — K3189 Other diseases of stomach and duodenum: Secondary | ICD-10-CM | POA: Diagnosis present

## 2017-06-21 LAB — CBC WITH DIFFERENTIAL/PLATELET
BASOS ABS: 0 10*3/uL (ref 0.0–0.1)
Basophils Relative: 0 %
EOS PCT: 2 %
Eosinophils Absolute: 0.1 10*3/uL (ref 0.0–0.7)
HEMATOCRIT: 23.7 % — AB (ref 39.0–52.0)
HEMOGLOBIN: 7.9 g/dL — AB (ref 13.0–17.0)
LYMPHS ABS: 0.6 10*3/uL — AB (ref 0.7–4.0)
LYMPHS PCT: 18 %
MCH: 33.2 pg (ref 26.0–34.0)
MCHC: 33.3 g/dL (ref 30.0–36.0)
MCV: 99.6 fL (ref 78.0–100.0)
MONOS PCT: 16 %
Monocytes Absolute: 0.5 10*3/uL (ref 0.1–1.0)
Neutro Abs: 2 10*3/uL (ref 1.7–7.7)
Neutrophils Relative %: 64 %
Platelets: 102 10*3/uL — ABNORMAL LOW (ref 150–400)
RBC: 2.38 MIL/uL — AB (ref 4.22–5.81)
RDW: 15.2 % (ref 11.5–15.5)
WBC: 3.2 10*3/uL — ABNORMAL LOW (ref 4.0–10.5)

## 2017-06-21 LAB — BASIC METABOLIC PANEL
ANION GAP: 6 (ref 5–15)
BUN: 26 mg/dL — ABNORMAL HIGH (ref 6–20)
CHLORIDE: 103 mmol/L (ref 101–111)
CO2: 33 mmol/L — AB (ref 22–32)
Calcium: 9.2 mg/dL (ref 8.9–10.3)
Creatinine, Ser: 1.92 mg/dL — ABNORMAL HIGH (ref 0.61–1.24)
GFR calc Af Amer: 43 mL/min — ABNORMAL LOW (ref >60)
GFR calc non Af Amer: 37 mL/min — ABNORMAL LOW (ref >60)
Glucose, Bld: 112 mg/dL — ABNORMAL HIGH (ref 65–99)
POTASSIUM: 4.2 mmol/L (ref 3.5–5.1)
Sodium: 142 mmol/L (ref 135–145)

## 2017-06-21 LAB — TROPONIN I: Troponin I: <0.03 ng/mL (ref <0.03)

## 2017-06-21 LAB — BRAIN NATRIURETIC PEPTIDE: B Natriuretic Peptide: 41.6 pg/mL (ref 0.0–100.0)

## 2017-06-21 MED ORDER — FLUTICASONE-UMECLIDIN-VILANT 100-62.5-25 MCG/INH IN AEPB: 1.0000 | INHALATION_SPRAY | Freq: Every day | RESPIRATORY_TRACT | Status: DC

## 2017-06-21 MED ORDER — AMLODIPINE BESYLATE 10 MG PO TABS
10.0000 mg | ORAL_TABLET | Freq: Every day | ORAL | Status: DC
  Administered 2017-06-21 – 2017-06-23 (×3): 10 mg via ORAL
  Filled 2017-06-21 (×3): qty 1

## 2017-06-21 MED ORDER — SENNOSIDES-DOCUSATE SODIUM 8.6-50 MG PO TABS
1.0000 | ORAL_TABLET | Freq: Two times a day (BID) | ORAL | Status: DC
  Administered 2017-06-21 – 2017-06-23 (×4): 1 via ORAL
  Filled 2017-06-21 (×4): qty 1

## 2017-06-21 MED ORDER — ENOXAPARIN SODIUM 30 MG/0.3ML ~~LOC~~ SOLN: 30.0000 mg | SUBCUTANEOUS | Status: DC

## 2017-06-21 MED ORDER — ONDANSETRON HCL 4 MG/2ML IJ SOLN
4.0000 mg | Freq: Once | INTRAMUSCULAR | Status: AC
  Administered 2017-06-21: 4 mg via INTRAVENOUS
  Filled 2017-06-21: qty 2

## 2017-06-21 MED ORDER — ONDANSETRON HCL 4 MG/2ML IJ SOLN
4.0000 mg | Freq: Four times a day (QID) | INTRAMUSCULAR | Status: DC | PRN
Start: 2017-06-21 — End: 2017-06-23
  Administered 2017-06-21 – 2017-06-23 (×6): 4 mg via INTRAVENOUS
  Filled 2017-06-21 (×6): qty 2

## 2017-06-21 MED ORDER — PANTOPRAZOLE SODIUM 40 MG PO TBEC
40.0000 mg | DELAYED_RELEASE_TABLET | Freq: Two times a day (BID) | ORAL | Status: DC
  Administered 2017-06-21 – 2017-06-23 (×5): 40 mg via ORAL
  Filled 2017-06-21 (×4): qty 1

## 2017-06-21 MED ORDER — ESCITALOPRAM OXALATE 10 MG PO TABS
5.0000 mg | ORAL_TABLET | Freq: Every day | ORAL | Status: DC
  Administered 2017-06-21 – 2017-06-22 (×2): 5 mg via ORAL
  Filled 2017-06-21 (×2): qty 1

## 2017-06-21 MED ORDER — GUAIFENESIN-DM 100-10 MG/5ML PO SYRP: 5.0000 mL | ORAL_SOLUTION | ORAL | Status: DC | PRN

## 2017-06-21 MED ORDER — ALBUTEROL SULFATE (2.5 MG/3ML) 0.083% IN NEBU
5.0000 mg | INHALATION_SOLUTION | Freq: Once | RESPIRATORY_TRACT | Status: AC
  Administered 2017-06-21: 5 mg via RESPIRATORY_TRACT
  Filled 2017-06-21: qty 6

## 2017-06-21 MED ORDER — SODIUM CHLORIDE 0.9 % IV BOLUS
1000.0000 mL | Freq: Once | INTRAVENOUS | Status: AC
  Administered 2017-06-21: 1000 mL via INTRAVENOUS

## 2017-06-21 MED ORDER — UMECLIDINIUM BROMIDE 62.5 MCG/INH IN AEPB
1.0000 | INHALATION_SPRAY | Freq: Every day | RESPIRATORY_TRACT | Status: DC
  Administered 2017-06-22 – 2017-06-23 (×2): 1 via RESPIRATORY_TRACT
  Filled 2017-06-21: qty 7

## 2017-06-21 MED ORDER — FLUTICASONE FUROATE-VILANTEROL 100-25 MCG/INH IN AEPB
1.0000 | INHALATION_SPRAY | Freq: Every day | RESPIRATORY_TRACT | Status: DC
  Administered 2017-06-22 – 2017-06-23 (×2): 1 via RESPIRATORY_TRACT
  Filled 2017-06-21: qty 28

## 2017-06-21 MED ORDER — ENOXAPARIN SODIUM 40 MG/0.4ML ~~LOC~~ SOLN
40.0000 mg | SUBCUTANEOUS | Status: DC
  Filled 2017-06-21: qty 0.4

## 2017-06-21 MED ORDER — VANCOMYCIN HCL IN DEXTROSE 1-5 GM/200ML-% IV SOLN
1000.0000 mg | Freq: Once | INTRAVENOUS | Status: AC
  Administered 2017-06-21: 1000 mg via INTRAVENOUS
  Filled 2017-06-21: qty 200

## 2017-06-21 MED ORDER — POLYETHYLENE GLYCOL 3350 17 G PO PACK
17.0000 g | PACK | Freq: Two times a day (BID) | ORAL | Status: DC
  Administered 2017-06-21 – 2017-06-22 (×3): 17 g via ORAL
  Filled 2017-06-21 (×3): qty 1

## 2017-06-21 MED ORDER — FUROSEMIDE 40 MG PO TABS
20.0000 mg | ORAL_TABLET | Freq: Every day | ORAL | Status: DC
  Administered 2017-06-22: 20 mg via ORAL
  Filled 2017-06-21 (×3): qty 1

## 2017-06-21 MED ORDER — OXYCODONE-ACETAMINOPHEN 5-325 MG PO TABS
2.0000 | ORAL_TABLET | Freq: Once | ORAL | Status: AC
  Administered 2017-06-21: 2 via ORAL
  Filled 2017-06-21: qty 2

## 2017-06-21 MED ORDER — OXYCODONE HCL 5 MG PO TABS
10.0000 mg | ORAL_TABLET | ORAL | Status: DC | PRN
  Administered 2017-06-21 – 2017-06-23 (×7): 10 mg via ORAL
  Filled 2017-06-21 (×7): qty 2

## 2017-06-21 MED ORDER — ENSURE ENLIVE PO LIQD
237.0000 mL | Freq: Three times a day (TID) | ORAL | Status: DC
  Administered 2017-06-21 – 2017-06-23 (×4): 237 mL via ORAL

## 2017-06-21 MED ORDER — LORATADINE 10 MG PO TABS
10.0000 mg | ORAL_TABLET | Freq: Every day | ORAL | Status: DC
  Administered 2017-06-21 – 2017-06-23 (×3): 10 mg via ORAL
  Filled 2017-06-21 (×3): qty 1

## 2017-06-21 MED ORDER — LORAZEPAM 0.5 MG PO TABS: 0.5000 mg | ORAL_TABLET | Freq: Two times a day (BID) | ORAL | Status: DC | PRN

## 2017-06-21 MED ORDER — ALBUTEROL SULFATE (2.5 MG/3ML) 0.083% IN NEBU
2.5000 mg | INHALATION_SOLUTION | RESPIRATORY_TRACT | Status: DC | PRN
Start: 2017-06-21 — End: 2017-06-23

## 2017-06-21 MED ORDER — CEFEPIME HCL 2 G IJ SOLR
2.0000 g | Freq: Once | INTRAMUSCULAR | Status: AC
  Administered 2017-06-21: 2 g via INTRAVENOUS
  Filled 2017-06-21: qty 2
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
## 2017-06-21 NOTE — ED Triage Notes (Signed)
Patient arrives by [employer] from [facility] with complaints of shortness of breath-patient has Stage IV lung cancer. Patient currently on O2 2.5 liters/nasal cannula as needed but patient states "I need it all the time." Increased SOB tonight-wheezing left upper/left upper and lower breath sounds diminished. Administered Albuterol 5 mg with some improvement in SOB-patient has wet congested cough. Patient also complaining of abdominal pain with intermittent nausea and vomiting for the last 3 weeks. Patient next chemo is on June 3rd-Oncologist is [name].

## 2017-06-21 NOTE — ED Provider Notes (Signed)
[name] Provider Note   CSN: [id number] Arrival date & time: 06/21/17  0158     History   Chief Complaint Chief Complaint  Patient presents with  . Shortness of Breath  . Lung Cancer    HPI [name] is a 57 y.o. male who presents with generalized pain and SOB. PMH significant for left lung cancer (stage 4, on chemo), current tobacco use, chronic abdominal pain, COPD. He states that since discharged a couple weeks ago for HCAP he has had good days and bad days. Today was worse because his pain has been uncontrolled and he has also felt more SOB. He reports the pain is everywhere. Last night he asked the nurse tech at his rehab facility to get him some medicine for nausea and while he was waiting he felt so short of breath that he wanted to come to the ED. He reports associated intermittent fever, wheezing and a productive cough with some post-tussive emesis. He reports his abdominal pain is "killing him" and states that this is chronic, just worse than normal. He has nausea and vomiting.   Oncologist: [name]  HPI  Past Medical History:  Diagnosis Date  . Abdominal pain 06/04/2016  . Adenocarcinoma of left lung, stage 4 ([place]) 05/02/2016  . Alcohol abuse   . Bronchitis due to tobacco use ([place])   . Cancer ([place])    Lung  . COPD (chronic obstructive pulmonary disease) ([place])   . Dehydration 06/04/2016  . Encounter for antineoplastic chemotherapy 05/02/2016  . Gastritis   . Goals of care, counseling/discussion 05/02/2016  . Hematemesis   . HTN (hypertension) 10/30/2016  . Recurrent upper respiratory infection (URI)   . Seizures ([place]) 05/2011   new onset  . Shortness of breath     Patient Active Problem List   Diagnosis Date Noted  . Chronic combined systolic (congestive) and diastolic (congestive) heart failure ([place])   . Constipation 06/07/2017  . Pain   . Chronic respiratory failure with hypoxia ([place]) 04/01/2017  . Leucocytosis  03/16/2017  . CKD (chronic kidney disease), stage III ([place]) 03/12/2017  . Malnutrition of moderate degree 03/10/2017  . COPD with acute exacerbation ([place]) 03/09/2017  . Anemia 02/07/2017  . Severe malnutrition ([place]) 01/03/2017  . DOE (dyspnea on exertion) 12/30/2016  . Nausea   . Hypervolemia   . HCAP (healthcare-associated pneumonia) 12/19/2016  . SOB (shortness of breath) 12/19/2016  . Erosive gastropathy 12/18/2016  . Gastritis 12/18/2016  . Hematemesis 12/16/2016  . Intractable nausea and vomiting 12/16/2016  . HTN (hypertension) 10/30/2016  . Chronic obstructive pulmonary disease ([place]) 09/02/2016  . COPD (chronic obstructive pulmonary disease) ([place]) 08/19/2016  . Dehydration 06/04/2016  . Abdominal pain 06/04/2016  . 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 Adenocarcinoma of left lung, stage 4 ([place]) 05/02/2016  . Goals of care, counseling/discussion 05/02/2016  . Encounter for antineoplastic chemotherapy 05/02/2016  . Tobacco abuse 05/30/2011  . Alcohol abuse 05/30/2011  . Seizure ([place]) 05/28/2011    Past Surgical History:  Procedure Laterality Date  . ESOPHAGOGASTRODUODENOSCOPY (EGD) WITH PROPOFOL N/A 12/17/2016   Procedure: ESOPHAGOGASTRODUODENOSCOPY (EGD) WITH PROPOFOL;  Surgeon: Ladene Artist, MD;  Location: [facility] END[facility]COPY;  Service: Endoscopy;  Laterality: N/A;  . IR FLUORO GUIDE PORT INSERTION RIGHT  05/13/2016  . IR US GUIDE VASC ACCESS RIGHT  05/13/2016  . NO PAST SURGERIES          Home Medications    Prior to Admission medications   Medication  Sig Start Date End Date Taking? Authorizing Provider  acetaminophen (TYLENOL) 500 MG tablet Take 500 mg by mouth 2 (two) times daily as needed for mild pain or headache.    [provider]  albuterol (PROVENTIL) (2.5 MG/3ML) 0.083% nebulizer solution 1 neb every 4-6 hours as needed for wheezing and shortness of breath Patient taking differently: Take 2.5 mg by nebulization every 4 (four) hours as needed for  wheezing or shortness of breath.  04/01/17   Parrett, Fonnie Mu, NP  Chlorphen-Phenyleph-ASA (ALKA-SELTZER PLUS COLD PO) Take 2 tablets by mouth at bedtime as needed (COUGH).    [provider]  escitalopram (LEXAPRO) 5 MG tablet Take 1 tablet (5 mg total) by mouth at bedtime. 04/17/17   Rosita Fire, MD  feeding supplement, ENSURE ENLIVE, (ENSURE ENLIVE) LIQD Take 237 mLs by mouth 3 (three) times daily between meals. 03/19/17   Shelly Coss, MD  fluconazole (DIFLUCAN) 100 MG tablet Take 1 tablet (100 mg total) by mouth daily. Patient taking differently: Take 100 mg by mouth daily. Continuous 03/20/17   Shelly Coss, MD  Fluticasone-Umeclidin-Vilant (TRELEGY ELLIPTA) 100-62.5-25 MCG/INH AEPB Inhale 1 puff into the lungs daily. 05/31/17   Aline August, MD  furosemide (LASIX) 20 MG tablet Take 1 tablet (20 mg total) by mouth daily. 06/10/17   Eugenie Filler, MD  guaiFENesin-dextromethorphan (ROBITUSSIN DM) 100-10 MG/5ML syrup Take 5 mLs by mouth every 4 (four) hours as needed for cough. 05/31/17   Aline August, MD  Ipratropium-Albuterol (COMBIVENT) 20-100 MCG/ACT AERS respimat Inhale 1 puff into the lungs every 6 (six) hours. 05/31/17   Aline August, MD  loratadine (CLARITIN) 10 MG tablet Take 10 mg by mouth daily.    [provider]  magnesium hydroxide (MILK OF MAGNESIA) 400 MG/5ML suspension Take 30 mLs by mouth daily. 06/11/17   Eugenie Filler, MD  metoprolol tartrate (LOPRESSOR) 25 MG tablet Take 0.5 tablets (12.5 mg total) by mouth 2 (two) times daily. 06/10/17   Eugenie Filler, MD  ondansetron (ZOFRAN-ODT) 4 MG disintegrating tablet Take 4 mg by mouth every 8 (eight) hours as needed for nausea or vomiting.    [provider]  Oxycodone HCl 10 MG TABS Take 10 mg by mouth every 4 (four) hours as needed (pain).    [provider]  pantoprazole (PROTONIX) 40 MG tablet Take 1 tablet (40 mg total) by mouth 2 (two) times daily. 12/18/16    Eugenie Filler, MD  polyethylene glycol [facility] / Floria Raveling) packet Take 17 g by mouth 2 (two) times daily. 06/10/17   Eugenie Filler, MD  predniSONE (DELTASONE) 20 MG tablet Take 1-3 tablets (20-60 mg total) by mouth daily before breakfast. Take 3 tablets (60mg ) x 1 day, then 2 tablets (40mg ) x 3 days, then 1 tablet (20mg ) x 3 days then stop. 06/11/17   Eugenie Filler, MD  senna-docusate (SENNA-PLUS) 8.6-50 MG tablet Take 1 tablet by mouth 2 (two) times daily. 06/10/17   Eugenie Filler, MD    Family History Family History  Problem Relation Age of Onset  . Cancer Father   . Diabetes Mellitus II Mother     Social History Social History   Tobacco Use  . Smoking status: Current Some Day Smoker    Packs/day: 0.10    Years: 30.00    Pack years: 3.00    Types: Cigarettes  . Smokeless tobacco: Never Used  . Tobacco comment: 1-2 cig a day   Substance Use Topics  .  Alcohol use: No    Frequency: Never  . Drug use: No     Allergies   Patient has no known allergies.   Review of Systems Review of Systems  Constitutional: Positive for fever.  Respiratory: Positive for cough, shortness of breath and wheezing.   Cardiovascular: Negative for chest pain.  Gastrointestinal: Positive for abdominal pain, nausea and vomiting.  Allergic/Immunologic: Positive for immunocompromised state (chemo).     Physical Exam Updated Vital Signs BP 123/85 (BP Location: Right Arm)   Pulse 88   Temp (!) 97.5 F (36.4 C) (Oral)   Resp 18   Ht 5\' 11"  (1.803 m)   Wt 49.9 kg (110 lb)   SpO2 100%   BMI 15.34 kg/m   Physical Exam  Constitutional: He is oriented to person, place, and time. He appears well-developed and well-nourished. No distress.  HENT:  Head: Normocephalic and atraumatic.  Eyes: Pupils are equal, round, and reactive to light. Conjunctivae are normal. Right eye exhibits no discharge. Left eye exhibits no discharge. No scleral icterus.  Neck: Normal range of motion.    Cardiovascular: Normal rate and regular rhythm.  Pulmonary/Chest: Effort normal and breath sounds normal. Tachypnea noted. No respiratory distress.  Port-a-cath in RU chest wall  Abdominal: Soft. Bowel sounds are normal. He exhibits no distension. There is tenderness (generalized).  Neurological: He is alert and oriented to person, place, and time.  Skin: Skin is warm and dry.  Psychiatric: He has a normal mood and affect. His behavior is normal.  Nursing note and vitals reviewed.    ED Treatments / Results  Labs (all labs ordered are listed, but only abnormal results are displayed) Labs Reviewed  CBC WITH DIFFERENTIAL/PLATELET - Abnormal; Notable for the following components:      Result Value   WBC 3.2 (*)    RBC 2.38 (*)    Hemoglobin 7.9 (*)    HCT 23.7 (*)    All other components within normal limits  BASIC METABOLIC PANEL - Abnormal; Notable for the following components:   CO2 33 (*)    Glucose, Bld 112 (*)    BUN 26 (*)    Creatinine, Ser 1.92 (*)    GFR calc non Af Amer 37 (*)    GFR calc Af Amer 43 (*)    All other components within normal limits  CULTURE, BLOOD (ROUTINE X 2)  CULTURE, BLOOD (ROUTINE X 2)  TROPONIN I  BRAIN NATRIURETIC PEPTIDE    EKG EKG Interpretation  Date/Time:  Saturday June 21 2017 05:13:35 EDT Ventricular Rate:  86 PR Interval:    QRS Duration: 86 QT Interval:  405 QTC Calculation: 485 R Axis:   113 Text Interpretation:  Sinus rhythm Abnormal lateral Q waves Confirmed by Dory Horn) on 06/21/2017 6:06:28 AM   Radiology Dg Chest 2 View  Result Date: 06/21/2017 CLINICAL DATA:  Acute onset of shortness of breath. EXAM: CHEST - 2 VIEW COMPARISON:  Chest radiograph performed 06/05/2017 FINDINGS: A the small right-sided pleural effusion has increased slightly in size. A small left pleural effusion is also seen. Vascular congestion is seen. Increased interstitial markings on the right may reflect asymmetric interstitial edema or  possibly pneumonia. The heart is normal in size. No acute osseous abnormalities are identified. IMPRESSION: 1. Small right-sided pleural effusion has increased slightly in size. Small left pleural effusion also seen. 2. Vascular congestion noted. Increased interstitial markings on the right may reflect asymmetric interstitial edema or possibly pneumonia. Electronically Signed  By: Garald Balding M.D.   On: 06/21/2017 06:10    Procedures Procedures (including critical care time)  Medications Ordered in ED Medications  vancomycin (VANCOCIN) IVPB 1000 mg/200 mL premix (1,000 mg Intravenous New Bag/Given 06/21/17 0852)  albuterol (PROVENTIL) (2.5 MG/3ML) 0.083% nebulizer solution 5 mg (5 mg Nebulization Given 06/21/17 0516)  oxyCODONE-acetaminophen (PERCOCET/ROXICET) 5-325 MG per tablet 2 tablet (2 tablets Oral Given 06/21/17 0654)  ondansetron (ZOFRAN) injection 4 mg (4 mg Intravenous Given 06/21/17 0654)  sodium chloride 0.9 % bolus 1,000 mL (0 mLs Intravenous Stopped 06/21/17 0844)  ceFEPIme (MAXIPIME) 2 g in sodium chloride 0.9 % 100 mL IVPB (0 g Intravenous Stopped 06/21/17 0853)     Initial Impression / Assessment and Plan / ED Course  I have reviewed the triage vital signs and the nursing notes.  Pertinent labs & imaging results that were available during my care of the patient were reviewed by me and considered in my medical decision making (see chart for details).  57 year old male presents with increasing shortness of breath, cough and wheezing.  His vital signs are normal.  He does not have an increased oxygen requirement here.  He is lung sounds are clear however he is tachypneic and he has just received a breathing treatment prior to my evaluation.  CBC is remarkable for pancytopenia.  BMP is remarkable for AKI compared to prior value.  Troponin is normal.  BNP is normal.  EKG is SR with lateral Q waves. Chest x-ray shows bilateral small pleural effusions and increased markings on the right side  consistent with edema versus pneumonia.  Favor pneumonia with his history, normal BMP and normal echo several weeks ago.  Discussed with attending Dr. Randal Buba.  Discussed with Dr. Lonny Prude with the hospitalist service who will come to see the patient.  Final Clinical Impressions(s) / ED Diagnoses   Final diagnoses:  HCAP (healthcare-associated pneumonia)  AKI (acute kidney injury) [facility])    ED Discharge Orders    None       Recardo Evangelist, PA-C 06/21/17 0045    Palumbo, April, MD 06/21/17 2321

## 2017-06-21 NOTE — H&P (Addendum)
History and Physical    [name] HYQ:[id number] DOB: 1960-07-27 DOA: 06/21/2017  PCP: [name], MD  Patient coming from: [place]  Chief Complaint: Dyspnea  HPI: [name] is a 57 y.o. male with medical history significant of combined systolic and diastolic heart failure, Gastritis, COPD, chronic abdominal pain, CKD stage III, hypertension, Stage 4 lung cancer. Patient started having worsened dyspnea yesterday. Patient has a feeling of not being able to breath and gets scared that his oxygen tank is not working. He had associated worsened wheezing, and sputum production that has been unchanged. No associated chest pain. Chills, no fevers.  ED Course: Vitals: Afebrile, normal pulse, normal respirations, normotensive, on 2.5L via [place] Labs: CO2 of 33, BUN of 26 and creatinine of 1.92, hemoglobin of 7.9, WBC of 3.2 (diff pending), BNP of 41.6 Imaging: Chest x-ray with pleural effusions and possible asymmetric edema vs pneumonia Medications/Course: Vancomycin, cefepime, albuterol, oxycodone, 1L NS bolus  Review of Systems: Review of Systems  Constitutional: Positive for chills. Negative for fever.  Respiratory: Positive for cough, sputum production, shortness of breath and wheezing.   Gastrointestinal: Positive for abdominal pain (chronic), diarrhea (improved now) and vomiting (One time two days ago). Negative for constipation.  Psychiatric/Behavioral: The patient is nervous/anxious.     Past Medical History:  Diagnosis Date  . Abdominal pain 06/04/2016  . Adenocarcinoma of left lung, stage 4 ([place]) 05/02/2016  . Alcohol abuse   . Bronchitis due to tobacco use ([place])   . Cancer ([place])    Lung  . COPD (chronic obstructive pulmonary disease) ([place])   . Dehydration 06/04/2016  . Encounter for antineoplastic chemotherapy 05/02/2016  . Gastritis   . Goals of care, counseling/discussion 05/02/2016  . Hematemesis   . HTN (hypertension) 10/30/2016  . Recurrent  upper respiratory infection (URI)   . Seizures ([place]) 05/2011   new onset  . Shortness of breath     Past Surgical History:  Procedure Laterality Date  . ESOPHAGOGASTRODUODENOSCOPY (EGD) WITH PROPOFOL N/A 12/17/2016   Procedure: ESOPHAGOGASTRODUODENOSCOPY (EGD) WITH PROPOFOL;  Surgeon: Ladene Artist, MD;  Location: [facility];  Service: Endoscopy;  Laterality: N/A;  . IR FLUORO GUIDE PORT INSERTION RIGHT  05/13/2016  . IR US GUIDE VASC ACCESS RIGHT  05/13/2016  . NO PAST SURGERIES       reports that he has been smoking cigarettes.  He has a 3.00 pack-year smoking history. He has never used smokeless tobacco. He reports that he does not drink alcohol or use drugs.  No Known Allergies  Family History  Problem Relation Age of Onset  . Cancer Father   . Diabetes Mellitus II Mother     Prior to Admission medications   Medication Sig Start Date End Date Taking? Authorizing Provider  albuterol (PROVENTIL) (2.5 MG/3ML) 0.083% nebulizer solution 1 neb every 4-6 hours as needed for wheezing and shortness of breath Patient taking differently: Take 2.5 mg by nebulization every 4 (four) hours as needed for wheezing or shortness of breath.  04/01/17  Yes Parrett, Tammy S, NP  amLODipine (NORVASC) 10 MG tablet Take 10 mg by mouth daily.   Yes [provider]  Chlorphen-Phenyleph-ASA (ALKA-SELTZER PLUS COLD PO) Take 2 tablets by mouth at bedtime as needed (COUGH).   Yes [provider]  dexamethasone (DECADRON) 4 MG tablet Take 4 mg by mouth 2 (two) times daily. The day before, the day of, and the day after chemotherapy every three weeks   Yes [provider]  escitalopram (LEXAPRO) 5 MG tablet Take 1 tablet (5 mg total) by mouth at bedtime. 04/17/17  Yes Rosita Fire, MD  feeding supplement, ENSURE ENLIVE, (ENSURE ENLIVE) LIQD Take 237 mLs by mouth 3 (three) times daily between meals. 03/19/17  Yes Shelly Coss, MD  fluconazole (DIFLUCAN) 100 MG tablet Take 1  tablet (100 mg total) by mouth daily. Patient taking differently: Take 100 mg by mouth daily. Continuous 03/20/17  Yes Adhikari, Tamsen Meek, MD  Fluticasone-Umeclidin-Vilant (TRELEGY ELLIPTA) 100-62.5-25 MCG/INH AEPB Inhale 1 puff into the lungs daily. 05/31/17  Yes Aline August, MD  furosemide (LASIX) 20 MG tablet Take 1 tablet (20 mg total) by mouth daily. 06/10/17  Yes Eugenie Filler, MD  guaiFENesin (MUCINEX) 600 MG 12 hr tablet Take 600 mg by mouth 2 (two) times daily.   Yes [provider]  guaiFENesin-dextromethorphan (ROBITUSSIN DM) 100-10 MG/5ML syrup Take 5 mLs by mouth every 4 (four) hours as needed for cough. 05/31/17  Yes Aline August, MD  Ipratropium-Albuterol (COMBIVENT) 20-100 MCG/ACT AERS respimat Inhale 1 puff into the lungs every 6 (six) hours. 05/31/17  Yes Aline August, MD  loratadine (CLARITIN) 10 MG tablet Take 10 mg by mouth daily.   Yes [provider]  magnesium hydroxide (MILK OF MAGNESIA) 400 MG/5ML suspension Take 30 mLs by mouth daily. 06/11/17  Yes Eugenie Filler, MD  ondansetron (ZOFRAN-ODT) 4 MG disintegrating tablet Take 4 mg by mouth every 8 (eight) hours as needed for nausea or vomiting.   Yes [provider]  Oxycodone HCl 10 MG TABS Take 10 mg by mouth every 4 (four) hours as needed (pain/shortness of breath).    Yes [provider]  pantoprazole (PROTONIX) 40 MG tablet Take 1 tablet (40 mg total) by mouth 2 (two) times daily. 12/18/16  Yes Eugenie Filler, MD  polyethylene glycol [facility] / Floria Raveling) packet Take 17 g by mouth 2 (two) times daily. 06/10/17  Yes Eugenie Filler, MD  senna-docusate (SENNA-PLUS) 8.6-50 MG tablet Take 1 tablet by mouth 2 (two) times daily. 06/10/17  Yes Eugenie Filler, MD  metoprolol tartrate (LOPRESSOR) 25 MG tablet Take 0.5 tablets (12.5 mg total) by mouth 2 (two) times daily. Patient not taking: Reported on 06/21/2017 06/10/17   Eugenie Filler, MD  predniSONE (DELTASONE) 20 MG tablet  Take 1-3 tablets (20-60 mg total) by mouth daily before breakfast. Take 3 tablets (60mg ) x 1 day, then 2 tablets (40mg ) x 3 days, then 1 tablet (20mg ) x 3 days then stop. Patient not taking: Reported on 06/21/2017 06/11/17   Eugenie Filler, MD    Physical Exam: Vitals:   06/21/17 0700 06/21/17 0730 06/21/17 0800 06/21/17 0830  BP: 124/81 125/83 121/75 128/75  Pulse: 85 86 72 72  Resp: 14 15 14 17   Temp:      TempSrc:      SpO2: 95% 100% 100% 99%  Weight:      Height:        Physical Exam  Constitutional: He is oriented to person, place, and time. He appears well-developed and well-nourished. No distress.  HENT:  Mouth/Throat: Oropharynx is clear and moist and mucous membranes are normal. Abnormal dentition (multiple missing/loose teeth).  Eyes: Pupils are equal, round, and reactive to light. Conjunctivae and EOM are normal.  Neck: Normal range of motion.  Cardiovascular: Normal rate, regular rhythm and normal heart sounds. Exam reveals no decreased pulses.  No murmur heard. Pulmonary/Chest: Effort normal. No accessory muscle usage or  stridor. No tachypnea. No respiratory distress. He has no wheezes. He has no rhonchi. He has rales in the right lower field and the left lower field.  Abdominal: Soft. Bowel sounds are normal. He exhibits no distension. There is tenderness. There is guarding. There is no rebound.  Musculoskeletal: Normal range of motion. He exhibits no edema or tenderness.       Right lower leg: He exhibits no tenderness and no edema.       Left lower leg: He exhibits no tenderness and no edema.  Lymphadenopathy:    He has no cervical adenopathy.  Neurological: He is alert and oriented to person, place, and time.  Skin: Skin is warm and dry. He is not diaphoretic.  Psychiatric: His speech is normal. Judgment and thought content normal. He is slowed. Cognition and memory are normal. He exhibits a depressed mood.    Labs on Admission: I have personally reviewed  following labs and imaging studies  CBC: Recent Labs  Lab 06/21/17 0539  WBC 3.2*  NEUTROABS PENDING  HGB 7.9*  HCT 23.7*  MCV 99.6  PLT PENDING   Basic Metabolic Panel: Recent Labs  Lab 06/21/17 0539  NA 142  K 4.2  CL 103  CO2 33*  GLUCOSE 112*  BUN 26*  CREATININE 1.92*  CALCIUM 9.2   GFR: Estimated Creatinine Clearance: 30 mL/min (A) (by C-G formula based on SCr of 1.92 mg/dL (H)). Liver Function Tests: No results for input(s): AST, ALT, ALKPHOS, BILITOT, PROT, ALBUMIN in the last 168 hours. No results for input(s): LIPASE, AMYLASE in the last 168 hours. No results for input(s): AMMONIA in the last 168 hours. Coagulation Profile: No results for input(s): INR, PROTIME in the last 168 hours. Cardiac Enzymes: Recent Labs  Lab 06/21/17 0539  TROPONINI <0.03   Urine analysis:    Component Value Date/Time   COLORURINE YELLOW 05/20/2017 0940   APPEARANCEUR CLEAR 05/20/2017 0940   LABSPEC 1.015 05/20/2017 0940   PHURINE 5.0 05/20/2017 0940   GLUCOSEU NEGATIVE 05/20/2017 0940   HGBUR NEGATIVE 05/20/2017 0940   BILIRUBINUR NEGATIVE 05/20/2017 0940   KETONESUR 5 (A) 05/20/2017 0940   PROTEINUR 30 (A) 05/20/2017 0940   UROBILINOGEN 0.2 05/29/2011 0122   NITRITE NEGATIVE 05/20/2017 0940   LEUKOCYTESUR NEGATIVE 05/20/2017 0940   Radiological Exams on Admission: Dg Chest 2 View  Result Date: 06/21/2017 CLINICAL DATA:  Acute onset of shortness of breath. EXAM: CHEST - 2 VIEW COMPARISON:  Chest radiograph performed 06/05/2017 FINDINGS: A the small right-sided pleural effusion has increased slightly in size. A small left pleural effusion is also seen. Vascular congestion is seen. Increased interstitial markings on the right may reflect asymmetric interstitial edema or possibly pneumonia. The heart is normal in size. No acute osseous abnormalities are identified. IMPRESSION: 1. Small right-sided pleural effusion has increased slightly in size. Small left pleural effusion  also seen. 2. Vascular congestion noted. Increased interstitial markings on the right may reflect asymmetric interstitial edema or possibly pneumonia. Electronically Signed   By: Garald Balding M.D.   On: 06/21/2017 06:10    EKG: Independently reviewed. Sinus rhythm  Assessment/Plan Principal Problem:   Dyspnea Active Problems:   Adenocarcinoma of left lung, stage 4 (HCC)   Abdominal pain   Chronic obstructive pulmonary disease (HCC)   HTN (hypertension)   Erosive gastropathy   Gastritis   CKD (chronic kidney disease), stage III (HCC)   Chronic combined systolic (congestive) and diastolic (congestive) heart failure (HCC)   Dyspnea No clear  etiology. Cannot use procalcitonin secondary to active cancer treatment. This could be anxiety mediated. Chest x-ray findings likely from previous pneumonia vs cancer. Does not appear to be a COPD exacerbation. Has pleural effusions which could be contributing. Improved with nebulizer treatment. -Hold antibiotics and observe overnight -Nebulizer treatments prn -Ativan prn anxiety, dyspnea. If improves, could titrate up Lexapro for long term management -Lasix 40 mg x1  Chronic respiratory failure Currently around baseline as patient uses 2-3 L daily.  COPD Possible exacerbation. Sputum production is stable, however. No wheezing, but is s/p albuterol. Per chart review, he had wheezing prior to arrival. -Continue nebulizer treatments for now -Continue Trelegy ellipta -Continue Robitussin  Stage IV lung cancer Currently undergoing chemotherapy q3 weeks. [name] is his oncologist.  Abdominal pain Patient with a multiple month history of this issue. He reports some diarrhea recently that has now resolved. Has a history of constipation and takes opiates consistently. -Abdominal x-ray to rule out obstruction and to assess stool burden -If no obstruction, will restart home Senna, Miralax  Chronic combined systolic and diastolic heart  failure Last EF of 55-60% with grade 2 diastolic dysfunction on 0/6237. He is not fluid overloaded currently on exam except he does have some rales, worse on the left side. Possibly secondary to infection, rather than heart etiology. BNP 41 in non-obese patient. Given a 1L NS bolus. -Daily weights/strict in and out -Continue home lasix tomorrow (Giving IV dose today)  Acute kidney injury on CKD III Patient is euvolemic on exam. Given a NS bolus in the ED -Repeat BMP in AM -Hold fluids  GERD/gastritis Patient empirically treated with Protonix and Fluconazole -Continue protonix -Discontinue fluconazole.  Essential hypertension Controlled. Per patient, he is not taking metoprolol. -Continue home amlodipine -PCP to consider ACEi/ARB for heart failure history  Anemia Chronic. Stable. No history of bleeding.  Leukopenia Patient is on chemotherapy. Mild. Differential significant for normal neutrophil count. Afebrile.   DVT prophylaxis: Lovenox Code Status: DNR Family Communication: None at bedside Disposition Plan: Discharge to SNF in AM if continues to be stable Consults called: None Admission status: Observation, medical floor   Cordelia Poche, MD [facility] Pager [phone]  If 7PM-7AM, please contact night-coverage [email or web address] Password [email or web address]  06/21/2017, 8:55 AM

## 2017-06-21 NOTE — ED Notes (Signed)
Bed: TT01 Expected date:  Expected time:  Means of arrival:  Comments: EMS 57 yo male from assisted living [facility]-hx lung cancer-abdominal pain/wheezing albuterol neb

## 2017-06-21 NOTE — Progress Notes (Signed)
A consult was received from an ED provider for vancomycin  per pharmacy dosing.  The patient's profile has been reviewed for ht/wt/allergies/indication/available labs.   A one time order has been placed for vancomcycin 1gm x1.   Adjust cefepime to 2gm IV x 1  Further antibiotics/pharmacy consults should be ordered by admitting physician if indicated.                       Thank you, Doreene Eland, PharmD, BCPS.   06/21/2017 8:18 AM

## 2017-06-21 NOTE — ED Notes (Signed)
RN called for report and unable to take report at this time.

## 2017-06-22 DIAGNOSIS — I5042 Chronic combined systolic (congestive) and diastolic (congestive) heart failure: Secondary | ICD-10-CM | POA: Diagnosis not present

## 2017-06-22 DIAGNOSIS — K2970 Gastritis, unspecified, without bleeding: Secondary | ICD-10-CM

## 2017-06-22 DIAGNOSIS — R1013 Epigastric pain: Secondary | ICD-10-CM | POA: Diagnosis not present

## 2017-06-22 DIAGNOSIS — K3189 Other diseases of stomach and duodenum: Secondary | ICD-10-CM | POA: Diagnosis not present

## 2017-06-22 DIAGNOSIS — I10 Essential (primary) hypertension: Secondary | ICD-10-CM

## 2017-06-22 DIAGNOSIS — C3492 Malignant neoplasm of unspecified part of left bronchus or lung: Secondary | ICD-10-CM | POA: Diagnosis not present

## 2017-06-22 DIAGNOSIS — J449 Chronic obstructive pulmonary disease, unspecified: Secondary | ICD-10-CM

## 2017-06-22 DIAGNOSIS — R0602 Shortness of breath: Secondary | ICD-10-CM | POA: Diagnosis not present

## 2017-06-22 DIAGNOSIS — N183 Chronic kidney disease, stage 3 (moderate): Secondary | ICD-10-CM

## 2017-06-22 LAB — BASIC METABOLIC PANEL
ANION GAP: 9 (ref 5–15)
BUN: 17 mg/dL (ref 6–20)
CO2: 27 mmol/L (ref 22–32)
Calcium: 8.7 mg/dL — ABNORMAL LOW (ref 8.9–10.3)
Chloride: 101 mmol/L (ref 101–111)
Creatinine, Ser: 1.83 mg/dL — ABNORMAL HIGH (ref 0.61–1.24)
GFR calc Af Amer: 46 mL/min — ABNORMAL LOW (ref >60)
GFR calc non Af Amer: 39 mL/min — ABNORMAL LOW (ref >60)
GLUCOSE: 99 mg/dL (ref 65–99)
POTASSIUM: 4 mmol/L (ref 3.5–5.1)
Sodium: 137 mmol/L (ref 135–145)

## 2017-06-22 LAB — CBC
HEMATOCRIT: 22.4 % — AB (ref 39.0–52.0)
HEMOGLOBIN: 7.3 g/dL — AB (ref 13.0–17.0)
MCH: 33 pg (ref 26.0–34.0)
MCHC: 32.6 g/dL (ref 30.0–36.0)
MCV: 101.4 fL — ABNORMAL HIGH (ref 78.0–100.0)
Platelets: 112 10*3/uL — ABNORMAL LOW (ref 150–400)
RBC: 2.21 MIL/uL — ABNORMAL LOW (ref 4.22–5.81)
RDW: 15.2 % (ref 11.5–15.5)
WBC: 2.9 10*3/uL — ABNORMAL LOW (ref 4.0–10.5)

## 2017-06-22 LAB — HEMOGLOBIN AND HEMATOCRIT, BLOOD
HEMATOCRIT: 28.9 % — AB (ref 39.0–52.0)
HEMOGLOBIN: 9.4 g/dL — AB (ref 13.0–17.0)

## 2017-06-22 LAB — PREPARE RBC (CROSSMATCH)

## 2017-06-22 MED ORDER — FUROSEMIDE 10 MG/ML IJ SOLN
20.0000 mg | Freq: Once | INTRAMUSCULAR | Status: AC
  Administered 2017-06-22: 20 mg via INTRAVENOUS
  Filled 2017-06-22: qty 2

## 2017-06-22 MED ORDER — SODIUM CHLORIDE 0.9 % IV SOLN
Freq: Once | INTRAVENOUS | Status: AC
  Administered 2017-06-22: 16:00:00 via INTRAVENOUS
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
## 2017-06-23 ENCOUNTER — Telehealth: Payer: Self-pay | Admitting: Internal Medicine

## 2017-06-23 ENCOUNTER — Inpatient Hospital Stay: Payer: Medicaid Other

## 2017-06-23 ENCOUNTER — Inpatient Hospital Stay: Payer: Medicaid Other | Admitting: Internal Medicine

## 2017-06-23 DIAGNOSIS — R1013 Epigastric pain: Secondary | ICD-10-CM | POA: Diagnosis not present

## 2017-06-23 DIAGNOSIS — C3492 Malignant neoplasm of unspecified part of left bronchus or lung: Secondary | ICD-10-CM | POA: Diagnosis not present

## 2017-06-23 DIAGNOSIS — R0602 Shortness of breath: Secondary | ICD-10-CM | POA: Diagnosis not present

## 2017-06-23 DIAGNOSIS — I5042 Chronic combined systolic (congestive) and diastolic (congestive) heart failure: Secondary | ICD-10-CM | POA: Diagnosis not present

## 2017-06-23 LAB — TYPE AND SCREEN
ABO/RH(D): O POS
ANTIBODY SCREEN: NEGATIVE
Unit division: 0

## 2017-06-23 LAB — BASIC METABOLIC PANEL
Anion gap: 11 (ref 5–15)
BUN: 15 mg/dL (ref 6–20)
CALCIUM: 9.1 mg/dL (ref 8.9–10.3)
CO2: 30 mmol/L (ref 22–32)
CREATININE: 1.72 mg/dL — AB (ref 0.61–1.24)
Chloride: 99 mmol/L — ABNORMAL LOW (ref 101–111)
GFR calc non Af Amer: 42 mL/min — ABNORMAL LOW (ref >60)
GFR, EST AFRICAN AMERICAN: 49 mL/min — AB (ref >60)
Glucose, Bld: 82 mg/dL (ref 65–99)
Potassium: 4.1 mmol/L (ref 3.5–5.1)
SODIUM: 140 mmol/L (ref 135–145)

## 2017-06-23 LAB — CBC
HCT: 26.9 % — ABNORMAL LOW (ref 39.0–52.0)
Hemoglobin: 8.7 g/dL — ABNORMAL LOW (ref 13.0–17.0)
MCH: 32.2 pg (ref 26.0–34.0)
MCHC: 32.3 g/dL (ref 30.0–36.0)
MCV: 99.6 fL (ref 78.0–100.0)
Platelets: 143 10*3/uL — ABNORMAL LOW (ref 150–400)
RBC: 2.7 MIL/uL — ABNORMAL LOW (ref 4.22–5.81)
RDW: 15.8 % — AB (ref 11.5–15.5)
WBC: 3.3 10*3/uL — ABNORMAL LOW (ref 4.0–10.5)

## 2017-06-23 LAB — BPAM RBC
BLOOD PRODUCT EXPIRATION DATE: 201906282359
ISSUE DATE / TIME: 201906021513
Unit Type and Rh: 5100

## 2017-06-23 MED ORDER — HEPARIN SOD (PORK) LOCK FLUSH 100 UNIT/ML IV SOLN: 500.0000 [IU] | INTRAVENOUS | Status: DC

## 2017-06-23 MED ORDER — ADULT MULTIVITAMIN W/MINERALS CH: 1.0000 | ORAL_TABLET | Freq: Every day | ORAL | Status: DC

## 2017-06-23 MED ORDER — HEPARIN SOD (PORK) LOCK FLUSH 100 UNIT/ML IV SOLN
500.0000 [IU] | INTRAVENOUS | Status: DC | PRN
  Filled 2017-06-23: qty 5

## 2017-06-23 NOTE — Progress Notes (Signed)
LCSW following for facility placement.   Patient will return to [name].   LCSW confirmed return with facility.   LCSW faxed dc docs to facility.   Patient will transport by [facility].  RN report #: [place], Shawna Clamp [place] [phone]

## 2017-06-23 NOTE — Progress Notes (Signed)
Report called to [name], Therapist, sports at [name].

## 2017-06-23 NOTE — Progress Notes (Signed)
Initial Nutrition Assessment  DOCUMENTATION CODES:   Non-severe (moderate) malnutrition in context of chronic illness, Underweight  INTERVENTION:   -Continue Ensure Enlive po TID, each supplement provides 350 kcal and 20 grams of protein -Provide Multivitamin with minerals daily  NUTRITION DIAGNOSIS:   Moderate Malnutrition related to cancer and cancer related treatments, chronic illness as evidenced by moderate fat depletion, moderate muscle depletion, percent weight loss.  GOAL:   Patient will meet greater than or equal to 90% of their needs  MONITOR:   PO intake, Supplement acceptance, Labs, Weight trends, I & O's  REASON FOR ASSESSMENT:   (Low BMI)    ASSESSMENT:   57 y.o. male with medical history significant of combined systolic and diastolic heart failure, Gastritis, COPD, chronic abdominal pain, CKD stage III, hypertension, Stage 4 lung cancer.   Patient with history of malnutrition. Was last seen by nutrition during previous admission in May. Pt was consuming 100% of meals at that time. Currently eating ~50% of meals this admission. Drinking Ensure supplements. Pt with poor dentition as well.   Per chart review, pt has lost 16 lb since February 2019 (13% wt loss x 3.5 months, significant for time frame).     Labs reviewed. Medications:  IV Zofran PRN  NUTRITION - FOCUSED PHYSICAL EXAM:    Most Recent Value  Orbital Region  No depletion  Upper Arm Region  Moderate depletion  Thoracic and Lumbar Region  Unable to assess  Buccal Region  No depletion  Temple Region  Mild depletion  Clavicle Bone Region  Moderate depletion  Clavicle and Acromion Bone Region  Moderate depletion  Scapular Bone Region  Mild depletion  Dorsal Hand  Moderate depletion  Patellar Region  Moderate depletion  Anterior Thigh Region  Moderate depletion  Posterior Calf Region  Moderate depletion       Diet Order:   Diet Order           Diet Heart Room service appropriate? Yes;  Fluid consistency: Thin  Diet effective now          EDUCATION NEEDS:   No education needs have been identified at this time  Skin:  Skin Assessment: Reviewed RN Assessment  Last BM:  6/1  Height:   Ht Readings from Last 1 Encounters:  06/21/17 5\' 11"  (1.803 m)    Weight:   Wt Readings from Last 1 Encounters:  06/21/17 110 lb (49.9 kg)    Ideal Body Weight:  78.1 kg  BMI:  Body mass index is 15.34 kg/m.  Estimated Nutritional Needs:   Kcal:  1761-6073  Protein:  80-90g  Fluid:  1.9L/day  Clayton Bibles, MS, RD, LDN [facility] Pager: [phone] After Hours Pager: [phone]

## 2017-06-23 NOTE — Clinical Social Work Note (Signed)
Patient accessed on 5/11 and again on 5/18, by CSW no significant changes since most recent dc. Patient under obs. LCSW called at dc to assisted with return to facility. Patient will return to ALF.   [place] CSW [phone]  Patient assessed on 5/11 by CSW at [facility]. LCSW met at bedside with patient. No significant changes since previous assessment.   Patient plans to return to [name] at [facility]. Patient complains of pain and nausea at the time of assessment.   Patient states that he needs a rollater and that his 2-wheeled walker is not helpful. Patient reports he uses oxygen at the facility.   Patient states that it has been very rough for him and all he can do is " keep praying"   PLAN: Patient will return to ALF at DC.   Clinical Social Work Assessment  Patient Details  Name: [name] MRN: [id number] Date of Birth: 26-Dec-1960  Date of referral:                  Reason for consult:                   Permission sought to share information with:    Permission granted to share information::     Name::        Agency::     Relationship::     Contact Information:     Housing/Transportation Living arrangements for the past 2 months:    Source of Information:    Patient Interpreter Needed:    Criminal Activity/Legal Involvement Pertinent to Current Situation/Hospitalization:    Significant Relationships:    Lives with:    Do you feel safe going back to the place where you live?    Need for family participation in patient care:     Care giving concerns:  Patient had no family at bedside. patient stated he is from [place] and would like to return back to facility  Facilities manager / plan:  CSW reached out to facility to verify they would be able to take patient back.CSW spoke with [facility] and she stated they will be abel to take patient back once summary has been faxed to them. CSW faxed summary and fl2 to facility.  Patient  requested a Rolator from [place] stating that he feels more comfortable with the equipment because it allows him to set down when he gets tired. CSW relayed patients request to [facility]. CSW will contact [facility] on behalf of patient for transport  Employment status:    Insurance information:    PT Recommendations:    Information / Referral to community resources:     Patient/Family's Response to care:  Patient eager to go home  Patient/Family's Understanding of and Emotional Response to Diagnosis, Current Treatment, and Prognosis:  Patient wants to return back to facility  Emotional Assessment Appearance:    Attitude/Demeanor/Rapport:    Affect (typically observed):    Orientation:    Alcohol / Substance use:    Psych involvement (Current and /or in the community):     Discharge Needs  Concerns to be addressed:    Readmission within the last 30 days:    Current discharge risk:    Barriers to Discharge:      Servando Snare, LCSW 06/23/2017, 2:31 PM

## 2017-06-23 NOTE — Telephone Encounter (Signed)
Scheduled appt per 6/3 sch message - pt is aware of appt date and time.

## 2017-06-23 NOTE — Discharge Instructions (Signed)
Lung Cancer Lung cancer occurs when abnormal cells in the lung grow out of control and form a mass (tumor). There are several types of lung cancer. The two most common types are:  Non-small cell. In this type of lung cancer, abnormal cells are larger and grow more slowly than those of small cell lung cancer.  Small cell. In this type of lung cancer, abnormal cells are smaller than those of non-small cell lung cancer. Small cell lung cancer gets worse faster than non-small cell lung cancer.  What are the causes? The leading cause of lung cancer is smoking tobacco. The second leading cause is radon exposure. What increases the risk?  Smoking tobacco.  Exposure to secondhand tobacco smoke.  Exposure to radon gas.  Exposure to asbestos.  Exposure to arsenic in drinking water.  Air pollution.  Family or personal history of lung cancer.  Lung radiation therapy.  Being older than 65 years. What are the signs or symptoms? In the early stages, symptoms may not be present. As the cancer progresses, symptoms may include:  A lasting cough, possibly with blood.  Fatigue.  Unexplained weight loss.  Shortness of breath.  Wheezing.  Chest pain.  Loss of appetite.  Symptoms of advanced lung cancer include:  Hoarseness.  Bone or joint pain.  Weakness.  Nail problems.  Face or arm swelling.  Paralysis of the face.  Drooping eyelids.  How is this diagnosed? Lung cancer can be identified with a physical exam and with tests such as:  A chest X-ray.  A CT scan.  Blood tests.  A biopsy.  After a diagnosis is made, you will have more tests to determine the stage of the cancer. The stages of non-small cell lung cancer are:  Stage 0, also called carcinoma in situ. At this stage, abnormal cells are found in the inner lining of your lung or lungs.  Stage I. At this stage, abnormal cells have grown into a tumor that is no larger than 5 cm across. The cancer has entered  the deeper lung tissue but has not yet entered the lymph nodes or other parts of the body.  Stage II. At this stage, the tumor is 7 cm across or smaller and has entered nearby lymph nodes. Or, the tumor is 5 cm across or smaller and has invaded surrounding tissue but is not found in nearby lymph nodes. There may be more than one tumor present.  Stage III. At this stage, the tumor may be any size. There may be more than one tumor in the lungs. The cancer cells have spread to the lymph nodes and possibly to other organs.  Stage IV. At this stage, there are tumors in both lungs and the cancer has spread to other areas of the body.  The stages of small cell lung cancer are:  Limited. At this stage, the cancer is found only on one side of the chest.  Extensive. At this stage, the cancer is in the lungs and in tissues on the other side of the chest. The cancer has spread to other organs or is found in the fluid between the layers of your lungs.  How is this treated? Depending on the type and stage of your lung cancer, you may be treated with:  Surgery. This is done to remove a tumor.  Radiation therapy. This treatment destroys cancer cells using X-rays or other types of radiation.  Chemotherapy. This treatment uses medicines to destroy cancer cells.  Targeted therapy. This treatment   aims to destroy only cancer cells instead of all cells as other therapies do.  You may also have a combination of treatments. Follow these instructions at home:  Do not use any tobacco products. This includes cigarettes, chewing tobacco, and electronic cigarettes. If you need help quitting, ask your health care provider.  Take medicines only as directed by your health care provider.  Eat a healthy diet. Work with a dietitian to make sure you are getting the nutrition you need.  Consider joining a support group or seeking counseling to help you cope with the stress of having lung cancer.  Let your cancer  specialist (oncologist) know if you are admitted to the hospital.  Keep all follow-up visits as directed by your health care provider. This is important. Contact a health care provider if:  You lose weight without trying.  You have a persistent cough and wheezing.  You feel short of breath.  You tire easily.  You experience bone or joint pain.  You have difficulty swallowing.  You feel hoarse or notice your voice changing.  Your pain medicine is not helping. Get help right away if:  You cough up blood.  You have new breathing problems.  You develop chest pain.  You develop swelling in: ? One or both ankles or legs. ? Your face, neck, or arms.  You are confused.  You experience paralysis in your face or a drooping eyelid. This information is not intended to replace advice given to you by your health care provider. Make sure you discuss any questions you have with your health care provider. Document Released: 04/15/2000 Document Revised: 06/15/2015 Document Reviewed: 05/13/2013 Elsevier Interactive Patient Education  2018 [employer]  
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
## 2017-06-24 ENCOUNTER — Emergency Department (HOSPITAL_COMMUNITY): Payer: Medicaid Other

## 2017-06-24 ENCOUNTER — Encounter (HOSPITAL_COMMUNITY): Payer: Self-pay

## 2017-06-24 ENCOUNTER — Other Ambulatory Visit: Payer: Medicaid Other

## 2017-06-24 ENCOUNTER — Ambulatory Visit: Payer: Medicaid Other | Admitting: Internal Medicine

## 2017-06-24 ENCOUNTER — Inpatient Hospital Stay: Payer: Medicaid Other

## 2017-06-24 ENCOUNTER — Emergency Department (HOSPITAL_COMMUNITY)
Admission: EM | Admit: 2017-06-24 | Discharge: 2017-06-24 | Disposition: A | Discharge status: Home / Self Care | Payer: Medicaid Other | Attending: Emergency Medicine | Admitting: Emergency Medicine

## 2017-06-24 ENCOUNTER — Other Ambulatory Visit: Payer: Self-pay

## 2017-06-24 DIAGNOSIS — D649 Anemia, unspecified: Secondary | ICD-10-CM | POA: Diagnosis not present

## 2017-06-24 DIAGNOSIS — I5040 Unspecified combined systolic (congestive) and diastolic (congestive) heart failure: Secondary | ICD-10-CM | POA: Diagnosis not present

## 2017-06-24 DIAGNOSIS — N289 Disorder of kidney and ureter, unspecified: Secondary | ICD-10-CM | POA: Insufficient documentation

## 2017-06-24 DIAGNOSIS — I130 Hypertensive heart and chronic kidney disease with heart failure and stage 1 through stage 4 chronic kidney disease, or unspecified chronic kidney disease: Secondary | ICD-10-CM | POA: Insufficient documentation

## 2017-06-24 DIAGNOSIS — R531 Weakness: Secondary | ICD-10-CM

## 2017-06-24 DIAGNOSIS — Z85118 Personal history of other malignant neoplasm of bronchus and lung: Secondary | ICD-10-CM | POA: Insufficient documentation

## 2017-06-24 DIAGNOSIS — N183 Chronic kidney disease, stage 3 (moderate): Secondary | ICD-10-CM | POA: Diagnosis not present

## 2017-06-24 DIAGNOSIS — Z79899 Other long term (current) drug therapy: Secondary | ICD-10-CM | POA: Insufficient documentation

## 2017-06-24 DIAGNOSIS — R0602 Shortness of breath: Secondary | ICD-10-CM | POA: Diagnosis present

## 2017-06-24 DIAGNOSIS — F17210 Nicotine dependence, cigarettes, uncomplicated: Secondary | ICD-10-CM | POA: Insufficient documentation

## 2017-06-24 DIAGNOSIS — J441 Chronic obstructive pulmonary disease with (acute) exacerbation: Secondary | ICD-10-CM

## 2017-06-24 LAB — BASIC METABOLIC PANEL
Anion gap: 12 (ref 5–15)
BUN: 16 mg/dL (ref 6–20)
CO2: 27 mmol/L (ref 22–32)
CREATININE: 1.72 mg/dL — AB (ref 0.61–1.24)
Calcium: 9.3 mg/dL (ref 8.9–10.3)
Chloride: 97 mmol/L — ABNORMAL LOW (ref 101–111)
GFR, EST AFRICAN AMERICAN: 49 mL/min — AB (ref >60)
GFR, EST NON AFRICAN AMERICAN: 42 mL/min — AB (ref >60)
Glucose, Bld: 101 mg/dL — ABNORMAL HIGH (ref 65–99)
Potassium: 4.1 mmol/L (ref 3.5–5.1)
SODIUM: 136 mmol/L (ref 135–145)

## 2017-06-24 LAB — CBC WITH DIFFERENTIAL/PLATELET
BASOS ABS: 0 10*3/uL (ref 0.0–0.1)
Basophils Relative: 0 %
EOS ABS: 0 10*3/uL (ref 0.0–0.7)
Eosinophils Relative: 1 %
HCT: 29.3 % — ABNORMAL LOW (ref 39.0–52.0)
Hemoglobin: 9.7 g/dL — ABNORMAL LOW (ref 13.0–17.0)
LYMPHS ABS: 0.6 10*3/uL — AB (ref 0.7–4.0)
Lymphocytes Relative: 12 %
MCH: 32.8 pg (ref 26.0–34.0)
MCHC: 33.1 g/dL (ref 30.0–36.0)
MCV: 99 fL (ref 78.0–100.0)
MONO ABS: 0.6 10*3/uL (ref 0.1–1.0)
Monocytes Relative: 13 %
NEUTROS PCT: 74 %
Neutro Abs: 3.6 10*3/uL (ref 1.7–7.7)
PLATELETS: 188 10*3/uL (ref 150–400)
RBC: 2.96 MIL/uL — AB (ref 4.22–5.81)
RDW: 15.1 % (ref 11.5–15.5)
WBC: 4.8 10*3/uL (ref 4.0–10.5)

## 2017-06-24 LAB — BRAIN NATRIURETIC PEPTIDE: B NATRIURETIC PEPTIDE 5: 54.5 pg/mL (ref 0.0–100.0)

## 2017-06-24 MED ORDER — OXYCODONE-ACETAMINOPHEN 5-325 MG PO TABS
1.0000 | ORAL_TABLET | Freq: Once | ORAL | Status: AC
  Administered 2017-06-24: 1 via ORAL
  Filled 2017-06-24: qty 1

## 2017-06-24 MED ORDER — METHYLPREDNISOLONE SODIUM SUCC 125 MG IJ SOLR
125.0000 mg | Freq: Once | INTRAMUSCULAR | Status: AC
  Administered 2017-06-24: 125 mg via INTRAVENOUS
  Filled 2017-06-24: qty 2

## 2017-06-24 MED ORDER — IPRATROPIUM-ALBUTEROL 0.5-2.5 (3) MG/3ML IN SOLN
3.0000 mL | Freq: Once | RESPIRATORY_TRACT | Status: AC
  Administered 2017-06-24: 3 mL via RESPIRATORY_TRACT
  Filled 2017-06-24: qty 3

## 2017-06-24 MED ORDER — IPRATROPIUM-ALBUTEROL 0.5-2.5 (3) MG/3ML IN SOLN
3.0000 mL | Freq: Once | RESPIRATORY_TRACT | Status: AC
  Administered 2017-06-24: 3 mL via RESPIRATORY_TRACT

## 2017-06-24 MED ORDER — ONDANSETRON 4 MG PO TBDP
4.0000 mg | ORAL_TABLET | Freq: Once | ORAL | Status: AC
  Administered 2017-06-24: 4 mg via ORAL
  Filled 2017-06-24: qty 1

## 2017-06-24 MED ORDER — FUROSEMIDE 10 MG/ML IJ SOLN
40.0000 mg | Freq: Once | INTRAMUSCULAR | Status: AC
  Administered 2017-06-24: 40 mg via INTRAVENOUS
  Filled 2017-06-24: qty 4

## 2017-06-24 NOTE — Discharge Instructions (Addendum)
Your work-up today showed evidence of COPD exacerbation.  Since her oxygen was able to be maintained on home oxygen, the hospitalist team felt you are safe for discharge home.  After our conversation, we agreed you are safe for discharge home.  If any symptoms change or worsen, please return to the nearest emergency department.  Please use the physical therapy at your facility.  Please continue your home medications.  Please follow-up with your primary care physician in the next few days.
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
## 2017-06-24 NOTE — ED Notes (Signed)
Spoke to [facility]. Patient is on the list to be transported. It will be a while PTAR said.
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
## 2017-06-24 NOTE — ED Provider Notes (Signed)
[facility] DEPT Provider Note   CSN: [id number] Arrival date & time: 06/24/17  0329     History   Chief Complaint Chief Complaint  Patient presents with  . Shortness of Breath    HPI [name] is a 57 y.o. male.  The history is provided by the patient.  He has a history of stage IV lung cancer, COPD, hypertension, seizures and comes in because of shortness of breath.  He says that he felt woozy at about 11 PM and turned on his oxygen tank.  It did not seem to help so he tried another oxygen tank.  He did not feel any better, EMS was called and noted that the oxygen tank was actually not turned on.  He was placed on nasal oxygen by EMS and oxygen saturation came up to 96%.  However, he states he is really not feeling any better.  He says he was running a fever earlier today, but does not know how high that his temperature was.  He has a chronic cough productive of green sputum, and this is not any different today.  He is somewhat more dyspneic at the moment than his baseline.  Past Medical History:  Diagnosis Date  . Abdominal pain 06/04/2016  . Adenocarcinoma of left lung, stage 4 ([place]) 05/02/2016  . Alcohol abuse   . Bronchitis due to tobacco use ([place])   . Cancer ([place])    Lung  . COPD (chronic obstructive pulmonary disease) ([place])   . Dehydration 06/04/2016  . Encounter for antineoplastic chemotherapy 05/02/2016  . Gastritis   . Goals of care, counseling/discussion 05/02/2016  . Hematemesis   . HTN (hypertension) 10/30/2016  . Recurrent upper respiratory infection (URI)   . Seizures ([place]) 05/2011   new onset  . Shortness of breath     Patient Active Problem List   Diagnosis Date Noted  . Dyspnea 06/21/2017  . Chronic combined systolic (congestive) and diastolic (congestive) heart failure ([name])   . Constipation 06/07/2017  . Pain   . Chronic respiratory failure with hypoxia ([place]) 04/01/2017  . Leucocytosis 03/16/2017  . CKD (chronic  kidney disease), stage III ([place]) 03/12/2017  . Malnutrition of moderate degree 03/10/2017  . COPD with acute exacerbation ([place]) 03/09/2017  . Anemia 02/07/2017  . Severe malnutrition ([place]) 01/03/2017  . DOE (dyspnea on exertion) 12/30/2016  . Nausea   . Hypervolemia   . HCAP (healthcare-associated pneumonia) 12/19/2016  . SOB (shortness of breath) 12/19/2016  . Erosive gastropathy 12/18/2016  . Gastritis 12/18/2016  . Hematemesis 12/16/2016  . Intractable nausea and vomiting 12/16/2016  . HTN (hypertension) 10/30/2016  . Chronic obstructive pulmonary disease ([place]) 09/02/2016  . COPD (chronic obstructive pulmonary disease) ([place]) 08/19/2016  . Dehydration 06/04/2016  . Abdominal pain 06/04/2016  . 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 Adenocarcinoma of left lung, stage 4 ([place]) 05/02/2016  . Goals of care, counseling/discussion 05/02/2016  . Encounter for antineoplastic chemotherapy 05/02/2016  . Tobacco abuse 05/30/2011  . Alcohol abuse 05/30/2011  . Seizure ([place]) 05/28/2011    Past Surgical History:  Procedure Laterality Date  . ESOPHAGOGASTRODUODENOSCOPY (EGD) WITH PROPOFOL N/A 12/17/2016   Procedure: ESOPHAGOGASTRODUODENOSCOPY (EGD) WITH PROPOFOL;  Surgeon: Ladene Artist, MD;  Location: [facility] ENDOSCOPY;  Service: Endoscopy;  Laterality: N/A;  . IR FLUORO GUIDE PORT INSERTION RIGHT  05/13/2016  . IR US GUIDE VASC ACCESS RIGHT  05/13/2016  . NO PAST SURGERIES          Home Medications  Prior to Admission medications   Medication Sig Start Date End Date Taking? Authorizing Provider  albuterol (PROVENTIL) (2.5 MG/3ML) 0.083% nebulizer solution 1 neb every 4-6 hours as needed for wheezing and shortness of breath Patient taking differently: Take 2.5 mg by nebulization every 4 (four) hours as needed for wheezing or shortness of breath.  04/01/17   Parrett, Fonnie Mu, NP  amLODipine (NORVASC) 10 MG tablet Take 10 mg by mouth daily.    [provider]    Chlorphen-Phenyleph-ASA (ALKA-SELTZER PLUS COLD PO) Take 2 tablets by mouth at bedtime as needed (COUGH).    [provider]  dexamethasone (DECADRON) 4 MG tablet Take 4 mg by mouth 2 (two) times daily. The day before, the day of, and the day after chemotherapy every three weeks    [provider]  escitalopram (LEXAPRO) 5 MG tablet Take 1 tablet (5 mg total) by mouth at bedtime. 04/17/17   Rosita Fire, MD  feeding supplement, ENSURE ENLIVE, (ENSURE ENLIVE) LIQD Take 237 mLs by mouth 3 (three) times daily between meals. 03/19/17   Shelly Coss, MD  fluconazole (DIFLUCAN) 100 MG tablet Take 1 tablet (100 mg total) by mouth daily. Patient taking differently: Take 100 mg by mouth daily. Continuous 03/20/17   Shelly Coss, MD  Fluticasone-Umeclidin-Vilant (TRELEGY ELLIPTA) 100-62.5-25 MCG/INH AEPB Inhale 1 puff into the lungs daily. 05/31/17   Aline August, MD  furosemide (LASIX) 20 MG tablet Take 1 tablet (20 mg total) by mouth daily. 06/10/17   Eugenie Filler, MD  guaiFENesin (MUCINEX) 600 MG 12 hr tablet Take 600 mg by mouth 2 (two) times daily.    [provider]  guaiFENesin-dextromethorphan (ROBITUSSIN DM) 100-10 MG/5ML syrup Take 5 mLs by mouth every 4 (four) hours as needed for cough. 05/31/17   Aline August, MD  Ipratropium-Albuterol (COMBIVENT) 20-100 MCG/ACT AERS respimat Inhale 1 puff into the lungs every 6 (six) hours. 05/31/17   Aline August, MD  loratadine (CLARITIN) 10 MG tablet Take 10 mg by mouth daily.    [provider]  magnesium hydroxide (MILK OF MAGNESIA) 400 MG/5ML suspension Take 30 mLs by mouth daily. 06/11/17   Eugenie Filler, MD  Multiple Vitamin (MULTIVITAMIN WITH MINERALS) TABS tablet Take 1 tablet by mouth daily. 06/23/17   Mikhail, Velta Addison, DO  ondansetron (ZOFRAN-ODT) 4 MG disintegrating tablet Take 4 mg by mouth every 8 (eight) hours as needed for nausea or vomiting.    [provider]  Oxycodone HCl 10  MG TABS Take 10 mg by mouth every 4 (four) hours as needed (pain/shortness of breath).     [provider]  pantoprazole (PROTONIX) 40 MG tablet Take 1 tablet (40 mg total) by mouth 2 (two) times daily. 12/18/16   Eugenie Filler, MD  polyethylene glycol [facility] Floria Raveling) packet Take 17 g by mouth 2 (two) times daily. 06/10/17   Eugenie Filler, MD  senna-docusate (SENNA-PLUS) 8.6-50 MG tablet Take 1 tablet by mouth 2 (two) times daily. 06/10/17   Eugenie Filler, MD    Family History Family History  Problem Relation Age of Onset  . Cancer Father   . Diabetes Mellitus II Mother     Social History Social History   Tobacco Use  . Smoking status: Current Some Day Smoker    Packs/day: 0.10    Years: 30.00    Pack years: 3.00    Types: Cigarettes  . Smokeless tobacco: Never Used  . Tobacco comment: 1-2 cig a day  Substance Use Topics  . Alcohol use: No    Frequency: Never  . Drug use: No     Allergies   Patient has no known allergies.   Review of Systems Review of Systems  All other systems reviewed and are negative.    Physical Exam Updated Vital Signs BP 122/83 (BP Location: Right Arm)   Pulse 97   Temp 97.9 F (36.6 C) (Oral)   Resp 16   Ht 5\' 11"  (1.803 m)   Wt 49.9 kg (110 lb)   SpO2 95%   BMI 15.34 kg/m   Physical Exam  Nursing note and vitals reviewed.  Cachectic 57 year old male, resting comfortably and in no acute distress. Vital signs are reported to be normal, but he is tachypneic on my exam.  He appears dyspneic at rest. Oxygen saturation is 95%, which is normal. Head is normocephalic and atraumatic. PERRLA, EOMI. Oropharynx is clear. Neck is nontender and supple without adenopath. JVD is present at 90 degrees. Back is nontender and there is no CVA tenderness. Lungs have diminished breath sounds throughout.  There are bibasilar rales, and a slightly prolonged exhalation phase without overt  wheezes, rhonchi. Chest is nontender.   Mediport is present on the right. Heart has regular rate and rhythm without murmur. Abdomen is soft, flat, nontender without masses or hepatosplenomegaly and peristalsis is normoactive. Extremities have no cyanosis or edema, full range of motion is present. Skin is warm and dry without rash. Neurologic: Mental status is normal, cranial nerves are intact, there are no motor or sensory deficits.  ED Treatments / Results  Labs (all labs ordered are listed, but only abnormal results are displayed) Labs Reviewed  BASIC METABOLIC PANEL - Abnormal; Notable for the following components:      Result Value   Chloride 97 (*)    Glucose, Bld 101 (*)    Creatinine, Ser 1.72 (*)    GFR calc non Af Amer 42 (*)    GFR calc Af Amer 49 (*)    All other components within normal limits  CBC WITH DIFFERENTIAL/PLATELET - Abnormal; Notable for the following components:   RBC 2.96 (*)    Hemoglobin 9.7 (*)    HCT 29.3 (*)    Lymphs Abs 0.6 (*)    All other components within normal limits  BRAIN NATRIURETIC PEPTIDE   Radiology Dg Chest 2 View  Result Date: 06/24/2017 CLINICAL DATA:  Shortness of breath. History of COPD and lung cancer. EXAM: CHEST - 2 VIEW COMPARISON:  Chest radiograph June 21, 2017 FINDINGS: Cardiomediastinal silhouette is normal. Similar small right greater left pleural effusions. Mild asymmetrically prominent right interstitium is unchanged. Spiculated left upper lobe nodule. Right single-lumen chest Port-A-Cath distal tip projecting cavoatrial junction. No pneumothorax. Soft tissue planes and included osseous structures are not suspicious. IMPRESSION: 1. Stable small right greater left pleural effusion. Asymmetric right interstitial prominence, possibly secondary to pulmonary edema. 2. Redemonstration of spiculated left upper lobe nodule. Electronically Signed   By: Elon Alas M.D.   On: 06/24/2017 04:35    Procedures Procedures   Medications Ordered in ED Medications    ipratropium-albuterol (DUONEB) 0.5-2.5 (3) MG/3ML nebulizer solution 3 mL (has no administration in time range)  furosemide (LASIX) injection 40 mg (has no administration in time range)  methylPREDNISolone sodium succinate (SOLU-MEDROL) 125 mg/2 mL injection 125 mg (has no administration in time range)  ipratropium-albuterol (DUONEB) 0.5-2.5 (3) MG/3ML nebulizer solution 3 mL (3 mLs Nebulization Given 06/24/17 0438)  oxyCODONE-acetaminophen (  PERCOCET/ROXICET) 5-325 MG per tablet 1 tablet (1 tablet Oral Given 06/24/17 0809)  ondansetron (ZOFRAN-ODT) disintegrating tablet 4 mg (4 mg Oral Given 06/24/17 0813)     Initial Impression / Assessment and Plan / ED Course  I have reviewed the triage vital signs and the nursing notes.  Pertinent labs & imaging results that were available during my care of the patient were reviewed by me and considered in my medical decision making (see chart for details).  Dyspnea which is probably related to underlying COPD and lung cancer.  I am also concerned with bibasilar rales and JVD that there may be component of heart failure.  Will check BNP.  Chest x-ray appears to show a slight increase in the size of the cardiac silhouette, right-sided pleural effusion also noted which is unchanged.  Old records are reviewed showing recent discharge from hospital following admission for dyspnea, discharged yesterday.  Admitted May 16 for healthcare associated pneumonia.  Radiologist is concerned about possible mild pulmonary edema.  Labs show elevated creatinine which is not changed from recent values.  Anemia is present and unchanged from baseline.  BNP is normal, but I am still suspicious some of patient's symptoms are from CHF.  He is not feeling any better after albuterol with ipratropium nebulizer.  He is given a dose of furosemide and a second nebulizer treatment.  Also given methylprednisolone intravenously.  Case is signed out to Dr. Sherry Ruffing.  Of note, oxygen saturation is  currently 90% on 3 L nasal oxygen.  If this is not improved, he may need to be admitted.  Final Clinical Impressions(s) / ED Diagnoses   Final diagnoses:  COPD exacerbation ([place])  Renal insufficiency  Normochromic normocytic anemia  Combined systolic and diastolic congestive heart failure, unspecified HF chronicity [facility])    ED Discharge Orders    None       Delora Fuel, MD 35/32/99 [phone]

## 2017-06-24 NOTE — Care Management Note (Signed)
Case Management Note  Patient Details  Name: HJALMER IOVINO MRN: [id number] Date of Birth: Aug 20, 1960  CM consulted for [facility] PT at [place].  CM contacted [name] who reports they contract with [facility].  Contacted Tiffany with [facility] to advise [facility] orders will be placed on pt.  Pt also requested a rolling walker through the admission RN.  Updated Dr. Sherry Ruffing and for [facility] orders status.  Contacted Karen with [facility] for the rolling walker to be delivered to pt at home address of [name].  Pt will need to be transported via [facility] back to the facility.  Gwenevere Abbot, RN.  No further CM needs noted at this time.  Expected Discharge Date:  06/24/2017              Expected Discharge Plan:  [place]  Discharge planning Services  CM Consult  Post Acute Care Choice:  Home Health, Durable Medical Equipment Choice offered to:  NA  DME Arranged:  Walker rolling DME Agency:  [place]:  PT [facility] Agency:  [facility]  Status of Service:  Completed, signed off  Rae Mar, RN 06/24/2017, 2:10 PM

## 2017-06-24 NOTE — ED Provider Notes (Signed)
8:22 AM Care assumed from Dr. Roxanne Mins.  At time of transfer care, patient is awaiting reassessment after second DuoNeb and steroids.  Suspected COPD exacerbation causing shortness of breath and hypoxia.  By report, patient normally has intermittent nausea at home but is currently on 3 L with a sat of 90%.  Patient was also given Lasix for possible CHF involvement.  Next  Anticipate reassessment after treatments to [place] if patient needs admission.   9:22 AM Patient was reassessed and reported he was still short of breath.  His wheezing improved on my initial exam however patient will be ablated with pulse ox to determine if he is still getting hypoxic.    9:39 AM Nursing reports that when they walked him, he did drop into the 80s.  It took him several minutes to get back into the 90s after he stopped ambulate them.  She reports that he was very fatigued and winded.  Patient reports he does not feel safe going home a short of breath as he is.    Hospitalist will be called for admission for COPD exacerbation in the setting of his lung cancer  and CHF.  11:22 AM Hospitalist team requested patient try increasing his O2 sats and reassess.  Patient was allowed to ambulate on 4 L, increase from his prior.  Patient had improvement in his oxygen saturations in relation but continued to be very short of breath and had to catch his breath after walking.  Patient reports he is concerned about discharge.  Hospitalist team will be called again.  12:07 PM Hospitalist assess patient feel he is safe for discharge home.  They do request that social work be contacting her to try and arrange rehab for the patient at his facility.  Social work consult was placed.  PT discharged in good condition with understanding of return precautions.   Clinical Impression: 1. COPD exacerbation ([place])   2. Renal insufficiency   3. Normochromic normocytic anemia   4. Combined systolic and diastolic congestive heart failure,  unspecified HF chronicity ([place])     Disposition: Discharge  Condition: Good  I have discussed the results, Dx and Tx plan with the pt(& family if present). He/she/they expressed understanding and agree(s) with the plan. Discharge instructions discussed at great length. Strict return precautions discussed and pt &/or family have verbalized understanding of the instructions. No further questions at time of discharge.    Discharge Medication List as of 06/24/2017  2:23 PM      Follow Up: Health, [facility]  [address] [phone]  Follow up   [name], MD [place] [address] [phone]     [facility] DEPT [place] [id number] [place] [address]       Samyah Bilbo, Gwenyth Allegra, MD 06/24/17 1840

## 2017-06-24 NOTE — Consult Note (Signed)
Medicine consult   [name] ZOX:[id number] DOB: [birth year] DOA: 06/24/2017  PCP: [name], MD Patient coming from: ALF  Chief Complaint: Feeling weak  HPI: [name] is a 57 y.o. male with medical history significant of combined systolic and diastolic heart failure, Gastritis, COPD, chronic abdominal pain, CKD stage III, hypertension, Stage 4 lung cancer.  Patient presented to the emergency department secondary to feeling weak.  Patient was recently discharged for admission of dyspnea.  Patient reports currently not having any dyspnea.  He is currently feeling weak even when he was ambulating the hallway.  He is currently at an ALF but does not get physical therapy on a daily basis.  ED Course: Vitals: Afebrile, normal pulse, normal respirations, normotensive, on 2 L via nasal cannula in the 90% SPO2 range Labs: Creatinine 1.72, hemoglobin 9.7, BNP of 54.5 Imaging: Chest x-ray significant for stable bilateral pleural effusinos  Review of Systems: Review of Systems  Respiratory: Positive for shortness of breath.   Gastrointestinal: Positive for abdominal pain and constipation.  All other systems reviewed and are negative.   Past Medical History:  Diagnosis Date  . Abdominal pain 06/04/2016  . Adenocarcinoma of left lung, stage 4 ([place]) 05/02/2016  . Alcohol abuse   . Bronchitis due to tobacco use ([place])   . Cancer ([place])    Lung  . COPD (chronic obstructive pulmonary disease) ([place])   . Dehydration 06/04/2016  . Encounter for antineoplastic chemotherapy 05/02/2016  . Gastritis   . Goals of care, counseling/discussion 05/02/2016  . Hematemesis   . HTN (hypertension) 10/30/2016  . Recurrent upper respiratory infection (URI)   . Seizures ([place]) 05/2011   new onset  . Shortness of breath     Past Surgical History:  Procedure Laterality Date  . ESOPHAGOGASTRODUODENOSCOPY (EGD) WITH PROPOFOL N/A 12/17/2016   Procedure: ESOPHAGOGASTRODUODENOSCOPY (EGD) WITH PROPOFOL;   Surgeon: Ladene Artist, MD;  Location: [facility];  Service: Endoscopy;  Laterality: N/A;  . IR FLUORO GUIDE PORT INSERTION RIGHT  05/13/2016  . IR US GUIDE VASC ACCESS RIGHT  05/13/2016  . NO PAST SURGERIES       reports that he has been smoking cigarettes.  He has a 3.00 pack-year smoking history. He has never used smokeless tobacco. He reports that he does not drink alcohol or use drugs.  No Known Allergies  Family History  Problem Relation Age of Onset  . Cancer Father   . Diabetes Mellitus II Mother     Prior to Admission medications   Medication Sig Start Date End Date Taking? Authorizing Provider  acetaminophen (TYLENOL) 500 MG tablet Take 1,000 mg by mouth 2 (two) times daily as needed for mild pain.   Yes [provider]  albuterol (PROVENTIL) (2.5 MG/3ML) 0.083% nebulizer solution 1 neb every 4-6 hours as needed for wheezing and shortness of breath Patient taking differently: Take 2.5 mg by nebulization every 4 (four) hours as needed for wheezing or shortness of breath.  04/01/17  Yes Parrett, Tammy S, NP  amLODipine (NORVASC) 10 MG tablet Take 10 mg by mouth daily.   Yes [provider]  amoxicillin-clavulanate (AUGMENTIN) 875-125 MG tablet Take 1 tablet by mouth 2 (two) times daily.   Yes [provider]  Chlorphen-Phenyleph-ASA (ALKA-SELTZER PLUS COLD PO) Take 2 tablets by mouth at bedtime as needed (COUGH).   Yes [provider]  dexamethasone (DECADRON) 4 MG tablet Take 4 mg by mouth 2 (two) times daily. The day before, the day  of, and the day after chemotherapy every three weeks   Yes [provider]  escitalopram (LEXAPRO) 5 MG tablet Take 1 tablet (5 mg total) by mouth at bedtime. 04/17/17  Yes Rosita Fire, MD  feeding supplement, ENSURE ENLIVE, (ENSURE ENLIVE) LIQD Take 237 mLs by mouth 3 (three) times daily between meals. 03/19/17  Yes Shelly Coss, MD  fluconazole (DIFLUCAN) 100 MG tablet Take 1 tablet (100 mg  total) by mouth daily. Patient taking differently: Take 100 mg by mouth daily. Continuous 03/20/17  Yes Adhikari, Tamsen Meek, MD  Fluticasone-Umeclidin-Vilant (TRELEGY ELLIPTA) 100-62.5-25 MCG/INH AEPB Inhale 1 puff into the lungs daily. 05/31/17  Yes Aline August, MD  furosemide (LASIX) 20 MG tablet Take 1 tablet (20 mg total) by mouth daily. 06/10/17  Yes Eugenie Filler, MD  guaiFENesin (MUCINEX) 600 MG 12 hr tablet Take 600 mg by mouth 2 (two) times daily.   Yes [provider]  guaiFENesin-dextromethorphan (ROBITUSSIN DM) 100-10 MG/5ML syrup Take 5 mLs by mouth every 4 (four) hours as needed for cough. 05/31/17  Yes Aline August, MD  Ipratropium-Albuterol (COMBIVENT) 20-100 MCG/ACT AERS respimat Inhale 1 puff into the lungs every 6 (six) hours. 05/31/17  Yes Aline August, MD  loratadine (CLARITIN) 10 MG tablet Take 10 mg by mouth daily.   Yes [provider]  magnesium hydroxide (MILK OF MAGNESIA) 400 MG/5ML suspension Take 30 mLs by mouth daily. 06/11/17  Yes Eugenie Filler, MD  metoprolol tartrate (LOPRESSOR) 25 MG tablet Take 25 mg by mouth 2 (two) times daily.   Yes [provider]  Multiple Vitamin (MULTIVITAMIN WITH MINERALS) TABS tablet Take 1 tablet by mouth daily. 06/23/17  Yes Mikhail, Maryann, DO  ondansetron (ZOFRAN-ODT) 4 MG disintegrating tablet Take 4 mg by mouth every 8 (eight) hours as needed for nausea or vomiting.   Yes [provider]  Oxycodone HCl 10 MG TABS Take 10 mg by mouth every 4 (four) hours as needed (pain/shortness of breath).    Yes [provider]  pantoprazole (PROTONIX) 40 MG tablet Take 1 tablet (40 mg total) by mouth 2 (two) times daily. 12/18/16  Yes Eugenie Filler, MD  polyethylene glycol [facility] / [facility] Raveling) packet Take 17 g by mouth 2 (two) times daily. 06/10/17  Yes Eugenie Filler, MD  senna-docusate (SENNA-PLUS) 8.6-50 MG tablet Take 1 tablet by mouth 2 (two) times daily. 06/10/17  Yes Eugenie Filler, MD    Physical Exam:  Physical Exam  Constitutional: He is oriented to person, place, and time. He appears well-developed. No distress.  HENT:  Mouth/Throat: Oropharynx is clear and moist.  Eyes: Pupils are equal, round, and reactive to light. Conjunctivae and EOM are normal.  Neck: Normal range of motion.  Cardiovascular: Normal rate, regular rhythm and normal heart sounds.  No murmur heard. Pulmonary/Chest: Effort normal. No respiratory distress. He has no wheezes. He has rales.  Abdominal: Soft. Bowel sounds are normal. He exhibits no distension. There is no tenderness. There is no rebound and no guarding.  Musculoskeletal: Normal range of motion. He exhibits no edema or tenderness.  Lymphadenopathy:    He has no cervical adenopathy.  Neurological: He is alert and oriented to person, place, and time.  Skin: Skin is warm and dry. He is not diaphoretic.     Labs on Admission: I have personally reviewed following labs and imaging studies  CBC: Recent Labs  Lab 06/21/17 0539 06/22/17 0445 06/22/17 2000 06/23/17 0328 06/24/17 0428  WBC 3.2* 2.9*  --  3.3* 4.8  NEUTROABS 2.0  --   --   --  3.6  HGB 7.9* 7.3* 9.4* 8.7* 9.7*  HCT 23.7* 22.4* 28.9* 26.9* 29.3*  MCV 99.6 101.4*  --  99.6 99.0  PLT 102* 112*  --  143* 784    Basic Metabolic Panel: Recent Labs  Lab 06/22/17 0445 06/23/17 0328 06/24/17 0428  NA 137 140 136  K 4.0 4.1 4.1  CL 101 99* 97*  CO2 27 30 27   GLUCOSE 99 82 101*  BUN 17 15 16   CREATININE 1.83* 1.72* 1.72*  CALCIUM 8.7* 9.1 9.3    GFR: Estimated Creatinine Clearance: 33.4 mL/min (A) (by C-G formula based on SCr of 1.72 mg/dL (H)).   Cardiac Enzymes: Recent Labs  Lab 06/21/17 0539  TROPONINI <0.03   Urine analysis:    Component Value Date/Time   COLORURINE YELLOW 05/20/2017 0940   APPEARANCEUR CLEAR 05/20/2017 0940   LABSPEC 1.015 05/20/2017 0940   PHURINE 5.0 05/20/2017 0940   GLUCOSEU NEGATIVE 05/20/2017 0940   HGBUR  NEGATIVE 05/20/2017 0940   BILIRUBINUR NEGATIVE 05/20/2017 0940   KETONESUR 5 (A) 05/20/2017 0940   PROTEINUR 30 (A) 05/20/2017 0940   UROBILINOGEN 0.2 05/29/2011 0122   NITRITE NEGATIVE 05/20/2017 0940   LEUKOCYTESUR NEGATIVE 05/20/2017 0940     Radiological Exams on Admission: Dg Chest 2 View  Result Date: 06/24/2017 CLINICAL DATA:  Shortness of breath. History of COPD and lung cancer. EXAM: CHEST - 2 VIEW COMPARISON:  Chest radiograph June 21, 2017 FINDINGS: Cardiomediastinal silhouette is normal. Similar small right greater left pleural effusions. Mild asymmetrically prominent right interstitium is unchanged. Spiculated left upper lobe nodule. Right single-lumen chest Port-A-Cath distal tip projecting cavoatrial junction. No pneumothorax. Soft tissue planes and included osseous structures are not suspicious. IMPRESSION: 1. Stable small right greater left pleural effusion. Asymmetric right interstitial prominence, possibly secondary to pulmonary edema. 2. Redemonstration of spiculated left upper lobe nodule. Electronically Signed   By: Elon Alas M.D.   On: 06/24/2017 04:35    Assessment/Plan  Weakness Patiently recently admitted and discharged for dyspnea.  Patient currently states that he has weakness and has increased exertional dyspnea.  Patient has an underlying history of COPD and still smokes in addition to lung cancer.  Recommend continued outpatient management.  Recommend physical therapy at ALF.  If continues to be an issue, would recommend SNF placement.  Plan discussed with the ED physician.  Patient agrees with plan.   Cordelia Poche, MD [facility]  If 7PM-7AM, please contact night-coverage [email or web address] Password [email or web address]  06/24/2017, 12:18 PM

## 2017-06-25 ENCOUNTER — Other Ambulatory Visit: Payer: Self-pay

## 2017-06-25 ENCOUNTER — Emergency Department (HOSPITAL_COMMUNITY): Payer: Medicaid Other

## 2017-06-25 ENCOUNTER — Encounter (HOSPITAL_COMMUNITY): Payer: Self-pay | Admitting: Emergency Medicine

## 2017-06-25 ENCOUNTER — Inpatient Hospital Stay (HOSPITAL_COMMUNITY)
Admission: EM | Admit: 2017-06-25 | Discharge: 2017-06-29 | DRG: 190 | Disposition: A | Discharge status: Nursing Home (Includes ICF) | Payer: Medicaid Other | Attending: Internal Medicine | Admitting: Internal Medicine

## 2017-06-25 DIAGNOSIS — D630 Anemia in neoplastic disease: Secondary | ICD-10-CM | POA: Diagnosis present

## 2017-06-25 DIAGNOSIS — I5032 Chronic diastolic (congestive) heart failure: Secondary | ICD-10-CM | POA: Diagnosis present

## 2017-06-25 DIAGNOSIS — Z7951 Long term (current) use of inhaled steroids: Secondary | ICD-10-CM

## 2017-06-25 DIAGNOSIS — K5900 Constipation, unspecified: Secondary | ICD-10-CM | POA: Diagnosis present

## 2017-06-25 DIAGNOSIS — I130 Hypertensive heart and chronic kidney disease with heart failure and stage 1 through stage 4 chronic kidney disease, or unspecified chronic kidney disease: Secondary | ICD-10-CM | POA: Diagnosis present

## 2017-06-25 DIAGNOSIS — J449 Chronic obstructive pulmonary disease, unspecified: Secondary | ICD-10-CM | POA: Diagnosis present

## 2017-06-25 DIAGNOSIS — E43 Unspecified severe protein-calorie malnutrition: Secondary | ICD-10-CM | POA: Diagnosis present

## 2017-06-25 DIAGNOSIS — N189 Chronic kidney disease, unspecified: Secondary | ICD-10-CM

## 2017-06-25 DIAGNOSIS — R0602 Shortness of breath: Secondary | ICD-10-CM | POA: Diagnosis present

## 2017-06-25 DIAGNOSIS — C3492 Malignant neoplasm of unspecified part of left bronchus or lung: Secondary | ICD-10-CM | POA: Diagnosis present

## 2017-06-25 DIAGNOSIS — Z681 Body mass index (BMI) 19 or less, adult: Secondary | ICD-10-CM

## 2017-06-25 DIAGNOSIS — Z7952 Long term (current) use of systemic steroids: Secondary | ICD-10-CM

## 2017-06-25 DIAGNOSIS — Z809 Family history of malignant neoplasm, unspecified: Secondary | ICD-10-CM

## 2017-06-25 DIAGNOSIS — D539 Nutritional anemia, unspecified: Secondary | ICD-10-CM | POA: Diagnosis present

## 2017-06-25 DIAGNOSIS — D631 Anemia in chronic kidney disease: Secondary | ICD-10-CM | POA: Diagnosis present

## 2017-06-25 DIAGNOSIS — N179 Acute kidney failure, unspecified: Secondary | ICD-10-CM

## 2017-06-25 DIAGNOSIS — N183 Chronic kidney disease, stage 3 unspecified: Secondary | ICD-10-CM | POA: Diagnosis present

## 2017-06-25 DIAGNOSIS — Z79899 Other long term (current) drug therapy: Secondary | ICD-10-CM

## 2017-06-25 DIAGNOSIS — F17210 Nicotine dependence, cigarettes, uncomplicated: Secondary | ICD-10-CM | POA: Diagnosis present

## 2017-06-25 DIAGNOSIS — Z9981 Dependence on supplemental oxygen: Secondary | ICD-10-CM

## 2017-06-25 DIAGNOSIS — I10 Essential (primary) hypertension: Secondary | ICD-10-CM | POA: Diagnosis present

## 2017-06-25 DIAGNOSIS — J441 Chronic obstructive pulmonary disease with (acute) exacerbation: Principal | ICD-10-CM

## 2017-06-25 DIAGNOSIS — C7951 Secondary malignant neoplasm of bone: Secondary | ICD-10-CM | POA: Diagnosis present

## 2017-06-25 DIAGNOSIS — E860 Dehydration: Secondary | ICD-10-CM | POA: Diagnosis present

## 2017-06-25 DIAGNOSIS — Z720 Tobacco use: Secondary | ICD-10-CM | POA: Diagnosis present

## 2017-06-25 LAB — BRAIN NATRIURETIC PEPTIDE: B Natriuretic Peptide: 89.9 pg/mL (ref 0.0–100.0)

## 2017-06-25 LAB — COMPREHENSIVE METABOLIC PANEL
ALBUMIN: 3.3 g/dL — AB (ref 3.5–5.0)
ALT: 47 U/L (ref 17–63)
ANION GAP: 13 (ref 5–15)
AST: 44 U/L — ABNORMAL HIGH (ref 15–41)
Alkaline Phosphatase: 115 U/L (ref 38–126)
BILIRUBIN TOTAL: 0.3 mg/dL (ref 0.3–1.2)
BUN: 28 mg/dL — ABNORMAL HIGH (ref 6–20)
CO2: 32 mmol/L (ref 22–32)
Calcium: 9.3 mg/dL (ref 8.9–10.3)
Chloride: 94 mmol/L — ABNORMAL LOW (ref 101–111)
Creatinine, Ser: 2.14 mg/dL — ABNORMAL HIGH (ref 0.61–1.24)
GFR calc Af Amer: 38 mL/min — ABNORMAL LOW (ref >60)
GFR calc non Af Amer: 33 mL/min — ABNORMAL LOW (ref >60)
Glucose, Bld: 139 mg/dL — ABNORMAL HIGH (ref 65–99)
POTASSIUM: 4.6 mmol/L (ref 3.5–5.1)
SODIUM: 139 mmol/L (ref 135–145)
TOTAL PROTEIN: 6.8 g/dL (ref 6.5–8.1)

## 2017-06-25 LAB — CBC WITH DIFFERENTIAL/PLATELET
BASOS ABS: 0 10*3/uL (ref 0.0–0.1)
Basophils Relative: 0 %
Eosinophils Absolute: 0 10*3/uL (ref 0.0–0.7)
Eosinophils Relative: 0 %
HEMATOCRIT: 26.9 % — AB (ref 39.0–52.0)
HEMOGLOBIN: 8.8 g/dL — AB (ref 13.0–17.0)
LYMPHS PCT: 8 %
Lymphs Abs: 0.6 10*3/uL — ABNORMAL LOW (ref 0.7–4.0)
MCH: 32.1 pg (ref 26.0–34.0)
MCHC: 32.7 g/dL (ref 30.0–36.0)
MCV: 98.2 fL (ref 78.0–100.0)
Monocytes Absolute: 0.8 10*3/uL (ref 0.1–1.0)
Monocytes Relative: 11 %
NEUTROS ABS: 5.9 10*3/uL (ref 1.7–7.7)
NEUTROS PCT: 81 %
Platelets: 229 10*3/uL (ref 150–400)
RBC: 2.74 MIL/uL — ABNORMAL LOW (ref 4.22–5.81)
RDW: 14.8 % (ref 11.5–15.5)
WBC: 7.2 10*3/uL (ref 4.0–10.5)

## 2017-06-25 LAB — POC OCCULT BLOOD, ED: FECAL OCCULT BLD: POSITIVE — AB

## 2017-06-25 LAB — I-STAT TROPONIN, ED: Troponin i, poc: 0.02 ng/mL (ref 0.00–0.08)

## 2017-06-25 MED ORDER — GUAIFENESIN ER 600 MG PO TB12
600.0000 mg | ORAL_TABLET | Freq: Two times a day (BID) | ORAL | Status: DC
  Administered 2017-06-25 – 2017-06-27 (×6): 600 mg via ORAL
  Filled 2017-06-25 (×10): qty 1

## 2017-06-25 MED ORDER — SODIUM CHLORIDE 0.9 % IV SOLN
INTRAVENOUS | Status: DC
  Administered 2017-06-25 (×2): via INTRAVENOUS

## 2017-06-25 MED ORDER — PANTOPRAZOLE SODIUM 40 MG PO TBEC
40.0000 mg | DELAYED_RELEASE_TABLET | Freq: Two times a day (BID) | ORAL | Status: DC
  Administered 2017-06-25 – 2017-06-29 (×9): 40 mg via ORAL
  Filled 2017-06-25 (×9): qty 1

## 2017-06-25 MED ORDER — POLYETHYLENE GLYCOL 3350 17 G PO PACK
17.0000 g | PACK | Freq: Two times a day (BID) | ORAL | Status: DC
  Administered 2017-06-25 – 2017-06-27 (×4): 17 g via ORAL
  Filled 2017-06-25 (×8): qty 1

## 2017-06-25 MED ORDER — METHYLPREDNISOLONE SODIUM SUCC 40 MG IJ SOLR
40.0000 mg | Freq: Two times a day (BID) | INTRAMUSCULAR | Status: DC
Start: 2017-06-25 — End: 2017-06-26
  Administered 2017-06-25 (×2): 40 mg via INTRAVENOUS
  Filled 2017-06-25 (×2): qty 1

## 2017-06-25 MED ORDER — HEPARIN SODIUM (PORCINE) 5000 UNIT/ML IJ SOLN
5000.0000 [IU] | Freq: Three times a day (TID) | INTRAMUSCULAR | Status: DC
  Filled 2017-06-25 (×4): qty 1

## 2017-06-25 MED ORDER — IPRATROPIUM-ALBUTEROL 0.5-2.5 (3) MG/3ML IN SOLN
3.0000 mL | Freq: Once | RESPIRATORY_TRACT | Status: AC
  Administered 2017-06-25: 3 mL via RESPIRATORY_TRACT
  Filled 2017-06-25: qty 3

## 2017-06-25 MED ORDER — ESCITALOPRAM OXALATE 10 MG PO TABS
5.0000 mg | ORAL_TABLET | Freq: Every day | ORAL | Status: DC
  Administered 2017-06-25 – 2017-06-28 (×4): 5 mg via ORAL
  Filled 2017-06-25 (×4): qty 1

## 2017-06-25 MED ORDER — OXYCODONE HCL 5 MG PO TABS
10.0000 mg | ORAL_TABLET | ORAL | Status: DC | PRN
  Administered 2017-06-25 – 2017-06-29 (×13): 10 mg via ORAL
  Filled 2017-06-25 (×13): qty 2

## 2017-06-25 MED ORDER — SENNOSIDES-DOCUSATE SODIUM 8.6-50 MG PO TABS
1.0000 | ORAL_TABLET | Freq: Two times a day (BID) | ORAL | Status: DC
  Administered 2017-06-25 – 2017-06-28 (×6): 1 via ORAL
  Filled 2017-06-25 (×8): qty 1

## 2017-06-25 MED ORDER — METOPROLOL TARTRATE 25 MG PO TABS
12.5000 mg | ORAL_TABLET | Freq: Two times a day (BID) | ORAL | Status: DC
  Administered 2017-06-25 – 2017-06-29 (×9): 12.5 mg via ORAL
  Filled 2017-06-25 (×9): qty 1

## 2017-06-25 MED ORDER — IPRATROPIUM-ALBUTEROL 0.5-2.5 (3) MG/3ML IN SOLN
3.0000 mL | Freq: Four times a day (QID) | RESPIRATORY_TRACT | Status: DC
  Administered 2017-06-25 – 2017-06-26 (×3): 3 mL via RESPIRATORY_TRACT
  Filled 2017-06-25 (×3): qty 3

## 2017-06-25 MED ORDER — METHYLPREDNISOLONE SODIUM SUCC 125 MG IJ SOLR
125.0000 mg | Freq: Once | INTRAMUSCULAR | Status: AC
  Administered 2017-06-25: 125 mg via INTRAVENOUS
  Filled 2017-06-25: qty 2

## 2017-06-25 MED ORDER — ALBUTEROL SULFATE (2.5 MG/3ML) 0.083% IN NEBU
2.5000 mg | INHALATION_SOLUTION | RESPIRATORY_TRACT | Status: DC | PRN
  Administered 2017-06-26: 2.5 mg via RESPIRATORY_TRACT
  Filled 2017-06-25: qty 3

## 2017-06-25 NOTE — H&P (Addendum)
History and Physical    [name] QIO:[id number] DOB: 1960/12/21 DOA: 06/25/2017  PCP: [name], MD   Patient coming from: Home    Chief Complaint:   HPI: [name] is a 57 y.o. male with medical history significant of stage IV adenocarcinoma of the lung, COPD, hypertension, CHF who presented to the emergency department for evaluation of shortness of breath.  Patient reported that he has been having progressive shortness of breath since last 2 days.  He has been admitted recently multiple times for the same complaint.  Patient has history of COPD and is on home oxygen at 2 L/min.  He continues to smoke.  Patient reports of productive cough. Patient seen and examined the bedside in the emergency department.  He was not in acute respiratory distress during my evaluation.  Dyspnea has been his chronic problem.  He was evaluated by pulmonology on one of the last admissions and was recommended opiates for chronic dyspnea.  He has not followed with pulmonology as an outpatient yet. Patient follows with oncology [name] for lung cancer and is on chemotherapy.  He recently had a transfusion for the anemia. Patient denies any fever but admits of having chills, denies chest pain, palpitations, headache, dysuria, diarrhea.  ED Course:Chest x-ray done in the emergency department did not show any pneumonia  but just showed a small bilateral pleural effusions.  Review of Systems: As per HPI otherwise 10 point review of systems negative.    Past Medical History:  Diagnosis Date  . Abdominal pain 06/04/2016  . Adenocarcinoma of left lung, stage 4 ([place]) 05/02/2016  . Alcohol abuse   . Bronchitis due to tobacco use ([place])   . Cancer ([place])    Lung  . COPD (chronic obstructive pulmonary disease) ([place])   . Dehydration 06/04/2016  . Encounter for antineoplastic chemotherapy 05/02/2016  . Gastritis   . Goals of care, counseling/discussion 05/02/2016  . Hematemesis   . HTN  (hypertension) 10/30/2016  . Recurrent upper respiratory infection (URI)   . Seizures ([place]) 05/2011   new onset  . Shortness of breath     Past Surgical History:  Procedure Laterality Date  . ESOPHAGOGASTRODUODENOSCOPY (EGD) WITH PROPOFOL N/A 12/17/2016   Procedure: ESOPHAGOGASTRODUODENOSCOPY (EGD) WITH PROPOFOL;  Surgeon: Ladene Artist, MD;  Location: [facility];  Service: Endoscopy;  Laterality: N/A;  . IR FLUORO GUIDE PORT INSERTION RIGHT  05/13/2016  . IR US GUIDE VASC ACCESS RIGHT  05/13/2016  . NO PAST SURGERIES       reports that he has been smoking cigarettes.  He has a 3.00 pack-year smoking history. He has never used smokeless tobacco. He reports that he does not drink alcohol or use drugs.  No Known Allergies  Family History  Problem Relation Age of Onset  . Cancer Father   . Diabetes Mellitus II Mother      Prior to Admission medications   Medication Sig Start Date End Date Taking? Authorizing Provider  acetaminophen (TYLENOL) 500 MG tablet Take 1,000 mg by mouth 2 (two) times daily as needed for mild pain.   Yes [provider]  albuterol (PROVENTIL) (2.5 MG/3ML) 0.083% nebulizer solution 1 neb every 4-6 hours as needed for wheezing and shortness of breath Patient taking differently: Take 2.5 mg by nebulization every 4 (four) hours as needed for wheezing or shortness of breath.  04/01/17  Yes Parrett, Tammy S, NP  Chlorphen-Phenyleph-ASA (ALKA-SELTZER PLUS COLD PO) Take 2 tablets by mouth at bedtime  as needed (COUGH).   Yes [provider]  dexamethasone (DECADRON) 4 MG tablet Take 4 mg by mouth 2 (two) times daily. The day before, the day of, and the day after chemotherapy every three weeks   Yes [provider]  escitalopram (LEXAPRO) 5 MG tablet Take 1 tablet (5 mg total) by mouth at bedtime. 04/17/17  Yes Rosita Fire, MD  feeding supplement, ENSURE ENLIVE, (ENSURE ENLIVE) LIQD Take 237 mLs by mouth 3 (three) times daily between  meals. 03/19/17  Yes Shelly Coss, MD  fluconazole (DIFLUCAN) 100 MG tablet Take 1 tablet (100 mg total) by mouth daily. Patient taking differently: Take 100 mg by mouth daily. Continuous 03/20/17  Yes Rachella Basden, Tamsen Meek, MD  Fluticasone-Umeclidin-Vilant (TRELEGY ELLIPTA) 100-62.5-25 MCG/INH AEPB Inhale 1 puff into the lungs daily. 05/31/17  Yes Aline August, MD  furosemide (LASIX) 20 MG tablet Take 1 tablet (20 mg total) by mouth daily. 06/10/17  Yes Eugenie Filler, MD  guaiFENesin (MUCINEX) 600 MG 12 hr tablet Take 1,200 mg by mouth 2 (two) times daily.    Yes [provider]  guaiFENesin-dextromethorphan (ROBITUSSIN DM) 100-10 MG/5ML syrup Take 5 mLs by mouth every 4 (four) hours as needed for cough. 05/31/17  Yes Aline August, MD  Ipratropium-Albuterol (COMBIVENT) 20-100 MCG/ACT AERS respimat Inhale 1 puff into the lungs every 6 (six) hours. 05/31/17  Yes Aline August, MD  loratadine (CLARITIN) 10 MG tablet Take 10 mg by mouth daily.   Yes [provider]  magnesium hydroxide (MILK OF MAGNESIA) 400 MG/5ML suspension Take 30 mLs by mouth daily. 06/11/17  Yes Eugenie Filler, MD  metoprolol tartrate (LOPRESSOR) 25 MG tablet Take 12.5 mg by mouth 2 (two) times daily.    Yes [provider]  Multiple Vitamin (MULTIVITAMIN WITH MINERALS) TABS tablet Take 1 tablet by mouth daily. 06/23/17  Yes Mikhail, Maryann, DO  ondansetron (ZOFRAN-ODT) 4 MG disintegrating tablet Take 4 mg by mouth every 8 (eight) hours as needed for nausea or vomiting.   Yes [provider]  Oxycodone HCl 10 MG TABS Take 10 mg by mouth every 4 (four) hours as needed (pain/shortness of breath).    Yes [provider]  pantoprazole (PROTONIX) 40 MG tablet Take 1 tablet (40 mg total) by mouth 2 (two) times daily. 12/18/16  Yes Eugenie Filler, MD  polyethylene glycol [facility] / [facility]) packet Take 17 g by mouth 2 (two) times daily. 06/10/17  Yes Eugenie Filler, MD    senna-docusate (SENNA-PLUS) 8.6-50 MG tablet Take 1 tablet by mouth 2 (two) times daily. 06/10/17  Yes Eugenie Filler, MD    Physical Exam: Vitals:   06/25/17 0910 06/25/17 0930 06/25/17 1000 06/25/17 1100  BP: 120/85 123/81 126/80 118/79  Pulse: 90 89 79 76  Resp: 17 16 (!) 26   SpO2: 100% 100% 98% 100%  Weight:      Height:        Constitutional: NAD, calm, comfortable Vitals:   06/25/17 0910 06/25/17 0930 06/25/17 1000 06/25/17 1100  BP: 120/85 123/81 126/80 118/79  Pulse: 90 89 79 76  Resp: 17 16 (!) 26   SpO2: 100% 100% 98% 100%  Weight:      Height:       General: Thin, not in acute distress  eyes: PERRL, lids and conjunctivae normal ENMT: Mucous membranes are moist. Posterior pharynx clear of any exudate or lesions.Normal dentition.  Neck: normal, supple, no masses, no thyromegaly Respiratory: Bilateral decreased air entry, mild  wheezing bilaterally, no crackles, normal respiratory effort. No accessory muscle use.  Cardiovascular: Regular rate and rhythm, no murmurs / rubs / gallops. No extremity edema. 2+ pedal pulses. No carotid bruits.  Abdomen: no tenderness, no masses palpated. No hepatosplenomegaly. Bowel sounds positive.  Musculoskeletal: no clubbing / cyanosis. No joint deformity upper and lower extremities. Good ROM, no contractures. Normal muscle tone.  Skin: no rashes, lesions, ulcers. No induration Neurologic: CN 2-12 grossly intact. Sensation intact, DTR normal. Strength 5/5 in all 4.  Psychiatric: Normal judgment and insight. Alert and oriented x 3. Normal mood.   Foley Catheter:None  Labs on Admission: I have personally reviewed following labs and imaging studies  CBC: Recent Labs  Lab 06/21/17 0539 06/22/17 0445 06/22/17 2000 06/23/17 0328 06/24/17 0428 06/25/17 0750  WBC 3.2* 2.9*  --  3.3* 4.8 7.2  NEUTROABS 2.0  --   --   --  3.6 5.9  HGB 7.9* 7.3* 9.4* 8.7* 9.7* 8.8*  HCT 23.7* 22.4* 28.9* 26.9* 29.3* 26.9*  MCV 99.6 101.4*  --   99.6 99.0 98.2  PLT 102* 112*  --  143* 188 858   Basic Metabolic Panel: Recent Labs  Lab 06/21/17 0539 06/22/17 0445 06/23/17 0328 06/24/17 0428 06/25/17 0750  NA 142 137 140 136 139  K 4.2 4.0 4.1 4.1 4.6  CL 103 101 99* 97* 94*  CO2 33* 27 30 27  32  GLUCOSE 112* 99 82 101* 139*  BUN 26* 17 15 16  28*  CREATININE 1.92* 1.83* 1.72* 1.72* 2.14*  CALCIUM 9.2 8.7* 9.1 9.3 9.3   GFR: Estimated Creatinine Clearance: 26.9 mL/min (A) (by C-G formula based on SCr of 2.14 mg/dL (H)). Liver Function Tests: Recent Labs  Lab 06/25/17 0750  AST 44*  ALT 47  ALKPHOS 115  BILITOT 0.3  PROT 6.8  ALBUMIN 3.3*   No results for input(s): LIPASE, AMYLASE in the last 168 hours. No results for input(s): AMMONIA in the last 168 hours. Coagulation Profile: No results for input(s): INR, PROTIME in the last 168 hours. Cardiac Enzymes: Recent Labs  Lab 06/21/17 0539  TROPONINI <0.03   BNP (last 3 results) No results for input(s): PROBNP in the last 8760 hours. HbA1C: No results for input(s): HGBA1C in the last 72 hours. CBG: No results for input(s): GLUCAP in the last 168 hours. Lipid Profile: No results for input(s): CHOL, HDL, LDLCALC, TRIG, CHOLHDL, LDLDIRECT in the last 72 hours. Thyroid Function Tests: No results for input(s): TSH, T4TOTAL, FREET4, T3FREE, THYROIDAB in the last 72 hours. Anemia Panel: No results for input(s): VITAMINB12, FOLATE, FERRITIN, TIBC, IRON, RETICCTPCT in the last 72 hours. Urine analysis:    Component Value Date/Time   COLORURINE YELLOW 05/20/2017 0940   APPEARANCEUR CLEAR 05/20/2017 0940   LABSPEC 1.015 05/20/2017 0940   PHURINE 5.0 05/20/2017 0940   GLUCOSEU NEGATIVE 05/20/2017 0940   HGBUR NEGATIVE 05/20/2017 0940   BILIRUBINUR NEGATIVE 05/20/2017 0940   KETONESUR 5 (A) 05/20/2017 0940   PROTEINUR 30 (A) 05/20/2017 0940   UROBILINOGEN 0.2 05/29/2011 0122   NITRITE NEGATIVE 05/20/2017 0940   LEUKOCYTESUR NEGATIVE 05/20/2017 0940     Radiological Exams on Admission: Dg Chest 2 View  Result Date: 06/25/2017 CLINICAL DATA:  Shortness of breath.  History of lung carcinoma EXAM: CHEST - 2 VIEW COMPARISON:  June 24, 2017 chest radiograph and chest CT April 13, 2017 FINDINGS: Port-A-Cath tip is in superior vena cava. No pneumothorax. There are small pleural effusions bilaterally with bibasilar scarring. The irregular  nodular opacity in the left upper lobe medially is stable, measuring 2.2 x 2.1 cm. No new opacity is evident. Heart size and pulmonary vascularity are normal. No adenopathy is appreciable. No bone lesions. IMPRESSION: Spiculated nodular lesion left upper lobe medially. Neoplastic focus must be of concern given this appearance. Small pleural effusions with mild bibasilar scarring noted. Stable cardiac silhouette. No evident pneumothorax. Electronically Signed   By: Lowella Grip III M.D.   On: 06/25/2017 07:05   Dg Chest 2 View  Result Date: 06/24/2017 CLINICAL DATA:  Shortness of breath. History of COPD and lung cancer. EXAM: CHEST - 2 VIEW COMPARISON:  Chest radiograph June 21, 2017 FINDINGS: Cardiomediastinal silhouette is normal. Similar small right greater left pleural effusions. Mild asymmetrically prominent right interstitium is unchanged. Spiculated left upper lobe nodule. Right single-lumen chest Port-A-Cath distal tip projecting cavoatrial junction. No pneumothorax. Soft tissue planes and included osseous structures are not suspicious. IMPRESSION: 1. Stable small right greater left pleural effusion. Asymmetric right interstitial prominence, possibly secondary to pulmonary edema. 2. Redemonstration of spiculated left upper lobe nodule. Electronically Signed   By: Elon Alas M.D.   On: 06/24/2017 04:35     Assessment/Plan Principal Problem:   SOB (shortness of breath) Active Problems:   Tobacco abuse   Adenocarcinoma of left lung, stage 4 ([facility])   Abdominal pain   COPD (chronic obstructive pulmonary  disease) ([facility])   HTN (hypertension)   Anemia   CKD (chronic kidney disease), stage III ([facility])   AKI (acute kidney injury) (HCC)  Shortness of breath: Chronic problem.  Most likely associated with COPD exacerbation, lung cancer.  Chest x-ray did not show any pneumonia.    COPD exacerbation: On 2 L oxygen at home.  Continue supplemental oxygen as needed. Started on low dose steroids. Continue bronchodilators,  Mucolytics. Patient continues to smoke.  Smoker: Reports that he smokes 1 to 2 cigarettes a day.  Counseled for smoking cessation.  Adenocarcinoma of the left lung, stage IV: Follows with oncology as an outpatient.  Has a Chemo-Port on the right chest.  Hypertension: Blood pressure stable.  Continue home  meds.  Anemia: Most likely chronic anemia associated with malignancy.  Currently H and H is stable.  Recently had blood transfusion.  AKI and CKD stage 3: Kidney function slightly worsened from his baseline.  Baseline creatinine ranges from 1.5-1.7.  Patient looks dry during my evaluation.  Continue gentle IV fluids.  Check BMP tomorrow.  History of CHF: Currently dehydrated.  Will hold Lasix .Does not have any peripheral edema.  Constipation: Patient on opiates for lung cancer and dyspnea.  Continue bowel regimen for constipation.   Severity of Illness: The appropriate patient status for this patient is OBSERVATION.     DVT prophylaxis: Heparin [place] Code Status: Full Family Communication: None Present at the bedside Consults called: None     Shelly Coss MD [facility] Pager [phone]  If 7PM-7AM, please contact night-coverage [email or web address] Password [email or web address]  06/25/2017, 11:18 AM

## 2017-06-25 NOTE — ED Notes (Signed)
ED TO INPATIENT HANDOFF REPORT  Name/Age/Gender [name] 57 y.o. male  Code Status    Code Status Orders  (From admission, onward)        Start     Ordered   06/25/17 1118  Full code  Continuous     06/25/17 1117    Code Status History    Date Active Date Inactive Code Status Order ID Comments User Context   06/21/2017 1044 06/23/2017 2044 DNR [id number]  Mariel Aloe, MD ED   06/05/2017 1602 06/10/2017 2253 Full Code [id number]  Purohit, Konrad Dolores, MD Inpatient   05/28/2017 1147 05/31/2017 2012 DNR [id number]  Nita Sells, MD ED   04/10/2017 1148 04/17/2017 1837 Full Code [id number]  Damita Lack, MD ED   03/09/2017 2359 03/19/2017 1931 Full Code [id number]  Purohit, Konrad Dolores, MD Inpatient   02/14/2017 0902 02/16/2017 1455 Full Code [id number]  Florencia Reasons, MD Inpatient   02/10/2017 0031 02/14/2017 0902 DNR [id number]  Norval Morton, MD ED   12/30/2016 2325 01/07/2017 2109 DNR [id number]  Elodia [name]., MD Inpatient   12/17/2016 0934 12/24/2016 1811 DNR [id number]  Thurnell Lose, MD Inpatient   12/16/2016 1649 12/17/2016 0934 Full Code [id number]  Charlynne Cousins, MD Inpatient      Home/SNF/Other Home  Chief Complaint shortness of breath  Level of Care/Admitting Diagnosis ED Disposition    ED Disposition Condition [facility] Area: [facility] [[id number]]  Level of Care: Med-Surg [16]  Diagnosis: COPD (chronic obstructive pulmonary disease) [facility]) [431540]  Admitting Physician: Shelly Coss [[id number]]  Attending Physician: Shelly Coss [[id number]]  PT Class (Do Not Modify): Observation [104]  PT Acc Code (Do Not Modify): Observation [10022]       Medical History Past Medical History:  Diagnosis Date  . Abdominal pain 06/04/2016  . Adenocarcinoma of left lung, stage 4 ([place]) 05/02/2016  . Alcohol abuse   . Bronchitis due to tobacco use ([place])   . Cancer ([place])    Lung  . COPD (chronic obstructive pulmonary disease)  ([place])   . Dehydration 06/04/2016  . Encounter for antineoplastic chemotherapy 05/02/2016  . Gastritis   . Goals of care, counseling/discussion 05/02/2016  . Hematemesis   . HTN (hypertension) 10/30/2016  . Recurrent upper respiratory infection (URI)   . Seizures ([place]) 05/2011   new onset  . Shortness of breath     Allergies No Known Allergies  IV Location/Drains/Wounds Patient Lines/Drains/Airways Status   Active Line/Drains/Airways    Name:   Placement date:   Placement time:   Site:   Days:   Implanted Port 05/13/16 Right Chest   05/13/16    -    Chest   408          Labs/Imaging Results for orders placed or performed during the hospital encounter of 06/25/17 (from the past 48 hour(s))  Comprehensive metabolic panel     Status: Abnormal   Collection Time: 06/25/17  7:50 AM  Result Value Ref Range   Sodium 139 135 - 145 mmol/L   Potassium 4.6 3.5 - 5.1 mmol/L   Chloride 94 (L) 101 - 111 mmol/L   CO2 32 22 - 32 mmol/L   Glucose, Bld 139 (H) 65 - 99 mg/dL   [name] 28 (H) 6 - 20 mg/dL   Creatinine, Ser 2.14 (H) 0.61 - 1.24 mg/dL   Calcium 9.3 8.9 - 10.3 mg/dL   Total Protein 6.8 6.5 - 8.1 g/dL  Albumin 3.3 (L) 3.5 - 5.0 g/dL   AST 44 (H) 15 - 41 U/L   ALT 47 17 - 63 U/L   Alkaline Phosphatase 115 38 - 126 U/L   Total Bilirubin 0.3 0.3 - 1.2 mg/dL   GFR calc non Af Amer 33 (L) >60 mL/min   GFR calc Af Amer 38 (L) >60 mL/min    Comment: (NOTE) The eGFR has been calculated using the CKD EPI equation. This calculation has not been validated in all clinical situations. eGFR's persistently <60 mL/min signify possible Chronic Kidney Disease.    Anion gap 13 5 - 15    Comment: Performed at [facility], [place] [address], [place], [place] [address]  CBC with Differential     Status: Abnormal   Collection Time: 06/25/17  7:50 AM  Result Value Ref Range   WBC 7.2 4.0 - 10.5 K/uL   RBC 2.74 (L) 4.22 - 5.81 MIL/uL   Hemoglobin 8.8 (L) 13.0 - 17.0 g/dL   HCT  26.9 (L) 39.0 - 52.0 %   MCV 98.2 78.0 - 100.0 fL   MCH 32.1 26.0 - 34.0 pg   MCHC 32.7 30.0 - 36.0 g/dL   RDW 14.8 11.5 - 15.5 %   Platelets 229 150 - 400 K/uL   Neutrophils Relative % 81 %   Neutro Abs 5.9 1.7 - 7.7 K/uL   Lymphocytes Relative 8 %   Lymphs Abs 0.6 (L) 0.7 - 4.0 K/uL   Monocytes Relative 11 %   Monocytes Absolute 0.8 0.1 - 1.0 K/uL   Eosinophils Relative 0 %   Eosinophils Absolute 0.0 0.0 - 0.7 K/uL   Basophils Relative 0 %   Basophils Absolute 0.0 0.0 - 0.1 K/uL    Comment: Performed at [facility], [place] [address], [place] [address]  Brain natriuretic peptide     Status: None   Collection Time: 06/25/17  7:50 AM  Result Value Ref Range   B Natriuretic Peptide 89.9 0.0 - 100.0 pg/mL    Comment: Performed at [facility], [place] [address] [place],  [address]  I-Stat Troponin, ED (not at [facility])     Status: None   Collection Time: 06/25/17  7:58 AM  Result Value Ref Range   Troponin i, poc 0.02 0.00 - 0.08 ng/mL   Comment 3            Comment: Due to the release kinetics of cTnI, a negative result within the first hours of the onset of symptoms does not rule out myocardial infarction with certainty. If myocardial infarction is still suspected, repeat the test at appropriate intervals.   POC occult blood, ED Provider will collect     Status: Abnormal   Collection Time: 06/25/17 11:53 AM  Result Value Ref Range   Fecal Occult Bld POSITIVE (A) NEGATIVE   Dg Chest 2 View  Result Date: 06/25/2017 CLINICAL DATA:  Shortness of breath.  History of lung carcinoma EXAM: CHEST - 2 VIEW COMPARISON:  June 24, 2017 chest radiograph and chest CT April 13, 2017 FINDINGS: Port-A-Cath tip is in superior vena cava. No pneumothorax. There are small pleural effusions bilaterally with bibasilar scarring. The irregular nodular opacity in the left upper lobe medially is stable, measuring 2.2 x 2.1 cm. No new opacity is evident. Heart  size and pulmonary vascularity are normal. No adenopathy is appreciable. No bone lesions. IMPRESSION: Spiculated nodular lesion left upper lobe medially. Neoplastic focus must be of  concern given this appearance. Small pleural effusions with mild bibasilar scarring noted. Stable cardiac silhouette. No evident pneumothorax. Electronically Signed   By: Lowella Grip III M.D.   On: 06/25/2017 07:05   Dg Chest 2 View  Result Date: 06/24/2017 CLINICAL DATA:  Shortness of breath. History of COPD and lung cancer. EXAM: CHEST - 2 VIEW COMPARISON:  Chest radiograph June 21, 2017 FINDINGS: Cardiomediastinal silhouette is normal. Similar small right greater left pleural effusions. Mild asymmetrically prominent right interstitium is unchanged. Spiculated left upper lobe nodule. Right single-lumen chest Port-A-Cath distal tip projecting cavoatrial junction. No pneumothorax. Soft tissue planes and included osseous structures are not suspicious. IMPRESSION: 1. Stable small right greater left pleural effusion. Asymmetric right interstitial prominence, possibly secondary to pulmonary edema. 2. Redemonstration of spiculated left upper lobe nodule. Electronically Signed   By: Elon Alas M.D.   On: 06/24/2017 04:35    Pending Labs Unresulted Labs (From admission, onward)   Start     Ordered   06/26/17 4695  Basic metabolic panel  Tomorrow morning,   R     06/25/17 1117   06/26/17 0500  CBC  Tomorrow morning,   R     06/25/17 1117   06/25/17 1117  CBC  (heparin)  Once,   R    Comments:  Baseline for heparin therapy IF NOT ALREADY DRAWN.  Notify MD if PLT < 100 K.    06/25/17 1117   06/25/17 1117  Creatinine, serum  (heparin)  Once,   R    Comments:  Baseline for heparin therapy IF NOT ALREADY DRAWN.    06/25/17 1117      Vitals/Pain Today's Vitals   06/25/17 0930 06/25/17 1000 06/25/17 1100 06/25/17 1230  BP: 123/81 126/80 118/79 120/80  Pulse: 89 79 76 78  Resp: 16 (!) 26    SpO2: 100% 98% 100%  100%  Weight:      Height:      PainSc:        Isolation Precautions No active isolations  Medications Medications  ipratropium-albuterol (DUONEB) 0.5-2.5 (3) MG/3ML nebulizer solution 3 mL (has no administration in time range)  methylPREDNISolone sodium succinate (SOLU-MEDROL) 40 mg/mL injection 40 mg (has no administration in time range)  guaiFENesin (MUCINEX) 12 hr tablet 600 mg (has no administration in time range)  0.9 %  sodium chloride infusion (has no administration in time range)  escitalopram (LEXAPRO) tablet 5 mg (has no administration in time range)  oxyCODONE (Oxy IR/ROXICODONE) immediate release tablet 10 mg (has no administration in time range)  pantoprazole (PROTONIX) EC tablet 40 mg (has no administration in time range)  polyethylene glycol (MIRALAX / GLYCOLAX) packet 17 g (has no administration in time range)  heparin injection 5,000 Units (has no administration in time range)  metoprolol tartrate (LOPRESSOR) tablet 12.5 mg (has no administration in time range)  senna-docusate (Senokot-S) tablet 1 tablet (has no administration in time range)  ipratropium-albuterol (DUONEB) 0.5-2.5 (3) MG/3ML nebulizer solution 3 mL (3 mLs Nebulization Given 06/25/17 0642)  methylPREDNISolone sodium succinate (SOLU-MEDROL) 125 mg/2 mL injection 125 mg (125 mg Intravenous Given 06/25/17 0834)  ipratropium-albuterol (DUONEB) 0.5-2.5 (3) MG/3ML nebulizer solution 3 mL (3 mLs Nebulization Given 06/25/17 0835)    Mobility walks with device

## 2017-06-25 NOTE — ED Notes (Signed)
Pt ambulated in hall. O2 sensor dropped to 86 while ambulating. Pt was short of breath when walking.

## 2017-06-25 NOTE — ED Triage Notes (Signed)
PT brought in by EMS for c/o shortness of breath  EMS reports fire dept was on scene prior to their arrival and reported his sats in the 80s and placed pt on a NRBM  EMS placed pt in the truck for transport and pt refusedthe mask so they placed him on Rachel at 4 liters with sats coming up to 100%  Pt has hx of lung cancer  Pt has a portacath

## 2017-06-25 NOTE — ED Provider Notes (Signed)
[facility] DEPT Provider Note   CSN: [id number] Arrival date & time: 06/25/17  0604     History   Chief Complaint Chief Complaint  Patient presents with  . Shortness of Breath    HPI [name] is a 57 y.o. male with PMH/o Adenocarcinoma of lung, COPD, HTN, CHF, brought in by EMS for evaluation of shortness of breath.  Patient reports that he has been having some progressively worsening difficulty breathing over the last 2 days.  He states it acutely worsened at approximate 9 PM last night.  Patient reports that he is breathing treatments and inhaler at home with no significant improvement.  Patient reports that he is on 2 L O2 as needed but states that recently, he has had to use it continuously.  Patient reports that he recently had a transfusion a few days ago secondary to his blood levels being low.  Patient reports that he has had some intermittent nausea with few episodes of vomiting.  Patient states that his emesis is like the food has been trying to eat but has some slight tinges of blood.  No gross hematemesis.  Patient denies any fevers, chest pain.  The history is provided by the patient.    Past Medical History:  Diagnosis Date  . Abdominal pain 06/04/2016  . Adenocarcinoma of left lung, stage 4 ([place]) 05/02/2016  . Alcohol abuse   . Bronchitis due to tobacco use ([place])   . Cancer ([place])    Lung  . COPD (chronic obstructive pulmonary disease) ([place])   . Dehydration 06/04/2016  . Encounter for antineoplastic chemotherapy 05/02/2016  . Gastritis   . Goals of care, counseling/discussion 05/02/2016  . Hematemesis   . HTN (hypertension) 10/30/2016  . Recurrent upper respiratory infection (URI)   . Seizures ([place]) 05/2011   new onset  . Shortness of breath     Patient Active Problem List   Diagnosis Date Noted  . AKI (acute kidney injury) ([place]) 06/25/2017  . Dyspnea 06/21/2017  . Chronic combined systolic (congestive) and diastolic  (congestive) heart failure ([place])   . Constipation 06/07/2017  . Pain   . Chronic respiratory failure with hypoxia ([place]) 04/01/2017  . Leucocytosis 03/16/2017  . CKD (chronic kidney disease), stage III ([place]) 03/12/2017  . Malnutrition of moderate degree 03/10/2017  . COPD with acute exacerbation ([place]) 03/09/2017  . Anemia 02/07/2017  . Severe malnutrition ([place]) 01/03/2017  . DOE (dyspnea on exertion) 12/30/2016  . Nausea   . Hypervolemia   . HCAP (healthcare-associated pneumonia) 12/19/2016  . SOB (shortness of breath) 12/19/2016  . Erosive gastropathy 12/18/2016  . Gastritis 12/18/2016  . Hematemesis 12/16/2016  . Intractable nausea and vomiting 12/16/2016  . HTN (hypertension) 10/30/2016  . Chronic obstructive pulmonary disease ([place]) 09/02/2016  . COPD (chronic obstructive pulmonary disease) ([place]) 08/19/2016  . Dehydration 06/04/2016  . Abdominal pain 06/04/2016  . 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 Adenocarcinoma of left lung, stage 4 ([place]) 05/02/2016  . Goals of care, counseling/discussion 05/02/2016  . Encounter for antineoplastic chemotherapy 05/02/2016  . Tobacco abuse 05/30/2011  . Alcohol abuse 05/30/2011  . Seizure ([place]) 05/28/2011    Past Surgical History:  Procedure Laterality Date  . ESOPHAGOGASTRODUODENOSCOPY (EGD) WITH PROPOFOL N/A 12/17/2016   Procedure: ESOPHAGOGASTRODUODENOSCOPY (EGD) WITH PROPOFOL;  Surgeon: Ladene Artist, MD;  Location: [facility] ENDOSCOPY;  Service: Endoscopy;  Laterality: N/A;  . IR FLUORO GUIDE PORT INSERTION RIGHT  05/13/2016  . IR US GUIDE VASC ACCESS RIGHT  05/13/2016  . NO PAST SURGERIES          Home Medications    Prior to Admission medications   Medication Sig Start Date End Date Taking? Authorizing Provider  acetaminophen (TYLENOL) 500 MG tablet Take 1,000 mg by mouth 2 (two) times daily as needed for mild pain.   Yes [provider]  albuterol (PROVENTIL) (2.5 MG/3ML) 0.083% nebulizer solution 1 neb every 4-6 hours  as needed for wheezing and shortness of breath Patient taking differently: Take 2.5 mg by nebulization every 4 (four) hours as needed for wheezing or shortness of breath.  04/01/17  Yes Parrett, Tammy S, NP  Chlorphen-Phenyleph-ASA (ALKA-SELTZER PLUS COLD PO) Take 2 tablets by mouth at bedtime as needed (COUGH).   Yes [provider]  dexamethasone (DECADRON) 4 MG tablet Take 4 mg by mouth 2 (two) times daily. The day before, the day of, and the day after chemotherapy every three weeks   Yes [provider]  escitalopram (LEXAPRO) 5 MG tablet Take 1 tablet (5 mg total) by mouth at bedtime. 04/17/17  Yes Rosita Fire, MD  feeding supplement, ENSURE ENLIVE, (ENSURE ENLIVE) LIQD Take 237 mLs by mouth 3 (three) times daily between meals. 03/19/17  Yes Shelly Coss, MD  fluconazole (DIFLUCAN) 100 MG tablet Take 1 tablet (100 mg total) by mouth daily. Patient taking differently: Take 100 mg by mouth daily. Continuous 03/20/17  Yes Adhikari, Tamsen Meek, MD  Fluticasone-Umeclidin-Vilant (TRELEGY ELLIPTA) 100-62.5-25 MCG/INH AEPB Inhale 1 puff into the lungs daily. 05/31/17  Yes Aline August, MD  furosemide (LASIX) 20 MG tablet Take 1 tablet (20 mg total) by mouth daily. 06/10/17  Yes Eugenie Filler, MD  guaiFENesin (MUCINEX) 600 MG 12 hr tablet Take 1,200 mg by mouth 2 (two) times daily.    Yes [provider]  guaiFENesin-dextromethorphan (ROBITUSSIN DM) 100-10 MG/5ML syrup Take 5 mLs by mouth every 4 (four) hours as needed for cough. 05/31/17  Yes Aline August, MD  Ipratropium-Albuterol (COMBIVENT) 20-100 MCG/ACT AERS respimat Inhale 1 puff into the lungs every 6 (six) hours. 05/31/17  Yes Aline August, MD  loratadine (CLARITIN) 10 MG tablet Take 10 mg by mouth daily.   Yes [provider]  magnesium hydroxide (MILK OF MAGNESIA) 400 MG/5ML suspension Take 30 mLs by mouth daily. 06/11/17  Yes Eugenie Filler, MD  metoprolol tartrate (LOPRESSOR) 25 MG tablet Take  12.5 mg by mouth 2 (two) times daily.    Yes [provider]  Multiple Vitamin (MULTIVITAMIN WITH MINERALS) TABS tablet Take 1 tablet by mouth daily. 06/23/17  Yes Mikhail, Maryann, DO  ondansetron (ZOFRAN-ODT) 4 MG disintegrating tablet Take 4 mg by mouth every 8 (eight) hours as needed for nausea or vomiting.   Yes [provider]  Oxycodone HCl 10 MG TABS Take 10 mg by mouth every 4 (four) hours as needed (pain/shortness of breath).    Yes [provider]  pantoprazole (PROTONIX) 40 MG tablet Take 1 tablet (40 mg total) by mouth 2 (two) times daily. 12/18/16  Yes Eugenie Filler, MD  polyethylene glycol [facility] / Floria Raveling) packet Take 17 g by mouth 2 (two) times daily. 06/10/17  Yes Eugenie Filler, MD  senna-docusate (SENNA-PLUS) 8.6-50 MG tablet Take 1 tablet by mouth 2 (two) times daily. 06/10/17  Yes Eugenie Filler, MD    Family History Family History  Problem Relation Age of Onset  . Cancer Father   . Diabetes Mellitus II Mother     Social  History Social History   Tobacco Use  . Smoking status: Current Some Day Smoker    Packs/day: 0.10    Years: 30.00    Pack years: 3.00    Types: Cigarettes  . Smokeless tobacco: Never Used  . Tobacco comment: 1-2 cig a day   Substance Use Topics  . Alcohol use: No    Frequency: Never  . Drug use: No     Allergies   Patient has no known allergies.   Review of Systems Review of Systems  Constitutional: Negative for fever.  Respiratory: Positive for shortness of breath and wheezing. Negative for cough.   Cardiovascular: Negative for chest pain.  Gastrointestinal: Negative for abdominal pain, nausea and vomiting.  Genitourinary: Negative for dysuria and hematuria.  Neurological: Negative for headaches.  All other systems reviewed and are negative.    Physical Exam Updated Vital Signs BP 124/88 (BP Location: Left Arm)   Pulse 75   Temp 97.8 F (36.6 C) (Oral)   Resp 18   Ht 5\' 11"  (1.803  m)   Wt 48.8 kg (107 lb 9.4 oz)   SpO2 100%   BMI 15.00 kg/m   Physical Exam  Constitutional: He is oriented to person, place, and time. He appears well-developed and well-nourished.  Appears uncomfortable but no acute distress   HENT:  Head: Normocephalic and atraumatic.  Mouth/Throat: Oropharynx is clear and moist and mucous membranes are normal.  Eyes: Pupils are equal, round, and reactive to light. Conjunctivae, EOM and lids are normal.  Neck: Full passive range of motion without pain.  Cardiovascular: Normal rate, regular rhythm, normal heart sounds and normal pulses. Exam reveals no gallop and no friction rub.  No murmur heard. Pulses:      Radial pulses are 2+ on the right side, and 2+ on the left side.  Pulmonary/Chest: Effort normal. Tachypnea noted. He has decreased breath sounds. He has wheezes.  Increased work of breathing noted.  Tachypnea noted.  Faint wheezing noted.  Decreased breath sounds noted throughout.  Abdominal: Soft. Normal appearance. There is no tenderness. There is no rigidity and no guarding.  Musculoskeletal: Normal range of motion.  Lateral lower extremities are symmetric in appearance.  No evidence of edema.  Neurological: He is alert and oriented to person, place, and time.  Skin: Skin is warm and dry. Capillary refill takes less than 2 seconds.  Psychiatric: He has a normal mood and affect. His speech is normal.  Nursing note and vitals reviewed.    ED Treatments / Results  Labs (all labs ordered are listed, but only abnormal results are displayed) Labs Reviewed  COMPREHENSIVE METABOLIC PANEL - Abnormal; Notable for the following components:      Result Value   Chloride 94 (*)    Glucose, Bld 139 (*)    BUN 28 (*)    Creatinine, Ser 2.14 (*)    Albumin 3.3 (*)    AST 44 (*)    GFR calc non Af Amer 33 (*)    GFR calc Af Amer 38 (*)    All other components within normal limits  CBC WITH DIFFERENTIAL/PLATELET - Abnormal; Notable for the  following components:   RBC 2.74 (*)    Hemoglobin 8.8 (*)    HCT 26.9 (*)    Lymphs Abs 0.6 (*)    All other components within normal limits  POC OCCULT BLOOD, ED - Abnormal; Notable for the following components:   Fecal Occult Bld POSITIVE (*)    All  other components within normal limits  BRAIN NATRIURETIC PEPTIDE  I-STAT TROPONIN, ED    EKG EKG Interpretation  Date/Time:  Wednesday June 25 2017 07:26:15 EDT Ventricular Rate:  74 PR Interval:    QRS Duration: 82 QT Interval:  436 QTC Calculation: 484 R Axis:   115 Text Interpretation:  Sinus rhythm Anterolateral infarct, old ST elevation, consider inferior injury No significant change since last tracing Confirmed by Addison Lank [phone]) on 06/25/2017 8:38:05 AM   Radiology Dg Chest 2 View  Result Date: 06/25/2017 CLINICAL DATA:  Shortness of breath.  History of lung carcinoma EXAM: CHEST - 2 VIEW COMPARISON:  June 24, 2017 chest radiograph and chest CT April 13, 2017 FINDINGS: Port-A-Cath tip is in superior vena cava. No pneumothorax. There are small pleural effusions bilaterally with bibasilar scarring. The irregular nodular opacity in the left upper lobe medially is stable, measuring 2.2 x 2.1 cm. No new opacity is evident. Heart size and pulmonary vascularity are normal. No adenopathy is appreciable. No bone lesions. IMPRESSION: Spiculated nodular lesion left upper lobe medially. Neoplastic focus must be of concern given this appearance. Small pleural effusions with mild bibasilar scarring noted. Stable cardiac silhouette. No evident pneumothorax. Electronically Signed   By: Lowella Grip III M.D.   On: 06/25/2017 07:05   Dg Chest 2 View  Result Date: 06/24/2017 CLINICAL DATA:  Shortness of breath. History of COPD and lung cancer. EXAM: CHEST - 2 VIEW COMPARISON:  Chest radiograph June 21, 2017 FINDINGS: Cardiomediastinal silhouette is normal. Similar small right greater left pleural effusions. Mild asymmetrically prominent  right interstitium is unchanged. Spiculated left upper lobe nodule. Right single-lumen chest Port-A-Cath distal tip projecting cavoatrial junction. No pneumothorax. Soft tissue planes and included osseous structures are not suspicious. IMPRESSION: 1. Stable small right greater left pleural effusion. Asymmetric right interstitial prominence, possibly secondary to pulmonary edema. 2. Redemonstration of spiculated left upper lobe nodule. Electronically Signed   By: Elon Alas M.D.   On: 06/24/2017 04:35    Procedures Procedures (including critical care time)  Medications Ordered in ED Medications  ipratropium-albuterol (DUONEB) 0.5-2.5 (3) MG/3ML nebulizer solution 3 mL (3 mLs Nebulization Given 06/25/17 1505)  methylPREDNISolone sodium succinate (SOLU-MEDROL) 40 mg/mL injection 40 mg (40 mg Intravenous Given 06/25/17 1432)  guaiFENesin (MUCINEX) 12 hr tablet 600 mg (600 mg Oral Given 06/25/17 1432)  0.9 %  sodium chloride infusion ( Intravenous Transfusing/Transfer 06/25/17 1320)  escitalopram (LEXAPRO) tablet 5 mg (has no administration in time range)  oxyCODONE (Oxy IR/ROXICODONE) immediate release tablet 10 mg (10 mg Oral Given 06/25/17 1431)  pantoprazole (PROTONIX) EC tablet 40 mg (40 mg Oral Given 06/25/17 1432)  polyethylene glycol (MIRALAX / GLYCOLAX) packet 17 g (has no administration in time range)  heparin injection 5,000 Units (has no administration in time range)  metoprolol tartrate (LOPRESSOR) tablet 12.5 mg (12.5 mg Oral Given 06/25/17 1431)  senna-docusate (Senokot-S) tablet 1 tablet (has no administration in time range)  albuterol (PROVENTIL) (2.5 MG/3ML) 0.083% nebulizer solution 2.5 mg (has no administration in time range)  ipratropium-albuterol (DUONEB) 0.5-2.5 (3) MG/3ML nebulizer solution 3 mL (3 mLs Nebulization Given 06/25/17 0642)  methylPREDNISolone sodium succinate (SOLU-MEDROL) 125 mg/2 mL injection 125 mg (125 mg Intravenous Given 06/25/17 0834)  ipratropium-albuterol (DUONEB)  0.5-2.5 (3) MG/3ML nebulizer solution 3 mL (3 mLs Nebulization Given 06/25/17 0835)     Initial Impression / Assessment and Plan / ED Course  I have reviewed the triage vital signs and the nursing notes.  Pertinent labs &  imaging results that were available during my care of the patient were reviewed by me and considered in my medical decision making (see chart for details).     82 male with past medical history of COPD, lung cancer, CHF who presents for evaluation of shortness of breath.  Reports that has been worsening but acutely worsened last night.  Took his inhalers at home with no improvement.  Is on 2 L of oxygen intermittently but has had to use it continuously since symptoms.  No fevers.  Had some intermittent vomiting. Patient is afebrile, non-toxic appearing. Vital signs reviewed and stable.  Patient is on 100% on 2 L O2.  On exam, he appears tight with limited air movement.  Mild wheezing noted.  Abdomen is soft, non-distended.  Plan to give duo nebs and steroids.  Plan to check labs, EKG, chest x-ray.  Troponin negative.  CBC shows hemoglobin of 8.8 and hematocrit is 26.9.  Hemoglobin had dropped from yesterday which was previously 9.7.  He did have a transfusion yesterday.  He denies any blood in vomit or blood in stools.  CMP shows chloride of 94.  BUN is 28 and creatinine is 2.14.  AST is slightly elevated at 44.  2.1 for his acute elevation from his previous blood work done yesterday at 1.72.  Chest x-ray shows a spiculated nodular lesion in left upper lobe medially.  Small pleural effusions.  Otherwise no abnormality.    Reevaluation after initial DuoNeb.  Air movement improved.  Patient reports some mild improvement in symptoms.  Will give additional nebulizer treatment.  Reevaluation.  Improved air movement.  Plan to ambulate patient in the ED.  Patient sat up from sitting in the bed, his O2 sats dropped to 88%.  When attempting to ambulate patient, he dropped to 86% and became  very dyspneic.  Concerns of persistent dyspnea with COPD exacerbation and continued dropping O2 sats, will plan for admission.  Discussed with hospitalist.  Agrees for admission.  Recommends fecal occult.    Final Clinical Impressions(s) / ED Diagnoses   Final diagnoses:  COPD exacerbation [facility])  Shortness of breath    ED Discharge Orders    None       Desma Mcgregor 06/25/17 1620    Fatima Blank, MD 06/25/17 2348

## 2017-06-25 NOTE — ED Notes (Signed)
EDPA Provider at bedside. 

## 2017-06-26 DIAGNOSIS — J441 Chronic obstructive pulmonary disease with (acute) exacerbation: Principal | ICD-10-CM

## 2017-06-26 DIAGNOSIS — R0602 Shortness of breath: Secondary | ICD-10-CM | POA: Diagnosis not present

## 2017-06-26 DIAGNOSIS — D649 Anemia, unspecified: Secondary | ICD-10-CM | POA: Diagnosis not present

## 2017-06-26 DIAGNOSIS — C3492 Malignant neoplasm of unspecified part of left bronchus or lung: Secondary | ICD-10-CM | POA: Diagnosis not present

## 2017-06-26 LAB — BASIC METABOLIC PANEL
ANION GAP: 7 (ref 5–15)
BUN: 24 mg/dL — ABNORMAL HIGH (ref 6–20)
CALCIUM: 9 mg/dL (ref 8.9–10.3)
CO2: 32 mmol/L (ref 22–32)
Chloride: 101 mmol/L (ref 101–111)
Creatinine, Ser: 1.65 mg/dL — ABNORMAL HIGH (ref 0.61–1.24)
GFR, EST AFRICAN AMERICAN: 52 mL/min — AB (ref >60)
GFR, EST NON AFRICAN AMERICAN: 45 mL/min — AB (ref >60)
Glucose, Bld: 137 mg/dL — ABNORMAL HIGH (ref 65–99)
Potassium: 4.4 mmol/L (ref 3.5–5.1)
SODIUM: 140 mmol/L (ref 135–145)

## 2017-06-26 LAB — CBC
HCT: 22.9 % — ABNORMAL LOW (ref 39.0–52.0)
Hemoglobin: 7.7 g/dL — ABNORMAL LOW (ref 13.0–17.0)
MCH: 33.2 pg (ref 26.0–34.0)
MCHC: 33.6 g/dL (ref 30.0–36.0)
MCV: 98.7 fL (ref 78.0–100.0)
PLATELETS: 214 10*3/uL (ref 150–400)
RBC: 2.32 MIL/uL — AB (ref 4.22–5.81)
RDW: 14.9 % (ref 11.5–15.5)
WBC: 8.9 10*3/uL (ref 4.0–10.5)

## 2017-06-26 LAB — CULTURE, BLOOD (ROUTINE X 2)
CULTURE: NO GROWTH
Culture: NO GROWTH
SPECIAL REQUESTS: ADEQUATE
Special Requests: ADEQUATE

## 2017-06-26 MED ORDER — IPRATROPIUM-ALBUTEROL 0.5-2.5 (3) MG/3ML IN SOLN
3.0000 mL | Freq: Three times a day (TID) | RESPIRATORY_TRACT | Status: DC
  Administered 2017-06-26 – 2017-06-27 (×2): 3 mL via RESPIRATORY_TRACT
  Filled 2017-06-26 (×3): qty 3

## 2017-06-26 MED ORDER — SODIUM CHLORIDE 0.9 % IV SOLN
500.0000 mg | INTRAVENOUS | Status: DC
  Administered 2017-06-26 – 2017-06-29 (×4): 500 mg via INTRAVENOUS
  Filled 2017-06-26 (×4): qty 500

## 2017-06-26 MED ORDER — ONDANSETRON HCL 4 MG/2ML IJ SOLN
4.0000 mg | Freq: Four times a day (QID) | INTRAMUSCULAR | Status: DC | PRN
  Administered 2017-06-26 – 2017-06-29 (×8): 4 mg via INTRAVENOUS
  Filled 2017-06-26 (×8): qty 2

## 2017-06-26 MED ORDER — PREDNISONE 20 MG PO TABS
60.0000 mg | ORAL_TABLET | Freq: Every day | ORAL | Status: DC
Start: 2017-06-26 — End: 2017-06-27
  Administered 2017-06-26 – 2017-06-27 (×2): 60 mg via ORAL
  Filled 2017-06-26 (×2): qty 3

## 2017-06-26 MED ORDER — ENSURE ENLIVE PO LIQD
237.0000 mL | Freq: Two times a day (BID) | ORAL | Status: DC
  Administered 2017-06-27 – 2017-06-28 (×3): 237 mL via ORAL

## 2017-06-26 NOTE — Progress Notes (Signed)
CSW consulted to assist with disposition as pt is resident of facility- [name]. Pt known to CSW from previous admissions. 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 Admitted under observation for shortness of breath. Uses home O2 at ALF. Pt confirms plan to return to [name]. CSW spoke with facility as well. Facility shares concern that "anxiety may be leading to him going to the ED. He is getting sicker and feels that being in the [name] where a doctor is there if something happens is safer. He may need something more for his anxiety." Will share with team. Will follow to assist with transition back to ALF at DC.  [name], [name], [name] Clinical Social Work 06/26/2017 [phone]  (see complete assessment during last hospitalization 06/23/17.)  Clinical Social Work Assessment  Patient Details  Name: [name] MRN: [id number] Date of Birth: 1960/07/05  Date of referral:                   Reason for consult:                         Permission sought to share information with:    Permission granted to share information::   yes verbal permission             Name::                   Agency::   [name]             Relationship::                Contact Information:     Housing/Transportation Living arrangements for the past 2 months:    Source of Information:    Patient Interpreter Needed:    Criminal Activity/Legal Involvement Pertinent to Current Situation/Hospitalization:    Significant Relationships:    Lives with:    Do you feel safe going back to the place where you live?    Need for family participation in patient care:     Care giving concerns:  Patient had no family at bedside. patient stated he is from [place] and would like to return back to facility  Facilities manager / plan:  CSW reached out to facility to verify they would be able to take patient back.CSW spoke with [name] and she stated they will be abel  to take patient back once summary has been faxed to them. CSW faxed summary and fl2 to facility.  Patient requested a Rolator from [name] stating that he feels more comfortable with the equipment because it allows him to set down when he gets tired. CSW relayed patients request to [name]. CSW will contact [facility] on behalf of patient for transport  Employment status:    Insurance information:    PT Recommendations:    Information / Referral to community resources:     Patient/Family's Response to care:  Patient eager to go home  Patient/Family's Understanding of and Emotional Response to Diagnosis, Current Treatment, and Prognosis:  Patient wants to return back to facility  Emotional Assessment Appearance:    Attitude/Demeanor/Rapport:    Affect (typically observed):    Orientation:    Alcohol / Substance use:    Psych involvement (Current and /or in the community):     Discharge Needs  Concerns to be addressed:    Readmission within the last 30 days:    Current discharge risk:  Barriers to Discharge:      Johnathan Snare, [name] 06/23/2017, 2:31 PM
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
## 2017-06-27 DIAGNOSIS — R0602 Shortness of breath: Secondary | ICD-10-CM | POA: Diagnosis not present

## 2017-06-27 DIAGNOSIS — J441 Chronic obstructive pulmonary disease with (acute) exacerbation: Secondary | ICD-10-CM | POA: Diagnosis not present

## 2017-06-27 DIAGNOSIS — C3492 Malignant neoplasm of unspecified part of left bronchus or lung: Secondary | ICD-10-CM

## 2017-06-27 DIAGNOSIS — D649 Anemia, unspecified: Secondary | ICD-10-CM | POA: Diagnosis not present

## 2017-06-27 LAB — COMPREHENSIVE METABOLIC PANEL
ALT: 34 U/L (ref 17–63)
ANION GAP: 5 (ref 5–15)
AST: 33 U/L (ref 15–41)
Albumin: 2.6 g/dL — ABNORMAL LOW (ref 3.5–5.0)
Alkaline Phosphatase: 85 U/L (ref 38–126)
BUN: 21 mg/dL — ABNORMAL HIGH (ref 6–20)
CHLORIDE: 106 mmol/L (ref 101–111)
CO2: 30 mmol/L (ref 22–32)
CREATININE: 1.26 mg/dL — AB (ref 0.61–1.24)
Calcium: 8.9 mg/dL (ref 8.9–10.3)
Glucose, Bld: 103 mg/dL — ABNORMAL HIGH (ref 65–99)
Potassium: 4.4 mmol/L (ref 3.5–5.1)
SODIUM: 141 mmol/L (ref 135–145)
Total Bilirubin: 0.5 mg/dL (ref 0.3–1.2)
Total Protein: 5.6 g/dL — ABNORMAL LOW (ref 6.5–8.1)

## 2017-06-27 LAB — CBC
HCT: 24.1 % — ABNORMAL LOW (ref 39.0–52.0)
Hemoglobin: 7.8 g/dL — ABNORMAL LOW (ref 13.0–17.0)
MCH: 32.5 pg (ref 26.0–34.0)
MCHC: 32.4 g/dL (ref 30.0–36.0)
MCV: 100.4 fL — ABNORMAL HIGH (ref 78.0–100.0)
Platelets: 272 10*3/uL (ref 150–400)
RBC: 2.4 MIL/uL — ABNORMAL LOW (ref 4.22–5.81)
RDW: 15.1 % (ref 11.5–15.5)
WBC: 12.4 10*3/uL — ABNORMAL HIGH (ref 4.0–10.5)

## 2017-06-27 MED ORDER — METHYLPREDNISOLONE SODIUM SUCC 125 MG IJ SOLR
80.0000 mg | Freq: Three times a day (TID) | INTRAMUSCULAR | Status: DC
  Administered 2017-06-27 (×2): 80 mg via INTRAVENOUS
  Filled 2017-06-27 (×2): qty 2

## 2017-06-27 MED ORDER — GUAIFENESIN ER 600 MG PO TB12
600.0000 mg | ORAL_TABLET | Freq: Two times a day (BID) | ORAL | Status: DC
  Administered 2017-06-27 – 2017-06-29 (×4): 600 mg via ORAL
  Filled 2017-06-27 (×4): qty 1

## 2017-06-27 MED ORDER — PRO-STAT SUGAR FREE PO LIQD
30.0000 mL | Freq: Two times a day (BID) | ORAL | Status: DC
  Administered 2017-06-27: 30 mL via ORAL
  Filled 2017-06-27 (×5): qty 30

## 2017-06-27 MED ORDER — IPRATROPIUM-ALBUTEROL 0.5-2.5 (3) MG/3ML IN SOLN
3.0000 mL | Freq: Four times a day (QID) | RESPIRATORY_TRACT | Status: DC
  Administered 2017-06-27 – 2017-06-28 (×3): 3 mL via RESPIRATORY_TRACT
  Filled 2017-06-27 (×3): qty 3

## 2017-06-27 MED ORDER — METHYLPREDNISOLONE SODIUM SUCC 125 MG IJ SOLR
80.0000 mg | Freq: Once | INTRAMUSCULAR | Status: AC
  Administered 2017-06-27: 80 mg via INTRAVENOUS
  Filled 2017-06-27: qty 2

## 2017-06-27 MED ORDER — LORAZEPAM 2 MG/ML IJ SOLN
0.5000 mg | Freq: Four times a day (QID) | INTRAMUSCULAR | Status: DC | PRN
  Filled 2017-06-27: qty 1
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
## 2017-06-27 NOTE — Progress Notes (Signed)
Initial Nutrition Assessment  DOCUMENTATION CODES:   Non-severe (moderate) malnutrition in context of chronic illness, Underweight  INTERVENTION:  - Continue Ensure Enlive BID, each supplement provides 350 kcal and 20 grams of protein. - Continue 30 mL Prostat BID, each supplement provides 100 kcal and 15 grams of protein. - Continue to encourage PO intakes.  NUTRITION DIAGNOSIS:   Moderate Malnutrition related to chronic illness, catabolic illness, cancer and cancer related treatments as evidenced by moderate fat depletion, moderate muscle depletion.  GOAL:   Patient will meet greater than or equal to 90% of their needs, Weight gain  MONITOR:   PO intake, Supplement acceptance, Weight trends, Labs  REASON FOR ASSESSMENT:   Other (Comment)(underweight BMI)    ASSESSMENT:   57 y.o. male with medical history significant of stage IV adenocarcinoma of the lung, COPD, hypertension, CHF who presented to the emergency department for evaluation of shortness of breath.  Patient reported that he has been having progressive shortness of breath since last 2 days.  He has been admitted recently multiple times for the same complaint.  Pt has consumed 100% of all meals since admission on 6/5. He was admitted 6/1-6/4 and was seen by RD on 6/3. At that time, he had been consuming 100% of meals during that admission and consuming Ensure Enlive supplements. Ensure was ordered yesterday per ONS protocol and Prostat was added today; pt has not yet received this supplement. Will monitor for need to adjust ONS.   Pt reports that despite treatment and previous illnesses, his appetite remains very good and he denies abdominal pain/pressure or nausea with eating, no chewing or swallowing issues, and no overt taste changes.   NFPE outlined below. Per chart review, he has lost 5 lbs (4% body weight) in the past 3.5 weeks; this is significant for time frame. Will continue to monitor weight trends closely with  good intakes.  Medications reviewed; 80 mg Solu-medrol TID, 40 mg oral Protonix BID, 1 packet Miralax BID, 1 tablet Senokot BID.  Labs reviewed; creatinine: 1.26 mg/dL.       NUTRITION - FOCUSED PHYSICAL EXAM:    Most Recent Value  Orbital Region  No depletion  Upper Arm Region  Moderate depletion  Thoracic and Lumbar Region  Moderate depletion  Buccal Region  Mild depletion  Temple Region  Mild depletion  Clavicle Bone Region  Moderate depletion  Clavicle and Acromion Bone Region  Moderate depletion  Scapular Bone Region  Mild depletion  Dorsal Hand  Mild depletion  Patellar Region  Moderate depletion  Anterior Thigh Region  Moderate depletion  Posterior Calf Region  Moderate depletion  Edema (RD Assessment)  Mild [BLE]  Hair  Reviewed  Eyes  Reviewed  Mouth  Reviewed  Skin  Reviewed  Nails  Reviewed       Diet Order:   Diet Order           Diet Heart Room service appropriate? Yes; Fluid consistency: Thin  Diet effective now          EDUCATION NEEDS:   No education needs have been identified at this time  Skin:  Skin Assessment: Reviewed RN Assessment  Last BM:  6/3  Height:   Ht Readings from Last 1 Encounters:  06/25/17 5\' 11"  (1.803 m)    Weight:   Wt Readings from Last 1 Encounters:  06/27/17 118 lb (53.5 kg)    Ideal Body Weight:  78.18 kg  BMI:  Body mass index is 16.46 kg/m.  Estimated  Nutritional Needs:   Kcal:  1870-2140 (35-40 kcal/kg)  Protein:  75-90 grams (1.4-1.7 grams/kg)  Fluid:  >/= 1.8 L/day      Jarome Matin, MS, RD, LDN, [facility] Inpatient Clinical Dietitian Pager # [phone] After hours/weekend pager # [phone]

## 2017-06-28 DIAGNOSIS — E860 Dehydration: Secondary | ICD-10-CM | POA: Diagnosis present

## 2017-06-28 DIAGNOSIS — Z79899 Other long term (current) drug therapy: Secondary | ICD-10-CM | POA: Diagnosis not present

## 2017-06-28 DIAGNOSIS — Z681 Body mass index (BMI) 19 or less, adult: Secondary | ICD-10-CM | POA: Diagnosis not present

## 2017-06-28 DIAGNOSIS — C3492 Malignant neoplasm of unspecified part of left bronchus or lung: Secondary | ICD-10-CM | POA: Diagnosis present

## 2017-06-28 DIAGNOSIS — E43 Unspecified severe protein-calorie malnutrition: Secondary | ICD-10-CM | POA: Diagnosis present

## 2017-06-28 DIAGNOSIS — D631 Anemia in chronic kidney disease: Secondary | ICD-10-CM | POA: Diagnosis present

## 2017-06-28 DIAGNOSIS — N183 Chronic kidney disease, stage 3 (moderate): Secondary | ICD-10-CM | POA: Diagnosis present

## 2017-06-28 DIAGNOSIS — F17210 Nicotine dependence, cigarettes, uncomplicated: Secondary | ICD-10-CM | POA: Diagnosis present

## 2017-06-28 DIAGNOSIS — N179 Acute kidney failure, unspecified: Secondary | ICD-10-CM | POA: Diagnosis present

## 2017-06-28 DIAGNOSIS — Z7951 Long term (current) use of inhaled steroids: Secondary | ICD-10-CM | POA: Diagnosis not present

## 2017-06-28 DIAGNOSIS — C7951 Secondary malignant neoplasm of bone: Secondary | ICD-10-CM | POA: Diagnosis present

## 2017-06-28 DIAGNOSIS — Z9981 Dependence on supplemental oxygen: Secondary | ICD-10-CM | POA: Diagnosis not present

## 2017-06-28 DIAGNOSIS — J441 Chronic obstructive pulmonary disease with (acute) exacerbation: Secondary | ICD-10-CM | POA: Diagnosis present

## 2017-06-28 DIAGNOSIS — K5900 Constipation, unspecified: Secondary | ICD-10-CM | POA: Diagnosis present

## 2017-06-28 DIAGNOSIS — Z809 Family history of malignant neoplasm, unspecified: Secondary | ICD-10-CM | POA: Diagnosis not present

## 2017-06-28 DIAGNOSIS — I130 Hypertensive heart and chronic kidney disease with heart failure and stage 1 through stage 4 chronic kidney disease, or unspecified chronic kidney disease: Secondary | ICD-10-CM | POA: Diagnosis present

## 2017-06-28 DIAGNOSIS — R0602 Shortness of breath: Secondary | ICD-10-CM | POA: Diagnosis not present

## 2017-06-28 DIAGNOSIS — Z7952 Long term (current) use of systemic steroids: Secondary | ICD-10-CM | POA: Diagnosis not present

## 2017-06-28 DIAGNOSIS — I5032 Chronic diastolic (congestive) heart failure: Secondary | ICD-10-CM | POA: Diagnosis present

## 2017-06-28 DIAGNOSIS — D630 Anemia in neoplastic disease: Secondary | ICD-10-CM | POA: Diagnosis present

## 2017-06-28 LAB — CBC
HCT: 24.2 % — ABNORMAL LOW (ref 39.0–52.0)
Hemoglobin: 8.1 g/dL — ABNORMAL LOW (ref 13.0–17.0)
MCH: 33.9 pg (ref 26.0–34.0)
MCHC: 33.5 g/dL (ref 30.0–36.0)
MCV: 101.3 fL — ABNORMAL HIGH (ref 78.0–100.0)
Platelets: 268 K/uL (ref 150–400)
RBC: 2.39 MIL/uL — ABNORMAL LOW (ref 4.22–5.81)
RDW: 14.9 % (ref 11.5–15.5)
WBC: 12.5 K/uL — ABNORMAL HIGH (ref 4.0–10.5)

## 2017-06-28 LAB — COMPREHENSIVE METABOLIC PANEL WITH GFR
ALT: 31 U/L (ref 17–63)
AST: 27 U/L (ref 15–41)
Albumin: 2.7 g/dL — ABNORMAL LOW (ref 3.5–5.0)
Alkaline Phosphatase: 86 U/L (ref 38–126)
Anion gap: 7 (ref 5–15)
BUN: 23 mg/dL — ABNORMAL HIGH (ref 6–20)
CO2: 31 mmol/L (ref 22–32)
Calcium: 9 mg/dL (ref 8.9–10.3)
Chloride: 103 mmol/L (ref 101–111)
Creatinine, Ser: 1.19 mg/dL (ref 0.61–1.24)
GFR calc Af Amer: >60 mL/min
GFR calc non Af Amer: >60 mL/min
Glucose, Bld: 127 mg/dL — ABNORMAL HIGH (ref 65–99)
Potassium: 4.5 mmol/L (ref 3.5–5.1)
Sodium: 141 mmol/L (ref 135–145)
Total Bilirubin: 0.2 mg/dL — ABNORMAL LOW (ref 0.3–1.2)
Total Protein: 5.7 g/dL — ABNORMAL LOW (ref 6.5–8.1)

## 2017-06-28 MED ORDER — FLUTICASONE FUROATE-VILANTEROL 100-25 MCG/INH IN AEPB
1.0000 | INHALATION_SPRAY | Freq: Every day | RESPIRATORY_TRACT | Status: DC
  Administered 2017-06-29: 1 via RESPIRATORY_TRACT
  Filled 2017-06-28: qty 28

## 2017-06-28 MED ORDER — GUAIFENESIN-DM 100-10 MG/5ML PO SYRP
5.0000 mL | ORAL_SOLUTION | ORAL | Status: DC | PRN
  Administered 2017-06-28: 5 mL via ORAL
  Filled 2017-06-28: qty 10

## 2017-06-28 MED ORDER — IPRATROPIUM-ALBUTEROL 0.5-2.5 (3) MG/3ML IN SOLN
3.0000 mL | Freq: Three times a day (TID) | RESPIRATORY_TRACT | Status: DC
  Administered 2017-06-28 – 2017-06-29 (×3): 3 mL via RESPIRATORY_TRACT
  Filled 2017-06-28 (×3): qty 3

## 2017-06-28 MED ORDER — FLUTICASONE-UMECLIDIN-VILANT 100-62.5-25 MCG/INH IN AEPB: 1.0000 | INHALATION_SPRAY | Freq: Every day | RESPIRATORY_TRACT | Status: DC

## 2017-06-28 MED ORDER — FUROSEMIDE 20 MG PO TABS
20.0000 mg | ORAL_TABLET | Freq: Every day | ORAL | Status: DC
  Administered 2017-06-28: 20 mg via ORAL
  Filled 2017-06-28 (×2): qty 1

## 2017-06-28 MED ORDER — METHYLPREDNISOLONE SODIUM SUCC 40 MG IJ SOLR
40.0000 mg | Freq: Three times a day (TID) | INTRAMUSCULAR | Status: DC
  Administered 2017-06-28 – 2017-06-29 (×3): 40 mg via INTRAVENOUS
  Filled 2017-06-28 (×3): qty 1

## 2017-06-28 NOTE — Progress Notes (Signed)
PROGRESS NOTE    KORREY SCHLEICHER  WRU:[id number] DOB: 06/22/1960 DOA: 06/25/2017 PCP: [name], MD   Brief Narrative:  57 year old with past medical history relevant for stage IV adenocarcinoma of the lung, COPD intermittently on 2 L nasal cannula, hypertension, grade 2 diastolic dysfunction by echo on 03/18/2017 who was admitted with shortness of breath and is found to have possible COPD exacerbation versus chronic and untreated dyspnea.   Assessment & Plan:   Principal Problem:   SOB (shortness of breath) Active Problems:   Tobacco abuse   Adenocarcinoma of left lung, stage 4 (HCC)   COPD (chronic obstructive pulmonary disease) (HCC)   HTN (hypertension)   Anemia   COPD exacerbation (HCC)   CKD (chronic kidney disease), stage III (HCC)   AKI (acute kidney injury) ([place])   #) Shortness of breath/acute COPD exacerbation: At this time his symptoms and his exam are relatively discordant.  He continues to have significant dyspnea however his exam is fairly reassuring. -Decrease steroids to IV methylprednisolone 40 mg every 8 hours -Continue IV azithromycin - Continue 3 times daily bronchodilators -Continue pulmonary toilet - Continue opiates for both pain and dyspnea -Continue LABA/ICS/L AMA  #) Stage IV adenocarcinoma of the lung: - On treatment  #) Chronic diastolic heart failure: -Continue home furosemide 20 mg daily  #) Hypertension: - Continue metoprolol tartrate 12.5 mg twice daily  #) Pain/psych: -Continue oxycodone 10 mg every 4 hours as needed -Continue escitalopram 5 mg nightly  #) Tobacco abuse: Encourage smoking cessation  Consultants:   None  Procedures:   None  Antimicrobials:    IV azithromycin started 06/27/2017   Subjective: She reports that he is improving but still not quite back to baseline.  He denies any cough, sputum production, fevers, chest pain, nausea, vomiting, diarrhea.  He does continue to have exertional  dyspnea.  Objective: Vitals:   06/27/17 1940 06/27/17 2253 06/28/17 0506 06/28/17 0814  BP:  133/81 (!) 145/84   Pulse:  (!) 57 (!) 49   Resp:  20 (!) 21   Temp:  98.6 F (37 C) 97.9 F (36.6 C)   TempSrc:  Oral Oral   SpO2: 98% 100% 100% 99%  Weight:   53.1 kg (117 lb 1 oz)   Height:        Intake/Output Summary (Last 24 hours) at 06/28/2017 1155 Last data filed at 06/28/2017 0505 Gross per 24 hour  Intake 1202 ml  Output 1000 ml  Net 202 ml   Filed Weights   06/25/17 1343 06/27/17 0515 06/28/17 0506  Weight: 48.8 kg (107 lb 9.4 oz) 53.5 kg (118 lb) 53.1 kg (117 lb 1 oz)    Examination:  General exam: Appears calm and comfortable  Respiratory system: Clear to auscultation. Respiratory effort normal.  No wheezes, rhonchi, rales Cardiovascular system: Distant heart sounds, regular rate and rhythm Gastrointestinal system: Abdomen is nondistended, soft and nontender. No organomegaly or masses felt. Normal bowel sounds heard. Central nervous system: Alert and oriented. No focal neurological deficits. Extremities: No lower extremity edema Skin: Port site is clean dry intact Psychiatry: Judgement and insight appear normal. Mood & affect appropriate.     Data Reviewed: I have personally reviewed following labs and imaging studies  CBC: Recent Labs  Lab 06/24/17 0428 06/25/17 0750 06/26/17 0520 06/27/17 0511 06/28/17 0604  WBC 4.8 7.2 8.9 12.4* 12.5*  NEUTROABS 3.6 5.9  --   --   --   HGB 9.7* 8.8* 7.7* 7.8* 8.1*  HCT 29.3* 26.9* 22.9* 24.1* 24.2*  MCV 99.0 98.2 98.7 100.4* 101.3*  PLT 188 229 214 272 865   Basic Metabolic Panel: Recent Labs  Lab 06/24/17 0428 06/25/17 0750 06/26/17 0520 06/27/17 0511 06/28/17 0604  NA 136 139 140 141 141  K 4.1 4.6 4.4 4.4 4.5  CL 97* 94* 101 106 103  CO2 27 32 32 30 31  GLUCOSE 101* 139* 137* 103* 127*  BUN 16 28* 24* 21* 23*  CREATININE 1.72* 2.14* 1.65* 1.26* 1.19  CALCIUM 9.3 9.3 9.0 8.9 9.0   GFR: Estimated  Creatinine Clearance: 51.4 mL/min (by C-G formula based on SCr of 1.19 mg/dL). Liver Function Tests: Recent Labs  Lab 06/25/17 0750 06/27/17 0511 06/28/17 0604  AST 44* 33 27  ALT 47 34 31  ALKPHOS 115 85 86  BILITOT 0.3 0.5 0.2*  PROT 6.8 5.6* 5.7*  ALBUMIN 3.3* 2.6* 2.7*   No results for input(s): LIPASE, AMYLASE in the last 168 hours. No results for input(s): AMMONIA in the last 168 hours. Coagulation Profile: No results for input(s): INR, PROTIME in the last 168 hours. Cardiac Enzymes: No results for input(s): CKTOTAL, CKMB, CKMBINDEX, TROPONINI in the last 168 hours. BNP (last 3 results) No results for input(s): PROBNP in the last 8760 hours. HbA1C: No results for input(s): HGBA1C in the last 72 hours. CBG: No results for input(s): GLUCAP in the last 168 hours. Lipid Profile: No results for input(s): CHOL, HDL, LDLCALC, TRIG, CHOLHDL, LDLDIRECT in the last 72 hours. Thyroid Function Tests: No results for input(s): TSH, T4TOTAL, FREET4, T3FREE, THYROIDAB in the last 72 hours. Anemia Panel: No results for input(s): VITAMINB12, FOLATE, FERRITIN, TIBC, IRON, RETICCTPCT in the last 72 hours. Sepsis Labs: No results for input(s): PROCALCITON, LATICACIDVEN in the last 168 hours.  Recent Results (from the past 240 hour(s))  Culture, blood (routine x 2)     Status: None   Collection Time: 06/21/17  8:05 AM  Result Value Ref Range Status   Specimen Description BLOOD PORTA CATH  Final   Special Requests   Final    BOTTLES DRAWN AEROBIC AND ANAEROBIC Blood Culture adequate volume Performed at [facility] [address], [place], [place] [address]    Culture   Final    NO GROWTH 5 DAYS Performed at [facility], [place] [address] [place], [place] [address]    Report Status 06/26/2017 FINAL  Final  Culture, blood (routine x 2)     Status: None   Collection Time: 06/21/17  8:10 AM  Result Value Ref Range Status   Specimen Description BLOOD LEFT  ANTECUBITAL  Final   Special Requests   Final    BOTTLES DRAWN AEROBIC AND ANAEROBIC Blood Culture adequate volume Performed at [facility] [address] [place], [place] [address]    Culture   Final    NO GROWTH 5 DAYS Performed at [facility], [name] [address] [place], [place] [address]    Report Status 06/26/2017 FINAL  Final         Radiology Studies: No results found.      Scheduled Meds: . escitalopram  5 mg Oral QHS  . feeding supplement (ENSURE ENLIVE)  237 mL Oral BID BM  . feeding supplement (PRO-STAT SUGAR FREE 64)  30 mL Oral BID  . guaiFENesin  600 mg Oral BID  . heparin  5,000 Units Subcutaneous Q8H  . ipratropium-albuterol  3 mL Nebulization TID  .  methylPREDNISolone (SOLU-MEDROL) injection  80 mg Intravenous Q8H  . metoprolol tartrate  12.5 mg Oral BID  . pantoprazole  40 mg Oral BID  . polyethylene glycol  17 g Oral BID  . senna-docusate  1 tablet Oral BID   Continuous Infusions: . azithromycin Stopped (06/28/17 1041)     LOS: 0 days    Time spent: Wittmann, MD [facility]   If 7PM-7AM, please contact night-coverage [email or web address] Password [facility] 06/28/2017, 11:55 AM

## 2017-06-29 LAB — CBC
HCT: 26.1 % — ABNORMAL LOW (ref 39.0–52.0)
Hemoglobin: 8.4 g/dL — ABNORMAL LOW (ref 13.0–17.0)
MCH: 32.7 pg (ref 26.0–34.0)
MCHC: 32.2 g/dL (ref 30.0–36.0)
MCV: 101.6 fL — ABNORMAL HIGH (ref 78.0–100.0)
Platelets: 322 10*3/uL (ref 150–400)
RBC: 2.57 MIL/uL — ABNORMAL LOW (ref 4.22–5.81)
RDW: 15.3 % (ref 11.5–15.5)
WBC: 15.8 K/uL — ABNORMAL HIGH (ref 4.0–10.5)

## 2017-06-29 MED ORDER — PREDNISONE 20 MG PO TABS: 40.0000 mg | ORAL_TABLET | Freq: Every day | ORAL | 0 refills | Status: DC

## 2017-06-29 MED ORDER — HEPARIN SOD (PORK) LOCK FLUSH 100 UNIT/ML IV SOLN
500.0000 [IU] | INTRAVENOUS | Status: AC | PRN
  Administered 2017-06-29: 500 [IU]
  Filled 2017-06-29: qty 5

## 2017-06-29 NOTE — Progress Notes (Signed)
Attempted to call report to [name].  The first three times I called I received a voicemail that was full and could not accept messages.  The fourth time that I called, the person that answer the phone stated that they were busy and unable to take report right now.  The nurse that will receive the patient is named Travonte.  Patient has been transported to facility via [facility].

## 2017-06-29 NOTE — NC FL2 (Signed)
[place] LEVEL OF CARE SCREENING TOOL     IDENTIFICATION  Patient Name: [name] Birthdate: July 08, 1960 Sex: male Admission Date (Current Location): 06/25/2017  [facility] and [place] Number:  Herbalist and Address:  [facility],  [place] [address] [place]      Provider Number: [id number]  Attending Physician Name and Address:  Cristy Folks, MD  Relative Name and Phone Number:       Current Level of Care: Hospital Recommended Level of Care: [place] Prior Approval Number:    Date Approved/Denied:   PASRR Number: [id number]  Discharge Plan: Domiciliary (Rest home)(ALF)    Current Diagnoses: Patient Active Problem List   Diagnosis Date Noted  . AKI (acute kidney injury) ([place]) 06/25/2017  . Dyspnea 06/21/2017  . Chronic combined systolic (congestive) and diastolic (congestive) heart failure ([place])   . Constipation 06/07/2017  . Pain   . Chronic respiratory failure with hypoxia ([place]) 04/01/2017  . Leucocytosis 03/16/2017  . CKD (chronic kidney disease), stage III ([place]) 03/12/2017  . Malnutrition of moderate degree 03/10/2017  . COPD exacerbation ([place]) 03/09/2017  . Anemia 02/07/2017  . Severe malnutrition ([place]) 01/03/2017  . DOE (dyspnea on exertion) 12/30/2016  . Nausea   . Hypervolemia   . HCAP (healthcare-associated pneumonia) 12/19/2016  . SOB (shortness of breath) 12/19/2016  . Erosive gastropathy 12/18/2016  . Gastritis 12/18/2016  . Hematemesis 12/16/2016  . Intractable nausea and vomiting 12/16/2016  . HTN (hypertension) 10/30/2016  . Chronic obstructive pulmonary disease ([place]) 09/02/2016  . COPD (chronic obstructive pulmonary disease) ([place]) 08/19/2016  . Dehydration 06/04/2016  . Abdominal pain 06/04/2016  . 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 Adenocarcinoma of left lung, stage 4 ([place]) 05/02/2016  . Goals of care, counseling/discussion 05/02/2016  . Encounter for antineoplastic  chemotherapy 05/02/2016  . Tobacco abuse 05/30/2011  . Alcohol abuse 05/30/2011  . Seizure ([place]) 05/28/2011    Orientation RESPIRATION BLADDER Height & Weight     Self, Time, Situation, Place  O2(nasal cannula 2.5L) Continent Weight: 117 lb 1 oz (53.1 kg) Height:  5\' 11"  (180.3 cm)  BEHAVIORAL SYMPTOMS/MOOD NEUROLOGICAL BOWEL NUTRITION STATUS      Continent Diet(please see DC summary)  AMBULATORY STATUS COMMUNICATION OF NEEDS Skin   (baseline) Verbally Normal                       Personal Care Assistance Level of Assistance  Bathing, Feeding, Dressing(baseline)           Functional Limitations Info  Sight, Hearing, Speech Sight Info: Adequate Hearing Info: Adequate Speech Info: Adequate    SPECIAL CARE FACTORS FREQUENCY                       Contractures Contractures Info: Not present    Additional Factors Info  Code Status, Allergies Code Status Info: Full Allergies Info: No Known Allergies           Current Medications (06/29/2017):  This is the current hospital active medication list Current Facility-Administered Medications  Medication Dose Route Frequency Provider Last Rate Last Dose  . albuterol (PROVENTIL) (2.5 MG/3ML) 0.083% nebulizer solution 2.5 mg  2.5 mg Nebulization Q4H PRN Shelly Coss, MD   2.5 mg at 06/26/17 2108  . azithromycin (ZITHROMAX) 500 mg in sodium chloride 0.9 % 250 mL IVPB  500 mg Intravenous Q24H Jani Gravel, MD   Stopped at 06/29/17 1030  . escitalopram (  LEXAPRO) tablet 5 mg  5 mg Oral QHS Shelly Coss, MD   5 mg at 06/28/17 2237  . feeding supplement (ENSURE ENLIVE) (ENSURE ENLIVE) liquid 237 mL  237 mL Oral BID BM Jani Gravel, MD   237 mL at 06/28/17 1449  . feeding supplement (PRO-STAT SUGAR FREE 64) liquid 30 mL  30 mL Oral BID Jani Gravel, MD   30 mL at 06/27/17 1452  . fluticasone furoate-vilanterol (BREO ELLIPTA) 100-25 MCG/INH 1 puff  1 puff Inhalation Daily Lenis Noon, RPH   1 puff at 06/29/17 0746  .  furosemide (LASIX) tablet 20 mg  20 mg Oral Daily Purohit, Shrey C, MD   20 mg at 06/28/17 1448  . guaiFENesin (MUCINEX) 12 hr tablet 600 mg  600 mg Oral BID Jani Gravel, MD   600 mg at 06/29/17 1109  . guaiFENesin-dextromethorphan (ROBITUSSIN DM) 100-10 MG/5ML syrup 5 mL  5 mL Oral Q4H PRN Purohit, Shrey C, MD   5 mL at 06/28/17 1721  . heparin injection 5,000 Units  5,000 Units Subcutaneous Q8H Adhikari, Amrit, MD      . ipratropium-albuterol (DUONEB) 0.5-2.5 (3) MG/3ML nebulizer solution 3 mL  3 mL Nebulization TID Purohit, Shrey C, MD   3 mL at 06/29/17 0742  . LORazepam (ATIVAN) injection 0.5 mg  0.5 mg Intravenous Q6H PRN Jani Gravel, MD      . methylPREDNISolone sodium succinate (SOLU-MEDROL) 40 mg/mL injection 40 mg  40 mg Intravenous Q8H Purohit, Shrey C, MD   40 mg at 06/29/17 0646  . metoprolol tartrate (LOPRESSOR) tablet 12.5 mg  12.5 mg Oral BID Shelly Coss, MD   12.5 mg at 06/29/17 1110  . ondansetron (ZOFRAN) injection 4 mg  4 mg Intravenous Q6H PRN Jani Gravel, MD   4 mg at 06/29/17 0142  . oxyCODONE (Oxy IR/ROXICODONE) immediate release tablet 10 mg  10 mg Oral Q4H PRN Shelly Coss, MD   10 mg at 06/29/17 0142  . pantoprazole (PROTONIX) EC tablet 40 mg  40 mg Oral BID Shelly Coss, MD   40 mg at 06/29/17 1109  . polyethylene glycol (MIRALAX / GLYCOLAX) packet 17 g  17 g Oral BID Shelly Coss, MD   17 g at 06/27/17 2205  . senna-docusate (Senokot-S) tablet 1 tablet  1 tablet Oral BID Shelly Coss, MD   1 tablet at 06/28/17 5732     Discharge Medications: Please see discharge summary for a list of discharge medications.  Relevant Imaging Results:  Relevant Lab Results:   Additional Information SSN: [id number]  Estanislado Emms, LCSW
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
## 2017-06-29 NOTE — Progress Notes (Signed)
Patient will discharge back to [name]. Anticipated discharge date: 06/29/17 Transportation by: [facility]  Nurse to call report to [phone].   CSW signing off.  Estanislado Emms, [place]  Clinical Social Worker

## 2017-06-29 NOTE — Discharge Instructions (Signed)
If you wish to quit smoking, help is available.  For free tobacco cessation program offerings call the [facility] at [phone] or Live Well Line at [phone])875-6264. You may also visit [email or web address] or email [email or web address]  for more information on other programs.

## 2017-06-30 ENCOUNTER — Encounter (HOSPITAL_COMMUNITY): Payer: Self-pay

## 2017-06-30 ENCOUNTER — Emergency Department (HOSPITAL_COMMUNITY): Payer: Medicaid Other

## 2017-06-30 ENCOUNTER — Inpatient Hospital Stay: Payer: Medicaid Other

## 2017-06-30 ENCOUNTER — Inpatient Hospital Stay: Payer: Medicaid Other | Admitting: Nurse Practitioner

## 2017-06-30 ENCOUNTER — Other Ambulatory Visit (HOSPITAL_COMMUNITY): Payer: Medicaid Other

## 2017-06-30 ENCOUNTER — Other Ambulatory Visit: Payer: Self-pay

## 2017-06-30 ENCOUNTER — Inpatient Hospital Stay (HOSPITAL_COMMUNITY)
Admission: EM | Admit: 2017-06-30 | Discharge: 2017-07-03 | DRG: 176 | Disposition: A | Discharge status: Home Health | Payer: Medicaid Other | Attending: Internal Medicine | Admitting: Internal Medicine

## 2017-06-30 DIAGNOSIS — D631 Anemia in chronic kidney disease: Secondary | ICD-10-CM

## 2017-06-30 DIAGNOSIS — N179 Acute kidney failure, unspecified: Secondary | ICD-10-CM | POA: Diagnosis present

## 2017-06-30 DIAGNOSIS — N189 Chronic kidney disease, unspecified: Secondary | ICD-10-CM

## 2017-06-30 DIAGNOSIS — Z9981 Dependence on supplemental oxygen: Secondary | ICD-10-CM | POA: Diagnosis not present

## 2017-06-30 DIAGNOSIS — D72829 Elevated white blood cell count, unspecified: Secondary | ICD-10-CM | POA: Diagnosis present

## 2017-06-30 DIAGNOSIS — I361 Nonrheumatic tricuspid (valve) insufficiency: Secondary | ICD-10-CM | POA: Diagnosis not present

## 2017-06-30 DIAGNOSIS — R0602 Shortness of breath: Secondary | ICD-10-CM

## 2017-06-30 DIAGNOSIS — Z9221 Personal history of antineoplastic chemotherapy: Secondary | ICD-10-CM

## 2017-06-30 DIAGNOSIS — E46 Unspecified protein-calorie malnutrition: Secondary | ICD-10-CM | POA: Diagnosis present

## 2017-06-30 DIAGNOSIS — I248 Other forms of acute ischemic heart disease: Secondary | ICD-10-CM | POA: Diagnosis present

## 2017-06-30 DIAGNOSIS — R188 Other ascites: Secondary | ICD-10-CM | POA: Diagnosis present

## 2017-06-30 DIAGNOSIS — J9611 Chronic respiratory failure with hypoxia: Secondary | ICD-10-CM | POA: Diagnosis present

## 2017-06-30 DIAGNOSIS — E876 Hypokalemia: Secondary | ICD-10-CM | POA: Diagnosis present

## 2017-06-30 DIAGNOSIS — F17210 Nicotine dependence, cigarettes, uncomplicated: Secondary | ICD-10-CM | POA: Diagnosis present

## 2017-06-30 DIAGNOSIS — I130 Hypertensive heart and chronic kidney disease with heart failure and stage 1 through stage 4 chronic kidney disease, or unspecified chronic kidney disease: Secondary | ICD-10-CM | POA: Diagnosis present

## 2017-06-30 DIAGNOSIS — J439 Emphysema, unspecified: Secondary | ICD-10-CM | POA: Diagnosis present

## 2017-06-30 DIAGNOSIS — I2699 Other pulmonary embolism without acute cor pulmonale: Secondary | ICD-10-CM

## 2017-06-30 DIAGNOSIS — I5032 Chronic diastolic (congestive) heart failure: Secondary | ICD-10-CM | POA: Diagnosis present

## 2017-06-30 DIAGNOSIS — C3492 Malignant neoplasm of unspecified part of left bronchus or lung: Secondary | ICD-10-CM | POA: Diagnosis present

## 2017-06-30 DIAGNOSIS — J449 Chronic obstructive pulmonary disease, unspecified: Secondary | ICD-10-CM | POA: Diagnosis present

## 2017-06-30 DIAGNOSIS — Z66 Do not resuscitate: Secondary | ICD-10-CM | POA: Diagnosis present

## 2017-06-30 DIAGNOSIS — Z515 Encounter for palliative care: Secondary | ICD-10-CM | POA: Diagnosis not present

## 2017-06-30 DIAGNOSIS — Z9114 Patient's other noncompliance with medication regimen: Secondary | ICD-10-CM

## 2017-06-30 DIAGNOSIS — R627 Adult failure to thrive: Secondary | ICD-10-CM | POA: Diagnosis present

## 2017-06-30 DIAGNOSIS — C3412 Malignant neoplasm of upper lobe, left bronchus or lung: Secondary | ICD-10-CM | POA: Diagnosis present

## 2017-06-30 DIAGNOSIS — Z7189 Other specified counseling: Secondary | ICD-10-CM | POA: Diagnosis not present

## 2017-06-30 DIAGNOSIS — R1084 Generalized abdominal pain: Secondary | ICD-10-CM | POA: Diagnosis not present

## 2017-06-30 DIAGNOSIS — Z833 Family history of diabetes mellitus: Secondary | ICD-10-CM

## 2017-06-30 DIAGNOSIS — J441 Chronic obstructive pulmonary disease with (acute) exacerbation: Secondary | ICD-10-CM

## 2017-06-30 DIAGNOSIS — R000 Tachycardia, unspecified: Secondary | ICD-10-CM | POA: Diagnosis not present

## 2017-06-30 DIAGNOSIS — Z923 Personal history of irradiation: Secondary | ICD-10-CM

## 2017-06-30 DIAGNOSIS — K5900 Constipation, unspecified: Secondary | ICD-10-CM | POA: Diagnosis present

## 2017-06-30 DIAGNOSIS — Z681 Body mass index (BMI) 19 or less, adult: Secondary | ICD-10-CM

## 2017-06-30 DIAGNOSIS — J90 Pleural effusion, not elsewhere classified: Secondary | ICD-10-CM

## 2017-06-30 DIAGNOSIS — N183 Chronic kidney disease, stage 3 (moderate): Secondary | ICD-10-CM | POA: Diagnosis present

## 2017-06-30 DIAGNOSIS — C7951 Secondary malignant neoplasm of bone: Secondary | ICD-10-CM | POA: Diagnosis present

## 2017-06-30 DIAGNOSIS — Z720 Tobacco use: Secondary | ICD-10-CM | POA: Diagnosis present

## 2017-06-30 HISTORY — DX: Other pulmonary embolism without acute cor pulmonale: I26.99

## 2017-06-30 LAB — BASIC METABOLIC PANEL
ANION GAP: 8 (ref 5–15)
BUN: 29 mg/dL — ABNORMAL HIGH (ref 6–20)
CALCIUM: 9.4 mg/dL (ref 8.9–10.3)
CO2: 33 mmol/L — ABNORMAL HIGH (ref 22–32)
Chloride: 101 mmol/L (ref 101–111)
Creatinine, Ser: 1.4 mg/dL — ABNORMAL HIGH (ref 0.61–1.24)
GFR, EST NON AFRICAN AMERICAN: 54 mL/min — AB (ref >60)
GLUCOSE: 143 mg/dL — AB (ref 65–99)
POTASSIUM: 4.1 mmol/L (ref 3.5–5.1)
Sodium: 142 mmol/L (ref 135–145)

## 2017-06-30 LAB — BRAIN NATRIURETIC PEPTIDE: B NATRIURETIC PEPTIDE 5: 224.5 pg/mL — AB (ref 0.0–100.0)

## 2017-06-30 LAB — I-STAT TROPONIN, ED
TROPONIN I, POC: 0.12 ng/mL — AB (ref 0.00–0.08)
Troponin i, poc: 0.05 ng/mL (ref 0.00–0.08)

## 2017-06-30 LAB — CBC WITH DIFFERENTIAL/PLATELET
BASOS ABS: 0 10*3/uL (ref 0.0–0.1)
Basophils Relative: 0 %
Eosinophils Absolute: 0 10*3/uL (ref 0.0–0.7)
Eosinophils Relative: 0 %
HCT: 28.3 % — ABNORMAL LOW (ref 39.0–52.0)
Hemoglobin: 9.1 g/dL — ABNORMAL LOW (ref 13.0–17.0)
LYMPHS PCT: 2 %
Lymphs Abs: 0.4 10*3/uL — ABNORMAL LOW (ref 0.7–4.0)
MCH: 32.9 pg (ref 26.0–34.0)
MCHC: 32.2 g/dL (ref 30.0–36.0)
MCV: 102.2 fL — AB (ref 78.0–100.0)
MONOS PCT: 10 %
MYELOCYTES: 5 %
Metamyelocytes Relative: 2 %
Monocytes Absolute: 1.8 10*3/uL — ABNORMAL HIGH (ref 0.1–1.0)
Neutro Abs: 15.9 10*3/uL — ABNORMAL HIGH (ref 1.7–7.7)
Neutrophils Relative %: 81 %
PLATELETS: 311 10*3/uL (ref 150–400)
RBC: 2.77 MIL/uL — AB (ref 4.22–5.81)
RDW: 15.5 % (ref 11.5–15.5)
WBC: 18.1 10*3/uL — AB (ref 4.0–10.5)

## 2017-06-30 LAB — PROTIME-INR
INR: 1.01
PROTHROMBIN TIME: 13.2 s (ref 11.4–15.2)

## 2017-06-30 LAB — TROPONIN I
TROPONIN I: 0.23 ng/mL — AB (ref <0.03)
Troponin I: 0.28 ng/mL (ref <0.03)

## 2017-06-30 LAB — MRSA PCR SCREENING: MRSA by PCR: NEGATIVE

## 2017-06-30 LAB — HEPARIN LEVEL (UNFRACTIONATED): Heparin Unfractionated: 0.89 IU/mL — ABNORMAL HIGH (ref 0.30–0.70)

## 2017-06-30 LAB — APTT: aPTT: 29 seconds (ref 24–36)

## 2017-06-30 MED ORDER — ONDANSETRON 4 MG PO TBDP
4.0000 mg | ORAL_TABLET | Freq: Three times a day (TID) | ORAL | Status: DC | PRN
  Administered 2017-06-30 – 2017-07-03 (×4): 4 mg via ORAL
  Filled 2017-06-30 (×5): qty 1

## 2017-06-30 MED ORDER — FUROSEMIDE 40 MG PO TABS
20.0000 mg | ORAL_TABLET | Freq: Every day | ORAL | Status: DC
  Administered 2017-07-01: 20 mg via ORAL
  Filled 2017-06-30 (×4): qty 1

## 2017-06-30 MED ORDER — HEPARIN (PORCINE) IN NACL 100-0.45 UNIT/ML-% IJ SOLN
1000.0000 [IU]/h | INTRAMUSCULAR | Status: DC
  Administered 2017-06-30: 1000 [IU]/h via INTRAVENOUS
  Filled 2017-06-30: qty 250

## 2017-06-30 MED ORDER — OXYCODONE HCL 5 MG PO TABS
10.0000 mg | ORAL_TABLET | ORAL | Status: DC | PRN
  Administered 2017-06-30 – 2017-07-01 (×3): 10 mg via ORAL
  Filled 2017-06-30 (×3): qty 2

## 2017-06-30 MED ORDER — FLUCONAZOLE 100 MG PO TABS
100.0000 mg | ORAL_TABLET | Freq: Every day | ORAL | Status: DC
  Administered 2017-06-30 – 2017-07-01 (×2): 100 mg via ORAL
  Filled 2017-06-30 (×4): qty 1

## 2017-06-30 MED ORDER — ESCITALOPRAM OXALATE 10 MG PO TABS
5.0000 mg | ORAL_TABLET | Freq: Every day | ORAL | Status: DC
  Administered 2017-06-30 – 2017-07-03 (×4): 5 mg via ORAL
  Filled 2017-06-30 (×4): qty 1

## 2017-06-30 MED ORDER — GUAIFENESIN ER 600 MG PO TB12
1200.0000 mg | ORAL_TABLET | Freq: Two times a day (BID) | ORAL | Status: DC
  Administered 2017-06-30 – 2017-07-03 (×6): 1200 mg via ORAL
  Filled 2017-06-30 (×8): qty 2

## 2017-06-30 MED ORDER — AMLODIPINE BESYLATE 5 MG PO TABS
10.0000 mg | ORAL_TABLET | Freq: Every day | ORAL | Status: DC
  Administered 2017-06-30 – 2017-07-02 (×3): 10 mg via ORAL
  Filled 2017-06-30 (×3): qty 2

## 2017-06-30 MED ORDER — IOPAMIDOL (ISOVUE-370) INJECTION 76%
INTRAVENOUS | Status: AC
  Filled 2017-06-30: qty 100

## 2017-06-30 MED ORDER — IPRATROPIUM-ALBUTEROL 0.5-2.5 (3) MG/3ML IN SOLN
3.0000 mL | Freq: Four times a day (QID) | RESPIRATORY_TRACT | Status: DC
  Administered 2017-06-30 – 2017-07-03 (×14): 3 mL via RESPIRATORY_TRACT
  Filled 2017-06-30 (×14): qty 3

## 2017-06-30 MED ORDER — FLUTICASONE-UMECLIDIN-VILANT 100-62.5-25 MCG/INH IN AEPB: 1.0000 | INHALATION_SPRAY | Freq: Every day | RESPIRATORY_TRACT | Status: DC

## 2017-06-30 MED ORDER — POLYETHYLENE GLYCOL 3350 17 G PO PACK
17.0000 g | PACK | Freq: Two times a day (BID) | ORAL | Status: DC
  Administered 2017-07-02: 17 g via ORAL
  Filled 2017-06-30 (×6): qty 1

## 2017-06-30 MED ORDER — METOPROLOL TARTRATE 25 MG PO TABS
12.5000 mg | ORAL_TABLET | Freq: Two times a day (BID) | ORAL | Status: DC
  Administered 2017-06-30 – 2017-07-03 (×6): 12.5 mg via ORAL
  Filled 2017-06-30 (×8): qty 1

## 2017-06-30 MED ORDER — METHYLPREDNISOLONE SODIUM SUCC 125 MG IJ SOLR
125.0000 mg | Freq: Once | INTRAMUSCULAR | Status: AC
Start: 2017-06-30 — End: 2017-06-30
  Administered 2017-06-30: 125 mg via INTRAVENOUS
  Filled 2017-06-30: qty 2

## 2017-06-30 MED ORDER — IOPAMIDOL (ISOVUE-370) INJECTION 76%
100.0000 mL | Freq: Once | INTRAVENOUS | Status: AC | PRN
  Administered 2017-06-30: 100 mL via INTRAVENOUS

## 2017-06-30 MED ORDER — IPRATROPIUM BROMIDE 0.02 % IN SOLN
0.5000 mg | Freq: Once | RESPIRATORY_TRACT | Status: AC
Start: 2017-06-30 — End: 2017-06-30
  Administered 2017-06-30: 0.5 mg via RESPIRATORY_TRACT
  Filled 2017-06-30: qty 2.5

## 2017-06-30 MED ORDER — PANTOPRAZOLE SODIUM 40 MG PO TBEC
40.0000 mg | DELAYED_RELEASE_TABLET | Freq: Two times a day (BID) | ORAL | Status: DC
  Administered 2017-06-30 – 2017-07-03 (×6): 40 mg via ORAL
  Filled 2017-06-30 (×8): qty 1

## 2017-06-30 MED ORDER — HEPARIN (PORCINE) IN NACL 100-0.45 UNIT/ML-% IJ SOLN
750.0000 [IU]/h | INTRAMUSCULAR | Status: DC
  Administered 2017-06-30: 750 [IU]/h via INTRAVENOUS

## 2017-06-30 MED ORDER — SENNOSIDES-DOCUSATE SODIUM 8.6-50 MG PO TABS
1.0000 | ORAL_TABLET | Freq: Every day | ORAL | Status: DC
  Filled 2017-06-30 (×2): qty 1

## 2017-06-30 MED ORDER — FUROSEMIDE 10 MG/ML IJ SOLN
40.0000 mg | Freq: Once | INTRAMUSCULAR | Status: AC
  Administered 2017-06-30: 40 mg via INTRAVENOUS
  Filled 2017-06-30: qty 4

## 2017-06-30 MED ORDER — ALBUTEROL SULFATE (2.5 MG/3ML) 0.083% IN NEBU
5.0000 mg | INHALATION_SOLUTION | Freq: Once | RESPIRATORY_TRACT | Status: AC
Start: 2017-06-30 — End: 2017-06-30
  Administered 2017-06-30: 5 mg via RESPIRATORY_TRACT
  Filled 2017-06-30: qty 6

## 2017-06-30 MED ORDER — HEPARIN BOLUS VIA INFUSION
4000.0000 [IU] | Freq: Once | INTRAVENOUS | Status: AC
  Administered 2017-06-30: 4000 [IU] via INTRAVENOUS
  Filled 2017-06-30: qty 4000

## 2017-06-30 NOTE — ED Notes (Signed)
Bed: WA21 Expected date:  Expected time:  Means of arrival:  Comments: Ems sob

## 2017-06-30 NOTE — Progress Notes (Signed)
CRITICAL VALUE ALERT  Critical Value:  TROP 0.26  Date & Time Notied:  06/30/17  Provider Notified: Tawanna Solo  Orders Received/Actions taken:

## 2017-06-30 NOTE — ED Triage Notes (Signed)
Pt brought by EMS for shortness of breath x3 hrs. Pt has stage IV lung cancer.   Pt received 10mg  of albuterol on EMS 0.5mg  atrovent on EMS  8L O2 on EMS truck at 100%

## 2017-06-30 NOTE — ED Provider Notes (Signed)
[facility] DEPT Provider Note   CSN: [id number] Arrival date & time: 06/30/17  0245     History   Chief Complaint Chief Complaint  Patient presents with  . Shortness of Breath    HPI [name] is a 57 y.o. 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  Patient reports the nursing home gave him one breathing treatment which did not help.  Patient reports he then developed chest pain around 7 PM last night.  He reports it is worse with inspiration.  He denies fevers, chills, diaphoresis, nausea, vomiting, diarrhea, dizziness, syncope.  Patient reports he does continue to smoke.  Patient was given 1 additional breathing treatment by EMS in route.  Patient reports that movement makes his symptoms worse.  Nothing seems to make them better.  He uses oxygen at home as needed.  He reports he uses it most of the time at 2 L via nasal cannula.  The history is provided by the patient and medical records. No language interpreter was used.    Past Medical History:  Diagnosis Date  . Abdominal pain 06/04/2016  . Adenocarcinoma of left lung, stage 4 ([place]) 05/02/2016  . Alcohol abuse   . Bronchitis due to tobacco use ([place])   . Cancer ([place])    Lung  . COPD (chronic obstructive pulmonary disease) ([place])   . Dehydration 06/04/2016  . Encounter for antineoplastic chemotherapy 05/02/2016  . Gastritis   . Goals of care, counseling/discussion 05/02/2016  . Hematemesis   . HTN (hypertension) 10/30/2016  . Recurrent upper respiratory infection (URI)   . Seizures ([place]) 05/2011   new onset  . Shortness of breath     Patient Active Problem List   Diagnosis Date Noted  . AKI  (acute kidney injury) ([place]) 06/25/2017  . Dyspnea 06/21/2017  . Chronic combined systolic (congestive) and diastolic (congestive) heart failure ([place])   . Constipation 06/07/2017  . Pain   . Chronic respiratory failure with hypoxia ([place]) 04/01/2017  . Leucocytosis 03/16/2017  . CKD (chronic kidney disease), stage III ([place]) 03/12/2017  . Malnutrition of moderate degree 03/10/2017  . COPD exacerbation ([place]) 03/09/2017  . Anemia 02/07/2017  . Severe malnutrition ([place]) 01/03/2017  . DOE (dyspnea on exertion) 12/30/2016  . Nausea   . Hypervolemia   . HCAP (healthcare-associated pneumonia) 12/19/2016  . SOB (shortness of breath) 12/19/2016  . Erosive gastropathy 12/18/2016  . Gastritis 12/18/2016  . Hematemesis 12/16/2016  . Intractable nausea and vomiting 12/16/2016  . HTN (hypertension) 10/30/2016  . Chronic obstructive pulmonary disease ([place]) 09/02/2016  . COPD (chronic obstructive pulmonary disease) ([place]) 08/19/2016  . Dehydration 06/04/2016  . Abdominal pain 06/04/2016  . 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 Adenocarcinoma of left lung, stage 4 ([place]) 05/02/2016  . Goals of care, counseling/discussion 05/02/2016  . Encounter for antineoplastic chemotherapy 05/02/2016  . Tobacco abuse 05/30/2011  . Alcohol abuse 05/30/2011  . Seizure ([place]) 05/28/2011    Past Surgical History:  Procedure Laterality Date  . ESOPHAGOGASTRODUODENOSCOPY (EGD) WITH PROPOFOL N/A 12/17/2016   Procedure: ESOPHAGOGASTRODUODENOSCOPY (EGD) WITH PROPOFOL;  Surgeon: Ladene Artist, MD;  Location: [facility] ENDOSCOPY;  Service: Endoscopy;  Laterality: N/A;  . IR FLUORO GUIDE PORT INSERTION RIGHT  05/13/2016  . IR US  GUIDE VASC ACCESS RIGHT  05/13/2016  . NO PAST SURGERIES          Home Medications    Prior to Admission medications   Medication Sig Start Date End Date Taking? Authorizing Provider  albuterol (PROVENTIL) (2.5 MG/3ML) 0.083% nebulizer solution 1 neb every 4-6 hours as needed for wheezing and  shortness of breath Patient taking differently: Take 2.5 mg by nebulization every 4 (four) hours as needed for wheezing or shortness of breath.  04/01/17  Yes Parrett, Tammy S, NP  amLODipine (NORVASC) 10 MG tablet Take 10 mg by mouth daily.   Yes [provider]  dexamethasone (DECADRON) 4 MG tablet Take 4 mg by mouth 2 (two) times daily. The day before, the day of, and the day after chemotherapy every three weeks   Yes [provider]  escitalopram (LEXAPRO) 5 MG tablet Take 1 tablet (5 mg total) by mouth at bedtime. 04/17/17  Yes Rosita Fire, MD  feeding supplement, ENSURE ENLIVE, (ENSURE ENLIVE) LIQD Take 237 mLs by mouth 3 (three) times daily between meals. 03/19/17  Yes Shelly Coss, MD  fluconazole (DIFLUCAN) 100 MG tablet Take 1 tablet (100 mg total) by mouth daily. Patient taking differently: Take 100 mg by mouth daily. Continuous 03/20/17  Yes Adhikari, Tamsen Meek, MD  Fluticasone-Umeclidin-Vilant (TRELEGY ELLIPTA) 100-62.5-25 MCG/INH AEPB Inhale 1 puff into the lungs daily. 05/31/17  Yes Aline August, MD  furosemide (LASIX) 20 MG tablet Take 1 tablet (20 mg total) by mouth daily. 06/10/17  Yes Eugenie Filler, MD  guaiFENesin (MUCINEX) 600 MG 12 hr tablet Take 1,200 mg by mouth 2 (two) times daily.    Yes [provider]  guaiFENesin-dextromethorphan (ROBITUSSIN DM) 100-10 MG/5ML syrup Take 5 mLs by mouth every 4 (four) hours as needed for cough. 05/31/17  Yes Aline August, MD  Ipratropium-Albuterol (COMBIVENT) 20-100 MCG/ACT AERS respimat Inhale 1 puff into the lungs every 6 (six) hours. 05/31/17  Yes Aline August, MD  loratadine (CLARITIN) 10 MG tablet Take 10 mg by mouth daily.   Yes [provider]  magnesium hydroxide (MILK OF MAGNESIA) 400 MG/5ML suspension Take 30 mLs by mouth daily. 06/11/17  Yes Eugenie Filler, MD  Multiple Vitamin (MULTIVITAMIN WITH MINERALS) TABS tablet Take 1 tablet by mouth daily. 06/23/17  Yes Mikhail, Maryann, DO    ondansetron (ZOFRAN-ODT) 4 MG disintegrating tablet Take 4 mg by mouth every 8 (eight) hours as needed for nausea or vomiting.   Yes [provider]  Oxycodone HCl 10 MG TABS Take 10 mg by mouth every 4 (four) hours as needed (pain/shortness of breath).    Yes [provider]  pantoprazole (PROTONIX) 40 MG tablet Take 1 tablet (40 mg total) by mouth 2 (two) times daily. 12/18/16  Yes Eugenie Filler, MD  polyethylene glycol [facility] / Floria Raveling) packet Take 17 g by mouth 2 (two) times daily. 06/10/17  Yes Eugenie Filler, MD  predniSONE (DELTASONE) 20 MG tablet Take 2 tablets (40 mg total) by mouth daily for 6 days. 06/29/17 07/05/17 Yes Purohit, Konrad Dolores, MD  senna-docusate (SENNA-PLUS) 8.6-50 MG tablet Take 1 tablet by mouth 2 (two) times daily. 06/10/17  Yes Eugenie Filler, MD  acetaminophen (TYLENOL) 500 MG tablet Take 1,000 mg by mouth 2 (two) times daily as needed for mild pain.    [provider]  Chlorphen-Phenyleph-ASA (ALKA-SELTZER PLUS COLD PO) Take 2 tablets by mouth at bedtime as needed (COUGH).    [provider]  metoprolol tartrate (  LOPRESSOR) 25 MG tablet Take 12.5 mg by mouth 2 (two) times daily.     [provider]    Family History Family History  Problem Relation Age of Onset  . Cancer Father   . Diabetes Mellitus II Mother     Social History Social History   Tobacco Use  . Smoking status: Current Some Day Smoker    Packs/day: 0.10    Years: 30.00    Pack years: 3.00    Types: Cigarettes  . Smokeless tobacco: Never Used  . Tobacco comment: 1-2 cig a day   Substance Use Topics  . Alcohol use: No    Frequency: Never  . Drug use: No     Allergies   Patient has no known allergies.   Review of Systems Review of Systems  Constitutional: Negative for appetite change, diaphoresis, fatigue, fever and unexpected weight change.  HENT: Negative for mouth sores.   Eyes: Negative for visual disturbance.   Respiratory: Positive for shortness of breath. Negative for cough, chest tightness and wheezing.   Cardiovascular: Positive for chest pain.  Gastrointestinal: Negative for abdominal pain, constipation, diarrhea, nausea and vomiting.  Endocrine: Negative for polydipsia, polyphagia and polyuria.  Genitourinary: Negative for dysuria, frequency, hematuria and urgency.  Musculoskeletal: Negative for back pain and neck stiffness.  Skin: Negative for rash.  Allergic/Immunologic: Negative for immunocompromised state.  Neurological: Negative for syncope, light-headedness and headaches.  Hematological: Does not bruise/bleed easily.  Psychiatric/Behavioral: Negative for sleep disturbance. The patient is not nervous/anxious.      Physical Exam Updated Vital Signs BP (!) 132/99 (BP Location: Right Arm)   Pulse (!) 111   Temp 97.8 F (36.6 C) (Oral)   Resp 16   Ht 5\' 11"  (1.803 m)   Wt 53.1 kg (117 lb)   SpO2 100%   BMI 16.32 kg/m   Physical Exam  Constitutional: He appears well-developed and well-nourished. No distress.  Awake, alert, nontoxic appearance  HENT:  Head: Normocephalic and atraumatic.  Mouth/Throat: Oropharynx is clear and moist. No oropharyngeal exudate.  Eyes: Conjunctivae are normal. No scleral icterus.  Neck: Normal range of motion. Neck supple.  Cardiovascular: Regular rhythm and intact distal pulses. Tachycardia present.  Pulses:      Radial pulses are 2+ on the right side, and 2+ on the left side.  Pulmonary/Chest: Accessory muscle usage present. Tachypnea noted. No respiratory distress. He has decreased breath sounds. He has wheezes ( Faint, expiratory).  Equal chest expansion  Abdominal: Soft. Bowel sounds are normal. He exhibits no mass. There is no tenderness. There is no rebound and no guarding.  Musculoskeletal: Normal range of motion. He exhibits no edema.  Neurological: He is alert.  Speech is clear and goal oriented Moves extremities without ataxia   Skin: Skin is warm and dry. He is not diaphoretic.  Psychiatric: He has a normal mood and affect.  Nursing note and vitals reviewed.    ED Treatments / Results  Labs (all labs ordered are listed, but only abnormal results are displayed) Labs Reviewed  CBC WITH DIFFERENTIAL/PLATELET - Abnormal; Notable for the following components:      Result Value   WBC 18.1 (*)    RBC 2.77 (*)    Hemoglobin 9.1 (*)    HCT 28.3 (*)    MCV 102.2 (*)    All other components within normal limits  BASIC METABOLIC PANEL - Abnormal; Notable for the following components:   CO2 33 (*)    Glucose, Bld 143 (*)  BUN 29 (*)    Creatinine, Ser 1.40 (*)    GFR calc non Af Amer 54 (*)    All other components within normal limits  BRAIN NATRIURETIC PEPTIDE  I-STAT TROPONIN, ED   ED ECG REPORT   Date: 06/30/2017  Rate: 74  Rhythm: normal sinus rhythm  QRS Axis: normal  Intervals: normal  ST/T Wave abnormalities: nonspecific T wave changes  Conduction Disutrbances:none  Narrative Interpretation: Sinus rhythm with baseline wander  Old EKG Reviewed: unchanged  I have personally reviewed the EKG tracing and disagree with the computerized printout as noted.  Computerized pronounced states ST elevation, consider inferior injury however this appears to be more baseline wander and is unchanged from previous.   Radiology Dg Chest 2 View  Result Date: 06/30/2017 CLINICAL DATA:  Initial evaluation for acute shortness of breath. History of lung cancer. EXAM: CHEST - 2 VIEW COMPARISON:  Prior radiograph from 06/25/2017. FINDINGS: Right-sided Port-A-Cath in place with tip overlying the proximal right atrium. Cardiac and mediastinal silhouettes stable in size and contour, and remain within normal limits. Lungs normally inflated. Small moderate right pleural effusion is increased from previous. Scattered vascular congestion with crowding throughout the right lung, progressed from previous. Probable superimposed  right basilar atelectasis. Blunting of the left costophrenic angle, which could reflect effusion and/or chronic pleural reaction/scarring, similar to previous. Left lung otherwise largely clear. Left perihilar architectural distortion with possible spiculated nodule, similar to previous. No pneumothorax. No acute osseous abnormality. IMPRESSION: 1. Small to moderate layering right pleural effusion, increased from previous, with worsened vascular congestion/crowding and probable atelectatic changes within the right lung. 2. Blunting of the left costophrenic angle, which could reflect small effusion and/or chronic pleural reaction/scarring, similar to previous. 3. Left perihilar architectural distortion with underlying spiculated nodular density, consistent with known lung malignancy. Electronically Signed   By: Jeannine Boga M.D.   On: 06/30/2017 04:36    Procedures Procedures (including critical care time)  Medications Ordered in ED Medications  albuterol (PROVENTIL) (2.5 MG/3ML) 0.083% nebulizer solution 5 mg (5 mg Nebulization Given 06/30/17 0343)  ipratropium (ATROVENT) nebulizer solution 0.5 mg (0.5 mg Nebulization Given 06/30/17 0343)  methylPREDNISolone sodium succinate (SOLU-MEDROL) 125 mg/2 mL injection 125 mg (125 mg Intravenous Given 06/30/17 0500)     Initial Impression / Assessment and Plan / ED Course  I have reviewed the triage vital signs and the nursing notes.  Pertinent labs & imaging results that were available during my care of the patient were reviewed by me and considered in my medical decision making (see chart for details).  Clinical Course as of Jul 01 630  Mon Jun 30, 2017  0625 Tachycardia  Pulse Rate(!): 111 [HM]  0625 Afebrile  Temp: 97.8 F (36.6 C) [HM]  0625 Tachypneic  Resp(!): 22 [HM]  0625 White blood cell count elevated from 24 hours ago  WBC(!): 18.1 [HM]  0626 Hemoglobin improved  Hemoglobin(!): 9.1 [HM]  0626 Creatinine elevated from 24 hours  ago when it was 1.1  Creatinine(!): 1.40 [HM]  0626 Negative  Troponin i, poc: 0.05 [HM]    Clinical Course User Index [HM] Sebastien Jackson, Jarrett Soho, PA-C    Patient presents with complaints of shortness of breath.  Patient given albuterol, Atrovent and Solu-Medrol.  Some improvement in his shortness of breath.  Patient is found to be tachycardic today.  On record review, patient was discharged yesterday with a heart rate of 60.  On previous emergency department visits he has not regularly been tachycardic.  Patient is afebrile.  Less likely to be pneumonia.  Chest x-ray does show enlarging right pleural effusion however no significant change.  I did personally evaluate left images and compared them to his last x-ray several days ago.  Patient has no history of congestive heart failure and his last echocardiogram on 03/18/2017 showed EF of 55-60%.  Patient reports he is using his home oxygen more and more.  Record shows that patient was discharged with home oxygen at 2 L via nasal cannula however patient has been increased to 4 L here.  His respiratory distress has improved after treatment.  He continues to wheeze, however I suspect this is his baseline.  Patient pending a BNP and CT angiogram chest to rule out pulmonary embolism.  Patient is high risk as he is a smoker and does have cancer.  Patient will need delta troponin as he has had chest pain today.  EKG was without STEMI.  The patient was discussed with and seen by Dr. Ellender Hose who agrees with the treatment plan.  At shift change care was transferred to Geanie Kenning who will follow pending studies, re-evaulate and determine disposition.      Final Clinical Impressions(s) / ED Diagnoses   Final diagnoses:  COPD exacerbation ([place])  SOB (shortness of breath)  Tachycardia  Anemia due to chronic kidney disease, unspecified CKD stage  Pleural effusion    ED Discharge Orders    None       Loni Muse Gwenlyn Perking 06/30/17 [id number]     Duffy Bruce, MD 06/30/17 1556

## 2017-06-30 NOTE — Progress Notes (Signed)
ANTICOAGULATION CONSULT NOTE - Initial Consult  Pharmacy Consult for Heparin  Indication: pulmonary embolus  No Known Allergies  Patient Measurements: Height: 5\' 11"  (180.3 cm) Weight: 117 lb (53.1 kg) IBW/kg (Calculated) : 75.3 Heparin Dosing Weight: actual weight  Vital Signs: Temp: 97.8 F (36.6 C) (06/10 0308) Temp Source: Oral (06/10 0308) BP: 134/103 (06/10 0703) Pulse Rate: 80 (06/10 0703)  Labs: Recent Labs    06/28/17 0604 06/29/17 0644 06/30/17 0329  HGB 8.1* 8.4* 9.1*  HCT 24.2* 26.1* 28.3*  PLT 268 322 311  CREATININE 1.19  --  1.40*    Estimated Creatinine Clearance: 43.7 mL/min (A) (by C-G formula based on SCr of 1.4 mg/dL (H)).   Medical History: Past Medical History:  Diagnosis Date  . Abdominal pain 06/04/2016  . Adenocarcinoma of left lung, stage 4 ([place]) 05/02/2016  . Alcohol abuse   . Bronchitis due to tobacco use ([place])   . Cancer ([place])    Lung  . COPD (chronic obstructive pulmonary disease) ([place])   . Dehydration 06/04/2016  . Encounter for antineoplastic chemotherapy 05/02/2016  . Gastritis   . Goals of care, counseling/discussion 05/02/2016  . Hematemesis   . HTN (hypertension) 10/30/2016  . Recurrent upper respiratory infection (URI)   . Seizures ([place]) 05/2011   new onset  . Shortness of breath     Medications:  Scheduled:  . iopamidol       Infusions:   PRN:   Assessment: 57 yo male with lung cancer just discharged from the hospital yesterday 06/29/17 who presents to the ED with worsening shortness of breath. CTa (+) for PE.  Pharmacy consulted to dose IV heparin.   Baseline PTT, PT/INR pending  No anticoagulants PTA per med rec  CBC: Hgb low 9.1 but improved from previous admit, Plts wnl  SCr elevated 1.4, CrCl ~43 ml/min  Goal of Therapy:  Heparin level 0.3-0.7 units/ml Monitor platelets by anticoagulation protocol: Yes   Plan:   Heparin 4000 units IV bolus  Heparin 1000 units/hr IV infusion  Check heparin  level in 8hrs  Daily heparin level and CBC  Monitor for signs/symptoms of bleeding or thrombosis  Peggyann Juba, PharmD, BCPS Pager: [phone] 06/30/2017,8:52 AM

## 2017-06-30 NOTE — Progress Notes (Signed)
ANTICOAGULATION CONSULT NOTE  Pharmacy Consult for Heparin  Indication: pulmonary embolus  No Known Allergies  Patient Measurements: Height: 5\' 11"  (180.3 cm) Weight: 117 lb (53.1 kg) IBW/kg (Calculated) : 75.3 Heparin Dosing Weight: actual weight  Vital Signs: Temp: 98 F (36.7 C) (06/10 1144) BP: 145/87 (06/10 1144) Pulse Rate: 91 (06/10 1144)  Labs: Recent Labs    06/28/17 0604 06/29/17 0644 06/30/17 0329 06/30/17 0852 06/30/17 1258 06/30/17 1823  HGB 8.1* 8.4* 9.1*  --   --   --   HCT 24.2* 26.1* 28.3*  --   --   --   PLT 268 322 311  --   --   --   APTT  --   --   --  29  --   --   LABPROT  --   --   --  13.2  --   --   INR  --   --   --  1.01  --   --   HEPARINUNFRC  --   --   --   --   --  0.89*  CREATININE 1.19  --  1.40*  --   --   --   TROPONINI  --   --   --   --  0.23*  --     Estimated Creatinine Clearance: 43.7 mL/min (A) (by C-G formula based on SCr of 1.4 mg/dL (H)).   Medications:  Scheduled:  . amLODipine  10 mg Oral Daily  . escitalopram  5 mg Oral QHS  . fluconazole  100 mg Oral Daily  . [START ON 07/01/2017] furosemide  20 mg Oral Daily  . guaiFENesin  1,200 mg Oral BID  . ipratropium-albuterol  3 mL Nebulization Q6H  . metoprolol tartrate  12.5 mg Oral BID  . pantoprazole  40 mg Oral BID  . polyethylene glycol  17 g Oral BID  . senna-docusate  1 tablet Oral QHS   Infusions:  . heparin     PRN:   Assessment: 57 yo male with lung cancer just discharged from the hospital yesterday 06/29/17 who presents to the ED with worsening shortness of breath. CTa (+) for PE.  Pharmacy consulted to dose IV heparin.   Baseline PTT, INR: both wnl  No anticoagulants PTA per med rec  CBC: Hgb low 9.1 but improved from previous admit, Plts wnl  SCr elevated 1.4, CrCl ~43 ml/min  Most recent (first) heparin level elevated  No line or bleeding issues per RN. Heparin infusing through port; lab draw via venipuncture  Goal of Therapy:  Heparin  level 0.3-0.7 units/ml Monitor platelets by anticoagulation protocol: Yes   Plan:   Reduce heparin to 750 units/hr IV infusion  Check heparin level in 6-8 hrs  Daily heparin level and CBC  Monitor for signs/symptoms of bleeding or thrombosis  F/u for transition to PO agent  Reuel Boom, PharmD, BCPS [phone] 06/30/2017, 7:10 PM

## 2017-06-30 NOTE — H&P (Addendum)
History and Physical    [name] BPZ:[id number] DOB: 12/06/1960 DOA: 06/30/2017  PCP: [name], MD   Patient coming from: Home    Chief Complaint: Worsening dyspnea  HPI: [name] is a 57 y.o. male with medical history significant of [name] is a 57 y.o. male with medical history significant of stage IV adenocarcinoma of the lung, COPD, hypertension, CHF who presented to the emergency department for evaluation of shortness of breath.  Patient reported that he has been having progressive shortness of breath since yesterday.  He was just discharged yesterday after being managed for COPD exacerbation and dyspnea. He has been admitted recently multiple times for the same complaint.  Patient has history of COPD and is on home oxygen at 2 L/min.  He continues to smoke. CT angios done in the emergency department showed acute pulmonary embolus .  It was also noted to be tachycardic on presentation and also reported pleuritic chest pain. Patient seen and examined the bedside in the emergency department.  He was not in acute respiratory distress during my evaluation.  Dyspnea has been his chronic problem.  He was evaluated by pulmonology on one of the last admissions and was recommended opiates for chronic dyspnea.  He has not followed with pulmonology as an outpatient yet. Patient follows with oncology [name] for lung cancer and is on chemotherapy.  Patient denies any fever , chills, denies chest pain, palpitations, headache, dysuria, diarrhea.    ED Course: Acute PE seen in the CT.  Started on heparin drip  Review of Systems: As per HPI otherwise 10 point review of systems negative.    Past Medical History:  Diagnosis Date  . Abdominal pain 06/04/2016  . Adenocarcinoma of left lung, stage 4 ([place]) 05/02/2016  . Alcohol abuse   . Bronchitis due to tobacco use ([place])   . Cancer ([place])    Lung  . COPD (chronic obstructive pulmonary disease) ([place])   . Dehydration  06/04/2016  . Encounter for antineoplastic chemotherapy 05/02/2016  . Gastritis   . Goals of care, counseling/discussion 05/02/2016  . Hematemesis   . HTN (hypertension) 10/30/2016  . Recurrent upper respiratory infection (URI)   . Seizures ([place]) 05/2011   new onset  . Shortness of breath     Past Surgical History:  Procedure Laterality Date  . ESOPHAGOGASTRODUODENOSCOPY (EGD) WITH PROPOFOL N/A 12/17/2016   Procedure: ESOPHAGOGASTRODUODENOSCOPY (EGD) WITH PROPOFOL;  Surgeon: Ladene Artist, MD;  Location: [facility];  Service: Endoscopy;  Laterality: N/A;  . IR FLUORO GUIDE PORT INSERTION RIGHT  05/13/2016  . IR US GUIDE VASC ACCESS RIGHT  05/13/2016  . NO PAST SURGERIES       reports that he has been smoking cigarettes.  He has a 3.00 pack-year smoking history. He has never used smokeless tobacco. He reports that he does not drink alcohol or use drugs.  No Known Allergies  Family History  Problem Relation Age of Onset  . Cancer Father   . Diabetes Mellitus II Mother      Prior to Admission medications   Medication Sig Start Date End Date Taking? Authorizing Provider  albuterol (PROVENTIL) (2.5 MG/3ML) 0.083% nebulizer solution 1 neb every 4-6 hours as needed for wheezing and shortness of breath Patient taking differently: Take 2.5 mg by nebulization every 4 (four) hours as needed for wheezing or shortness of breath.  04/01/17  Yes Parrett, Tammy S, NP  amLODipine (NORVASC) 10 MG tablet Take 10 mg by  mouth daily.   Yes [provider]  dexamethasone (DECADRON) 4 MG tablet Take 4 mg by mouth 2 (two) times daily. The day before, the day of, and the day after chemotherapy every three weeks   Yes [provider]  escitalopram (LEXAPRO) 5 MG tablet Take 1 tablet (5 mg total) by mouth at bedtime. 04/17/17  Yes Rosita Fire, MD  feeding supplement, ENSURE ENLIVE, (ENSURE ENLIVE) LIQD Take 237 mLs by mouth 3 (three) times daily between meals. 03/19/17  Yes  Shelly Coss, MD  fluconazole (DIFLUCAN) 100 MG tablet Take 1 tablet (100 mg total) by mouth daily. Patient taking differently: Take 100 mg by mouth daily. Continuous 03/20/17  Yes Maylea Soria, Tamsen Meek, MD  Fluticasone-Umeclidin-Vilant (TRELEGY ELLIPTA) 100-62.5-25 MCG/INH AEPB Inhale 1 puff into the lungs daily. 05/31/17  Yes Aline August, MD  furosemide (LASIX) 20 MG tablet Take 1 tablet (20 mg total) by mouth daily. 06/10/17  Yes Eugenie Filler, MD  guaiFENesin (MUCINEX) 600 MG 12 hr tablet Take 1,200 mg by mouth 2 (two) times daily.    Yes [provider]  guaiFENesin-dextromethorphan (ROBITUSSIN DM) 100-10 MG/5ML syrup Take 5 mLs by mouth every 4 (four) hours as needed for cough. 05/31/17  Yes Aline August, MD  Ipratropium-Albuterol (COMBIVENT) 20-100 MCG/ACT AERS respimat Inhale 1 puff into the lungs every 6 (six) hours. 05/31/17  Yes Aline August, MD  loratadine (CLARITIN) 10 MG tablet Take 10 mg by mouth daily.   Yes [provider]  magnesium hydroxide (MILK OF MAGNESIA) 400 MG/5ML suspension Take 30 mLs by mouth daily. 06/11/17  Yes Eugenie Filler, MD  Multiple Vitamin (MULTIVITAMIN WITH MINERALS) TABS tablet Take 1 tablet by mouth daily. 06/23/17  Yes Mikhail, Maryann, DO  ondansetron (ZOFRAN-ODT) 4 MG disintegrating tablet Take 4 mg by mouth every 8 (eight) hours as needed for nausea or vomiting.   Yes [provider]  Oxycodone HCl 10 MG TABS Take 10 mg by mouth every 4 (four) hours as needed (pain/shortness of breath).    Yes [provider]  pantoprazole (PROTONIX) 40 MG tablet Take 1 tablet (40 mg total) by mouth 2 (two) times daily. 12/18/16  Yes Eugenie Filler, MD  polyethylene glycol [facility] / Floria Raveling) packet Take 17 g by mouth 2 (two) times daily. 06/10/17  Yes Eugenie Filler, MD  predniSONE (DELTASONE) 20 MG tablet Take 2 tablets (40 mg total) by mouth daily for 6 days. 06/29/17 07/05/17 Yes Purohit, Konrad Dolores, MD  senna-docusate  (SENNA-PLUS) 8.6-50 MG tablet Take 1 tablet by mouth 2 (two) times daily. 06/10/17  Yes Eugenie Filler, MD  acetaminophen (TYLENOL) 500 MG tablet Take 1,000 mg by mouth 2 (two) times daily as needed for mild pain.    [provider]  Chlorphen-Phenyleph-ASA (ALKA-SELTZER PLUS COLD PO) Take 2 tablets by mouth at bedtime as needed (COUGH).    [provider]  metoprolol tartrate (LOPRESSOR) 25 MG tablet Take 12.5 mg by mouth 2 (two) times daily.     [provider]    Physical Exam: Vitals:   06/30/17 0308 06/30/17 0347 06/30/17 0432 06/30/17 0703  BP: (!) 132/99  (!) 135/94 (!) 134/103  Pulse: (!) 111  (!) 109 80  Resp: 16  (!) 22 14  Temp: 97.8 F (36.6 C)     TempSrc: Oral     SpO2: 100% 97% 99% 100%  Weight:      Height:        Constitutional: Not acute respiratory  distress, cachectic, chronically ill Vitals:   06/30/17 0308 06/30/17 0347 06/30/17 0432 06/30/17 0703  BP: (!) 132/99  (!) 135/94 (!) 134/103  Pulse: (!) 111  (!) 109 80  Resp: 16  (!) 22 14  Temp: 97.8 F (36.6 C)     TempSrc: Oral     SpO2: 100% 97% 99% 100%  Weight:      Height:       Eyes: PERRL, lids and conjunctivae normal ENMT: Mucous membranes are moist. Posterior pharynx clear of any exudate or lesions.Normal dentition.  Neck: normal, supple, no masses, no thyromegaly Respiratory: Bilateral decreased air entry, bilateral basal crackles Cardiovascular: Regular rate and rhythm, no murmurs / rubs / gallops. No extremity edema. 2+ pedal pulses. No carotid bruits.  Abdomen: no tenderness, no masses palpated. No hepatosplenomegaly. Bowel sounds positive.  Musculoskeletal: no clubbing / cyanosis. No joint deformity upper and lower extremities. Good ROM, no contractures. Normal muscle tone.  Skin: no rashes, lesions, ulcers. No induration Neurologic: CN 2-12 grossly intact. Sensation intact, DTR normal. Strength 5/5 in all 4.  Psychiatric: Normal judgment and insight. Alert and  oriented x 3. Normal mood.   Foley Catheter:None  Labs on Admission: I have personally reviewed following labs and imaging studies  CBC: Recent Labs  Lab 06/24/17 0428 06/25/17 0750 06/26/17 0520 06/27/17 0511 06/28/17 0604 06/29/17 0644 06/30/17 0329  WBC 4.8 7.2 8.9 12.4* 12.5* 15.8* 18.1*  NEUTROABS 3.6 5.9  --   --   --   --  15.9*  HGB 9.7* 8.8* 7.7* 7.8* 8.1* 8.4* 9.1*  HCT 29.3* 26.9* 22.9* 24.1* 24.2* 26.1* 28.3*  MCV 99.0 98.2 98.7 100.4* 101.3* 101.6* 102.2*  PLT 188 229 214 272 268 322 759   Basic Metabolic Panel: Recent Labs  Lab 06/25/17 0750 06/26/17 0520 06/27/17 0511 06/28/17 0604 06/30/17 0329  NA 139 140 141 141 142  K 4.6 4.4 4.4 4.5 4.1  CL 94* 101 106 103 101  CO2 32 32 30 31 33*  GLUCOSE 139* 137* 103* 127* 143*  BUN 28* 24* 21* 23* 29*  CREATININE 2.14* 1.65* 1.26* 1.19 1.40*  CALCIUM 9.3 9.0 8.9 9.0 9.4   GFR: Estimated Creatinine Clearance: 43.7 mL/min (A) (by C-G formula based on SCr of 1.4 mg/dL (H)). Liver Function Tests: Recent Labs  Lab 06/25/17 0750 06/27/17 0511 06/28/17 0604  AST 44* 33 27  ALT 47 34 31  ALKPHOS 115 85 86  BILITOT 0.3 0.5 0.2*  PROT 6.8 5.6* 5.7*  ALBUMIN 3.3* 2.6* 2.7*   No results for input(s): LIPASE, AMYLASE in the last 168 hours. No results for input(s): AMMONIA in the last 168 hours. Coagulation Profile: No results for input(s): INR, PROTIME in the last 168 hours. Cardiac Enzymes: No results for input(s): CKTOTAL, CKMB, CKMBINDEX, TROPONINI in the last 168 hours. BNP (last 3 results) No results for input(s): PROBNP in the last 8760 hours. HbA1C: No results for input(s): HGBA1C in the last 72 hours. CBG: No results for input(s): GLUCAP in the last 168 hours. Lipid Profile: No results for input(s): CHOL, HDL, LDLCALC, TRIG, CHOLHDL, LDLDIRECT in the last 72 hours. Thyroid Function Tests: No results for input(s): TSH, T4TOTAL, FREET4, T3FREE, THYROIDAB in the last 72 hours. Anemia Panel: No  results for input(s): VITAMINB12, FOLATE, FERRITIN, TIBC, IRON, RETICCTPCT in the last 72 hours. Urine analysis:    Component Value Date/Time   COLORURINE YELLOW 05/20/2017 0940   APPEARANCEUR CLEAR 05/20/2017 0940   LABSPEC 1.015 05/20/2017 0940  PHURINE 5.0 05/20/2017 0940   GLUCOSEU NEGATIVE 05/20/2017 0940   HGBUR NEGATIVE 05/20/2017 0940   BILIRUBINUR NEGATIVE 05/20/2017 0940   KETONESUR 5 (A) 05/20/2017 0940   PROTEINUR 30 (A) 05/20/2017 0940   UROBILINOGEN 0.2 05/29/2011 0122   NITRITE NEGATIVE 05/20/2017 0940   LEUKOCYTESUR NEGATIVE 05/20/2017 0940    Radiological Exams on Admission: Dg Chest 2 View  Result Date: 06/30/2017 CLINICAL DATA:  Initial evaluation for acute shortness of breath. History of lung cancer. EXAM: CHEST - 2 VIEW COMPARISON:  Prior radiograph from 06/25/2017. FINDINGS: Right-sided Port-A-Cath in place with tip overlying the proximal right atrium. Cardiac and mediastinal silhouettes stable in size and contour, and remain within normal limits. Lungs normally inflated. Small moderate right pleural effusion is increased from previous. Scattered vascular congestion with crowding throughout the right lung, progressed from previous. Probable superimposed right basilar atelectasis. Blunting of the left costophrenic angle, which could reflect effusion and/or chronic pleural reaction/scarring, similar to previous. Left lung otherwise largely clear. Left perihilar architectural distortion with possible spiculated nodule, similar to previous. No pneumothorax. No acute osseous abnormality. IMPRESSION: 1. Small to moderate layering right pleural effusion, increased from previous, with worsened vascular congestion/crowding and probable atelectatic changes within the right lung. 2. Blunting of the left costophrenic angle, which could reflect small effusion and/or chronic pleural reaction/scarring, similar to previous. 3. Left perihilar architectural distortion with underlying  spiculated nodular density, consistent with known lung malignancy. Electronically Signed   By: Jeannine Boga M.D.   On: 06/30/2017 04:36   Ct Angio Chest Pe W And/or Wo Contrast  Addendum Date: 06/30/2017   ADDENDUM REPORT: 06/30/2017 08:21 ADDENDUM: Critical Value/emergent results were called by telephone at the time of interpretation on 06/30/2017 at 8:21 am to [place] in the ED who verbally acknowledged these results. Electronically Signed   By: Genevie Ann M.D.   On: 06/30/2017 08:21   Result Date: 06/30/2017 CLINICAL DATA:  57 year old male with stage IV lung cancer and severe shortness of breath. EXAM: CT ANGIOGRAPHY CHEST WITH CONTRAST TECHNIQUE: Multidetector CT imaging of the chest was performed using the standard protocol during bolus administration of intravenous contrast. Multiplanar CT image reconstructions and MIPs were obtained to evaluate the vascular anatomy. CONTRAST:  125mL ISOVUE-370 IOPAMIDOL (ISOVUE-370) INJECTION 76% COMPARISON:  Chest CT 04/13/2017, chest CTA 03/13/2017. FINDINGS: Cardiovascular: Good contrast bolus timing in the pulmonary arterial tree. There is no right lung pulmonary artery filling defect. 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 However, this is inseparable from abnormal peribronchial soft tissue tracking to the left hilum from the spiculated left upper lobe tumor. The vessel thrombus is primarily nonocclusive, although distal obliteration of the left upper lobe artery occurs but is stable since 03/13/2017. 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 Additionally, the right chest Port-A-Cath was injected. It is unclear whether there could be thrombus within the  SVC and right atrium (series 5, image 184). This could be mixing artifact. Stable cardiac size. No pericardial effusion. Stable visible aorta. Mediastinum/Nodes: Loss of normal mediastinal fat planes, but no discrete mediastinal mass identified. Lungs/Pleura: Continued moderate size layering right pleural effusion, stable since March. Simple fluid density. Trace layering left pleural fluid. Stable major airway patency. Mildly regressed size and  nodularity of the left upper lobe spiculated mass since March and 03/13/2017 (compare series 6, image 37 today to series 6, image 45 in February). However, contiguous opacity from the mass to the left hilum persists. Underlying emphysema, severe on the left.  No new pulmonary opacity. Upper Abdomen: Small volume perihepatic free fluid along the periphery of the visible right hepatic lobe is new, but gastrohepatic ligament fluid as regressed and appears virtually resolved since 04/13/2017. Liver parenchyma, visible spleen, adrenal glands, kidneys, pancreas, and bowel appear within normal limits. Musculoskeletal: Stable visualized osseous structures. Heterogeneous sclerosis of T12 remain stable since February. Review of the MIP images confirms the above findings. IMPRESSION: 1. Positive for evidence of acute pulmonary thrombus at the left main pulmonary artery bifurcation and extending into the left upper lobe. Much of the visible thrombus is nonocclusive, and it might be tumor related, with adjacent peribronchial opacity tracking from the left upper lobe carcinoma to the hilum. 2. 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 No other pulmonary thrombus identified. 3. Mildly regressed size of the spiculated left upper lung mass since March. 4. Continued moderate size layering right pleural effusion. Underlying emphysema. 5. Small volume perihepatic ascites is new since March but the previously-seen gastrohepatic ligament fluid has  regressed. Electronically Signed: By: Genevie Ann M.D. On: 06/30/2017 08:13     Assessment/Plan Principal Problem:   PE (pulmonary thromboembolism) ([place]) Active Problems:   Tobacco abuse   Adenocarcinoma of left lung, stage 4 ([facility])   COPD (chronic obstructive pulmonary disease) ([facility])   SOB (shortness of breath)   Chronic respiratory failure with hypoxia ([facility])   Pulmonary embolism ([facility])  Acute PE: CT angio showed  evidence of acute pulmonary thrombus at the left main pulmonary artery bifurcation and extending into the left upper lobe.  Possibility of tumor thrombus also. Started on heparin drip.  Patient might benefit from Lovenox on discharge because of the history of lung cancer. We will get an echocardiogram.  Elevated Troponin: Most likely associated with PE.  Does not complain of any chest pain.  Continue to cycle the troponin.  EKG did not show any significant ischemic changes.  Shortness of breath: Chronic problem.  Most likely associated with new diagnosis PE, COPD exacerbation, lung cancer.  Chest x-ray did not show any pneumonia. Showed  moderate size layering right pleural effusion Has multiple admission in the past with complaints of dyspnea.  We will also consult palliative care on this admission for discussion of goals of care given his recurrent admissions and  stage IV lung cancer.  COPD: On 2 L oxygen at home.  Continue supplemental oxygen as needed. Continue bronchodilators,  Mucolytics. Patient continues to smoke.  Patient also has leukocytosis most likely acid with the steroids.  He did not have any wheezing during my evaluation.  He has already been given a dose of IV steroid.  Continue his home inhalers.  Smoker: Reports that he smokes 1 to 2 cigarettes a day.  Counseled for smoking cessation.  Adenocarcinoma of the left lung, stage IV: Follows with oncology as an outpatient.  Has a Chemo-Port on the right chest.  Hypertension: Blood pressure stable.  Continue  home  meds.  Anemia: Most likely chronic anemia associated with malignancy.  Currently H and H is stable.  Recently had blood transfusion.  AKI and CKD stage 3: Kidney function slightly around his baseline.  Baseline creatinine ranges from 1.5-1.7.  Check BMP tomorrow.  History of  CHF: On lasix 20 mg at home.  Bilateral basal crackles auscultated on lung examination.  We will give him a dose of 40 mg of IV Lasix.Does not have any peripheral edema.  Mildly elevated BNP  Constipation: Patient on opiates for lung cancer and dyspnea.  Continue bowel regimen for constipation.     Severity of Illness: The appropriate patient status for this patient is INPATIENT.  DVT prophylaxis: heparin IV Code Status: DNR Family Communication: None present at the bedside Consults called: Palliative care  Please Note: This patient record was dictated using Editor, commissioning. Chart creation errors have been sought, but may not always have been located. Such creation errors do not reflect on the Standard of Medical Care.      Shelly Coss MD [facility] Pager [phone]  If 7PM-7AM, please contact night-coverage [email or web address] Password [email or web address]  06/30/2017, 10:15 AM

## 2017-06-30 NOTE — ED Provider Notes (Signed)
Received sign out at shift change from [facility]. See her H&P for further details. In brief this is a 57yo male with a hx of COPD and stage IV adenocarcinoma of the left lung who presents to the ED for evaluation of worsening shortness of breath (went  From 2L [place] to 4L [place].) He was discharged from the hospital yesterday. Also reporting pleuritic chest pain.   CT angio of the chest reveals thrombus in the distal left main pulmonary artery which according to radiologist Dr. Gaspar Cola looks non-occlusive and may be tumor related thrombus. Heparin ordered. I discussed with patient that he will be staying in the hospital. He has an active, signed DNR at bedside. On exam, no apparent leg swelling and patient denies calf/leg pain.   Have discussed this patient with admitting Hospitalist Dr. Tawanna Solo who will admit the patient to medicine.      Glyn Ade, PA-C 07/02/17 1436    Duffy Bruce, MD 07/03/17 [phone]

## 2017-06-30 NOTE — Progress Notes (Signed)
CRITICAL VALUE ALERT  Critical Value:  Troponin 0.28  Date & Time Notied:  06/30/2017  Provider Notified: Baltazar Najjar NP   Orders Received/Actions taken:

## 2017-06-30 NOTE — ED Notes (Signed)
Pa Shrosbree notified of a patient's Troponin result.

## 2017-06-30 NOTE — Care Management Note (Signed)
Case Management Note  CM noted pt's return.  Contacted Sharyn Lull with [facility] who CM initiated care with on 06/24/2017.  Sharyn Lull advised they have not been able to start care due to not receiving a response from [facility]'s provider that they would sign orders for [facility].  She advised they contacted every number in the pt's chart for the provider and have still not heard back from them.  CM advised to attempt using pt's oncologist, [name].  Additionally advised pt's was being admitted.  They will follow for needs and pt will need resumption of care [facility] orders on D/C unless pt goes to SNF.  CM will follow on inpt unit.

## 2017-06-30 NOTE — ED Notes (Signed)
ED TO INPATIENT HANDOFF REPORT  Name/Age/Gender [name] 57 y.o. male  Code Status    Code Status Orders  (From admission, onward)        Start     Ordered   06/30/17 1015  Do not attempt resuscitation (DNR)  Continuous    Question Answer Comment  In the event of cardiac or respiratory ARREST Do not call a "code blue"   In the event of cardiac or respiratory ARREST Do not perform Intubation, CPR, defibrillation or ACLS   In the event of cardiac or respiratory ARREST Use medication by any route, position, wound care, and other measures to relive pain and suffering. May use oxygen, suction and manual treatment of airway obstruction as needed for comfort.      06/30/17 1015    Code Status History    Date Active Date Inactive Code Status Order ID Comments User Context   06/25/2017 1117 06/29/2017 1603 Full Code [id number]  Shelly Coss, MD ED   06/21/2017 1044 06/23/2017 2044 DNR [id number]  Mariel Aloe, MD ED   06/05/2017 1602 06/10/2017 2253 Full Code [id number]  Purohit, Konrad Dolores, MD Inpatient   05/28/2017 1147 05/31/2017 2012 DNR [id number]  Nita Sells, MD ED   04/10/2017 1148 04/17/2017 1837 Full Code [id number]  Damita Lack, MD ED   03/09/2017 2359 03/19/2017 1931 Full Code [id number]  Purohit, Konrad Dolores, MD Inpatient   02/14/2017 0902 02/16/2017 1455 Full Code [id number]  Florencia Reasons, MD Inpatient   02/10/2017 0031 02/14/2017 0902 DNR [id number]  Norval Morton, MD ED   12/30/2016 2325 01/07/2017 2109 DNR [id number]  Elodia [name]., MD Inpatient   12/17/2016 0934 12/24/2016 1811 DNR [id number]  Thurnell Lose, MD Inpatient   12/16/2016 1649 12/17/2016 0934 Full Code [id number]  Charlynne Cousins, MD Inpatient      Home/SNF/Other Nursing Home  Chief Complaint SOB  Level of Care/Admitting Diagnosis ED Disposition    ED Disposition Condition [place]: [facility] [[id number]]  Level of Care: Telemetry [5]  Admit to tele  based on following criteria: Other see comments  Comments: PE  Diagnosis: Pulmonary embolism [facility]) [[id number]]  Admitting Physician: Shelly Coss [[id number]]  Attending Physician: Shelly Coss [[id number]]  Estimated length of stay: past midnight tomorrow  Certification:: I certify this patient will need inpatient services for at least 2 midnights  PT Class (Do Not Modify): Inpatient [101]  PT Acc Code (Do Not Modify): Private [1]       Medical History Past Medical History:  Diagnosis Date  . Abdominal pain 06/04/2016  . Adenocarcinoma of left lung, stage 4 ([place]) 05/02/2016  . Alcohol abuse   . Bronchitis due to tobacco use ([place])   . Cancer ([place])    Lung  . COPD (chronic obstructive pulmonary disease) ([place])   . Dehydration 06/04/2016  . Encounter for antineoplastic chemotherapy 05/02/2016  . Gastritis   . Goals of care, counseling/discussion 05/02/2016  . Hematemesis   . HTN (hypertension) 10/30/2016  . Recurrent upper respiratory infection (URI)   . Seizures ([place]) 05/2011   new onset  . Shortness of breath     Allergies No Known Allergies  IV Location/Drains/Wounds Patient Lines/Drains/Airways Status   Active Line/Drains/Airways    Name:   Placement date:   Placement time:   Site:   Days:   Implanted Port 05/13/16 Right Chest   05/13/16    -    Chest  413          Labs/Imaging Results for orders placed or performed during the hospital encounter of 06/30/17 (from the past 48 hour(s))  CBC with Differential     Status: Abnormal   Collection Time: 06/30/17  3:29 AM  Result Value Ref Range   WBC 18.1 (H) 4.0 - 10.5 K/uL   RBC 2.77 (L) 4.22 - 5.81 MIL/uL   Hemoglobin 9.1 (L) 13.0 - 17.0 g/dL   HCT 28.3 (L) 39.0 - 52.0 %   MCV 102.2 (H) 78.0 - 100.0 fL   MCH 32.9 26.0 - 34.0 pg   MCHC 32.2 30.0 - 36.0 g/dL   RDW 15.5 11.5 - 15.5 %   Platelets 311 150 - 400 K/uL   Neutrophils Relative % 81 %   Lymphocytes Relative 2 %   Monocytes Relative 10 %   Eosinophils  Relative 0 %   Basophils Relative 0 %   Metamyelocytes Relative 2 %   Myelocytes 5 %   Neutro Abs 15.9 (H) 1.7 - 7.7 K/uL   Lymphs Abs 0.4 (L) 0.7 - 4.0 K/uL   Monocytes Absolute 1.8 (H) 0.1 - 1.0 K/uL   Eosinophils Absolute 0.0 0.0 - 0.7 K/uL   Basophils Absolute 0.0 0.0 - 0.1 K/uL   RBC Morphology POLYCHROMASIA PRESENT    WBC Morphology MILD LEFT SHIFT (1-5% METAS, OCC MYELO, OCC BANDS)     Comment: Performed at [facility], [place] [address], [place], [place] [address]  Basic metabolic panel     Status: Abnormal   Collection Time: 06/30/17  3:29 AM  Result Value Ref Range   Sodium 142 135 - 145 mmol/L   Potassium 4.1 3.5 - 5.1 mmol/L   Chloride 101 101 - 111 mmol/L   CO2 33 (H) 22 - 32 mmol/L   Glucose, Bld 143 (H) 65 - 99 mg/dL   [name] 29 (H) 6 - 20 mg/dL   Creatinine, Ser 1.40 (H) 0.61 - 1.24 mg/dL   Calcium 9.4 8.9 - 10.3 mg/dL   GFR calc non Af Amer 54 (L) >60 mL/min   GFR calc Af Amer >60 >60 mL/min    Comment: (NOTE) The eGFR has been calculated using the CKD EPI equation. This calculation has not been validated in all clinical situations. eGFR's persistently <60 mL/min signify possible Chronic Kidney Disease.    Anion gap 8 5 - 15    Comment: Performed at [facility], [place] [address], [place], [place] [address]  I-stat troponin, ED     Status: None   Collection Time: 06/30/17  4:55 AM  Result Value Ref Range   Troponin i, poc 0.05 0.00 - 0.08 ng/mL   Comment 3            Comment: Due to the release kinetics of cTnI, a negative result within the first hours of the onset of symptoms does not rule out myocardial infarction with certainty. If myocardial infarction is still suspected, repeat the test at appropriate intervals.   Brain natriuretic peptide     Status: Abnormal   Collection Time: 06/30/17  8:12 AM  Result Value Ref Range   B Natriuretic Peptide 224.5 (H) 0.0 - 100.0 pg/mL    Comment: Performed at [facility], [place] [address] [place], [place] [address]  I-Stat Troponin, ED (not at [facility])     Status: Abnormal   Collection Time: 06/30/17  8:18 AM  Result Value Ref Range   Troponin  i, poc 0.12 (HH) 0.00 - 0.08 ng/mL   Comment NOTIFIED PHYSICIAN    Comment 3            Comment: Due to the release kinetics of cTnI, a negative result within the first hours of the onset of symptoms does not rule out myocardial infarction with certainty. If myocardial infarction is still suspected, repeat the test at appropriate intervals.   APTT     Status: None   Collection Time: 06/30/17  8:52 AM  Result Value Ref Range   aPTT 29 24 - 36 seconds    Comment: Performed at [facility], [place] [address], [place], [place] [address]  Protime-INR     Status: None   Collection Time: 06/30/17  8:52 AM  Result Value Ref Range   Prothrombin Time 13.2 11.4 - 15.2 seconds   INR 1.01     Comment: Performed at [facility], [place] [address] [place], [place] [address]   Dg Chest 2 View  Result Date: 06/30/2017 CLINICAL DATA:  Initial evaluation for acute shortness of breath. History of lung cancer. EXAM: CHEST - 2 VIEW COMPARISON:  Prior radiograph from 06/25/2017. FINDINGS: Right-sided Port-A-Cath in place with tip overlying the proximal right atrium. Cardiac and mediastinal silhouettes stable in size and contour, and remain within normal limits. Lungs normally inflated. Small moderate right pleural effusion is increased from previous. Scattered vascular congestion with crowding throughout the right lung, progressed from previous. Probable superimposed right basilar atelectasis. Blunting of the left costophrenic angle, which could reflect effusion and/or chronic pleural reaction/scarring, similar to previous. Left lung otherwise largely clear. Left perihilar architectural distortion with possible spiculated nodule, similar to previous. No pneumothorax. No acute osseous  abnormality. IMPRESSION: 1. Small to moderate layering right pleural effusion, increased from previous, with worsened vascular congestion/crowding and probable atelectatic changes within the right lung. 2. Blunting of the left costophrenic angle, which could reflect small effusion and/or chronic pleural reaction/scarring, similar to previous. 3. Left perihilar architectural distortion with underlying spiculated nodular density, consistent with known lung malignancy. Electronically Signed   By: Jeannine Boga M.D.   On: 06/30/2017 04:36   Ct Angio Chest Pe W And/or Wo Contrast  Addendum Date: 06/30/2017   ADDENDUM REPORT: 06/30/2017 08:21 ADDENDUM: Critical Value/emergent results were called by telephone at the time of interpretation on 06/30/2017 at 8:21 am to Lloyd in the ED who verbally acknowledged these results. Electronically Signed   By: Genevie Ann M.D.   On: 06/30/2017 08:21   Result Date: 06/30/2017 CLINICAL DATA:  57 year old male with stage IV lung cancer and severe shortness of breath. EXAM: CT ANGIOGRAPHY CHEST WITH CONTRAST TECHNIQUE: Multidetector CT imaging of the chest was performed using the standard protocol during bolus administration of intravenous contrast. Multiplanar CT image reconstructions and MIPs were obtained to evaluate the vascular anatomy. CONTRAST:  1107m ISOVUE-370 IOPAMIDOL (ISOVUE-370) INJECTION 76% COMPARISON:  Chest CT 04/13/2017, chest CTA 03/13/2017. FINDINGS: Cardiovascular: Good contrast bolus timing in the pulmonary arterial tree. There is no right lung pulmonary artery filling defect. 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 However, this is inseparable from abnormal peribronchial soft tissue tracking to the left hilum from the spiculated left upper lobe tumor. The vessel  thrombus is primarily nonocclusive, although distal obliteration of the left upper lobe artery  occurs but is stable since 03/13/2017. 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 Additionally, the right chest Port-A-Cath was injected. It is unclear whether there could be thrombus within the SVC and right atrium (series 5, image 184). This could be mixing artifact. Stable cardiac size. No pericardial effusion. Stable visible aorta. Mediastinum/Nodes: Loss of normal mediastinal fat planes, but no discrete mediastinal mass identified. Lungs/Pleura: Continued moderate size layering right pleural effusion, stable since March. Simple fluid density. Trace layering left pleural fluid. Stable major airway patency. Mildly regressed size and nodularity of the left upper lobe spiculated mass since March and 03/13/2017 (compare series 6, image 37 today to series 6, image 45 in February). However, contiguous opacity from the mass to the left hilum persists. Underlying emphysema, severe on the left.  No new pulmonary opacity. Upper Abdomen: Small volume perihepatic free fluid along the periphery of the visible right hepatic lobe is new, but gastrohepatic ligament fluid as regressed and appears virtually resolved since 04/13/2017. Liver parenchyma, visible spleen, adrenal glands, kidneys, pancreas, and bowel appear within normal limits. Musculoskeletal: Stable visualized osseous structures. Heterogeneous sclerosis of T12 remain stable since February. Review of the MIP images confirms the above findings. IMPRESSION: 1. Positive for evidence of acute pulmonary thrombus at the left main pulmonary artery bifurcation and extending into the left upper lobe. Much of the visible thrombus is nonocclusive, and it might be tumor related, with adjacent peribronchial opacity tracking from the left upper lobe carcinoma to the  hilum. 2. 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 No other pulmonary thrombus identified. 3. Mildly regressed size of the spiculated left upper lung mass since March. 4. Continued moderate size layering right pleural effusion. Underlying emphysema. 5. Small volume perihepatic ascites is new since March but the previously-seen gastrohepatic ligament fluid has regressed. Electronically Signed: By: Genevie Ann M.D. On: 06/30/2017 08:13    Pending Labs Unresulted Labs (From admission, onward)   Start     Ordered   07/01/17 9242  Basic metabolic panel  Tomorrow morning,   R     06/30/17 1015   07/01/17 0500  CBC  Tomorrow morning,   R     06/30/17 1015   06/30/17 1800  Heparin level (unfractionated)  Once-Timed,   R     06/30/17 0901   06/30/17 1300  Troponin I (q 6hr x 3)  Now then every 6 hours,   R     06/30/17 1033      Vitals/Pain Today's Vitals   06/30/17 0308 06/30/17 0347 06/30/17 0432 06/30/17 0703  BP: (!) 132/99  (!) 135/94 (!) 134/103  Pulse: (!) 111  (!) 109 80  Resp: 16  (!) 22 14  Temp: 97.8 F (36.6 C)     TempSrc: Oral     SpO2: 100% 97% 99% 100%  Weight:      Height:      PainSc:        Isolation Precautions No active isolations  Medications Medications  heparin ADULT infusion 100 units/mL (25000 units/239m sodium chloride 0.45%) (1,000 Units/hr Intravenous New Bag/Given 06/30/17 0922)  ipratropium-albuterol (DUONEB) 0.5-2.5 (3) MG/3ML nebulizer solution 3 mL (has no administration in time range)  furosemide (LASIX) injection 40 mg (has no administration in time range)  amLODipine (NORVASC) tablet 10 mg (has no administration in time range)  escitalopram (LEXAPRO) tablet 5 mg (has  no administration in time range)  fluconazole (DIFLUCAN) tablet 100 mg (has no administration in time range)  furosemide (LASIX) tablet 20 mg (has no administration in time range)  guaiFENesin (MUCINEX) 12 hr tablet 1,200 mg (has no  administration in time range)  polyethylene glycol (MIRALAX / GLYCOLAX) packet 17 g (has no administration in time range)  pantoprazole (PROTONIX) EC tablet 40 mg (has no administration in time range)  Oxycodone HCl TABS 10 mg (has no administration in time range)  ondansetron (ZOFRAN-ODT) disintegrating tablet 4 mg (has no administration in time range)  metoprolol tartrate (LOPRESSOR) tablet 12.5 mg (has no administration in time range)  Fluticasone-Umeclidin-Vilant 100-62.5-25 MCG/INH AEPB 1 puff (has no administration in time range)  albuterol (PROVENTIL) (2.5 MG/3ML) 0.083% nebulizer solution 5 mg (5 mg Nebulization Given 06/30/17 0343)  ipratropium (ATROVENT) nebulizer solution 0.5 mg (0.5 mg Nebulization Given 06/30/17 0343)  methylPREDNISolone sodium succinate (SOLU-MEDROL) 125 mg/2 mL injection 125 mg (125 mg Intravenous Given 06/30/17 0500)  iopamidol (ISOVUE-370) 76 % injection 100 mL (100 mLs Intravenous Contrast Given 06/30/17 0712)  heparin bolus via infusion 4,000 Units (4,000 Units Intravenous Bolus from Bag 06/30/17 0923)    Mobility walks

## 2017-07-01 ENCOUNTER — Inpatient Hospital Stay (HOSPITAL_COMMUNITY): Payer: Medicaid Other

## 2017-07-01 DIAGNOSIS — C3492 Malignant neoplasm of unspecified part of left bronchus or lung: Secondary | ICD-10-CM

## 2017-07-01 DIAGNOSIS — J9611 Chronic respiratory failure with hypoxia: Secondary | ICD-10-CM

## 2017-07-01 DIAGNOSIS — I361 Nonrheumatic tricuspid (valve) insufficiency: Secondary | ICD-10-CM

## 2017-07-01 DIAGNOSIS — I2699 Other pulmonary embolism without acute cor pulmonale: Principal | ICD-10-CM

## 2017-07-01 DIAGNOSIS — R000 Tachycardia, unspecified: Secondary | ICD-10-CM

## 2017-07-01 LAB — BASIC METABOLIC PANEL
ANION GAP: 8 (ref 5–15)
BUN: 30 mg/dL — AB (ref 6–20)
CHLORIDE: 100 mmol/L — AB (ref 101–111)
CO2: 34 mmol/L — ABNORMAL HIGH (ref 22–32)
Calcium: 9.1 mg/dL (ref 8.9–10.3)
Creatinine, Ser: 1.49 mg/dL — ABNORMAL HIGH (ref 0.61–1.24)
GFR calc Af Amer: 58 mL/min — ABNORMAL LOW (ref >60)
GFR, EST NON AFRICAN AMERICAN: 50 mL/min — AB (ref >60)
GLUCOSE: 130 mg/dL — AB (ref 65–99)
POTASSIUM: 3.8 mmol/L (ref 3.5–5.1)
SODIUM: 142 mmol/L (ref 135–145)

## 2017-07-01 LAB — CBC
HEMATOCRIT: 25.9 % — AB (ref 39.0–52.0)
Hemoglobin: 8.3 g/dL — ABNORMAL LOW (ref 13.0–17.0)
MCH: 32.3 pg (ref 26.0–34.0)
MCHC: 32 g/dL (ref 30.0–36.0)
MCV: 100.8 fL — AB (ref 78.0–100.0)
Platelets: 248 10*3/uL (ref 150–400)
RBC: 2.57 MIL/uL — ABNORMAL LOW (ref 4.22–5.81)
RDW: 15.9 % — ABNORMAL HIGH (ref 11.5–15.5)
WBC: 15.2 10*3/uL — AB (ref 4.0–10.5)

## 2017-07-01 LAB — TROPONIN I: TROPONIN I: 0.18 ng/mL — AB (ref <0.03)

## 2017-07-01 LAB — VITAMIN B12: VITAMIN B 12: 865 pg/mL (ref 180–914)

## 2017-07-01 LAB — ECHOCARDIOGRAM COMPLETE
Height: 71 in
WEIGHTICAEL: 1872 [oz_av]

## 2017-07-01 LAB — HEPARIN LEVEL (UNFRACTIONATED): Heparin Unfractionated: 0.44 IU/mL (ref 0.30–0.70)

## 2017-07-01 MED ORDER — MORPHINE SULFATE (PF) 4 MG/ML IV SOLN
4.0000 mg | INTRAVENOUS | Status: DC | PRN
  Filled 2017-07-01: qty 1

## 2017-07-01 MED ORDER — ENOXAPARIN SODIUM 60 MG/0.6ML ~~LOC~~ SOLN
1.0000 mg/kg | Freq: Two times a day (BID) | SUBCUTANEOUS | Status: DC
  Filled 2017-07-01: qty 0.6

## 2017-07-01 MED ORDER — HEPARIN (PORCINE) IN NACL 100-0.45 UNIT/ML-% IJ SOLN
800.0000 [IU]/h | INTRAMUSCULAR | Status: DC
  Administered 2017-07-01: 800 [IU]/h via INTRAVENOUS

## 2017-07-01 MED ORDER — BOOST / RESOURCE BREEZE PO LIQD CUSTOM: 1.0000 | ORAL | Status: DC

## 2017-07-01 MED ORDER — APIXABAN 5 MG PO TABS: 5.0000 mg | ORAL_TABLET | Freq: Two times a day (BID) | ORAL | Status: DC

## 2017-07-01 MED ORDER — OXYCODONE HCL 5 MG PO TABS
10.0000 mg | ORAL_TABLET | ORAL | Status: DC | PRN
  Administered 2017-07-01 – 2017-07-03 (×8): 10 mg via ORAL
  Filled 2017-07-01 (×9): qty 2

## 2017-07-01 MED ORDER — ONDANSETRON HCL 4 MG/2ML IJ SOLN
4.0000 mg | Freq: Once | INTRAMUSCULAR | Status: AC | PRN
  Administered 2017-07-01: 4 mg via INTRAVENOUS
  Filled 2017-07-01: qty 2

## 2017-07-01 MED ORDER — ENSURE ENLIVE PO LIQD: 237.0000 mL | Freq: Two times a day (BID) | ORAL | Status: DC

## 2017-07-01 MED ORDER — APIXABAN 5 MG PO TABS
10.0000 mg | ORAL_TABLET | Freq: Two times a day (BID) | ORAL | Status: DC
  Administered 2017-07-01 – 2017-07-03 (×5): 10 mg via ORAL
  Filled 2017-07-01 (×2): qty 2
  Filled 2017-07-01: qty 4
  Filled 2017-07-01 (×4): qty 2

## 2017-07-01 NOTE — Progress Notes (Signed)
Initial Nutrition Assessment  DOCUMENTATION CODES:   Non-severe (moderate) malnutrition in context of chronic illness, Underweight  INTERVENTION:  - Will order Ensure Enlive BID, each supplement provides 350 kcal and 20 grams of protein. - Will order Boost Breeze once/day, this supplement provides 250 kcal and 9 grams of protein. - Continue to encourage PO intakes.   NUTRITION DIAGNOSIS:   Moderate Malnutrition related to chronic illness, catabolic illness, cancer and cancer related treatments as evidenced by moderate fat depletion, moderate muscle depletion.  GOAL:   Patient will meet greater than or equal to 90% of their needs  MONITOR:   PO intake, Supplement acceptance, Weight trends, Labs  REASON FOR ASSESSMENT:   Other (Comment)(underweight BMI)  ASSESSMENT:   57 y.o. male with medical history significant of stage IV adenocarcinoma of the lung, COPD on 2L O2 at home, HTN, CHF. He presented to the ED for evaluation of SOB.  Patient reported that he has been having progressive shortness of breath since yesterday (6/9); he was admitted 6/5-6/9 for management of COPD exacerbation and dyspnea. He has been admitted recently multiple times for the same complaint. He continues to smoke.  Patient was seen by this RD on 6/7. Following that visit until d/c on 6/9, patient was eating 100% of meals. He has eaten 100% of meals since re-admission yesterday. This is patient's baseline. He continues to report good appetite with no chewing/swallowing issues, no abdominal pain/pressure or nausea with PO intakes, and no overt taste changes. Patient likes Ensure and will typically drink it when provided.    Medications reviewed; 40 mg IV Lasix x1 yesterday, 20 mg oral Lasix/day, 40 mg oral Protonix BID, 1 packet Miralax BID, 1 tablet Senokot/day.  Labs reviewed; Cl: 100 mmol/L, BUN: 30 mg/dL, creatinine: 1.49 mg/dL, GFR: 58 mL/min.      NUTRITION - FOCUSED PHYSICAL EXAM:    Most Recent  Value  Orbital Region  No depletion  Upper Arm Region  Moderate depletion  Thoracic and Lumbar Region  Moderate depletion  Buccal Region  Mild depletion  Temple Region  Mild depletion  Clavicle Bone Region  Moderate depletion  Clavicle and Acromion Bone Region  Moderate depletion  Scapular Bone Region  Mild depletion  Dorsal Hand  Mild depletion  Patellar Region  Moderate depletion  Anterior Thigh Region  Moderate depletion  Posterior Calf Region  Moderate depletion  Edema (RD Assessment)  Mild [BLE]  Hair  Reviewed  Eyes  Reviewed  Mouth  Reviewed  Skin  Reviewed  Nails  Reviewed       Diet Order:   Diet Order           Diet Heart Room service appropriate? Yes; Fluid consistency: Thin  Diet effective now          EDUCATION NEEDS:   No education needs have been identified at this time  Skin:  Skin Assessment: Reviewed RN Assessment  Last BM:  6/9  Height:   Ht Readings from Last 1 Encounters:  06/30/17 5\' 11"  (1.803 m)    Weight:   Wt Readings from Last 1 Encounters:  06/30/17 117 lb (53.1 kg)    Ideal Body Weight:  78.18 kg  BMI:  Body mass index is 16.32 kg/m.  Estimated Nutritional Needs:   Kcal:  8546-2703 (35-40 kcal/kg)  Protein:  80-90 grams (1.5-1.7 grams/kg)  Fluid:  >/= 1.8 L/day     Johnathan Matin, MS, RD, LDN, CNSC Inpatient Clinical Dietitian Pager # [phone] After hours/weekend pager #  [phone]  

## 2017-07-01 NOTE — Progress Notes (Signed)
PT Cancellation Note  Patient Details Name: [name] MRN: [id number] DOB: [birth year]   Cancelled Treatment:    Reason Eval/Treat Not Completed: Medical issues which prohibited therapy, patient nauseated. Nursing reports that patient is up ad lib. Will check back tomorrow.   Claretha Cooper 07/01/2017, 3:32 PM Tresa Endo PT [phone]

## 2017-07-01 NOTE — Progress Notes (Signed)
  Echocardiogram 2D Echocardiogram has been performed.  Merrie Roof F 07/01/2017, 3:17 PM

## 2017-07-01 NOTE — Care Management Note (Signed)
Case Management Note  Patient Details  Name: [name] MRN: [id number] Date of Birth: Oct 18, 1960  Subjective/Objective: From [facility] following from prior admission but never received [place] orders. PT cons-await recc. Active w/[facility] home02.Palliative cons-await recc.                   Action/Plan:d/c plan return to ALF w/[facility].   Expected Discharge Date:  (unknown)               Expected Discharge Plan:  Assisted Living / Rest Home  In-House Referral:  Clinical Social Work  Discharge planning Services  CM Consult  Post Acute Care Choice:  [facility] home 02) Choice offered to:  Patient  DME Arranged:    DME Agency:     HH Arranged:    [place] Agency:     Status of Service:  In process, will continue to follow  If discussed at Long Length of Stay Meetings, dates discussed:    Additional Comments:  Dessa Phi, RN 07/01/2017, 4:04 PM

## 2017-07-01 NOTE — Progress Notes (Signed)
[facility] PROGRESS NOTE    Progress Note  [name]  EPP:[id number] DOB: 08/05/1960 DOA: 06/30/2017 PCP: [name], MD     Brief Narrative:   [name] is an 57 y.o. male past medical history significant for stage IV adenocarcinoma of the lung, COPD heart failure presents to the ED with shortness of breath to the day prior to admission angiogram of the chest showed acute PE patient also noted to have pleuritic chest pain.  Assessment/Plan:   Acute PE (pulmonary thromboembolism) (HCC) At the left main pulmonary artery extending into the left upper lobe question tumor thrombus. 2D echo is pending, we will transition him to Eliquis.  He relates his shortness of breath is slightly better compared to yesterday. Pain is improved.  Elevated cardiac biomarkers: Likely demand ischemia in the setting of PE, EKG showed no signs of ischemia.  Oxygen dependent COPD: Continue oxygen she has a mild leukocytosis which is probably stress demargination. We will consult hospice and palliative care.  Nicotine use: Counseling.  Left lung adenocarcinoma stage IV: Along with oncology as an outpatient.  Essential hypertension: Continue current home regimen.  Microcytic anemia: In the setting of malignancy question due to chronic disease check a B12 and RBC folate.  Chronic kidney disease stage III: Creatinine at baseline.  Chronic diastolic heart failure: Seems to be euvolemic continue current regimen.  Constipation: Continue current regimen, at bowel regimen for constipation.   DVT prophylaxisEliquis Family Communication:None Disposition Plan/Barrier to D/C: home probably in am Code Status:     Code Status Orders  (From admission, onward)        Start     Ordered   06/30/17 1015  Do not attempt resuscitation (DNR)  Continuous    Question Answer Comment  In the event of cardiac or respiratory ARREST Do not call a "code blue"   In the event of cardiac  or respiratory ARREST Do not perform Intubation, CPR, defibrillation or ACLS   In the event of cardiac or respiratory ARREST Use medication by any route, position, wound care, and other measures to relive pain and suffering. May use oxygen, suction and manual treatment of airway obstruction as needed for comfort.      06/30/17 1015    Code Status History    Date Active Date Inactive Code Status Order ID Comments User Context   06/25/2017 1117 06/29/2017 1603 Full Code [id number]  Shelly Coss, MD ED   06/21/2017 1044 06/23/2017 2044 DNR [id number]  Mariel Aloe, MD ED   06/05/2017 1602 06/10/2017 2253 Full Code [id number]  Purohit, Konrad Dolores, MD Inpatient   05/28/2017 1147 05/31/2017 2012 DNR [id number]  Nita Sells, MD ED   04/10/2017 1148 04/17/2017 1837 Full Code [id number]  Damita Lack, MD ED   03/09/2017 2359 03/19/2017 1931 Full Code [id number]  Purohit, Konrad Dolores, MD Inpatient   02/14/2017 0902 02/16/2017 1455 Full Code [id number]  Florencia Reasons, MD Inpatient   02/10/2017 0031 02/14/2017 0902 DNR [id number]  Norval Morton, MD ED   12/30/2016 2325 01/07/2017 2109 DNR [id number]  Elodia [name]., MD Inpatient   12/17/2016 0934 12/24/2016 1811 DNR [id number]  Thurnell Lose, MD Inpatient   12/16/2016 1649 12/17/2016 0934 Full Code [id number]  Charlynne Cousins, MD Inpatient    Advance Directive Documentation     Most Recent Value  Type of Advance Directive  Healthcare Power of Attorney, Living will  Pre-existing out of facility DNR order (yellow form or pink  MOST form)  -  "MOST" Form in Place?  -        IV Access:    Peripheral IV   Procedures and diagnostic studies:   Dg Chest 2 View  Result Date: 06/30/2017 CLINICAL DATA:  Initial evaluation for acute shortness of breath. History of lung cancer. EXAM: CHEST - 2 VIEW COMPARISON:  Prior radiograph from 06/25/2017. FINDINGS: Right-sided Port-A-Cath in place with tip overlying the proximal right atrium. Cardiac and mediastinal  silhouettes stable in size and contour, and remain within normal limits. Lungs normally inflated. Small moderate right pleural effusion is increased from previous. Scattered vascular congestion with crowding throughout the right lung, progressed from previous. Probable superimposed right basilar atelectasis. Blunting of the left costophrenic angle, which could reflect effusion and/or chronic pleural reaction/scarring, similar to previous. Left lung otherwise largely clear. Left perihilar architectural distortion with possible spiculated nodule, similar to previous. No pneumothorax. No acute osseous abnormality. IMPRESSION: 1. Small to moderate layering right pleural effusion, increased from previous, with worsened vascular congestion/crowding and probable atelectatic changes within the right lung. 2. Blunting of the left costophrenic angle, which could reflect small effusion and/or chronic pleural reaction/scarring, similar to previous. 3. Left perihilar architectural distortion with underlying spiculated nodular density, consistent with known lung malignancy. Electronically Signed   By: Jeannine Boga M.D.   On: 06/30/2017 04:36   Ct Angio Chest Pe W And/or Wo Contrast  Addendum Date: 06/30/2017   ADDENDUM REPORT: 06/30/2017 08:21 ADDENDUM: Critical Value/emergent results were called by telephone at the time of interpretation on 06/30/2017 at 8:21 am to [place] in the ED who verbally acknowledged these results. Electronically Signed   By: Genevie Ann M.D.   On: 06/30/2017 08:21   Result Date: 06/30/2017 CLINICAL DATA:  57 year old male with stage IV lung cancer and severe shortness of breath. EXAM: CT ANGIOGRAPHY CHEST WITH CONTRAST TECHNIQUE: Multidetector CT imaging of the chest was performed using the standard protocol during bolus administration of intravenous contrast. Multiplanar CT image reconstructions and MIPs were obtained to evaluate the vascular anatomy. CONTRAST:  167mL ISOVUE-370 IOPAMIDOL  (ISOVUE-370) INJECTION 76% COMPARISON:  Chest CT 04/13/2017, chest CTA 03/13/2017. FINDINGS: Cardiovascular: Good contrast bolus timing in the pulmonary arterial tree. There is no right lung pulmonary artery filling defect. 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 However, this is inseparable from abnormal peribronchial soft tissue tracking to the left hilum from the spiculated left upper lobe tumor. The vessel thrombus is primarily nonocclusive, although distal obliteration of the left upper lobe artery occurs but is stable since 03/13/2017. 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 Additionally, the right chest Port-A-Cath was injected. It is unclear whether there could be thrombus within the SVC and right atrium (series 5, image 184). This could be mixing artifact. Stable cardiac size. No pericardial effusion. Stable visible aorta. Mediastinum/Nodes: Loss of normal mediastinal fat planes, but no discrete mediastinal mass identified. Lungs/Pleura: Continued moderate size layering right pleural effusion, stable since March. Simple fluid density. Trace layering left pleural fluid. Stable major airway patency. Mildly regressed size and nodularity of the left upper lobe spiculated mass since March and 03/13/2017 (compare series 6, image 37 today to series 6, image 45 in February). However, contiguous opacity  from the mass to the left hilum persists. Underlying emphysema, severe on the left.  No new pulmonary opacity. Upper Abdomen: Small volume perihepatic free fluid along the periphery of the visible right hepatic lobe is new, but gastrohepatic ligament fluid as regressed and appears virtually resolved since  04/13/2017. Liver parenchyma, visible spleen, adrenal glands, kidneys, pancreas, and bowel appear within normal limits. Musculoskeletal: Stable visualized osseous structures. Heterogeneous sclerosis of T12 remain stable since February. Review of the MIP images confirms the above findings. IMPRESSION: 1. Positive for evidence of acute pulmonary thrombus at the left main pulmonary artery bifurcation and extending into the left upper lobe. Much of the visible thrombus is nonocclusive, and it might be tumor related, with adjacent peribronchial opacity tracking from the left upper lobe carcinoma to the hilum. 2. 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 No other pulmonary thrombus identified. 3. Mildly regressed size of the spiculated left upper lung mass since March. 4. Continued moderate size layering right pleural effusion. Underlying emphysema. 5. Small volume perihepatic ascites is new since March but the previously-seen gastrohepatic ligament fluid has regressed. Electronically Signed: By: Genevie Ann M.D. On: 06/30/2017 08:13     Medical Consultants:    None.  Anti-Infectives:   none  Subjective:    [name] he relates his reading is better but now is at baseline, his pain is controlled.  Objective:    Vitals:   07/01/17 0120 07/01/17 0143 07/01/17 0506 07/01/17 0730  BP:   114/62   Pulse:   65   Resp:   16   Temp:   97.6 F (36.4 C)   TempSrc:      SpO2: 100% 98% 100% 99%  Weight:      Height:        Intake/Output Summary (Last 24 hours) at 07/01/2017 0734 Last data filed at 07/01/2017 0600 Gross per 24 hour  Intake 698.36 ml  Output 3025 ml  Net -2326.64 ml   Filed Weights   06/30/17 0300  Weight: 53.1 kg (117 lb)    Exam: General exam: In no acute distress, cachectic Respiratory system: Good air movement and clear to auscultation. Cardiovascular system: S1 & S2 heard, RRR.  Gastrointestinal system: Abdomen is  nondistended, soft and nontender.  Central nervous system: Alert and oriented. No focal neurological deficits. Extremities: No pedal edema. Skin: No rashes, lesions or ulcers Psychiatry: Judgement and insight appear normal. Mood & affect appropriate.    Data Reviewed:    Labs: Basic Metabolic Panel: Recent Labs  Lab 06/26/17 0520 06/27/17 0511 06/28/17 0604 06/30/17 0329 07/01/17 0052  NA 140 141 141 142 142  K 4.4 4.4 4.5 4.1 3.8  CL 101 106 103 101 100*  CO2 32 30 31 33* 34*  GLUCOSE 137* 103* 127* 143* 130*  [name] 24* 21* 23* 29* 30*  CREATININE 1.65* 1.26* 1.19 1.40* 1.49*  CALCIUM 9.0 8.9 9.0 9.4 9.1   GFR Estimated Creatinine Clearance: 41.1 mL/min (A) (by C-G formula based on SCr of 1.49 mg/dL (H)). Liver Function Tests: Recent Labs  Lab 06/25/17 0750 06/27/17 0511 06/28/17 0604  AST 44* 33 27  ALT 47 34 31  ALKPHOS 115 85 86  BILITOT 0.3 0.5 0.2*  PROT 6.8 5.6* 5.7*  ALBUMIN 3.3* 2.6* 2.7*   No results for input(s): LIPASE, AMYLASE in the last 168 hours. No results for input(s): AMMONIA in the last 168 hours. Coagulation profile Recent Labs  Lab 06/30/17 [phone]  INR 1.01    CBC: Recent Labs  Lab 06/25/17 0750  06/27/17 0511 06/28/17 0604 06/29/17 0644 06/30/17 0329 07/01/17 0052  WBC 7.2   < > 12.4* 12.5* 15.8* 18.1* 15.2*  NEUTROABS 5.9  --   --   --   --  15.9*  --   HGB 8.8*   < > 7.8* 8.1* 8.4* 9.1* 8.3*  HCT 26.9*   < > 24.1* 24.2* 26.1* 28.3* 25.9*  MCV 98.2   < > 100.4* 101.3* 101.6* 102.2* 100.8*  PLT 229   < > 272 268 322 311 248   < > = values in this interval not displayed.   Cardiac Enzymes: Recent Labs  Lab 06/30/17 1258 06/30/17 1823 07/01/17 0052  TROPONINI 0.23* 0.28* 0.18*   BNP (last 3 results) No results for input(s): PROBNP in the last 8760 hours. CBG: No results for input(s): GLUCAP in the last 168 hours. D-Dimer: No results for input(s): DDIMER in the last 72 hours. Hgb A1c: No results for input(s): HGBA1C  in the last 72 hours. Lipid Profile: No results for input(s): CHOL, HDL, LDLCALC, TRIG, CHOLHDL, LDLDIRECT in the last 72 hours. Thyroid function studies: No results for input(s): TSH, T4TOTAL, T3FREE, THYROIDAB in the last 72 hours.  Invalid input(s): FREET3 Anemia work up: No results for input(s): VITAMINB12, FOLATE, FERRITIN, TIBC, IRON, RETICCTPCT in the last 72 hours. Sepsis Labs: Recent Labs  Lab 06/28/17 0604 06/29/17 0644 06/30/17 0329 07/01/17 0052  WBC 12.5* 15.8* 18.1* 15.2*   Microbiology Recent Results (from the past 240 hour(s))  Culture, blood (routine x 2)     Status: None   Collection Time: 06/21/17  8:05 AM  Result Value Ref Range Status   Specimen Description BLOOD PORTA CATH  Final   Special Requests   Final    BOTTLES DRAWN AEROBIC AND ANAEROBIC Blood Culture adequate volume Performed at [facility] [address], [place], [place] [address]    Culture   Final    NO GROWTH 5 DAYS Performed at [facility], [place] [address], [place], [place] [address]    Report Status 06/26/2017 FINAL  Final  Culture, blood (routine x 2)     Status: None   Collection Time: 06/21/17  8:10 AM  Result Value Ref Range Status   Specimen Description BLOOD LEFT ANTECUBITAL  Final   Special Requests   Final    BOTTLES DRAWN AEROBIC AND ANAEROBIC Blood Culture adequate volume Performed at [facility] [address] [place], [place] [address]    Culture   Final    NO GROWTH 5 DAYS Performed at [facility], [place] [address], [place], [place] [address]    Report Status 06/26/2017 FINAL  Final  MRSA PCR Screening     Status: None   Collection Time: 06/30/17  7:24 PM  Result Value Ref Range Status   MRSA by PCR NEGATIVE NEGATIVE Final    Comment:        The GeneXpert MRSA Assay (FDA approved for NASAL specimens only), is one component of a comprehensive MRSA colonization surveillance program. It is not intended to  diagnose MRSA infection nor to guide or monitor treatment for MRSA infections. Performed at [facility], [place] [address] [place],  [address]      Medications:   . amLODipine  10 mg Oral Daily  . escitalopram  5 mg Oral QHS  . fluconazole  100 mg Oral Daily  .  furosemide  20 mg Oral Daily  . guaiFENesin  1,200 mg Oral BID  . ipratropium-albuterol  3 mL Nebulization Q6H  . metoprolol tartrate  12.5 mg Oral BID  . pantoprazole  40 mg Oral BID  . polyethylene glycol  17 g Oral BID  . senna-docusate  1 tablet Oral QHS   Continuous Infusions: . heparin 800 Units/hr (07/01/17 0347)      LOS: 1 day   Charlynne Cousins  [facility] Pager [phone]  *Please refer to [email or web address], password [email or web address] to get updated schedule on who will round on this patient, as hospitalists switch teams weekly. If 7PM-7AM, please contact night-coverage at [email or web address], password [email or web address] for any overnight needs.  07/01/2017, 7:34 AM

## 2017-07-01 NOTE — Progress Notes (Signed)
ANTICOAGULATION CONSULT NOTE  Pharmacy Consult for Heparin  Indication: pulmonary embolus  No Known Allergies  Patient Measurements: Height: 5\' 11"  (180.3 cm) Weight: 117 lb (53.1 kg) IBW/kg (Calculated) : 75.3 Heparin Dosing Weight: actual weight  Vital Signs: Temp: 98.8 F (37.1 C) (06/10 2220) BP: 116/77 (06/10 2220) Pulse Rate: 95 (06/10 2220)  Labs: Recent Labs    06/28/17 0604 06/29/17 4536 06/30/17 0329 06/30/17 0852 06/30/17 1258 06/30/17 1823 07/01/17 0052 07/01/17 0254  HGB 8.1* 8.4* 9.1*  --   --   --  8.3*  --   HCT 24.2* 26.1* 28.3*  --   --   --  25.9*  --   PLT 268 322 311  --   --   --  248  --   APTT  --   --   --  29  --   --   --   --   LABPROT  --   --   --  13.2  --   --   --   --   INR  --   --   --  1.01  --   --   --   --   HEPARINUNFRC  --   --   --   --   --  0.89*  --  0.44  CREATININE 1.19  --  1.40*  --   --   --  1.49*  --   TROPONINI  --   --   --   --  0.23* 0.28* 0.18*  --     Estimated Creatinine Clearance: 41.1 mL/min (A) (by C-G formula based on SCr of 1.49 mg/dL (H)).   Medications:  Scheduled:  . amLODipine  10 mg Oral Daily  . escitalopram  5 mg Oral QHS  . fluconazole  100 mg Oral Daily  . furosemide  20 mg Oral Daily  . guaiFENesin  1,200 mg Oral BID  . ipratropium-albuterol  3 mL Nebulization Q6H  . metoprolol tartrate  12.5 mg Oral BID  . pantoprazole  40 mg Oral BID  . polyethylene glycol  17 g Oral BID  . senna-docusate  1 tablet Oral QHS   Infusions:  . heparin     PRN:   Assessment: 57 yo male with lung cancer just discharged from the hospital yesterday 06/29/17 who presents to the ED with worsening shortness of breath. CTa (+) for PE.  Pharmacy consulted to dose IV heparin.   Baseline PTT, INR: both wnl  No anticoagulants PTA per med rec  CBC: Hgb low 9.1 but improved from previous admit, Plts wnl  SCr elevated 1.4, CrCl ~43 ml/min  Most recent (first) heparin level elevated  No line or bleeding  issues per RN. Heparin infusing through port; lab draw via venipuncture Today, 6/11  0254 HL= 0.44 at goal, no infusion or bleeding issues per RN.  Goal of Therapy:  Heparin level 0.3-0.7 units/ml Monitor platelets by anticoagulation protocol: Yes   Plan:   Will increase heparin drip slightly to 800 units/hr to ensure stays in range  Check heparin level in 6-8 hrs  Daily heparin level and CBC  Monitor for signs/symptoms of bleeding or thrombosis  F/u for transition to PO agent  Dorrene German  07/01/2017, 3:24 AM

## 2017-07-01 NOTE — Progress Notes (Addendum)
[facility] for Heparin >> LMWH>>apixaban Indication: pulmonary embolus  No Known Allergies  Patient Measurements: Height: 5\' 11"  (180.3 cm) Weight: 117 lb (53.1 kg) IBW/kg (Calculated) : 75.3 Heparin Dosing Weight: actual weight  Vital Signs: Temp: 97.6 F (36.4 C) (06/11 0506) BP: 114/62 (06/11 0506) Pulse Rate: 65 (06/11 0506)  Labs: Recent Labs    06/29/17 0644 06/30/17 0329 06/30/17 0852 06/30/17 1258 06/30/17 1823 07/01/17 0052 07/01/17 0254  HGB 8.4* 9.1*  --   --   --  8.3*  --   HCT 26.1* 28.3*  --   --   --  25.9*  --   PLT 322 311  --   --   --  248  --   APTT  --   --  29  --   --   --   --   LABPROT  --   --  13.2  --   --   --   --   INR  --   --  1.01  --   --   --   --   HEPARINUNFRC  --   --   --   --  0.89*  --  0.44  CREATININE  --  1.40*  --   --   --  1.49*  --   TROPONINI  --   --   --  0.23* 0.28* 0.18*  --     Estimated Creatinine Clearance: 41.1 mL/min (A) (by C-G formula based on SCr of 1.49 mg/dL (H)).   Assessment: 57 yo male with lung cancer just discharged from the hospital yesterday 06/29/17 who presents to the ED with worsening shortness of breath. CTa (+) for PE.  Pharmacy consulted to dose IV heparin.  Today, 6/11 -transition to LMWH  Goal of Therapy:  Heparin level 0.3-0.7 units/ml Monitor platelets by anticoagulation protocol: Yes   Plan: Dc heparin drip and labs LMWH 1 mg/kg sq q12h = 55 mg q12 Could transition to 1.5 mg/kg/day = 80 mg daily at discharge for convenience if desired.  Eudelia Bunch, Pharm.D. [phone] 07/01/2017 7:50 AM   Addendum: to transition to Apixaban (pt refused LMWH) Plan: Eliquis 10 mg po bid x 7 days then Eliquis 5 mg po bid Will provide education and 30 day free card  Eudelia Bunch, Pharm.D. [phone] 07/01/2017 10:30 AM  Educated pt and gave pt 30 day free Eliquis card Eudelia Bunch, Pharm.D. [phone] 07/01/2017 2:05 PM

## 2017-07-01 NOTE — Discharge Instructions (Signed)
Information on my medicine - ELIQUIS (apixaban)  This medication education was reviewed with me or my healthcare representative as part of my discharge preparation.  The pharmacist that spoke with me during my hospital stay was:  Eudelia Bunch, [facility]  Why was Eliquis prescribed for you? Eliquis was prescribed to treat blood clots that may have been found in the veins of your legs (deep vein thrombosis) or in your lungs (pulmonary embolism) and to reduce the risk of them occurring again.  What do You need to know about Eliquis ? The starting dose is 10 mg (two 5 mg tablets) taken TWICE daily for the FIRST SEVEN (7) DAYS, then on Tuesday 07/08/17  the dose is reduced to ONE 5 mg tablet taken TWICE daily.  Eliquis may be taken with or without food.   Try to take the dose about the same time in the morning and in the evening. If you have difficulty swallowing the tablet whole please discuss with your pharmacist how to take the medication safely.  Take Eliquis exactly as prescribed and DO NOT stop taking Eliquis without talking to the doctor who prescribed the medication.  Stopping may increase your risk of developing a new blood clot.  Refill your prescription before you run out.  After discharge, you should have regular check-up appointments with your healthcare provider that is prescribing your Eliquis.    What do you do if you miss a dose? If a dose of ELIQUIS is not taken at the scheduled time, take it as soon as possible on the same day and twice-daily administration should be resumed. The dose should not be doubled to make up for a missed dose.  Important Safety Information A possible side effect of Eliquis is bleeding. You should call your healthcare provider right away if you experience any of the following: ? Bleeding from an injury or your nose that does not stop. ? Unusual colored urine (red or dark brown) or unusual colored stools (red or black). ? Unusual bruising for  unknown reasons. ? A serious fall or if you hit your head (even if there is no bleeding).  Some medicines may interact with Eliquis and might increase your risk of bleeding or clotting while on Eliquis. To help avoid this, consult your healthcare provider or pharmacist prior to using any new prescription or non-prescription medications, including herbals, vitamins, non-steroidal anti-inflammatory drugs (NSAIDs) and supplements.  This website has more information on Eliquis (apixaban): http://www.eliquis.com/eliquis/home

## 2017-07-02 ENCOUNTER — Inpatient Hospital Stay (HOSPITAL_COMMUNITY): Payer: Medicaid Other

## 2017-07-02 DIAGNOSIS — Z7189 Other specified counseling: Secondary | ICD-10-CM

## 2017-07-02 DIAGNOSIS — R1084 Generalized abdominal pain: Secondary | ICD-10-CM

## 2017-07-02 DIAGNOSIS — Z515 Encounter for palliative care: Secondary | ICD-10-CM

## 2017-07-02 LAB — FOLATE RBC
Folate, Hemolysate: 412.5 ng/mL
Folate, RBC: 1599 ng/mL (ref >498)
Hematocrit: 25.8 % — ABNORMAL LOW (ref 37.5–51.0)

## 2017-07-02 MED ORDER — METOPROLOL TARTRATE 25 MG PO TABS
12.5000 mg | ORAL_TABLET | Freq: Once | ORAL | Status: AC
  Administered 2017-07-02: 12.5 mg via ORAL
  Filled 2017-07-02: qty 1

## 2017-07-02 MED ORDER — SENNOSIDES-DOCUSATE SODIUM 8.6-50 MG PO TABS
2.0000 | ORAL_TABLET | Freq: Every day | ORAL | Status: DC
  Administered 2017-07-03: 2 via ORAL
  Filled 2017-07-02: qty 2

## 2017-07-02 MED ORDER — APIXABAN 5 MG PO TABS
10.0000 mg | ORAL_TABLET | Freq: Once | ORAL | Status: AC
  Administered 2017-07-02: 10 mg via ORAL
  Filled 2017-07-02: qty 2

## 2017-07-02 MED ORDER — PANTOPRAZOLE SODIUM 40 MG PO TBEC
40.0000 mg | DELAYED_RELEASE_TABLET | Freq: Once | ORAL | Status: AC
  Administered 2017-07-02: 40 mg via ORAL
  Filled 2017-07-02: qty 1

## 2017-07-02 MED ORDER — FLUCONAZOLE 100 MG PO TABS
100.0000 mg | ORAL_TABLET | Freq: Once | ORAL | Status: AC
  Administered 2017-07-02: 100 mg via ORAL
  Filled 2017-07-02: qty 1

## 2017-07-02 MED ORDER — FUROSEMIDE 20 MG PO TABS
20.0000 mg | ORAL_TABLET | Freq: Once | ORAL | Status: AC
  Administered 2017-07-02: 20 mg via ORAL
  Filled 2017-07-02: qty 1

## 2017-07-02 NOTE — Care Management Note (Signed)
Case Management Note  Patient Details  Name: [name] MRN: [id number] Date of Birth: 1960/03/28  Subjective/Objective:  PT recc HHPT,rollator. [facility]. [place] dme rep Santiago Glad will deliver rollator rm if qualifies. Already active w/[facility] for home 02. CSW following for return back to [facility].                  Action/Plan:d/c ALF/HHC/dme   Expected Discharge Date:  (unknown)               Expected Discharge Plan:  Assisted Living / Rest Home  In-House Referral:  Clinical Social Work  Discharge planning Services  CM Consult  Post Acute Care Choice:  [facility] home [phone]) Choice offered to:  Patient  DME Arranged:  [facility] rolling with seat DME Agency:     HH Arranged:  PT HH Agency:  [facility]  Status of Service:  In process, will continue to follow  If discussed at Long Length of Stay Meetings, dates discussed:    Additional Comments:  Dessa Phi, RN 07/02/2017, 3:50 PM

## 2017-07-02 NOTE — Consult Note (Signed)
Consultation Note Date: 07/02/2017   Patient Name: [name]  DOB: 1960-07-23  MRN: [id number]  Age / Sex: 57 y.o., male  PCP: [name], MD Referring Physician: Florencia Reasons, MD  Reason for Consultation: Establishing goals of care  HPI/Patient Profile: 57 y.o. male  with past medical history of stage IV lung cancer, COPD, HTN, CHF, numerous admissions (9 admissions and 7 ED visits in past 6 months) admitted on 06/30/2017 with worsening SOB and found to have new PE on top of his lung cancer and COPD as well as CHF. With chronic SOB and pain. Previously seen by Micheline Rough, MD and goals have been "to feel better and live as long as I can" - will reassess today. He has also been interested in discussing future plans for cancer treatment with [name] with last chemotherapy infusion 5/14 with Alimta planned for every 3 weeks. He has been hospitalized more days than not these past few months which I anticipate will greatly impact his ability to continue chemotherapy.   Clinical Assessment and Goals of Care: I met today with [name] today. He has recently met with palliative team on previous admissions. [name] is very friendly and appreciative of visit. He is aware of the poor prognosis and expectations of his stage IV lung cancer BUT is hopeful to continue appointments/follow up for continued chemotherapy with [name]. We discussed his numerous hospitalizations and that this may impede his ability to continue treatment if he stays too ill. He does confirm DNR and that being on machines is not living. He is fearful of suffering with death. I reassured him to goals for comfort at EOL and that we would be able to support his mother and family when this time comes (he was reassured by this).   He also talks of his SOB and that this is "unchanged" but is not interested currently in med changes. He does not  seem to understand that his increasing SOB may likely be a baseline for him and may not be reversible. He complains mainly of abd pain and had BM yesterday but abd is firm with decreased bowel sounds. Discussed taking miralax, increased Senokot, and discussed imaging possibilities with Dr. Erlinda Hong.   Primary Decision Maker PATIENT, HCPOA mother Sharbel [name] (Living Will in electronic chart)    SUMMARY OF RECOMMENDATIONS   - DNR confirmed - Plans to f/u with [name] for continued cancer treatment  Code Status/Advance Care Planning:  DNR   Symptom Management:   Pain: OxyIR 10 mg po every 4 hrs prn. Pain in abd likely constipation.  Constipation: Increased Senokot to 2 pills daily. Encouraged to accept Miralax today as prescribed BID.   SOB: OxyIR 10 mg po every 4 hrs prn.   Palliative Prophylaxis:   Bowel Regimen, Delirium Protocol, Frequent Pain Assessment and Oral Care  Additional Recommendations (Limitations, Scope, Preferences):  Avoid Hospitalization and Full Scope Treatment  Psycho-social/Spiritual:   Desire for further Chaplaincy support:yes  Additional Recommendations: Education on Hospice  Prognosis:   <  6 months very likely with advanced cancer, increasing symptoms and complications, and co-morbidities.   Discharge Planning: Return to ALF with palliative care.      Primary Diagnoses: Present on Admission: . Tobacco abuse . Adenocarcinoma of left lung, stage 4 ([place]) . COPD (chronic obstructive pulmonary disease) ([place]) . SOB (shortness of breath) . Chronic respiratory failure with hypoxia ([place]) . Pulmonary embolism ([place])   I have reviewed the medical record, interviewed the patient and family, and examined the patient. The following aspects are pertinent.  Past Medical History:  Diagnosis Date  . Abdominal pain 06/04/2016  . Adenocarcinoma of left lung, stage 4 ([place]) 05/02/2016  . Alcohol abuse   . Bronchitis due to tobacco use ([place])   . Cancer  ([place])    Lung  . COPD (chronic obstructive pulmonary disease) ([place])   . Dehydration 06/04/2016  . Encounter for antineoplastic chemotherapy 05/02/2016  . Gastritis   . Goals of care, counseling/discussion 05/02/2016  . Hematemesis   . HTN (hypertension) 10/30/2016  . Recurrent upper respiratory infection (URI)   . Seizures ([place]) 05/2011   new onset  . Shortness of breath    Social History   Socioeconomic History  . Marital status: Single    Spouse name: Not on file  . Number of children: Not on file  . Years of education: Not on file  . Highest education level: Not on file  Occupational History  . Not on file  Social Needs  . Financial resource strain: Not on file  . Food insecurity:    Worry: Not on file    Inability: Not on file  . Transportation needs:    Medical: Not on file    Non-medical: Not on file  Tobacco Use  . Smoking status: Current Some Day Smoker    Packs/day: 0.10    Years: 30.00    Pack years: 3.00    Types: Cigarettes  . Smokeless tobacco: Never Used  . Tobacco comment: 1-2 cig a day   Substance and Sexual Activity  . Alcohol use: No    Frequency: Never  . Drug use: No  . Sexual activity: Yes  Lifestyle  . Physical activity:    Days per week: Not on file    Minutes per session: Not on file  . Stress: Not on file  Relationships  . Social connections:    Talks on phone: Not on file    Gets together: Not on file    Attends religious service: Not on file    Active member of club or organization: Not on file    Attends meetings of clubs or organizations: Not on file    Relationship status: Not on file  Other Topics Concern  . Not on file  Social History Narrative  . Not on file   Family History  Problem Relation Age of Onset  . Cancer Father   . Diabetes Mellitus II Mother    Scheduled Meds: . amLODipine  10 mg Oral Daily  . apixaban  10 mg Oral BID  . [START ON 07/08/2017] apixaban  5 mg Oral BID  . escitalopram  5 mg Oral QHS  .  feeding supplement  1 Container Oral Q24H  . feeding supplement (ENSURE ENLIVE)  237 mL Oral BID BM  . fluconazole  100 mg Oral Daily  . furosemide  20 mg Oral Daily  . guaiFENesin  1,200 mg Oral BID  . ipratropium-albuterol  3 mL Nebulization Q6H  . metoprolol tartrate  12.5 mg Oral BID  . pantoprazole  40 mg Oral BID  . polyethylene glycol  17 g Oral BID  . senna-docusate  1 tablet Oral QHS   Continuous Infusions: PRN Meds:.morphine injection, ondansetron, oxyCODONE No Known Allergies Review of Systems  Constitutional: Positive for activity change and fatigue. Negative for appetite change.  Respiratory: Positive for shortness of breath.   Gastrointestinal: Positive for abdominal pain.    Physical Exam  Constitutional: He is oriented to person, place, and time. He appears well-developed. He has a sickly appearance.  Thin, frail   HENT:  Head: Normocephalic and atraumatic.  Cardiovascular: Normal rate.  Pulmonary/Chest: Effort normal. No accessory muscle usage. No tachypnea. No respiratory distress.  Abdominal: He exhibits distension. Bowel sounds are decreased. There is tenderness.  Neurological: He is alert and oriented to person, place, and time.  Nursing note and vitals reviewed.   Vital Signs: BP 113/70 (BP Location: Right Arm)   Pulse 61   Temp 98.3 F (36.8 C) (Oral)   Resp 16   Ht 5' 11"  (1.803 m)   Wt 53.1 kg (117 lb)   SpO2 100%   BMI 16.32 kg/m  Pain Scale: 0-10   Pain Score: Asleep   SpO2: SpO2: 100 % O2 Device:SpO2: 100 % O2 Flow Rate: .O2 Flow Rate (L/min): 2 L/min  IO: Intake/output summary:   Intake/Output Summary (Last 24 hours) at 07/02/2017 0818 Last data filed at 07/02/2017 0708 Gross per 24 hour  Intake 820 ml  Output 1525 ml  Net -705 ml    LBM: Last BM Date: 07/01/17 Baseline Weight: Weight: 53.1 kg (117 lb) Most recent weight: Weight: 53.1 kg (117 lb)     Palliative Assessment/Data: 50%    Time Total: 80 min  Greater than  50%  of this time was spent counseling and coordinating care related to the above assessment and plan.  Signed by: Vinie Sill, NP Palliative Medicine Team Pager # [phone] (M-F 8a-5p) Team Phone # [phone] (Nights/Weekends)

## 2017-07-02 NOTE — Progress Notes (Signed)
[employer] rep Santiago Glad will discuss the rollator w/patient since he may have already received a rw-she will confirm.

## 2017-07-02 NOTE — Evaluation (Signed)
Physical Therapy Evaluation Patient Details Name: [name] MRN: [id number] DOB: 1960/10/08 Today's Date: 07/02/2017   History of Present Illness  57 yo male admitted with acute PE. Hx of COPD, stage IV lung ca with mets, ETOH abuse, CHF  Clinical Impression  On eval, pt was Supervision level assist for mobility. He walked ~200 feet with use of a rollator. O2 sat 94% on 3L [place] O2, dyspnea 2/4 during ambulation. Used rollator to aid with energy conservation and stability. Pt felt rollator assisted greatly with his mobility. If at all possible, recommend HHPT f/u and 4 wheeled rolling walker for ambulation safely. Will continue to follow and progress activity as tolerated..     Follow Up Recommendations Home health PT    Equipment Recommendations  4 wheeled rolling walker    Recommendations for Other Services       Precautions / Restrictions Precautions Precautions: Fall Precaution Comments: monitor O2 Restrictions Weight Bearing Restrictions: No      Mobility  Bed Mobility Overal bed mobility: Modified Independent                Transfers Overall transfer level: Needs assistance Equipment used: 4-wheeled walker Transfers: Sit to/from Stand Sit to Stand: Supervision         General transfer comment: for safety. cues hand placement, safe use of rollator  Ambulation/Gait Ambulation/Gait assistance: Supervision Ambulation Distance (Feet): 200 Feet Assistive device: 4-wheeled walker       General Gait Details: for safety. O2 sat 94% on 3L [place] O2. Used rollator to aide with energy conservation and improve stability.   Stairs            Wheelchair Mobility    Modified Rankin (Stroke Patients Only)       Balance Overall balance assessment: Mild deficits observed, not formally tested                                           Pertinent Vitals/Pain Pain Assessment: Faces Faces Pain Scale: No hurt    Home Living Family/patient  expects to be discharged to:: Assisted living                      Prior Function Level of Independence: Independent         Comments: Feels unsteady and wants a rollator     Hand Dominance        Extremity/Trunk Assessment   Upper Extremity Assessment Upper Extremity Assessment: Overall WFL for tasks assessed    Lower Extremity Assessment Lower Extremity Assessment: Generalized weakness    Cervical / Trunk Assessment Cervical / Trunk Assessment: Normal  Communication   Communication: No difficulties  Cognition Arousal/Alertness: Awake/alert Behavior During Therapy: WFL for tasks assessed/performed Overall Cognitive Status: Within Functional Limits for tasks assessed                                        General Comments      Exercises     Assessment/Plan    PT Assessment Patient needs continued PT services  PT Problem List Decreased activity tolerance;Decreased mobility       PT Treatment Interventions Gait training;Therapeutic activities;Therapeutic exercise;Functional mobility training;DME instruction;Balance training;Patient/family education    PT Goals (Current goals can be found in the Care  Plan section)  Acute Rehab PT Goals Patient Stated Goal: to get a rollator to help with ambulation PT Goal Formulation: With patient Time For Goal Achievement: 07/16/17 Potential to Achieve Goals: Good    Frequency Min 3X/week   Barriers to discharge        Co-evaluation               AM-PAC PT "6 Clicks" Daily Activity  Outcome Measure Difficulty turning over in bed (including adjusting bedclothes, sheets and blankets)?: None Difficulty moving from lying on back to sitting on the side of the bed? : None Difficulty sitting down on and standing up from a chair with arms (e.g., wheelchair, bedside commode, etc,.)?: A Little Help needed moving to and from a bed to chair (including a wheelchair)?: A Little Help needed walking  in hospital room?: A Little Help needed climbing 3-5 steps with a railing? : A Little 6 Click Score: 20    End of Session Equipment Utilized During Treatment: Gait belt;Oxygen Activity Tolerance: Patient tolerated treatment well Patient left: in bed;with call bell/phone within reach   PT Visit Diagnosis: Unsteadiness on feet (R26.81)    Time: 0981-1914 PT Time Calculation (min) (ACUTE ONLY): 20 min   Charges:   PT Evaluation $PT Eval Moderate Complexity: 1 Mod     PT G Codes:         Weston Anna, MPT Pager: [phone]

## 2017-07-02 NOTE — Progress Notes (Signed)
PROGRESS NOTE  [name] EAV:[id number] DOB: 1960-03-20 DOA: 06/30/2017 PCP: [name], MD  HPI/Recap of past 24 hours:  C/o abdominal fullness and tightness No bm He is on 2.5liter o2 which is baseline, he denies chest pain, no cough, no fever, no edema  Assessment/Plan: Principal Problem:   PE (pulmonary thromboembolism) ([place]) Active Problems:   Tobacco abuse   Adenocarcinoma of left lung, stage 4 ([facility])   COPD (chronic obstructive pulmonary disease) (H[facility])   SOB (shortness of breath)   Chronic respiratory failure with hypoxia ([facility])   Pulmonary embolism ([facility])   Acute PE (pulmonary thromboembolism) ([place]) At the left main pulmonary artery extending into the left upper lobe question tumor thrombus. Elevated troponin , likely due to PE, troponin peaked at 0.28, trending down 2D echo lvef wnl, grade I diastolic dysfunction, Right ventricle:  The cavity size was normal. Wall thickness was normal. Systolic function was normal   The main pulmonary artery was normal-sized. Systolic pressure was within the normal range -I have reviewed tele, unremarkable, will d/c tele. He was started on heparin drip, now on eliquis  Leukocytosis:  Stress related? 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 b12/folate/tsh unremarkable, he is followed by hematology/oncology - Hgb stable  Essential hypertension - he is on lopressor  Constipation:stool softener   Malnutrition: Body mass index is 18.7 kg/m.  H/o erosive gastropathy/gastritis  Recent EGD by [facility] Dr Fuller Plan on 11/2016 No acute issues, Continue ppi bid.    FTT: palliative care following From ALF  Code Status: DNR  Family Communication: patient   Disposition Plan: hopefully d/c to alf tomorrow , pending abdominal US   Consultants:  Palliative care  Procedures:  none  Antibiotics:  none   Objective: BP 113/70 (BP Location: Right Arm)   Pulse 61   Temp 98.3 F (36.8 C) (Oral)   Resp 16   Ht 5\' 11"  (1.803 m)   Wt 53.1 kg (117 lb)   SpO2 98%   BMI 16.32 kg/m   Intake/Output Summary (Last 24 hours) at 07/02/2017 1304 Last data filed at 07/02/2017 1101 Gross per 24 hour  Intake 460 ml  Output 1575 ml  Net -1115 ml   Filed Weights   06/30/17 0300  Weight: 53.1 kg (117 lb)    Exam: Patient is examined daily including today on 07/02/2017, exams remain the same as of yesterday except that has changed    General:  Chronically ill, NAD  Cardiovascular: RRR  Respiratory: diminished, no wheezing, no rhonchi  Abdomen: abdominal seems to be tight and mildly distended, mild diffuse tender, no rebound, positive BS  Musculoskeletal: No Edema  Neuro: alert, oriented   Data Reviewed: Basic Metabolic Panel: Recent Labs  Lab 06/26/17 0520 06/27/17 0511 06/28/17 0604 06/30/17  0329 07/01/17 0052  NA 140 141 141 142 142  K 4.4 4.4 4.5 4.1 3.8  CL 101 106 103 101 100*  CO2 32 30 31 33* 34*  GLUCOSE 137* 103* 127* 143* 130*  BUN 24* 21* 23* 29* 30*  CREATININE 1.65* 1.26* 1.19 1.40* 1.49*  CALCIUM 9.0 8.9 9.0 9.4 9.1   Liver Function Tests: Recent Labs  Lab 06/27/17 0511 06/28/17 0604  AST 33 27  ALT 34 31  ALKPHOS 85 86  BILITOT 0.5 0.2*    PROT 5.6* 5.7*  ALBUMIN 2.6* 2.7*   No results for input(s): LIPASE, AMYLASE in the last 168 hours. No results for input(s): AMMONIA in the last 168 hours. CBC: Recent Labs  Lab 06/27/17 0511 06/28/17 0604 06/29/17 0644 06/30/17 0329 07/01/17 0052  WBC 12.4* 12.5* 15.8* 18.1* 15.2*  NEUTROABS  --   --   --  15.9*  --   HGB 7.8* 8.1* 8.4* 9.1* 8.3*  HCT 24.1* 24.2* 26.1* 28.3* 25.9*  MCV 100.4* 101.3* 101.6* 102.2* 100.8*  PLT 272 268 322 311 248   Cardiac Enzymes:   Recent Labs  Lab 06/30/17 1258 06/30/17 1823 07/01/17 0052  TROPONINI 0.23* 0.28* 0.18*   BNP (last 3 results) Recent Labs    06/24/17 0428 06/25/17 0750 06/30/17 0812  BNP 54.5 89.9 224.5*    ProBNP (last 3 results) No results for input(s): PROBNP in the last 8760 hours.  CBG: No results for input(s): GLUCAP in the last 168 hours.  Recent Results (from the past 240 hour(s))  MRSA PCR Screening     Status: None   Collection Time: 06/30/17  7:24 PM  Result Value Ref Range Status   MRSA by PCR NEGATIVE NEGATIVE Final    Comment:        The GeneXpert MRSA Assay (FDA approved for NASAL specimens only), is one component of a comprehensive MRSA colonization surveillance program. It is not intended to diagnose MRSA infection nor to guide or monitor treatment for MRSA infections. Performed at [facility], [place] [address] [place], [place] [address]      Studies: No results found.  Scheduled Meds: . amLODipine  10 mg Oral Daily  . apixaban  10 mg Oral BID  . [START ON 07/08/2017] apixaban  5 mg Oral BID  . escitalopram  5 mg Oral QHS  . feeding supplement  1 Container Oral Q24H  . feeding supplement (ENSURE ENLIVE)  237 mL Oral BID BM  . fluconazole  100 mg Oral Daily  . furosemide  20 mg Oral Daily  . guaiFENesin  1,200 mg Oral BID  . ipratropium-albuterol  3 mL Nebulization Q6H  . metoprolol tartrate  12.5 mg Oral BID  . pantoprazole  40 mg Oral BID  . polyethylene  glycol  17 g Oral BID  . senna-docusate  2 tablet Oral QHS    Continuous Infusions:   Time spent: 29mins I have personally reviewed and interpreted on  07/02/2017 daily labs, tele strips, imagings as discussed above under date review session and assessment and plans.  I reviewed all nursing notes, pharmacy notes, consult note, vitals, pertinent old records  I have discussed plan of care as described above with RN , patient  on 07/02/2017   Florencia Reasons MD, PhD  [facility] Pager [phone]. If 7PM-7AM, please contact night-coverage at [email or web address], password [facility] 07/02/2017, 1:04 PM  LOS: 2 days

## 2017-07-03 ENCOUNTER — Telehealth: Payer: Self-pay | Admitting: *Deleted

## 2017-07-03 ENCOUNTER — Inpatient Hospital Stay (HOSPITAL_COMMUNITY): Payer: Medicaid Other

## 2017-07-03 LAB — COMPREHENSIVE METABOLIC PANEL
ALK PHOS: 87 U/L (ref 38–126)
ALT: 20 U/L (ref 17–63)
AST: 25 U/L (ref 15–41)
Albumin: 2.8 g/dL — ABNORMAL LOW (ref 3.5–5.0)
Anion gap: 9 (ref 5–15)
BILIRUBIN TOTAL: 0.6 mg/dL (ref 0.3–1.2)
BUN: 18 mg/dL (ref 6–20)
CALCIUM: 8.6 mg/dL — AB (ref 8.9–10.3)
CO2: 34 mmol/L — AB (ref 22–32)
CREATININE: 1.37 mg/dL — AB (ref 0.61–1.24)
Chloride: 98 mmol/L — ABNORMAL LOW (ref 101–111)
GFR calc non Af Amer: 56 mL/min — ABNORMAL LOW (ref >60)
Glucose, Bld: 90 mg/dL (ref 65–99)
Potassium: 3.2 mmol/L — ABNORMAL LOW (ref 3.5–5.1)
SODIUM: 141 mmol/L (ref 135–145)
TOTAL PROTEIN: 5.6 g/dL — AB (ref 6.5–8.1)

## 2017-07-03 LAB — CBC WITH DIFFERENTIAL/PLATELET
BASOS PCT: 0 %
Basophils Absolute: 0 10*3/uL (ref 0.0–0.1)
EOS ABS: 0 10*3/uL (ref 0.0–0.7)
EOS PCT: 0 %
HEMATOCRIT: 25.2 % — AB (ref 39.0–52.0)
HEMOGLOBIN: 8.2 g/dL — AB (ref 13.0–17.0)
LYMPHS PCT: 13 %
Lymphs Abs: 1.7 10*3/uL (ref 0.7–4.0)
MCH: 33.7 pg (ref 26.0–34.0)
MCHC: 32.5 g/dL (ref 30.0–36.0)
MCV: 103.7 fL — AB (ref 78.0–100.0)
MONOS PCT: 12 %
Monocytes Absolute: 1.5 10*3/uL — ABNORMAL HIGH (ref 0.1–1.0)
NEUTROS ABS: 9.6 10*3/uL — AB (ref 1.7–7.7)
NEUTROS PCT: 75 %
Platelets: 201 10*3/uL (ref 150–400)
RBC: 2.43 MIL/uL — ABNORMAL LOW (ref 4.22–5.81)
RDW: 16.7 % — ABNORMAL HIGH (ref 11.5–15.5)
WBC: 12.8 10*3/uL — ABNORMAL HIGH (ref 4.0–10.5)

## 2017-07-03 MED ORDER — APIXABAN 5 MG PO TABS: 10.0000 mg | ORAL_TABLET | Freq: Two times a day (BID) | ORAL | 0 refills | Status: DC

## 2017-07-03 MED ORDER — SODIUM CHLORIDE 0.9% FLUSH: 10.0000 mL | INTRAVENOUS | Status: DC | PRN

## 2017-07-03 MED ORDER — APIXABAN 5 MG PO TABS: 5.0000 mg | ORAL_TABLET | Freq: Two times a day (BID) | ORAL | 0 refills | Status: DC

## 2017-07-03 MED ORDER — IOPAMIDOL (ISOVUE-300) INJECTION 61%
INTRAVENOUS | Status: AC
  Filled 2017-07-03: qty 30

## 2017-07-03 MED ORDER — POTASSIUM CHLORIDE CRYS ER 20 MEQ PO TBCR
40.0000 meq | EXTENDED_RELEASE_TABLET | Freq: Once | ORAL | Status: AC
  Administered 2017-07-03: 40 meq via ORAL
  Filled 2017-07-03: qty 2

## 2017-07-03 MED ORDER — HEPARIN SOD (PORK) LOCK FLUSH 100 UNIT/ML IV SOLN
500.0000 [IU] | INTRAVENOUS | Status: AC | PRN
  Administered 2017-07-03: 500 [IU]

## 2017-07-03 NOTE — Progress Notes (Signed)
Nursing report given to nurse Tonia Ghent at [facility]. Pt is waiting for ambulance transport.

## 2017-07-03 NOTE — Care Management Note (Signed)
Case Management Note  Patient Details  Name: LECIL TAPP MRN: [id number] Date of Birth: [birth year]  Subjective/Objective:                    Action/Plan:   Expected Discharge Date:  07/03/17               Expected Discharge Plan:  Assisted Living / Rest Home  In-House Referral:  Clinical Social Work  Discharge planning Services  CM Consult  Post Acute Care Choice:  [facility] home 02) Choice offered to:  Patient  DME Arranged:  Walker rolling with seat DME Agency:     HH Arranged:  PT, RN [place] Agency:  [facility]  Status of Service:  Completed, signed off  If discussed at [place] of Stay Meetings, dates discussed:    Additional Comments:  Dessa Phi, RN 07/03/2017, 5:21 PM

## 2017-07-03 NOTE — Telephone Encounter (Signed)
FYI "Ivan Anchors NP with [facility] calling about appointments for this patient in room 1408.  He's been admitted more than not this year.  Could we schedule something as he says he's missed appointments with [name].  He could be discharged soon."  Sherri notified patient is currently scheduled 10:00 am, 07-15-2017 for Lab,Flush,provider F/U visit and infusion.  Sherri unable to confirm patient appointment awareness or demographic changes to ensure receipt of appointment reminders at this time.  D/C summary may  Be used.

## 2017-07-03 NOTE — NC FL2 (Signed)
[place] LEVEL OF CARE SCREENING TOOL     IDENTIFICATION  Patient Name: [name] Birthdate: 05-19-1960 Sex: male Admission Date (Current Location): 06/30/2017  [facility] and [place] Number:  Herbalist and Address:  [facility],  [place] [address], [place]      Provider Number: [id number]  Attending Physician Name and Address:  Florencia Reasons, MD  Relative Name and Phone Number:       Current Level of Care: Hospital Recommended Level of Care: [place] Prior Approval Number:    Date Approved/Denied:   PASRR Number:    Discharge Plan: Other (Comment)(Assisted Living Facility)    Current Diagnoses: Patient Active Problem List   Diagnosis Date Noted  . PE (pulmonary thromboembolism) ([place]) 06/30/2017  . Pulmonary embolism ([place]) 06/30/2017  . AKI (acute kidney injury) ([place]) 06/25/2017  . Dyspnea 06/21/2017  . Chronic combined systolic (congestive) and diastolic (congestive) heart failure ([place])   . Constipation 06/07/2017  . Pain   . Chronic respiratory failure with hypoxia ([place]) 04/01/2017  . Leucocytosis 03/16/2017  . CKD (chronic kidney disease), stage III ([place]) 03/12/2017  . Malnutrition of moderate degree 03/10/2017  . COPD exacerbation ([place]) 03/09/2017  . Anemia 02/07/2017  . Severe malnutrition ([place]) 01/03/2017  . DOE (dyspnea on exertion) 12/30/2016  . Nausea   . Hypervolemia   . HCAP (healthcare-associated pneumonia) 12/19/2016  . SOB (shortness of breath) 12/19/2016  . Erosive gastropathy 12/18/2016  . Gastritis 12/18/2016  . Hematemesis 12/16/2016  . Intractable nausea and vomiting 12/16/2016  . HTN (hypertension) 10/30/2016  . Chronic obstructive pulmonary disease ([place]) 09/02/2016  . COPD (chronic obstructive pulmonary disease) ([place]) 08/19/2016  . Dehydration 06/04/2016  . Abdominal pain 06/04/2016  . 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 Adenocarcinoma of left lung, stage 4 ([place]) 05/02/2016   . Goals of care, counseling/discussion 05/02/2016  . Encounter for antineoplastic chemotherapy 05/02/2016  . Tobacco abuse 05/30/2011  . Alcohol abuse 05/30/2011  . Seizure ([place]) 05/28/2011    Orientation RESPIRATION BLADDER Height & Weight     Self, Time, Situation, Place  O2 Continent Weight: 117 lb (53.1 kg) Height:  5\' 11"  (180.3 cm)  BEHAVIORAL SYMPTOMS/MOOD NEUROLOGICAL BOWEL NUTRITION STATUS      Continent Diet(Heart)  AMBULATORY STATUS COMMUNICATION OF NEEDS Skin   Limited Assist Verbally Normal                       Personal Care Assistance Level of Assistance  Bathing, Feeding, Dressing Bathing Assistance: Limited assistance Feeding assistance: Independent Dressing Assistance: Limited assistance     Functional Limitations Info  Sight, Hearing, Speech Sight Info: Adequate Hearing Info: Adequate Speech Info: Adequate    SPECIAL CARE FACTORS FREQUENCY  PT (By licensed PT), OT (By licensed OT)     PT Frequency: HHPT OT Frequency: HHOT            Contractures Contractures Info: Not present    Additional Factors Info  Code Status, Allergies Code Status Info: DNR Allergies Info: NKA              Discharge Medications:   Medication List           STOP taking these medications          amLODipine 10 MG tablet Commonly known as:  NORVASC   predniSONE 20 MG tablet Commonly known as:  DELTASONE  TAKE these medications          acetaminophen 500 MG tablet Commonly known as:  TYLENOL Take 1,000 mg by mouth 2 (two) times daily as needed for mild pain.   albuterol (2.5 MG/3ML) 0.083% nebulizer solution Commonly known as:  PROVENTIL 1 neb every 4-6 hours as needed for wheezing and shortness of breath What changed:    how much to take  how to take this  when to take this  reasons to take this  additional instructions   ALKA-SELTZER PLUS COLD PO Take 2 tablets by mouth at bedtime as needed (COUGH).    apixaban 5 MG Tabs tablet Commonly known as:  ELIQUIS Take 2 tablets (10 mg total) by mouth 2 (two) times daily for 5 days.   apixaban 5 MG Tabs tablet Commonly known as:  ELIQUIS Take 1 tablet (5 mg total) by mouth 2 (two) times daily. Start taking on:  07/08/2017   dexamethasone 4 MG tablet Commonly known as:  DECADRON Take 4 mg by mouth 2 (two) times daily. The day before, the day of, and the day after chemotherapy every three weeks   escitalopram 5 MG tablet Commonly known as:  LEXAPRO Take 1 tablet (5 mg total) by mouth at bedtime.   feeding supplement (ENSURE ENLIVE) Liqd Take 237 mLs by mouth 3 (three) times daily between meals.   fluconazole 100 MG tablet Commonly known as:  DIFLUCAN Take 1 tablet (100 mg total) by mouth daily. What changed:  additional instructions   Fluticasone-Umeclidin-Vilant 100-62.5-25 MCG/INH Aepb Commonly known as:  TRELEGY ELLIPTA Inhale 1 puff into the lungs daily.   furosemide 20 MG tablet Commonly known as:  LASIX Take 1 tablet (20 mg total) by mouth daily.   guaiFENesin-dextromethorphan 100-10 MG/5ML syrup Commonly known as:  ROBITUSSIN DM Take 5 mLs by mouth every 4 (four) hours as needed for cough.   Ipratropium-Albuterol 20-100 MCG/ACT Aers respimat Commonly known as:  COMBIVENT Inhale 1 puff into the lungs every 6 (six) hours.   loratadine 10 MG tablet Commonly known as:  CLARITIN Take 10 mg by mouth daily.   magnesium hydroxide 400 MG/5ML suspension Commonly known as:  MILK OF MAGNESIA Take 30 mLs by mouth daily.   metoprolol tartrate 25 MG tablet Commonly known as:  LOPRESSOR Take 12.5 mg by mouth 2 (two) times daily.   MUCINEX 600 MG 12 hr tablet Generic drug:  guaiFENesin Take 1,200 mg by mouth 2 (two) times daily.   multivitamin with minerals Tabs tablet Take 1 tablet by mouth daily.   ondansetron 4 MG disintegrating tablet Commonly known as:  ZOFRAN-ODT Take 4 mg by mouth every 8 (eight)  hours as needed for nausea or vomiting.   Oxycodone HCl 10 MG Tabs Take 10 mg by mouth every 4 (four) hours as needed (pain/shortness of breath).   pantoprazole 40 MG tablet Commonly known as:  PROTONIX Take 1 tablet (40 mg total) by mouth 2 (two) times daily.   polyethylene glycol packet Commonly known as:  MIRALAX / GLYCOLAX Take 17 g by mouth 2 (two) times daily.   senna-docusate 8.6-50 MG tablet Commonly known as:  SENNA-PLUS Take 1 tablet by mouth 2 (two) times daily.                                 Durable Medical Equipment  (From admission, onward)  Start     Ordered   07/02/17 1548  For home use only DME 4 wheeled rolling walker with seat  Once    Question:  Patient needs a walker to treat with the following condition  Answer:  Unsteady gait   07/02/17 1548     No Known Allergies    Follow-up Information    [facility] Follow up.   Specialty:  [place] Why:  [facility] physical therapy Contact information: [place] [address] [phone]       Relevant Imaging Results:  Relevant Lab Results:   Additional Information SSN: [id number]   Palliative Care to follow at [place], LCSW

## 2017-07-03 NOTE — Discharge Summary (Signed)
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 Basic Metabolic Panel: Recent Labs  Lab 06/27/17 0511 06/28/17 0604 06/30/17 0329 07/01/17 0052 07/03/17 0831  NA 141 141 142 142 141  K 4.4 4.5 4.1 3.8 3.2*  CL 106 103 101 100* 98*  CO2 30 31 33* 34* 34*  GLUCOSE 103* 127* 143* 130* 90  BUN 21* 23* 29* 30* 18  CREATININE 1.26* 1.19 1.40* 1.49* 1.37*  CALCIUM 8.9 9.0 9.4 9.1 8.6*   Liver Function Tests: Recent Labs  Lab 06/27/17 0511 06/28/17 0604 07/03/17 0831  AST 33 27 25  ALT 34 31 20  ALKPHOS 85 86 87  BILITOT 0.5 0.2* 0.6  PROT 5.6* 5.7* 5.6*  ALBUMIN 2.6* 2.7* 2.8*   No results for input(s): LIPASE, AMYLASE in the last 168 hours. No results for input(s): AMMONIA in the last 168 hours. CBC: Recent Labs  Lab 06/28/17 0604 06/29/17 0644 06/30/17 0329 07/01/17 0052 07/01/17 0825 07/03/17 0831  WBC 12.5* 15.8* 18.1* 15.2*  --  12.8*  NEUTROABS  --   --  15.9*  --   --  9.6*  HGB 8.1* 8.4* 9.1* 8.3*  --  8.2*  HCT 24.2* 26.1* 28.3* 25.9*  25.8* 25.2*  MCV 101.3* 101.6* 102.2* 100.8*  --  103.7*  PLT 268 322 311 248  --  201   Cardiac Enzymes: Recent Labs  Lab 06/30/17 1258 06/30/17 1823 07/01/17 0052  TROPONINI 0.23* 0.28* 0.18*   BNP: BNP (last 3 results) Recent Labs    06/24/17 0428 06/25/17 0750 06/30/17 0812  BNP 54.5 89.9 224.5*  ProBNP (last 3 results) No results for input(s): PROBNP in the last 8760 hours.  CBG: No results for input(s): GLUCAP in the last 168 hours.     Signed:  Florencia Reasons MD, PhD  [facility] 07/03/2017, 3:19 PM

## 2017-07-03 NOTE — Progress Notes (Signed)
Palliative:  I met again today with Mr. Burby. He is very friendly. He continues to have abd pain but has also refused his medications. I asked him about not taking his medications and that many of these medications is to help him to feel better. He tells me that it is somehow because of the nurses (I did not follow his explanation). I encouraged him to take the medications because they are all to help keep him healthy and to make him feel better. I reassured him that it is safe to take so many pills at once because they are all working in different ways and different things in his body and that are pharmacists review the medications for safety as well. He said this made him feel better.   We also discussed his abd pain and the multiple reasons he could have abd pain. He is prepared for discharge today if CT abd is unrevealing. Discussed prevention of constipation especially with opioid use. I told him that he needs to continually take Miralax and senokot to hopefully help his abd pain. I also confirmed that he has an appt to follow up with [name] with goals to continue chemotherapy for lung cancer. He was appreciative of my time to answer his questions and explanations for him. All questions/concerns addressed. Emotional support provided.   5  min  Vinie Sill, NP Palliative Medicine Team Pager # [phone] (M-F 8a-5p) Team Phone # [phone] (Nights/Weekends)

## 2017-07-03 NOTE — Progress Notes (Signed)
Patient returning to [name]. CSW sent clinical documents to ALF. Facility aware of patient's discharge and confirmed patient's ability to return. PTAR contacted, patient aware. Patient's RN can call report to [phone], packet complete. CSW signing off, no other needs identified at this time.   Abundio Miu, [place] Social Worker [facility] Cell#: [phone]

## 2017-07-03 NOTE — Care Management Note (Signed)
Case Management Note  Patient Details  Name: [name] MRN: [id number] Date of Birth: 10/07/1960  Subjective/Objective: Spoke to [facility] rep Tori about palliative care services per attending/patient-they do not provide palliative care services since patient does not have the benefit-medicaid. I have also called [facility] rep Crystal-will send referral to intake, they will contact patient about the outcome-patient voiced understanding. Patient will have his rollator to be picked up my family/friend. No further CM needs.                    Action/Plan:d/c home w/HHC/dme.   Expected Discharge Date:  07/03/17               Expected Discharge Plan:  Assisted Living / Rest Home  In-House Referral:  Clinical Social Work  Discharge planning Services  CM Consult  Post Acute Care Choice:  [facility] home [phone]) Choice offered to:  Patient  DME Arranged:  Walker rolling with seat DME Agency:     HH Arranged:  PT HH Agency:  [facility]  Status of Service:  Completed, signed off  If discussed at [place] of Stay Meetings, dates discussed:    Additional Comments:  Dessa Phi, RN 07/03/2017, 4:06 PM

## 2017-07-03 NOTE — Clinical Social Work Note (Signed)
Clinical Social Work Assessment  Patient Details  Name: [name] MRN: [id number] Date of Birth: 08/06/1960  Date of referral:  07/03/17               Reason for consult:  Discharge Planning                Permission sought to share information with:  Case Manager, Facility Sport and exercise psychologist Permission granted to share information::  Yes, Verbal Permission Granted  Name::        Agency::     Relationship::     Contact Information:     Housing/Transportation Living arrangements for the past 2 months:  [place] of Information:  Patient Patient Interpreter Needed:  None Criminal Activity/Legal Involvement Pertinent to Current Situation/Hospitalization:  No - Comment as needed Significant Relationships:  Parents, Siblings Lives with:  Facility Resident Do you feel safe going back to the place where you live?  Yes Need for family participation in patient care:  No (Coment)  Care giving concerns:  Patient is a [name] term care resident at [facility]. Patient reported that he is starting need assistance with bathing and dressing. Patient uses a walker at ALF. PT recommending home health PT.   Social Worker assessment / plan:  CSW spoke with patient at bedside regarding discharge planning. Patient reported that he plans to return to [name]. Patient reported that he will need PTAR.  CSW spoke with [name] staff member [name], who confirmed patient's ability to return. Staff reported that they use [facility] for palliative services and that they don't set it up for patient.   Palliative NP recommended that patient be followed by palliative care at ALF. RNCM notified to follow up with agency ALF uses for palliative care.   CSW will complete FL2 and send clinical information to SNF.  CSW will continue to follow and assist with discharge planning.  Employment status:  Disabled (Comment on whether or not currently receiving  Disability) Insurance information:  Medicaid In [employer] PT Recommendations:  Home with [place] / Referral to community resources:  Other (Comment Required)(Patient is a [name] term care resident at [name])  Patient/Family's Response to care:  Patient appreciative of CSW assistance with discharge planning.  Patient/Family's Understanding of and Emotional Response to Diagnosis, Current Treatment, and Prognosis:  Patient presented calm and remained engaged throughout the assessment. Patient and CSW discussed patient's prognosis, patient reported that his prognosis is poor. Patient was accepting of prognosis and reported that he is trying to adapt to his condition. Patient reported that he is trying "keep hope alive" and requested prayer. CSW provided emotional support and active listening. Patient reported that he has support from his mom and sisters.   Emotional Assessment Appearance:  Appears stated age Attitude/Demeanor/Rapport:  Engaged Affect (typically observed):  Calm, Accepting, Adaptable, Tearful/Crying Orientation:  Oriented to Self, Oriented to Situation, Oriented to Place, Oriented to  Time Alcohol / Substance use:  Not Applicable Psych involvement (Current and /or in the community):  No (Comment)  Discharge Needs  Concerns to be addressed:  Care Coordination Readmission within the last 30 days:  Yes Current discharge risk:  Terminally ill Barriers to Discharge:  No Barriers Identified   [name], LCSW 07/03/2017, 3:24 PM

## 2017-07-04 ENCOUNTER — Inpatient Hospital Stay (HOSPITAL_COMMUNITY)
Admission: EM | Admit: 2017-07-04 | Discharge: 2017-07-09 | DRG: 189 | Disposition: A | Discharge status: Nursing Home (Includes ICF) | Payer: Medicaid Other | Attending: Internal Medicine | Admitting: Internal Medicine

## 2017-07-04 ENCOUNTER — Emergency Department (HOSPITAL_COMMUNITY): Payer: Medicaid Other

## 2017-07-04 ENCOUNTER — Emergency Department (HOSPITAL_COMMUNITY)
Admission: EM | Admit: 2017-07-04 | Discharge: 2017-07-04 | Disposition: A | Discharge status: Home / Self Care | Payer: Medicaid Other | Source: Home / Self Care | Attending: Emergency Medicine | Admitting: Emergency Medicine

## 2017-07-04 ENCOUNTER — Encounter (HOSPITAL_COMMUNITY): Payer: Self-pay | Admitting: Emergency Medicine

## 2017-07-04 ENCOUNTER — Encounter (HOSPITAL_COMMUNITY): Payer: Self-pay | Admitting: Nurse Practitioner

## 2017-07-04 DIAGNOSIS — E876 Hypokalemia: Secondary | ICD-10-CM | POA: Diagnosis not present

## 2017-07-04 DIAGNOSIS — F17210 Nicotine dependence, cigarettes, uncomplicated: Secondary | ICD-10-CM

## 2017-07-04 DIAGNOSIS — Z85118 Personal history of other malignant neoplasm of bronchus and lung: Secondary | ICD-10-CM | POA: Diagnosis not present

## 2017-07-04 DIAGNOSIS — J90 Pleural effusion, not elsewhere classified: Secondary | ICD-10-CM | POA: Diagnosis present

## 2017-07-04 DIAGNOSIS — Z79899 Other long term (current) drug therapy: Secondary | ICD-10-CM | POA: Insufficient documentation

## 2017-07-04 DIAGNOSIS — Z66 Do not resuscitate: Secondary | ICD-10-CM | POA: Diagnosis present

## 2017-07-04 DIAGNOSIS — I5042 Chronic combined systolic (congestive) and diastolic (congestive) heart failure: Secondary | ICD-10-CM | POA: Diagnosis present

## 2017-07-04 DIAGNOSIS — N183 Chronic kidney disease, stage 3 unspecified: Secondary | ICD-10-CM | POA: Diagnosis present

## 2017-07-04 DIAGNOSIS — R0902 Hypoxemia: Secondary | ICD-10-CM | POA: Diagnosis present

## 2017-07-04 DIAGNOSIS — C7951 Secondary malignant neoplasm of bone: Secondary | ICD-10-CM | POA: Diagnosis present

## 2017-07-04 DIAGNOSIS — I130 Hypertensive heart and chronic kidney disease with heart failure and stage 1 through stage 4 chronic kidney disease, or unspecified chronic kidney disease: Secondary | ICD-10-CM

## 2017-07-04 DIAGNOSIS — C3492 Malignant neoplasm of unspecified part of left bronchus or lung: Secondary | ICD-10-CM | POA: Diagnosis not present

## 2017-07-04 DIAGNOSIS — I509 Heart failure, unspecified: Secondary | ICD-10-CM

## 2017-07-04 DIAGNOSIS — C3412 Malignant neoplasm of upper lobe, left bronchus or lung: Secondary | ICD-10-CM | POA: Diagnosis present

## 2017-07-04 DIAGNOSIS — Z7189 Other specified counseling: Secondary | ICD-10-CM | POA: Diagnosis not present

## 2017-07-04 DIAGNOSIS — N179 Acute kidney failure, unspecified: Secondary | ICD-10-CM | POA: Diagnosis present

## 2017-07-04 DIAGNOSIS — C3490 Malignant neoplasm of unspecified part of unspecified bronchus or lung: Secondary | ICD-10-CM | POA: Insufficient documentation

## 2017-07-04 DIAGNOSIS — R627 Adult failure to thrive: Secondary | ICD-10-CM | POA: Diagnosis present

## 2017-07-04 DIAGNOSIS — Z7951 Long term (current) use of inhaled steroids: Secondary | ICD-10-CM

## 2017-07-04 DIAGNOSIS — Z7901 Long term (current) use of anticoagulants: Secondary | ICD-10-CM

## 2017-07-04 DIAGNOSIS — I2699 Other pulmonary embolism without acute cor pulmonale: Secondary | ICD-10-CM | POA: Diagnosis present

## 2017-07-04 DIAGNOSIS — J441 Chronic obstructive pulmonary disease with (acute) exacerbation: Secondary | ICD-10-CM | POA: Diagnosis present

## 2017-07-04 DIAGNOSIS — D631 Anemia in chronic kidney disease: Secondary | ICD-10-CM | POA: Diagnosis present

## 2017-07-04 DIAGNOSIS — I10 Essential (primary) hypertension: Secondary | ICD-10-CM | POA: Diagnosis present

## 2017-07-04 DIAGNOSIS — K5900 Constipation, unspecified: Secondary | ICD-10-CM | POA: Diagnosis not present

## 2017-07-04 DIAGNOSIS — R0602 Shortness of breath: Secondary | ICD-10-CM

## 2017-07-04 DIAGNOSIS — Z9981 Dependence on supplemental oxygen: Secondary | ICD-10-CM | POA: Diagnosis not present

## 2017-07-04 DIAGNOSIS — J9621 Acute and chronic respiratory failure with hypoxia: Principal | ICD-10-CM | POA: Diagnosis present

## 2017-07-04 DIAGNOSIS — Z515 Encounter for palliative care: Secondary | ICD-10-CM | POA: Diagnosis not present

## 2017-07-04 DIAGNOSIS — J449 Chronic obstructive pulmonary disease, unspecified: Secondary | ICD-10-CM | POA: Insufficient documentation

## 2017-07-04 DIAGNOSIS — Z9889 Other specified postprocedural states: Secondary | ICD-10-CM

## 2017-07-04 LAB — CBC WITH DIFFERENTIAL/PLATELET
BASOS ABS: 0 10*3/uL (ref 0.0–0.1)
Basophils Relative: 0 %
EOS PCT: 0 %
Eosinophils Absolute: 0 10*3/uL (ref 0.0–0.7)
HCT: 24.7 % — ABNORMAL LOW (ref 39.0–52.0)
HEMOGLOBIN: 7.9 g/dL — AB (ref 13.0–17.0)
Lymphocytes Relative: 12 %
Lymphs Abs: 1.7 10*3/uL (ref 0.7–4.0)
MCH: 33.3 pg (ref 26.0–34.0)
MCHC: 32 g/dL (ref 30.0–36.0)
MCV: 104.2 fL — ABNORMAL HIGH (ref 78.0–100.0)
MONO ABS: 1.6 10*3/uL (ref 0.1–1.0)
Monocytes Relative: 11 %
NEUTROS ABS: 11.3 10*3/uL (ref 1.7–7.7)
Neutrophils Relative %: 77 %
PLATELETS: 194 10*3/uL (ref 150–400)
RBC: 2.37 MIL/uL — ABNORMAL LOW (ref 4.22–5.81)
RDW: 16.9 % — AB (ref 11.5–15.5)
WBC: 14.7 10*3/uL — ABNORMAL HIGH (ref 4.0–10.5)

## 2017-07-04 LAB — BRAIN NATRIURETIC PEPTIDE: B Natriuretic Peptide: 168.6 pg/mL — ABNORMAL HIGH (ref 0.0–100.0)

## 2017-07-04 LAB — I-STAT TROPONIN, ED: Troponin i, poc: 0.04 ng/mL (ref 0.00–0.08)

## 2017-07-04 LAB — BASIC METABOLIC PANEL
Anion gap: 9 (ref 5–15)
BUN: 23 mg/dL — AB (ref 6–20)
CALCIUM: 9 mg/dL (ref 8.9–10.3)
CO2: 33 mmol/L — ABNORMAL HIGH (ref 22–32)
CREATININE: 1.56 mg/dL — AB (ref 0.61–1.24)
Chloride: 98 mmol/L — ABNORMAL LOW (ref 101–111)
GFR calc non Af Amer: 48 mL/min — ABNORMAL LOW (ref >60)
GFR, EST AFRICAN AMERICAN: 55 mL/min — AB (ref >60)
Glucose, Bld: 87 mg/dL (ref 65–99)
Potassium: 3.5 mmol/L (ref 3.5–5.1)
SODIUM: 140 mmol/L (ref 135–145)

## 2017-07-04 LAB — TROPONIN I: TROPONIN I: 0.08 ng/mL — AB (ref <0.03)

## 2017-07-04 MED ORDER — APIXABAN 5 MG PO TABS
10.0000 mg | ORAL_TABLET | Freq: Two times a day (BID) | ORAL | Status: AC
  Administered 2017-07-05 – 2017-07-08 (×8): 10 mg via ORAL
  Filled 2017-07-04 (×9): qty 2

## 2017-07-04 MED ORDER — IPRATROPIUM-ALBUTEROL 20-100 MCG/ACT IN AERS: 1.0000 | INHALATION_SPRAY | Freq: Four times a day (QID) | RESPIRATORY_TRACT | Status: DC

## 2017-07-04 MED ORDER — MAGNESIUM HYDROXIDE 400 MG/5ML PO SUSP
30.0000 mL | Freq: Every day | ORAL | Status: DC
  Filled 2017-07-04: qty 30

## 2017-07-04 MED ORDER — OXYCODONE HCL 5 MG PO TABS
10.0000 mg | ORAL_TABLET | ORAL | Status: DC | PRN
  Administered 2017-07-05 – 2017-07-09 (×15): 10 mg via ORAL
  Filled 2017-07-04 (×16): qty 2

## 2017-07-04 MED ORDER — ACETAMINOPHEN 650 MG RE SUPP: 650.0000 mg | Freq: Four times a day (QID) | RECTAL | Status: DC | PRN

## 2017-07-04 MED ORDER — GUAIFENESIN ER 600 MG PO TB12
1200.0000 mg | ORAL_TABLET | Freq: Two times a day (BID) | ORAL | Status: DC
  Administered 2017-07-05 – 2017-07-09 (×10): 1200 mg via ORAL
  Filled 2017-07-04 (×10): qty 2

## 2017-07-04 MED ORDER — IPRATROPIUM-ALBUTEROL 0.5-2.5 (3) MG/3ML IN SOLN
3.0000 mL | Freq: Once | RESPIRATORY_TRACT | Status: AC
  Administered 2017-07-04: 3 mL via RESPIRATORY_TRACT
  Filled 2017-07-04: qty 3

## 2017-07-04 MED ORDER — FUROSEMIDE 40 MG PO TABS
20.0000 mg | ORAL_TABLET | Freq: Every day | ORAL | Status: DC
  Administered 2017-07-05: 20 mg via ORAL
  Filled 2017-07-04: qty 1

## 2017-07-04 MED ORDER — ALBUTEROL SULFATE (2.5 MG/3ML) 0.083% IN NEBU
2.5000 mg | INHALATION_SOLUTION | RESPIRATORY_TRACT | Status: DC | PRN
  Administered 2017-07-05: 2.5 mg via RESPIRATORY_TRACT
  Filled 2017-07-04: qty 3

## 2017-07-04 MED ORDER — ESCITALOPRAM OXALATE 10 MG PO TABS
5.0000 mg | ORAL_TABLET | Freq: Every day | ORAL | Status: DC
  Administered 2017-07-05 – 2017-07-08 (×5): 5 mg via ORAL
  Filled 2017-07-04 (×5): qty 1

## 2017-07-04 MED ORDER — PANTOPRAZOLE SODIUM 40 MG PO TBEC
40.0000 mg | DELAYED_RELEASE_TABLET | Freq: Two times a day (BID) | ORAL | Status: DC
  Administered 2017-07-05 – 2017-07-09 (×10): 40 mg via ORAL
  Filled 2017-07-04 (×10): qty 1

## 2017-07-04 MED ORDER — METOPROLOL TARTRATE 25 MG PO TABS
12.5000 mg | ORAL_TABLET | Freq: Two times a day (BID) | ORAL | Status: DC
  Administered 2017-07-05 – 2017-07-09 (×10): 12.5 mg via ORAL
  Filled 2017-07-04 (×10): qty 1

## 2017-07-04 MED ORDER — ENSURE ENLIVE PO LIQD
237.0000 mL | Freq: Three times a day (TID) | ORAL | Status: DC
Start: 2017-07-05 — End: 2017-07-09
  Administered 2017-07-06 – 2017-07-08 (×3): 237 mL via ORAL

## 2017-07-04 MED ORDER — FLUTICASONE-UMECLIDIN-VILANT 100-62.5-25 MCG/INH IN AEPB: 1.0000 | INHALATION_SPRAY | Freq: Every day | RESPIRATORY_TRACT | Status: DC

## 2017-07-04 MED ORDER — ONDANSETRON HCL 4 MG PO TABS
4.0000 mg | ORAL_TABLET | Freq: Four times a day (QID) | ORAL | Status: DC | PRN
  Administered 2017-07-08 – 2017-07-09 (×2): 4 mg via ORAL
  Filled 2017-07-04 (×2): qty 1

## 2017-07-04 MED ORDER — HEPARIN SOD (PORK) LOCK FLUSH 100 UNIT/ML IV SOLN
500.0000 [IU] | Freq: Once | INTRAVENOUS | Status: AC
  Administered 2017-07-04: 500 [IU]
  Filled 2017-07-04: qty 5

## 2017-07-04 MED ORDER — ACETAMINOPHEN 500 MG PO TABS: 1000.0000 mg | ORAL_TABLET | Freq: Two times a day (BID) | ORAL | Status: DC | PRN

## 2017-07-04 MED ORDER — METHYLPREDNISOLONE SODIUM SUCC 125 MG IJ SOLR
80.0000 mg | Freq: Once | INTRAMUSCULAR | Status: AC
  Administered 2017-07-04: 80 mg via INTRAVENOUS
  Filled 2017-07-04: qty 2

## 2017-07-04 MED ORDER — LORATADINE 10 MG PO TABS
10.0000 mg | ORAL_TABLET | Freq: Every day | ORAL | Status: DC
  Administered 2017-07-05 – 2017-07-09 (×5): 10 mg via ORAL
  Filled 2017-07-04 (×5): qty 1

## 2017-07-04 MED ORDER — ONDANSETRON HCL 4 MG/2ML IJ SOLN
4.0000 mg | Freq: Four times a day (QID) | INTRAMUSCULAR | Status: DC | PRN
  Administered 2017-07-05: 4 mg via INTRAVENOUS
  Filled 2017-07-04: qty 2

## 2017-07-04 MED ORDER — APIXABAN 5 MG PO TABS
5.0000 mg | ORAL_TABLET | Freq: Two times a day (BID) | ORAL | Status: DC
Start: 2017-07-08 — End: 2017-07-09
  Administered 2017-07-08 – 2017-07-09 (×2): 5 mg via ORAL
  Filled 2017-07-04 (×2): qty 1

## 2017-07-04 MED ORDER — ACETAMINOPHEN 325 MG PO TABS
650.0000 mg | ORAL_TABLET | Freq: Four times a day (QID) | ORAL | Status: DC | PRN
  Administered 2017-07-05: 650 mg via ORAL
  Filled 2017-07-04: qty 2

## 2017-07-04 MED ORDER — ALBUTEROL (5 MG/ML) CONTINUOUS INHALATION SOLN
5.0000 mg/h | INHALATION_SOLUTION | Freq: Once | RESPIRATORY_TRACT | Status: AC
  Administered 2017-07-04: 5 mg/h via RESPIRATORY_TRACT
  Filled 2017-07-04: qty 20

## 2017-07-04 NOTE — ED Notes (Signed)
Bed: RESA Expected date:  Expected time:  Means of arrival:  Comments: EMS-SOB-cancer patient

## 2017-07-04 NOTE — ED Notes (Signed)
Pt refused to remove t-shirt prior to port access.  This Probation officer kept sterile technique as much as possible.

## 2017-07-04 NOTE — ED Provider Notes (Addendum)
[facility] DEPT Provider Note   CSN: [id number] Arrival date & time: 07/04/17  2778     History   Chief Complaint Chief Complaint  Patient presents with  . Shortness of Breath    HPI [name] is a 57 y.o. 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  He was seen and evaluated for the same earlier today and was found to be stable for discharge back to his nursing home at that time.  He states that he went home and was outside.  He was given his scheduled pain medicine and states he thinks he may have fallen asleep.  When he awoke at around 3:30 PM he felt extremely short of breath with substernal chest pressure which did not radiate.  Pain lasted approximately 30 minutes.  He is unable to describe any aggravating or alleviating factors.  He is a poor historian.  He did not try anything for his symptoms at the time.  He was given multiple breathing treatments while in the ED which he states were minimally helpful.  He also thinks he had a fever earlier today when EMS was examining him.  He notes chronic cough but states it is worsening, productive of clear sputum.  Denies nausea, vomiting, abdominal pain.  He was discharged from the hospital yesterday secondary to worsening dyspnea and PE, he was started on Eliquis.  He is on home O2 2 to 3 L/min continuously.  He continues to smoke 2 cigarettes daily.  The history is provided by the patient.    Past Medical History:  Diagnosis Date  . Abdominal pain 06/04/2016  . Adenocarcinoma of left lung, stage 4 ([place]) 05/02/2016  . Alcohol abuse   . Bronchitis due to tobacco use ([place])   . Cancer ([place])    Lung  . COPD (chronic obstructive pulmonary disease) ([place])   . Dehydration 06/04/2016  . Encounter for antineoplastic chemotherapy 05/02/2016  . Gastritis   .  Goals of care, counseling/discussion 05/02/2016  . Hematemesis   . HTN (hypertension) 10/30/2016  . Recurrent upper respiratory infection (URI)   . Seizures ([place]) 05/2011   new onset  . Shortness of breath     Patient Active Problem List   Diagnosis Date Noted  . PE (pulmonary thromboembolism) ([place]) 06/30/2017  . Pulmonary embolism ([place]) 06/30/2017  . AKI (acute kidney injury) ([place]) 06/25/2017  . Dyspnea 06/21/2017  . Chronic combined systolic (congestive) and diastolic (congestive) heart failure ([place])   . Constipation 06/07/2017  . Pain   . Chronic respiratory failure with hypoxia ([place]) 04/01/2017  . Leucocytosis 03/16/2017  . CKD (chronic kidney disease), stage III ([place]) 03/12/2017  . Malnutrition of moderate degree 03/10/2017  . COPD exacerbation ([place]) 03/09/2017  . Anemia 02/07/2017  . Severe malnutrition ([place]) 01/03/2017  . DOE (dyspnea on exertion) 12/30/2016  . Nausea   . Hypervolemia   . HCAP (healthcare-associated pneumonia) 12/19/2016  . SOB (shortness of breath) 12/19/2016  . Erosive gastropathy 12/18/2016  . Gastritis 12/18/2016  . Hematemesis 12/16/2016  . Intractable nausea and vomiting 12/16/2016  . HTN (hypertension) 10/30/2016  . Chronic obstructive pulmonary disease ([place]) 09/02/2016  . COPD (chronic obstructive pulmonary disease) ([place]) 08/19/2016  . Dehydration 06/04/2016  . Abdominal pain 06/04/2016  . 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 Adenocarcinoma of left lung, stage 4 ([place]) 05/02/2016  .  Goals of care, counseling/discussion 05/02/2016  . Encounter for antineoplastic chemotherapy 05/02/2016  . Tobacco abuse 05/30/2011  . Alcohol abuse 05/30/2011  . Seizure ([place]) 05/28/2011    Past Surgical History:  Procedure Laterality Date  . ESOPHAGOGASTRODUODENOSCOPY (EGD) WITH PROPOFOL N/A 12/17/2016   Procedure: ESOPHAGOGASTRODUODENOSCOPY (EGD) WITH PROPOFOL;  Surgeon: Ladene Artist, MD;  Location: [facility] ENDOSCOPY;  Service: Endoscopy;  Laterality: N/A;   . IR FLUORO GUIDE PORT INSERTION RIGHT  05/13/2016  . IR US GUIDE VASC ACCESS RIGHT  05/13/2016  . NO PAST SURGERIES          Home Medications    Prior to Admission medications   Medication Sig Start Date End Date Taking? Authorizing Provider  acetaminophen (TYLENOL) 500 MG tablet Take 1,000 mg by mouth 2 (two) times daily as needed for mild pain.    [provider]  albuterol (PROVENTIL) (2.5 MG/3ML) 0.083% nebulizer solution 1 neb every 4-6 hours as needed for wheezing and shortness of breath Patient taking differently: Take 2.5 mg by nebulization every 4 (four) hours as needed for wheezing or shortness of breath.  04/01/17   Parrett, Fonnie Mu, NP  apixaban (ELIQUIS) 5 MG TABS tablet Take 2 tablets (10 mg total) by mouth 2 (two) times daily for 5 days. 07/03/17 07/08/17  Florencia Reasons, MD  apixaban (ELIQUIS) 5 MG TABS tablet Take 1 tablet (5 mg total) by mouth 2 (two) times daily. 07/08/17   Florencia Reasons, MD  Chlorphen-Phenyleph-ASA (ALKA-SELTZER PLUS COLD PO) Take 2 tablets by mouth at bedtime as needed (COUGH).    [provider]  dexamethasone (DECADRON) 4 MG tablet Take 4 mg by mouth 2 (two) times daily. The day before, the day of, and the day after chemotherapy every three weeks    [provider]  escitalopram (LEXAPRO) 5 MG tablet Take 1 tablet (5 mg total) by mouth at bedtime. 04/17/17   Rosita Fire, MD  feeding supplement, ENSURE ENLIVE, (ENSURE ENLIVE) LIQD Take 237 mLs by mouth 3 (three) times daily between meals. 03/19/17   Shelly Coss, MD  fluconazole (DIFLUCAN) 100 MG tablet Take 1 tablet (100 mg total) by mouth daily. Patient not taking: Reported on 07/04/2017 03/20/17   Shelly Coss, MD  Fluticasone-Umeclidin-Vilant (TRELEGY ELLIPTA) 100-62.5-25 MCG/INH AEPB Inhale 1 puff into the lungs daily. 05/31/17   Aline August, MD  furosemide (LASIX) 20 MG tablet Take 1 tablet (20 mg total) by mouth daily. 06/10/17   Eugenie Filler, MD  guaiFENesin  (MUCINEX) 600 MG 12 hr tablet Take 1,200 mg by mouth 2 (two) times daily.     [provider]  guaiFENesin-dextromethorphan (ROBITUSSIN DM) 100-10 MG/5ML syrup Take 5 mLs by mouth every 4 (four) hours as needed for cough. Patient not taking: Reported on 07/04/2017 05/31/17   Aline August, MD  Ipratropium-Albuterol (COMBIVENT) 20-100 MCG/ACT AERS respimat Inhale 1 puff into the lungs every 6 (six) hours. Patient taking differently: Inhale 1 puff into the lungs every 6 (six) hours as needed for wheezing or shortness of breath.  05/31/17   Aline August, MD  loratadine (CLARITIN) 10 MG tablet Take 10 mg by mouth daily.    [provider]  magnesium hydroxide (MILK OF MAGNESIA) 400 MG/5ML suspension Take 30 mLs by mouth daily. Patient not taking: Reported on 07/04/2017 06/11/17   Eugenie Filler, MD  metoprolol tartrate (LOPRESSOR) 25 MG tablet Take 12.5 mg by mouth 2 (two) times daily.     [provider]  Multiple Vitamin (MULTIVITAMIN  WITH MINERALS) TABS tablet Take 1 tablet by mouth daily. Patient not taking: Reported on 07/04/2017 06/23/17   Cristal Ford, DO  ondansetron (ZOFRAN-ODT) 4 MG disintegrating tablet Take 4 mg by mouth every 8 (eight) hours as needed for nausea or vomiting.    [provider]  Oxycodone HCl 10 MG TABS Take 10 mg by mouth every 4 (four) hours as needed (pain/shortness of breath).     [provider]  pantoprazole (PROTONIX) 40 MG tablet Take 1 tablet (40 mg total) by mouth 2 (two) times daily. 12/18/16   Eugenie Filler, MD  polyethylene glycol [facility] / Floria Raveling) packet Take 17 g by mouth 2 (two) times daily. Patient not taking: Reported on 07/04/2017 06/10/17   Eugenie Filler, MD  senna-docusate (SENNA-PLUS) 8.6-50 MG tablet Take 1 tablet by mouth 2 (two) times daily. Patient not taking: Reported on 07/04/2017 06/10/17   Eugenie Filler, MD    Family History Family History  Problem Relation Age of Onset  .  Cancer Father   . Diabetes Mellitus II Mother     Social History Social History   Tobacco Use  . Smoking status: Current Some Day Smoker    Packs/day: 0.10    Years: 30.00    Pack years: 3.00    Types: Cigarettes  . Smokeless tobacco: Never Used  . Tobacco comment: 1-2 cig a day   Substance Use Topics  . Alcohol use: No    Frequency: Never  . Drug use: No     Allergies   Patient has no known allergies.   Review of Systems Review of Systems  Constitutional: Positive for fever.  Respiratory: Positive for cough and shortness of breath.   Cardiovascular: Positive for chest pain.  Gastrointestinal: Negative for abdominal pain, nausea and vomiting.  All other systems reviewed and are negative.    Physical Exam Updated Vital Signs BP (!) 150/86 (BP Location: Right Arm)   Pulse 80   Temp (!) 97.3 F (36.3 C) (Oral)   Resp 20   SpO2 100%   Physical Exam  Constitutional: He appears well-developed and well-nourished. No distress.  HENT:  Head: Normocephalic and atraumatic.  Eyes: Conjunctivae are normal. Right eye exhibits no discharge. Left eye exhibits no discharge.  Neck: Normal range of motion. Neck supple. No JVD present. No tracheal deviation present.  Cardiovascular: Normal rate and regular rhythm.  2+ radial and DP/PT pulses bilaterally, 1+ pitting edema of the bilateral lower extremity  Pulmonary/Chest: Tachypnea noted.  Port-A-Cath to the left side of the chest with no tenderness, erythema, or induration.  Mildly tachypnea, speaking in full sentences without difficulty on 3 L via nasal cannula.  Right-sided crackles, globally diminished breath sounds.  Anterior chest wall is tender to palpation with no focal tenderness, deformity, crepitus, ecchymosis, or flail segment.  Abdominal: Soft. Bowel sounds are normal. He exhibits no distension. There is no tenderness.  Musculoskeletal:       Right lower leg: He exhibits edema. He exhibits no tenderness.       Left  lower leg: He exhibits edema. He exhibits no tenderness.  Neurological: He is alert.  Skin: Skin is warm and dry. No erythema.  Psychiatric: He has a normal mood and affect. His behavior is normal.  Nursing note and vitals reviewed.    ED Treatments / Results  Labs (all labs ordered are listed, but only abnormal results are displayed) Labs Reviewed  BRAIN NATRIURETIC PEPTIDE - Abnormal; Notable for the following components:  Result Value   B Natriuretic Peptide 168.6 (*)    All other components within normal limits  I-STAT TROPONIN, ED    EKG EKG Interpretation  Date/Time:  Friday July 04 2017 21:18:08 EDT Ventricular Rate:  69 PR Interval:    QRS Duration: 88 QT Interval:  462 QTC Calculation: 495 R Axis:   99 Text Interpretation:  Sinus rhythm Borderline right axis deviation Abnormal T, probable ischemia, anterior leads Baseline wander When compared with ECG of 02/03/2017 No significant change was found Confirmed by Francine Graven [phone]) on 07/04/2017 9:23:31 PM   Radiology Ct Abdomen Pelvis Wo Contrast  Result Date: 07/03/2017 CLINICAL DATA:  Abdominal distension, worsening shortness of breath, lung cancer, pulmonary embolism, COPD, CHF EXAM: CT ABDOMEN AND PELVIS WITHOUT CONTRAST TECHNIQUE: Multidetector CT imaging of the abdomen and pelvis was performed following the standard protocol without IV contrast. Sagittal and coronal MPR images reconstructed from axial data set. Patient drank dilute oral contrast for exam COMPARISON:  12/16/2016 FINDINGS: Lower chest: Moderate RIGHT pleural effusion. Peribronchial thickening, more pronounced in LEFT lower lobe with question mucous plugging emphysematous changes at LEFT base. Compressive atelectasis RIGHT lower lobe. Minimal LEFT pleural fluid. Hepatobiliary: Gallbladder and liver unremarkable Pancreas: Normal appearance Spleen: Normal appearance Adrenals/Urinary Tract: Adrenal glands, kidneys, ureters, and bladder normal  appearance Stomach/Bowel: Visualization of proximal appendix in RIGHT pelvis, grossly unremarkable. Stool throughout colon. Large and small bowel loops normal appearance. Distended stomach with wall thickening of antrum and pylorus extending in the duodenal bulb could reflect gastritis. Vascular/Lymphatic: Atherosclerotic calcifications aorta and iliac arteries. Tip of central venous catheter at cavoatrial junction. No adenopathy. Reproductive: Minimal prostatic enlargement. Other: Low-attenuation free fluid in pelvis and minimally at scattered in upper abdomen. No free air. No hernia. Musculoskeletal: Sclerotic T12 vertebral body again identified, question due to osseous metastatic disease. Additional sclerotic focus at the inferior L2 vertebral body. Lytic lesion with sclerotic rim at inferior L3 unchanged. Metallic foreign body at inferior medial LEFT buttock. IMPRESSION: RIGHT pleural effusion and basilar atelectasis. Minimal LEFT pleural effusion and scattered ascites greatest in pelvis. Wall thickening of distal gastric antrum pylorus, could reflect gastritis. Suspected sclerotic metastatic lesions of T12 and L2 with an additional chronic well-circumscribed lytic focus at inferior L3. Electronically Signed   By: Lavonia Dana M.D.   On: 07/03/2017 13:02   Dg Chest 2 View  Result Date: 07/04/2017 CLINICAL DATA:  Shortness of breath EXAM: CHEST - 2 VIEW COMPARISON:  July 04, 2017 FINDINGS: A right Port-A-Cath is in good position. Bilateral pleural effusions are stable. Stable left upper lobe mass, unchanged. Increased lucency in the left lung compared to the right is stable. Increased haziness on the right is stable compared to the left. No change in the cardiomediastinal silhouette. IMPRESSION: No interval change in bilateral pleural effusions, a left upper lobe mass, the right Port-A-Cath, or haziness in the right chest. Electronically Signed   By: Dorise Bullion III M.D   On: 07/04/2017 22:33   Dg Chest  2 View  Result Date: 07/04/2017 CLINICAL DATA:  Worsening shortness of Breath EXAM: CHEST - 2 VIEW COMPARISON:  06/30/2017 FINDINGS: Cardiac shadow is stable. Right chest wall port is again seen. Right pleural effusion is seen and stable from the previous CT examination. Stable left upper lobe mass lesion is noted. No acute bony abnormality is noted. IMPRESSION: Stable left upper lobe mass. Right-sided pleural effusion similar to that seen on recent CT examination. Electronically Signed   By: Linus Mako.D.  On: 07/04/2017 08:38    Procedures Procedures (including critical care time)  Medications Ordered in ED Medications  ipratropium-albuterol (DUONEB) 0.5-2.5 (3) MG/3ML nebulizer solution 3 mL (3 mLs Nebulization Given 07/04/17 2117)  albuterol (PROVENTIL,VENTOLIN) solution continuous neb (5 mg/hr Nebulization Given 07/04/17 2325)     Initial Impression / Assessment and Plan / ED Course  I have reviewed the triage vital signs and the nursing notes.  Pertinent labs & imaging results that were available during my care of the patient were reviewed by me and considered in my medical decision making (see chart for details).     Patient with history of COPD, stage IV lung cancer, and newly diagnosed acute PE with elevated troponin presents for worsening shortness of breath.  He is afebrile, tachypneic.  Lab work appears to be at the patient's baseline, troponin is improving.  EKG shows no acute changes or ischemic changes.  Chest x-ray shows stable left upper lobe mass and right-sided pleural effusion.  Patient received multiple breathing treatments and steroids when he was in the ED earlier today without significant relief of his symptoms.  He received a DuoNeb when I evaluated him with no relief in his shortness of breath.  When ambulated, the patient's SPO2 saturations dropped to 83% on 2 L via nasal cannula which is his usual home oxygen.  He also feels lightheaded/dizzy, symptoms improved  with rest.  Spoke with Dr. Alcario Drought with [facility] who agrees to evaluate patient and assume care.  Final Clinical Impressions(s) / ED Diagnoses   Final diagnoses:  Hypoxia    ED Discharge Orders    None       Renita Papa, PA-C 07/04/17 [place], [place], DO 07/06/17 1542

## 2017-07-04 NOTE — ED Provider Notes (Signed)
[name] DEPT Provider Note   CSN: [id number] Arrival date & time: 07/04/17  0701     History   Chief Complaint Chief Complaint  Patient presents with  . Shortness of Breath    HPI [name] is a 57 y.o. male.  Patient with known stage IV adenocarcinoma of the lung currently on chemotherapy at [facility] and status post PE 1 week ago presents with persistent dyspnea.  He lives in assisted living facility.  He uses oxygen and nebulizer treatments as needed.  No new fever, sweats, chills, productive sputum, bloody sputum, chest pain.  He was recently put on Eliquis for the PE.  He is known to be anemic.  He walks but minimally.  Severity of symptoms is mild to moderate.  Exertion makes symptoms worse.       Past Medical History:  Diagnosis Date  . Abdominal pain 06/04/2016  . Adenocarcinoma of left lung, stage 4 ([place]) 05/02/2016  . Alcohol abuse   . Bronchitis due to tobacco use ([place])   . Cancer ([place])    Lung  . COPD (chronic obstructive pulmonary disease) ([place])   . Dehydration 06/04/2016  . Encounter for antineoplastic chemotherapy 05/02/2016  . Gastritis   . Goals of care, counseling/discussion 05/02/2016  . Hematemesis   . HTN (hypertension) 10/30/2016  . Recurrent upper respiratory infection (URI)   . Seizures ([place]) 05/2011   new onset  . Shortness of breath     Patient Active Problem List   Diagnosis Date Noted  . PE (pulmonary thromboembolism) ([place]) 06/30/2017  . Pulmonary embolism ([place]) 06/30/2017  . AKI (acute kidney injury) ([place]) 06/25/2017  . Dyspnea 06/21/2017  . Chronic combined systolic (congestive) and diastolic (congestive) heart failure ([place])   . Constipation 06/07/2017  . Pain   . Acute on chronic respiratory failure with hypoxia ([place]) 05/28/2017  . Chronic respiratory failure with hypoxia ([place]) 04/01/2017  . Leucocytosis 03/16/2017  . CKD (chronic kidney disease), stage III ([place]) 03/12/2017  . Malnutrition  of moderate degree 03/10/2017  . COPD exacerbation ([place]) 03/09/2017  . Anemia 02/07/2017  . Severe malnutrition ([place]) 01/03/2017  . DOE (dyspnea on exertion) 12/30/2016  . Nausea   . Hypervolemia   . HCAP (healthcare-associated pneumonia) 12/19/2016  . SOB (shortness of breath) 12/19/2016  . Erosive gastropathy 12/18/2016  . Gastritis 12/18/2016  . Hematemesis 12/16/2016  . Intractable nausea and vomiting 12/16/2016  . HTN (hypertension) 10/30/2016  . Chronic obstructive pulmonary disease ([place]) 09/02/2016  . COPD (chronic obstructive pulmonary disease) ([place]) 08/19/2016  . Dehydration 06/04/2016  . Abdominal pain 06/04/2016  . 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 Adenocarcinoma of left lung, stage 4 ([place]) 05/02/2016  . Goals of care, counseling/discussion 05/02/2016  . Encounter for antineoplastic chemotherapy 05/02/2016  . Tobacco abuse 05/30/2011  . Alcohol abuse 05/30/2011  . Seizure ([place]) 05/28/2011    Past Surgical History:  Procedure Laterality Date  . ESOPHAGOGASTRODUODENOSCOPY (EGD) WITH PROPOFOL N/A 12/17/2016   Procedure: ESOPHAGOGASTRODUODENOSCOPY (EGD) WITH PROPOFOL;  Surgeon: Ladene Artist, MD;  Location: [facility] ENDOSCOPY;  Service: Endoscopy;  Laterality: N/A;  . IR FLUORO GUIDE PORT INSERTION RIGHT  05/13/2016  . IR US GUIDE VASC ACCESS RIGHT  05/13/2016  . NO PAST SURGERIES          Home Medications    Prior to Admission medications   Medication Sig Start Date End Date Taking? Authorizing Provider  acetaminophen (TYLENOL) 500 MG tablet Take 1,000 mg by mouth 2 (two)  times daily as needed for mild pain.   Yes [provider]  albuterol (PROVENTIL) (2.5 MG/3ML) 0.083% nebulizer solution 1 neb every 4-6 hours as needed for wheezing and shortness of breath Patient taking differently: Take 2.5 mg by nebulization every 4 (four) hours as needed for wheezing or shortness of breath.  04/01/17  Yes [place], Fonnie Mu, NP  apixaban (ELIQUIS) 5 MG TABS tablet Take  2 tablets (10 mg total) by mouth 2 (two) times daily for 5 days. 07/03/17 07/08/17 Yes Florencia Reasons, MD  apixaban (ELIQUIS) 5 MG TABS tablet Take 1 tablet (5 mg total) by mouth 2 (two) times daily. 07/08/17  Yes Florencia Reasons, MD  Chlorphen-Phenyleph-ASA (ALKA-SELTZER PLUS COLD PO) Take 2 tablets by mouth at bedtime as needed (COUGH).   Yes [provider]  dexamethasone (DECADRON) 4 MG tablet Take 4 mg by mouth 2 (two) times daily. The day before, the day of, and the day after chemotherapy every three weeks   Yes [provider]  escitalopram (LEXAPRO) 5 MG tablet Take 1 tablet (5 mg total) by mouth at bedtime. 04/17/17  Yes Rosita Fire, MD  feeding supplement, ENSURE ENLIVE, (ENSURE ENLIVE) LIQD Take 237 mLs by mouth 3 (three) times daily between meals. 03/19/17  Yes Shelly Coss, MD  Fluticasone-Umeclidin-Vilant (TRELEGY ELLIPTA) 100-62.5-25 MCG/INH AEPB Inhale 1 puff into the lungs daily. 05/31/17  Yes Aline August, MD  furosemide (LASIX) 20 MG tablet Take 1 tablet (20 mg total) by mouth daily. 06/10/17  Yes Eugenie Filler, MD  guaiFENesin (MUCINEX) 600 MG 12 hr tablet Take 1,200 mg by mouth 2 (two) times daily.    Yes [provider]  Ipratropium-Albuterol (COMBIVENT) 20-100 MCG/ACT AERS respimat Inhale 1 puff into the lungs every 6 (six) hours. Patient taking differently: Inhale 1 puff into the lungs every 6 (six) hours as needed for wheezing or shortness of breath.  05/31/17  Yes Aline August, MD  metoprolol tartrate (LOPRESSOR) 25 MG tablet Take 12.5 mg by mouth 2 (two) times daily.    Yes [provider]  ondansetron (ZOFRAN-ODT) 4 MG disintegrating tablet Take 4 mg by mouth every 8 (eight) hours as needed for nausea or vomiting.   Yes [provider]  Oxycodone HCl 10 MG TABS Take 10 mg by mouth every 4 (four) hours as needed (pain/shortness of breath).    Yes [provider]  pantoprazole (PROTONIX) 40 MG tablet Take 1 tablet (40 mg  total) by mouth 2 (two) times daily. 12/18/16  Yes Eugenie Filler, MD  Dextromethorphan-guaiFENesin (TUSSIN DM) 10-100 MG/5ML liquid Take 5 mLs by mouth every 12 (twelve) hours.    [provider]  fluconazole (DIFLUCAN) 100 MG tablet Take 1 tablet (100 mg total) by mouth daily. Patient not taking: Reported on 07/04/2017 03/20/17   Shelly Coss, MD  loratadine (CLARITIN) 10 MG tablet Take 10 mg by mouth daily.    [provider]  magnesium hydroxide (MILK OF MAGNESIA) 400 MG/5ML suspension Take 30 mLs by mouth daily. Patient not taking: Reported on 07/04/2017 06/11/17   Eugenie Filler, MD  Multiple Vitamins-Minerals (MULTIVITAMIN WITH MINERALS) tablet Take 1 tablet by mouth daily.    [provider]  predniSONE (DELTASONE) 20 MG tablet Take 40 mg by mouth daily with breakfast.    [provider]  senna (SENOKOT) 8.6 MG TABS tablet Take 1 tablet by mouth 2 (two) times daily.    [provider]    Family History Family History  Problem Relation Age of Onset  . Cancer Father   . Diabetes Mellitus II Mother     Social History Social History   Tobacco Use  . Smoking status: Current Some Day Smoker    Packs/day: 0.10    Years: 30.00    Pack years: 3.00    Types: Cigarettes  . Smokeless tobacco: Never Used  . Tobacco comment: 1-2 cig a day   Substance Use Topics  . Alcohol use: No    Frequency: Never  . Drug use: No     Allergies   Patient has no known allergies.   Review of Systems Review of Systems  All other systems reviewed and are negative.    Physical Exam Updated Vital Signs BP 121/73   Pulse 94   Temp (!) 97.5 F (36.4 C) (Oral)   Resp 19   SpO2 97%   Physical Exam  Constitutional: He is oriented to person, place, and time.  No respiratory distress on nasal oxygen.  HENT:  Head: Normocephalic and atraumatic.  Eyes: Conjunctivae are normal.  Neck: Neck supple.  Cardiovascular: Normal rate and regular  rhythm.  Pulmonary/Chest: Effort normal and breath sounds normal.  Normal respiratory effort, no rhonchi, wheezes, rales  Abdominal: Soft. Bowel sounds are normal.  Musculoskeletal: Normal range of motion.  Neurological: He is alert and oriented to person, place, and time.  Skin: Skin is warm and dry.  Psychiatric: He has a normal mood and affect. His behavior is normal.  Nursing note and vitals reviewed.    ED Treatments / Results  Labs (all labs ordered are listed, but only abnormal results are displayed) Labs Reviewed  CBC WITH DIFFERENTIAL/PLATELET - Abnormal; Notable for the following components:      Result Value   WBC 14.7 (*)    RBC 2.37 (*)    Hemoglobin 7.9 (*)    HCT 24.7 (*)    MCV 104.2 (*)    RDW 16.9 (*)    All other components within normal limits  BASIC METABOLIC PANEL - Abnormal; Notable for the following components:   Chloride 98 (*)    CO2 33 (*)    BUN 23 (*)    Creatinine, Ser 1.56 (*)    GFR calc non Af Amer 48 (*)    GFR calc Af Amer 55 (*)    All other components within normal limits  TROPONIN I - Abnormal; Notable for the following components:   Troponin I 0.08 (*)    All other components within normal limits    EKG EKG Interpretation  Date/Time:  Friday July 04 2017 07:26:56 EDT Ventricular Rate:  82 PR Interval:    QRS Duration: 98 QT Interval:  434 QTC Calculation: 507 R Axis:   123 Text Interpretation:  Sinus rhythm Right axis deviation Abnrm T, probable ischemia, anterolateral lds Prolonged QT interval Baseline wander in lead(s) V6 Confirmed by Nat Christen [phone]) on 07/04/2017 1:23:54 PM   Radiology Ct Abdomen Pelvis Wo Contrast  Result Date: 07/03/2017 CLINICAL DATA:  Abdominal distension, worsening shortness of breath, lung cancer, pulmonary embolism, COPD, CHF EXAM: CT ABDOMEN AND PELVIS WITHOUT CONTRAST TECHNIQUE: Multidetector CT imaging of the abdomen and pelvis was performed following the standard protocol without IV  contrast. Sagittal and coronal MPR images reconstructed from axial data set. Patient drank dilute oral contrast for exam COMPARISON:  12/16/2016 FINDINGS: Lower chest: Moderate RIGHT pleural effusion. Peribronchial thickening, more pronounced in LEFT lower lobe with question mucous plugging emphysematous changes at LEFT  base. Compressive atelectasis RIGHT lower lobe. Minimal LEFT pleural fluid. Hepatobiliary: Gallbladder and liver unremarkable Pancreas: Normal appearance Spleen: Normal appearance Adrenals/Urinary Tract: Adrenal glands, kidneys, ureters, and bladder normal appearance Stomach/Bowel: Visualization of proximal appendix in RIGHT pelvis, grossly unremarkable. Stool throughout colon. Large and small bowel loops normal appearance. Distended stomach with wall thickening of antrum and pylorus extending in the duodenal bulb could reflect gastritis. Vascular/Lymphatic: Atherosclerotic calcifications aorta and iliac arteries. Tip of central venous catheter at cavoatrial junction. No adenopathy. Reproductive: Minimal prostatic enlargement. Other: Low-attenuation free fluid in pelvis and minimally at scattered in upper abdomen. No free air. No hernia. Musculoskeletal: Sclerotic T12 vertebral body again identified, question due to osseous metastatic disease. Additional sclerotic focus at the inferior L2 vertebral body. Lytic lesion with sclerotic rim at inferior L3 unchanged. Metallic foreign body at inferior medial LEFT buttock. IMPRESSION: RIGHT pleural effusion and basilar atelectasis. Minimal LEFT pleural effusion and scattered ascites greatest in pelvis. Wall thickening of distal gastric antrum pylorus, could reflect gastritis. Suspected sclerotic metastatic lesions of T12 and L2 with an additional chronic well-circumscribed lytic focus at inferior L3. Electronically Signed   By: Lavonia Dana M.D.   On: 07/03/2017 13:02   Dg Chest 2 View  Result Date: 07/04/2017 CLINICAL DATA:  Shortness of breath EXAM:  CHEST - 2 VIEW COMPARISON:  July 04, 2017 FINDINGS: A right Port-A-Cath is in good position. Bilateral pleural effusions are stable. Stable left upper lobe mass, unchanged. Increased lucency in the left lung compared to the right is stable. Increased haziness on the right is stable compared to the left. No change in the cardiomediastinal silhouette. IMPRESSION: No interval change in bilateral pleural effusions, a left upper lobe mass, the right Port-A-Cath, or haziness in the right chest. Electronically Signed   By: Dorise Bullion III M.D   On: 07/04/2017 22:33   Dg Chest 2 View  Result Date: 07/04/2017 CLINICAL DATA:  Worsening shortness of Breath EXAM: CHEST - 2 VIEW COMPARISON:  06/30/2017 FINDINGS: Cardiac shadow is stable. Right chest wall port is again seen. Right pleural effusion is seen and stable from the previous CT examination. Stable left upper lobe mass lesion is noted. No acute bony abnormality is noted. IMPRESSION: Stable left upper lobe mass. Right-sided pleural effusion similar to that seen on recent CT examination. Electronically Signed   By: Inez Catalina M.D.   On: 07/04/2017 08:38    Procedures Procedures (including critical care time)  Medications Ordered in ED Medications  ipratropium-albuterol (DUONEB) 0.5-2.5 (3) MG/3ML nebulizer solution 3 mL (3 mLs Nebulization Given 07/04/17 0838)  methylPREDNISolone sodium succinate (SOLU-MEDROL) 125 mg/2 mL injection 80 mg (80 mg Intravenous Given 07/04/17 0935)  heparin lock flush 100 unit/mL (500 Units Intracatheter Given 07/04/17 1545)  ipratropium-albuterol (DUONEB) 0.5-2.5 (3) MG/3ML nebulizer solution 3 mL (3 mLs Nebulization Given 07/04/17 1441)     Initial Impression / Assessment and Plan / ED Course  I have reviewed the triage vital signs and the nursing notes.  Pertinent labs & imaging results that were available during my care of the patient were reviewed by me and considered in my medical decision making (see chart for  details).     Patient with known stage IV adenocarcinoma the lung and recent pulmonary embolism presents with shortness of breath.  He is stable on nasal oxygen.  Chest x-ray shows a stable left upper lobe mass and a right-sided pleural effusion all identified on a recent CT.  He is anemic, but this is not  new.  Patient was given IV steroids and nebulizer treatments.  He has remained stable after several hours of observation.  I do not believe he meets criteria for admission.  Final Clinical Impressions(s) / ED Diagnoses   Final diagnoses:  Adenocarcinoma of lung, unspecified laterality ([place])  Shortness of breath    ED Discharge Orders    None       Nat Christen, MD 07/05/17 [phone]

## 2017-07-04 NOTE — ED Notes (Signed)
Patient became SOB.  He had taken his oxygen off, but MD was notified and a breathing treatment was ordered.  Patient alert, oriented, comfortable while getting his breathing treatment.

## 2017-07-04 NOTE — ED Triage Notes (Signed)
Pt comes from [facility] via [facility], pt c/o SOB chronic hx of COPD lung cancer stage 4. Pt on 4 liters of 02, v/s on arrival 145/98, pulse 84, rr18, SpO2 99 on 4 liters.    Alert x4. No allergies, hx of HTN, seizures, GERD, ETOH abuse.

## 2017-07-04 NOTE — ED Notes (Signed)
CRITICAL VALUE STICKER  CRITICAL VALUE: 0.08 Troponin  RECEIVER (on-site recipient of call): ABrawner,RN  DATE & TIME NOTIFIED: 07/04/17  MESSENGER (representative from lab): K.Hill  MD NOTIFIED:  BCook,MD  TIME OF NOTIFICATION: 07/04/17  RESPONSE: See orders

## 2017-07-04 NOTE — ED Notes (Signed)
[facility] called for patient transport home due to patient need for continuous oxygen

## 2017-07-04 NOTE — Discharge Instructions (Addendum)
Continue nebulizer treatments and oxygen as needed.  Follow-up your cancer doctor.

## 2017-07-04 NOTE — ED Notes (Signed)
Pt ambulated in hallway pt complained of shortness of breath and had to sit down pt o2 dropped to 83 RN and PA made aware

## 2017-07-04 NOTE — ED Notes (Signed)
Bed: BB40 Expected date:  Expected time:  Means of arrival:  Comments: EMS lung cancer/resp distress

## 2017-07-04 NOTE — H&P (Signed)
History and Physical    DRAPER GALLON XTK:[id number] DOB: Feb 13, 1960 DOA: 07/04/2017  PCP: [name], MD  Patient coming from: Home  I have personally briefly reviewed patient's old medical records in [place]  Chief Complaint: Worsening Dyspnea  HPI: [name] is a 57 y.o. male with medical history significant of stage 4 adenocarcinoma of the lung, COPD on 2L home O2 at baseline, HTN, CHF.  Patient was admitted on 6/5-6/9 for COPD exacerbation.  Then readmitted 6/10-6/13 for LUL PE (see DC summary from yesterday).  He was started on eliquis, discharged home yesterday.  Today he was at home, went outside.  Woke up at 3:30 with severe SOB, chest pressure.  He reports that he has severe DOE, feeling like he is going to pass out when he walks.  Presents to ED.   ED Course: After solumedrol on ambulance and numerous neb treatments: he has no wheezing, but desats to the low 80s with ambulation despite 2L home O2 [place].  Hospitalist asked to readmit.   Review of Systems: As per HPI otherwise 10 point review of systems negative.   Past Medical History:  Diagnosis Date  . Abdominal pain 06/04/2016  . Adenocarcinoma of left lung, stage 4 ([place]) 05/02/2016  . Alcohol abuse   . Bronchitis due to tobacco use ([place])   . Cancer ([place])    Lung  . COPD (chronic obstructive pulmonary disease) ([place])   . Dehydration 06/04/2016  . Encounter for antineoplastic chemotherapy 05/02/2016  . Gastritis   . Goals of care, counseling/discussion 05/02/2016  . Hematemesis   . HTN (hypertension) 10/30/2016  . Recurrent upper respiratory infection (URI)   . Seizures ([place]) 05/2011   new onset  . Shortness of breath     Past Surgical History:  Procedure Laterality Date  . ESOPHAGOGASTRODUODENOSCOPY (EGD) WITH PROPOFOL N/A 12/17/2016   Procedure: ESOPHAGOGASTRODUODENOSCOPY (EGD) WITH PROPOFOL;  Surgeon: Ladene Artist, MD;  Location: [facility];  Service: Endoscopy;  Laterality: N/A;  .  IR FLUORO GUIDE PORT INSERTION RIGHT  05/13/2016  . IR US GUIDE VASC ACCESS RIGHT  05/13/2016  . NO PAST SURGERIES       reports that he has been smoking cigarettes.  He has a 3.00 pack-year smoking history. He has never used smokeless tobacco. He reports that he does not drink alcohol or use drugs.  No Known Allergies  Family History  Problem Relation Age of Onset  . Cancer Father   . Diabetes Mellitus II Mother      Prior to Admission medications   Medication Sig Start Date End Date Taking? Authorizing Provider  acetaminophen (TYLENOL) 500 MG tablet Take 1,000 mg by mouth 2 (two) times daily as needed for mild pain.    [provider]  albuterol (PROVENTIL) (2.5 MG/3ML) 0.083% nebulizer solution 1 neb every 4-6 hours as needed for wheezing and shortness of breath Patient taking differently: Take 2.5 mg by nebulization every 4 (four) hours as needed for wheezing or shortness of breath.  04/01/17   Parrett, Fonnie Mu, NP  apixaban (ELIQUIS) 5 MG TABS tablet Take 2 tablets (10 mg total) by mouth 2 (two) times daily for 5 days. 07/03/17 07/08/17  Florencia Reasons, MD  apixaban (ELIQUIS) 5 MG TABS tablet Take 1 tablet (5 mg total) by mouth 2 (two) times daily. 07/08/17   Florencia Reasons, MD  Chlorphen-Phenyleph-ASA (ALKA-SELTZER PLUS COLD PO) Take 2 tablets by mouth at bedtime as needed (COUGH).    [provider]  dexamethasone (DECADRON) 4 MG tablet Take 4 mg by mouth 2 (two) times daily. The day before, the day of, and the day after chemotherapy every three weeks    [provider]  escitalopram (LEXAPRO) 5 MG tablet Take 1 tablet (5 mg total) by mouth at bedtime. 04/17/17   Rosita Fire, MD  feeding supplement, ENSURE ENLIVE, (ENSURE ENLIVE) LIQD Take 237 mLs by mouth 3 (three) times daily between meals. 03/19/17   Shelly Coss, MD  fluconazole (DIFLUCAN) 100 MG tablet Take 1 tablet (100 mg total) by mouth daily. Patient not taking: Reported on 07/04/2017 03/20/17   Shelly Coss, MD  Fluticasone-Umeclidin-Vilant (TRELEGY ELLIPTA) 100-62.5-25 MCG/INH AEPB Inhale 1 puff into the lungs daily. 05/31/17   Aline August, MD  furosemide (LASIX) 20 MG tablet Take 1 tablet (20 mg total) by mouth daily. 06/10/17   Eugenie Filler, MD  guaiFENesin (MUCINEX) 600 MG 12 hr tablet Take 1,200 mg by mouth 2 (two) times daily.     [provider]  Ipratropium-Albuterol (COMBIVENT) 20-100 MCG/ACT AERS respimat Inhale 1 puff into the lungs every 6 (six) hours. Patient taking differently: Inhale 1 puff into the lungs every 6 (six) hours as needed for wheezing or shortness of breath.  05/31/17   Aline August, MD  loratadine (CLARITIN) 10 MG tablet Take 10 mg by mouth daily.    [provider]  magnesium hydroxide (MILK OF MAGNESIA) 400 MG/5ML suspension Take 30 mLs by mouth daily. Patient not taking: Reported on 07/04/2017 06/11/17   Eugenie Filler, MD  metoprolol tartrate (LOPRESSOR) 25 MG tablet Take 12.5 mg by mouth 2 (two) times daily.     [provider]  ondansetron (ZOFRAN-ODT) 4 MG disintegrating tablet Take 4 mg by mouth every 8 (eight) hours as needed for nausea or vomiting.    [provider]  Oxycodone HCl 10 MG TABS Take 10 mg by mouth every 4 (four) hours as needed (pain/shortness of breath).     [provider]  pantoprazole (PROTONIX) 40 MG tablet Take 1 tablet (40 mg total) by mouth 2 (two) times daily. 12/18/16   Eugenie Filler, MD    Physical Exam: Vitals:   07/04/17 2100 07/04/17 2145 07/04/17 2314 07/04/17 2325  BP: 126/79 121/81 (!) 150/86   Pulse: 69 79 80   Resp: 18 (!) 21 20   Temp:      TempSrc:      SpO2: 97% 99% 100% 100%    Constitutional: NAD, calm, comfortable Eyes: PERRL, lids and conjunctivae normal ENMT: Mucous membranes are moist. Posterior pharynx clear of any exudate or lesions.Normal dentition.  Neck: normal, supple, no masses, no thyromegaly Respiratory: CTA B on my  exam Cardiovascular: Regular rate and rhythm, no murmurs / rubs / gallops. No extremity edema. 2+ pedal pulses. No carotid bruits.  Abdomen: no tenderness, no masses palpated. No hepatosplenomegaly. Bowel sounds positive.  Musculoskeletal: no clubbing / cyanosis. No joint deformity upper and lower extremities. Good ROM, no contractures. Normal muscle tone.  Skin: no rashes, lesions, ulcers. No induration Neurologic: CN 2-12 grossly intact. Sensation intact, DTR normal. Strength 5/5 in all 4.  Psychiatric: Normal judgment and insight. Alert and oriented x 3. Normal mood.    Labs on Admission: I have personally reviewed following labs and imaging studies  CBC: Recent Labs  Lab 06/29/17 0644 06/30/17 0329 07/01/17 0052 07/01/17 0825 07/03/17 0831 07/04/17 0838  WBC 15.8* 18.1* 15.2*  --  12.8* 14.7*  NEUTROABS  --  15.9*  --   --  9.6* 11.3  HGB 8.4* 9.1* 8.3*  --  8.2* 7.9*  HCT 26.1* 28.3* 25.9* 25.8* 25.2* 24.7*  MCV 101.6* 102.2* 100.8*  --  103.7* 104.2*  PLT 322 311 248  --  201 503   Basic Metabolic Panel: Recent Labs  Lab 06/28/17 0604 06/30/17 0329 07/01/17 0052 07/03/17 0831 07/04/17 0838  NA 141 142 142 141 140  K 4.5 4.1 3.8 3.2* 3.5  CL 103 101 100* 98* 98*  CO2 31 33* 34* 34* 33*  GLUCOSE 127* 143* 130* 90 87  BUN 23* 29* 30* 18 23*  CREATININE 1.19 1.40* 1.49* 1.37* 1.56*  CALCIUM 9.0 9.4 9.1 8.6* 9.0   GFR: Estimated Creatinine Clearance: 39.2 mL/min (A) (by C-G formula based on SCr of 1.56 mg/dL (H)). Liver Function Tests: Recent Labs  Lab 06/28/17 0604 07/03/17 0831  AST 27 25  ALT 31 20  ALKPHOS 86 87  BILITOT 0.2* 0.6  PROT 5.7* 5.6*  ALBUMIN 2.7* 2.8*   No results for input(s): LIPASE, AMYLASE in the last 168 hours. No results for input(s): AMMONIA in the last 168 hours. Coagulation Profile: Recent Labs  Lab 06/30/17 0852  INR 1.01   Cardiac Enzymes: Recent Labs  Lab 06/30/17 1258 06/30/17 1823 07/01/17 0052 07/04/17 0838   TROPONINI 0.23* 0.28* 0.18* 0.08*   BNP (last 3 results) No results for input(s): PROBNP in the last 8760 hours. HbA1C: No results for input(s): HGBA1C in the last 72 hours. CBG: No results for input(s): GLUCAP in the last 168 hours. Lipid Profile: No results for input(s): CHOL, HDL, LDLCALC, TRIG, CHOLHDL, LDLDIRECT in the last 72 hours. Thyroid Function Tests: No results for input(s): TSH, T4TOTAL, FREET4, T3FREE, THYROIDAB in the last 72 hours. Anemia Panel: No results for input(s): VITAMINB12, FOLATE, FERRITIN, TIBC, IRON, RETICCTPCT in the last 72 hours. Urine analysis:    Component Value Date/Time   COLORURINE YELLOW 05/20/2017 0940   APPEARANCEUR CLEAR 05/20/2017 0940   LABSPEC 1.015 05/20/2017 0940   PHURINE 5.0 05/20/2017 0940   GLUCOSEU NEGATIVE 05/20/2017 0940   HGBUR NEGATIVE 05/20/2017 0940   BILIRUBINUR NEGATIVE 05/20/2017 0940   KETONESUR 5 (A) 05/20/2017 0940   PROTEINUR 30 (A) 05/20/2017 0940   UROBILINOGEN 0.2 05/29/2011 0122   NITRITE NEGATIVE 05/20/2017 0940   LEUKOCYTESUR NEGATIVE 05/20/2017 0940    Radiological Exams on Admission: Ct Abdomen Pelvis Wo Contrast  Result Date: 07/03/2017 CLINICAL DATA:  Abdominal distension, worsening shortness of breath, lung cancer, pulmonary embolism, COPD, CHF EXAM: CT ABDOMEN AND PELVIS WITHOUT CONTRAST TECHNIQUE: Multidetector CT imaging of the abdomen and pelvis was performed following the standard protocol without IV contrast. Sagittal and coronal MPR images reconstructed from axial data set. Patient drank dilute oral contrast for exam COMPARISON:  12/16/2016 FINDINGS: Lower chest: Moderate RIGHT pleural effusion. Peribronchial thickening, more pronounced in LEFT lower lobe with question mucous plugging emphysematous changes at LEFT base. Compressive atelectasis RIGHT lower lobe. Minimal LEFT pleural fluid. Hepatobiliary: Gallbladder and liver unremarkable Pancreas: Normal appearance Spleen: Normal appearance  Adrenals/Urinary Tract: Adrenal glands, kidneys, ureters, and bladder normal appearance Stomach/Bowel: Visualization of proximal appendix in RIGHT pelvis, grossly unremarkable. Stool throughout colon. Large and small bowel loops normal appearance. Distended stomach with wall thickening of antrum and pylorus extending in the duodenal bulb could reflect gastritis. Vascular/Lymphatic: Atherosclerotic calcifications aorta and iliac arteries. Tip of central venous catheter at cavoatrial junction. No adenopathy. Reproductive: Minimal prostatic enlargement. Other: Low-attenuation free fluid in pelvis and  minimally at scattered in upper abdomen. No free air. No hernia. Musculoskeletal: Sclerotic T12 vertebral body again identified, question due to osseous metastatic disease. Additional sclerotic focus at the inferior L2 vertebral body. Lytic lesion with sclerotic rim at inferior L3 unchanged. Metallic foreign body at inferior medial LEFT buttock. IMPRESSION: RIGHT pleural effusion and basilar atelectasis. Minimal LEFT pleural effusion and scattered ascites greatest in pelvis. Wall thickening of distal gastric antrum pylorus, could reflect gastritis. Suspected sclerotic metastatic lesions of T12 and L2 with an additional chronic well-circumscribed lytic focus at inferior L3. Electronically Signed   By: Lavonia Dana M.D.   On: 07/03/2017 13:02   Dg Chest 2 View  Result Date: 07/04/2017 CLINICAL DATA:  Shortness of breath EXAM: CHEST - 2 VIEW COMPARISON:  July 04, 2017 FINDINGS: A right Port-A-Cath is in good position. Bilateral pleural effusions are stable. Stable left upper lobe mass, unchanged. Increased lucency in the left lung compared to the right is stable. Increased haziness on the right is stable compared to the left. No change in the cardiomediastinal silhouette. IMPRESSION: No interval change in bilateral pleural effusions, a left upper lobe mass, the right Port-A-Cath, or haziness in the right chest.  Electronically Signed   By: Dorise Bullion III M.D   On: 07/04/2017 22:33   Dg Chest 2 View  Result Date: 07/04/2017 CLINICAL DATA:  Worsening shortness of Breath EXAM: CHEST - 2 VIEW COMPARISON:  06/30/2017 FINDINGS: Cardiac shadow is stable. Right chest wall port is again seen. Right pleural effusion is seen and stable from the previous CT examination. Stable left upper lobe mass lesion is noted. No acute bony abnormality is noted. IMPRESSION: Stable left upper lobe mass. Right-sided pleural effusion similar to that seen on recent CT examination. Electronically Signed   By: [place] Catalina M.D.   On: 07/04/2017 08:38    EKG: Independently reviewed.  Assessment/Plan Principal Problem:   Acute on chronic respiratory failure with hypoxia (HCC) Active Problems:   Adenocarcinoma of left lung, stage 4 (HCC)   COPD (chronic obstructive pulmonary disease) (HCC)   HTN (hypertension)   CKD (chronic kidney disease), stage III (HCC)   Chronic combined systolic (congestive) and diastolic (congestive) heart failure (HCC)   PE (pulmonary thromboembolism) ([place])    1. Acute on chronic resp failure with hypoxia - 1. Desaturation with activity likely due to PE on top of COPD and NSCLC 2. O2 via [place] 3. Continue Elquis 4. Continue home meds for COPD 5. Patient wants re-admission right now so will re-admit, not really sure there is much more we can do in hospital for him though. 6. Dont think hes a candidate for catheter directed TPA for the LUL PE as the primary tumor of the NSCLC is located there (I think TPAing the tumor would probably make it bleed). 7. Maybe drain the ascites / small plerual effusion on R?  Will defer this to day team though.  Effusion size is pretty small to drain, not sure how much benefit this would provide. 2. HTN - continue home BP meds 3. CHF - continue lasix 4. COPD - continue home meds 5. PE - continue eliquis 6. CKD stage 3 - chronic and stable 7. NSCLC - Pal chemo with  onc  DVT prophylaxis: Eliquis Code Status: Full Family Communication: No family in room Disposition Plan: Home after admit Consults called: None Admission status: Admit to inpatient   Etta Quill DO [facility] Pager [phone] Only works nights!  If 7AM-7PM, please contact  the primary day team physician taking care of patient  [email or web address] Password [facility]  07/04/2017, 11:56 PM

## 2017-07-05 ENCOUNTER — Inpatient Hospital Stay (HOSPITAL_COMMUNITY): Payer: Medicaid Other

## 2017-07-05 ENCOUNTER — Other Ambulatory Visit: Payer: Self-pay

## 2017-07-05 HISTORY — PX: THORACENTESIS: SHX235

## 2017-07-05 LAB — BODY FLUID CELL COUNT WITH DIFFERENTIAL
Eos, Fluid: 0 %
Lymphs, Fluid: 71 %
Monocyte-Macrophage-Serous Fluid: 15 % — ABNORMAL LOW (ref 50–90)
Neutrophil Count, Fluid: 14 % (ref 0–25)
Total Nucleated Cell Count, Fluid: 333 cu mm (ref 0–1000)

## 2017-07-05 LAB — LACTATE DEHYDROGENASE, PLEURAL OR PERITONEAL FLUID: LD FL: 293 U/L — AB (ref 3–23)

## 2017-07-05 LAB — PROTEIN, PLEURAL OR PERITONEAL FLUID: TOTAL PROTEIN, FLUID: 3.5 g/dL

## 2017-07-05 MED ORDER — IPRATROPIUM-ALBUTEROL 0.5-2.5 (3) MG/3ML IN SOLN
3.0000 mL | Freq: Four times a day (QID) | RESPIRATORY_TRACT | Status: DC
Start: 2017-07-05 — End: 2017-07-07
  Administered 2017-07-05 – 2017-07-07 (×9): 3 mL via RESPIRATORY_TRACT
  Filled 2017-07-05 (×9): qty 3

## 2017-07-05 MED ORDER — SENNOSIDES-DOCUSATE SODIUM 8.6-50 MG PO TABS
1.0000 | ORAL_TABLET | Freq: Two times a day (BID) | ORAL | Status: DC
  Administered 2017-07-06 – 2017-07-08 (×3): 1 via ORAL
  Filled 2017-07-05 (×6): qty 1

## 2017-07-05 MED ORDER — FLUTICASONE FUROATE-VILANTEROL 100-25 MCG/INH IN AEPB
1.0000 | INHALATION_SPRAY | Freq: Every day | RESPIRATORY_TRACT | Status: DC
  Administered 2017-07-05 – 2017-07-09 (×5): 1 via RESPIRATORY_TRACT
  Filled 2017-07-05: qty 28

## 2017-07-05 MED ORDER — LIDOCAINE HCL 1 % IJ SOLN
INTRAMUSCULAR | Status: AC
  Administered 2017-07-05: 13:00:00
  Filled 2017-07-05: qty 20

## 2017-07-05 MED ORDER — POTASSIUM CHLORIDE CRYS ER 20 MEQ PO TBCR
40.0000 meq | EXTENDED_RELEASE_TABLET | Freq: Once | ORAL | Status: AC
  Administered 2017-07-05: 40 meq via ORAL
  Filled 2017-07-05: qty 2

## 2017-07-05 MED ORDER — UMECLIDINIUM BROMIDE 62.5 MCG/INH IN AEPB
1.0000 | INHALATION_SPRAY | Freq: Every day | RESPIRATORY_TRACT | Status: DC
  Administered 2017-07-05 – 2017-07-09 (×5): 1 via RESPIRATORY_TRACT
  Filled 2017-07-05: qty 7

## 2017-07-05 NOTE — ED Notes (Signed)
ED TO INPATIENT HANDOFF REPORT  Name/Age/Gender [name] 57 y.o. male  Code Status    Code Status Orders  (From admission, onward)        Start     Ordered   07/04/17 2356  Do not attempt resuscitation (DNR)  Continuous    Question Answer Comment  In the event of cardiac or respiratory ARREST Do not call a "code blue"   In the event of cardiac or respiratory ARREST Do not perform Intubation, CPR, defibrillation or ACLS   In the event of cardiac or respiratory ARREST Use medication by any route, position, wound care, and other measures to relive pain and suffering. May use oxygen, suction and manual treatment of airway obstruction as needed for comfort.      07/04/17 2356    Code Status History    Date Active Date Inactive Code Status Order ID Comments User Context   06/30/2017 1015 07/04/2017 0215 DNR [id number]  Shelly Coss, MD ED   06/25/2017 1117 06/29/2017 1603 Full Code [id number]  Shelly Coss, MD ED   06/21/2017 1044 06/23/2017 2044 DNR [id number]  Mariel Aloe, MD ED   06/05/2017 1602 06/10/2017 2253 Full Code [id number]  Purohit, Konrad Dolores, MD Inpatient   05/28/2017 1147 05/31/2017 2012 DNR [id number]  Nita Sells, MD ED   04/10/2017 1148 04/17/2017 1837 Full Code [id number]  Damita Lack, MD ED   03/09/2017 2359 03/19/2017 1931 Full Code [id number]  Cristy Folks, MD Inpatient   02/14/2017 0902 02/16/2017 1455 Full Code [id number]  Florencia Reasons, MD Inpatient   02/10/2017 0031 02/14/2017 0902 DNR [id number]  Norval Morton, MD ED   12/30/2016 2325 01/07/2017 2109 DNR [id number]  Elodia [name]., MD Inpatient   12/17/2016 0934 12/24/2016 1811 DNR [id number]  Thurnell Lose, MD Inpatient   12/16/2016 1649 12/17/2016 0934 Full Code [id number]  Charlynne Cousins, MD Inpatient      Home/SNF/Other Skilled nursing facility  Chief Complaint shortness of breath  Level of Care/Admitting Diagnosis ED Disposition    ED Disposition Condition [facility] Area: [facility] [[id number]]  Level of Care: Telemetry [5]  Admit to tele based on following criteria: Other see comments  Comments: PE  Diagnosis: Acute on chronic respiratory failure with hypoxia [facility]) [[id number]]  Admitting Physician: Doreatha Massed  Attending Physician: Etta Quill [phone]  Estimated length of stay: past midnight tomorrow  Certification:: I certify this patient will need inpatient services for at least 2 midnights  PT Class (Do Not Modify): Inpatient [101]  PT Acc Code (Do Not Modify): Private [1]       Medical History Past Medical History:  Diagnosis Date  . Abdominal pain 06/04/2016  . Adenocarcinoma of left lung, stage 4 ([place]) 05/02/2016  . Alcohol abuse   . Bronchitis due to tobacco use ([place])   . Cancer ([place])    Lung  . COPD (chronic obstructive pulmonary disease) ([place])   . Dehydration 06/04/2016  . Encounter for antineoplastic chemotherapy 05/02/2016  . Gastritis   . Goals of care, counseling/discussion 05/02/2016  . Hematemesis   . HTN (hypertension) 10/30/2016  . Recurrent upper respiratory infection (URI)   . Seizures ([place]) 05/2011   new onset  . Shortness of breath     Allergies No Known Allergies  IV Location/Drains/Wounds Patient Lines/Drains/Airways Status   Active Line/Drains/Airways    Name:   Placement date:   Placement time:   Site:  Days:   Implanted Port 05/13/16 Right Chest   05/13/16    -    Chest   418          Labs/Imaging Results for orders placed or performed during the hospital encounter of 07/04/17 (from the past 48 hour(s))  I-Stat Troponin, ED (not at [facility])     Status: None   Collection Time: 07/04/17  9:13 PM  Result Value Ref Range   Troponin i, poc 0.04 0.00 - 0.08 ng/mL   Comment 3            Comment: Due to the release kinetics of cTnI, a negative result within the first hours of the onset of symptoms does not rule out myocardial infarction with certainty. If myocardial  infarction is still suspected, repeat the test at appropriate intervals.   Brain natriuretic peptide     Status: Abnormal   Collection Time: 07/04/17  9:46 PM  Result Value Ref Range   B Natriuretic Peptide 168.6 (H) 0.0 - 100.0 pg/mL    Comment: Performed at [facility], [place] [address], [place],  [address]   Ct Abdomen Pelvis Wo Contrast  Result Date: 07/03/2017 CLINICAL DATA:  Abdominal distension, worsening shortness of breath, lung cancer, pulmonary embolism, COPD, CHF EXAM: CT ABDOMEN AND PELVIS WITHOUT CONTRAST TECHNIQUE: Multidetector CT imaging of the abdomen and pelvis was performed following the standard protocol without IV contrast. Sagittal and coronal MPR images reconstructed from axial data set. Patient drank dilute oral contrast for exam COMPARISON:  12/16/2016 FINDINGS: Lower chest: Moderate RIGHT pleural effusion. Peribronchial thickening, more pronounced in LEFT lower lobe with question mucous plugging emphysematous changes at LEFT base. Compressive atelectasis RIGHT lower lobe. Minimal LEFT pleural fluid. Hepatobiliary: Gallbladder and liver unremarkable Pancreas: Normal appearance Spleen: Normal appearance Adrenals/Urinary Tract: Adrenal glands, kidneys, ureters, and bladder normal appearance Stomach/Bowel: Visualization of proximal appendix in RIGHT pelvis, grossly unremarkable. Stool throughout colon. Large and small bowel loops normal appearance. Distended stomach with wall thickening of antrum and pylorus extending in the duodenal bulb could reflect gastritis. Vascular/Lymphatic: Atherosclerotic calcifications aorta and iliac arteries. Tip of central venous catheter at cavoatrial junction. No adenopathy. Reproductive: Minimal prostatic enlargement. Other: Low-attenuation free fluid in pelvis and minimally at scattered in upper abdomen. No free air. No hernia. Musculoskeletal: Sclerotic T12 vertebral body again identified, question due to osseous  metastatic disease. Additional sclerotic focus at the inferior L2 vertebral body. Lytic lesion with sclerotic rim at inferior L3 unchanged. Metallic foreign body at inferior medial LEFT buttock. IMPRESSION: RIGHT pleural effusion and basilar atelectasis. Minimal LEFT pleural effusion and scattered ascites greatest in pelvis. Wall thickening of distal gastric antrum pylorus, could reflect gastritis. Suspected sclerotic metastatic lesions of T12 and L2 with an additional chronic well-circumscribed lytic focus at inferior L3. Electronically Signed   By: Lavonia Dana M.D.   On: 07/03/2017 13:02   Dg Chest 2 View  Result Date: 07/04/2017 CLINICAL DATA:  Shortness of breath EXAM: CHEST - 2 VIEW COMPARISON:  July 04, 2017 FINDINGS: A right Port-A-Cath is in good position. Bilateral pleural effusions are stable. Stable left upper lobe mass, unchanged. Increased lucency in the left lung compared to the right is stable. Increased haziness on the right is stable compared to the left. No change in the cardiomediastinal silhouette. IMPRESSION: No interval change in bilateral pleural effusions, a left upper lobe mass, the right Port-A-Cath, or haziness in the right chest. Electronically Signed   By: Dorise Bullion III M.D  On: 07/04/2017 22:33   Dg Chest 2 View  Result Date: 07/04/2017 CLINICAL DATA:  Worsening shortness of Breath EXAM: CHEST - 2 VIEW COMPARISON:  06/30/2017 FINDINGS: Cardiac shadow is stable. Right chest wall port is again seen. Right pleural effusion is seen and stable from the previous CT examination. Stable left upper lobe mass lesion is noted. No acute bony abnormality is noted. IMPRESSION: Stable left upper lobe mass. Right-sided pleural effusion similar to that seen on recent CT examination. Electronically Signed   By: [place] Catalina M.D.   On: 07/04/2017 08:38    Pending Labs Unresulted Labs (From admission, onward)   None      Vitals/Pain Today's Vitals   07/04/17 2100 07/04/17 2145  07/04/17 2314 07/04/17 2325  BP: 126/79 121/81 (!) 150/86   Pulse: 69 79 80   Resp: 18 (!) 21 20   Temp:      TempSrc:      SpO2: 97% 99% 100% 100%  PainSc:        Isolation Precautions No active isolations  Medications Medications  albuterol (PROVENTIL) (2.5 MG/3ML) 0.083% nebulizer solution 2.5 mg (has no administration in time range)  apixaban (ELIQUIS) tablet 10 mg (has no administration in time range)  apixaban (ELIQUIS) tablet 5 mg (has no administration in time range)  feeding supplement (ENSURE ENLIVE) (ENSURE ENLIVE) liquid 237 mL (has no administration in time range)  magnesium hydroxide (MILK OF MAGNESIA) suspension 30 mL (has no administration in time range)  metoprolol tartrate (LOPRESSOR) tablet 12.5 mg (has no administration in time range)  loratadine (CLARITIN) tablet 10 mg (has no administration in time range)  Ipratropium-Albuterol (COMBIVENT) respimat 1 puff (has no administration in time range)  furosemide (LASIX) tablet 20 mg (has no administration in time range)  Fluticasone-Umeclidin-Vilant 100-62.5-25 MCG/INH AEPB 1 puff (has no administration in time range)  Oxycodone HCl TABS 10 mg (has no administration in time range)  pantoprazole (PROTONIX) EC tablet 40 mg (has no administration in time range)  escitalopram (LEXAPRO) tablet 5 mg (has no administration in time range)  guaiFENesin (MUCINEX) 12 hr tablet 1,200 mg (has no administration in time range)  acetaminophen (TYLENOL) tablet 650 mg (has no administration in time range)    Or  acetaminophen (TYLENOL) suppository 650 mg (has no administration in time range)  ondansetron (ZOFRAN) tablet 4 mg (has no administration in time range)    Or  ondansetron (ZOFRAN) injection 4 mg (has no administration in time range)  ipratropium-albuterol (DUONEB) 0.5-2.5 (3) MG/3ML nebulizer solution 3 mL (3 mLs Nebulization Given 07/04/17 2117)  albuterol (PROVENTIL,VENTOLIN) solution continuous neb (5 mg/hr Nebulization  Given 07/04/17 2325)    Mobility walks with device

## 2017-07-05 NOTE — Progress Notes (Cosign Needed)
Patient declines many things. Patient currently declines to ambulate however, agreed to do it later.  Patient is currently resting; continue with current plan of care.

## 2017-07-05 NOTE — Progress Notes (Cosign Needed)
1655 tried to ambulate pt with pulse ox reading. Pt refused stating "No, I am in pain from draining the fluid and don't want to do anything."

## 2017-07-05 NOTE — Progress Notes (Signed)
PROGRESS NOTE  [name]:[id number] DOB: Apr 11, 1960 DOA: 07/04/2017 PCP: [name], MD  HPI/Recap of past 24 hours:  He is in 2.5 liter oxygen , no hypoxia at rest He is Sitting up on the edge of bed, does not appear to be in distress, no cough noticed He denies chest pain  Assessment/Plan: Principal Problem:   Acute on chronic respiratory failure with hypoxia (HCC) Active Problems:   Adenocarcinoma of left lung, stage 4 (HCC)   COPD (chronic obstructive pulmonary disease) (HCC)   HTN (hypertension)   CKD (chronic kidney disease), stage III (HCC)   Chronic combined systolic (congestive) and diastolic (congestive) heart failure (HCC)   PE (pulmonary thromboembolism) (HCC)  Acute on chronic hypoxic respiratory failure -likely multifactorial, he has copd, stage IV lung cancer, recently diagnosed PE vs tumor emboli , per cta on 6/10 (acute pulmonary thrombus at the left main pulmonary artery bifurcation and extending into the left upper lobe. 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 b12/folate/tsh unremarkable, he is followed by hematology/oncology - Hgb stable  Essential hypertension -he is on lopressor  H/oerosive gastropathy/gastritis  Recent EGD by [facility] Dr Fuller Plan on 11/2016 No acute issues,Continue ppi bid.   UQJ:[id number] is his 9th hospitalization this year palliative care following From ALF    Over all poor prognosis, I explained to him that his stage IV cancer is not curable, he is getting palliative chemo as long as his is able to tolerate it I explained to him that his life expectancy is limited, he need to continue to discuss with palliative care and consider hospice and focus care on comfort, he expressed understanding, he said he need to let his family know about this, palliative care consulted  Code Status: DNR  Family Communication: patient   Disposition Plan: return to [place] pending respiratory status improvement   Consultants:  Palliative care  IR  Procedures:  US guided thoracentesis on the right  Antibiotics:  none   Objective: BP 114/61 (BP Location: Right Arm)   Pulse 69   Temp 97.7 F (36.5 C)   Resp 18   SpO2 98%   Intake/Output Summary (Last 24 hours) at 07/05/2017 1029 Last data filed at 07/05/2017 0630 Gross per 24 hour  Intake -  Output 300 ml  Net -300 ml   There were no vitals filed for this visit.  Exam: Patient is examined daily including today on 07/05/2017, exams remain the same as of yesterday except that has  changed    General:  Chronically ill appearing, NAD  Cardiovascular: RRR  Respiratory: diminished, but no wheezing, no rales, no rhonchi  Abdomen: Soft/ND/NT, positive BS  Musculoskeletal: No Edema  Neuro: alert, oriented   Data Reviewed: Basic Metabolic Panel: Recent Labs  Lab 06/30/17 0329 07/01/17 0052  07/03/17 0831 07/04/17 0838  NA 142 142 141 140  K 4.1 3.8 3.2* 3.5  CL 101 100* 98* 98*  CO2 33* 34* 34* 33*  GLUCOSE 143* 130* 90 87  BUN 29* 30* 18 23*  CREATININE 1.40* 1.49* 1.37* 1.56*  CALCIUM 9.4 9.1 8.6* 9.0   Liver Function Tests: Recent Labs  Lab 07/03/17 0831  AST 25  ALT 20  ALKPHOS 87  BILITOT 0.6  PROT 5.6*  ALBUMIN 2.8*   No results for input(s): LIPASE, AMYLASE in the last 168 hours. No results for input(s): AMMONIA in the last 168 hours. CBC: Recent Labs  Lab 06/29/17 0644 06/30/17 0329 07/01/17 0052 07/01/17 0825 07/03/17 0831 07/04/17 0838  WBC 15.8* 18.1* 15.2*  --  12.8* 14.7*  NEUTROABS  --  15.9*  --   --  9.6* 11.3  HGB 8.4* 9.1* 8.3*  --  8.2* 7.9*  HCT 26.1* 28.3* 25.9* 25.8* 25.2* 24.7*  MCV 101.6* 102.2* 100.8*  --  103.7* 104.2*  PLT 322 311 248  --  201 194   Cardiac Enzymes:   Recent Labs  Lab 06/30/17 1258 06/30/17 1823 07/01/17 0052 07/04/17 0838  TROPONINI 0.23* 0.28* 0.18* 0.08*   BNP (last 3 results) Recent Labs    06/25/17 0750 06/30/17 0812 07/04/17 2146  BNP 89.9 224.5* 168.6*    ProBNP (last 3 results) No results for input(s): PROBNP in the last 8760 hours.  CBG: No results for input(s): GLUCAP in the last 168 hours.  Recent Results (from the past 240 hour(s))  MRSA PCR Screening     Status: None   Collection Time: 06/30/17  7:24 PM  Result Value Ref Range Status   MRSA by PCR NEGATIVE NEGATIVE Final    Comment:        The GeneXpert MRSA Assay (FDA approved for NASAL specimens only), is one component of a comprehensive MRSA colonization surveillance program. It is not intended to diagnose MRSA infection nor to guide or monitor treatment for MRSA infections. Performed at [facility], [place] [address], [place], [place] [address]      Studies: Dg Chest 2 View  Result Date: 07/04/2017 CLINICAL DATA:  Shortness of breath EXAM: CHEST - 2 VIEW COMPARISON:  July 04, 2017 FINDINGS:  A right Port-A-Cath is in good position. Bilateral pleural effusions are stable. Stable left upper lobe mass, unchanged. Increased lucency in the left lung compared to the right is stable. Increased haziness on the right is stable compared to the left. No change in the cardiomediastinal silhouette. IMPRESSION: No interval change in bilateral pleural effusions, a left upper lobe mass, the right Port-A-Cath, or haziness in the right chest. Electronically Signed   By: Dorise Bullion III M.D   On: 07/04/2017 22:33    Scheduled Meds: . apixaban  10 mg Oral BID  . [START ON 07/08/2017] apixaban  5 mg Oral BID  . escitalopram  5 mg Oral QHS  . feeding supplement (ENSURE ENLIVE)  237 mL Oral TID BM  . fluticasone furoate-vilanterol  1 puff Inhalation Daily   And  . umeclidinium bromide  1 puff Inhalation Daily  . furosemide  20 mg Oral Daily  .  guaiFENesin  1,200 mg Oral BID  . ipratropium-albuterol  3 mL Inhalation Q6H  . loratadine  10 mg Oral Daily  . magnesium hydroxide  30 mL Oral Daily  . metoprolol tartrate  12.5 mg Oral BID  . pantoprazole  40 mg Oral BID    Continuous Infusions:   Time spent: 35 mins I have personally reviewed and interpreted on  07/05/2017 daily labs, tele strips, imagings as discussed above under date review session and assessment and plans.  I reviewed all nursing notes, pharmacy notes, consultant notes,  vitals, pertinent old records  I have discussed plan of care as described above with RN , patient  on 07/05/2017   Florencia Reasons MD, PhD  [facility] Pager [phone]. If 7PM-7AM, please contact night-coverage at [email or web address], password [facility] 07/05/2017, 10:29 AM  LOS: 1 day

## 2017-07-05 NOTE — Progress Notes (Signed)
Pt prepared for US thoracentesis. SRP, RN

## 2017-07-05 NOTE — Procedures (Signed)
PROCEDURE SUMMARY:  Successful image-guided right thoracentesis. Yielded 1.2 liters of blood tinged fluid. Patient tolerated procedure well. No immediate complications.  Specimen was sent for labs. CXR ordered.  Claris Pong Louk PA-C 07/05/2017 1:41 PM

## 2017-07-06 DIAGNOSIS — Z7189 Other specified counseling: Secondary | ICD-10-CM

## 2017-07-06 DIAGNOSIS — Z515 Encounter for palliative care: Secondary | ICD-10-CM

## 2017-07-06 LAB — BASIC METABOLIC PANEL
ANION GAP: 6 (ref 5–15)
BUN: 21 mg/dL — AB (ref 6–20)
CALCIUM: 8.8 mg/dL — AB (ref 8.9–10.3)
CO2: 37 mmol/L — ABNORMAL HIGH (ref 22–32)
Chloride: 103 mmol/L (ref 101–111)
Creatinine, Ser: 1.73 mg/dL — ABNORMAL HIGH (ref 0.61–1.24)
GFR calc Af Amer: 49 mL/min — ABNORMAL LOW (ref >60)
GFR, EST NON AFRICAN AMERICAN: 42 mL/min — AB (ref >60)
GLUCOSE: 96 mg/dL (ref 65–99)
Potassium: 4.4 mmol/L (ref 3.5–5.1)
SODIUM: 146 mmol/L — AB (ref 135–145)

## 2017-07-06 LAB — CBC WITH DIFFERENTIAL/PLATELET
BASOS ABS: 0 10*3/uL (ref 0.0–0.1)
Band Neutrophils: 2 %
Basophils Relative: 0 %
EOS PCT: 0 %
Eosinophils Absolute: 0 10*3/uL (ref 0.0–0.7)
HCT: 23.1 % — ABNORMAL LOW (ref 39.0–52.0)
Hemoglobin: 7.5 g/dL — ABNORMAL LOW (ref 13.0–17.0)
LYMPHS ABS: 0.5 10*3/uL — AB (ref 0.7–4.0)
Lymphocytes Relative: 5 %
MCH: 33.6 pg (ref 26.0–34.0)
MCHC: 32.5 g/dL (ref 30.0–36.0)
MCV: 103.6 fL — ABNORMAL HIGH (ref 78.0–100.0)
MONO ABS: 0.2 10*3/uL (ref 0.1–1.0)
MONOS PCT: 2 %
Metamyelocytes Relative: 3 %
Myelocytes: 1 %
NEUTROS PCT: 87 %
Neutro Abs: 9.6 10*3/uL — ABNORMAL HIGH (ref 1.7–7.7)
PLATELETS: 168 10*3/uL (ref 150–400)
RBC: 2.23 MIL/uL — AB (ref 4.22–5.81)
RDW: 17.8 % — AB (ref 11.5–15.5)
WBC: 10.3 10*3/uL (ref 4.0–10.5)

## 2017-07-06 MED ORDER — SODIUM CHLORIDE 0.9% FLUSH
10.0000 mL | Freq: Two times a day (BID) | INTRAVENOUS | Status: DC
  Administered 2017-07-06 – 2017-07-08 (×4): 10 mL

## 2017-07-06 NOTE — Progress Notes (Signed)
SATURATION QUALIFICATIONS: (This note is used to comply with regulatory documentation for home oxygen)  Patient Saturations on Room Air at Rest = 91%  Patient Saturations on Room Air while Ambulating = 85%  Patient Saturations on 2 Liters of oxygen while Ambulating = 94%  Please briefly explain why patient needs home oxygen:

## 2017-07-06 NOTE — Progress Notes (Addendum)
PROGRESS NOTE  ANITA MCADORY ZOX:[id number] DOB: 1960/06/01 DOA: 07/04/2017 PCP: [name], MD  HPI/Recap of past 24 hours:  Had 1.2liter pleural fluids removed, he reports did not feel any difference He is continue to refusing meds or nebs, He refused to ambulate to check 02 sats   Assessment/Plan: Principal Problem:   Acute on chronic respiratory failure with hypoxia ([place]) Active Problems:   Adenocarcinoma of left lung, stage 4 (HCC)   COPD (chronic obstructive pulmonary disease) (HCC)   HTN (hypertension)   CKD (chronic kidney disease), stage III (HCC)   Chronic combined systolic (congestive) and diastolic (congestive) heart failure (HCC)   PE (pulmonary thromboembolism) (HCC)  Acute on chronic hypoxic respiratory failure -likely multifactorial, he has copd, stage IV lung cancer, recently diagnosed PE vs tumor emboli , per cta on 6/10 (acute pulmonary thrombus at the left main pulmonary artery bifurcation and extending into the left upper lobe. 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 b12/folate/tsh unremarkable, he is followed by hematology/oncology - Hgb stable  Essential hypertension -he is on lopressor  H/oerosive gastropathy/gastritis  Recent EGD by [facility] Dr Fuller Plan on 11/2016 No acute issues,Continue ppi bid.   [id number] is his 9th hospitalization this year palliative care following From ALF    Over all poor prognosis, I explained to him that his stage IV cancer is not curable, he is getting palliative chemo as long as his is able to tolerate it I explained to him that his life expectancy is limited, he need to continue to discuss with palliative care and consider hospice and focus care on comfort, he expressed understanding, he said he need to let his family know about this, palliative care consulted  Code Status: DNR  Family Communication: patient   Disposition Plan: return to [place] pending respiratory status improvement   Consultants:  Palliative care  IR  Procedures:  US guided thoracentesis on the right  Antibiotics:  none   Objective: BP 106/64 (BP Location: Right Arm)   Pulse (!) 59   Temp 98.7 F (37.1 C) (Oral)   Resp 18   Ht 5\' 11"  (1.803 m)   Wt 52.6 kg (116 lb)   SpO2 95%   BMI 16.18 kg/m   Intake/Output Summary (Last 24 hours) at 07/06/2017 1221 Last data filed at 07/06/2017 1004 Gross per 24 hour  Intake -  Output 2300 ml  Net -2300 ml   Filed Weights   07/05/17 1338  Weight: 52.6 kg (116 lb)    Exam: Patient is examined daily including today on 07/06/2017, exams remain the same as of yesterday except that has changed    General:  Chronically ill appearing, NAD  Cardiovascular: RRR  Respiratory: improved aeration on the right,  no wheezing, no rales, no rhonchi  Abdomen: Soft/ND/NT, positive BS  Musculoskeletal: No Edema  Neuro: alert, oriented   Data Reviewed: Basic Metabolic Panel: Recent Labs  Lab 06/30/17 0329 07/01/17 0052 07/03/17 0831 07/04/17 0838 07/06/17 0333  NA 142 142 141 140 146*  K 4.1 3.8 3.2* 3.5 4.4  CL 101 100* 98* 98* 103  CO2 33* 34* 34* 33* 37*  GLUCOSE 143* 130* 90 87 96  BUN 29* 30* 18 23* 21*  CREATININE 1.40* 1.49* 1.37* 1.56* 1.73*  CALCIUM 9.4 9.1 8.6* 9.0 8.8*   Liver Function Tests: Recent Labs  Lab 07/03/17 0831  AST 25  ALT 20  ALKPHOS 87  BILITOT 0.6  PROT 5.6*  ALBUMIN 2.8*   No results for input(s): LIPASE, AMYLASE in the last 168 hours. No results for input(s): AMMONIA in the last 168 hours. CBC: Recent Labs  Lab 06/30/17 0329 07/01/17 0052 07/01/17 0825 07/03/17 0831 07/04/17 0838 07/06/17 0333  WBC 18.1* 15.2*  --  12.8* 14.7* 10.3  NEUTROABS 15.9*  --   --  9.6* 11.3 9.6*  HGB 9.1* 8.3*  --  8.2* 7.9* 7.5*  HCT 28.3* 25.9* 25.8* 25.2* 24.7* 23.1*  MCV 102.2* 100.8*  --  103.7* 104.2* 103.6*  PLT 311 248  --  201 194 168   Cardiac Enzymes:   Recent Labs  Lab 06/30/17 1258 06/30/17 1823 07/01/17 0052 07/04/17 0838  TROPONINI 0.23* 0.28* 0.18* 0.08*   BNP (last 3 results) Recent Labs    06/25/17 0750 06/30/17 0812 07/04/17 2146  BNP 89.9 224.5* 168.6*    ProBNP (last 3 results) No results for input(s): PROBNP in the last 8760 hours.  CBG: No results for input(s): GLUCAP in the last 168 hours.  Recent Results (from the past 240 hour(s))  MRSA PCR Screening     Status: None   Collection Time: 06/30/17  7:24 PM  Result Value Ref Range Status   MRSA by PCR NEGATIVE NEGATIVE Final    Comment:        The GeneXpert MRSA Assay (FDA approved for NASAL specimens only), is one component of a comprehensive MRSA colonization surveillance program. It is not intended to diagnose MRSA infection nor to guide or monitor treatment  for MRSA infections. Performed at [facility], [place] [address], [place], [place] [address]      Studies: Dg Chest 1 View  Result Date: 07/05/2017 CLINICAL DATA:  S/p right sided thoracentesis today, 1.2 liters removed. EXAM: CHEST  1 VIEW COMPARISON:  Chest x-ray dated 07/04/2017. FINDINGS: Improved aeration at the RIGHT lung base, significantly decreased amount of pleural effusion compared to the earlier CT of 06/30/2017. Stable small LEFT pleural effusion and/or atelectasis. No pneumothorax seen. Heart size and mediastinal contours are stable. RIGHT chest wall Port-A-Cath is stable in position with tip overlying the RIGHT atrium. No acute or suspicious osseous finding. IMPRESSION: Status post thoracentesis.  No pneumothorax. Electronically Signed   By: Franki Cabot M.D.   On: 07/05/2017 14:06   US Thoracentesis Asp Pleural Space W/img Guide  Result Date: 07/05/2017 INDICATION: Patient with history of metastatic lung cancer, dyspnea, and  recurrent right-sided pleural effusion. Request is made for diagnostic and therapeutic right thoracentesis. EXAM: ULTRASOUND GUIDED DIAGNOSTIC AND THERAPEUTIC RIGHT THORACENTESIS MEDICATIONS: 10 mL of 1% lidocaine COMPLICATIONS: None immediate. PROCEDURE: An ultrasound guided thoracentesis was thoroughly discussed with the patient and questions answered. The benefits, risks, alternatives and complications were also discussed. The patient understands and wishes to proceed with the procedure. Written consent was obtained. Ultrasound was performed to localize and mark an adequate pocket of fluid in the right chest. The area was then prepped and draped in the normal sterile fashion. 1% Lidocaine was used for local anesthesia. Under ultrasound guidance a 6 Fr Safe-T-Centesis catheter was introduced. Thoracentesis was performed. The catheter was removed and a dressing applied. FINDINGS: A total of approximately 1.2 L of blood-tinged fluid was removed.  Samples were sent to the laboratory as requested by the clinical team. IMPRESSION: Successful ultrasound guided right thoracentesis yielding 1.2 L of pleural fluid. Read by: Earley Abide, PA-C No pneumothorax on follow-up radiograph. Electronically Signed   By: Lucrezia Europe M.D.   On: 07/05/2017 14:12    Scheduled Meds: . apixaban  10 mg Oral BID  . [START ON 07/08/2017] apixaban  5 mg Oral BID  . escitalopram  5 mg Oral QHS  . feeding supplement (ENSURE ENLIVE)  237 mL Oral TID BM  . fluticasone furoate-vilanterol  1 puff Inhalation Daily   And  . umeclidinium bromide  1 puff Inhalation Daily  . guaiFENesin  1,200 mg Oral BID  . ipratropium-albuterol  3 mL Inhalation Q6H  . loratadine  10 mg Oral Daily  . metoprolol tartrate  12.5 mg Oral BID  . pantoprazole  40 mg Oral BID  . senna-docusate  1 tablet Oral BID  . sodium chloride flush  10-40 mL Intracatheter Q12H    Continuous Infusions:   Time spent: 25 mins I have personally reviewed and interpreted on  07/06/2017 daily labs, tele strips, imagings as discussed above under date review session and assessment and plans.  I reviewed all nursing notes,  vitals, pertinent old records  I have discussed plan of care as described above with RN , patient  on 07/06/2017   Florencia Reasons MD, PhD  [facility] Pager [phone]. If 7PM-7AM, please contact night-coverage at [email or web address], password [facility] 07/06/2017, 12:21 PM  LOS: 2 days

## 2017-07-06 NOTE — Consult Note (Signed)
Consultation Note Date: 07/06/2017   Patient Name: [name]  DOB: 08-18-1960  MRN: [id number]  Age / Sex: 57 y.o., male  PCP: [name], MD Referring Physician: Florencia Reasons, MD  Reason for Consultation: Establishing goals of care and Hospice Evaluation  HPI/Patient Profile: 57 y.o. male  with past medical history of stage IV lung cancer, COPD, HTN, CHF, numerous admissions (10 admissions and 8 ED visits in past 6 months), recent diagnosis of PE (on Eliquis) admitted on 07/04/2017 with worsening dyspnea and 1.2 L right thoracentesis performed 07/05/17.   Clinical Assessment and Goals of Care: I met again today with Mr. Mwangi. I met with Mr. Pechacek just this past admission and last time on 07/03/17. Mr. Hannen is from ALF. He does not appear his normal joyful self when I first walk in today. He does perk up a little during our conversation but complains of pain s/p thoracentesis today. Main complaint past admission was abd pain which he says is improved after BM. Unfortunately he was not discharged even 24 hrs before return to the hospital. He says he became very SOB and when the ambulance came they asked him what he wanted to do and he told them "of course I want to come to the hospital." When we talk further he admits that he feels safer when he is at the hospital and knows that the nurses and doctors are looking out for him. He does not have the assistance he needs at ALF. He does not really understand how to take prn medication for SOB.   We spoke further about hospice services. We discussed the goal of hospice is to add support and assist with symptom management such as SOB. He is very hesitant for hospice but says that he would have to talk with his mother. I question if even hospice services will be enough support for Mr. Kirk to not continue returning to the hospital. I believe that he needs a higher  level of care to assist with his medications and symptom management. He would be willing to go to higher level of care if arranged for him.   Mr. Geathers has good understanding that his cancer is not curable and that he will die from this cancer. However, he is willing to continue most therapies to prolong his life: "if I get another 5 or 6 months then that's something." I also do not believe he understands that his symptoms are correlated with his terminal illness and that this is only going to worsen. He struggles with the details of being at end of life. I do not feel he is able to stay out of the hospital long enough to f/u with [name] for further treatment anyway. I will follow up tomorrow and continue discussions.   Primary Decision Maker PATIENT    SUMMARY OF RECOMMENDATIONS   - Considering hospice - I will place CSW consult as he needs higher level of care with more support.  - Consider PleurX but this will not be  able to be managed at his current level of care  Code Status/Advance Care Planning:  DNR   Symptom Management:   SOB/pain: Continue OxyIR 10 mg every 4 hours prn.   Constipation: Continue Senokot BID. Miralax daily prn mild constipation. Improved.   Palliative Prophylaxis:   Bowel Regimen, Delirium Protocol and Frequent Pain Assessment  Additional Recommendations (Limitations, Scope, Preferences):  Full Scope Treatment  Psycho-social/Spiritual:   Desire for further Chaplaincy support:yes  Additional Recommendations: Education on Hospice  Prognosis:   < 6 months  Discharge Planning: To Be Determined      Primary Diagnoses: Present on Admission: . Adenocarcinoma of left lung, stage 4 ([place]) . Chronic combined systolic (congestive) and diastolic (congestive) heart failure ([place]) . COPD (chronic obstructive pulmonary disease) ([place]) . Acute on chronic respiratory failure with hypoxia ([place]) . PE (pulmonary thromboembolism) ([place]) . HTN  (hypertension) . CKD (chronic kidney disease), stage III ([place])   I have reviewed the medical record, interviewed the patient and family, and examined the patient. The following aspects are pertinent.  Past Medical History:  Diagnosis Date  . Abdominal pain 06/04/2016  . Adenocarcinoma of left lung, stage 4 ([place]) 05/02/2016  . Alcohol abuse   . Bronchitis due to tobacco use ([place])   . Cancer ([place])    Lung  . COPD (chronic obstructive pulmonary disease) ([place])   . Dehydration 06/04/2016  . Encounter for antineoplastic chemotherapy 05/02/2016  . Gastritis   . Goals of care, counseling/discussion 05/02/2016  . Hematemesis   . HTN (hypertension) 10/30/2016  . Recurrent upper respiratory infection (URI)   . Seizures ([place]) 05/2011   new onset  . Shortness of breath    Social History   Socioeconomic History  . Marital status: Single    Spouse name: Not on file  . Number of children: Not on file  . Years of education: Not on file  . Highest education level: Not on file  Occupational History  . Not on file  Social Needs  . Financial resource strain: Not on file  . Food insecurity:    Worry: Not on file    Inability: Not on file  . Transportation needs:    Medical: Not on file    Non-medical: Not on file  Tobacco Use  . Smoking status: Current Some Day Smoker    Packs/day: 0.10    Years: 30.00    Pack years: 3.00    Types: Cigarettes  . Smokeless tobacco: Never Used  . Tobacco comment: 1-2 cig a day   Substance and Sexual Activity  . Alcohol use: No    Frequency: Never  . Drug use: No  . Sexual activity: Yes  Lifestyle  . Physical activity:    Days per week: Not on file    Minutes per session: Not on file  . Stress: Not on file  Relationships  . Social connections:    Talks on phone: Not on file    Gets together: Not on file    Attends religious service: Not on file    Active member of club or organization: Not on file    Attends meetings of clubs or organizations:  Not on file    Relationship status: Not on file  Other Topics Concern  . Not on file  Social History Narrative  . Not on file   Family History  Problem Relation Age of Onset  . Cancer Father   . Diabetes Mellitus II Mother    Scheduled Meds: . apixaban  10 mg Oral BID  . [START ON 07/08/2017] apixaban  5 mg Oral BID  . escitalopram  5 mg Oral QHS  . feeding supplement (ENSURE ENLIVE)  237 mL Oral TID BM  . fluticasone furoate-vilanterol  1 puff Inhalation Daily   And  . umeclidinium bromide  1 puff Inhalation Daily  . guaiFENesin  1,200 mg Oral BID  . ipratropium-albuterol  3 mL Inhalation Q6H  . loratadine  10 mg Oral Daily  . metoprolol tartrate  12.5 mg Oral BID  . pantoprazole  40 mg Oral BID  . senna-docusate  1 tablet Oral BID  . sodium chloride flush  10-40 mL Intracatheter Q12H   Continuous Infusions: PRN Meds:.acetaminophen **OR** acetaminophen, albuterol, ondansetron **OR** ondansetron (ZOFRAN) IV, oxyCODONE No Known Allergies Review of Systems  Constitutional: Positive for activity change and fatigue. Negative for appetite change.  Respiratory: Positive for shortness of breath.   Gastrointestinal: Negative for constipation.    Physical Exam  Constitutional: He is oriented to person, place, and time. He appears well-developed. He appears cachectic. He has a sickly appearance.  HENT:  Head: Normocephalic and atraumatic.  Cardiovascular: Normal rate.  Pulmonary/Chest: Effort normal. No accessory muscle usage. No tachypnea. No respiratory distress.  Abdominal: Normal appearance.  Softer than previous admission.   Neurological: He is alert and oriented to person, place, and time.  Nursing note and vitals reviewed.   Vital Signs: BP 114/63 (BP Location: Right Arm)   Pulse 87   Temp 98.6 F (37 C) (Oral)   Resp (!) 22   Ht 5' 11"  (1.803 m)   Wt 52.6 kg (116 lb)   SpO2 (!) 85% Comment: While ambulating in hallway  BMI 16.18 kg/m  Pain Scale: 0-10   Pain  Score: 8    SpO2: SpO2: (!) 85 %(While ambulating in hallway) O2 Device:SpO2: (!) 85 %(While ambulating in hallway) O2 Flow Rate: .O2 Flow Rate (L/min): 2 L/min  IO: Intake/output summary:   Intake/Output Summary (Last 24 hours) at 07/06/2017 1658 Last data filed at 07/06/2017 1428 Gross per 24 hour  Intake -  Output 2050 ml  Net -2050 ml    LBM: Last BM Date: 07/06/17 Baseline Weight: Weight: 52.6 kg (116 lb) Most recent weight: Weight: 52.6 kg (116 lb)     Palliative Assessment/Data: 40%     Time Total: 50 min  Greater than 50%  of this time was spent counseling and coordinating care related to the above assessment and plan.  Signed by: Vinie Sill, NP Palliative Medicine Team Pager # [phone] (M-F 8a-5p) Team Phone # [phone] (Nights/Weekends)

## 2017-07-06 NOTE — Progress Notes (Signed)
Pt. Found without oxygen in place.  He stated that he had it off for approximately 10- 15 minutes.  Will replace o2 when HHN treatment completed.

## 2017-07-07 LAB — CBC WITH DIFFERENTIAL/PLATELET
BASOS PCT: 0 %
Basophils Absolute: 0 10*3/uL (ref 0.0–0.1)
EOS PCT: 0 %
Eosinophils Absolute: 0 10*3/uL (ref 0.0–0.7)
HEMATOCRIT: 23.7 % — AB (ref 39.0–52.0)
Hemoglobin: 7.4 g/dL — ABNORMAL LOW (ref 13.0–17.0)
LYMPHS PCT: 13 %
Lymphs Abs: 1.6 10*3/uL (ref 0.7–4.0)
MCH: 33 pg (ref 26.0–34.0)
MCHC: 31.2 g/dL (ref 30.0–36.0)
MCV: 105.8 fL — AB (ref 78.0–100.0)
MONO ABS: 1.3 10*3/uL — AB (ref 0.1–1.0)
Monocytes Relative: 11 %
NEUTROS PCT: 76 %
Neutro Abs: 9.3 10*3/uL — ABNORMAL HIGH (ref 1.7–7.7)
PLATELETS: 157 10*3/uL (ref 150–400)
RBC: 2.24 MIL/uL — AB (ref 4.22–5.81)
RDW: 17.7 % — AB (ref 11.5–15.5)
WBC: 12.2 10*3/uL — AB (ref 4.0–10.5)

## 2017-07-07 LAB — BASIC METABOLIC PANEL
ANION GAP: 7 (ref 5–15)
BUN: 15 mg/dL (ref 6–20)
CALCIUM: 8.5 mg/dL — AB (ref 8.9–10.3)
CO2: 35 mmol/L — AB (ref 22–32)
Chloride: 103 mmol/L (ref 101–111)
Creatinine, Ser: 1.37 mg/dL — ABNORMAL HIGH (ref 0.61–1.24)
GFR, EST NON AFRICAN AMERICAN: 56 mL/min — AB (ref >60)
Glucose, Bld: 96 mg/dL (ref 65–99)
Potassium: 3.5 mmol/L (ref 3.5–5.1)
Sodium: 145 mmol/L (ref 135–145)

## 2017-07-07 LAB — PATHOLOGIST SMEAR REVIEW: Path Review: UNDETERMINED

## 2017-07-07 MED ORDER — IPRATROPIUM-ALBUTEROL 0.5-2.5 (3) MG/3ML IN SOLN: 3.0000 mL | Freq: Four times a day (QID) | RESPIRATORY_TRACT | Status: DC | PRN

## 2017-07-07 MED ORDER — POLYETHYLENE GLYCOL 3350 17 G PO PACK: 17.0000 g | PACK | Freq: Every day | ORAL | Status: DC | PRN

## 2017-07-07 MED ORDER — GABAPENTIN 100 MG PO CAPS
100.0000 mg | ORAL_CAPSULE | Freq: Once | ORAL | Status: AC
  Administered 2017-07-07: 100 mg via ORAL
  Filled 2017-07-07: qty 1

## 2017-07-07 MED ORDER — HEPARIN SOD (PORK) LOCK FLUSH 100 UNIT/ML IV SOLN
500.0000 [IU] | INTRAVENOUS | Status: AC | PRN
  Administered 2017-07-07: 500 [IU]

## 2017-07-07 MED ORDER — IPRATROPIUM-ALBUTEROL 0.5-2.5 (3) MG/3ML IN SOLN: 3.0000 mL | Freq: Three times a day (TID) | RESPIRATORY_TRACT | Status: DC

## 2017-07-07 MED ORDER — POTASSIUM CHLORIDE CRYS ER 20 MEQ PO TBCR
40.0000 meq | EXTENDED_RELEASE_TABLET | Freq: Once | ORAL | Status: DC
  Filled 2017-07-07: qty 2

## 2017-07-07 MED ORDER — ACETAMINOPHEN 500 MG PO TABS: 500.0000 mg | ORAL_TABLET | Freq: Two times a day (BID) | ORAL | 0 refills | Status: DC | PRN

## 2017-07-07 NOTE — Progress Notes (Signed)
PROGRESS NOTE  [name] CNO:[id number] DOB: 04/08/1960 DOA: 07/04/2017 PCP: [name], MD  HPI/Recap of past 24 hours:   He reports did not feel better or worse, he c/o not able to do much for a while due to DOE Now he is open to snf placement  After discuss with his mother  He  continues to refusing meds or nebs, He walked on 2liter o2sats remain above 94%   Assessment/Plan: Principal Problem:   Acute on chronic respiratory failure with hypoxia ([place]) Active Problems:   Adenocarcinoma of left lung, stage 4 ([facility])   COPD (chronic obstructive pulmonary disease) ([place])   HTN (hypertension)   CKD (chronic kidney disease), stage III ([place])   Chronic combined systolic (congestive) and diastolic (congestive) heart failure ([place])   PE (pulmonary thromboembolism) ([place])  Acute on chronic hypoxic respiratory failure -likely multifactorial, he has copd, stage IV lung cancer, recently diagnosed PE vs tumor emboli , per cta on 6/10 (acute pulmonary thrombus at the left main pulmonary artery bifurcation and extending into the left upper lobe. Much of the visible thrombus is nonocclusive, and it might be tumor related, with adjacent peribronchial opacity tracking from the left upper lobe carcinoma to the hilum.) -he does has persistent right sided pleural effusion, last thoracentesis was in May, underwent thoracentesis on 6/15 with 1.2liter removed, ambulated on 2liter post thoracentesis, 02 remained above 94%, though he continue to c/o not feeling better -case discussed with oncology Dr [name] who recommended thoracic surgery consult for possible pleurx placement, thoracic surgery will see patient in clinic on 6/24 for eval.  Dr [name] prefers thoracic surgery to place pleurx if needed.   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 b12/folate/tsh unremarkable, he is followed by hematology/oncology - Hgb stable  Essential hypertension -he is on lopressor  H/oerosive gastropathy/gastritis  Recent EGD by [facility] Dr Fuller Plan on 11/2016 No acute issues,Continue ppi bid.   QHU:TMLY is his 9th hospitalization this year palliative care following From ALF    Over all poor prognosis, I explained to him that his stage IV cancer is not curable, he is getting palliative chemo as long as his is able to tolerate it I explained to him that his life expectancy is limited, he need to continue to discuss with palliative care and consider hospice and focus care on comfort, he expressed understanding, he said he need to let his family know about this, palliative care consulted  Code Status: DNR  Family Communication: patient   Disposition Plan: return to alf vs discharge to snf ( patient has a lot of  symptom management needs, will benefit from snf level care)   Consultants:  Palliative care  IR  Phone conversation with oncology Dr [name]  Phone conversation with thoracic surgery  Procedures:  US guided thoracentesis on the right on  6/15  Antibiotics:  none   Objective: BP 119/71 (BP Location: Left Arm)   Pulse 77   Temp 98.7 F (37.1 C) (Oral)   Resp 17   Ht 5\' 11"  (1.803 m)   Wt 52.6 kg (116 lb)   SpO2 100%   BMI 16.18 kg/m   Intake/Output Summary (Last 24 hours) at 07/07/2017 1416 Last data filed at 07/07/2017 1326 Gross per 24 hour  Intake 490 ml  Output 1900 ml  Net -1410 ml   Filed Weights   07/05/17 1338  Weight: 52.6 kg (116 lb)    Exam: Patient is examined daily including today on 07/07/2017, exams remain the same as of yesterday except that has changed    General:  Chronically ill appearing, NAD  Cardiovascular: RRR  Respiratory: improved aeration on the right, no wheezing, no rales, no rhonchi  Abdomen: Soft/ND/NT, positive BS  Musculoskeletal: No Edema  Neuro: alert, oriented   Data Reviewed: Basic Metabolic Panel: Recent Labs  Lab 07/01/17 0052 07/03/17 0831 07/04/17 0838 07/06/17 0333 07/07/17 0403  NA 142 141 140 146* 145  K 3.8 3.2* 3.5 4.4 3.5  CL 100* 98* 98* 103 103  CO2 34* 34* 33* 37* 35*  GLUCOSE 130* 90 87 96 96  BUN 30* 18 23* 21* 15  CREATININE 1.49* 1.37* 1.56* 1.73* 1.37*  CALCIUM 9.1 8.6* 9.0 8.8* 8.5*   Liver Function Tests: Recent Labs  Lab 07/03/17 0831  AST 25  ALT 20  ALKPHOS 87  BILITOT 0.6  PROT 5.6*  ALBUMIN 2.8*   No results for input(s): LIPASE, AMYLASE in the last 168 hours. No results for input(s): AMMONIA in the last 168 hours. CBC: Recent Labs  Lab 07/01/17 0052 07/01/17 0825 07/03/17 0831 07/04/17 0838 07/06/17 0333 07/07/17 0403  WBC 15.2*  --  12.8* 14.7* 10.3 12.2*  NEUTROABS  --   --  9.6* 11.3 9.6* 9.3*  HGB 8.3*  --  8.2* 7.9* 7.5* 7.4*  HCT 25.9* 25.8* 25.2* 24.7* 23.1* 23.7*  MCV 100.8*  --  103.7* 104.2* 103.6* 105.8*  PLT 248  --  201 194 168 157   Cardiac Enzymes:   Recent Labs  Lab 06/30/17 1823 07/01/17 0052 07/04/17 0838  TROPONINI 0.28* 0.18* 0.08*   BNP (last 3 results) Recent Labs     06/25/17 0750 06/30/17 0812 07/04/17 2146  BNP 89.9 224.5* 168.6*    ProBNP (last 3 results) No results for input(s): PROBNP in the last 8760 hours.  CBG: No results for input(s): GLUCAP in the last 168 hours.  Recent Results (from the past 240 hour(s))  MRSA PCR Screening     Status: None   Collection Time: 06/30/17  7:24 PM  Result Value Ref Range Status   MRSA by PCR NEGATIVE NEGATIVE Final    Comment:        The GeneXpert MRSA Assay (FDA approved for NASAL specimens only), is one component of a comprehensive MRSA colonization surveillance program. It is not intended to diagnose MRSA infection nor to guide or monitor treatment for MRSA infections. Performed at [facility], [place] [address], [place], [place] [address]      Studies: No results found.  Scheduled Meds: . apixaban  10 mg Oral BID  . [  START ON 07/08/2017] apixaban  5 mg Oral BID  . escitalopram  5 mg Oral QHS  . feeding supplement (ENSURE ENLIVE)  237 mL Oral TID BM  . fluticasone furoate-vilanterol  1 puff Inhalation Daily   And  . umeclidinium bromide  1 puff Inhalation Daily  . gabapentin  100 mg Oral Once  . guaiFENesin  1,200 mg Oral BID  . loratadine  10 mg Oral Daily  . metoprolol tartrate  12.5 mg Oral BID  . pantoprazole  40 mg Oral BID  . potassium chloride  40 mEq Oral Once  . senna-docusate  1 tablet Oral BID  . sodium chloride flush  10-40 mL Intracatheter Q12H    Continuous Infusions:   Time spent: 35 mins, more than 50% time spent on coordination of care, case discussed with palliative care/oncology/thoracic surgery I have personally reviewed and interpreted on  07/07/2017 daily labs, tele strips, imagings as discussed above under date review session and assessment and plans.  I reviewed all nursing notes,  vitals, pertinent old records  I have discussed plan of care as described above with RN , patient  on 07/07/2017   Florencia Reasons MD, PhD  [facility] Pager [phone]. If 7PM-7AM, please contact night-coverage at [email or web address], password [facility] 07/07/2017, 2:16 PM  LOS: 3 days

## 2017-07-07 NOTE — Progress Notes (Signed)
Palliative:  [name] is frustrated and agitated when I come to visit with CSW. He now feels like he does not want to leave his ALF. I think he is anxious and angry that discharge from hospital is impending. He did calm during our discussion quickly. He complains of chest pain where he had thoracentesis and is unable to further describe pain - will try a dose of gabapentin to see if this provides some relief.   He understands that he will die from this cancer and is facing the end of his life. He does not understand our limitations in making him better and is resistant to medication changes and interventions. We were joined by Dr. Erlinda Hong and further discuss plans moving forward. He again changes his mind and is interested in SNF. I fear he has very poor understanding as he at times agrees to SNF placement and also to hospice but this often changes.   I offered to call his mother and he very much is happy to have [place] speak with his mother. He says that he values her opinion and input. I called and spoke with mother who understands her son's poor prognosis. She says that she tells him to do what the doctors and nurses tell him and follow their advice. We discussed [name] frustrations with symptoms. I explained that we recommend that he needs more assistance and care with his worsening symptom burden and even the possibility of hospice. She completely understands and agrees with our recommendations. She is saddened but just wants her son to be taken care of. Emotional support provided.   I updated [name] that his mother agrees with pursuing SNF placement and if SNF not an option then we will pursue return to ALF with hospice care. He thanked me for speaking with her and agrees with what his mother believes is best for him. Emotional support provided.   I recommend that we continue to involve mother in care plan as she was very understanding and helpful. He desires to have her kept up to date and  involved. I have updated her phone contact in facesheet/demographics.   Plan: - Trial gabapentin 100 mg for chest pain s/p thoracentesis?? - CSW to pursue SNF placement (ALF with hospice if SNF not an option - Continue to discuss with patient AND mother  106 min  Johnathan Sill, NP Palliative Medicine Team Pager # [phone] (M-F 8a-5p) Team Phone # [phone] (Nights/Weekends)

## 2017-07-07 NOTE — Clinical Social Work Note (Signed)
Patient recently assessed on 07/03/2017, no psychosocial changes reported see assessment below. Patient from [facility], CSW consulted for SNF placement. CSW spoke with patient at bedside along with Palliative NP [name] regarding patient's interest in dc to SNF for a higher level of care due to patient's frequent admissions. Patient reported that he is not interested in dc to SNF because he doesn't want to move to another place and feels that ultimately there is nothing that anyone can do for him. Palliative NP discussed quality of life and symptom management with patient and informed patient that a higher level care may be beneficial for patient and may prevent frequent hospitalizations. Patient reported that if he is sick he is coming to the hospital, CSW and Palliative NP validated patient's feelings. Patient declined SNF and verbalized plan to dc back to [name], patient reported that he is agreeable to Hospice following at ALF. CSW notified patient's RNCM about patient's interest in hospice. CSW will continue to follow and assist patient with discharge planning.    Clinical Social Work Assessment  Patient Details  Name: [name] MRN: [id number] Date of Birth: June 22, 1960  Date of referral:                  Reason for consult:                   Permission sought to share information with:    Permission granted to share information::     Name::        Agency::     Relationship::     Contact Information:     Housing/Transportation Living arrangements for the past 2 months:    Source of Information:    Patient Interpreter Needed:    Criminal Activity/Legal Involvement Pertinent to Current Situation/Hospitalization:    Significant Relationships:    Lives with:    Do you feel safe going back to the place where you live?    Need for family participation in patient care:     Care giving concerns:  Patient is a [name] term care resident at [facility]. Patient reported  that he is starting need assistance with bathing and dressing. Patient uses a walker at ALF. PT recommending home health PT.   Social Worker assessment / plan:  CSW spoke with patient at bedside regarding discharge planning. Patient reported that he plans to return to [name]F. Patient reported that he will need PTAR.  CSW spoke with [name] ALF staff member [name], who confirmed patient's ability to return. Staff reported that they use [facility] for palliative services and that they don't set it up for patient.   Palliative NP recommended that patient be followed by palliative care at ALF. RNCM notified to follow up with agency ALF uses for palliative care.   CSW will complete FL2 and send clinical information to SNF.  CSW will continue to follow and assist with discharge planning.  Employment status:    Insurance information:    PT Recommendations:    Information / Referral to community resources:     Patient/Family's Response to care:  Patient appreciative of CSW assistance with discharge planning.  Patient/Family's Understanding of and Emotional Response to Diagnosis, Current Treatment, and Prognosis:  Patient presented calm and remained engaged throughout the assessment. Patient and CSW discussed patient's prognosis, patient reported that his prognosis is poor. Patient was accepting of prognosis and reported that he is trying to adapt to his condition. Patient  reported that he is trying "keep hope alive" and requested prayer. CSW provided emotional support and active listening. Patient reported that he has support from his mom and sisters.   Emotional Assessment Appearance:    Attitude/Demeanor/Rapport:    Affect (typically observed):    Orientation:    Alcohol / Substance use:    Psych involvement (Current and /or in the community):     Discharge Needs  Concerns to be addressed:    Readmission within the last 30 days:    Current discharge risk:    Barriers to  Discharge:      Johnathan Medin, LCSW 07/07/2017, 1:55 PM

## 2017-07-08 MED ORDER — ALUM & MAG HYDROXIDE-SIMETH 200-200-20 MG/5ML PO SUSP: 30.0000 mL | Freq: Four times a day (QID) | ORAL | Status: DC | PRN

## 2017-07-08 NOTE — NC FL2 (Signed)
[place] LEVEL OF CARE SCREENING TOOL     IDENTIFICATION  Patient Name: [name] Birthdate: 1960-06-12 Sex: male Admission Date (Current Location): 07/04/2017  [facility] and [place] Number:  Herbalist and Address:  [facility],  [place] [address], [place]      Provider Number: [id number]  Attending Physician Name and Address:  Florencia Reasons, MD  Relative Name and Phone Number:       Current Level of Care: Hospital Recommended Level of Care: [place] Prior Approval Number:    Date Approved/Denied:   PASRR Number: [id number]  Discharge Plan: SNF    Current Diagnoses: Patient Active Problem List   Diagnosis Date Noted  . PE (pulmonary thromboembolism) ([place]) 06/30/2017  . Pulmonary embolism ([place]) 06/30/2017  . AKI (acute kidney injury) ([place]) 06/25/2017  . Dyspnea 06/21/2017  . Chronic combined systolic (congestive) and diastolic (congestive) heart failure ([place])   . Constipation 06/07/2017  . Pain   . Acute on chronic respiratory failure with hypoxia ([place]) 05/28/2017  . Chronic respiratory failure with hypoxia ([place]) 04/01/2017  . Leucocytosis 03/16/2017  . CKD (chronic kidney disease), stage III ([place]) 03/12/2017  . Malnutrition of moderate degree 03/10/2017  . COPD exacerbation ([place]) 03/09/2017  . Anemia 02/07/2017  . Severe malnutrition ([place]) 01/03/2017  . DOE (dyspnea on exertion) 12/30/2016  . Nausea   . Hypervolemia   . HCAP (healthcare-associated pneumonia) 12/19/2016  . SOB (shortness of breath) 12/19/2016  . Erosive gastropathy 12/18/2016  . Gastritis 12/18/2016  . Hematemesis 12/16/2016  . Intractable nausea and vomiting 12/16/2016  . HTN (hypertension) 10/30/2016  . Chronic obstructive pulmonary disease ([place]) 09/02/2016  . COPD (chronic obstructive pulmonary disease) ([place]) 08/19/2016  . Dehydration 06/04/2016  . Abdominal pain 06/04/2016  . 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  Adenocarcinoma of left lung, stage 4 ([place]) 05/02/2016  . Goals of care, counseling/discussion 05/02/2016  . Encounter for antineoplastic chemotherapy 05/02/2016  . Tobacco abuse 05/30/2011  . Alcohol abuse 05/30/2011  . Seizure ([place]) 05/28/2011    Orientation RESPIRATION BLADDER Height & Weight     Self, Time, Situation, Place  O2 Continent Weight: 116 lb (52.6 kg) Height:  5\' 11"  (180.3 cm)  BEHAVIORAL SYMPTOMS/MOOD NEUROLOGICAL BOWEL NUTRITION STATUS      Continent Diet(see dc summary)  AMBULATORY STATUS COMMUNICATION OF NEEDS Skin   Extensive Assist Verbally Normal                       Personal Care Assistance Level of Assistance  Bathing, Feeding, Dressing Bathing Assistance: Limited assistance Feeding assistance: Independent Dressing Assistance: Limited assistance     Functional Limitations Info  Sight, Hearing, Speech Sight Info: Adequate Hearing Info: Adequate Speech Info: Adequate    SPECIAL CARE FACTORS FREQUENCY                       Contractures Contractures Info: Not present    Additional Factors Info  Code Status, Allergies Code Status Info: DNR Allergies Info: NKA           Current Medications (07/08/2017):  This is the current hospital active medication list Current Facility-Administered Medications  Medication Dose Route Frequency Provider Last Rate Last Dose  . acetaminophen (TYLENOL) tablet 650 mg  650 mg Oral Q6H PRN Etta Quill, DO   650 mg at 07/05/17 0854   Or  . acetaminophen (TYLENOL) suppository 650 mg  650 mg Rectal  Q6H PRN Etta Quill, DO      . albuterol (PROVENTIL) (2.5 MG/3ML) 0.083% nebulizer solution 2.5 mg  2.5 mg Nebulization Q4H PRN Etta Quill, DO   2.5 mg at 07/05/17 0355  . apixaban (ELIQUIS) tablet 5 mg  5 mg Oral BID Jennette Kettle M, DO      . escitalopram (LEXAPRO) tablet 5 mg  5 mg Oral QHS Jennette Kettle M, DO   5 mg at 07/07/17 2149  . feeding supplement (ENSURE ENLIVE) (ENSURE ENLIVE)  liquid 237 mL  237 mL Oral TID BM Etta Quill, DO   237 mL at 07/08/17 1027  . fluticasone furoate-vilanterol (BREO ELLIPTA) 100-25 MCG/INH 1 puff  1 puff Inhalation Daily Jennette Kettle M, DO   1 puff at 07/08/17 0856   And  . umeclidinium bromide (INCRUSE ELLIPTA) 62.5 MCG/INH 1 puff  1 puff Inhalation Daily Etta Quill, DO   1 puff at 07/08/17 0856  . guaiFENesin (MUCINEX) 12 hr tablet 1,200 mg  1,200 mg Oral BID Jennette Kettle M, DO   1,200 mg at 07/08/17 1022  . ipratropium-albuterol (DUONEB) 0.5-2.5 (3) MG/3ML nebulizer solution 3 mL  3 mL Inhalation Q6H PRN Florencia Reasons, MD      . loratadine (CLARITIN) tablet 10 mg  10 mg Oral Daily Jennette Kettle M, DO   10 mg at 07/08/17 1022  . metoprolol tartrate (LOPRESSOR) tablet 12.5 mg  12.5 mg Oral BID Jennette Kettle M, DO   12.5 mg at 07/08/17 1022  . ondansetron (ZOFRAN) tablet 4 mg  4 mg Oral Q6H PRN Etta Quill, DO       Or  . ondansetron [facility]) injection 4 mg  4 mg Intravenous Q6H PRN Etta Quill, DO   4 mg at 07/05/17 0650  . oxyCODONE (Oxy IR/ROXICODONE) immediate release tablet 10 mg  10 mg Oral Q4H PRN Etta Quill, DO   10 mg at 07/08/17 0423  . pantoprazole (PROTONIX) EC tablet 40 mg  40 mg Oral BID Jennette Kettle M, DO   40 mg at 07/08/17 1022  . polyethylene glycol (MIRALAX / GLYCOLAX) packet 17 g  17 g Oral Daily PRN Vinie Sill C, NP      . potassium chloride SA (K-DUR,KLOR-CON) CR tablet 40 mEq  40 mEq Oral Once Florencia Reasons, MD      . senna-docusate (Senokot-S) tablet 1 tablet  1 tablet Oral BID Florencia Reasons, MD   1 tablet at 07/08/17 1022  . sodium chloride flush (NS) 0.9 % injection 10-40 mL  10-40 mL Intracatheter Q12H Florencia Reasons, MD   10 mL at 07/08/17 1027     Discharge Medications: Please see discharge summary for a list of discharge medications.  Relevant Imaging Results:  Relevant Lab Results:   Additional Information SSN [id number]  [name], LCSW

## 2017-07-08 NOTE — Progress Notes (Signed)
CSW spoke with patient/patient's family (mom,sister,friend) at bedside regarding patient's discharge planning. Patient reported that he is now agreement to go to SNF at discharge to get more assistance with his care needs. CSW explained SNF placement process and agreed to follow up with bed offers. Patient family requested that patient stay in [place] because they don't have transportation. CSW completed FL2 and will follow up with local SNFs to see if they are able to offer patient a bed.  Abundio Miu, [place] Social Worker [facility] Cell#: [phone]

## 2017-07-08 NOTE — Progress Notes (Signed)
[name]:[id number] DOB: 1960/03/05 DOA: 07/04/2017 PCP: [name], MD  HPI/Recap of past 24 hours:   He reports he feels better, Now he is open to snf placement and is willing to meet with hospice to what they can offer him    He  continues to refusing meds or nebs at time during hospitalization.  He walked on 2liter o2sats remain above 94%.  He does not appear in pain or distress at rest.   Assessment/Plan: Principal Problem:   Acute on chronic respiratory failure with hypoxia (HCC) Active Problems:   Adenocarcinoma of left lung, stage 4 (HCC)   COPD (chronic obstructive pulmonary disease) (HCC)   HTN (hypertension)   CKD (chronic kidney disease), stage III (HCC)   Chronic combined systolic (congestive) and diastolic (congestive) heart failure (HCC)   PE (pulmonary thromboembolism) (HCC)  Acute on chronic hypoxic respiratory failure -likely multifactorial, he has copd, stage IV lung cancer, recently diagnosed PE vs tumor emboli , per cta on 6/10 (acute pulmonary thrombus at the left main pulmonary artery bifurcation and extending into the left upper lobe. Much of the visible thrombus is nonocclusive, and it might be tumor related, with adjacent peribronchial opacity tracking from the left upper lobe carcinoma to the hilum.) -he does has persistent right sided pleural effusion, last thoracentesis was in May, underwent thoracentesis on 6/15 with 1.2liter removed, ambulated on 2liter post thoracentesis, 02 remained above 94%,  -case discussed with oncology Dr [name] who recommended thoracic surgery consult for possible pleurx placement, thoracic surgery will see patient in clinic on 6/24 for eval.  Dr [name] prefers thoracic surgery to place pleurx if needed.   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   Chronic Diastolic chf No edema,  Appear dry , lasix held on 6/17 and 6/18, likely able to d/c on prn lasix I have reviewed tele, has been stable, will d/c tele.  Hypokalemia: replace k He refused to take potassium supplement   CKD stage 3 - Baseline Cr in 12/2016 was 1.8 - Ctfluctuating but close to baseline, - avoid nephrotoxin, renal dosing meds -appear dry , hold lasix held on 6/17 and 6/18 -consider to add low dose spironolactone at discharge ( he also has small ascites )   Anemia of CKD, anemia in the setting of malignancy - Macrocytosis,. b12/folate/tsh unremarkable, he is followed by hematology/oncology - Hgb stable  Essential hypertension -he is on lopressor  H/oerosive gastropathy/gastritis  Recent EGD by [facility] Dr Fuller Plan on 11/2016 No acute issues,Continue ppi bid.   QIW:LNLG is his 9th hospitalization this year palliative care following From ALF    Over all poor prognosis, I explained to him that his stage IV cancer is not curable, he is getting palliative chemo as long as his is able to tolerate. I explained to him that his life expectancy is limited, He is now open to snf placement where he can get more care and hopefully to prevent frequent hospitalization He also agreed to talk to hospice to find out what they can  offer him  Palliative care continue to follow him , palliative care input greatly appreciated.   Code Status: DNR  Family Communication: patient   Disposition Plan:  discharge to snf ( patient has a lot of symptom management needs, will benefit from snf level  care)   Consultants:  Palliative care  IR  Phone conversation with oncology Dr [name]  Phone conversation with thoracic surgery  Procedures:  US guided thoracentesis on the right on 6/15  Antibiotics:  none   Objective: BP 115/69 (BP Location: Right Arm)   Pulse 75   Temp 97.8 F (36.6 C) (Oral)   Resp 18   Ht 5\' 11"  (1.803 m)   Wt 52.6 kg (116 lb)   SpO2 100%   BMI 16.18 kg/m   Intake/Output Summary (Last 24 hours) at 07/08/2017 1456 Last data filed at 07/08/2017 1200 Gross per 24 hour  Intake 460 ml  Output 2350 ml  Net -1890 ml   Filed Weights   07/05/17 1338  Weight: 52.6 kg (116 lb)    Exam: Patient is examined daily including today on 07/08/2017, exams remain the same as of yesterday except that has changed    General:  Chronically ill appearing, NAD  Cardiovascular: RRR  Respiratory: improved aeration on the right, no wheezing, no rales, no rhonchi  Abdomen: Soft/ND/NT, positive BS  Musculoskeletal: No Edema  Neuro: alert, oriented   Data Reviewed: Basic Metabolic Panel: Recent Labs  Lab 07/03/17 0831 07/04/17 0838 07/06/17 0333 07/07/17 0403  NA 141 140 146* 145  K 3.2* 3.5 4.4 3.5  CL 98* 98* 103 103  CO2 34* 33* 37* 35*  GLUCOSE 90 87 96 96  BUN 18 23* 21* 15  CREATININE 1.37* 1.56* 1.73* 1.37*  CALCIUM 8.6* 9.0 8.8* 8.5*   Liver Function Tests: Recent Labs  Lab 07/03/17 0831  AST 25  ALT 20  ALKPHOS 87  BILITOT 0.6  PROT 5.6*  ALBUMIN 2.8*   No results for input(s): LIPASE, AMYLASE in the last 168 hours. No results for input(s): AMMONIA in the last 168 hours. CBC: Recent Labs  Lab 07/03/17 0831 07/04/17 0838 07/06/17 0333 07/07/17 0403  WBC 12.8* 14.7* 10.3 12.2*  NEUTROABS 9.6* 11.3 9.6* 9.3*  HGB 8.2* 7.9* 7.5* 7.4*  HCT 25.2* 24.7* 23.1* 23.7*  MCV 103.7* 104.2* 103.6* 105.8*  PLT 201 194 168 157   Cardiac Enzymes:   Recent Labs  Lab 07/04/17 0838  TROPONINI 0.08*   BNP (last 3 results) Recent  Labs    06/25/17 0750 06/30/17 0812 07/04/17 2146  BNP 89.9 224.5* 168.6*    ProBNP (last 3 results) No results for input(s): PROBNP in the last 8760 hours.  CBG: No results for input(s): GLUCAP in the last 168 hours.  Recent Results (from the past 240 hour(s))  MRSA PCR Screening     Status: None   Collection Time: 06/30/17  7:24 PM  Result Value Ref Range Status   MRSA by PCR NEGATIVE NEGATIVE Final    Comment:        The GeneXpert MRSA Assay (FDA approved for NASAL specimens only), is one component of a comprehensive MRSA colonization surveillance program. It is not intended to diagnose MRSA infection nor to guide or monitor treatment for MRSA infections. Performed at [facility], [place] [address], [place], [place] [address]      Studies: No results found.  Scheduled Meds: . apixaban  5 mg Oral BID  . escitalopram  5 mg  Oral QHS  . feeding supplement (ENSURE ENLIVE)  237 mL Oral TID BM  . fluticasone furoate-vilanterol  1 puff Inhalation Daily   And  . umeclidinium bromide  1 puff Inhalation Daily  . guaiFENesin  1,200 mg Oral BID  . loratadine  10 mg Oral Daily  . metoprolol tartrate  12.5 mg Oral BID  . pantoprazole  40 mg Oral BID  . potassium chloride  40 mEq Oral Once  . senna-docusate  1 tablet Oral BID  . sodium chloride flush  10-40 mL Intracatheter Q12H    Continuous Infusions:   Time spent: 25 mins, case discussed with palliative care I have personally reviewed and interpreted on  07/08/2017 daily labs, tele strips, imagings as discussed above under date review session and assessment and plans.  I reviewed all nursing notes,  vitals, pertinent old records  I have discussed plan of care as described above with RN , patient  on 07/08/2017   Florencia Reasons MD, PhD  [facility] Pager [phone]. If 7PM-7AM, please contact night-coverage at [email or web address], password [facility] 07/08/2017, 2:56 PM  LOS: 4 days

## 2017-07-08 NOTE — Progress Notes (Signed)
CSW introduced patient to admissions staff from [facility]. Staff met with patient at bedside to conduct an assessment. Staff agreed to update CSW after assessment with decision about ability to offer patient a bed. CSW will continue to seek placement for patient.  Abundio Miu, [place] Social Worker [facility] Cell#: [phone]

## 2017-07-08 NOTE — Progress Notes (Addendum)
Palliative:  [name] continues to be in better spirits today. He says that his chest pain s/p thoracentesis is finally beginning to ease up for him. CSW working on plans for SNF placement - he agrees with this plan now that his mother agrees. He has eaten all his lunch tray. He understands that he needs more assistance and help than he has at ALF. He is anxious but hopeful that he will be able to go somewhere where he feels safe and can manage his health problems and SOB. He asked about how he will get his stuff from ALF and mainly concerned about his medical equipment (oxygen, neb machine) and I explained that this will be sorted out with the suppliers of equipment and SNF could make sure this equipment is available for him at the new facility. We discussed that his family (he has talked about a nephew) could help get his clothes and personal belongings if placement is worked out for him. All questions/concerns addressed. Emotional support provided.   I attempted to call his mother at his request but no answer.   Goals are mainly to feel safe and supported when talking with [name]. He is interested in pursuing available therapies to prolong life (outside DNR) if appropriately available to him. He has very poor understanding and insight into the details of his disease process and symptoms and often changes his mind.   15 min  Vinie Sill, NP Palliative Medicine Team Pager # [phone] (M-F 8a-5p) Team Phone # [phone] (Nights/Weekends)

## 2017-07-09 DIAGNOSIS — N183 Chronic kidney disease, stage 3 (moderate): Secondary | ICD-10-CM

## 2017-07-09 DIAGNOSIS — R0902 Hypoxemia: Secondary | ICD-10-CM

## 2017-07-09 DIAGNOSIS — I2699 Other pulmonary embolism without acute cor pulmonale: Secondary | ICD-10-CM

## 2017-07-09 DIAGNOSIS — J9621 Acute and chronic respiratory failure with hypoxia: Principal | ICD-10-CM

## 2017-07-09 DIAGNOSIS — J90 Pleural effusion, not elsewhere classified: Secondary | ICD-10-CM

## 2017-07-09 DIAGNOSIS — I10 Essential (primary) hypertension: Secondary | ICD-10-CM

## 2017-07-09 DIAGNOSIS — I5042 Chronic combined systolic (congestive) and diastolic (congestive) heart failure: Secondary | ICD-10-CM

## 2017-07-09 LAB — BASIC METABOLIC PANEL
ANION GAP: 7 (ref 5–15)
BUN: 16 mg/dL (ref 6–20)
CALCIUM: 8.9 mg/dL (ref 8.9–10.3)
CO2: 35 mmol/L — ABNORMAL HIGH (ref 22–32)
Chloride: 103 mmol/L (ref 101–111)
Creatinine, Ser: 1.7 mg/dL — ABNORMAL HIGH (ref 0.61–1.24)
GFR calc Af Amer: 50 mL/min — ABNORMAL LOW (ref >60)
GFR, EST NON AFRICAN AMERICAN: 43 mL/min — AB (ref >60)
GLUCOSE: 98 mg/dL (ref 65–99)
POTASSIUM: 3.8 mmol/L (ref 3.5–5.1)
Sodium: 145 mmol/L (ref 135–145)

## 2017-07-09 LAB — CBC WITH DIFFERENTIAL/PLATELET
BASOS ABS: 0 10*3/uL (ref 0.0–0.1)
BASOS PCT: 0 %
EOS ABS: 0 10*3/uL (ref 0.0–0.7)
EOS PCT: 0 %
HCT: 23.4 % — ABNORMAL LOW (ref 39.0–52.0)
Hemoglobin: 7.6 g/dL — ABNORMAL LOW (ref 13.0–17.0)
LYMPHS PCT: 12 %
Lymphs Abs: 1.4 10*3/uL (ref 0.7–4.0)
MCH: 33.9 pg (ref 26.0–34.0)
MCHC: 32.5 g/dL (ref 30.0–36.0)
MCV: 104.5 fL — ABNORMAL HIGH (ref 78.0–100.0)
MONO ABS: 1.2 10*3/uL — AB (ref 0.1–1.0)
Monocytes Relative: 11 %
Neutro Abs: 8.7 10*3/uL — ABNORMAL HIGH (ref 1.7–7.7)
Neutrophils Relative %: 77 %
PLATELETS: 167 10*3/uL (ref 150–400)
RBC: 2.24 MIL/uL — AB (ref 4.22–5.81)
RDW: 18.1 % — AB (ref 11.5–15.5)
WBC: 11.2 10*3/uL — AB (ref 4.0–10.5)

## 2017-07-09 NOTE — Progress Notes (Signed)
CSW contacted by [facility] and informed that they are able to offer patient a bed.  CSW spoke with patient at bedside and provided update. Patient reported that he no longer wanted to go to SNF and that he plans to return to [name]. CSW and patient spoke at length about the difference between the levels of care. Patient consistently declined SNF and reported that he planned to return to [name]. Patient's RNCM also spoke with patient about discharge plan, patient delined SNF again. Patient's RN and RNCM notified patient's attending MD of patient's discharge plan back to [facility].  CSW will complete patient's FL2 and contact [facility] to confirm patient's ability to return.   CSW will continue to follow and assist patient with discharge planning.  Abundio Miu, [place] Social Worker [facility] Cell#: [phone]

## 2017-07-09 NOTE — NC FL2 (Addendum)
[place] LEVEL OF CARE SCREENING TOOL     IDENTIFICATION  Patient Name: [name] Birthdate: 09/01/1960 Sex: male Admission Date (Current Location): 07/04/2017  [facility] and [place] Number:  Herbalist and Address:  [facility],  [place], [place]      Provider Number: [id number]  Attending Physician Name and Address:  Tawni Millers  Relative Name and Phone Number:       Current Level of Care: Hospital Recommended Level of Care: Other (Comment)(Assisted Living Facility) Prior Approval Number:    Date Approved/Denied:   PASRR Number: [id number]  Discharge Plan: Other (Comment)(Assisted Living Facility)    Current Diagnoses: Patient Active Problem List   Diagnosis Date Noted  . PE (pulmonary thromboembolism) ([place]) 06/30/2017  . Pulmonary embolism ([place]) 06/30/2017  . AKI (acute kidney injury) ([place]) 06/25/2017  . Dyspnea 06/21/2017  . Chronic combined systolic (congestive) and diastolic (congestive) heart failure ([place])   . Constipation 06/07/2017  . Pain   . Acute on chronic respiratory failure with hypoxia ([place]) 05/28/2017  . Chronic respiratory failure with hypoxia ([place]) 04/01/2017  . Leucocytosis 03/16/2017  . CKD (chronic kidney disease), stage III ([place]) 03/12/2017  . Malnutrition of moderate degree 03/10/2017  . COPD exacerbation ([place]) 03/09/2017  . Anemia 02/07/2017  . Severe malnutrition ([place]) 01/03/2017  . DOE (dyspnea on exertion) 12/30/2016  . Nausea   . Hypervolemia   . HCAP (healthcare-associated pneumonia) 12/19/2016  . SOB (shortness of breath) 12/19/2016  . Erosive gastropathy 12/18/2016  . Gastritis 12/18/2016  . Hematemesis 12/16/2016  . Intractable nausea and vomiting 12/16/2016  . HTN (hypertension) 10/30/2016  . Chronic obstructive pulmonary disease ([place]) 09/02/2016  . COPD (chronic obstructive pulmonary disease) ([place]) 08/19/2016  . Dehydration 06/04/2016  . Abdominal  pain 06/04/2016  . 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 Adenocarcinoma of left lung, stage 4 ([place]) 05/02/2016  . Goals of care, counseling/discussion 05/02/2016  . Encounter for antineoplastic chemotherapy 05/02/2016  . Tobacco abuse 05/30/2011  . Alcohol abuse 05/30/2011  . Seizure ([place]) 05/28/2011    Orientation RESPIRATION BLADDER Height & Weight     Self, Time, Situation, Place  O2 Continent Weight: 116 lb (52.6 kg) Height:  5\' 11"  (180.3 cm)  BEHAVIORAL SYMPTOMS/MOOD NEUROLOGICAL BOWEL NUTRITION STATUS      Continent Diet(see dc summary)  AMBULATORY STATUS COMMUNICATION OF NEEDS Skin   Limited Assist Verbally Normal                       Personal Care Assistance Level of Assistance  Bathing, Feeding, Dressing Bathing Assistance: Limited assistance Feeding assistance: Independent Dressing Assistance: Limited assistance     Functional Limitations Info  Sight, Hearing, Speech Sight Info: Adequate Hearing Info: Adequate Speech Info: Adequate    SPECIAL CARE FACTORS FREQUENCY                       Contractures Contractures Info: Not present    Additional Factors Info  Code Status, Allergies Code Status Info: DNR Allergies Info: NKA              Discharge Medications:  Medication List    STOP taking these medications   fluconazole 100 MG tablet Commonly known as:  DIFLUCAN   magnesium hydroxide 400 MG/5ML suspension Commonly known as:  MILK OF MAGNESIA     TAKE these medications   acetaminophen 500 MG tablet Commonly known as:  TYLENOL Take  1 tablet (500 mg total) by mouth 2 (two) times daily as needed for mild pain. What changed:  how much to take   albuterol (2.5 MG/3ML) 0.083% nebulizer solution Commonly known as:  PROVENTIL 1 neb every 4-6 hours as needed for wheezing and shortness of breath What changed:    how much to take  how to take this  when to take this  reasons to take this  additional instructions    ALKA-SELTZER PLUS COLD PO Take 2 tablets by mouth at bedtime as needed (COUGH).   apixaban 5 MG Tabs tablet Commonly known as:  ELIQUIS Take 2 tablets (10 mg total) by mouth 2 (two) times daily for 5 days.   apixaban 5 MG Tabs tablet Commonly known as:  ELIQUIS Take 1 tablet (5 mg total) by mouth 2 (two) times daily.   dexamethasone 4 MG tablet Commonly known as:  DECADRON Take 4 mg by mouth 2 (two) times daily. The day before, the day of, and the day after chemotherapy every three weeks   escitalopram 5 MG tablet Commonly known as:  LEXAPRO Take 1 tablet (5 mg total) by mouth at bedtime.   feeding supplement (ENSURE ENLIVE) Liqd Take 237 mLs by mouth 3 (three) times daily between meals.   Fluticasone-Umeclidin-Vilant 100-62.5-25 MCG/INH Aepb Commonly known as:  TRELEGY ELLIPTA Inhale 1 puff into the lungs daily.   furosemide 20 MG tablet Commonly known as:  LASIX Take 1 tablet (20 mg total) by mouth daily.   Ipratropium-Albuterol 20-100 MCG/ACT Aers respimat Commonly known as:  COMBIVENT Inhale 1 puff into the lungs every 6 (six) hours. What changed:    when to take this  reasons to take this   loratadine 10 MG tablet Commonly known as:  CLARITIN Take 10 mg by mouth daily.   metoprolol tartrate 25 MG tablet Commonly known as:  LOPRESSOR Take 12.5 mg by mouth 2 (two) times daily.   MUCINEX 600 MG 12 hr tablet Generic drug:  guaiFENesin Take 1,200 mg by mouth 2 (two) times daily.   multivitamin with minerals tablet Take 1 tablet by mouth daily.   ondansetron 4 MG disintegrating tablet Commonly known as:  ZOFRAN-ODT Take 4 mg by mouth every 8 (eight) hours as needed for nausea or vomiting.   Oxycodone HCl 10 MG Tabs Take 10 mg by mouth every 4 (four) hours as needed (pain/shortness of breath).   pantoprazole 40 MG tablet Commonly known as:  PROTONIX Take 1 tablet (40 mg total) by mouth 2 (two) times daily.   predniSONE 20 MG  tablet Commonly known as:  DELTASONE Take 40 mg by mouth daily with breakfast.   senna 8.6 MG Tabs tablet Commonly known as:  SENOKOT Take 1 tablet by mouth 2 (two) times daily.   TUSSIN DM 10-100 MG/5ML liquid Generic drug:  Dextromethorphan-guaiFENesin Take 5 mLs by mouth every 12 (twelve) hours. Notes to patient:  This is what will help thin out your secretions so that you can get them up better    Relevant Imaging Results:  Relevant Lab Results:   Additional Information SSN [id number]  [name], LCSW

## 2017-07-09 NOTE — Progress Notes (Signed)
Report called to Izora Ribas Resident care Mudlogger at [facility]

## 2017-07-09 NOTE — Progress Notes (Signed)
Patient returning to [name]. [facility] staff member Cervante confirmed patient's ability to return. CSW faxed clinical documents to ALF. [facility] contacted, patient declined for family to be contacted. Packet complete, patient's RN can call report to [phone]. CSW signing off, no other needs identified at this time.   Abundio Miu, [place] Social Worker [facility] Cell#: [phone]

## 2017-07-09 NOTE — Care Management Note (Signed)
Case Management Note  Patient Details  Name: [name] MRN: [id number] Date of Birth: [birth year]  Subjective/Objective:                    Action/Plan:   Expected Discharge Date:  07/09/17               Expected Discharge Plan:  Assisted Living / Rest Home  In-House Referral:  Clinical Social Work  Discharge planning Services  CM Consult  Post Acute Care Choice:    Choice offered to:  Patient  DME Arranged:    DME Agency:     HH Arranged:    [place] Agency:     Status of Service:  Completed, signed off  If discussed at [employer], dates discussed:    Additional CommentsPurcell Mouton, RN 07/09/2017, 3:09 PM

## 2017-07-09 NOTE — Discharge Summary (Signed)
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 POCBNP CBG: No results for input(s): GLUCAP in the last 168 hours. D-Dimer No results for input(s): DDIMER in the last 72 hours. Hgb A1c No results for input(s): HGBA1C in the last 72 hours. Lipid Profile No results for input(s): CHOL, HDL, LDLCALC, TRIG, CHOLHDL, LDLDIRECT in the last 72 hours. Thyroid function studies No results for input(s): TSH, T4TOTAL, T3FREE, THYROIDAB in the last 72 hours.  Invalid input(s): FREET3 Anemia work up No results for input(s): VITAMINB12, FOLATE, FERRITIN, TIBC, IRON, RETICCTPCT in the last 72 hours. Urinalysis    Component Value Date/Time   COLORURINE YELLOW 05/20/2017 0940   APPEARANCEUR CLEAR 05/20/2017 0940   LABSPEC 1.015 05/20/2017 0940   PHURINE 5.0 05/20/2017 0940   GLUCOSEU NEGATIVE 05/20/2017 0940   HGBUR NEGATIVE 05/20/2017 0940   BILIRUBINUR NEGATIVE 05/20/2017 0940   KETONESUR 5 (A) 05/20/2017 0940   PROTEINUR 30 (A) 05/20/2017 0940   UROBILINOGEN 0.2 05/29/2011 0122   NITRITE NEGATIVE  05/20/2017 0940   LEUKOCYTESUR NEGATIVE 05/20/2017 0940   Sepsis Labs Invalid input(s): PROCALCITONIN,  WBC,  LACTICIDVEN Microbiology Recent Results (from the past 240 hour(s))  MRSA PCR Screening     Status: None   Collection Time: 06/30/17  7:24 PM  Result Value Ref Range Status   MRSA by PCR NEGATIVE NEGATIVE Final    Comment:        The GeneXpert MRSA Assay (FDA approved for NASAL specimens only), is one component of a comprehensive MRSA colonization surveillance program. It is not intended to diagnose MRSA infection nor to guide or monitor treatment for MRSA infections. Performed at [facility], [place] [address], [place], [place] [address]      Time coordinating discharge: 45 minutes  SIGNED:   Tawni Millers, MD  [facility] 07/09/2017, 12:52 PM Pager [phone]  If 7PM-7AM, please contact night-coverage [email or web address] Password [email or web address]

## 2017-07-09 NOTE — Progress Notes (Signed)
Patient discharged to [name] via [place]. All clothing and belonging given to patient

## 2017-07-10 ENCOUNTER — Observation Stay (HOSPITAL_COMMUNITY)
Admission: EM | Admit: 2017-07-10 | Discharge: 2017-07-12 | Disposition: A | Discharge status: Nursing Home (Includes ICF) | Payer: Medicaid Other | Attending: Internal Medicine | Admitting: Internal Medicine

## 2017-07-10 ENCOUNTER — Encounter (HOSPITAL_COMMUNITY): Payer: Self-pay | Admitting: Emergency Medicine

## 2017-07-10 DIAGNOSIS — C3492 Malignant neoplasm of unspecified part of left bronchus or lung: Principal | ICD-10-CM | POA: Insufficient documentation

## 2017-07-10 DIAGNOSIS — R079 Chest pain, unspecified: Secondary | ICD-10-CM | POA: Insufficient documentation

## 2017-07-10 DIAGNOSIS — F17210 Nicotine dependence, cigarettes, uncomplicated: Secondary | ICD-10-CM | POA: Insufficient documentation

## 2017-07-10 DIAGNOSIS — D72829 Elevated white blood cell count, unspecified: Secondary | ICD-10-CM | POA: Insufficient documentation

## 2017-07-10 DIAGNOSIS — Z86711 Personal history of pulmonary embolism: Secondary | ICD-10-CM | POA: Insufficient documentation

## 2017-07-10 DIAGNOSIS — R0602 Shortness of breath: Secondary | ICD-10-CM | POA: Diagnosis present

## 2017-07-10 DIAGNOSIS — Z923 Personal history of irradiation: Secondary | ICD-10-CM | POA: Insufficient documentation

## 2017-07-10 DIAGNOSIS — D630 Anemia in neoplastic disease: Secondary | ICD-10-CM | POA: Insufficient documentation

## 2017-07-10 DIAGNOSIS — I10 Essential (primary) hypertension: Secondary | ICD-10-CM | POA: Diagnosis present

## 2017-07-10 DIAGNOSIS — G8929 Other chronic pain: Secondary | ICD-10-CM | POA: Diagnosis not present

## 2017-07-10 DIAGNOSIS — I130 Hypertensive heart and chronic kidney disease with heart failure and stage 1 through stage 4 chronic kidney disease, or unspecified chronic kidney disease: Secondary | ICD-10-CM | POA: Insufficient documentation

## 2017-07-10 DIAGNOSIS — J95811 Postprocedural pneumothorax: Secondary | ICD-10-CM | POA: Diagnosis not present

## 2017-07-10 DIAGNOSIS — D539 Nutritional anemia, unspecified: Secondary | ICD-10-CM | POA: Diagnosis not present

## 2017-07-10 DIAGNOSIS — Z79899 Other long term (current) drug therapy: Secondary | ICD-10-CM | POA: Diagnosis not present

## 2017-07-10 DIAGNOSIS — Z66 Do not resuscitate: Secondary | ICD-10-CM | POA: Insufficient documentation

## 2017-07-10 DIAGNOSIS — A419 Sepsis, unspecified organism: Secondary | ICD-10-CM

## 2017-07-10 DIAGNOSIS — Z720 Tobacco use: Secondary | ICD-10-CM | POA: Diagnosis present

## 2017-07-10 DIAGNOSIS — Z9981 Dependence on supplemental oxygen: Secondary | ICD-10-CM | POA: Diagnosis not present

## 2017-07-10 DIAGNOSIS — K2970 Gastritis, unspecified, without bleeding: Secondary | ICD-10-CM | POA: Insufficient documentation

## 2017-07-10 DIAGNOSIS — I5042 Chronic combined systolic (congestive) and diastolic (congestive) heart failure: Secondary | ICD-10-CM | POA: Diagnosis not present

## 2017-07-10 DIAGNOSIS — C7951 Secondary malignant neoplasm of bone: Secondary | ICD-10-CM | POA: Insufficient documentation

## 2017-07-10 DIAGNOSIS — J449 Chronic obstructive pulmonary disease, unspecified: Secondary | ICD-10-CM | POA: Insufficient documentation

## 2017-07-10 DIAGNOSIS — R109 Unspecified abdominal pain: Secondary | ICD-10-CM

## 2017-07-10 DIAGNOSIS — D638 Anemia in other chronic diseases classified elsewhere: Secondary | ICD-10-CM

## 2017-07-10 DIAGNOSIS — J90 Pleural effusion, not elsewhere classified: Secondary | ICD-10-CM

## 2017-07-10 DIAGNOSIS — J9620 Acute and chronic respiratory failure, unspecified whether with hypoxia or hypercapnia: Secondary | ICD-10-CM | POA: Diagnosis present

## 2017-07-10 DIAGNOSIS — J910 Malignant pleural effusion: Secondary | ICD-10-CM | POA: Insufficient documentation

## 2017-07-10 DIAGNOSIS — J939 Pneumothorax, unspecified: Secondary | ICD-10-CM

## 2017-07-10 DIAGNOSIS — Z7901 Long term (current) use of anticoagulants: Secondary | ICD-10-CM | POA: Diagnosis not present

## 2017-07-10 DIAGNOSIS — Y658 Other specified misadventures during surgical and medical care: Secondary | ICD-10-CM | POA: Insufficient documentation

## 2017-07-10 DIAGNOSIS — N183 Chronic kidney disease, stage 3 unspecified: Secondary | ICD-10-CM | POA: Diagnosis present

## 2017-07-10 DIAGNOSIS — Z9889 Other specified postprocedural states: Secondary | ICD-10-CM

## 2017-07-10 MED ORDER — ALBUTEROL SULFATE (2.5 MG/3ML) 0.083% IN NEBU
5.0000 mg | INHALATION_SOLUTION | Freq: Once | RESPIRATORY_TRACT | Status: AC
  Administered 2017-07-10: 5 mg via RESPIRATORY_TRACT
  Filled 2017-07-10: qty 6

## 2017-07-10 NOTE — ED Notes (Signed)
Bed: NP00 Expected date:  Expected time:  Means of arrival:  Comments: EMS shortness of breath

## 2017-07-10 NOTE — ED Triage Notes (Signed)
[facility] comes from [name], sob with walking and anxiety  Present.  Stage 4 lung cancer, port. DNR.  V/s on arrival 146/90, pluse 78, rr20 , 100 on 4 liters.

## 2017-07-11 ENCOUNTER — Observation Stay (HOSPITAL_COMMUNITY): Payer: Medicaid Other

## 2017-07-11 ENCOUNTER — Other Ambulatory Visit: Payer: Self-pay

## 2017-07-11 ENCOUNTER — Other Ambulatory Visit: Payer: Self-pay | Admitting: Cardiothoracic Surgery

## 2017-07-11 ENCOUNTER — Encounter (HOSPITAL_COMMUNITY): Payer: Self-pay

## 2017-07-11 ENCOUNTER — Emergency Department (HOSPITAL_COMMUNITY): Payer: Medicaid Other

## 2017-07-11 DIAGNOSIS — D72829 Elevated white blood cell count, unspecified: Secondary | ICD-10-CM

## 2017-07-11 DIAGNOSIS — J9620 Acute and chronic respiratory failure, unspecified whether with hypoxia or hypercapnia: Secondary | ICD-10-CM | POA: Diagnosis not present

## 2017-07-11 DIAGNOSIS — C3492 Malignant neoplasm of unspecified part of left bronchus or lung: Secondary | ICD-10-CM | POA: Diagnosis not present

## 2017-07-11 DIAGNOSIS — N183 Chronic kidney disease, stage 3 (moderate): Secondary | ICD-10-CM

## 2017-07-11 DIAGNOSIS — D539 Nutritional anemia, unspecified: Secondary | ICD-10-CM

## 2017-07-11 DIAGNOSIS — R1013 Epigastric pain: Secondary | ICD-10-CM

## 2017-07-11 DIAGNOSIS — I10 Essential (primary) hypertension: Secondary | ICD-10-CM

## 2017-07-11 DIAGNOSIS — J90 Pleural effusion, not elsewhere classified: Secondary | ICD-10-CM

## 2017-07-11 DIAGNOSIS — Z86711 Personal history of pulmonary embolism: Secondary | ICD-10-CM | POA: Diagnosis not present

## 2017-07-11 DIAGNOSIS — D638 Anemia in other chronic diseases classified elsewhere: Secondary | ICD-10-CM

## 2017-07-11 DIAGNOSIS — Z720 Tobacco use: Secondary | ICD-10-CM

## 2017-07-11 LAB — CBC WITH DIFFERENTIAL/PLATELET
BASOS PCT: 0 %
Basophils Absolute: 0 10*3/uL (ref 0.0–0.1)
Eosinophils Absolute: 0 10*3/uL (ref 0.0–0.7)
Eosinophils Relative: 0 %
HEMATOCRIT: 24 % — AB (ref 39.0–52.0)
HEMOGLOBIN: 7.7 g/dL — AB (ref 13.0–17.0)
LYMPHS ABS: 0.8 10*3/uL (ref 0.7–4.0)
LYMPHS PCT: 7 %
MCH: 33.3 pg (ref 26.0–34.0)
MCHC: 32.1 g/dL (ref 30.0–36.0)
MCV: 103.9 fL — AB (ref 78.0–100.0)
MONO ABS: 0.7 10*3/uL (ref 0.1–1.0)
MONOS PCT: 6 %
NEUTROS ABS: 10.7 10*3/uL — AB (ref 1.7–7.7)
NEUTROS PCT: 87 %
Platelets: 178 10*3/uL (ref 150–400)
RBC: 2.31 MIL/uL — ABNORMAL LOW (ref 4.22–5.81)
RDW: 18.2 % — AB (ref 11.5–15.5)
WBC: 12.2 10*3/uL — ABNORMAL HIGH (ref 4.0–10.5)

## 2017-07-11 LAB — BASIC METABOLIC PANEL
ANION GAP: 9 (ref 5–15)
Anion gap: 10 (ref 5–15)
BUN: 28 mg/dL — ABNORMAL HIGH (ref 6–20)
BUN: 26 mg/dL — AB (ref 6–20)
CALCIUM: 9.1 mg/dL (ref 8.9–10.3)
CHLORIDE: 100 mmol/L — AB (ref 101–111)
CO2: 33 mmol/L — AB (ref 22–32)
CO2: 33 mmol/L — ABNORMAL HIGH (ref 22–32)
CREATININE: 1.66 mg/dL — AB (ref 0.61–1.24)
Calcium: 9.2 mg/dL (ref 8.9–10.3)
Chloride: 100 mmol/L — ABNORMAL LOW (ref 101–111)
Creatinine, Ser: 1.69 mg/dL — ABNORMAL HIGH (ref 0.61–1.24)
GFR calc non Af Amer: 44 mL/min — ABNORMAL LOW (ref >60)
GFR, EST AFRICAN AMERICAN: 50 mL/min — AB (ref >60)
GFR, EST AFRICAN AMERICAN: 51 mL/min — AB (ref >60)
GFR, EST NON AFRICAN AMERICAN: 43 mL/min — AB (ref >60)
GLUCOSE: 127 mg/dL — AB (ref 65–99)
Glucose, Bld: 120 mg/dL — ABNORMAL HIGH (ref 65–99)
POTASSIUM: 4.4 mmol/L (ref 3.5–5.1)
Potassium: 4.5 mmol/L (ref 3.5–5.1)
SODIUM: 142 mmol/L (ref 135–145)
Sodium: 143 mmol/L (ref 135–145)

## 2017-07-11 LAB — BODY FLUID CELL COUNT WITH DIFFERENTIAL
EOS FL: 0 %
Lymphs, Fluid: 36 %
Monocyte-Macrophage-Serous Fluid: 4 % — ABNORMAL LOW (ref 50–90)
Neutrophil Count, Fluid: 60 % — ABNORMAL HIGH (ref 0–25)
Total Nucleated Cell Count, Fluid: 320 cu mm (ref 0–1000)

## 2017-07-11 LAB — I-STAT CHEM 8, ED
BUN: 26 mg/dL — AB (ref 6–20)
CHLORIDE: 98 mmol/L — AB (ref 101–111)
CREATININE: 1.6 mg/dL — AB (ref 0.61–1.24)
Calcium, Ion: 1.22 mmol/L (ref 1.15–1.40)
Glucose, Bld: 125 mg/dL — ABNORMAL HIGH (ref 65–99)
HEMATOCRIT: 20 % — AB (ref 39.0–52.0)
Hemoglobin: 6.8 g/dL — CL (ref 13.0–17.0)
Potassium: 4.4 mmol/L (ref 3.5–5.1)
Sodium: 138 mmol/L (ref 135–145)
TCO2: 31 mmol/L (ref 22–32)

## 2017-07-11 LAB — CBC
HCT: 24.6 % — ABNORMAL LOW (ref 39.0–52.0)
HEMOGLOBIN: 7.8 g/dL — AB (ref 13.0–17.0)
MCH: 33.1 pg (ref 26.0–34.0)
MCHC: 31.7 g/dL (ref 30.0–36.0)
MCV: 104.2 fL — ABNORMAL HIGH (ref 78.0–100.0)
PLATELETS: 175 10*3/uL (ref 150–400)
RBC: 2.36 MIL/uL — ABNORMAL LOW (ref 4.22–5.81)
RDW: 18.4 % — ABNORMAL HIGH (ref 11.5–15.5)
WBC: 13.3 10*3/uL — AB (ref 4.0–10.5)

## 2017-07-11 LAB — GRAM STAIN

## 2017-07-11 LAB — I-STAT TROPONIN, ED: TROPONIN I, POC: 0.02 ng/mL (ref 0.00–0.08)

## 2017-07-11 LAB — PROTEIN, PLEURAL OR PERITONEAL FLUID: TOTAL PROTEIN, FLUID: 3.5 g/dL

## 2017-07-11 LAB — BRAIN NATRIURETIC PEPTIDE: B Natriuretic Peptide: 148.3 pg/mL — ABNORMAL HIGH (ref 0.0–100.0)

## 2017-07-11 LAB — C-REACTIVE PROTEIN: CRP: 4.8 mg/dL — AB (ref <1.0)

## 2017-07-11 LAB — PROCALCITONIN: Procalcitonin: 0.42 ng/mL

## 2017-07-11 LAB — LACTIC ACID, PLASMA: Lactic Acid, Venous: 2.7 mmol/L (ref 0.5–1.9)

## 2017-07-11 MED ORDER — ORAL CARE MOUTH RINSE: 15.0000 mL | Freq: Two times a day (BID) | OROMUCOSAL | Status: DC

## 2017-07-11 MED ORDER — PIPERACILLIN-TAZOBACTAM 3.375 G IVPB
3.3750 g | Freq: Three times a day (TID) | INTRAVENOUS | Status: DC
Start: 2017-07-11 — End: 2017-07-12
  Administered 2017-07-11 – 2017-07-12 (×4): 3.375 g via INTRAVENOUS
  Filled 2017-07-11 (×4): qty 50

## 2017-07-11 MED ORDER — ESCITALOPRAM OXALATE 10 MG PO TABS
5.0000 mg | ORAL_TABLET | Freq: Every day | ORAL | Status: DC
  Administered 2017-07-11 – 2017-07-12 (×2): 5 mg via ORAL
  Filled 2017-07-11 (×2): qty 1

## 2017-07-11 MED ORDER — SENNOSIDES-DOCUSATE SODIUM 8.6-50 MG PO TABS
2.0000 | ORAL_TABLET | Freq: Two times a day (BID) | ORAL | Status: DC
  Administered 2017-07-11 – 2017-07-12 (×3): 2 via ORAL
  Filled 2017-07-11 (×3): qty 2

## 2017-07-11 MED ORDER — UMECLIDINIUM-VILANTEROL 62.5-25 MCG/INH IN AEPB
1.0000 | INHALATION_SPRAY | Freq: Every day | RESPIRATORY_TRACT | Status: DC
  Administered 2017-07-12: 1 via RESPIRATORY_TRACT
  Filled 2017-07-11: qty 14

## 2017-07-11 MED ORDER — FUROSEMIDE 20 MG PO TABS
20.0000 mg | ORAL_TABLET | Freq: Every day | ORAL | Status: DC
  Administered 2017-07-11: 20 mg via ORAL
  Filled 2017-07-11: qty 1

## 2017-07-11 MED ORDER — BUDESONIDE 0.25 MG/2ML IN SUSP
0.2500 mg | Freq: Two times a day (BID) | RESPIRATORY_TRACT | Status: DC
  Administered 2017-07-11 – 2017-07-12 (×3): 0.25 mg via RESPIRATORY_TRACT
  Filled 2017-07-11 (×4): qty 2

## 2017-07-11 MED ORDER — LIDOCAINE HCL 1 % IJ SOLN
INTRAMUSCULAR | Status: AC
  Filled 2017-07-11: qty 10

## 2017-07-11 MED ORDER — ACETAMINOPHEN 650 MG RE SUPP: 650.0000 mg | Freq: Four times a day (QID) | RECTAL | Status: DC | PRN

## 2017-07-11 MED ORDER — VANCOMYCIN HCL IN DEXTROSE 750-5 MG/150ML-% IV SOLN
750.0000 mg | INTRAVENOUS | Status: DC
  Filled 2017-07-11: qty 150

## 2017-07-11 MED ORDER — IPRATROPIUM-ALBUTEROL 0.5-2.5 (3) MG/3ML IN SOLN
3.0000 mL | RESPIRATORY_TRACT | Status: DC | PRN
  Administered 2017-07-11 – 2017-07-12 (×3): 3 mL via RESPIRATORY_TRACT

## 2017-07-11 MED ORDER — FLUTICASONE-UMECLIDIN-VILANT 100-62.5-25 MCG/INH IN AEPB: 1.0000 | INHALATION_SPRAY | Freq: Every day | RESPIRATORY_TRACT | Status: DC

## 2017-07-11 MED ORDER — ALBUTEROL SULFATE (2.5 MG/3ML) 0.083% IN NEBU
2.5000 mg | INHALATION_SOLUTION | Freq: Four times a day (QID) | RESPIRATORY_TRACT | Status: DC
  Filled 2017-07-11: qty 3

## 2017-07-11 MED ORDER — ONDANSETRON HCL 4 MG PO TABS
4.0000 mg | ORAL_TABLET | Freq: Four times a day (QID) | ORAL | Status: DC | PRN
  Administered 2017-07-12: 4 mg via ORAL
  Filled 2017-07-11: qty 1

## 2017-07-11 MED ORDER — GABAPENTIN 100 MG PO CAPS
100.0000 mg | ORAL_CAPSULE | Freq: Three times a day (TID) | ORAL | Status: DC
  Administered 2017-07-11 – 2017-07-12 (×6): 100 mg via ORAL
  Filled 2017-07-11 (×6): qty 1

## 2017-07-11 MED ORDER — ADULT MULTIVITAMIN W/MINERALS CH
1.0000 | ORAL_TABLET | Freq: Every day | ORAL | Status: DC
Start: 2017-07-11 — End: 2017-07-13
  Administered 2017-07-11 – 2017-07-12 (×2): 1 via ORAL
  Filled 2017-07-11 (×2): qty 1

## 2017-07-11 MED ORDER — OXYCODONE HCL 5 MG PO TABS
10.0000 mg | ORAL_TABLET | ORAL | Status: DC | PRN
  Administered 2017-07-11 – 2017-07-12 (×3): 10 mg via ORAL
  Filled 2017-07-11 (×4): qty 2

## 2017-07-11 MED ORDER — APIXABAN 5 MG PO TABS
5.0000 mg | ORAL_TABLET | Freq: Two times a day (BID) | ORAL | Status: DC
Start: 2017-07-11 — End: 2017-07-13
  Administered 2017-07-11 – 2017-07-12 (×4): 5 mg via ORAL
  Filled 2017-07-11 (×4): qty 1

## 2017-07-11 MED ORDER — MORPHINE SULFATE (PF) 2 MG/ML IV SOLN
2.0000 mg | Freq: Once | INTRAVENOUS | Status: AC
  Administered 2017-07-11: 2 mg via INTRAVENOUS
  Filled 2017-07-11: qty 1

## 2017-07-11 MED ORDER — METOPROLOL TARTRATE 25 MG PO TABS
12.5000 mg | ORAL_TABLET | Freq: Two times a day (BID) | ORAL | Status: DC
  Administered 2017-07-11 – 2017-07-12 (×4): 12.5 mg via ORAL
  Filled 2017-07-11 (×4): qty 1

## 2017-07-11 MED ORDER — ALBUTEROL SULFATE (2.5 MG/3ML) 0.083% IN NEBU
2.5000 mg | INHALATION_SOLUTION | Freq: Three times a day (TID) | RESPIRATORY_TRACT | Status: DC
  Administered 2017-07-12 (×3): 2.5 mg via RESPIRATORY_TRACT
  Filled 2017-07-11 (×5): qty 3

## 2017-07-11 MED ORDER — FLUCONAZOLE 100 MG PO TABS
100.0000 mg | ORAL_TABLET | Freq: Every day | ORAL | Status: DC
  Administered 2017-07-11 – 2017-07-12 (×2): 100 mg via ORAL
  Filled 2017-07-11 (×2): qty 1

## 2017-07-11 MED ORDER — APIXABAN 5 MG PO TABS: 5.0000 mg | ORAL_TABLET | Freq: Two times a day (BID) | ORAL | Status: DC

## 2017-07-11 MED ORDER — SODIUM CHLORIDE 0.9% FLUSH
10.0000 mL | INTRAVENOUS | Status: DC | PRN
  Administered 2017-07-12: 10 mL
  Filled 2017-07-11: qty 40

## 2017-07-11 MED ORDER — SUCRALFATE 1 GM/10ML PO SUSP
1.0000 g | Freq: Three times a day (TID) | ORAL | Status: DC
  Administered 2017-07-11 – 2017-07-12 (×7): 1 g via ORAL
  Filled 2017-07-11 (×7): qty 10

## 2017-07-11 MED ORDER — PANTOPRAZOLE SODIUM 40 MG PO TBEC
40.0000 mg | DELAYED_RELEASE_TABLET | Freq: Two times a day (BID) | ORAL | Status: DC
  Administered 2017-07-11 – 2017-07-12 (×4): 40 mg via ORAL
  Filled 2017-07-11 (×5): qty 1

## 2017-07-11 MED ORDER — GUAIFENESIN ER 600 MG PO TB12
1200.0000 mg | ORAL_TABLET | Freq: Two times a day (BID) | ORAL | Status: DC
  Administered 2017-07-11 – 2017-07-12 (×4): 1200 mg via ORAL
  Filled 2017-07-11 (×5): qty 2

## 2017-07-11 MED ORDER — PIPERACILLIN-TAZOBACTAM 3.375 G IVPB 30 MIN: 3.3750 g | Freq: Once | INTRAVENOUS | Status: DC

## 2017-07-11 MED ORDER — ENSURE ENLIVE PO LIQD
237.0000 mL | Freq: Three times a day (TID) | ORAL | Status: DC
  Administered 2017-07-11 – 2017-07-12 (×3): 237 mL via ORAL

## 2017-07-11 MED ORDER — VANCOMYCIN HCL IN DEXTROSE 1-5 GM/200ML-% IV SOLN
1000.0000 mg | Freq: Once | INTRAVENOUS | Status: AC
  Administered 2017-07-11: 1000 mg via INTRAVENOUS
  Filled 2017-07-11: qty 200

## 2017-07-11 MED ORDER — ONDANSETRON HCL 4 MG/2ML IJ SOLN
4.0000 mg | Freq: Four times a day (QID) | INTRAMUSCULAR | Status: DC | PRN
  Administered 2017-07-11: 4 mg via INTRAVENOUS
  Filled 2017-07-11: qty 2

## 2017-07-11 MED ORDER — SODIUM CHLORIDE 0.9% FLUSH
10.0000 mL | Freq: Two times a day (BID) | INTRAVENOUS | Status: DC
  Administered 2017-07-11 – 2017-07-12 (×2): 10 mL

## 2017-07-11 MED ORDER — ACETAMINOPHEN 325 MG PO TABS
650.0000 mg | ORAL_TABLET | Freq: Four times a day (QID) | ORAL | Status: DC | PRN
Start: 2017-07-11 — End: 2017-07-13

## 2017-07-11 MED ORDER — LORATADINE 10 MG PO TABS
10.0000 mg | ORAL_TABLET | Freq: Every day | ORAL | Status: DC
  Administered 2017-07-11 – 2017-07-12 (×2): 10 mg via ORAL
  Filled 2017-07-11 (×2): qty 1

## 2017-07-11 MED ORDER — PREDNISONE 20 MG PO TABS
40.0000 mg | ORAL_TABLET | Freq: Every day | ORAL | Status: DC
  Filled 2017-07-11 (×2): qty 2

## 2017-07-11 MED ORDER — SENNA 8.6 MG PO TABS
1.0000 | ORAL_TABLET | Freq: Two times a day (BID) | ORAL | Status: DC
  Administered 2017-07-11: 8.6 mg via ORAL
  Filled 2017-07-11 (×2): qty 1

## 2017-07-11 NOTE — Plan of Care (Signed)
  Problem: Elimination: Goal: Will not experience complications related to urinary retention Outcome: Progressing   Problem: Pain Managment: Goal: General experience of comfort will improve Outcome: Progressing   Problem: Safety: Goal: Ability to remain free from injury will improve Outcome: Progressing   

## 2017-07-11 NOTE — ED Notes (Signed)
MD notified of abnormal I-stat chem 8.

## 2017-07-11 NOTE — Progress Notes (Signed)
[place] OF CARE NOTE Patient: [name] JWL:[id number]   PCP: [name], MD DOB: Apr 03, 1960   DOA: 07/10/2017   DOS: 07/11/2017    Patient was admitted by my colleague Dr. Tamala Julian earlier on 07/11/2017. I have reviewed the H&P as well as assessment and plan and agree with the same. Important changes in the plan are listed below.  Plan of care: Active Problems:   Tobacco abuse   Adenocarcinoma of left lung, stage 4 (HCC)   HTN (hypertension)   Macrocytic anemia   CKD (chronic kidney disease), stage III (HCC)   Acute on chronic respiratory failure (HCC)   History of pulmonary embolism His primary complaint is abdominal pain. Had extensive work-up in the past for the abdominal pain appears to be chronic.  Will start back on gabapentin. CT 1 week ago reported extensive stool throughout the colon, continues to regimen. Persistent gastritis, continue PPI  Concern for GI bleed although no reported bleeding here.  H&H stable. We will start the patient on regular diet and monitor. Continue anticoagulation    Author: Berle Mull, MD [facility] Pager: [phone] 07/11/2017 11:02 AM   If 7PM-7AM, please contact night-coverage at [email or web address], password [facility]

## 2017-07-11 NOTE — Progress Notes (Signed)
Pt has had two episodes of emesis since arrival to unit with no relief from zofran. First episode appeared to have some blood clots. Second episode was yellow. PO medications held. On call provider made aware. Will continue to monitor.

## 2017-07-11 NOTE — ED Notes (Signed)
ED TO INPATIENT HANDOFF REPORT  Name/Age/Gender [name] 57 y.o. male  Code Status    Code Status Orders  (From admission, onward)        Start     Ordered   07/11/17 0302  Do not attempt resuscitation (DNR)  Continuous    Question Answer Comment  In the event of cardiac or respiratory ARREST Do not call a "code blue"   In the event of cardiac or respiratory ARREST Do not perform Intubation, CPR, defibrillation or ACLS   In the event of cardiac or respiratory ARREST Use medication by any route, position, wound care, and other measures to relive pain and suffering. May use oxygen, suction and manual treatment of airway obstruction as needed for comfort.      07/11/17 0304    Code Status History    Date Active Date Inactive Code Status Order ID Comments User Context   07/04/2017 2356 07/09/2017 2151 DNR [id number]  Etta Quill, DO ED   06/30/2017 1015 07/04/2017 0215 DNR [id number]  Shelly Coss, MD ED   06/25/2017 1117 06/29/2017 1603 Full Code [id number]  Shelly Coss, MD ED   06/21/2017 1044 06/23/2017 2044 DNR [id number]  Mariel Aloe, MD ED   06/05/2017 1602 06/10/2017 2253 Full Code [id number]  Purohit, Konrad Dolores, MD Inpatient   05/28/2017 1147 05/31/2017 2012 DNR [id number]  Nita Sells, MD ED   04/10/2017 1148 04/17/2017 1837 Full Code [id number]  Damita Lack, MD ED   03/09/2017 2359 03/19/2017 1931 Full Code [id number]  Cristy Folks, MD Inpatient   02/14/2017 0902 02/16/2017 1455 Full Code [id number]  Florencia Reasons, MD Inpatient   02/10/2017 0031 02/14/2017 0902 DNR [id number]  Norval Morton, MD ED   12/30/2016 2325 01/07/2017 2109 DNR [id number]  Elodia [name]., MD Inpatient   12/17/2016 0934 12/24/2016 1811 DNR [id number]  Thurnell Lose, MD Inpatient   12/16/2016 1649 12/17/2016 0934 Full Code [id number]  Charlynne Cousins, MD Inpatient      Home/SNF/Other Nursing Home  Chief Complaint Shortness of breath  Level of Care/Admitting Diagnosis ED  Disposition    ED Disposition Condition [place]: [facility] [[id number]]  Level of Care: Telemetry [5]  Admit to tele based on following criteria: Complex arrhythmia (Bradycardia/Tachycardia)  Diagnosis: Acute on chronic respiratory failure [facility]) [[id number]]  Admitting Physician: Norval Morton [[id number]]  Attending Physician: Norval Morton [[id number]]  PT Class (Do Not Modify): Observation [104]  PT Acc Code (Do Not Modify): Observation [10022]       Medical History Past Medical History:  Diagnosis Date  . Abdominal pain 06/04/2016  . Adenocarcinoma of left lung, stage 4 ([place]) 05/02/2016  . Alcohol abuse   . Bronchitis due to tobacco use ([place])   . Cancer ([place])    Lung  . COPD (chronic obstructive pulmonary disease) ([place])   . Dehydration 06/04/2016  . Encounter for antineoplastic chemotherapy 05/02/2016  . Gastritis   . Goals of care, counseling/discussion 05/02/2016  . Hematemesis   . HTN (hypertension) 10/30/2016  . Recurrent upper respiratory infection (URI)   . Seizures ([place] [facility]) 05/2011   new onset  . Shortness of breath     Allergies No Known Allergies  IV Location/Drains/Wounds Patient Lines/Drains/Airways Status   Active Line/Drains/Airways    Name:   Placement date:   Placement time:   Site:   Days:   Implanted Port 05/13/16 Right Chest   05/13/16    -  Chest   424          Labs/Imaging Results for orders placed or performed during the hospital encounter of 07/10/17 (from the past 48 hour(s))  CBC with Differential     Status: Abnormal   Collection Time: 07/10/17 11:50 PM  Result Value Ref Range   WBC 12.2 (H) 4.0 - 10.5 K/uL   RBC 2.31 (L) 4.22 - 5.81 MIL/uL   Hemoglobin 7.7 (L) 13.0 - 17.0 g/dL   HCT 24.0 (L) 39.0 - 52.0 %   MCV 103.9 (H) 78.0 - 100.0 fL   MCH 33.3 26.0 - 34.0 pg   MCHC 32.1 30.0 - 36.0 g/dL   RDW 18.2 (H) 11.5 - 15.5 %   Platelets 178 150 - 400 K/uL   Neutrophils Relative % 87 %   Neutro Abs  10.7 (H) 1.7 - 7.7 K/uL   Lymphocytes Relative 7 %   Lymphs Abs 0.8 0.7 - 4.0 K/uL   Monocytes Relative 6 %   Monocytes Absolute 0.7 0.1 - 1.0 K/uL   Eosinophils Relative 0 %   Eosinophils Absolute 0.0 0.0 - 0.7 K/uL   Basophils Relative 0 %   Basophils Absolute 0.0 0.0 - 0.1 K/uL    Comment: Performed at [facility], [place] [address], [place], [place] [address]  Basic metabolic panel     Status: Abnormal   Collection Time: 07/10/17 11:50 PM  Result Value Ref Range   Sodium 143 135 - 145 mmol/L   Potassium 4.5 3.5 - 5.1 mmol/L   Chloride 100 (L) 101 - 111 mmol/L   CO2 33 (H) 22 - 32 mmol/L   Glucose, Bld 127 (H) 65 - 99 mg/dL   [name] 28 (H) 6 - 20 mg/dL   Creatinine, Ser 1.66 (H) 0.61 - 1.24 mg/dL   Calcium 9.1 8.9 - 10.3 mg/dL   GFR calc non Af Amer 44 (L) >60 mL/min   GFR calc Af Amer 51 (L) >60 mL/min    Comment: (NOTE) The eGFR has been calculated using the CKD EPI equation. This calculation has not been validated in all clinical situations. eGFR's persistently <60 mL/min signify possible Chronic Kidney Disease.    Anion gap 10 5 - 15    Comment: Performed at [facility], [place] [address] [place], [place] [address]  I-stat troponin, ED     Status: None   Collection Time: 07/11/17 12:36 AM  Result Value Ref Range   Troponin i, poc 0.02 0.00 - 0.08 ng/mL   Comment 3            Comment: Due to the release kinetics of cTnI, a negative result within the first hours of the onset of symptoms does not rule out myocardial infarction with certainty. If myocardial infarction is still suspected, repeat the test at appropriate intervals.   I-Stat Chem 8, ED     Status: Abnormal   Collection Time: 07/11/17 12:39 AM  Result Value Ref Range   Sodium 138 135 - 145 mmol/L   Potassium 4.4 3.5 - 5.1 mmol/L   Chloride 98 (L) 101 - 111 mmol/L   [name] 26 (H) 6 - 20 mg/dL   Creatinine, Ser 1.60 (H) 0.61 - 1.24 mg/dL   Glucose, Bld 125 (H) 65 - 99 mg/dL    Calcium, Ion 1.22 1.15 - 1.40 mmol/L   TCO2 31 22 - 32 mmol/L   Hemoglobin 6.8 (LL) 13.0 - 17.0 g/dL   HCT 20.0 (L) 39.0 - 52.0 %  Comment NOTIFIED PHYSICIAN    Dg Chest 2 View  Result Date: 07/11/2017 CLINICAL DATA:  Shortness of breath.  History of lung cancer. EXAM: CHEST - 2 VIEW COMPARISON:  Chest 07/05/2017, CT chest 06/30/2017 FINDINGS: Heart size and pulmonary vascularity are normal. Focal scarring in the left suprahilar and left upper lung region corresponding to lesion better seen at previous CT chest. Small bilateral pleural effusions demonstrating mild increased since previous study. There is developing right perihilar and basilar infiltration which could represent asymmetrical edema or pneumonia. No pneumothorax. Power port type central venous catheter with tip over the cavoatrial junction region. IMPRESSION: Small but increasing bilateral pleural effusions. Increasing infiltration or edema in the right perihilar and basilar lung regions. Focal scarring in the left upper lung is identified, corresponding to changes better seen on previous CT scan. Electronically Signed   By: Lucienne Capers M.D.   On: 07/11/2017 00:30    Pending Labs Unresulted Labs (From admission, onward)   Start     Ordered   07/11/17 0500  CBC  Tomorrow morning,   R     07/11/17 0304   07/11/17 9675  Basic metabolic panel  Tomorrow morning,   R     07/11/17 0304   07/11/17 0306  C-reactive protein  Add-on,   R     07/11/17 0305   07/11/17 0304  Type and screen [place]  Once,   R    Comments:  [place]    07/11/17 0304      Vitals/Pain Today's Vitals   07/10/17 2316 07/11/17 0127  BP:  136/86  Pulse:  80  Resp:  (!) 21  Temp:  97.6 F (36.4 C)  TempSrc:  Oral  SpO2:  100%  PainSc: 0-No pain 0-No pain    Isolation Precautions No active isolations  Medications Medications  apixaban (ELIQUIS) tablet 5 mg (has no administration in time range)   feeding supplement (ENSURE ENLIVE) (ENSURE ENLIVE) liquid 237 mL (has no administration in time range)  escitalopram (LEXAPRO) tablet 5 mg (has no administration in time range)  guaiFENesin (MUCINEX) 12 hr tablet 1,200 mg (has no administration in time range)  fluconazole (DIFLUCAN) tablet 100 mg (has no administration in time range)  loratadine (CLARITIN) tablet 10 mg (has no administration in time range)  metoprolol tartrate (LOPRESSOR) tablet 12.5 mg (has no administration in time range)  oxyCODONE (Oxy IR/ROXICODONE) immediate release tablet 10 mg (has no administration in time range)  pantoprazole (PROTONIX) EC tablet 40 mg (has no administration in time range)  senna (SENOKOT) tablet 8.6 mg (has no administration in time range)  ipratropium-albuterol (DUONEB) 0.5-2.5 (3) MG/3ML nebulizer solution 3 mL (has no administration in time range)  Fluticasone-Umeclidin-Vilant 100-62.5-25 MCG/INH AEPB 1 puff (has no administration in time range)  furosemide (LASIX) tablet 20 mg (has no administration in time range)  ondansetron (ZOFRAN) tablet 4 mg (has no administration in time range)    Or  ondansetron (ZOFRAN) injection 4 mg (has no administration in time range)  acetaminophen (TYLENOL) tablet 650 mg (has no administration in time range)    Or  acetaminophen (TYLENOL) suppository 650 mg (has no administration in time range)  albuterol (PROVENTIL) (2.5 MG/3ML) 0.083% nebulizer solution 5 mg (5 mg Nebulization Given 07/10/17 2341)    Mobility Walks w device

## 2017-07-11 NOTE — ED Provider Notes (Signed)
[facility] DEPT Provider Note   CSN: [id number] Arrival date & time: 07/10/17  2311     History   Chief Complaint Chief Complaint  Patient presents with  . Shortness of Breath    HPI [name] is a 57 y.o. male.  57 yo M with a cc of sob.  Ongoing issue, patient with terminal lung ca, on 2.5L of O2 at home.  The patient is living in a nursing home.  He felt that his shortness of breath is gotten worse this evening and so he asked the staff to call 911.  On his walk to the EMS stretcher he did this without oxygen and felt acutely short of breath.  He had an episode today where he felt that he must of had a fever because he got sweaty.  Had some chills afterwards.  He has been getting recurrent thoracentesis for right pleural effusion.  The last 1 of these that he got he felt that they did not take all the fluid off and he has had worsening pain there.  The history is provided by the patient.  Shortness of Breath  This is a new problem. The average episode lasts 2 days. The problem occurs continuously.The current episode started 2 days ago. The problem has not changed since onset.Associated symptoms include a fever and cough. Pertinent negatives include no headaches, no chest pain, no vomiting, no abdominal pain and no rash. He has tried nothing for the symptoms. The treatment provided no relief. He has had prior hospitalizations. He has had prior ED visits. Associated medical issues include chronic lung disease.    Past Medical History:  Diagnosis Date  . Abdominal pain 06/04/2016  . Adenocarcinoma of left lung, stage 4 ([place]) 05/02/2016  . Alcohol abuse   . Bronchitis due to tobacco use ([place])   . Cancer ([place])    Lung  . COPD (chronic obstructive pulmonary disease) ([place])   . Dehydration 06/04/2016  . Encounter for antineoplastic chemotherapy 05/02/2016  . Gastritis   . Goals of care, counseling/discussion 05/02/2016  . Hematemesis   . HTN  (hypertension) 10/30/2016  . Recurrent upper respiratory infection (URI)   . Seizures ([place]) 05/2011   new onset  . Shortness of breath     Patient Active Problem List   Diagnosis Date Noted  . PE (pulmonary thromboembolism) ([place]) 06/30/2017  . Pulmonary embolism ([place]) 06/30/2017  . AKI (acute kidney injury) ([place]) 06/25/2017  . Dyspnea 06/21/2017  . Chronic combined systolic (congestive) and diastolic (congestive) heart failure ([place])   . Constipation 06/07/2017  . Pain   . Acute on chronic respiratory failure with hypoxia ([place]) 05/28/2017  . Chronic respiratory failure with hypoxia ([place]) 04/01/2017  . Leucocytosis 03/16/2017  . CKD (chronic kidney disease), stage III ([place]) 03/12/2017  . Malnutrition of moderate degree 03/10/2017  . COPD exacerbation ([place]) 03/09/2017  . Anemia 02/07/2017  . Severe malnutrition ([place]) 01/03/2017  . DOE (dyspnea on exertion) 12/30/2016  . Nausea   . Hypervolemia   . HCAP (healthcare-associated pneumonia) 12/19/2016  . SOB (shortness of breath) 12/19/2016  . Erosive gastropathy 12/18/2016  . Gastritis 12/18/2016  . Hematemesis 12/16/2016  . Intractable nausea and vomiting 12/16/2016  . HTN (hypertension) 10/30/2016  . Chronic obstructive pulmonary disease ([place]) 09/02/2016  . COPD (chronic obstructive pulmonary disease) ([place]) 08/19/2016  . Dehydration 06/04/2016  . Abdominal pain 06/04/2016  . 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 Adenocarcinoma of left lung, stage 4 ([place]) 05/02/2016  .  Goals of care, counseling/discussion 05/02/2016  . Encounter for antineoplastic chemotherapy 05/02/2016  . Tobacco abuse 05/30/2011  . Alcohol abuse 05/30/2011  . Seizure ([place]) 05/28/2011    Past Surgical History:  Procedure Laterality Date  . ESOPHAGOGASTRODUODENOSCOPY (EGD) WITH PROPOFOL N/A 12/17/2016   Procedure: ESOPHAGOGASTRODUODENOSCOPY (EGD) WITH PROPOFOL;  Surgeon: Ladene Artist, MD;  Location: [facility] ENDOSCOPY;  Service: Endoscopy;  Laterality: N/A;    . IR FLUORO GUIDE PORT INSERTION RIGHT  05/13/2016  . IR US GUIDE VASC ACCESS RIGHT  05/13/2016  . NO PAST SURGERIES          Home Medications    Prior to Admission medications   Medication Sig Start Date End Date Taking? Authorizing Provider  acetaminophen (TYLENOL) 500 MG tablet Take 1 tablet (500 mg total) by mouth 2 (two) times daily as needed for mild pain. 07/07/17  Yes Florencia Reasons, MD  albuterol (PROVENTIL) (2.5 MG/3ML) 0.083% nebulizer solution 1 neb every 4-6 hours as needed for wheezing and shortness of breath Patient taking differently: Take 2.5 mg by nebulization every 4 (four) hours as needed for wheezing or shortness of breath.  04/01/17  Yes Parrett, Fonnie Mu, NP  apixaban (ELIQUIS) 5 MG TABS tablet Take 1 tablet (5 mg total) by mouth 2 (two) times daily. 07/08/17  Yes Florencia Reasons, MD  Chlorphen-Phenyleph-ASA (ALKA-SELTZER PLUS COLD PO) Take 2 tablets by mouth at bedtime as needed (COUGH).   Yes [provider]  dexamethasone (DECADRON) 4 MG tablet Take 4 mg by mouth 2 (two) times daily. The day before, the day of, and the day after chemotherapy every three weeks   Yes [provider]  Dextromethorphan-guaiFENesin (TUSSIN DM) 10-100 MG/5ML liquid Take 5 mLs by mouth every 12 (twelve) hours.   Yes [provider]  escitalopram (LEXAPRO) 5 MG tablet Take 1 tablet (5 mg total) by mouth at bedtime. 04/17/17  Yes Rosita Fire, MD  feeding supplement, ENSURE ENLIVE, (ENSURE ENLIVE) LIQD Take 237 mLs by mouth 3 (three) times daily between meals. 03/19/17  Yes Shelly Coss, MD  fluconazole (DIFLUCAN) 100 MG tablet Take 100 mg by mouth daily.   Yes [provider]  Fluticasone-Umeclidin-Vilant (TRELEGY ELLIPTA) 100-62.5-25 MCG/INH AEPB Inhale 1 puff into the lungs daily. 05/31/17  Yes Aline August, MD  furosemide (LASIX) 20 MG tablet Take 1 tablet (20 mg total) by mouth daily. 06/10/17  Yes Eugenie Filler, MD  guaiFENesin (MUCINEX) 600 MG 12 hr  tablet Take 1,200 mg by mouth 2 (two) times daily.    Yes [provider]  Ipratropium-Albuterol (COMBIVENT) 20-100 MCG/ACT AERS respimat Inhale 1 puff into the lungs every 6 (six) hours. Patient taking differently: Inhale 1 puff into the lungs every 6 (six) hours as needed for wheezing or shortness of breath.  05/31/17  Yes Aline August, MD  loratadine (CLARITIN) 10 MG tablet Take 10 mg by mouth daily.   Yes [provider]  metoprolol tartrate (LOPRESSOR) 25 MG tablet Take 12.5 mg by mouth 2 (two) times daily.    Yes [provider]  Multiple Vitamins-Minerals (MULTIVITAMIN WITH MINERALS) tablet Take 1 tablet by mouth daily.   Yes [provider]  ondansetron (ZOFRAN-ODT) 4 MG disintegrating tablet Take 4 mg by mouth every 8 (eight) hours as needed for nausea or vomiting.   Yes [provider]  Oxycodone HCl 10 MG TABS Take 10 mg by mouth every 4 (four) hours as needed (pain/shortness of breath).    Yes [provider]  pantoprazole (PROTONIX) 40 MG tablet Take 1 tablet (40 mg total) by mouth 2 (two) times daily. 12/18/16  Yes Eugenie Filler, MD  senna (SENOKOT) 8.6 MG TABS tablet Take 1 tablet by mouth 2 (two) times daily.   Yes [provider]  apixaban (ELIQUIS) 5 MG TABS tablet Take 2 tablets (10 mg total) by mouth 2 (two) times daily for 5 days. Patient not taking: Reported on 07/11/2017 07/03/17 07/08/17  Florencia Reasons, MD    Family History Family History  Problem Relation Age of Onset  . Cancer Father   . Diabetes Mellitus II Mother     Social History Social History   Tobacco Use  . Smoking status: Current Some Day Smoker    Packs/day: 0.10    Years: 30.00    Pack years: 3.00    Types: Cigarettes  . Smokeless tobacco: Never Used  . Tobacco comment: 1-2 cig a day   Substance Use Topics  . Alcohol use: No    Frequency: Never  . Drug use: No     Allergies   Patient has no known allergies.   Review of  Systems Review of Systems  Constitutional: Positive for chills and fever.  HENT: Negative for congestion and facial swelling.   Eyes: Negative for discharge and visual disturbance.  Respiratory: Positive for cough and shortness of breath.   Cardiovascular: Negative for chest pain and palpitations.  Gastrointestinal: Negative for abdominal pain, diarrhea and vomiting.  Musculoskeletal: Negative for arthralgias and myalgias.  Skin: Negative for color change and rash.  Neurological: Negative for tremors, syncope and headaches.  Psychiatric/Behavioral: Negative for confusion and dysphoric mood.     Physical Exam Updated Vital Signs BP 136/86 (BP Location: Left Arm)   Pulse 80   Temp 97.6 F (36.4 C) (Oral)   Resp (!) 21   SpO2 100%   Physical Exam  Constitutional: He is oriented to person, place, and time. He appears well-developed and well-nourished.  HENT:  Head: Normocephalic and atraumatic.  Eyes: Pupils are equal, round, and reactive to light. EOM are normal.  Neck: Normal range of motion. Neck supple. No JVD present.  Cardiovascular: Normal rate and regular rhythm. Exam reveals no gallop and no friction rub.  No murmur heard. Pulmonary/Chest: No respiratory distress. He has no wheezes.  Diminished breath sounds to the RLL, dull to percussion  Abdominal: He exhibits no distension. There is no rebound and no guarding.  Musculoskeletal: Normal range of motion.  Neurological: He is alert and oriented to person, place, and time.  Skin: No rash noted. No pallor.  Psychiatric: He has a normal mood and affect. His behavior is normal.  Nursing note and vitals reviewed.    ED Treatments / Results  Labs (all labs ordered are listed, but only abnormal results are displayed) Labs Reviewed  CBC WITH DIFFERENTIAL/PLATELET - Abnormal; Notable for the following components:      Result Value   WBC 12.2 (*)    RBC 2.31 (*)    Hemoglobin 7.7 (*)    HCT 24.0 (*)    MCV 103.9 (*)     RDW 18.2 (*)    Neutro Abs 10.7 (*)    All other components within normal limits  BASIC METABOLIC PANEL - Abnormal; Notable for the following components:   Chloride 100 (*)    CO2 33 (*)    Glucose, Bld 127 (*)    BUN 28 (*)    Creatinine, Ser 1.66 (*)    GFR  calc non Af Amer 44 (*)    GFR calc Af Amer 51 (*)    All other components within normal limits  I-STAT CHEM 8, ED - Abnormal; Notable for the following components:   Chloride 98 (*)    BUN 26 (*)    Creatinine, Ser 1.60 (*)    Glucose, Bld 125 (*)    Hemoglobin 6.8 (*)    HCT 20.0 (*)    All other components within normal limits  I-STAT TROPONIN, ED    EKG EKG Interpretation  Date/Time:  Thursday July 10 2017 23:38:32 EDT Ventricular Rate:  77 PR Interval:    QRS Duration: 85 QT Interval:  442 QTC Calculation: 501 R Axis:   101 Text Interpretation:  Sinus rhythm Probable lateral infarct, old Prolonged QT interval Baseline wander deep inverted t waves resolved Otherwise no significant change Confirmed by Deno Etienne [id number]) on 07/10/2017 11:43:53 PM   Radiology Dg Chest 2 View  Result Date: 07/11/2017 CLINICAL DATA:  Shortness of breath.  History of lung cancer. EXAM: CHEST - 2 VIEW COMPARISON:  Chest 07/05/2017, CT chest 06/30/2017 FINDINGS: Heart size and pulmonary vascularity are normal. Focal scarring in the left suprahilar and left upper lung region corresponding to lesion better seen at previous CT chest. Small bilateral pleural effusions demonstrating mild increased since previous study. There is developing right perihilar and basilar infiltration which could represent asymmetrical edema or pneumonia. No pneumothorax. Power port type central venous catheter with tip over the cavoatrial junction region. IMPRESSION: Small but increasing bilateral pleural effusions. Increasing infiltration or edema in the right perihilar and basilar lung regions. Focal scarring in the left upper lung is identified, corresponding to  changes better seen on previous CT scan. Electronically Signed   By: Lucienne Capers M.D.   On: 07/11/2017 00:30    Procedures Procedures (including critical care time)  Medications Ordered in ED Medications  albuterol (PROVENTIL) (2.5 MG/3ML) 0.083% nebulizer solution 5 mg (5 mg Nebulization Given 07/10/17 2341)     Initial Impression / Assessment and Plan / ED Course  I have reviewed the triage vital signs and the nursing notes.  Pertinent labs & imaging results that were available during my care of the patient were reviewed by me and considered in my medical decision making (see chart for details).     57 yo M with a significant past medical history of lung cancer on 2 L of oxygen at all times at home comes in with a chief complaint of shortness of breath.  The patient is satting well on 2 L on my exam.  He is mildly tachypneic.  Chest x-ray with a recurrent right pleural effusion.  The patient's right-sided pleural effusion is increased post his thoracentesis.  Hemoglobin on i-STAT is 6.8 but the lab value was 7.7 which is his baseline.  He has a mild increase of his BUN and creatinine.  Bicarb is 33 which is slightly below his baseline.  Troponin is negative.  On reassessment the patient feels that he is still acutely short of breath and needs to have the fluid drained off.  He is mildly tachypneic, he is satting well on his 2 L of oxygen.  Will discuss with the hospitalist.  The patients results and plan were reviewed and discussed.   Any x-rays performed were independently reviewed by myself.   Differential diagnosis were considered with the presenting HPI.  Medications  albuterol (PROVENTIL) (2.5 MG/3ML) 0.083% nebulizer solution 5 mg (5 mg Nebulization Given  07/10/17 2341)    Vitals:   07/11/17 0127  BP: 136/86  Pulse: 80  Resp: (!) 21  Temp: 97.6 F (36.4 C)  TempSrc: Oral  SpO2: 100%    Final diagnoses:  SOB (shortness of breath)  Pleural effusion  Anemia,  chronic disease    Admission/ observation were discussed with the admitting physician, patient and/or family and they are comfortable with the plan.    Final Clinical Impressions(s) / ED Diagnoses   Final diagnoses:  SOB (shortness of breath)  Pleural effusion  Anemia, chronic disease    ED Discharge Orders    None       Deno Etienne, DO 07/11/17 0228

## 2017-07-11 NOTE — Progress Notes (Signed)
cxr 

## 2017-07-11 NOTE — ED Notes (Signed)
Jon, RN notified of abnormal I-stat Chem 8 result, hemoglobin of 6.8 @ 0042. Will notify MD.

## 2017-07-11 NOTE — Progress Notes (Signed)
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 Pt presented to ED with SOB. Pt has been admitted recently for the same issue. Pt currently receiving palliative chemotherapy.  Spoke with pt at bedside who reports eating well "at the nursing home." Pt reports eating 3 meals daily. Pt also reports drinking 1-2 Ensures daily but states, "I can have as many as I want."  Pt reports that he currently has a good appetite. Per discussion with RN, pt was very hungry for breakfast this morning. Pt states that he had bacon and eggs and "ate all of it." Pt agreeable to receiving Ensure Enlive during current hospitalization. Pt refuses other supplements like Magic Cup and [employer] stating, "I won't drink that."  Pt reports his UBW as 140 lbs. Pt unsure when he last weighed this. Pt reports his lowest recent weight as 115 lbs and endorses "gaining a little weight back." Per weight history in  chart, pt has lost 20 lbs since 12/18/16. This is a 14.2% weight loss in 7 months which is significant for timeframe.  Medications reviewed and include: Ensure Enlive TID, 20 mg Lasix daily, 40 mg Protonix BID, 40 mg Prednisone daily, Senokot daily, IV antibiotics  Labs reviewed: chloride 100 (L), CO2 33 (H), BUN 26 (H), creatinine 1.69 (H), hemoglobin 7.8 (L), HCT 24.6 (L)  NUTRITION - FOCUSED PHYSICAL EXAM:    Most Recent Value  Orbital Region  No depletion  Upper Arm Region  Moderate depletion  Thoracic and Lumbar Region  Moderate depletion  Buccal Region  Mild depletion  Temple Region  Mild depletion  Clavicle Bone Region  Moderate depletion  Clavicle and Acromion Bone Region  Moderate depletion  Scapular Bone Region  Unable to assess  Dorsal Hand  Mild depletion  Patellar Region  Moderate depletion  Anterior Thigh Region  Moderate depletion  Posterior Calf Region  Moderate depletion  Edema (RD Assessment)  None  Hair  Reviewed  Eyes  Reviewed  Mouth  Reviewed  Skin  Reviewed  Nails  Reviewed       Diet Order:   Diet Order           Diet regular Room service appropriate? Yes; Fluid consistency: Thin  Diet effective now          EDUCATION NEEDS:   No education needs have been identified at this time  Skin:  Skin Assessment: Reviewed RN Assessment  Last BM:  07/10/17  Height:   Ht Readings from Last 1 Encounters:  07/11/17 5\' 11"  (1.803 m)  Weight:   Wt Readings from Last 1 Encounters:  07/11/17 121 lb 3.2 oz (55 kg)    Ideal Body Weight:  78.18 kg  BMI:  Body mass index is 16.9 kg/m.  Estimated Nutritional Needs:   Kcal:  1925-2145 kcal/day (35-39 kcal/kg)  Protein:  77-88 grams/day (1.4-1.6 grams/kg)  Fluid:  >/= 1.9 L/day    Gaynell Face, MS, RD, LDN Pager: [phone] Weekend/After Hours: [phone]

## 2017-07-11 NOTE — Progress Notes (Signed)
Patient ID: GLOYD HAPP, male   DOB: 1960/03/27, 57 y.o.   MRN: [id number] Pt had small rt basilar ptx noted on f/u CXR after right thoracentesis; ? ex vacuole; he had some chest discomfort near end of procedure once majority of fluid was aspirated; his symptoms have now improved. Continue to monitor for any worsening dyspnea, CP and obtain CXR if occurs. Will plan for f/u film in am regardless. Above d/w pt's nurse and pt told to inform nurse with any changes.

## 2017-07-11 NOTE — Discharge Instructions (Signed)

## 2017-07-11 NOTE — Progress Notes (Signed)
Pharmacy - Eliquis  Assessment: 25 yoM with PMH lung CA, HTN, CHF, COPD, and recent PE (06/30/2017) admitted for exacerbation of chronic abdominal pain. Patient is on Eliquis 5 mg PO bid which was held on admission d/t concern for GI bleeding. No further bleeding noted in the hospital and H&H low but stable. Pharmacy asked to resume Eliquis.  Plan:  Resume Eliquis 5 mg PO bid  Pharmacy will sign off, following peripherally for renal adjustments or CBC changes.  [name], PharmD, BCPS [phone] 07/11/2017, 2:52 PM

## 2017-07-11 NOTE — H&P (Signed)
History and Physical    [name]:[id number] DOB: [birth year] DOA: 07/10/2017  Referring MD/NP/PA: Deno Etienne, MD PCP: Johnathan Joe, MD  Patient coming from: Nursing facility via EMS  Chief Complaint: Shortness of breath  I have personally briefly reviewed patient's old medical records in [place]   HPI: [name] is a 57 y.o. 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  Patient had reported slow progression of symptoms over the last few days with acute worsening yesterday evening.  He complained of breaking out in a sweat, felt as though he had a fever, and subsequently had chills thereafter.  He feels that the fluid has reaccumulated in his lung.  The patient has had 4 separate hospitalizations this month alone with complaints of shortness of breath thought to be a combination of patient's lung cancer, COPD, and recurrent pleural effusion. During his last hospitalization patient had thoracentesis on 6/15, where 1.2 L of fluid were removed.  Patient was supposed to follow-up with thoracic surgery on June 24 for evaluation for Pleurx catheter placement.  Associated symptoms include complaints of continued cough, wheezing, nausea, swelling all over, new reports of possible blood present in toilet bowl during his last bowel movement, and pain.  He notes having pain in his stomach, head, back, and chest.   ED Course: Upon admission to the emergency department patient was noted to have vital signs relatively within normal limits.  Lab work revealed BC 12.2, hemoglobin 7.7, BUN 28, and creatinine 1.66.  X-ray imaging showed small but increasing bilateral pleural effusions with increased edema or infiltration in the right perihilar region.  Patient was given  albuterol breathing treatment and TRH called to admit.  Review of Systems  Constitutional: Positive for chills, fever and malaise/fatigue.  HENT: Negative for ear discharge and nosebleeds.   Eyes: Negative for photophobia and pain.  Respiratory: Positive for cough, shortness of breath and wheezing.   Cardiovascular: Positive for chest pain and leg swelling.  Gastrointestinal: Positive for abdominal pain, blood in stool and nausea. Negative for vomiting.  Genitourinary: Negative for dysuria and hematuria.  Musculoskeletal: Positive for back pain and myalgias.  Skin: Negative for itching.  Neurological: Negative for focal weakness and loss of consciousness.  Psychiatric/Behavioral: Negative for hallucinations, memory loss and suicidal ideas.    Past Medical History:  Diagnosis Date  . Abdominal pain 06/04/2016  . Adenocarcinoma of left lung, stage 4 ([place]) 05/02/2016  . Alcohol abuse   . Bronchitis due to tobacco use ([place])   . Cancer ([place])    Lung  . COPD (chronic obstructive pulmonary disease) ([place])   . Dehydration 06/04/2016  . Encounter for antineoplastic chemotherapy 05/02/2016  . Gastritis   . Goals of care, counseling/discussion 05/02/2016  . Hematemesis   . HTN (hypertension) 10/30/2016  . Recurrent upper respiratory infection (URI)   . Seizures ([place]) 05/2011   new onset  . Shortness of breath     Past Surgical History:  Procedure Laterality Date  . ESOPHAGOGASTRODUODENOSCOPY (EGD) WITH PROPOFOL N/A 12/17/2016   Procedure: ESOPHAGOGASTRODUODENOSCOPY (EGD) WITH PROPOFOL;  Surgeon: Ladene Artist, MD;  Location: [facility];  Service: Endoscopy;  Laterality: N/A;  . IR FLUORO GUIDE PORT INSERTION RIGHT  05/13/2016  . IR US GUIDE VASC ACCESS RIGHT  05/13/2016  . NO PAST SURGERIES       reports that he has been smoking cigarettes.  He has a 3.00 pack-year smoking history. He has never used smokeless tobacco. He reports that he does not drink alcohol or use drugs.  No  Known Allergies  Family History  Problem Relation Age of Onset  . Cancer Father   . Diabetes Mellitus II Mother     Prior to Admission medications   Medication Sig Start Date End Date Taking? Authorizing Provider  acetaminophen (TYLENOL) 500 MG tablet Take 1 tablet (500 mg total) by mouth 2 (two) times daily as needed for mild pain. 07/07/17  Yes Florencia Reasons, MD  albuterol (PROVENTIL) (2.5 MG/3ML) 0.083% nebulizer solution 1 neb every 4-6 hours as needed for wheezing and shortness of breath Patient taking differently: Take 2.5 mg by nebulization every 4 (four) hours as needed for wheezing or shortness of breath.  04/01/17  Yes Parrett, Fonnie Mu, NP  apixaban (ELIQUIS) 5 MG TABS tablet Take 1 tablet (5 mg total) by mouth 2 (two) times daily. 07/08/17  Yes Florencia Reasons, MD  Chlorphen-Phenyleph-ASA (ALKA-SELTZER PLUS COLD PO) Take 2 tablets by mouth at bedtime as needed (COUGH).   Yes [provider]  dexamethasone (DECADRON) 4 MG tablet Take 4 mg by mouth 2 (two) times daily. The day before, the day of, and the day after chemotherapy every three weeks   Yes [provider]  Dextromethorphan-guaiFENesin (TUSSIN DM) 10-100 MG/5ML liquid Take 5 mLs by mouth every 12 (twelve) hours.   Yes [provider]  escitalopram (LEXAPRO) 5 MG tablet Take 1 tablet (5 mg total) by mouth at bedtime. 04/17/17  Yes Rosita Fire, MD  feeding supplement, ENSURE ENLIVE, (ENSURE ENLIVE) LIQD Take 237 mLs by mouth 3 (three) times daily between meals. 03/19/17  Yes Shelly Coss, MD  fluconazole (DIFLUCAN) 100 MG tablet Take 100 mg by mouth daily.   Yes [provider]  Fluticasone-Umeclidin-Vilant (TRELEGY ELLIPTA) 100-62.5-25 MCG/INH AEPB Inhale 1 puff into the lungs daily. 05/31/17  Yes Aline August, MD  furosemide (LASIX) 20 MG tablet Take 1 tablet (20 mg total) by mouth daily. 06/10/17  Yes Eugenie Filler, MD  guaiFENesin (MUCINEX) 600 MG 12 hr tablet Take 1,200 mg by mouth 2  (two) times daily.    Yes [provider]  Ipratropium-Albuterol (COMBIVENT) 20-100 MCG/ACT AERS respimat Inhale 1 puff into the lungs every 6 (six) hours. Patient taking differently: Inhale 1 puff into the lungs every 6 (six) hours as needed for wheezing or shortness of breath.  05/31/17  Yes Aline August, MD  loratadine (CLARITIN) 10 MG tablet Take 10 mg by mouth daily.   Yes [provider]  metoprolol tartrate (LOPRESSOR) 25 MG tablet Take 12.5 mg by mouth 2 (two) times daily.    Yes [provider]  Multiple Vitamins-Minerals (MULTIVITAMIN WITH MINERALS) tablet Take 1 tablet by mouth daily.   Yes [provider]  ondansetron (ZOFRAN-ODT) 4 MG disintegrating tablet Take 4 mg by mouth every 8 (eight) hours as needed for nausea or vomiting.   Yes [provider]  Oxycodone HCl 10 MG TABS Take 10 mg by mouth every 4 (four) hours as needed (pain/shortness of breath).    Yes [provider]  pantoprazole (PROTONIX) 40 MG tablet Take 1 tablet (40 mg total) by mouth 2 (two) times daily. 12/18/16  Yes Eugenie Filler, MD  senna (SENOKOT) 8.6 MG TABS tablet Take 1 tablet by mouth  2 (two) times daily.   Yes [provider]  apixaban (ELIQUIS) 5 MG TABS tablet Take 2 tablets (10 mg total) by mouth 2 (two) times daily for 5 days. Patient not taking: Reported on 07/11/2017 07/03/17 07/08/17  Florencia Reasons, MD    Physical Exam:  Constitutional: Chronically ill-appearing male NAD, calm, comfortable Vitals:   07/11/17 0127  BP: 136/86  Pulse: 80  Resp: (!) 21  Temp: 97.6 F (36.4 C)  TempSrc: Oral  SpO2: 100%   Eyes: PERRL, lids and conjunctivae normal ENMT: Mucous membranes are moist. Posterior pharynx clear of any exudate or lesions. Neck: normal, supple, no masses, no thyromegaly Respiratory: clear to auscultation bilaterally, no wheezing, no crackles. Normal respiratory effort. No accessory muscle use.  Cardiovascular: Regular rate and  rhythm, no murmurs / rubs / gallops.  +1 pitting bilateral lower extremity edema. 2+ pedal pulses. No carotid bruits.  Abdomen: no tenderness, no masses palpated. No hepatosplenomegaly. Bowel sounds positive.  Musculoskeletal: no clubbing / cyanosis. No joint deformity upper and lower extremities. Good ROM, no contractures. Normal muscle tone.  Skin: no rashes, lesions, ulcers. No induration Neurologic: CN 2-12 grossly intact. Sensation intact, DTR normal. Strength 5/5 in all 4.  Psychiatric: Normal judgment and insight. Alert and oriented x 3. Normal mood.     Labs on Admission: I have personally reviewed following labs and imaging studies  CBC: Recent Labs  Lab 07/04/17 0838 07/06/17 0333 07/07/17 0403 07/09/17 0423 07/10/17 2350 07/11/17 0039  WBC 14.7* 10.3 12.2* 11.2* 12.2*  --   NEUTROABS 11.3 9.6* 9.3* 8.7* 10.7*  --   HGB 7.9* 7.5* 7.4* 7.6* 7.7* 6.8*  HCT 24.7* 23.1* 23.7* 23.4* 24.0* 20.0*  MCV 104.2* 103.6* 105.8* 104.5* 103.9*  --   PLT 194 168 157 167 178  --    Basic Metabolic Panel: Recent Labs  Lab 07/04/17 0838 07/06/17 0333 07/07/17 0403 07/09/17 0423 07/10/17 2350 07/11/17 0039  NA 140 146* 145 145 143 138  K 3.5 4.4 3.5 3.8 4.5 4.4  CL 98* 103 103 103 100* 98*  CO2 33* 37* 35* 35* 33*  --   GLUCOSE 87 96 96 98 127* 125*  BUN 23* 21* 15 16 28* 26*  CREATININE 1.56* 1.73* 1.37* 1.70* 1.66* 1.60*  CALCIUM 9.0 8.8* 8.5* 8.9 9.1  --    GFR: Estimated Creatinine Clearance: 37.9 mL/min (A) (by C-G formula based on SCr of 1.6 mg/dL (H)). Liver Function Tests: No results for input(s): AST, ALT, ALKPHOS, BILITOT, PROT, ALBUMIN in the last 168 hours. No results for input(s): LIPASE, AMYLASE in the last 168 hours. No results for input(s): AMMONIA in the last 168 hours. Coagulation Profile: No results for input(s): INR, PROTIME in the last 168 hours. Cardiac Enzymes: Recent Labs  Lab 07/04/17 0838  TROPONINI 0.08*   BNP (last 3 results) No results  for input(s): PROBNP in the last 8760 hours. HbA1C: No results for input(s): HGBA1C in the last 72 hours. CBG: No results for input(s): GLUCAP in the last 168 hours. Lipid Profile: No results for input(s): CHOL, HDL, LDLCALC, TRIG, CHOLHDL, LDLDIRECT in the last 72 hours. Thyroid Function Tests: No results for input(s): TSH, T4TOTAL, FREET4, T3FREE, THYROIDAB in the last 72 hours. Anemia Panel: No results for input(s): VITAMINB12, FOLATE, FERRITIN, TIBC, IRON, RETICCTPCT in the last 72 hours. Urine analysis:    Component Value Date/Time   COLORURINE YELLOW 05/20/2017 0940   APPEARANCEUR CLEAR 05/20/2017 0940   LABSPEC 1.015 05/20/2017 0940  PHURINE 5.0 05/20/2017 0940   GLUCOSEU NEGATIVE 05/20/2017 0940   HGBUR NEGATIVE 05/20/2017 0940   BILIRUBINUR NEGATIVE 05/20/2017 0940   KETONESUR 5 (A) 05/20/2017 0940   PROTEINUR 30 (A) 05/20/2017 0940   UROBILINOGEN 0.2 05/29/2011 0122   NITRITE NEGATIVE 05/20/2017 0940   LEUKOCYTESUR NEGATIVE 05/20/2017 0940   Sepsis Labs: Recent Results (from the past 240 hour(s))  Fungus Culture With Stain     Status: None (Preliminary result)   Collection Time: 07/05/17  1:36 PM  Result Value Ref Range Status   Fungus Stain Final report  Final    Comment: (NOTE) Performed At: [facility] [place], [place] [id number] Rush Farmer MD RS:[id number]    Fungus (Mycology) Culture PENDING  Incomplete   Fungal Source PLEURAL  Final    Comment: RIGHT Performed at [facility], [place] [address], [place], [place] [address]   Fungus Culture Result     Status: None   Collection Time: 07/05/17  1:36 PM  Result Value Ref Range Status   Result 1 Comment  Final    Comment: (NOTE) KOH/Calcofluor preparation:  no fungus observed. Performed At: [facility] [place], [place] [id number] Rush Farmer MD CV:[id number] Performed at [facility], [place] [address] [place], [place] [address]      Radiological Exams on Admission: Dg Chest 2 View  Result Date: 07/11/2017 CLINICAL DATA:  Shortness of breath.  History of lung cancer. EXAM: CHEST - 2 VIEW COMPARISON:  Chest 07/05/2017, CT chest 06/30/2017 FINDINGS: Heart size and pulmonary vascularity are normal. Focal scarring in the left suprahilar and left upper lung region corresponding to lesion better seen at previous CT chest. Small bilateral pleural effusions demonstrating mild increased since previous study. There is developing right perihilar and basilar infiltration which could represent asymmetrical edema or pneumonia. No pneumothorax. Power port type central venous catheter with tip over the cavoatrial junction region. IMPRESSION: Small but increasing bilateral pleural effusions. Increasing infiltration or edema in the right perihilar and basilar lung regions. Focal scarring in the left upper lung is identified, corresponding to changes better seen on previous CT scan. Electronically Signed   By: Lucienne Capers M.D.   On: 07/11/2017 00:30    EKG: Independently reviewed.  Sinus rhythm at 72 bpm with prolonged QT 501   Assessment/Plan Acute on chronic respiratory failure, recurrent malignant pleural effusions, COPD  exacerbation: Patient presents with progressively worsening shortness of breath.  Patient found to have mild wheezing and with decreased aeration noted at the lung bases on physical exam.  Chest x-ray showing worsening signs of bilateral pleural effusions.   Patient's O2 sats stable on home oxygen requirements.  Patient with scheduled appointment to see thoracic surgery regarding Pleurx catheter on June 24. - Admit to a telemetry bed - Continuous pulse oximetry overnight with nasal cannula oxygen continuously - IR consult for need of thoracentesis - May want to consult thoracic surgery for Pleurx catheter placement as patient coming in with recurrent symptoms prior to appointment. -  Prednisone - Continue Ellipta and budesonide nebs - Breathing treatments 4 times daily and as needed for shortness of breath/wheezing  Adenocarcinoma of the left lung stage IV: Patient previously diagnosed in 02/2016 and completed radiation treatment for spinal metastases in 05/2016.  Patient currently receiving palliative chemotherapy with oncology.  - Continue home pain regimen - Will need to notify oncology in a.m. of patient's admission  Leukocytosis: Upon review of records it appears patient's  white blood cell count has intermittently been elevated over the last 2 weeks.  Vital signs are otherwise noted to be stable.  Review of records shows negative blood cultures from 6/1 and fungal cultures negative from hospitalization on 6/15.  However, given immunocompromised status question underlying infection with reports of subjective fever, chills, and diaphoresis. - SIRS/sepsis order set initiated - Check lactic acid, procalcitonin, CRP - Follow-up blood cultures - Given empiric antibiotics of vancomycin and Zosyn.  De-escalate when medically appropriate  Macrocytic anemia: Acute on chronic.  Patient's blood counts have been around 7 over the last week, but appears stable.  Patient reports possibly seeing blood in stool and had recently just been placed on Eliquis for pulmonary embolus. - Type and screen  - Check stool guaiac - Continue to monitor and transfuse blood products as needed   Essential hypertension - Continue metoprolol    History of pulmonary embolus on Eliquis: Patient was recently diagnosed with pulmonary embolism on 6/10.  Patient reports taking Eliquis as prescribed. - Will need to continue Eliquis, if medically appropriate based off patient's clinical picture  Diastolic CHF: chronic. Patient with 1+ pitting lower extremity edema noted.  Patient's last echocardiogram EF 55 to 60% with grade 1 diastolic dysfunction on 04/29/8117. - Strict I&O's and daily weights - Check BNP -  continue lasix  Chronic kidney disease stage III: Review of record shows patient creatinine has been fluctuating. - Continue to monitor  Tobacco abuse  DVT prophylaxis: TBD Code Status: DNR Family Communication: No family present Disposition Plan: Likely discharge home once medically stable Consults called: NONE Admission status: Observation  Norval Morton MD [facility] Pager [phone]   If 7PM-7AM, please contact night-coverage [email or web address] Password [email or web address]  07/11/2017, 2:09 AM

## 2017-07-11 NOTE — Progress Notes (Signed)
CRITICAL VALUE ALERT  Critical Value:  Lactic acid 2.7  Date & Time Notied:  6/21 0700  Provider Notified: Fuller Plan  Orders Received/Actions taken: IV abx, blood cultures, continue to monitor.

## 2017-07-11 NOTE — Procedures (Signed)
Ultrasound-guided diagnostic and therapeutic right thoracentesis performed yielding 680 cc of turbid, bloody fluid. No immediate complications. Follow-up chest x-ray pending.The fluid was sent to the lab for preordered studies.

## 2017-07-11 NOTE — Progress Notes (Signed)
Pharmacy Antibiotic Note  [name] is a 57 y.o. male admitted on 07/10/2017 with sepsis.  Pharmacy has been consulted for Vancomycin and Zosyn dosing.  Plan: Zosyn 3.375g IV q8h (4 hour infusion).  Vancomycin 1gm iv x1, then 750mg  iv q36hr  Goal AUC = 400 - 500 for all indications, except meningitis (goal AUC > 500 and Cmin 15-20 mcg/mL)   Temp (24hrs), Avg:97.7 F (36.5 C), Min:97.6 F (36.4 C), Max:97.8 F (36.6 C)  Recent Labs  Lab 07/06/17 0333 07/07/17 0403 07/09/17 0423 07/10/17 2350 07/11/17 0039 07/11/17 0450  WBC 10.3 12.2* 11.2* 12.2*  --  13.3*  CREATININE 1.73* 1.37* 1.70* 1.66* 1.60* 1.69*    Estimated Creatinine Clearance: 35.9 mL/min (A) (by C-G formula based on SCr of 1.69 mg/dL (H)).    No Known Allergies  Antimicrobials this admission: Vancomycin 07/11/2017 >> Zosyn 07/11/2017 >>   Dose adjustments this admission: -  Microbiology results: -  Thank you for allowing pharmacy to be a part of this patient's care.  [name] 07/11/2017 6:00 AM

## 2017-07-12 ENCOUNTER — Observation Stay (HOSPITAL_COMMUNITY): Payer: Medicaid Other

## 2017-07-12 DIAGNOSIS — J90 Pleural effusion, not elsewhere classified: Secondary | ICD-10-CM

## 2017-07-12 LAB — CBC WITH DIFFERENTIAL/PLATELET
Basophils Absolute: 0 10*3/uL (ref 0.0–0.1)
Basophils Relative: 0 %
EOS ABS: 0.1 10*3/uL (ref 0.0–0.7)
Eosinophils Relative: 1 %
HEMATOCRIT: 22.5 % — AB (ref 39.0–52.0)
HEMOGLOBIN: 7.2 g/dL — AB (ref 13.0–17.0)
LYMPHS ABS: 1.3 10*3/uL (ref 0.7–4.0)
Lymphocytes Relative: 13 %
MCH: 33.8 pg (ref 26.0–34.0)
MCHC: 32 g/dL (ref 30.0–36.0)
MCV: 105.6 fL — ABNORMAL HIGH (ref 78.0–100.0)
MONOS PCT: 10 %
Monocytes Absolute: 1 10*3/uL (ref 0.1–1.0)
NEUTROS PCT: 76 %
Neutro Abs: 7.8 10*3/uL — ABNORMAL HIGH (ref 1.7–7.7)
Platelets: 168 10*3/uL (ref 150–400)
RBC: 2.13 MIL/uL — AB (ref 4.22–5.81)
RDW: 18.8 % — ABNORMAL HIGH (ref 11.5–15.5)
WBC: 10.2 10*3/uL (ref 4.0–10.5)

## 2017-07-12 LAB — CBC
HEMATOCRIT: 26.6 % — AB (ref 39.0–52.0)
HEMOGLOBIN: 8.6 g/dL — AB (ref 13.0–17.0)
MCH: 32.8 pg (ref 26.0–34.0)
MCHC: 32.3 g/dL (ref 30.0–36.0)
MCV: 101.5 fL — ABNORMAL HIGH (ref 78.0–100.0)
Platelets: 155 10*3/uL (ref 150–400)
RBC: 2.62 MIL/uL — AB (ref 4.22–5.81)
RDW: 18.7 % — ABNORMAL HIGH (ref 11.5–15.5)
WBC: 13.3 10*3/uL — ABNORMAL HIGH (ref 4.0–10.5)

## 2017-07-12 LAB — LACTATE DEHYDROGENASE: LDH: 175 U/L (ref 98–192)

## 2017-07-12 LAB — BASIC METABOLIC PANEL
Anion gap: 7 (ref 5–15)
BUN: 19 mg/dL (ref 6–20)
CHLORIDE: 101 mmol/L (ref 101–111)
CO2: 35 mmol/L — AB (ref 22–32)
CREATININE: 1.82 mg/dL — AB (ref 0.61–1.24)
Calcium: 8.9 mg/dL (ref 8.9–10.3)
GFR calc non Af Amer: 40 mL/min — ABNORMAL LOW (ref >60)
GFR, EST AFRICAN AMERICAN: 46 mL/min — AB (ref >60)
Glucose, Bld: 86 mg/dL (ref 65–99)
POTASSIUM: 3.8 mmol/L (ref 3.5–5.1)
SODIUM: 143 mmol/L (ref 135–145)

## 2017-07-12 LAB — RETICULOCYTES
RBC.: 2.13 MIL/uL — AB (ref 4.22–5.81)
RETIC CT PCT: 3.1 % (ref 0.4–3.1)
Retic Count, Absolute: 66 10*3/uL (ref 19.0–186.0)

## 2017-07-12 LAB — PREPARE RBC (CROSSMATCH)

## 2017-07-12 MED ORDER — HEPARIN SOD (PORK) LOCK FLUSH 100 UNIT/ML IV SOLN
500.0000 [IU] | INTRAVENOUS | Status: AC | PRN
  Administered 2017-07-12: 500 [IU]

## 2017-07-12 MED ORDER — PREDNISONE 10 MG PO TABS: ORAL_TABLET | ORAL | 0 refills | Status: DC

## 2017-07-12 MED ORDER — GABAPENTIN 100 MG PO CAPS: 100.0000 mg | ORAL_CAPSULE | Freq: Three times a day (TID) | ORAL | 0 refills | Status: AC

## 2017-07-12 MED ORDER — FERROUS SULFATE 325 (65 FE) MG PO TABS: 325.0000 mg | ORAL_TABLET | Freq: Two times a day (BID) | ORAL | 0 refills | Status: AC

## 2017-07-12 MED ORDER — FERROUS SULFATE 325 (65 FE) MG PO TABS
325.0000 mg | ORAL_TABLET | Freq: Two times a day (BID) | ORAL | Status: DC
  Administered 2017-07-12: 325 mg via ORAL
  Filled 2017-07-12: qty 1

## 2017-07-12 MED ORDER — SENNOSIDES-DOCUSATE SODIUM 8.6-50 MG PO TABS: 2.0000 | ORAL_TABLET | Freq: Two times a day (BID) | ORAL | 0 refills | Status: AC

## 2017-07-12 MED ORDER — CYANOCOBALAMIN 1000 MCG/ML IJ SOLN
1000.0000 ug | Freq: Once | INTRAMUSCULAR | Status: DC
  Filled 2017-07-12: qty 1

## 2017-07-12 MED ORDER — PREDNISONE 20 MG PO TABS
20.0000 mg | ORAL_TABLET | Freq: Every day | ORAL | Status: DC
  Administered 2017-07-12: 20 mg via ORAL

## 2017-07-12 MED ORDER — SODIUM CHLORIDE 0.9% IV SOLUTION: Freq: Once | INTRAVENOUS | Status: DC

## 2017-07-12 MED ORDER — FOLIC ACID 1 MG PO TABS
1.0000 mg | ORAL_TABLET | Freq: Every day | ORAL | Status: DC
  Administered 2017-07-12: 1 mg via ORAL
  Filled 2017-07-12: qty 1

## 2017-07-12 MED ORDER — SUCRALFATE 1 GM/10ML PO SUSP: 1.0000 g | Freq: Three times a day (TID) | ORAL | 0 refills | Status: AC

## 2017-07-12 MED ORDER — FUROSEMIDE 40 MG PO TABS
40.0000 mg | ORAL_TABLET | Freq: Every day | ORAL | Status: DC
  Administered 2017-07-12: 40 mg via ORAL
  Filled 2017-07-12: qty 1

## 2017-07-12 MED ORDER — FUROSEMIDE 40 MG PO TABS: 40.0000 mg | ORAL_TABLET | Freq: Every day | ORAL | 0 refills | Status: DC

## 2017-07-12 MED ORDER — FOLIC ACID 1 MG PO TABS: 1.0000 mg | ORAL_TABLET | Freq: Every day | ORAL | 0 refills | Status: AC

## 2017-07-12 NOTE — Progress Notes (Signed)
Pt began vomiting large amounts of brown fluid. PRN zofran given. Pt concerned about being discharged tonight. VSS. Concerns discussed with on call provider Bodenheimer, NP. Provider ok with pt being discharged tonight.

## 2017-07-12 NOTE — Progress Notes (Addendum)
Patient from [name].   Patient to return to ALF.   LCSW faxed dc docs to facility.   Patient to transport by [facility].   RN report number: [phone]  RN will call [facility] when patient is ready to transport.   Carolin Coy [place]

## 2017-07-12 NOTE — Progress Notes (Signed)
Agree with previous RN's assessment. All night time medications given to pt. Discharge summary reviewed with pt. All questions and concerns answered. All belongings sent with pt.

## 2017-07-12 NOTE — Progress Notes (Signed)
Called report to [name].  Transportation arranged through [facility] at approximately 6:40 p.m.

## 2017-07-12 NOTE — Clinical Social Work Note (Signed)
Patient assessed on 6/13 and again on 6/17. Patient was to dc to [facility] on last admission, but refused at dc. No significant changes since last admission. Patient will return to [name]F at [facility].   Carolin Coy [place] CSW [phone]   Clinical Social Work Assessment  Patient Details  Name: [name] MRN: [id number] Date of Birth: 11-07-1960  Date of referral:                  Reason for consult:                   Permission sought to share information with:    Permission granted to share information::     Name::        Agency::     Relationship::     Contact Information:     Housing/Transportation Living arrangements for the past 2 months:    Source of Information:    Patient Interpreter Needed:    Criminal Activity/Legal Involvement Pertinent to Current Situation/Hospitalization:    Significant Relationships:    Lives with:    Do you feel safe going back to the place where you live?    Need for family participation in patient care:     Care giving concerns:  Patient is a long term care resident at [facility]. Patient reported that he is starting need assistance with bathing and dressing. Patient uses a walker at ALF. PT recommending home health PT.   Social Worker assessment / plan:  CSW spoke with patient at bedside regarding discharge planning. Patient reported that he plans to return to [name]. Patient reported that he will need [facility].  CSW spoke with [name] staff member [name], who confirmed patient's ability to return. Staff reported that they use [facility] for palliative services and that they don't set it up for patient.   Palliative NP recommended that patient be followed by palliative care at ALF. RNCM notified to follow up with agency ALF uses for palliative care.   CSW will complete FL2 and send clinical information to SNF.  CSW will continue to follow and assist with discharge planning.  Employment status:     Insurance information:    PT Recommendations:    Information / Referral to community resources:     Patient/Family's Response to care:  Patient appreciative of CSW assistance with discharge planning.  Patient/Family's Understanding of and Emotional Response to Diagnosis, Current Treatment, and Prognosis:  Patient presented calm and remained engaged throughout the assessment. Patient and CSW discussed patient's prognosis, patient reported that his prognosis is poor. Patient was accepting of prognosis and reported that he is trying to adapt to his condition. Patient reported that he is trying "keep hope alive" and requested prayer. CSW provided emotional support and active listening. Patient reported that he has support from his mom and sisters.   Emotional Assessment Appearance:    Attitude/Demeanor/Rapport:    Affect (typically observed):    Orientation:    Alcohol / Substance use:    Psych involvement (Current and /or in the community):     Discharge Needs  Concerns to be addressed:    Readmission within the last 30 days:    Current discharge risk:    Barriers to Discharge:      Servando Snare, LCSW 07/12/2017, 11:14 AM

## 2017-07-12 NOTE — Progress Notes (Cosign Needed)
800 meds helddue to pt - he was eating "I will take them later" Later patient took med's and tolerated well. Complained about his thoracentesis the other day, but said it helped a lot. For pain assessment he stated 8 - I asked if he wanted anything for the pain he stated "No, I just muddle thorough" He was ordered RBC which the RN will administer.

## 2017-07-12 NOTE — Discharge Summary (Addendum)
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 Additionally, the right chest Port-A-Cath was injected. It is unclear whether there could be thrombus within the SVC and right atrium (series 5, image 184). This could be mixing artifact. Stable cardiac size. No pericardial effusion. Stable visible aorta. Mediastinum/Nodes: Loss of normal mediastinal fat planes, but no discrete mediastinal mass identified. Lungs/Pleura: Continued moderate size layering right pleural effusion, stable since March. Simple fluid density. Trace layering left pleural fluid. Stable major airway patency. Mildly regressed size and nodularity of the left upper lobe spiculated mass since March and 03/13/2017 (compare series 6, image 37 today to series 6, image 45 in February). However, contiguous opacity from the mass to the left hilum persists. Underlying emphysema, severe on the left.  No new pulmonary opacity. Upper Abdomen: Small volume perihepatic free fluid along the periphery of the visible right hepatic lobe is new,  but gastrohepatic ligament fluid as regressed and appears virtually resolved since 04/13/2017. Liver parenchyma, visible spleen, adrenal glands, kidneys, pancreas, and bowel appear within normal limits. Musculoskeletal: Stable visualized osseous structures. Heterogeneous sclerosis of T12 remain stable since February. Review of the MIP images confirms the above findings. IMPRESSION: 1. Positive for evidence of acute pulmonary thrombus at the left main pulmonary artery bifurcation and extending into the left upper lobe. Much of the visible thrombus is nonocclusive, and it might be tumor related, with adjacent peribronchial opacity tracking from the left upper lobe carcinoma to the hilum. 2. 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 No other pulmonary thrombus identified. 3. Mildly regressed size of  the spiculated left upper lung mass since March. 4. Continued moderate size layering right pleural effusion. Underlying emphysema. 5. Small volume perihepatic ascites is new since March but the previously-seen gastrohepatic ligament fluid has regressed. Electronically Signed: By: Genevie Ann M.D. On: 06/30/2017 08:13   US Abdomen Limited  Result Date: 07/02/2017 CLINICAL DATA:  Evaluate for ascites. EXAM: ULTRASOUND ABDOMEN LIMITED COMPARISON:  None. FINDINGS: Large right pleural effusion.  Small ascites in the abdomen. IMPRESSION: Large right pleural effusion.  Small ascites in the abdomen. Electronically Signed   By: Dorise Bullion III M.D   On: 07/02/2017 18:07   Dg Chest Port 1 View  Result Date: 07/12/2017 CLINICAL DATA:  Pneumothorax on right for follow-up. Thoracentesis 07/11/2017. lung cancer. EXAM: PORTABLE CHEST 1 VIEW COMPARISON:  07/11/2016 FINDINGS: Right IJ Port-A-Cath unchanged. Lungs are adequately inflated and demonstrate emphysematous disease. Small amount left pleural fluid unchanged. Slight worsening of a small amount right pleural fluid. Stable small  right basilar pneumothorax. Stable mild prominence of the right perihilar markings. Stable nodule opacity over the left upper lung previously evaluated by CT compatible with known lung cancer. Cardiomediastinal silhouette and remainder the exam is unchanged. IMPRESSION: Stable small right basilar pneumothorax. Small amount right pleural fluid slightly worse. Stable small left pleural effusion. Mild stable prominence of the right perihilar markings. Stable nodular opacity over the left upper lung representing known lung cancer. Electronically Signed   By: Marin Olp M.D.   On: 07/12/2017 07:25   Dg Abd Portable 1v  Result Date: 07/11/2017 CLINICAL DATA:  Abdominal pain.  Nausea vomiting.  Constipation. EXAM: PORTABLE ABDOMEN - 1 VIEW COMPARISON:  CT 07/03/2017. FINDINGS: Soft tissue structures are unremarkable. No bowel distention. Stool noted throughout the colon. Metallic fragment noted over the pelvis. Pelvic calcifications consistent phleboliths. No acute bony abnormality. IMPRESSION: 1.  Metallic fragment noted over the pelvis. 2. No acute abnormality otherwise noted. No bowel distention. Stool noted throughout the colon. Electronically Signed   By: Marcello Moores  Register   On: 07/11/2017 10:23   US Thoracentesis Asp Pleural Space W/img Guide  Result Date: 07/11/2017 INDICATION: Patient with history of metastatic lung cancer, dyspnea, recurrent right pleural effusion. Request made for diagnostic and therapeutic right thoracentesis. EXAM: ULTRASOUND GUIDED DIAGNOSTIC AND THERAPEUTIC RIGHT THORACENTESIS MEDICATIONS: None COMPLICATIONS: None immediate. PROCEDURE: An ultrasound guided thoracentesis was thoroughly discussed with the patient and questions answered. The benefits, risks, alternatives and complications were also discussed. The patient understands and wishes to proceed with the procedure. Written consent was obtained. Ultrasound was performed to localize and mark an adequate pocket of fluid in the  right chest. The area was then prepped and draped in the normal sterile fashion. 1% Lidocaine was used for local anesthesia. Under ultrasound guidance a 6 Fr Safe-T-Centesis catheter was introduced. Thoracentesis was performed. The catheter was removed and a dressing applied. FINDINGS: A total of approximately 680 cc of turbid, bloody fluid was removed. Samples were sent to the laboratory as requested by the clinical team. IMPRESSION: Successful ultrasound guided diagnostic and therapeutic right thoracentesis yielding 680 cc of pleural fluid. Patient was noted to have small right basilar pneumothorax on follow-up chest x-ray, questionable ex vacuo origin. Patient currently stable. We will repeat chest x-ray in a.m. to ensure stability. Read by: Rowe Robert, PA-C Electronically Signed   By: Lucrezia Europe M.D.   On: 07/11/2017 14:44   US Thoracentesis Asp Pleural Space W/img Guide  Result Date: 07/05/2017 INDICATION: Patient with history of metastatic lung cancer, dyspnea,  and recurrent right-sided pleural effusion. Request is made for diagnostic and therapeutic right thoracentesis. EXAM: ULTRASOUND GUIDED DIAGNOSTIC AND THERAPEUTIC RIGHT THORACENTESIS MEDICATIONS: 10 mL of 1% lidocaine COMPLICATIONS: None immediate. PROCEDURE: An ultrasound guided thoracentesis was thoroughly discussed with the patient and questions answered. The benefits, risks, alternatives and complications were also discussed. The patient understands and wishes to proceed with the procedure. Written consent was obtained. Ultrasound was performed to localize and mark an adequate pocket of fluid in the right chest. The area was then prepped and draped in the normal sterile fashion. 1% Lidocaine was used for local anesthesia. Under ultrasound guidance a 6 Fr Safe-T-Centesis catheter was introduced. Thoracentesis was performed. The catheter was removed and a dressing applied. FINDINGS: A total of approximately 1.2 L of blood-tinged fluid was  removed. Samples were sent to the laboratory as requested by the clinical team. IMPRESSION: Successful ultrasound guided right thoracentesis yielding 1.2 L of pleural fluid. Read by: Earley Abide, PA-C No pneumothorax on follow-up radiograph. Electronically Signed   By: Lucrezia Europe M.D.   On: 07/05/2017 14:12    Microbiology: Recent Results (from the past 240 hour(s))  Fungus Culture With Stain     Status: None (Preliminary result)   Collection Time: 07/05/17  1:36 PM  Result Value Ref Range Status   Fungus Stain Final report  Final    Comment: (NOTE) Performed At: [facility] [place], [place] [id number] Rush Farmer MD ID:[id number]    Fungus (Mycology) Culture PENDING  Incomplete   Fungal Source PLEURAL  Final    Comment: RIGHT Performed at [facility], [place] [address] [place], [place] [address]   Fungus Culture Result     Status: None   Collection Time: 07/05/17  1:36 PM  Result Value Ref Range Status   Result 1 Comment  Final    Comment: (NOTE) KOH/Calcofluor preparation:  no fungus observed. Performed At: [facility] [place], [place] [id number] Rush Farmer MD JK:[id number] Performed at [facility], [place] [address], [place], [place] [address]   Gram stain     Status: None   Collection Time: 07/11/17  1:08 PM  Result Value Ref Range Status   Specimen Description PLEURAL RIGHT  Final   Special Requests NONE  Final   Gram Stain   Final    RARE WBC PRESENT,BOTH PMN AND MONONUCLEAR NO ORGANISMS SEEN Performed at [facility], [place] [address] [place], [place] [address]    Report Status 07/11/2017 FINAL  Final     Labs: CBC: Recent Labs  Lab 07/06/17 0333 07/07/17 0403 07/09/17 0423 07/10/17 2350 07/11/17 0039 07/11/17 0450 07/12/17 0830  WBC 10.3 12.2* 11.2* 12.2*  --  13.3* 10.2  NEUTROABS 9.6* 9.3* 8.7* 10.7*  --   --  7.8*  HGB 7.5* 7.4* 7.6* 7.7* 6.8* 7.8* 7.2*   HCT 23.1* 23.7* 23.4* 24.0* 20.0* 24.6* 22.5*  MCV 103.6* 105.8* 104.5* 103.9*  --  104.2* 105.6*  PLT 168 157 167 178  --  175 539   Basic Metabolic Panel: Recent Labs  Lab 07/07/17 0403 07/09/17 0423 07/10/17 2350 07/11/17 0039 07/11/17 0450 07/12/17 0830  NA 145 145 143 138 142 143  K 3.5 3.8 4.5 4.4 4.4 3.8  CL 103 103 100* 98* 100* 101  CO2 35* 35* 33*  --  33* 35*  GLUCOSE 96 98 127* 125* 120* 86  BUN 15 16 28* 26* 26* 19  CREATININE 1.37* 1.70*  1.66* 1.60* 1.69* 1.82*  CALCIUM 8.5* 8.9 9.1  --  9.2 8.9   Liver Function Tests: No results for input(s): AST, ALT, ALKPHOS, BILITOT, PROT, ALBUMIN in the last 168 hours. No results for input(s): LIPASE, AMYLASE in the last 168 hours. No results for input(s): AMMONIA in the last 168 hours. Cardiac Enzymes: No results for input(s): CKTOTAL, CKMB, CKMBINDEX, TROPONINI in the last 168 hours. BNP (last 3 results) Recent Labs    06/30/17 0812 07/04/17 2146 07/11/17 0450  BNP 224.5* 168.6* 148.3*   CBG: No results for input(s): GLUCAP in the last 168 hours. Time spent: 35 minutes  Signed:  Berle Mull  [facility]  07/12/2017  , 1:48 PM

## 2017-07-12 NOTE — Progress Notes (Signed)
Spoke with Ginger, RN re: patient status this AM.  CXR shows stable small basilar pneumothorax.  Patient without difficulty breathing, shortness of breath, or discomfort this AM.  Breathing well on home O2 settings.  No needs at present.   Brynda Greathouse, MS RD PA-C 10:09 AM

## 2017-07-13 ENCOUNTER — Emergency Department (HOSPITAL_COMMUNITY)
Admission: EM | Admit: 2017-07-13 | Discharge: 2017-07-14 | Disposition: A | Discharge status: Skilled Nursing Facility | Payer: Medicaid Other | Attending: Emergency Medicine | Admitting: Emergency Medicine

## 2017-07-13 ENCOUNTER — Emergency Department (HOSPITAL_COMMUNITY): Payer: Medicaid Other

## 2017-07-13 ENCOUNTER — Encounter (HOSPITAL_COMMUNITY): Payer: Self-pay | Admitting: Emergency Medicine

## 2017-07-13 DIAGNOSIS — I130 Hypertensive heart and chronic kidney disease with heart failure and stage 1 through stage 4 chronic kidney disease, or unspecified chronic kidney disease: Secondary | ICD-10-CM | POA: Insufficient documentation

## 2017-07-13 DIAGNOSIS — J449 Chronic obstructive pulmonary disease, unspecified: Secondary | ICD-10-CM | POA: Diagnosis not present

## 2017-07-13 DIAGNOSIS — Z66 Do not resuscitate: Secondary | ICD-10-CM | POA: Insufficient documentation

## 2017-07-13 DIAGNOSIS — R079 Chest pain, unspecified: Secondary | ICD-10-CM | POA: Diagnosis not present

## 2017-07-13 DIAGNOSIS — Z79899 Other long term (current) drug therapy: Secondary | ICD-10-CM | POA: Insufficient documentation

## 2017-07-13 DIAGNOSIS — N183 Chronic kidney disease, stage 3 (moderate): Secondary | ICD-10-CM | POA: Insufficient documentation

## 2017-07-13 DIAGNOSIS — R0602 Shortness of breath: Secondary | ICD-10-CM | POA: Diagnosis not present

## 2017-07-13 DIAGNOSIS — I5042 Chronic combined systolic (congestive) and diastolic (congestive) heart failure: Secondary | ICD-10-CM | POA: Diagnosis not present

## 2017-07-13 DIAGNOSIS — Z7901 Long term (current) use of anticoagulants: Secondary | ICD-10-CM | POA: Insufficient documentation

## 2017-07-13 DIAGNOSIS — F17210 Nicotine dependence, cigarettes, uncomplicated: Secondary | ICD-10-CM | POA: Diagnosis not present

## 2017-07-13 DIAGNOSIS — C3492 Malignant neoplasm of unspecified part of left bronchus or lung: Secondary | ICD-10-CM | POA: Insufficient documentation

## 2017-07-13 LAB — COMPREHENSIVE METABOLIC PANEL
ALT: 16 U/L — ABNORMAL LOW (ref 17–63)
ANION GAP: 10 (ref 5–15)
AST: 25 U/L (ref 15–41)
Albumin: 3.5 g/dL (ref 3.5–5.0)
Alkaline Phosphatase: 100 U/L (ref 38–126)
BUN: 18 mg/dL (ref 6–20)
CHLORIDE: 101 mmol/L (ref 101–111)
CO2: 34 mmol/L — AB (ref 22–32)
Calcium: 9.5 mg/dL (ref 8.9–10.3)
Creatinine, Ser: 1.87 mg/dL — ABNORMAL HIGH (ref 0.61–1.24)
GFR, EST AFRICAN AMERICAN: 44 mL/min — AB (ref >60)
GFR, EST NON AFRICAN AMERICAN: 38 mL/min — AB (ref >60)
Glucose, Bld: 110 mg/dL — ABNORMAL HIGH (ref 65–99)
Potassium: 3.9 mmol/L (ref 3.5–5.1)
SODIUM: 145 mmol/L (ref 135–145)
Total Bilirubin: 0.8 mg/dL (ref 0.3–1.2)
Total Protein: 7.2 g/dL (ref 6.5–8.1)

## 2017-07-13 LAB — CBC WITH DIFFERENTIAL/PLATELET
Basophils Absolute: 0 10*3/uL (ref 0.0–0.1)
Basophils Relative: 0 %
Eosinophils Absolute: 0 10*3/uL (ref 0.0–0.7)
Eosinophils Relative: 0 %
HEMATOCRIT: 30.7 % — AB (ref 39.0–52.0)
HEMOGLOBIN: 9.9 g/dL — AB (ref 13.0–17.0)
LYMPHS ABS: 0.6 10*3/uL — AB (ref 0.7–4.0)
Lymphocytes Relative: 4 %
MCH: 32.8 pg (ref 26.0–34.0)
MCHC: 32.2 g/dL (ref 30.0–36.0)
MCV: 101.7 fL — ABNORMAL HIGH (ref 78.0–100.0)
MONOS PCT: 3 %
Monocytes Absolute: 0.4 10*3/uL (ref 0.1–1.0)
NEUTROS ABS: 12.3 10*3/uL — AB (ref 1.7–7.7)
NEUTROS PCT: 93 %
Platelets: 187 10*3/uL (ref 150–400)
RBC: 3.02 MIL/uL — AB (ref 4.22–5.81)
RDW: 18.9 % — ABNORMAL HIGH (ref 11.5–15.5)
WBC: 13.2 10*3/uL — AB (ref 4.0–10.5)

## 2017-07-13 LAB — I-STAT TROPONIN, ED: Troponin i, poc: 0.02 ng/mL (ref 0.00–0.08)

## 2017-07-13 LAB — I-STAT CG4 LACTIC ACID, ED: Lactic Acid, Venous: 0.76 mmol/L (ref 0.5–1.9)

## 2017-07-13 MED ORDER — OXYCODONE HCL 10 MG PO TABS: 10.0000 mg | ORAL_TABLET | ORAL | 0 refills | Status: DC | PRN

## 2017-07-13 MED ORDER — OXYCODONE HCL 5 MG PO TABS
10.0000 mg | ORAL_TABLET | Freq: Once | ORAL | Status: AC
  Administered 2017-07-13: 10 mg via ORAL
  Filled 2017-07-13: qty 2

## 2017-07-13 MED ORDER — ALBUTEROL SULFATE (2.5 MG/3ML) 0.083% IN NEBU
5.0000 mg | INHALATION_SOLUTION | Freq: Once | RESPIRATORY_TRACT | Status: AC
  Administered 2017-07-13: 5 mg via RESPIRATORY_TRACT
  Filled 2017-07-13: qty 6

## 2017-07-13 MED ORDER — IPRATROPIUM BROMIDE 0.02 % IN SOLN
0.5000 mg | Freq: Once | RESPIRATORY_TRACT | Status: AC
  Administered 2017-07-13: 0.5 mg via RESPIRATORY_TRACT
  Filled 2017-07-13: qty 2.5

## 2017-07-13 NOTE — ED Notes (Signed)
Pt. In xray 

## 2017-07-13 NOTE — Discharge Instructions (Addendum)
Thank you for allowing me to provide your care today in the Emergency Department.   Take 1 tablet of your home oxycodone every 4 hours for severe pain.  Please make sure to discuss your worsening pain with Dr. Earlie Server when you see him in the office on Tuesday.  Return to the emergency department if you develop severe shortness of breath, uncontrollable pain, high fever, or other new, concerning symptoms.

## 2017-07-13 NOTE — ED Notes (Signed)
EKG given to EDP,James, MD., for review. 

## 2017-07-13 NOTE — ED Provider Notes (Signed)
[facility] DEPT Provider Note   CSN: [id number] Arrival date & time: 07/13/17  1945     History   Chief Complaint No chief complaint on file.   HPI [name] is a 57 y.o. 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  The patient reports that he was just discharged from the hospital yesterday.  He reports sudden onset central chest pain that radiates to the back.  He characterizes the pain as sharp.  Pain is worse with leaning backwards and improved with tripoding and sitting forward.  He reports associated shortness of breath.  He reports that he had 2 thoracenteses to have pleural effusions drained within the last week.  He reports that he has not taken his home pain medication since yesterday because he is currently out of the medication.  He has a follow-up with Dr. Earlie Server, oncology, and 2 days.  He is set to follow-up with his pulmonologist tomorrow in the clinic.  He denies fever, chills, cough, abdominal pain, nausea, vomiting, diarrhea, syncope, dizziness, lightheadedness, leg swelling, or palpitations.  No treatment prior to arrival.  Patient is a DNR.  The history is provided by the patient. No language interpreter was used.    Past Medical History:  Diagnosis Date  . Abdominal pain 06/04/2016  . Adenocarcinoma of left lung, stage 4 ([place]) 05/02/2016  . Alcohol abuse   . Bronchitis due to tobacco use ([place])   . Cancer ([place])    Lung  . COPD (chronic obstructive pulmonary disease) ([place])   . Dehydration 06/04/2016  . Encounter for antineoplastic chemotherapy 05/02/2016  . Gastritis   . Goals of care, counseling/discussion 05/02/2016  . Hematemesis   . HTN (hypertension) 10/30/2016  . Recurrent upper respiratory infection (URI)   . Seizures ([place]) 05/2011   new onset  . Shortness of breath     Patient Active Problem List   Diagnosis Date Noted  . Acute on chronic respiratory failure ([place]) 07/11/2017  . History of pulmonary embolism 07/11/2017  . PE (pulmonary thromboembolism) ([place]) 06/30/2017  . Pulmonary embolism ([place]) 06/30/2017  . AKI (acute kidney injury) ([place]) 06/25/2017  . Dyspnea 06/21/2017  . Chronic combined systolic (congestive) and diastolic (congestive) heart failure ([place])   . Constipation 06/07/2017  . Pain   . Acute on chronic respiratory failure with hypoxia ([place]) 05/28/2017  . Chronic respiratory failure with hypoxia ([place]) 04/01/2017  . Leucocytosis 03/16/2017  . CKD (chronic kidney disease), stage III ([place]) 03/12/2017  . Malnutrition of moderate degree 03/10/2017  . COPD exacerbation ([place]) 03/09/2017  . Macrocytic anemia 02/07/2017  . Severe malnutrition ([place]) 01/03/2017  . DOE (dyspnea on exertion) 12/30/2016  . Nausea   . Hypervolemia   . HCAP (healthcare-associated pneumonia) 12/19/2016  . SOB (shortness of breath) 12/19/2016  . Erosive gastropathy 12/18/2016  . Gastritis 12/18/2016  . Hematemesis 12/16/2016  . Intractable nausea and vomiting 12/16/2016  . HTN (hypertension) 10/30/2016  . Chronic obstructive pulmonary disease ([place]) 09/02/2016  . COPD (chronic obstructive pulmonary disease) ([place]) 08/19/2016  . Dehydration 06/04/2016  . Abdominal pain 06/04/2016  . 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 Adenocarcinoma of left lung, stage 4 ([place]) 05/02/2016  . Goals of care, counseling/discussion 05/02/2016  . Encounter for antineoplastic chemotherapy 05/02/2016  . Tobacco abuse 05/30/2011  . Alcohol abuse 05/30/2011  .  Seizure ([place]) 05/28/2011    Past Surgical History:  Procedure Laterality Date  . ESOPHAGOGASTRODUODENOSCOPY (EGD) WITH PROPOFOL N/A 12/17/2016   Procedure: ESOPHAGOGASTRODUODENOSCOPY (EGD) WITH PROPOFOL;  Surgeon: Ladene Artist, MD;  Location: [facility] ENDOSCOPY;  Service: Endoscopy;   Laterality: N/A;  . IR FLUORO GUIDE PORT INSERTION RIGHT  05/13/2016  . IR US GUIDE VASC ACCESS RIGHT  05/13/2016  . NO PAST SURGERIES          Home Medications    Prior to Admission medications   Medication Sig Start Date End Date Taking? Authorizing Provider  acetaminophen (TYLENOL) 500 MG tablet Take 1 tablet (500 mg total) by mouth 2 (two) times daily as needed for mild pain. 07/07/17  Yes Florencia Reasons, MD  albuterol (PROVENTIL) (2.5 MG/3ML) 0.083% nebulizer solution 1 neb every 4-6 hours as needed for wheezing and shortness of breath Patient taking differently: Take 2.5 mg by nebulization every 4 (four) hours as needed for wheezing or shortness of breath.  04/01/17  Yes Parrett, Fonnie Mu, NP  apixaban (ELIQUIS) 5 MG TABS tablet Take 1 tablet (5 mg total) by mouth 2 (two) times daily. Patient taking differently: Take 10 mg by mouth 2 (two) times daily.  07/08/17  Yes Florencia Reasons, MD  Chlorphen-Phenyleph-ASA (ALKA-SELTZER PLUS COLD PO) Take 2 tablets by mouth at bedtime as needed (COUGH).   Yes [provider]  dexamethasone (DECADRON) 4 MG tablet Take 4 mg by mouth 2 (two) times daily. The day before, the day of, and the day after chemotherapy every three weeks   Yes [provider]  Dextromethorphan-guaiFENesin (TUSSIN DM) 10-100 MG/5ML liquid Take 5 mLs by mouth every 12 (twelve) hours.   Yes [provider]  escitalopram (LEXAPRO) 5 MG tablet Take 1 tablet (5 mg total) by mouth at bedtime. 04/17/17  Yes Rosita Fire, MD  feeding supplement, ENSURE ENLIVE, (ENSURE ENLIVE) LIQD Take 237 mLs by mouth 3 (three) times daily between meals. 03/19/17  Yes Shelly Coss, MD  Fluticasone-Umeclidin-Vilant (TRELEGY ELLIPTA) 100-62.5-25 MCG/INH AEPB Inhale 1 puff into the lungs daily. 05/31/17  Yes Aline August, MD  folic acid (FOLVITE) 1 MG tablet Take 1 tablet (1 mg total) by mouth daily. 07/13/17  Yes Lavina Hamman, MD  guaiFENesin (MUCINEX) 600 MG 12 hr tablet Take  1,200 mg by mouth 2 (two) times daily.    Yes [provider]  Ipratropium-Albuterol (COMBIVENT) 20-100 MCG/ACT AERS respimat Inhale 1 puff into the lungs every 6 (six) hours. 05/31/17  Yes Aline August, MD  loratadine (CLARITIN) 10 MG tablet Take 10 mg by mouth daily.   Yes [provider]  metoprolol tartrate (LOPRESSOR) 25 MG tablet Take 12.5 mg by mouth 2 (two) times daily.    Yes [provider]  Multiple Vitamins-Minerals (MULTIVITAMIN WITH MINERALS) tablet Take 1 tablet by mouth daily.   Yes [provider]  ondansetron (ZOFRAN-ODT) 4 MG disintegrating tablet Take 4 mg by mouth every 8 (eight) hours as needed for nausea or vomiting.   Yes [provider]  pantoprazole (PROTONIX) 40 MG tablet Take 1 tablet (40 mg total) by mouth 2 (two) times daily. 12/18/16  Yes Eugenie Filler, MD  predniSONE (DELTASONE) 20 MG tablet Take 40 mg by mouth daily with breakfast.   Yes [provider]  senna-docusate (SENOKOT-S) 8.6-50 MG tablet Take 2 tablets by mouth 2 (two) times daily. Patient taking differently: Take 1 tablet by mouth 2 (two) times daily.  07/12/17  Yes Lavina Hamman,  MD  ferrous sulfate 325 (65 FE) MG tablet Take 1 tablet (325 mg total) by mouth 2 (two) times daily with a meal. Patient not taking: Reported on 07/13/2017 07/12/17   Lavina Hamman, MD  furosemide (LASIX) 40 MG tablet Take 1 tablet (40 mg total) by mouth daily. Patient not taking: Reported on 07/13/2017 07/13/17   Lavina Hamman, MD  gabapentin (NEURONTIN) 100 MG capsule Take 1 capsule (100 mg total) by mouth 3 (three) times daily. Patient not taking: Reported on 07/13/2017 07/12/17   Lavina Hamman, MD  Oxycodone HCl 10 MG TABS Take 1 tablet (10 mg total) by mouth every 4 (four) hours as needed for up to 15 doses (pain/shortness of breath). 07/13/17   Mylan Schwarz A, PA-C  predniSONE (DELTASONE) 10 MG tablet Take 20mg  daily for 3days,Take 10mg  daily for 3days, then  stop. Patient not taking: Reported on 07/13/2017 07/12/17   Lavina Hamman, MD  sucralfate (CARAFATE) 1 GM/10ML suspension Take 10 mLs (1 g total) by mouth 4 (four) times daily -  with meals and at bedtime. Patient not taking: Reported on 07/13/2017 07/12/17   Lavina Hamman, MD    Family History Family History  Problem Relation Age of Onset  . Cancer Father   . Diabetes Mellitus II Mother     Social History Social History   Tobacco Use  . Smoking status: Current Some Day Smoker    Packs/day: 0.10    Years: 30.00    Pack years: 3.00    Types: Cigarettes  . Smokeless tobacco: Never Used  . Tobacco comment: 1-2 cig a day   Substance Use Topics  . Alcohol use: No    Frequency: Never  . Drug use: No     Allergies   Patient has no known allergies.   Review of Systems Review of Systems  Constitutional: Negative for appetite change and fever.  HENT: Negative for congestion, sinus pressure and sore throat.   Eyes: Negative for visual disturbance.  Respiratory: Positive for shortness of breath and wheezing. Negative for cough.   Cardiovascular: Positive for chest pain. Negative for palpitations and leg swelling.  Gastrointestinal: Negative for abdominal pain, diarrhea, nausea and vomiting.  Genitourinary: Negative for dysuria, frequency, hematuria and urgency.  Musculoskeletal: Negative for back pain and neck pain.  Skin: Negative for rash.  Allergic/Immunologic: Negative for immunocompromised state.  Neurological: Negative for seizures, weakness and headaches.  Psychiatric/Behavioral: Negative for confusion.   Physical Exam Updated Vital Signs BP (!) 148/90 (BP Location: Right Arm)   Pulse 78   Temp 98.1 F (36.7 C) (Oral)   Resp 16   SpO2 98%   Physical Exam  Constitutional: He appears well-developed.  Nasal cannula in place  HENT:  Head: Normocephalic.  Eyes: Pupils are equal, round, and reactive to light. Conjunctivae and EOM are normal.  Neck: Normal range  of motion. Neck supple.  Cardiovascular: Normal rate, regular rhythm, normal heart sounds and intact distal pulses. Exam reveals no gallop and no friction rub.  No murmur heard. Pulmonary/Chest: Effort normal. No stridor. No respiratory distress. He has wheezes. He has no rales. He exhibits no tenderness.  Scattered inspiratory and expiratory wheezes bilaterally.  Abdominal: Soft. He exhibits no distension and no mass. There is no tenderness. There is no rebound and no guarding. No hernia.  Musculoskeletal: He exhibits no tenderness.  Neurological: He is alert.  Skin: Skin is warm and dry.  Psychiatric: His behavior is normal.  Nursing note and  vitals reviewed.  ED Treatments / Results  Labs (all labs ordered are listed, but only abnormal results are displayed) Labs Reviewed  COMPREHENSIVE METABOLIC PANEL - Abnormal; Notable for the following components:      Result Value   CO2 34 (*)    Glucose, Bld 110 (*)    Creatinine, Ser 1.87 (*)    ALT 16 (*)    GFR calc non Af Amer 38 (*)    GFR calc Af Amer 44 (*)    All other components within normal limits  CBC WITH DIFFERENTIAL/PLATELET - Abnormal; Notable for the following components:   WBC 13.2 (*)    RBC 3.02 (*)    Hemoglobin 9.9 (*)    HCT 30.7 (*)    MCV 101.7 (*)    RDW 18.9 (*)    Neutro Abs 12.3 (*)    Lymphs Abs 0.6 (*)    All other components within normal limits  I-STAT CG4 LACTIC ACID, ED  I-STAT TROPONIN, ED  I-STAT CG4 LACTIC ACID, ED    EKG None  Radiology Dg Chest 2 View  Result Date: 07/13/2017 CLINICAL DATA:  57 year old male with dyspnea and chest pain. EXAM: CHEST - 2 VIEW COMPARISON:  Chest radiograph dated 07/12/2017 FINDINGS: Right-sided Port-A-Cath with tip in stable position. There is severe emphysematous changes of the lungs. Faint nodular density in the left upper lobe better seen on the prior CT. There is no pneumothorax. Small stable bilateral pleural effusions, right greater left. The cardiac  silhouette is within normal limits. No acute osseous pathology. IMPRESSION: 1. No acute cardiopulmonary process. 2. Severe emphysema and stable bilateral pleural effusions with Electronically Signed   By: Anner Crete M.D.   On: 07/13/2017 21:53   Dg Chest Port 1 View  Result Date: 07/12/2017 CLINICAL DATA:  Pneumothorax on right for follow-up. Thoracentesis 07/11/2017. lung cancer. EXAM: PORTABLE CHEST 1 VIEW COMPARISON:  07/11/2016 FINDINGS: Right IJ Port-A-Cath unchanged. Lungs are adequately inflated and demonstrate emphysematous disease. Small amount left pleural fluid unchanged. Slight worsening of a small amount right pleural fluid. Stable small right basilar pneumothorax. Stable mild prominence of the right perihilar markings. Stable nodule opacity over the left upper lung previously evaluated by CT compatible with known lung cancer. Cardiomediastinal silhouette and remainder the exam is unchanged. IMPRESSION: Stable small right basilar pneumothorax. Small amount right pleural fluid slightly worse. Stable small left pleural effusion. Mild stable prominence of the right perihilar markings. Stable nodular opacity over the left upper lung representing known lung cancer. Electronically Signed   By: Marin Olp M.D.   On: 07/12/2017 07:25    Procedures Procedures (including critical care time)  Medications Ordered in ED Medications  albuterol (PROVENTIL) (2.5 MG/3ML) 0.083% nebulizer solution 5 mg (5 mg Nebulization Given 07/13/17 2226)  ipratropium (ATROVENT) nebulizer solution 0.5 mg (0.5 mg Nebulization Given 07/13/17 2226)  oxyCODONE (Oxy IR/ROXICODONE) immediate release tablet 10 mg (10 mg Oral Given 07/13/17 2226)     Initial Impression / Assessment and Plan / ED Course  I have reviewed the triage vital signs and the nursing notes.  Pertinent labs & imaging results that were available during my care of the patient were reviewed by me and considered in my medical decision making (see  chart for details).      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  The patient was discharged from the hospital yesterday.  He has run out of his home pain medication.  On exam, scattered inspiratory and expiratory wheezes are present bilaterally.  Chest x-ray with stable pleural effusions.  Labs appear to be at the patient's baseline.  Troponin is negative.  EKG with no acute changes.  The patient was discussed with Dr. Zenia Resides, attending physician.  He has been given a dose of his home pain medication and a DuoNeb.    On reevaluation, he reports that his dyspnea has improved and his chest pain is started to improve.  His breathing is at his baseline.  I discussed with the patient that he should follow-up with Dr. Earlie Server to discuss his home pain medication regimen and if he is not getting any relief.  He has a follow-up appointment in 2 days.  I will provide him with a 2-day supply of his home oxycodone until he can follow-up with oncology.  Doubt ACS, PE, worsening pleural effusion, or pneumonia.  Strict return precautions given.  The patient is hemodynamically stable and in no acute distress. He is safe for discharge home at this time.  Final Clinical Impressions(s) / ED Diagnoses   Final diagnoses:  Chest pain, unspecified type  Malignant neoplasm of left lung stage 4 [facility])    ED Discharge Orders        Ordered    Oxycodone HCl 10 MG TABS  Every 4 hours PRN     07/13/17 2321       General Wearing A, PA-C 07/14/17 0100    Lacretia Leigh, MD 07/14/17 2021

## 2017-07-13 NOTE — ED Triage Notes (Signed)
[facility] started yesterday. Pt was seen here yesterday for SOB. Pt hx of Lung Ca stage. Per EMS pt is DNR. DNR paper not present.

## 2017-07-13 NOTE — ED Notes (Signed)
Bed: GX21 Expected date:  Expected time:  Means of arrival:  Comments: EMS 43 M Generalized pain

## 2017-07-13 NOTE — ED Notes (Signed)
Pt. Refused to get into gown. RN,Junior made aware.

## 2017-07-14 ENCOUNTER — Encounter: Payer: Medicaid Other | Admitting: Cardiothoracic Surgery

## 2017-07-14 LAB — BPAM RBC
Blood Product Expiration Date: 201907192359
ISSUE DATE / TIME: 201906221253
Unit Type and Rh: 5100

## 2017-07-14 LAB — TYPE AND SCREEN
ABO/RH(D): O POS
ANTIBODY SCREEN: NEGATIVE
UNIT DIVISION: 0

## 2017-07-15 ENCOUNTER — Inpatient Hospital Stay: Payer: Medicaid Other | Attending: Internal Medicine

## 2017-07-15 ENCOUNTER — Inpatient Hospital Stay (HOSPITAL_BASED_OUTPATIENT_CLINIC_OR_DEPARTMENT_OTHER): Payer: Medicaid Other | Admitting: Internal Medicine

## 2017-07-15 ENCOUNTER — Telehealth: Payer: Self-pay | Admitting: Internal Medicine

## 2017-07-15 ENCOUNTER — Encounter: Payer: Self-pay | Admitting: Internal Medicine

## 2017-07-15 ENCOUNTER — Inpatient Hospital Stay: Payer: Medicaid Other

## 2017-07-15 ENCOUNTER — Encounter: Payer: Self-pay | Admitting: *Deleted

## 2017-07-15 ENCOUNTER — Other Ambulatory Visit: Payer: Self-pay | Admitting: Cardiothoracic Surgery

## 2017-07-15 ENCOUNTER — Telehealth: Payer: Self-pay | Admitting: Medical Oncology

## 2017-07-15 VITALS — BP 145/96 | HR 70 | Temp 98.1°F | Resp 18 | Ht 71.0 in | Wt 118.6 lb

## 2017-07-15 DIAGNOSIS — Z7901 Long term (current) use of anticoagulants: Secondary | ICD-10-CM

## 2017-07-15 DIAGNOSIS — C3492 Malignant neoplasm of unspecified part of left bronchus or lung: Secondary | ICD-10-CM

## 2017-07-15 DIAGNOSIS — J90 Pleural effusion, not elsewhere classified: Secondary | ICD-10-CM

## 2017-07-15 DIAGNOSIS — I2699 Other pulmonary embolism without acute cor pulmonale: Secondary | ICD-10-CM | POA: Diagnosis not present

## 2017-07-15 DIAGNOSIS — Z5111 Encounter for antineoplastic chemotherapy: Secondary | ICD-10-CM

## 2017-07-15 DIAGNOSIS — I10 Essential (primary) hypertension: Secondary | ICD-10-CM | POA: Diagnosis not present

## 2017-07-15 DIAGNOSIS — Z9981 Dependence on supplemental oxygen: Secondary | ICD-10-CM

## 2017-07-15 LAB — CBC WITH DIFFERENTIAL/PLATELET
BASOS PCT: 0 %
Basophils Absolute: 0 10*3/uL (ref 0.0–0.1)
EOS ABS: 0 10*3/uL (ref 0.0–0.5)
Eosinophils Relative: 0 %
HCT: 27.1 % — ABNORMAL LOW (ref 38.4–49.9)
HEMOGLOBIN: 9 g/dL — AB (ref 13.0–17.1)
Lymphocytes Relative: 1 %
Lymphs Abs: 0.2 10*3/uL — ABNORMAL LOW (ref 0.9–3.3)
MCH: 33.3 pg (ref 27.2–33.4)
MCHC: 33.2 g/dL (ref 32.0–36.0)
MCV: 100.4 fL — ABNORMAL HIGH (ref 79.3–98.0)
MONOS PCT: 10 %
Monocytes Absolute: 1.4 10*3/uL — ABNORMAL HIGH (ref 0.1–0.9)
NEUTROS PCT: 89 %
Neutro Abs: 12.4 10*3/uL — ABNORMAL HIGH (ref 1.5–6.5)
PLATELETS: 187 10*3/uL (ref 140–400)
RBC: 2.7 MIL/uL — ABNORMAL LOW (ref 4.20–5.82)
RDW: 18.6 % — ABNORMAL HIGH (ref 11.0–14.6)
WBC: 14 10*3/uL — ABNORMAL HIGH (ref 4.0–10.3)

## 2017-07-15 LAB — COMPREHENSIVE METABOLIC PANEL
ALK PHOS: 121 U/L (ref 38–126)
ALT: 12 U/L (ref 0–44)
AST: 20 U/L (ref 15–41)
Albumin: 3.2 g/dL — ABNORMAL LOW (ref 3.5–5.0)
Anion gap: 12 (ref 5–15)
BUN: 26 mg/dL — ABNORMAL HIGH (ref 6–20)
CALCIUM: 9.8 mg/dL (ref 8.9–10.3)
CO2: 31 mmol/L (ref 22–32)
CREATININE: 1.66 mg/dL — AB (ref 0.61–1.24)
Chloride: 99 mmol/L (ref 98–111)
GFR, EST AFRICAN AMERICAN: 51 mL/min — AB (ref >60)
GFR, EST NON AFRICAN AMERICAN: 44 mL/min — AB (ref >60)
Glucose, Bld: 119 mg/dL — ABNORMAL HIGH (ref 70–99)
Potassium: 3.9 mmol/L (ref 3.5–5.1)
Sodium: 142 mmol/L (ref 135–145)
Total Bilirubin: <0.2 mg/dL — ABNORMAL LOW (ref 0.3–1.2)
Total Protein: 6.6 g/dL (ref 6.5–8.1)

## 2017-07-15 MED ORDER — SODIUM CHLORIDE 0.9 % IV SOLN
510.0000 mg/m2 | Freq: Once | INTRAVENOUS | Status: AC
  Administered 2017-07-15: 900 mg via INTRAVENOUS
  Filled 2017-07-15: qty 20

## 2017-07-15 MED ORDER — SODIUM CHLORIDE 0.9% FLUSH
10.0000 mL | INTRAVENOUS | Status: DC | PRN
  Administered 2017-07-15: 10 mL
  Filled 2017-07-15: qty 10

## 2017-07-15 MED ORDER — PROCHLORPERAZINE MALEATE 10 MG PO TABS
10.0000 mg | ORAL_TABLET | Freq: Once | ORAL | Status: AC
  Administered 2017-07-15: 10 mg via ORAL

## 2017-07-15 MED ORDER — SODIUM CHLORIDE 0.9 % IV SOLN
Freq: Once | INTRAVENOUS | Status: AC
  Administered 2017-07-15: 13:00:00 via INTRAVENOUS

## 2017-07-15 MED ORDER — PROCHLORPERAZINE MALEATE 10 MG PO TABS
ORAL_TABLET | ORAL | Status: AC
  Filled 2017-07-15: qty 1

## 2017-07-15 MED ORDER — HEPARIN SOD (PORK) LOCK FLUSH 100 UNIT/ML IV SOLN
500.0000 [IU] | Freq: Once | INTRAVENOUS | Status: AC | PRN
  Administered 2017-07-15: 500 [IU]
  Filled 2017-07-15: qty 5

## 2017-07-15 NOTE — Patient Instructions (Signed)
[facility] Discharge Instructions for Patients Receiving Chemotherapy  Today you received the following chemotherapy agent: Alimta  To help prevent nausea and vomiting after your treatment, we encourage you to take your nausea medication as prescribed   If you develop nausea and vomiting that is not controlled by your nausea medication, call the clinic.   BELOW ARE SYMPTOMS THAT SHOULD BE REPORTED IMMEDIATELY:  *FEVER GREATER THAN 100.5 F  *CHILLS WITH OR WITHOUT FEVER  NAUSEA AND VOMITING THAT IS NOT CONTROLLED WITH YOUR NAUSEA MEDICATION  *UNUSUAL SHORTNESS OF BREATH  *UNUSUAL BRUISING OR BLEEDING  TENDERNESS IN MOUTH AND THROAT WITH OR WITHOUT PRESENCE OF ULCERS  *URINARY PROBLEMS  *BOWEL PROBLEMS  UNUSUAL RASH Items with * indicate a potential emergency and should be followed up as soon as possible.  Feel free to call the clinic should you have any questions or concerns. The clinic phone number is [phone].  Please show the [place] [facility] at check-in to the Emergency Department and triage nurse.

## 2017-07-15 NOTE — Telephone Encounter (Signed)
Schedule dappt pe r6/25 los - pt to get an updated schedule in treatment area.

## 2017-07-15 NOTE — Progress Notes (Signed)
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  Pneumothorax on right for follow-up. Thoracentesis 07/11/2017. lung cancer. EXAM: PORTABLE CHEST 1 VIEW COMPARISON:  07/11/2016 FINDINGS: Right IJ Port-A-Cath unchanged. Lungs are adequately inflated and demonstrate emphysematous disease. Small amount left pleural fluid unchanged. Slight worsening of a small amount right pleural fluid. Stable small right basilar pneumothorax. Stable mild prominence of the right perihilar markings. Stable nodule opacity over the left upper lung previously evaluated by CT compatible with known lung cancer. Cardiomediastinal silhouette and remainder the exam is unchanged. IMPRESSION: Stable small right basilar pneumothorax. Small amount right pleural fluid slightly worse. Stable small left pleural effusion. Mild stable prominence of the right perihilar markings. Stable nodular opacity over the left upper lung representing known lung cancer. Electronically Signed   By: Marin Olp M.D.   On: 07/12/2017 07:25   Dg Abd Portable 1v  Result Date: 07/11/2017 CLINICAL DATA:  Abdominal pain.  Nausea vomiting.  Constipation. EXAM: PORTABLE ABDOMEN - 1 VIEW COMPARISON:  CT 07/03/2017. FINDINGS: Soft tissue structures are  unremarkable. No bowel distention. Stool noted throughout the colon. Metallic fragment noted over the pelvis. Pelvic calcifications consistent phleboliths. No acute bony abnormality. IMPRESSION: 1.  Metallic fragment noted over the pelvis. 2. No acute abnormality otherwise noted. No bowel distention. Stool noted throughout the colon. Electronically Signed   By: Marcello Moores  Register   On: 07/11/2017 10:23   US Thoracentesis Asp Pleural Space W/img Guide  Result Date: 07/11/2017 INDICATION: Patient with history of metastatic lung cancer, dyspnea, recurrent right pleural effusion. Request made for diagnostic and therapeutic right thoracentesis. EXAM: ULTRASOUND GUIDED DIAGNOSTIC AND THERAPEUTIC RIGHT THORACENTESIS MEDICATIONS: None COMPLICATIONS: None immediate. PROCEDURE: An ultrasound guided thoracentesis was thoroughly discussed with the patient and questions answered. The benefits, risks, alternatives and complications were also discussed. The patient understands and wishes to proceed with the procedure. Written consent was obtained. Ultrasound was performed to localize and mark an adequate pocket of fluid in the right chest. The area was then prepped and draped in the normal sterile fashion. 1% Lidocaine was used  for local anesthesia. Under ultrasound guidance a 6 Fr Safe-T-Centesis catheter was introduced. Thoracentesis was performed. The catheter was removed and a dressing applied. FINDINGS: A total of approximately 680 cc of turbid, bloody fluid was removed. Samples were sent to the laboratory as requested by the clinical team. IMPRESSION: Successful ultrasound guided diagnostic and therapeutic right thoracentesis yielding 680 cc of pleural fluid. Patient was noted to have small right basilar pneumothorax on follow-up chest x-ray, questionable ex vacuo origin. Patient currently stable. We will repeat chest x-ray in a.m. to ensure stability. Read by: Rowe Robert, PA-C Electronically Signed   By: Lucrezia Europe M.D.    On: 07/11/2017 14:44   US Thoracentesis Asp Pleural Space W/img Guide  Result Date: 07/05/2017 INDICATION: Patient with history of metastatic lung cancer, dyspnea, and recurrent right-sided pleural effusion. Request is made for diagnostic and therapeutic right thoracentesis. EXAM: ULTRASOUND GUIDED DIAGNOSTIC AND THERAPEUTIC RIGHT THORACENTESIS MEDICATIONS: 10 mL of 1% lidocaine COMPLICATIONS: None immediate. PROCEDURE: An ultrasound guided thoracentesis was thoroughly discussed with the patient and questions answered. The benefits, risks, alternatives and complications were also discussed. The patient understands and wishes to proceed with the procedure. Written consent was obtained. Ultrasound was performed to localize and mark an adequate pocket of fluid in the right chest. The area was then prepped and draped in the normal sterile fashion. 1% Lidocaine was used for local anesthesia. Under ultrasound guidance a 6 Fr Safe-T-Centesis catheter was introduced. Thoracentesis was performed. The catheter was removed and a dressing applied. FINDINGS: A total of approximately 1.2 L of blood-tinged fluid was removed. Samples were sent to the laboratory as requested by the clinical team. IMPRESSION: Successful ultrasound guided right thoracentesis yielding 1.2 L of pleural fluid. Read by: Earley Abide, PA-C No pneumothorax on follow-up radiograph. Electronically Signed   By: Lucrezia Europe M.D.   On: 07/05/2017 14:12    ASSESSMENT AND PLAN:  This is a very pleasant 57 years old African-American male with a stage IV non-small cell lung cancer, adenocarcinoma with no actionable mutations. He underwent systemic chemotherapy with carboplatin, Alimta and Avastin status post 6 cycles and has been tolerating the treatment fairly well. He is currently undergoing maintenance treatment with Alimta 500 MG/M2 every 3 weeks, status post 7 cycles. He has been tolerating this treatment well with no concerning complaints.  He  had repeat CT scan of the chest performed during his hospitalization and it did not show any evidence for disease progression.  I discussed the scan results with the patient and I recommended for him to continue his current treatment with maintenance Alimta.  He will proceed with cycle #8 today. For the recurrent right pleural effusion, I will refer the patient to cardiothoracic surgery for consideration of right Pleurx catheter placement. For the recently diagnosed pulmonary embolism, the patient will continue his current treatment with Eliquis. He will come back for follow-up visit in 3 weeks for evaluation before the next cycle of his treatment. The patient was advised to call immediately if he has any concerning symptoms in the interval. The patient voices understanding of current disease status and treatment options and is in agreement with the current care plan. All questions were answered. The patient knows to call the clinic with any problems, questions or concerns. We can certainly see the patient much sooner if necessary.  Disclaimer: This note was dictated with voice recognition software. Similar sounding words can inadvertently be transcribed and may not be corrected upon review.

## 2017-07-15 NOTE — Progress Notes (Signed)
MD Mohamed reviewed abnormal CBC & CMP for 6/25, ok to treat.

## 2017-07-15 NOTE — Progress Notes (Signed)
Oncology Nurse Navigator Documentation  Oncology Nurse Navigator Flowsheets 07/15/2017  Navigator Location [facility]  Navigator Encounter Type Other/per [name], referral to [facility] completed for pleur x cath placement..   Treatment Phase Treatment  Barriers/Navigation Needs Coordination of Care  Interventions Coordination of Care  Coordination of Care Other  Acuity Level 2  Time Spent with Patient 30

## 2017-07-15 NOTE — Patient Instructions (Signed)
Steps to Quit Smoking Smoking tobacco can be bad for your health. It can also affect almost every organ in your body. Smoking puts you and people around you at risk for many serious long-lasting (chronic) diseases. Quitting smoking is hard, but it is one of the best things that you can do for your health. It is never too late to quit. What are the benefits of quitting smoking? When you quit smoking, you lower your risk for getting serious diseases and conditions. They can include:  Lung cancer or lung disease.  Heart disease.  Stroke.  Heart attack.  Not being able to have children (infertility).  Weak bones (osteoporosis) and broken bones (fractures).  If you have coughing, wheezing, and shortness of breath, those symptoms may get better when you quit. You may also get sick less often. If you are pregnant, quitting smoking can help to lower your chances of having a baby of low birth weight. What can I do to help me quit smoking? Talk with your doctor about what can help you quit smoking. Some things you can do (strategies) include:  Quitting smoking totally, instead of slowly cutting back how much you smoke over a period of time.  Going to in-person counseling. You are more likely to quit if you go to many counseling sessions.  Using resources and support systems, such as: ? Online chats with a counselor. ? Phone quitlines. ? Printed self-help materials. ? Support groups or group counseling. ? Text messaging programs. ? Mobile phone apps or applications.  Taking medicines. Some of these medicines may have nicotine in them. If you are pregnant or breastfeeding, do not take any medicines to quit smoking unless your doctor says it is okay. Talk with your doctor about counseling or other things that can help you.  Talk with your doctor about using more than one strategy at the same time, such as taking medicines while you are also going to in-person counseling. This can help make  quitting easier. What things can I do to make it easier to quit? Quitting smoking might feel very hard at first, but there is a lot that you can do to make it easier. Take these steps:  Talk to your family and friends. Ask them to support and encourage you.  Call phone quitlines, reach out to support groups, or work with a counselor.  Ask people who smoke to not smoke around you.  Avoid places that make you want (trigger) to smoke, such as: ? Bars. ? Parties. ? Smoke-break areas at work.  Spend time with people who do not smoke.  Lower the stress in your life. Stress can make you want to smoke. Try these things to help your stress: ? Getting regular exercise. ? Deep-breathing exercises. ? Yoga. ? Meditating. ? Doing a body scan. To do this, close your eyes, focus on one area of your body at a time from head to toe, and notice which parts of your body are tense. Try to relax the muscles in those areas.  Download or buy apps on your mobile phone or tablet that can help you stick to your quit plan. There are many free apps, such as QuitGuide from the CDC (Centers for Disease Control and Prevention). You can find more support from smokefree.gov and other websites.  This information is not intended to replace advice given to you by your health care provider. Make sure you discuss any questions you have with your health care provider. Document Released: 11/03/2008 Document   Revised: 09/05/2015 Document Reviewed: 05/24/2014 Elsevier Interactive Patient Education  2018 [employer] Inc.  

## 2017-07-15 NOTE — Telephone Encounter (Signed)
[name] at [facility] said pt will be evaluated for smaller portable tank he can carry on shoulder. Pt notified.

## 2017-07-16 ENCOUNTER — Encounter (HOSPITAL_COMMUNITY): Payer: Self-pay

## 2017-07-16 ENCOUNTER — Emergency Department (HOSPITAL_COMMUNITY): Payer: Medicaid Other

## 2017-07-16 ENCOUNTER — Other Ambulatory Visit: Payer: Self-pay

## 2017-07-16 ENCOUNTER — Emergency Department (HOSPITAL_COMMUNITY)
Admission: EM | Admit: 2017-07-16 | Discharge: 2017-07-17 | Disposition: A | Discharge status: Home / Self Care | Payer: Medicaid Other | Attending: Emergency Medicine | Admitting: Emergency Medicine

## 2017-07-16 DIAGNOSIS — R1013 Epigastric pain: Secondary | ICD-10-CM | POA: Insufficient documentation

## 2017-07-16 DIAGNOSIS — F17210 Nicotine dependence, cigarettes, uncomplicated: Secondary | ICD-10-CM | POA: Diagnosis not present

## 2017-07-16 DIAGNOSIS — I5042 Chronic combined systolic (congestive) and diastolic (congestive) heart failure: Secondary | ICD-10-CM | POA: Insufficient documentation

## 2017-07-16 DIAGNOSIS — Z7901 Long term (current) use of anticoagulants: Secondary | ICD-10-CM | POA: Diagnosis not present

## 2017-07-16 DIAGNOSIS — J441 Chronic obstructive pulmonary disease with (acute) exacerbation: Secondary | ICD-10-CM | POA: Insufficient documentation

## 2017-07-16 DIAGNOSIS — I130 Hypertensive heart and chronic kidney disease with heart failure and stage 1 through stage 4 chronic kidney disease, or unspecified chronic kidney disease: Secondary | ICD-10-CM | POA: Diagnosis not present

## 2017-07-16 DIAGNOSIS — R531 Weakness: Secondary | ICD-10-CM | POA: Diagnosis present

## 2017-07-16 DIAGNOSIS — N183 Chronic kidney disease, stage 3 (moderate): Secondary | ICD-10-CM | POA: Diagnosis not present

## 2017-07-16 DIAGNOSIS — Z79899 Other long term (current) drug therapy: Secondary | ICD-10-CM | POA: Diagnosis not present

## 2017-07-16 DIAGNOSIS — Z85118 Personal history of other malignant neoplasm of bronchus and lung: Secondary | ICD-10-CM | POA: Diagnosis not present

## 2017-07-16 LAB — COMPREHENSIVE METABOLIC PANEL
ALK PHOS: 92 U/L (ref 38–126)
ALT: 14 U/L (ref 0–44)
AST: 23 U/L (ref 15–41)
Albumin: 3.1 g/dL — ABNORMAL LOW (ref 3.5–5.0)
Anion gap: 9 (ref 5–15)
BUN: 30 mg/dL — AB (ref 6–20)
CALCIUM: 9.1 mg/dL (ref 8.9–10.3)
CHLORIDE: 104 mmol/L (ref 98–111)
CO2: 31 mmol/L (ref 22–32)
CREATININE: 1.54 mg/dL — AB (ref 0.61–1.24)
GFR, EST AFRICAN AMERICAN: 56 mL/min — AB (ref >60)
GFR, EST NON AFRICAN AMERICAN: 48 mL/min — AB (ref >60)
Glucose, Bld: 99 mg/dL (ref 70–99)
Potassium: 4 mmol/L (ref 3.5–5.1)
Sodium: 144 mmol/L (ref 135–145)
Total Bilirubin: 0.7 mg/dL (ref 0.3–1.2)
Total Protein: 6.2 g/dL — ABNORMAL LOW (ref 6.5–8.1)

## 2017-07-16 LAB — CULTURE, BLOOD (ROUTINE X 2)
CULTURE: NO GROWTH
Culture: NO GROWTH
Special Requests: ADEQUATE
Special Requests: ADEQUATE

## 2017-07-16 LAB — CBC WITH DIFFERENTIAL/PLATELET
BASOS ABS: 0 10*3/uL (ref 0.0–0.1)
Basophils Relative: 0 %
Eosinophils Absolute: 0 10*3/uL (ref 0.0–0.7)
Eosinophils Relative: 0 %
HCT: 28.4 % — ABNORMAL LOW (ref 39.0–52.0)
Hemoglobin: 9.2 g/dL — ABNORMAL LOW (ref 13.0–17.0)
Lymphocytes Relative: 5 %
Lymphs Abs: 0.9 10*3/uL (ref 0.7–4.0)
MCH: 33.2 pg (ref 26.0–34.0)
MCHC: 32.4 g/dL (ref 30.0–36.0)
MCV: 102.5 fL — ABNORMAL HIGH (ref 78.0–100.0)
MONO ABS: 0.7 10*3/uL (ref 0.1–1.0)
MONOS PCT: 4 %
NEUTROS ABS: 16.4 10*3/uL — AB (ref 1.7–7.7)
Neutrophils Relative %: 91 %
Platelets: 218 10*3/uL (ref 150–400)
RBC: 2.77 MIL/uL — AB (ref 4.22–5.81)
RDW: 18.4 % — ABNORMAL HIGH (ref 11.5–15.5)
WBC: 18 10*3/uL — AB (ref 4.0–10.5)

## 2017-07-16 LAB — I-STAT TROPONIN, ED: Troponin i, poc: 0.03 ng/mL (ref 0.00–0.08)

## 2017-07-16 LAB — URINALYSIS, ROUTINE W REFLEX MICROSCOPIC
GLUCOSE, UA: NEGATIVE mg/dL
Hgb urine dipstick: NEGATIVE
KETONES UR: NEGATIVE mg/dL
LEUKOCYTES UA: NEGATIVE
Nitrite: NEGATIVE
PROTEIN: NEGATIVE mg/dL
Specific Gravity, Urine: 1.011 (ref 1.005–1.030)
pH: 6 (ref 5.0–8.0)

## 2017-07-16 LAB — LIPASE, BLOOD: LIPASE: 26 U/L (ref 11–51)

## 2017-07-16 LAB — CULTURE, BODY FLUID-BOTTLE

## 2017-07-16 LAB — CULTURE, BODY FLUID W GRAM STAIN -BOTTLE: Culture: NO GROWTH

## 2017-07-16 MED ORDER — IPRATROPIUM-ALBUTEROL 0.5-2.5 (3) MG/3ML IN SOLN
3.0000 mL | Freq: Four times a day (QID) | RESPIRATORY_TRACT | Status: DC
  Administered 2017-07-16 – 2017-07-17 (×3): 3 mL via RESPIRATORY_TRACT
  Filled 2017-07-16 (×3): qty 3

## 2017-07-16 MED ORDER — SENNOSIDES-DOCUSATE SODIUM 8.6-50 MG PO TABS
1.0000 | ORAL_TABLET | Freq: Two times a day (BID) | ORAL | Status: DC
Start: 2017-07-16 — End: 2017-07-17
  Administered 2017-07-16 – 2017-07-17 (×2): 1 via ORAL
  Filled 2017-07-16 (×2): qty 1

## 2017-07-16 MED ORDER — ENSURE ENLIVE PO LIQD
237.0000 mL | Freq: Three times a day (TID) | ORAL | Status: DC
  Administered 2017-07-16 – 2017-07-17 (×2): 237 mL via ORAL
  Filled 2017-07-16 (×3): qty 237

## 2017-07-16 MED ORDER — APIXABAN 5 MG PO TABS
10.0000 mg | ORAL_TABLET | Freq: Two times a day (BID) | ORAL | Status: DC
  Administered 2017-07-17 (×2): 10 mg via ORAL
  Filled 2017-07-16 (×2): qty 2

## 2017-07-16 MED ORDER — ALBUTEROL SULFATE (2.5 MG/3ML) 0.083% IN NEBU
2.5000 mg | INHALATION_SOLUTION | Freq: Four times a day (QID) | RESPIRATORY_TRACT | Status: DC | PRN
  Administered 2017-07-16: 2.5 mg via RESPIRATORY_TRACT
  Filled 2017-07-16: qty 3

## 2017-07-16 MED ORDER — METOPROLOL TARTRATE 25 MG PO TABS
12.5000 mg | ORAL_TABLET | Freq: Two times a day (BID) | ORAL | Status: DC
  Administered 2017-07-16 – 2017-07-17 (×2): 12.5 mg via ORAL
  Filled 2017-07-16 (×2): qty 1

## 2017-07-16 MED ORDER — IPRATROPIUM-ALBUTEROL 20-100 MCG/ACT IN AERS: 1.0000 | INHALATION_SPRAY | Freq: Four times a day (QID) | RESPIRATORY_TRACT | Status: DC

## 2017-07-16 MED ORDER — IPRATROPIUM-ALBUTEROL 0.5-2.5 (3) MG/3ML IN SOLN
3.0000 mL | Freq: Once | RESPIRATORY_TRACT | Status: AC
  Administered 2017-07-16: 3 mL via RESPIRATORY_TRACT
  Filled 2017-07-16: qty 3

## 2017-07-16 MED ORDER — SODIUM CHLORIDE 0.9 % IV BOLUS: 1000.0000 mL | Freq: Once | INTRAVENOUS | Status: DC

## 2017-07-16 MED ORDER — PANTOPRAZOLE SODIUM 40 MG PO TBEC
40.0000 mg | DELAYED_RELEASE_TABLET | Freq: Two times a day (BID) | ORAL | Status: DC
  Administered 2017-07-16 – 2017-07-17 (×2): 40 mg via ORAL
  Filled 2017-07-16 (×2): qty 1

## 2017-07-16 MED ORDER — ACETAMINOPHEN 500 MG PO TABS: 500.0000 mg | ORAL_TABLET | Freq: Two times a day (BID) | ORAL | Status: DC | PRN

## 2017-07-16 MED ORDER — METHYLPREDNISOLONE SODIUM SUCC 125 MG IJ SOLR
125.0000 mg | Freq: Once | INTRAMUSCULAR | Status: AC
  Administered 2017-07-16: 125 mg via INTRAVENOUS
  Filled 2017-07-16: qty 2

## 2017-07-16 MED ORDER — PREDNISONE 20 MG PO TABS
10.0000 mg | ORAL_TABLET | Freq: Every day | ORAL | Status: DC
  Administered 2017-07-17: 10 mg via ORAL
  Filled 2017-07-16: qty 1

## 2017-07-16 MED ORDER — IOPAMIDOL (ISOVUE-300) INJECTION 61%
100.0000 mL | Freq: Once | INTRAVENOUS | Status: AC | PRN
  Administered 2017-07-16: 80 mL via INTRAVENOUS

## 2017-07-16 MED ORDER — SODIUM CHLORIDE 0.9 % IV BOLUS
1000.0000 mL | Freq: Once | INTRAVENOUS | Status: AC
  Administered 2017-07-16: 1000 mL via INTRAVENOUS

## 2017-07-16 MED ORDER — FUROSEMIDE 40 MG PO TABS
40.0000 mg | ORAL_TABLET | Freq: Every day | ORAL | Status: DC
  Administered 2017-07-16 – 2017-07-17 (×2): 40 mg via ORAL
  Filled 2017-07-16 (×2): qty 1

## 2017-07-16 MED ORDER — OXYCODONE HCL 5 MG PO TABS: 10.0000 mg | ORAL_TABLET | ORAL | Status: DC | PRN

## 2017-07-16 MED ORDER — GABAPENTIN 100 MG PO CAPS
100.0000 mg | ORAL_CAPSULE | Freq: Three times a day (TID) | ORAL | Status: DC
  Administered 2017-07-16 – 2017-07-17 (×2): 100 mg via ORAL
  Filled 2017-07-16 (×2): qty 1

## 2017-07-16 MED ORDER — DEXAMETHASONE 4 MG PO TABS
4.0000 mg | ORAL_TABLET | Freq: Two times a day (BID) | ORAL | Status: DC
  Administered 2017-07-16 – 2017-07-17 (×2): 4 mg via ORAL
  Filled 2017-07-16 (×2): qty 1

## 2017-07-16 MED ORDER — IOPAMIDOL (ISOVUE-300) INJECTION 61%
INTRAVENOUS | Status: AC
  Filled 2017-07-16: qty 100

## 2017-07-16 MED ORDER — SUCRALFATE 1 GM/10ML PO SUSP
1.0000 g | Freq: Three times a day (TID) | ORAL | Status: DC
  Administered 2017-07-16 – 2017-07-17 (×2): 1 g via ORAL
  Filled 2017-07-16 (×2): qty 10

## 2017-07-16 NOTE — ED Triage Notes (Addendum)
Patient arrived via [facility] from [facility]. Patient c/o of shob and dizziness. Diarrhea this morning and nausea. Oxygen tank was empty on the scene per ems. Facility did not have any more 02 for patient to use. Patient [place] 02 as needed. 20g in left AC. Hx. Of lung cancer. Decreased lung sounds to the right left is audible and clear per ems

## 2017-07-16 NOTE — ED Notes (Signed)
Bed: WA20 Expected date:  Expected time:  Means of arrival:  Comments: EMS/cancer/weak

## 2017-07-16 NOTE — ED Provider Notes (Signed)
[facility] DEPT Provider Note   CSN: [id number] Arrival date & time: 07/16/17  1333     History   Chief Complaint Chief Complaint  Patient presents with  . Weakness    HPI [name] is a 57 y.o. male with a Past medical history of left lung adenocarcinoma stage IV, hypertension, COPD, who presents to ED for evaluation of 12-hour history of generalized weakness, shortness of breath.  He also complains of 2 episodes of diarrhea and nonbloody, nonbilious emesis since earlier this morning.  Patient states that he wears supplemental oxygen as needed but believes he was on the oxygen for about half an hour earlier this morning.  He called the facility but was told to come to the ED.  She reports generalized abdominal pain and chest pain.  Try using his home inhaler and nebulizer treatments with no improvement in his symptoms.  States that he has wheezing at baseline but "not this bad."  States his dry cough has improved.  Denies any hemoptysis.  She completed his chemotherapy session yesterday successfully.  Denies any fevers, hematemesis, hematochezia or melena, urinary symptoms.   HPI  Past Medical History:  Diagnosis Date  . Abdominal pain 06/04/2016  . Adenocarcinoma of left lung, stage 4 ([place]) 05/02/2016  . Alcohol abuse   . Bronchitis due to tobacco use ([place])   . Cancer ([place])    Lung  . COPD (chronic obstructive pulmonary disease) ([place])   . Dehydration 06/04/2016  . Encounter for antineoplastic chemotherapy 05/02/2016  . Gastritis   . Goals of care, counseling/discussion 05/02/2016  . Hematemesis   . HTN (hypertension) 10/30/2016  . Recurrent upper respiratory infection (URI)   . Seizures ([place]) 05/2011   new onset  . Shortness of breath     Patient Active Problem List   Diagnosis Date Noted  . Recurrent pleural effusion on right 07/15/2017  . Acute on chronic respiratory failure ([place]) 07/11/2017  . History of pulmonary embolism  07/11/2017  . PE (pulmonary thromboembolism) ([place]) 06/30/2017  . Pulmonary embolism ([place]) 06/30/2017  . AKI (acute kidney injury) ([place]) 06/25/2017  . Dyspnea 06/21/2017  . Chronic combined systolic (congestive) and diastolic (congestive) heart failure ([place])   . Constipation 06/07/2017  . Pain   . Acute on chronic respiratory failure with hypoxia ([place]) 05/28/2017  . Chronic respiratory failure with hypoxia ([place]) 04/01/2017  . Leucocytosis 03/16/2017  . CKD (chronic kidney disease), stage III ([place]) 03/12/2017  . Malnutrition of moderate degree 03/10/2017  . COPD exacerbation ([place]) 03/09/2017  . Macrocytic anemia 02/07/2017  . Severe malnutrition ([place]) 01/03/2017  . DOE (dyspnea on exertion) 12/30/2016  . Nausea   . Hypervolemia   . HCAP (healthcare-associated pneumonia) 12/19/2016  . SOB (shortness of breath) 12/19/2016  . Erosive gastropathy 12/18/2016  . Gastritis 12/18/2016  . Hematemesis 12/16/2016  . Intractable nausea and vomiting 12/16/2016  . HTN (hypertension) 10/30/2016  . Chronic obstructive pulmonary disease ([place]) 09/02/2016  . COPD (chronic obstructive pulmonary disease) ([place]) 08/19/2016  . Dehydration 06/04/2016  . Abdominal pain 06/04/2016  . 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 Adenocarcinoma of left lung, stage 4 ([place]) 05/02/2016  . Goals of care, counseling/discussion 05/02/2016  . Encounter for antineoplastic chemotherapy 05/02/2016  . Tobacco abuse 05/30/2011  . Alcohol abuse 05/30/2011  . Seizure ([place]) 05/28/2011    Past Surgical History:  Procedure Laterality Date  . ESOPHAGOGASTRODUODENOSCOPY (EGD) WITH PROPOFOL N/A 12/17/2016   Procedure: ESOPHAGOGASTRODUODENOSCOPY (EGD) WITH PROPOFOL;  Surgeon: Lucio Edward  T, MD;  Location: [facility] ENDOSCOPY;  Service: Endoscopy;  Laterality: N/A;  . IR FLUORO GUIDE PORT INSERTION RIGHT  05/13/2016  . IR US GUIDE VASC ACCESS RIGHT  05/13/2016  . NO PAST SURGERIES          Home Medications    Prior to Admission  medications   Medication Sig Start Date End Date Taking? Authorizing Provider  acetaminophen (TYLENOL) 500 MG tablet Take 1 tablet (500 mg total) by mouth 2 (two) times daily as needed for mild pain. 07/07/17  Yes Florencia Reasons, MD  albuterol (PROVENTIL) (2.5 MG/3ML) 0.083% nebulizer solution 1 neb every 4-6 hours as needed for wheezing and shortness of breath 04/01/17  Yes Parrett, Tammy S, NP  apixaban (ELIQUIS) 5 MG TABS tablet Take 1 tablet (5 mg total) by mouth 2 (two) times daily. Patient taking differently: Take 10 mg by mouth 2 (two) times daily.  07/08/17  Yes Florencia Reasons, MD  Chlorphen-Phenyleph-ASA (ALKA-SELTZER PLUS COLD PO) Take 2 tablets by mouth at bedtime as needed (COUGH).   Yes [provider]  dexamethasone (DECADRON) 4 MG tablet Take 4 mg by mouth 2 (two) times daily. The day before, the day of, and the day after chemotherapy every three weeks   Yes [provider]  Dextromethorphan-guaiFENesin (TUSSIN DM) 10-100 MG/5ML liquid Take 5 mLs by mouth every 12 (twelve) hours.   Yes [provider]  escitalopram (LEXAPRO) 5 MG tablet Take 1 tablet (5 mg total) by mouth at bedtime. 04/17/17  Yes Rosita Fire, MD  feeding supplement, ENSURE ENLIVE, (ENSURE ENLIVE) LIQD Take 237 mLs by mouth 3 (three) times daily between meals. 03/19/17  Yes Shelly Coss, MD  ferrous sulfate 325 (65 FE) MG tablet Take 1 tablet (325 mg total) by mouth 2 (two) times daily with a meal. 07/12/17  Yes Lavina Hamman, MD  folic acid (FOLVITE) 1 MG tablet Take 1 tablet (1 mg total) by mouth daily. 07/13/17  Yes Lavina Hamman, MD  furosemide (LASIX) 40 MG tablet Take 1 tablet (40 mg total) by mouth daily. 07/13/17  Yes Lavina Hamman, MD  gabapentin (NEURONTIN) 100 MG capsule Take 1 capsule (100 mg total) by mouth 3 (three) times daily. 07/12/17  Yes Lavina Hamman, MD  guaiFENesin (MUCINEX) 600 MG 12 hr tablet Take 1,200 mg by mouth 2 (two) times daily.    Yes [provider]    Ipratropium-Albuterol (COMBIVENT) 20-100 MCG/ACT AERS respimat Inhale 1 puff into the lungs every 6 (six) hours. 05/31/17  Yes Aline August, MD  loratadine (CLARITIN) 10 MG tablet Take 10 mg by mouth daily.   Yes [provider]  metoprolol tartrate (LOPRESSOR) 25 MG tablet Take 12.5 mg by mouth 2 (two) times daily.    Yes [provider]  ondansetron (ZOFRAN-ODT) 4 MG disintegrating tablet Take 4 mg by mouth every 8 (eight) hours as needed for nausea or vomiting.   Yes [provider]  Oxycodone HCl 10 MG TABS Take 1 tablet (10 mg total) by mouth every 4 (four) hours as needed for up to 15 doses (pain/shortness of breath). 07/13/17  Yes McDonald, Mia A, PA-C  pantoprazole (PROTONIX) 40 MG tablet Take 1 tablet (40 mg total) by mouth 2 (two) times daily. 12/18/16  Yes Eugenie Filler, MD  predniSONE (DELTASONE) 10 MG tablet Take 10 mg by mouth daily with breakfast.   Yes [provider]  senna-docusate (SENOKOT-S) 8.6-50 MG tablet Take 2 tablets by mouth 2 (two)  times daily. Patient taking differently: Take 1 tablet by mouth 2 (two) times daily.  07/12/17  Yes Lavina Hamman, MD  sucralfate (CARAFATE) 1 GM/10ML suspension Take 10 mLs (1 g total) by mouth 4 (four) times daily -  with meals and at bedtime. 07/12/17  Yes Lavina Hamman, MD  Fluticasone-Umeclidin-Vilant (TRELEGY ELLIPTA) 100-62.5-25 MCG/INH AEPB Inhale 1 puff into the lungs daily. Patient not taking: Reported on 07/16/2017 05/31/17   Aline August, MD    Family History Family History  Problem Relation Age of Onset  . Cancer Father   . Diabetes Mellitus II Mother     Social History Social History   Tobacco Use  . Smoking status: Current Some Day Smoker    Packs/day: 0.10    Years: 30.00    Pack years: 3.00    Types: Cigarettes  . Smokeless tobacco: Never Used  . Tobacco comment: 1-2 cig a day   Substance Use Topics  . Alcohol use: No    Frequency: Never  . Drug use: No      Allergies   Patient has no known allergies.   Review of Systems Review of Systems  Constitutional: Negative for appetite change, chills and fever.  HENT: Negative for ear pain, rhinorrhea, sneezing and sore throat.   Eyes: Negative for photophobia and visual disturbance.  Respiratory: Positive for shortness of breath. Negative for cough, chest tightness and wheezing.   Cardiovascular: Positive for chest pain. Negative for palpitations.  Gastrointestinal: Positive for abdominal pain, diarrhea, nausea and vomiting. Negative for blood in stool and constipation.  Genitourinary: Negative for dysuria, hematuria and urgency.  Musculoskeletal: Negative for myalgias.  Skin: Negative for rash.  Neurological: Positive for weakness. Negative for dizziness and light-headedness.     Physical Exam Updated Vital Signs BP (!) 145/85   Pulse 61   Temp (!) 97.5 F (36.4 C) (Oral)   Resp (!) 23   SpO2 100%   Physical Exam  Constitutional: He appears well-developed and well-nourished. No distress.  Oxygen being delivered via nasal cannula.  HENT:  Head: Normocephalic and atraumatic.  Nose: Nose normal.  Eyes: Conjunctivae and EOM are normal. Right eye exhibits no discharge. Left eye exhibits no discharge. No scleral icterus.  Neck: Normal range of motion. Neck supple.  Cardiovascular: Normal rate, regular rhythm, normal heart sounds and intact distal pulses. Exam reveals no gallop and no friction rub.  No murmur heard. Pulmonary/Chest: Accessory muscle usage present. No respiratory distress. He has decreased breath sounds in the right middle field, the right lower field, the left middle field and the left lower field. He has wheezes in the right middle field, the right lower field, the left middle field and the left lower field.  Abdominal: Soft. Bowel sounds are normal. He exhibits no distension. There is generalized tenderness and tenderness in the epigastric area. There is no rebound and  no guarding.  Musculoskeletal: Normal range of motion. He exhibits no edema.  Neurological: He is alert. He exhibits normal muscle tone. Coordination normal.  Skin: Skin is warm and dry. No rash noted.  Psychiatric: He has a normal mood and affect.  Nursing note and vitals reviewed.    ED Treatments / Results  Labs (all labs ordered are listed, but only abnormal results are displayed) Labs Reviewed  COMPREHENSIVE METABOLIC PANEL - Abnormal; Notable for the following components:      Result Value   BUN 30 (*)    Creatinine, Ser 1.54 (*)    Total  Protein 6.2 (*)    Albumin 3.1 (*)    GFR calc non Af Amer 48 (*)    GFR calc Af Amer 56 (*)    All other components within normal limits  CBC WITH DIFFERENTIAL/PLATELET - Abnormal; Notable for the following components:   WBC 18.0 (*)    RBC 2.77 (*)    Hemoglobin 9.2 (*)    HCT 28.4 (*)    MCV 102.5 (*)    RDW 18.4 (*)    Neutro Abs 16.4 (*)    All other components within normal limits  URINALYSIS, ROUTINE W REFLEX MICROSCOPIC - Abnormal; Notable for the following components:   Bilirubin Urine SMALL (*)    All other components within normal limits  LIPASE, BLOOD  I-STAT TROPONIN, ED  I-STAT TROPONIN, ED    EKG EKG Interpretation  Date/Time:  Wednesday July 16 2017 15:25:45 EDT Ventricular Rate:  82 PR Interval:    QRS Duration: 83 QT Interval:  403 QTC Calculation: 471 R Axis:   103 Text Interpretation:  Sinus rhythm Abnormal lateral Q waves Probable anterior infarct, old When compared to prior, t waves upright in lead V3 compared to prior.  No STEMI Confirmed by Antony Blackbird [phone]) on 07/16/2017 4:13:57 PM   Radiology Dg Chest 2 View  Result Date: 07/16/2017 CLINICAL DATA:  Weakness, dyspnea EXAM: CHEST - 2 VIEW COMPARISON:  07/13/2017 chest radiograph. FINDINGS: Right subclavian MediPort terminates at the cavoatrial junction. Stable cardiomediastinal silhouette with normal heart size. No pneumothorax. Stable blunting  of the right greater than left costophrenic angles. No pulmonary edema. Severe emphysema, asymmetrically prominent in the left lung, with lung hyperinflation. No acute consolidative airspace disease. Stable mild scarring at the lung bases. IMPRESSION: 1. No acute cardiopulmonary disease. 2. Lung hyperinflation and severe emphysema asymmetrically involving the left lung, compatible with COPD. 3. Stable blunting of the right greater than left costophrenic angles, which could represent small pleural effusions and/or pleural-parenchymal scarring. Electronically Signed   By: Ilona Sorrel M.D.   On: 07/16/2017 16:28   Ct Abdomen Pelvis W Contrast  Result Date: 07/16/2017 CLINICAL DATA:  Shortness of breath and dizziness. Diarrhea and nausea. EXAM: CT ABDOMEN AND PELVIS WITH CONTRAST TECHNIQUE: Multidetector CT imaging of the abdomen and pelvis was performed using the standard protocol following bolus administration of intravenous contrast. CONTRAST:  46mL ISOVUE-300 IOPAMIDOL (ISOVUE-300) INJECTION 61% COMPARISON:  CT abdomen pelvis 07/03/2017 FINDINGS: LOWER CHEST: Small pleural effusions, right greater than left. Bibasilar atelectasis. HEPATOBILIARY: Focal fat deposition along the course of the left portal vein. No focal liver lesion otherwise. No biliary dilatation. Gallbladder is partially contracted. PANCREAS: Normal parenchymal contours without ductal dilatation. No peripancreatic fluid collection. SPLEEN: Normal. ADRENALS/URINARY TRACT: --Adrenal glands: Normal. --Right kidney/ureter: No hydronephrosis, nephroureterolithiasis, perinephric stranding or solid renal mass. --Left kidney/ureter: No hydronephrosis, nephroureterolithiasis, perinephric stranding or solid renal mass. --Urinary bladder: Normal for degree of distention STOMACH/BOWEL: --Stomach/Duodenum: Persistent esophageal wall thickening near the gastroesophageal junction. --Small bowel: No dilatation or inflammation. --Colon: No focal abnormality.  --Appendix: Not visualized. No right lower quadrant inflammation or free fluid. VASCULAR/LYMPHATIC: Atherosclerotic calcification is present within the non-aneurysmal abdominal aorta, without hemodynamically significant stenosis. The portal vein, splenic vein, superior mesenteric vein and IVC are patent. No abdominal or pelvic lymphadenopathy. REPRODUCTIVE: Normal prostate and seminal vesicles. MUSCULOSKELETAL. Unchanged lucent lesion of the left L3 vertebral body. T12 vertebral body remains sclerotic. OTHER: None. IMPRESSION: 1. No acute abnormality of the abdomen or pelvis. 2. Small pleural effusions, right larger than  left but the right effusion is decreased in size from 07/03/2017. 3.  Aortic Atherosclerosis (ICD10-I70.0). 4. Unchanged sclerotic appearance of T12 and lucent lesion of the inferior endplate of L3. 5. Persistent wall thickening at the gastroesophageal junction. Electronically Signed   By: Ulyses Jarred M.D.   On: 07/16/2017 17:32    Procedures Procedures (including critical care time)  Medications Ordered in ED Medications  iopamidol (ISOVUE-300) 61 % injection (has no administration in time range)  acetaminophen (TYLENOL) tablet 500 mg (has no administration in time range)  albuterol (PROVENTIL) (2.5 MG/3ML) 0.083% nebulizer solution 2.5 mg (2.5 mg Nebulization Given 07/16/17 2110)  dexamethasone (DECADRON) tablet 4 mg (has no administration in time range)  feeding supplement (ENSURE ENLIVE) (ENSURE ENLIVE) liquid 237 mL (237 mLs Oral Given 07/16/17 2125)  furosemide (LASIX) tablet 40 mg (40 mg Oral Given 07/16/17 2019)  gabapentin (NEURONTIN) capsule 100 mg (has no administration in time range)  metoprolol tartrate (LOPRESSOR) tablet 12.5 mg (has no administration in time range)  oxyCODONE (Oxy IR/ROXICODONE) immediate release tablet 10 mg (has no administration in time range)  pantoprazole (PROTONIX) EC tablet 40 mg (has no administration in time range)  predniSONE (DELTASONE)  tablet 10 mg (has no administration in time range)  senna-docusate (Senokot-S) tablet 1 tablet (has no administration in time range)  sucralfate (CARAFATE) 1 GM/10ML suspension 1 g (has no administration in time range)  apixaban (ELIQUIS) tablet 10 mg (has no administration in time range)  ipratropium-albuterol (DUONEB) 0.5-2.5 (3) MG/3ML nebulizer solution 3 mL (3 mLs Nebulization Given 07/16/17 2122)  ipratropium-albuterol (DUONEB) 0.5-2.5 (3) MG/3ML nebulizer solution 3 mL (3 mLs Nebulization Given 07/16/17 1539)  sodium chloride 0.9 % bolus 1,000 mL (0 mLs Intravenous Stopped 07/16/17 1750)  iopamidol (ISOVUE-300) 61 % injection 100 mL (80 mLs Intravenous Contrast Given 07/16/17 1643)  ipratropium-albuterol (DUONEB) 0.5-2.5 (3) MG/3ML nebulizer solution 3 mL (3 mLs Nebulization Given 07/16/17 1747)  methylPREDNISolone sodium succinate (SOLU-MEDROL) 125 mg/2 mL injection 125 mg (125 mg Intravenous Given 07/16/17 1747)     Initial Impression / Assessment and Plan / ED Course  I have reviewed the triage vital signs and the nursing notes.  Pertinent labs & imaging results that were available during my care of the patient were reviewed by me and considered in my medical decision making (see chart for details).  Clinical Course as of Jul 16 2320  Wed Jul 16, 2017  1900 Spoke to case manager who recommends evaluation in the AM for patient's supplemental oxygen needs. Due to current time, will have to wait till morning for possible refill of his oxygen tank.   [HK]  [id number] I spoke to the patient about his oxygen situation at the ALF that he resides at. He is frustrated because he states that he is the only one that is able to refill his oxygen tank from a machine that he has.  He states that "they are not doing anything for me there."  I asked him if he still had oxygen in the machine that he uses to refill his pain.  He states that "I do not know but I hope so."   [HK]  1921 I spoke to nurse at [facility]. She  states that patient has oxygen that he uses but "it's a small tank." Unsure if he has any oxygen in the machine. She feels that he should have a case management consult to possibly get him a bigger tank to use.   [HK]    Clinical Course  User Index [HK] Delia Heady, PA-C    57 year old male with past medical history of stage IV left lung adenocarcinoma, hypertension, COPD presents for 12-hour history of generalized weakness, shortness of breath, generalized abdominal pain, diarrhea and emesis.  He wears a supplemental oxygen as needed but states that the assisted living facility that he was at ran out of oxygen.  States that they are not helping him to get more oxygen in his tank.  Patient has a history of PE that was diagnosed earlier in the month and has been taking his Eliquis as directed.  On physical exam he has generalized abdominal tenderness to palpation diffuse wheezing in bilateral lung fields noted.  He is on 3 L of oxygen here and satting at 100%.  He was also mildly tachypneic.  CMP shows elevation in creatinine to 1.54.  Lipase unremarkable.  CBC with leukocytosis at 18.  Troponin unremarkable.  Urinalysis unremarkable.  Chest x-ray showed possible mild pleural effusions with no consolidation noted.  He is afebrile.  CT of abdomen and pelvis shows no acute process. EKG is nonischemic. Patient given 2 DuoNeb treatments here with significant improvement in his symptoms.  I spoke to the assisted living facility nurse there and states that they are unsure if there is any oxygen left in the machine that he uses to refill is taking.  Patient has shown some frustration with the facility that he lives at stating that they do not help him and that he is the one that has to refill his oxygen tank.  The nurse also mentioned that his pain is small and that he would probably benefit from a bigger tank as the current one will only lasting for about 2 hours.  Spoke to case management who states that they will  get arrangement for refill of oxygen tank tomorrow in the morning.  Until then, will board and reorder home medications.   Final Clinical Impressions(s) / ED Diagnoses   Final diagnoses:  COPD exacerbation [facility])    ED Discharge Orders    None       Delia Heady, PA-C 07/16/17 2322    Tegeler, Gwenyth Allegra, MD 07/17/17 [phone]

## 2017-07-16 NOTE — ED Triage Notes (Signed)
[address]

## 2017-07-16 NOTE — Progress Notes (Addendum)
Consult request has been received. CSW attempting to follow up at present time.  CSW spoke with EDP who stated that pt, who has A hx of lung cancer who ran out of oxygen was BIB by EMS from "[facility]" who stated that facility "Facility did not have any more 02 for patient to use. Patient [place] 02 as needed. 20g in left AC. Hx. Of lung cancer. Decreased lung sounds to the right left is audible and clear" per ems.  CSW notified CM and updated EPD who will place a consult for CM with an order for face-to-face and an order for medical equipment.  [name]. Raynald Rouillard, LCSW, LCAS, CSI Clinical Social Worker Ph: [phone]

## 2017-07-16 NOTE — ED Notes (Signed)
Patient given a sandwich and a coke.

## 2017-07-17 LAB — I-STAT TROPONIN, ED: Troponin i, poc: 0.01 ng/mL (ref 0.00–0.08)

## 2017-07-17 NOTE — ED Notes (Signed)
[facility] called for transport back to facility

## 2017-07-17 NOTE — ED Notes (Signed)
SW to call CM r/t home O2

## 2017-07-17 NOTE — Care Management Note (Signed)
Case Management Note  Patient Details  Name: [name] MRN: [id number] Date of Birth: 1960-06-28  CM consulted for O2 needs with reports that pt ran out of O2 at his residence, [facility].  CM spoke with Santiago Glad with [facility] who advised pt has a O2 concentrator and should never run out of O2.  She advised she will bring a tank down that will last 4 hours and 45 minutes which should provide enough time to trouble shoot the issue.  Pt/ALF staff will need to call [facility] DME on pt's arrival to run through the over the phone trouble shooting process.  CM updated Dr. Myrene Buddy, Vikki Ports, RN, and [place], Agricultural consultant.  Pt will return via [facility] and then start O2 tank use on arrival to the facility.  No further CM needs noted at this time.  Expected Discharge Date:   07/17/2017               Expected Discharge Plan:  Home/Self Care  In-House Referral:  Clinical Social Work  Discharge planning Services  CM Consult  Post Acute Care Choice:  Durable Medical Equipment Choice offered to:  Patient  DME Arranged:  Oxygen DME Agency:  [facility].  Status of Service:  Completed, signed off  Rae Mar, RN 07/17/2017, 9:28 AM

## 2017-07-18 ENCOUNTER — Institutional Professional Consult (permissible substitution): Payer: Medicaid Other | Admitting: Cardiothoracic Surgery

## 2017-07-18 ENCOUNTER — Encounter: Payer: Self-pay | Admitting: Cardiothoracic Surgery

## 2017-07-18 ENCOUNTER — Ambulatory Visit
Admission: RE | Admit: 2017-07-18 | Discharge: 2017-07-18 | Disposition: A | Discharge status: Home / Self Care | Payer: Medicaid Other | Source: Ambulatory Visit | Attending: Cardiothoracic Surgery | Admitting: Cardiothoracic Surgery

## 2017-07-18 ENCOUNTER — Other Ambulatory Visit: Payer: Self-pay

## 2017-07-18 VITALS — BP 138/84 | HR 104 | Resp 18 | Ht 71.0 in | Wt 118.0 lb

## 2017-07-18 DIAGNOSIS — C3492 Malignant neoplasm of unspecified part of left bronchus or lung: Secondary | ICD-10-CM

## 2017-07-18 DIAGNOSIS — J90 Pleural effusion, not elsewhere classified: Secondary | ICD-10-CM | POA: Diagnosis not present

## 2017-07-18 NOTE — Progress Notes (Signed)
[address]       [place] [address]             [id number]                    Arshdeep A Kaczmarek Severy Medical Record #[id number] Date of Birth: 28-Feb-1960  Referring: [name], MD Primary Care: [name], MD Primary Cardiologist: No primary care provider on file.  Chief Complaint:    Chief Complaint  Patient presents with  . Pleural Effusion    new patient, pleurX evaluation    History of Present Illness:    [name] 57 y.o. male   was referred to the office from the hospital for placement of a Pleurx catheter with stage IV lung cancer.  On arrival from the nursing home to the office the patient was in some respiratory distress.  Wheezing.  On home oxygen with an almost empty tank.    Cancer Staging No matching staging information was found for the patient.     Current Activity/ Functional Status:  Patient is not independent with mobility/ambulation, transfers, ADL's, IADL's.   Zubrod Score: At the time of surgery this patient's most appropriate activity status/level should be described as: []     0    Normal activity, no symptoms []     1    Restricted in physical strenuous activity but ambulatory, able to do out light work []     2    Ambulatory and capable of self care, unable to do work activities, up and about               >50 % of waking hours                              []     3    Only limited self care, in bed greater than 50% of waking hours [x]     4    Completely disabled, no self care, confined to bed or chair []     5    Moribund   Past Medical History:  Diagnosis Date  . Abdominal pain 06/04/2016  . Adenocarcinoma of left lung, stage 4 ([place]) 05/02/2016  . Alcohol abuse   . Bronchitis due to tobacco use ([place])   . Cancer ([place])    Lung  . COPD (chronic obstructive pulmonary disease) ([place])   . Dehydration 06/04/2016  . Encounter for antineoplastic chemotherapy 05/02/2016  . Gastritis   . Goals of care,  counseling/discussion 05/02/2016  . Hematemesis   . HTN (hypertension) 10/30/2016  . Recurrent upper respiratory infection (URI)   . Seizures ([place]) 05/2011   new onset  . Shortness of breath     Past Surgical History:  Procedure Laterality Date  . ESOPHAGOGASTRODUODENOSCOPY (EGD) WITH PROPOFOL N/A 12/17/2016   Procedure: ESOPHAGOGASTRODUODENOSCOPY (EGD) WITH PROPOFOL;  Surgeon: Ladene Artist, MD;  Location: [facility];  Service: Endoscopy;  Laterality: N/A;  . IR FLUORO GUIDE PORT INSERTION RIGHT  05/13/2016  . IR US GUIDE VASC ACCESS RIGHT  05/13/2016  . NO PAST SURGERIES      Family History  Problem Relation Age of Onset  . Cancer Father   . Diabetes Mellitus II Mother      Social History   Tobacco Use  Smoking Status Current Some Day Smoker  . Packs/day: 0.10  . Years: 30.00  . Pack years: 3.00  .  Types: Cigarettes  Smokeless Tobacco Never Used  Tobacco Comment   1 cig a week    Social History   Substance and Sexual Activity  Alcohol Use No  . Frequency: Never     No Known Allergies  Current Outpatient Medications  Medication Sig Dispense Refill  . acetaminophen (TYLENOL) 500 MG tablet Take 1 tablet (500 mg total) by mouth 2 (two) times daily as needed for mild pain. 30 tablet 0  . albuterol (PROVENTIL) (2.5 MG/3ML) 0.083% nebulizer solution 1 neb every 4-6 hours as needed for wheezing and shortness of breath 360 mL 12  . apixaban (ELIQUIS) 5 MG TABS tablet Take 1 tablet (5 mg total) by mouth 2 (two) times daily. (Patient taking differently: Take 10 mg by mouth 2 (two) times daily. ) 60 tablet 0  . Chlorphen-Phenyleph-ASA (ALKA-SELTZER PLUS COLD PO) Take 2 tablets by mouth at bedtime as needed (COUGH).    Marland Kitchen dexamethasone (DECADRON) 4 MG tablet Take 4 mg by mouth 2 (two) times daily. The day before, the day of, and the day after chemotherapy every three weeks    . Dextromethorphan-guaiFENesin (TUSSIN DM) 10-100 MG/5ML liquid Take 5 mLs by mouth every 12  (twelve) hours.    Marland Kitchen escitalopram (LEXAPRO) 5 MG tablet Take 1 tablet (5 mg total) by mouth at bedtime. 30 tablet 0  . feeding supplement, ENSURE ENLIVE, (ENSURE ENLIVE) LIQD Take 237 mLs by mouth 3 (three) times daily between meals. 237 mL 2  . ferrous sulfate 325 (65 FE) MG tablet Take 1 tablet (325 mg total) by mouth 2 (two) times daily with a meal. 60 tablet 0  . Fluticasone-Umeclidin-Vilant (TRELEGY ELLIPTA) 100-62.5-25 MCG/INH AEPB Inhale 1 puff into the lungs daily. 1 each 0  . folic acid (FOLVITE) 1 MG tablet Take 1 tablet (1 mg total) by mouth daily. 30 tablet 0  . furosemide (LASIX) 40 MG tablet Take 1 tablet (40 mg total) by mouth daily. 30 tablet 0  . gabapentin (NEURONTIN) 100 MG capsule Take 1 capsule (100 mg total) by mouth 3 (three) times daily. 90 capsule 0  . guaiFENesin (MUCINEX) 600 MG 12 hr tablet Take 1,200 mg by mouth 2 (two) times daily.     . Ipratropium-Albuterol (COMBIVENT) 20-100 MCG/ACT AERS respimat Inhale 1 puff into the lungs every 6 (six) hours. 1 Inhaler 0  . loratadine (CLARITIN) 10 MG tablet Take 10 mg by mouth daily.    . metoprolol tartrate (LOPRESSOR) 25 MG tablet Take 12.5 mg by mouth 2 (two) times daily.     . ondansetron (ZOFRAN-ODT) 4 MG disintegrating tablet Take 4 mg by mouth every 8 (eight) hours as needed for nausea or vomiting.    . Oxycodone HCl 10 MG TABS Take 1 tablet (10 mg total) by mouth every 4 (four) hours as needed for up to 15 doses (pain/shortness of breath). 15 tablet 0  . pantoprazole (PROTONIX) 40 MG tablet Take 1 tablet (40 mg total) by mouth 2 (two) times daily. 60 tablet 1  . predniSONE (DELTASONE) 10 MG tablet Take 10 mg by mouth daily with breakfast.    . senna-docusate (SENOKOT-S) 8.6-50 MG tablet Take 2 tablets by mouth 2 (two) times daily. (Patient taking differently: Take 1 tablet by mouth 2 (two) times daily. ) 30 tablet 0  . sucralfate (CARAFATE) 1 GM/10ML suspension Take 10 mLs (1 g total) by mouth 4 (four) times daily -  with  meals and at bedtime. 420 mL 0   No current facility-administered medications  for this visit.     Pertinent items are noted in HPI.   Review of Systems:     Cardiac Review of Systems: [Y] = yes  or   [ N ] = no   Chest Pain [   y ]  Resting SOB [  y ] Exertional SOB  Blue.Reese  ]  Orthopnea [ y ]   Pedal Edema [ h  ]    Palpitations Drusilla.Gab  ] Syncope  [n  ]   Presyncope [ y  ]   General Review of Systems: [Y] = yes [  ]=no Constitional: recent weight change Drusilla.Gab  ];  Wt loss over the last 3 months [   ] anorexia [ h ]; fatigue [ h ]; nausea Drusilla.Gab  ]; night sweats [  ]; fever [  ]; or chills [  ];           Eye : blurred vision [  ]; diplopia [   ]; vision changes [  ];  Amaurosis fugax[  ]; Resp: cough [ h ];  wheezing[ h ];  hemoptysis[  ]; shortness of breath[ h ]; paroxysmal nocturnal dyspnea[ h ]; dyspnea on exertion[h  ]; or orthopnea[  ];  GI:  gallstones[  ], vomiting[  ];  dysphagia[  ]; melena[  ];  hematochezia [  ]; heartburn[  ];   Hx of  Colonoscopy[  ]; GU: kidney stones [  ]; hematuria[  ];   dysuria [  ];  nocturia[  ];  history of     obstruction [  ]; urinary frequency [  ]             Skin: rash, swelling[  ];, hair loss[  ];  peripheral edema[  ];  or itching[  ]; Musculosketetal: myalgias[  ];  joint swelling[  ];  joint erythema[  ];  joint pain[  ];  back pain[  ];  Heme/Lymph: bruising[  ];  bleeding[  ];  anemia[  ];  Neuro: TIA[  ];  headaches[  ];  stroke[  ];  vertigo[  ];  seizures[  ];   paresthesias[ y ];  difficulty walking[ y ];  Psych:depression[  ]; anxiety[  ];  Endocrine: diabetes[  ];  thyroid dysfunction[  ];  Immunizations: Flu up to date [  ]; Pneumococcal up to date [  ];  Other:    PHYSICAL EXAMINATION: BP 138/84 (BP Location: Left Arm, Patient Position: Sitting, Cuff Size: Normal)   Pulse (!) 104   Resp 18   Ht 5\' 11"  (1.803 m)   Wt 118 lb (53.5 kg)   SpO2 98% Comment: 3 LNC  BMI 16.46 kg/m  General appearance: cooperative, appears older than stated  age, cachectic and moderate distress Head: Normocephalic, without obvious abnormality, atraumatic Neck: no adenopathy, no carotid bruit, no JVD, supple, symmetrical, trachea midline and thyroid not enlarged, symmetric, no tenderness/mass/nodules Lymph nodes: Cervical, supraclavicular, and axillary nodes normal. Resp: wheezes bilaterally Back: symmetric, no curvature. ROM normal. No CVA tenderness. Cardio: regular rate and rhythm, S1, S2 normal, no murmur, click, rub or gallop GI: soft, non-tender; bowel sounds normal; no masses,  no organomegaly Extremities: extremities normal, atraumatic, no cyanosis or edema and Homans sign is negative, no sign of DVT Neurologic: Grossly normal  Diagnostic Studies & Laboratory data:     Recent Radiology Findings:  Dg Chest 2 View  Result Date: 07/18/2017 CLINICAL DATA:  Lung cancer. EXAM: CHEST - 2  VIEW COMPARISON:  Chest x-ray 07/16/2017, 05/28/2017, 03/17/2017. CT 03/13/2017. FINDINGS: PowerPort catheter noted with tip over right atrium. Cardiomegaly with normal pulmonary vascularity. Hyperexpansion of both lung fields noted consistent with COPD. Mild mid lung field basilar subsegmental atelectasis. Unchanged bilateral basilar pleural thickening consistent with persistent effusions and or scarring. IMPRESSION: 1.  PowerPort catheter noted in stable position. 2. COPD. Mild subsegmental atelectasis noted the mid lung fields and both bases. Mild bilateral basilar pleural thickening consistent with persistent effusions and/or scarring. No acute abnormality identified. 3.  Stable cardiomegaly.  No pulmonary venous congestion. Electronically Signed   By: Marcello Moores  Register   On: 07/18/2017 14:02   I have independently reviewed the above radiology studies  and reviewed the findings with the patient.   Ct Abdomen Pelvis Wo Contrast  Result Date: 07/03/2017 CLINICAL DATA:  Abdominal distension, worsening shortness of breath, lung cancer, pulmonary embolism, COPD, CHF  EXAM: CT ABDOMEN AND PELVIS WITHOUT CONTRAST TECHNIQUE: Multidetector CT imaging of the abdomen and pelvis was performed following the standard protocol without IV contrast. Sagittal and coronal MPR images reconstructed from axial data set. Patient drank dilute oral contrast for exam COMPARISON:  12/16/2016 FINDINGS: Lower chest: Moderate RIGHT pleural effusion. Peribronchial thickening, more pronounced in LEFT lower lobe with question mucous plugging emphysematous changes at LEFT base. Compressive atelectasis RIGHT lower lobe. Minimal LEFT pleural fluid. Hepatobiliary: Gallbladder and liver unremarkable Pancreas: Normal appearance Spleen: Normal appearance Adrenals/Urinary Tract: Adrenal glands, kidneys, ureters, and bladder normal appearance Stomach/Bowel: Visualization of proximal appendix in RIGHT pelvis, grossly unremarkable. Stool throughout colon. Large and small bowel loops normal appearance. Distended stomach with wall thickening of antrum and pylorus extending in the duodenal bulb could reflect gastritis. Vascular/Lymphatic: Atherosclerotic calcifications aorta and iliac arteries. Tip of central venous catheter at cavoatrial junction. No adenopathy. Reproductive: Minimal prostatic enlargement. Other: Low-attenuation free fluid in pelvis and minimally at scattered in upper abdomen. No free air. No hernia. Musculoskeletal: Sclerotic T12 vertebral body again identified, question due to osseous metastatic disease. Additional sclerotic focus at the inferior L2 vertebral body. Lytic lesion with sclerotic rim at inferior L3 unchanged. Metallic foreign body at inferior medial LEFT buttock. IMPRESSION: RIGHT pleural effusion and basilar atelectasis. Minimal LEFT pleural effusion and scattered ascites greatest in pelvis. Wall thickening of distal gastric antrum pylorus, could reflect gastritis. Suspected sclerotic metastatic lesions of T12 and L2 with an additional chronic well-circumscribed lytic focus at inferior  L3. Electronically Signed   By: Lavonia Dana M.D.   On: 07/03/2017 13:02   Dg Chest 1 View  Result Date: 07/11/2017 CLINICAL DATA:  Status post thoracentesis on the right EXAM: CHEST  1 VIEW COMPARISON:  07/11/2017 FINDINGS: Cardiac shadows within normal limits. Right chest wall port is again seen and stable. Interval right thoracentesis has been performed. A minimal basilar pneumothorax is noted. This is likely related incomplete re-expansion of the right lower lobe. Correlation with patient's symptomatology is recommended. No other focal abnormality is seen. Stable left pleural effusion is noted. IMPRESSION: Small right basilar pneumothorax. This is likely related incomplete re-expansion of the lower lobe. Correlate with patient's symptomatology. Electronically Signed   By: Inez Catalina M.D.   On: 07/11/2017 13:47   Dg Chest 1 View  Result Date: 07/05/2017 CLINICAL DATA:  S/p right sided thoracentesis today, 1.2 liters removed. EXAM: CHEST  1 VIEW COMPARISON:  Chest x-ray dated 07/04/2017. FINDINGS: Improved aeration at the RIGHT lung base, significantly decreased amount of pleural effusion compared to the earlier CT of 06/30/2017. Stable  small LEFT pleural effusion and/or atelectasis. No pneumothorax seen. Heart size and mediastinal contours are stable. RIGHT chest wall Port-A-Cath is stable in position with tip overlying the RIGHT atrium. No acute or suspicious osseous finding. IMPRESSION: Status post thoracentesis.  No pneumothorax. Electronically Signed   By: Franki Cabot M.D.   On: 07/05/2017 14:06   Dg Chest 2 View  Result Date: 07/16/2017 CLINICAL DATA:  Weakness, dyspnea EXAM: CHEST - 2 VIEW COMPARISON:  07/13/2017 chest radiograph. FINDINGS: Right subclavian MediPort terminates at the cavoatrial junction. Stable cardiomediastinal silhouette with normal heart size. No pneumothorax. Stable blunting of the right greater than left costophrenic angles. No pulmonary edema. Severe emphysema,  asymmetrically prominent in the left lung, with lung hyperinflation. No acute consolidative airspace disease. Stable mild scarring at the lung bases. IMPRESSION: 1. No acute cardiopulmonary disease. 2. Lung hyperinflation and severe emphysema asymmetrically involving the left lung, compatible with COPD. 3. Stable blunting of the right greater than left costophrenic angles, which could represent small pleural effusions and/or pleural-parenchymal scarring. Electronically Signed   By: Ilona Sorrel M.D.   On: 07/16/2017 16:28   Dg Chest 2 View  Result Date: 07/13/2017 CLINICAL DATA:  57 year old male with dyspnea and chest pain. EXAM: CHEST - 2 VIEW COMPARISON:  Chest radiograph dated 07/12/2017 FINDINGS: Right-sided Port-A-Cath with tip in stable position. There is severe emphysematous changes of the lungs. Faint nodular density in the left upper lobe better seen on the prior CT. There is no pneumothorax. Small stable bilateral pleural effusions, right greater left. The cardiac silhouette is within normal limits. No acute osseous pathology. IMPRESSION: 1. No acute cardiopulmonary process. 2. Severe emphysema and stable bilateral pleural effusions with Electronically Signed   By: Anner Crete M.D.   On: 07/13/2017 21:53   Dg Chest 2 View  Result Date: 07/11/2017 CLINICAL DATA:  Shortness of breath.  History of lung cancer. EXAM: CHEST - 2 VIEW COMPARISON:  Chest 07/05/2017, CT chest 06/30/2017 FINDINGS: Heart size and pulmonary vascularity are normal. Focal scarring in the left suprahilar and left upper lung region corresponding to lesion better seen at previous CT chest. Small bilateral pleural effusions demonstrating mild increased since previous study. There is developing right perihilar and basilar infiltration which could represent asymmetrical edema or pneumonia. No pneumothorax. Power port type central venous catheter with tip over the cavoatrial junction region. IMPRESSION: Small but increasing  bilateral pleural effusions. Increasing infiltration or edema in the right perihilar and basilar lung regions. Focal scarring in the left upper lung is identified, corresponding to changes better seen on previous CT scan. Electronically Signed   By: Lucienne Capers M.D.   On: 07/11/2017 00:30   Dg Chest 2 View  Result Date: 07/04/2017 CLINICAL DATA:  Shortness of breath EXAM: CHEST - 2 VIEW COMPARISON:  July 04, 2017 FINDINGS: A right Port-A-Cath is in good position. Bilateral pleural effusions are stable. Stable left upper lobe mass, unchanged. Increased lucency in the left lung compared to the right is stable. Increased haziness on the right is stable compared to the left. No change in the cardiomediastinal silhouette. IMPRESSION: No interval change in bilateral pleural effusions, a left upper lobe mass, the right Port-A-Cath, or haziness in the right chest. Electronically Signed   By: Dorise Bullion III M.D   On: 07/04/2017 22:33   Dg Chest 2 View  Result Date: 07/04/2017 CLINICAL DATA:  Worsening shortness of Breath EXAM: CHEST - 2 VIEW COMPARISON:  06/30/2017 FINDINGS: Cardiac shadow is stable. Right chest  wall port is again seen. Right pleural effusion is seen and stable from the previous CT examination. Stable left upper lobe mass lesion is noted. No acute bony abnormality is noted. IMPRESSION: Stable left upper lobe mass. Right-sided pleural effusion similar to that seen on recent CT examination. Electronically Signed   By: Inez Catalina M.D.   On: 07/04/2017 08:38   Dg Chest 2 View  Result Date: 06/30/2017 CLINICAL DATA:  Initial evaluation for acute shortness of breath. History of lung cancer. EXAM: CHEST - 2 VIEW COMPARISON:  Prior radiograph from 06/25/2017. FINDINGS: Right-sided Port-A-Cath in place with tip overlying the proximal right atrium. Cardiac and mediastinal silhouettes stable in size and contour, and remain within normal limits. Lungs normally inflated. Small moderate right  pleural effusion is increased from previous. Scattered vascular congestion with crowding throughout the right lung, progressed from previous. Probable superimposed right basilar atelectasis. Blunting of the left costophrenic angle, which could reflect effusion and/or chronic pleural reaction/scarring, similar to previous. Left lung otherwise largely clear. Left perihilar architectural distortion with possible spiculated nodule, similar to previous. No pneumothorax. No acute osseous abnormality. IMPRESSION: 1. Small to moderate layering right pleural effusion, increased from previous, with worsened vascular congestion/crowding and probable atelectatic changes within the right lung. 2. Blunting of the left costophrenic angle, which could reflect small effusion and/or chronic pleural reaction/scarring, similar to previous. 3. Left perihilar architectural distortion with underlying spiculated nodular density, consistent with known lung malignancy. Electronically Signed   By: Jeannine Boga M.D.   On: 06/30/2017 04:36   Dg Chest 2 View  Result Date: 06/25/2017 CLINICAL DATA:  Shortness of breath.  History of lung carcinoma EXAM: CHEST - 2 VIEW COMPARISON:  June 24, 2017 chest radiograph and chest CT April 13, 2017 FINDINGS: Port-A-Cath tip is in superior vena cava. No pneumothorax. There are small pleural effusions bilaterally with bibasilar scarring. The irregular nodular opacity in the left upper lobe medially is stable, measuring 2.2 x 2.1 cm. No new opacity is evident. Heart size and pulmonary vascularity are normal. No adenopathy is appreciable. No bone lesions. IMPRESSION: Spiculated nodular lesion left upper lobe medially. Neoplastic focus must be of concern given this appearance. Small pleural effusions with mild bibasilar scarring noted. Stable cardiac silhouette. No evident pneumothorax. Electronically Signed   By: Lowella Grip III M.D.   On: 06/25/2017 07:05   Dg Chest 2 View  Result Date:  06/24/2017 CLINICAL DATA:  Shortness of breath. History of COPD and lung cancer. EXAM: CHEST - 2 VIEW COMPARISON:  Chest radiograph June 21, 2017 FINDINGS: Cardiomediastinal silhouette is normal. Similar small right greater left pleural effusions. Mild asymmetrically prominent right interstitium is unchanged. Spiculated left upper lobe nodule. Right single-lumen chest Port-A-Cath distal tip projecting cavoatrial junction. No pneumothorax. Soft tissue planes and included osseous structures are not suspicious. IMPRESSION: 1. Stable small right greater left pleural effusion. Asymmetric right interstitial prominence, possibly secondary to pulmonary edema. 2. Redemonstration of spiculated left upper lobe nodule. Electronically Signed   By: Elon Alas M.D.   On: 06/24/2017 04:35   Dg Chest 2 View  Result Date: 06/21/2017 CLINICAL DATA:  Acute onset of shortness of breath. EXAM: CHEST - 2 VIEW COMPARISON:  Chest radiograph performed 06/05/2017 FINDINGS: A the small right-sided pleural effusion has increased slightly in size. A small left pleural effusion is also seen. Vascular congestion is seen. Increased interstitial markings on the right may reflect asymmetric interstitial edema or possibly pneumonia. The heart is normal in size. No acute  osseous abnormalities are identified. IMPRESSION: 1. Small right-sided pleural effusion has increased slightly in size. Small left pleural effusion also seen. 2. Vascular congestion noted. Increased interstitial markings on the right may reflect asymmetric interstitial edema or possibly pneumonia. Electronically Signed   By: Garald Balding M.D.   On: 06/21/2017 06:10   Ct Angio Chest Pe W And/or Wo Contrast  Addendum Date: 06/30/2017   ADDENDUM REPORT: 06/30/2017 08:21 ADDENDUM: Critical Value/emergent results were called by telephone at the time of interpretation on 06/30/2017 at 8:21 am to [place] in the ED who verbally acknowledged these results. Electronically  Signed   By: Genevie Ann M.D.   On: 06/30/2017 08:21   Result Date: 06/30/2017 CLINICAL DATA:  57 year old male with stage IV lung cancer and severe shortness of breath. EXAM: CT ANGIOGRAPHY CHEST WITH CONTRAST TECHNIQUE: Multidetector CT imaging of the chest was performed using the standard protocol during bolus administration of intravenous contrast. Multiplanar CT image reconstructions and MIPs were obtained to evaluate the vascular anatomy. CONTRAST:  174mL ISOVUE-370 IOPAMIDOL (ISOVUE-370) INJECTION 76% COMPARISON:  Chest CT 04/13/2017, chest CTA 03/13/2017. FINDINGS: Cardiovascular: Good contrast bolus timing in the pulmonary arterial tree. There is no right lung pulmonary artery filling defect. 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 However, this is inseparable from abnormal peribronchial soft tissue tracking to the left hilum from the spiculated left upper lobe tumor. The vessel thrombus is primarily nonocclusive, although distal obliteration of the left upper lobe artery occurs but is stable since 03/13/2017. 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 Additionally, the right chest Port-A-Cath was injected. It is unclear whether there could be thrombus within the SVC and right atrium (series 5, image 184). This could be mixing artifact. Stable cardiac size. No pericardial effusion. Stable visible aorta. Mediastinum/Nodes: Loss of normal mediastinal fat planes, but no discrete mediastinal mass identified. Lungs/Pleura: Continued moderate size layering right pleural effusion, stable since March. Simple fluid density. Trace layering left pleural fluid. Stable major airway patency. Mildly regressed size and  nodularity of the left upper lobe spiculated mass since March and 03/13/2017 (compare series 6, image 37 today to series 6, image 45 in February). However, contiguous opacity from the mass to the left hilum persists. Underlying emphysema, severe on the left.  No new pulmonary opacity. Upper Abdomen: Small volume perihepatic free fluid along the periphery of the visible right hepatic lobe is new, but gastrohepatic ligament fluid as regressed and appears virtually resolved since 04/13/2017. Liver parenchyma, visible spleen, adrenal glands, kidneys, pancreas, and bowel appear within normal limits. Musculoskeletal: Stable visualized osseous structures. Heterogeneous sclerosis of T12 remain stable since February. Review of the MIP images confirms the above findings. IMPRESSION: 1. Positive for evidence of acute pulmonary thrombus at the left main pulmonary artery bifurcation and extending into the left upper lobe. Much of the visible thrombus is nonocclusive, and it might be tumor related, with adjacent peribronchial opacity tracking from the left upper lobe carcinoma to the hilum. 2. 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 No other pulmonary thrombus identified. 3. Mildly regressed size of the spiculated left upper lung mass since March. 4. Continued moderate size layering right pleural effusion. Underlying emphysema. 5. Small  volume perihepatic ascites is new since March but the previously-seen gastrohepatic ligament fluid has regressed. Electronically Signed: By: Genevie Ann M.D. On: 06/30/2017 08:13   Ct Abdomen Pelvis W Contrast  Result Date: 07/16/2017 CLINICAL DATA:  Shortness of breath and dizziness. Diarrhea and nausea. EXAM: CT ABDOMEN AND PELVIS WITH CONTRAST TECHNIQUE: Multidetector CT imaging of the abdomen and pelvis was performed using the standard protocol following bolus administration of intravenous contrast. CONTRAST:  43mL ISOVUE-300 IOPAMIDOL  (ISOVUE-300) INJECTION 61% COMPARISON:  CT abdomen pelvis 07/03/2017 FINDINGS: LOWER CHEST: Small pleural effusions, right greater than left. Bibasilar atelectasis. HEPATOBILIARY: Focal fat deposition along the course of the left portal vein. No focal liver lesion otherwise. No biliary dilatation. Gallbladder is partially contracted. PANCREAS: Normal parenchymal contours without ductal dilatation. No peripancreatic fluid collection. SPLEEN: Normal. ADRENALS/URINARY TRACT: --Adrenal glands: Normal. --Right kidney/ureter: No hydronephrosis, nephroureterolithiasis, perinephric stranding or solid renal mass. --Left kidney/ureter: No hydronephrosis, nephroureterolithiasis, perinephric stranding or solid renal mass. --Urinary bladder: Normal for degree of distention STOMACH/BOWEL: --Stomach/Duodenum: Persistent esophageal wall thickening near the gastroesophageal junction. --Small bowel: No dilatation or inflammation. --Colon: No focal abnormality. --Appendix: Not visualized. No right lower quadrant inflammation or free fluid. VASCULAR/LYMPHATIC: Atherosclerotic calcification is present within the non-aneurysmal abdominal aorta, without hemodynamically significant stenosis. The portal vein, splenic vein, superior mesenteric vein and IVC are patent. No abdominal or pelvic lymphadenopathy. REPRODUCTIVE: Normal prostate and seminal vesicles. MUSCULOSKELETAL. Unchanged lucent lesion of the left L3 vertebral body. T12 vertebral body remains sclerotic. OTHER: None. IMPRESSION: 1. No acute abnormality of the abdomen or pelvis. 2. Small pleural effusions, right larger than left but the right effusion is decreased in size from 07/03/2017. 3.  Aortic Atherosclerosis (ICD10-I70.0). 4. Unchanged sclerotic appearance of T12 and lucent lesion of the inferior endplate of L3. 5. Persistent wall thickening at the gastroesophageal junction. Electronically Signed   By: Ulyses Jarred M.D.   On: 07/16/2017 17:32   US Abdomen  Limited  Result Date: 07/02/2017 CLINICAL DATA:  Evaluate for ascites. EXAM: ULTRASOUND ABDOMEN LIMITED COMPARISON:  None. FINDINGS: Large right pleural effusion.  Small ascites in the abdomen. IMPRESSION: Large right pleural effusion.  Small ascites in the abdomen. Electronically Signed   By: Dorise Bullion III M.D   On: 07/02/2017 18:07   Dg Chest Port 1 View  Result Date: 07/12/2017 CLINICAL DATA:  Pneumothorax on right for follow-up. Thoracentesis 07/11/2017. lung cancer. EXAM: PORTABLE CHEST 1 VIEW COMPARISON:  07/11/2016 FINDINGS: Right IJ Port-A-Cath unchanged. Lungs are adequately inflated and demonstrate emphysematous disease. Small amount left pleural fluid unchanged. Slight worsening of a small amount right pleural fluid. Stable small right basilar pneumothorax. Stable mild prominence of the right perihilar markings. Stable nodule opacity over the left upper lung previously evaluated by CT compatible with known lung cancer. Cardiomediastinal silhouette and remainder the exam is unchanged. IMPRESSION: Stable small right basilar pneumothorax. Small amount right pleural fluid slightly worse. Stable small left pleural effusion. Mild stable prominence of the right perihilar markings. Stable nodular opacity over the left upper lung representing known lung cancer. Electronically Signed   By: Marin Olp M.D.   On: 07/12/2017 07:25    US Thoracentesis Asp Pleural Space W/img Guide  Result Date: 07/11/2017 INDICATION: Patient with history of metastatic lung cancer, dyspnea, recurrent right pleural effusion. Request made for diagnostic and therapeutic right thoracentesis. EXAM: ULTRASOUND GUIDED DIAGNOSTIC AND THERAPEUTIC RIGHT THORACENTESIS MEDICATIONS: None COMPLICATIONS: None immediate. PROCEDURE: An ultrasound guided thoracentesis was thoroughly discussed with the patient and questions answered. The  benefits, risks, alternatives and complications were also discussed. The patient understands and  wishes to proceed with the procedure. Written consent was obtained. Ultrasound was performed to localize and mark an adequate pocket of fluid in the right chest. The area was then prepped and draped in the normal sterile fashion. 1% Lidocaine was used for local anesthesia. Under ultrasound guidance a 6 Fr Safe-T-Centesis catheter was introduced. Thoracentesis was performed. The catheter was removed and a dressing applied. FINDINGS: A total of approximately 680 cc of turbid, bloody fluid was removed. Samples were sent to the laboratory as requested by the clinical team. IMPRESSION: Successful ultrasound guided diagnostic and therapeutic right thoracentesis yielding 680 cc of pleural fluid. Patient was noted to have small right basilar pneumothorax on follow-up chest x-ray, questionable ex vacuo origin. Patient currently stable. We will repeat chest x-ray in a.m. to ensure stability. Read by: Rowe Robert, PA-C Electronically Signed   By: Lucrezia Europe M.D.   On: 07/11/2017 14:44   US Thoracentesis Asp Pleural Space W/img Guide  Result Date: 07/05/2017 INDICATION: Patient with history of metastatic lung cancer, dyspnea, and recurrent right-sided pleural effusion. Request is made for diagnostic and therapeutic right thoracentesis. EXAM: ULTRASOUND GUIDED DIAGNOSTIC AND THERAPEUTIC RIGHT THORACENTESIS MEDICATIONS: 10 mL of 1% lidocaine COMPLICATIONS: None immediate. PROCEDURE: An ultrasound guided thoracentesis was thoroughly discussed with the patient and questions answered. The benefits, risks, alternatives and complications were also discussed. The patient understands and wishes to proceed with the procedure. Written consent was obtained. Ultrasound was performed to localize and mark an adequate pocket of fluid in the right chest. The area was then prepped and draped in the normal sterile fashion. 1% Lidocaine was used for local anesthesia. Under ultrasound guidance a 6 Fr Safe-T-Centesis catheter was introduced.  Thoracentesis was performed. The catheter was removed and a dressing applied. FINDINGS: A total of approximately 1.2 L of blood-tinged fluid was removed. Samples were sent to the laboratory as requested by the clinical team. IMPRESSION: Successful ultrasound guided right thoracentesis yielding 1.2 L of pleural fluid. Read by: Earley Abide, PA-C No pneumothorax on follow-up radiograph. Electronically Signed   By: Lucrezia Europe M.D.   On: 07/05/2017 14:12     I have independently reviewed the above radiology studies  and reviewed the findings with the patient.   Recent Lab Findings: Lab Results  Component Value Date   WBC 18.0 (H) 07/16/2017   HGB 9.2 (L) 07/16/2017   HCT 28.4 (L) 07/16/2017   PLT 218 07/16/2017   GLUCOSE 99 07/16/2017   ALT 14 07/16/2017   AST 23 07/16/2017   NA 144 07/16/2017   K 4.0 07/16/2017   CL 104 07/16/2017   CREATININE 1.54 (H) 07/16/2017   BUN 30 (H) 07/16/2017   CO2 31 07/16/2017   TSH 1.958 12/31/2016   INR 1.01 06/30/2017   HGBA1C 6.2 (H) 12/31/2016      Assessment / Plan:   Patient referred to the office to consider Pleurx catheter on his arrival here he is in respiratory distress wheezing, on oxygen with a tank that is almost empty.  Chest x-ray was done that does not show any significant pleural effusion.  Currently the patient is not a candidate to consider for Pleurx catheter placement as there is no significant fluid noted.  Because of his respiratory distress he is referred to the emergency room for admission, and concern degeneration for palliative care. Adenocarcinoma of left lung, stage 4 (HCC)   HTN (hypertension)   Macrocytic anemia  CKD (chronic kidney disease), stage III (HCC)   Acute on chronic respiratory failure ([place])   History of pulmonary embolism     I  spent 30 minutes with  the patient face to face and greater then 50% of the time was spent in counseling and coordination of care.    Grace Isaac MD      [address] [place] [address] Office [phone]   Beeper [phone]  07/18/2017 3:06 PM

## 2017-07-18 NOTE — Telephone Encounter (Signed)
This encounter was created in error - please disregard.

## 2017-07-19 ENCOUNTER — Encounter (HOSPITAL_COMMUNITY): Payer: Self-pay | Admitting: Emergency Medicine

## 2017-07-19 ENCOUNTER — Other Ambulatory Visit: Payer: Self-pay

## 2017-07-19 ENCOUNTER — Emergency Department (HOSPITAL_COMMUNITY)
Admission: EM | Admit: 2017-07-19 | Discharge: 2017-07-19 | Disposition: A | Discharge status: Skilled Nursing Facility | Payer: Medicaid Other | Attending: Emergency Medicine | Admitting: Emergency Medicine

## 2017-07-19 ENCOUNTER — Emergency Department (HOSPITAL_COMMUNITY): Payer: Medicaid Other

## 2017-07-19 DIAGNOSIS — J441 Chronic obstructive pulmonary disease with (acute) exacerbation: Secondary | ICD-10-CM | POA: Diagnosis not present

## 2017-07-19 DIAGNOSIS — I5042 Chronic combined systolic (congestive) and diastolic (congestive) heart failure: Secondary | ICD-10-CM | POA: Diagnosis not present

## 2017-07-19 DIAGNOSIS — I130 Hypertensive heart and chronic kidney disease with heart failure and stage 1 through stage 4 chronic kidney disease, or unspecified chronic kidney disease: Secondary | ICD-10-CM | POA: Diagnosis not present

## 2017-07-19 DIAGNOSIS — F17210 Nicotine dependence, cigarettes, uncomplicated: Secondary | ICD-10-CM | POA: Diagnosis not present

## 2017-07-19 DIAGNOSIS — Z7901 Long term (current) use of anticoagulants: Secondary | ICD-10-CM | POA: Diagnosis not present

## 2017-07-19 DIAGNOSIS — N183 Chronic kidney disease, stage 3 (moderate): Secondary | ICD-10-CM | POA: Insufficient documentation

## 2017-07-19 DIAGNOSIS — Z79899 Other long term (current) drug therapy: Secondary | ICD-10-CM | POA: Diagnosis not present

## 2017-07-19 DIAGNOSIS — F419 Anxiety disorder, unspecified: Secondary | ICD-10-CM | POA: Insufficient documentation

## 2017-07-19 DIAGNOSIS — R0603 Acute respiratory distress: Secondary | ICD-10-CM | POA: Diagnosis present

## 2017-07-19 LAB — BLOOD GAS, VENOUS
ACID-BASE EXCESS: 7 mmol/L — AB (ref 0.0–2.0)
BICARBONATE: 32.5 mmol/L — AB (ref 20.0–28.0)
O2 SAT: 77.5 %
PATIENT TEMPERATURE: 98.6
pCO2, Ven: 53.8 mmHg (ref 44.0–60.0)
pH, Ven: 7.398 (ref 7.250–7.430)
pO2, Ven: 46.3 mmHg — ABNORMAL HIGH (ref 32.0–45.0)

## 2017-07-19 LAB — CBC WITH DIFFERENTIAL/PLATELET
Basophils Absolute: 0 10*3/uL (ref 0.0–0.1)
Basophils Relative: 0 %
EOS PCT: 0 %
Eosinophils Absolute: 0.1 10*3/uL (ref 0.0–0.7)
HEMATOCRIT: 26.4 % — AB (ref 39.0–52.0)
Hemoglobin: 8.7 g/dL — ABNORMAL LOW (ref 13.0–17.0)
LYMPHS PCT: 1 %
Lymphs Abs: 0.2 10*3/uL — ABNORMAL LOW (ref 0.7–4.0)
MCH: 33.5 pg (ref 26.0–34.0)
MCHC: 33 g/dL (ref 30.0–36.0)
MCV: 101.5 fL — AB (ref 78.0–100.0)
MONO ABS: 0.1 10*3/uL (ref 0.1–1.0)
MONOS PCT: 0 %
Neutro Abs: 17 10*3/uL — ABNORMAL HIGH (ref 1.7–7.7)
Neutrophils Relative %: 99 %
PLATELETS: 225 10*3/uL (ref 150–400)
RBC: 2.6 MIL/uL — ABNORMAL LOW (ref 4.22–5.81)
RDW: 17.8 % — AB (ref 11.5–15.5)
WBC: 17.4 10*3/uL — ABNORMAL HIGH (ref 4.0–10.5)

## 2017-07-19 LAB — BASIC METABOLIC PANEL
Anion gap: 11 (ref 5–15)
BUN: 33 mg/dL — AB (ref 6–20)
CALCIUM: 9 mg/dL (ref 8.9–10.3)
CO2: 32 mmol/L (ref 22–32)
Chloride: 100 mmol/L (ref 98–111)
Creatinine, Ser: 1.38 mg/dL — ABNORMAL HIGH (ref 0.61–1.24)
GFR calc Af Amer: >60 mL/min (ref >60)
GFR calc non Af Amer: 55 mL/min — ABNORMAL LOW (ref >60)
GLUCOSE: 129 mg/dL — AB (ref 70–99)
POTASSIUM: 3.1 mmol/L — AB (ref 3.5–5.1)
Sodium: 143 mmol/L (ref 135–145)

## 2017-07-19 LAB — TROPONIN I: Troponin I: 0.05 ng/mL (ref <0.03)

## 2017-07-19 MED ORDER — SODIUM CHLORIDE 0.9 % IV BOLUS
1000.0000 mL | Freq: Once | INTRAVENOUS | Status: AC
  Administered 2017-07-19: 1000 mL via INTRAVENOUS

## 2017-07-19 MED ORDER — LORAZEPAM 2 MG/ML IJ SOLN
0.5000 mg | Freq: Once | INTRAMUSCULAR | Status: AC
  Administered 2017-07-19: 0.5 mg via INTRAVENOUS
  Filled 2017-07-19: qty 1

## 2017-07-19 MED ORDER — IPRATROPIUM-ALBUTEROL 0.5-2.5 (3) MG/3ML IN SOLN: 3.0000 mL | Freq: Once | RESPIRATORY_TRACT | Status: DC

## 2017-07-19 MED ORDER — POTASSIUM CHLORIDE CRYS ER 20 MEQ PO TBCR
40.0000 meq | EXTENDED_RELEASE_TABLET | Freq: Once | ORAL | Status: AC
Start: 2017-07-19 — End: 2017-07-19
  Administered 2017-07-19: 40 meq via ORAL
  Filled 2017-07-19: qty 2

## 2017-07-19 MED ORDER — IPRATROPIUM-ALBUTEROL 0.5-2.5 (3) MG/3ML IN SOLN
3.0000 mL | Freq: Once | RESPIRATORY_TRACT | Status: AC
  Administered 2017-07-19: 3 mL via RESPIRATORY_TRACT
  Filled 2017-07-19: qty 3

## 2017-07-19 MED ORDER — METHYLPREDNISOLONE SODIUM SUCC 125 MG IJ SOLR
125.0000 mg | Freq: Once | INTRAMUSCULAR | Status: AC
  Administered 2017-07-19: 125 mg via INTRAVENOUS
  Filled 2017-07-19: qty 2

## 2017-07-19 NOTE — ED Triage Notes (Signed)
Per [facility], pt from [facility] with SOB x 2days. EMS treated him onsite yesterday and it became worse today. Expiratory wheezing on right side with diminished sounds in lower lobes bilaterally. Hx of stage 4 lung cancer on right side. Currently undergoing treatment. Received 10mg  albuterol, 0.5mg  of atrovent, 125 solumedrol, and 4mg  zofran en route. Tachypnea and tachycardia present with HR120 and RR30.

## 2017-07-19 NOTE — ED Notes (Signed)
Pt politely asked if he could stay in his own clothes in stead of changing into a gown.

## 2017-07-19 NOTE — Discharge Instructions (Addendum)
Make sure to keep your oxygen on.  Follow up with Dr. Earlie Server as scheduled.

## 2017-07-20 ENCOUNTER — Other Ambulatory Visit: Payer: Self-pay

## 2017-07-20 ENCOUNTER — Emergency Department (HOSPITAL_COMMUNITY): Payer: Medicaid Other

## 2017-07-20 ENCOUNTER — Emergency Department (HOSPITAL_COMMUNITY)
Admission: EM | Admit: 2017-07-20 | Discharge: 2017-07-21 | Disposition: A | Discharge status: Skilled Nursing Facility | Payer: Medicaid Other | Attending: Emergency Medicine | Admitting: Emergency Medicine

## 2017-07-20 ENCOUNTER — Encounter (HOSPITAL_COMMUNITY): Payer: Self-pay

## 2017-07-20 DIAGNOSIS — I130 Hypertensive heart and chronic kidney disease with heart failure and stage 1 through stage 4 chronic kidney disease, or unspecified chronic kidney disease: Secondary | ICD-10-CM | POA: Insufficient documentation

## 2017-07-20 DIAGNOSIS — N183 Chronic kidney disease, stage 3 (moderate): Secondary | ICD-10-CM | POA: Insufficient documentation

## 2017-07-20 DIAGNOSIS — C3490 Malignant neoplasm of unspecified part of unspecified bronchus or lung: Secondary | ICD-10-CM | POA: Insufficient documentation

## 2017-07-20 DIAGNOSIS — J80 Acute respiratory distress syndrome: Secondary | ICD-10-CM | POA: Insufficient documentation

## 2017-07-20 DIAGNOSIS — I5042 Chronic combined systolic (congestive) and diastolic (congestive) heart failure: Secondary | ICD-10-CM | POA: Diagnosis not present

## 2017-07-20 DIAGNOSIS — R0602 Shortness of breath: Secondary | ICD-10-CM | POA: Diagnosis present

## 2017-07-20 DIAGNOSIS — F17210 Nicotine dependence, cigarettes, uncomplicated: Secondary | ICD-10-CM | POA: Insufficient documentation

## 2017-07-20 DIAGNOSIS — Z79899 Other long term (current) drug therapy: Secondary | ICD-10-CM | POA: Diagnosis not present

## 2017-07-20 DIAGNOSIS — R0603 Acute respiratory distress: Secondary | ICD-10-CM

## 2017-07-20 NOTE — ED Triage Notes (Signed)
Pt arrived from [facility] today stated that he has been feeling bad for the past 2 days. Pt was seen at this facility yesterday for same complaint. Pt reports that he feels short of breath and is also having abdominal pain. Pt has stage 4 lung cancer and is getting treatment. Pt received x2 albuterol treatment and 125mg  of Solumedrol by EMS

## 2017-07-20 NOTE — ED Notes (Signed)
Bed: WA02 Expected date:  Expected time:  Means of arrival:  Comments: EMS

## 2017-07-20 NOTE — ED Provider Notes (Signed)
[facility] DEPT Provider Note   CSN: [id number] Arrival date & time: 07/19/17  1401     History   Chief Complaint Chief Complaint  Patient presents with  . Respiratory Distress    HPI ATARI NOVICK is a 57 y.o. male.  The history is provided by the patient. No language interpreter was used.  Shortness of Breath  This is a recurrent problem. The average episode lasts 1 day. The problem occurs continuously.The current episode started 12 to 24 hours ago. The problem has been rapidly worsening. Associated symptoms include cough. Pertinent negatives include no fever and no headaches. He has tried nothing for the symptoms. The treatment provided no relief. He has had no prior hospitalizations. He has had no prior ICU admissions. Associated medical issues include COPD.  Pt reports his 02 tank is out of oxygen at the Nursing home where he lives. Pt reports he is being treated for lung cancer.    Past Medical History:  Diagnosis Date  . Abdominal pain 06/04/2016  . Adenocarcinoma of left lung, stage 4 ([place]) 05/02/2016  . Alcohol abuse   . Bronchitis due to tobacco use ([place])   . Cancer ([place])    Lung  . COPD (chronic obstructive pulmonary disease) ([place])   . Dehydration 06/04/2016  . Encounter for antineoplastic chemotherapy 05/02/2016  . Gastritis   . Goals of care, counseling/discussion 05/02/2016  . Hematemesis   . HTN (hypertension) 10/30/2016  . Recurrent upper respiratory infection (URI)   . Seizures ([place]) 05/2011   new onset  . Shortness of breath     Patient Active Problem List   Diagnosis Date Noted  . Recurrent pleural effusion on right 07/15/2017  . Acute on chronic respiratory failure ([place]) 07/11/2017  . History of pulmonary embolism 07/11/2017  . PE (pulmonary thromboembolism) ([place]) 06/30/2017  . Pulmonary embolism ([place]) 06/30/2017  . AKI (acute kidney injury) ([place]) 06/25/2017  . Dyspnea 06/21/2017  . Chronic combined systolic  (congestive) and diastolic (congestive) heart failure ([place])   . Constipation 06/07/2017  . Pain   . Acute on chronic respiratory failure with hypoxia ([place]) 05/28/2017  . Chronic respiratory failure with hypoxia ([place]) 04/01/2017  . Leucocytosis 03/16/2017  . CKD (chronic kidney disease), stage III ([place]) 03/12/2017  . Malnutrition of moderate degree 03/10/2017  . COPD exacerbation ([place]) 03/09/2017  . Macrocytic anemia 02/07/2017  . Severe malnutrition ([place]) 01/03/2017  . DOE (dyspnea on exertion) 12/30/2016  . Nausea   . Hypervolemia   . HCAP (healthcare-associated pneumonia) 12/19/2016  . SOB (shortness of breath) 12/19/2016  . Erosive gastropathy 12/18/2016  . Gastritis 12/18/2016  . Hematemesis 12/16/2016  . Intractable nausea and vomiting 12/16/2016  . HTN (hypertension) 10/30/2016  . Chronic obstructive pulmonary disease ([place]) 09/02/2016  . COPD (chronic obstructive pulmonary disease) ([place]) 08/19/2016  . Dehydration 06/04/2016  . Abdominal pain 06/04/2016  . 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 Adenocarcinoma of left lung, stage 4 ([place]) 05/02/2016  . Goals of care, counseling/discussion 05/02/2016  . Encounter for antineoplastic chemotherapy 05/02/2016  . Tobacco abuse 05/30/2011  . Alcohol abuse 05/30/2011  . Seizure ([place]) 05/28/2011    Past Surgical History:  Procedure Laterality Date  . ESOPHAGOGASTRODUODENOSCOPY (EGD) WITH PROPOFOL N/A 12/17/2016   Procedure: ESOPHAGOGASTRODUODENOSCOPY (EGD) WITH PROPOFOL;  Surgeon: Ladene Artist, MD;  Location: [facility] ENDOSCOPY;  Service: Endoscopy;  Laterality: N/A;  . IR FLUORO GUIDE PORT INSERTION RIGHT  05/13/2016  . IR US GUIDE VASC ACCESS RIGHT  05/13/2016  .  NO PAST SURGERIES          Home Medications    Prior to Admission medications   Medication Sig Start Date End Date Taking? Authorizing Provider  acetaminophen (TYLENOL) 500 MG tablet Take 1 tablet (500 mg total) by mouth 2 (two) times daily as needed for mild pain.  07/07/17   Florencia Reasons, MD  albuterol (PROVENTIL) (2.5 MG/3ML) 0.083% nebulizer solution 1 neb every 4-6 hours as needed for wheezing and shortness of breath 04/01/17   Parrett, Fonnie Mu, NP  apixaban (ELIQUIS) 5 MG TABS tablet Take 1 tablet (5 mg total) by mouth 2 (two) times daily. Patient taking differently: Take 10 mg by mouth 2 (two) times daily.  07/08/17   Florencia Reasons, MD  Chlorphen-Phenyleph-ASA (ALKA-SELTZER PLUS COLD PO) Take 2 tablets by mouth at bedtime as needed (COUGH).    [provider]  dexamethasone (DECADRON) 4 MG tablet Take 4 mg by mouth 2 (two) times daily. The day before, the day of, and the day after chemotherapy every three weeks    [provider]  Dextromethorphan-guaiFENesin (TUSSIN DM) 10-100 MG/5ML liquid Take 5 mLs by mouth every 12 (twelve) hours.    [provider]  escitalopram (LEXAPRO) 5 MG tablet Take 1 tablet (5 mg total) by mouth at bedtime. 04/17/17   Rosita Fire, MD  feeding supplement, ENSURE ENLIVE, (ENSURE ENLIVE) LIQD Take 237 mLs by mouth 3 (three) times daily between meals. 03/19/17   Shelly Coss, MD  ferrous sulfate 325 (65 FE) MG tablet Take 1 tablet (325 mg total) by mouth 2 (two) times daily with a meal. 07/12/17   Lavina Hamman, MD  Fluticasone-Umeclidin-Vilant (TRELEGY ELLIPTA) 100-62.5-25 MCG/INH AEPB Inhale 1 puff into the lungs daily. 05/31/17   Aline August, MD  folic acid (FOLVITE) 1 MG tablet Take 1 tablet (1 mg total) by mouth daily. 07/13/17   Lavina Hamman, MD  furosemide (LASIX) 40 MG tablet Take 1 tablet (40 mg total) by mouth daily. 07/13/17   Lavina Hamman, MD  gabapentin (NEURONTIN) 100 MG capsule Take 1 capsule (100 mg total) by mouth 3 (three) times daily. 07/12/17   Lavina Hamman, MD  guaiFENesin (MUCINEX) 600 MG 12 hr tablet Take 1,200 mg by mouth 2 (two) times daily.     [provider]  Ipratropium-Albuterol (COMBIVENT) 20-100 MCG/ACT AERS respimat Inhale 1 puff into the lungs every 6  (six) hours. 05/31/17   Aline August, MD  loratadine (CLARITIN) 10 MG tablet Take 10 mg by mouth daily.    [provider]  metoprolol tartrate (LOPRESSOR) 25 MG tablet Take 12.5 mg by mouth 2 (two) times daily.     [provider]  ondansetron (ZOFRAN-ODT) 4 MG disintegrating tablet Take 4 mg by mouth every 8 (eight) hours as needed for nausea or vomiting.    [provider]  Oxycodone HCl 10 MG TABS Take 1 tablet (10 mg total) by mouth every 4 (four) hours as needed for up to 15 doses (pain/shortness of breath). 07/13/17   McDonald, Mia A, PA-C  pantoprazole (PROTONIX) 40 MG tablet Take 1 tablet (40 mg total) by mouth 2 (two) times daily. 12/18/16   Eugenie Filler, MD  predniSONE (DELTASONE) 10 MG tablet Take 10 mg by mouth daily with breakfast.    [provider]  senna-docusate (SENOKOT-S) 8.6-50 MG tablet Take 2 tablets by mouth 2 (two) times daily. Patient taking differently: Take 1 tablet by mouth 2 (two) times daily.  07/12/17   Lavina Hamman, MD  sucralfate (CARAFATE) 1 GM/10ML suspension Take 10 mLs (1 g total) by mouth 4 (four) times daily -  with meals and at bedtime. 07/12/17   Lavina Hamman, MD    Family History Family History  Problem Relation Age of Onset  . Cancer Father   . Diabetes Mellitus II Mother     Social History Social History   Tobacco Use  . Smoking status: Current Some Day Smoker    Packs/day: 0.10    Years: 30.00    Pack years: 3.00    Types: Cigarettes  . Smokeless tobacco: Never Used  . Tobacco comment: 1 cig a week  Substance Use Topics  . Alcohol use: No    Frequency: Never  . Drug use: No     Allergies   Patient has no known allergies.   Review of Systems Review of Systems  Constitutional: Negative for fever.  Respiratory: Positive for cough and shortness of breath.   Neurological: Negative for headaches.  All other systems reviewed and are negative.    Physical Exam Updated Vital Signs BP  138/86 (BP Location: Right Arm)   Pulse 98   Temp 97.8 F (36.6 C) (Oral)   Resp (!) 23   Ht 5\' 11"  (1.803 m)   Wt 53.5 kg (118 lb)   SpO2 100%   BMI 16.46 kg/m   Physical Exam  Constitutional: He appears well-developed and well-nourished.  HENT:  Head: Normocephalic.  Right Ear: External ear normal.  Left Ear: External ear normal.  Nose: Nose normal.  Mouth/Throat: Oropharynx is clear and moist.  Eyes: Pupils are equal, round, and reactive to light. Conjunctivae and EOM are normal.  Neck: Normal range of motion.  Cardiovascular: Normal rate.  Pulmonary/Chest: Effort normal and breath sounds normal.  Abdominal: Soft.  Musculoskeletal: Normal range of motion.  Skin: Skin is warm.  Psychiatric: He has a normal mood and affect.  Nursing note and vitals reviewed.    ED Treatments / Results  Labs (all labs ordered are listed, but only abnormal results are displayed) Labs Reviewed  BASIC METABOLIC PANEL - Abnormal; Notable for the following components:      Result Value   Potassium 3.1 (*)    Glucose, Bld 129 (*)    BUN 33 (*)    Creatinine, Ser 1.38 (*)    GFR calc non Af Amer 55 (*)    All other components within normal limits  CBC WITH DIFFERENTIAL/PLATELET - Abnormal; Notable for the following components:   WBC 17.4 (*)    RBC 2.60 (*)    Hemoglobin 8.7 (*)    HCT 26.4 (*)    MCV 101.5 (*)    RDW 17.8 (*)    Neutro Abs 17.0 (*)    Lymphs Abs 0.2 (*)    All other components within normal limits  TROPONIN I - Abnormal; Notable for the following components:   Troponin I 0.05 (*)    All other components within normal limits  BLOOD GAS, VENOUS - Abnormal; Notable for the following components:   pO2, Ven 46.3 (*)    Bicarbonate 32.5 (*)    Acid-Base Excess 7.0 (*)    All other components within normal limits    EKG EKG Interpretation  Date/Time:  Saturday July 19 2017 14:19:27 EDT Ventricular Rate:  127 PR Interval:    QRS Duration: 77 QT  Interval:  352 QTC Calculation: 512 R Axis:   126 Text Interpretation:  Sinus tachycardia Consider right atrial enlargement Right axis deviation Borderline repolarization abnormality Prolonged QT interval Baseline wander in lead(s) II III aVF Since last tracing rate faster and QT is longer Confirmed by Daleen Bo [phone]) on 07/19/2017 2:55:15 PM   Radiology Dg Chest 2 View  Result Date: 07/18/2017 CLINICAL DATA:  Lung cancer. EXAM: CHEST - 2 VIEW COMPARISON:  Chest x-ray 07/16/2017, 05/28/2017, 03/17/2017. CT 03/13/2017. FINDINGS: PowerPort catheter noted with tip over right atrium. Cardiomegaly with normal pulmonary vascularity. Hyperexpansion of both lung fields noted consistent with COPD. Mild mid lung field basilar subsegmental atelectasis. Unchanged bilateral basilar pleural thickening consistent with persistent effusions and or scarring. IMPRESSION: 1.  PowerPort catheter noted in stable position. 2. COPD. Mild subsegmental atelectasis noted the mid lung fields and both bases. Mild bilateral basilar pleural thickening consistent with persistent effusions and/or scarring. No acute abnormality identified. 3.  Stable cardiomegaly.  No pulmonary venous congestion. Electronically Signed   By: Marcello Moores  Register   On: 07/18/2017 14:02   Dg Chest Port 1 View  Result Date: 07/19/2017 CLINICAL DATA:  Shortness of breath for 2 days EXAM: PORTABLE CHEST 1 VIEW COMPARISON:  07/18/2017 FINDINGS: Right-sided chest port is again noted in satisfactory position. Lungs are hyperinflated bilaterally consistent with COPD. Chronic blunting of the costophrenic angles is again seen. Slight increased interstitial markings are noted in the right lung similar to that seen on the prior exam. No pneumothorax is seen. No acute bony abnormality is noted. IMPRESSION: COPD and chronic changes similar to that seen on the previous day. Electronically Signed   By: Inez Catalina M.D.   On: 07/19/2017 15:17     Procedures Procedures (including critical care time)  Medications Ordered in ED Medications  ipratropium-albuterol (DUONEB) 0.5-2.5 (3) MG/3ML nebulizer solution 3 mL (3 mLs Nebulization Given 07/19/17 1656)  potassium chloride SA (K-DUR,KLOR-CON) CR tablet 40 mEq (40 mEq Oral Given 07/19/17 1918)  methylPREDNISolone sodium succinate (SOLU-MEDROL) 125 mg/2 mL injection 125 mg (125 mg Intravenous Given 07/19/17 1919)  sodium chloride 0.9 % bolus 1,000 mL (0 mLs Intravenous Stopped 07/19/17 2020)  LORazepam (ATIVAN) injection 0.5 mg (0.5 mg Intravenous Given 07/19/17 1919)     Initial Impression / Assessment and Plan / ED Course  I have reviewed the triage vital signs and the nursing notes.  Pertinent labs & imaging results that were available during my care of the patient were reviewed by me and considered in my medical decision making (see chart for details).  Clinical Course as of Jul 20 20  Sat Jul 19, 2017  6160 Basic metabolic panel(!) [LS]  7371 Basic metabolic panel(!) [LS]    Clinical Course User Index [LS] Fransico Meadow, PA-C    Pt given solumedrol and duoneb.  Pt's 02 stas 100%.   Henrene Hawking spoke with Rn at facility.  She reports pt has 02 that he removes and becomes short of breath.  Rn reports pt is frequently anxious and calls EMS.  Pt given ativan here.  Pt seems stable for discharge.   Final Clinical Impressions(s) / ED Diagnoses   Final diagnoses:  COPD exacerbation ([place])  Anxiety    ED Discharge Orders    None    An After Visit Summary was printed and given to the patient.    Fransico Meadow, PA-C 07/20/17 Sharl Ma    Daleen Bo, MD 07/20/17 [phone]

## 2017-07-21 MED ORDER — MORPHINE SULFATE (PF) 4 MG/ML IV SOLN
6.0000 mg | Freq: Once | INTRAVENOUS | Status: DC
  Filled 2017-07-21: qty 2

## 2017-07-21 MED ORDER — OXYCODONE-ACETAMINOPHEN 5-325 MG PO TABS
2.0000 | ORAL_TABLET | Freq: Once | ORAL | Status: AC
  Administered 2017-07-21: 2 via ORAL
  Filled 2017-07-21: qty 2

## 2017-07-21 NOTE — Discharge Instructions (Addendum)
We have messaged the Palliative team to see if they can help you further in getting your symptoms in better control.

## 2017-07-21 NOTE — ED Notes (Signed)
Pt transported by [facility] back to facility. Vitals stable on departure

## 2017-07-21 NOTE — ED Notes (Signed)
Called [facility] to set up transport back to facility

## 2017-07-21 NOTE — ED Notes (Signed)
[place] and spoke to [place] about Pt's return back to facility.

## 2017-07-21 NOTE — ED Notes (Signed)
ED Provider at bedside. 

## 2017-07-23 ENCOUNTER — Emergency Department (HOSPITAL_COMMUNITY): Payer: Medicaid Other

## 2017-07-23 ENCOUNTER — Other Ambulatory Visit: Payer: Self-pay

## 2017-07-23 ENCOUNTER — Observation Stay (HOSPITAL_COMMUNITY)
Admission: EM | Admit: 2017-07-23 | Discharge: 2017-07-25 | Disposition: A | Discharge status: Nursing Home (Includes ICF) | Payer: Medicaid Other | Attending: Internal Medicine | Admitting: Internal Medicine

## 2017-07-23 DIAGNOSIS — J90 Pleural effusion, not elsewhere classified: Secondary | ICD-10-CM | POA: Insufficient documentation

## 2017-07-23 DIAGNOSIS — R188 Other ascites: Secondary | ICD-10-CM | POA: Diagnosis not present

## 2017-07-23 DIAGNOSIS — N183 Chronic kidney disease, stage 3 unspecified: Secondary | ICD-10-CM | POA: Diagnosis present

## 2017-07-23 DIAGNOSIS — K219 Gastro-esophageal reflux disease without esophagitis: Secondary | ICD-10-CM | POA: Diagnosis not present

## 2017-07-23 DIAGNOSIS — I700 Atherosclerosis of aorta: Secondary | ICD-10-CM | POA: Insufficient documentation

## 2017-07-23 DIAGNOSIS — I5033 Acute on chronic diastolic (congestive) heart failure: Secondary | ICD-10-CM | POA: Diagnosis present

## 2017-07-23 DIAGNOSIS — R109 Unspecified abdominal pain: Secondary | ICD-10-CM | POA: Diagnosis present

## 2017-07-23 DIAGNOSIS — F329 Major depressive disorder, single episode, unspecified: Secondary | ICD-10-CM | POA: Diagnosis not present

## 2017-07-23 DIAGNOSIS — Z66 Do not resuscitate: Secondary | ICD-10-CM | POA: Insufficient documentation

## 2017-07-23 DIAGNOSIS — Z9981 Dependence on supplemental oxygen: Secondary | ICD-10-CM | POA: Diagnosis not present

## 2017-07-23 DIAGNOSIS — J9621 Acute and chronic respiratory failure with hypoxia: Secondary | ICD-10-CM | POA: Diagnosis not present

## 2017-07-23 DIAGNOSIS — I5032 Chronic diastolic (congestive) heart failure: Secondary | ICD-10-CM | POA: Diagnosis not present

## 2017-07-23 DIAGNOSIS — R531 Weakness: Secondary | ICD-10-CM | POA: Insufficient documentation

## 2017-07-23 DIAGNOSIS — D649 Anemia, unspecified: Secondary | ICD-10-CM | POA: Diagnosis present

## 2017-07-23 DIAGNOSIS — Z7901 Long term (current) use of anticoagulants: Secondary | ICD-10-CM | POA: Insufficient documentation

## 2017-07-23 DIAGNOSIS — N400 Enlarged prostate without lower urinary tract symptoms: Secondary | ICD-10-CM | POA: Insufficient documentation

## 2017-07-23 DIAGNOSIS — J441 Chronic obstructive pulmonary disease with (acute) exacerbation: Secondary | ICD-10-CM | POA: Diagnosis not present

## 2017-07-23 DIAGNOSIS — I2699 Other pulmonary embolism without acute cor pulmonale: Secondary | ICD-10-CM | POA: Diagnosis not present

## 2017-07-23 DIAGNOSIS — I130 Hypertensive heart and chronic kidney disease with heart failure and stage 1 through stage 4 chronic kidney disease, or unspecified chronic kidney disease: Secondary | ICD-10-CM | POA: Insufficient documentation

## 2017-07-23 DIAGNOSIS — J939 Pneumothorax, unspecified: Secondary | ICD-10-CM | POA: Insufficient documentation

## 2017-07-23 DIAGNOSIS — C3492 Malignant neoplasm of unspecified part of left bronchus or lung: Secondary | ICD-10-CM | POA: Diagnosis present

## 2017-07-23 DIAGNOSIS — Z79899 Other long term (current) drug therapy: Secondary | ICD-10-CM | POA: Diagnosis not present

## 2017-07-23 DIAGNOSIS — I10 Essential (primary) hypertension: Secondary | ICD-10-CM | POA: Diagnosis present

## 2017-07-23 DIAGNOSIS — Z720 Tobacco use: Secondary | ICD-10-CM | POA: Diagnosis present

## 2017-07-23 LAB — CBC WITH DIFFERENTIAL/PLATELET
BASOS ABS: 0 10*3/uL (ref 0.0–0.1)
BASOS PCT: 0 %
EOS ABS: 0 10*3/uL (ref 0.0–0.7)
EOS PCT: 0 %
HCT: 21.3 % — ABNORMAL LOW (ref 39.0–52.0)
Hemoglobin: 7 g/dL — ABNORMAL LOW (ref 13.0–17.0)
LYMPHS ABS: 0.2 10*3/uL — AB (ref 0.7–4.0)
Lymphocytes Relative: 6 %
MCH: 32.4 pg (ref 26.0–34.0)
MCHC: 32.9 g/dL (ref 30.0–36.0)
MCV: 98.6 fL (ref 78.0–100.0)
Monocytes Absolute: 0.2 10*3/uL (ref 0.1–1.0)
Monocytes Relative: 3 %
Neutro Abs: 4 10*3/uL (ref 1.7–7.7)
Neutrophils Relative %: 91 %
PLATELETS: 123 10*3/uL — AB (ref 150–400)
RBC: 2.16 MIL/uL — AB (ref 4.22–5.81)
RDW: 17 % — ABNORMAL HIGH (ref 11.5–15.5)
WBC: 4.4 10*3/uL (ref 4.0–10.5)

## 2017-07-23 MED ORDER — IPRATROPIUM BROMIDE 0.02 % IN SOLN
0.5000 mg | Freq: Once | RESPIRATORY_TRACT | Status: AC
  Administered 2017-07-23: 0.5 mg via RESPIRATORY_TRACT
  Filled 2017-07-23: qty 2.5

## 2017-07-23 MED ORDER — METHYLPREDNISOLONE SODIUM SUCC 125 MG IJ SOLR
125.0000 mg | Freq: Once | INTRAMUSCULAR | Status: AC
  Administered 2017-07-24: 125 mg via INTRAVENOUS
  Filled 2017-07-23: qty 2

## 2017-07-23 MED ORDER — ALBUTEROL SULFATE (2.5 MG/3ML) 0.083% IN NEBU
5.0000 mg | INHALATION_SOLUTION | Freq: Once | RESPIRATORY_TRACT | Status: AC
  Administered 2017-07-23: 5 mg via RESPIRATORY_TRACT
  Filled 2017-07-23: qty 6

## 2017-07-23 NOTE — ED Notes (Signed)
Bed: SV77 Expected date:  Expected time:  Means of arrival:  Comments: ems

## 2017-07-23 NOTE — ED Triage Notes (Signed)
Patient BIB EMS from [name] [place] for SOB that started at 5pm today. Pt on 2L of O2 at baseline and has luncg cancer. Pt. Received 5mg  albuterol, duoneb, and solumedrol en route by EMS.  Vitals BP 150/103 HR 108 100% on 8L O2 RR 20

## 2017-07-24 ENCOUNTER — Other Ambulatory Visit: Payer: Self-pay

## 2017-07-24 ENCOUNTER — Encounter (HOSPITAL_COMMUNITY): Payer: Self-pay | Admitting: Emergency Medicine

## 2017-07-24 DIAGNOSIS — N183 Chronic kidney disease, stage 3 (moderate): Secondary | ICD-10-CM

## 2017-07-24 DIAGNOSIS — J9621 Acute and chronic respiratory failure with hypoxia: Secondary | ICD-10-CM

## 2017-07-24 DIAGNOSIS — I5032 Chronic diastolic (congestive) heart failure: Secondary | ICD-10-CM | POA: Diagnosis not present

## 2017-07-24 DIAGNOSIS — C3492 Malignant neoplasm of unspecified part of left bronchus or lung: Secondary | ICD-10-CM | POA: Diagnosis not present

## 2017-07-24 DIAGNOSIS — I2699 Other pulmonary embolism without acute cor pulmonale: Secondary | ICD-10-CM

## 2017-07-24 DIAGNOSIS — Z720 Tobacco use: Secondary | ICD-10-CM | POA: Diagnosis not present

## 2017-07-24 DIAGNOSIS — D649 Anemia, unspecified: Secondary | ICD-10-CM | POA: Diagnosis present

## 2017-07-24 DIAGNOSIS — I5042 Chronic combined systolic (congestive) and diastolic (congestive) heart failure: Secondary | ICD-10-CM | POA: Diagnosis not present

## 2017-07-24 DIAGNOSIS — I5033 Acute on chronic diastolic (congestive) heart failure: Secondary | ICD-10-CM | POA: Diagnosis present

## 2017-07-24 LAB — CBC
HCT: 23.7 % — ABNORMAL LOW (ref 39.0–52.0)
HEMATOCRIT: 11.2 % — AB (ref 39.0–52.0)
Hemoglobin: 3.7 g/dL — CL (ref 13.0–17.0)
Hemoglobin: 7.7 g/dL — ABNORMAL LOW (ref 13.0–17.0)
MCH: 32.5 pg (ref 26.0–34.0)
MCH: 31.2 pg (ref 26.0–34.0)
MCHC: 33 g/dL (ref 30.0–36.0)
MCHC: 32.5 g/dL (ref 30.0–36.0)
MCV: 98.2 fL (ref 78.0–100.0)
MCV: 96 fL (ref 78.0–100.0)
Platelets: 125 10*3/uL — ABNORMAL LOW (ref 150–400)
Platelets: 99 10*3/uL — ABNORMAL LOW (ref 150–400)
RBC: 1.14 MIL/uL — ABNORMAL LOW (ref 4.22–5.81)
RBC: 2.47 MIL/uL — ABNORMAL LOW (ref 4.22–5.81)
RDW: 17.3 % — AB (ref 11.5–15.5)
RDW: 16.9 % — ABNORMAL HIGH (ref 11.5–15.5)
WBC: 5.9 10*3/uL (ref 4.0–10.5)
WBC: 4 10*3/uL (ref 4.0–10.5)

## 2017-07-24 LAB — RESPIRATORY PANEL BY PCR
ADENOVIRUS-RVPPCR: NOT DETECTED
Bordetella pertussis: NOT DETECTED
CORONAVIRUS 229E-RVPPCR: NOT DETECTED
CORONAVIRUS NL63-RVPPCR: NOT DETECTED
CORONAVIRUS OC43-RVPPCR: NOT DETECTED
Chlamydophila pneumoniae: NOT DETECTED
Coronavirus HKU1: NOT DETECTED
Influenza A: NOT DETECTED
Influenza B: NOT DETECTED
METAPNEUMOVIRUS-RVPPCR: NOT DETECTED
MYCOPLASMA PNEUMONIAE-RVPPCR: NOT DETECTED
PARAINFLUENZA VIRUS 1-RVPPCR: NOT DETECTED
PARAINFLUENZA VIRUS 2-RVPPCR: NOT DETECTED
Parainfluenza Virus 3: NOT DETECTED
Parainfluenza Virus 4: NOT DETECTED
Respiratory Syncytial Virus: NOT DETECTED
Rhinovirus / Enterovirus: NOT DETECTED

## 2017-07-24 LAB — BASIC METABOLIC PANEL
Anion gap: 13 (ref 5–15)
BUN: 25 mg/dL — AB (ref 6–20)
CO2: 26 mmol/L (ref 22–32)
Calcium: 8.9 mg/dL (ref 8.9–10.3)
Chloride: 99 mmol/L (ref 98–111)
Creatinine, Ser: 1.62 mg/dL — ABNORMAL HIGH (ref 0.61–1.24)
GFR calc Af Amer: 53 mL/min — ABNORMAL LOW (ref >60)
GFR, EST NON AFRICAN AMERICAN: 46 mL/min — AB (ref >60)
GLUCOSE: 265 mg/dL — AB (ref 70–99)
POTASSIUM: 3.5 mmol/L (ref 3.5–5.1)
Sodium: 138 mmol/L (ref 135–145)

## 2017-07-24 LAB — LIPASE, BLOOD: Lipase: 22 U/L (ref 11–51)

## 2017-07-24 LAB — MRSA PCR SCREENING: MRSA BY PCR: NEGATIVE

## 2017-07-24 LAB — PREPARE RBC (CROSSMATCH)

## 2017-07-24 LAB — PROTIME-INR
INR: 1.06
Prothrombin Time: 13.8 seconds (ref 11.4–15.2)

## 2017-07-24 LAB — MAGNESIUM: MAGNESIUM: 1.9 mg/dL (ref 1.7–2.4)

## 2017-07-24 LAB — BRAIN NATRIURETIC PEPTIDE: B Natriuretic Peptide: 49.6 pg/mL (ref 0.0–100.0)

## 2017-07-24 LAB — APTT: APTT: 43 s — AB (ref 24–36)

## 2017-07-24 LAB — STREP PNEUMONIAE URINARY ANTIGEN: STREP PNEUMO URINARY ANTIGEN: NEGATIVE

## 2017-07-24 MED ORDER — FUROSEMIDE 10 MG/ML IJ SOLN
20.0000 mg | Freq: Once | INTRAMUSCULAR | Status: AC
  Administered 2017-07-24: 20 mg via INTRAVENOUS
  Filled 2017-07-24: qty 2

## 2017-07-24 MED ORDER — LEVALBUTEROL HCL 1.25 MG/0.5ML IN NEBU: 1.2500 mg | INHALATION_SOLUTION | Freq: Four times a day (QID) | RESPIRATORY_TRACT | Status: DC

## 2017-07-24 MED ORDER — FUROSEMIDE 40 MG PO TABS
40.0000 mg | ORAL_TABLET | Freq: Every day | ORAL | Status: DC
  Administered 2017-07-24 – 2017-07-25 (×2): 40 mg via ORAL
  Filled 2017-07-24 (×2): qty 1

## 2017-07-24 MED ORDER — AZITHROMYCIN 250 MG PO TABS
250.0000 mg | ORAL_TABLET | Freq: Every day | ORAL | Status: DC
  Administered 2017-07-24: 250 mg via ORAL
  Filled 2017-07-24: qty 1

## 2017-07-24 MED ORDER — UMECLIDINIUM-VILANTEROL 62.5-25 MCG/INH IN AEPB
1.0000 | INHALATION_SPRAY | Freq: Every day | RESPIRATORY_TRACT | Status: DC
  Administered 2017-07-24 – 2017-07-25 (×2): 1 via RESPIRATORY_TRACT
  Filled 2017-07-24: qty 14

## 2017-07-24 MED ORDER — METHYLPREDNISOLONE SODIUM SUCC 125 MG IJ SOLR
60.0000 mg | Freq: Two times a day (BID) | INTRAMUSCULAR | Status: DC
  Administered 2017-07-24 – 2017-07-25 (×3): 60 mg via INTRAVENOUS
  Filled 2017-07-24 (×3): qty 2

## 2017-07-24 MED ORDER — POTASSIUM CHLORIDE 20 MEQ/15ML (10%) PO SOLN: 40.0000 meq | Freq: Once | ORAL | Status: DC

## 2017-07-24 MED ORDER — ALBUTEROL SULFATE (2.5 MG/3ML) 0.083% IN NEBU
5.0000 mg | INHALATION_SOLUTION | Freq: Once | RESPIRATORY_TRACT | Status: AC
  Administered 2017-07-24: 5 mg via RESPIRATORY_TRACT
  Filled 2017-07-24: qty 6

## 2017-07-24 MED ORDER — ALBUTEROL SULFATE (2.5 MG/3ML) 0.083% IN NEBU: 2.5000 mg | INHALATION_SOLUTION | RESPIRATORY_TRACT | Status: DC | PRN

## 2017-07-24 MED ORDER — SODIUM CHLORIDE 0.9% IV SOLUTION
Freq: Once | INTRAVENOUS | Status: AC
  Administered 2017-07-24: 07:00:00 via INTRAVENOUS

## 2017-07-24 MED ORDER — AZITHROMYCIN 250 MG PO TABS
500.0000 mg | ORAL_TABLET | Freq: Every day | ORAL | Status: AC
  Administered 2017-07-24: 500 mg via ORAL
  Filled 2017-07-24: qty 2

## 2017-07-24 MED ORDER — FLUTICASONE-UMECLIDIN-VILANT 100-62.5-25 MCG/INH IN AEPB: 1.0000 | INHALATION_SPRAY | Freq: Every day | RESPIRATORY_TRACT | Status: DC

## 2017-07-24 MED ORDER — SENNOSIDES-DOCUSATE SODIUM 8.6-50 MG PO TABS
1.0000 | ORAL_TABLET | Freq: Two times a day (BID) | ORAL | Status: DC
  Administered 2017-07-24 – 2017-07-25 (×4): 1 via ORAL
  Filled 2017-07-24 (×4): qty 1

## 2017-07-24 MED ORDER — METOPROLOL TARTRATE 25 MG PO TABS
12.5000 mg | ORAL_TABLET | Freq: Two times a day (BID) | ORAL | Status: DC
  Administered 2017-07-24 – 2017-07-25 (×4): 12.5 mg via ORAL
  Filled 2017-07-24 (×4): qty 1

## 2017-07-24 MED ORDER — BUDESONIDE 0.25 MG/2ML IN SUSP
0.2500 mg | Freq: Two times a day (BID) | RESPIRATORY_TRACT | Status: DC
  Administered 2017-07-24 – 2017-07-25 (×3): 0.25 mg via RESPIRATORY_TRACT
  Filled 2017-07-24 (×3): qty 2

## 2017-07-24 MED ORDER — LORATADINE 10 MG PO TABS
10.0000 mg | ORAL_TABLET | Freq: Every day | ORAL | Status: DC
  Administered 2017-07-24 – 2017-07-25 (×2): 10 mg via ORAL
  Filled 2017-07-24 (×2): qty 1

## 2017-07-24 MED ORDER — GUAIFENESIN ER 600 MG PO TB12
600.0000 mg | ORAL_TABLET | Freq: Two times a day (BID) | ORAL | Status: DC | PRN
  Administered 2017-07-24: 600 mg via ORAL
  Filled 2017-07-24: qty 1

## 2017-07-24 MED ORDER — IPRATROPIUM BROMIDE 0.02 % IN SOLN
0.5000 mg | Freq: Three times a day (TID) | RESPIRATORY_TRACT | Status: DC
  Administered 2017-07-24 – 2017-07-25 (×4): 0.5 mg via RESPIRATORY_TRACT
  Filled 2017-07-24 (×4): qty 2.5

## 2017-07-24 MED ORDER — ONDANSETRON HCL 4 MG/2ML IJ SOLN
4.0000 mg | Freq: Four times a day (QID) | INTRAMUSCULAR | Status: DC | PRN
  Administered 2017-07-24 – 2017-07-25 (×3): 4 mg via INTRAVENOUS
  Filled 2017-07-24 (×3): qty 2

## 2017-07-24 MED ORDER — ENSURE ENLIVE PO LIQD
237.0000 mL | Freq: Three times a day (TID) | ORAL | Status: DC
  Administered 2017-07-24 – 2017-07-25 (×2): 237 mL via ORAL

## 2017-07-24 MED ORDER — APIXABAN 5 MG PO TABS: 5.0000 mg | ORAL_TABLET | Freq: Two times a day (BID) | ORAL | Status: DC

## 2017-07-24 MED ORDER — OXYCODONE HCL 5 MG PO TABS
10.0000 mg | ORAL_TABLET | ORAL | Status: DC | PRN
  Administered 2017-07-24 (×4): 10 mg via ORAL
  Filled 2017-07-24 (×4): qty 2

## 2017-07-24 MED ORDER — NICOTINE 21 MG/24HR TD PT24
21.0000 mg | MEDICATED_PATCH | Freq: Every day | TRANSDERMAL | Status: DC
  Filled 2017-07-24 (×2): qty 1

## 2017-07-24 MED ORDER — FERROUS SULFATE 325 (65 FE) MG PO TABS
325.0000 mg | ORAL_TABLET | Freq: Two times a day (BID) | ORAL | Status: DC
  Administered 2017-07-24: 325 mg via ORAL
  Filled 2017-07-24 (×2): qty 1

## 2017-07-24 MED ORDER — IPRATROPIUM BROMIDE 0.02 % IN SOLN: 0.5000 mg | RESPIRATORY_TRACT | Status: DC

## 2017-07-24 MED ORDER — LEVALBUTEROL HCL 1.25 MG/0.5ML IN NEBU
1.2500 mg | INHALATION_SOLUTION | Freq: Three times a day (TID) | RESPIRATORY_TRACT | Status: DC
  Administered 2017-07-24 – 2017-07-25 (×4): 1.25 mg via RESPIRATORY_TRACT
  Filled 2017-07-24 (×4): qty 0.5

## 2017-07-24 MED ORDER — SODIUM CHLORIDE 0.9% FLUSH
10.0000 mL | INTRAVENOUS | Status: DC | PRN
  Administered 2017-07-24 (×2): 10 mL
  Administered 2017-07-25: 20 mL
  Administered 2017-07-25: 10 mL
  Filled 2017-07-24 (×4): qty 40

## 2017-07-24 MED ORDER — PANTOPRAZOLE SODIUM 40 MG PO TBEC
40.0000 mg | DELAYED_RELEASE_TABLET | Freq: Two times a day (BID) | ORAL | Status: DC
  Administered 2017-07-24 – 2017-07-25 (×4): 40 mg via ORAL
  Filled 2017-07-24 (×4): qty 1

## 2017-07-24 MED ORDER — CHLORPHEN-PHENYLEPH-ASA 2-7.8-325 MG PO TBEF: EFFERVESCENT_TABLET | Freq: Every evening | ORAL | Status: DC | PRN

## 2017-07-24 MED ORDER — GABAPENTIN 100 MG PO CAPS
100.0000 mg | ORAL_CAPSULE | Freq: Three times a day (TID) | ORAL | Status: DC
  Administered 2017-07-24 – 2017-07-25 (×4): 100 mg via ORAL
  Filled 2017-07-24 (×4): qty 1

## 2017-07-24 MED ORDER — ACETAMINOPHEN 325 MG PO TABS: 650.0000 mg | ORAL_TABLET | Freq: Four times a day (QID) | ORAL | Status: DC | PRN

## 2017-07-24 MED ORDER — FOLIC ACID 1 MG PO TABS
1.0000 mg | ORAL_TABLET | Freq: Every day | ORAL | Status: DC
  Administered 2017-07-24 – 2017-07-25 (×2): 1 mg via ORAL
  Filled 2017-07-24 (×2): qty 1

## 2017-07-24 MED ORDER — ESCITALOPRAM OXALATE 10 MG PO TABS
5.0000 mg | ORAL_TABLET | Freq: Every day | ORAL | Status: DC
  Administered 2017-07-24 (×2): 5 mg via ORAL
  Filled 2017-07-24 (×2): qty 1

## 2017-07-24 MED ORDER — SUCRALFATE 1 GM/10ML PO SUSP
1.0000 g | Freq: Three times a day (TID) | ORAL | Status: DC
  Administered 2017-07-24 – 2017-07-25 (×5): 1 g via ORAL
  Filled 2017-07-24 (×5): qty 10

## 2017-07-24 NOTE — Progress Notes (Signed)
Educated patient on incentive spirometer use and patient stated "get this off my table and put this over there I ain't never had to do any of this before". Patient also stated ''I will tell the doctor myself I ain't using that thing".

## 2017-07-24 NOTE — Plan of Care (Signed)

## 2017-07-24 NOTE — Plan of Care (Signed)

## 2017-07-24 NOTE — H&P (Addendum)
History and Physical    [name]:[id number] DOB: 1960-04-16 DOA: 07/23/2017  Referring MD/NP/PA:   PCP: [name], MD   Patient coming from:  The patient is coming from home.  At baseline, pt is independent for most of ADL  Chief Complaint: SOB  HPI: [name] is a 57 y.o. 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  Patient states that he has worsening shortness of breath and cough in the past 2 or 3 days, which has been progressively getting worse.  He coughs up yellow-colored mucus, no hemoptysis.  Patient denies fever, but has chills.  He states that sometimes he has chest pain, but currently no chest pain.  Patient states that he has chronic intermittent nausea, vomiting loose stool bowel movement and chronic abdominal pain, which have not changed.  Denies symptoms of UTI.  He did not notice any dark stool or bloody stool recently.  No symptoms of UTI or unilateral weakness.  ED physician, patient had wheezing initially, currently patient does not have wheezing.  ED Course: pt was found to have WBC 4.4, hemoglobin 7.0 which was 8.7 on 07/19/2017, BNP 49.6, pending BMP, temperature normal, tachycardia, tachypnea, oxygen saturation 98% on 8 L nasal cannula oxygen.  Chest x-ray showed right small pleural effusion and COPD without infiltration.  Patient is admitted to telemetry bed as inpatient.  Review of Systems:   General: no fevers, has chills, no body weight gain, has poor appetite, has fatigue HEENT: no blurry vision, hearing changes or sore throat Respiratory: has dyspnea, coughing (pt has wheezing  earlier by ED physician) CV: no chest pain, no palpitations GI: Has chronic intermittent nausea, vomiting, abdominal pain, diarrhea, constipation GU: no dysuria,  burning on urination, increased urinary frequency, hematuria  Ext: has mild leg edema Neuro: no unilateral weakness, numbness, or tingling, no vision change or hearing loss Skin: no rash, no skin tear. MSK: No muscle spasm, no deformity, no limitation of range of movement in spin Heme: No easy bruising.  Travel history: No recent long distant travel.  Allergy: No Known Allergies  Past Medical History:  Diagnosis Date  . Abdominal pain 06/04/2016  . Adenocarcinoma of left lung, stage 4 ([place]) 05/02/2016  . Alcohol abuse   . Bronchitis due to tobacco use ([place])   . Cancer ([place])    Lung  . COPD (chronic obstructive pulmonary disease) ([place])   . Dehydration 06/04/2016  . Encounter for antineoplastic chemotherapy 05/02/2016  . Gastritis   . Goals of care, counseling/discussion 05/02/2016  . Hematemesis   . HTN (hypertension) 10/30/2016  . Recurrent upper respiratory infection (URI)   . Seizures ([place] 05/2011   new onset  . Shortness of breath     Past Surgical History:  Procedure Laterality Date  . ESOPHAGOGASTRODUODENOSCOPY (EGD) WITH PROPOFOL N/A 12/17/2016   Procedure: ESOPHAGOGASTRODUODENOSCOPY (EGD) WITH PROPOFOL;  Surgeon: Ladene Artist, MD;  Location: [facility];  Service: Endoscopy;  Laterality: N/A;  . IR FLUORO GUIDE PORT INSERTION RIGHT  05/13/2016  . IR US GUIDE VASC ACCESS RIGHT  05/13/2016  . NO PAST SURGERIES      Social History:  reports that he has been smoking cigarettes.  He has a 3.00 pack-year smoking history. He has never used smokeless tobacco. He reports that he does not drink  alcohol or use drugs.  Family History:  Family History  Problem Relation Age of Onset  . Cancer Father   . Diabetes Mellitus II Mother      Prior to Admission medications   Medication Sig Start Date End Date Taking? Authorizing Provider  acetaminophen (TYLENOL) 500 MG tablet Take 1 tablet (500 mg total) by mouth 2 (two) times daily as needed for mild pain. 07/07/17   Florencia Reasons,  MD  albuterol (PROVENTIL) (2.5 MG/3ML) 0.083% nebulizer solution 1 neb every 4-6 hours as needed for wheezing and shortness of breath 04/01/17   Parrett, Fonnie Mu, NP  apixaban (ELIQUIS) 5 MG TABS tablet Take 1 tablet (5 mg total) by mouth 2 (two) times daily. Patient taking differently: Take 10 mg by mouth 2 (two) times daily.  07/08/17   Florencia Reasons, MD  Chlorphen-Phenyleph-ASA (ALKA-SELTZER PLUS COLD PO) Take 2 tablets by mouth at bedtime as needed (COUGH).    [provider]  dexamethasone (DECADRON) 4 MG tablet Take 4 mg by mouth 2 (two) times daily. The day before, the day of, and the day after chemotherapy every three weeks    [provider]  Dextromethorphan-guaiFENesin (TUSSIN DM) 10-100 MG/5ML liquid Take 5 mLs by mouth every 12 (twelve) hours.    [provider]  escitalopram (LEXAPRO) 5 MG tablet Take 1 tablet (5 mg total) by mouth at bedtime. 04/17/17   Rosita Fire, MD  feeding supplement, ENSURE ENLIVE, (ENSURE ENLIVE) LIQD Take 237 mLs by mouth 3 (three) times daily between meals. 03/19/17   Shelly Coss, MD  ferrous sulfate 325 (65 FE) MG tablet Take 1 tablet (325 mg total) by mouth 2 (two) times daily with a meal. 07/12/17   Lavina Hamman, MD  Fluticasone-Umeclidin-Vilant (TRELEGY ELLIPTA) 100-62.5-25 MCG/INH AEPB Inhale 1 puff into the lungs daily. 05/31/17   Aline August, MD  folic acid (FOLVITE) 1 MG tablet Take 1 tablet (1 mg total) by mouth daily. 07/13/17   Lavina Hamman, MD  furosemide (LASIX) 40 MG tablet Take 1 tablet (40 mg total) by mouth daily. 07/13/17   Lavina Hamman, MD  gabapentin (NEURONTIN) 100 MG capsule Take 1 capsule (100 mg total) by mouth 3 (three) times daily. 07/12/17   Lavina Hamman, MD  guaiFENesin (MUCINEX) 600 MG 12 hr tablet Take 1,200 mg by mouth 2 (two) times daily.     [provider]  Ipratropium-Albuterol (COMBIVENT) 20-100 MCG/ACT AERS respimat Inhale 1 puff into the lungs every 6 (six) hours. 05/31/17    Aline August, MD  loratadine (CLARITIN) 10 MG tablet Take 10 mg by mouth daily.    [provider]  metoprolol tartrate (LOPRESSOR) 25 MG tablet Take 12.5 mg by mouth 2 (two) times daily.     [provider]  ondansetron (ZOFRAN-ODT) 4 MG disintegrating tablet Take 4 mg by mouth every 8 (eight) hours as needed for nausea or vomiting.    [provider]  Oxycodone HCl 10 MG TABS Take 1 tablet (10 mg total) by mouth every 4 (four) hours as needed for up to 15 doses (pain/shortness of breath). 07/13/17   McDonald, Mia A, PA-C  pantoprazole (PROTONIX) 40 MG tablet Take 1 tablet (40 mg total) by mouth 2 (two) times daily. 12/18/16   Eugenie Filler, MD  predniSONE (DELTASONE) 10 MG tablet Take 10 mg by mouth daily with breakfast.    [provider]  senna-docusate (SENOKOT-S) 8.6-50 MG tablet Take 2 tablets by mouth 2 (two)  times daily. Patient taking differently: Take 1 tablet by mouth 2 (two) times daily.  07/12/17   Lavina Hamman, MD  sucralfate (CARAFATE) 1 GM/10ML suspension Take 10 mLs (1 g total) by mouth 4 (four) times daily -  with meals and at bedtime. 07/12/17   Lavina Hamman, MD    Physical Exam: Vitals:   07/24/17 0114 07/24/17 0130 07/24/17 0200 07/24/17 0237  BP:  130/83 135/81 133/83  Pulse:  (!) 106 (!) 102 99  Resp:  (!) 24 (!) 25 (!) 26  Temp:    97.8 F (36.6 C)  TempSrc:    Oral  SpO2: 99% 98% 97% 99%  Weight:    51.7 kg (113 lb 15.7 oz)  Height:    5\' 11"  (1.803 m)   General: Not in acute distress HEENT:       Eyes: PERRL, EOMI, no scleral icterus.       ENT: No discharge from the ears and nose, no pharynx injection, no tonsillar enlargement.        Neck: No JVD, no bruit, no mass felt. Heme: No neck lymph node enlargement. Cardiac: S1/S2, RRR, No murmurs, No gallops or rubs. Respiratory: has rhonchi bilaterally.  GI: Soft, nondistended, has diffused tenderness, no rebound pain, no organomegaly, BS present. GU: No  hematuria Ext: has trace pitting leg edema bilaterally. 2+DP/PT pulse bilaterally. Musculoskeletal: No joint deformities, No joint redness or warmth, no limitation of ROM in spin. Skin: No rashes.  Neuro: Alert, oriented X3, cranial nerves II-XII grossly intact, moves all extremities normally.  Psych: Patient is not psychotic, no suicidal or hemocidal ideation.  Labs on Admission: I have personally reviewed following labs and imaging studies  CBC: Recent Labs  Lab 07/19/17 1445 07/23/17 2321  WBC 17.4* 4.4  NEUTROABS 17.0* 4.0  HGB 8.7* 7.0*  HCT 26.4* 21.3*  MCV 101.5* 98.6  PLT 225 397*   Basic Metabolic Panel: Recent Labs  Lab 07/19/17 1445  NA 143  K 3.1*  CL 100  CO2 32  GLUCOSE 129*  BUN 33*  CREATININE 1.38*  CALCIUM 9.0   GFR: Estimated Creatinine Clearance: 43.2 mL/min (A) (by C-G formula based on SCr of 1.38 mg/dL (H)). Liver Function Tests: No results for input(s): AST, ALT, ALKPHOS, BILITOT, PROT, ALBUMIN in the last 168 hours. No results for input(s): LIPASE, AMYLASE in the last 168 hours. No results for input(s): AMMONIA in the last 168 hours. Coagulation Profile: No results for input(s): INR, PROTIME in the last 168 hours. Cardiac Enzymes: Recent Labs  Lab 07/19/17 1445  TROPONINI 0.05*   BNP (last 3 results) No results for input(s): PROBNP in the last 8760 hours. HbA1C: No results for input(s): HGBA1C in the last 72 hours. CBG: No results for input(s): GLUCAP in the last 168 hours. Lipid Profile: No results for input(s): CHOL, HDL, LDLCALC, TRIG, CHOLHDL, LDLDIRECT in the last 72 hours. Thyroid Function Tests: No results for input(s): TSH, T4TOTAL, FREET4, T3FREE, THYROIDAB in the last 72 hours. Anemia Panel: No results for input(s): VITAMINB12, FOLATE, FERRITIN, TIBC, IRON, RETICCTPCT in the last 72 hours. Urine analysis:    Component Value Date/Time   COLORURINE YELLOW 07/16/2017 [place] 07/16/2017 1614   LABSPEC  1.011 07/16/2017 1614   PHURINE 6.0 07/16/2017 1614   GLUCOSEU NEGATIVE 07/16/2017 1614   HGBUR NEGATIVE 07/16/2017 1614   BILIRUBINUR SMALL (A) 07/16/2017 1614   KETONESUR NEGATIVE 07/16/2017 1614   PROTEINUR NEGATIVE 07/16/2017 1614   UROBILINOGEN 0.2  05/29/2011 0122   NITRITE NEGATIVE 07/16/2017 1614   LEUKOCYTESUR NEGATIVE 07/16/2017 1614   Sepsis Labs: @LABRCNTIP (procalcitonin:4,lacticidven:4) )No results found for this or any previous visit (from the past 240 hour(s)).   Radiological Exams on Admission: Dg Chest Portable 1 View  Result Date: 07/23/2017 CLINICAL DATA:  57 y/o M; shortness of breath and history of lung cancer. EXAM: PORTABLE CHEST 1 VIEW COMPARISON:  07/20/2017 chest radiograph. FINDINGS: Stable right port catheter with tip projecting over lower SVC. Stable heart size. Stable left upper lobe spiculated nodule. Stable small to moderate right pleural effusion. No new focal consolidation. No pneumothorax. Bones are unremarkable. Stable findings of COPD. IMPRESSION: Stable COPD, small to moderate right pleural effusion, and left upper lobe spiculated nodule. Electronically Signed   By: Kristine Garbe M.D.   On: 07/23/2017 23:41     EKG: Independently reviewed.  Sinus rhythm, QTC 476, LAD, poor R wave progression, nonspecific T wave change.   Assessment/Plan Principal Problem:   Acute on chronic respiratory failure with hypoxia (HCC) Active Problems:   Tobacco abuse   Adenocarcinoma of left lung, stage 4 (HCC)   Abdominal pain   HTN (hypertension)   COPD exacerbation (HCC)   CKD (chronic kidney disease), stage III (HCC)   PE (pulmonary thromboembolism) (HCC)   Normocytic anemia   Chronic diastolic (congestive) heart failure (HCC)   Acute on chronic respiratory failure with hypoxia and COPD exacerbation: Patient's shortness of breath is likely multifactorial etiology, including COPD exacerbation given rhonchi on auscultation (patient also had wheezing  initially per ED physician), worsening anemia, history of PE, and dCHF.   -will admit patient to telemetry bed  -Nebulizers: scheduled Atrovent and prn Xopenex Nebs -Solu-Medrol 60 mg IV bid -Z pak -Mucinex for cough  -Incentive spirometry -Urine S. pneumococcal antigen -Follow up blood culture x2, sputum culture, respiratory virus panel -Nasal cannula oxygen as needed to maintain O2 saturation 92% or greater  Chronic diastolic (congestive) heart failure: 2D echo on 07/01/2017 showed EF of 55 to 60% with grade 1 diastolic dysfunction.  Patient has trace leg edema, but no pulmonary edema chest x-ray.  BNP is normal at 49.6, CHF seems to be compensated.:  -Continue home Lasix 40 mg daily -continue metoprolol -will give 20 mg of lasix before transfusing 1 unit of blood  Normocytic anemia: Hemoglobin dropped from 8.7 to 7.0.  Patient does not have bloody or dark stool.  Likely due to chemotherapy. -check FOBT since pt is on Eliquis -Transfuse 1 unit of blood - INR/PTT/type & screen  Tobacco abuse: -nicotine patch  Adenocarcinoma of left lung, stage 4 [facility]): Patient is a followed up with [name]. On palliative chemotherapy, dose was last Tuesday. -Follow-up with [name]  Abdominal pain: This is a chronic issue.  Patient had CT abdomen/pelvis on 07/03/2017, which showed wall thickening of distal gastric antrum pylorus, could reflect gastritis. -Patient is on Protonix 40 mg twice daily  HTN:  -Continue home medications: Metoprolol and Lasix -IV hydralazine prn  CKD (chronic kidney disease), stage III ([place]): Close to baseline.  Baseline creatinine 1.3-1.6.  His creatinine is 1.62, BUN 25. -f/u by  BMP.  PE (pulmonary thromboembolism) ([place]) -continue Eliquis per pharmacist    DVT ppx: onm Eliquis Code Status: DNR (I discussed with patient, and explained the meaning of CODE STATUS in detail. Patient wants to be DNR): Family Communication: None at bed side.   Disposition Plan:   Anticipate discharge back to previous home environment: Consults called:  none Admission  status:   Inpatient/tele     Date of Service 07/24/2017    Ivor Costa [facility] Pager [phone]  If 7PM-7AM, please contact night-coverage [email or web address] Password [email or web address] 07/24/2017, 4:16 AM

## 2017-07-24 NOTE — Progress Notes (Signed)
PROGRESS NOTE    [name]  EPP:[id number] DOB: 08/22/1960 DOA: 07/23/2017 PCP: [name], MD    Brief Narrative:  57 y.o. 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  Patient states that he has worsening shortness of breath and cough in the past 2 or 3 days, which has been progressively getting worse.  He coughs up yellow-colored mucus, no hemoptysis.  Patient denies fever, but has chills.  He states that sometimes he has chest pain, but currently no chest pain.  Patient states that he has chronic intermittent nausea, vomiting loose stool bowel movement and chronic abdominal pain, which have not changed.  Denies symptoms of UTI.  He did not notice any dark stool or bloody stool recently.  No symptoms of UTI or unilateral weakness.  ED physician, patient had wheezing initially, currently patient does not have wheezing.  ED Course: pt was found to have WBC 4.4, hemoglobin 7.0 which was 8.7 on 07/19/2017, BNP 49.6, pending BMP, temperature normal, tachycardia, tachypnea, oxygen saturation 98% on 8 L nasal cannula oxygen.  Chest x-ray showed right small pleural effusion and COPD without infiltration.  Assessment & Plan:   Principal Problem:   Acute on chronic respiratory failure with hypoxia (HCC) Active Problems:   Tobacco abuse   Adenocarcinoma of left lung, stage 4 (HCC)   Abdominal pain   HTN (hypertension)   COPD exacerbation (HCC)   CKD (chronic kidney disease), stage III (HCC)   PE (pulmonary thromboembolism) (HCC)   Normocytic anemia   Chronic diastolic (congestive) heart failure (HCC)  Acute on chronic respiratory failure with hypoxia and COPD exacerbation:  -Patient reports symptoms began while facility was being painted with heavy fumes present -Suspect above fumes  to be etiology for copd exacerbation -Continue scheduled Atrovent and prn Xopenex Nebs -Continue steroids as tolerated. Currently on baseline 2LNC. Will begin weaning O2 -Urine S. pneumococcal antigen neg -Respiratory viral panel neg  Chronic diastolic (congestive) heart failure:  -2D echo on 07/01/2017 showed EF of 55 to 60% with grade 1 diastolic dysfunction.  Patient has trace leg edema, but no pulmonary edema chest x-ray.  BNP is normal at 49.6, CHF seems to be compensated.:  -Continue home Lasix 40 mg daily -continue metoprolol as tolerated -continue to monitor I/o closely  Normocytic anemia:  -Hemoglobin noted to have dropped from 8.7 to 7.0.  Patient does not have bloody or dark stool. Suspect secondary to chemotherapy. -Transfused 1 unit of blood -Hgb noted to be 3.7 overnight with repeat of 7.7, question lab error  Tobacco abuse: -nicotine patch continued  Adenocarcinoma of left lung, stage 4 [facility]): Patient is a followed up with [name]. On palliative chemotherapy, dose was last Tuesday. -Recommend close follow-up with [name]  Abdominal pain: This is a chronic issue.  Patient had CT abdomen/pelvis on 07/03/2017, which showed wall thickening of distal gastric antrum pylorus, could reflect gastritis. -continue patient on Protonix 40 mg twice daily as tolerated  HTN:  -Continue home medications: Metoprolol and Lasix -Will continue with IV hydralazine as needed  CKD (chronic kidney disease), stage III ([place]): Close to baseline.  Baseline creatinine 1.3-1.6.  His creatinine is 1.62, BUN 25. -Will repeat bmet in AM.  PE (pulmonary thromboembolism) ([place]) -eliquis held secondary to low hgb -Repeat cbc in AM and if improved  or stable, would resume eliquis  DVT prophylaxis: SCD's Code Status: DNR Family Communication: Pt in room, family not at bedside Disposition Plan: Uncertain at this time  Consultants:     Procedures:      Antimicrobials: Anti-infectives (From admission, onward)   Start     Dose/Rate Route Frequency Ordered Stop   07/24/17 0201  azithromycin (ZITHROMAX) tablet 250 mg     250 mg Oral Daily at bedtime 07/24/17 0150 07/28/17 2159   07/24/17 0200  azithromycin (ZITHROMAX) tablet 500 mg     500 mg Oral Daily 07/24/17 0150 07/24/17 0336       Subjective: Reports feeling better today  Objective: Vitals:   07/24/17 0650 07/24/17 0811 07/24/17 0915 07/24/17 1353  BP: 131/68  138/82 117/66  Pulse: 94  73 64  Resp: (!) 24  20 18   Temp: 97.8 F (36.6 C)  97.9 F (36.6 C) 97.9 F (36.6 C)  TempSrc: Oral  Oral Oral  SpO2: 100% 99% 99% 100%  Weight:      Height:        Intake/Output Summary (Last 24 hours) at 07/24/2017 1620 Last data filed at 07/24/2017 1430 Gross per 24 hour  Intake 853 ml  Output 1125 ml  Net -272 ml   Filed Weights   07/24/17 0237  Weight: 51.7 kg (113 lb 15.7 oz)    Examination:  General exam: Appears calm and comfortable  Respiratory system: Clear to auscultation. Respiratory effort normal. Cardiovascular system: S1 & S2 heard, RRR Gastrointestinal system: Abdomen is nondistended, soft and nontender. No organomegaly or masses felt. Normal bowel sounds heard. Central nervous system: Alert and oriented. No focal neurological deficits. Extremities: Symmetric 5 x 5 power. Skin: No rashes, lesions  Psychiatry: Judgement and insight appear normal. Mood & affect appropriate.   Data Reviewed: I have personally reviewed following labs and imaging studies  CBC: Recent Labs  Lab 07/19/17 1445 07/23/17 2321 07/24/17 0409 07/24/17 1116  WBC 17.4* 4.4 5.9 4.0  NEUTROABS 17.0* 4.0  --   --   HGB 8.7* 7.0* 3.7* 7.7*  HCT 26.4* 21.3* 11.2* 23.7*  MCV 101.5* 98.6 98.2 96.0  PLT 225 123* 125* 99*   Basic Metabolic Panel: Recent Labs  Lab 07/19/17 1445 07/24/17 0147 07/24/17 0409  NA 143 138  --   K 3.1* 3.5  --   CL 100 99  --   CO2 32 26  --    GLUCOSE 129* 265*  --   BUN 33* 25*  --   CREATININE 1.38* 1.62*  --   CALCIUM 9.0 8.9  --   MG  --   --  1.9   GFR: Estimated Creatinine Clearance: 36.8 mL/min (A) (by C-G formula based on SCr of 1.62 mg/dL (H)). Liver Function Tests: No results for input(s): AST, ALT, ALKPHOS, BILITOT, PROT, ALBUMIN in the last 168 hours. Recent Labs  Lab 07/24/17 0203  LIPASE 22   No results for input(s): AMMONIA in the last 168 hours. Coagulation Profile: Recent Labs  Lab 07/24/17 0147  INR 1.06   Cardiac Enzymes: Recent Labs  Lab 07/19/17 1445  TROPONINI 0.05*   BNP (last 3 results) No results for input(s): PROBNP in the last 8760 hours. HbA1C: No results for input(s): HGBA1C in the last 72 hours. CBG: No results for input(s): GLUCAP in the last 168 hours. Lipid Profile: No results for input(s): CHOL, HDL, LDLCALC, TRIG, CHOLHDL, LDLDIRECT in the last 72 hours. Thyroid Function Tests: No results for input(s):  TSH, T4TOTAL, FREET4, T3FREE, THYROIDAB in the last 72 hours. Anemia Panel: No results for input(s): VITAMINB12, FOLATE, FERRITIN, TIBC, IRON, RETICCTPCT in the last 72 hours. Sepsis Labs: No results for input(s): PROCALCITON, LATICACIDVEN in the last 168 hours.  Recent Results (from the past 240 hour(s))  Culture, blood (routine x 2) Call MD if unable to obtain prior to antibiotics being given     Status: None (Preliminary result)   Collection Time: 07/24/17  2:50 AM  Result Value Ref Range Status   Specimen Description   Final    BLOOD LEFT HAND Performed at [facility] [address] [place], [place] [address]    Special Requests   Final    BOTTLES DRAWN AEROBIC AND ANAEROBIC Blood Culture adequate volume   Culture PENDING  Incomplete   Report Status PENDING  Incomplete  Culture, blood (routine x 2) Call MD if unable to obtain prior to antibiotics being given     Status: None (Preliminary result)   Collection Time: 07/24/17  2:55 AM  Result  Value Ref Range Status   Specimen Description BLOOD RIGHT ARM  Final   Special Requests   Final    AEROBIC BOTTLE ONLY Blood Culture adequate volume Performed at [facility] [address] [place], [place] [address]    Culture PENDING  Incomplete   Report Status PENDING  Incomplete  Respiratory Panel by PCR     Status: None   Collection Time: 07/24/17  3:19 AM  Result Value Ref Range Status   Adenovirus NOT DETECTED NOT DETECTED Final   Coronavirus 229E NOT DETECTED NOT DETECTED Final   Coronavirus HKU1 NOT DETECTED NOT DETECTED Final   Coronavirus NL63 NOT DETECTED NOT DETECTED Final   Coronavirus OC43 NOT DETECTED NOT DETECTED Final   Metapneumovirus NOT DETECTED NOT DETECTED Final   Rhinovirus / Enterovirus NOT DETECTED NOT DETECTED Final   Influenza A NOT DETECTED NOT DETECTED Final   Influenza B NOT DETECTED NOT DETECTED Final   Parainfluenza Virus 1 NOT DETECTED NOT DETECTED Final   Parainfluenza Virus 2 NOT DETECTED NOT DETECTED Final   Parainfluenza Virus 3 NOT DETECTED NOT DETECTED Final   Parainfluenza Virus 4 NOT DETECTED NOT DETECTED Final   Respiratory Syncytial Virus NOT DETECTED NOT DETECTED Final   Bordetella pertussis NOT DETECTED NOT DETECTED Final   Chlamydophila pneumoniae NOT DETECTED NOT DETECTED Final   Mycoplasma pneumoniae NOT DETECTED NOT DETECTED Final    Comment: Performed at [facility], [place] [address], [place], [place] [address]  MRSA PCR Screening     Status: None   Collection Time: 07/24/17  4:50 AM  Result Value Ref Range Status   MRSA by PCR NEGATIVE NEGATIVE Final    Comment:        The GeneXpert MRSA Assay (FDA approved for NASAL specimens only), is one component of a comprehensive MRSA colonization surveillance program. It is not intended to diagnose MRSA infection nor to guide or monitor treatment for MRSA infections. Performed at [facility], [place] [address], [place], [place] [address]       Radiology Studies: Dg Chest Portable 1 View  Result Date: 07/23/2017 CLINICAL DATA:  57 y/o M; shortness of breath and history of lung cancer. EXAM: PORTABLE CHEST 1 VIEW COMPARISON:  07/20/2017 chest radiograph. FINDINGS: Stable right port catheter with tip projecting over lower SVC. Stable heart size. Stable left upper lobe spiculated nodule. Stable small to moderate right pleural effusion. No  new focal consolidation. No pneumothorax. Bones are unremarkable. Stable findings of COPD. IMPRESSION: Stable COPD, small to moderate right pleural effusion, and left upper lobe spiculated nodule. Electronically Signed   By: Kristine Garbe M.D.   On: 07/23/2017 23:41    Scheduled Meds: . azithromycin  250 mg Oral QHS  . budesonide (PULMICORT) nebulizer solution  0.25 mg Nebulization BID  . escitalopram  5 mg Oral QHS  . feeding supplement (ENSURE ENLIVE)  237 mL Oral TID BM  . ferrous sulfate  325 mg Oral BID WC  . folic acid  1 mg Oral Daily  . furosemide  40 mg Oral Daily  . gabapentin  100 mg Oral TID  . ipratropium  0.5 mg Nebulization TID  . levalbuterol  1.25 mg Nebulization TID  . loratadine  10 mg Oral Daily  . methylPREDNISolone (SOLU-MEDROL) injection  60 mg Intravenous Q12H  . metoprolol tartrate  12.5 mg Oral BID  . nicotine  21 mg Transdermal Daily  . pantoprazole  40 mg Oral BID  . senna-docusate  1 tablet Oral BID  . sucralfate  1 g Oral TID WC & HS  . umeclidinium-vilanterol  1 puff Inhalation Daily   Continuous Infusions:   LOS: 0 days   Marylu Lund, MD [facility] Pager [phone]  If 7PM-7AM, please contact night-coverage [email or web address] Password [email or web address] 07/24/2017, 4:20 PM

## 2017-07-24 NOTE — ED Notes (Signed)
ED TO INPATIENT HANDOFF REPORT  Name/Age/Gender [name] 57 y.o. male  Code Status    Code Status Orders  (From admission, onward)        Start     Ordered   07/24/17 0150  Do not attempt resuscitation (DNR)  Continuous    Question Answer Comment  In the event of cardiac or respiratory ARREST Do not call a "code blue"   In the event of cardiac or respiratory ARREST Do not perform Intubation, CPR, defibrillation or ACLS   In the event of cardiac or respiratory ARREST Use medication by any route, position, wound care, and other measures to relive pain and suffering. May use oxygen, suction and manual treatment of airway obstruction as needed for comfort.      07/24/17 0150    Code Status History    Date Active Date Inactive Code Status Order ID Comments User Context   07/11/2017 0304 07/13/2017 0224 DNR [id number]  Norval Morton, MD ED   07/04/2017 2356 07/09/2017 2151 DNR [id number]  Etta Quill, DO ED   06/30/2017 1015 07/04/2017 0215 DNR [id number]  Shelly Coss, MD ED   06/25/2017 1117 06/29/2017 1603 Full Code [id number]  Shelly Coss, MD ED   06/21/2017 1044 06/23/2017 2044 DNR [id number]  Mariel Aloe, MD ED   06/05/2017 1602 06/10/2017 2253 Full Code [id number]  Cristy Folks, MD Inpatient   05/28/2017 1147 05/31/2017 2012 DNR [id number]  Nita Sells, MD ED   04/10/2017 1148 04/17/2017 1837 Full Code [id number]  Damita Lack, MD ED   03/09/2017 2359 03/19/2017 1931 Full Code [id number]  Cristy Folks, MD Inpatient   02/14/2017 0902 02/16/2017 1455 Full Code [id number]  Florencia Reasons, MD Inpatient   02/10/2017 0031 02/14/2017 0902 DNR [id number]  Norval Morton, MD ED   12/30/2016 2325 01/07/2017 2109 DNR [id number]  Elodia [name]., MD Inpatient   12/17/2016 0934 12/24/2016 1811 DNR [id number]  Thurnell Lose, MD Inpatient   12/16/2016 1649 12/17/2016 0934 Full Code [id number]  Charlynne Cousins, MD Inpatient      Home/SNF/Other Nursing Home  Chief  Complaint Shortness of breath  Level of Care/Admitting Diagnosis ED Disposition    ED Disposition Condition [facility] Area: [facility] [[id number]]  Level of Care: Telemetry [5]  Admit to tele based on following criteria: Other see comments  Comments: SOB  Diagnosis: COPD exacerbation [facility]) [[id number]]  Admitting Physician: Ivor Costa [[id number]]  Attending Physician: Ivor Costa [phone]  Estimated length of stay: past midnight tomorrow  Certification:: I certify this patient will need inpatient services for at least 2 midnights  PT Class (Do Not Modify): Inpatient [101]  PT Acc Code (Do Not Modify): Private [1]       Medical History Past Medical History:  Diagnosis Date  . Abdominal pain 06/04/2016  . Adenocarcinoma of left lung, stage 4 ([place]) 05/02/2016  . Alcohol abuse   . Bronchitis due to tobacco use ([place])   . Cancer ([place])    Lung  . COPD (chronic obstructive pulmonary disease) ([place])   . Dehydration 06/04/2016  . Encounter for antineoplastic chemotherapy 05/02/2016  . Gastritis   . Goals of care, counseling/discussion 05/02/2016  . Hematemesis   . HTN (hypertension) 10/30/2016  . Recurrent upper respiratory infection (URI)   . Seizures ([place]) 05/2011   new onset  . Shortness of breath     Allergies No Known Allergies  IV Location/Drains/Wounds Patient Lines/Drains/Airways  Status   Active Line/Drains/Airways    Name:   Placement date:   Placement time:   Site:   Days:   Implanted Port 05/13/16 Right Chest   05/13/16    -    Chest   437          Labs/Imaging Results for orders placed or performed during the hospital encounter of 07/23/17 (from the past 48 hour(s))  CBC with Differential/Platelet     Status: Abnormal   Collection Time: 07/23/17 11:21 PM  Result Value Ref Range   WBC 4.4 4.0 - 10.5 K/uL   RBC 2.16 (L) 4.22 - 5.81 MIL/uL   Hemoglobin 7.0 (L) 13.0 - 17.0 g/dL   HCT 21.3 (L) 39.0 - 52.0 %   MCV 98.6 78.0 - 100.0 fL    MCH 32.4 26.0 - 34.0 pg   MCHC 32.9 30.0 - 36.0 g/dL   RDW 17.0 (H) 11.5 - 15.5 %   Platelets 123 (L) 150 - 400 K/uL   Neutrophils Relative % 91 %   Neutro Abs 4.0 1.7 - 7.7 K/uL   Lymphocytes Relative 6 %   Lymphs Abs 0.2 (L) 0.7 - 4.0 K/uL   Monocytes Relative 3 %   Monocytes Absolute 0.2 0.1 - 1.0 K/uL   Eosinophils Relative 0 %   Eosinophils Absolute 0.0 0.0 - 0.7 K/uL   Basophils Relative 0 %   Basophils Absolute 0.0 0.0 - 0.1 K/uL    Comment: Performed at [facility], [place] [address], [place], [place] [address]  Brain natriuretic peptide     Status: None   Collection Time: 07/23/17 11:22 PM  Result Value Ref Range   B Natriuretic Peptide 49.6 0.0 - 100.0 pg/mL    Comment: Performed at [facility], [place] [address], [place], [place] [address]   Dg Chest Portable 1 View  Result Date: 07/23/2017 CLINICAL DATA:  57 y/o M; shortness of breath and history of lung cancer. EXAM: PORTABLE CHEST 1 VIEW COMPARISON:  07/20/2017 chest radiograph. FINDINGS: Stable right port catheter with tip projecting over lower SVC. Stable heart size. Stable left upper lobe spiculated nodule. Stable small to moderate right pleural effusion. No new focal consolidation. No pneumothorax. Bones are unremarkable. Stable findings of COPD. IMPRESSION: Stable COPD, small to moderate right pleural effusion, and left upper lobe spiculated nodule. Electronically Signed   By: Kristine Garbe M.D.   On: 07/23/2017 23:41    Pending Labs Unresulted Labs (From admission, onward)   Start     Ordered   07/24/17 0500  CBC  Tomorrow morning,   R     07/24/17 0153   07/24/17 0203  Lipase, blood  Once,   R     07/24/17 0202   07/24/17 9163  Basic metabolic panel  Once,   R     07/24/17 0153   07/24/17 0148  Culture, blood (routine x 2) Call MD if unable to obtain prior to antibiotics being given  BLOOD CULTURE X 2,   R    Comments:  If blood cultures drawn in Emergency Department  - Do not draw and cancel order    07/24/17 0150   07/24/17 0148  Culture, sputum-assessment  Once,   R     07/24/17 0150   07/24/17 0148  Gram stain  Once,   R     07/24/17 0150   07/24/17 0148  HIV antibody (Routine Screening)  Once,   R     07/24/17  0150   07/24/17 0148  Strep pneumoniae urinary antigen  Once,   R     07/24/17 0150   07/24/17 0147  Protime-INR  Once,   R     07/24/17 0146   07/24/17 0147  APTT  Once,   R     07/24/17 0146   07/24/17 0147  Type and screen [facility]  Once,   R    Comments:  [place]    07/24/17 0146   07/24/17 0147  Prepare RBC  (Adult Blood Administration - Red Blood Cells)  Once,   R    Question Answer Comment  # of Units 1 unit   Transfusion Indications Symptomatic Anemia   If emergent release call blood bank Not emergent release      07/24/17 0146   07/24/17 0146  Respiratory Panel by PCR  (Respiratory virus panel)  Once,   R     07/24/17 0145   Unscheduled  Occult blood card to lab, stool  As needed,   R     07/24/17 0146      Vitals/Pain Today's Vitals   07/24/17 0100 07/24/17 0114 07/24/17 0130 07/24/17 0200  BP: 139/82  130/83 135/81  Pulse: (!) 104  (!) 106 (!) 102  Resp: (!) 22  (!) 24 (!) 25  Temp:      TempSrc:      SpO2: 100% 99% 98% 97%  PainSc:        Isolation Precautions Droplet precaution  Medications Medications  methylPREDNISolone sodium succinate (SOLU-MEDROL) 125 mg/2 mL injection 60 mg (has no administration in time range)  acetaminophen (TYLENOL) tablet 650 mg (has no administration in time range)  apixaban (ELIQUIS) tablet 10 mg (has no administration in time range)  escitalopram (LEXAPRO) tablet 5 mg (has no administration in time range)  feeding supplement (ENSURE ENLIVE) (ENSURE ENLIVE) liquid 237 mL (has no administration in time range)  ferrous sulfate tablet 325 mg (has no administration in time range)  Fluticasone-Umeclidin-Vilant 100-62.5-25 MCG/INH  AEPB 1 puff (has no administration in time range)  folic acid (FOLVITE) tablet 1 mg (has no administration in time range)  furosemide (LASIX) tablet 40 mg (has no administration in time range)  gabapentin (NEURONTIN) capsule 100 mg (has no administration in time range)  guaiFENesin (MUCINEX) 12 hr tablet 600 mg (has no administration in time range)  loratadine (CLARITIN) tablet 10 mg (has no administration in time range)  metoprolol tartrate (LOPRESSOR) tablet 12.5 mg (has no administration in time range)  oxyCODONE (Oxy IR/ROXICODONE) immediate release tablet 10 mg (has no administration in time range)  pantoprazole (PROTONIX) EC tablet 40 mg (has no administration in time range)  senna-docusate (Senokot-S) tablet 1 tablet (has no administration in time range)  sucralfate (CARAFATE) 1 GM/10ML suspension 1 g (has no administration in time range)  0.9 %  sodium chloride infusion (Manually program via Guardrails IV Fluids) (has no administration in time range)  nicotine (NICODERM CQ - dosed in mg/24 hours) patch 21 mg (has no administration in time range)  azithromycin (ZITHROMAX) tablet 500 mg (has no administration in time range)    Followed by  azithromycin (ZITHROMAX) tablet 250 mg (has no administration in time range)  furosemide (LASIX) injection 20 mg (has no administration in time range)  ipratropium (ATROVENT) nebulizer solution 0.5 mg (has no administration in time range)  levalbuterol (XOPENEX) nebulizer solution 1.25 mg (has no administration in time range)  albuterol (PROVENTIL) (2.5 MG/3ML) 0.083% nebulizer  solution 5 mg (5 mg Nebulization Given 07/23/17 2343)  ipratropium (ATROVENT) nebulizer solution 0.5 mg (0.5 mg Nebulization Given 07/23/17 2343)  methylPREDNISolone sodium succinate (SOLU-MEDROL) 125 mg/2 mL injection 125 mg (125 mg Intravenous Given 07/24/17 0014)  albuterol (PROVENTIL) (2.5 MG/3ML) 0.083% nebulizer solution 5 mg (5 mg Nebulization Given 07/24/17 0114)

## 2017-07-24 NOTE — Progress Notes (Signed)
Paged Dr. Hilbert Bible about critical Hemoglobin.

## 2017-07-24 NOTE — Progress Notes (Signed)
Patient refuses to wear continuous pulse ox probe. Education provided. He states, "I don't need that."

## 2017-07-24 NOTE — ED Provider Notes (Signed)
[facility] Provider Note   CSN: [id number] Arrival date & time: 07/23/17  2254     History   Chief Complaint No chief complaint on file.   HPI [name] is a 57 y.o. male.  The history is provided by the patient.  Shortness of Breath  This is a recurrent problem. The problem occurs continuously.The current episode started more than 2 days ago. The problem has been gradually worsening. Associated symptoms include cough and wheezing. Pertinent negatives include no fever, no rhinorrhea, no sore throat, no swollen glands, no orthopnea, no syncope, no abdominal pain, no rash and no claudication. The problem's precipitants include medical treatment. He has tried beta-agonist inhalers for the symptoms. The treatment provided no relief. He has had prior hospitalizations. He has had prior ED visits. Associated medical issues include COPD and chronic lung disease.    Past Medical History:  Diagnosis Date  . Abdominal pain 06/04/2016  . Adenocarcinoma of left lung, stage 4 ([place]) 05/02/2016  . Alcohol abuse   . Bronchitis due to tobacco use ([place])   . Cancer ([place])    Lung  . COPD (chronic obstructive pulmonary disease) ([place])   . Dehydration 06/04/2016  . Encounter for antineoplastic chemotherapy 05/02/2016  . Gastritis   . Goals of care, counseling/discussion 05/02/2016  . Hematemesis   . HTN (hypertension) 10/30/2016  . Recurrent upper respiratory infection (URI)   . Seizures ([place]) 05/2011   new onset  . Shortness of breath     Patient Active Problem List   Diagnosis Date Noted  . Anemia 07/24/2017  . Normocytic anemia 07/24/2017  . Chronic diastolic (congestive) heart failure ([place]) 07/24/2017  . Recurrent pleural effusion on right 07/15/2017  . Acute on chronic respiratory failure ([place]) 07/11/2017  . History of pulmonary embolism 07/11/2017  . PE (pulmonary thromboembolism) ([place]) 06/30/2017  . Pulmonary embolism ([place]) 06/30/2017    . AKI (acute kidney injury) ([place]) 06/25/2017  . Dyspnea 06/21/2017  . Chronic combined systolic (congestive) and diastolic (congestive) heart failure ([place])   . Constipation 06/07/2017  . Pain   . Acute on chronic respiratory failure with hypoxia ([place]) 05/28/2017  . Chronic respiratory failure with hypoxia ([place]) 04/01/2017  . Leucocytosis 03/16/2017  . CKD (chronic kidney disease), stage III ([place]) 03/12/2017  . Malnutrition of moderate degree 03/10/2017  . COPD exacerbation ([place]) 03/09/2017  . Macrocytic anemia 02/07/2017  . Severe malnutrition ([place]) 01/03/2017  . DOE (dyspnea on exertion) 12/30/2016  . Nausea   . Hypervolemia   . HCAP (healthcare-associated pneumonia) 12/19/2016  . SOB (shortness of breath) 12/19/2016  . Erosive gastropathy 12/18/2016  . Gastritis 12/18/2016  . Hematemesis 12/16/2016  . Intractable nausea and vomiting 12/16/2016  . HTN (hypertension) 10/30/2016  . Chronic obstructive pulmonary disease ([place]) 09/02/2016  . COPD (chronic obstructive pulmonary disease) ([place]) 08/19/2016  . Dehydration 06/04/2016  . Abdominal pain 06/04/2016  . 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 Adenocarcinoma of left lung, stage 4 ([place]) 05/02/2016  . Goals of care, counseling/discussion 05/02/2016  . Encounter for antineoplastic chemotherapy 05/02/2016  . Tobacco abuse 05/30/2011  . Alcohol abuse 05/30/2011  . Seizure ([place]) 05/28/2011    Past Surgical History:  Procedure Laterality Date  . ESOPHAGOGASTRODUODENOSCOPY (EGD) WITH PROPOFOL N/A 12/17/2016   Procedure: ESOPHAGOGASTRODUODENOSCOPY (EGD) WITH PROPOFOL;  Surgeon: Ladene Artist, MD;  Location: [facility];  Service: Endoscopy;  Laterality: N/A;  . IR FLUORO GUIDE PORT INSERTION RIGHT  05/13/2016  . IR US GUIDE VASC  ACCESS RIGHT  05/13/2016  . NO PAST SURGERIES          Home Medications    Prior to Admission medications   Medication Sig Start Date End Date Taking? Authorizing Provider  acetaminophen (TYLENOL)  500 MG tablet Take 1 tablet (500 mg total) by mouth 2 (two) times daily as needed for mild pain. 07/07/17   Florencia Reasons, MD  albuterol (PROVENTIL) (2.5 MG/3ML) 0.083% nebulizer solution 1 neb every 4-6 hours as needed for wheezing and shortness of breath 04/01/17   Parrett, Fonnie Mu, NP  apixaban (ELIQUIS) 5 MG TABS tablet Take 1 tablet (5 mg total) by mouth 2 (two) times daily. Patient taking differently: Take 10 mg by mouth 2 (two) times daily.  07/08/17   Florencia Reasons, MD  Chlorphen-Phenyleph-ASA (ALKA-SELTZER PLUS COLD PO) Take 2 tablets by mouth at bedtime as needed (COUGH).    [provider]  dexamethasone (DECADRON) 4 MG tablet Take 4 mg by mouth 2 (two) times daily. The day before, the day of, and the day after chemotherapy every three weeks    [provider]  Dextromethorphan-guaiFENesin (TUSSIN DM) 10-100 MG/5ML liquid Take 5 mLs by mouth every 12 (twelve) hours.    [provider]  escitalopram (LEXAPRO) 5 MG tablet Take 1 tablet (5 mg total) by mouth at bedtime. 04/17/17   Rosita Fire, MD  feeding supplement, ENSURE ENLIVE, (ENSURE ENLIVE) LIQD Take 237 mLs by mouth 3 (three) times daily between meals. 03/19/17   Shelly Coss, MD  ferrous sulfate 325 (65 FE) MG tablet Take 1 tablet (325 mg total) by mouth 2 (two) times daily with a meal. 07/12/17   Lavina Hamman, MD  Fluticasone-Umeclidin-Vilant (TRELEGY ELLIPTA) 100-62.5-25 MCG/INH AEPB Inhale 1 puff into the lungs daily. 05/31/17   Aline August, MD  folic acid (FOLVITE) 1 MG tablet Take 1 tablet (1 mg total) by mouth daily. 07/13/17   Lavina Hamman, MD  furosemide (LASIX) 40 MG tablet Take 1 tablet (40 mg total) by mouth daily. 07/13/17   Lavina Hamman, MD  gabapentin (NEURONTIN) 100 MG capsule Take 1 capsule (100 mg total) by mouth 3 (three) times daily. 07/12/17   Lavina Hamman, MD  guaiFENesin (MUCINEX) 600 MG 12 hr tablet Take 1,200 mg by mouth 2 (two) times daily.     [provider]    Ipratropium-Albuterol (COMBIVENT) 20-100 MCG/ACT AERS respimat Inhale 1 puff into the lungs every 6 (six) hours. 05/31/17   Aline August, MD  loratadine (CLARITIN) 10 MG tablet Take 10 mg by mouth daily.    [provider]  metoprolol tartrate (LOPRESSOR) 25 MG tablet Take 12.5 mg by mouth 2 (two) times daily.     [provider]  ondansetron (ZOFRAN-ODT) 4 MG disintegrating tablet Take 4 mg by mouth every 8 (eight) hours as needed for nausea or vomiting.    [provider]  Oxycodone HCl 10 MG TABS Take 1 tablet (10 mg total) by mouth every 4 (four) hours as needed for up to 15 doses (pain/shortness of breath). 07/13/17   McDonald, Mia A, PA-C  pantoprazole (PROTONIX) 40 MG tablet Take 1 tablet (40 mg total) by mouth 2 (two) times daily. 12/18/16   Eugenie Filler, MD  predniSONE (DELTASONE) 10 MG tablet Take 10 mg by mouth daily with breakfast.    [provider]  senna-docusate (SENOKOT-S) 8.6-50 MG tablet Take 2 tablets by mouth 2 (two) times daily. Patient taking differently: Take 1 tablet  by mouth 2 (two) times daily.  07/12/17   Lavina Hamman, MD  sucralfate (CARAFATE) 1 GM/10ML suspension Take 10 mLs (1 g total) by mouth 4 (four) times daily -  with meals and at bedtime. 07/12/17   Lavina Hamman, MD    Family History Family History  Problem Relation Age of Onset  . Cancer Father   . Diabetes Mellitus II Mother     Social History Social History   Tobacco Use  . Smoking status: Current Some Day Smoker    Packs/day: 0.10    Years: 30.00    Pack years: 3.00    Types: Cigarettes  . Smokeless tobacco: Never Used  . Tobacco comment: 1 cig a week  Substance Use Topics  . Alcohol use: No    Frequency: Never  . Drug use: No     Allergies   Patient has no known allergies.   Review of Systems Review of Systems  Constitutional: Negative for diaphoresis and fever.  HENT: Negative for rhinorrhea and sore throat.   Respiratory: Positive  for cough, shortness of breath and wheezing.   Cardiovascular: Negative for orthopnea, claudication and syncope.  Gastrointestinal: Negative for abdominal pain and anal bleeding.  Genitourinary: Negative for flank pain.  Skin: Negative for rash.  Neurological: Negative for syncope.  All other systems reviewed and are negative.    Physical Exam Updated Vital Signs BP 133/83 (BP Location: Left Arm)   Pulse 99   Temp 97.8 F (36.6 C) (Oral)   Resp (!) 26   Ht 5\' 11"  (1.803 m)   Wt 51.7 kg (113 lb 15.7 oz)   SpO2 99%   BMI 15.90 kg/m   Physical Exam  Constitutional: He is oriented to person, place, and time. He appears well-developed and well-nourished.  HENT:  Head: Normocephalic and atraumatic.  Mouth/Throat: No oropharyngeal exudate.  Eyes: Pupils are equal, round, and reactive to light. Conjunctivae are normal.  Neck: Normal range of motion. Neck supple.  Cardiovascular: Normal rate, regular rhythm, normal heart sounds and intact distal pulses.  Pulmonary/Chest: He has decreased breath sounds. He has wheezes.  Abdominal: Soft. Bowel sounds are normal. He exhibits no mass. There is no tenderness. There is no rebound and no guarding.  Musculoskeletal: Normal range of motion.  Neurological: He is alert and oriented to person, place, and time. He displays normal reflexes.  Skin: Skin is warm and dry. Capillary refill takes less than 2 seconds. He is not diaphoretic.     ED Treatments / Results  Labs (all labs ordered are listed, but only abnormal results are displayed) Results for orders placed or performed during the hospital encounter of 07/23/17  CBC with Differential/Platelet  Result Value Ref Range   WBC 4.4 4.0 - 10.5 K/uL   RBC 2.16 (L) 4.22 - 5.81 MIL/uL   Hemoglobin 7.0 (L) 13.0 - 17.0 g/dL   HCT 21.3 (L) 39.0 - 52.0 %   MCV 98.6 78.0 - 100.0 fL   MCH 32.4 26.0 - 34.0 pg   MCHC 32.9 30.0 - 36.0 g/dL   RDW 17.0 (H) 11.5 - 15.5 %   Platelets 123 (L) 150 - 400  K/uL   Neutrophils Relative % 91 %   Neutro Abs 4.0 1.7 - 7.7 K/uL   Lymphocytes Relative 6 %   Lymphs Abs 0.2 (L) 0.7 - 4.0 K/uL   Monocytes Relative 3 %   Monocytes Absolute 0.2 0.1 - 1.0 K/uL   Eosinophils Relative 0 %  Eosinophils Absolute 0.0 0.0 - 0.7 K/uL   Basophils Relative 0 %   Basophils Absolute 0.0 0.0 - 0.1 K/uL  Brain natriuretic peptide  Result Value Ref Range   B Natriuretic Peptide 49.6 0.0 - 100.0 pg/mL   Ct Abdomen Pelvis Wo Contrast  Result Date: 07/03/2017 CLINICAL DATA:  Abdominal distension, worsening shortness of breath, lung cancer, pulmonary embolism, COPD, CHF EXAM: CT ABDOMEN AND PELVIS WITHOUT CONTRAST TECHNIQUE: Multidetector CT imaging of the abdomen and pelvis was performed following the standard protocol without IV contrast. Sagittal and coronal MPR images reconstructed from axial data set. Patient drank dilute oral contrast for exam COMPARISON:  12/16/2016 FINDINGS: Lower chest: Moderate RIGHT pleural effusion. Peribronchial thickening, more pronounced in LEFT lower lobe with question mucous plugging emphysematous changes at LEFT base. Compressive atelectasis RIGHT lower lobe. Minimal LEFT pleural fluid. Hepatobiliary: Gallbladder and liver unremarkable Pancreas: Normal appearance Spleen: Normal appearance Adrenals/Urinary Tract: Adrenal glands, kidneys, ureters, and bladder normal appearance Stomach/Bowel: Visualization of proximal appendix in RIGHT pelvis, grossly unremarkable. Stool throughout colon. Large and small bowel loops normal appearance. Distended stomach with wall thickening of antrum and pylorus extending in the duodenal bulb could reflect gastritis. Vascular/Lymphatic: Atherosclerotic calcifications aorta and iliac arteries. Tip of central venous catheter at cavoatrial junction. No adenopathy. Reproductive: Minimal prostatic enlargement. Other: Low-attenuation free fluid in pelvis and minimally at scattered in upper abdomen. No free air. No hernia.  Musculoskeletal: Sclerotic T12 vertebral body again identified, question due to osseous metastatic disease. Additional sclerotic focus at the inferior L2 vertebral body. Lytic lesion with sclerotic rim at inferior L3 unchanged. Metallic foreign body at inferior medial LEFT buttock. IMPRESSION: RIGHT pleural effusion and basilar atelectasis. Minimal LEFT pleural effusion and scattered ascites greatest in pelvis. Wall thickening of distal gastric antrum pylorus, could reflect gastritis. Suspected sclerotic metastatic lesions of T12 and L2 with an additional chronic well-circumscribed lytic focus at inferior L3. Electronically Signed   By: Lavonia Dana M.D.   On: 07/03/2017 13:02   Dg Chest 1 View  Result Date: 07/11/2017 CLINICAL DATA:  Status post thoracentesis on the right EXAM: CHEST  1 VIEW COMPARISON:  07/11/2017 FINDINGS: Cardiac shadows within normal limits. Right chest wall port is again seen and stable. Interval right thoracentesis has been performed. A minimal basilar pneumothorax is noted. This is likely related incomplete re-expansion of the right lower lobe. Correlation with patient's symptomatology is recommended. No other focal abnormality is seen. Stable left pleural effusion is noted. IMPRESSION: Small right basilar pneumothorax. This is likely related incomplete re-expansion of the lower lobe. Correlate with patient's symptomatology. Electronically Signed   By: Inez Catalina M.D.   On: 07/11/2017 13:47   Dg Chest 1 View  Result Date: 07/05/2017 CLINICAL DATA:  S/p right sided thoracentesis today, 1.2 liters removed. EXAM: CHEST  1 VIEW COMPARISON:  Chest x-ray dated 07/04/2017. FINDINGS: Improved aeration at the RIGHT lung base, significantly decreased amount of pleural effusion compared to the earlier CT of 06/30/2017. Stable small LEFT pleural effusion and/or atelectasis. No pneumothorax seen. Heart size and mediastinal contours are stable. RIGHT chest wall Port-A-Cath is stable in position  with tip overlying the RIGHT atrium. No acute or suspicious osseous finding. IMPRESSION: Status post thoracentesis.  No pneumothorax. Electronically Signed   By: Franki Cabot M.D.   On: 07/05/2017 14:06   Dg Chest 2 View  Result Date: 07/20/2017 CLINICAL DATA:  Shortness of breath. EXAM: CHEST - 2 VIEW COMPARISON:  Chest x-ray from yesterday. FINDINGS: Unchanged right chest  wall port catheter. The heart size and mediastinal contours are within normal limits. Normal pulmonary vascularity. The lungs remain hyperinflated with emphysematous changes. Increased interstitial markings in the right lung are similar to prior study. Unchanged small right pleural effusion. Unchanged left upper lobe spiculated mass. No acute osseous abnormality. IMPRESSION: 1. COPD with unchanged left upper lobe spiculated mass and small right pleural effusion. Electronically Signed   By: Titus Dubin M.D.   On: 07/20/2017 23:28   Dg Chest 2 View  Result Date: 07/18/2017 CLINICAL DATA:  Lung cancer. EXAM: CHEST - 2 VIEW COMPARISON:  Chest x-ray 07/16/2017, 05/28/2017, 03/17/2017. CT 03/13/2017. FINDINGS: PowerPort catheter noted with tip over right atrium. Cardiomegaly with normal pulmonary vascularity. Hyperexpansion of both lung fields noted consistent with COPD. Mild mid lung field basilar subsegmental atelectasis. Unchanged bilateral basilar pleural thickening consistent with persistent effusions and or scarring. IMPRESSION: 1.  PowerPort catheter noted in stable position. 2. COPD. Mild subsegmental atelectasis noted the mid lung fields and both bases. Mild bilateral basilar pleural thickening consistent with persistent effusions and/or scarring. No acute abnormality identified. 3.  Stable cardiomegaly.  No pulmonary venous congestion. Electronically Signed   By: Marcello Moores  Register   On: 07/18/2017 14:02   Dg Chest 2 View  Result Date: 07/16/2017 CLINICAL DATA:  Weakness, dyspnea EXAM: CHEST - 2 VIEW COMPARISON:  07/13/2017  chest radiograph. FINDINGS: Right subclavian MediPort terminates at the cavoatrial junction. Stable cardiomediastinal silhouette with normal heart size. No pneumothorax. Stable blunting of the right greater than left costophrenic angles. No pulmonary edema. Severe emphysema, asymmetrically prominent in the left lung, with lung hyperinflation. No acute consolidative airspace disease. Stable mild scarring at the lung bases. IMPRESSION: 1. No acute cardiopulmonary disease. 2. Lung hyperinflation and severe emphysema asymmetrically involving the left lung, compatible with COPD. 3. Stable blunting of the right greater than left costophrenic angles, which could represent small pleural effusions and/or pleural-parenchymal scarring. Electronically Signed   By: Ilona Sorrel M.D.   On: 07/16/2017 16:28   Dg Chest 2 View  Result Date: 07/13/2017 CLINICAL DATA:  57 year old male with dyspnea and chest pain. EXAM: CHEST - 2 VIEW COMPARISON:  Chest radiograph dated 07/12/2017 FINDINGS: Right-sided Port-A-Cath with tip in stable position. There is severe emphysematous changes of the lungs. Faint nodular density in the left upper lobe better seen on the prior CT. There is no pneumothorax. Small stable bilateral pleural effusions, right greater left. The cardiac silhouette is within normal limits. No acute osseous pathology. IMPRESSION: 1. No acute cardiopulmonary process. 2. Severe emphysema and stable bilateral pleural effusions with Electronically Signed   By: Anner Crete M.D.   On: 07/13/2017 21:53   Dg Chest 2 View  Result Date: 07/11/2017 CLINICAL DATA:  Shortness of breath.  History of lung cancer. EXAM: CHEST - 2 VIEW COMPARISON:  Chest 07/05/2017, CT chest 06/30/2017 FINDINGS: Heart size and pulmonary vascularity are normal. Focal scarring in the left suprahilar and left upper lung region corresponding to lesion better seen at previous CT chest. Small bilateral pleural effusions demonstrating mild increased  since previous study. There is developing right perihilar and basilar infiltration which could represent asymmetrical edema or pneumonia. No pneumothorax. Power port type central venous catheter with tip over the cavoatrial junction region. IMPRESSION: Small but increasing bilateral pleural effusions. Increasing infiltration or edema in the right perihilar and basilar lung regions. Focal scarring in the left upper lung is identified, corresponding to changes better seen on previous CT scan. Electronically Signed   By:  Lucienne Capers M.D.   On: 07/11/2017 00:30   Dg Chest 2 View  Result Date: 07/04/2017 CLINICAL DATA:  Shortness of breath EXAM: CHEST - 2 VIEW COMPARISON:  July 04, 2017 FINDINGS: A right Port-A-Cath is in good position. Bilateral pleural effusions are stable. Stable left upper lobe mass, unchanged. Increased lucency in the left lung compared to the right is stable. Increased haziness on the right is stable compared to the left. No change in the cardiomediastinal silhouette. IMPRESSION: No interval change in bilateral pleural effusions, a left upper lobe mass, the right Port-A-Cath, or haziness in the right chest. Electronically Signed   By: Dorise Bullion III M.D   On: 07/04/2017 22:33   Dg Chest 2 View  Result Date: 07/04/2017 CLINICAL DATA:  Worsening shortness of Breath EXAM: CHEST - 2 VIEW COMPARISON:  06/30/2017 FINDINGS: Cardiac shadow is stable. Right chest wall port is again seen. Right pleural effusion is seen and stable from the previous CT examination. Stable left upper lobe mass lesion is noted. No acute bony abnormality is noted. IMPRESSION: Stable left upper lobe mass. Right-sided pleural effusion similar to that seen on recent CT examination. Electronically Signed   By: Inez Catalina M.D.   On: 07/04/2017 08:38   Dg Chest 2 View  Result Date: 06/30/2017 CLINICAL DATA:  Initial evaluation for acute shortness of breath. History of lung cancer. EXAM: CHEST - 2 VIEW  COMPARISON:  Prior radiograph from 06/25/2017. FINDINGS: Right-sided Port-A-Cath in place with tip overlying the proximal right atrium. Cardiac and mediastinal silhouettes stable in size and contour, and remain within normal limits. Lungs normally inflated. Small moderate right pleural effusion is increased from previous. Scattered vascular congestion with crowding throughout the right lung, progressed from previous. Probable superimposed right basilar atelectasis. Blunting of the left costophrenic angle, which could reflect effusion and/or chronic pleural reaction/scarring, similar to previous. Left lung otherwise largely clear. Left perihilar architectural distortion with possible spiculated nodule, similar to previous. No pneumothorax. No acute osseous abnormality. IMPRESSION: 1. Small to moderate layering right pleural effusion, increased from previous, with worsened vascular congestion/crowding and probable atelectatic changes within the right lung. 2. Blunting of the left costophrenic angle, which could reflect small effusion and/or chronic pleural reaction/scarring, similar to previous. 3. Left perihilar architectural distortion with underlying spiculated nodular density, consistent with known lung malignancy. Electronically Signed   By: Jeannine Boga M.D.   On: 06/30/2017 04:36   Dg Chest 2 View  Result Date: 06/25/2017 CLINICAL DATA:  Shortness of breath.  History of lung carcinoma EXAM: CHEST - 2 VIEW COMPARISON:  June 24, 2017 chest radiograph and chest CT April 13, 2017 FINDINGS: Port-A-Cath tip is in superior vena cava. No pneumothorax. There are small pleural effusions bilaterally with bibasilar scarring. The irregular nodular opacity in the left upper lobe medially is stable, measuring 2.2 x 2.1 cm. No new opacity is evident. Heart size and pulmonary vascularity are normal. No adenopathy is appreciable. No bone lesions. IMPRESSION: Spiculated nodular lesion left upper lobe medially.  Neoplastic focus must be of concern given this appearance. Small pleural effusions with mild bibasilar scarring noted. Stable cardiac silhouette. No evident pneumothorax. Electronically Signed   By: Lowella Grip III M.D.   On: 06/25/2017 07:05   Ct Angio Chest Pe W And/or Wo Contrast  Addendum Date: 06/30/2017   ADDENDUM REPORT: 06/30/2017 08:21 ADDENDUM: Critical Value/emergent results were called by telephone at the time of interpretation on 06/30/2017 at 8:21 am to [place] in the  ED who verbally acknowledged these results. Electronically Signed   By: Genevie Ann M.D.   On: 06/30/2017 08:21   Result Date: 06/30/2017 CLINICAL DATA:  57 year old male with stage IV lung cancer and severe shortness of breath. EXAM: CT ANGIOGRAPHY CHEST WITH CONTRAST TECHNIQUE: Multidetector CT imaging of the chest was performed using the standard protocol during bolus administration of intravenous contrast. Multiplanar CT image reconstructions and MIPs were obtained to evaluate the vascular anatomy. CONTRAST:  147mL ISOVUE-370 IOPAMIDOL (ISOVUE-370) INJECTION 76% COMPARISON:  Chest CT 04/13/2017, chest CTA 03/13/2017. FINDINGS: Cardiovascular: Good contrast bolus timing in the pulmonary arterial tree. There is no right lung pulmonary artery filling defect. 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 However, this is inseparable from abnormal peribronchial soft tissue tracking to the left hilum from the spiculated left upper lobe tumor. The vessel thrombus is primarily nonocclusive, although distal obliteration of the left upper lobe artery occurs but is stable since 03/13/2017. 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  Additionally, the right chest Port-A-Cath was injected. It is unclear whether there could be thrombus within the SVC and right atrium (series 5, image 184). This could be mixing artifact. Stable cardiac size. No pericardial effusion. Stable visible aorta. Mediastinum/Nodes: Loss of normal mediastinal fat planes, but no discrete mediastinal mass identified. Lungs/Pleura: Continued moderate size layering right pleural effusion, stable since March. Simple fluid density. Trace layering left pleural fluid. Stable major airway patency. Mildly regressed size and nodularity of the left upper lobe spiculated mass since March and 03/13/2017 (compare series 6, image 37 today to series 6, image 45 in February). However, contiguous opacity from the mass to the left hilum persists. Underlying emphysema, severe on the left.  No new pulmonary opacity. Upper Abdomen: Small volume perihepatic free fluid along the periphery of the visible right hepatic lobe is new, but gastrohepatic ligament fluid as regressed and appears virtually resolved since 04/13/2017. Liver parenchyma, visible spleen, adrenal glands, kidneys, pancreas, and bowel appear within normal limits. Musculoskeletal: Stable visualized osseous structures. Heterogeneous sclerosis of T12 remain stable since February. Review of the MIP images confirms the above findings. IMPRESSION: 1. Positive for evidence of acute pulmonary thrombus at the left main pulmonary artery bifurcation and extending into the left upper lobe. Much of the visible thrombus is nonocclusive, and it might be tumor related, with adjacent peribronchial opacity tracking from the left upper lobe carcinoma to the hilum. 2. 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 No other pulmonary thrombus identified. 3. Mildly regressed size of the spiculated left upper lung mass since March. 4. Continued moderate size layering right pleural effusion. Underlying emphysema. 5.  Small volume perihepatic ascites is new since March but the previously-seen gastrohepatic ligament fluid has regressed. Electronically Signed: By: Genevie Ann M.D. On: 06/30/2017 08:13   Ct Abdomen Pelvis W Contrast  Result Date: 07/16/2017 CLINICAL DATA:  Shortness of breath and dizziness. Diarrhea and nausea. EXAM: CT ABDOMEN AND PELVIS WITH CONTRAST TECHNIQUE: Multidetector CT imaging of the abdomen and pelvis was performed using the standard protocol following bolus administration of intravenous contrast. CONTRAST:  72mL ISOVUE-300 IOPAMIDOL (ISOVUE-300) INJECTION 61% COMPARISON:  CT abdomen pelvis 07/03/2017 FINDINGS: LOWER CHEST: Small pleural effusions, right  greater than left. Bibasilar atelectasis. HEPATOBILIARY: Focal fat deposition along the course of the left portal vein. No focal liver lesion otherwise. No biliary dilatation. Gallbladder is partially contracted. PANCREAS: Normal parenchymal contours without ductal dilatation. No peripancreatic fluid collection. SPLEEN: Normal. ADRENALS/URINARY TRACT: --Adrenal glands: Normal. --Right kidney/ureter: No hydronephrosis, nephroureterolithiasis, perinephric stranding or solid renal mass. --Left kidney/ureter: No hydronephrosis, nephroureterolithiasis, perinephric stranding or solid renal mass. --Urinary bladder: Normal for degree of distention STOMACH/BOWEL: --Stomach/Duodenum: Persistent esophageal wall thickening near the gastroesophageal junction. --Small bowel: No dilatation or inflammation. --Colon: No focal abnormality. --Appendix: Not visualized. No right lower quadrant inflammation or free fluid. VASCULAR/LYMPHATIC: Atherosclerotic calcification is present within the non-aneurysmal abdominal aorta, without hemodynamically significant stenosis. The portal vein, splenic vein, superior mesenteric vein and IVC are patent. No abdominal or pelvic lymphadenopathy. REPRODUCTIVE: Normal prostate and seminal vesicles. MUSCULOSKELETAL. Unchanged lucent lesion  of the left L3 vertebral body. T12 vertebral body remains sclerotic. OTHER: None. IMPRESSION: 1. No acute abnormality of the abdomen or pelvis. 2. Small pleural effusions, right larger than left but the right effusion is decreased in size from 07/03/2017. 3.  Aortic Atherosclerosis (ICD10-I70.0). 4. Unchanged sclerotic appearance of T12 and lucent lesion of the inferior endplate of L3. 5. Persistent wall thickening at the gastroesophageal junction. Electronically Signed   By: Ulyses Jarred M.D.   On: 07/16/2017 17:32   US Abdomen Limited  Result Date: 07/02/2017 CLINICAL DATA:  Evaluate for ascites. EXAM: ULTRASOUND ABDOMEN LIMITED COMPARISON:  None. FINDINGS: Large right pleural effusion.  Small ascites in the abdomen. IMPRESSION: Large right pleural effusion.  Small ascites in the abdomen. Electronically Signed   By: Dorise Bullion III M.D   On: 07/02/2017 18:07   Dg Chest Portable 1 View  Result Date: 07/23/2017 CLINICAL DATA:  57 y/o M; shortness of breath and history of lung cancer. EXAM: PORTABLE CHEST 1 VIEW COMPARISON:  07/20/2017 chest radiograph. FINDINGS: Stable right port catheter with tip projecting over lower SVC. Stable heart size. Stable left upper lobe spiculated nodule. Stable small to moderate right pleural effusion. No new focal consolidation. No pneumothorax. Bones are unremarkable. Stable findings of COPD. IMPRESSION: Stable COPD, small to moderate right pleural effusion, and left upper lobe spiculated nodule. Electronically Signed   By: Kristine Garbe M.D.   On: 07/23/2017 23:41   Dg Chest Port 1 View  Result Date: 07/19/2017 CLINICAL DATA:  Shortness of breath for 2 days EXAM: PORTABLE CHEST 1 VIEW COMPARISON:  07/18/2017 FINDINGS: Right-sided chest port is again noted in satisfactory position. Lungs are hyperinflated bilaterally consistent with COPD. Chronic blunting of the costophrenic angles is again seen. Slight increased interstitial markings are noted in the right  lung similar to that seen on the prior exam. No pneumothorax is seen. No acute bony abnormality is noted. IMPRESSION: COPD and chronic changes similar to that seen on the previous day. Electronically Signed   By: Inez Catalina M.D.   On: 07/19/2017 15:17   Dg Chest Port 1 View  Result Date: 07/12/2017 CLINICAL DATA:  Pneumothorax on right for follow-up. Thoracentesis 07/11/2017. lung cancer. EXAM: PORTABLE CHEST 1 VIEW COMPARISON:  07/11/2016 FINDINGS: Right IJ Port-A-Cath unchanged. Lungs are adequately inflated and demonstrate emphysematous disease. Small amount left pleural fluid unchanged. Slight worsening of a small amount right pleural fluid. Stable small right basilar pneumothorax. Stable mild prominence of the right perihilar markings. Stable nodule opacity over the left upper lung previously evaluated by CT compatible with known lung cancer. Cardiomediastinal silhouette and remainder the exam  is unchanged. IMPRESSION: Stable small right basilar pneumothorax. Small amount right pleural fluid slightly worse. Stable small left pleural effusion. Mild stable prominence of the right perihilar markings. Stable nodular opacity over the left upper lung representing known lung cancer. Electronically Signed   By: Marin Olp M.D.   On: 07/12/2017 07:25   Dg Abd Portable 1v  Result Date: 07/11/2017 CLINICAL DATA:  Abdominal pain.  Nausea vomiting.  Constipation. EXAM: PORTABLE ABDOMEN - 1 VIEW COMPARISON:  CT 07/03/2017. FINDINGS: Soft tissue structures are unremarkable. No bowel distention. Stool noted throughout the colon. Metallic fragment noted over the pelvis. Pelvic calcifications consistent phleboliths. No acute bony abnormality. IMPRESSION: 1.  Metallic fragment noted over the pelvis. 2. No acute abnormality otherwise noted. No bowel distention. Stool noted throughout the colon. Electronically Signed   By: Marcello Moores  Register   On: 07/11/2017 10:23   US Thoracentesis Asp Pleural Space W/img  Guide  Result Date: 07/11/2017 INDICATION: Patient with history of metastatic lung cancer, dyspnea, recurrent right pleural effusion. Request made for diagnostic and therapeutic right thoracentesis. EXAM: ULTRASOUND GUIDED DIAGNOSTIC AND THERAPEUTIC RIGHT THORACENTESIS MEDICATIONS: None COMPLICATIONS: None immediate. PROCEDURE: An ultrasound guided thoracentesis was thoroughly discussed with the patient and questions answered. The benefits, risks, alternatives and complications were also discussed. The patient understands and wishes to proceed with the procedure. Written consent was obtained. Ultrasound was performed to localize and mark an adequate pocket of fluid in the right chest. The area was then prepped and draped in the normal sterile fashion. 1% Lidocaine was used for local anesthesia. Under ultrasound guidance a 6 Fr Safe-T-Centesis catheter was introduced. Thoracentesis was performed. The catheter was removed and a dressing applied. FINDINGS: A total of approximately 680 cc of turbid, bloody fluid was removed. Samples were sent to the laboratory as requested by the clinical team. IMPRESSION: Successful ultrasound guided diagnostic and therapeutic right thoracentesis yielding 680 cc of pleural fluid. Patient was noted to have small right basilar pneumothorax on follow-up chest x-ray, questionable ex vacuo origin. Patient currently stable. We will repeat chest x-ray in a.m. to ensure stability. Read by: Rowe Robert, PA-C Electronically Signed   By: Lucrezia Europe M.D.   On: 07/11/2017 14:44   US Thoracentesis Asp Pleural Space W/img Guide  Result Date: 07/05/2017 INDICATION: Patient with history of metastatic lung cancer, dyspnea, and recurrent right-sided pleural effusion. Request is made for diagnostic and therapeutic right thoracentesis. EXAM: ULTRASOUND GUIDED DIAGNOSTIC AND THERAPEUTIC RIGHT THORACENTESIS MEDICATIONS: 10 mL of 1% lidocaine COMPLICATIONS: None immediate. PROCEDURE: An ultrasound  guided thoracentesis was thoroughly discussed with the patient and questions answered. The benefits, risks, alternatives and complications were also discussed. The patient understands and wishes to proceed with the procedure. Written consent was obtained. Ultrasound was performed to localize and mark an adequate pocket of fluid in the right chest. The area was then prepped and draped in the normal sterile fashion. 1% Lidocaine was used for local anesthesia. Under ultrasound guidance a 6 Fr Safe-T-Centesis catheter was introduced. Thoracentesis was performed. The catheter was removed and a dressing applied. FINDINGS: A total of approximately 1.2 L of blood-tinged fluid was removed. Samples were sent to the laboratory as requested by the clinical team. IMPRESSION: Successful ultrasound guided right thoracentesis yielding 1.2 L of pleural fluid. Read by: Earley Abide, PA-C No pneumothorax on follow-up radiograph. Electronically Signed   By: Lucrezia Europe M.D.   On: 07/05/2017 14:12    EKG EKG Interpretation  Date/Time:  Wednesday July 23 2017 23:10:06 EDT  Ventricular Rate:  111 PR Interval:    QRS Duration: 85 QT Interval:  349 QTC Calculation: 475 R Axis:   99 Text Interpretation:  Sinus tachycardia Confirmed by Dory Horn) on 07/23/2017 11:39:27 PM   Radiology Dg Chest Portable 1 View  Result Date: 07/23/2017 CLINICAL DATA:  57 y/o M; shortness of breath and history of lung cancer. EXAM: PORTABLE CHEST 1 VIEW COMPARISON:  07/20/2017 chest radiograph. FINDINGS: Stable right port catheter with tip projecting over lower SVC. Stable heart size. Stable left upper lobe spiculated nodule. Stable small to moderate right pleural effusion. No new focal consolidation. No pneumothorax. Bones are unremarkable. Stable findings of COPD. IMPRESSION: Stable COPD, small to moderate right pleural effusion, and left upper lobe spiculated nodule. Electronically Signed   By: Kristine Garbe M.D.   On:  07/23/2017 23:41    Procedures Procedures (including critical care time)  Medications Ordered in ED Medications  methylPREDNISolone sodium succinate (SOLU-MEDROL) 125 mg/2 mL injection 60 mg (has no administration in time range)  acetaminophen (TYLENOL) tablet 650 mg (has no administration in time range)  apixaban (ELIQUIS) tablet 5 mg (has no administration in time range)  escitalopram (LEXAPRO) tablet 5 mg (5 mg Oral Given 07/24/17 0333)  feeding supplement (ENSURE ENLIVE) (ENSURE ENLIVE) liquid 237 mL (has no administration in time range)  ferrous sulfate tablet 325 mg (has no administration in time range)  folic acid (FOLVITE) tablet 1 mg (has no administration in time range)  furosemide (LASIX) tablet 40 mg (has no administration in time range)  gabapentin (NEURONTIN) capsule 100 mg (has no administration in time range)  guaiFENesin (MUCINEX) 12 hr tablet 600 mg (600 mg Oral Given 07/24/17 0333)  loratadine (CLARITIN) tablet 10 mg (has no administration in time range)  metoprolol tartrate (LOPRESSOR) tablet 12.5 mg (12.5 mg Oral Given 07/24/17 0333)  oxyCODONE (Oxy IR/ROXICODONE) immediate release tablet 10 mg (10 mg Oral Given 07/24/17 0335)  pantoprazole (PROTONIX) EC tablet 40 mg (40 mg Oral Given 07/24/17 0334)  senna-docusate (Senokot-S) tablet 1 tablet (1 tablet Oral Given 07/24/17 0334)  sucralfate (CARAFATE) 1 GM/10ML suspension 1 g (has no administration in time range)  0.9 %  sodium chloride infusion (Manually program via Guardrails IV Fluids) (has no administration in time range)  nicotine (NICODERM CQ - dosed in mg/24 hours) patch 21 mg (has no administration in time range)  azithromycin (ZITHROMAX) tablet 500 mg (500 mg Oral Given 07/24/17 0336)    Followed by  azithromycin (ZITHROMAX) tablet 250 mg (has no administration in time range)  ipratropium (ATROVENT) nebulizer solution 0.5 mg (has no administration in time range)  levalbuterol (XOPENEX) nebulizer solution 1.25 mg (has no  administration in time range)  umeclidinium-vilanterol (ANORO ELLIPTA) 62.5-25 MCG/INH 1 puff (has no administration in time range)  budesonide (PULMICORT) nebulizer solution 0.25 mg (has no administration in time range)  sodium chloride flush (NS) 0.9 % injection 10-40 mL (has no administration in time range)  albuterol (PROVENTIL) (2.5 MG/3ML) 0.083% nebulizer solution 5 mg (5 mg Nebulization Given 07/23/17 2343)  ipratropium (ATROVENT) nebulizer solution 0.5 mg (0.5 mg Nebulization Given 07/23/17 2343)  methylPREDNISolone sodium succinate (SOLU-MEDROL) 125 mg/2 mL injection 125 mg (125 mg Intravenous Given 07/24/17 0014)  albuterol (PROVENTIL) (2.5 MG/3ML) 0.083% nebulizer solution 5 mg (5 mg Nebulization Given 07/24/17 0114)  furosemide (LASIX) injection 20 mg (20 mg Intravenous Given 07/24/17 0336)       Final Clinical Impressions(s) / ED Diagnoses   Will admit for COPD exacerbation and  symptomatic anemia,     Keagan Brislin, MD 07/24/17 [phone]

## 2017-07-24 NOTE — Progress Notes (Signed)
Patient bed alarm going off when RN got to bedside patient standing and using the urinal yelling "turn this thing off". Informed patient he will need to call out when getting out of bed. Patient stated "I am not going to call out I said turn that bed alarm off". RN educated patient on why bed alarm is on and why it needs to stay on.

## 2017-07-25 DIAGNOSIS — C3492 Malignant neoplasm of unspecified part of left bronchus or lung: Secondary | ICD-10-CM | POA: Diagnosis not present

## 2017-07-25 DIAGNOSIS — I5032 Chronic diastolic (congestive) heart failure: Secondary | ICD-10-CM | POA: Diagnosis not present

## 2017-07-25 DIAGNOSIS — N183 Chronic kidney disease, stage 3 (moderate): Secondary | ICD-10-CM | POA: Diagnosis not present

## 2017-07-25 DIAGNOSIS — J9621 Acute and chronic respiratory failure with hypoxia: Secondary | ICD-10-CM | POA: Diagnosis not present

## 2017-07-25 LAB — BPAM RBC
BLOOD PRODUCT EXPIRATION DATE: 201908062359
ISSUE DATE / TIME: 201907040626
Unit Type and Rh: 5100

## 2017-07-25 LAB — CBC
HCT: 23.1 % — ABNORMAL LOW (ref 39.0–52.0)
Hemoglobin: 7.9 g/dL — ABNORMAL LOW (ref 13.0–17.0)
MCH: 33.2 pg (ref 26.0–34.0)
MCHC: 34.2 g/dL (ref 30.0–36.0)
MCV: 97.1 fL (ref 78.0–100.0)
PLATELETS: 89 10*3/uL — AB (ref 150–400)
RBC: 2.38 MIL/uL — AB (ref 4.22–5.81)
RDW: 17 % — ABNORMAL HIGH (ref 11.5–15.5)
WBC: 5.6 10*3/uL (ref 4.0–10.5)

## 2017-07-25 LAB — BASIC METABOLIC PANEL
ANION GAP: 8 (ref 5–15)
BUN: 28 mg/dL — ABNORMAL HIGH (ref 6–20)
CALCIUM: 9.2 mg/dL (ref 8.9–10.3)
CO2: 36 mmol/L — ABNORMAL HIGH (ref 22–32)
CREATININE: 1.75 mg/dL — AB (ref 0.61–1.24)
Chloride: 96 mmol/L — ABNORMAL LOW (ref 98–111)
GFR, EST AFRICAN AMERICAN: 48 mL/min — AB (ref >60)
GFR, EST NON AFRICAN AMERICAN: 41 mL/min — AB (ref >60)
GLUCOSE: 139 mg/dL — AB (ref 70–99)
Potassium: 3.8 mmol/L (ref 3.5–5.1)
Sodium: 140 mmol/L (ref 135–145)

## 2017-07-25 LAB — TYPE AND SCREEN
ABO/RH(D): O POS
ANTIBODY SCREEN: NEGATIVE
Unit division: 0

## 2017-07-25 LAB — HIV ANTIBODY (ROUTINE TESTING W REFLEX): HIV SCREEN 4TH GENERATION: NONREACTIVE

## 2017-07-25 MED ORDER — PREDNISONE 10 MG PO TABS: 10.0000 mg | ORAL_TABLET | Freq: Every day | ORAL | Status: DC

## 2017-07-25 MED ORDER — HYDROXYZINE HCL 10 MG PO TABS: 10.0000 mg | ORAL_TABLET | Freq: Three times a day (TID) | ORAL | 0 refills | Status: DC | PRN

## 2017-07-25 MED ORDER — HEPARIN SOD (PORK) LOCK FLUSH 100 UNIT/ML IV SOLN
500.0000 [IU] | INTRAVENOUS | Status: AC | PRN
  Administered 2017-07-25: 500 [IU]

## 2017-07-25 MED ORDER — AZITHROMYCIN 250 MG PO TABS: ORAL_TABLET | ORAL | 0 refills | Status: DC

## 2017-07-25 NOTE — Progress Notes (Signed)
Patient returning to [name]. [facility] aware of patient's discharge and confirmed patient's ability to return. CSW faxed clinical documents to ALF. [facility] contacted, patient aware and declined for CSW to notify family. Patient's RN can call report [phone], packet complete. CSW signing off, no other needs identified at this time.   Abundio Miu, [place] Social Worker [facility] Cell#: [phone]

## 2017-07-25 NOTE — NC FL2 (Addendum)
[place] LEVEL OF CARE SCREENING TOOL     IDENTIFICATION  Patient Name: [name] Birthdate: Sep 10, 1960 Sex: male Admission Date (Current Location): 07/23/2017  [facility] and [place] Number:  Herbalist and Address:  [facility],  [place], [place]      Provider Number: [id number]  Attending Physician Name and Address:  Donne Hazel, MD  Relative Name and Phone Number:       Current Level of Care: Hospital Recommended Level of Care: [place] Prior Approval Number:    Date Approved/Denied:   PASRR Number:    Discharge Plan: Other (Comment)(Assisted living facility)    Current Diagnoses: Patient Active Problem List   Diagnosis Date Noted  . Anemia 07/24/2017  . Normocytic anemia 07/24/2017  . Chronic diastolic (congestive) heart failure ([place]) 07/24/2017  . Recurrent pleural effusion on right 07/15/2017  . Acute on chronic respiratory failure ([place]) 07/11/2017  . History of pulmonary embolism 07/11/2017  . PE (pulmonary thromboembolism) ([place]) 06/30/2017  . Pulmonary embolism ([place]) 06/30/2017  . AKI (acute kidney injury) ([place]) 06/25/2017  . Dyspnea 06/21/2017  . Chronic combined systolic (congestive) and diastolic (congestive) heart failure ([place])   . Constipation 06/07/2017  . Pain   . Acute on chronic respiratory failure with hypoxia ([place]) 05/28/2017  . Chronic respiratory failure with hypoxia ([place]) 04/01/2017  . Leucocytosis 03/16/2017  . CKD (chronic kidney disease), stage III ([place]) 03/12/2017  . Malnutrition of moderate degree 03/10/2017  . COPD exacerbation ([place]) 03/09/2017  . Macrocytic anemia 02/07/2017  . Severe malnutrition ([place]) 01/03/2017  . DOE (dyspnea on exertion) 12/30/2016  . Nausea   . Hypervolemia   . HCAP (healthcare-associated pneumonia) 12/19/2016  . SOB (shortness of breath) 12/19/2016  . Erosive gastropathy 12/18/2016  . Gastritis 12/18/2016  . Hematemesis  12/16/2016  . Intractable nausea and vomiting 12/16/2016  . HTN (hypertension) 10/30/2016  . Chronic obstructive pulmonary disease ([place]) 09/02/2016  . COPD (chronic obstructive pulmonary disease) ([place]) 08/19/2016  . Dehydration 06/04/2016  . Abdominal pain 06/04/2016  . 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 Adenocarcinoma of left lung, stage 4 ([place]) 05/02/2016  . Goals of care, counseling/discussion 05/02/2016  . Encounter for antineoplastic chemotherapy 05/02/2016  . Tobacco abuse 05/30/2011  . Alcohol abuse 05/30/2011  . Seizure ([place]) 05/28/2011    Orientation RESPIRATION BLADDER Height & Weight     Self, Time, Situation, Place  O2 Continent Weight: 113 lb 15.7 oz (51.7 kg) Height:  5\' 11"  (180.3 cm)  BEHAVIORAL SYMPTOMS/MOOD NEUROLOGICAL BOWEL NUTRITION STATUS      Continent Diet(regular)  AMBULATORY STATUS COMMUNICATION OF NEEDS Skin   Supervision Verbally Normal                       Personal Care Assistance Level of Assistance  Bathing, Feeding, Dressing Bathing Assistance: Limited assistance Feeding assistance: Independent Dressing Assistance: Limited assistance     Functional Limitations Info  Sight, Hearing, Speech Sight Info: Adequate Hearing Info: Adequate Speech Info: Adequate    SPECIAL CARE FACTORS FREQUENCY   Physical Therapy  Home Health Physical Therapy                  Contractures Contractures Info: Not present    Additional Factors Info  Code Status, Allergies Code Status Info: DNR Allergies Info: NKA              Discharge Medications: Medication List    TAKE these  medications   acetaminophen 500 MG tablet Commonly known as:  TYLENOL Take 1 tablet (500 mg total) by mouth 2 (two) times daily as needed for mild pain.   albuterol (2.5 MG/3ML) 0.083% nebulizer solution Commonly known as:  PROVENTIL 1 neb every 4-6 hours as needed for wheezing and shortness of breath   ALKA-SELTZER PLUS COLD PO Take 2 tablets by  mouth at bedtime as needed (COUGH).   apixaban 5 MG Tabs tablet Commonly known as:  ELIQUIS Take 1 tablet (5 mg total) by mouth 2 (two) times daily. What changed:  how much to take   azithromycin 250 MG tablet Commonly known as:  ZITHROMAX 1 tab po daily x 4 more days, zero refills   dexamethasone 4 MG tablet Commonly known as:  DECADRON Take 4 mg by mouth 2 (two) times daily. The day before, the day of, and the day after chemotherapy every three weeks   escitalopram 5 MG tablet Commonly known as:  LEXAPRO Take 1 tablet (5 mg total) by mouth at bedtime.   feeding supplement (ENSURE ENLIVE) Liqd Take 237 mLs by mouth 3 (three) times daily between meals.   ferrous sulfate 325 (65 FE) MG tablet Take 1 tablet (325 mg total) by mouth 2 (two) times daily with a meal.   Fluticasone-Umeclidin-Vilant 100-62.5-25 MCG/INH Aepb Commonly known as:  TRELEGY ELLIPTA Inhale 1 puff into the lungs daily.   folic acid 1 MG tablet Commonly known as:  FOLVITE Take 1 tablet (1 mg total) by mouth daily.   furosemide 40 MG tablet Commonly known as:  LASIX Take 1 tablet (40 mg total) by mouth daily.   gabapentin 100 MG capsule Commonly known as:  NEURONTIN Take 1 capsule (100 mg total) by mouth 3 (three) times daily.   Ipratropium-Albuterol 20-100 MCG/ACT Aers respimat Commonly known as:  COMBIVENT Inhale 1 puff into the lungs every 6 (six) hours.   loratadine 10 MG tablet Commonly known as:  CLARITIN Take 10 mg by mouth daily.   metoprolol tartrate 25 MG tablet Commonly known as:  LOPRESSOR Take 12.5 mg by mouth 2 (two) times daily.   MUCINEX 600 MG 12 hr tablet Generic drug:  guaiFENesin Take 1,200 mg by mouth 2 (two) times daily.   ondansetron 4 MG disintegrating tablet Commonly known as:  ZOFRAN-ODT Take 4 mg by mouth every 8 (eight) hours as needed for nausea or vomiting.   Oxycodone HCl 10 MG Tabs Take 1 tablet (10 mg total) by mouth every 4 (four) hours as  needed for up to 15 doses (pain/shortness of breath).   pantoprazole 40 MG tablet Commonly known as:  PROTONIX Take 1 tablet (40 mg total) by mouth 2 (two) times daily.   predniSONE 10 MG tablet Commonly known as:  DELTASONE Take 1 tablet (10 mg total) by mouth daily.   senna-docusate 8.6-50 MG tablet Commonly known as:  Senokot-S Take 2 tablets by mouth 2 (two) times daily. What changed:  how much to take   sucralfate 1 GM/10ML suspension Commonly known as:  CARAFATE Take 10 mLs (1 g total) by mouth 4 (four) times daily -  with meals and at bedtime.   THERA-TABS PO Take 1 tablet by mouth daily.   TUSSIN DM 10-100 MG/5ML liquid Generic drug:  Dextromethorphan-guaiFENesin Take 5 mLs by mouth every 12 (twelve) hours.        Relevant Imaging Results:  Relevant Lab Results:   Additional Information SSN [id number]  [name], LCSW

## 2017-07-25 NOTE — Discharge Summary (Signed)
Physician Discharge Summary  [name] UXL:[id number] DOB: 1960-09-11 DOA: 07/23/2017  PCP: [name], MD  Admit date: 07/23/2017 Discharge date: 07/25/2017  Admitted From: ALF Disposition:  ALF  Recommendations for Outpatient Follow-up:  1. Follow up with PCP in 1-2 weeks 2. Please have patient avoid exposure to fumes, dust, pollen which may trigger copd  Discharge Condition:Improved CODE STATUS:DNR Diet recommendation: heart healthy   Brief/Interim Summary: 57 y.o.malewith medical history significant ofstage IV of NSCLC on palliativechemotherapy,COPD on 2 L nasal cannula oxygen at home, hypertension, GERD, depression, anemia,dCHF, PE on Eliquis, CKD-3, who presents with shortness of breath and cough.  Patient states that he has worsening shortness of breath and cough in the past 2 or 3 days, which has been progressively getting worse. He coughs up yellow-colored mucus,no hemoptysis. Patient denies fever, but has chills. He states that sometimes he has chest pain, but currently no chest pain. Patient states that he has chronic intermittent nausea,vomiting loose stool bowel movement and chronic abdominal pain, which havenot changed. Denies symptoms of UTI. He did not notice any dark stool or bloody stool recently. No symptoms of UTI or unilateral weakness  pt was found to haveWBC 4.4, hemoglobin 7.0 which was 8.7 on 07/19/2017, BNP 49.6, pending BMP, temperature normal, tachycardia, tachypnea, oxygen saturation 98% on 8 L nasal cannula oxygen. Chest x-ray showed right small pleural effusion and COPD without infiltration.  Acute on chronic respiratory failure with hypoxiaand COPD exacerbation: -Patient reports symptoms began while facility was being painted with heavy fumes present -Suspect above fumes to be etiology for copd exacerbation -Patient was continued on scheduled Atrovent and prn Xopenex Nebs -O2 sats remained stable on [facility] -Urine S. pneumococcal  antigen neg -Respiratory viral panel neg -Patient ambulating on baseline O2  Chronic diastolic (congestive) heart failure: -2D echo on 07/01/2017 showed EF of 55 to 60% with grade 1 diastolic dysfunction. Patient has trace leg edema, but no pulmonary edema chest x-ray. BNP is normal at 49.6, CHF seems to be compensated.:  -CXR personally reviewed. R sided effusion seems to be similar to previous xray at previous admit -Continued on home Lasix 40 mg daily -continuemetoprolol as tolerated  Normocytic anemia: -Hemoglobin noted to have dropped from 8.7to7.0. Patient does not have bloody or dark stool. Suspect secondary to chemotherapy. -Transfused 1 unit of blood -Hgb noted to be 3.7 initially with repeat of 7.7, question lab error -Hgb at time of discharge of 7.9  Tobacco abuse: -nicotine patch continued  Adenocarcinoma of left lung, stage 4 (HCC):Patient is a followed up with [name].On palliative chemotherapy,dose was last Tuesday. -Recommend close follow-up with [name]  Abdominal pain:This is a chronic issue. Patient had CT abdomen/pelvis on 07/03/2017, which showed wall thickening of distal gastric antrum pylorus, could reflect gastritis. -continue patient on Protonix 40 mg twice daily as tolerated  HTN:  -Continue home medications:Metoprolol and Lasix -Will continue with IV hydralazine as needed  CKD (chronic kidney disease), stage III (HCC):Close to baseline. Baseline creatinine 1.3-1.6.His creatinine is 1.62, BUN 25. -Will repeat bmet in AM.  PE (pulmonary thromboembolism) ([place]) -initially held eliquis held secondary to low hgb -Repeat cbc stable and higher at 7.9 -no evidence of acute blood loss -Resume eliquis on discharge   Discharge Diagnoses:  Principal Problem:   Acute on chronic respiratory failure with hypoxia ([place]) Active Problems:   Tobacco abuse   Adenocarcinoma of left lung, stage 4 (HCC)   Abdominal pain   HTN  (hypertension)   COPD exacerbation (  [address])   CKD (chronic kidney disease), stage III ([place])   PE (pulmonary thromboembolism) (HCC)   Normocytic anemia   Chronic diastolic (congestive) heart failure ([place])    Discharge Instructions   Allergies as of 07/25/2017   No Known Allergies     Medication List    TAKE these medications   acetaminophen 500 MG tablet Commonly known as:  TYLENOL Take 1 tablet (500 mg total) by mouth 2 (two) times daily as needed for mild pain.   albuterol (2.5 MG/3ML) 0.083% nebulizer solution Commonly known as:  PROVENTIL 1 neb every 4-6 hours as needed for wheezing and shortness of breath   ALKA-SELTZER PLUS COLD PO Take 2 tablets by mouth at bedtime as needed (COUGH).   apixaban 5 MG Tabs tablet Commonly known as:  ELIQUIS Take 1 tablet (5 mg total) by mouth 2 (two) times daily. What changed:  how much to take   azithromycin 250 MG tablet Commonly known as:  ZITHROMAX 1 tab po daily x 4 more days, zero refills   dexamethasone 4 MG tablet Commonly known as:  DECADRON Take 4 mg by mouth 2 (two) times daily. The day before, the day of, and the day after chemotherapy every three weeks   escitalopram 5 MG tablet Commonly known as:  LEXAPRO Take 1 tablet (5 mg total) by mouth at bedtime.   feeding supplement (ENSURE ENLIVE) Liqd Take 237 mLs by mouth 3 (three) times daily between meals.   ferrous sulfate 325 (65 FE) MG tablet Take 1 tablet (325 mg total) by mouth 2 (two) times daily with a meal.   Fluticasone-Umeclidin-Vilant 100-62.5-25 MCG/INH Aepb Commonly known as:  TRELEGY ELLIPTA Inhale 1 puff into the lungs daily.   folic acid 1 MG tablet Commonly known as:  FOLVITE Take 1 tablet (1 mg total) by mouth daily.   furosemide 40 MG tablet Commonly known as:  LASIX Take 1 tablet (40 mg total) by mouth daily.   gabapentin 100 MG capsule Commonly known as:  NEURONTIN Take 1 capsule (100 mg total) by mouth 3 (three) times daily.    Ipratropium-Albuterol 20-100 MCG/ACT Aers respimat Commonly known as:  COMBIVENT Inhale 1 puff into the lungs every 6 (six) hours.   loratadine 10 MG tablet Commonly known as:  CLARITIN Take 10 mg by mouth daily.   metoprolol tartrate 25 MG tablet Commonly known as:  LOPRESSOR Take 12.5 mg by mouth 2 (two) times daily.   MUCINEX 600 MG 12 hr tablet Generic drug:  guaiFENesin Take 1,200 mg by mouth 2 (two) times daily.   ondansetron 4 MG disintegrating tablet Commonly known as:  ZOFRAN-ODT Take 4 mg by mouth every 8 (eight) hours as needed for nausea or vomiting.   Oxycodone HCl 10 MG Tabs Take 1 tablet (10 mg total) by mouth every 4 (four) hours as needed for up to 15 doses (pain/shortness of breath).   pantoprazole 40 MG tablet Commonly known as:  PROTONIX Take 1 tablet (40 mg total) by mouth 2 (two) times daily.   predniSONE 10 MG tablet Commonly known as:  DELTASONE Take 1 tablet (10 mg total) by mouth daily.   senna-docusate 8.6-50 MG tablet Commonly known as:  Senokot-S Take 2 tablets by mouth 2 (two) times daily. What changed:  how much to take   sucralfate 1 GM/10ML suspension Commonly known as:  CARAFATE Take 10 mLs (1 g total) by mouth 4 (four) times daily -  with meals and at bedtime.   THERA-TABS PO  Take 1 tablet by mouth daily.   TUSSIN DM 10-100 MG/5ML liquid Generic drug:  Dextromethorphan-guaiFENesin Take 5 mLs by mouth every 12 (twelve) hours.      Follow-up Information    [name], MD. Schedule an appointment as soon as possible for a visit in 1 week(s).   Specialty:  Neurology Contact information: [address] [name] [address] [phone]          No Known Allergies   Procedures/Studies: Ct Abdomen Pelvis Wo Contrast  Result Date: 07/03/2017 CLINICAL DATA:  Abdominal distension, worsening shortness of breath, lung cancer, pulmonary embolism, COPD, CHF EXAM: CT ABDOMEN AND PELVIS WITHOUT CONTRAST TECHNIQUE:  Multidetector CT imaging of the abdomen and pelvis was performed following the standard protocol without IV contrast. Sagittal and coronal MPR images reconstructed from axial data set. Patient drank dilute oral contrast for exam COMPARISON:  12/16/2016 FINDINGS: Lower chest: Moderate RIGHT pleural effusion. Peribronchial thickening, more pronounced in LEFT lower lobe with question mucous plugging emphysematous changes at LEFT base. Compressive atelectasis RIGHT lower lobe. Minimal LEFT pleural fluid. Hepatobiliary: Gallbladder and liver unremarkable Pancreas: Normal appearance Spleen: Normal appearance Adrenals/Urinary Tract: Adrenal glands, kidneys, ureters, and bladder normal appearance Stomach/Bowel: Visualization of proximal appendix in RIGHT pelvis, grossly unremarkable. Stool throughout colon. Large and small bowel loops normal appearance. Distended stomach with wall thickening of antrum and pylorus extending in the duodenal bulb could reflect gastritis. Vascular/Lymphatic: Atherosclerotic calcifications aorta and iliac arteries. Tip of central venous catheter at cavoatrial junction. No adenopathy. Reproductive: Minimal prostatic enlargement. Other: Low-attenuation free fluid in pelvis and minimally at scattered in upper abdomen. No free air. No hernia. Musculoskeletal: Sclerotic T12 vertebral body again identified, question due to osseous metastatic disease. Additional sclerotic focus at the inferior L2 vertebral body. Lytic lesion with sclerotic rim at inferior L3 unchanged. Metallic foreign body at inferior medial LEFT buttock. IMPRESSION: RIGHT pleural effusion and basilar atelectasis. Minimal LEFT pleural effusion and scattered ascites greatest in pelvis. Wall thickening of distal gastric antrum pylorus, could reflect gastritis. Suspected sclerotic metastatic lesions of T12 and L2 with an additional chronic well-circumscribed lytic focus at inferior L3. Electronically Signed   By: Lavonia Dana M.D.   On:  07/03/2017 13:02   Dg Chest 1 View  Result Date: 07/11/2017 CLINICAL DATA:  Status post thoracentesis on the right EXAM: CHEST  1 VIEW COMPARISON:  07/11/2017 FINDINGS: Cardiac shadows within normal limits. Right chest wall port is again seen and stable. Interval right thoracentesis has been performed. A minimal basilar pneumothorax is noted. This is likely related incomplete re-expansion of the right lower lobe. Correlation with patient's symptomatology is recommended. No other focal abnormality is seen. Stable left pleural effusion is noted. IMPRESSION: Small right basilar pneumothorax. This is likely related incomplete re-expansion of the lower lobe. Correlate with patient's symptomatology. Electronically Signed   By: Inez Catalina M.D.   On: 07/11/2017 13:47   Dg Chest 1 View  Result Date: 07/05/2017 CLINICAL DATA:  S/p right sided thoracentesis today, 1.2 liters removed. EXAM: CHEST  1 VIEW COMPARISON:  Chest x-ray dated 07/04/2017. FINDINGS: Improved aeration at the RIGHT lung base, significantly decreased amount of pleural effusion compared to the earlier CT of 06/30/2017. Stable small LEFT pleural effusion and/or atelectasis. No pneumothorax seen. Heart size and mediastinal contours are stable. RIGHT chest wall Port-A-Cath is stable in position with tip overlying the RIGHT atrium. No acute or suspicious osseous finding. IMPRESSION: Status post thoracentesis.  No pneumothorax. Electronically Signed  By: Franki Cabot M.D.   On: 07/05/2017 14:06   Dg Chest 2 View  Result Date: 07/20/2017 CLINICAL DATA:  Shortness of breath. EXAM: CHEST - 2 VIEW COMPARISON:  Chest x-ray from yesterday. FINDINGS: Unchanged right chest wall port catheter. The heart size and mediastinal contours are within normal limits. Normal pulmonary vascularity. The lungs remain hyperinflated with emphysematous changes. Increased interstitial markings in the right lung are similar to prior study. Unchanged small right pleural  effusion. Unchanged left upper lobe spiculated mass. No acute osseous abnormality. IMPRESSION: 1. COPD with unchanged left upper lobe spiculated mass and small right pleural effusion. Electronically Signed   By: Titus Dubin M.D.   On: 07/20/2017 23:28   Dg Chest 2 View  Result Date: 07/18/2017 CLINICAL DATA:  Lung cancer. EXAM: CHEST - 2 VIEW COMPARISON:  Chest x-ray 07/16/2017, 05/28/2017, 03/17/2017. CT 03/13/2017. FINDINGS: PowerPort catheter noted with tip over right atrium. Cardiomegaly with normal pulmonary vascularity. Hyperexpansion of both lung fields noted consistent with COPD. Mild mid lung field basilar subsegmental atelectasis. Unchanged bilateral basilar pleural thickening consistent with persistent effusions and or scarring. IMPRESSION: 1.  PowerPort catheter noted in stable position. 2. COPD. Mild subsegmental atelectasis noted the mid lung fields and both bases. Mild bilateral basilar pleural thickening consistent with persistent effusions and/or scarring. No acute abnormality identified. 3.  Stable cardiomegaly.  No pulmonary venous congestion. Electronically Signed   By: Marcello Moores  Register   On: 07/18/2017 14:02   Dg Chest 2 View  Result Date: 07/16/2017 CLINICAL DATA:  Weakness, dyspnea EXAM: CHEST - 2 VIEW COMPARISON:  07/13/2017 chest radiograph. FINDINGS: Right subclavian MediPort terminates at the cavoatrial junction. Stable cardiomediastinal silhouette with normal heart size. No pneumothorax. Stable blunting of the right greater than left costophrenic angles. No pulmonary edema. Severe emphysema, asymmetrically prominent in the left lung, with lung hyperinflation. No acute consolidative airspace disease. Stable mild scarring at the lung bases. IMPRESSION: 1. No acute cardiopulmonary disease. 2. Lung hyperinflation and severe emphysema asymmetrically involving the left lung, compatible with COPD. 3. Stable blunting of the right greater than left costophrenic angles, which could  represent small pleural effusions and/or pleural-parenchymal scarring. Electronically Signed   By: Ilona Sorrel M.D.   On: 07/16/2017 16:28   Dg Chest 2 View  Result Date: 07/13/2017 CLINICAL DATA:  57 year old male with dyspnea and chest pain. EXAM: CHEST - 2 VIEW COMPARISON:  Chest radiograph dated 07/12/2017 FINDINGS: Right-sided Port-A-Cath with tip in stable position. There is severe emphysematous changes of the lungs. Faint nodular density in the left upper lobe better seen on the prior CT. There is no pneumothorax. Small stable bilateral pleural effusions, right greater left. The cardiac silhouette is within normal limits. No acute osseous pathology. IMPRESSION: 1. No acute cardiopulmonary process. 2. Severe emphysema and stable bilateral pleural effusions with Electronically Signed   By: Anner Crete M.D.   On: 07/13/2017 21:53   Dg Chest 2 View  Result Date: 07/11/2017 CLINICAL DATA:  Shortness of breath.  History of lung cancer. EXAM: CHEST - 2 VIEW COMPARISON:  Chest 07/05/2017, CT chest 06/30/2017 FINDINGS: Heart size and pulmonary vascularity are normal. Focal scarring in the left suprahilar and left upper lung region corresponding to lesion better seen at previous CT chest. Small bilateral pleural effusions demonstrating mild increased since previous study. There is developing right perihilar and basilar infiltration which could represent asymmetrical edema or pneumonia. No pneumothorax. Power port type central venous catheter with tip over the cavoatrial junction region. IMPRESSION:  Small but increasing bilateral pleural effusions. Increasing infiltration or edema in the right perihilar and basilar lung regions. Focal scarring in the left upper lung is identified, corresponding to changes better seen on previous CT scan. Electronically Signed   By: Lucienne Capers M.D.   On: 07/11/2017 00:30   Dg Chest 2 View  Result Date: 07/04/2017 CLINICAL DATA:  Shortness of breath EXAM: CHEST - 2  VIEW COMPARISON:  July 04, 2017 FINDINGS: A right Port-A-Cath is in good position. Bilateral pleural effusions are stable. Stable left upper lobe mass, unchanged. Increased lucency in the left lung compared to the right is stable. Increased haziness on the right is stable compared to the left. No change in the cardiomediastinal silhouette. IMPRESSION: No interval change in bilateral pleural effusions, a left upper lobe mass, the right Port-A-Cath, or haziness in the right chest. Electronically Signed   By: Dorise Bullion III M.D   On: 07/04/2017 22:33   Dg Chest 2 View  Result Date: 07/04/2017 CLINICAL DATA:  Worsening shortness of Breath EXAM: CHEST - 2 VIEW COMPARISON:  06/30/2017 FINDINGS: Cardiac shadow is stable. Right chest wall port is again seen. Right pleural effusion is seen and stable from the previous CT examination. Stable left upper lobe mass lesion is noted. No acute bony abnormality is noted. IMPRESSION: Stable left upper lobe mass. Right-sided pleural effusion similar to that seen on recent CT examination. Electronically Signed   By: Inez Catalina M.D.   On: 07/04/2017 08:38   Dg Chest 2 View  Result Date: 06/30/2017 CLINICAL DATA:  Initial evaluation for acute shortness of breath. History of lung cancer. EXAM: CHEST - 2 VIEW COMPARISON:  Prior radiograph from 06/25/2017. FINDINGS: Right-sided Port-A-Cath in place with tip overlying the proximal right atrium. Cardiac and mediastinal silhouettes stable in size and contour, and remain within normal limits. Lungs normally inflated. Small moderate right pleural effusion is increased from previous. Scattered vascular congestion with crowding throughout the right lung, progressed from previous. Probable superimposed right basilar atelectasis. Blunting of the left costophrenic angle, which could reflect effusion and/or chronic pleural reaction/scarring, similar to previous. Left lung otherwise largely clear. Left perihilar architectural distortion  with possible spiculated nodule, similar to previous. No pneumothorax. No acute osseous abnormality. IMPRESSION: 1. Small to moderate layering right pleural effusion, increased from previous, with worsened vascular congestion/crowding and probable atelectatic changes within the right lung. 2. Blunting of the left costophrenic angle, which could reflect small effusion and/or chronic pleural reaction/scarring, similar to previous. 3. Left perihilar architectural distortion with underlying spiculated nodular density, consistent with known lung malignancy. Electronically Signed   By: Jeannine Boga M.D.   On: 06/30/2017 04:36   Ct Angio Chest Pe W And/or Wo Contrast  Addendum Date: 06/30/2017   ADDENDUM REPORT: 06/30/2017 08:21 ADDENDUM: Critical Value/emergent results were called by telephone at the time of interpretation on 06/30/2017 at 8:21 am to [place] in the ED who verbally acknowledged these results. Electronically Signed   By: Genevie Ann M.D.   On: 06/30/2017 08:21   Result Date: 06/30/2017 CLINICAL DATA:  57 year old male with stage IV lung cancer and severe shortness of breath. EXAM: CT ANGIOGRAPHY CHEST WITH CONTRAST TECHNIQUE: Multidetector CT imaging of the chest was performed using the standard protocol during bolus administration of intravenous contrast. Multiplanar CT image reconstructions and MIPs were obtained to evaluate the vascular anatomy. CONTRAST:  1103mL ISOVUE-370 IOPAMIDOL (ISOVUE-370) INJECTION 76% COMPARISON:  Chest CT 04/13/2017, chest CTA 03/13/2017. FINDINGS: Cardiovascular: Good contrast bolus  timing in the pulmonary arterial tree. There is no right lung pulmonary artery filling defect. 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 However, this is inseparable from abnormal peribronchial soft  tissue tracking to the left hilum from the spiculated left upper lobe tumor. The vessel thrombus is primarily nonocclusive, although distal obliteration of the left upper lobe artery occurs but is stable since 03/13/2017. 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 Additionally, the right chest Port-A-Cath was injected. It is unclear whether there could be thrombus within the SVC and right atrium (series 5, image 184). This could be mixing artifact. Stable cardiac size. No pericardial effusion. Stable visible aorta. Mediastinum/Nodes: Loss of normal mediastinal fat planes, but no discrete mediastinal mass identified. Lungs/Pleura: Continued moderate size layering right pleural effusion, stable since March. Simple fluid density. Trace layering left pleural fluid. Stable major airway patency. Mildly regressed size and nodularity of the left upper lobe spiculated mass since March and 03/13/2017 (compare series 6, image 37 today to series 6, image 45 in February). However, contiguous opacity from the mass to the left hilum persists. Underlying emphysema, severe on the left.  No new pulmonary opacity. Upper Abdomen: Small volume perihepatic free fluid along the periphery of the visible right hepatic lobe is new, but gastrohepatic ligament fluid as regressed and appears virtually resolved since 04/13/2017. Liver parenchyma, visible spleen, adrenal glands, kidneys, pancreas, and bowel appear within normal limits. Musculoskeletal: Stable visualized osseous structures. Heterogeneous sclerosis of T12 remain stable since February. Review of the MIP images confirms the above findings. IMPRESSION: 1. Positive for evidence of acute pulmonary thrombus at the left main pulmonary artery bifurcation and extending into the left upper lobe. Much of the visible thrombus is nonocclusive, and it might be tumor related, with  adjacent peribronchial opacity tracking from the left upper lobe carcinoma to the hilum. 2. 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 No other pulmonary thrombus identified. 3. Mildly regressed size of the spiculated left upper lung mass since March. 4. Continued moderate size layering right pleural effusion. Underlying emphysema. 5. Small volume perihepatic ascites is new since March but the previously-seen gastrohepatic ligament fluid has regressed. Electronically Signed: By: Genevie Ann M.D. On: 06/30/2017 08:13   Ct Abdomen Pelvis W Contrast  Result Date: 07/16/2017 CLINICAL DATA:  Shortness of breath and dizziness. Diarrhea and nausea. EXAM: CT ABDOMEN AND PELVIS WITH CONTRAST TECHNIQUE: Multidetector CT imaging of the abdomen and pelvis was performed using the standard protocol following bolus administration of intravenous contrast. CONTRAST:  12mL ISOVUE-300 IOPAMIDOL (ISOVUE-300) INJECTION 61% COMPARISON:  CT abdomen pelvis 07/03/2017 FINDINGS: LOWER CHEST: Small pleural effusions, right greater than left. Bibasilar atelectasis. HEPATOBILIARY: Focal fat deposition along the course of the left portal vein. No focal liver lesion otherwise. No biliary dilatation. Gallbladder is partially contracted. PANCREAS: Normal parenchymal contours without ductal dilatation. No peripancreatic fluid collection. SPLEEN: Normal. ADRENALS/URINARY TRACT: --Adrenal glands: Normal. --Right kidney/ureter: No hydronephrosis, nephroureterolithiasis, perinephric stranding or solid renal mass. --Left kidney/ureter: No hydronephrosis, nephroureterolithiasis, perinephric stranding or solid renal mass. --Urinary bladder: Normal for degree of distention STOMACH/BOWEL: --Stomach/Duodenum: Persistent esophageal wall thickening near the gastroesophageal junction. --Small bowel: No dilatation or inflammation. --Colon: No focal abnormality. --Appendix: Not visualized.  No right lower quadrant  inflammation or free fluid. VASCULAR/LYMPHATIC: Atherosclerotic calcification is present within the non-aneurysmal abdominal aorta, without hemodynamically significant stenosis. The portal vein, splenic vein, superior mesenteric vein and IVC are patent. No abdominal or pelvic lymphadenopathy. REPRODUCTIVE: Normal prostate and seminal vesicles. MUSCULOSKELETAL. Unchanged lucent lesion of the left L3 vertebral body. T12 vertebral body remains sclerotic. OTHER: None. IMPRESSION: 1. No acute abnormality of the abdomen or pelvis. 2. Small pleural effusions, right larger than left but the right effusion is decreased in size from 07/03/2017. 3.  Aortic Atherosclerosis (ICD10-I70.0). 4. Unchanged sclerotic appearance of T12 and lucent lesion of the inferior endplate of L3. 5. Persistent wall thickening at the gastroesophageal junction. Electronically Signed   By: Ulyses Jarred M.D.   On: 07/16/2017 17:32   US Abdomen Limited  Result Date: 07/02/2017 CLINICAL DATA:  Evaluate for ascites. EXAM: ULTRASOUND ABDOMEN LIMITED COMPARISON:  None. FINDINGS: Large right pleural effusion.  Small ascites in the abdomen. IMPRESSION: Large right pleural effusion.  Small ascites in the abdomen. Electronically Signed   By: Dorise Bullion III M.D   On: 07/02/2017 18:07   Dg Chest Portable 1 View  Result Date: 07/23/2017 CLINICAL DATA:  57 y/o M; shortness of breath and history of lung cancer. EXAM: PORTABLE CHEST 1 VIEW COMPARISON:  07/20/2017 chest radiograph. FINDINGS: Stable right port catheter with tip projecting over lower SVC. Stable heart size. Stable left upper lobe spiculated nodule. Stable small to moderate right pleural effusion. No new focal consolidation. No pneumothorax. Bones are unremarkable. Stable findings of COPD. IMPRESSION: Stable COPD, small to moderate right pleural effusion, and left upper lobe spiculated nodule. Electronically Signed   By: Kristine Garbe M.D.   On: 07/23/2017 23:41   Dg Chest Port  1 View  Result Date: 07/19/2017 CLINICAL DATA:  Shortness of breath for 2 days EXAM: PORTABLE CHEST 1 VIEW COMPARISON:  07/18/2017 FINDINGS: Right-sided chest port is again noted in satisfactory position. Lungs are hyperinflated bilaterally consistent with COPD. Chronic blunting of the costophrenic angles is again seen. Slight increased interstitial markings are noted in the right lung similar to that seen on the prior exam. No pneumothorax is seen. No acute bony abnormality is noted. IMPRESSION: COPD and chronic changes similar to that seen on the previous day. Electronically Signed   By: Inez Catalina M.D.   On: 07/19/2017 15:17   Dg Chest Port 1 View  Result Date: 07/12/2017 CLINICAL DATA:  Pneumothorax on right for follow-up. Thoracentesis 07/11/2017. lung cancer. EXAM: PORTABLE CHEST 1 VIEW COMPARISON:  07/11/2016 FINDINGS: Right IJ Port-A-Cath unchanged. Lungs are adequately inflated and demonstrate emphysematous disease. Small amount left pleural fluid unchanged. Slight worsening of a small amount right pleural fluid. Stable small right basilar pneumothorax. Stable mild prominence of the right perihilar markings. Stable nodule opacity over the left upper lung previously evaluated by CT compatible with known lung cancer. Cardiomediastinal silhouette and remainder the exam is unchanged. IMPRESSION: Stable small right basilar pneumothorax. Small amount right pleural fluid slightly worse. Stable small left pleural effusion. Mild stable prominence of the right perihilar markings. Stable nodular opacity over the left upper lung representing known lung cancer. Electronically Signed   By: Marin Olp M.D.   On: 07/12/2017 07:25   Dg Abd Portable 1v  Result Date: 07/11/2017 CLINICAL DATA:  Abdominal pain.  Nausea vomiting.  Constipation. EXAM: PORTABLE ABDOMEN - 1 VIEW COMPARISON:  CT 07/03/2017. FINDINGS: Soft tissue structures are unremarkable. No bowel distention. Stool noted throughout the colon.  Metallic fragment noted over the pelvis. Pelvic calcifications consistent phleboliths. No acute bony abnormality. IMPRESSION: 1.  Metallic fragment noted over the pelvis. 2. No acute abnormality otherwise noted. No bowel distention. Stool noted throughout the colon. Electronically Signed   By: Marcello Moores  Register   On: 07/11/2017 10:23   US Thoracentesis Asp Pleural Space W/img Guide  Result Date: 07/11/2017 INDICATION: Patient with history of metastatic lung cancer, dyspnea, recurrent right pleural effusion. Request made for diagnostic and therapeutic right thoracentesis. EXAM: ULTRASOUND GUIDED DIAGNOSTIC AND THERAPEUTIC RIGHT THORACENTESIS MEDICATIONS: None COMPLICATIONS: None immediate. PROCEDURE: An ultrasound guided thoracentesis was thoroughly discussed with the patient and questions answered. The benefits, risks, alternatives and complications were also discussed. The patient understands and wishes to proceed with the procedure. Written consent was obtained. Ultrasound was performed to localize and mark an adequate pocket of fluid in the right chest. The area was then prepped and draped in the normal sterile fashion. 1% Lidocaine was used for local anesthesia. Under ultrasound guidance a 6 Fr Safe-T-Centesis catheter was introduced. Thoracentesis was performed. The catheter was removed and a dressing applied. FINDINGS: A total of approximately 680 cc of turbid, bloody fluid was removed. Samples were sent to the laboratory as requested by the clinical team. IMPRESSION: Successful ultrasound guided diagnostic and therapeutic right thoracentesis yielding 680 cc of pleural fluid. Patient was noted to have small right basilar pneumothorax on follow-up chest x-ray, questionable ex vacuo origin. Patient currently stable. We will repeat chest x-ray in a.m. to ensure stability. Read by: Rowe Robert, PA-C Electronically Signed   By: Lucrezia Europe M.D.   On: 07/11/2017 14:44   US Thoracentesis Asp Pleural Space W/img  Guide  Result Date: 07/05/2017 INDICATION: Patient with history of metastatic lung cancer, dyspnea, and recurrent right-sided pleural effusion. Request is made for diagnostic and therapeutic right thoracentesis. EXAM: ULTRASOUND GUIDED DIAGNOSTIC AND THERAPEUTIC RIGHT THORACENTESIS MEDICATIONS: 10 mL of 1% lidocaine COMPLICATIONS: None immediate. PROCEDURE: An ultrasound guided thoracentesis was thoroughly discussed with the patient and questions answered. The benefits, risks, alternatives and complications were also discussed. The patient understands and wishes to proceed with the procedure. Written consent was obtained. Ultrasound was performed to localize and mark an adequate pocket of fluid in the right chest. The area was then prepped and draped in the normal sterile fashion. 1% Lidocaine was used for local anesthesia. Under ultrasound guidance a 6 Fr Safe-T-Centesis catheter was introduced. Thoracentesis was performed. The catheter was removed and a dressing applied. FINDINGS: A total of approximately 1.2 L of blood-tinged fluid was removed. Samples were sent to the laboratory as requested by the clinical team. IMPRESSION: Successful ultrasound guided right thoracentesis yielding 1.2 L of pleural fluid. Read by: Earley Abide, PA-C No pneumothorax on follow-up radiograph. Electronically Signed   By: Lucrezia Europe M.D.   On: 07/05/2017 14:12     Subjective: Feels well. Happy to be discharged today  Discharge Exam: Vitals:   07/24/17 2034 07/25/17 0505  BP: 116/79 119/75  Pulse: 60 (!) 55  Resp: 15 18  Temp: 97.6 F (36.4 C) 97.7 F (36.5 C)  SpO2: 100% 100%   Vitals:   07/24/17 1353 07/24/17 2000 07/24/17 2034 07/25/17 0505  BP: 117/66  116/79 119/75  Pulse: 64  60 (!) 55  Resp: 18  15 18   Temp: 97.9 F (36.6 C)  97.6 F (36.4 C) 97.7 F (36.5 C)  TempSrc: Oral  Oral Oral  SpO2: 100% 98% 100% 100%  Weight:  Height:        General: Pt is alert, awake, not in acute  distress Cardiovascular: RRR, S1/S2 +, no rubs, no gallops Respiratory: CTA bilaterally, no wheezing, no rhonchi Abdominal: Soft, NT, ND, bowel sounds + Extremities: no edema, no cyanosis   The results of significant diagnostics from this hospitalization (including imaging, microbiology, ancillary and laboratory) are listed below for reference.     Microbiology: Recent Results (from the past 240 hour(s))  Culture, blood (routine x 2) Call MD if unable to obtain prior to antibiotics being given     Status: None (Preliminary result)   Collection Time: 07/24/17  2:50 AM  Result Value Ref Range Status   Specimen Description   Final    BLOOD LEFT HAND Performed at [facility] [address] [place], [place] [address]    Special Requests   Final    BOTTLES DRAWN AEROBIC AND ANAEROBIC Blood Culture adequate volume   Culture PENDING  Incomplete   Report Status PENDING  Incomplete  Culture, blood (routine x 2) Call MD if unable to obtain prior to antibiotics being given     Status: None (Preliminary result)   Collection Time: 07/24/17  2:55 AM  Result Value Ref Range Status   Specimen Description BLOOD RIGHT ARM  Final   Special Requests   Final    AEROBIC BOTTLE ONLY Blood Culture adequate volume Performed at [facility] [address] [place], [place] [address]    Culture PENDING  Incomplete   Report Status PENDING  Incomplete  Respiratory Panel by PCR     Status: None   Collection Time: 07/24/17  3:19 AM  Result Value Ref Range Status   Adenovirus NOT DETECTED NOT DETECTED Final   Coronavirus 229E NOT DETECTED NOT DETECTED Final   Coronavirus HKU1 NOT DETECTED NOT DETECTED Final   Coronavirus NL63 NOT DETECTED NOT DETECTED Final   Coronavirus OC43 NOT DETECTED NOT DETECTED Final   Metapneumovirus NOT DETECTED NOT DETECTED Final   Rhinovirus / Enterovirus NOT DETECTED NOT DETECTED Final   Influenza A NOT DETECTED NOT DETECTED Final    Influenza B NOT DETECTED NOT DETECTED Final   Parainfluenza Virus 1 NOT DETECTED NOT DETECTED Final   Parainfluenza Virus 2 NOT DETECTED NOT DETECTED Final   Parainfluenza Virus 3 NOT DETECTED NOT DETECTED Final   Parainfluenza Virus 4 NOT DETECTED NOT DETECTED Final   Respiratory Syncytial Virus NOT DETECTED NOT DETECTED Final   Bordetella pertussis NOT DETECTED NOT DETECTED Final   Chlamydophila pneumoniae NOT DETECTED NOT DETECTED Final   Mycoplasma pneumoniae NOT DETECTED NOT DETECTED Final    Comment: Performed at [facility], [place] [address], [place],  [address]  MRSA PCR Screening     Status: None   Collection Time: 07/24/17  4:50 AM  Result Value Ref Range Status   MRSA by PCR NEGATIVE NEGATIVE Final    Comment:        The GeneXpert MRSA Assay (FDA approved for NASAL specimens only), is one component of a comprehensive MRSA colonization surveillance program. It is not intended to diagnose MRSA infection nor to guide or monitor treatment for MRSA infections. Performed at [facility], [place] [address], [place],  [address]      Labs: BNP (last 3 results) Recent Labs    07/04/17 2146 07/11/17 0450 07/23/17 2322  BNP 168.6* 148.3* 43.1   Basic Metabolic Panel: Recent Labs  Lab 07/19/17 1445 07/24/17 0147  07/24/17 0409 07/25/17 0359  NA 143 138  --  140  K 3.1* 3.5  --  3.8  CL 100 99  --  96*  CO2 32 26  --  36*  GLUCOSE 129* 265*  --  139*  BUN 33* 25*  --  28*  CREATININE 1.38* 1.62*  --  1.75*  CALCIUM 9.0 8.9  --  9.2  MG  --   --  1.9  --    Liver Function Tests: No results for input(s): AST, ALT, ALKPHOS, BILITOT, PROT, ALBUMIN in the last 168 hours. Recent Labs  Lab 07/24/17 0203  LIPASE 22   No results for input(s): AMMONIA in the last 168 hours. CBC: Recent Labs  Lab 07/19/17 1445 07/23/17 2321 07/24/17 0409 07/24/17 1116 07/25/17 0359  WBC 17.4* 4.4 5.9 4.0 5.6  NEUTROABS 17.0* 4.0  --   --   --    HGB 8.7* 7.0* 3.7* 7.7* 7.9*  HCT 26.4* 21.3* 11.2* 23.7* 23.1*  MCV 101.5* 98.6 98.2 96.0 97.1  PLT 225 123* 125* 99* 89*   Cardiac Enzymes: Recent Labs  Lab 07/19/17 1445  TROPONINI 0.05*   BNP: Invalid input(s): POCBNP CBG: No results for input(s): GLUCAP in the last 168 hours. D-Dimer No results for input(s): DDIMER in the last 72 hours. Hgb A1c No results for input(s): HGBA1C in the last 72 hours. Lipid Profile No results for input(s): CHOL, HDL, LDLCALC, TRIG, CHOLHDL, LDLDIRECT in the last 72 hours. Thyroid function studies No results for input(s): TSH, T4TOTAL, T3FREE, THYROIDAB in the last 72 hours.  Invalid input(s): FREET3 Anemia work up No results for input(s): VITAMINB12, FOLATE, FERRITIN, TIBC, IRON, RETICCTPCT in the last 72 hours. Urinalysis    Component Value Date/Time   COLORURINE YELLOW 07/16/2017 [place] 07/16/2017 1614   LABSPEC 1.011 07/16/2017 1614   PHURINE 6.0 07/16/2017 1614   GLUCOSEU NEGATIVE 07/16/2017 1614   HGBUR NEGATIVE 07/16/2017 1614   BILIRUBINUR SMALL (A) 07/16/2017 1614   KETONESUR NEGATIVE 07/16/2017 1614   PROTEINUR NEGATIVE 07/16/2017 1614   UROBILINOGEN 0.2 05/29/2011 0122   NITRITE NEGATIVE 07/16/2017 1614   LEUKOCYTESUR NEGATIVE 07/16/2017 1614   Sepsis Labs Invalid input(s): PROCALCITONIN,  WBC,  LACTICIDVEN Microbiology Recent Results (from the past 240 hour(s))  Culture, blood (routine x 2) Call MD if unable to obtain prior to antibiotics being given     Status: None (Preliminary result)   Collection Time: 07/24/17  2:50 AM  Result Value Ref Range Status   Specimen Description   Final    BLOOD LEFT HAND Performed at [facility], [place] [address] [place], [place] [address]    Special Requests   Final    BOTTLES DRAWN AEROBIC AND ANAEROBIC Blood Culture adequate volume   Culture PENDING  Incomplete   Report Status PENDING  Incomplete  Culture, blood (routine x 2) Call MD if  unable to obtain prior to antibiotics being given     Status: None (Preliminary result)   Collection Time: 07/24/17  2:55 AM  Result Value Ref Range Status   Specimen Description BLOOD RIGHT ARM  Final   Special Requests   Final    AEROBIC BOTTLE ONLY Blood Culture adequate volume Performed at [facility] [address], [place], [place] [address]    Culture PENDING  Incomplete   Report Status PENDING  Incomplete  Respiratory Panel by PCR     Status: None   Collection Time: 07/24/17  3:19 AM  Result Value Ref Range Status   Adenovirus NOT DETECTED NOT DETECTED Final   Coronavirus 229E NOT DETECTED NOT DETECTED Final   Coronavirus HKU1 NOT DETECTED NOT DETECTED Final   Coronavirus NL63 NOT DETECTED NOT DETECTED Final   Coronavirus OC43 NOT DETECTED NOT DETECTED Final   Metapneumovirus NOT DETECTED NOT DETECTED Final   Rhinovirus / Enterovirus NOT DETECTED NOT DETECTED Final   Influenza A NOT DETECTED NOT DETECTED Final   Influenza B NOT DETECTED NOT DETECTED Final   Parainfluenza Virus 1 NOT DETECTED NOT DETECTED Final   Parainfluenza Virus 2 NOT DETECTED NOT DETECTED Final   Parainfluenza Virus 3 NOT DETECTED NOT DETECTED Final   Parainfluenza Virus 4 NOT DETECTED NOT DETECTED Final   Respiratory Syncytial Virus NOT DETECTED NOT DETECTED Final   Bordetella pertussis NOT DETECTED NOT DETECTED Final   Chlamydophila pneumoniae NOT DETECTED NOT DETECTED Final   Mycoplasma pneumoniae NOT DETECTED NOT DETECTED Final    Comment: Performed at [facility], [place] [address], [place], [place] [address]  MRSA PCR Screening     Status: None   Collection Time: 07/24/17  4:50 AM  Result Value Ref Range Status   MRSA by PCR NEGATIVE NEGATIVE Final    Comment:        The GeneXpert MRSA Assay (FDA approved for NASAL specimens only), is one component of a comprehensive MRSA colonization surveillance program. It is not intended to diagnose MRSA infection nor to guide  or monitor treatment for MRSA infections. Performed at [facility], [place] [address] [place], [place] [address]    Time spent: 55min  SIGNED:   Marylu Lund, MD  [facility] 07/25/2017, 10:27 AM  If 7PM-7AM, please contact night-coverage [email or web address] Password [email or web address]

## 2017-07-25 NOTE — Clinical Social Work Note (Signed)
Patient recently assessed on 07/03/2017 see assessment below, no psychosocial changes reported. Patient reported plan to return to [name]. [employer] member [name] confirmed patient's ability to return.CSW will continue to follow and assist with discharge planning.  Clinical Social Work Assessment  Patient Details  Name: [name] MRN: [id number] Date of Birth: [birth year]  Date of referral:    07/25/2017              Reason for consult:       Discharge Planning            Permission sought to share information with:    Permission granted to share information::     Name::        Agency::     Relationship::     Contact Information:     Housing/Transportation Living arrangements for the past 2 months:   Banker of Information:   Patient Patient Interpreter Needed:   None Criminal Activity/Legal Involvement Pertinent to Current Situation/Hospitalization:   No Significant Relationships:   Parents,siblings  Lives with:   Facility Resident  Do you feel safe going back to the place where you live?   Yes Need for family participation in patient care:   No  Care giving concerns:  Patient is a [name] term care resident at [name]. Patient reported that he is starting need assistance with bathing and dressing. Patient uses a walker at ALF. PT recommending home health PT.   Social Worker assessment / plan:  CSW spoke with patient at bedside regarding discharge planning. Patient reported that he plans to return to [name]. Patient reported that he will need PTAR.  CSW spoke with [name]F staff member [name], who confirmed patient's ability to return. Staff reported that they use [facility] for palliative services and that they don't set it up for patient.   Palliative NP recommended that patient be followed by palliative care at ALF. RNCM notified to follow up with agency ALF uses for palliative care.   CSW  will complete FL2 and send clinical information to SNF.  CSW will continue to follow and assist with discharge planning.  Employment status:   Disabled Forensic scientist:   Medicaid In [place] PT Recommendations:   Not Assessed at this time Information / Referral to community resources:   Other (Patient is a [name] term care resident at ALF)  Patient/Family's Response to care:  Patient appreciative of CSW assistance with discharge planning.  Patient/Family's Understanding of and Emotional Response to Diagnosis, Current Treatment, and Prognosis:  Patient presented calm and remained engaged throughout the assessment. Patient and CSW discussed patient's prognosis, patient reported that his prognosis is poor. Patient was accepting of prognosis and reported that he is trying to adapt to his condition. Patient reported that he is trying "keep hope alive" and requested prayer. CSW provided emotional support and active listening. Patient reported that he has support from his mom and sisters.   Emotional Assessment Appearance:   Appears Stated Age Attitude/Demeanor/Rapport:   Cooperative Affect (typically observed):   Calm, Hopeful Orientation:   Oriented to self, Oriented to Situation, Oriented to Place, Oriented to Time  Alcohol / Substance use:   Not Applicable Psych involvement (Current and /or in the community):   No   Discharge Needs  Concerns to be addressed:   Care Coordination Readmission within the last 30 days:   Yes Current discharge risk:   Terminally Ill Barriers to  Discharge:   No Barriers Identified    Johnathan Medin, LCSW 07/25/2017, 11:33 AM

## 2017-07-25 NOTE — Progress Notes (Signed)
HHPT order received from MD. Pt states he has used [facility] in the past and would like to use them again. [facility] rep alerted of referral. Marney Doctor RN,BSN,NCM [phone]

## 2017-07-25 NOTE — Progress Notes (Signed)
Pt to be discharged today back to [name]. Report called to facility and [facility] accepting report for this facility. VSS and no acute changes with Pt's assessment at time of discharge.

## 2017-07-26 ENCOUNTER — Encounter (HOSPITAL_COMMUNITY): Payer: Self-pay | Admitting: Emergency Medicine

## 2017-07-26 ENCOUNTER — Observation Stay (HOSPITAL_BASED_OUTPATIENT_CLINIC_OR_DEPARTMENT_OTHER)
Admission: EM | Admit: 2017-07-26 | Discharge: 2017-07-28 | Disposition: A | Discharge status: Nursing Home (Includes ICF) | Payer: Medicaid Other | Source: Home / Self Care | Attending: Emergency Medicine | Admitting: Emergency Medicine

## 2017-07-26 DIAGNOSIS — Z86711 Personal history of pulmonary embolism: Secondary | ICD-10-CM

## 2017-07-26 DIAGNOSIS — N183 Chronic kidney disease, stage 3 unspecified: Secondary | ICD-10-CM | POA: Diagnosis present

## 2017-07-26 DIAGNOSIS — I130 Hypertensive heart and chronic kidney disease with heart failure and stage 1 through stage 4 chronic kidney disease, or unspecified chronic kidney disease: Secondary | ICD-10-CM

## 2017-07-26 DIAGNOSIS — G8929 Other chronic pain: Secondary | ICD-10-CM | POA: Insufficient documentation

## 2017-07-26 DIAGNOSIS — Z79899 Other long term (current) drug therapy: Secondary | ICD-10-CM

## 2017-07-26 DIAGNOSIS — R079 Chest pain, unspecified: Secondary | ICD-10-CM

## 2017-07-26 DIAGNOSIS — D6959 Other secondary thrombocytopenia: Secondary | ICD-10-CM

## 2017-07-26 DIAGNOSIS — Z66 Do not resuscitate: Secondary | ICD-10-CM

## 2017-07-26 DIAGNOSIS — F17210 Nicotine dependence, cigarettes, uncomplicated: Secondary | ICD-10-CM | POA: Insufficient documentation

## 2017-07-26 DIAGNOSIS — C3492 Malignant neoplasm of unspecified part of left bronchus or lung: Secondary | ICD-10-CM

## 2017-07-26 DIAGNOSIS — R109 Unspecified abdominal pain: Secondary | ICD-10-CM

## 2017-07-26 DIAGNOSIS — R569 Unspecified convulsions: Secondary | ICD-10-CM | POA: Insufficient documentation

## 2017-07-26 DIAGNOSIS — D649 Anemia, unspecified: Secondary | ICD-10-CM | POA: Diagnosis present

## 2017-07-26 DIAGNOSIS — J441 Chronic obstructive pulmonary disease with (acute) exacerbation: Secondary | ICD-10-CM | POA: Insufficient documentation

## 2017-07-26 DIAGNOSIS — D539 Nutritional anemia, unspecified: Secondary | ICD-10-CM

## 2017-07-26 DIAGNOSIS — E876 Hypokalemia: Secondary | ICD-10-CM

## 2017-07-26 DIAGNOSIS — Z7951 Long term (current) use of inhaled steroids: Secondary | ICD-10-CM

## 2017-07-26 DIAGNOSIS — F419 Anxiety disorder, unspecified: Secondary | ICD-10-CM

## 2017-07-26 DIAGNOSIS — I2699 Other pulmonary embolism without acute cor pulmonale: Secondary | ICD-10-CM | POA: Diagnosis present

## 2017-07-26 DIAGNOSIS — Z7952 Long term (current) use of systemic steroids: Secondary | ICD-10-CM | POA: Insufficient documentation

## 2017-07-26 DIAGNOSIS — I5032 Chronic diastolic (congestive) heart failure: Secondary | ICD-10-CM | POA: Insufficient documentation

## 2017-07-26 DIAGNOSIS — K219 Gastro-esophageal reflux disease without esophagitis: Secondary | ICD-10-CM

## 2017-07-26 DIAGNOSIS — T451X5A Adverse effect of antineoplastic and immunosuppressive drugs, initial encounter: Secondary | ICD-10-CM | POA: Insufficient documentation

## 2017-07-26 DIAGNOSIS — Z7901 Long term (current) use of anticoagulants: Secondary | ICD-10-CM | POA: Insufficient documentation

## 2017-07-26 DIAGNOSIS — Z9981 Dependence on supplemental oxygen: Secondary | ICD-10-CM | POA: Insufficient documentation

## 2017-07-26 DIAGNOSIS — R140 Abdominal distension (gaseous): Secondary | ICD-10-CM

## 2017-07-26 DIAGNOSIS — Z923 Personal history of irradiation: Secondary | ICD-10-CM

## 2017-07-26 NOTE — ED Triage Notes (Signed)
Pt comes to ed, via ems, c/o SOB chronic.  V/s on 158/88, pulse 108, spo2 100 4 liters.  cbg 117.  [facility], lung cancer, copd. Returning to ed.

## 2017-07-27 ENCOUNTER — Other Ambulatory Visit: Payer: Self-pay

## 2017-07-27 ENCOUNTER — Observation Stay (HOSPITAL_COMMUNITY): Payer: Medicaid Other

## 2017-07-27 ENCOUNTER — Emergency Department (HOSPITAL_COMMUNITY): Payer: Medicaid Other

## 2017-07-27 ENCOUNTER — Encounter (HOSPITAL_COMMUNITY): Payer: Self-pay | Admitting: Emergency Medicine

## 2017-07-27 DIAGNOSIS — N183 Chronic kidney disease, stage 3 (moderate): Secondary | ICD-10-CM | POA: Diagnosis not present

## 2017-07-27 DIAGNOSIS — I2699 Other pulmonary embolism without acute cor pulmonale: Secondary | ICD-10-CM | POA: Diagnosis not present

## 2017-07-27 DIAGNOSIS — D649 Anemia, unspecified: Secondary | ICD-10-CM | POA: Diagnosis not present

## 2017-07-27 DIAGNOSIS — J441 Chronic obstructive pulmonary disease with (acute) exacerbation: Secondary | ICD-10-CM

## 2017-07-27 LAB — CBC
HEMATOCRIT: 27.1 % — AB (ref 39.0–52.0)
HEMOGLOBIN: 9 g/dL — AB (ref 13.0–17.0)
MCH: 32.6 pg (ref 26.0–34.0)
MCHC: 33.2 g/dL (ref 30.0–36.0)
MCV: 98.2 fL (ref 78.0–100.0)
PLATELETS: 64 10*3/uL — AB (ref 150–400)
RBC: 2.76 MIL/uL — ABNORMAL LOW (ref 4.22–5.81)
RDW: 16.4 % — ABNORMAL HIGH (ref 11.5–15.5)
WBC: 6.3 10*3/uL (ref 4.0–10.5)

## 2017-07-27 LAB — BASIC METABOLIC PANEL
ANION GAP: 9 (ref 5–15)
BUN: 29 mg/dL — AB (ref 6–20)
CHLORIDE: 97 mmol/L — AB (ref 98–111)
CO2: 34 mmol/L — AB (ref 22–32)
Calcium: 9.1 mg/dL (ref 8.9–10.3)
Creatinine, Ser: 1.5 mg/dL — ABNORMAL HIGH (ref 0.61–1.24)
GFR calc Af Amer: 58 mL/min — ABNORMAL LOW (ref >60)
GFR calc non Af Amer: 50 mL/min — ABNORMAL LOW (ref >60)
GLUCOSE: 116 mg/dL — AB (ref 70–99)
Potassium: 3.7 mmol/L (ref 3.5–5.1)
Sodium: 140 mmol/L (ref 135–145)

## 2017-07-27 LAB — I-STAT TROPONIN, ED: Troponin i, poc: 0.02 ng/mL (ref 0.00–0.08)

## 2017-07-27 LAB — HEPATIC FUNCTION PANEL
ALT: 15 U/L (ref 0–44)
AST: 21 U/L (ref 15–41)
Albumin: 3.1 g/dL — ABNORMAL LOW (ref 3.5–5.0)
Alkaline Phosphatase: 89 U/L (ref 38–126)
BILIRUBIN TOTAL: 0.6 mg/dL (ref 0.3–1.2)
Bilirubin, Direct: 0.1 mg/dL (ref 0.0–0.2)
Indirect Bilirubin: 0.5 mg/dL (ref 0.3–0.9)
TOTAL PROTEIN: 6 g/dL — AB (ref 6.5–8.1)

## 2017-07-27 MED ORDER — METHYLPREDNISOLONE SODIUM SUCC 125 MG IJ SOLR: 80.0000 mg | Freq: Three times a day (TID) | INTRAMUSCULAR | Status: DC

## 2017-07-27 MED ORDER — METOPROLOL TARTRATE 25 MG PO TABS
12.5000 mg | ORAL_TABLET | Freq: Two times a day (BID) | ORAL | Status: DC
  Administered 2017-07-27 – 2017-07-28 (×3): 12.5 mg via ORAL
  Filled 2017-07-27 (×3): qty 1

## 2017-07-27 MED ORDER — ALBUTEROL (5 MG/ML) CONTINUOUS INHALATION SOLN
10.0000 mg/h | INHALATION_SOLUTION | RESPIRATORY_TRACT | Status: DC
  Administered 2017-07-27: 10 mg/h via RESPIRATORY_TRACT
  Filled 2017-07-27: qty 20

## 2017-07-27 MED ORDER — MAGNESIUM SULFATE 2 GM/50ML IV SOLN
2.0000 g | Freq: Once | INTRAVENOUS | Status: AC
  Administered 2017-07-27: 2 g via INTRAVENOUS
  Filled 2017-07-27: qty 50

## 2017-07-27 MED ORDER — OXYCODONE HCL 5 MG PO TABS
10.0000 mg | ORAL_TABLET | ORAL | Status: DC | PRN
  Administered 2017-07-27 – 2017-07-28 (×3): 10 mg via ORAL
  Filled 2017-07-27 (×3): qty 2

## 2017-07-27 MED ORDER — UMECLIDINIUM BROMIDE 62.5 MCG/INH IN AEPB
1.0000 | INHALATION_SPRAY | Freq: Every day | RESPIRATORY_TRACT | Status: DC
  Administered 2017-07-28: 1 via RESPIRATORY_TRACT
  Filled 2017-07-27: qty 7

## 2017-07-27 MED ORDER — ENSURE ENLIVE PO LIQD
237.0000 mL | Freq: Three times a day (TID) | ORAL | Status: DC
  Administered 2017-07-27 – 2017-07-28 (×2): 237 mL via ORAL

## 2017-07-27 MED ORDER — ORAL CARE MOUTH RINSE: 15.0000 mL | Freq: Two times a day (BID) | OROMUCOSAL | Status: DC

## 2017-07-27 MED ORDER — SODIUM CHLORIDE 0.9% FLUSH: 3.0000 mL | Freq: Two times a day (BID) | INTRAVENOUS | Status: DC

## 2017-07-27 MED ORDER — ALBUTEROL SULFATE (2.5 MG/3ML) 0.083% IN NEBU
2.5000 mg | INHALATION_SOLUTION | Freq: Four times a day (QID) | RESPIRATORY_TRACT | Status: DC
  Administered 2017-07-28 (×2): 2.5 mg via RESPIRATORY_TRACT
  Filled 2017-07-27: qty 3

## 2017-07-27 MED ORDER — FOLIC ACID 1 MG PO TABS
1.0000 mg | ORAL_TABLET | Freq: Every day | ORAL | Status: DC
  Administered 2017-07-27: 1 mg via ORAL
  Filled 2017-07-27 (×2): qty 1

## 2017-07-27 MED ORDER — ACETAMINOPHEN 650 MG RE SUPP: 650.0000 mg | Freq: Four times a day (QID) | RECTAL | Status: DC | PRN

## 2017-07-27 MED ORDER — SODIUM CHLORIDE 0.9% FLUSH: 10.0000 mL | INTRAVENOUS | Status: DC | PRN

## 2017-07-27 MED ORDER — ACETAMINOPHEN 325 MG PO TABS: 650.0000 mg | ORAL_TABLET | Freq: Four times a day (QID) | ORAL | Status: DC | PRN

## 2017-07-27 MED ORDER — ALBUTEROL SULFATE (2.5 MG/3ML) 0.083% IN NEBU: 2.5000 mg | INHALATION_SOLUTION | Freq: Four times a day (QID) | RESPIRATORY_TRACT | Status: DC | PRN

## 2017-07-27 MED ORDER — SUCRALFATE 1 GM/10ML PO SUSP
1.0000 g | Freq: Three times a day (TID) | ORAL | Status: DC
  Administered 2017-07-27 – 2017-07-28 (×4): 1 g via ORAL
  Filled 2017-07-27 (×4): qty 10

## 2017-07-27 MED ORDER — ESCITALOPRAM OXALATE 10 MG PO TABS
5.0000 mg | ORAL_TABLET | Freq: Every day | ORAL | Status: DC
  Administered 2017-07-27: 5 mg via ORAL
  Filled 2017-07-27: qty 1

## 2017-07-27 MED ORDER — LORATADINE 10 MG PO TABS
10.0000 mg | ORAL_TABLET | Freq: Every day | ORAL | Status: DC
  Filled 2017-07-27 (×2): qty 1

## 2017-07-27 MED ORDER — ALBUTEROL SULFATE (2.5 MG/3ML) 0.083% IN NEBU
5.0000 mg | INHALATION_SOLUTION | Freq: Once | RESPIRATORY_TRACT | Status: AC
  Administered 2017-07-27: 5 mg via RESPIRATORY_TRACT
  Filled 2017-07-27: qty 6

## 2017-07-27 MED ORDER — OXYCODONE HCL 5 MG PO TABS
10.0000 mg | ORAL_TABLET | ORAL | Status: DC | PRN
  Filled 2017-07-27: qty 2

## 2017-07-27 MED ORDER — LEVOFLOXACIN IN D5W 500 MG/100ML IV SOLN
500.0000 mg | INTRAVENOUS | Status: DC
  Administered 2017-07-27: 500 mg via INTRAVENOUS
  Filled 2017-07-27: qty 100

## 2017-07-27 MED ORDER — FLUTICASONE FUROATE-VILANTEROL 100-25 MCG/INH IN AEPB
1.0000 | INHALATION_SPRAY | Freq: Every day | RESPIRATORY_TRACT | Status: DC
  Administered 2017-07-28: 1 via RESPIRATORY_TRACT
  Filled 2017-07-27: qty 28

## 2017-07-27 MED ORDER — ALBUTEROL SULFATE (2.5 MG/3ML) 0.083% IN NEBU
2.5000 mg | INHALATION_SOLUTION | Freq: Four times a day (QID) | RESPIRATORY_TRACT | Status: DC
  Administered 2017-07-27 (×2): 2.5 mg via RESPIRATORY_TRACT
  Filled 2017-07-27 (×2): qty 3

## 2017-07-27 MED ORDER — PANTOPRAZOLE SODIUM 40 MG PO TBEC
40.0000 mg | DELAYED_RELEASE_TABLET | Freq: Two times a day (BID) | ORAL | Status: DC
  Administered 2017-07-27 – 2017-07-28 (×3): 40 mg via ORAL
  Filled 2017-07-27 (×3): qty 1

## 2017-07-27 MED ORDER — SODIUM CHLORIDE 0.9 % IV SOLN: 250.0000 mL | INTRAVENOUS | Status: DC | PRN

## 2017-07-27 MED ORDER — PREDNISONE 20 MG PO TABS
60.0000 mg | ORAL_TABLET | Freq: Once | ORAL | Status: AC
Start: 2017-07-27 — End: 2017-07-27
  Administered 2017-07-27: 60 mg via ORAL
  Filled 2017-07-27: qty 3

## 2017-07-27 MED ORDER — GABAPENTIN 100 MG PO CAPS
100.0000 mg | ORAL_CAPSULE | Freq: Three times a day (TID) | ORAL | Status: DC
  Administered 2017-07-27 (×3): 100 mg via ORAL
  Filled 2017-07-27 (×4): qty 1

## 2017-07-27 MED ORDER — HYDROXYZINE HCL 10 MG PO TABS
10.0000 mg | ORAL_TABLET | Freq: Three times a day (TID) | ORAL | Status: DC | PRN
  Administered 2017-07-28: 10 mg via ORAL
  Filled 2017-07-27: qty 1

## 2017-07-27 MED ORDER — GUAIFENESIN ER 600 MG PO TB12
1200.0000 mg | ORAL_TABLET | Freq: Two times a day (BID) | ORAL | Status: DC
  Administered 2017-07-27 (×2): 1200 mg via ORAL
  Filled 2017-07-27 (×3): qty 2

## 2017-07-27 MED ORDER — ONDANSETRON HCL 4 MG/2ML IJ SOLN
4.0000 mg | Freq: Four times a day (QID) | INTRAMUSCULAR | Status: DC | PRN
  Administered 2017-07-27 (×2): 4 mg via INTRAVENOUS
  Filled 2017-07-27 (×2): qty 2

## 2017-07-27 MED ORDER — APIXABAN 5 MG PO TABS
5.0000 mg | ORAL_TABLET | Freq: Two times a day (BID) | ORAL | Status: DC
  Administered 2017-07-27 – 2017-07-28 (×3): 5 mg via ORAL
  Filled 2017-07-27 (×4): qty 1

## 2017-07-27 MED ORDER — FLUTICASONE-UMECLIDIN-VILANT 100-62.5-25 MCG/INH IN AEPB: 1.0000 | INHALATION_SPRAY | Freq: Every day | RESPIRATORY_TRACT | Status: DC

## 2017-07-27 MED ORDER — FERROUS SULFATE 325 (65 FE) MG PO TABS
325.0000 mg | ORAL_TABLET | Freq: Two times a day (BID) | ORAL | Status: DC
  Administered 2017-07-27 – 2017-07-28 (×2): 325 mg via ORAL
  Filled 2017-07-27 (×2): qty 1

## 2017-07-27 MED ORDER — ADULT MULTIVITAMIN W/MINERALS CH
ORAL_TABLET | Freq: Every day | ORAL | Status: DC
  Filled 2017-07-27 (×2): qty 1

## 2017-07-27 MED ORDER — SENNOSIDES-DOCUSATE SODIUM 8.6-50 MG PO TABS
1.0000 | ORAL_TABLET | Freq: Two times a day (BID) | ORAL | Status: DC
Start: 2017-07-27 — End: 2017-07-28
  Administered 2017-07-27: 1 via ORAL
  Filled 2017-07-27 (×3): qty 1

## 2017-07-27 MED ORDER — FUROSEMIDE 40 MG PO TABS
40.0000 mg | ORAL_TABLET | Freq: Every day | ORAL | Status: DC
  Administered 2017-07-27: 40 mg via ORAL
  Filled 2017-07-27 (×2): qty 1

## 2017-07-27 MED ORDER — IPRATROPIUM-ALBUTEROL 0.5-2.5 (3) MG/3ML IN SOLN
3.0000 mL | RESPIRATORY_TRACT | Status: AC
Start: 2017-07-27 — End: 2017-07-27
  Administered 2017-07-27 (×2): 3 mL via RESPIRATORY_TRACT
  Filled 2017-07-27: qty 3

## 2017-07-27 MED ORDER — SODIUM CHLORIDE 0.9% FLUSH: 3.0000 mL | INTRAVENOUS | Status: DC | PRN

## 2017-07-27 MED ORDER — POLYETHYLENE GLYCOL 3350 17 G PO PACK
17.0000 g | PACK | Freq: Every day | ORAL | Status: DC
  Filled 2017-07-27 (×2): qty 1

## 2017-07-27 MED ORDER — IPRATROPIUM-ALBUTEROL 0.5-2.5 (3) MG/3ML IN SOLN
RESPIRATORY_TRACT | Status: AC
  Filled 2017-07-27: qty 3

## 2017-07-27 NOTE — ED Notes (Signed)
Pt refused to ambulate stating, "Im to short of breath to walk." RN and EDP notified.

## 2017-07-27 NOTE — Progress Notes (Signed)
[facility] OF CARE NOTE Patient: [name] KLK:[id number]   PCP: [name], MD DOB: 07-Dec-1960   DOA: 07/26/2017   DOS: 07/27/2017    Patient was admitted by my colleague Dr. Maudie Mercury earlier on 07/27/2017. I have reviewed the H&P as well as assessment and plan and agree with the same. Important changes in the plan are listed below.  Plan of care: Principal Problem:   COPD exacerbation ([place]) Active Problems:   Seizure ([place])   CKD (chronic kidney disease), stage III ([place])   Pulmonary embolism ([facility])   Anemia Steroids and antibiotic.  Do not feel that the patient actually has an active COPD exacerbation.  Author: Berle Mull, MD [facility] Pager: [phone] 07/27/2017 3:47 PM   If 7PM-7AM, please contact night-coverage at [email or web address], password [facility]

## 2017-07-27 NOTE — H&P (Addendum)
TRH H&P   Patient Demographics:    [name], is a 57 y.o. male  MRN: [id number]   DOB - 10/21/1960  Admit Date - 07/26/2017  Outpatient Primary MD for the patient is Stark Klein, Scherrie November, MD  Referring MD/NP/PA: Deno Etienne  Outpatient Specialists:  [facility]   Patient coming from: home  Chief Complaint  Patient presents with  . Shortness of Breath      HPI:    [name]  is a 57 y.o. male, stage 4 lung cancer, Copd on home o2, seizures, apparently c/o cough with yellow sputum and dysnpea since yesterday am.  Denies fever, chills, cp, palp, n/v, diarrhea, brbpr, black stool.  Pt presented to ED due to dyspnea.   In Ed, tx with albuterol neb, prednisone, magnesium without relief.   CXR IMPRESSION: No significant interval change compared to recent priors with right greater than left pleural effusion, emphysema and spiculated left suprahilar lung mass.  Wbc 6.3, Hgb 9.0, Plt 64 Na 140, K 3.7, Bun 29, Creatinine 1.5 Trop 0.02  Pt will be admitted for Copd exacerbation      Review of systems:    In addition to the HPI above,  No Fever-chills, No Headache, No changes with Vision or hearing, No problems swallowing food or Liquids, No Chest pain,  No Abdominal pain, No Nausea or Vommitting, Bowel movements are regular, No Blood in stool or Urine, No dysuria, No new skin rashes or bruises, No new joints pains-aches,  No new weakness, tingling, numbness in any extremity, No recent weight gain or loss, No polyuria, polydypsia or polyphagia, No significant Mental Stressors.  A full 10 point Review of Systems was done, except as stated above, all other Review of Systems were negative.   With Past History of the following :    Past Medical History:  Diagnosis Date  . Abdominal pain 06/04/2016  . Adenocarcinoma of left lung, stage 4 ([place]) 05/02/2016  .  Alcohol abuse   . Bronchitis due to tobacco use ([place])   . Cancer ([place])    Lung  . COPD (chronic obstructive pulmonary disease) ([place])    on home o2 2LNC   . Dehydration 06/04/2016  . Encounter for antineoplastic chemotherapy 05/02/2016  . Gastritis   . Goals of care, counseling/discussion 05/02/2016  . Hematemesis   . HTN (hypertension) 10/30/2016  . Recurrent upper respiratory infection (URI)   . Seizures ([place]) 05/2011   new onset  . Shortness of breath       Past Surgical History:  Procedure Laterality Date  . ESOPHAGOGASTRODUODENOSCOPY (EGD) WITH PROPOFOL N/A 12/17/2016   Procedure: ESOPHAGOGASTRODUODENOSCOPY (EGD) WITH PROPOFOL;  Surgeon: Ladene Artist, MD;  Location: [facility];  Service: Endoscopy;  Laterality: N/A;  . IR FLUORO GUIDE PORT INSERTION RIGHT  05/13/2016  . IR US GUIDE VASC ACCESS RIGHT  05/13/2016  . NO PAST SURGERIES  Social History:     Social History   Tobacco Use  . Smoking status: Current Some Day Smoker    Packs/day: 0.10    Years: 30.00    Pack years: 3.00    Types: Cigarettes  . Smokeless tobacco: Never Used  . Tobacco comment: 1 cig a week  Substance Use Topics  . Alcohol use: No    Frequency: Never     Lives - at home  Mobility - walks by self   Family History :     Family History  Problem Relation Age of Onset  . Cancer Father   . Diabetes Mellitus II Mother        Home Medications:   Prior to Admission medications   Medication Sig Start Date End Date Taking? Authorizing Provider  acetaminophen (TYLENOL) 500 MG tablet Take 1 tablet (500 mg total) by mouth 2 (two) times daily as needed for mild pain. 07/07/17   Florencia Reasons, MD  albuterol (PROVENTIL) (2.5 MG/3ML) 0.083% nebulizer solution 1 neb every 4-6 hours as needed for wheezing and shortness of breath 04/01/17   Parrett, Fonnie Mu, NP  apixaban (ELIQUIS) 5 MG TABS tablet Take 1 tablet (5 mg total) by mouth 2 (two) times daily. Patient taking differently: Take 10 mg by  mouth 2 (two) times daily.  07/08/17   Florencia Reasons, MD  azithromycin (ZITHROMAX) 250 MG tablet 1 tab po daily x 4 more days, zero refills 07/25/17   Donne Hazel, MD  Chlorphen-Phenyleph-ASA (ALKA-SELTZER PLUS COLD PO) Take 2 tablets by mouth at bedtime as needed (COUGH).    [provider]  dexamethasone (DECADRON) 4 MG tablet Take 4 mg by mouth 2 (two) times daily. The day before, the day of, and the day after chemotherapy every three weeks    [provider]  Dextromethorphan-guaiFENesin (TUSSIN DM) 10-100 MG/5ML liquid Take 5 mLs by mouth every 12 (twelve) hours.    [provider]  escitalopram (LEXAPRO) 5 MG tablet Take 1 tablet (5 mg total) by mouth at bedtime. 04/17/17   Rosita Fire, MD  feeding supplement, ENSURE ENLIVE, (ENSURE ENLIVE) LIQD Take 237 mLs by mouth 3 (three) times daily between meals. 03/19/17   Shelly Coss, MD  ferrous sulfate 325 (65 FE) MG tablet Take 1 tablet (325 mg total) by mouth 2 (two) times daily with a meal. 07/12/17   Lavina Hamman, MD  Fluticasone-Umeclidin-Vilant (TRELEGY ELLIPTA) 100-62.5-25 MCG/INH AEPB Inhale 1 puff into the lungs daily. 05/31/17   Aline August, MD  folic acid (FOLVITE) 1 MG tablet Take 1 tablet (1 mg total) by mouth daily. 07/13/17   Lavina Hamman, MD  furosemide (LASIX) 40 MG tablet Take 1 tablet (40 mg total) by mouth daily. 07/13/17   Lavina Hamman, MD  gabapentin (NEURONTIN) 100 MG capsule Take 1 capsule (100 mg total) by mouth 3 (three) times daily. 07/12/17   Lavina Hamman, MD  guaiFENesin (MUCINEX) 600 MG 12 hr tablet Take 1,200 mg by mouth 2 (two) times daily.     [provider]  hydrOXYzine (ATARAX/VISTARIL) 10 MG tablet Take 1 tablet (10 mg total) by mouth 3 (three) times daily as needed for anxiety. 07/25/17   Donne Hazel, MD  Ipratropium-Albuterol (COMBIVENT) 20-100 MCG/ACT AERS respimat Inhale 1 puff into the lungs every 6 (six) hours. 05/31/17   Aline August, MD  loratadine  (CLARITIN) 10 MG tablet Take 10 mg by mouth daily.    [provider]  metoprolol  tartrate (LOPRESSOR) 25 MG tablet Take 12.5 mg by mouth 2 (two) times daily.     [provider]  Multiple Vitamin (THERA-TABS PO) Take 1 tablet by mouth daily.    [provider]  ondansetron (ZOFRAN-ODT) 4 MG disintegrating tablet Take 4 mg by mouth every 8 (eight) hours as needed for nausea or vomiting.    [provider]  Oxycodone HCl 10 MG TABS Take 1 tablet (10 mg total) by mouth every 4 (four) hours as needed for up to 15 doses (pain/shortness of breath). 07/13/17   McDonald, Mia A, PA-C  pantoprazole (PROTONIX) 40 MG tablet Take 1 tablet (40 mg total) by mouth 2 (two) times daily. 12/18/16   Eugenie Filler, MD  predniSONE (DELTASONE) 10 MG tablet Take 1 tablet (10 mg total) by mouth daily. 07/25/17   Donne Hazel, MD  senna-docusate (SENOKOT-S) 8.6-50 MG tablet Take 2 tablets by mouth 2 (two) times daily. Patient taking differently: Take 1 tablet by mouth 2 (two) times daily.  07/12/17   Lavina Hamman, MD  sucralfate (CARAFATE) 1 GM/10ML suspension Take 10 mLs (1 g total) by mouth 4 (four) times daily -  with meals and at bedtime. 07/12/17   Lavina Hamman, MD     Allergies:    No Known Allergies   Physical Exam:   Vitals  Blood pressure (!) 125/100, pulse 75, temperature 97.7 F (36.5 C), temperature source Oral, resp. rate 18, SpO2 93 %.   1. General  lying in bed in NAD,    2. Normal affect and insight, Not Suicidal or Homicidal, Awake Alert, Oriented X 3.  3. No F.N deficits, ALL C.Nerves Intact, Strength 5/5 all 4 extremities, Sensation intact all 4 extremities, Plantars down going.  4. Ears and Eyes appear Normal, Conjunctivae clear, PERRLA. Moist Oral Mucosa.  5. Supple Neck, No JVD, No cervical lymphadenopathy appriciated, No Carotid Bruits.  6. Symmetrical Chest wall movement  Slight crackles right lung base, slightly tight, slight exp  wheezing  7. RRR, No Gallops, Rubs or Murmurs, No Parasternal Heave.  8. Positive Bowel Sounds, Abdomen Soft, No tenderness, No organomegaly appriciated,No rebound -guarding or rigidity.  9.  No Cyanosis, Normal Skin Turgor, No Skin Rash or Bruise.  10. Good muscle tone,  joints appear normal , no effusions, Normal ROM.  11. No Palpable Lymph Nodes in Neck or Axillae      Data Review:    CBC Recent Labs  Lab 07/23/17 2321 07/24/17 0409 07/24/17 1116 07/25/17 0359 07/27/17 0150  WBC 4.4 5.9 4.0 5.6 6.3  HGB 7.0* 3.7* 7.7* 7.9* 9.0*  HCT 21.3* 11.2* 23.7* 23.1* 27.1*  PLT 123* 125* 99* 89* 64*  MCV 98.6 98.2 96.0 97.1 98.2  MCH 32.4 32.5 31.2 33.2 32.6  MCHC 32.9 33.0 32.5 34.2 33.2  RDW 17.0* 17.3* 16.9* 17.0* 16.4*  LYMPHSABS 0.2*  --   --   --   --   MONOABS 0.2  --   --   --   --   EOSABS 0.0  --   --   --   --   BASOSABS 0.0  --   --   --   --    ------------------------------------------------------------------------------------------------------------------  Chemistries  Recent Labs  Lab 07/24/17 0147 07/24/17 0409 07/25/17 0359 07/27/17 0101  NA 138  --  140 140  K 3.5  --  3.8 3.7  CL 99  --  96* 97*  CO2 26  --  36*  34*  GLUCOSE 265*  --  139* 116*  BUN 25*  --  28* 29*  CREATININE 1.62*  --  1.75* 1.50*  CALCIUM 8.9  --  9.2 9.1  MG  --  1.9  --   --    ------------------------------------------------------------------------------------------------------------------ estimated creatinine clearance is 39.7 mL/min (A) (by C-G formula based on SCr of 1.5 mg/dL (H)). ------------------------------------------------------------------------------------------------------------------ No results for input(s): TSH, T4TOTAL, T3FREE, THYROIDAB in the last 72 hours.  Invalid input(s): FREET3  Coagulation profile Recent Labs  Lab 07/24/17 0147  INR 1.06    ------------------------------------------------------------------------------------------------------------------- No results for input(s): DDIMER in the last 72 hours. -------------------------------------------------------------------------------------------------------------------  Cardiac Enzymes No results for input(s): CKMB, TROPONINI, MYOGLOBIN in the last 168 hours.  Invalid input(s): CK ------------------------------------------------------------------------------------------------------------------    Component Value Date/Time   BNP 49.6 07/23/2017 2322     ---------------------------------------------------------------------------------------------------------------  Urinalysis    Component Value Date/Time   COLORURINE YELLOW 07/16/2017 [place] 07/16/2017 1614   LABSPEC 1.011 07/16/2017 1614   PHURINE 6.0 07/16/2017 1614   GLUCOSEU NEGATIVE 07/16/2017 1614   HGBUR NEGATIVE 07/16/2017 1614   BILIRUBINUR SMALL (A) 07/16/2017 1614   KETONESUR NEGATIVE 07/16/2017 1614   PROTEINUR NEGATIVE 07/16/2017 1614   UROBILINOGEN 0.2 05/29/2011 0122   NITRITE NEGATIVE 07/16/2017 1614   LEUKOCYTESUR NEGATIVE 07/16/2017 1614    ----------------------------------------------------------------------------------------------------------------   Imaging Results:    Dg Chest 2 View  Result Date: 07/27/2017 CLINICAL DATA:  Chronic shortness of breath EXAM: CHEST - 2 VIEW COMPARISON:  07/23/2017, 07/20/2017, 07/18/2017, CT chest 06/30/2017 FINDINGS: Right-sided central venous port tip overlies the cavoatrial region. There are small bilateral pleural effusions without significant change. Emphysema left greater than right. Spiculated opacity in the left suprahilar lung, no change. Normal heart size. No pneumothorax. IMPRESSION: No significant interval change compared to recent priors with right greater than left pleural effusion, emphysema and spiculated left suprahilar  lung mass. Electronically Signed   By: Donavan Foil M.D.   On: 07/27/2017 00:25       Assessment & Plan:    Principal Problem:   COPD exacerbation ([place]) Active Problems:   Seizure ([place])   CKD (chronic kidney disease), stage III ([facility])   Pulmonary embolism ([facility])   Anemia   Copd exacerbation Solumedrol 80mg  iv q8h levaquin 500mg  iv qday Trelegy 1puff qday Albuterol neb tid and q6h prn  PE Continue Eliquis pharmacy to dose  CHF (EF 55-60%) Cont lasix 40mg  po qday  CKD stage 3 Check cmp in am  Anemia Continue ferrous sulfate 325mg  po qday Consider transfusion if Hgb worsening  Check cbc in am  Anxiety Cont Lexapro 5mg  poq day  Stage 4 lung cancer F/u with Dr. Laureen Abrahams  Cont PPI, sucralfate   DVT Prophylaxis  Eliquis  AM Labs Ordered, also please review Full Orders  Family Communication: Admission, patients condition and plan of care including tests being ordered have been discussed with the patient  who indicate understanding and agree with the plan and Code Status.  Code Status DNR  Likely DC to  home  Condition GUARDED    Consults called: none  Admission status: observation   Time spent in minutes : 60    Jani Gravel M.D on 07/27/2017 at 6:40 AM  Between 7am to 7pm - Pager - [phone]  After 7pm go to [email or web address] - password [email or web address]  [facility] - Office  [phone]

## 2017-07-27 NOTE — ED Provider Notes (Signed)
[facility] DEPT Provider Note   CSN: [id number] Arrival date & time: 07/26/17  2350     History   Chief Complaint Chief Complaint  Patient presents with  . Shortness of Breath    HPI KARRON ALVIZO is a 57 y.o. male.  57 yo M with stage IV lung cancer and COPD with worsening shortness of breath.  The patient was just discharged from the hospital yesterday he felt that his breathing had gotten significantly worse.  Having subjective fevers and chills.  Tried breathing treatments at his nursing home with minimal improvement.  The history is provided by the patient.  Shortness of Breath  This is a new problem. The average episode lasts 2 days. The problem occurs continuously.The current episode started 2 days ago. The problem has been gradually worsening. Associated symptoms include a fever (subjective) and cough. Pertinent negatives include no headaches, no chest pain, no vomiting, no abdominal pain and no rash. He has tried nothing for the symptoms. The treatment provided no relief. He has had prior hospitalizations. He has had prior ED visits. He has had no prior ICU admissions. Associated medical issues include COPD, chronic lung disease and PE.    Past Medical History:  Diagnosis Date  . Abdominal pain 06/04/2016  . Adenocarcinoma of left lung, stage 4 ([place]) 05/02/2016  . Alcohol abuse   . Bronchitis due to tobacco use ([place])   . Cancer ([place])    Lung  . COPD (chronic obstructive pulmonary disease) ([place])   . Dehydration 06/04/2016  . Encounter for antineoplastic chemotherapy 05/02/2016  . Gastritis   . Goals of care, counseling/discussion 05/02/2016  . Hematemesis   . HTN (hypertension) 10/30/2016  . Recurrent upper respiratory infection (URI)   . Seizures ([place]) 05/2011   new onset  . Shortness of breath     Patient Active Problem List   Diagnosis Date Noted  . Anemia 07/24/2017  . Normocytic anemia 07/24/2017  . Chronic diastolic  (congestive) heart failure ([place]) 07/24/2017  . Recurrent pleural effusion on right 07/15/2017  . Acute on chronic respiratory failure ([place]) 07/11/2017  . History of pulmonary embolism 07/11/2017  . PE (pulmonary thromboembolism) ([place]) 06/30/2017  . Pulmonary embolism ([place]) 06/30/2017  . AKI (acute kidney injury) ([place]) 06/25/2017  . Dyspnea 06/21/2017  . Chronic combined systolic (congestive) and diastolic (congestive) heart failure ([place])   . Constipation 06/07/2017  . Pain   . Acute on chronic respiratory failure with hypoxia ([place]) 05/28/2017  . Chronic respiratory failure with hypoxia ([place]) 04/01/2017  . Leucocytosis 03/16/2017  . CKD (chronic kidney disease), stage III ([place]) 03/12/2017  . Malnutrition of moderate degree 03/10/2017  . COPD exacerbation ([place]) 03/09/2017  . Macrocytic anemia 02/07/2017  . Severe malnutrition ([place]) 01/03/2017  . DOE (dyspnea on exertion) 12/30/2016  . Nausea   . Hypervolemia   . HCAP (healthcare-associated pneumonia) 12/19/2016  . SOB (shortness of breath) 12/19/2016  . Erosive gastropathy 12/18/2016  . Gastritis 12/18/2016  . Hematemesis 12/16/2016  . Intractable nausea and vomiting 12/16/2016  . HTN (hypertension) 10/30/2016  . Chronic obstructive pulmonary disease ([place]) 09/02/2016  . COPD (chronic obstructive pulmonary disease) ([place]) 08/19/2016  . Dehydration 06/04/2016  . Abdominal pain 06/04/2016  . 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 Adenocarcinoma of left lung, stage 4 ([place]) 05/02/2016  . Goals of care, counseling/discussion 05/02/2016  . Encounter for antineoplastic chemotherapy 05/02/2016  . Tobacco abuse 05/30/2011  . Alcohol abuse 05/30/2011  . Seizure ([place]) 05/28/2011  Past Surgical History:  Procedure Laterality Date  . ESOPHAGOGASTRODUODENOSCOPY (EGD) WITH PROPOFOL N/A 12/17/2016   Procedure: ESOPHAGOGASTRODUODENOSCOPY (EGD) WITH PROPOFOL;  Surgeon: Ladene Artist, MD;  Location: [facility];  Service: Endoscopy;   Laterality: N/A;  . IR FLUORO GUIDE PORT INSERTION RIGHT  05/13/2016  . IR US GUIDE VASC ACCESS RIGHT  05/13/2016  . NO PAST SURGERIES          Home Medications    Prior to Admission medications   Medication Sig Start Date End Date Taking? Authorizing Provider  acetaminophen (TYLENOL) 500 MG tablet Take 1 tablet (500 mg total) by mouth 2 (two) times daily as needed for mild pain. 07/07/17   Florencia Reasons, MD  albuterol (PROVENTIL) (2.5 MG/3ML) 0.083% nebulizer solution 1 neb every 4-6 hours as needed for wheezing and shortness of breath 04/01/17   Parrett, Fonnie Mu, NP  apixaban (ELIQUIS) 5 MG TABS tablet Take 1 tablet (5 mg total) by mouth 2 (two) times daily. Patient taking differently: Take 10 mg by mouth 2 (two) times daily.  07/08/17   Florencia Reasons, MD  azithromycin (ZITHROMAX) 250 MG tablet 1 tab po daily x 4 more days, zero refills 07/25/17   Donne Hazel, MD  Chlorphen-Phenyleph-ASA (ALKA-SELTZER PLUS COLD PO) Take 2 tablets by mouth at bedtime as needed (COUGH).    [provider]  dexamethasone (DECADRON) 4 MG tablet Take 4 mg by mouth 2 (two) times daily. The day before, the day of, and the day after chemotherapy every three weeks    [provider]  Dextromethorphan-guaiFENesin (TUSSIN DM) 10-100 MG/5ML liquid Take 5 mLs by mouth every 12 (twelve) hours.    [provider]  escitalopram (LEXAPRO) 5 MG tablet Take 1 tablet (5 mg total) by mouth at bedtime. 04/17/17   Rosita Fire, MD  feeding supplement, ENSURE ENLIVE, (ENSURE ENLIVE) LIQD Take 237 mLs by mouth 3 (three) times daily between meals. 03/19/17   Shelly Coss, MD  ferrous sulfate 325 (65 FE) MG tablet Take 1 tablet (325 mg total) by mouth 2 (two) times daily with a meal. 07/12/17   Lavina Hamman, MD  Fluticasone-Umeclidin-Vilant (TRELEGY ELLIPTA) 100-62.5-25 MCG/INH AEPB Inhale 1 puff into the lungs daily. 05/31/17   Aline August, MD  folic acid (FOLVITE) 1 MG tablet Take 1 tablet (1 mg total)  by mouth daily. 07/13/17   Lavina Hamman, MD  furosemide (LASIX) 40 MG tablet Take 1 tablet (40 mg total) by mouth daily. 07/13/17   Lavina Hamman, MD  gabapentin (NEURONTIN) 100 MG capsule Take 1 capsule (100 mg total) by mouth 3 (three) times daily. 07/12/17   Lavina Hamman, MD  guaiFENesin (MUCINEX) 600 MG 12 hr tablet Take 1,200 mg by mouth 2 (two) times daily.     [provider]  hydrOXYzine (ATARAX/VISTARIL) 10 MG tablet Take 1 tablet (10 mg total) by mouth 3 (three) times daily as needed for anxiety. 07/25/17   Donne Hazel, MD  Ipratropium-Albuterol (COMBIVENT) 20-100 MCG/ACT AERS respimat Inhale 1 puff into the lungs every 6 (six) hours. 05/31/17   Aline August, MD  loratadine (CLARITIN) 10 MG tablet Take 10 mg by mouth daily.    [provider]  metoprolol tartrate (LOPRESSOR) 25 MG tablet Take 12.5 mg by mouth 2 (two) times daily.     [provider]  Multiple Vitamin (THERA-TABS PO) Take 1 tablet by mouth daily.    [provider]  ondansetron (ZOFRAN-ODT) 4 MG disintegrating tablet  Take 4 mg by mouth every 8 (eight) hours as needed for nausea or vomiting.    [provider]  Oxycodone HCl 10 MG TABS Take 1 tablet (10 mg total) by mouth every 4 (four) hours as needed for up to 15 doses (pain/shortness of breath). 07/13/17   McDonald, Mia A, PA-C  pantoprazole (PROTONIX) 40 MG tablet Take 1 tablet (40 mg total) by mouth 2 (two) times daily. 12/18/16   Eugenie Filler, MD  predniSONE (DELTASONE) 10 MG tablet Take 1 tablet (10 mg total) by mouth daily. 07/25/17   Donne Hazel, MD  senna-docusate (SENOKOT-S) 8.6-50 MG tablet Take 2 tablets by mouth 2 (two) times daily. Patient taking differently: Take 1 tablet by mouth 2 (two) times daily.  07/12/17   Lavina Hamman, MD  sucralfate (CARAFATE) 1 GM/10ML suspension Take 10 mLs (1 g total) by mouth 4 (four) times daily -  with meals and at bedtime. 07/12/17   Lavina Hamman, MD    Family  History Family History  Problem Relation Age of Onset  . Cancer Father   . Diabetes Mellitus II Mother     Social History Social History   Tobacco Use  . Smoking status: Current Some Day Smoker    Packs/day: 0.10    Years: 30.00    Pack years: 3.00    Types: Cigarettes  . Smokeless tobacco: Never Used  . Tobacco comment: 1 cig a week  Substance Use Topics  . Alcohol use: No    Frequency: Never  . Drug use: No     Allergies   Patient has no known allergies.   Review of Systems Review of Systems  Constitutional: Positive for fever (subjective). Negative for chills.  HENT: Negative for congestion and facial swelling.   Eyes: Negative for discharge and visual disturbance.  Respiratory: Positive for cough and shortness of breath.   Cardiovascular: Negative for chest pain and palpitations.  Gastrointestinal: Negative for abdominal pain, diarrhea and vomiting.  Musculoskeletal: Negative for arthralgias and myalgias.  Skin: Negative for color change and rash.  Neurological: Negative for tremors, syncope and headaches.  Psychiatric/Behavioral: Negative for confusion and dysphoric mood.     Physical Exam Updated Vital Signs BP (!) 125/100 (BP Location: Right Arm)   Pulse 75   Temp 97.7 F (36.5 C) (Oral)   Resp 18   SpO2 93%   Physical Exam  Constitutional: He is oriented to person, place, and time. He appears well-developed and well-nourished.  HENT:  Head: Normocephalic and atraumatic.  Eyes: Pupils are equal, round, and reactive to light. EOM are normal.  Neck: Normal range of motion. Neck supple. No JVD present.  Cardiovascular: Normal rate and regular rhythm. Exam reveals no gallop and no friction rub.  No murmur heard. Pulmonary/Chest: No respiratory distress. He has wheezes ( Diffuse with prolonged expiration). He has rhonchi ( Scattered).  Abdominal: He exhibits no distension. There is no rebound and no guarding.  Musculoskeletal: Normal range of motion.    Neurological: He is alert and oriented to person, place, and time.  Skin: No rash noted. No pallor.  Psychiatric: He has a normal mood and affect. His behavior is normal.  Nursing note and vitals reviewed.    ED Treatments / Results  Labs (all labs ordered are listed, but only abnormal results are displayed) Labs Reviewed  BASIC METABOLIC PANEL - Abnormal; Notable for the following components:      Result Value   Chloride 97 (*)  CO2 34 (*)    Glucose, Bld 116 (*)    BUN 29 (*)    Creatinine, Ser 1.50 (*)    GFR calc non Af Amer 50 (*)    GFR calc Af Amer 58 (*)    All other components within normal limits  CBC - Abnormal; Notable for the following components:   RBC 2.76 (*)    Hemoglobin 9.0 (*)    HCT 27.1 (*)    RDW 16.4 (*)    Platelets 64 (*)    All other components within normal limits  CBC WITH DIFFERENTIAL/PLATELET  I-STAT TROPONIN, ED    EKG EKG Interpretation  Date/Time:  Sunday July 27 2017 00:01:54 EDT Ventricular Rate:  101 PR Interval:    QRS Duration: 88 QT Interval:  379 QTC Calculation: 492 R Axis:   115 Text Interpretation:  Sinus tachycardia Right atrial enlargement Probable lateral infarct, old Baseline wander TECHNICALLY DIFFICULT Otherwise no significant change Confirmed by Zoila Ditullio ([id number]) on 07/27/2017 12:25:48 AM   Radiology Dg Chest 2 View  Result Date: 07/27/2017 CLINICAL DATA:  Chronic shortness of breath EXAM: CHEST - 2 VIEW COMPARISON:  07/23/2017, 07/20/2017, 07/18/2017, CT chest 06/30/2017 FINDINGS: Right-sided central venous port tip overlies the cavoatrial region. There are small bilateral pleural effusions without significant change. Emphysema left greater than right. Spiculated opacity in the left suprahilar lung, no change. Normal heart size. No pneumothorax. IMPRESSION: No significant interval change compared to recent priors with right greater than left pleural effusion, emphysema and spiculated left suprahilar lung mass.  Electronically Signed   By: Kim  Fujinaga M.D.   On: 07/27/2017 00:25    Procedures Procedures (including critical care time)  Medications Ordered in ED Medications  ipratropium-albuterol (DUONEB) 0.5-2.5 (3) MG/3ML nebulizer solution 3 mL (3 mLs Nebulization Given 07/27/17 0229)  ipratropium-albuterol (DUONEB) 0.5-2.5 (3) MG/3ML nebulizer solution (0 mLs  Hold 07/27/17 0112)  albuterol (PROVENTIL,VENTOLIN) solution continuous neb (has no administration in time range)  albuterol (PROVENTIL) (2.5 MG/3ML) 0.083% nebulizer solution 5 mg (5 mg Nebulization Given 07/27/17 0007)  predniSONE (DELTASONE) tablet 60 mg (60 mg Oral Given 07/27/17 0150)  magnesium sulfate IVPB 2 g 50 mL (0 g Intravenous Stopped 07/27/17 0230)     Initial Impression / Assessment and Plan / ED Course  I have reviewed the triage vital signs and the nursing notes.  Pertinent labs & imaging results that were available during my care of the patient were reviewed by me and considered in my medical decision making (see chart for details).     56  yo M with a chief complaint of shortness of breath.  This is a chronic issue for him.  Was just in the hospital and discharged yesterday.  The patient has had worsening shortness of breath since.  He is tachypneic on my initial exam.  Diffuse wheezes.  We will give 3 duo nebs back-to-back and reassess.  Patient with significant improvement of aeration.  He was actually taken off of oxygen for period of time and did not require it.  He however stated that he was continued to have significant trouble breathing.  Upon trying to ambulate him just the effort to try and get him up he became hypoxic into the 70s requiring him to be placed on a higher than normal amount of oxygen for him.  At this point I will discuss the case with the hospitalist.  Will start on a continuous neb.  CRITICAL CARE Performed by: Cecilio Asper  Total critical care time: 35 minutes  Critical care time was  exclusive of separately billable procedures and treating other patients.  Critical care was necessary to treat or prevent imminent or life-threatening deterioration.  Critical care was time spent personally by me on the following activities: development of treatment plan with patient and/or surrogate as well as nursing, discussions with consultants, evaluation of patient's response to treatment, examination of patient, obtaining history from patient or surrogate, ordering and performing treatments and interventions, ordering and review of laboratory studies, ordering and review of radiographic studies, pulse oximetry and re-evaluation of patient's condition.  The patients results and plan were reviewed and discussed.   Any x-rays performed were independently reviewed by myself.   Differential diagnosis were considered with the presenting HPI.  Medications  ipratropium-albuterol (DUONEB) 0.5-2.5 (3) MG/3ML nebulizer solution 3 mL (3 mLs Nebulization Given 07/27/17 0229)  ipratropium-albuterol (DUONEB) 0.5-2.5 (3) MG/3ML nebulizer solution (0 mLs  Hold 07/27/17 0112)  albuterol (PROVENTIL,VENTOLIN) solution continuous neb (has no administration in time range)  albuterol (PROVENTIL) (2.5 MG/3ML) 0.083% nebulizer solution 5 mg (5 mg Nebulization Given 07/27/17 0007)  predniSONE (DELTASONE) tablet 60 mg (60 mg Oral Given 07/27/17 0150)  magnesium sulfate IVPB 2 g 50 mL (0 g Intravenous Stopped 07/27/17 0230)    Vitals:   07/26/17 2355 07/27/17 0002 07/27/17 0130 07/27/17 0505  BP:  (!) 150/98 (!) 170/94 (!) 125/100  Pulse:  (!) 101 (!) 102 75  Resp:  18 (!) 21 18  Temp:  97.7 F (36.5 C)    TempSrc:  Oral    SpO2: 95% 100% 100% 93%    Final diagnoses:  COPD exacerbation ([place])    Admission/ observation were discussed with the admitting physician, patient and/or family and they are comfortable with the plan.    Final Clinical Impressions(s) / ED Diagnoses   Final diagnoses:  COPD exacerbation  [place])    ED Discharge Orders    None       Deno Etienne, DO 07/27/17 [phone]

## 2017-07-27 NOTE — ED Notes (Signed)
ED TO INPATIENT HANDOFF REPORT  Name/Age/Gender [name] 57 y.o. male  Code Status    Code Status Orders  (From admission, onward)        Start     Ordered   07/27/17 0701  Do not attempt resuscitation (DNR)  Continuous    Question Answer Comment  In the event of cardiac or respiratory ARREST Do not call a "code blue"   In the event of cardiac or respiratory ARREST Do not perform Intubation, CPR, defibrillation or ACLS   In the event of cardiac or respiratory ARREST Use medication by any route, position, wound care, and other measures to relive pain and suffering. May use oxygen, suction and manual treatment of airway obstruction as needed for comfort.      07/27/17 0701    Code Status History    Date Active Date Inactive Code Status Order ID Comments User Context   07/24/2017 0150 07/25/2017 1703 DNR [id number]  Ivor Costa, MD ED   07/11/2017 0304 07/13/2017 0224 DNR [id number]  Norval Morton, MD ED   07/04/2017 2356 07/09/2017 2151 DNR [id number]  Etta Quill, DO ED   06/30/2017 1015 07/04/2017 0215 DNR [id number]  Shelly Coss, MD ED   06/25/2017 1117 06/29/2017 1603 Full Code [id number]  Shelly Coss, MD ED   06/21/2017 1044 06/23/2017 2044 DNR [id number]  Mariel Aloe, MD ED   06/05/2017 1602 06/10/2017 2253 Full Code [id number]  Cristy Folks, MD Inpatient   05/28/2017 1147 05/31/2017 2012 DNR [id number]  Nita Sells, MD ED   04/10/2017 1148 04/17/2017 1837 Full Code [id number]  Damita Lack, MD ED   03/09/2017 2359 03/19/2017 1931 Full Code [id number]  Cristy Folks, MD Inpatient   02/14/2017 0902 02/16/2017 1455 Full Code [id number]  Florencia Reasons, MD Inpatient   02/10/2017 0031 02/14/2017 0902 DNR [id number]  Norval Morton, MD ED   12/30/2016 2325 01/07/2017 2109 DNR [id number]  Elodia [name]., MD Inpatient   12/17/2016 0934 12/24/2016 1811 DNR [id number]  Thurnell Lose, MD Inpatient   12/16/2016 1649 12/17/2016 0934 Full Code [id number]  Charlynne Cousins,  MD Inpatient      Home/SNF/Other Nursing Home  Chief Complaint shortness of breath  Level of Care/Admitting Diagnosis ED Disposition    ED Disposition Condition [place]: [facility] [294765]  Level of Care: Telemetry [5]  Admit to tele based on following criteria: Other see comments  Comments: monitor for ischemia  Diagnosis: COPD exacerbation [facility]) [465035]  Admitting Physician: Jani Gravel [[id number]]  Attending Physician: Jani Gravel [[id number]]  PT Class (Do Not Modify): Observation [104]  PT Acc Code (Do Not Modify): Observation [10022]       Medical History Past Medical History:  Diagnosis Date  . Abdominal pain 06/04/2016  . Adenocarcinoma of left lung, stage 4 ([place]) 05/02/2016  . Alcohol abuse   . Bronchitis due to tobacco use ([place])   . Cancer ([place])    Lung  . COPD (chronic obstructive pulmonary disease) ([place])    on home o2 2LNC   . Dehydration 06/04/2016  . Encounter for antineoplastic chemotherapy 05/02/2016  . Gastritis   . Goals of care, counseling/discussion 05/02/2016  . Hematemesis   . HTN (hypertension) 10/30/2016  . Recurrent upper respiratory infection (URI)   . Seizures ([place]) 05/2011   new onset  . Shortness of breath     Allergies No Known Allergies  IV Location/Drains/Wounds Patient Lines/Drains/Airways Status  Active Line/Drains/Airways    Name:   Placement date:   Placement time:   Site:   Days:   Implanted Port 05/13/16 Right Chest   05/13/16    -    Chest   440          Labs/Imaging Results for orders placed or performed during the hospital encounter of 07/26/17 (from the past 48 hour(s))  Basic metabolic panel     Status: Abnormal   Collection Time: 07/27/17  1:01 AM  Result Value Ref Range   Sodium 140 135 - 145 mmol/L   Potassium 3.7 3.5 - 5.1 mmol/L   Chloride 97 (L) 98 - 111 mmol/L    Comment: Please note change in reference range.   CO2 34 (H) 22 - 32 mmol/L   Glucose, Bld 116 (H) 70 -  99 mg/dL    Comment: Please note change in reference range.   [name] 29 (H) 6 - 20 mg/dL    Comment: Please note change in reference range.   Creatinine, Ser 1.50 (H) 0.61 - 1.24 mg/dL   Calcium 9.1 8.9 - 10.3 mg/dL   GFR calc non Af Amer 50 (L) >60 mL/min   GFR calc Af Amer 58 (L) >60 mL/min    Comment: (NOTE) The eGFR has been calculated using the CKD EPI equation. This calculation has not been validated in all clinical situations. eGFR's persistently <60 mL/min signify possible Chronic Kidney Disease.    Anion gap 9 5 - 15    Comment: Performed at [facility], [place] [address], [place], [place] [address]  I-stat troponin, ED     Status: None   Collection Time: 07/27/17  1:06 AM  Result Value Ref Range   Troponin i, poc 0.02 0.00 - 0.08 ng/mL   Comment 3            Comment: Due to the release kinetics of cTnI, a negative result within the first hours of the onset of symptoms does not rule out myocardial infarction with certainty. If myocardial infarction is still suspected, repeat the test at appropriate intervals.   CBC     Status: Abnormal   Collection Time: 07/27/17  1:50 AM  Result Value Ref Range   WBC 6.3 4.0 - 10.5 K/uL   RBC 2.76 (L) 4.22 - 5.81 MIL/uL   Hemoglobin 9.0 (L) 13.0 - 17.0 g/dL   HCT 27.1 (L) 39.0 - 52.0 %   MCV 98.2 78.0 - 100.0 fL   MCH 32.6 26.0 - 34.0 pg   MCHC 33.2 30.0 - 36.0 g/dL   RDW 16.4 (H) 11.5 - 15.5 %   Platelets 64 (L) 150 - 400 K/uL    Comment: SPECIMEN CHECKED FOR CLOTS REPEATED TO VERIFY PLATELET COUNT CONFIRMED BY SMEAR Performed at [facility] [address], [place], [place] [address]    Dg Chest 2 View  Result Date: 07/27/2017 CLINICAL DATA:  Chronic shortness of breath EXAM: CHEST - 2 VIEW COMPARISON:  07/23/2017, 07/20/2017, 07/18/2017, CT chest 06/30/2017 FINDINGS: Right-sided central venous port tip overlies the cavoatrial region. There are small bilateral pleural effusions without  significant change. Emphysema left greater than right. Spiculated opacity in the left suprahilar lung, no change. Normal heart size. No pneumothorax. IMPRESSION: No significant interval change compared to recent priors with right greater than left pleural effusion, emphysema and spiculated left suprahilar lung mass. Electronically Signed   By: Donavan Foil M.D.   On: 07/27/2017 00:25  Pending Labs Unresulted Labs (From admission, onward)   Start     Ordered   07/28/17 0500  Comprehensive metabolic panel  Tomorrow morning,   R     07/27/17 0701   07/28/17 0500  CBC  Tomorrow morning,   R     07/27/17 0701   07/27/17 0101  CBC with Differential  STAT,   STAT     07/27/17 0100      Vitals/Pain Today's Vitals   07/27/17 0505 07/27/17 0647 07/27/17 0700 07/27/17 0707  BP: (!) 125/100  (!) 150/89 (!) 150/89  Pulse: 75  79 74  Resp: 18   18  Temp:    97.7 F (36.5 C)  TempSrc:    Oral  SpO2: 93% 98% 100% 100%  PainSc:        Isolation Precautions No active isolations  Medications Medications  ipratropium-albuterol (DUONEB) 0.5-2.5 (3) MG/3ML nebulizer solution 3 mL (3 mLs Nebulization Not Given 07/27/17 0654)  ipratropium-albuterol (DUONEB) 0.5-2.5 (3) MG/3ML nebulizer solution (0 mLs  Hold 07/27/17 0112)  albuterol (PROVENTIL,VENTOLIN) solution continuous neb (10 mg/hr Nebulization New Bag/Given 07/27/17 0646)  methylPREDNISolone sodium succinate (SOLU-MEDROL) 125 mg/2 mL injection 80 mg (has no administration in time range)  levofloxacin (LEVAQUIN) IVPB 500 mg (500 mg Intravenous New Bag/Given 07/27/17 0708)  apixaban (ELIQUIS) tablet 5 mg (has no administration in time range)  albuterol (PROVENTIL) (2.5 MG/3ML) 0.083% nebulizer solution 2.5 mg (has no administration in time range)  albuterol (PROVENTIL) (2.5 MG/3ML) 0.083% nebulizer solution 2.5 mg (has no administration in time range)  sodium chloride flush (NS) 0.9 % injection 3 mL (has no administration in time range)  sodium  chloride flush (NS) 0.9 % injection 3 mL (has no administration in time range)  0.9 %  sodium chloride infusion (has no administration in time range)  acetaminophen (TYLENOL) tablet 650 mg (has no administration in time range)    Or  acetaminophen (TYLENOL) suppository 650 mg (has no administration in time range)  albuterol (PROVENTIL) (2.5 MG/3ML) 0.083% nebulizer solution 5 mg (5 mg Nebulization Given 07/27/17 0007)  predniSONE (DELTASONE) tablet 60 mg (60 mg Oral Given 07/27/17 0150)  magnesium sulfate IVPB 2 g 50 mL (0 g Intravenous Stopped 07/27/17 0230)    Mobility manual wheelchair

## 2017-07-27 NOTE — ED Notes (Signed)
He is in no distress and continues to receive his C.A.T. Neb. Tx. Although he is in no distress, he tells me he does "not feel much better yet". I am awaiting imminent call from the floor nurse for report.

## 2017-07-28 ENCOUNTER — Emergency Department (HOSPITAL_COMMUNITY)
Admission: EM | Admit: 2017-07-28 | Discharge: 2017-07-29 | Disposition: A | Discharge status: Skilled Nursing Facility | Payer: Medicaid Other | Source: Home / Self Care | Attending: Emergency Medicine | Admitting: Emergency Medicine

## 2017-07-28 ENCOUNTER — Other Ambulatory Visit: Payer: Self-pay

## 2017-07-28 ENCOUNTER — Observation Stay (HOSPITAL_COMMUNITY): Payer: Medicaid Other

## 2017-07-28 ENCOUNTER — Encounter (HOSPITAL_COMMUNITY): Payer: Self-pay | Admitting: *Deleted

## 2017-07-28 DIAGNOSIS — Z85118 Personal history of other malignant neoplasm of bronchus and lung: Secondary | ICD-10-CM

## 2017-07-28 DIAGNOSIS — R140 Abdominal distension (gaseous): Secondary | ICD-10-CM

## 2017-07-28 DIAGNOSIS — I5042 Chronic combined systolic (congestive) and diastolic (congestive) heart failure: Secondary | ICD-10-CM

## 2017-07-28 DIAGNOSIS — F17210 Nicotine dependence, cigarettes, uncomplicated: Secondary | ICD-10-CM | POA: Insufficient documentation

## 2017-07-28 DIAGNOSIS — J441 Chronic obstructive pulmonary disease with (acute) exacerbation: Secondary | ICD-10-CM | POA: Diagnosis not present

## 2017-07-28 DIAGNOSIS — N183 Chronic kidney disease, stage 3 (moderate): Secondary | ICD-10-CM | POA: Insufficient documentation

## 2017-07-28 DIAGNOSIS — I130 Hypertensive heart and chronic kidney disease with heart failure and stage 1 through stage 4 chronic kidney disease, or unspecified chronic kidney disease: Secondary | ICD-10-CM | POA: Insufficient documentation

## 2017-07-28 DIAGNOSIS — Z79899 Other long term (current) drug therapy: Secondary | ICD-10-CM

## 2017-07-28 DIAGNOSIS — J449 Chronic obstructive pulmonary disease, unspecified: Secondary | ICD-10-CM | POA: Insufficient documentation

## 2017-07-28 DIAGNOSIS — R0602 Shortness of breath: Secondary | ICD-10-CM

## 2017-07-28 DIAGNOSIS — M79652 Pain in left thigh: Secondary | ICD-10-CM | POA: Insufficient documentation

## 2017-07-28 DIAGNOSIS — C7951 Secondary malignant neoplasm of bone: Secondary | ICD-10-CM

## 2017-07-28 DIAGNOSIS — R600 Localized edema: Secondary | ICD-10-CM | POA: Insufficient documentation

## 2017-07-28 LAB — COMPREHENSIVE METABOLIC PANEL
ALBUMIN: 3 g/dL — AB (ref 3.5–5.0)
ALT: 14 U/L (ref 0–44)
ANION GAP: 9 (ref 5–15)
AST: 19 U/L (ref 15–41)
Alkaline Phosphatase: 81 U/L (ref 38–126)
BUN: 22 mg/dL — ABNORMAL HIGH (ref 6–20)
CHLORIDE: 95 mmol/L — AB (ref 98–111)
CO2: 40 mmol/L — AB (ref 22–32)
Calcium: 8.8 mg/dL — ABNORMAL LOW (ref 8.9–10.3)
Creatinine, Ser: 1.47 mg/dL — ABNORMAL HIGH (ref 0.61–1.24)
GFR calc non Af Amer: 51 mL/min — ABNORMAL LOW (ref >60)
GFR, EST AFRICAN AMERICAN: 59 mL/min — AB (ref >60)
Glucose, Bld: 113 mg/dL — ABNORMAL HIGH (ref 70–99)
Potassium: 2.9 mmol/L — ABNORMAL LOW (ref 3.5–5.1)
SODIUM: 144 mmol/L (ref 135–145)
Total Bilirubin: 0.4 mg/dL (ref 0.3–1.2)
Total Protein: 5.7 g/dL — ABNORMAL LOW (ref 6.5–8.1)

## 2017-07-28 LAB — CBC
HEMATOCRIT: 24 % — AB (ref 39.0–52.0)
HEMATOCRIT: 24.5 % — AB (ref 39.0–52.0)
HEMOGLOBIN: 7.9 g/dL — AB (ref 13.0–17.0)
Hemoglobin: 7.8 g/dL — ABNORMAL LOW (ref 13.0–17.0)
MCH: 32.6 pg (ref 26.0–34.0)
MCH: 31.7 pg (ref 26.0–34.0)
MCHC: 32.9 g/dL (ref 30.0–36.0)
MCHC: 31.8 g/dL (ref 30.0–36.0)
MCV: 99.2 fL (ref 78.0–100.0)
MCV: 99.6 fL (ref 78.0–100.0)
Platelets: 44 10*3/uL — ABNORMAL LOW (ref 150–400)
Platelets: 36 10*3/uL — ABNORMAL LOW (ref 150–400)
RBC: 2.42 MIL/uL — AB (ref 4.22–5.81)
RBC: 2.46 MIL/uL — ABNORMAL LOW (ref 4.22–5.81)
RDW: 16.2 % — AB (ref 11.5–15.5)
RDW: 16.3 % — AB (ref 11.5–15.5)
WBC: 5.6 10*3/uL (ref 4.0–10.5)
WBC: 6.3 10*3/uL (ref 4.0–10.5)

## 2017-07-28 LAB — BASIC METABOLIC PANEL
ANION GAP: 8 (ref 5–15)
Anion gap: 9 (ref 5–15)
BUN: 21 mg/dL — AB (ref 6–20)
BUN: 20 mg/dL (ref 6–20)
CALCIUM: 8.8 mg/dL — AB (ref 8.9–10.3)
CALCIUM: 9 mg/dL (ref 8.9–10.3)
CO2: 38 mmol/L — AB (ref 22–32)
CO2: 39 mmol/L — ABNORMAL HIGH (ref 22–32)
CREATININE: 1.44 mg/dL — AB (ref 0.61–1.24)
Chloride: 97 mmol/L — ABNORMAL LOW (ref 98–111)
Chloride: 98 mmol/L (ref 98–111)
Creatinine, Ser: 1.56 mg/dL — ABNORMAL HIGH (ref 0.61–1.24)
GFR calc Af Amer: >60 mL/min (ref >60)
GFR calc non Af Amer: 53 mL/min — ABNORMAL LOW (ref >60)
GFR, EST AFRICAN AMERICAN: 55 mL/min — AB (ref >60)
GFR, EST NON AFRICAN AMERICAN: 48 mL/min — AB (ref >60)
Glucose, Bld: 98 mg/dL (ref 70–99)
Glucose, Bld: 103 mg/dL — ABNORMAL HIGH (ref 70–99)
POTASSIUM: 3.8 mmol/L (ref 3.5–5.1)
Potassium: 3 mmol/L — ABNORMAL LOW (ref 3.5–5.1)
Sodium: 144 mmol/L (ref 135–145)
Sodium: 145 mmol/L (ref 135–145)

## 2017-07-28 LAB — I-STAT TROPONIN, ED: TROPONIN I, POC: 0.01 ng/mL (ref 0.00–0.08)

## 2017-07-28 LAB — BRAIN NATRIURETIC PEPTIDE: B NATRIURETIC PEPTIDE 5: 41.4 pg/mL (ref 0.0–100.0)

## 2017-07-28 MED ORDER — PREDNISONE 20 MG PO TABS
60.0000 mg | ORAL_TABLET | Freq: Once | ORAL | Status: AC
  Administered 2017-07-28: 60 mg via ORAL
  Filled 2017-07-28: qty 3

## 2017-07-28 MED ORDER — POTASSIUM CHLORIDE CRYS ER 20 MEQ PO TBCR
40.0000 meq | EXTENDED_RELEASE_TABLET | Freq: Once | ORAL | Status: AC
  Administered 2017-07-28: 40 meq via ORAL
  Filled 2017-07-28: qty 2

## 2017-07-28 MED ORDER — POTASSIUM CHLORIDE ER 10 MEQ PO TBCR: 20.0000 meq | EXTENDED_RELEASE_TABLET | Freq: Every day | ORAL | 0 refills | Status: AC

## 2017-07-28 MED ORDER — VITAMIN B-12 1000 MCG PO TABS: 1000.0000 ug | ORAL_TABLET | Freq: Every day | ORAL | 0 refills | Status: DC

## 2017-07-28 MED ORDER — HYDROCODONE-ACETAMINOPHEN 5-325 MG PO TABS
1.0000 | ORAL_TABLET | Freq: Once | ORAL | Status: AC
  Administered 2017-07-28: 1 via ORAL
  Filled 2017-07-28: qty 1

## 2017-07-28 MED ORDER — POLYETHYLENE GLYCOL 3350 17 G PO PACK: 17.0000 g | PACK | Freq: Every day | ORAL | 0 refills | Status: AC

## 2017-07-28 MED ORDER — APIXABAN 5 MG PO TABS: 5.0000 mg | ORAL_TABLET | Freq: Two times a day (BID) | ORAL | 0 refills | Status: DC

## 2017-07-28 MED ORDER — FUROSEMIDE 10 MG/ML IJ SOLN
40.0000 mg | Freq: Once | INTRAMUSCULAR | Status: AC
  Administered 2017-07-28: 40 mg via INTRAVENOUS
  Filled 2017-07-28: qty 4

## 2017-07-28 MED ORDER — IPRATROPIUM-ALBUTEROL 0.5-2.5 (3) MG/3ML IN SOLN: 3.0000 mL | Freq: Four times a day (QID) | RESPIRATORY_TRACT | 0 refills | Status: DC

## 2017-07-28 MED ORDER — POTASSIUM CHLORIDE CRYS ER 20 MEQ PO TBCR
40.0000 meq | EXTENDED_RELEASE_TABLET | ORAL | Status: AC
  Administered 2017-07-28 (×2): 40 meq via ORAL
  Filled 2017-07-28 (×2): qty 2

## 2017-07-28 MED ORDER — HEPARIN SOD (PORK) LOCK FLUSH 100 UNIT/ML IV SOLN
500.0000 [IU] | INTRAVENOUS | Status: AC | PRN
  Administered 2017-07-28: 500 [IU]

## 2017-07-28 MED ORDER — HEPARIN SOD (PORK) LOCK FLUSH 100 UNIT/ML IV SOLN
500.0000 [IU] | Freq: Once | INTRAVENOUS | Status: AC
  Administered 2017-07-28: 500 [IU]
  Filled 2017-07-28: qty 5

## 2017-07-28 NOTE — Progress Notes (Signed)
Patient recently discharged on 07/25/17 and readmitted on 07/26/17. Patient recently seen by CSW on 07/25/17.  CSW spoke with patient at bedside regarding recent rehospitalization and discharge planning. Patient briefly discussed prognosis and reported that he knows there is nothing that the doctors can do for him. Patient reported "I'm coming back if I get sick". CSW validated patient's feelings and inquired about patient's plan for discharge. Patient confirmed plan to return to [name].   CSW spoke with [name] staff member [name] regarding patient's discharge. Staff confirmed patient's ability to return.  CSW sent clinical documents to [facility].  [facility] contacted, patient aware and declined for CSW to contact family.  Patient's RN can call report to [phone], packet complete. CSW signing off, no other needs identified at this time.  Abundio Miu, [place] Social Worker [facility] Cell#: [phone]

## 2017-07-28 NOTE — Discharge Instructions (Addendum)
Please take your medications recently prescribed.  Follow up with your doctor for further care.  Avoid tobacco use.  Return if you have any concerns.

## 2017-07-28 NOTE — Discharge Summary (Addendum)
[facility] Discharge Summary   Patient: [name] BTD:[id number]   PCP: [name], MD DOB: 1960/04/19   Date of admission: 07/26/2017   Date of discharge:  07/28/2017    Discharge Diagnoses:  Principal Problem:   COPD exacerbation ([place]) Active Problems:   Seizure ([place])   CKD (chronic kidney disease), stage III ([place])   Pulmonary embolism ([place])   Anemia   Admitted From: ALF  Disposition:  ALF with home health  Recommendations for Outpatient Follow-up:  1. Please follow up with PCP in 1 week with CBC and BMP  2. Please follow up with oncology as recommended 3. Recommended to check daily weight 4. Please draw CBC on Thursday, 07/31/2017 and fax it to Dr [name] office. Cancer center office number [phone]  Follow-up Information    [name], MD. Schedule an appointment as soon as possible for a visit in 1 week(s).   Specialty:  Neurology Why:  repeat CBC and BMP in 1 week Contact information: [place] [address] [phone]          Diet recommendation: cardiac diet  Activity: The patient is advised to gradually reintroduce usual activities.  Discharge Condition: good  Code Status: DNR DNI  History of present illness: As per the H and P dictated on admission, "[name]  is a 57 y.o. male, stage 4 lung cancer, Copd on home o2, seizures, apparently c/o cough with yellow sputum and dysnpea since yesterday am.  Denies fever, chills, cp, palp, n/v, diarrhea, brbpr, black stool.  Pt presented to ED due to dyspnea.   In Ed, tx with albuterol neb, prednisone, magnesium without relief.   CXR IMPRESSION: No significant interval change compared to recent priors with right greater than left pleural effusion, emphysema and spiculated left suprahilar lung mass.  Wbc 6.3, Hgb 9.0, Plt 64 Na 140, K 3.7, Bun 29, Creatinine 1.5 Trop 0.02  Pt will be admitted for Copd exacerbation "  Hospital Course:  Summary of his  active problems in the hospital is as following. Acute on chronic respiratory distress, Suspected COPD exacerbation.  Ruled out recurrent malignant pleural effusions,   Patient presents with progressively worsening shortness of breath. Patient's O2 sats stable on home oxygen requirements.  No wheezing, no evidence of COPD exacerbation Feeling better and back on home O2. No need for antibiotics at present as I do not suspect patient actually has an active infection or pneumonia. Continue increased dose lasix 40 daily. Change combivent to  duoneb.  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 Patient currently receiving palliative chemotherapy with oncology.  Continue home pain regimen  Macrocytic anemia:chronic. No active bleeding. Patient's blood counts have been around 7 and appears stable. Continue on oral iron supplements.  Thrombocytopenia  Acute Chemotherapy induced  Platelets are 36 today Baseline 230 D/w Dr [name] Pt will get repeat labs in cancer center Currently with low platelets Dr [name] asked to hold apixaban for 3 days, and restart at same dose Also recommended that most likely pt is at nadir for platelets.  Essential hypertension -Continue metoprolol   History of pulmonary emboluson Eliquis: Patient was recently diagnosed with pulmonary embolism on 6/10. Patient reports taking Eliquis as prescribed. -Will need to continueEliquis,  Diastolic HBZ:JIRCVEL. Patientwith 1+ pitting lower extremity edema noted. Patient's lastechocardiogram EF 55 to 60% with grade 1 diastolic dysfunction on 3/81/0175. Continue Lasix,  dose increased to 40mg  daily  Chronic kidney disease stageIII:Review of record shows patient creatinine has been fluctuating. -Continue to monitor  Tobacco abuse  Chronic abdominal pain, chronic chest pain. Continue  gabapentin Xr abdomen and US abdomen negative.   Hypokalemia  Replacing  Recommended outpatient follow up   All other chronic medical condition were stable during the hospitalization.  Patient was seen by physical therapy, who recommended home health, which was arranged by Education officer, museum and case Freight forwarder. On the day of the discharge the patient's vitals were stable , and no other acute medical condition were reported by patient. the patient was felt safe to be discharge at home with home health.  Consultants: none Procedures: none  DISCHARGE MEDICATION: Allergies as of 07/28/2017   No Known Allergies     Medication List    STOP taking these medications   Ipratropium-Albuterol 20-100 MCG/ACT Aers respimat Commonly known as:  COMBIVENT Replaced by:  ipratropium-albuterol 0.5-2.5 (3) MG/3ML Soln   predniSONE 10 MG tablet Commonly known as:  DELTASONE     TAKE these medications   acetaminophen 500 MG tablet Commonly known as:  TYLENOL Take 1 tablet (500 mg total) by mouth 2 (two) times daily as needed for mild pain.   albuterol (2.5 MG/3ML) 0.083% nebulizer solution Commonly known as:  PROVENTIL 1 neb every 4-6 hours as needed for wheezing and shortness of breath   ALKA-SELTZER PLUS COLD PO Take 2 tablets by mouth at bedtime as needed (COUGH).   apixaban 5 MG Tabs tablet Commonly known as:  ELIQUIS Take 1 tablet (5 mg total) by mouth 2 (two) times daily. What changed:  how much to take   azithromycin 250 MG tablet Commonly known as:  ZITHROMAX 1 tab po daily x 4 more days, zero refills   dexamethasone 4 MG tablet Commonly known as:  DECADRON Take 4 mg by mouth 2 (two) times daily. The day before, the day of, and the day after chemotherapy every three weeks   escitalopram 5 MG tablet Commonly known as:  LEXAPRO Take 1 tablet (5 mg total) by mouth at bedtime.   feeding supplement (ENSURE ENLIVE) Liqd Take 237 mLs by mouth 3 (three) times daily between meals.     ferrous sulfate 325 (65 FE) MG tablet Take 1 tablet (325 mg total) by mouth 2 (two) times daily with a meal.   Fluticasone-Umeclidin-Vilant 100-62.5-25 MCG/INH Aepb Commonly known as:  TRELEGY ELLIPTA Inhale 1 puff into the lungs daily.   folic acid 1 MG tablet Commonly known as:  FOLVITE Take 1 tablet (1 mg total) by mouth daily.   furosemide 40 MG tablet Commonly known as:  LASIX Take 1 tablet (40 mg total) by mouth daily.   gabapentin 100 MG capsule Commonly known as:  NEURONTIN Take 1 capsule (100 mg total) by mouth 3 (three) times daily.   hydrOXYzine 10 MG tablet Commonly known as:  ATARAX/VISTARIL Take 1 tablet (10 mg total) by mouth 3 (three) times daily as needed for anxiety.   ipratropium-albuterol 0.5-2.5 (3) MG/3ML Soln Commonly known as:  DUONEB Take 3 mLs by nebulization 4 (four) times daily. Replaces:  Ipratropium-Albuterol 20-100 MCG/ACT Aers respimat   loratadine 10 MG tablet Commonly known as:  CLARITIN Take 10 mg by mouth daily.   metoprolol tartrate 25 MG tablet Commonly known as:  LOPRESSOR Take 12.5 mg by mouth 2 (two) times daily.   MUCINEX 600 MG 12 hr tablet Generic drug:  guaiFENesin Take 1,200 mg by mouth 2 (  two) times daily.   ondansetron 4 MG disintegrating tablet Commonly known as:  ZOFRAN-ODT Take 4 mg by mouth every 8 (eight) hours as needed for nausea or vomiting.   Oxycodone HCl 10 MG Tabs Take 1 tablet (10 mg total) by mouth every 4 (four) hours as needed for up to 15 doses (pain/shortness of breath).   pantoprazole 40 MG tablet Commonly known as:  PROTONIX Take 1 tablet (40 mg total) by mouth 2 (two) times daily.   polyethylene glycol packet Commonly known as:  MIRALAX / GLYCOLAX Take 17 g by mouth daily. Start taking on:  07/29/2017   potassium chloride 10 MEQ tablet Commonly known as:  K-DUR Take 2 tablets (20 mEq total) by mouth daily.   senna-docusate 8.6-50 MG tablet Commonly known as:  Senokot-S Take 2 tablets by  mouth 2 (two) times daily.   sucralfate 1 GM/10ML suspension Commonly known as:  CARAFATE Take 10 mLs (1 g total) by mouth 4 (four) times daily -  with meals and at bedtime.   THERA-TABS PO Take 1 tablet by mouth daily.   TUSSIN DM 10-100 MG/5ML liquid Generic drug:  Dextromethorphan-guaiFENesin Take 5 mLs by mouth every 12 (twelve) hours.      No Known Allergies Discharge Instructions    Diet - low sodium heart healthy   Complete by:  As directed    Discharge instructions   Complete by:  As directed    It is important that you read following instructions as well as go over your medication list with RN to help you understand your care after this hospitalization.  Discharge Instructions: Please follow-up with PCP in one week  Please request your primary care physician to go over all Hospital Tests and Procedure/Radiological results at the follow up,  Please get all Hospital records sent to your PCP by signing hospital release before you go home.   Do not take more than prescribed Pain, Sleep and Anxiety Medications. You were cared for by a hospitalist during your hospital stay. If you have any questions about your discharge medications or the care you received while you were in the hospital after you are discharged, you can call the unit and ask to speak with the hospitalist on call if the hospitalist that took care of you is not available.  Once you are discharged, your primary care physician will handle any further medical issues. Please note that NO REFILLS for any discharge medications will be authorized once you are discharged, as it is imperative that you return to your primary care physician (or establish a relationship with a primary care physician if you do not have one) for your aftercare needs so that they can reassess your need for medications and monitor your lab values. You Must read complete instructions/literature along with all the possible adverse reactions/side  effects for all the Medicines you take and that have been prescribed to you. Take any new Medicines after you have completely understood and accept all the possible adverse reactions/side effects. Wear Seat belts while driving. If you have smoked or chewed Tobacco in the last 2 yrs please stop smoking and/or stop any Recreational drug use.   Increase activity slowly   Complete by:  As directed      Discharge Exam: Filed Weights   07/27/17 0953  Weight: 53 kg (116 lb 13.5 oz)   Vitals:   07/28/17 0758 07/28/17 1149  BP:  139/78  Pulse:  90  Resp:  17  Temp:  97.8  F (36.6 C)  SpO2: 97% 100%   General: Appear in mild distress, no Rash; Oral Mucosa moist. Cardiovascular: S1 and S2 Present, no Murmur, no JVD Respiratory: Bilateral Air entry present and Clear to Auscultation, no Crackles, no wheezes Abdomen: Bowel Sound present, Soft and subjective mild tenderness Extremities: no Pedal edema, no calf tenderness Neurology: Grossly no focal neuro deficit.  The results of significant diagnostics from this hospitalization (including imaging, microbiology, ancillary and laboratory) are listed below for reference.    Significant Diagnostic Studies: Ct Abdomen Pelvis Wo Contrast  Result Date: 07/03/2017 CLINICAL DATA:  Abdominal distension, worsening shortness of breath, lung cancer, pulmonary embolism, COPD, CHF EXAM: CT ABDOMEN AND PELVIS WITHOUT CONTRAST TECHNIQUE: Multidetector CT imaging of the abdomen and pelvis was performed following the standard protocol without IV contrast. Sagittal and coronal MPR images reconstructed from axial data set. Patient drank dilute oral contrast for exam COMPARISON:  12/16/2016 FINDINGS: Lower chest: Moderate RIGHT pleural effusion. Peribronchial thickening, more pronounced in LEFT lower lobe with question mucous plugging emphysematous changes at LEFT base. Compressive atelectasis RIGHT lower lobe. Minimal LEFT pleural fluid. Hepatobiliary: Gallbladder and  liver unremarkable Pancreas: Normal appearance Spleen: Normal appearance Adrenals/Urinary Tract: Adrenal glands, kidneys, ureters, and bladder normal appearance Stomach/Bowel: Visualization of proximal appendix in RIGHT pelvis, grossly unremarkable. Stool throughout colon. Large and small bowel loops normal appearance. Distended stomach with wall thickening of antrum and pylorus extending in the duodenal bulb could reflect gastritis. Vascular/Lymphatic: Atherosclerotic calcifications aorta and iliac arteries. Tip of central venous catheter at cavoatrial junction. No adenopathy. Reproductive: Minimal prostatic enlargement. Other: Low-attenuation free fluid in pelvis and minimally at scattered in upper abdomen. No free air. No hernia. Musculoskeletal: Sclerotic T12 vertebral body again identified, question due to osseous metastatic disease. Additional sclerotic focus at the inferior L2 vertebral body. Lytic lesion with sclerotic rim at inferior L3 unchanged. Metallic foreign body at inferior medial LEFT buttock. IMPRESSION: RIGHT pleural effusion and basilar atelectasis. Minimal LEFT pleural effusion and scattered ascites greatest in pelvis. Wall thickening of distal gastric antrum pylorus, could reflect gastritis. Suspected sclerotic metastatic lesions of T12 and L2 with an additional chronic well-circumscribed lytic focus at inferior L3. Electronically Signed   By: Lavonia Dana M.D.   On: 07/03/2017 13:02   Dg Chest 1 View  Result Date: 07/11/2017 CLINICAL DATA:  Status post thoracentesis on the right EXAM: CHEST  1 VIEW COMPARISON:  07/11/2017 FINDINGS: Cardiac shadows within normal limits. Right chest wall port is again seen and stable. Interval right thoracentesis has been performed. A minimal basilar pneumothorax is noted. This is likely related incomplete re-expansion of the right lower lobe. Correlation with patient's symptomatology is recommended. No other focal abnormality is seen. Stable left pleural  effusion is noted. IMPRESSION: Small right basilar pneumothorax. This is likely related incomplete re-expansion of the lower lobe. Correlate with patient's symptomatology. Electronically Signed   By: Inez Catalina M.D.   On: 07/11/2017 13:47   Dg Chest 1 View  Result Date: 07/05/2017 CLINICAL DATA:  S/p right sided thoracentesis today, 1.2 liters removed. EXAM: CHEST  1 VIEW COMPARISON:  Chest x-ray dated 07/04/2017. FINDINGS: Improved aeration at the RIGHT lung base, significantly decreased amount of pleural effusion compared to the earlier CT of 06/30/2017. Stable small LEFT pleural effusion and/or atelectasis. No pneumothorax seen. Heart size and mediastinal contours are stable. RIGHT chest wall Port-A-Cath is stable in position with tip overlying the RIGHT atrium. No acute or suspicious osseous finding. IMPRESSION: Status post thoracentesis.  No  pneumothorax. Electronically Signed   By: Franki Cabot M.D.   On: 07/05/2017 14:06   Dg Chest 2 View  Result Date: 07/27/2017 CLINICAL DATA:  Chronic shortness of breath EXAM: CHEST - 2 VIEW COMPARISON:  07/23/2017, 07/20/2017, 07/18/2017, CT chest 06/30/2017 FINDINGS: Right-sided central venous port tip overlies the cavoatrial region. There are small bilateral pleural effusions without significant change. Emphysema left greater than right. Spiculated opacity in the left suprahilar lung, no change. Normal heart size. No pneumothorax. IMPRESSION: No significant interval change compared to recent priors with right greater than left pleural effusion, emphysema and spiculated left suprahilar lung mass. Electronically Signed   By: Donavan Foil M.D.   On: 07/27/2017 00:25   Dg Chest 2 View  Result Date: 07/20/2017 CLINICAL DATA:  Shortness of breath. EXAM: CHEST - 2 VIEW COMPARISON:  Chest x-ray from yesterday. FINDINGS: Unchanged right chest wall port catheter. The heart size and mediastinal contours are within normal limits. Normal pulmonary vascularity. The  lungs remain hyperinflated with emphysematous changes. Increased interstitial markings in the right lung are similar to prior study. Unchanged small right pleural effusion. Unchanged left upper lobe spiculated mass. No acute osseous abnormality. IMPRESSION: 1. COPD with unchanged left upper lobe spiculated mass and small right pleural effusion. Electronically Signed   By: Titus Dubin M.D.   On: 07/20/2017 23:28   Dg Chest 2 View  Result Date: 07/18/2017 CLINICAL DATA:  Lung cancer. EXAM: CHEST - 2 VIEW COMPARISON:  Chest x-ray 07/16/2017, 05/28/2017, 03/17/2017. CT 03/13/2017. FINDINGS: PowerPort catheter noted with tip over right atrium. Cardiomegaly with normal pulmonary vascularity. Hyperexpansion of both lung fields noted consistent with COPD. Mild mid lung field basilar subsegmental atelectasis. Unchanged bilateral basilar pleural thickening consistent with persistent effusions and or scarring. IMPRESSION: 1.  PowerPort catheter noted in stable position. 2. COPD. Mild subsegmental atelectasis noted the mid lung fields and both bases. Mild bilateral basilar pleural thickening consistent with persistent effusions and/or scarring. No acute abnormality identified. 3.  Stable cardiomegaly.  No pulmonary venous congestion. Electronically Signed   By: Marcello Moores  Register   On: 07/18/2017 14:02   Dg Chest 2 View  Result Date: 07/16/2017 CLINICAL DATA:  Weakness, dyspnea EXAM: CHEST - 2 VIEW COMPARISON:  07/13/2017 chest radiograph. FINDINGS: Right subclavian MediPort terminates at the cavoatrial junction. Stable cardiomediastinal silhouette with normal heart size. No pneumothorax. Stable blunting of the right greater than left costophrenic angles. No pulmonary edema. Severe emphysema, asymmetrically prominent in the left lung, with lung hyperinflation. No acute consolidative airspace disease. Stable mild scarring at the lung bases. IMPRESSION: 1. No acute cardiopulmonary disease. 2. Lung hyperinflation and  severe emphysema asymmetrically involving the left lung, compatible with COPD. 3. Stable blunting of the right greater than left costophrenic angles, which could represent small pleural effusions and/or pleural-parenchymal scarring. Electronically Signed   By: Ilona Sorrel M.D.   On: 07/16/2017 16:28   Dg Chest 2 View  Result Date: 07/13/2017 CLINICAL DATA:  57 year old male with dyspnea and chest pain. EXAM: CHEST - 2 VIEW COMPARISON:  Chest radiograph dated 07/12/2017 FINDINGS: Right-sided Port-A-Cath with tip in stable position. There is severe emphysematous changes of the lungs. Faint nodular density in the left upper lobe better seen on the prior CT. There is no pneumothorax. Small stable bilateral pleural effusions, right greater left. The cardiac silhouette is within normal limits. No acute osseous pathology. IMPRESSION: 1. No acute cardiopulmonary process. 2. Severe emphysema and stable bilateral pleural effusions with Electronically Signed   By: Milas Hock  Radparvar M.D.   On: 07/13/2017 21:53   Dg Chest 2 View  Result Date: 07/11/2017 CLINICAL DATA:  Shortness of breath.  History of lung cancer. EXAM: CHEST - 2 VIEW COMPARISON:  Chest 07/05/2017, CT chest 06/30/2017 FINDINGS: Heart size and pulmonary vascularity are normal. Focal scarring in the left suprahilar and left upper lung region corresponding to lesion better seen at previous CT chest. Small bilateral pleural effusions demonstrating mild increased since previous study. There is developing right perihilar and basilar infiltration which could represent asymmetrical edema or pneumonia. No pneumothorax. Power port type central venous catheter with tip over the cavoatrial junction region. IMPRESSION: Small but increasing bilateral pleural effusions. Increasing infiltration or edema in the right perihilar and basilar lung regions. Focal scarring in the left upper lung is identified, corresponding to changes better seen on previous CT scan.  Electronically Signed   By: Lucienne Capers M.D.   On: 07/11/2017 00:30   Dg Chest 2 View  Result Date: 07/04/2017 CLINICAL DATA:  Shortness of breath EXAM: CHEST - 2 VIEW COMPARISON:  July 04, 2017 FINDINGS: A right Port-A-Cath is in good position. Bilateral pleural effusions are stable. Stable left upper lobe mass, unchanged. Increased lucency in the left lung compared to the right is stable. Increased haziness on the right is stable compared to the left. No change in the cardiomediastinal silhouette. IMPRESSION: No interval change in bilateral pleural effusions, a left upper lobe mass, the right Port-A-Cath, or haziness in the right chest. Electronically Signed   By: Dorise Bullion III M.D   On: 07/04/2017 22:33   Dg Chest 2 View  Result Date: 07/04/2017 CLINICAL DATA:  Worsening shortness of Breath EXAM: CHEST - 2 VIEW COMPARISON:  06/30/2017 FINDINGS: Cardiac shadow is stable. Right chest wall port is again seen. Right pleural effusion is seen and stable from the previous CT examination. Stable left upper lobe mass lesion is noted. No acute bony abnormality is noted. IMPRESSION: Stable left upper lobe mass. Right-sided pleural effusion similar to that seen on recent CT examination. Electronically Signed   By: Inez Catalina M.D.   On: 07/04/2017 08:38   Dg Chest 2 View  Result Date: 06/30/2017 CLINICAL DATA:  Initial evaluation for acute shortness of breath. History of lung cancer. EXAM: CHEST - 2 VIEW COMPARISON:  Prior radiograph from 06/25/2017. FINDINGS: Right-sided Port-A-Cath in place with tip overlying the proximal right atrium. Cardiac and mediastinal silhouettes stable in size and contour, and remain within normal limits. Lungs normally inflated. Small moderate right pleural effusion is increased from previous. Scattered vascular congestion with crowding throughout the right lung, progressed from previous. Probable superimposed right basilar atelectasis. Blunting of the left costophrenic  angle, which could reflect effusion and/or chronic pleural reaction/scarring, similar to previous. Left lung otherwise largely clear. Left perihilar architectural distortion with possible spiculated nodule, similar to previous. No pneumothorax. No acute osseous abnormality. IMPRESSION: 1. Small to moderate layering right pleural effusion, increased from previous, with worsened vascular congestion/crowding and probable atelectatic changes within the right lung. 2. Blunting of the left costophrenic angle, which could reflect small effusion and/or chronic pleural reaction/scarring, similar to previous. 3. Left perihilar architectural distortion with underlying spiculated nodular density, consistent with known lung malignancy. Electronically Signed   By: Jeannine Boga M.D.   On: 06/30/2017 04:36   Ct Angio Chest Pe W And/or Wo Contrast  Addendum Date: 06/30/2017   ADDENDUM REPORT: 06/30/2017 08:21 ADDENDUM: Critical Value/emergent results were called by telephone at the time of interpretation  on 06/30/2017 at 8:21 am to Lewis in the ED who verbally acknowledged these results. Electronically Signed   By: Genevie Ann M.D.   On: 06/30/2017 08:21   Result Date: 06/30/2017 CLINICAL DATA:  57 year old male with stage IV lung cancer and severe shortness of breath. EXAM: CT ANGIOGRAPHY CHEST WITH CONTRAST TECHNIQUE: Multidetector CT imaging of the chest was performed using the standard protocol during bolus administration of intravenous contrast. Multiplanar CT image reconstructions and MIPs were obtained to evaluate the vascular anatomy. CONTRAST:  180mL ISOVUE-370 IOPAMIDOL (ISOVUE-370) INJECTION 76% COMPARISON:  Chest CT 04/13/2017, chest CTA 03/13/2017. FINDINGS: Cardiovascular: Good contrast bolus timing in the pulmonary arterial tree. There is no right lung pulmonary artery filling defect. 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 However, this is inseparable from abnormal peribronchial soft tissue tracking to the left hilum from the spiculated left upper lobe tumor. The vessel thrombus is primarily nonocclusive, although distal obliteration of the left upper lobe artery occurs but is stable since 03/13/2017. 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 Additionally, the right chest Port-A-Cath was injected. It is unclear whether there could be thrombus within the SVC and right atrium (series 5, image 184). This could be mixing artifact. Stable cardiac size. No pericardial effusion. Stable visible aorta. Mediastinum/Nodes: Loss of normal mediastinal fat planes, but no discrete mediastinal mass identified. Lungs/Pleura: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

## 2017-07-28 NOTE — Progress Notes (Signed)
Patient refused to be weighed and refused vitals as well.

## 2017-07-28 NOTE — NC FL2 (Signed)
[place] LEVEL OF CARE SCREENING TOOL     IDENTIFICATION  Patient Name: [name] Birthdate: 1960-09-22 Sex: male Admission Date (Current Location): 07/26/2017  [facility] and [place] Number:  Herbalist and Address:  [facility],  [place], [place]      Provider Number: [id number]  Attending Physician Name and Address:  Lavina Hamman, MD  Relative Name and Phone Number:       Current Level of Care: Hospital Recommended Level of Care: [place] Prior Approval Number:    Date Approved/Denied:   PASRR Number: [id number]  Discharge Plan: Other (Comment)(Assisted Living Facility)    Current Diagnoses: Patient Active Problem List   Diagnosis Date Noted  . Anemia 07/24/2017  . Normocytic anemia 07/24/2017  . Chronic diastolic (congestive) heart failure ([place]) 07/24/2017  . Recurrent pleural effusion on right 07/15/2017  . Acute on chronic respiratory failure ([place]) 07/11/2017  . History of pulmonary embolism 07/11/2017  . PE (pulmonary thromboembolism) ([place]) 06/30/2017  . Pulmonary embolism ([place]) 06/30/2017  . AKI (acute kidney injury) ([place]) 06/25/2017  . Dyspnea 06/21/2017  . Chronic combined systolic (congestive) and diastolic (congestive) heart failure ([place])   . Constipation 06/07/2017  . Pain   . Acute on chronic respiratory failure with hypoxia ([place]) 05/28/2017  . Chronic respiratory failure with hypoxia ([place]) 04/01/2017  . Leucocytosis 03/16/2017  . CKD (chronic kidney disease), stage III ([place]) 03/12/2017  . Malnutrition of moderate degree 03/10/2017  . COPD exacerbation ([place]) 03/09/2017  . Macrocytic anemia 02/07/2017  . Severe malnutrition ([place]) 01/03/2017  . DOE (dyspnea on exertion) 12/30/2016  . Nausea   . Hypervolemia   . HCAP (healthcare-associated pneumonia) 12/19/2016  . SOB (shortness of breath) 12/19/2016  . Erosive gastropathy 12/18/2016  . Gastritis 12/18/2016  .  Hematemesis 12/16/2016  . Intractable nausea and vomiting 12/16/2016  . HTN (hypertension) 10/30/2016  . Chronic obstructive pulmonary disease ([place]) 09/02/2016  . COPD (chronic obstructive pulmonary disease) ([place]) 08/19/2016  . Dehydration 06/04/2016  . Abdominal pain 06/04/2016  . 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 Adenocarcinoma of left lung, stage 4 ([place]) 05/02/2016  . Goals of care, counseling/discussion 05/02/2016  . Encounter for antineoplastic chemotherapy 05/02/2016  . Tobacco abuse 05/30/2011  . Alcohol abuse 05/30/2011  . Seizure ([place]) 05/28/2011    Orientation RESPIRATION BLADDER Height & Weight     Self, Time, Situation, Place  O2 Continent Weight: 116 lb 13.5 oz (53 kg) Height:  5\' 11"  (180.3 cm)  BEHAVIORAL SYMPTOMS/MOOD NEUROLOGICAL BOWEL NUTRITION STATUS      Continent Diet(regular)  AMBULATORY STATUS COMMUNICATION OF NEEDS Skin   Supervision Verbally Normal                       Personal Care Assistance Level of Assistance  Bathing, Feeding, Dressing Bathing Assistance: Limited assistance Feeding assistance: Independent Dressing Assistance: Limited assistance     Functional Limitations Info  Sight, Hearing, Speech Sight Info: Adequate Hearing Info: Adequate Speech Info: Adequate    SPECIAL CARE FACTORS FREQUENCY  PT (By licensed PT)     PT Frequency: HHPT              Contractures Contractures Info: Not present    Additional Factors Info  Code Status, Allergies Code Status Info: DNR Allergies Info: NKA           Current Medications (07/28/2017):  This is the current hospital active medication list Current Facility-Administered  Medications  Medication Dose Route Frequency Provider Last Rate Last Dose  . 0.9 %  sodium chloride infusion  250 mL Intravenous PRN Jani Gravel, MD      . acetaminophen (TYLENOL) tablet 650 mg  650 mg Oral Q6H PRN Jani Gravel, MD       Or  . acetaminophen (TYLENOL) suppository 650 mg  650 mg Rectal Q6H PRN  Jani Gravel, MD      . albuterol (PROVENTIL) (2.5 MG/3ML) 0.083% nebulizer solution 2.5 mg  2.5 mg Nebulization Q6H PRN Jani Gravel, MD      . albuterol (PROVENTIL) (2.5 MG/3ML) 0.083% nebulizer solution 2.5 mg  2.5 mg Nebulization Q6H WA Lavina Hamman, MD   2.5 mg at 07/28/17 0754  . apixaban (ELIQUIS) tablet 5 mg  5 mg Oral BID Jani Gravel, MD   5 mg at 07/28/17 1017  . escitalopram (LEXAPRO) tablet 5 mg  5 mg Oral QHS Jani Gravel, MD   5 mg at 07/27/17 2227  . feeding supplement (ENSURE ENLIVE) (ENSURE ENLIVE) liquid 237 mL  237 mL Oral TID BM Jani Gravel, MD   237 mL at 07/27/17 2020  . ferrous sulfate tablet 325 mg  325 mg Oral BID WC Jani Gravel, MD   325 mg at 07/28/17 0817  . fluticasone furoate-vilanterol (BREO ELLIPTA) 100-25 MCG/INH 1 puff  1 puff Inhalation Daily Jani Gravel, MD   1 puff at 07/28/17 0755   And  . umeclidinium bromide (INCRUSE ELLIPTA) 62.5 MCG/INH 1 puff  1 puff Inhalation Daily Jani Gravel, MD   1 puff at 07/28/17 0754  . folic acid (FOLVITE) tablet 1 mg  1 mg Oral Daily Jani Gravel, MD   1 mg at 07/27/17 1340  . furosemide (LASIX) tablet 40 mg  40 mg Oral Daily Jani Gravel, MD   40 mg at 07/27/17 1709  . gabapentin (NEURONTIN) capsule 100 mg  100 mg Oral TID Jani Gravel, MD   100 mg at 07/27/17 2227  . guaiFENesin (MUCINEX) 12 hr tablet 1,200 mg  1,200 mg Oral BID Jani Gravel, MD   1,200 mg at 07/27/17 2226  . hydrOXYzine (ATARAX/VISTARIL) tablet 10 mg  10 mg Oral TID PRN Jani Gravel, MD   10 mg at 07/28/17 0246  . loratadine (CLARITIN) tablet 10 mg  10 mg Oral Daily Jani Gravel, MD      . MEDLINE mouth rinse  15 mL Mouth Rinse BID Lavina Hamman, MD      . metoprolol tartrate (LOPRESSOR) tablet 12.5 mg  12.5 mg Oral BID Jani Gravel, MD   12.5 mg at 07/28/17 1017  . multivitamin with minerals tablet   Oral Daily Jani Gravel, MD      . ondansetron [facility]) injection 4 mg  4 mg Intravenous Q6H PRN Lavina Hamman, MD   4 mg at 07/27/17 2017  . oxyCODONE (Oxy IR/ROXICODONE)  immediate release tablet 10 mg  10 mg Oral Q4H PRN Jani Gravel, MD   10 mg at 07/28/17 0246  . pantoprazole (PROTONIX) EC tablet 40 mg  40 mg Oral BID Jani Gravel, MD   40 mg at 07/28/17 1017  . polyethylene glycol (MIRALAX / GLYCOLAX) packet 17 g  17 g Oral Daily Berle Mull M, MD      . potassium chloride SA (K-DUR,KLOR-CON) CR tablet 40 mEq  40 mEq Oral Once Lavina Hamman, MD      . senna-docusate (Senokot-S) tablet 1 tablet  1 tablet Oral BID Jani Gravel,  MD   1 tablet at 07/27/17 1341  . sodium chloride flush (NS) 0.9 % injection 10-40 mL  10-40 mL Intracatheter PRN Lavina Hamman, MD      . sodium chloride flush (NS) 0.9 % injection 3 mL  3 mL Intravenous Q12H Jani Gravel, MD      . sodium chloride flush (NS) 0.9 % injection 3 mL  3 mL Intravenous PRN Jani Gravel, MD      . sucralfate (CARAFATE) 1 GM/10ML suspension 1 g  1 g Oral TID WC & HS Jani Gravel, MD   1 g at 07/28/17 1146     Discharge Medications: Please see discharge summary for a list of discharge medications.  Relevant Imaging Results:  Relevant Lab Results:   Additional Information SSN [id number]  [name], LCSW
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
## 2017-07-28 NOTE — Progress Notes (Signed)
Patient discharged to ALF via [facility]. Report called to facility, updated with new information including holding Eliquis until 7/12. All belongings w/ patient.

## 2017-07-28 NOTE — ED Triage Notes (Signed)
Arrives from [facility] via EMS. Per their report, SOB since d/c from admission for COPD exacerbation from hospital today. Clear lung sounds on diminished left sided. En Route, BP 118/80, hr 108, 92% on 3 liters initially, rec'd 15 Albuterol and 1mg  Atrovent.

## 2017-07-28 NOTE — Evaluation (Addendum)
Physical Therapy Evaluation Patient Details Name: [name] MRN: [id number] DOB: 1960/09/20 Today's Date: 07/28/2017   History of Present Illness  57 y.o. male, stage 4 lung cancer, Copd on home o2, seizures, apparently c/o cough with yellow sputum and dysnpea   Clinical Impression  Pt admitted with above diagnosis. Pt currently with functional limitations due to the deficits listed below (see PT Problem List). Pt ambulated 18' holding IV pole, distance limited by 3/4 dyspnea. SaO2 97% on 2L O2 [place]. Pt has had a significant decline in activity tolerance compared to PT eval 07/02/17 in which he ambulated 200'.  Instructed pt in BUE/LE strengthening exercises to be done independently.  Pt will benefit from skilled PT to increase their independence and safety with mobility to allow discharge to the venue listed below.       Follow Up Recommendations Home health PT    Equipment Recommendations  None recommended by PT    Recommendations for Other Services       Precautions / Restrictions Precautions Precautions: Other (comment) Precaution Comments: uses 2-3 L O2 prn at home Restrictions Weight Bearing Restrictions: No      Mobility  Bed Mobility Overal bed mobility: Independent                Transfers Overall transfer level: Independent                  Ambulation/Gait Ambulation/Gait assistance: Supervision Gait Distance (Feet): 18 Feet Assistive device: IV Pole Gait Pattern/deviations: Step-through pattern;Decreased stride length     General Gait Details: distance limited by 3/4 dyspnea, SaO2 97% on 2 L O2 [place], no loss of balance  Stairs            Wheelchair Mobility    Modified Rankin (Stroke Patients Only)       Balance Overall balance assessment: Modified Independent                                           Pertinent Vitals/Pain Pain Assessment: 0-10 Pain Score: 4  Pain Location: abdomen Pain Descriptors /  Indicators: Discomfort Pain Intervention(s): Limited activity within patient's tolerance;Monitored during session    Home Living Family/patient expects to be discharged to:: Assisted living Living Arrangements: Alone             Home Equipment: Walker - 4 wheels;Other (comment)(oxygen) Additional Comments: pt plans to return to ALF    Prior Function Level of Independence: Independent with assistive device(s)         Comments: uses rollator prn     Hand Dominance        Extremity/Trunk Assessment   Upper Extremity Assessment Upper Extremity Assessment: Overall WFL for tasks assessed    Lower Extremity Assessment Lower Extremity Assessment: Overall WFL for tasks assessed    Cervical / Trunk Assessment Cervical / Trunk Assessment: Normal  Communication   Communication: No difficulties  Cognition Arousal/Alertness: Awake/alert Behavior During Therapy: WFL for tasks assessed/performed Overall Cognitive Status: Within Functional Limits for tasks assessed                                        General Comments      Exercises General Exercises - Upper Extremity Shoulder Flexion: AROM;Both;20 reps;Seated General Exercises - Lower Extremity Ankle  Circles/Pumps: AROM;Both;15 reps;Seated Long Arc Quad: AROM;Both;10 reps;Seated Hip Flexion/Marching: AROM;Both;10 reps;Seated   Assessment/Plan    PT Assessment Patient needs continued PT services  PT Problem List Decreased mobility;Decreased activity tolerance;Cardiopulmonary status limiting activity       PT Treatment Interventions Gait training;DME instruction;Therapeutic exercise    PT Goals (Current goals can be found in the Care Plan section)  Acute Rehab PT Goals Patient Stated Goal: to be albe to walk farther PT Goal Formulation: With patient Time For Goal Achievement: 08/11/17 Potential to Achieve Goals: Good    Frequency Min 3X/week   Barriers to discharge        Co-evaluation                AM-PAC PT "6 Clicks" Daily Activity  Outcome Measure Difficulty turning over in bed (including adjusting bedclothes, sheets and blankets)?: None Difficulty moving from lying on back to sitting on the side of the bed? : None Difficulty sitting down on and standing up from a chair with arms (e.g., wheelchair, bedside commode, etc,.)?: None Help needed moving to and from a bed to chair (including a wheelchair)?: A Little Help needed walking in hospital room?: A Little Help needed climbing 3-5 steps with a railing? : A Little 6 Click Score: 21    End of Session Equipment Utilized During Treatment: Gait belt;Oxygen Activity Tolerance: Patient limited by fatigue Patient left: in chair;with call bell/phone within reach Nurse Communication: Mobility status PT Visit Diagnosis: Difficulty in walking, not elsewhere classified (R26.2);Pain    Time: 3736-6815 PT Time Calculation (min) (ACUTE ONLY): 29 min   Charges:   PT Evaluation $PT Eval Low Complexity: 1 Low PT Treatments $Therapeutic Activity: 8-22 mins   PT G Codes:          Philomena Doheny 07/28/2017, 11:39 AM [phone]

## 2017-07-28 NOTE — ED Notes (Signed)
Bed: ZL93 Expected date:  Expected time:  Means of arrival:  Comments: EMS cancer patient SOB/abdominal pain

## 2017-07-28 NOTE — ED Notes (Signed)
[facility] notified for transport

## 2017-07-28 NOTE — ED Notes (Signed)
Ambulated patient in hallway, saturations maintained 93-95% on 2L (baseline for patient). Pt did have to stop and rest.

## 2017-07-29 ENCOUNTER — Inpatient Hospital Stay (HOSPITAL_COMMUNITY)
Admission: EM | Admit: 2017-07-29 | Discharge: 2017-07-31 | DRG: 190 | Disposition: A | Discharge status: Nursing Home (Includes ICF) | Payer: Medicaid Other | Attending: Family Medicine | Admitting: Family Medicine

## 2017-07-29 ENCOUNTER — Emergency Department (HOSPITAL_COMMUNITY): Payer: Medicaid Other

## 2017-07-29 ENCOUNTER — Other Ambulatory Visit: Payer: Self-pay

## 2017-07-29 ENCOUNTER — Encounter (HOSPITAL_COMMUNITY): Payer: Self-pay

## 2017-07-29 DIAGNOSIS — J441 Chronic obstructive pulmonary disease with (acute) exacerbation: Secondary | ICD-10-CM | POA: Diagnosis present

## 2017-07-29 DIAGNOSIS — Z66 Do not resuscitate: Secondary | ICD-10-CM | POA: Diagnosis present

## 2017-07-29 DIAGNOSIS — Z7901 Long term (current) use of anticoagulants: Secondary | ICD-10-CM | POA: Diagnosis not present

## 2017-07-29 DIAGNOSIS — R0602 Shortness of breath: Secondary | ICD-10-CM | POA: Diagnosis not present

## 2017-07-29 DIAGNOSIS — Z681 Body mass index (BMI) 19 or less, adult: Secondary | ICD-10-CM | POA: Diagnosis not present

## 2017-07-29 DIAGNOSIS — D696 Thrombocytopenia, unspecified: Secondary | ICD-10-CM | POA: Diagnosis not present

## 2017-07-29 DIAGNOSIS — D6959 Other secondary thrombocytopenia: Secondary | ICD-10-CM | POA: Diagnosis present

## 2017-07-29 DIAGNOSIS — R062 Wheezing: Secondary | ICD-10-CM | POA: Diagnosis present

## 2017-07-29 DIAGNOSIS — C3492 Malignant neoplasm of unspecified part of left bronchus or lung: Secondary | ICD-10-CM | POA: Diagnosis present

## 2017-07-29 DIAGNOSIS — C7951 Secondary malignant neoplasm of bone: Secondary | ICD-10-CM | POA: Diagnosis present

## 2017-07-29 DIAGNOSIS — I5032 Chronic diastolic (congestive) heart failure: Secondary | ICD-10-CM | POA: Diagnosis present

## 2017-07-29 DIAGNOSIS — I10 Essential (primary) hypertension: Secondary | ICD-10-CM | POA: Diagnosis present

## 2017-07-29 DIAGNOSIS — D6481 Anemia due to antineoplastic chemotherapy: Secondary | ICD-10-CM | POA: Diagnosis present

## 2017-07-29 DIAGNOSIS — N183 Chronic kidney disease, stage 3 unspecified: Secondary | ICD-10-CM | POA: Diagnosis present

## 2017-07-29 DIAGNOSIS — J449 Chronic obstructive pulmonary disease, unspecified: Secondary | ICD-10-CM | POA: Diagnosis not present

## 2017-07-29 DIAGNOSIS — J9621 Acute and chronic respiratory failure with hypoxia: Secondary | ICD-10-CM | POA: Diagnosis present

## 2017-07-29 DIAGNOSIS — I130 Hypertensive heart and chronic kidney disease with heart failure and stage 1 through stage 4 chronic kidney disease, or unspecified chronic kidney disease: Secondary | ICD-10-CM | POA: Diagnosis present

## 2017-07-29 DIAGNOSIS — R140 Abdominal distension (gaseous): Secondary | ICD-10-CM | POA: Diagnosis present

## 2017-07-29 DIAGNOSIS — R188 Other ascites: Secondary | ICD-10-CM | POA: Diagnosis present

## 2017-07-29 DIAGNOSIS — Z923 Personal history of irradiation: Secondary | ICD-10-CM | POA: Diagnosis not present

## 2017-07-29 DIAGNOSIS — F17210 Nicotine dependence, cigarettes, uncomplicated: Secondary | ICD-10-CM | POA: Diagnosis present

## 2017-07-29 DIAGNOSIS — Z86711 Personal history of pulmonary embolism: Secondary | ICD-10-CM | POA: Diagnosis not present

## 2017-07-29 DIAGNOSIS — E43 Unspecified severe protein-calorie malnutrition: Secondary | ICD-10-CM

## 2017-07-29 DIAGNOSIS — R64 Cachexia: Secondary | ICD-10-CM | POA: Diagnosis present

## 2017-07-29 DIAGNOSIS — G894 Chronic pain syndrome: Secondary | ICD-10-CM | POA: Diagnosis not present

## 2017-07-29 DIAGNOSIS — Z79899 Other long term (current) drug therapy: Secondary | ICD-10-CM | POA: Diagnosis not present

## 2017-07-29 DIAGNOSIS — Z9981 Dependence on supplemental oxygen: Secondary | ICD-10-CM

## 2017-07-29 DIAGNOSIS — J90 Pleural effusion, not elsewhere classified: Secondary | ICD-10-CM | POA: Diagnosis present

## 2017-07-29 DIAGNOSIS — Z7951 Long term (current) use of inhaled steroids: Secondary | ICD-10-CM

## 2017-07-29 DIAGNOSIS — Z7952 Long term (current) use of systemic steroids: Secondary | ICD-10-CM

## 2017-07-29 DIAGNOSIS — Z85118 Personal history of other malignant neoplasm of bronchus and lung: Secondary | ICD-10-CM | POA: Diagnosis not present

## 2017-07-29 DIAGNOSIS — J9811 Atelectasis: Secondary | ICD-10-CM | POA: Diagnosis present

## 2017-07-29 DIAGNOSIS — R0600 Dyspnea, unspecified: Secondary | ICD-10-CM | POA: Diagnosis present

## 2017-07-29 DIAGNOSIS — D649 Anemia, unspecified: Secondary | ICD-10-CM | POA: Diagnosis present

## 2017-07-29 DIAGNOSIS — T451X5A Adverse effect of antineoplastic and immunosuppressive drugs, initial encounter: Secondary | ICD-10-CM | POA: Diagnosis present

## 2017-07-29 DIAGNOSIS — R079 Chest pain, unspecified: Secondary | ICD-10-CM | POA: Diagnosis not present

## 2017-07-29 DIAGNOSIS — Z809 Family history of malignant neoplasm, unspecified: Secondary | ICD-10-CM

## 2017-07-29 DIAGNOSIS — I5033 Acute on chronic diastolic (congestive) heart failure: Secondary | ICD-10-CM | POA: Diagnosis present

## 2017-07-29 LAB — CBC WITH DIFFERENTIAL/PLATELET
BASOS PCT: 0 %
Basophils Absolute: 0 10*3/uL (ref 0.0–0.1)
Eosinophils Absolute: 0 10*3/uL (ref 0.0–0.7)
Eosinophils Relative: 0 %
HCT: 24.7 % — ABNORMAL LOW (ref 39.0–52.0)
HEMOGLOBIN: 8.1 g/dL — AB (ref 13.0–17.0)
LYMPHS PCT: 3 %
Lymphs Abs: 0.3 10*3/uL — ABNORMAL LOW (ref 0.7–4.0)
MCH: 32.9 pg (ref 26.0–34.0)
MCHC: 32.8 g/dL (ref 30.0–36.0)
MCV: 100.4 fL — AB (ref 78.0–100.0)
MONO ABS: 0.2 10*3/uL (ref 0.1–1.0)
MONOS PCT: 2 %
NEUTROS ABS: 8.6 10*3/uL — AB (ref 1.7–7.7)
Neutrophils Relative %: 95 %
Platelets: 32 10*3/uL — ABNORMAL LOW (ref 150–400)
RBC: 2.46 MIL/uL — ABNORMAL LOW (ref 4.22–5.81)
RDW: 16.4 % — ABNORMAL HIGH (ref 11.5–15.5)
WBC: 9.1 10*3/uL (ref 4.0–10.5)
nRBC: 1 /100 WBC — ABNORMAL HIGH

## 2017-07-29 LAB — COMPREHENSIVE METABOLIC PANEL
ALK PHOS: 100 U/L (ref 38–126)
ALT: 16 U/L (ref 0–44)
ANION GAP: 9 (ref 5–15)
AST: 22 U/L (ref 15–41)
Albumin: 3.1 g/dL — ABNORMAL LOW (ref 3.5–5.0)
BILIRUBIN TOTAL: 0.2 mg/dL — AB (ref 0.3–1.2)
BUN: 26 mg/dL — ABNORMAL HIGH (ref 6–20)
CALCIUM: 9 mg/dL (ref 8.9–10.3)
CO2: 35 mmol/L — ABNORMAL HIGH (ref 22–32)
CREATININE: 1.59 mg/dL — AB (ref 0.61–1.24)
Chloride: 97 mmol/L — ABNORMAL LOW (ref 98–111)
GFR calc Af Amer: 54 mL/min — ABNORMAL LOW (ref >60)
GFR, EST NON AFRICAN AMERICAN: 47 mL/min — AB (ref >60)
Glucose, Bld: 156 mg/dL — ABNORMAL HIGH (ref 70–99)
Potassium: 4.6 mmol/L (ref 3.5–5.1)
Sodium: 141 mmol/L (ref 135–145)
TOTAL PROTEIN: 6.1 g/dL — AB (ref 6.5–8.1)

## 2017-07-29 LAB — CULTURE, BLOOD (ROUTINE X 2)
Culture: NO GROWTH
Culture: NO GROWTH
SPECIAL REQUESTS: ADEQUATE
SPECIAL REQUESTS: ADEQUATE

## 2017-07-29 LAB — TROPONIN I

## 2017-07-29 LAB — BRAIN NATRIURETIC PEPTIDE: B NATRIURETIC PEPTIDE 5: 59.2 pg/mL (ref 0.0–100.0)

## 2017-07-29 MED ORDER — GUAIFENESIN-DM 100-10 MG/5ML PO SYRP
5.0000 mL | ORAL_SOLUTION | Freq: Two times a day (BID) | ORAL | Status: DC
  Administered 2017-07-30: 5 mL via ORAL
  Filled 2017-07-29: qty 10

## 2017-07-29 MED ORDER — SODIUM CHLORIDE 0.9% FLUSH
3.0000 mL | Freq: Two times a day (BID) | INTRAVENOUS | Status: DC
  Administered 2017-07-30 – 2017-07-31 (×2): 3 mL via INTRAVENOUS

## 2017-07-29 MED ORDER — SENNOSIDES-DOCUSATE SODIUM 8.6-50 MG PO TABS
1.0000 | ORAL_TABLET | Freq: Two times a day (BID) | ORAL | Status: DC
  Administered 2017-07-30 (×3): 1 via ORAL
  Filled 2017-07-29 (×4): qty 1

## 2017-07-29 MED ORDER — LORATADINE 10 MG PO TABS
10.0000 mg | ORAL_TABLET | Freq: Every day | ORAL | Status: DC
  Filled 2017-07-29 (×2): qty 1

## 2017-07-29 MED ORDER — ONDANSETRON HCL 4 MG/2ML IJ SOLN
4.0000 mg | Freq: Four times a day (QID) | INTRAMUSCULAR | Status: DC | PRN
  Administered 2017-07-30: 4 mg via INTRAVENOUS
  Filled 2017-07-29: qty 2

## 2017-07-29 MED ORDER — FUROSEMIDE 40 MG PO TABS
40.0000 mg | ORAL_TABLET | Freq: Every day | ORAL | Status: DC
  Administered 2017-07-30 – 2017-07-31 (×2): 40 mg via ORAL
  Filled 2017-07-29 (×2): qty 1

## 2017-07-29 MED ORDER — GUAIFENESIN ER 600 MG PO TB12
1200.0000 mg | ORAL_TABLET | Freq: Two times a day (BID) | ORAL | Status: DC
  Administered 2017-07-30: 1200 mg via ORAL
  Filled 2017-07-29 (×2): qty 2

## 2017-07-29 MED ORDER — METHYLPREDNISOLONE SODIUM SUCC 125 MG IJ SOLR
125.0000 mg | Freq: Once | INTRAMUSCULAR | Status: AC
  Administered 2017-07-29: 125 mg via INTRAVENOUS
  Filled 2017-07-29: qty 2

## 2017-07-29 MED ORDER — IOPAMIDOL (ISOVUE-370) INJECTION 76%
100.0000 mL | Freq: Once | INTRAVENOUS | Status: AC | PRN
  Administered 2017-07-29: 100 mL via INTRAVENOUS

## 2017-07-29 MED ORDER — POLYETHYLENE GLYCOL 3350 17 G PO PACK
17.0000 g | PACK | Freq: Two times a day (BID) | ORAL | Status: DC
  Filled 2017-07-29 (×3): qty 1

## 2017-07-29 MED ORDER — IPRATROPIUM-ALBUTEROL 0.5-2.5 (3) MG/3ML IN SOLN
3.0000 mL | Freq: Four times a day (QID) | RESPIRATORY_TRACT | Status: DC
  Administered 2017-07-30: 3 mL via RESPIRATORY_TRACT
  Filled 2017-07-29: qty 3

## 2017-07-29 MED ORDER — ACETAMINOPHEN 650 MG RE SUPP: 650.0000 mg | Freq: Four times a day (QID) | RECTAL | Status: DC | PRN

## 2017-07-29 MED ORDER — ALBUTEROL (5 MG/ML) CONTINUOUS INHALATION SOLN
10.0000 mg/h | INHALATION_SOLUTION | Freq: Once | RESPIRATORY_TRACT | Status: AC
  Administered 2017-07-29: 10 mg/h via RESPIRATORY_TRACT
  Filled 2017-07-29: qty 20

## 2017-07-29 MED ORDER — ALBUTEROL SULFATE (2.5 MG/3ML) 0.083% IN NEBU
2.5000 mg | INHALATION_SOLUTION | RESPIRATORY_TRACT | Status: DC | PRN
Start: 2017-07-29 — End: 2017-07-31
  Administered 2017-07-30 – 2017-07-31 (×2): 2.5 mg via RESPIRATORY_TRACT
  Filled 2017-07-29 (×2): qty 3

## 2017-07-29 MED ORDER — FOLIC ACID 1 MG PO TABS
1.0000 mg | ORAL_TABLET | Freq: Every day | ORAL | Status: DC
  Administered 2017-07-30: 1 mg via ORAL
  Filled 2017-07-29 (×2): qty 1

## 2017-07-29 MED ORDER — IOPAMIDOL (ISOVUE-370) INJECTION 76%
INTRAVENOUS | Status: AC
  Filled 2017-07-29: qty 100

## 2017-07-29 MED ORDER — SODIUM CHLORIDE 0.9 % IV SOLN: 250.0000 mL | INTRAVENOUS | Status: DC | PRN

## 2017-07-29 MED ORDER — GABAPENTIN 100 MG PO CAPS
100.0000 mg | ORAL_CAPSULE | Freq: Three times a day (TID) | ORAL | Status: DC
  Administered 2017-07-30 – 2017-07-31 (×5): 100 mg via ORAL
  Filled 2017-07-29 (×5): qty 1

## 2017-07-29 MED ORDER — OXYCODONE HCL 5 MG PO TABS
10.0000 mg | ORAL_TABLET | ORAL | Status: DC | PRN
  Administered 2017-07-30 (×4): 10 mg via ORAL
  Filled 2017-07-29 (×4): qty 2

## 2017-07-29 MED ORDER — SODIUM CHLORIDE 0.9 % IV SOLN
INTRAVENOUS | Status: DC
  Administered 2017-07-29 – 2017-07-30 (×2): via INTRAVENOUS

## 2017-07-29 MED ORDER — METOPROLOL TARTRATE 25 MG PO TABS
12.5000 mg | ORAL_TABLET | Freq: Two times a day (BID) | ORAL | Status: DC
  Administered 2017-07-30 – 2017-07-31 (×4): 12.5 mg via ORAL
  Filled 2017-07-29 (×4): qty 1

## 2017-07-29 MED ORDER — SODIUM CHLORIDE 0.9% FLUSH: 3.0000 mL | INTRAVENOUS | Status: DC | PRN

## 2017-07-29 MED ORDER — ACETAMINOPHEN 325 MG PO TABS
650.0000 mg | ORAL_TABLET | Freq: Four times a day (QID) | ORAL | Status: DC | PRN
  Administered 2017-07-30: 650 mg via ORAL
  Filled 2017-07-29: qty 2

## 2017-07-29 MED ORDER — ONDANSETRON HCL 4 MG PO TABS: 4.0000 mg | ORAL_TABLET | Freq: Four times a day (QID) | ORAL | Status: DC | PRN

## 2017-07-29 MED ORDER — PANTOPRAZOLE SODIUM 40 MG PO TBEC
40.0000 mg | DELAYED_RELEASE_TABLET | Freq: Two times a day (BID) | ORAL | Status: DC
  Administered 2017-07-30 – 2017-07-31 (×4): 40 mg via ORAL
  Filled 2017-07-29 (×4): qty 1

## 2017-07-29 MED ORDER — ESCITALOPRAM OXALATE 10 MG PO TABS
5.0000 mg | ORAL_TABLET | Freq: Every day | ORAL | Status: DC
  Administered 2017-07-30 (×2): 5 mg via ORAL
  Filled 2017-07-29 (×2): qty 1

## 2017-07-29 MED ORDER — IPRATROPIUM BROMIDE 0.02 % IN SOLN
0.5000 mg | Freq: Once | RESPIRATORY_TRACT | Status: AC
  Administered 2017-07-29: 0.5 mg via RESPIRATORY_TRACT
  Filled 2017-07-29: qty 2.5

## 2017-07-29 NOTE — ED Notes (Signed)
Bed: WA04 Expected date:  Expected time:  Means of arrival:  Comments: EMS-SOB-non-rebreather

## 2017-07-29 NOTE — ED Notes (Signed)
Bed: WHALF Expected date:  Expected time:  Means of arrival:  Comments:
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
## 2017-07-29 NOTE — ED Triage Notes (Signed)
Per EMS: Pt from [facility]. SPO2 of 79, put on nonrebreather and O2 increased to 97.

## 2017-07-29 NOTE — ED Notes (Signed)
Transported via [employer]
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
## 2017-07-30 ENCOUNTER — Inpatient Hospital Stay (HOSPITAL_COMMUNITY): Payer: Medicaid Other

## 2017-07-30 DIAGNOSIS — J441 Chronic obstructive pulmonary disease with (acute) exacerbation: Principal | ICD-10-CM

## 2017-07-30 DIAGNOSIS — C3492 Malignant neoplasm of unspecified part of left bronchus or lung: Secondary | ICD-10-CM

## 2017-07-30 DIAGNOSIS — R0602 Shortness of breath: Secondary | ICD-10-CM

## 2017-07-30 DIAGNOSIS — J9621 Acute and chronic respiratory failure with hypoxia: Secondary | ICD-10-CM

## 2017-07-30 LAB — PROTIME-INR
INR: 1.02
Prothrombin Time: 13.3 seconds (ref 11.4–15.2)

## 2017-07-30 LAB — COMPREHENSIVE METABOLIC PANEL
ALK PHOS: 83 U/L (ref 38–126)
ALT: 14 U/L (ref 0–44)
ANION GAP: 8 (ref 5–15)
AST: 18 U/L (ref 15–41)
Albumin: 2.7 g/dL — ABNORMAL LOW (ref 3.5–5.0)
BILIRUBIN TOTAL: 0.6 mg/dL (ref 0.3–1.2)
BUN: 25 mg/dL — ABNORMAL HIGH (ref 6–20)
CALCIUM: 8.7 mg/dL — AB (ref 8.9–10.3)
CO2: 34 mmol/L — ABNORMAL HIGH (ref 22–32)
Chloride: 100 mmol/L (ref 98–111)
Creatinine, Ser: 1.45 mg/dL — ABNORMAL HIGH (ref 0.61–1.24)
GFR, EST NON AFRICAN AMERICAN: 52 mL/min — AB (ref >60)
GLUCOSE: 109 mg/dL — AB (ref 70–99)
Potassium: 4.4 mmol/L (ref 3.5–5.1)
Sodium: 142 mmol/L (ref 135–145)
TOTAL PROTEIN: 5.3 g/dL — AB (ref 6.5–8.1)

## 2017-07-30 LAB — TROPONIN I
TROPONIN I: 0.03 ng/mL — AB (ref <0.03)
Troponin I: 0.03 ng/mL (ref <0.03)
Troponin I: 0.03 ng/mL (ref <0.03)

## 2017-07-30 LAB — PHOSPHORUS: Phosphorus: 1.7 mg/dL — ABNORMAL LOW (ref 2.5–4.6)

## 2017-07-30 LAB — TYPE AND SCREEN
ABO/RH(D): O POS
Antibody Screen: NEGATIVE

## 2017-07-30 LAB — HEMOGLOBIN AND HEMATOCRIT, BLOOD
HEMATOCRIT: 23.1 % — AB (ref 39.0–52.0)
Hemoglobin: 7.5 g/dL — ABNORMAL LOW (ref 13.0–17.0)

## 2017-07-30 LAB — MAGNESIUM: MAGNESIUM: 2 mg/dL (ref 1.7–2.4)

## 2017-07-30 LAB — CBC
HCT: 21.3 % — ABNORMAL LOW (ref 39.0–52.0)
HEMOGLOBIN: 6.9 g/dL — AB (ref 13.0–17.0)
MCH: 32.5 pg (ref 26.0–34.0)
MCHC: 32.4 g/dL (ref 30.0–36.0)
MCV: 100.5 fL — ABNORMAL HIGH (ref 78.0–100.0)
Platelets: 27 10*3/uL — CL (ref 150–400)
RBC: 2.12 MIL/uL — AB (ref 4.22–5.81)
RDW: 16.6 % — ABNORMAL HIGH (ref 11.5–15.5)
WBC: 10.5 10*3/uL (ref 4.0–10.5)

## 2017-07-30 LAB — TSH: TSH: 0.257 u[IU]/mL — ABNORMAL LOW (ref 0.350–4.500)

## 2017-07-30 MED ORDER — DM-GUAIFENESIN ER 30-600 MG PO TB12
2.0000 | ORAL_TABLET | Freq: Two times a day (BID) | ORAL | Status: DC
  Administered 2017-07-30 (×2): 2 via ORAL
  Filled 2017-07-30 (×3): qty 2

## 2017-07-30 MED ORDER — ENSURE ENLIVE PO LIQD
237.0000 mL | Freq: Three times a day (TID) | ORAL | Status: DC
  Administered 2017-07-30 – 2017-07-31 (×3): 237 mL via ORAL

## 2017-07-30 MED ORDER — ORAL CARE MOUTH RINSE
15.0000 mL | Freq: Two times a day (BID) | OROMUCOSAL | Status: DC
  Administered 2017-07-30: 15 mL via OROMUCOSAL

## 2017-07-30 MED ORDER — LIDOCAINE HCL 1 % IJ SOLN
INTRAMUSCULAR | Status: AC
  Filled 2017-07-30: qty 20

## 2017-07-30 MED ORDER — HYDROXYZINE HCL 10 MG PO TABS
10.0000 mg | ORAL_TABLET | Freq: Three times a day (TID) | ORAL | Status: DC | PRN
  Administered 2017-07-30 (×2): 10 mg via ORAL
  Filled 2017-07-30 (×3): qty 1

## 2017-07-30 MED ORDER — IPRATROPIUM-ALBUTEROL 0.5-2.5 (3) MG/3ML IN SOLN
3.0000 mL | Freq: Three times a day (TID) | RESPIRATORY_TRACT | Status: DC
  Administered 2017-07-30 – 2017-07-31 (×5): 3 mL via RESPIRATORY_TRACT
  Filled 2017-07-30 (×6): qty 3

## 2017-07-30 MED ORDER — IPRATROPIUM-ALBUTEROL 0.5-2.5 (3) MG/3ML IN SOLN: 3.0000 mL | Freq: Four times a day (QID) | RESPIRATORY_TRACT | Status: DC

## 2017-07-30 MED ORDER — SODIUM CHLORIDE 0.9% FLUSH: 10.0000 mL | INTRAVENOUS | Status: DC | PRN

## 2017-07-30 NOTE — Care Management Note (Signed)
Case Management Note  Patient Details  Name: [name] MRN: [id number] Date of Birth: 1960/12/03  Subjective/Objective: From ALF-[facility].Active w/[facility] HHPT, & home 02-rep [place] already aware-will address patient concerns about wanting larger 02 travel tanks.                   Action/Plan:d/c plan return back to ALF w/HHC/home 02.   Expected Discharge Date:                  Expected Discharge Plan:  Assisted Living / Rest Home  In-House Referral:     Discharge planning Services  CM Consult  Post Acute Care Choice:  (S) Durable Medical Equipment, Home Health(home 02-[facility];HHPT) Choice offered to:  Patient  DME Arranged:    DME Agency:     HH Arranged:    [place] Agency:     Status of Service:     If discussed at [employer], dates discussed:    Additional Comments:  Dessa Phi, RN 07/30/2017, 3:01 PM

## 2017-07-30 NOTE — Progress Notes (Signed)
PROGRESS NOTE    ALCIDE [name]  PYK:[id number] DOB: 19-Feb-1960 DOA: 07/29/2017 PCP: [name], MD   Brief Narrative: [name] is a 57 y.o. male with medical history significant of stage 4 lung cancer, Copd on home o2, seizures, anemia, thrombocytopenia, chemotherapy-induced, Diastolic CHF, HTN, PE. He presented with dyspnea and is being treated for a COPD exacerbation.   Assessment & Plan:   Active Problems:   Adenocarcinoma of left lung, stage 4 (HCC)   HTN (hypertension)   COPD exacerbation (HCC)   CKD (chronic kidney disease), stage III (HCC)   Acute on chronic respiratory failure with hypoxia (HCC)   Dyspnea   History of pulmonary embolism   Recurrent pleural effusion on right   Normocytic anemia   Chronic diastolic (congestive) heart failure (HCC)   Thrombocytopenia (HCC)   Wheezing on exhalation   COPD exacerbation Chronic respiratory failure with hypoxia Improved today -Pulmonary recommendations: no steroids, continue Duonebs and Breo.  Acute on chronic anemia Normocytic. Likely from chronic disease. No evidence of acute bleeding. -Repeat H&H and if <7, 1 unit of PRBC  Thrombocytopenia Acute. Likely secondary to chemotherapy. Worsening. Not on heparin this admission. -Repeat CBC in AM  Abdominal distension Possible ascites. Trace fluid seen around the liver on RUQ ultrasound on 7/7 -US paracentesis w/ labs -LDH/Protein  History of pulmonary embolism On Eliquis as an outpatient but currently on hold. Repeat chest CT shows near dissolution of prior thrombus.  CKD stage III Baseline of around 1.4-1.6. Stable.  Chronic diastolic heart failure Grade 1 diastolic dysfunction -Continue Lasix   DVT prophylaxis: SCDs Code Status:   Code Status: DNR Family Communication: None at bedside Disposition Plan: Discharge back to ALF when medically stable.   Consultants:   Pulmonology  Procedures:   None  Antimicrobials:  None     Subjective: Dyspnea improved today.  Objective: Vitals:   07/30/17 0508 07/30/17 0902 07/30/17 1018 07/30/17 1345  BP: (!) 146/82  130/73 (!) 155/87  Pulse: 80 82 69 65  Resp: 18 18  17   Temp: 98.6 F (37 C)   97.9 F (36.6 C)  TempSrc: Oral   Oral  SpO2: 100% 99%  100%  Weight:      Height:        Intake/Output Summary (Last 24 hours) at 07/30/2017 1544 Last data filed at 07/30/2017 1308 Gross per 24 hour  Intake 2161.67 ml  Output 1350 ml  Net 811.67 ml   Filed Weights   07/29/17 1751 07/29/17 2344  Weight: 52.6 kg (116 lb) 52.9 kg (116 lb 10 oz)    Examination:  General exam: Appears calm and comfortable Respiratory system: Clear to auscultation. Respiratory effort normal. Cardiovascular system: S1 & S2 heard, RRR. No murmurs, rubs, gallops or clicks. Gastrointestinal system: Abdomen is nondistended, soft and nontender. Normal bowel sounds heard. Central nervous system: Alert and oriented. No focal neurological deficits. Extremities: No edema. No calf tenderness Skin: No cyanosis. No rashes Psychiatry: Judgement and insight appear normal. Mood & affect appropriate.     Data Reviewed: I have personally reviewed following labs and imaging studies  CBC: Recent Labs  Lab 07/23/17 2321  07/27/17 0150 07/28/17 0323 07/28/17 1126 07/29/17 1807 07/30/17 0630  WBC 4.4   < > 6.3 5.6 6.3 9.1 10.5  NEUTROABS 4.0  --   --   --   --  8.6*  --   HGB 7.0*   < > 9.0* 7.9* 7.8* 8.1* 6.9*  HCT 21.3*   < >  27.1* 24.0* 24.5* 24.7* 21.3*  MCV 98.6   < > 98.2 99.2 99.6 100.4* 100.5*  PLT 123*   < > 64* 44* 36* 32* 27*   < > = values in this interval not displayed.   Basic Metabolic Panel: Recent Labs  Lab 07/24/17 0409  07/28/17 0323 07/28/17 1126 07/28/17 2112 07/29/17 1807 07/30/17 0630  NA  --    < > 144 144 145 141 142  K  --    < > 2.9* 3.0* 3.8 4.6 4.4  CL  --    < > 95* 97* 98 97* 100  CO2  --    < > 40* 38* 39* 35* 34*  GLUCOSE  --    < > 113* 98  103* 156* 109*  BUN  --    < > 22* 21* 20 26* 25*  CREATININE  --    < > 1.47* 1.44* 1.56* 1.59* 1.45*  CALCIUM  --    < > 8.8* 8.8* 9.0 9.0 8.7*  MG 1.9  --   --   --   --   --  2.0  PHOS  --   --   --   --   --   --  1.7*   < > = values in this interval not displayed.   GFR: Estimated Creatinine Clearance: 42.1 mL/min (A) (by C-G formula based on SCr of 1.45 mg/dL (H)). Liver Function Tests: Recent Labs  Lab 07/27/17 1204 07/28/17 0323 07/29/17 1807 07/30/17 0630  AST 21 19 22 18   ALT 15 14 16 14   ALKPHOS 89 81 100 83  BILITOT 0.6 0.4 0.2* 0.6  PROT 6.0* 5.7* 6.1* 5.3*  ALBUMIN 3.1* 3.0* 3.1* 2.7*   Recent Labs  Lab 07/24/17 0203  LIPASE 22   No results for input(s): AMMONIA in the last 168 hours. Coagulation Profile: Recent Labs  Lab 07/24/17 0147 07/30/17 0630  INR 1.06 1.02   Cardiac Enzymes: Recent Labs  Lab 07/29/17 1807 07/30/17 0030 07/30/17 0630 07/30/17 1239  TROPONINI <0.03 0.03* 0.03* 0.03*   BNP (last 3 results) No results for input(s): PROBNP in the last 8760 hours. HbA1C: No results for input(s): HGBA1C in the last 72 hours. CBG: No results for input(s): GLUCAP in the last 168 hours. Lipid Profile: No results for input(s): CHOL, HDL, LDLCALC, TRIG, CHOLHDL, LDLDIRECT in the last 72 hours. Thyroid Function Tests: Recent Labs    07/30/17 0630  TSH 0.257*   Anemia Panel: No results for input(s): VITAMINB12, FOLATE, FERRITIN, TIBC, IRON, RETICCTPCT in the last 72 hours. Sepsis Labs: No results for input(s): PROCALCITON, LATICACIDVEN in the last 168 hours.  Recent Results (from the past 240 hour(s))  Culture, blood (routine x 2) Call MD if unable to obtain prior to antibiotics being given     Status: None   Collection Time: 07/24/17  2:50 AM  Result Value Ref Range Status   Specimen Description   Final    BLOOD LEFT HAND Performed at [facility] [address], [place], [place] [address]    Special Requests    Final    BOTTLES DRAWN AEROBIC AND ANAEROBIC Blood Culture adequate volume   Culture   Final    NO GROWTH 5 DAYS Performed at [facility], [place] [address], [place], [place] [address]    Report Status 07/29/2017 FINAL  Final  Culture, blood (routine x 2) Call MD if unable to obtain prior to antibiotics being given  Status: None   Collection Time: 07/24/17  2:55 AM  Result Value Ref Range Status   Specimen Description BLOOD RIGHT ARM  Final   Special Requests   Final    AEROBIC BOTTLE ONLY Blood Culture adequate volume Performed at [facility] [address] [place], [place] [address]    Culture   Final    NO GROWTH 5 DAYS Performed at [facility], [place] [address] [place], [place] [address]    Report Status 07/29/2017 FINAL  Final  Respiratory Panel by PCR     Status: None   Collection Time: 07/24/17  3:19 AM  Result Value Ref Range Status   Adenovirus NOT DETECTED NOT DETECTED Final   Coronavirus 229E NOT DETECTED NOT DETECTED Final   Coronavirus HKU1 NOT DETECTED NOT DETECTED Final   Coronavirus NL63 NOT DETECTED NOT DETECTED Final   Coronavirus OC43 NOT DETECTED NOT DETECTED Final   Metapneumovirus NOT DETECTED NOT DETECTED Final   Rhinovirus / Enterovirus NOT DETECTED NOT DETECTED Final   Influenza A NOT DETECTED NOT DETECTED Final   Influenza B NOT DETECTED NOT DETECTED Final   Parainfluenza Virus 1 NOT DETECTED NOT DETECTED Final   Parainfluenza Virus 2 NOT DETECTED NOT DETECTED Final   Parainfluenza Virus 3 NOT DETECTED NOT DETECTED Final   Parainfluenza Virus 4 NOT DETECTED NOT DETECTED Final   Respiratory Syncytial Virus NOT DETECTED NOT DETECTED Final   Bordetella pertussis NOT DETECTED NOT DETECTED Final   Chlamydophila pneumoniae NOT DETECTED NOT DETECTED Final   Mycoplasma pneumoniae NOT DETECTED NOT DETECTED Final    Comment: Performed at [facility], [place] [address], [place], [place] [address]  MRSA PCR Screening      Status: None   Collection Time: 07/24/17  4:50 AM  Result Value Ref Range Status   MRSA by PCR NEGATIVE NEGATIVE Final    Comment:        The GeneXpert MRSA Assay (FDA approved for NASAL specimens only), is one component of a comprehensive MRSA colonization surveillance program. It is not intended to diagnose MRSA infection nor to guide or monitor treatment for MRSA infections. Performed at [facility], [place] [address], [place], [place] [address]          Radiology Studies: Ct Angio Chest Pe W/cm &/or Wo Cm  Result Date: 07/29/2017 CLINICAL DATA:  Chronic dyspnea EXAM: CT ANGIOGRAPHY CHEST WITH CONTRAST TECHNIQUE: Multidetector CT imaging of the chest was performed using the standard protocol during bolus administration of intravenous contrast. Multiplanar CT image reconstructions and MIPs were obtained to evaluate the vascular anatomy. CONTRAST:  110mL ISOVUE-370 IOPAMIDOL (ISOVUE-370) INJECTION 76% COMPARISON:  Same day CXR, chest CT 06/30/2017 and 04/13/2017 FINDINGS: Cardiovascular: Partial dissolution of intraluminal nonocclusive pulmonary embolus at the bifurcation of the left main pulmonary artery since prior exam. No new pulmonary embolus is noted. Mediastinum/Nodes: Patent trachea and mainstem bronchi. No mediastinal or hilar lymphadenopathy. Lungs/Pleura: Stable moderate layering right pleural effusion. Centrilobular emphysema is noted, upper lobe predominant with spiculation in the left upper lobe extending to the left suprahilar region of the chest as before, largely unchanged in appearance since recent comparison and regressed since 04/13/2017 CT. There is compressive atelectasis at the right lung base secondary to the effusion. Upper Abdomen: No acute abnormality. Musculoskeletal: Relative heterogeneous sclerosis of the T12 vertebral body is unchanged. Review of the MIP images confirms the above findings. IMPRESSION: 1. Near complete dissolution of previously  noted nonocclusive pulmonary embolus  within the distal left main pulmonary artery. No new pulmonary embolus identified. 2. Moderate right pleural effusion with adjacent compressive atelectasis superimposed on COPD. Findings are similar to that of prior. 3. Redemonstration of spiculated opacity in the left upper lobe tracking to the left hilum, unchanged since prior and slightly smaller than on more remote study from March. Emphysema (ICD10-J43.9). Electronically Signed   By: Ashley Royalty M.D.   On: 07/29/2017 21:08   Dg Chest Port 1 View  Result Date: 07/29/2017 CLINICAL DATA:  Increasing shortness of breath. EXAM: PORTABLE CHEST 1 VIEW COMPARISON:  July 27, 2017 FINDINGS: Stable right Port-A-Cath. No pneumothorax. Stable left suprahilar nodularity. Bilateral effusions, right greater than left, both relatively small. No change in the cardiomediastinal silhouette. IMPRESSION: No change in right greater than left small effusions or left suprahilar nodularity. Electronically Signed   By: Dorise Bullion III M.D   On: 07/29/2017 18:11        Scheduled Meds: . dextromethorphan-guaiFENesin  2 tablet Oral BID  . escitalopram  5 mg Oral QHS  . feeding supplement (ENSURE ENLIVE)  237 mL Oral TID BM  . folic acid  1 mg Oral Daily  . furosemide  40 mg Oral Daily  . gabapentin  100 mg Oral TID  . ipratropium-albuterol  3 mL Nebulization TID  . loratadine  10 mg Oral Daily  . mouth rinse  15 mL Mouth Rinse BID  . metoprolol tartrate  12.5 mg Oral BID  . pantoprazole  40 mg Oral BID  . polyethylene glycol  17 g Oral BID  . senna-docusate  1 tablet Oral BID  . sodium chloride flush  3 mL Intravenous Q12H   Continuous Infusions: . sodium chloride 125 mL/hr at 07/30/17 0831  . sodium chloride       LOS: 1 day     Cordelia Poche, MD [facility] 07/30/2017, 3:44 PM Pager: [phone]  If 7PM-7AM, please contact night-coverage [email or web address] 07/30/2017, 3:44 PM

## 2017-07-30 NOTE — Consult Note (Signed)
Name: [name] MRN: [id number] DOB: 09/15/1960    ADMISSION DATE:  07/29/2017 CONSULTATION DATE:  07/30/2017  REFERRING MD :  TRH-Netty  CHIEF COMPLAINT:  SOB  BRIEF PATIENT DESCRIPTION: 57 year old with PMH of stage 4 adeno lung cancer, COPD on home O2 and chemotherapy induced thrombocytopenia who presents to the hospital with SOB.  Patient was in the hospital and discharged the day prior for a COPD exacerbation.  Patient arrived home and felt that SOB was still worse than before and returned to the hospital via EMS.  EMS noted that patient was hypoxemic in the 70% but did not note the Fio2.    History of PE on apixaban that is now on hold, unknown why.  Reports chronic cough with clear sputum production.  No hemoptysis.   CT of the chest that I reviewed myself showed resolution of PE with right sided moderate to small pleural effusion.  SIGNIFICANT EVENTS  7/9 admission to the hospital for SOB and hypoxemia  STUDIES:  7/9 CT of the chest that I reviewed myself showed resolution of PE with right sided moderate to small pleural effusion.  HISTORY OF PRESENT ILLNESS:  57 year old with PMH of stage 4 lung cancer, COPD on home O2 and chemotherapy induced thrombocytopenia who presents to the hospital with SOB.  Patient was in the hospital and discharged the day prior for a COPD exacerbation.  Patient arrived home and felt that SOB was still worse than before and returned to the hospital via EMS.  EMS noted that patient was hypoxemic in the 70% but did not note the Fio2.    History of PE on apixaban that is now on hold, unknown why.  Reports chronic cough with clear sputum production.  No hemoptysis.   CT of the chest that I reviewed myself showed resolution of PE with right sided moderate to small pleural effusion.   PAST MEDICAL HISTORY :   has a past medical history of Abdominal pain (06/04/2016), Adenocarcinoma of left lung, stage 4 ([place]) (05/02/2016), Alcohol abuse, Bronchitis due  to tobacco use ([place]), Cancer ([place]), COPD (chronic obstructive pulmonary disease) ([place]), Dehydration (06/04/2016), Encounter for antineoplastic chemotherapy (05/02/2016), Gastritis, Goals of care, counseling/discussion (05/02/2016), Hematemesis, HTN (hypertension) (10/30/2016), Recurrent upper respiratory infection (URI), Seizures ([place]) (05/2011), and Shortness of breath.  has a past surgical history that includes No past surgeries; IR FLUORO GUIDE PORT INSERTION RIGHT (05/13/2016); IR US Guide Vasc Access Right (05/13/2016); and Esophagogastroduodenoscopy (egd) with propofol (N/A, 12/17/2016). Prior to Admission medications   Medication Sig Start Date End Date Taking? Authorizing Provider  apixaban (ELIQUIS) 5 MG TABS tablet Take 1 tablet (5 mg total) by mouth 2 (two) times daily. Hold it for 3 days. start taking on 08/01/2017 07/28/17  Yes Lavina Hamman, MD  azithromycin [facility]) 250 MG tablet 1 tab po daily x 4 more days, zero refills 07/25/17  Yes Donne Hazel, MD  dexamethasone (DECADRON) 4 MG tablet Take 4 mg by mouth 2 (two) times daily. The day before, the day of, and the day after chemotherapy every three weeks   Yes [provider]  Dextromethorphan-guaiFENesin (TUSSIN DM) 10-100 MG/5ML liquid Take 5 mLs by mouth every 12 (twelve) hours.   Yes [provider]  escitalopram (LEXAPRO) 5 MG tablet Take 1 tablet (5 mg total) by mouth at bedtime. 04/17/17  Yes Rosita Fire, MD  feeding supplement, ENSURE ENLIVE, (ENSURE ENLIVE) LIQD Take 237 mLs by mouth 3 (three) times daily  between meals. 03/19/17  Yes Shelly Coss, MD  ferrous sulfate 325 (65 FE) MG tablet Take 1 tablet (325 mg total) by mouth 2 (two) times daily with a meal. 07/12/17  Yes Lavina Hamman, MD  Fluticasone-Umeclidin-Vilant (TRELEGY ELLIPTA) 100-62.5-25 MCG/INH AEPB Inhale 1 puff into the lungs daily. 05/31/17  Yes Aline August, MD  folic acid (FOLVITE) 1 MG tablet Take 1 tablet (1 mg total) by mouth  daily. 07/13/17  Yes Lavina Hamman, MD  furosemide (LASIX) 40 MG tablet Take 1 tablet (40 mg total) by mouth daily. 07/13/17  Yes Lavina Hamman, MD  gabapentin (NEURONTIN) 100 MG capsule Take 1 capsule (100 mg total) by mouth 3 (three) times daily. 07/12/17  Yes Lavina Hamman, MD  guaiFENesin (MUCINEX) 600 MG 12 hr tablet Take 1,200 mg by mouth 2 (two) times daily.    Yes [provider]  hydrOXYzine (ATARAX/VISTARIL) 10 MG tablet Take 1 tablet (10 mg total) by mouth 3 (three) times daily as needed for anxiety. 07/25/17  Yes Donne Hazel, MD  Ipratropium-Albuterol (COMBIVENT) 20-100 MCG/ACT AERS respimat Inhale 1 puff into the lungs every 6 (six) hours.   Yes [provider]  loratadine (CLARITIN) 10 MG tablet Take 10 mg by mouth daily.   Yes [provider]  metoprolol tartrate (LOPRESSOR) 25 MG tablet Take 12.5 mg by mouth 2 (two) times daily.    Yes [provider]  Multiple Vitamin (THERA-TABS PO) Take 1 tablet by mouth daily.   Yes [provider]  pantoprazole (PROTONIX) 40 MG tablet Take 1 tablet (40 mg total) by mouth 2 (two) times daily. 12/18/16  Yes Eugenie Filler, MD  predniSONE (DELTASONE) 10 MG tablet Take 1 tablet (10 mg total) by mouth daily. 07/25/17  Yes Donne Hazel, MD  Sennosides-Docusate Sodium (SENNA PLUS PO) Take 2 tablets by mouth 2 (two) times daily.    Yes [provider]  sucralfate (CARAFATE) 1 GM/10ML suspension Take 10 mLs (1 g total) by mouth 4 (four) times daily -  with meals and at bedtime. 07/12/17  Yes Lavina Hamman, MD  acetaminophen (TYLENOL) 500 MG tablet Take 1 tablet (500 mg total) by mouth 2 (two) times daily as needed for mild pain. 07/07/17   Florencia Reasons, MD  albuterol (PROVENTIL) (2.5 MG/3ML) 0.083% nebulizer solution 1 neb every 4-6 hours as needed for wheezing and shortness of breath 04/01/17   Parrett, Tammy S, NP  Chlorphen-Phenyleph-ASA (ALKA-SELTZER PLUS COLD PO) Take 2 tablets by mouth at  bedtime as needed (COUGH).    [provider]  ipratropium-albuterol (DUONEB) 0.5-2.5 (3) MG/3ML SOLN Take 3 mLs by nebulization 4 (four) times daily. 07/28/17   Lavina Hamman, MD  ondansetron (ZOFRAN-ODT) 4 MG disintegrating tablet Take 4 mg by mouth every 8 (eight) hours as needed for nausea or vomiting.    [provider]  Oxycodone HCl 10 MG TABS Take 1 tablet (10 mg total) by mouth every 4 (four) hours as needed for up to 15 doses (pain/shortness of breath). 07/13/17   McDonald, Mia A, PA-C  polyethylene glycol (MIRALAX / GLYCOLAX) packet Take 17 g by mouth daily. 07/29/17   Lavina Hamman, MD  potassium chloride (K-DUR) 10 MEQ tablet Take 2 tablets (20 mEq total) by mouth daily. 07/28/17   Lavina Hamman, MD  senna-docusate (SENOKOT-S) 8.6-50 MG tablet Take 2 tablets by mouth 2 (two) times daily. Patient taking differently: Take 1 tablet by mouth 2 (two) times daily.  07/12/17   Lavina Hamman, MD  vitamin B-12 (CYANOCOBALAMIN) 1000 MCG tablet Take 1 tablet (1,000 mcg total) by mouth daily. 07/28/17   Lavina Hamman, MD   No Known Allergies  FAMILY HISTORY:  family history includes Cancer in his father; Diabetes Mellitus II in his mother. SOCIAL HISTORY:  reports that he has been smoking cigarettes.  He has a 3.00 pack-year smoking history. He has never used smokeless tobacco. He reports that he does not drink alcohol or use drugs.  REVIEW OF SYSTEMS:   Constitutional: Negative for fever, chills, weight loss, malaise/fatigue and diaphoresis.  HENT: Negative for hearing loss, ear pain, nosebleeds, congestion, sore throat, neck pain, tinnitus and ear discharge.   Eyes: Negative for blurred vision, double vision, photophobia, pain, discharge and redness.  Respiratory: Negative for cough, hemoptysis, sputum production, shortness of breath, wheezing and stridor.   Cardiovascular: Negative for chest pain, palpitations, orthopnea, claudication, leg swelling and PND.    Gastrointestinal: Negative for heartburn, nausea, vomiting, abdominal pain, diarrhea, constipation, blood in stool and melena.  Genitourinary: Negative for dysuria, urgency, frequency, hematuria and flank pain.  Musculoskeletal: Negative for myalgias, back pain, joint pain and falls.  Skin: Negative for itching and rash.  Neurological: Negative for dizziness, tingling, tremors, sensory change, speech change, focal weakness, seizures, loss of consciousness, weakness and headaches.  Endo/Heme/Allergies: Negative for environmental allergies and polydipsia. Does not bruise/bleed easily.  SUBJECTIVE:   VITAL SIGNS: Temp:  [97.6 F (36.4 C)-98.6 F (37 C)] 98.6 F (37 C) (07/10 0508) Pulse Rate:  [78-101] 82 (07/10 0902) Resp:  [16-26] 18 (07/10 0902) BP: (122-167)/(69-91) 146/82 (07/10 0508) SpO2:  [96 %-100 %] 99 % (07/10 0902) Weight:  [116 lb (52.6 kg)-116 lb 10 oz (52.9 kg)] 116 lb 10 oz (52.9 kg) (07/09 2344)  PHYSICAL EXAMINATION: General:  Chronically ill appearing male, NAD Neuro:  Alert and interactive, moving all ext to command HEENT:  [place]/AT, PERRL, EOM-I and MMM Cardiovascular:  RRR, Nl S1/S2 and -M/R/G Lungs:  Distant BS diffusely Abdomen:  Soft, NT, ND and +BS Musculoskeletal:  -edema and -tenderness Skin:  Intact  Recent Labs  Lab 07/28/17 2112 07/29/17 1807 07/30/17 0630  NA 145 141 142  K 3.8 4.6 4.4  CL 98 97* 100  CO2 39* 35* 34*  BUN 20 26* 25*  CREATININE 1.56* 1.59* 1.45*  GLUCOSE 103* 156* 109*   Recent Labs  Lab 07/28/17 1126 07/29/17 1807 07/30/17 0630  HGB 7.8* 8.1* 6.9*  HCT 24.5* 24.7* 21.3*  WBC 6.3 9.1 10.5  PLT 36* 32* 27*   Ct Angio Chest Pe W/cm &/or Wo Cm  Result Date: 07/29/2017 CLINICAL DATA:  Chronic dyspnea EXAM: CT ANGIOGRAPHY CHEST WITH CONTRAST TECHNIQUE: Multidetector CT imaging of the chest was performed using the standard protocol during bolus administration of intravenous contrast. Multiplanar CT image reconstructions and  MIPs were obtained to evaluate the vascular anatomy. CONTRAST:  173mL ISOVUE-370 IOPAMIDOL (ISOVUE-370) INJECTION 76% COMPARISON:  Same day CXR, chest CT 06/30/2017 and 04/13/2017 FINDINGS: Cardiovascular: Partial dissolution of intraluminal nonocclusive pulmonary embolus at the bifurcation of the left main pulmonary artery since prior exam. No new pulmonary embolus is noted. Mediastinum/Nodes: Patent trachea and mainstem bronchi. No mediastinal or hilar lymphadenopathy. Lungs/Pleura: Stable moderate layering right pleural effusion. Centrilobular emphysema is noted, upper lobe predominant with spiculation in the left upper lobe extending to the left suprahilar region of the chest as before, largely unchanged in appearance since recent comparison and regressed since 04/13/2017 CT. There  is compressive atelectasis at the right lung base secondary to the effusion. Upper Abdomen: No acute abnormality. Musculoskeletal: Relative heterogeneous sclerosis of the T12 vertebral body is unchanged. Review of the MIP images confirms the above findings. IMPRESSION: 1. Near complete dissolution of previously noted nonocclusive pulmonary embolus within the distal left main pulmonary artery. No new pulmonary embolus identified. 2. Moderate right pleural effusion with adjacent compressive atelectasis superimposed on COPD. Findings are similar to that of prior. 3. Redemonstration of spiculated opacity in the left upper lobe tracking to the left hilum, unchanged since prior and slightly smaller than on more remote study from March. Emphysema (ICD10-J43.9). Electronically Signed   By: Ashley Royalty M.D.   On: 07/29/2017 21:08   Dg Chest Port 1 View  Result Date: 07/29/2017 CLINICAL DATA:  Increasing shortness of breath. EXAM: PORTABLE CHEST 1 VIEW COMPARISON:  July 27, 2017 FINDINGS: Stable right Port-A-Cath. No pneumothorax. Stable left suprahilar nodularity. Bilateral effusions, right greater than left, both relatively small. No change  in the cardiomediastinal silhouette. IMPRESSION: No change in right greater than left small effusions or left suprahilar nodularity. Electronically Signed   By: Dorise Bullion III M.D   On: 07/29/2017 18:11    ASSESSMENT / PLAN:  57 year old male with COPD presenting to the hospital with SOB and hypoxemia, likely a COPD exacerbation, right sided pleural effusion.  Also history of CHF.  PCCM consulted for management above.  Patient is a DNR.  Discussed with PCCM-NP.  Hypoxemia: chronic on 3L [place] at home, currently down to 3L   - Continue O2 for sat of 88-92%  - Ambulatory desaturation study to evaluate if O2 needs to be increased with ambulation, perhaps that is what happened at home  COPD:  - No wheezing do not recommend steroids  - Duonebs  - Add PRN albuterol  - Mucinex  - Add inhaled steroids (on TRELEGY ELLIPTA) at home, not formulary, so added breo  Pleural effusion: not th source of hypoxemia  - Diureses  - No thora  CHF:  - Diureses as able  - Per primary  Lung cancer, stage 4: may need to consider palliative care involvement  PCCM will sign off, please call back if needed.  Rush Farmer, M.D. [facility] Pulmonary/Critical Care Medicine. Pager: [phone]. After hours pager: [phone].  07/30/2017, 9:37 AM

## 2017-07-30 NOTE — Progress Notes (Signed)
CRITICAL VALUE ALERT  Critical Value:  Troponin 0.03  Date & Time Notied:  7/10 0230  Provider Notified: Baltazar Najjar, NP  Orders Received/Actions taken: continue to monitor

## 2017-07-30 NOTE — Progress Notes (Signed)
Initial Nutrition Assessment  DOCUMENTATION CODES:   Severe malnutrition in context of chronic illness, Underweight  INTERVENTION:  - Will order Ensure Enlive TID, each supplement provides 350 kcal and 20 grams of protein. - Will liberalize diet from Heart Healthy to Regular.  - Continue to encourage PO intakes.   NUTRITION DIAGNOSIS:   Severe Malnutrition related to chronic illness, catabolic illness, cancer and cancer related treatments as evidenced by severe muscle depletion, severe fat depletion.  GOAL:   Patient will meet greater than or equal to 90% of their needs  MONITOR:   PO intake, Supplement acceptance, Weight trends, Labs  REASON FOR ASSESSMENT:   Consult Malnutrition Eval  ASSESSMENT:   57 y.o. male with medical history significant of stage 4 lung cancer, COPD on home O2, seizures, anemia, thrombocytopenia, chemotherapy-induced, CHF, HTN, PE diagnosed in June, and CKD. Presented with SOB, wheezing, and cough. EMS was called he was noted to be hypoxic down to 70%.  No intakes documented since admission. Patient states he ate breakfast (cereal) and that he typically eats best for breakfast and that lunch and dinner intakes are not as good. Patient was admitted 7/6-7/8 during which time intakes were 75-90%. During previous recent hospitalizations patient was mainly eating 100% of large meals and drinking Ensure BID.   He states that he has had ongoing abdominal pain x10 months and expresses frustration that he believes no one is concerned about this or doing anything about it. He states that pain leads to "constant, every day" nausea and that this affects his desire to eat. He does not like the food at the nursing facility he was at but eats if "I have to." He does like Ensure and is very willing to consume these during admission.   He states that he has been prescribed "stuff to make me go to the bathroom but that is not the problem and that is not solving the  problem." When asked, patient denies ever having an issue with/experiencing constipation. Dr. Rueben Bash note from yesterday states "imaging showing large amount of stool, will order bowel regimen and see if that will improve abdominal discomfort."  NFPE outlined below. Per chart review, weight has been stable over the past 1 month.   Medications reviewed; 1 mg oral folic acid/day, 40 mg oral Lasix/day, 125 mg Solu-medrol x1 yesterday, 40 mg oral Protonix BID, 1 packet Miralax BID, 1 tablet Senokot BID.  Labs reviewed; BUN: 25 mg/dL, creatinine: 1.45 mg/dL, Ca: 8.7 mg/dL, Phos: 1.7 mg/dL. IVF: NS @ 125 mL/hr.     NUTRITION - FOCUSED PHYSICAL EXAM:    Most Recent Value  Orbital Region  Mild depletion  Upper Arm Region  Severe depletion  Thoracic and Lumbar Region  Severe depletion  Buccal Region  Moderate depletion  Temple Region  Moderate depletion  Clavicle Bone Region  Severe depletion  Clavicle and Acromion Bone Region  Severe depletion  Scapular Bone Region  Unable to assess  Dorsal Hand  Moderate depletion  Patellar Region  Moderate depletion  Anterior Thigh Region  Moderate depletion  Posterior Calf Region  Moderate depletion  Edema (RD Assessment)  Moderate [BLE]  Hair  Reviewed  Eyes  Reviewed  Mouth  Reviewed  Skin  Reviewed  Nails  Reviewed       Diet Order:   Diet Order           Diet regular Room service appropriate? Yes; Fluid consistency: Thin  Diet effective now  EDUCATION NEEDS:   No education needs have been identified at this time  Skin:  Skin Assessment: Reviewed RN Assessment  Last BM:  7/9  Height:   Ht Readings from Last 1 Encounters:  07/29/17 5\' 11"  (1.803 m)    Weight:   Wt Readings from Last 1 Encounters:  07/29/17 116 lb 10 oz (52.9 kg)    Ideal Body Weight:  78.18 kg  BMI:  Body mass index is 16.27 kg/m.  Estimated Nutritional Needs:   Kcal:  1850-2065 (35-39 kcal/kg)  Protein:  95-115 grams (1.8-2.2  grams/kg)  Fluid:  >/= 1.8 L/day     Jarome Matin, MS, RD, LDN, [facility] Inpatient Clinical Dietitian Pager # [phone] After hours/weekend pager # [phone]

## 2017-07-31 ENCOUNTER — Other Ambulatory Visit: Payer: Self-pay

## 2017-07-31 ENCOUNTER — Emergency Department (HOSPITAL_COMMUNITY)
Admission: EM | Admit: 2017-07-31 | Discharge: 2017-08-01 | Disposition: A | Discharge status: Home / Self Care | Payer: Medicaid Other | Attending: Emergency Medicine | Admitting: Emergency Medicine

## 2017-07-31 ENCOUNTER — Encounter (HOSPITAL_COMMUNITY): Payer: Self-pay | Admitting: Emergency Medicine

## 2017-07-31 ENCOUNTER — Inpatient Hospital Stay (HOSPITAL_COMMUNITY): Payer: Medicaid Other

## 2017-07-31 ENCOUNTER — Emergency Department (HOSPITAL_COMMUNITY): Payer: Medicaid Other

## 2017-07-31 DIAGNOSIS — Z85118 Personal history of other malignant neoplasm of bronchus and lung: Secondary | ICD-10-CM | POA: Insufficient documentation

## 2017-07-31 DIAGNOSIS — G894 Chronic pain syndrome: Secondary | ICD-10-CM | POA: Insufficient documentation

## 2017-07-31 DIAGNOSIS — J449 Chronic obstructive pulmonary disease, unspecified: Secondary | ICD-10-CM | POA: Insufficient documentation

## 2017-07-31 DIAGNOSIS — Z79899 Other long term (current) drug therapy: Secondary | ICD-10-CM | POA: Insufficient documentation

## 2017-07-31 DIAGNOSIS — I10 Essential (primary) hypertension: Secondary | ICD-10-CM | POA: Insufficient documentation

## 2017-07-31 DIAGNOSIS — E43 Unspecified severe protein-calorie malnutrition: Secondary | ICD-10-CM

## 2017-07-31 DIAGNOSIS — R079 Chest pain, unspecified: Secondary | ICD-10-CM | POA: Insufficient documentation

## 2017-07-31 DIAGNOSIS — F17210 Nicotine dependence, cigarettes, uncomplicated: Secondary | ICD-10-CM | POA: Insufficient documentation

## 2017-07-31 LAB — CBC WITH DIFFERENTIAL/PLATELET
BASOS ABS: 0 10*3/uL (ref 0.0–0.1)
Basophils Relative: 0 %
Eosinophils Absolute: 0 10*3/uL (ref 0.0–0.7)
Eosinophils Relative: 0 %
HEMATOCRIT: 24.3 % — AB (ref 39.0–52.0)
HEMOGLOBIN: 7.7 g/dL — AB (ref 13.0–17.0)
Lymphocytes Relative: 6 %
Lymphs Abs: 0.7 10*3/uL (ref 0.7–4.0)
MCH: 32.5 pg (ref 26.0–34.0)
MCHC: 31.7 g/dL (ref 30.0–36.0)
MCV: 102.5 fL — AB (ref 78.0–100.0)
MONO ABS: 0.6 10*3/uL (ref 0.1–1.0)
Monocytes Relative: 5 %
NEUTROS ABS: 10 10*3/uL — AB (ref 1.7–7.7)
Neutrophils Relative %: 89 %
Platelets: 45 10*3/uL — ABNORMAL LOW (ref 150–400)
RBC: 2.37 MIL/uL — ABNORMAL LOW (ref 4.22–5.81)
RDW: 16.6 % — AB (ref 11.5–15.5)
WBC: 11.4 10*3/uL — ABNORMAL HIGH (ref 4.0–10.5)

## 2017-07-31 LAB — CBC
HCT: 23.9 % — ABNORMAL LOW (ref 39.0–52.0)
HEMOGLOBIN: 7.6 g/dL — AB (ref 13.0–17.0)
MCH: 32.8 pg (ref 26.0–34.0)
MCHC: 31.8 g/dL (ref 30.0–36.0)
MCV: 103 fL — AB (ref 78.0–100.0)
Platelets: 38 10*3/uL — ABNORMAL LOW (ref 150–400)
RBC: 2.32 MIL/uL — ABNORMAL LOW (ref 4.22–5.81)
RDW: 16.8 % — ABNORMAL HIGH (ref 11.5–15.5)
WBC: 10.5 10*3/uL (ref 4.0–10.5)

## 2017-07-31 LAB — COMPREHENSIVE METABOLIC PANEL
ALBUMIN: 2.9 g/dL — AB (ref 3.5–5.0)
ALT: 20 U/L (ref 0–44)
ANION GAP: 6 (ref 5–15)
AST: 24 U/L (ref 15–41)
Alkaline Phosphatase: 89 U/L (ref 38–126)
BUN: 23 mg/dL — ABNORMAL HIGH (ref 6–20)
CHLORIDE: 98 mmol/L (ref 98–111)
CO2: 39 mmol/L — AB (ref 22–32)
Calcium: 9 mg/dL (ref 8.9–10.3)
Creatinine, Ser: 1.5 mg/dL — ABNORMAL HIGH (ref 0.61–1.24)
GFR calc Af Amer: 58 mL/min — ABNORMAL LOW (ref >60)
GFR calc non Af Amer: 50 mL/min — ABNORMAL LOW (ref >60)
GLUCOSE: 97 mg/dL (ref 70–99)
Potassium: 3.7 mmol/L (ref 3.5–5.1)
SODIUM: 143 mmol/L (ref 135–145)
Total Bilirubin: 0.4 mg/dL (ref 0.3–1.2)
Total Protein: 5.6 g/dL — ABNORMAL LOW (ref 6.5–8.1)

## 2017-07-31 LAB — LACTATE DEHYDROGENASE: LDH: 167 U/L (ref 98–192)

## 2017-07-31 LAB — TROPONIN I: Troponin I: 0.03 ng/mL (ref <0.03)

## 2017-07-31 MED ORDER — OXYCODONE-ACETAMINOPHEN 5-325 MG PO TABS
2.0000 | ORAL_TABLET | Freq: Once | ORAL | Status: AC
  Administered 2017-07-31: 2 via ORAL
  Filled 2017-07-31: qty 2

## 2017-07-31 MED ORDER — ALBUTEROL SULFATE (2.5 MG/3ML) 0.083% IN NEBU
5.0000 mg | INHALATION_SOLUTION | Freq: Once | RESPIRATORY_TRACT | Status: AC
  Administered 2017-07-31: 5 mg via RESPIRATORY_TRACT
  Filled 2017-07-31: qty 6

## 2017-07-31 MED ORDER — HEPARIN SOD (PORK) LOCK FLUSH 100 UNIT/ML IV SOLN
500.0000 [IU] | INTRAVENOUS | Status: AC | PRN
  Administered 2017-07-31: 500 [IU]

## 2017-07-31 MED ORDER — METOPROLOL TARTRATE 5 MG/5ML IV SOLN
5.0000 mg | Freq: Once | INTRAVENOUS | Status: AC
  Administered 2017-07-31: 5 mg via INTRAVENOUS
  Filled 2017-07-31: qty 5

## 2017-07-31 NOTE — Care Management Note (Signed)
Case Management Note  Patient Details  Name: [name] MRN: [id number] Date of Birth: 08-10-1960  Subjective/Objective: d/c home w/HHC-[facility] rep [facility] aware. Already has home 02 through [facility]. No further CM needs.                   Action/Plan:d/c back to ALF w/HHC.   Expected Discharge Date:  07/31/17               Expected Discharge Plan:  Assisted Living / Rest Home  In-House Referral:     Discharge planning Services  CM Consult  Post Acute Care Choice:  (S) Durable Medical Equipment, Home Health(home 02-[facility];HHPT) Choice offered to:  Patient  DME Arranged:    DME Agency:     HH Arranged:  PT [place]:  [place]  Status of Service:  Completed, signed off  If discussed at [place] of Stay Meetings, dates discussed:    Additional Comments:  Dessa Phi, RN 07/31/2017, 11:59 AM

## 2017-07-31 NOTE — ED Triage Notes (Addendum)
Pt from [name] retirement  Released from [place] this AM returning this PM with chest pain SOB . Scattered wheezing all lobes  Albuteral aerosal ineffective. Pt has extensive Hx of CA lungs with possible mets . PT is DNR

## 2017-07-31 NOTE — Progress Notes (Signed)
Patient returning to [name] ALF. Facility aware of patient's discharge and confirmed patient's ability to return. CSW sent clinical documents to ALF. PTAR contacted, patient aware and will notify family. Patient's RN can call report to [phone], packet complete. CSW signing off, no other needs identified at this time.  Abundio Miu, [place] Social Worker [facility] Cell#: [phone]

## 2017-07-31 NOTE — Discharge Instructions (Addendum)
Continue pain medications, follow up with your doctor to discuss further pain management options.

## 2017-07-31 NOTE — ED Notes (Signed)
Bed: OI78 Expected date:  Expected time:  Means of arrival:  Comments: 6M SOB-DC today

## 2017-07-31 NOTE — ED Provider Notes (Signed)
[facility] DEPT Provider Note   CSN: [id number] Arrival date & time: 07/31/17  1910     History   Chief Complaint Chief Complaint  Patient presents with  . Shortness of Breath  . Chest Pain    HPI [name] is a 57 y.o. male.  57 year old male brought in by EMS from nursing facility.  Patient was discharged from the hospital today for COPD exacerbation, arrived back at his nursing facility and took a nap, upon waking felt sweaty, short of breath and had emesis x1 episode.  Patient requested to come back to the emergency room at that time, was given an albuterol neb in route by EMS and states he has not had any relief.  Patient states that his abdomen is also distended x10 months and he thought while he was in the hospital he was supposed to have fluid drained off of his abdomen.  Pertinent history includes adenocarcinoma of the lung with multiple previous thoracentesis, last done 07/05/17. No other complaints or concerns at this time.     Past Medical History:  Diagnosis Date  . Abdominal pain 06/04/2016  . Adenocarcinoma of left lung, stage 4 ([place]) 05/02/2016  . Alcohol abuse   . Bronchitis due to tobacco use ([place])   . Cancer ([place])    Lung  . COPD (chronic obstructive pulmonary disease) ([place])    on home o2 2LNC   . Dehydration 06/04/2016  . Encounter for antineoplastic chemotherapy 05/02/2016  . Gastritis   . Goals of care, counseling/discussion 05/02/2016  . Hematemesis   . HTN (hypertension) 10/30/2016  . Recurrent upper respiratory infection (URI)   . Seizures ([place]) 05/2011   new onset  . Shortness of breath     Patient Active Problem List   Diagnosis Date Noted  . Protein-calorie malnutrition, severe 07/31/2017  . Thrombocytopenia ([place]) 07/29/2017  . Wheezing on exhalation 07/29/2017  . Anemia 07/24/2017  . Normocytic anemia 07/24/2017  . Chronic diastolic (congestive) heart failure ([place]) 07/24/2017  . Recurrent pleural  effusion on right 07/15/2017  . Acute on chronic respiratory failure ([place]) 07/11/2017  . History of pulmonary embolism 07/11/2017  . PE (pulmonary thromboembolism) ([place]) 06/30/2017  . Pulmonary embolism ([place]) 06/30/2017  . AKI (acute kidney injury) ([place]) 06/25/2017  . Dyspnea 06/21/2017  . Chronic combined systolic (congestive) and diastolic (congestive) heart failure ([place])   . Constipation 06/07/2017  . Pain   . Acute on chronic respiratory failure with hypoxia ([place]) 05/28/2017  . Chronic respiratory failure with hypoxia ([place]) 04/01/2017  . Leucocytosis 03/16/2017  . CKD (chronic kidney disease), stage III ([place]) 03/12/2017  . Malnutrition of moderate degree 03/10/2017  . COPD exacerbation ([place]) 03/09/2017  . Macrocytic anemia 02/07/2017  . Severe malnutrition ([place]) 01/03/2017  . DOE (dyspnea on exertion) 12/30/2016  . Nausea   . Hypervolemia   . HCAP (healthcare-associated pneumonia) 12/19/2016  . SOB (shortness of breath) 12/19/2016  . Erosive gastropathy 12/18/2016  . Gastritis 12/18/2016  . Hematemesis 12/16/2016  . Intractable nausea and vomiting 12/16/2016  . HTN (hypertension) 10/30/2016  . Chronic obstructive pulmonary disease ([place]) 09/02/2016  . COPD (chronic obstructive pulmonary disease) ([place]) 08/19/2016  . Dehydration 06/04/2016  . Abdominal pain 06/04/2016  . 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 Adenocarcinoma of left lung, stage 4 ([place]) 05/02/2016  . Goals of care, counseling/discussion 05/02/2016  . Encounter for antineoplastic chemotherapy 05/02/2016  . Tobacco abuse 05/30/2011  . Alcohol abuse 05/30/2011  . Seizure ([place]) 05/28/2011  Past Surgical History:  Procedure Laterality Date  . ESOPHAGOGASTRODUODENOSCOPY (EGD) WITH PROPOFOL N/A 12/17/2016   Procedure: ESOPHAGOGASTRODUODENOSCOPY (EGD) WITH PROPOFOL;  Surgeon: Ladene Artist, MD;  Location: [facility] ENDOSCOPY;  Service: Endoscopy;  Laterality: N/A;  . IR FLUORO GUIDE PORT INSERTION RIGHT  05/13/2016  .  IR US GUIDE VASC ACCESS RIGHT  05/13/2016  . NO PAST SURGERIES          Home Medications    Prior to Admission medications   Medication Sig Start Date End Date Taking? Authorizing Provider  albuterol (PROVENTIL) (2.5 MG/3ML) 0.083% nebulizer solution 1 neb every 4-6 hours as needed for wheezing and shortness of breath Patient taking differently: Take 2.5 mg by nebulization every 6 (six) hours as needed for wheezing or shortness of breath.  04/01/17  Yes Parrett, Fonnie Mu, NP  apixaban (ELIQUIS) 5 MG TABS tablet Take 5 mg by mouth 2 (two) times daily.   Yes [provider]  Dextromethorphan-guaiFENesin (TUSSIN DM) 10-100 MG/5ML liquid Take 5 mLs by mouth every 12 (twelve) hours.   Yes [provider]  escitalopram (LEXAPRO) 5 MG tablet Take 1 tablet (5 mg total) by mouth at bedtime. 04/17/17  Yes Rosita Fire, MD  feeding supplement, ENSURE ENLIVE, (ENSURE ENLIVE) LIQD Take 237 mLs by mouth 3 (three) times daily between meals. 03/19/17  Yes Shelly Coss, MD  ferrous sulfate 325 (65 FE) MG tablet Take 1 tablet (325 mg total) by mouth 2 (two) times daily with a meal. 07/12/17  Yes Lavina Hamman, MD  folic acid (FOLVITE) 1 MG tablet Take 1 tablet (1 mg total) by mouth daily. 07/13/17  Yes Lavina Hamman, MD  furosemide (LASIX) 40 MG tablet Take 1 tablet (40 mg total) by mouth daily. 07/13/17  Yes Lavina Hamman, MD  gabapentin (NEURONTIN) 100 MG capsule Take 1 capsule (100 mg total) by mouth 3 (three) times daily. 07/12/17  Yes Lavina Hamman, MD  guaiFENesin (MUCINEX) 600 MG 12 hr tablet Take 1,200 mg by mouth 2 (two) times daily.    Yes [provider]  hydrOXYzine (ATARAX/VISTARIL) 10 MG tablet Take 1 tablet (10 mg total) by mouth 3 (three) times daily as needed for anxiety. 07/25/17  Yes Donne Hazel, MD  Ipratropium-Albuterol (COMBIVENT) 20-100 MCG/ACT AERS respimat Inhale 1 puff into the lungs every 6 (six) hours.   Yes [provider]    loratadine (CLARITIN) 10 MG tablet Take 10 mg by mouth daily.   Yes [provider]  Multiple Vitamin (THERA-TABS PO) Take 1 tablet by mouth daily.   Yes [provider]  Oxycodone HCl 10 MG TABS Take 1 tablet (10 mg total) by mouth every 4 (four) hours as needed for up to 15 doses (pain/shortness of breath). 07/13/17  Yes McDonald, Mia A, PA-C  pantoprazole (PROTONIX) 40 MG tablet Take 1 tablet (40 mg total) by mouth 2 (two) times daily. 12/18/16  Yes Eugenie Filler, MD  senna-docusate (SENOKOT-S) 8.6-50 MG tablet Take 2 tablets by mouth 2 (two) times daily. Patient taking differently: Take 1 tablet by mouth 2 (two) times daily.  07/12/17  Yes Lavina Hamman, MD  Sennosides-Docusate Sodium (SENNA PLUS PO) Take 2 tablets by mouth 2 (two) times daily.    Yes [provider]  acetaminophen (TYLENOL) 500 MG tablet Take 1 tablet (500 mg total) by mouth 2 (two) times daily as needed for mild pain. 07/07/17   Florencia Reasons, MD  Chlorphen-Phenyleph-ASA (ALKA-SELTZER PLUS COLD PO) Take  2 tablets by mouth at bedtime as needed (COUGH).    [provider]  dexamethasone (DECADRON) 4 MG tablet Take 4 mg by mouth 2 (two) times daily. The day before, the day of, and the day after chemotherapy every three weeks    [provider]  Fluticasone-Umeclidin-Vilant (TRELEGY ELLIPTA) 100-62.5-25 MCG/INH AEPB Inhale 1 puff into the lungs daily. 05/31/17   Aline August, MD  ipratropium-albuterol (DUONEB) 0.5-2.5 (3) MG/3ML SOLN Take 3 mLs by nebulization 4 (four) times daily. 07/28/17   Lavina Hamman, MD  metoprolol tartrate (LOPRESSOR) 25 MG tablet Take 12.5 mg by mouth 2 (two) times daily.     [provider]  ondansetron (ZOFRAN-ODT) 4 MG disintegrating tablet Take 4 mg by mouth every 8 (eight) hours as needed for nausea or vomiting.    [provider]  polyethylene glycol (MIRALAX / GLYCOLAX) packet Take 17 g by mouth daily. 07/29/17   Lavina Hamman, MD   potassium chloride (K-DUR) 10 MEQ tablet Take 2 tablets (20 mEq total) by mouth daily. 07/28/17   Lavina Hamman, MD  sucralfate (CARAFATE) 1 GM/10ML suspension Take 10 mLs (1 g total) by mouth 4 (four) times daily -  with meals and at bedtime. 07/12/17   Lavina Hamman, MD  vitamin B-12 (CYANOCOBALAMIN) 1000 MCG tablet Take 1 tablet (1,000 mcg total) by mouth daily. 07/28/17   Lavina Hamman, MD    Family History Family History  Problem Relation Age of Onset  . Cancer Father   . Diabetes Mellitus II Mother     Social History Social History   Tobacco Use  . Smoking status: Current Some Day Smoker    Packs/day: 0.10    Years: 30.00    Pack years: 3.00    Types: Cigarettes  . Smokeless tobacco: Never Used  . Tobacco comment: 1 cig a week  Substance Use Topics  . Alcohol use: No    Frequency: Never  . Drug use: No     Allergies   Patient has no known allergies.   Review of Systems Review of Systems  Constitutional: Positive for diaphoresis. Negative for chills and fever.  Respiratory: Positive for shortness of breath and wheezing.   Cardiovascular: Negative for chest pain.  Gastrointestinal: Positive for abdominal distention, abdominal pain, nausea and vomiting. Negative for constipation and diarrhea.  Genitourinary: Negative for difficulty urinating.  Musculoskeletal: Negative for arthralgias and myalgias.  Skin: Negative for rash and wound.  Allergic/Immunologic: Positive for immunocompromised state.  Neurological: Negative for dizziness and weakness.  Hematological: Does not bruise/bleed easily.  Psychiatric/Behavioral: Negative for confusion.  All other systems reviewed and are negative.    Physical Exam Updated Vital Signs BP (!) 123/92   Pulse 90   Temp 98.8 F (37.1 C) (Oral)   Resp (!) 25   SpO2 100%   Physical Exam  Constitutional: He is oriented to person, place, and time. He appears well-developed and well-nourished.  Non-toxic appearance. He does  not appear ill. No distress.  HENT:  Head: Normocephalic and atraumatic.  Cardiovascular: Normal rate, regular rhythm and normal heart sounds.  Pulmonary/Chest: Effort normal. He has decreased breath sounds in the right lower field and the left lower field. He has wheezes in the right upper field and the left upper field.  Abdominal: Bowel sounds are normal. He exhibits distension. There is tenderness.  Musculoskeletal:       Right lower leg: He exhibits edema. He exhibits no tenderness.  Left lower leg: He exhibits edema. He exhibits no tenderness.  Neurological: He is alert and oriented to person, place, and time.  Skin: Skin is warm and dry. No rash noted.  Psychiatric: He has a normal mood and affect. His behavior is normal.  Nursing note and vitals reviewed.    ED Treatments / Results  Labs (all labs ordered are listed, but only abnormal results are displayed) Labs Reviewed  COMPREHENSIVE METABOLIC PANEL - Abnormal; Notable for the following components:      Result Value   CO2 39 (*)    BUN 23 (*)    Creatinine, Ser 1.50 (*)    Total Protein 5.6 (*)    Albumin 2.9 (*)    GFR calc non Af Amer 50 (*)    GFR calc Af Amer 58 (*)    All other components within normal limits  CBC WITH DIFFERENTIAL/PLATELET - Abnormal; Notable for the following components:   WBC 11.4 (*)    RBC 2.37 (*)    Hemoglobin 7.7 (*)    HCT 24.3 (*)    MCV 102.5 (*)    RDW 16.6 (*)    Platelets 45 (*)    Neutro Abs 10.0 (*)    All other components within normal limits  TROPONIN I - Abnormal; Notable for the following components:   Troponin I 0.03 (*)    All other components within normal limits    EKG None  Radiology Dg Chest 2 View  Result Date: 07/31/2017 CLINICAL DATA:  Initial evaluation for acute generalized chest pain, shortness of breath. EXAM: CHEST - 2 VIEW COMPARISON:  Prior radiograph and CT from 07/29/2017 FINDINGS: Right-sided Port-A-Cath in place with tip in the proximal  right atrium. Cardiac and mediastinal silhouettes are stable, and remain within normal limits. Lungs normally inflated. Persistent right greater than left layering pleural effusions, stable. Associated right basilar opacity also unchanged. Left suprahilar nodular density is unchanged. No new focal airspace disease. No pneumothorax. Underlying emphysema noted. Osseous structures unchanged. IMPRESSION: 1. Stable appearance of the chest with small right greater than left layering pleural effusions with associated right basilar opacity, likely atelectasis. 2. Underlying emphysema. Electronically Signed   By: Jeannine Boga M.D.   On: 07/31/2017 20:23   US Abdomen Limited  Result Date: 07/31/2017 CLINICAL DATA:  57 year old male with abdominal distension. Assess for ascites. EXAM: LIMITED ABDOMEN ULTRASOUND FOR ASCITES TECHNIQUE: Limited ultrasound survey for ascites was performed in all four abdominal quadrants. COMPARISON:  Prior abdominal ultrasound 07/27/2017 FINDINGS: Sonographic evaluation of the 4 quadrants of the abdomen demonstrates trace ascites. There is insufficient fluid for paracentesis. IMPRESSION: Trace ascites, insufficient for paracentesis. Electronically Signed   By: Jacqulynn Cadet M.D.   On: 07/31/2017 11:56    Procedures Procedures (including critical care time)  Medications Ordered in ED Medications  oxyCODONE-acetaminophen (PERCOCET/ROXICET) 5-325 MG per tablet 2 tablet (has no administration in time range)  albuterol (PROVENTIL) (2.5 MG/3ML) 0.083% nebulizer solution 5 mg (5 mg Nebulization Given 07/31/17 1958)     Initial Impression / Assessment and Plan / ED Course  I have reviewed the triage vital signs and the nursing notes.  Pertinent labs & imaging results that were available during my care of the patient were reviewed by me and considered in my medical decision making (see chart for details).  Clinical Course as of Aug 01 2250  Thu Jul 31, 2017  2240 57 yo  male recently dc from the hospital today for COPD exacerbation, hx of  lung cancer. Returned to care facility, took a nap, woke up with [facility] and sweating, nausea/vomiting x 1 episode.  Reassessed patient, states he is not any better in any way- ongoing pain, no improvement in breathing. Labs unchanged from yesterday/baseline. Discussed with Dr. Venora Maples who has seen the patient. Patient given Percocet and can return to care facility where he has medications to manage his pain.   [LM]    Clinical Course User Index [LM] Tacy Learn, PA-C    Final Clinical Impressions(s) / ED Diagnoses   Final diagnoses:  Chronic pain syndrome    ED Discharge Orders    None       Roque Lias 07/31/17 2252    Jola Schmidt, MD 07/31/17 2349

## 2017-07-31 NOTE — Progress Notes (Signed)
Report given to [place] at [facility]. Pt will be transported via [facility].

## 2017-07-31 NOTE — ED Notes (Signed)
Right chest port accessed per protocal without difficulty 4cc wasted drawn, then labs obtained and the site was flushed with 10cc NSS. Transparent dressing applied

## 2017-07-31 NOTE — Progress Notes (Signed)
Patient ID: [name], male   DOB: 02/19/1960, 57 y.o.   MRN: [id number] Pt presented to [place] dept today for paracentesis. On limited US abd in all four quadrants there is no significant ascites noted. Procedure was cancelled. Pt informed.

## 2017-07-31 NOTE — Discharge Summary (Signed)
Physician Discharge Summary  [name] WUJ:[id number] DOB: 06/29/1960 DOA: 07/29/2017  PCP: Johnathan Joe, MD  Admit date: 07/29/2017 Discharge date: 07/31/2017  Admitted From: ALF Disposition: ALF  Recommendations for Outpatient Follow-up:  1. Follow up with PCP in 1 week (discussed for patient to follow-up with assigned PCP) 2. Follow up with pulmonologist 3. Please follow up on the following pending results: None  Home Health: PT Equipment/Devices: None  Discharge Condition: Stable CODE STATUS: DNR Diet recommendation: Heart healthy   Brief/Interim Summary:  Admission HPI written by Johnathan Baker, MD   HPI: [name] is a 57 y.o. male with medical history significant of stage 4 lung cancer, Copd on home o2, seizures, anemia, thrombocytopenia, chemotherapy-induced, Diastolic CHF, HTN, PE diagnosed in June, CKD       Presented with   Shortness of breath and  Wheezing, reports cough. EMS was called he was noted to be hypoxic down to 70%. Patient was admitted on 27 July 2017 for COPD exacerbation and discharged home yesterday.  While hospitalized hospitalization was noted to be thrombocytopenic with platelets down to 46 for to be secondary to chemotherapy patient has known history of pulmonary embolism on apixaban Dr. Julien [name] recommended holding it for at least 3 days.  He reports today he was short of breath again but he does not want to talk about it. It makes him angry that we are all asking the same questions.  Reports chest pain, states he quit smoking States he never felt better when he got out of the hospital He was discharged on prednisone.      Hospital course:  COPD exacerbation Chronic respiratory failure with hypoxia Improved. Continue home regimen. Continue steroid dosing. Pulmonology evaluated and recommended no steroid treatment.  Acute on chronic anemia Normocytic. Likely from chronic disease. No evidence of acute bleeding.  Stable.  Thrombocytopenia Acute. Likely secondary to chemotherapy. Not on heparin this admission. Stable.  Abdominal distension Likely secondary to increased stool. No ascites on ultrasound.  History of pulmonary embolism On Eliquis as an outpatient but currently on hold. Repeat chest CT shows near dissolution of prior thrombus. Hold Eliquis in setting of thrombocytopenia. Outpatient follow-up with heme/onc for possible resumption of Eliquis  CKD stage III Baseline of around 1.4-1.6. Stable.  Chronic diastolic heart failure Grade 1 diastolic dysfunction. Continued lasix   Discharge Diagnoses:  Active Problems:   Adenocarcinoma of left lung, stage 4 (HCC)   HTN (hypertension)   COPD exacerbation (HCC)   CKD (chronic kidney disease), stage III (HCC)   Acute on chronic respiratory failure with hypoxia (HCC)   Dyspnea   History of pulmonary embolism   Recurrent pleural effusion on right   Normocytic anemia   Chronic diastolic (congestive) heart failure (HCC)   Thrombocytopenia (HCC)   Wheezing on exhalation   Protein-calorie malnutrition, severe    Discharge Instructions  Discharge Instructions    Increase activity slowly   Complete by:  As directed      Allergies as of 07/31/2017   No Known Allergies     Medication List    STOP taking these medications   apixaban 5 MG Tabs tablet Commonly known as:  ELIQUIS   azithromycin 250 MG tablet Commonly known as:  ZITHROMAX   predniSONE 10 MG tablet Commonly known as:  DELTASONE     TAKE these medications   acetaminophen 500 MG tablet Commonly known as:  TYLENOL Take 1 tablet (500 mg total) by mouth 2 (two) times  daily as needed for mild pain.   albuterol (2.5 MG/3ML) 0.083% nebulizer solution Commonly known as:  PROVENTIL 1 neb every 4-6 hours as needed for wheezing and shortness of breath   ALKA-SELTZER PLUS COLD PO Take 2 tablets by mouth at bedtime as needed (COUGH).   dexamethasone 4 MG  tablet Commonly known as:  DECADRON Take 4 mg by mouth 2 (two) times daily. The day before, the day of, and the day after chemotherapy every three weeks   escitalopram 5 MG tablet Commonly known as:  LEXAPRO Take 1 tablet (5 mg total) by mouth at bedtime.   feeding supplement (ENSURE ENLIVE) Liqd Take 237 mLs by mouth 3 (three) times daily between meals.   ferrous sulfate 325 (65 FE) MG tablet Take 1 tablet (325 mg total) by mouth 2 (two) times daily with a meal.   Fluticasone-Umeclidin-Vilant 100-62.5-25 MCG/INH Aepb Commonly known as:  TRELEGY ELLIPTA Inhale 1 puff into the lungs daily.   folic acid 1 MG tablet Commonly known as:  FOLVITE Take 1 tablet (1 mg total) by mouth daily.   furosemide 40 MG tablet Commonly known as:  LASIX Take 1 tablet (40 mg total) by mouth daily.   gabapentin 100 MG capsule Commonly known as:  NEURONTIN Take 1 capsule (100 mg total) by mouth 3 (three) times daily.   hydrOXYzine 10 MG tablet Commonly known as:  ATARAX/VISTARIL Take 1 tablet (10 mg total) by mouth 3 (three) times daily as needed for anxiety.   Ipratropium-Albuterol 20-100 MCG/ACT Aers respimat Commonly known as:  COMBIVENT Inhale 1 puff into the lungs every 6 (six) hours.   ipratropium-albuterol 0.5-2.5 (3) MG/3ML Soln Commonly known as:  DUONEB Take 3 mLs by nebulization 4 (four) times daily.   loratadine 10 MG tablet Commonly known as:  CLARITIN Take 10 mg by mouth daily.   metoprolol tartrate 25 MG tablet Commonly known as:  LOPRESSOR Take 12.5 mg by mouth 2 (two) times daily.   MUCINEX 600 MG 12 hr tablet Generic drug:  guaiFENesin Take 1,200 mg by mouth 2 (two) times daily.   ondansetron 4 MG disintegrating tablet Commonly known as:  ZOFRAN-ODT Take 4 mg by mouth every 8 (eight) hours as needed for nausea or vomiting.   Oxycodone HCl 10 MG Tabs Take 1 tablet (10 mg total) by mouth every 4 (four) hours as needed for up to 15 doses (pain/shortness of  breath).   pantoprazole 40 MG tablet Commonly known as:  PROTONIX Take 1 tablet (40 mg total) by mouth 2 (two) times daily.   polyethylene glycol packet Commonly known as:  MIRALAX / GLYCOLAX Take 17 g by mouth daily.   potassium chloride 10 MEQ tablet Commonly known as:  K-DUR Take 2 tablets (20 mEq total) by mouth daily.   SENNA PLUS PO Take 2 tablets by mouth 2 (two) times daily. What changed:  Another medication with the same name was changed. Make sure you understand how and when to take each.   senna-docusate 8.6-50 MG tablet Commonly known as:  Senokot-S Take 2 tablets by mouth 2 (two) times daily. What changed:  how much to take   sucralfate 1 GM/10ML suspension Commonly known as:  CARAFATE Take 10 mLs (1 g total) by mouth 4 (four) times daily -  with meals and at bedtime.   THERA-TABS PO Take 1 tablet by mouth daily.   TUSSIN DM 10-100 MG/5ML liquid Generic drug:  Dextromethorphan-guaiFENesin Take 5 mLs by mouth every 12 (twelve) hours.  vitamin B-12 1000 MCG tablet Commonly known as:  CYANOCOBALAMIN Take 1 tablet (1,000 mcg total) by mouth daily.      Follow-up Information    [facility] Follow up.   Specialty:  [place] Why:  [facility] physical therapy Contact information: [address] [place] [address] [phone]        PCP from [facility] card Follow up.   Why:  Please make an appointment with your appointed primary care physician       Curt Bears, MD Follow up in 1 week(s).   Specialty:  Oncology Why:  Follow-up of pulmonary embolism and anticoagulation Contact information: [place] [address] [phone]          No Known Allergies  Consultations:  Pulmonology   Procedures/Studies: Ct Abdomen Pelvis Wo Contrast  Result Date: 07/03/2017 CLINICAL DATA:  Abdominal distension, worsening shortness of breath, lung cancer, pulmonary embolism, COPD, CHF EXAM: CT  ABDOMEN AND PELVIS WITHOUT CONTRAST TECHNIQUE: Multidetector CT imaging of the abdomen and pelvis was performed following the standard protocol without IV contrast. Sagittal and coronal MPR images reconstructed from axial data set. Patient drank dilute oral contrast for exam COMPARISON:  12/16/2016 FINDINGS: Lower chest: Moderate RIGHT pleural effusion. Peribronchial thickening, more pronounced in LEFT lower lobe with question mucous plugging emphysematous changes at LEFT base. Compressive atelectasis RIGHT lower lobe. Minimal LEFT pleural fluid. Hepatobiliary: Gallbladder and liver unremarkable Pancreas: Normal appearance Spleen: Normal appearance Adrenals/Urinary Tract: Adrenal glands, kidneys, ureters, and bladder normal appearance Stomach/Bowel: Visualization of proximal appendix in RIGHT pelvis, grossly unremarkable. Stool throughout colon. Large and small bowel loops normal appearance. Distended stomach with wall thickening of antrum and pylorus extending in the duodenal bulb could reflect gastritis. Vascular/Lymphatic: Atherosclerotic calcifications aorta and iliac arteries. Tip of central venous catheter at cavoatrial junction. No adenopathy. Reproductive: Minimal prostatic enlargement. Other: Low-attenuation free fluid in pelvis and minimally at scattered in upper abdomen. No free air. No hernia. Musculoskeletal: Sclerotic T12 vertebral body again identified, question due to osseous metastatic disease. Additional sclerotic focus at the inferior L2 vertebral body. Lytic lesion with sclerotic rim at inferior L3 unchanged. Metallic foreign body at inferior medial LEFT buttock. IMPRESSION: RIGHT pleural effusion and basilar atelectasis. Minimal LEFT pleural effusion and scattered ascites greatest in pelvis. Wall thickening of distal gastric antrum pylorus, could reflect gastritis. Suspected sclerotic metastatic lesions of T12 and L2 with an additional chronic well-circumscribed lytic focus at inferior L3.  Electronically Signed   By: Lavonia Dana M.D.   On: 07/03/2017 13:02   Dg Chest 1 View  Result Date: 07/11/2017 CLINICAL DATA:  Status post thoracentesis on the right EXAM: CHEST  1 VIEW COMPARISON:  07/11/2017 FINDINGS: Cardiac shadows within normal limits. Right chest wall port is again seen and stable. Interval right thoracentesis has been performed. A minimal basilar pneumothorax is noted. This is likely related incomplete re-expansion of the right lower lobe. Correlation with patient's symptomatology is recommended. No other focal abnormality is seen. Stable left pleural effusion is noted. IMPRESSION: Small right basilar pneumothorax. This is likely related incomplete re-expansion of the lower lobe. Correlate with patient's symptomatology. Electronically Signed   By: Inez Catalina M.D.   On: 07/11/2017 13:47   Dg Chest 1 View  Result Date: 07/05/2017 CLINICAL DATA:  S/p right sided thoracentesis today, 1.2 liters removed. EXAM: CHEST  1 VIEW COMPARISON:  Chest x-ray dated 07/04/2017. FINDINGS: Improved aeration at the RIGHT lung base, significantly decreased amount of pleural  effusion compared to the earlier CT of 06/30/2017. Stable small LEFT pleural effusion and/or atelectasis. No pneumothorax seen. Heart size and mediastinal contours are stable. RIGHT chest wall Port-A-Cath is stable in position with tip overlying the RIGHT atrium. No acute or suspicious osseous finding. IMPRESSION: Status post thoracentesis.  No pneumothorax. Electronically Signed   By: Franki Cabot M.D.   On: 07/05/2017 14:06   Dg Chest 2 View  Result Date: 07/27/2017 CLINICAL DATA:  Chronic shortness of breath EXAM: CHEST - 2 VIEW COMPARISON:  07/23/2017, 07/20/2017, 07/18/2017, CT chest 06/30/2017 FINDINGS: Right-sided central venous port tip overlies the cavoatrial region. There are small bilateral pleural effusions without significant change. Emphysema left greater than right. Spiculated opacity in the left suprahilar lung,  no change. Normal heart size. No pneumothorax. IMPRESSION: No significant interval change compared to recent priors with right greater than left pleural effusion, emphysema and spiculated left suprahilar lung mass. Electronically Signed   By: Donavan Foil M.D.   On: 07/27/2017 00:25   Dg Chest 2 View  Result Date: 07/20/2017 CLINICAL DATA:  Shortness of breath. EXAM: CHEST - 2 VIEW COMPARISON:  Chest x-ray from yesterday. FINDINGS: Unchanged right chest wall port catheter. The heart size and mediastinal contours are within normal limits. Normal pulmonary vascularity. The lungs remain hyperinflated with emphysematous changes. Increased interstitial markings in the right lung are similar to prior study. Unchanged small right pleural effusion. Unchanged left upper lobe spiculated mass. No acute osseous abnormality. IMPRESSION: 1. COPD with unchanged left upper lobe spiculated mass and small right pleural effusion. Electronically Signed   By: Titus Dubin M.D.   On: 07/20/2017 23:28   Dg Chest 2 View  Result Date: 07/18/2017 CLINICAL DATA:  Lung cancer. EXAM: CHEST - 2 VIEW COMPARISON:  Chest x-ray 07/16/2017, 05/28/2017, 03/17/2017. CT 03/13/2017. FINDINGS: PowerPort catheter noted with tip over right atrium. Cardiomegaly with normal pulmonary vascularity. Hyperexpansion of both lung fields noted consistent with COPD. Mild mid lung field basilar subsegmental atelectasis. Unchanged bilateral basilar pleural thickening consistent with persistent effusions and or scarring. IMPRESSION: 1.  PowerPort catheter noted in stable position. 2. COPD. Mild subsegmental atelectasis noted the mid lung fields and both bases. Mild bilateral basilar pleural thickening consistent with persistent effusions and/or scarring. No acute abnormality identified. 3.  Stable cardiomegaly.  No pulmonary venous congestion. Electronically Signed   By: Marcello Moores  Register   On: 07/18/2017 14:02   Dg Chest 2 View  Result Date:  07/16/2017 CLINICAL DATA:  Weakness, dyspnea EXAM: CHEST - 2 VIEW COMPARISON:  07/13/2017 chest radiograph. FINDINGS: Right subclavian MediPort terminates at the cavoatrial junction. Stable cardiomediastinal silhouette with normal heart size. No pneumothorax. Stable blunting of the right greater than left costophrenic angles. No pulmonary edema. Severe emphysema, asymmetrically prominent in the left lung, with lung hyperinflation. No acute consolidative airspace disease. Stable mild scarring at the lung bases. IMPRESSION: 1. No acute cardiopulmonary disease. 2. Lung hyperinflation and severe emphysema asymmetrically involving the left lung, compatible with COPD. 3. Stable blunting of the right greater than left costophrenic angles, which could represent small pleural effusions and/or pleural-parenchymal scarring. Electronically Signed   By: Ilona Sorrel M.D.   On: 07/16/2017 16:28   Dg Chest 2 View  Result Date: 07/13/2017 CLINICAL DATA:  57 year old male with dyspnea and chest pain. EXAM: CHEST - 2 VIEW COMPARISON:  Chest radiograph dated 07/12/2017 FINDINGS: Right-sided Port-A-Cath with tip in stable position. There is severe emphysematous changes of the lungs. Faint nodular density in the left upper  lobe better seen on the prior CT. There is no pneumothorax. Small stable bilateral pleural effusions, right greater left. The cardiac silhouette is within normal limits. No acute osseous pathology. IMPRESSION: 1. No acute cardiopulmonary process. 2. Severe emphysema and stable bilateral pleural effusions with Electronically Signed   By: Anner Crete M.D.   On: 07/13/2017 21:53   Dg Chest 2 View  Result Date: 07/11/2017 CLINICAL DATA:  Shortness of breath.  History of lung cancer. EXAM: CHEST - 2 VIEW COMPARISON:  Chest 07/05/2017, CT chest 06/30/2017 FINDINGS: Heart size and pulmonary vascularity are normal. Focal scarring in the left suprahilar and left upper lung region corresponding to lesion better  seen at previous CT chest. Small bilateral pleural effusions demonstrating mild increased since previous study. There is developing right perihilar and basilar infiltration which could represent asymmetrical edema or pneumonia. No pneumothorax. Power port type central venous catheter with tip over the cavoatrial junction region. IMPRESSION: Small but increasing bilateral pleural effusions. Increasing infiltration or edema in the right perihilar and basilar lung regions. Focal scarring in the left upper lung is identified, corresponding to changes better seen on previous CT scan. Electronically Signed   By: Lucienne Capers M.D.   On: 07/11/2017 00:30   Dg Chest 2 View  Result Date: 07/04/2017 CLINICAL DATA:  Shortness of breath EXAM: CHEST - 2 VIEW COMPARISON:  July 04, 2017 FINDINGS: A right Port-A-Cath is in good position. Bilateral pleural effusions are stable. Stable left upper lobe mass, unchanged. Increased lucency in the left lung compared to the right is stable. Increased haziness on the right is stable compared to the left. No change in the cardiomediastinal silhouette. IMPRESSION: No interval change in bilateral pleural effusions, a left upper lobe mass, the right Port-A-Cath, or haziness in the right chest. Electronically Signed   By: Dorise Bullion III M.D   On: 07/04/2017 22:33   Dg Chest 2 View  Result Date: 07/04/2017 CLINICAL DATA:  Worsening shortness of Breath EXAM: CHEST - 2 VIEW COMPARISON:  06/30/2017 FINDINGS: Cardiac shadow is stable. Right chest wall port is again seen. Right pleural effusion is seen and stable from the previous CT examination. Stable left upper lobe mass lesion is noted. No acute bony abnormality is noted. IMPRESSION: Stable left upper lobe mass. Right-sided pleural effusion similar to that seen on recent CT examination. Electronically Signed   By: Inez Catalina M.D.   On: 07/04/2017 08:38   Ct Angio Chest Pe W/cm &/or Wo Cm  Result Date: 07/29/2017 CLINICAL DATA:   Chronic dyspnea EXAM: CT ANGIOGRAPHY CHEST WITH CONTRAST TECHNIQUE: Multidetector CT imaging of the chest was performed using the standard protocol during bolus administration of intravenous contrast. Multiplanar CT image reconstructions and MIPs were obtained to evaluate the vascular anatomy. CONTRAST:  143mL ISOVUE-370 IOPAMIDOL (ISOVUE-370) INJECTION 76% COMPARISON:  Same day CXR, chest CT 06/30/2017 and 04/13/2017 FINDINGS: Cardiovascular: Partial dissolution of intraluminal nonocclusive pulmonary embolus at the bifurcation of the left main pulmonary artery since prior exam. No new pulmonary embolus is noted. Mediastinum/Nodes: Patent trachea and mainstem bronchi. No mediastinal or hilar lymphadenopathy. Lungs/Pleura: Stable moderate layering right pleural effusion. Centrilobular emphysema is noted, upper lobe predominant with spiculation in the left upper lobe extending to the left suprahilar region of the chest as before, largely unchanged in appearance since recent comparison and regressed since 04/13/2017 CT. There is compressive atelectasis at the right lung base secondary to the effusion. Upper Abdomen: No acute abnormality. Musculoskeletal: Relative heterogeneous sclerosis of the T12  vertebral body is unchanged. Review of the MIP images confirms the above findings. IMPRESSION: 1. Near complete dissolution of previously noted nonocclusive pulmonary embolus within the distal left main pulmonary artery. No new pulmonary embolus identified. 2. Moderate right pleural effusion with adjacent compressive atelectasis superimposed on COPD. Findings are similar to that of prior. 3. Redemonstration of spiculated opacity in the left upper lobe tracking to the left hilum, unchanged since prior and slightly smaller than on more remote study from March. Emphysema (ICD10-J43.9). Electronically Signed   By: Ashley Royalty M.D.   On: 07/29/2017 21:08   Ct Abdomen Pelvis W Contrast  Result Date: 07/16/2017 CLINICAL DATA:   Shortness of breath and dizziness. Diarrhea and nausea. EXAM: CT ABDOMEN AND PELVIS WITH CONTRAST TECHNIQUE: Multidetector CT imaging of the abdomen and pelvis was performed using the standard protocol following bolus administration of intravenous contrast. CONTRAST:  60mL ISOVUE-300 IOPAMIDOL (ISOVUE-300) INJECTION 61% COMPARISON:  CT abdomen pelvis 07/03/2017 FINDINGS: LOWER CHEST: Small pleural effusions, right greater than left. Bibasilar atelectasis. HEPATOBILIARY: Focal fat deposition along the course of the left portal vein. No focal liver lesion otherwise. No biliary dilatation. Gallbladder is partially contracted. PANCREAS: Normal parenchymal contours without ductal dilatation. No peripancreatic fluid collection. SPLEEN: Normal. ADRENALS/URINARY TRACT: --Adrenal glands: Normal. --Right kidney/ureter: No hydronephrosis, nephroureterolithiasis, perinephric stranding or solid renal mass. --Left kidney/ureter: No hydronephrosis, nephroureterolithiasis, perinephric stranding or solid renal mass. --Urinary bladder: Normal for degree of distention STOMACH/BOWEL: --Stomach/Duodenum: Persistent esophageal wall thickening near the gastroesophageal junction. --Small bowel: No dilatation or inflammation. --Colon: No focal abnormality. --Appendix: Not visualized. No right lower quadrant inflammation or free fluid. VASCULAR/LYMPHATIC: Atherosclerotic calcification is present within the non-aneurysmal abdominal aorta, without hemodynamically significant stenosis. The portal vein, splenic vein, superior mesenteric vein and IVC are patent. No abdominal or pelvic lymphadenopathy. REPRODUCTIVE: Normal prostate and seminal vesicles. MUSCULOSKELETAL. Unchanged lucent lesion of the left L3 vertebral body. T12 vertebral body remains sclerotic. OTHER: None. IMPRESSION: 1. No acute abnormality of the abdomen or pelvis. 2. Small pleural effusions, right larger than left but the right effusion is decreased in size from 07/03/2017. 3.   Aortic Atherosclerosis (ICD10-I70.0). 4. Unchanged sclerotic appearance of T12 and lucent lesion of the inferior endplate of L3. 5. Persistent wall thickening at the gastroesophageal junction. Electronically Signed   By: Ulyses Jarred M.D.   On: 07/16/2017 17:32   US Abdomen Limited  Result Date: 07/31/2017 CLINICAL DATA:  57 year old male with abdominal distension. Assess for ascites. EXAM: LIMITED ABDOMEN ULTRASOUND FOR ASCITES TECHNIQUE: Limited ultrasound survey for ascites was performed in all four abdominal quadrants. COMPARISON:  Prior abdominal ultrasound 07/27/2017 FINDINGS: Sonographic evaluation of the 4 quadrants of the abdomen demonstrates trace ascites. There is insufficient fluid for paracentesis. IMPRESSION: Trace ascites, insufficient for paracentesis. Electronically Signed   By: Jacqulynn Cadet M.D.   On: 07/31/2017 11:56   US Abdomen Limited  Result Date: 07/27/2017 CLINICAL DATA:  Abdominal distension for 10 months. EXAM: ULTRASOUND ABDOMEN LIMITED COMPARISON:  None. FINDINGS: Trace fluid around the liver. The stomach is distended and fluid-filled. Right pleural effusion. Heterogeneous hepatic echotexture. IMPRESSION: 1. Trace fluid around the liver. 2. Stomach distended with fluid. 3. Right pleural effusion. 4. Heterogeneous echotexture to the liver. Electronically Signed   By: Dorise Bullion III M.D   On: 07/27/2017 18:51   US Abdomen Limited  Result Date: 07/02/2017 CLINICAL DATA:  Evaluate for ascites. EXAM: ULTRASOUND ABDOMEN LIMITED COMPARISON:  None. FINDINGS: Large right pleural effusion.  Small ascites in the abdomen. IMPRESSION:  Large right pleural effusion.  Small ascites in the abdomen. Electronically Signed   By: Dorise Bullion III M.D   On: 07/02/2017 18:07   Dg Chest Port 1 View  Result Date: 07/29/2017 CLINICAL DATA:  Increasing shortness of breath. EXAM: PORTABLE CHEST 1 VIEW COMPARISON:  July 27, 2017 FINDINGS: Stable right Port-A-Cath. No pneumothorax. Stable  left suprahilar nodularity. Bilateral effusions, right greater than left, both relatively small. No change in the cardiomediastinal silhouette. IMPRESSION: No change in right greater than left small effusions or left suprahilar nodularity. Electronically Signed   By: Dorise Bullion III M.D   On: 07/29/2017 18:11   Dg Chest Portable 1 View  Result Date: 07/23/2017 CLINICAL DATA:  57 y/o M; shortness of breath and history of lung cancer. EXAM: PORTABLE CHEST 1 VIEW COMPARISON:  07/20/2017 chest radiograph. FINDINGS: Stable right port catheter with tip projecting over lower SVC. Stable heart size. Stable left upper lobe spiculated nodule. Stable small to moderate right pleural effusion. No new focal consolidation. No pneumothorax. Bones are unremarkable. Stable findings of COPD. IMPRESSION: Stable COPD, small to moderate right pleural effusion, and left upper lobe spiculated nodule. Electronically Signed   By: Kristine Garbe M.D.   On: 07/23/2017 23:41   Dg Chest Port 1 View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

## 2017-07-31 NOTE — ED Triage Notes (Signed)
Pt is DNR

## 2017-07-31 NOTE — Progress Notes (Signed)
Patient recently discharged on 07/28/2017 and readmitted on 07/29/2017. Patient recently assessed on 07/03/2017, no psychosocial changes reported.  CSW spoke with patient at bedside regarding discharge planning. Patient reported plan to discharge back to [name].   CSW spoke with [name] staff member [place], staff confirmed patient's ability to return.   CSW will complete FL2 and send to [name].   CSW will coordinate patient's transportation when patient is ready to discharge.  Abundio Miu, [place] Social Worker [facility] Cell#: [phone]

## 2017-07-31 NOTE — NC FL2 (Signed)
[place] LEVEL OF CARE SCREENING TOOL     IDENTIFICATION  Patient Name: [name] Birthdate: 1960-08-08 Sex: male Admission Date (Current Location): 07/29/2017  [facility] and [place] Number:  Herbalist and Address:  [facility],  [place], [place]      Provider Number: [id number]  Attending Physician Name and Address:  Mariel Aloe, MD  Relative Name and Phone Number:       Current Level of Care: Hospital Recommended Level of Care: [place] Prior Approval Number:    Date Approved/Denied:   PASRR Number: [id number]  Discharge Plan: Other (Comment)(Assisted Living Facility)    Current Diagnoses: Patient Active Problem List   Diagnosis Date Noted  . Protein-calorie malnutrition, severe 07/31/2017  . Thrombocytopenia ([place]) 07/29/2017  . Wheezing on exhalation 07/29/2017  . Anemia 07/24/2017  . Normocytic anemia 07/24/2017  . Chronic diastolic (congestive) heart failure ([place]) 07/24/2017  . Recurrent pleural effusion on right 07/15/2017  . Acute on chronic respiratory failure ([place]) 07/11/2017  . History of pulmonary embolism 07/11/2017  . PE (pulmonary thromboembolism) ([place]) 06/30/2017  . Pulmonary embolism ([place]) 06/30/2017  . AKI (acute kidney injury) ([place]) 06/25/2017  . Dyspnea 06/21/2017  . Chronic combined systolic (congestive) and diastolic (congestive) heart failure ([place])   . Constipation 06/07/2017  . Pain   . Acute on chronic respiratory failure with hypoxia ([place]) 05/28/2017  . Chronic respiratory failure with hypoxia ([place]) 04/01/2017  . Leucocytosis 03/16/2017  . CKD (chronic kidney disease), stage III ([place]) 03/12/2017  . Malnutrition of moderate degree 03/10/2017  . COPD exacerbation ([place]) 03/09/2017  . Macrocytic anemia 02/07/2017  . Severe malnutrition ([place]) 01/03/2017  . DOE (dyspnea on exertion) 12/30/2016  . Nausea   . Hypervolemia   . HCAP (healthcare-associated  pneumonia) 12/19/2016  . SOB (shortness of breath) 12/19/2016  . Erosive gastropathy 12/18/2016  . Gastritis 12/18/2016  . Hematemesis 12/16/2016  . Intractable nausea and vomiting 12/16/2016  . HTN (hypertension) 10/30/2016  . Chronic obstructive pulmonary disease ([place]) 09/02/2016  . COPD (chronic obstructive pulmonary disease) ([place]) 08/19/2016  . Dehydration 06/04/2016  . Abdominal pain 06/04/2016  . 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 Adenocarcinoma of left lung, stage 4 ([place]) 05/02/2016  . Goals of care, counseling/discussion 05/02/2016  . Encounter for antineoplastic chemotherapy 05/02/2016  . Tobacco abuse 05/30/2011  . Alcohol abuse 05/30/2011  . Seizure ([place]) 05/28/2011    Orientation RESPIRATION BLADDER Height & Weight     Self, Time, Situation, Place  O2 Continent Weight: 116 lb 10 oz (52.9 kg) Height:  5\' 11"  (180.3 cm)  BEHAVIORAL SYMPTOMS/MOOD NEUROLOGICAL BOWEL NUTRITION STATUS      Continent Diet(regular)  AMBULATORY STATUS COMMUNICATION OF NEEDS Skin   Supervision Verbally Normal                       Personal Care Assistance Level of Assistance  Bathing, Feeding, Dressing Bathing Assistance: Limited assistance Feeding assistance: Independent Dressing Assistance: Limited assistance     Functional Limitations Info  Sight, Hearing, Speech Sight Info: Adequate Hearing Info: Adequate Speech Info: Adequate    SPECIAL CARE FACTORS FREQUENCY  PT (By licensed PT)     PT Frequency: HHPT              Contractures Contractures Info: Not present    Additional Factors Info  Code Status, Allergies Code Status Info: DNR Allergies Info: NKA  Discharge Medications:  Medication List    STOP taking these medications   apixaban 5 MG Tabs tablet Commonly known as:  ELIQUIS   azithromycin 250 MG tablet Commonly known as:  ZITHROMAX   predniSONE 10 MG tablet Commonly known as:  DELTASONE     TAKE these medications    acetaminophen 500 MG tablet Commonly known as:  TYLENOL Take 1 tablet (500 mg total) by mouth 2 (two) times daily as needed for mild pain.   albuterol (2.5 MG/3ML) 0.083% nebulizer solution Commonly known as:  PROVENTIL 1 neb every 4-6 hours as needed for wheezing and shortness of breath   ALKA-SELTZER PLUS COLD PO Take 2 tablets by mouth at bedtime as needed (COUGH).   dexamethasone 4 MG tablet Commonly known as:  DECADRON Take 4 mg by mouth 2 (two) times daily. The day before, the day of, and the day after chemotherapy every three weeks   escitalopram 5 MG tablet Commonly known as:  LEXAPRO Take 1 tablet (5 mg total) by mouth at bedtime.   feeding supplement (ENSURE ENLIVE) Liqd Take 237 mLs by mouth 3 (three) times daily between meals.   ferrous sulfate 325 (65 FE) MG tablet Take 1 tablet (325 mg total) by mouth 2 (two) times daily with a meal.   Fluticasone-Umeclidin-Vilant 100-62.5-25 MCG/INH Aepb Commonly known as:  TRELEGY ELLIPTA Inhale 1 puff into the lungs daily.   folic acid 1 MG tablet Commonly known as:  FOLVITE Take 1 tablet (1 mg total) by mouth daily.   furosemide 40 MG tablet Commonly known as:  LASIX Take 1 tablet (40 mg total) by mouth daily.   gabapentin 100 MG capsule Commonly known as:  NEURONTIN Take 1 capsule (100 mg total) by mouth 3 (three) times daily.   hydrOXYzine 10 MG tablet Commonly known as:  ATARAX/VISTARIL Take 1 tablet (10 mg total) by mouth 3 (three) times daily as needed for anxiety.   Ipratropium-Albuterol 20-100 MCG/ACT Aers respimat Commonly known as:  COMBIVENT Inhale 1 puff into the lungs every 6 (six) hours.   ipratropium-albuterol 0.5-2.5 (3) MG/3ML Soln Commonly known as:  DUONEB Take 3 mLs by nebulization 4 (four) times daily.   loratadine 10 MG tablet Commonly known as:  CLARITIN Take 10 mg by mouth daily.   metoprolol tartrate 25 MG tablet Commonly known as:  LOPRESSOR Take 12.5 mg by mouth 2  (two) times daily.   MUCINEX 600 MG 12 hr tablet Generic drug:  guaiFENesin Take 1,200 mg by mouth 2 (two) times daily.   ondansetron 4 MG disintegrating tablet Commonly known as:  ZOFRAN-ODT Take 4 mg by mouth every 8 (eight) hours as needed for nausea or vomiting.   Oxycodone HCl 10 MG Tabs Take 1 tablet (10 mg total) by mouth every 4 (four) hours as needed for up to 15 doses (pain/shortness of breath).   pantoprazole 40 MG tablet Commonly known as:  PROTONIX Take 1 tablet (40 mg total) by mouth 2 (two) times daily.   polyethylene glycol packet Commonly known as:  MIRALAX / GLYCOLAX Take 17 g by mouth daily.   potassium chloride 10 MEQ tablet Commonly known as:  K-DUR Take 2 tablets (20 mEq total) by mouth daily.   SENNA PLUS PO Take 2 tablets by mouth 2 (two) times daily. What changed:  Another medication with the same name was changed. Make sure you understand how and when to take each.   senna-docusate 8.6-50 MG tablet Commonly known as:  Senokot-S Take 2  tablets by mouth 2 (two) times daily. What changed:  how much to take   sucralfate 1 GM/10ML suspension Commonly known as:  CARAFATE Take 10 mLs (1 g total) by mouth 4 (four) times daily -  with meals and at bedtime.   THERA-TABS PO Take 1 tablet by mouth daily.   TUSSIN DM 10-100 MG/5ML liquid Generic drug:  Dextromethorphan-guaiFENesin Take 5 mLs by mouth every 12 (twelve) hours.   vitamin B-12 1000 MCG tablet Commonly known as:  CYANOCOBALAMIN Take 1 tablet (1,000 mcg total) by mouth daily.       Relevant Imaging Results:  Relevant Lab Results:   Additional Information SSN [id number]  [name], LCSW

## 2017-07-31 NOTE — Discharge Instructions (Signed)
[name],  You were in the hospital because of your difficulty breathing. You were treated for this. Please follow up with a primary care physcian and your oncologist with regard to your pulmonary embolism and anticoagulation.

## 2017-08-01 ENCOUNTER — Emergency Department (HOSPITAL_COMMUNITY)
Admission: EM | Admit: 2017-08-01 | Discharge: 2017-08-01 | Disposition: A | Discharge status: Home / Self Care | Payer: Medicaid Other | Source: Home / Self Care | Attending: Emergency Medicine | Admitting: Emergency Medicine

## 2017-08-01 ENCOUNTER — Encounter (HOSPITAL_COMMUNITY): Payer: Self-pay | Admitting: Emergency Medicine

## 2017-08-01 ENCOUNTER — Emergency Department (HOSPITAL_COMMUNITY): Payer: Medicaid Other

## 2017-08-01 ENCOUNTER — Encounter (HOSPITAL_COMMUNITY): Payer: Self-pay | Admitting: Nurse Practitioner

## 2017-08-01 DIAGNOSIS — J441 Chronic obstructive pulmonary disease with (acute) exacerbation: Secondary | ICD-10-CM

## 2017-08-01 DIAGNOSIS — R1084 Generalized abdominal pain: Secondary | ICD-10-CM

## 2017-08-01 DIAGNOSIS — C3490 Malignant neoplasm of unspecified part of unspecified bronchus or lung: Secondary | ICD-10-CM

## 2017-08-01 LAB — CBC
HCT: 24.7 % — ABNORMAL LOW (ref 39.0–52.0)
Hemoglobin: 8 g/dL — ABNORMAL LOW (ref 13.0–17.0)
MCH: 32.7 pg (ref 26.0–34.0)
MCHC: 32.4 g/dL (ref 30.0–36.0)
MCV: 100.8 fL — AB (ref 78.0–100.0)
PLATELETS: 58 10*3/uL — AB (ref 150–400)
RBC: 2.45 MIL/uL — AB (ref 4.22–5.81)
RDW: 16.6 % — AB (ref 11.5–15.5)
WBC: 12.3 10*3/uL — ABNORMAL HIGH (ref 4.0–10.5)

## 2017-08-01 LAB — COMPREHENSIVE METABOLIC PANEL
ALT: 21 U/L (ref 0–44)
AST: 24 U/L (ref 15–41)
Albumin: 3.2 g/dL — ABNORMAL LOW (ref 3.5–5.0)
Alkaline Phosphatase: 102 U/L (ref 38–126)
Anion gap: 10 (ref 5–15)
BUN: 25 mg/dL — ABNORMAL HIGH (ref 6–20)
CHLORIDE: 100 mmol/L (ref 98–111)
CO2: 33 mmol/L — ABNORMAL HIGH (ref 22–32)
CREATININE: 1.42 mg/dL — AB (ref 0.61–1.24)
Calcium: 9.1 mg/dL (ref 8.9–10.3)
GFR, EST NON AFRICAN AMERICAN: 53 mL/min — AB (ref >60)
Glucose, Bld: 161 mg/dL — ABNORMAL HIGH (ref 70–99)
Potassium: 3.6 mmol/L (ref 3.5–5.1)
Sodium: 143 mmol/L (ref 135–145)
Total Bilirubin: 0.2 mg/dL — ABNORMAL LOW (ref 0.3–1.2)
Total Protein: 6.2 g/dL — ABNORMAL LOW (ref 6.5–8.1)

## 2017-08-01 LAB — I-STAT TROPONIN, ED
TROPONIN I, POC: 0.02 ng/mL (ref 0.00–0.08)
Troponin i, poc: 0 ng/mL (ref 0.00–0.08)

## 2017-08-01 LAB — LIPASE, BLOOD: LIPASE: 22 U/L (ref 11–51)

## 2017-08-01 MED ORDER — OXYCODONE-ACETAMINOPHEN 5-325 MG PO TABS
1.0000 | ORAL_TABLET | Freq: Once | ORAL | Status: AC
  Administered 2017-08-01: 1 via ORAL
  Filled 2017-08-01: qty 1

## 2017-08-01 MED ORDER — OXYCODONE-ACETAMINOPHEN 5-325 MG PO TABS: 1.0000 | ORAL_TABLET | ORAL | 0 refills | Status: DC | PRN

## 2017-08-01 MED ORDER — HEPARIN SOD (PORK) LOCK FLUSH 100 UNIT/ML IV SOLN
500.0000 [IU] | Freq: Once | INTRAVENOUS | Status: AC
  Administered 2017-08-01: 500 [IU]
  Filled 2017-08-01: qty 5

## 2017-08-01 NOTE — ED Notes (Signed)
On D/C patient was asked if he needed anything else. He said no. When he was loaded onto stretcher for PTAR, he said he needed a breathing treatment, "This is fuckin ridiculous. I'll be back for that breathing treatment and I'll eat all your food!" I said ok sir, have a nice day.

## 2017-08-01 NOTE — ED Notes (Signed)
Bed: WA17 Expected date:  Expected time:  Means of arrival:  Comments: EMS-SOB

## 2017-08-01 NOTE — ED Provider Notes (Signed)
evaluation of [facility] DEPT Provider Note   CSN: [id number] Arrival date & time: 08/01/17  1106     History   Chief Complaint Chief Complaint  Patient presents with  . Shortness of Breath    HPI [name] is a 58 y.o. male.  HPI Pt presents to the ED for evaluation of shortness of breath.  He has multiple medical problems as listed below.  Pt frequently comes to the ED for pain in his abdomen, and shortness of breath.   Pt states he felt short of breath this am and could not catch his breath.  He states he was using his oxygen and was just sitting down at the time.  He had pain in his chest and his abdomen.  He has had a few episodes of vomiting.  No fever.  No cough.  No diarrhea.  Into the EMS report, when they arrived the patient was on room air and his oxygen saturation was down in the 70s.  He went back up to the 90s on supplemental oxygen which he is supposed to be on Past Medical History:  Diagnosis Date  . Abdominal pain 06/04/2016  . Adenocarcinoma of left lung, stage 4 ([place]) 05/02/2016  . Alcohol abuse   . Bronchitis due to tobacco use ([place])   . Cancer ([place])    Lung  . COPD (chronic obstructive pulmonary disease) ([place])    on home o2 2LNC   . Dehydration 06/04/2016  . Encounter for antineoplastic chemotherapy 05/02/2016  . Gastritis   . Goals of care, counseling/discussion 05/02/2016  . Hematemesis   . HTN (hypertension) 10/30/2016  . Recurrent upper respiratory infection (URI)   . Seizures ([place]) 05/2011   new onset  . Shortness of breath     Patient Active Problem List   Diagnosis Date Noted  . Protein-calorie malnutrition, severe 07/31/2017  . Thrombocytopenia ([place]) 07/29/2017  . Wheezing on exhalation 07/29/2017  . Anemia 07/24/2017  . Normocytic anemia 07/24/2017  . Chronic diastolic (congestive) heart failure ([place]) 07/24/2017  . Recurrent pleural effusion on right 07/15/2017  . Acute on chronic respiratory failure  ([place]) 07/11/2017  . History of pulmonary embolism 07/11/2017  . PE (pulmonary thromboembolism) ([place]) 06/30/2017  . Pulmonary embolism ([place]) 06/30/2017  . AKI (acute kidney injury) ([place]) 06/25/2017  . Dyspnea 06/21/2017  . Chronic combined systolic (congestive) and diastolic (congestive) heart failure ([place])   . Constipation 06/07/2017  . Pain   . Acute on chronic respiratory failure with hypoxia ([place]) 05/28/2017  . Chronic respiratory failure with hypoxia ([place]) 04/01/2017  . Leucocytosis 03/16/2017  . CKD (chronic kidney disease), stage III ([place]) 03/12/2017  . Malnutrition of moderate degree 03/10/2017  . COPD exacerbation ([place]) 03/09/2017  . Macrocytic anemia 02/07/2017  . Severe malnutrition ([place]) 01/03/2017  . DOE (dyspnea on exertion) 12/30/2016  . Nausea   . Hypervolemia   . HCAP (healthcare-associated pneumonia) 12/19/2016  . SOB (shortness of breath) 12/19/2016  . Erosive gastropathy 12/18/2016  . Gastritis 12/18/2016  . Hematemesis 12/16/2016  . Intractable nausea and vomiting 12/16/2016  . HTN (hypertension) 10/30/2016  . Chronic obstructive pulmonary disease ([place]) 09/02/2016  . COPD (chronic obstructive pulmonary disease) ([place]) 08/19/2016  . Dehydration 06/04/2016  . Abdominal pain 06/04/2016  . 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 Adenocarcinoma of left lung, stage 4 ([place]) 05/02/2016  . Goals of care, counseling/discussion 05/02/2016  . Encounter for antineoplastic chemotherapy 05/02/2016  . Tobacco abuse 05/30/2011  . Alcohol abuse 05/30/2011  .  Seizure ([place]) 05/28/2011    Past Surgical History:  Procedure Laterality Date  . ESOPHAGOGASTRODUODENOSCOPY (EGD) WITH PROPOFOL N/A 12/17/2016   Procedure: ESOPHAGOGASTRODUODENOSCOPY (EGD) WITH PROPOFOL;  Surgeon: Ladene Artist, MD;  Location: [facility] ENDOSCOPY;  Service: Endoscopy;  Laterality: N/A;  . IR FLUORO GUIDE PORT INSERTION RIGHT  05/13/2016  . IR US GUIDE VASC ACCESS RIGHT  05/13/2016  . NO PAST SURGERIES           Home Medications    Prior to Admission medications   Medication Sig Start Date End Date Taking? Authorizing Provider  acetaminophen (TYLENOL) 500 MG tablet Take 1 tablet (500 mg total) by mouth 2 (two) times daily as needed for mild pain. 07/07/17  Yes Florencia Reasons, MD  albuterol (PROVENTIL) (2.5 MG/3ML) 0.083% nebulizer solution 1 neb every 4-6 hours as needed for wheezing and shortness of breath Patient taking differently: Take 2.5 mg by nebulization every 6 (six) hours as needed for wheezing or shortness of breath.  04/01/17  Yes Parrett, Fonnie Mu, NP  apixaban (ELIQUIS) 5 MG TABS tablet Take 5 mg by mouth 2 (two) times daily.   Yes [provider]  Chlorphen-Phenyleph-ASA (ALKA-SELTZER PLUS COLD PO) Take 2 tablets by mouth at bedtime as needed (COUGH).   Yes [provider]  Dextromethorphan-guaiFENesin (TUSSIN DM) 10-100 MG/5ML liquid Take 5 mLs by mouth every 12 (twelve) hours.   Yes [provider]  escitalopram (LEXAPRO) 5 MG tablet Take 1 tablet (5 mg total) by mouth at bedtime. 04/17/17  Yes Rosita Fire, MD  feeding supplement, ENSURE ENLIVE, (ENSURE ENLIVE) LIQD Take 237 mLs by mouth 3 (three) times daily between meals. 03/19/17  Yes Shelly Coss, MD  ferrous sulfate 325 (65 FE) MG tablet Take 1 tablet (325 mg total) by mouth 2 (two) times daily with a meal. 07/12/17  Yes Lavina Hamman, MD  Fluticasone-Umeclidin-Vilant (TRELEGY ELLIPTA) 100-62.5-25 MCG/INH AEPB Inhale 1 puff into the lungs daily. 05/31/17  Yes Aline August, MD  folic acid (FOLVITE) 1 MG tablet Take 1 tablet (1 mg total) by mouth daily. 07/13/17  Yes Lavina Hamman, MD  furosemide (LASIX) 40 MG tablet Take 1 tablet (40 mg total) by mouth daily. 07/13/17  Yes Lavina Hamman, MD  gabapentin (NEURONTIN) 100 MG capsule Take 1 capsule (100 mg total) by mouth 3 (three) times daily. 07/12/17  Yes Lavina Hamman, MD  guaiFENesin (MUCINEX) 600 MG 12 hr tablet Take 1,200 mg by mouth 2  (two) times daily.    Yes [provider]  hydrOXYzine (ATARAX/VISTARIL) 10 MG tablet Take 1 tablet (10 mg total) by mouth 3 (three) times daily as needed for anxiety. 07/25/17  Yes Donne Hazel, MD  loratadine (CLARITIN) 10 MG tablet Take 10 mg by mouth daily.   Yes [provider]  metoprolol tartrate (LOPRESSOR) 25 MG tablet Take 12.5 mg by mouth 2 (two) times daily.    Yes [provider]  Multiple Vitamin (THERA-TABS PO) Take 1 tablet by mouth daily.   Yes [provider]  ondansetron (ZOFRAN-ODT) 4 MG disintegrating tablet Take 4 mg by mouth every 8 (eight) hours as needed for nausea or vomiting.   Yes [provider]  Oxycodone HCl 10 MG TABS Take 1 tablet (10 mg total) by mouth every 4 (four) hours as needed for up to 15 doses (pain/shortness of breath). 07/13/17  Yes McDonald, Mia A, PA-C  pantoprazole (PROTONIX) 40 MG tablet Take 1 tablet (40 mg total) by mouth  2 (two) times daily. 12/18/16  Yes Eugenie Filler, MD  senna (SENOKOT) 8.6 MG TABS tablet Take 1 tablet by mouth 2 (two) times daily.   Yes [provider]  senna-docusate (SENOKOT-S) 8.6-50 MG tablet Take 2 tablets by mouth 2 (two) times daily.   Yes [provider]  sucralfate (CARAFATE) 1 GM/10ML suspension Take 10 mLs (1 g total) by mouth 4 (four) times daily -  with meals and at bedtime. 07/12/17  Yes Lavina Hamman, MD  dexamethasone (DECADRON) 4 MG tablet Take 4 mg by mouth 2 (two) times daily. The day before, the day of, and the day after chemotherapy every three weeks    [provider]  ipratropium-albuterol (DUONEB) 0.5-2.5 (3) MG/3ML SOLN Take 3 mLs by nebulization 4 (four) times daily. 07/28/17   Lavina Hamman, MD  polyethylene glycol [facility]aveling) packet Take 17 g by mouth daily. 07/29/17   Lavina Hamman, MD  potassium chloride (K-DUR) 10 MEQ tablet Take 2 tablets (20 mEq total) by mouth daily. 07/28/17   Lavina Hamman, MD  senna-docusate  (SENOKOT-S) 8.6-50 MG tablet Take 2 tablets by mouth 2 (two) times daily. Patient taking differently: Take 1 tablet by mouth 2 (two) times daily.  07/12/17   Lavina Hamman, MD  vitamin B-12 (CYANOCOBALAMIN) 1000 MCG tablet Take 1 tablet (1,000 mcg total) by mouth daily. 07/28/17   Lavina Hamman, MD    Family History Family History  Problem Relation Age of Onset  . Cancer Father   . Diabetes Mellitus II Mother     Social History Social History   Tobacco Use  . Smoking status: Current Some Day Smoker    Packs/day: 0.10    Years: 30.00    Pack years: 3.00    Types: Cigarettes  . Smokeless tobacco: Never Used  . Tobacco comment: 1 cig a week  Substance Use Topics  . Alcohol use: No    Frequency: Never  . Drug use: No     Allergies   Patient has no known allergies.   Review of Systems Review of Systems  All other systems reviewed and are negative.    Physical Exam Updated Vital Signs BP 123/84   Pulse (!) 101   Temp 98.2 F (36.8 C) (Oral)   Resp 19   SpO2 100%   Physical Exam  Constitutional: No distress.  Tonically ill-appearing  HENT:  Head: Normocephalic and atraumatic.  Right Ear: External ear normal.  Left Ear: External ear normal.  Eyes: Conjunctivae are normal. Right eye exhibits no discharge. Left eye exhibits no discharge. No scleral icterus.  Neck: Neck supple. No tracheal deviation present.  Cardiovascular: Normal rate, regular rhythm and intact distal pulses.  Pulmonary/Chest: Effort normal and breath sounds normal. No stridor. No respiratory distress. He has no wheezes. He has no rales.  Oxygen saturation is 100%  Abdominal: Soft. Bowel sounds are normal. He exhibits no distension. There is generalized tenderness. There is no rebound and no guarding.  No distention  Musculoskeletal: He exhibits no edema or tenderness.  Neurological: He is alert. He has normal strength. No cranial nerve deficit (no facial droop, extraocular movements intact, no  slurred speech) or sensory deficit. He exhibits normal muscle tone. He displays no seizure activity. Coordination normal.  Skin: Skin is warm and dry. No rash noted.  Psychiatric: He has a normal mood and affect.  Nursing note and vitals reviewed.    ED Treatments / Results  Labs (  all labs ordered are listed, but only abnormal results are displayed) Labs Reviewed  CBC - Abnormal; Notable for the following components:      Result Value   WBC 12.3 (*)    RBC 2.45 (*)    Hemoglobin 8.0 (*)    HCT 24.7 (*)    MCV 100.8 (*)    RDW 16.6 (*)    Platelets 58 (*)    All other components within normal limits  COMPREHENSIVE METABOLIC PANEL - Abnormal; Notable for the following components:   CO2 33 (*)    Glucose, Bld 161 (*)    BUN 25 (*)    Creatinine, Ser 1.42 (*)    Total Protein 6.2 (*)    Albumin 3.2 (*)    Total Bilirubin 0.2 (*)    GFR calc non Af Amer 53 (*)    All other components within normal limits  LIPASE, BLOOD  I-STAT TROPONIN, ED  I-STAT TROPONIN, ED    EKG None  Radiology Dg Chest 2 View  Result Date: 07/31/2017 CLINICAL DATA:  Initial evaluation for acute generalized chest pain, shortness of breath. EXAM: CHEST - 2 VIEW COMPARISON:  Prior radiograph and CT from 07/29/2017 FINDINGS: Right-sided Port-A-Cath in place with tip in the proximal right atrium. Cardiac and mediastinal silhouettes are stable, and remain within normal limits. Lungs normally inflated. Persistent right greater than left layering pleural effusions, stable. Associated right basilar opacity also unchanged. Left suprahilar nodular density is unchanged. No new focal airspace disease. No pneumothorax. Underlying emphysema noted. Osseous structures unchanged. IMPRESSION: 1. Stable appearance of the chest with small right greater than left layering pleural effusions with associated right basilar opacity, likely atelectasis. 2. Underlying emphysema. Electronically Signed   By: Jeannine Boga M.D.    On: 07/31/2017 20:23   US Abdomen Limited  Result Date: 07/31/2017 CLINICAL DATA:  57 year old male with abdominal distension. Assess for ascites. EXAM: LIMITED ABDOMEN ULTRASOUND FOR ASCITES TECHNIQUE: Limited ultrasound survey for ascites was performed in all four abdominal quadrants. COMPARISON:  Prior abdominal ultrasound 07/27/2017 FINDINGS: Sonographic evaluation of the 4 quadrants of the abdomen demonstrates trace ascites. There is insufficient fluid for paracentesis. IMPRESSION: Trace ascites, insufficient for paracentesis. Electronically Signed   By: Jacqulynn Cadet M.D.   On: 07/31/2017 11:56   Dg Chest Portable 1 View  Result Date: 08/01/2017 CLINICAL DATA:  Shortness of breath with stage IV lung cancer. EXAM: PORTABLE CHEST 1 VIEW COMPARISON:  07/31/2017 FINDINGS: 1150 hours. Lungs are hyperexpanded. Small bilateral pleural effusions again noted. There is stable appearance of interstitial prominence and collapse/consolidation at the right base. The left suprahilar nodule appears unchanged. The cardiopericardial silhouette is within normal limits for size. Right Port-A-Cath is stable. Telemetry leads overlie the chest. IMPRESSION: Stable exam. Emphysema with small bilateral pleural effusions, collapse/consolidation at the right base and left suprahilar nodule. Electronically Signed   By: Misty Stanley M.D.   On: 08/01/2017 12:31    Procedures Procedures (including critical care time)  Medications Ordered in ED Medications - No data to display   Initial Impression / Assessment and Plan / ED Course  I have reviewed the triage vital signs and the nursing notes.  Pertinent labs & imaging results that were available during my care of the patient were reviewed by me and considered in my medical decision making (see chart for details).  Clinical Course as of Aug 01 1508  Fri Aug 01, 2017  1509 Laboratory tests reviewed.   [JK]  1509 Anemia stable.  Thrombocytopenia is stable.   [XA]   1587 Renal function stable   [JK]  1509 No acute findings noted on chest x-ray.   [JK]    Clinical Course User Index [JK] Dorie Rank, MD    Pt presented to the ED for evaluation of shortness of breath.  Pt has a history of COPD.  Pt is chronically on home 02.  Pt was just discharged from the hospital yesterday.  No signs of acute infection.  Labs are stable.  He is breathing easily in the ED, no wheezing noted on my exam.  Pt o2 sat is normal while he is on his oxygen.  There may be a component of anxiety with his COPD.  Will check his delta troponin.  If normal, stable for discharge and outpatient follow up.  Final Clinical Impressions(s) / ED Diagnoses   Final diagnoses:  COPD exacerbation [facility])    ED Discharge Orders    None      Dorie Rank, MD 08/01/17 1513

## 2017-08-01 NOTE — ED Notes (Signed)
[facility] contacted for transport 

## 2017-08-01 NOTE — Discharge Instructions (Signed)
Continue current medications, follow-up with your pulmonary doctor to make sure your symptoms are improving

## 2017-08-01 NOTE — ED Triage Notes (Signed)
Per EMS, pt returned from our ED this morning with O2 sats in 70's on RA. 1 neb en route, still has diminished lower lobe sounds. 98% on 4L now.

## 2017-08-01 NOTE — Discharge Instructions (Addendum)
Follow-up with your primary care doctor for further evaluation and treatment.  Ask your oncologist about meeting with the palliative care doctor.

## 2017-08-01 NOTE — ED Provider Notes (Signed)
[facility] DEPT Provider Note   CSN: [id number] Arrival date & time: 08/01/17  1948     History   Chief Complaint Chief Complaint  Patient presents with  . Abdominal Pain  . Anxiety    HPI [name] is a 57 y.o. male.  HPI   Patient presents complaining of shortness of breath and chest pain.  He was here earlier today, evaluated for COPD exacerbation.He states he does not currently have a narcotic pain medicine to take at home. He denies nausea, vomiting, diarrhea or constipation.  He has no other complaints.  He is currently receiving palliative chemotherapy and palliative radiation for stage IV lung cancer.There are no other known modifying factors.    Past Medical History:  Diagnosis Date  . Abdominal pain 06/04/2016  . Adenocarcinoma of left lung, stage 4 ([place]) 05/02/2016  . Alcohol abuse   . Bronchitis due to tobacco use ([place])   . Cancer ([place])    Lung  . COPD (chronic obstructive pulmonary disease) ([place])    on home o2 2LNC   . Dehydration 06/04/2016  . Encounter for antineoplastic chemotherapy 05/02/2016  . Gastritis   . Goals of care, counseling/discussion 05/02/2016  . Hematemesis   . HTN (hypertension) 10/30/2016  . Recurrent upper respiratory infection (URI)   . Seizures ([place]) 05/2011   new onset  . Shortness of breath     Patient Active Problem List   Diagnosis Date Noted  . Protein-calorie malnutrition, severe 07/31/2017  . Thrombocytopenia ([place]) 07/29/2017  . Wheezing on exhalation 07/29/2017  . Anemia 07/24/2017  . Normocytic anemia 07/24/2017  . Chronic diastolic (congestive) heart failure ([place]) 07/24/2017  . Recurrent pleural effusion on right 07/15/2017  . Acute on chronic respiratory failure ([place]) 07/11/2017  . History of pulmonary embolism 07/11/2017  . PE (pulmonary thromboembolism) ([place]) 06/30/2017  . Pulmonary embolism ([place]) 06/30/2017  . AKI (acute kidney injury) ([place]) 06/25/2017  . Dyspnea 06/21/2017   . Chronic combined systolic (congestive) and diastolic (congestive) heart failure ([place])   . Constipation 06/07/2017  . Pain   . Acute on chronic respiratory failure with hypoxia ([place]) 05/28/2017  . Chronic respiratory failure with hypoxia ([place]) 04/01/2017  . Leucocytosis 03/16/2017  . CKD (chronic kidney disease), stage III ([place]) 03/12/2017  . Malnutrition of moderate degree 03/10/2017  . COPD exacerbation ([place]) 03/09/2017  . Macrocytic anemia 02/07/2017  . Severe malnutrition ([place]) 01/03/2017  . DOE (dyspnea on exertion) 12/30/2016  . Nausea   . Hypervolemia   . HCAP (healthcare-associated pneumonia) 12/19/2016  . SOB (shortness of breath) 12/19/2016  . Erosive gastropathy 12/18/2016  . Gastritis 12/18/2016  . Hematemesis 12/16/2016  . Intractable nausea and vomiting 12/16/2016  . HTN (hypertension) 10/30/2016  . Chronic obstructive pulmonary disease ([place]) 09/02/2016  . COPD (chronic obstructive pulmonary disease) ([place]) 08/19/2016  . Dehydration 06/04/2016  . Abdominal pain 06/04/2016  . 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 Adenocarcinoma of left lung, stage 4 ([place]) 05/02/2016  . Goals of care, counseling/discussion 05/02/2016  . Encounter for antineoplastic chemotherapy 05/02/2016  . Tobacco abuse 05/30/2011  . Alcohol abuse 05/30/2011  . Seizure ([place]) 05/28/2011    Past Surgical History:  Procedure Laterality Date  . ESOPHAGOGASTRODUODENOSCOPY (EGD) WITH PROPOFOL N/A 12/17/2016   Procedure: ESOPHAGOGASTRODUODENOSCOPY (EGD) WITH PROPOFOL;  Surgeon: Ladene Artist, MD;  Location: [facility] ENDOSCOPY;  Service: Endoscopy;  Laterality: N/A;  . IR FLUORO GUIDE PORT INSERTION RIGHT  05/13/2016  . IR US GUIDE VASC ACCESS RIGHT  05/13/2016  . NO PAST SURGERIES          Home Medications    Prior to Admission medications   Medication Sig Start Date End Date Taking? Authorizing Provider  acetaminophen (TYLENOL) 500 MG tablet Take 1 tablet (500 mg total) by mouth 2 (two) times  daily as needed for mild pain. 07/07/17   Florencia Reasons, MD  albuterol (PROVENTIL) (2.5 MG/3ML) 0.083% nebulizer solution 1 neb every 4-6 hours as needed for wheezing and shortness of breath Patient taking differently: Take 2.5 mg by nebulization every 6 (six) hours as needed for wheezing or shortness of breath.  04/01/17   Parrett, Fonnie Mu, NP  apixaban (ELIQUIS) 5 MG TABS tablet Take 5 mg by mouth 2 (two) times daily.    [provider]  Chlorphen-Phenyleph-ASA (ALKA-SELTZER PLUS COLD PO) Take 2 tablets by mouth at bedtime as needed (COUGH).    [provider]  dexamethasone (DECADRON) 4 MG tablet Take 4 mg by mouth 2 (two) times daily. The day before, the day of, and the day after chemotherapy every three weeks    [provider]  Dextromethorphan-guaiFENesin (TUSSIN DM) 10-100 MG/5ML liquid Take 5 mLs by mouth every 12 (twelve) hours.    [provider]  escitalopram (LEXAPRO) 5 MG tablet Take 1 tablet (5 mg total) by mouth at bedtime. 04/17/17   Rosita Fire, MD  feeding supplement, ENSURE ENLIVE, (ENSURE ENLIVE) LIQD Take 237 mLs by mouth 3 (three) times daily between meals. 03/19/17   Shelly Coss, MD  ferrous sulfate 325 (65 FE) MG tablet Take 1 tablet (325 mg total) by mouth 2 (two) times daily with a meal. 07/12/17   Lavina Hamman, MD  Fluticasone-Umeclidin-Vilant (TRELEGY ELLIPTA) 100-62.5-25 MCG/INH AEPB Inhale 1 puff into the lungs daily. 05/31/17   Aline August, MD  folic acid (FOLVITE) 1 MG tablet Take 1 tablet (1 mg total) by mouth daily. 07/13/17   Lavina Hamman, MD  furosemide (LASIX) 40 MG tablet Take 1 tablet (40 mg total) by mouth daily. 07/13/17   Lavina Hamman, MD  gabapentin (NEURONTIN) 100 MG capsule Take 1 capsule (100 mg total) by mouth 3 (three) times daily. 07/12/17   Lavina Hamman, MD  guaiFENesin (MUCINEX) 600 MG 12 hr tablet Take 1,200 mg by mouth 2 (two) times daily.     [provider]  hydrOXYzine (ATARAX/VISTARIL)  10 MG tablet Take 1 tablet (10 mg total) by mouth 3 (three) times daily as needed for anxiety. 07/25/17   Donne Hazel, MD  ipratropium-albuterol (DUONEB) 0.5-2.5 (3) MG/3ML SOLN Take 3 mLs by nebulization 4 (four) times daily. 07/28/17   Lavina Hamman, MD  loratadine (CLARITIN) 10 MG tablet Take 10 mg by mouth daily.    [provider]  metoprolol tartrate (LOPRESSOR) 25 MG tablet Take 12.5 mg by mouth 2 (two) times daily.     [provider]  Multiple Vitamin (THERA-TABS PO) Take 1 tablet by mouth daily.    [provider]  ondansetron (ZOFRAN-ODT) 4 MG disintegrating tablet Take 4 mg by mouth every 8 (eight) hours as needed for nausea or vomiting.    [provider]  Oxycodone HCl 10 MG TABS Take 1 tablet (10 mg total) by mouth every 4 (four) hours as needed for up to 15 doses (pain/shortness of breath). 07/13/17   McDonald, Mia A, PA-C  oxyCODONE-acetaminophen (PERCOCET) 5-325 MG tablet Take 1 tablet by mouth every 4 (four) hours as needed for severe  pain. 08/01/17   Daleen Bo, MD  pantoprazole (PROTONIX) 40 MG tablet Take 1 tablet (40 mg total) by mouth 2 (two) times daily. 12/18/16   Eugenie Filler, MD  polyethylene glycol [facility] / Floria Raveling) packet Take 17 g by mouth daily. 07/29/17   Lavina Hamman, MD  potassium chloride (K-DUR) 10 MEQ tablet Take 2 tablets (20 mEq total) by mouth daily. 07/28/17   Lavina Hamman, MD  senna (SENOKOT) 8.6 MG TABS tablet Take 1 tablet by mouth 2 (two) times daily.    [provider]  senna-docusate (SENOKOT-S) 8.6-50 MG tablet Take 2 tablets by mouth 2 (two) times daily. Patient taking differently: Take 1 tablet by mouth 2 (two) times daily.  07/12/17   Lavina Hamman, MD  senna-docusate (SENOKOT-S) 8.6-50 MG tablet Take 2 tablets by mouth 2 (two) times daily.    [provider]  sucralfate (CARAFATE) 1 GM/10ML suspension Take 10 mLs (1 g total) by mouth 4 (four) times daily -  with meals and at  bedtime. 07/12/17   Lavina Hamman, MD  vitamin B-12 (CYANOCOBALAMIN) 1000 MCG tablet Take 1 tablet (1,000 mcg total) by mouth daily. 07/28/17   Lavina Hamman, MD    Family History Family History  Problem Relation Age of Onset  . Cancer Father   . Diabetes Mellitus II Mother     Social History Social History   Tobacco Use  . Smoking status: Current Some Day Smoker    Packs/day: 0.10    Years: 30.00    Pack years: 3.00    Types: Cigarettes  . Smokeless tobacco: Never Used  . Tobacco comment: 1 cig a week  Substance Use Topics  . Alcohol use: No    Frequency: Never  . Drug use: No     Allergies   Patient has no known allergies.   Review of Systems Review of Systems  All other systems reviewed and are negative.    Physical Exam Updated Vital Signs BP 137/90 (BP Location: Right Arm)   Pulse (!) 104   Temp 98 F (36.7 C) (Oral)   Resp 14   SpO2 100%   Physical Exam  Constitutional: He is oriented to person, place, and time. He appears well-developed.  He appears under nourished and older than stated age.  HENT:  Head: Normocephalic and atraumatic.  Right Ear: External ear normal.  Left Ear: External ear normal.  Eyes: Pupils are equal, round, and reactive to light. Conjunctivae and EOM are normal.  Neck: Normal range of motion and phonation normal. Neck supple.  Cardiovascular: Normal rate, regular rhythm and normal heart sounds.  Pulmonary/Chest: Effort normal and breath sounds normal. He exhibits no bony tenderness.  Abdominal: Soft. Bowel sounds are normal. There is tenderness (Diffuse, mild). There is no guarding.  Musculoskeletal: Normal range of motion.  Neurological: He is alert and oriented to person, place, and time. No cranial nerve deficit or sensory deficit. He exhibits normal muscle tone. Coordination normal.  Skin: Skin is warm, dry and intact.  Psychiatric: He has a normal mood and affect. His behavior is normal. Judgment and thought content  normal.  Nursing note and vitals reviewed.    ED Treatments / Results  Labs (all labs ordered are listed, but only abnormal results are displayed) Labs Reviewed - No data to display  EKG None  Radiology Dg Chest 2 View  Result Date: 07/31/2017 CLINICAL DATA:  Initial evaluation for acute generalized chest pain, shortness of breath. EXAM:  CHEST - 2 VIEW COMPARISON:  Prior radiograph and CT from 07/29/2017 FINDINGS: Right-sided Port-A-Cath in place with tip in the proximal right atrium. Cardiac and mediastinal silhouettes are stable, and remain within normal limits. Lungs normally inflated. Persistent right greater than left layering pleural effusions, stable. Associated right basilar opacity also unchanged. Left suprahilar nodular density is unchanged. No new focal airspace disease. No pneumothorax. Underlying emphysema noted. Osseous structures unchanged. IMPRESSION: 1. Stable appearance of the chest with small right greater than left layering pleural effusions with associated right basilar opacity, likely atelectasis. 2. Underlying emphysema. Electronically Signed   By: Jeannine Boga M.D.   On: 07/31/2017 20:23   US Abdomen Limited  Result Date: 07/31/2017 CLINICAL DATA:  57 year old male with abdominal distension. Assess for ascites. EXAM: LIMITED ABDOMEN ULTRASOUND FOR ASCITES TECHNIQUE: Limited ultrasound survey for ascites was performed in all four abdominal quadrants. COMPARISON:  Prior abdominal ultrasound 07/27/2017 FINDINGS: Sonographic evaluation of the 4 quadrants of the abdomen demonstrates trace ascites. There is insufficient fluid for paracentesis. IMPRESSION: Trace ascites, insufficient for paracentesis. Electronically Signed   By: Jacqulynn Cadet M.D.   On: 07/31/2017 11:56   Dg Chest Portable 1 View  Result Date: 08/01/2017 CLINICAL DATA:  Shortness of breath with stage IV lung cancer. EXAM: PORTABLE CHEST 1 VIEW COMPARISON:  07/31/2017 FINDINGS: 1150 hours. Lungs  are hyperexpanded. Small bilateral pleural effusions again noted. There is stable appearance of interstitial prominence and collapse/consolidation at the right base. The left suprahilar nodule appears unchanged. The cardiopericardial silhouette is within normal limits for size. Right Port-A-Cath is stable. Telemetry leads overlie the chest. IMPRESSION: Stable exam. Emphysema with small bilateral pleural effusions, collapse/consolidation at the right base and left suprahilar nodule. Electronically Signed   By: Misty Stanley M.D.   On: 08/01/2017 12:31    Procedures Procedures (including critical care time)  Medications Ordered in ED Medications  oxyCODONE-acetaminophen (PERCOCET/ROXICET) 5-325 MG per tablet 1 tablet (1 tablet Oral Given 08/01/17 2038)  oxyCODONE-acetaminophen (PERCOCET/ROXICET) 5-325 MG per tablet 1 tablet (1 tablet Oral Given 08/01/17 2038)     Initial Impression / Assessment and Plan / ED Course  I have reviewed the triage vital signs and the nursing notes.  Pertinent labs & imaging results that were available during my care of the patient were reviewed by me and considered in my medical decision making (see chart for details).      Patient Vitals for the past 24 hrs:  BP Temp Temp src Pulse Resp SpO2  08/01/17 1959 137/90 98 F (36.7 C) Oral (!) 104 14 100 %    8:47 PM Reevaluation with update and discussion. After initial assessment and treatment, an updated evaluation reveals can. Daleen Bo   Medical Decision Making: The patient is here for evaluation of shortness of breath and abdominal pain.  These are ongoing complaints for this patient who has numerous ED visits.  He is currently being treated with palliative chemotherapy and radiation for stage IV lung cancer.  He is not currently being prescribed narcotic pain medications.  Documentation indicates that he has a palliative consult however it cannot be located in Epic.  I do not see a preclusion to prescribing  narcotic pain medicine for this patient.  He was evaluated earlier today, comprehensively, for shortness of breath with suspected COPD exacerbation.  Currently has reassuring evaluation in the ED with acceptable vital signs.  Patient denies nausea, vomiting, diarrhea or constipation.  He has an upcoming chemotherapy session, scheduled for 08/05/2017.  He  will be given a short-term prescription for narcotic pain relief.    CRITICAL CARE-no Performed by: Daleen Bo  Nursing Notes Reviewed/ Care Coordinated Applicable Imaging Reviewed Interpretation of Laboratory Data incorporated into ED treatment  The patient appears reasonably screened and/or stabilized for discharge and I doubt any other medical condition or other [facility] requiring further screening, evaluation, or treatment in the ED at this time prior to discharge.  Plan: Home Medications-continue current medications; Home Treatments-rest, fluids; return here if the recommended treatment, does not improve the symptoms; Recommended follow up-oncology follow-up as scheduled.  Follow-up with PCP and/or palliative medicine and/or hospice care for assistance with ongoing medical management.    Final Clinical Impressions(s) / ED Diagnoses   Final diagnoses:  Generalized abdominal pain  Malignant neoplasm of lung, unspecified laterality, unspecified part of lung [facility])    ED Discharge Orders        Ordered    oxyCODONE-acetaminophen (PERCOCET) 5-325 MG tablet  Every 4 hours PRN     08/01/17 2046      Daleen Bo, MD 08/01/17 2047

## 2017-08-01 NOTE — ED Notes (Signed)
Bed: WA01 Expected date:  Expected time:  Means of arrival:  Comments: EMS from [facility]-d/c from here 2 hours ago-cancer patient-still has abd distention and pain

## 2017-08-01 NOTE — ED Triage Notes (Signed)
Pt is c/o abdominal pain and distension, also states he feels anxious "because he has breathing problems."

## 2017-08-01 NOTE — ED Notes (Signed)
Bed: WHALA Expected date:  Expected time:  Means of arrival:  Comments: 

## 2017-08-04 ENCOUNTER — Other Ambulatory Visit (HOSPITAL_COMMUNITY): Payer: Medicaid Other

## 2017-08-04 ENCOUNTER — Encounter (HOSPITAL_COMMUNITY): Payer: Self-pay | Admitting: Internal Medicine

## 2017-08-04 ENCOUNTER — Inpatient Hospital Stay (HOSPITAL_COMMUNITY)
Admission: EM | Admit: 2017-08-04 | Discharge: 2017-08-09 | DRG: 190 | Disposition: A | Discharge status: Home Health | Payer: Medicaid Other | Attending: Internal Medicine | Admitting: Internal Medicine

## 2017-08-04 ENCOUNTER — Telehealth: Payer: Self-pay | Admitting: Medical Oncology

## 2017-08-04 ENCOUNTER — Emergency Department (HOSPITAL_COMMUNITY): Payer: Medicaid Other

## 2017-08-04 DIAGNOSIS — Z515 Encounter for palliative care: Secondary | ICD-10-CM | POA: Diagnosis not present

## 2017-08-04 DIAGNOSIS — Z809 Family history of malignant neoplasm, unspecified: Secondary | ICD-10-CM | POA: Diagnosis not present

## 2017-08-04 DIAGNOSIS — I5033 Acute on chronic diastolic (congestive) heart failure: Secondary | ICD-10-CM | POA: Diagnosis present

## 2017-08-04 DIAGNOSIS — K219 Gastro-esophageal reflux disease without esophagitis: Secondary | ICD-10-CM | POA: Diagnosis present

## 2017-08-04 DIAGNOSIS — G40909 Epilepsy, unspecified, not intractable, without status epilepticus: Secondary | ICD-10-CM | POA: Diagnosis present

## 2017-08-04 DIAGNOSIS — Z8679 Personal history of other diseases of the circulatory system: Secondary | ICD-10-CM

## 2017-08-04 DIAGNOSIS — Z66 Do not resuscitate: Secondary | ICD-10-CM | POA: Diagnosis present

## 2017-08-04 DIAGNOSIS — Z7901 Long term (current) use of anticoagulants: Secondary | ICD-10-CM

## 2017-08-04 DIAGNOSIS — Z833 Family history of diabetes mellitus: Secondary | ICD-10-CM

## 2017-08-04 DIAGNOSIS — I10 Essential (primary) hypertension: Secondary | ICD-10-CM | POA: Diagnosis not present

## 2017-08-04 DIAGNOSIS — R05 Cough: Secondary | ICD-10-CM | POA: Diagnosis not present

## 2017-08-04 DIAGNOSIS — T451X5A Adverse effect of antineoplastic and immunosuppressive drugs, initial encounter: Secondary | ICD-10-CM | POA: Diagnosis present

## 2017-08-04 DIAGNOSIS — R64 Cachexia: Secondary | ICD-10-CM | POA: Diagnosis present

## 2017-08-04 DIAGNOSIS — I130 Hypertensive heart and chronic kidney disease with heart failure and stage 1 through stage 4 chronic kidney disease, or unspecified chronic kidney disease: Secondary | ICD-10-CM | POA: Diagnosis present

## 2017-08-04 DIAGNOSIS — D696 Thrombocytopenia, unspecified: Secondary | ICD-10-CM | POA: Diagnosis present

## 2017-08-04 DIAGNOSIS — R059 Cough, unspecified: Secondary | ICD-10-CM

## 2017-08-04 DIAGNOSIS — Z86711 Personal history of pulmonary embolism: Secondary | ICD-10-CM | POA: Diagnosis not present

## 2017-08-04 DIAGNOSIS — Z7951 Long term (current) use of inhaled steroids: Secondary | ICD-10-CM | POA: Diagnosis not present

## 2017-08-04 DIAGNOSIS — J441 Chronic obstructive pulmonary disease with (acute) exacerbation: Principal | ICD-10-CM | POA: Diagnosis present

## 2017-08-04 DIAGNOSIS — Z9981 Dependence on supplemental oxygen: Secondary | ICD-10-CM | POA: Diagnosis not present

## 2017-08-04 DIAGNOSIS — J9611 Chronic respiratory failure with hypoxia: Secondary | ICD-10-CM | POA: Diagnosis present

## 2017-08-04 DIAGNOSIS — R0602 Shortness of breath: Secondary | ICD-10-CM | POA: Diagnosis not present

## 2017-08-04 DIAGNOSIS — D631 Anemia in chronic kidney disease: Secondary | ICD-10-CM | POA: Diagnosis present

## 2017-08-04 DIAGNOSIS — Z7189 Other specified counseling: Secondary | ICD-10-CM | POA: Diagnosis not present

## 2017-08-04 DIAGNOSIS — D649 Anemia, unspecified: Secondary | ICD-10-CM | POA: Diagnosis present

## 2017-08-04 DIAGNOSIS — F419 Anxiety disorder, unspecified: Secondary | ICD-10-CM | POA: Diagnosis present

## 2017-08-04 DIAGNOSIS — F17210 Nicotine dependence, cigarettes, uncomplicated: Secondary | ICD-10-CM | POA: Diagnosis present

## 2017-08-04 DIAGNOSIS — G8929 Other chronic pain: Secondary | ICD-10-CM | POA: Diagnosis present

## 2017-08-04 DIAGNOSIS — J9621 Acute and chronic respiratory failure with hypoxia: Secondary | ICD-10-CM | POA: Diagnosis present

## 2017-08-04 DIAGNOSIS — R0600 Dyspnea, unspecified: Secondary | ICD-10-CM | POA: Diagnosis not present

## 2017-08-04 DIAGNOSIS — N183 Chronic kidney disease, stage 3 (moderate): Secondary | ICD-10-CM | POA: Diagnosis present

## 2017-08-04 DIAGNOSIS — C3492 Malignant neoplasm of unspecified part of left bronchus or lung: Secondary | ICD-10-CM | POA: Diagnosis present

## 2017-08-04 DIAGNOSIS — Z85118 Personal history of other malignant neoplasm of bronchus and lung: Secondary | ICD-10-CM | POA: Diagnosis not present

## 2017-08-04 DIAGNOSIS — I2699 Other pulmonary embolism without acute cor pulmonale: Secondary | ICD-10-CM | POA: Diagnosis not present

## 2017-08-04 DIAGNOSIS — E43 Unspecified severe protein-calorie malnutrition: Secondary | ICD-10-CM | POA: Diagnosis present

## 2017-08-04 DIAGNOSIS — Z681 Body mass index (BMI) 19 or less, adult: Secondary | ICD-10-CM

## 2017-08-04 HISTORY — DX: Other pulmonary embolism without acute cor pulmonale: I26.99

## 2017-08-04 LAB — CBC
HEMATOCRIT: 24.3 % — AB (ref 39.0–52.0)
Hemoglobin: 8 g/dL — ABNORMAL LOW (ref 13.0–17.0)
MCH: 33.3 pg (ref 26.0–34.0)
MCHC: 32.9 g/dL (ref 30.0–36.0)
MCV: 101.3 fL — ABNORMAL HIGH (ref 78.0–100.0)
PLATELETS: 119 10*3/uL — AB (ref 150–400)
RBC: 2.4 MIL/uL — ABNORMAL LOW (ref 4.22–5.81)
RDW: 17.5 % — AB (ref 11.5–15.5)
WBC: 9.2 10*3/uL (ref 4.0–10.5)

## 2017-08-04 LAB — BASIC METABOLIC PANEL
ANION GAP: 9 (ref 5–15)
BUN: 16 mg/dL (ref 6–20)
CO2: 35 mmol/L — ABNORMAL HIGH (ref 22–32)
Calcium: 9.2 mg/dL (ref 8.9–10.3)
Chloride: 99 mmol/L (ref 98–111)
Creatinine, Ser: 1.55 mg/dL — ABNORMAL HIGH (ref 0.61–1.24)
GFR calc Af Amer: 55 mL/min — ABNORMAL LOW (ref >60)
GFR calc non Af Amer: 48 mL/min — ABNORMAL LOW (ref >60)
Glucose, Bld: 81 mg/dL (ref 70–99)
POTASSIUM: 3.8 mmol/L (ref 3.5–5.1)
SODIUM: 143 mmol/L (ref 135–145)

## 2017-08-04 LAB — CBC WITH DIFFERENTIAL/PLATELET
BASOS ABS: 0 10*3/uL (ref 0.0–0.1)
Basophils Relative: 0 %
Eosinophils Absolute: 0 10*3/uL (ref 0.0–0.7)
Eosinophils Relative: 1 %
HCT: 22.8 % — ABNORMAL LOW (ref 39.0–52.0)
Hemoglobin: 7.2 g/dL — ABNORMAL LOW (ref 13.0–17.0)
LYMPHS ABS: 0.8 10*3/uL (ref 0.7–4.0)
LYMPHS PCT: 13 %
MCH: 32.4 pg (ref 26.0–34.0)
MCHC: 31.6 g/dL (ref 30.0–36.0)
MCV: 102.7 fL — AB (ref 78.0–100.0)
MONO ABS: 0.6 10*3/uL (ref 0.1–1.0)
Monocytes Relative: 9 %
NEUTROS ABS: 5 10*3/uL (ref 1.7–7.7)
Neutrophils Relative %: 77 %
Platelets: 113 10*3/uL — ABNORMAL LOW (ref 150–400)
RBC: 2.22 MIL/uL — AB (ref 4.22–5.81)
RDW: 17.7 % — AB (ref 11.5–15.5)
WBC: 6.4 10*3/uL (ref 4.0–10.5)

## 2017-08-04 LAB — FUNGUS CULTURE WITH STAIN

## 2017-08-04 LAB — BRAIN NATRIURETIC PEPTIDE: B NATRIURETIC PEPTIDE 5: 48.1 pg/mL (ref 0.0–100.0)

## 2017-08-04 LAB — I-STAT TROPONIN, ED: Troponin i, poc: 0.01 ng/mL (ref 0.00–0.08)

## 2017-08-04 LAB — FUNGAL ORGANISM REFLEX

## 2017-08-04 LAB — FUNGUS CULTURE RESULT

## 2017-08-04 MED ORDER — POLYETHYLENE GLYCOL 3350 17 G PO PACK
17.0000 g | PACK | Freq: Every day | ORAL | Status: DC
  Administered 2017-08-07: 17 g via ORAL
  Filled 2017-08-04 (×4): qty 1

## 2017-08-04 MED ORDER — ACETAMINOPHEN 650 MG RE SUPP: 650.0000 mg | Freq: Four times a day (QID) | RECTAL | Status: DC | PRN

## 2017-08-04 MED ORDER — FLUTICASONE-UMECLIDIN-VILANT 100-62.5-25 MCG/INH IN AEPB: 1.0000 | INHALATION_SPRAY | Freq: Every day | RESPIRATORY_TRACT | Status: DC

## 2017-08-04 MED ORDER — FUROSEMIDE 40 MG PO TABS
40.0000 mg | ORAL_TABLET | Freq: Every day | ORAL | Status: DC
  Administered 2017-08-05 – 2017-08-06 (×2): 40 mg via ORAL
  Filled 2017-08-04 (×2): qty 1

## 2017-08-04 MED ORDER — HYDROXYZINE HCL 10 MG PO TABS
10.0000 mg | ORAL_TABLET | Freq: Three times a day (TID) | ORAL | Status: DC | PRN
  Administered 2017-08-05 – 2017-08-06 (×4): 10 mg via ORAL
  Filled 2017-08-04 (×4): qty 1

## 2017-08-04 MED ORDER — SODIUM CHLORIDE 0.9 % IV SOLN
500.0000 mg | INTRAVENOUS | Status: DC
  Administered 2017-08-04 – 2017-08-06 (×3): 500 mg via INTRAVENOUS
  Filled 2017-08-04 (×3): qty 500

## 2017-08-04 MED ORDER — FLUTICASONE FUROATE-VILANTEROL 100-25 MCG/INH IN AEPB
1.0000 | INHALATION_SPRAY | Freq: Every day | RESPIRATORY_TRACT | Status: DC
  Administered 2017-08-05 – 2017-08-09 (×5): 1 via RESPIRATORY_TRACT
  Filled 2017-08-04: qty 28

## 2017-08-04 MED ORDER — OXYCODONE-ACETAMINOPHEN 5-325 MG PO TABS
1.0000 | ORAL_TABLET | ORAL | Status: DC | PRN
  Administered 2017-08-05 – 2017-08-07 (×6): 1 via ORAL
  Filled 2017-08-04 (×7): qty 1

## 2017-08-04 MED ORDER — IPRATROPIUM BROMIDE 0.02 % IN SOLN
0.5000 mg | Freq: Once | RESPIRATORY_TRACT | Status: AC
  Administered 2017-08-04: 0.5 mg via RESPIRATORY_TRACT
  Filled 2017-08-04: qty 2.5

## 2017-08-04 MED ORDER — LORATADINE 10 MG PO TABS
10.0000 mg | ORAL_TABLET | Freq: Every day | ORAL | Status: DC
  Administered 2017-08-05 – 2017-08-07 (×2): 10 mg via ORAL
  Filled 2017-08-04 (×3): qty 1

## 2017-08-04 MED ORDER — MAGNESIUM SULFATE 2 GM/50ML IV SOLN
2.0000 g | Freq: Once | INTRAVENOUS | Status: AC
  Administered 2017-08-04: 2 g via INTRAVENOUS
  Filled 2017-08-04: qty 50

## 2017-08-04 MED ORDER — UMECLIDINIUM BROMIDE 62.5 MCG/INH IN AEPB
1.0000 | INHALATION_SPRAY | Freq: Every day | RESPIRATORY_TRACT | Status: DC
  Administered 2017-08-05 – 2017-08-09 (×5): 1 via RESPIRATORY_TRACT
  Filled 2017-08-04: qty 7

## 2017-08-04 MED ORDER — FOLIC ACID 1 MG PO TABS
1.0000 mg | ORAL_TABLET | Freq: Every day | ORAL | Status: DC
  Administered 2017-08-05 – 2017-08-07 (×2): 1 mg via ORAL
  Filled 2017-08-04 (×3): qty 1

## 2017-08-04 MED ORDER — IPRATROPIUM-ALBUTEROL 0.5-2.5 (3) MG/3ML IN SOLN: 3.0000 mL | Freq: Once | RESPIRATORY_TRACT | Status: DC

## 2017-08-04 MED ORDER — GABAPENTIN 100 MG PO CAPS
100.0000 mg | ORAL_CAPSULE | Freq: Three times a day (TID) | ORAL | Status: DC
  Administered 2017-08-05 – 2017-08-09 (×9): 100 mg via ORAL
  Filled 2017-08-04 (×13): qty 1

## 2017-08-04 MED ORDER — SENNOSIDES-DOCUSATE SODIUM 8.6-50 MG PO TABS
1.0000 | ORAL_TABLET | Freq: Two times a day (BID) | ORAL | Status: DC
  Administered 2017-08-05 – 2017-08-07 (×4): 1 via ORAL
  Filled 2017-08-04 (×8): qty 1

## 2017-08-04 MED ORDER — FUROSEMIDE 10 MG/ML IJ SOLN
40.0000 mg | Freq: Once | INTRAMUSCULAR | Status: AC
  Administered 2017-08-04: 40 mg via INTRAVENOUS
  Filled 2017-08-04: qty 4

## 2017-08-04 MED ORDER — THERA-TABS PO TABS: ORAL_TABLET | Freq: Every day | ORAL | Status: DC

## 2017-08-04 MED ORDER — ALBUTEROL SULFATE (2.5 MG/3ML) 0.083% IN NEBU
2.5000 mg | INHALATION_SOLUTION | Freq: Four times a day (QID) | RESPIRATORY_TRACT | Status: DC | PRN
  Administered 2017-08-05 – 2017-08-06 (×3): 2.5 mg via RESPIRATORY_TRACT
  Filled 2017-08-04 (×3): qty 3

## 2017-08-04 MED ORDER — ESCITALOPRAM OXALATE 10 MG PO TABS
5.0000 mg | ORAL_TABLET | Freq: Every day | ORAL | Status: DC
  Administered 2017-08-04 – 2017-08-08 (×4): 5 mg via ORAL
  Filled 2017-08-04 (×5): qty 1

## 2017-08-04 MED ORDER — FERROUS SULFATE 325 (65 FE) MG PO TABS
325.0000 mg | ORAL_TABLET | Freq: Two times a day (BID) | ORAL | Status: DC
  Administered 2017-08-05 – 2017-08-09 (×5): 325 mg via ORAL
  Filled 2017-08-04 (×7): qty 1

## 2017-08-04 MED ORDER — METOPROLOL TARTRATE 25 MG PO TABS
12.5000 mg | ORAL_TABLET | Freq: Two times a day (BID) | ORAL | Status: DC
  Administered 2017-08-04 – 2017-08-09 (×9): 12.5 mg via ORAL
  Filled 2017-08-04 (×10): qty 1

## 2017-08-04 MED ORDER — SODIUM CHLORIDE 0.9% FLUSH
3.0000 mL | Freq: Two times a day (BID) | INTRAVENOUS | Status: DC
  Administered 2017-08-04 – 2017-08-08 (×4): 3 mL via INTRAVENOUS

## 2017-08-04 MED ORDER — APIXABAN 5 MG PO TABS
5.0000 mg | ORAL_TABLET | Freq: Two times a day (BID) | ORAL | Status: DC
  Administered 2017-08-05 – 2017-08-09 (×8): 5 mg via ORAL
  Filled 2017-08-04 (×10): qty 1

## 2017-08-04 MED ORDER — METHYLPREDNISOLONE SODIUM SUCC 125 MG IJ SOLR
125.0000 mg | Freq: Once | INTRAMUSCULAR | Status: AC
  Administered 2017-08-04: 125 mg via INTRAVENOUS
  Filled 2017-08-04: qty 2

## 2017-08-04 MED ORDER — ADULT MULTIVITAMIN W/MINERALS CH
1.0000 | ORAL_TABLET | Freq: Every day | ORAL | Status: DC
  Administered 2017-08-07 – 2017-08-09 (×2): 1 via ORAL
  Filled 2017-08-04 (×4): qty 1

## 2017-08-04 MED ORDER — ENSURE ENLIVE PO LIQD
237.0000 mL | Freq: Three times a day (TID) | ORAL | Status: DC
  Administered 2017-08-05: 237 mL via ORAL

## 2017-08-04 MED ORDER — PANTOPRAZOLE SODIUM 40 MG PO TBEC
40.0000 mg | DELAYED_RELEASE_TABLET | Freq: Two times a day (BID) | ORAL | Status: DC
  Administered 2017-08-04 – 2017-08-08 (×8): 40 mg via ORAL
  Filled 2017-08-04 (×9): qty 1

## 2017-08-04 MED ORDER — ONDANSETRON 4 MG PO TBDP: 4.0000 mg | ORAL_TABLET | Freq: Three times a day (TID) | ORAL | Status: DC | PRN

## 2017-08-04 MED ORDER — SODIUM CHLORIDE 0.9% FLUSH: 3.0000 mL | INTRAVENOUS | Status: DC | PRN

## 2017-08-04 MED ORDER — ALBUTEROL SULFATE (2.5 MG/3ML) 0.083% IN NEBU
2.5000 mg | INHALATION_SOLUTION | Freq: Four times a day (QID) | RESPIRATORY_TRACT | Status: DC
  Administered 2017-08-05: 2.5 mg via RESPIRATORY_TRACT
  Filled 2017-08-04: qty 3

## 2017-08-04 MED ORDER — ACETAMINOPHEN 325 MG PO TABS: 650.0000 mg | ORAL_TABLET | Freq: Four times a day (QID) | ORAL | Status: DC | PRN

## 2017-08-04 MED ORDER — ALBUTEROL (5 MG/ML) CONTINUOUS INHALATION SOLN
10.0000 mg/h | INHALATION_SOLUTION | RESPIRATORY_TRACT | Status: DC
  Administered 2017-08-04: 10 mg/h via RESPIRATORY_TRACT
  Filled 2017-08-04: qty 20

## 2017-08-04 MED ORDER — GUAIFENESIN ER 600 MG PO TB12
1200.0000 mg | ORAL_TABLET | Freq: Two times a day (BID) | ORAL | Status: DC
  Administered 2017-08-04: 1200 mg via ORAL
  Filled 2017-08-04: qty 2

## 2017-08-04 MED ORDER — MAGNESIUM SULFATE 50 % IJ SOLN
2.0000 g | Freq: Once | INTRAMUSCULAR | Status: DC
  Filled 2017-08-04: qty 4

## 2017-08-04 MED ORDER — SODIUM CHLORIDE 0.9% FLUSH
10.0000 mL | INTRAVENOUS | Status: DC | PRN
  Administered 2017-08-04 – 2017-08-08 (×4): 10 mL
  Filled 2017-08-04 (×4): qty 40

## 2017-08-04 MED ORDER — METHYLPREDNISOLONE SODIUM SUCC 125 MG IJ SOLR
80.0000 mg | Freq: Three times a day (TID) | INTRAMUSCULAR | Status: DC
  Administered 2017-08-05: 80 mg via INTRAVENOUS
  Filled 2017-08-04: qty 2

## 2017-08-04 MED ORDER — POTASSIUM CHLORIDE ER 10 MEQ PO TBCR
20.0000 meq | EXTENDED_RELEASE_TABLET | Freq: Every day | ORAL | Status: DC
  Administered 2017-08-05 – 2017-08-09 (×5): 20 meq via ORAL
  Filled 2017-08-04 (×10): qty 2

## 2017-08-04 MED ORDER — SODIUM CHLORIDE 0.9 % IV SOLN: 250.0000 mL | INTRAVENOUS | Status: DC | PRN

## 2017-08-04 NOTE — H&P (Addendum)
TRH H&P   Patient Demographics:    [name], is a 57 y.o. male  MRN: [id number]   DOB - [birth year]  Admit Date - 08/04/2017  Outpatient Primary MD for the patient is [name], MD  Referring MD/NP/PA:  Alferd Apa  Outpatient Specialists:  [facility]  Patient coming from:  home  Chief Complaint  Patient presents with  . Shortness of Breath      HPI:    [name]  is a 57 y.o. male, w hypertension, seizure do, stage 4 lung cancer, pulmonary embolism, Copd on home o2, apparently c/o dyspnea for the past 3 days, along with dry cough.  Pt denies fever, chills, cp, palp, n/v, diarrhea, brbpr.   In ED, pt desaturated into the 70-80% with walking CXR IMPRESSION: 1. Mild diffuse prominence of the central interstitial markings, slightly increased, cannot exclude mild pulmonary edema. 2. Stable small left upper parahilar nodular opacity compatible with known neoplasm. 3. Stable small right and trace left pleural effusions. 4. Stable patchy right lung base opacity, favor atelectasis. 5. Hyperinflated lungs and emphysema, suggesting COPD   Wbc 6.4, Hgb 7.2, Plt 113  Na 143, k 3.8, Bun 16, Creatinine 1.55 BNP 48.1 Trop 0.01  Pt will be admitted for Copd exacerbation and anemia.     Review of systems:    In addition to the HPI above, No Fever-chills, No Headache, No changes with Vision or hearing, No problems swallowing food or Liquids, No Chest pain, No Abdominal pain, No Nausea or Vommitting, Bowel movements are regular, No Blood in stool or Urine, No dysuria, No new skin rashes or bruises, No new joints pains-aches,  No new weakness, tingling, numbness in any extremity, No recent weight gain or loss, No polyuria, polydypsia or polyphagia, No significant Mental Stressors.  A full 10 point Review of Systems was done, except as stated above,  all other Review of Systems were negative.   With Past History of the following :    Past Medical History:  Diagnosis Date  . Abdominal pain 06/04/2016  . Adenocarcinoma of left lung, stage 4 ([place]) 05/02/2016  . Alcohol abuse   . Bronchitis due to tobacco use ([place])   . Cancer ([place])    Lung  . COPD (chronic obstructive pulmonary disease) ([place])    on home o2 2LNC   . Dehydration 06/04/2016  . Encounter for antineoplastic chemotherapy 05/02/2016  . Gastritis   . Goals of care, counseling/discussion 05/02/2016  . Hematemesis   . HTN (hypertension) 10/30/2016  . Pulmonary embolism ([place]) 06/30/2017   on CTA chest  . Recurrent upper respiratory infection (URI)   . Seizures ([place]) 05/2011   new onset      Past Surgical History:  Procedure Laterality Date  . ESOPHAGOGASTRODUODENOSCOPY (EGD) WITH PROPOFOL N/A 12/17/2016   Procedure: ESOPHAGOGASTRODUODENOSCOPY (EGD) WITH PROPOFOL;  Surgeon: Ladene Artist, MD;  Location: [facility];  Service: Endoscopy;  Laterality: N/A;  . IR FLUORO GUIDE PORT INSERTION RIGHT  05/13/2016  . IR US GUIDE VASC ACCESS RIGHT  05/13/2016  . NO PAST SURGERIES    . THORACENTESIS  07/05/2017   ultrasound guided      Social History:     Social History   Tobacco Use  . Smoking status: Current Some Day Smoker    Packs/day: 0.10    Years: 30.00    Pack years: 3.00    Types: Cigarettes  . Smokeless tobacco: Never Used  . Tobacco comment: 1 cig a week  Substance Use Topics  . Alcohol use: No    Frequency: Never     Lives - at [facility] - walks by self   Family History :     Family History  Problem Relation Age of Onset  . Cancer Father   . Diabetes Mellitus II Mother        Home Medications:   Prior to Admission medications   Medication Sig Start Date End Date Taking? Authorizing Provider  albuterol (PROVENTIL) (2.5 MG/3ML) 0.083% nebulizer solution 1 neb every 4-6 hours as needed for wheezing and shortness of  breath Patient taking differently: Take 2.5 mg by nebulization every 6 (six) hours as needed for wheezing or shortness of breath.  04/01/17  Yes Parrett, Fonnie Mu, NP  apixaban (ELIQUIS) 5 MG TABS tablet Take 5 mg by mouth 2 (two) times daily.   Yes [provider]  dexamethasone (DECADRON) 4 MG tablet Take 4 mg by mouth 2 (two) times daily. The day before, the day of, and the day after chemotherapy every three weeks   Yes [provider]  Dextromethorphan-guaiFENesin (TUSSIN DM) 10-100 MG/5ML liquid Take 5 mLs by mouth every 12 (twelve) hours.   Yes [provider]  escitalopram (LEXAPRO) 5 MG tablet Take 1 tablet (5 mg total) by mouth at bedtime. 04/17/17  Yes Rosita Fire, MD  feeding supplement, ENSURE ENLIVE, (ENSURE ENLIVE) LIQD Take 237 mLs by mouth 3 (three) times daily between meals. 03/19/17  Yes Shelly Coss, MD  ferrous sulfate 325 (65 FE) MG tablet Take 1 tablet (325 mg total) by mouth 2 (two) times daily with a meal. 07/12/17  Yes Lavina Hamman, MD  Fluticasone-Umeclidin-Vilant (TRELEGY ELLIPTA) 100-62.5-25 MCG/INH AEPB Inhale 1 puff into the lungs daily. 05/31/17  Yes Aline August, MD  folic acid (FOLVITE) 1 MG tablet Take 1 tablet (1 mg total) by mouth daily. 07/13/17  Yes Lavina Hamman, MD  furosemide (LASIX) 40 MG tablet Take 1 tablet (40 mg total) by mouth daily. 07/13/17  Yes Lavina Hamman, MD  gabapentin (NEURONTIN) 100 MG capsule Take 1 capsule (100 mg total) by mouth 3 (three) times daily. 07/12/17  Yes Lavina Hamman, MD  guaiFENesin (MUCINEX) 600 MG 12 hr tablet Take 1,200 mg by mouth 2 (two) times daily.    Yes [provider]  hydrOXYzine (ATARAX/VISTARIL) 10 MG tablet Take 1 tablet (10 mg total) by mouth 3 (three) times daily as needed for anxiety. 07/25/17  Yes Donne Hazel, MD  Ipratropium-Albuterol (COMBIVENT RESPIMAT) 20-100 MCG/ACT AERS respimat Inhale 1 puff into the lungs every 6 (six) hours.   Yes [provider]  ipratropium-albuterol (DUONEB) 0.5-2.5 (3) MG/3ML SOLN Take 3 mLs by nebulization 4 (four) times daily. 07/28/17  Yes Lavina Hamman, MD  loratadine (CLARITIN) 10 MG tablet Take 10 mg by mouth daily.  Yes [provider]  metoprolol tartrate (LOPRESSOR) 25 MG tablet Take 12.5 mg by mouth 2 (two) times daily.    Yes [provider]  Multiple Vitamin (THERA-TABS PO) Take 1 tablet by mouth daily.   Yes [provider]  Oxycodone HCl 10 MG TABS Take 1 tablet (10 mg total) by mouth every 4 (four) hours as needed for up to 15 doses (pain/shortness of breath). 07/13/17  Yes McDonald, Mia A, PA-C  oxyCODONE-acetaminophen (PERCOCET) 5-325 MG tablet Take 1 tablet by mouth every 4 (four) hours as needed for severe pain. 08/01/17  Yes Daleen Bo, MD  pantoprazole (PROTONIX) 40 MG tablet Take 1 tablet (40 mg total) by mouth 2 (two) times daily. 12/18/16  Yes Eugenie Filler, MD  polyethylene glycol [facility] / Floria Raveling) packet Take 17 g by mouth daily. 07/29/17  Yes Lavina Hamman, MD  potassium chloride (K-DUR) 10 MEQ tablet Take 2 tablets (20 mEq total) by mouth daily. 07/28/17  Yes Lavina Hamman, MD  senna (SENOKOT) 8.6 MG TABS tablet Take 1 tablet by mouth 2 (two) times daily.   Yes [provider]  senna-docusate (SENOKOT-S) 8.6-50 MG tablet Take 2 tablets by mouth 2 (two) times daily. Patient taking differently: Take 1 tablet by mouth 2 (two) times daily.  07/12/17  Yes Lavina Hamman, MD  senna-docusate (SENOKOT-S) 8.6-50 MG tablet Take 2 tablets by mouth 2 (two) times daily.   Yes [provider]  sucralfate (CARAFATE) 1 GM/10ML suspension Take 10 mLs (1 g total) by mouth 4 (four) times daily -  with meals and at bedtime. 07/12/17  Yes Lavina Hamman, MD  acetaminophen (TYLENOL) 500 MG tablet Take 1 tablet (500 mg total) by mouth 2 (two) times daily as needed for mild pain. 07/07/17   Florencia Reasons, MD  Chlorphen-Phenyleph-ASA (ALKA-SELTZER PLUS COLD PO) Take  2 tablets by mouth at bedtime as needed (COUGH).    [provider]  ondansetron (ZOFRAN-ODT) 4 MG disintegrating tablet Take 4 mg by mouth every 8 (eight) hours as needed for nausea or vomiting.    [provider]  vitamin B-12 (CYANOCOBALAMIN) 1000 MCG tablet Take 1 tablet (1,000 mcg total) by mouth daily. Patient not taking: Reported on 08/04/2017 07/28/17   Lavina Hamman, MD     Allergies:    No Known Allergies   Physical Exam:   Vitals  Blood pressure (!) 151/81, pulse 68, temperature 98.4 F (36.9 C), temperature source Oral, resp. rate 18, height 5\' 11"  (1.803 m), weight 54.4 kg (120 lb), SpO2 100 %.   1. General  lying in bed in NAD,  cachectic  2. Normal affect and insight, Not Suicidal or Homicidal, Awake Alert, Oriented X 3.  3. No F.N deficits, ALL C.Nerves Intact, Strength 5/5 all 4 extremities, Sensation intact all 4 extremities, Plantars down going.  4. Ears and Eyes appear Normal, Conjunctivae clear, PERRLA. Moist Oral Mucosa.  5. Supple Neck, No JVD, No cervical lymphadenopathy appriciated, No Carotid Bruits.  6. Symmetrical Chest wall movement, +bilateral wheezing, no crackles  7. RRR, No Gallops, Rubs or Murmurs, No Parasternal Heave.  8. Positive Bowel Sounds, Abdomen Soft, No tenderness, No organomegaly appriciated,No rebound -guarding or rigidity.  9.  No Cyanosis, Normal Skin Turgor, No Skin Rash or Bruise.  10. Good muscle tone,  joints appear normal , no effusions, Normal ROM.  11. No Palpable Lymph Nodes in Neck or Axillae      Data Review:    CBC  Recent Labs  Lab 07/29/17 1807 07/30/17 0630 07/30/17 1510 07/31/17 0327 07/31/17 2100 08/01/17 1152 08/04/17 1619  WBC 9.1 10.5  --  10.5 11.4* 12.3* 6.4  HGB 8.1* 6.9* 7.5* 7.6* 7.7* 8.0* 7.2*  HCT 24.7* 21.3* 23.1* 23.9* 24.3* 24.7* 22.8*  PLT 32* 27*  --  38* 45* 58* 113*  MCV 100.4* 100.5*  --  103.0* 102.5* 100.8* 102.7*  MCH 32.9 32.5  --  32.8 32.5 32.7 32.4  MCHC  32.8 32.4  --  31.8 31.7 32.4 31.6  RDW 16.4* 16.6*  --  16.8* 16.6* 16.6* 17.7*  LYMPHSABS 0.3*  --   --   --  0.7  --  0.8  MONOABS 0.2  --   --   --  0.6  --  0.6  EOSABS 0.0  --   --   --  0.0  --  0.0  BASOSABS 0.0  --   --   --  0.0  --  0.0   ------------------------------------------------------------------------------------------------------------------  Chemistries  Recent Labs  Lab 07/29/17 1807 07/30/17 0630 07/31/17 2100 08/01/17 1152 08/04/17 1619  NA 141 142 143 143 143  K 4.6 4.4 3.7 3.6 3.8  CL 97* 100 98 100 99  CO2 35* 34* 39* 33* 35*  GLUCOSE 156* 109* 97 161* 81  BUN 26* 25* 23* 25* 16  CREATININE 1.59* 1.45* 1.50* 1.42* 1.55*  CALCIUM 9.0 8.7* 9.0 9.1 9.2  MG  --  2.0  --   --   --   AST 22 18 24 24   --   ALT 16 14 20 21   --   ALKPHOS 100 83 89 102  --   BILITOT 0.2* 0.6 0.4 0.2*  --    ------------------------------------------------------------------------------------------------------------------ estimated creatinine clearance is 40 mL/min (A) (by C-G formula based on SCr of 1.55 mg/dL (H)). ------------------------------------------------------------------------------------------------------------------ No results for input(s): TSH, T4TOTAL, T3FREE, THYROIDAB in the last 72 hours.  Invalid input(s): FREET3  Coagulation profile Recent Labs  Lab 07/30/17 0630  INR 1.02   ------------------------------------------------------------------------------------------------------------------- No results for input(s): DDIMER in the last 72 hours. -------------------------------------------------------------------------------------------------------------------  Cardiac Enzymes Recent Labs  Lab 07/30/17 0630 07/30/17 1239 07/31/17 2100  TROPONINI 0.03* 0.03* 0.03*   ------------------------------------------------------------------------------------------------------------------    Component Value Date/Time   BNP 48.1 08/04/2017 1621      ---------------------------------------------------------------------------------------------------------------  Urinalysis    Component Value Date/Time   COLORURINE YELLOW 07/16/2017 [place] 07/16/2017 1614   LABSPEC 1.011 07/16/2017 1614   PHURINE 6.0 07/16/2017 1614   GLUCOSEU NEGATIVE 07/16/2017 1614   HGBUR NEGATIVE 07/16/2017 1614   BILIRUBINUR SMALL (A) 07/16/2017 1614   KETONESUR NEGATIVE 07/16/2017 1614   PROTEINUR NEGATIVE 07/16/2017 1614   UROBILINOGEN 0.2 05/29/2011 0122   NITRITE NEGATIVE 07/16/2017 1614   LEUKOCYTESUR NEGATIVE 07/16/2017 1614    ----------------------------------------------------------------------------------------------------------------   Imaging Results:    Dg Chest 2 View  Result Date: 08/04/2017 CLINICAL DATA:  Dyspnea.  Left lung cancer on chemotherapy. EXAM: CHEST - 2 VIEW COMPARISON:  08/01/2017 chest radiograph. FINDINGS: Right internal jugular MediPort terminates at the cavoatrial junction. Stable cardiomediastinal silhouette with top-normal heart size. No pneumothorax. Stable small right pleural effusion. Stable trace left pleural effusion. Hyperinflated lungs and emphysema. Stable small left upper parahilar nodular opacity. Mild diffuse prominence of central interstitial markings, slightly increased. Patchy right lung base opacity is unchanged. No acute consolidative airspace disease. IMPRESSION: 1. Mild diffuse prominence of the central interstitial markings, slightly increased, cannot exclude mild pulmonary edema. 2.  Stable small left upper parahilar nodular opacity compatible with known neoplasm. 3. Stable small right and trace left pleural effusions. 4. Stable patchy right lung base opacity, favor atelectasis. 5. Hyperinflated lungs and emphysema, suggesting COPD. Electronically Signed   By: Ilona Sorrel M.D.   On: 08/04/2017 16:48       Assessment & Plan:    Active Problems:   COPD exacerbation  (HCC)    Dyspnea, Copd exacerbation Solumedrol 80mg  iv q8h zithromax 500mg  iv qday Trelegy 1puff qday Albuterol neb q6h and q6h prn  Cont o2 [place]  H/o Pulmonary Embolism Cont Eliquis  Anemia Cont ferrous sulfate Check ferritin, iron, tibc, b12, folate in am Check cbc in am  Stage 4  lung cancer F/u with [name] outpatient  Hypertension / CHF ? Cont Metoprolol 12.5mg  po bid Cont lasix 40mg  po qday  Chronic pain Cont gabapentin 100mg  po tid Cont percocet prn  Anxiety Cont Lexapro 5mg  po qday  Gerd Cont PPI DC sucralfate, pt states not taking  DVT Prophylaxis Cont eliquis    AM Labs Ordered, also please review Full Orders  Family Communication: Admission, patients condition and plan of care including tests being ordered have been discussed with the patient  who indicate understanding and agree with the plan and Code Status.  Code Status DNR    Likely DC to  ALF  Condition GUARDED    Consults called: none  Admission status: inpatient   Time spent in minutes : 70   Jani Gravel M.D on 08/04/2017 at 8:29 PM  Between 7am to 7pm - Pager - [phone]   After 7pm go to [email or web address] - password [facility]  [facility] - Office  [phone]

## 2017-08-04 NOTE — Telephone Encounter (Signed)
Yes

## 2017-08-04 NOTE — ED Notes (Signed)
ED TO INPATIENT HANDOFF REPORT  Name/Age/Gender [name] 57 y.o. male  Code Status    Code Status Orders  (From admission, onward)        Start     Ordered   08/04/17 2029  Do not attempt resuscitation (DNR)  Continuous    Question Answer Comment  In the event of cardiac or respiratory ARREST Do not call a "code blue"   In the event of cardiac or respiratory ARREST Do not perform Intubation, CPR, defibrillation or ACLS   In the event of cardiac or respiratory ARREST Use medication by any route, position, wound care, and other measures to relive pain and suffering. May use oxygen, suction and manual treatment of airway obstruction as needed for comfort.      08/04/17 2028    Code Status History    Date Active Date Inactive Code Status Order ID Comments User Context   07/29/2017 2348 07/31/2017 1910 DNR [id number]  Toy [place], MD Inpatient   07/27/2017 0701 07/28/2017 1839 DNR [id number]  Jani Gravel, MD ED   07/24/2017 0150 07/25/2017 1703 DNR [id number]  Ivor Costa, MD ED   07/11/2017 0304 07/13/2017 0224 DNR [id number]  Norval Morton, MD ED   07/04/2017 2356 07/09/2017 2151 DNR [id number]  Etta Quill, DO ED   06/30/2017 1015 07/04/2017 0215 DNR [id number]  Shelly Coss, MD ED   06/25/2017 1117 06/29/2017 1603 Full Code [id number]  Shelly Coss, MD ED   06/21/2017 1044 06/23/2017 2044 DNR [id number]  Mariel Aloe, MD ED   06/05/2017 1602 06/10/2017 2253 Full Code [id number]  Cristy Folks, MD Inpatient   05/28/2017 1147 05/31/2017 2012 DNR [id number]  Nita Sells, MD ED   04/10/2017 1148 04/17/2017 1837 Full Code [id number]  Damita Lack, MD ED   03/09/2017 2359 03/19/2017 1931 Full Code [id number]  Cristy Folks, MD Inpatient   02/14/2017 0902 02/16/2017 1455 Full Code [id number]  Florencia Reasons, MD Inpatient   02/10/2017 0031 02/14/2017 0902 DNR [id number]  Norval Morton, MD ED   12/30/2016 2325 01/07/2017 2109 DNR [id number]  Elodia [name]., MD Inpatient   12/17/2016  0934 12/24/2016 1811 DNR [id number]  Thurnell Lose, MD Inpatient   12/16/2016 1649 12/17/2016 0934 Full Code [id number]  Charlynne Cousins, MD Inpatient    Advance Directive Documentation     Most Recent Value  Type of Advance Directive  -- [paperwork from nursing home states pt is a dnr]  Pre-existing out of facility DNR order (yellow form or pink MOST form)  -  "MOST" Form in Place?  -      Home/SNF/Other Nursing Home  Chief Complaint shortness of breath  Level of Care/Admitting Diagnosis ED Disposition    ED Disposition Condition [facility]: [facility] [100102]  Level of Care: Telemetry [5]  Admit to tele based on following criteria: Monitor for Ischemic changes  Diagnosis: COPD exacerbation [facility]) [[id number]]  Admitting Physician: Jani Gravel [[id number]]  Attending Physician: Jani Gravel [phone]  Estimated length of stay: past midnight tomorrow  Certification:: I certify this patient will need inpatient services for at least 2 midnights  PT Class (Do Not Modify): Inpatient [101]  PT Acc Code (Do Not Modify): Private [1]       Medical History Past Medical History:  Diagnosis Date  . Abdominal pain 06/04/2016  . Adenocarcinoma of left lung, stage 4 ([place]) 05/02/2016  . Alcohol abuse   . Bronchitis due  to tobacco use ([place])   . Cancer ([place])    Lung  . COPD (chronic obstructive pulmonary disease) ([place])    on home o2 2LNC   . Dehydration 06/04/2016  . Encounter for antineoplastic chemotherapy 05/02/2016  . Gastritis   . Goals of care, counseling/discussion 05/02/2016  . Hematemesis   . HTN (hypertension) 10/30/2016  . Pulmonary embolism ([place]) 06/30/2017   on CTA chest  . Recurrent upper respiratory infection (URI)   . Seizures ([place]) 05/2011   new onset    Allergies No Known Allergies  IV Location/Drains/Wounds Patient Lines/Drains/Airways Status   Active Line/Drains/Airways    Name:   Placement date:   Placement time:   Site:    Days:   Implanted Port 05/13/16 Right Chest   05/13/16    -    Chest   448          Labs/Imaging Results for orders placed or performed during the hospital encounter of 08/04/17 (from the past 48 hour(s))  CBC with Differential     Status: Abnormal   Collection Time: 08/04/17  4:19 PM  Result Value Ref Range   WBC 6.4 4.0 - 10.5 K/uL   RBC 2.22 (L) 4.22 - 5.81 MIL/uL   Hemoglobin 7.2 (L) 13.0 - 17.0 g/dL   HCT 22.8 (L) 39.0 - 52.0 %   MCV 102.7 (H) 78.0 - 100.0 fL   MCH 32.4 26.0 - 34.0 pg   MCHC 31.6 30.0 - 36.0 g/dL   RDW 17.7 (H) 11.5 - 15.5 %   Platelets 113 (L) 150 - 400 K/uL    Comment: REPEATED TO VERIFY SPECIMEN CHECKED FOR CLOTS PLATELET COUNT CONFIRMED BY SMEAR    Neutrophils Relative % 77 %   Neutro Abs 5.0 1.7 - 7.7 K/uL   Lymphocytes Relative 13 %   Lymphs Abs 0.8 0.7 - 4.0 K/uL   Monocytes Relative 9 %   Monocytes Absolute 0.6 0.1 - 1.0 K/uL   Eosinophils Relative 1 %   Eosinophils Absolute 0.0 0.0 - 0.7 K/uL   Basophils Relative 0 %   Basophils Absolute 0.0 0.0 - 0.1 K/uL    Comment: Performed at [facility], [place] [address], [place], [place] [address]  Basic metabolic panel     Status: Abnormal   Collection Time: 08/04/17  4:19 PM  Result Value Ref Range   Sodium 143 135 - 145 mmol/L   Potassium 3.8 3.5 - 5.1 mmol/L   Chloride 99 98 - 111 mmol/L    Comment: Please note change in reference range.   CO2 35 (H) 22 - 32 mmol/L   Glucose, Bld 81 70 - 99 mg/dL    Comment: Please note change in reference range.   [name] 16 6 - 20 mg/dL    Comment: Please note change in reference range.   Creatinine, Ser 1.55 (H) 0.61 - 1.24 mg/dL   Calcium 9.2 8.9 - 10.3 mg/dL   GFR calc non Af Amer 48 (L) >60 mL/min   GFR calc Af Amer 55 (L) >60 mL/min    Comment: (NOTE) The eGFR has been calculated using the CKD EPI equation. This calculation has not been validated in all clinical situations. eGFR's persistently <60 mL/min signify possible Chronic  Kidney Disease.    Anion gap 9 5 - 15    Comment: Performed at [facility], [place] [address], [place], [place] [address]  Brain natriuretic peptide     Status: None   Collection Time:  08/04/17  4:21 PM  Result Value Ref Range   B Natriuretic Peptide 48.1 0.0 - 100.0 pg/mL    Comment: Performed at [facility], [place] [address] [place], [place] [address]  I-stat troponin, ED     Status: None   Collection Time: 08/04/17  4:27 PM  Result Value Ref Range   Troponin i, poc 0.01 0.00 - 0.08 ng/mL   Comment 3            Comment: Due to the release kinetics of cTnI, a negative result within the first hours of the onset of symptoms does not rule out myocardial infarction with certainty. If myocardial infarction is still suspected, repeat the test at appropriate intervals.    Dg Chest 2 View  Result Date: 08/04/2017 CLINICAL DATA:  Dyspnea.  Left lung cancer on chemotherapy. EXAM: CHEST - 2 VIEW COMPARISON:  08/01/2017 chest radiograph. FINDINGS: Right internal jugular MediPort terminates at the cavoatrial junction. Stable cardiomediastinal silhouette with top-normal heart size. No pneumothorax. Stable small right pleural effusion. Stable trace left pleural effusion. Hyperinflated lungs and emphysema. Stable small left upper parahilar nodular opacity. Mild diffuse prominence of central interstitial markings, slightly increased. Patchy right lung base opacity is unchanged. No acute consolidative airspace disease. IMPRESSION: 1. Mild diffuse prominence of the central interstitial markings, slightly increased, cannot exclude mild pulmonary edema. 2. Stable small left upper parahilar nodular opacity compatible with known neoplasm. 3. Stable small right and trace left pleural effusions. 4. Stable patchy right lung base opacity, favor atelectasis. 5. Hyperinflated lungs and emphysema, suggesting COPD. Electronically Signed   By: Ilona Sorrel M.D.   On: 08/04/2017 16:48     Pending Labs Unresulted Labs (From admission, onward)   Start     Ordered   08/05/17 0500  Comprehensive metabolic panel  Tomorrow morning,   R     08/04/17 2028   08/04/17 2027  CBC  Once,   R     08/04/17 2028   Signed and Held  Type and screen [place]  Once,   R    Comments:  [place]    Signed and Held   Signed and Held  Vitamin B12  Tomorrow morning,   R     Signed and Held   Signed and Held  Folate RBC  Tomorrow morning,   R     Signed and Held   Signed and Held  Ferritin  Tomorrow morning,   R     Signed and Held   Signed and Held  Iron and TIBC  Tomorrow morning,   R     Signed and Held      Vitals/Pain Today's Vitals   08/04/17 2027 08/04/17 2100 08/04/17 2124 08/04/17 2138  BP:  137/84 137/84   Pulse:  79 83   Resp:   (!) 21   Temp:      TempSrc:      SpO2:  100% 100% 99%  Weight:      Height:      PainSc: 9        Isolation Precautions No active isolations  Medications Medications  ipratropium-albuterol (DUONEB) 0.5-2.5 (3) MG/3ML nebulizer solution 3 mL (has no administration in time range)  albuterol (PROVENTIL,VENTOLIN) solution continuous neb (10 mg/hr Nebulization New Bag/Given 08/04/17 2133)  sodium chloride flush (NS) 0.9 % injection 3 mL (has no administration in time range)  sodium chloride flush (NS) 0.9 % injection 3 mL (has no administration  in time range)  0.9 %  sodium chloride infusion (has no administration in time range)  acetaminophen (TYLENOL) tablet 650 mg (has no administration in time range)    Or  acetaminophen (TYLENOL) suppository 650 mg (has no administration in time range)  methylPREDNISolone sodium succinate (SOLU-MEDROL) 125 mg/2 mL injection 80 mg (has no administration in time range)  azithromycin (ZITHROMAX) 500 mg in sodium chloride 0.9 % 250 mL IVPB (500 mg Intravenous Transfusing/Transfer 08/04/17 2154)  albuterol (PROVENTIL) (2.5 MG/3ML) 0.083% nebulizer solution 2.5 mg  (2.5 mg Nebulization Not Given 08/04/17 2133)  methylPREDNISolone sodium succinate (SOLU-MEDROL) 125 mg/2 mL injection 125 mg (125 mg Intravenous Given 08/04/17 1910)  furosemide (LASIX) injection 40 mg (40 mg Intravenous Given 08/04/17 1910)  ipratropium (ATROVENT) nebulizer solution 0.5 mg (0.5 mg Nebulization Given 08/04/17 2133)  magnesium sulfate IVPB 2 g 50 mL (0 g Intravenous Stopped 08/04/17 2124)    Mobility walks with person assist

## 2017-08-04 NOTE — ED Triage Notes (Signed)
Transported by [facility] from [facility], bilateral lower extremity edema and generalized pain. VSS with EMS, patient is oxygen dependent. Recently seen here at this facility for same.

## 2017-08-04 NOTE — ED Notes (Signed)
Upon standing, pt's HR spiked to 128 and O2 dropped to 67. Pt stated he was very dizzy and audible wheezing was observed by EMT. Upon resting, pt was still very dizzy for appx 2-3 min. PA has been notified.

## 2017-08-04 NOTE — ED Notes (Signed)
[name] pulled off continuous hour long neb states he didn't want it.

## 2017-08-04 NOTE — ED Notes (Signed)
Resp contacted r/t continuous neb will be down to see pt

## 2017-08-04 NOTE — ED Provider Notes (Signed)
[facility] DEPT Provider Note   CSN: [id number] Arrival date & time: 08/04/17  1357     History   Chief Complaint Chief Complaint  Patient presents with  . Shortness of Breath    HPI [name] is a 57 y.o. male with a history of stage IV lung cancer currently undergoing chemotherapy (last session reported as 1 month ago) followed by Dr. Earlie Server, COPD on home O2, seizures, anemia, diastolic CHF, hypertension, prior PE currently on Eliquis (recent CT scan on 7/9 show near complete dissolution of previous PE), CKD who presents from nursing home to the emergency department today for shortness of breath.  Patient reports she has chronic shortness of breath typically occurs when he goes from a sitting to a standing position.  He notes associated chest tightness with this.  He reports that while at the nursing home today it felt like it increased.  He is unable to describe how it increased.  He reports he has no shortness of breath at rest and denies any orthopnea or paroxysmal nocturnal dyspnea.  He reports that he has been compliant with his home medications.  He reports that he does have associated cough that is dry, chronic without sputum production or hemoptysis.  He has he has bilateral lower extremity swelling without any pain or increased swelling on one side.  He denies any fever, chills, chest pain, no abdominal pain, urinary symptoms, nausea/vomiting/diarrhea. No syncope or falls reported.   HPI  Past Medical History:  Diagnosis Date  . Abdominal pain 06/04/2016  . Adenocarcinoma of left lung, stage 4 ([place]) 05/02/2016  . Alcohol abuse   . Bronchitis due to tobacco use ([place])   . Cancer ([place])    Lung  . COPD (chronic obstructive pulmonary disease) ([place])    on home o2 2LNC   . Dehydration 06/04/2016  . Encounter for antineoplastic chemotherapy 05/02/2016  . Gastritis   . Goals of care, counseling/discussion 05/02/2016  . Hematemesis   . HTN  (hypertension) 10/30/2016  . Recurrent upper respiratory infection (URI)   . Seizures ([place]) 05/2011   new onset  . Shortness of breath     Patient Active Problem List   Diagnosis Date Noted  . Protein-calorie malnutrition, severe 07/31/2017  . Thrombocytopenia ([place]) 07/29/2017  . Wheezing on exhalation 07/29/2017  . Anemia 07/24/2017  . Normocytic anemia 07/24/2017  . Chronic diastolic (congestive) heart failure ([place]) 07/24/2017  . Recurrent pleural effusion on right 07/15/2017  . Acute on chronic respiratory failure ([place]) 07/11/2017  . History of pulmonary embolism 07/11/2017  . PE (pulmonary thromboembolism) ([place]) 06/30/2017  . Pulmonary embolism ([place]) 06/30/2017  . AKI (acute kidney injury) ([place]) 06/25/2017  . Dyspnea 06/21/2017  . Chronic combined systolic (congestive) and diastolic (congestive) heart failure ([place])   . Constipation 06/07/2017  . Pain   . Acute on chronic respiratory failure with hypoxia ([place]) 05/28/2017  . Chronic respiratory failure with hypoxia ([place]) 04/01/2017  . Leucocytosis 03/16/2017  . CKD (chronic kidney disease), stage III ([place]) 03/12/2017  . Malnutrition of moderate degree 03/10/2017  . COPD exacerbation ([place]) 03/09/2017  . Macrocytic anemia 02/07/2017  . Severe malnutrition ([place]) 01/03/2017  . DOE (dyspnea on exertion) 12/30/2016  . Nausea   . Hypervolemia   . HCAP (healthcare-associated pneumonia) 12/19/2016  . SOB (shortness of breath) 12/19/2016  . Erosive gastropathy 12/18/2016  . Gastritis 12/18/2016  . Hematemesis 12/16/2016  . Intractable nausea and vomiting 12/16/2016  . HTN (hypertension) 10/30/2016  .  Chronic obstructive pulmonary disease ([place]) 09/02/2016  . COPD (chronic obstructive pulmonary disease) ([place]) 08/19/2016  . Dehydration 06/04/2016  . Abdominal pain 06/04/2016  . 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 Adenocarcinoma of left lung, stage 4 ([place]) 05/02/2016  . Goals of care, counseling/discussion 05/02/2016  .  Encounter for antineoplastic chemotherapy 05/02/2016  . Tobacco abuse 05/30/2011  . Alcohol abuse 05/30/2011  . Seizure ([place]) 05/28/2011    Past Surgical History:  Procedure Laterality Date  . ESOPHAGOGASTRODUODENOSCOPY (EGD) WITH PROPOFOL N/A 12/17/2016   Procedure: ESOPHAGOGASTRODUODENOSCOPY (EGD) WITH PROPOFOL;  Surgeon: Ladene Artist, MD;  Location: [facility] ENDOSCOPY;  Service: Endoscopy;  Laterality: N/A;  . IR FLUORO GUIDE PORT INSERTION RIGHT  05/13/2016  . IR US GUIDE VASC ACCESS RIGHT  05/13/2016  . NO PAST SURGERIES          Home Medications    Prior to Admission medications   Medication Sig Start Date End Date Taking? Authorizing Provider  acetaminophen (TYLENOL) 500 MG tablet Take 1 tablet (500 mg total) by mouth 2 (two) times daily as needed for mild pain. 07/07/17   Florencia Reasons, MD  albuterol (PROVENTIL) (2.5 MG/3ML) 0.083% nebulizer solution 1 neb every 4-6 hours as needed for wheezing and shortness of breath Patient taking differently: Take 2.5 mg by nebulization every 6 (six) hours as needed for wheezing or shortness of breath.  04/01/17   Parrett, Fonnie Mu, NP  apixaban (ELIQUIS) 5 MG TABS tablet Take 5 mg by mouth 2 (two) times daily.    [provider]  Chlorphen-Phenyleph-ASA (ALKA-SELTZER PLUS COLD PO) Take 2 tablets by mouth at bedtime as needed (COUGH).    [provider]  dexamethasone (DECADRON) 4 MG tablet Take 4 mg by mouth 2 (two) times daily. The day before, the day of, and the day after chemotherapy every three weeks    [provider]  Dextromethorphan-guaiFENesin (TUSSIN DM) 10-100 MG/5ML liquid Take 5 mLs by mouth every 12 (twelve) hours.    [provider]  escitalopram (LEXAPRO) 5 MG tablet Take 1 tablet (5 mg total) by mouth at bedtime. 04/17/17   Rosita Fire, MD  feeding supplement, ENSURE ENLIVE, (ENSURE ENLIVE) LIQD Take 237 mLs by mouth 3 (three) times daily between meals. 03/19/17   Shelly Coss, MD  ferrous  sulfate 325 (65 FE) MG tablet Take 1 tablet (325 mg total) by mouth 2 (two) times daily with a meal. 07/12/17   Lavina Hamman, MD  Fluticasone-Umeclidin-Vilant (TRELEGY ELLIPTA) 100-62.5-25 MCG/INH AEPB Inhale 1 puff into the lungs daily. 05/31/17   Aline August, MD  folic acid (FOLVITE) 1 MG tablet Take 1 tablet (1 mg total) by mouth daily. 07/13/17   Lavina Hamman, MD  furosemide (LASIX) 40 MG tablet Take 1 tablet (40 mg total) by mouth daily. 07/13/17   Lavina Hamman, MD  gabapentin (NEURONTIN) 100 MG capsule Take 1 capsule (100 mg total) by mouth 3 (three) times daily. 07/12/17   Lavina Hamman, MD  guaiFENesin (MUCINEX) 600 MG 12 hr tablet Take 1,200 mg by mouth 2 (two) times daily.     [provider]  hydrOXYzine (ATARAX/VISTARIL) 10 MG tablet Take 1 tablet (10 mg total) by mouth 3 (three) times daily as needed for anxiety. 07/25/17   Donne Hazel, MD  ipratropium-albuterol (DUONEB) 0.5-2.5 (3) MG/3ML SOLN Take 3 mLs by nebulization 4 (four) times daily. 07/28/17   Lavina Hamman, MD  loratadine (CLARITIN) 10 MG tablet Take 10 mg by mouth  daily.    [provider]  metoprolol tartrate (LOPRESSOR) 25 MG tablet Take 12.5 mg by mouth 2 (two) times daily.     [provider]  Multiple Vitamin (THERA-TABS PO) Take 1 tablet by mouth daily.    [provider]  ondansetron (ZOFRAN-ODT) 4 MG disintegrating tablet Take 4 mg by mouth every 8 (eight) hours as needed for nausea or vomiting.    [provider]  Oxycodone HCl 10 MG TABS Take 1 tablet (10 mg total) by mouth every 4 (four) hours as needed for up to 15 doses (pain/shortness of breath). 07/13/17   McDonald, Mia A, PA-C  oxyCODONE-acetaminophen (PERCOCET) 5-325 MG tablet Take 1 tablet by mouth every 4 (four) hours as needed for severe pain. 08/01/17   Daleen Bo, MD  pantoprazole (PROTONIX) 40 MG tablet Take 1 tablet (40 mg total) by mouth 2 (two) times daily. 12/18/16   Eugenie Filler, MD    polyethylene glycol [facility] / Floria Raveling) packet Take 17 g by mouth daily. 07/29/17   Lavina Hamman, MD  potassium chloride (K-DUR) 10 MEQ tablet Take 2 tablets (20 mEq total) by mouth daily. 07/28/17   Lavina Hamman, MD  senna (SENOKOT) 8.6 MG TABS tablet Take 1 tablet by mouth 2 (two) times daily.    [provider]  senna-docusate (SENOKOT-S) 8.6-50 MG tablet Take 2 tablets by mouth 2 (two) times daily. Patient taking differently: Take 1 tablet by mouth 2 (two) times daily.  07/12/17   Lavina Hamman, MD  senna-docusate (SENOKOT-S) 8.6-50 MG tablet Take 2 tablets by mouth 2 (two) times daily.    [provider]  sucralfate (CARAFATE) 1 GM/10ML suspension Take 10 mLs (1 g total) by mouth 4 (four) times daily -  with meals and at bedtime. 07/12/17   Lavina Hamman, MD  vitamin B-12 (CYANOCOBALAMIN) 1000 MCG tablet Take 1 tablet (1,000 mcg total) by mouth daily. 07/28/17   Lavina Hamman, MD    Family History Family History  Problem Relation Age of Onset  . Cancer Father   . Diabetes Mellitus II Mother     Social History Social History   Tobacco Use  . Smoking status: Current Some Day Smoker    Packs/day: 0.10    Years: 30.00    Pack years: 3.00    Types: Cigarettes  . Smokeless tobacco: Never Used  . Tobacco comment: 1 cig a week  Substance Use Topics  . Alcohol use: No    Frequency: Never  . Drug use: No     Allergies   Patient has no known allergies.   Review of Systems Review of Systems  All other systems reviewed and are negative.    Physical Exam Updated Vital Signs BP (!) 144/80 (BP Location: Right Arm)   Pulse 77   Temp 98.4 F (36.9 C) (Oral)   Resp 16   Ht 5\' 11"  (1.803 m)   Wt 54.4 kg (120 lb)   SpO2 100%   BMI 16.74 kg/m   Physical Exam  Constitutional: He appears well-developed and well-nourished.  HENT:  Head: Normocephalic and atraumatic.  Right Ear: External ear normal.  Left Ear: External ear normal.  Nose: Nose  normal.  Mouth/Throat: Uvula is midline, oropharynx is clear and moist and mucous membranes are normal. No tonsillar exudate.  Eyes: Pupils are equal, round, and reactive to light. Right eye exhibits no discharge. Left eye exhibits no discharge. No scleral icterus.  Neck: Trachea normal. Neck  supple. No spinous process tenderness present. No neck rigidity. Normal range of motion present.  Cardiovascular: Normal rate, regular rhythm and intact distal pulses.  No murmur heard. Pulses:      Radial pulses are 2+ on the right side, and 2+ on the left side.       Dorsalis pedis pulses are 2+ on the right side, and 2+ on the left side.       Posterior tibial pulses are 2+ on the right side, and 2+ on the left side.  2+ lower extremity swelling and edema. Calves symmetric in size bilaterally. Negative Homan's test b/l.   Pulmonary/Chest: Effort normal. He has decreased breath sounds in the right upper field, the right middle field and the right lower field. He has wheezes in the right upper field and the left upper field. He exhibits no tenderness.  Patient sating at 100% on 3L of O2. No increased work of breathing. No accessory muscle use. Patient is sitting upright, speaking in full sentences without difficulty.   Abdominal: Soft. Bowel sounds are normal. There is no tenderness. There is no rigidity, no rebound, no guarding and no CVA tenderness.  Musculoskeletal: He exhibits no edema.  Lymphadenopathy:    He has no cervical adenopathy.  Neurological: He is alert. No sensory deficit.  Skin: Skin is warm and dry. No rash noted. He is not diaphoretic.  Psychiatric: He has a normal mood and affect.  Nursing note and vitals reviewed.    ED Treatments / Results  Labs (all labs ordered are listed, but only abnormal results are displayed) Labs Reviewed  CBC WITH DIFFERENTIAL/PLATELET - Abnormal; Notable for the following components:      Result Value   RBC 2.22 (*)    Hemoglobin 7.2 (*)    HCT  22.8 (*)    MCV 102.7 (*)    RDW 17.7 (*)    Platelets 113 (*)    All other components within normal limits  BASIC METABOLIC PANEL - Abnormal; Notable for the following components:   CO2 35 (*)    Creatinine, Ser 1.55 (*)    GFR calc non Af Amer 48 (*)    GFR calc Af Amer 55 (*)    All other components within normal limits  CBC - Abnormal; Notable for the following components:   RBC 2.40 (*)    Hemoglobin 8.0 (*)    HCT 24.3 (*)    MCV 101.3 (*)    RDW 17.5 (*)    Platelets 119 (*)    All other components within normal limits  BRAIN NATRIURETIC PEPTIDE  COMPREHENSIVE METABOLIC PANEL  I-STAT TROPONIN, ED    EKG None  Radiology Dg Chest 2 View  Result Date: 08/04/2017 CLINICAL DATA:  Dyspnea.  Left lung cancer on chemotherapy. EXAM: CHEST - 2 VIEW COMPARISON:  08/01/2017 chest radiograph. FINDINGS: Right internal jugular MediPort terminates at the cavoatrial junction. Stable cardiomediastinal silhouette with top-normal heart size. No pneumothorax. Stable small right pleural effusion. Stable trace left pleural effusion. Hyperinflated lungs and emphysema. Stable small left upper parahilar nodular opacity. Mild diffuse prominence of central interstitial markings, slightly increased. Patchy right lung base opacity is unchanged. No acute consolidative airspace disease. IMPRESSION: 1. Mild diffuse prominence of the central interstitial markings, slightly increased, cannot exclude mild pulmonary edema. 2. Stable small left upper parahilar nodular opacity compatible with known neoplasm. 3. Stable small right and trace left pleural effusions. 4. Stable patchy right lung base opacity, favor atelectasis. 5. Hyperinflated lungs and  emphysema, suggesting COPD. Electronically Signed   By: Ilona Sorrel M.D.   On: 08/04/2017 16:48    Procedures Procedures (including critical care time) CRITICAL CARE Performed by: Jillyn Ledger   Total critical care time: 45 minutes - multiple breathing  treatments and hypoxia.   Critical care time was exclusive of separately billable procedures and treating other patients.  Critical care was necessary to treat or prevent imminent or life-threatening deterioration.  Critical care was time spent personally by me on the following activities: development of treatment plan with patient and/or surrogate as well as nursing, discussions with consultants, evaluation of patient's response to treatment, examination of patient, obtaining history from patient or surrogate, ordering and performing treatments and interventions, ordering and review of laboratory studies, ordering and review of radiographic studies, pulse oximetry and re-evaluation of patient's condition.   Medications Ordered in ED Medications  ipratropium-albuterol (DUONEB) 0.5-2.5 (3) MG/3ML nebulizer solution 3 mL (has no administration in time range)  albuterol (PROVENTIL,VENTOLIN) solution continuous neb (0 mg/hr Nebulization Stopped 08/04/17 2233)  albuterol (PROVENTIL) (2.5 MG/3ML) 0.083% nebulizer solution 2.5 mg (has no administration in time range)  apixaban (ELIQUIS) tablet 5 mg (has no administration in time range)  escitalopram (LEXAPRO) tablet 5 mg (has no administration in time range)  feeding supplement (ENSURE ENLIVE) (ENSURE ENLIVE) liquid 237 mL (has no administration in time range)  ferrous sulfate tablet 325 mg (has no administration in time range)  folic acid (FOLVITE) tablet 1 mg (has no administration in time range)  furosemide (LASIX) tablet 40 mg (has no administration in time range)  gabapentin (NEURONTIN) capsule 100 mg (has no administration in time range)  guaiFENesin (MUCINEX) 12 hr tablet 1,200 mg (has no administration in time range)  hydrOXYzine (ATARAX/VISTARIL) tablet 10 mg (has no administration in time range)  loratadine (CLARITIN) tablet 10 mg (has no administration in time range)  metoprolol tartrate (LOPRESSOR) tablet 12.5 mg (has no administration in  time range)  ondansetron (ZOFRAN-ODT) disintegrating tablet 4 mg (has no administration in time range)  oxyCODONE-acetaminophen (PERCOCET/ROXICET) 5-325 MG per tablet 1 tablet (has no administration in time range)  pantoprazole (PROTONIX) EC tablet 40 mg (has no administration in time range)  polyethylene glycol (MIRALAX / GLYCOLAX) packet 17 g (has no administration in time range)  potassium chloride (K-DUR) CR tablet 20 mEq (has no administration in time range)  senna-docusate (Senokot-S) tablet 1 tablet (has no administration in time range)  sodium chloride flush (NS) 0.9 % injection 3 mL (3 mLs Intravenous Given 08/04/17 2200)  sodium chloride flush (NS) 0.9 % injection 3 mL (has no administration in time range)  0.9 %  sodium chloride infusion (has no administration in time range)  acetaminophen (TYLENOL) tablet 650 mg (has no administration in time range)    Or  acetaminophen (TYLENOL) suppository 650 mg (has no administration in time range)  methylPREDNISolone sodium succinate (SOLU-MEDROL) 125 mg/2 mL injection 80 mg (has no administration in time range)  azithromycin (ZITHROMAX) 500 mg in sodium chloride 0.9 % 250 mL IVPB (500 mg Intravenous Transfusing/Transfer 08/04/17 2154)  albuterol (PROVENTIL) (2.5 MG/3ML) 0.083% nebulizer solution 2.5 mg (2.5 mg Nebulization Not Given 08/04/17 2133)  sodium chloride flush (NS) 0.9 % injection 10-40 mL (10 mLs Intracatheter Given 08/04/17 2258)  multivitamin with minerals tablet 1 tablet (has no administration in time range)  fluticasone furoate-vilanterol (BREO ELLIPTA) 100-25 MCG/INH 1 puff (has no administration in time range)  umeclidinium bromide (INCRUSE ELLIPTA) 62.5 MCG/INH 1 puff (has no administration  in time range)  methylPREDNISolone sodium succinate (SOLU-MEDROL) 125 mg/2 mL injection 125 mg (125 mg Intravenous Given 08/04/17 1910)  furosemide (LASIX) injection 40 mg (40 mg Intravenous Given 08/04/17 1910)  ipratropium (ATROVENT) nebulizer  solution 0.5 mg (0.5 mg Nebulization Given 08/04/17 2133)  magnesium sulfate IVPB 2 g 50 mL (0 g Intravenous Stopped 08/04/17 2124)     Initial Impression / Assessment and Plan / ED Course  I have reviewed the triage vital signs and the nursing notes.  Pertinent labs & imaging results that were available during my care of the patient were reviewed by me and considered in my medical decision making (see chart for details).     57 y.o. male with a history of stage IV lung cancer currently undergoing chemotherapy (last session reported as 1 month ago) followed by Dr. Earlie Server, COPD on home O2, seizures, anemia, diastolic CHF, hypertension, prior PE currently on Eliquis (recent CT scan on 7/9 show near complete dissolution of previous PE), CKD who presents from nursing home to the emergency department today for worsening shortness of breath from baseline with minimal exertion over the last several days.  He notes associated dry cough as well as chest tightness but denies any chest pain.  He also reports lower extremity swelling bilaterally.  On arrival patient is on 3 L O2 satting at 100%.  He is without fever, tachycardia, tachypnea, hypotension or hypoxia.  Patient with decreased breath sounds on the right side.  He is wheezing throughout.  He does have 2+ lower extremity edema.  Will work-up for COPD versus CHF exacerbation.  Will give DuoNeb therapy.  Patient's chest x-ray with mild diffuse prominence of the central interstitial markings that could be consistent with pulmonary edema.  There is stable changes otherwise noted.  There is also hyperinflated lungs consistent with COPD.  BNP is not elevated.  Patient is without leukocytosis.  He has baseline anemia.  Troponin within normal limits.  EKG without STEMI.  Patient was attempted to ambulate but desaturated with hypoxia in the 70s.  This resolved after rest.  Patient was given hour-long breathing treatment, magnesium following this.  He required  admission for COPD exacerbation.  Given patient also has some lower extremity edema and mild pulmonary edema on chest x-ray will give IV Lasix.  I appreciate Dr. Maudie Mercury for admitting patient to hospitalist service.  He appears stable for admission.  Final Clinical Impressions(s) / ED Diagnoses   Final diagnoses:  COPD exacerbation ([place])  Shortness of breath  Cough  History of CHF (congestive heart failure)  History of lung cancer  History of pulmonary embolism    ED Discharge Orders    None       Lorelle Gibbs 08/04/17 2358    Davonna Belling, MD 08/05/17 2147

## 2017-08-04 NOTE — Telephone Encounter (Signed)
Okay for palliative care?

## 2017-08-04 NOTE — ED Notes (Signed)
Pt requests for blood to be drawn off of port access

## 2017-08-05 ENCOUNTER — Inpatient Hospital Stay: Payer: Medicaid Other | Attending: Internal Medicine

## 2017-08-05 ENCOUNTER — Inpatient Hospital Stay: Payer: Medicaid Other | Admitting: Internal Medicine

## 2017-08-05 ENCOUNTER — Other Ambulatory Visit: Payer: Self-pay

## 2017-08-05 ENCOUNTER — Inpatient Hospital Stay: Payer: Medicaid Other

## 2017-08-05 DIAGNOSIS — Z8679 Personal history of other diseases of the circulatory system: Secondary | ICD-10-CM

## 2017-08-05 DIAGNOSIS — Z86711 Personal history of pulmonary embolism: Secondary | ICD-10-CM

## 2017-08-05 DIAGNOSIS — Z85118 Personal history of other malignant neoplasm of bronchus and lung: Secondary | ICD-10-CM

## 2017-08-05 DIAGNOSIS — R05 Cough: Secondary | ICD-10-CM

## 2017-08-05 DIAGNOSIS — C3492 Malignant neoplasm of unspecified part of left bronchus or lung: Secondary | ICD-10-CM

## 2017-08-05 DIAGNOSIS — J9621 Acute and chronic respiratory failure with hypoxia: Secondary | ICD-10-CM

## 2017-08-05 LAB — IRON AND TIBC
IRON: 19 ug/dL — AB (ref 45–182)
Saturation Ratios: 9 % — ABNORMAL LOW (ref 17.9–39.5)
TIBC: 217 ug/dL — ABNORMAL LOW (ref 250–450)
UIBC: 198 ug/dL

## 2017-08-05 LAB — COMPREHENSIVE METABOLIC PANEL
ALBUMIN: 3 g/dL — AB (ref 3.5–5.0)
ALT: 15 U/L (ref 0–44)
AST: 23 U/L (ref 15–41)
Alkaline Phosphatase: 99 U/L (ref 38–126)
Anion gap: 9 (ref 5–15)
BUN: 20 mg/dL (ref 6–20)
CHLORIDE: 98 mmol/L (ref 98–111)
CO2: 33 mmol/L — ABNORMAL HIGH (ref 22–32)
Calcium: 8.7 mg/dL — ABNORMAL LOW (ref 8.9–10.3)
Creatinine, Ser: 1.57 mg/dL — ABNORMAL HIGH (ref 0.61–1.24)
GFR calc Af Amer: 54 mL/min — ABNORMAL LOW (ref >60)
GFR calc non Af Amer: 47 mL/min — ABNORMAL LOW (ref >60)
GLUCOSE: 262 mg/dL — AB (ref 70–99)
POTASSIUM: 3.7 mmol/L (ref 3.5–5.1)
Sodium: 140 mmol/L (ref 135–145)
Total Bilirubin: 0.2 mg/dL — ABNORMAL LOW (ref 0.3–1.2)
Total Protein: 5.9 g/dL — ABNORMAL LOW (ref 6.5–8.1)

## 2017-08-05 LAB — FERRITIN: FERRITIN: 1098 ng/mL — AB (ref 24–336)

## 2017-08-05 LAB — TYPE AND SCREEN
ABO/RH(D): O POS
Antibody Screen: NEGATIVE

## 2017-08-05 LAB — VITAMIN B12: Vitamin B-12: 942 pg/mL — ABNORMAL HIGH (ref 180–914)

## 2017-08-05 MED ORDER — METHYLPREDNISOLONE SODIUM SUCC 40 MG IJ SOLR
40.0000 mg | Freq: Three times a day (TID) | INTRAMUSCULAR | Status: DC
  Administered 2017-08-05 – 2017-08-06 (×4): 40 mg via INTRAVENOUS
  Filled 2017-08-05 (×4): qty 1

## 2017-08-05 MED ORDER — HYDROCOD POLST-CPM POLST ER 10-8 MG/5ML PO SUER
5.0000 mL | Freq: Two times a day (BID) | ORAL | Status: DC
  Administered 2017-08-05 – 2017-08-09 (×8): 5 mL via ORAL
  Filled 2017-08-05 (×8): qty 5

## 2017-08-05 MED ORDER — ALBUTEROL SULFATE (2.5 MG/3ML) 0.083% IN NEBU
2.5000 mg | INHALATION_SOLUTION | Freq: Three times a day (TID) | RESPIRATORY_TRACT | Status: DC
  Administered 2017-08-05 – 2017-08-06 (×4): 2.5 mg via RESPIRATORY_TRACT
  Filled 2017-08-05 (×4): qty 3

## 2017-08-05 MED ORDER — DM-GUAIFENESIN ER 30-600 MG PO TB12
1.0000 | ORAL_TABLET | Freq: Two times a day (BID) | ORAL | Status: DC
  Administered 2017-08-05 – 2017-08-08 (×7): 1 via ORAL
  Filled 2017-08-05 (×8): qty 1

## 2017-08-05 NOTE — Progress Notes (Signed)
Pt refused 2200 medications including Eliquis, Protonix, mucinex, Metoprolol, gabapentin, Senokot, and escitalopram. Pt educated on the importance of medication but Pt still refused stating, "I am not taking that many medications I just took some the last nurse gave me before you got here. No need to take the medication if it is not working.". Pt advised to at least take Eliquis and metoprolol but pt refused. NP on call notified. Will continue to monitor closely.

## 2017-08-05 NOTE — Progress Notes (Signed)
PROGRESS NOTE    IRBIN FINES   NTI:[id number]  DOB: Feb 28, 1960  DOA: 08/04/2017 PCP: [name], MD   Brief Narrative:  [name] is a 57 y.o. male, w hypertension, seizure do, stage 4 lung cancer on chemo, pulmonary embolism, Copd on home o2, apparently c/o dyspnea for the past 3 days and dry cough.   Subjective: He has a severe cough and pain in his chest when he coughs. Sputum is clear. He has dyspnea on exertion. He uses 2 L O2 at home occasionally.     Assessment & Plan:   Principal Problem:   COPD exacerbation     Acute on Chronic respiratory failure with hypoxia   - CXR reveal RLL opacity and effusion-  Atelectasis (also seen on CT last week) - cont Solumedrol (wean from 80 mg Q 8 to 40 mg Q8), Nebs and Azithromycin - cont Breo Ellipta and Incruse Ellipta (uses Trelegy Ellipta as outpt) - add Tussionex and Mucinex DM - cont O2 as needed - ambulatory pulse ox tomorrow AM   Active Problems:   Adenocarcinoma of left lung, stage 4  - spiculated nodule seen on imaging - follows with Dr [name], - he will miss his chemo today and I have notified Dr [name]  Chronic diastolic CHF (grade 1- 2 per past 3 ECHOs) - on Lasix daily- he is not fluid overloaded     HTN (hypertension) - Lopressor    Pulmonary embolism  - Eliquis     Anemia - abnormally elevated Ferratin due to cancer - low Iron levels  Chronic pain - cont Oxycodone PRN  DVT prophylaxis: Eliquis Code Status: full code Family Communication:  Disposition Plan: f/u on COPD exacerbation and cough Consultants:   none Procedures:   none Antimicrobials:  Anti-infectives (From admission, onward)   Start     Dose/Rate Route Frequency Ordered Stop   08/04/17 2100  azithromycin (ZITHROMAX) 500 mg in sodium chloride 0.9 % 250 mL IVPB     500 mg 250 mL/hr over 60 Minutes Intravenous Every 24 hours 08/04/17 2028         Objective: Vitals:   08/04/17 2223 08/05/17 0109 08/05/17 0700  08/05/17 0758  BP: 134/72  (!) 165/108   Pulse: 89  64   Resp: 20  20   Temp: 97.7 F (36.5 C)  97.8 F (36.6 C)   TempSrc: Oral  Oral   SpO2: 97% 96%  100%  Weight:      Height:        Intake/Output Summary (Last 24 hours) at 08/05/2017 1326 Last data filed at 08/05/2017 0314 Gross per 24 hour  Intake 270 ml  Output 1200 ml  Net -930 ml   Filed Weights   08/04/17 1427  Weight: 54.4 kg (120 lb)    Examination: General exam: Appears comfortable  HEENT: PERRLA, oral mucosa moist, no sclera icterus or thrush Respiratory system: Coarse crackles in RLL, mild rhonchi, no wheezing, severe cough Cardiovascular system: S1 & S2 heard, RRR.   Gastrointestinal system: Abdomen soft, non-tender, nondistended. Normal bowel sound. No organomegaly Central nervous system: Alert and oriented. No focal neurological deficits. Extremities: No cyanosis, clubbing or edema Skin: No rashes or ulcers Psychiatry:  Mood & affect appropriate.     Data Reviewed: I have personally reviewed following labs and imaging studies  CBC: Recent Labs  Lab 07/29/17 1807  07/31/17 0327 07/31/17 2100 08/01/17 1152 08/04/17 1619 08/04/17 2256  WBC 9.1   < > 10.5  11.4* 12.3* 6.4 9.2  NEUTROABS 8.6*  --   --  10.0*  --  5.0  --   HGB 8.1*   < > 7.6* 7.7* 8.0* 7.2* 8.0*  HCT 24.7*   < > 23.9* 24.3* 24.7* 22.8* 24.3*  MCV 100.4*   < > 103.0* 102.5* 100.8* 102.7* 101.3*  PLT 32*   < > 38* 45* 58* 113* 119*   < > = values in this interval not displayed.   Basic Metabolic Panel: Recent Labs  Lab 07/30/17 0630 07/31/17 2100 08/01/17 1152 08/04/17 1619 08/05/17 0312  NA 142 143 143 143 140  K 4.4 3.7 3.6 3.8 3.7  CL 100 98 100 99 98  CO2 34* 39* 33* 35* 33*  GLUCOSE 109* 97 161* 81 262*  BUN 25* 23* 25* 16 20  CREATININE 1.45* 1.50* 1.42* 1.55* 1.57*  CALCIUM 8.7* 9.0 9.1 9.2 8.7*  MG 2.0  --   --   --   --   PHOS 1.7*  --   --   --   --    GFR: Estimated Creatinine Clearance: 39.5 mL/min (A)  (by C-G formula based on SCr of 1.57 mg/dL (H)). Liver Function Tests: Recent Labs  Lab 07/29/17 1807 07/30/17 0630 07/31/17 2100 08/01/17 1152 08/05/17 0312  AST 22 18 24 24 23   ALT 16 14 20 21 15   ALKPHOS 100 83 89 102 99  BILITOT 0.2* 0.6 0.4 0.2* 0.2*  PROT 6.1* 5.3* 5.6* 6.2* 5.9*  ALBUMIN 3.1* 2.7* 2.9* 3.2* 3.0*   Recent Labs  Lab 08/01/17 1152  LIPASE 22   No results for input(s): AMMONIA in the last 168 hours. Coagulation Profile: Recent Labs  Lab 07/30/17 0630  INR 1.02   Cardiac Enzymes: Recent Labs  Lab 07/29/17 1807 07/30/17 0030 07/30/17 0630 07/30/17 1239 07/31/17 2100  TROPONINI <0.03 0.03* 0.03* 0.03* 0.03*   BNP (last 3 results) No results for input(s): PROBNP in the last 8760 hours. HbA1C: No results for input(s): HGBA1C in the last 72 hours. CBG: No results for input(s): GLUCAP in the last 168 hours. Lipid Profile: No results for input(s): CHOL, HDL, LDLCALC, TRIG, CHOLHDL, LDLDIRECT in the last 72 hours. Thyroid Function Tests: No results for input(s): TSH, T4TOTAL, FREET4, T3FREE, THYROIDAB in the last 72 hours. Anemia Panel: Recent Labs    08/05/17 0312  VITAMINB12 942*  FERRITIN 1,098*  TIBC 217*  IRON 19*   Urine analysis:    Component Value Date/Time   COLORURINE YELLOW 07/16/2017 [place] 07/16/2017 1614   LABSPEC 1.011 07/16/2017 1614   PHURINE 6.0 07/16/2017 1614   GLUCOSEU NEGATIVE 07/16/2017 1614   HGBUR NEGATIVE 07/16/2017 1614   BILIRUBINUR SMALL (A) 07/16/2017 1614   KETONESUR NEGATIVE 07/16/2017 1614   PROTEINUR NEGATIVE 07/16/2017 1614   UROBILINOGEN 0.2 05/29/2011 0122   NITRITE NEGATIVE 07/16/2017 1614   LEUKOCYTESUR NEGATIVE 07/16/2017 1614   Sepsis Labs: @LABRCNTIP (procalcitonin:4,lacticidven:4) )No results found for this or any previous visit (from the past 240 hour(s)).       Radiology Studies: Dg Chest 2 View  Result Date: 08/04/2017 CLINICAL DATA:  Dyspnea.  Left lung  cancer on chemotherapy. EXAM: CHEST - 2 VIEW COMPARISON:  08/01/2017 chest radiograph. FINDINGS: Right internal jugular MediPort terminates at the cavoatrial junction. Stable cardiomediastinal silhouette with top-normal heart size. No pneumothorax. Stable small right pleural effusion. Stable trace left pleural effusion. Hyperinflated lungs and emphysema. Stable small left upper parahilar nodular opacity. Mild diffuse prominence of central  interstitial markings, slightly increased. Patchy right lung base opacity is unchanged. No acute consolidative airspace disease. IMPRESSION: 1. Mild diffuse prominence of the central interstitial markings, slightly increased, cannot exclude mild pulmonary edema. 2. Stable small left upper parahilar nodular opacity compatible with known neoplasm. 3. Stable small right and trace left pleural effusions. 4. Stable patchy right lung base opacity, favor atelectasis. 5. Hyperinflated lungs and emphysema, suggesting COPD. Electronically Signed   By: Ilona Sorrel M.D.   On: 08/04/2017 16:48      Scheduled Meds: . albuterol  2.5 mg Nebulization TID  . apixaban  5 mg Oral BID  . chlorpheniramine-HYDROcodone  5 mL Oral Q12H  . dextromethorphan-guaiFENesin  1 tablet Oral BID  . escitalopram  5 mg Oral QHS  . feeding supplement (ENSURE ENLIVE)  237 mL Oral TID BM  . ferrous sulfate  325 mg Oral BID WC  . fluticasone furoate-vilanterol  1 puff Inhalation Daily  . folic acid  1 mg Oral Daily  . furosemide  40 mg Oral Daily  . gabapentin  100 mg Oral TID  . loratadine  10 mg Oral Daily  . methylPREDNISolone (SOLU-MEDROL) injection  40 mg Intravenous Q8H  . metoprolol tartrate  12.5 mg Oral BID  . multivitamin with minerals  1 tablet Oral Daily  . pantoprazole  40 mg Oral BID  . polyethylene glycol  17 g Oral Daily  . potassium chloride  20 mEq Oral Daily  . senna-docusate  1 tablet Oral BID  . sodium chloride flush  3 mL Intravenous Q12H  . umeclidinium bromide  1 puff  Inhalation Daily   Continuous Infusions: . sodium chloride    . azithromycin Stopped (08/04/17 2241)     LOS: 1 day    Time spent in minutes: 35    Debbe Odea, MD [facility] Pager: [email or web address] Password [email or web address] 08/05/2017, 1:26 PM

## 2017-08-05 NOTE — Progress Notes (Signed)
Inpatient Diabetes Program Recommendations  AACE/ADA: New Consensus Statement on Inpatient Glycemic Control (2015)  Target Ranges:  Prepandial:   less than 140 mg/dL      Peak postprandial:   less than 180 mg/dL (1-2 hours)      Critically ill patients:  140 - 180 mg/dL   Lab Results  Component Value Date   GLUCAP 177 (H) 05/31/2017   HGBA1C 6.2 (H) 12/31/2016   Review of Glycemic Control  Diabetes history: None Current orders for Inpatient glycemic control: None  Inpatient Diabetes Program Recommendations:    Patient receiving IV Solumedrol 40 mg Q8 hours. GLucose this am 262 mg/dl in labs. Consider CBGs and Novolog 0-15 units tid if elevated.  Thanks,  Tama Headings RN, MSN, BC-ADM, [facility] Inpatient Diabetes Coordinator Team Pager [phone] (8a-5p)

## 2017-08-05 NOTE — Telephone Encounter (Signed)
Stacey notified.  

## 2017-08-05 NOTE — Progress Notes (Signed)
Providing support with Coralyn Mark alongside respiratory therapist.    [name] was anxious around inability to breathe.  Provided calming presence and engaged in conversation.  [name] noted that he does not like to rely on others for help - spoke of caring for his mother, who has diabetes, sister, who is blind, and son, who has 5 children and lives in [place].   [name] engaged in life review and spoke with chaplain about values.

## 2017-08-06 ENCOUNTER — Inpatient Hospital Stay (HOSPITAL_COMMUNITY): Payer: Medicaid Other

## 2017-08-06 DIAGNOSIS — J441 Chronic obstructive pulmonary disease with (acute) exacerbation: Principal | ICD-10-CM

## 2017-08-06 LAB — FOLATE RBC
FOLATE, RBC: 1861 ng/mL (ref >498)
Folate, Hemolysate: 396.4 ng/mL
Hematocrit: 21.3 % — ABNORMAL LOW (ref 37.5–51.0)

## 2017-08-06 MED ORDER — MORPHINE SULFATE 10 MG/5ML PO SOLN
2.5000 mg | ORAL | Status: DC | PRN
  Administered 2017-08-06 – 2017-08-08 (×3): 2.5 mg via ORAL
  Filled 2017-08-06 (×3): qty 5

## 2017-08-06 MED ORDER — PREDNISONE 50 MG PO TABS
50.0000 mg | ORAL_TABLET | Freq: Every day | ORAL | Status: DC
  Administered 2017-08-07 – 2017-08-08 (×2): 50 mg via ORAL
  Filled 2017-08-06 (×2): qty 1

## 2017-08-06 MED ORDER — CLONAZEPAM 0.5 MG PO TABS
0.2500 mg | ORAL_TABLET | Freq: Two times a day (BID) | ORAL | Status: DC | PRN
  Administered 2017-08-06 – 2017-08-07 (×2): 0.25 mg via ORAL
  Filled 2017-08-06 (×2): qty 1

## 2017-08-06 MED ORDER — ALBUTEROL SULFATE (2.5 MG/3ML) 0.083% IN NEBU
2.5000 mg | INHALATION_SOLUTION | RESPIRATORY_TRACT | Status: DC | PRN
  Administered 2017-08-06 – 2017-08-09 (×3): 2.5 mg via RESPIRATORY_TRACT
  Filled 2017-08-06 (×3): qty 3

## 2017-08-06 MED ORDER — ALBUTEROL SULFATE (2.5 MG/3ML) 0.083% IN NEBU
2.5000 mg | INHALATION_SOLUTION | Freq: Four times a day (QID) | RESPIRATORY_TRACT | Status: DC
  Administered 2017-08-06 – 2017-08-09 (×13): 2.5 mg via RESPIRATORY_TRACT
  Filled 2017-08-06 (×13): qty 3

## 2017-08-06 NOTE — Progress Notes (Signed)
Pt called RN to room stating he "can't breath". Pt received nebulizer treatment just 15 min prior to calling RN. Very anxious asking RN for respiratory to come and give another treatment. O2 at 2 l/min and oxygen saturations fluctuating between 88-94%. Respiratory and RN agree to call [facility] to assess. MD notified. Eulas Post, RN

## 2017-08-06 NOTE — Progress Notes (Signed)
Pt refused bed alarm. Pt educated to call when he needed to get up. Pt stated, "No ma'am! Don't put that alarm on my bed! I will call you if I need you!" Will monitor pt closely.

## 2017-08-06 NOTE — Care Management Note (Signed)
Case Management Note  Patient Details  Name: [name] MRN: [id number] Date of Birth: [birth year]  Subjective/Objective:  COPD                  Action/Plan: Plan to discharge back to ALF   Expected Discharge Date:  (unknown)               Expected Discharge Plan:  Assisted Living / Rest Home  In-House Referral:  Clinical Social Work  Discharge planning Services  CM Consult  Post Acute Care Choice:    Choice offered to:  Patient  DME Arranged:    DME Agency:  [place] Arranged:  PT [facility] Agency:     Status of Service:  In process, will continue to follow  If discussed at Long Length of Stay Meetings, dates discussed:    Additional CommentsPurcell Mouton, RN 08/06/2017, 11:18 AM

## 2017-08-06 NOTE — Progress Notes (Signed)
Patient has been extremely anxious the majority of the shift. SOB tends to happen when patient gets up at bedside to use the urinal. Has called nurse and respiratory therapist multiple times because he "can't breath". Has refused several suggestions/orders to help. MD spoke with patient and he is now receptive to anxiety meds. Has rested the latter part of the shift. Continue to try and keep O2 sats between 88-92% per order. Eulas Post, RN

## 2017-08-06 NOTE — Progress Notes (Signed)
[facility] Progress Note  Patient: [name] PPI:[id number]   PCP: [name], MD DOB: 1961-01-02   DOA: 08/04/2017   DOS: 08/06/2017   Date of Service: the patient was seen and examined on 08/06/2017  Subjective: Patient reports he is having shortness of breath multiple times during the day.  No chest pain.  Has some dry cough.  No nausea no vomiting no fever no chills.  Brief hospital course: Pt. with PMH of stage IV lung cancer, HTN, seizure, PE, COPD, chronic respiratory failure; admitted on 08/04/2017, presented with complaint of shortness of breath, was found to have progressive COPD. Currently further plan is continue engaging in goals of care discussion and symptom control.  Assessment and Plan: 1.  Suspected COPD exacerbation. Suspected acute on chronic respiratory failure with hypoxia. Patient has multiple presentation to the hospital with complaints of COPD exacerbation as well as shortness of breath. Reportedly had some hypoxia in the ER on home oxygen. Currently 100% on 4 L of oxygen. Recommend patient to maintain saturation between 88 to 92% and dropping down to home oxygen. Also dropping patient's IV steroids to oral Switching PRN nebulizer to inhaler and using scheduled nebulizers. Continue Tussionex and Mucinex. Continue home inhalers as well. Started on IV antibiotics although do not see the necessity of them. Repeated x-ray today, there was concern for some pneumothorax which was ruled out on the second repeat x-ray. At present I suspect that this presentation is secondary to progression of patient's cancer, severe anxiety, poor pain control, bilateral atelectasis, progression of patient's COPD as opposed to any active cardiopulmonary disease. Patient has multiple admissions this year to the hospital and multiple presentation to the ER this year at present I recommend using Klonopin and morphine for symptom control. Also recommend consulting palliative  care for goals of care discussion. Patient definitely has poor insight into his disease process. We will monitor the outcome of the palliative care discussion. Currently DNR/DNI. Patient will benefit from hospice on discharge.  Acute on chronic anemia Normocytic. Likely from chronic disease. No evidence of acute bleeding. Stable.  Thrombocytopenia Acute. Likely secondary to chemotherapy.  Getting better.  Abdominal distension Likely secondary to increased stool. No ascites on ultrasound.  History of pulmonary embolism resume Eliquis.  CKD stage III Baseline of around 1.4-1.6. Stable.  Chronic diastolic heart failure Grade 1 diastolic dysfunction. Continued lasix  Diet: regular diet DVT Prophylaxis Eliquis  Advance goals of care discussion: DNR DNI  Family Communication: no family was present at bedside, at the time of interview.   Disposition:  Discharge to be determined.  Consultants: Palliative care  Procedures: none  Antibiotics: Anti-infectives (From admission, onward)   Start     Dose/Rate Route Frequency Ordered Stop   08/04/17 2100  azithromycin (ZITHROMAX) 500 mg in sodium chloride 0.9 % 250 mL IVPB     500 mg 250 mL/hr over 60 Minutes Intravenous Every 24 hours 08/04/17 2028         Objective: Physical Exam: Vitals:   08/06/17 0426 08/06/17 0735 08/06/17 1432 08/06/17 1509  BP:   (!) 148/80   Pulse:  (!) 106 66 69  Resp:  18 20 19   Temp:   98.7 F (37.1 C)   TempSrc:   Oral   SpO2:  96% 96% 98%  Weight: 57.6 kg (127 lb)     Height:        Intake/Output Summary (Last 24 hours) at 08/06/2017 1820 Last data filed at 08/06/2017 1500 Gross  per 24 hour  Intake 560.83 ml  Output 1725 ml  Net -1164.17 ml   Filed Weights   08/04/17 1427 08/06/17 0426  Weight: 54.4 kg (120 lb) 57.6 kg (127 lb)   General: Alert, Awake and Oriented to Time, Place and Person. Appear in marked distress, affect flat Eyes: PERRL, Conjunctiva normal ENT: Oral  Mucosa clear moist. Neck: no JVD, no Abnormal Mass Or lumps Cardiovascular: S1 and S2 Present, no Murmur, Peripheral Pulses Present Respiratory: increased respiratory effort, Bilateral Air entry equal and Decreased, no use of accessory muscle, bilateral basal Crackles, no wheezes Abdomen: Bowel Sound present, Soft and no tenderness, no hernia Skin: no redness, no Rash, no induration Extremities: trace Pedal edema, no calf tenderness Neurologic: Grossly no focal neuro deficit. Bilaterally Equal motor strength  Data Reviewed: CBC: Recent Labs  Lab 07/31/17 0327 07/31/17 2100 08/01/17 1152 08/04/17 1619 08/04/17 2256 08/05/17 0312  WBC 10.5 11.4* 12.3* 6.4 9.2  --   NEUTROABS  --  10.0*  --  5.0  --   --   HGB 7.6* 7.7* 8.0* 7.2* 8.0*  --   HCT 23.9* 24.3* 24.7* 22.8* 24.3* 21.3*  MCV 103.0* 102.5* 100.8* 102.7* 101.3*  --   PLT 38* 45* 58* 113* 119*  --    Basic Metabolic Panel: Recent Labs  Lab 07/31/17 2100 08/01/17 1152 08/04/17 1619 08/05/17 0312  NA 143 143 143 140  K 3.7 3.6 3.8 3.7  CL 98 100 99 98  CO2 39* 33* 35* 33*  GLUCOSE 97 161* 81 262*  BUN 23* 25* 16 20  CREATININE 1.50* 1.42* 1.55* 1.57*  CALCIUM 9.0 9.1 9.2 8.7*    Liver Function Tests: Recent Labs  Lab 07/31/17 2100 08/01/17 1152 08/05/17 0312  AST 24 24 23   ALT 20 21 15   ALKPHOS 89 102 99  BILITOT 0.4 0.2* 0.2*  PROT 5.6* 6.2* 5.9*  ALBUMIN 2.9* 3.2* 3.0*   Recent Labs  Lab 08/01/17 1152  LIPASE 22   No results for input(s): AMMONIA in the last 168 hours. Coagulation Profile: No results for input(s): INR, PROTIME in the last 168 hours. Cardiac Enzymes: Recent Labs  Lab 07/31/17 2100  TROPONINI 0.03*   BNP (last 3 results) No results for input(s): PROBNP in the last 8760 hours. CBG: No results for input(s): GLUCAP in the last 168 hours. Studies: Dg Chest Port 1 View  Result Date: 08/06/2017 CLINICAL DATA:  Questionable pneumothorax on prior chest radiography, for repeat  assessment. EXAM: PORTABLE CHEST 1 VIEW COMPARISON:  08/06/2017 at 3:39 p.m. FINDINGS: There has been resolution of the prior appearance of edges along the left lateral hemithorax. This confirms that the appearance on the prior scan was due to skin folds. No pneumothorax is currently visible. Stable right greater than left pleural effusions and stable interstitial accentuation in the right lung. Power injectable right Port-A-Cath tip: SVC. IMPRESSION: 1. Repeat chest radiography confirms that the edges seen along the left lateral chest wall were indeed due to skin wrinkles/skin fold rather than pneumothorax. No pneumothorax is visible. Otherwise stable. Electronically Signed   By: Van Clines M.D.   On: 08/06/2017 17:11   Dg Chest Port 1 View  Addendum Date: 08/06/2017   ADDENDUM REPORT: 08/06/2017 16:27 ADDENDUM: The original report was by Dr. Van Clines. The following addendum is by Dr. Van Clines: These results were called by telephone at the time of interpretation on 08/06/2017 at 4:20 pm to Dr. Berle Mull , who verbally acknowledged these results.  We discussed the likelihood of skin fold versus pneumothorax on the left. Dr. Posey Pronto plans to obtain a repeat chest radiograph to reassess. Electronically Signed   By: Van Clines M.D.   On: 08/06/2017 16:27   Result Date: 08/06/2017 CLINICAL DATA:  Per order- SOB. Pt states that his SOB has worsened since yesterday. Hx of lung cancer and HTN. EXAM: PORTABLE CHEST 1 VIEW COMPARISON:  Power injectable right Port-A-Cath tip: SVC. FINDINGS: Several edges over the left lateral chest are probably from skin folds, less likely 5-10% left pneumothorax. Bilateral pleural effusions, right greater than left. Stable hazy interstitial accentuation in the right lung. Stable relative lucency of the left hemithorax. Heart size within normal limits. Stable left perihilar density. IMPRESSION: 1. Peripheral edges along the left chest probably from skin  folds, but I can't totally exclude a 5-10% left pneumothorax. Consider repeat radiography or chest CT. 2. Stable bilateral pleural effusions and stable interstitial accentuation in the right lung. Radiology assistant personnel have been notified to put me in telephone contact with the referring physician or the referring physician's clinical representative in order to discuss these findings. Once this communication is established I will issue an addendum to this report for documentation purposes. Electronically Signed: By: Van Clines M.D. On: 08/06/2017 16:13    Scheduled Meds: . albuterol  2.5 mg Nebulization QID  . apixaban  5 mg Oral BID  . chlorpheniramine-HYDROcodone  5 mL Oral Q12H  . dextromethorphan-guaiFENesin  1 tablet Oral BID  . escitalopram  5 mg Oral QHS  . feeding supplement (ENSURE ENLIVE)  237 mL Oral TID BM  . ferrous sulfate  325 mg Oral BID WC  . fluticasone furoate-vilanterol  1 puff Inhalation Daily  . folic acid  1 mg Oral Daily  . furosemide  40 mg Oral Daily  . gabapentin  100 mg Oral TID  . loratadine  10 mg Oral Daily  . metoprolol tartrate  12.5 mg Oral BID  . multivitamin with minerals  1 tablet Oral Daily  . pantoprazole  40 mg Oral BID  . polyethylene glycol  17 g Oral Daily  . potassium chloride  20 mEq Oral Daily  . [START ON 08/07/2017] predniSONE  50 mg Oral Q breakfast  . senna-docusate  1 tablet Oral BID  . sodium chloride flush  3 mL Intravenous Q12H  . umeclidinium bromide  1 puff Inhalation Daily   Continuous Infusions: . sodium chloride    . azithromycin Stopped (08/06/17 0014)   PRN Meds: sodium chloride, acetaminophen **OR** acetaminophen, albuterol, clonazePAM, hydrOXYzine, morphine, ondansetron, oxyCODONE-acetaminophen, sodium chloride flush, sodium chloride flush  Time spent: 35 minutes  Author: Berle Mull, MD [facility] Pager: [phone] 08/06/2017 6:20 PM  If 7PM-7AM, please contact night-coverage at  [email or web address], password [facility]

## 2017-08-07 DIAGNOSIS — R0600 Dyspnea, unspecified: Secondary | ICD-10-CM

## 2017-08-07 DIAGNOSIS — Z515 Encounter for palliative care: Secondary | ICD-10-CM

## 2017-08-07 LAB — COMPREHENSIVE METABOLIC PANEL
ALK PHOS: 89 U/L (ref 38–126)
ALT: 21 U/L (ref 0–44)
ANION GAP: 8 (ref 5–15)
AST: 23 U/L (ref 15–41)
Albumin: 3 g/dL — ABNORMAL LOW (ref 3.5–5.0)
BILIRUBIN TOTAL: 0.5 mg/dL (ref 0.3–1.2)
BUN: 21 mg/dL — ABNORMAL HIGH (ref 6–20)
CALCIUM: 9.1 mg/dL (ref 8.9–10.3)
CO2: 35 mmol/L — AB (ref 22–32)
CREATININE: 1.51 mg/dL — AB (ref 0.61–1.24)
Chloride: 102 mmol/L (ref 98–111)
GFR, EST AFRICAN AMERICAN: 57 mL/min — AB (ref >60)
GFR, EST NON AFRICAN AMERICAN: 49 mL/min — AB (ref >60)
Glucose, Bld: 104 mg/dL — ABNORMAL HIGH (ref 70–99)
Potassium: 4.2 mmol/L (ref 3.5–5.1)
Sodium: 145 mmol/L (ref 135–145)
TOTAL PROTEIN: 5.8 g/dL — AB (ref 6.5–8.1)

## 2017-08-07 LAB — CBC WITH DIFFERENTIAL/PLATELET
Basophils Absolute: 0 10*3/uL (ref 0.0–0.1)
Basophils Relative: 0 %
EOS ABS: 0 10*3/uL (ref 0.0–0.7)
Eosinophils Relative: 0 %
HCT: 22.7 % — ABNORMAL LOW (ref 39.0–52.0)
HEMOGLOBIN: 7.2 g/dL — AB (ref 13.0–17.0)
LYMPHS ABS: 1 10*3/uL (ref 0.7–4.0)
LYMPHS PCT: 9 %
MCH: 33 pg (ref 26.0–34.0)
MCHC: 31.7 g/dL (ref 30.0–36.0)
MCV: 104.1 fL — AB (ref 78.0–100.0)
MONOS PCT: 7 %
Monocytes Absolute: 0.8 10*3/uL (ref 0.1–1.0)
NEUTROS PCT: 84 %
Neutro Abs: 9.7 10*3/uL — ABNORMAL HIGH (ref 1.7–7.7)
Platelets: 143 10*3/uL — ABNORMAL LOW (ref 150–400)
RBC: 2.18 MIL/uL — AB (ref 4.22–5.81)
RDW: 18.9 % — ABNORMAL HIGH (ref 11.5–15.5)
WBC: 11.5 10*3/uL — AB (ref 4.0–10.5)

## 2017-08-07 LAB — MAGNESIUM: Magnesium: 2.4 mg/dL (ref 1.7–2.4)

## 2017-08-07 MED ORDER — FUROSEMIDE 10 MG/ML IJ SOLN
40.0000 mg | Freq: Every day | INTRAMUSCULAR | Status: DC
  Administered 2017-08-07 – 2017-08-08 (×2): 40 mg via INTRAVENOUS
  Filled 2017-08-07 (×2): qty 4

## 2017-08-07 NOTE — Progress Notes (Signed)
[facility] Progress Note  Patient: [name] VFI:[id number]   PCP: [name], MD DOB: [birth year]   DOA: 08/04/2017   DOS: 08/07/2017   Date of Service: the patient was seen and examined on 08/07/2017  Subjective: Continues to have shortness of breath.  No nausea no vomiting.  Significantly anxious.  Brief hospital course: Pt. with PMH of stage IV lung cancer, HTN, seizure, PE, COPD, chronic respiratory failure; admitted on 08/04/2017, presented with complaint of shortness of breath, was found to have progressive COPD. Currently further plan is continue engaging in goals of care discussion and symptom control.  Assessment and Plan: 1.  Suspected COPD exacerbation. Suspected acute on chronic respiratory failure with hypoxia. Patient has multiple presentation to the hospital with complaints of COPD exacerbation as well as shortness of breath. Reportedly had some hypoxia in the ER on home oxygen. Currently 100% on 4 L of oxygen. Recommend patient to maintain saturation between 88 to 92% and dropping down to home oxygen. Also dropping patient's IV steroids to oral Switching PRN nebulizer to inhaler and using scheduled nebulizers. Continue Tussionex and Mucinex. Continue home inhalers as well. Started on IV antibiotics although do not see the necessity of them. Repeated x-ray today, there was concern for some pneumothorax which was ruled out on the second repeat x-ray. At present I suspect that this presentation is secondary to progression of patient's cancer, severe anxiety, poor pain control, bilateral atelectasis, progression of patient's COPD as opposed to any active cardiopulmonary disease. Patient has multiple admissions this year to the hospital and multiple presentation to the ER this year at present I recommend using Klonopin and morphine for symptom control. Also recommend consulting palliative care for goals of care discussion. Patient definitely has poor insight into  his disease process. We will monitor the outcome of the palliative care discussion. Currently DNR/DNI. Patient will benefit from hospice on discharge.  Acute on chronic anemia Normocytic. Likely from chronic disease. No evidence of acute bleeding. Stable.  Thrombocytopenia Acute. Likely secondary to chemotherapy.  Getting better.  Abdominal distension Likely secondary to increased stool. No ascites on ultrasound.  History of pulmonary embolism resume Eliquis.  CKD stage III Baseline of around 1.4-1.6. Stable.  Chronic diastolic heart failure Grade 1 diastolic dysfunction. Continued lasix I will give IV Lasix for now to see if that improves patient's symptoms.  Diet: regular diet DVT Prophylaxis Eliquis  Advance goals of care discussion: DNR DNI  Family Communication: no family was present at bedside, at the time of interview.   Disposition:  Discharge to be determined.  Consultants: Palliative care  Procedures: none  Antibiotics: Anti-infectives (From admission, onward)   Start     Dose/Rate Route Frequency Ordered Stop   08/04/17 2100  azithromycin (ZITHROMAX) 500 mg in sodium chloride 0.9 % 250 mL IVPB  Status:  Discontinued     500 mg 250 mL/hr over 60 Minutes Intravenous Every 24 hours 08/04/17 2028 08/07/17 0931       Objective: Physical Exam: Vitals:   08/07/17 0409 08/07/17 0824 08/07/17 0831 08/07/17 1249  BP: (!) 150/95   139/84  Pulse: 65   84  Resp: 18   14  Temp: 97.6 F (36.4 C)     TempSrc: Oral     SpO2: 100% 99% 99% 99%  Weight:      Height:        Intake/Output Summary (Last 24 hours) at 08/07/2017 1936 Last data filed at 08/07/2017 1459 Gross per 24 hour  Intake 360 ml  Output 3550 ml  Net -3190 ml   Filed Weights   08/04/17 1427 08/06/17 0426 08/07/17 0407  Weight: 54.4 kg (120 lb) 57.6 kg (127 lb) 59 kg (130 lb)   General: Alert, Awake and Oriented to Time, Place and Person. Appear in marked distress, affect flat Eyes:  PERRL, Conjunctiva normal ENT: Oral Mucosa clear moist. Neck: no JVD, no Abnormal Mass Or lumps Cardiovascular: S1 and S2 Present, no Murmur, Peripheral Pulses Present Respiratory: increased respiratory effort, Bilateral Air entry equal and Decreased, no use of accessory muscle, bilateral basal Crackles, no wheezes Abdomen: Bowel Sound present, Soft and no tenderness, no hernia Skin: no redness, no Rash, no induration Extremities: trace Pedal edema, no calf tenderness Neurologic: Grossly no focal neuro deficit. Bilaterally Equal motor strength  Data Reviewed: CBC: Recent Labs  Lab 07/31/17 2100 08/01/17 1152 08/04/17 1619 08/04/17 2256 08/05/17 0312 08/07/17 0347  WBC 11.4* 12.3* 6.4 9.2  --  11.5*  NEUTROABS 10.0*  --  5.0  --   --  9.7*  HGB 7.7* 8.0* 7.2* 8.0*  --  7.2*  HCT 24.3* 24.7* 22.8* 24.3* 21.3* 22.7*  MCV 102.5* 100.8* 102.7* 101.3*  --  104.1*  PLT 45* 58* 113* 119*  --  782*   Basic Metabolic Panel: Recent Labs  Lab 07/31/17 2100 08/01/17 1152 08/04/17 1619 08/05/17 0312 08/07/17 0347  NA 143 143 143 140 145  K 3.7 3.6 3.8 3.7 4.2  CL 98 100 99 98 102  CO2 39* 33* 35* 33* 35*  GLUCOSE 97 161* 81 262* 104*  BUN 23* 25* 16 20 21*  CREATININE 1.50* 1.42* 1.55* 1.57* 1.51*  CALCIUM 9.0 9.1 9.2 8.7* 9.1  MG  --   --   --   --  2.4    Liver Function Tests: Recent Labs  Lab 07/31/17 2100 08/01/17 1152 08/05/17 0312 08/07/17 0347  AST 24 24 23 23   ALT 20 21 15 21   ALKPHOS 89 102 99 89  BILITOT 0.4 0.2* 0.2* 0.5  PROT 5.6* 6.2* 5.9* 5.8*  ALBUMIN 2.9* 3.2* 3.0* 3.0*   Recent Labs  Lab 08/01/17 1152  LIPASE 22   No results for input(s): AMMONIA in the last 168 hours. Coagulation Profile: No results for input(s): INR, PROTIME in the last 168 hours. Cardiac Enzymes: Recent Labs  Lab 07/31/17 2100  TROPONINI 0.03*   BNP (last 3 results) No results for input(s): PROBNP in the last 8760 hours. CBG: No results for input(s): GLUCAP in the  last 168 hours. Studies: No results found.  Scheduled Meds: . albuterol  2.5 mg Nebulization QID  . apixaban  5 mg Oral BID  . chlorpheniramine-HYDROcodone  5 mL Oral Q12H  . dextromethorphan-guaiFENesin  1 tablet Oral BID  . escitalopram  5 mg Oral QHS  . feeding supplement (ENSURE ENLIVE)  237 mL Oral TID BM  . ferrous sulfate  325 mg Oral BID WC  . fluticasone furoate-vilanterol  1 puff Inhalation Daily  . folic acid  1 mg Oral Daily  . furosemide  40 mg Intravenous Daily  . gabapentin  100 mg Oral TID  . loratadine  10 mg Oral Daily  . metoprolol tartrate  12.5 mg Oral BID  . multivitamin with minerals  1 tablet Oral Daily  . pantoprazole  40 mg Oral BID  . polyethylene glycol  17 g Oral Daily  . potassium chloride  20 mEq Oral Daily  . predniSONE  50 mg Oral  Q breakfast  . senna-docusate  1 tablet Oral BID  . sodium chloride flush  3 mL Intravenous Q12H  . umeclidinium bromide  1 puff Inhalation Daily   Continuous Infusions: . sodium chloride     PRN Meds: sodium chloride, acetaminophen **OR** acetaminophen, albuterol, clonazePAM, hydrOXYzine, morphine, ondansetron, oxyCODONE-acetaminophen, sodium chloride flush, sodium chloride flush  Time spent: 35 minutes  Author: Berle Mull, MD [facility] Pager: [phone] 08/07/2017 7:36 PM  If 7PM-7AM, please contact night-coverage at [email or web address], password [facility]

## 2017-08-07 NOTE — Progress Notes (Signed)
Patient recently discharged on 07/31/2017 and readmitted on 08/04/2017. Patient recently assessed on 07/03/2017, no psychosocial changes reported. CSW and patient spoke about patient's readmission, patient reported that he wasn't feeling well. CSW and patient discussed patient's recent birthday, patient reported that he was at his ALF and his family came to see him. Patient became tearful when speaking about his recent birthday, CSW provided emotional support. Patient reported plan to discharge back to [name]. CSW will continue to follow and assist with discharge planning.   Abundio Miu, [place] Social Worker [facility] Cell#: [phone]

## 2017-08-07 NOTE — Consult Note (Signed)
Consultation Note Date: 08/07/2017   Patient Name: [name]  DOB: 07/25/1960  MRN: [id number]  Age / Sex: 57 y.o., male  PCP: Johnathan Joe, MD Referring Physician: Lavina Hamman, MD  Reason for Consultation: Establishing goals of care  HPI/Patient Profile: 57 y.o. male  with past medical history of stage IV lung cancer, COPD, HTN, CHF, numerous admissions (14 admissions and 16 ED visits in past 6 months) admitted on 08/04/2017 with worsening SOB.  He has been undergoing maintenance treatment with [facility] every 3 weeks.  Palliative consulted for goals of care and symptom management.   Clinical Assessment and Goals of Care: I met today with [name] today.   He reports remembering meeting in the past but endorses not feeling like talking right now.  Chart review reveals that he continues to have significant SOB and anxiety.  Attempted to discuss this today but he indicated that he does not want to discuss further at this point.  Primary Decision Maker PATIENT, HCPOA mother [name] (Living Will in electronic chart)    SUMMARY OF RECOMMENDATIONS   - [name] reports not feeling up to meeting with me today.  Will attempt follow-up tomorrow. - Encourage use of opioids as needed for dyspnea.  He declined to discuss further today.  Code Status/Advance Care Planning:  DNR  Palliative Prophylaxis:   Bowel Regimen, Delirium Protocol, Frequent Pain Assessment and Oral Care  Additional Recommendations (Limitations, Scope, Preferences):  Avoid Hospitalization and Full Scope Treatment  Psycho-social/Spiritual:   Desire for further Chaplaincy support:yes  Additional Recommendations: Education on Hospice  Prognosis:   < 6 months very likely with advanced cancer, increasing symptoms and complications, and co-morbidities.   Discharge Planning: To be determined.      Primary  Diagnoses: Present on Admission: . COPD exacerbation ([place]) . Pulmonary embolism ([place]) . HTN (hypertension) . Anemia . Adenocarcinoma of left lung, stage 4 ([place]) . Chronic respiratory failure with hypoxia ([place])   I have reviewed the medical record, interviewed the patient and family, and examined the patient. The following aspects are pertinent.  Past Medical History:  Diagnosis Date  . Abdominal pain 06/04/2016  . Adenocarcinoma of left lung, stage 4 ([place]) 05/02/2016  . Alcohol abuse   . Bronchitis due to tobacco use ([place])   . Cancer ([place])    Lung  . COPD (chronic obstructive pulmonary disease) ([place])    on home o2 2LNC   . Dehydration 06/04/2016  . Encounter for antineoplastic chemotherapy 05/02/2016  . Gastritis   . Goals of care, counseling/discussion 05/02/2016  . Hematemesis   . HTN (hypertension) 10/30/2016  . Pulmonary embolism ([place]) 06/30/2017   on CTA chest  . Recurrent upper respiratory infection (URI)   . Seizures ([place]) 05/2011   new onset   Social History   Socioeconomic History  . Marital status: Single    Spouse name: Not on file  . Number of children: Not on file  . Years of education: Not on file  . Highest education  level: Not on file  Occupational History  . Not on file  Social Needs  . Financial resource strain: Not on file  . Food insecurity:    Worry: Not on file    Inability: Not on file  . Transportation needs:    Medical: Not on file    Non-medical: Not on file  Tobacco Use  . Smoking status: Current Some Day Smoker    Packs/day: 0.10    Years: 30.00    Pack years: 3.00    Types: Cigarettes  . Smokeless tobacco: Never Used  . Tobacco comment: 1 cig a week  Substance and Sexual Activity  . Alcohol use: No    Frequency: Never  . Drug use: No  . Sexual activity: Yes  Lifestyle  . Physical activity:    Days per week: Not on file    Minutes per session: Not on file  . Stress: Not on file  Relationships  . Social connections:     Talks on phone: Not on file    Gets together: Not on file    Attends religious service: Not on file    Active member of club or organization: Not on file    Attends meetings of clubs or organizations: Not on file    Relationship status: Not on file  Other Topics Concern  . Not on file  Social History Narrative  . Not on file   Family History  Problem Relation Age of Onset  . Cancer Father   . Diabetes Mellitus II Mother    Scheduled Meds: . albuterol  2.5 mg Nebulization QID  . apixaban  5 mg Oral BID  . chlorpheniramine-HYDROcodone  5 mL Oral Q12H  . dextromethorphan-guaiFENesin  1 tablet Oral BID  . escitalopram  5 mg Oral QHS  . feeding supplement (ENSURE ENLIVE)  237 mL Oral TID BM  . ferrous sulfate  325 mg Oral BID WC  . fluticasone furoate-vilanterol  1 puff Inhalation Daily  . folic acid  1 mg Oral Daily  . furosemide  40 mg Intravenous Daily  . gabapentin  100 mg Oral TID  . loratadine  10 mg Oral Daily  . metoprolol tartrate  12.5 mg Oral BID  . multivitamin with minerals  1 tablet Oral Daily  . pantoprazole  40 mg Oral BID  . polyethylene glycol  17 g Oral Daily  . potassium chloride  20 mEq Oral Daily  . predniSONE  50 mg Oral Q breakfast  . senna-docusate  1 tablet Oral BID  . sodium chloride flush  3 mL Intravenous Q12H  . umeclidinium bromide  1 puff Inhalation Daily   Continuous Infusions: . sodium chloride     PRN Meds:.sodium chloride, acetaminophen **OR** acetaminophen, albuterol, clonazePAM, hydrOXYzine, morphine, ondansetron, oxyCODONE-acetaminophen, sodium chloride flush, sodium chloride flush No Known Allergies Review of Systems  Constitutional: Positive for activity change and fatigue. Negative for appetite change.  Respiratory: Positive for shortness of breath.   Gastrointestinal: Positive for abdominal pain.    Physical Exam  Constitutional: He is oriented to person, place, and time. He appears well-developed. He has a sickly appearance.    Thin, frail   HENT:  Head: Normocephalic and atraumatic.  Cardiovascular: Normal rate.  Pulmonary/Chest: Effort normal. No accessory muscle usage. No tachypnea. No respiratory distress.  Abdominal: He exhibits distension. Bowel sounds are decreased. There is tenderness.  Neurological: He is alert and oriented to person, place, and time.  Nursing note and vitals reviewed.   Vital  Signs: BP 139/84 (BP Location: Right Arm)   Pulse 84   Temp 97.6 F (36.4 C) (Oral)   Resp 14   Ht _0  (1.803 m)   Wt 59 kg (130 lb)   SpO2 99%   BMI 18.13 kg/m  Pain Scale: 0-10   Pain Score: 0-No pain   SpO2: SpO2: 99 % O2 Device:SpO2: 99 % O2 Flow Rate: .O2 Flow Rate (L/min): 3 L/min  IO: Intake/output summary:   Intake/Output Summary (Last 24 hours) at 08/07/2017 1709 Last data filed at 08/07/2017 1459 Gross per 24 hour  Intake 480 ml  Output 3550 ml  Net -3070 ml    LBM: Last BM Date: 08/06/17 Baseline Weight: Weight: 54.4 kg (120 lb) Most recent weight: Weight: 59 kg (130 lb)     Palliative Assessment/Data: 50%    Time Total: 40 min  Greater than 50%  of this time was spent counseling and coordinating care related to the above assessment and plan.  Signed by: Micheline Rough, MD [place] Team [phone]

## 2017-08-08 DIAGNOSIS — Z7189 Other specified counseling: Secondary | ICD-10-CM

## 2017-08-08 DIAGNOSIS — R0602 Shortness of breath: Secondary | ICD-10-CM

## 2017-08-08 LAB — CBC WITH DIFFERENTIAL/PLATELET
Basophils Absolute: 0 10*3/uL (ref 0.0–0.1)
Basophils Relative: 0 %
EOS ABS: 0 10*3/uL (ref 0.0–0.7)
EOS PCT: 0 %
HCT: 26.2 % — ABNORMAL LOW (ref 39.0–52.0)
HEMOGLOBIN: 8.1 g/dL — AB (ref 13.0–17.0)
Lymphocytes Relative: 7 %
Lymphs Abs: 1 10*3/uL (ref 0.7–4.0)
MCH: 32.7 pg (ref 26.0–34.0)
MCHC: 30.9 g/dL (ref 30.0–36.0)
MCV: 105.6 fL — ABNORMAL HIGH (ref 78.0–100.0)
MONOS PCT: 4 %
Monocytes Absolute: 0.6 10*3/uL (ref 0.1–1.0)
NEUTROS PCT: 89 %
Neutro Abs: 13 10*3/uL — ABNORMAL HIGH (ref 1.7–7.7)
PLATELETS: 186 10*3/uL (ref 150–400)
RBC: 2.48 MIL/uL — ABNORMAL LOW (ref 4.22–5.81)
RDW: 18.9 % — ABNORMAL HIGH (ref 11.5–15.5)
WBC: 14.6 10*3/uL — AB (ref 4.0–10.5)

## 2017-08-08 LAB — BASIC METABOLIC PANEL
Anion gap: 10 (ref 5–15)
BUN: 23 mg/dL — AB (ref 6–20)
CALCIUM: 9.5 mg/dL (ref 8.9–10.3)
CHLORIDE: 98 mmol/L (ref 98–111)
CO2: 38 mmol/L — ABNORMAL HIGH (ref 22–32)
CREATININE: 1.5 mg/dL — AB (ref 0.61–1.24)
GFR, EST AFRICAN AMERICAN: 58 mL/min — AB (ref >60)
GFR, EST NON AFRICAN AMERICAN: 50 mL/min — AB (ref >60)
Glucose, Bld: 138 mg/dL — ABNORMAL HIGH (ref 70–99)
Potassium: 4 mmol/L (ref 3.5–5.1)
SODIUM: 146 mmol/L — AB (ref 135–145)

## 2017-08-08 LAB — MAGNESIUM: MAGNESIUM: 2.1 mg/dL (ref 1.7–2.4)

## 2017-08-08 MED ORDER — OXYCODONE HCL 5 MG PO TABS: 5.0000 mg | ORAL_TABLET | ORAL | Status: DC | PRN

## 2017-08-08 MED ORDER — OXYCODONE HCL 5 MG PO TABS
5.0000 mg | ORAL_TABLET | Freq: Four times a day (QID) | ORAL | Status: DC
  Administered 2017-08-08 – 2017-08-09 (×4): 5 mg via ORAL
  Filled 2017-08-08 (×4): qty 1

## 2017-08-08 NOTE — Care Management Note (Signed)
Case Management Note  Patient Details  Name: [name] MRN: [id number] Date of Birth: [birth year]  Subjective/Objective:                    Action/Plan:  Palliative to see pt. Will follow for discharge plans.   Expected Discharge Date:  (unknown)               Expected Discharge Plan:  Assisted Living / Rest Home  In-House Referral:  Clinical Social Work  Discharge planning Services  CM Consult  Post Acute Care Choice:    Choice offered to:  Patient  DME Arranged:    DME Agency:  [place]:  PT [place]:  [place]  Status of Service:  In process, will continue to follow  If discussed at Long Length of Stay Meetings, dates discussed:    Additional CommentsPurcell Mouton, RN 08/08/2017, 2:35 PM

## 2017-08-08 NOTE — Progress Notes (Signed)
[facility] Progress Note  Patient: [name] GMW:[id number]   PCP: [name], MD DOB: Mar 13, 1960   DOA: 08/04/2017   DOS: 08/08/2017   Date of Service: the patient was seen and examined on 08/08/2017  Subjective: Reports he short of breath.  No nausea no vomiting no fever no chills.  Brief hospital course: Pt. with PMH of stage IV lung cancer, HTN, seizure, PE, COPD, chronic respiratory failure; admitted on 08/04/2017, presented with complaint of shortness of breath, was found to have progressive COPD. Currently further plan is continue engaging in goals of care discussion and symptom control.  Assessment and Plan: 1.  Suspected COPD exacerbation. Suspected acute on chronic respiratory failure with hypoxia. Patient has multiple presentation to the hospital with complaints of COPD exacerbation as well as shortness of breath. Reportedly had some hypoxia in the ER on home oxygen. Currently 100% on 4 L of oxygen. Recommend patient to maintain saturation between 88 to 92% and dropping down to home oxygen. Also dropping patient's IV steroids to oral Switching PRN nebulizer to inhaler and using scheduled nebulizers. Continue Tussionex and Mucinex. Continue home inhalers as well. Started on IV antibiotics although do not see the necessity of them. Repeated x-ray, there was concern for some pneumothorax which was ruled out on the second repeat x-ray. At present I suspect that this presentation is secondary to progression of patient's cancer, severe anxiety, poor pain control, bilateral atelectasis, progression of patient's COPD as opposed to any active cardiopulmonary disease. Patient has multiple admissions this year to the hospital and multiple presentation to the ER this year at present I recommend using Klonopin and morphine for symptom control. Also recommend consulting palliative care for goals of care discussion. Patient definitely has poor insight into his disease  process. We will monitor the outcome of the palliative care discussion. Currently DNR/DNI. Patient will benefit from hospice on discharge. Hold on IV Lasix and switch to p.o.  Acute on chronic anemia Normocytic. Likely from chronic disease. No evidence of acute bleeding. Stable.  Thrombocytopenia Acute. Likely secondary to chemotherapy.  Getting better.  Abdominal distension Likely secondary to increased stool. No ascites on ultrasound.  History of pulmonary embolism resume Eliquis.  CKD stage III Baseline of around 1.4-1.6. Stable.  Chronic diastolic heart failure Grade 1 diastolic dysfunction. Continued lasix I will give IV Lasix for now to see if that improves patient's symptoms.  Diet: regular diet DVT Prophylaxis Eliquis  Advance goals of care discussion: DNR DNI  Family Communication: no family was present at bedside, at the time of interview.   Disposition:  Discharge to be determined.  Consultants: Palliative care  Procedures: none  Antibiotics: Anti-infectives (From admission, onward)   Start     Dose/Rate Route Frequency Ordered Stop   08/04/17 2100  azithromycin (ZITHROMAX) 500 mg in sodium chloride 0.9 % 250 mL IVPB  Status:  Discontinued     500 mg 250 mL/hr over 60 Minutes Intravenous Every 24 hours 08/04/17 2028 08/07/17 0931       Objective: Physical Exam: Vitals:   08/08/17 0600 08/08/17 0809 08/08/17 1204 08/08/17 1344  BP: 134/76   (!) 157/83  Pulse: 68   71  Resp: 18   16  Temp: 98.2 F (36.8 C)   98.3 F (36.8 C)  TempSrc: Oral   Oral  SpO2: 99% 99% 100% 100%  Weight:      Height:        Intake/Output Summary (Last 24 hours) at 08/08/2017 1420  Last data filed at 08/08/2017 1234 Gross per 24 hour  Intake 330 ml  Output 2600 ml  Net -2270 ml   Filed Weights   08/04/17 1427 08/06/17 0426 08/07/17 0407  Weight: 54.4 kg (120 lb) 57.6 kg (127 lb) 59 kg (130 lb)   General: Alert, Awake and Oriented to Time, Place and Person.  Appear in marked distress, affect flat Eyes: PERRL, Conjunctiva normal ENT: Oral Mucosa clear moist. Neck: no JVD, no Abnormal Mass Or lumps Cardiovascular: S1 and S2 Present, no Murmur, Peripheral Pulses Present Respiratory: increased respiratory effort, Bilateral Air entry equal and Decreased, no use of accessory muscle, bilateral basal Crackles, no wheezes Abdomen: Bowel Sound present, Soft and no tenderness, no hernia Skin: no redness, no Rash, no induration Extremities: trace Pedal edema, no calf tenderness Neurologic: Grossly no focal neuro deficit. Bilaterally Equal motor strength  Data Reviewed: CBC: Recent Labs  Lab 08/04/17 1619 08/04/17 2256 08/05/17 0312 08/07/17 0347 08/08/17 0836  WBC 6.4 9.2  --  11.5* 14.6*  NEUTROABS 5.0  --   --  9.7* 13.0*  HGB 7.2* 8.0*  --  7.2* 8.1*  HCT 22.8* 24.3* 21.3* 22.7* 26.2*  MCV 102.7* 101.3*  --  104.1* 105.6*  PLT 113* 119*  --  143* 270   Basic Metabolic Panel: Recent Labs  Lab 08/04/17 1619 08/05/17 0312 08/07/17 0347  NA 143 140 145  K 3.8 3.7 4.2  CL 99 98 102  CO2 35* 33* 35*  GLUCOSE 81 262* 104*  BUN 16 20 21*  CREATININE 1.55* 1.57* 1.51*  CALCIUM 9.2 8.7* 9.1  MG  --   --  2.4    Liver Function Tests: Recent Labs  Lab 08/05/17 0312 08/07/17 0347  AST 23 23  ALT 15 21  ALKPHOS 99 89  BILITOT 0.2* 0.5  PROT 5.9* 5.8*  ALBUMIN 3.0* 3.0*   No results for input(s): LIPASE, AMYLASE in the last 168 hours. No results for input(s): AMMONIA in the last 168 hours. Coagulation Profile: No results for input(s): INR, PROTIME in the last 168 hours. Cardiac Enzymes: No results for input(s): CKTOTAL, CKMB, CKMBINDEX, TROPONINI in the last 168 hours. BNP (last 3 results) No results for input(s): PROBNP in the last 8760 hours. CBG: No results for input(s): GLUCAP in the last 168 hours. Studies: No results found.  Scheduled Meds: . albuterol  2.5 mg Nebulization QID  . apixaban  5 mg Oral BID  .  chlorpheniramine-HYDROcodone  5 mL Oral Q12H  . dextromethorphan-guaiFENesin  1 tablet Oral BID  . escitalopram  5 mg Oral QHS  . feeding supplement (ENSURE ENLIVE)  237 mL Oral TID BM  . ferrous sulfate  325 mg Oral BID WC  . fluticasone furoate-vilanterol  1 puff Inhalation Daily  . folic acid  1 mg Oral Daily  . furosemide  40 mg Intravenous Daily  . gabapentin  100 mg Oral TID  . loratadine  10 mg Oral Daily  . metoprolol tartrate  12.5 mg Oral BID  . multivitamin with minerals  1 tablet Oral Daily  . pantoprazole  40 mg Oral BID  . polyethylene glycol  17 g Oral Daily  . potassium chloride  20 mEq Oral Daily  . predniSONE  50 mg Oral Q breakfast  . senna-docusate  1 tablet Oral BID  . sodium chloride flush  3 mL Intravenous Q12H  . umeclidinium bromide  1 puff Inhalation Daily   Continuous Infusions: . sodium chloride  PRN Meds: sodium chloride, acetaminophen **OR** acetaminophen, albuterol, clonazePAM, hydrOXYzine, morphine, ondansetron, oxyCODONE-acetaminophen, sodium chloride flush, sodium chloride flush  Time spent: 35 minutes  Author: Berle Mull, MD [facility] Pager: [phone] 08/08/2017 2:20 PM  If 7PM-7AM, please contact night-coverage at [email or web address], password [facility]

## 2017-08-08 NOTE — Progress Notes (Signed)
Initial Nutrition Assessment  DOCUMENTATION CODES:   Severe malnutrition in context of chronic illness, Underweight  INTERVENTION:   - Ensure Enlive po BID, each supplement provides 350 kcal and 20 grams of protein (pt prefers vanilla flavor)  - Liberalize diet to regular  - Continue to encourage adequate PO intake  NUTRITION DIAGNOSIS:   Severe Malnutrition related to chronic illness, cancer and cancer related treatments(COPD, CHF, CKD, stage IV lung cancer currently undergoing chemotherapy) as evidenced by mild fat depletion, moderate fat depletion, severe fat depletion, moderate muscle depletion, severe muscle depletion.  GOAL:   Patient will meet greater than or equal to 90% of their needs  MONITOR:   Supplement acceptance, PO intake, Weight trends, Labs, I & O's, Other (Comment)(GOC)  REASON FOR ASSESSMENT:   Other (Comment)(underweight BMI)    ASSESSMENT:   57 year old male who presented to the ED from SNF with SOB. PMH significant for stage IV lung cancer currently undergoing chemotherapy (last session reported as 1 month ago), COPD on home oxygen, seizures, anemia, diastolic CHF, hypertension, prior PE currently on Eliquis, and CKD.  Spoke with pt at bedside. RN leaving pt room at time of visit. Noted palliative care team consult to determine goals of care.  Pt states that he has a good appetite and is unsure why he is on a Heart Healthy diet. RD paged MD who approved liberalizing diet to regular. Pt reports that he ate eggs and grits this morning at breakfast. Pt reports drinking 0-2 Ensure oral nutrition supplements daily PTA. Pt agreeable to receiving vanilla Ensure Enlive TID during admission.  RD attempted to obtain a dietary recall. However, pt provided limited answers to questions. Pt reports that breakfast may include eggs and grits, lunch may include a sandwich, and dinner may also include a sandwich. Pt states that he "sometimes" eats 100% of meals.  Pt  reports that his weight goes "up and down." Pt denies having a UBW. Noted pt's weight has fluctuated between 107-127 lbs since April 2019. RD suspect weight fluctuations related to fluid status. However, also suspect true weight loss but unable to confirm given diagnosis of CHF and related fluid shifts. Pt endorses his ankles often being "swollen up." Pt shares that he is trying to gain weight.  Meal Completion: 50%  Medications reviewed and include: Ensure Enlive TID, 325 mg ferrous sulfate BID, 1 mg folic acid daily, 40 mg Lasix daily, MVI with minerals daily, 40 mg Protonix BID, Miralax daily, 20 mEq K-dur daily, Senokot-S BID  Labs reviewed: CO2 35 (H), BUN 21 (H), creatinine 1.51 (H), hemoglobin 8.1 (L), HCT 26.2 (L)  UOP: 2625 ml x 24 hours I/O's: -5.6 L since admission  NUTRITION - FOCUSED PHYSICAL EXAM:    Most Recent Value  Orbital Region  Mild depletion  Upper Arm Region  Severe depletion  Thoracic and Lumbar Region  Severe depletion  Buccal Region  Moderate depletion  Temple Region  Mild depletion  Clavicle Bone Region  Severe depletion  Clavicle and Acromion Bone Region  Severe depletion  Scapular Bone Region  Unable to assess  Dorsal Hand  Moderate depletion  Patellar Region  Moderate depletion  Anterior Thigh Region  Moderate depletion  Posterior Calf Region  Severe depletion  Edema (RD Assessment)  Mild [BLE]  Hair  Reviewed  Eyes  Reviewed  Mouth  Reviewed  Skin  Reviewed  Nails  Reviewed       Diet Order:   Diet Order  Diet regular Room service appropriate? Yes; Fluid consistency: Thin  Diet effective now          EDUCATION NEEDS:   No education needs have been identified at this time  Skin:  Skin Assessment: Reviewed RN Assessment  Last BM:  08/06/17  Height:   Ht Readings from Last 1 Encounters:  08/04/17 5\' 11"  (1.803 m)    Weight:   Wt Readings from Last 1 Encounters:  08/07/17 130 lb (59 kg)    Ideal Body Weight:  78.18  kg  BMI:  Body mass index is 18.13 kg/m.  Estimated Nutritional Needs:   Kcal:  1850-2050 kcal/day  Protein:  95-110 grams/day  Fluid:  >/= 1.8 L/day    Gaynell Face, MS, RD, LDN Pager: [phone] Weekend/After Hours: [phone]

## 2017-08-09 LAB — BASIC METABOLIC PANEL
ANION GAP: 7 (ref 5–15)
BUN: 26 mg/dL — ABNORMAL HIGH (ref 6–20)
CALCIUM: 9.6 mg/dL (ref 8.9–10.3)
CHLORIDE: 98 mmol/L (ref 98–111)
CO2: 40 mmol/L — ABNORMAL HIGH (ref 22–32)
CREATININE: 1.48 mg/dL — AB (ref 0.61–1.24)
GFR calc Af Amer: 58 mL/min — ABNORMAL LOW (ref >60)
GFR calc non Af Amer: 50 mL/min — ABNORMAL LOW (ref >60)
GLUCOSE: 103 mg/dL — AB (ref 70–99)
Potassium: 3.7 mmol/L (ref 3.5–5.1)
Sodium: 145 mmol/L (ref 135–145)

## 2017-08-09 MED ORDER — FUROSEMIDE 40 MG PO TABS: 40.0000 mg | ORAL_TABLET | Freq: Two times a day (BID) | ORAL | 0 refills | Status: DC

## 2017-08-09 MED ORDER — CLONAZEPAM 0.5 MG PO TABS: 0.2500 mg | ORAL_TABLET | Freq: Two times a day (BID) | ORAL | 0 refills | Status: DC | PRN

## 2017-08-09 MED ORDER — FUROSEMIDE 40 MG PO TABS
40.0000 mg | ORAL_TABLET | Freq: Two times a day (BID) | ORAL | Status: DC
  Administered 2017-08-09: 40 mg via ORAL
  Filled 2017-08-09: qty 1

## 2017-08-09 MED ORDER — HEPARIN SOD (PORK) LOCK FLUSH 100 UNIT/ML IV SOLN: 500.0000 [IU] | INTRAVENOUS | Status: DC | PRN

## 2017-08-09 MED ORDER — HYDROCOD POLST-CPM POLST ER 10-8 MG/5ML PO SUER: 5.0000 mL | Freq: Two times a day (BID) | ORAL | 0 refills | Status: DC

## 2017-08-09 MED ORDER — PANTOPRAZOLE SODIUM 40 MG PO TBEC: 40.0000 mg | DELAYED_RELEASE_TABLET | Freq: Every day | ORAL | Status: DC

## 2017-08-09 MED ORDER — FLUTICASONE FUROATE-VILANTEROL 100-25 MCG/INH IN AEPB: 1.0000 | INHALATION_SPRAY | Freq: Every day | RESPIRATORY_TRACT | 0 refills | Status: DC

## 2017-08-09 NOTE — Progress Notes (Signed)
Daily Progress Note   Patient Name: [name]       Date: 08/09/2017 DOB: 29-Dec-1960  Age: 57 y.o. MRN#: [id number] Attending Physician: Lavina Hamman, MD Primary Care Physician: [name], MD Admit Date: 08/04/2017  Reason for Consultation/Follow-up: Establishing goals of care and Non pain symptom management  Subjective: I met today with [name].    He remains frustrated and distant.  Attempted to discuss his clinical course over the last few months and he reports that he is going to continue to discuss long term care plan with [name] as he is the only doctor that he feels he can trust.  Length of Stay: 5  Current Medications: Scheduled Meds:  . albuterol  2.5 mg Nebulization QID  . apixaban  5 mg Oral BID  . chlorpheniramine-HYDROcodone  5 mL Oral Q12H  . escitalopram  5 mg Oral QHS  . feeding supplement (ENSURE ENLIVE)  237 mL Oral TID BM  . ferrous sulfate  325 mg Oral BID WC  . fluticasone furoate-vilanterol  1 puff Inhalation Daily  . furosemide  40 mg Oral BID  . gabapentin  100 mg Oral TID  . metoprolol tartrate  12.5 mg Oral BID  . multivitamin with minerals  1 tablet Oral Daily  . oxyCODONE  5 mg Oral Q6H  . [START ON 08/10/2017] pantoprazole  40 mg Oral Daily  . polyethylene glycol  17 g Oral Daily  . potassium chloride  20 mEq Oral Daily  . senna-docusate  1 tablet Oral BID  . sodium chloride flush  3 mL Intravenous Q12H  . umeclidinium bromide  1 puff Inhalation Daily    Continuous Infusions: . sodium chloride      PRN Meds: sodium chloride, acetaminophen **OR** acetaminophen, albuterol, clonazePAM, hydrOXYzine, ondansetron, oxyCODONE, sodium chloride flush, sodium chloride flush  Physical Exam         Constitutional: He is oriented to  person, place, and time. He appears well-developed. He has a sickly appearance.  Thin, frail   HENT:  Head: Normocephalic and atraumatic.  Cardiovascular: Normal rate.  Pulmonary/Chest: Effort normal. No accessory muscle usage. No tachypnea. No respiratory distress.  Neurological: He is alert and oriented to person, place, and time.  Nursing note and vitals reviewed.   Vital Signs: BP (!) 152/92 (BP  Location: Left Arm)   Pulse 65   Temp 98.2 F (36.8 C) (Oral)   Resp 16   Ht 5' 11"  (1.803 m)   Wt 53.1 kg (117 lb 1 oz)   SpO2 97%   BMI 16.33 kg/m  SpO2: SpO2: 97 % O2 Device: O2 Device: Nasal Cannula O2 Flow Rate: O2 Flow Rate (L/min): 3 L/min  Intake/output summary:   Intake/Output Summary (Last 24 hours) at 08/09/2017 1242 Last data filed at 08/09/2017 1100 Gross per 24 hour  Intake 843 ml  Output 2450 ml  Net -1607 ml   LBM: Last BM Date: 08/06/17 Baseline Weight: Weight: 54.4 kg (120 lb) Most recent weight: Weight: 53.1 kg (117 lb 1 oz)       Palliative Assessment/Data:    Flowsheet Rows     Most Recent Value  Intake Tab  Referral Department  Hospitalist  Unit at Time of Referral  Oncology Unit  Palliative Care Primary Diagnosis  Cancer  Date Notified  08/06/17  Reason for referral  Clarify Goals of Care, Non-pain Symptom  Date of Admission  08/04/17  Date first seen by Palliative Care  08/07/17  # of days Palliative referral response time  1 Day(s)  # of days IP prior to Palliative referral  2  Clinical Assessment  Palliative Performance Scale Score  40%  Psychosocial & Spiritual Assessment  Palliative Care Outcomes  Patient/Family meeting held?  No      Patient Active Problem List   Diagnosis Date Noted  . Protein-calorie malnutrition, severe 07/31/2017  . Thrombocytopenia ([place]) 07/29/2017  . Wheezing on exhalation 07/29/2017  . Anemia 07/24/2017  . Normocytic anemia 07/24/2017  . Chronic diastolic (congestive) heart failure ([place]) 07/24/2017  .  Recurrent pleural effusion on right 07/15/2017  . Acute on chronic respiratory failure ([place]) 07/11/2017  . History of pulmonary embolism 07/11/2017  . PE (pulmonary thromboembolism) ([place]) 06/30/2017  . Pulmonary embolism ([place]) 06/30/2017  . AKI (acute kidney injury) ([place]) 06/25/2017  . Dyspnea 06/21/2017  . Chronic combined systolic (congestive) and diastolic (congestive) heart failure ([place])   . Constipation 06/07/2017  . Pain   . Acute on chronic respiratory failure with hypoxia ([place]) 05/28/2017  . Chronic respiratory failure with hypoxia ([place]) 04/01/2017  . Leucocytosis 03/16/2017  . CKD (chronic kidney disease), stage III ([place]) 03/12/2017  . Malnutrition of moderate degree 03/10/2017  . COPD exacerbation ([place]) 03/09/2017  . Macrocytic anemia 02/07/2017  . Severe malnutrition ([place]) 01/03/2017  . DOE (dyspnea on exertion) 12/30/2016  . Nausea   . Hypervolemia   . HCAP (healthcare-associated pneumonia) 12/19/2016  . SOB (shortness of breath) 12/19/2016  . Erosive gastropathy 12/18/2016  . Gastritis 12/18/2016  . Hematemesis 12/16/2016  . Intractable nausea and vomiting 12/16/2016  . HTN (hypertension) 10/30/2016  . Chronic obstructive pulmonary disease ([place]) 09/02/2016  . COPD (chronic obstructive pulmonary disease) ([place]) 08/19/2016  . Dehydration 06/04/2016  . Abdominal pain 06/04/2016  . 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 Adenocarcinoma of left lung, stage 4 ([place]) 05/02/2016  . Goals of care, counseling/discussion 05/02/2016  . Encounter for antineoplastic chemotherapy 05/02/2016  . Tobacco abuse 05/30/2011  . Alcohol abuse 05/30/2011  . Seizure ([place]) 05/28/2011    Palliative Care Assessment & Plan   Patient Profile: 57 y.o. male  with past medical history of stage IV lung cancer, COPD, HTN, CHF, numerous admissions (14 admissions and 16 ED visits in past 6 months) admitted on 08/04/2017 with worsening SOB.  He has been undergoing maintenance treatment  with [facility] every  3 weeks.  Palliative consulted for goals of care and symptom management.   Assessment: Patient Active Problem List   Diagnosis Date Noted  . Protein-calorie malnutrition, severe 07/31/2017  . Thrombocytopenia ([place]) 07/29/2017  . Wheezing on exhalation 07/29/2017  . Anemia 07/24/2017  . Normocytic anemia 07/24/2017  . Chronic diastolic (congestive) heart failure ([place]) 07/24/2017  . Recurrent pleural effusion on right 07/15/2017  . Acute on chronic respiratory failure ([place]) 07/11/2017  . History of pulmonary embolism 07/11/2017  . PE (pulmonary thromboembolism) ([place]) 06/30/2017  . Pulmonary embolism ([place]) 06/30/2017  . AKI (acute kidney injury) ([place]) 06/25/2017  . Dyspnea 06/21/2017  . Chronic combined systolic (congestive) and diastolic (congestive) heart failure ([place])   . Constipation 06/07/2017  . Pain   . Acute on chronic respiratory failure with hypoxia ([place]) 05/28/2017  . Chronic respiratory failure with hypoxia ([place]) 04/01/2017  . Leucocytosis 03/16/2017  . CKD (chronic kidney disease), stage III ([place]) 03/12/2017  . Malnutrition of moderate degree 03/10/2017  . COPD exacerbation ([place]) 03/09/2017  . Macrocytic anemia 02/07/2017  . Severe malnutrition ([place]) 01/03/2017  . DOE (dyspnea on exertion) 12/30/2016  . Nausea   . Hypervolemia   . HCAP (healthcare-associated pneumonia) 12/19/2016  . SOB (shortness of breath) 12/19/2016  . Erosive gastropathy 12/18/2016  . Gastritis 12/18/2016  . Hematemesis 12/16/2016  . Intractable nausea and vomiting 12/16/2016  . HTN (hypertension) 10/30/2016  . Chronic obstructive pulmonary disease ([place]) 09/02/2016  . COPD (chronic obstructive pulmonary disease) ([place]) 08/19/2016  . Dehydration 06/04/2016  . Abdominal pain 06/04/2016  . 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 Adenocarcinoma of left lung, stage 4 ([place]) 05/02/2016  . Goals of care, counseling/discussion 05/02/2016  . Encounter for antineoplastic chemotherapy 05/02/2016  .  Tobacco abuse 05/30/2011  . Alcohol abuse 05/30/2011  . Seizure ([place]) 05/28/2011   Recommendations/Plan: Shortness of breath: Discussed plan to continue oxycodone as needed on discharge for symptom management.  Goals of Care and Additional Recommendations:  Limitations on Scope of Treatment: DNR/DNI but otherwise full scope treatment.  He remains resistant to discuss his long-term expectations/goals with me.  Code Status:    Code Status Orders  (From admission, onward)        Start     Ordered   08/04/17 2029  Do not attempt resuscitation (DNR)  Continuous    Question Answer Comment  In the event of cardiac or respiratory ARREST Do not call a "code blue"   In the event of cardiac or respiratory ARREST Do not perform Intubation, CPR, defibrillation or ACLS   In the event of cardiac or respiratory ARREST Use medication by any route, position, wound care, and other measures to relive pain and suffering. May use oxygen, suction and manual treatment of airway obstruction as needed for comfort.      08/04/17 2028    Code Status History    Date Active Date Inactive Code Status Order ID Comments User Context   07/29/2017 2348 07/31/2017 1910 DNR [id number]  Toy Baker, MD Inpatient   07/27/2017 0701 07/28/2017 1839 DNR [id number]  Jani Gravel, MD ED   07/24/2017 0150 07/25/2017 1703 DNR [id number]  Ivor Costa, MD ED   07/11/2017 0304 07/13/2017 0224 DNR [id number]  Norval Morton, MD ED   07/04/2017 2356 07/09/2017 2151 DNR [id number]  Etta Quill, DO ED   06/30/2017 1015 07/04/2017 0215 DNR [id number]  Shelly Coss, MD ED   06/25/2017 1117 06/29/2017 1603 Full Code [id number]  Shelly Coss,  MD ED   06/21/2017 1044 06/23/2017 2044 DNR [id number]  Mariel Aloe, MD ED   06/05/2017 1602 06/10/2017 2253 Full Code [id number]  Purohit, Konrad Dolores, MD Inpatient   05/28/2017 1147 05/31/2017 2012 DNR [id number]  Nita Sells, MD ED   04/10/2017 1148 04/17/2017 1837 Full Code [id number]  Damita Lack,  MD ED   03/09/2017 2359 03/19/2017 1931 Full Code [id number]  Cristy Folks, MD Inpatient   02/14/2017 0902 02/16/2017 1455 Full Code [id number]  Florencia Reasons, MD Inpatient   02/10/2017 0031 02/14/2017 0902 DNR [id number]  Norval Morton, MD ED   12/30/2016 2325 01/07/2017 2109 DNR [id number]  Elodia [name]., MD Inpatient   12/17/2016 0934 12/24/2016 1811 DNR [id number]  Thurnell Lose, MD Inpatient   12/16/2016 1649 12/17/2016 0934 Full Code [id number]  Charlynne Cousins, MD Inpatient    Advance Directive Documentation     Most Recent Value  Type of Advance Directive  Out of facility DNR (pink MOST or yellow form) [paperwork from nursing home states pt is a dnr]  Pre-existing out of facility DNR order (yellow form or pink MOST form)  -  "MOST" Form in Place?  -       Prognosis:   Unable to determine- if his goal becomes to focus on symptom management, I do think his prognosis without further disease modifying therapy is certainly less than 6 months and he ought to qualify for hospice services at any point moving forward if so desired.  Discharge Planning:  To Be Determined  Care plan was discussed with Dr. Posey Pronto and bedside RN  Thank you for allowing the Palliative Medicine Team to assist in the care of this patient.   Total Time 45 Prolonged Time Billed No      Greater than 50%  of this time was spent counseling and coordinating care related to the above assessment and plan.  Micheline Rough, MD  Please contact Palliative Medicine Team phone at [phone] for questions and concerns.

## 2017-08-09 NOTE — Progress Notes (Signed)
Pt very angry today from very first encounter. Refused his medications from me. MD made aware and discussed with pt importance of taking. Pt said he would take them. Upon asking again pt snapped, "I told you I'm not taking those medications," while MD in room, then told MD he would take them in 30 minutes. Pt not responding to questions I ask and seems as if he is completely ignoring me while I'm in the room. NT got him up to chair and demanded the chair alarm be turned off. Chair alarm off.

## 2017-08-09 NOTE — NC FL2 (Signed)
[place] LEVEL OF CARE SCREENING TOOL     IDENTIFICATION  Patient Name: [name] Birthdate: [birth year] Sex: male Admission Date (Current Location): 08/04/2017  [facility] and [place] Number:  Herbalist and Address:  [facility],  [place] [address] [place]      Provider Number: [id number]  Attending Physician Name and Address:  Lavina Hamman, MD  Relative Name and Phone Number:       Current Level of Care: Hospital Recommended Level of Care: [place] Prior Approval Number:    Date Approved/Denied:   PASRR Number:    Discharge Plan: Other (Comment)(assisted living)    Current Diagnoses: Patient Active Problem List   Diagnosis Date Noted  . Protein-calorie malnutrition, severe 07/31/2017  . Thrombocytopenia ([place]) 07/29/2017  . Wheezing on exhalation 07/29/2017  . Anemia 07/24/2017  . Normocytic anemia 07/24/2017  . Chronic diastolic (congestive) heart failure ([place]) 07/24/2017  . Recurrent pleural effusion on right 07/15/2017  . Acute on chronic respiratory failure ([place]) 07/11/2017  . History of pulmonary embolism 07/11/2017  . PE (pulmonary thromboembolism) ([place]) 06/30/2017  . Pulmonary embolism ([place]) 06/30/2017  . AKI (acute kidney injury) ([place]) 06/25/2017  . Dyspnea 06/21/2017  . Chronic combined systolic (congestive) and diastolic (congestive) heart failure ([place])   . Constipation 06/07/2017  . Pain   . Acute on chronic respiratory failure with hypoxia ([place]) 05/28/2017  . Chronic respiratory failure with hypoxia ([place]) 04/01/2017  . Leucocytosis 03/16/2017  . CKD (chronic kidney disease), stage III ([place]) 03/12/2017  . Malnutrition of moderate degree 03/10/2017  . COPD exacerbation ([place]) 03/09/2017  . Macrocytic anemia 02/07/2017  . Severe malnutrition ([place]) 01/03/2017  . DOE (dyspnea on exertion) 12/30/2016  . Nausea   . Hypervolemia   . HCAP (healthcare-associated pneumonia) 12/19/2016  .  SOB (shortness of breath) 12/19/2016  . Erosive gastropathy 12/18/2016  . Gastritis 12/18/2016  . Hematemesis 12/16/2016  . Intractable nausea and vomiting 12/16/2016  . HTN (hypertension) 10/30/2016  . Chronic obstructive pulmonary disease ([place]) 09/02/2016  . COPD (chronic obstructive pulmonary disease) ([place]) 08/19/2016  . Dehydration 06/04/2016  . Abdominal pain 06/04/2016  . 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 Adenocarcinoma of left lung, stage 4 ([place]) 05/02/2016  . Goals of care, counseling/discussion 05/02/2016  . Encounter for antineoplastic chemotherapy 05/02/2016  . Tobacco abuse 05/30/2011  . Alcohol abuse 05/30/2011  . Seizure ([place]) 05/28/2011    Orientation RESPIRATION BLADDER Height & Weight     Self, Time, Situation, Place  O2(4liters) Continent Weight: 117 lb 1 oz (53.1 kg) Height:  5\' 11"  (180.3 cm)  BEHAVIORAL SYMPTOMS/MOOD NEUROLOGICAL BOWEL NUTRITION STATUS      Continent (cardiac diet- heart healthy)  AMBULATORY STATUS COMMUNICATION OF NEEDS Skin   Supervision Verbally Normal                       Personal Care Assistance Level of Assistance  Bathing, Feeding, Dressing Bathing Assistance: Limited assistance Feeding assistance: Independent Dressing Assistance: Limited assistance     Functional Limitations Info  Sight, Hearing, Speech Sight Info: Adequate Hearing Info: Adequate Speech Info: Adequate    SPECIAL CARE FACTORS FREQUENCY                       Contractures Contractures Info: Not present    Additional Factors Info  Code Status, Allergies Code Status Info: DNR Allergies Info: nka  Current Medications (08/09/2017):  This is the current hospital active medication list Current Facility-Administered Medications  Medication Dose Route Frequency Provider Last Rate Last Dose  . 0.9 %  sodium chloride infusion  250 mL Intravenous PRN Jani Gravel, MD      . acetaminophen (TYLENOL) tablet 650 mg  650 mg Oral Q6H PRN  Jani Gravel, MD       Or  . acetaminophen (TYLENOL) suppository 650 mg  650 mg Rectal Q6H PRN Jani Gravel, MD      . albuterol (PROVENTIL) (2.5 MG/3ML) 0.083% nebulizer solution 2.5 mg  2.5 mg Nebulization Q4H PRN Lavina Hamman, MD   2.5 mg at 08/09/17 0216  . albuterol (PROVENTIL) (2.5 MG/3ML) 0.083% nebulizer solution 2.5 mg  2.5 mg Nebulization QID Lavina Hamman, MD   2.5 mg at 08/09/17 1123  . apixaban (ELIQUIS) tablet 5 mg  5 mg Oral BID Jani Gravel, MD   5 mg at 08/09/17 1009  . chlorpheniramine-HYDROcodone (TUSSIONEX) 10-8 MG/5ML suspension 5 mL  5 mL Oral Q12H Rizwan, Saima, MD   5 mL at 08/09/17 1010  . clonazePAM (KLONOPIN) tablet 0.25 mg  0.25 mg Oral BID PRN Lavina Hamman, MD   0.25 mg at 08/07/17 0405  . escitalopram (LEXAPRO) tablet 5 mg  5 mg Oral QHS Jani Gravel, MD   5 mg at 08/08/17 2119  . feeding supplement (ENSURE ENLIVE) (ENSURE ENLIVE) liquid 237 mL  237 mL Oral TID BM Lavina Hamman, MD   237 mL at 08/05/17 2044  . ferrous sulfate tablet 325 mg  325 mg Oral BID WC Jani Gravel, MD   325 mg at 08/09/17 1009  . fluticasone furoate-vilanterol (BREO ELLIPTA) 100-25 MCG/INH 1 puff  1 puff Inhalation Daily Jani Gravel, MD   1 puff at 08/09/17 0805  . furosemide (LASIX) tablet 40 mg  40 mg Oral BID Lavina Hamman, MD   40 mg at 08/09/17 1015  . gabapentin (NEURONTIN) capsule 100 mg  100 mg Oral TID Jani Gravel, MD   100 mg at 08/09/17 1010  . hydrOXYzine (ATARAX/VISTARIL) tablet 10 mg  10 mg Oral TID PRN Jani Gravel, MD   10 mg at 08/06/17 2016  . metoprolol tartrate (LOPRESSOR) tablet 12.5 mg  12.5 mg Oral BID Jani Gravel, MD   12.5 mg at 08/09/17 1009  . multivitamin with minerals tablet 1 tablet  1 tablet Oral Daily Jani Gravel, MD   1 tablet at 08/09/17 1009  . ondansetron (ZOFRAN-ODT) disintegrating tablet 4 mg  4 mg Oral Q8H PRN Jani Gravel, MD      . oxyCODONE (Oxy IR/ROXICODONE) immediate release tablet 5 mg  5 mg Oral Q6H Micheline Rough, MD   5 mg at 08/09/17 1221  . oxyCODONE  (Oxy IR/ROXICODONE) immediate release tablet 5 mg  5 mg Oral Q3H PRN Micheline Rough, MD      . Derrill Memo ON 08/10/2017] pantoprazole (PROTONIX) EC tablet 40 mg  40 mg Oral Daily Lavina Hamman, MD      . polyethylene glycol (MIRALAX / GLYCOLAX) packet 17 g  17 g Oral Daily Jani Gravel, MD   17 g at 08/07/17 1000  . potassium chloride (K-DUR) CR tablet 20 mEq  20 mEq Oral Daily Jani Gravel, MD   20 mEq at 08/09/17 1010  . senna-docusate (Senokot-S) tablet 1 tablet  1 tablet Oral BID Jani Gravel, MD   1 tablet at 08/07/17 2202  . sodium chloride flush (NS) 0.9 % injection  10-40 mL  10-40 mL Intracatheter PRN Jani Gravel, MD   10 mL at 08/08/17 1521  . sodium chloride flush (NS) 0.9 % injection 3 mL  3 mL Intravenous Q12H Jani Gravel, MD   3 mL at 08/08/17 2122  . sodium chloride flush (NS) 0.9 % injection 3 mL  3 mL Intravenous PRN Jani Gravel, MD      . umeclidinium bromide (INCRUSE ELLIPTA) 62.5 MCG/INH 1 puff  1 puff Inhalation Daily Jani Gravel, MD   1 puff at 08/09/17 0805     Discharge Medications: TAKE these medications   acetaminophen 500 MG tablet Commonly known as:  TYLENOL Take 1 tablet (500 mg total) by mouth 2 (two) times daily as needed for mild pain.   albuterol (2.5 MG/3ML) 0.083% nebulizer solution Commonly known as:  PROVENTIL 1 neb every 4-6 hours as needed for wheezing and shortness of breath What changed:    how much to take  how to take this  when to take this  reasons to take this  additional instructions   ALKA-SELTZER PLUS COLD PO Take 2 tablets by mouth at bedtime as needed (COUGH).   chlorpheniramine-HYDROcodone 10-8 MG/5ML Suer Commonly known as:  TUSSIONEX Take 5 mLs by mouth every 12 (twelve) hours.   clonazePAM 0.5 MG tablet Commonly known as:  KLONOPIN Take 0.5 tablets (0.25 mg total) by mouth 2 (two) times daily as needed (anxiety).   dexamethasone 4 MG tablet Commonly known as:  DECADRON Take 4 mg by mouth 2 (two) times daily. The day before,  the day of, and the day after chemotherapy every three weeks   ELIQUIS 5 MG Tabs tablet Generic drug:  apixaban Take 5 mg by mouth 2 (two) times daily.   escitalopram 5 MG tablet Commonly known as:  LEXAPRO Take 1 tablet (5 mg total) by mouth at bedtime.   feeding supplement (ENSURE ENLIVE) Liqd Take 237 mLs by mouth 3 (three) times daily between meals.   ferrous sulfate 325 (65 FE) MG tablet Take 1 tablet (325 mg total) by mouth 2 (two) times daily with a meal.   fluticasone furoate-vilanterol 100-25 MCG/INH Aepb Commonly known as:  BREO ELLIPTA Inhale 1 puff into the lungs daily. Start taking on:  08/10/2017   Fluticasone-Umeclidin-Vilant 100-62.5-25 MCG/INH Aepb Commonly known as:  TRELEGY ELLIPTA Inhale 1 puff into the lungs daily.   folic acid 1 MG tablet Commonly known as:  FOLVITE Take 1 tablet (1 mg total) by mouth daily.   furosemide 40 MG tablet Commonly known as:  LASIX Take 1 tablet (40 mg total) by mouth 2 (two) times daily. What changed:  when to take this   gabapentin 100 MG capsule Commonly known as:  NEURONTIN Take 1 capsule (100 mg total) by mouth 3 (three) times daily.   hydrOXYzine 10 MG tablet Commonly known as:  ATARAX/VISTARIL Take 1 tablet (10 mg total) by mouth 3 (three) times daily as needed for anxiety.   COMBIVENT RESPIMAT 20-100 MCG/ACT Aers respimat Generic drug:  Ipratropium-Albuterol Inhale 1 puff into the lungs every 6 (six) hours.   ipratropium-albuterol 0.5-2.5 (3) MG/3ML Soln Commonly known as:  DUONEB Take 3 mLs by nebulization 4 (four) times daily.   loratadine 10 MG tablet Commonly known as:  CLARITIN Take 10 mg by mouth daily.   metoprolol tartrate 25 MG tablet Commonly known as:  LOPRESSOR Take 12.5 mg by mouth 2 (two) times daily.   MUCINEX 600 MG 12 hr tablet Generic drug:  guaiFENesin Take  1,200 mg by mouth 2 (two) times daily.   ondansetron 4 MG disintegrating tablet Commonly known as:   ZOFRAN-ODT Take 4 mg by mouth every 8 (eight) hours as needed for nausea or vomiting.   Oxycodone HCl 10 MG Tabs Take 1 tablet (10 mg total) by mouth every 4 (four) hours as needed for up to 15 doses (pain/shortness of breath).   oxyCODONE-acetaminophen 5-325 MG tablet Commonly known as:  PERCOCET Take 1 tablet by mouth every 4 (four) hours as needed for severe pain.   pantoprazole 40 MG tablet Commonly known as:  PROTONIX Take 1 tablet (40 mg total) by mouth 2 (two) times daily.   polyethylene glycol packet Commonly known as:  MIRALAX / GLYCOLAX Take 17 g by mouth daily.   potassium chloride 10 MEQ tablet Commonly known as:  K-DUR Take 2 tablets (20 mEq total) by mouth daily.   senna 8.6 MG Tabs tablet Commonly known as:  SENOKOT Take 1 tablet by mouth 2 (two) times daily.   senna-docusate 8.6-50 MG tablet Commonly known as:  Senokot-S Take 2 tablets by mouth 2 (two) times daily. What changed:  Another medication with the same name was changed. Make sure you understand how and when to take each.   senna-docusate 8.6-50 MG tablet Commonly known as:  Senokot-S Take 2 tablets by mouth 2 (two) times daily. What changed:  how much to take   sucralfate 1 GM/10ML suspension Commonly known as:  CARAFATE Take 10 mLs (1 g total) by mouth 4 (four) times daily -  with meals and at bedtime.   THERA-TABS PO Take 1 tablet by mouth daily.   TUSSIN DM 10-100 MG/5ML liquid Generic drug:  Dextromethorphan-guaiFENesin Take 5 mLs by mouth every 12 (twelve) hours.   vitamin B-12 1000 MCG tablet Commonly known as:  CYANOCOBALAMIN Take 1 tablet (1,000 mcg total) by mouth daily.      No Known Allergies     Discharge Instructions    Diet - low sodium heart healthy   Complete by:  As directed      Relevant Imaging Results:  Relevant Lab Results:   Additional Information SSN [id number]  Nila Nephew, LCSW

## 2017-08-09 NOTE — Discharge Summary (Signed)
[facility] Discharge Summary   Patient: [name] HCW:[id number]   PCP: [name], MD DOB: 02-23-1960   Date of admission: 08/04/2017   Date of discharge:  08/09/2017    Discharge Diagnoses:  Principal Problem:   COPD exacerbation ([place]) Active Problems:   Adenocarcinoma of left lung, stage 4 (HCC)   HTN (hypertension)   Chronic respiratory failure with hypoxia ([place])   Pulmonary embolism (HCC)   Anemia   Admitted From: ALF Disposition:  ALF  Recommendations for Outpatient Follow-up:  1. Please follow-up with PCP in 1 week. 2. Please continue to engage with discussion regarding goals of care as well as palliative care.  Follow-up Information    [name], MD. Schedule an appointment as soon as possible for a visit in 1 week(s).   Specialty:  Neurology Contact information: [place] [address] [phone]        [facility].   Specialty:  [name] Why:  Homer will call to arrange visit Contact information: [address] [place] [address] [phone]          Diet recommendation: Cardiac diet  Activity: The patient is advised to gradually reintroduce usual activities.  Discharge Condition: good  Code Status: DNR/DNI  History of present illness: As per the H and P dictated on admission, "Burns Timson  is a 57 y.o. male, w hypertension, seizure do, stage 4 lung cancer, pulmonary embolism, Copd on home o2, apparently c/o dyspnea for the past 3 days, along with dry cough.  Pt denies fever, chills, cp, palp, n/v, diarrhea, brbpr. "  Hospital Course:  Summary of his active problems in the hospital is as following. Suspected COPD exacerbation. Suspected acute on chronic respiratory failure with hypoxia. COPD exacerbation ruled out. Acute on chronic diastolic CHF  Patient has multiple presentation to the hospital with complaints of  COPD exacerbation as well as shortness of breath. Reportedly had some hypoxia in the ER on home oxygen. Currently 100% on 3L of oxygen. Recommend patient to maintain saturation between 88 to 92% and dropping down to home oxygen. Patient was given steroids in the hospital do not think the patient will benefit from continuation of the steroids on discharge. Switching PRN nebulizer to inhaler and using scheduled nebulizers. Continue Tussionex and Mucinex. Continue home inhalers as well. Started on IV antibiotics although do not see the necessity of them. Repeated x-ray, there was concern for some pneumothorax which was ruled out on the second repeat x-ray. At present I suspect that this presentation is secondary to progression of patient's cancer, severe anxiety, poor pain control, bilateral atelectasis, progression of patient's COPD as well as CHF, as opposed to any active cardiopulmonary disease. Patient has multiple admissions this year to the hospital and multiple presentation to the ER this year at present I recommend using the benzodiazepine and opioids  Patient definitely has poor insight into his disease process. Palliative care was consulted, also discussed with patient's mother after getting patient's permission that patient may have a better quality of life going forward on hospice as compared to current management of treating what is treatable.  Currently DNR/DNI.  She was given IV Lasix, -8 L in the hospital. We will transition to oral Lasix 40 mg twice daily. I highly doubt that the patient is compliant with his medical regimen at home.  Acute on chronic anemia Normocytic. Likely from chronic disease. No evidence of  acute bleeding.Stable.  Thrombocytopenia Acute. Likely secondary to chemotherapy.  Getting better.  Abdominal distension Likely secondary to increased stool. No ascites on ultrasound.  History of pulmonary embolism resume Eliquis.  CKD stage  III Baseline of around 1.4-1.6. Stable.  All other chronic medical condition were stable during the hospitalization.  Patient was ambulatory without any assistance. On the day of the discharge the patient's vitals were stable , and no other acute medical condition were reported by patient. the patient was felt safe to be discharge at ALF with therapy.  Consultants: Palliative care  Procedures: none  DISCHARGE MEDICATION: Allergies as of 08/09/2017   No Known Allergies     Medication List    TAKE these medications   acetaminophen 500 MG tablet Commonly known as:  TYLENOL Take 1 tablet (500 mg total) by mouth 2 (two) times daily as needed for mild pain.   albuterol (2.5 MG/3ML) 0.083% nebulizer solution Commonly known as:  PROVENTIL 1 neb every 4-6 hours as needed for wheezing and shortness of breath What changed:    how much to take  how to take this  when to take this  reasons to take this  additional instructions   ALKA-SELTZER PLUS COLD PO Take 2 tablets by mouth at bedtime as needed (COUGH).   chlorpheniramine-HYDROcodone 10-8 MG/5ML Suer Commonly known as:  TUSSIONEX Take 5 mLs by mouth every 12 (twelve) hours.   clonazePAM 0.5 MG tablet Commonly known as:  KLONOPIN Take 0.5 tablets (0.25 mg total) by mouth 2 (two) times daily as needed (anxiety).   dexamethasone 4 MG tablet Commonly known as:  DECADRON Take 4 mg by mouth 2 (two) times daily. The day before, the day of, and the day after chemotherapy every three weeks   ELIQUIS 5 MG Tabs tablet Generic drug:  apixaban Take 5 mg by mouth 2 (two) times daily.   escitalopram 5 MG tablet Commonly known as:  LEXAPRO Take 1 tablet (5 mg total) by mouth at bedtime.   feeding supplement (ENSURE ENLIVE) Liqd Take 237 mLs by mouth 3 (three) times daily between meals.   ferrous sulfate 325 (65 FE) MG tablet Take 1 tablet (325 mg total) by mouth 2 (two) times daily with a meal.   fluticasone furoate-vilanterol  100-25 MCG/INH Aepb Commonly known as:  BREO ELLIPTA Inhale 1 puff into the lungs daily. Start taking on:  08/10/2017   Fluticasone-Umeclidin-Vilant 100-62.5-25 MCG/INH Aepb Commonly known as:  TRELEGY ELLIPTA Inhale 1 puff into the lungs daily.   folic acid 1 MG tablet Commonly known as:  FOLVITE Take 1 tablet (1 mg total) by mouth daily.   furosemide 40 MG tablet Commonly known as:  LASIX Take 1 tablet (40 mg total) by mouth 2 (two) times daily. What changed:  when to take this   gabapentin 100 MG capsule Commonly known as:  NEURONTIN Take 1 capsule (100 mg total) by mouth 3 (three) times daily.   hydrOXYzine 10 MG tablet Commonly known as:  ATARAX/VISTARIL Take 1 tablet (10 mg total) by mouth 3 (three) times daily as needed for anxiety.   COMBIVENT RESPIMAT 20-100 MCG/ACT Aers respimat Generic drug:  Ipratropium-Albuterol Inhale 1 puff into the lungs every 6 (six) hours.   ipratropium-albuterol 0.5-2.5 (3) MG/3ML Soln Commonly known as:  DUONEB Take 3 mLs by nebulization 4 (four) times daily.   loratadine 10 MG tablet Commonly known as:  CLARITIN Take 10 mg by mouth daily.   metoprolol tartrate 25 MG tablet Commonly known as:  LOPRESSOR Take 12.5 mg by mouth 2 (two) times daily.   MUCINEX 600 MG 12 hr tablet Generic drug:  guaiFENesin Take 1,200 mg by mouth 2 (two) times daily.   ondansetron 4 MG disintegrating tablet Commonly known as:  ZOFRAN-ODT Take 4 mg by mouth every 8 (eight) hours as needed for nausea or vomiting.   Oxycodone HCl 10 MG Tabs Take 1 tablet (10 mg total) by mouth every 4 (four) hours as needed for up to 15 doses (pain/shortness of breath).   oxyCODONE-acetaminophen 5-325 MG tablet Commonly known as:  PERCOCET Take 1 tablet by mouth every 4 (four) hours as needed for severe pain.   pantoprazole 40 MG tablet Commonly known as:  PROTONIX Take 1 tablet (40 mg total) by mouth 2 (two) times daily.   polyethylene glycol packet Commonly  known as:  MIRALAX / GLYCOLAX Take 17 g by mouth daily.   potassium chloride 10 MEQ tablet Commonly known as:  K-DUR Take 2 tablets (20 mEq total) by mouth daily.   senna 8.6 MG Tabs tablet Commonly known as:  SENOKOT Take 1 tablet by mouth 2 (two) times daily.   senna-docusate 8.6-50 MG tablet Commonly known as:  Senokot-S Take 2 tablets by mouth 2 (two) times daily. What changed:  Another medication with the same name was changed. Make sure you understand how and when to take each.   senna-docusate 8.6-50 MG tablet Commonly known as:  Senokot-S Take 2 tablets by mouth 2 (two) times daily. What changed:  how much to take   sucralfate 1 GM/10ML suspension Commonly known as:  CARAFATE Take 10 mLs (1 g total) by mouth 4 (four) times daily -  with meals and at bedtime.   THERA-TABS PO Take 1 tablet by mouth daily.   TUSSIN DM 10-100 MG/5ML liquid Generic drug:  Dextromethorphan-guaiFENesin Take 5 mLs by mouth every 12 (twelve) hours.   vitamin B-12 1000 MCG tablet Commonly known as:  CYANOCOBALAMIN Take 1 tablet (1,000 mcg total) by mouth daily.      No Known Allergies Discharge Instructions    Diet - low sodium heart healthy   Complete by:  As directed    Discharge instructions   Complete by:  As directed    It is important that you read following instructions as well as go over your medication list with RN to help you understand your care after this hospitalization.  Discharge Instructions: Please follow-up with PCP in one week  Please request your primary care physician to go over all Hospital Tests and Procedure/Radiological results at the follow up,  Please get all Hospital records sent to your PCP by signing hospital release before you go home.   Do not take more than prescribed Pain, Sleep and Anxiety Medications. You were cared for by a hospitalist during your hospital stay. If you have any questions about your discharge medications or the care you received  while you were in the hospital after you are discharged, you can call the unit and ask to speak with the hospitalist on call if the hospitalist that took care of you is not available.  Once you are discharged, your primary care physician will handle any further medical issues. Please note that NO REFILLS for any discharge medications will be authorized once you are discharged, as it is imperative that you return to your primary care physician (or establish a relationship with a primary care physician if you do not have one) for your aftercare needs so that they  can reassess your need for medications and monitor your lab values. You Must read complete instructions/literature along with all the possible adverse reactions/side effects for all the Medicines you take and that have been prescribed to you. Take any new Medicines after you have completely understood and accept all the possible adverse reactions/side effects. Wear Seat belts while driving. If you have smoked or chewed Tobacco in the last 2 yrs please stop smoking and/or stop any Recreational drug use.   Increase activity slowly   Complete by:  As directed      Discharge Exam: Filed Weights   08/06/17 0426 08/07/17 0407 08/09/17 0502  Weight: 57.6 kg (127 lb) 59 kg (130 lb) 53.1 kg (117 lb 1 oz)   Vitals:   08/09/17 0809 08/09/17 1009  BP:    Pulse:  65  Resp:    Temp:    SpO2: 97%    General: Appear in mild distress, no Rash; Oral Mucosa moist. Cardiovascular: S1 and S2 Present, no Murmur, no JVD Respiratory: Bilateral Air entry present and Clear to Auscultation, no Crackles, no wheezes Abdomen: Bowel Sound present, Soft and no tenderness Extremities: no Pedal edema, no calf tenderness Neurology: Grossly no focal neuro deficit.  The results of significant diagnostics from this hospitalization (including imaging, microbiology, ancillary and laboratory) are listed below for reference.    Significant Diagnostic Studies: Dg Chest  1 View  Result Date: 07/11/2017 CLINICAL DATA:  Status post thoracentesis on the right EXAM: CHEST  1 VIEW COMPARISON:  07/11/2017 FINDINGS: Cardiac shadows within normal limits. Right chest wall port is again seen and stable. Interval right thoracentesis has been performed. A minimal basilar pneumothorax is noted. This is likely related incomplete re-expansion of the right lower lobe. Correlation with patient's symptomatology is recommended. No other focal abnormality is seen. Stable left pleural effusion is noted. IMPRESSION: Small right basilar pneumothorax. This is likely related incomplete re-expansion of the lower lobe. Correlate with patient's symptomatology. Electronically Signed   By: Inez Catalina M.D.   On: 07/11/2017 13:47   Dg Chest 2 View  Result Date: 08/04/2017 CLINICAL DATA:  Dyspnea.  Left lung cancer on chemotherapy. EXAM: CHEST - 2 VIEW COMPARISON:  08/01/2017 chest radiograph. FINDINGS: Right internal jugular MediPort terminates at the cavoatrial junction. Stable cardiomediastinal silhouette with top-normal heart size. No pneumothorax. Stable small right pleural effusion. Stable trace left pleural effusion. Hyperinflated lungs and emphysema. Stable small left upper parahilar nodular opacity. Mild diffuse prominence of central interstitial markings, slightly increased. Patchy right lung base opacity is unchanged. No acute consolidative airspace disease. IMPRESSION: 1. Mild diffuse prominence of the central interstitial markings, slightly increased, cannot exclude mild pulmonary edema. 2. Stable small left upper parahilar nodular opacity compatible with known neoplasm. 3. Stable small right and trace left pleural effusions. 4. Stable patchy right lung base opacity, favor atelectasis. 5. Hyperinflated lungs and emphysema, suggesting COPD. Electronically Signed   By: Ilona Sorrel M.D.   On: 08/04/2017 16:48   Dg Chest 2 View  Result Date: 07/31/2017 CLINICAL DATA:  Initial evaluation for  acute generalized chest pain, shortness of breath. EXAM: CHEST - 2 VIEW COMPARISON:  Prior radiograph and CT from 07/29/2017 FINDINGS: Right-sided Port-A-Cath in place with tip in the proximal right atrium. Cardiac and mediastinal silhouettes are stable, and remain within normal limits. Lungs normally inflated. Persistent right greater than left layering pleural effusions, stable. Associated right basilar opacity also unchanged. Left suprahilar nodular density is unchanged. No new focal airspace disease. No pneumothorax.  Underlying emphysema noted. Osseous structures unchanged. IMPRESSION: 1. Stable appearance of the chest with small right greater than left layering pleural effusions with associated right basilar opacity, likely atelectasis. 2. Underlying emphysema. Electronically Signed   By: Jeannine Boga M.D.   On: 07/31/2017 20:23   Dg Chest 2 View  Result Date: 07/27/2017 CLINICAL DATA:  Chronic shortness of breath EXAM: CHEST - 2 VIEW COMPARISON:  07/23/2017, 07/20/2017, 07/18/2017, CT chest 06/30/2017 FINDINGS: Right-sided central venous port tip overlies the cavoatrial region. There are small bilateral pleural effusions without significant change. Emphysema left greater than right. Spiculated opacity in the left suprahilar lung, no change. Normal heart size. No pneumothorax. IMPRESSION: No significant interval change compared to recent priors with right greater than left pleural effusion, emphysema and spiculated left suprahilar lung mass. Electronically Signed   By: Donavan Foil M.D.   On: 07/27/2017 00:25   Dg Chest 2 View  Result Date: 07/20/2017 CLINICAL DATA:  Shortness of breath. EXAM: CHEST - 2 VIEW COMPARISON:  Chest x-ray from yesterday. FINDINGS: Unchanged right chest wall port catheter. The heart size and mediastinal contours are within normal limits. Normal pulmonary vascularity. The lungs remain hyperinflated with emphysematous changes. Increased interstitial markings in the right  lung are similar to prior study. Unchanged small right pleural effusion. Unchanged left upper lobe spiculated mass. No acute osseous abnormality. IMPRESSION: 1. COPD with unchanged left upper lobe spiculated mass and small right pleural effusion. Electronically Signed   By: Titus Dubin M.D.   On: 07/20/2017 23:28   Dg Chest 2 View  Result Date: 07/18/2017 CLINICAL DATA:  Lung cancer. EXAM: CHEST - 2 VIEW COMPARISON:  Chest x-ray 07/16/2017, 05/28/2017, 03/17/2017. CT 03/13/2017. FINDINGS: PowerPort catheter noted with tip over right atrium. Cardiomegaly with normal pulmonary vascularity. Hyperexpansion of both lung fields noted consistent with COPD. Mild mid lung field basilar subsegmental atelectasis. Unchanged bilateral basilar pleural thickening consistent with persistent effusions and or scarring. IMPRESSION: 1.  PowerPort catheter noted in stable position. 2. COPD. Mild subsegmental atelectasis noted the mid lung fields and both bases. Mild bilateral basilar pleural thickening consistent with persistent effusions and/or scarring. No acute abnormality identified. 3.  Stable cardiomegaly.  No pulmonary venous congestion. Electronically Signed   By: Marcello Moores  Register   On: 07/18/2017 14:02   Dg Chest 2 View  Result Date: 07/16/2017 CLINICAL DATA:  Weakness, dyspnea EXAM: CHEST - 2 VIEW COMPARISON:  07/13/2017 chest radiograph. FINDINGS: Right subclavian MediPort terminates at the cavoatrial junction. Stable cardiomediastinal silhouette with normal heart size. No pneumothorax. Stable blunting of the right greater than left costophrenic angles. No pulmonary edema. Severe emphysema, asymmetrically prominent in the left lung, with lung hyperinflation. No acute consolidative airspace disease. Stable mild scarring at the lung bases. IMPRESSION: 1. No acute cardiopulmonary disease. 2. Lung hyperinflation and severe emphysema asymmetrically involving the left lung, compatible with COPD. 3. Stable blunting of  the right greater than left costophrenic angles, which could represent small pleural effusions and/or pleural-parenchymal scarring. Electronically Signed   By: Ilona Sorrel M.D.   On: 07/16/2017 16:28   Dg Chest 2 View  Result Date: 07/13/2017 CLINICAL DATA:  57 year old male with dyspnea and chest pain. EXAM: CHEST - 2 VIEW COMPARISON:  Chest radiograph dated 07/12/2017 FINDINGS: Right-sided Port-A-Cath with tip in stable position. There is severe emphysematous changes of the lungs. Faint nodular density in the left upper lobe better seen on the prior CT. There is no pneumothorax. Small stable bilateral pleural effusions, right greater left. The  cardiac silhouette is within normal limits. No acute osseous pathology. IMPRESSION: 1. No acute cardiopulmonary process. 2. Severe emphysema and stable bilateral pleural effusions with Electronically Signed   By: Anner Crete M.D.   On: 07/13/2017 21:53   Dg Chest 2 View  Result Date: 07/11/2017 CLINICAL DATA:  Shortness of breath.  History of lung cancer. EXAM: CHEST - 2 VIEW COMPARISON:  Chest 07/05/2017, CT chest 06/30/2017 FINDINGS: Heart size and pulmonary vascularity are normal. Focal scarring in the left suprahilar and left upper lung region corresponding to lesion better seen at previous CT chest. Small bilateral pleural effusions demonstrating mild increased since previous study. There is developing right perihilar and basilar infiltration which could represent asymmetrical edema or pneumonia. No pneumothorax. Power port type central venous catheter with tip over the cavoatrial junction region. IMPRESSION: Small but increasing bilateral pleural effusions. Increasing infiltration or edema in the right perihilar and basilar lung regions. Focal scarring in the left upper lung is identified, corresponding to changes better seen on previous CT scan. Electronically Signed   By: Lucienne Capers M.D.   On: 07/11/2017 00:30   Ct Angio Chest Pe W/cm &/or Wo  Cm  Result Date: 07/29/2017 CLINICAL DATA:  Chronic dyspnea EXAM: CT ANGIOGRAPHY CHEST WITH CONTRAST TECHNIQUE: Multidetector CT imaging of the chest was performed using the standard protocol during bolus administration of intravenous contrast. Multiplanar CT image reconstructions and MIPs were obtained to evaluate the vascular anatomy. CONTRAST:  134mL ISOVUE-370 IOPAMIDOL (ISOVUE-370) INJECTION 76% COMPARISON:  Same day CXR, chest CT 06/30/2017 and 04/13/2017 FINDINGS: Cardiovascular: Partial dissolution of intraluminal nonocclusive pulmonary embolus at the bifurcation of the left main pulmonary artery since prior exam. No new pulmonary embolus is noted. Mediastinum/Nodes: Patent trachea and mainstem bronchi. No mediastinal or hilar lymphadenopathy. Lungs/Pleura: Stable moderate layering right pleural effusion. Centrilobular emphysema is noted, upper lobe predominant with spiculation in the left upper lobe extending to the left suprahilar region of the chest as before, largely unchanged in appearance since recent comparison and regressed since 04/13/2017 CT. There is compressive atelectasis at the right lung base secondary to the effusion. Upper Abdomen: No acute abnormality. Musculoskeletal: Relative heterogeneous sclerosis of the T12 vertebral body is unchanged. Review of the MIP images confirms the above findings. IMPRESSION: 1. Near complete dissolution of previously noted nonocclusive pulmonary embolus within the distal left main pulmonary artery. No new pulmonary embolus identified. 2. Moderate right pleural effusion with adjacent compressive atelectasis superimposed on COPD. Findings are similar to that of prior. 3. Redemonstration of spiculated opacity in the left upper lobe tracking to the left hilum, unchanged since prior and slightly smaller than on more remote study from March. Emphysema (ICD10-J43.9). Electronically Signed   By: Ashley Royalty M.D.   On: 07/29/2017 21:08   Ct Abdomen Pelvis W  Contrast  Result Date: 07/16/2017 CLINICAL DATA:  Shortness of breath and dizziness. Diarrhea and nausea. EXAM: CT ABDOMEN AND PELVIS WITH CONTRAST TECHNIQUE: Multidetector CT imaging of the abdomen and pelvis was performed using the standard protocol following bolus administration of intravenous contrast. CONTRAST:  68mL ISOVUE-300 IOPAMIDOL (ISOVUE-300) INJECTION 61% COMPARISON:  CT abdomen pelvis 07/03/2017 FINDINGS: LOWER CHEST: Small pleural effusions, right greater than left. Bibasilar atelectasis. HEPATOBILIARY: Focal fat deposition along the course of the left portal vein. No focal liver lesion otherwise. No biliary dilatation. Gallbladder is partially contracted. PANCREAS: Normal parenchymal contours without ductal dilatation. No peripancreatic fluid collection. SPLEEN: Normal. ADRENALS/URINARY TRACT: --Adrenal glands: Normal. --Right kidney/ureter: No hydronephrosis, nephroureterolithiasis, perinephric stranding or  solid renal mass. --Left kidney/ureter: No hydronephrosis, nephroureterolithiasis, perinephric stranding or solid renal mass. --Urinary bladder: Normal for degree of distention STOMACH/BOWEL: --Stomach/Duodenum: Persistent esophageal wall thickening near the gastroesophageal junction. --Small bowel: No dilatation or inflammation. --Colon: No focal abnormality. --Appendix: Not visualized. No right lower quadrant inflammation or free fluid. VASCULAR/LYMPHATIC: Atherosclerotic calcification is present within the non-aneurysmal abdominal aorta, without hemodynamically significant stenosis. The portal vein, splenic vein, superior mesenteric vein and IVC are patent. No abdominal or pelvic lymphadenopathy. REPRODUCTIVE: Normal prostate and seminal vesicles. MUSCULOSKELETAL. Unchanged lucent lesion of the left L3 vertebral body. T12 vertebral body remains sclerotic. OTHER: None. IMPRESSION: 1. No acute abnormality of the abdomen or pelvis. 2. Small pleural effusions, right larger than left but the  right effusion is decreased in size from 07/03/2017. 3.  Aortic Atherosclerosis (ICD10-I70.0). 4. Unchanged sclerotic appearance of T12 and lucent lesion of the inferior endplate of L3. 5. Persistent wall thickening at the gastroesophageal junction. Electronically Signed   By: Ulyses Jarred M.D.   On: 07/16/2017 17:32   US Abdomen Limited  Result Date: 07/31/2017 CLINICAL DATA:  57 year old male with abdominal distension. Assess for ascites. EXAM: LIMITED ABDOMEN ULTRASOUND FOR ASCITES TECHNIQUE: Limited ultrasound survey for ascites was performed in all four abdominal quadrants. COMPARISON:  Prior abdominal ultrasound 07/27/2017 FINDINGS: Sonographic evaluation of the 4 quadrants of the abdomen demonstrates trace ascites. There is insufficient fluid for paracentesis. IMPRESSION: Trace ascites, insufficient for paracentesis. Electronically Signed   By: Jacqulynn Cadet M.D.   On: 07/31/2017 11:56   US Abdomen Limited  Result Date: 07/27/2017 CLINICAL DATA:  Abdominal distension for 10 months. EXAM: ULTRASOUND ABDOMEN LIMITED COMPARISON:  None.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

## 2017-08-09 NOTE — Progress Notes (Signed)
Pt discharged- plan to return to his [facility] - [phone].  CSW faxed DC information but has been unable to reach facility as of yet to ensure ALF prepared for pt's return today. Will continue calling to coordinate DC.  Sharren Bridge, MSW, LCSW Clinical Social Work 08/09/2017 [phone] weekend coverage

## 2017-08-09 NOTE — Progress Notes (Signed)
Daily Progress Note   Patient Name: [name]       Date: 08/09/2017 DOB: 07/01/1960  Age: 57 y.o. MRN#: [id number] Attending Physician: Lavina Hamman, MD Primary Care Physician: [name], MD Admit Date: 08/04/2017  Reason for Consultation/Follow-up: Establishing goals of care and Non pain symptom management  Subjective: I met today with [name].  He was much more receptive to talking today but remains evasive when attempting to discuss long term goals.  He reports discussing with Dr. Posey Pronto earlier as well and being frustrated by conversation.    He reports understanding that he has cancer that is not going to get better, and when I asked if he thought that continuing chemotherapy is been beneficial he said "I am just not sure."  I attempted to discuss with him what is his goal with current therapies including what he is hoping by continuing with chemotherapy.  He then became much more withdrawn and stated that this is something he feels he needs to discuss with Dr. Earlie Server rather than with me.  We then turned focus to symptoms and he reports continuing to feel short of breath throughout the day.  We talked about prior regimen of oxycodone that he was on when I saw him last several months ago.  He states that he thinks the oxycodone is more beneficial for morphine for shortness of breath.  Length of Stay: 5  Current Medications: Scheduled Meds:  . albuterol  2.5 mg Nebulization QID  . apixaban  5 mg Oral BID  . chlorpheniramine-HYDROcodone  5 mL Oral Q12H  . dextromethorphan-guaiFENesin  1 tablet Oral BID  . escitalopram  5 mg Oral QHS  . feeding supplement (ENSURE ENLIVE)  237 mL Oral TID BM  . ferrous sulfate  325 mg Oral BID WC  . fluticasone furoate-vilanterol  1  puff Inhalation Daily  . folic acid  1 mg Oral Daily  . furosemide  40 mg Oral BID  . gabapentin  100 mg Oral TID  . loratadine  10 mg Oral Daily  . metoprolol tartrate  12.5 mg Oral BID  . multivitamin with minerals  1 tablet Oral Daily  . oxyCODONE  5 mg Oral Q6H  . pantoprazole  40 mg Oral BID  . polyethylene glycol  17 g Oral Daily  .  potassium chloride  20 mEq Oral Daily  . predniSONE  50 mg Oral Q breakfast  . senna-docusate  1 tablet Oral BID  . sodium chloride flush  3 mL Intravenous Q12H  . umeclidinium bromide  1 puff Inhalation Daily    Continuous Infusions: . sodium chloride      PRN Meds: sodium chloride, acetaminophen **OR** acetaminophen, albuterol, clonazePAM, hydrOXYzine, ondansetron, oxyCODONE, sodium chloride flush, sodium chloride flush  Physical Exam         Constitutional: He is oriented to person, place, and time. He appears well-developed. He has a sickly appearance.  Thin, frail   HENT:  Head: Normocephalic and atraumatic.  Cardiovascular: Normal rate.  Pulmonary/Chest: Effort normal. No accessory muscle usage. No tachypnea. No respiratory distress.  Neurological: He is alert and oriented to person, place, and time.  Nursing note and vitals reviewed.   Vital Signs: BP (!) 152/92 (BP Location: Left Arm)   Pulse (!) 58   Temp 98.2 F (36.8 C) (Oral)   Resp 16   Ht 5' 11"  (1.803 m)   Wt 53.1 kg (117 lb 1 oz)   SpO2 97%   BMI 16.33 kg/m  SpO2: SpO2: 97 % O2 Device: O2 Device: Nasal Cannula O2 Flow Rate: O2 Flow Rate (L/min): 3 L/min  Intake/output summary:   Intake/Output Summary (Last 24 hours) at 08/09/2017 7793 Last data filed at 08/09/2017 9030 Gross per 24 hour  Intake 1143 ml  Output 3925 ml  Net -2782 ml   LBM: Last BM Date: 08/06/17 Baseline Weight: Weight: 54.4 kg (120 lb) Most recent weight: Weight: 53.1 kg (117 lb 1 oz)       Palliative Assessment/Data:    Flowsheet Rows     Most Recent Value  Intake Tab  Referral  Department  Hospitalist  Unit at Time of Referral  Oncology Unit  Palliative Care Primary Diagnosis  Cancer  Date Notified  08/06/17  Reason for referral  Clarify Goals of Care, Non-pain Symptom  Date of Admission  08/04/17  Date first seen by Palliative Care  08/07/17  # of days Palliative referral response time  1 Day(s)  # of days IP prior to Palliative referral  2  Clinical Assessment  Palliative Performance Scale Score  40%  Psychosocial & Spiritual Assessment  Palliative Care Outcomes  Patient/Family meeting held?  No      Patient Active Problem List   Diagnosis Date Noted  . Protein-calorie malnutrition, severe 07/31/2017  . Thrombocytopenia ([place]) 07/29/2017  . Wheezing on exhalation 07/29/2017  . Anemia 07/24/2017  . Normocytic anemia 07/24/2017  . Chronic diastolic (congestive) heart failure ([place]) 07/24/2017  . Recurrent pleural effusion on right 07/15/2017  . Acute on chronic respiratory failure ([place]) 07/11/2017  . History of pulmonary embolism 07/11/2017  . PE (pulmonary thromboembolism) ([place]) 06/30/2017  . Pulmonary embolism ([place]) 06/30/2017  . AKI (acute kidney injury) ([place]) 06/25/2017  . Dyspnea 06/21/2017  . Chronic combined systolic (congestive) and diastolic (congestive) heart failure ([place])   . Constipation 06/07/2017  . Pain   . Acute on chronic respiratory failure with hypoxia ([place]) 05/28/2017  . Chronic respiratory failure with hypoxia ([place]) 04/01/2017  . Leucocytosis 03/16/2017  . CKD (chronic kidney disease), stage III ([place]) 03/12/2017  . Malnutrition of moderate degree 03/10/2017  . COPD exacerbation ([place]) 03/09/2017  . Macrocytic anemia 02/07/2017  . Severe malnutrition ([place]) 01/03/2017  . DOE (dyspnea on exertion) 12/30/2016  . Nausea   . Hypervolemia   . HCAP (healthcare-associated pneumonia)  12/19/2016  . SOB (shortness of breath) 12/19/2016  . Erosive gastropathy 12/18/2016  . Gastritis 12/18/2016  . Hematemesis 12/16/2016  . Intractable  nausea and vomiting 12/16/2016  . HTN (hypertension) 10/30/2016  . Chronic obstructive pulmonary disease ([place]) 09/02/2016  . COPD (chronic obstructive pulmonary disease) ([place]) 08/19/2016  . Dehydration 06/04/2016  . Abdominal pain 06/04/2016  . 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 Adenocarcinoma of left lung, stage 4 ([place]) 05/02/2016  . Goals of care, counseling/discussion 05/02/2016  . Encounter for antineoplastic chemotherapy 05/02/2016  . Tobacco abuse 05/30/2011  . Alcohol abuse 05/30/2011  . Seizure ([place]) 05/28/2011    Palliative Care Assessment & Plan   Patient Profile: 57 y.o. male  with past medical history of stage IV lung cancer, COPD, HTN, CHF, numerous admissions (14 admissions and 16 ED visits in past 6 months) admitted on 08/04/2017 with worsening SOB.  He has been undergoing maintenance treatment with [facility] every 3 weeks.  Palliative consulted for goals of care and symptom management.   Assessment: Patient Active Problem List   Diagnosis Date Noted  . Protein-calorie malnutrition, severe 07/31/2017  . Thrombocytopenia ([place]) 07/29/2017  . Wheezing on exhalation 07/29/2017  . Anemia 07/24/2017  . Normocytic anemia 07/24/2017  . Chronic diastolic (congestive) heart failure ([place]) 07/24/2017  . Recurrent pleural effusion on right 07/15/2017  . Acute on chronic respiratory failure ([place]) 07/11/2017  . History of pulmonary embolism 07/11/2017  . PE (pulmonary thromboembolism) ([place]) 06/30/2017  . Pulmonary embolism ([place]) 06/30/2017  . AKI (acute kidney injury) ([place]) 06/25/2017  . Dyspnea 06/21/2017  . Chronic combined systolic (congestive) and diastolic (congestive) heart failure ([place])   . Constipation 06/07/2017  . Pain   . Acute on chronic respiratory failure with hypoxia ([place]) 05/28/2017  . Chronic respiratory failure with hypoxia ([place]) 04/01/2017  . Leucocytosis 03/16/2017  . CKD (chronic kidney disease), stage III ([place]) 03/12/2017  . Malnutrition of moderate  degree 03/10/2017  . COPD exacerbation ([place]) 03/09/2017  . Macrocytic anemia 02/07/2017  . Severe malnutrition ([place]) 01/03/2017  . DOE (dyspnea on exertion) 12/30/2016  . Nausea   . Hypervolemia   . HCAP (healthcare-associated pneumonia) 12/19/2016  . SOB (shortness of breath) 12/19/2016  . Erosive gastropathy 12/18/2016  . Gastritis 12/18/2016  . Hematemesis 12/16/2016  . Intractable nausea and vomiting 12/16/2016  . HTN (hypertension) 10/30/2016  . Chronic obstructive pulmonary disease ([place]) 09/02/2016  . COPD (chronic obstructive pulmonary disease) ([place]) 08/19/2016  . Dehydration 06/04/2016  . Abdominal pain 06/04/2016  . 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 Adenocarcinoma of left lung, stage 4 ([place]) 05/02/2016  . Goals of care, counseling/discussion 05/02/2016  . Encounter for antineoplastic chemotherapy 05/02/2016  . Tobacco abuse 05/30/2011  . Alcohol abuse 05/30/2011  . Seizure ([place]) 05/28/2011   Recommendations/Plan:  Shortness of breath: Plan for oxycodone 5 mg every 6 hours scheduled.  We will also plan for additional 5 mg of oxycodone every 3 hours as needed.  We discussed potential for initiation of long-acting medication if he feels better getting scheduled medications around-the-clock.  Goals of Care and Additional Recommendations:  Limitations on Scope of Treatment: DNR/DNI but otherwise full scope treatment.  He remains resistant to discuss his long-term expectations/goals.  Code Status:    Code Status Orders  (From admission, onward)        Start     Ordered   08/04/17 2029  Do not attempt resuscitation (DNR)  Continuous    Question Answer Comment  In the event of cardiac  or respiratory ARREST Do not call a "code blue"   In the event of cardiac or respiratory ARREST Do not perform Intubation, CPR, defibrillation or ACLS   In the event of cardiac or respiratory ARREST Use medication by any route, position, wound care, and other measures to relive pain  and suffering. May use oxygen, suction and manual treatment of airway obstruction as needed for comfort.      08/04/17 2028    Code Status History    Date Active Date Inactive Code Status Order ID Comments User Context   07/29/2017 2348 07/31/2017 1910 DNR [id number]  Toy Baker, MD Inpatient   07/27/2017 0701 07/28/2017 1839 DNR [id number]  Jani Gravel, MD ED   07/24/2017 0150 07/25/2017 1703 DNR [id number]  Ivor Costa, MD ED   07/11/2017 0304 07/13/2017 0224 DNR [id number]  Norval Morton, MD ED   07/04/2017 2356 07/09/2017 2151 DNR [id number]  Etta Quill, DO ED   06/30/2017 1015 07/04/2017 0215 DNR [id number]  Shelly Coss, MD ED   06/25/2017 1117 06/29/2017 1603 Full Code [id number]  Shelly Coss, MD ED   06/21/2017 1044 06/23/2017 2044 DNR [id number]  Mariel Aloe, MD ED   06/05/2017 1602 06/10/2017 2253 Full Code [id number]  Cristy Folks, MD Inpatient   05/28/2017 1147 05/31/2017 2012 DNR [id number]  Nita Sells, MD ED   04/10/2017 1148 04/17/2017 1837 Full Code [id number]  Damita Lack, MD ED   03/09/2017 2359 03/19/2017 1931 Full Code [id number]  Cristy Folks, MD Inpatient   02/14/2017 0902 02/16/2017 1455 Full Code [id number]  Florencia Reasons, MD Inpatient   02/10/2017 0031 02/14/2017 0902 DNR [id number]  Norval Morton, MD ED   12/30/2016 2325 01/07/2017 2109 DNR [id number]  Elodia [name]., MD Inpatient   12/17/2016 0934 12/24/2016 1811 DNR [id number]  Thurnell Lose, MD Inpatient   12/16/2016 1649 12/17/2016 0934 Full Code [id number]  Charlynne Cousins, MD Inpatient    Advance Directive Documentation     Most Recent Value  Type of Advance Directive  Out of facility DNR (pink MOST or yellow form) [paperwork from nursing home states pt is a dnr]  Pre-existing out of facility DNR order (yellow form or pink MOST form)  -  "MOST" Form in Place?  -       Prognosis:   Unable to determine- if his goal becomes to focus on symptom management, I do think his prognosis without  further disease modifying therapy is certainly less than 6 months and he ought to qualify for hospice services at any point moving forward if so desired.  Discharge Planning:  To Be Determined  Care plan was discussed with Dr. Posey Pronto and bedside RN  Thank you for allowing the Palliative Medicine Team to assist in the care of this patient.   Total Time 55 Prolonged Time Billed No      Greater than 50%  of this time was spent counseling and coordinating care related to the above assessment and plan.  Micheline Rough, MD  Please contact Palliative Medicine Team phone at [phone] for questions and concerns.

## 2017-08-10 ENCOUNTER — Emergency Department (HOSPITAL_COMMUNITY)
Admission: EM | Admit: 2017-08-10 | Discharge: 2017-08-11 | Disposition: A | Discharge status: Home / Self Care | Payer: Medicaid Other | Attending: Emergency Medicine | Admitting: Emergency Medicine

## 2017-08-10 ENCOUNTER — Emergency Department (HOSPITAL_COMMUNITY): Payer: Medicaid Other

## 2017-08-10 ENCOUNTER — Encounter (HOSPITAL_COMMUNITY): Payer: Self-pay | Admitting: Nurse Practitioner

## 2017-08-10 DIAGNOSIS — I130 Hypertensive heart and chronic kidney disease with heart failure and stage 1 through stage 4 chronic kidney disease, or unspecified chronic kidney disease: Secondary | ICD-10-CM | POA: Insufficient documentation

## 2017-08-10 DIAGNOSIS — R109 Unspecified abdominal pain: Secondary | ICD-10-CM | POA: Diagnosis not present

## 2017-08-10 DIAGNOSIS — F17210 Nicotine dependence, cigarettes, uncomplicated: Secondary | ICD-10-CM | POA: Insufficient documentation

## 2017-08-10 DIAGNOSIS — I5032 Chronic diastolic (congestive) heart failure: Secondary | ICD-10-CM | POA: Insufficient documentation

## 2017-08-10 DIAGNOSIS — Z86711 Personal history of pulmonary embolism: Secondary | ICD-10-CM | POA: Insufficient documentation

## 2017-08-10 DIAGNOSIS — R600 Localized edema: Secondary | ICD-10-CM | POA: Insufficient documentation

## 2017-08-10 DIAGNOSIS — Z79899 Other long term (current) drug therapy: Secondary | ICD-10-CM | POA: Insufficient documentation

## 2017-08-10 DIAGNOSIS — J441 Chronic obstructive pulmonary disease with (acute) exacerbation: Secondary | ICD-10-CM | POA: Insufficient documentation

## 2017-08-10 DIAGNOSIS — N183 Chronic kidney disease, stage 3 (moderate): Secondary | ICD-10-CM | POA: Diagnosis not present

## 2017-08-10 DIAGNOSIS — Z66 Do not resuscitate: Secondary | ICD-10-CM | POA: Diagnosis not present

## 2017-08-10 DIAGNOSIS — R0602 Shortness of breath: Secondary | ICD-10-CM | POA: Diagnosis present

## 2017-08-10 DIAGNOSIS — C3492 Malignant neoplasm of unspecified part of left bronchus or lung: Secondary | ICD-10-CM | POA: Diagnosis not present

## 2017-08-10 DIAGNOSIS — G8929 Other chronic pain: Secondary | ICD-10-CM

## 2017-08-10 DIAGNOSIS — J449 Chronic obstructive pulmonary disease, unspecified: Secondary | ICD-10-CM

## 2017-08-10 DIAGNOSIS — Z7901 Long term (current) use of anticoagulants: Secondary | ICD-10-CM | POA: Diagnosis not present

## 2017-08-10 LAB — COMPREHENSIVE METABOLIC PANEL
ALBUMIN: 3.4 g/dL — AB (ref 3.5–5.0)
ALK PHOS: 101 U/L (ref 38–126)
ALT: 21 U/L (ref 0–44)
ANION GAP: 11 (ref 5–15)
AST: 22 U/L (ref 15–41)
BUN: 28 mg/dL — ABNORMAL HIGH (ref 6–20)
CALCIUM: 9.3 mg/dL (ref 8.9–10.3)
CO2: 37 mmol/L — AB (ref 22–32)
Chloride: 94 mmol/L — ABNORMAL LOW (ref 98–111)
Creatinine, Ser: 1.6 mg/dL — ABNORMAL HIGH (ref 0.61–1.24)
GFR calc Af Amer: 53 mL/min — ABNORMAL LOW (ref >60)
GFR calc non Af Amer: 46 mL/min — ABNORMAL LOW (ref >60)
GLUCOSE: 102 mg/dL — AB (ref 70–99)
Potassium: 3.4 mmol/L — ABNORMAL LOW (ref 3.5–5.1)
Sodium: 142 mmol/L (ref 135–145)
Total Bilirubin: 0.4 mg/dL (ref 0.3–1.2)
Total Protein: 6.4 g/dL — ABNORMAL LOW (ref 6.5–8.1)

## 2017-08-10 LAB — CBC WITH DIFFERENTIAL/PLATELET
Basophils Absolute: 0 10*3/uL (ref 0.0–0.1)
Basophils Relative: 0 %
EOS PCT: 0 %
Eosinophils Absolute: 0.1 10*3/uL (ref 0.0–0.7)
HEMATOCRIT: 28.9 % — AB (ref 39.0–52.0)
Hemoglobin: 9.2 g/dL — ABNORMAL LOW (ref 13.0–17.0)
LYMPHS ABS: 1.1 10*3/uL (ref 0.7–4.0)
LYMPHS PCT: 7 %
MCH: 33.2 pg (ref 26.0–34.0)
MCHC: 31.8 g/dL (ref 30.0–36.0)
MCV: 104.3 fL — AB (ref 78.0–100.0)
MONO ABS: 0.4 10*3/uL (ref 0.1–1.0)
MONOS PCT: 3 %
NEUTROS ABS: 13.2 10*3/uL — AB (ref 1.7–7.7)
Neutrophils Relative %: 90 %
PLATELETS: 189 10*3/uL (ref 150–400)
RBC: 2.77 MIL/uL — ABNORMAL LOW (ref 4.22–5.81)
RDW: 19.3 % — AB (ref 11.5–15.5)
WBC: 14.7 10*3/uL — ABNORMAL HIGH (ref 4.0–10.5)

## 2017-08-10 LAB — I-STAT TROPONIN, ED: Troponin i, poc: 0.01 ng/mL (ref 0.00–0.08)

## 2017-08-10 MED ORDER — FLUTICASONE-UMECLIDIN-VILANT 100-62.5-25 MCG/INH IN AEPB: 1.0000 | INHALATION_SPRAY | Freq: Every day | RESPIRATORY_TRACT | Status: DC

## 2017-08-10 MED ORDER — PANTOPRAZOLE SODIUM 40 MG PO TBEC
40.0000 mg | DELAYED_RELEASE_TABLET | Freq: Two times a day (BID) | ORAL | Status: DC
  Administered 2017-08-10: 40 mg via ORAL
  Filled 2017-08-10: qty 1

## 2017-08-10 MED ORDER — METHYLPREDNISOLONE SODIUM SUCC 125 MG IJ SOLR: 125.0000 mg | Freq: Once | INTRAMUSCULAR | Status: DC

## 2017-08-10 MED ORDER — IPRATROPIUM BROMIDE 0.02 % IN SOLN
0.5000 mg | Freq: Once | RESPIRATORY_TRACT | Status: AC
  Administered 2017-08-10: 0.5 mg via RESPIRATORY_TRACT
  Filled 2017-08-10: qty 2.5

## 2017-08-10 MED ORDER — FUROSEMIDE 40 MG PO TABS: 40.0000 mg | ORAL_TABLET | Freq: Every day | ORAL | Status: DC

## 2017-08-10 MED ORDER — GABAPENTIN 100 MG PO CAPS
100.0000 mg | ORAL_CAPSULE | Freq: Three times a day (TID) | ORAL | Status: DC
  Administered 2017-08-10: 100 mg via ORAL
  Filled 2017-08-10: qty 1

## 2017-08-10 MED ORDER — ALBUTEROL SULFATE (2.5 MG/3ML) 0.083% IN NEBU
5.0000 mg | INHALATION_SOLUTION | Freq: Once | RESPIRATORY_TRACT | Status: AC
  Administered 2017-08-10: 5 mg via RESPIRATORY_TRACT
  Filled 2017-08-10: qty 6

## 2017-08-10 MED ORDER — POTASSIUM CHLORIDE CRYS ER 20 MEQ PO TBCR
40.0000 meq | EXTENDED_RELEASE_TABLET | Freq: Once | ORAL | Status: AC
  Administered 2017-08-10: 40 meq via ORAL
  Filled 2017-08-10: qty 2

## 2017-08-10 MED ORDER — METHYLPREDNISOLONE SODIUM SUCC 125 MG IJ SOLR
125.0000 mg | Freq: Once | INTRAMUSCULAR | Status: AC
  Administered 2017-08-10: 125 mg via INTRAVENOUS
  Filled 2017-08-10: qty 2

## 2017-08-10 MED ORDER — ESCITALOPRAM OXALATE 10 MG PO TABS
5.0000 mg | ORAL_TABLET | Freq: Every day | ORAL | Status: DC
  Administered 2017-08-10: 5 mg via ORAL
  Filled 2017-08-10: qty 1

## 2017-08-10 MED ORDER — ALBUTEROL SULFATE (2.5 MG/3ML) 0.083% IN NEBU
2.5000 mg | INHALATION_SOLUTION | Freq: Four times a day (QID) | RESPIRATORY_TRACT | Status: DC | PRN
Start: 2017-08-10 — End: 2017-08-11
  Administered 2017-08-11: 2.5 mg via RESPIRATORY_TRACT
  Filled 2017-08-10: qty 3

## 2017-08-10 MED ORDER — POTASSIUM CHLORIDE ER 10 MEQ PO TBCR: 20.0000 meq | EXTENDED_RELEASE_TABLET | Freq: Every day | ORAL | Status: DC

## 2017-08-10 MED ORDER — GUAIFENESIN-DM 100-10 MG/5ML PO SYRP
5.0000 mL | ORAL_SOLUTION | Freq: Two times a day (BID) | ORAL | Status: DC
  Administered 2017-08-10: 5 mL via ORAL
  Filled 2017-08-10: qty 10

## 2017-08-10 MED ORDER — ACETAMINOPHEN 500 MG PO TABS: 500.0000 mg | ORAL_TABLET | Freq: Two times a day (BID) | ORAL | Status: DC | PRN

## 2017-08-10 MED ORDER — MAGNESIUM SULFATE 2 GM/50ML IV SOLN
2.0000 g | Freq: Once | INTRAVENOUS | Status: AC
  Administered 2017-08-10: 2 g via INTRAVENOUS
  Filled 2017-08-10: qty 50

## 2017-08-10 MED ORDER — METOPROLOL TARTRATE 25 MG PO TABS
12.5000 mg | ORAL_TABLET | Freq: Two times a day (BID) | ORAL | Status: DC
  Administered 2017-08-10: 12.5 mg via ORAL
  Filled 2017-08-10: qty 1

## 2017-08-10 MED ORDER — APIXABAN 5 MG PO TABS
5.0000 mg | ORAL_TABLET | Freq: Two times a day (BID) | ORAL | Status: DC
  Administered 2017-08-10: 5 mg via ORAL
  Filled 2017-08-10 (×2): qty 1

## 2017-08-10 MED ORDER — IPRATROPIUM-ALBUTEROL 0.5-2.5 (3) MG/3ML IN SOLN
3.0000 mL | Freq: Once | RESPIRATORY_TRACT | Status: AC
  Administered 2017-08-10: 3 mL via RESPIRATORY_TRACT
  Filled 2017-08-10: qty 3

## 2017-08-10 MED ORDER — FOLIC ACID 1 MG PO TABS: 1.0000 mg | ORAL_TABLET | Freq: Every day | ORAL | Status: DC

## 2017-08-10 MED ORDER — GUAIFENESIN ER 600 MG PO TB12
1200.0000 mg | ORAL_TABLET | Freq: Two times a day (BID) | ORAL | Status: DC
  Administered 2017-08-10: 1200 mg via ORAL
  Filled 2017-08-10: qty 2

## 2017-08-10 MED ORDER — OXYCODONE-ACETAMINOPHEN 5-325 MG PO TABS
2.0000 | ORAL_TABLET | Freq: Once | ORAL | Status: AC
  Administered 2017-08-10: 2 via ORAL
  Filled 2017-08-10: qty 2

## 2017-08-10 MED ORDER — SENNOSIDES-DOCUSATE SODIUM 8.6-50 MG PO TABS
2.0000 | ORAL_TABLET | Freq: Two times a day (BID) | ORAL | Status: DC
  Administered 2017-08-10: 2 via ORAL
  Filled 2017-08-10: qty 2

## 2017-08-10 MED ORDER — SENNA 8.6 MG PO TABS
1.0000 | ORAL_TABLET | Freq: Two times a day (BID) | ORAL | Status: DC
  Administered 2017-08-10: 8.6 mg via ORAL
  Filled 2017-08-10: qty 1

## 2017-08-10 MED ORDER — HYDROXYZINE HCL 10 MG PO TABS: 10.0000 mg | ORAL_TABLET | Freq: Three times a day (TID) | ORAL | Status: DC | PRN

## 2017-08-10 MED ORDER — SUCRALFATE 1 GM/10ML PO SUSP
1.0000 g | Freq: Three times a day (TID) | ORAL | Status: DC
  Administered 2017-08-11: 1 g via ORAL
  Filled 2017-08-10: qty 10

## 2017-08-10 MED ORDER — FERROUS SULFATE 325 (65 FE) MG PO TABS
325.0000 mg | ORAL_TABLET | Freq: Two times a day (BID) | ORAL | Status: DC
  Filled 2017-08-10: qty 1

## 2017-08-10 MED ORDER — OXYCODONE-ACETAMINOPHEN 5-325 MG PO TABS
1.0000 | ORAL_TABLET | ORAL | Status: DC | PRN
  Administered 2017-08-11: 1 via ORAL
  Filled 2017-08-10 (×2): qty 1

## 2017-08-10 MED ORDER — LORATADINE 10 MG PO TABS: 10.0000 mg | ORAL_TABLET | Freq: Every day | ORAL | Status: DC

## 2017-08-10 MED ORDER — ONDANSETRON 4 MG PO TBDP: 4.0000 mg | ORAL_TABLET | Freq: Three times a day (TID) | ORAL | Status: DC | PRN

## 2017-08-10 NOTE — ED Notes (Signed)
Bed: WA21 Expected date:  Expected time:  Means of arrival:  Comments: SOB
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
## 2017-08-11 ENCOUNTER — Non-Acute Institutional Stay: Payer: Self-pay | Admitting: Internal Medicine

## 2017-08-11 MED ORDER — ALBUTEROL SULFATE (2.5 MG/3ML) 0.083% IN NEBU: 5.0000 mg | INHALATION_SOLUTION | Freq: Once | RESPIRATORY_TRACT | Status: DC

## 2017-08-11 MED ORDER — IPRATROPIUM BROMIDE 0.02 % IN SOLN: 0.5000 mg | Freq: Once | RESPIRATORY_TRACT | Status: DC

## 2017-08-11 NOTE — Progress Notes (Signed)
CSW spoke with patient at bedside regarding disposition. Patient stated he's currently living at [facility]. Patient stated that he does not like living at the ALF but that he does not feel like the facility is neglecting his needs. Patient does not want to be here at the hospital and is agreeable to return to facility.  Patient to return to his ALF [facility] [phone].  Ollen Barges, [place] Work Department  [employer]  [phone]

## 2017-08-11 NOTE — Discharge Instructions (Addendum)
It was our pleasure to provide your ER care today - we hope that you feel better.  Follow up with primary care doctor in the coming week.  Return to your local ER if worse, new symptoms, fevers, increased trouble breathing, other concern.

## 2017-08-11 NOTE — ED Notes (Signed)
[facility] has been called for transport. 

## 2017-08-11 NOTE — ED Provider Notes (Signed)
SW has evaluated and states pt can return to his ECF.   Mild wheezing, albuterol neb.   Pt given breakfast.   Pt comfortable, nad.   Pt currently appears stable for d/c.      Lajean Saver, MD 08/11/17 [phone]

## 2017-08-11 NOTE — ED Notes (Signed)
ED Provider at bedside. 

## 2017-08-12 ENCOUNTER — Encounter (HOSPITAL_COMMUNITY): Payer: Self-pay | Admitting: *Deleted

## 2017-08-12 ENCOUNTER — Emergency Department (HOSPITAL_COMMUNITY): Payer: Medicaid Other

## 2017-08-12 ENCOUNTER — Other Ambulatory Visit: Payer: Self-pay

## 2017-08-12 ENCOUNTER — Emergency Department (HOSPITAL_COMMUNITY)
Admission: EM | Admit: 2017-08-12 | Discharge: 2017-08-12 | Disposition: A | Discharge status: Skilled Nursing Facility | Payer: Medicaid Other | Source: Home / Self Care | Attending: Emergency Medicine | Admitting: Emergency Medicine

## 2017-08-12 ENCOUNTER — Emergency Department (HOSPITAL_COMMUNITY)
Admission: EM | Admit: 2017-08-12 | Discharge: 2017-08-12 | Disposition: A | Discharge status: Home / Self Care | Payer: Medicaid Other | Attending: Emergency Medicine | Admitting: Emergency Medicine

## 2017-08-12 DIAGNOSIS — J441 Chronic obstructive pulmonary disease with (acute) exacerbation: Secondary | ICD-10-CM | POA: Diagnosis not present

## 2017-08-12 DIAGNOSIS — I5042 Chronic combined systolic (congestive) and diastolic (congestive) heart failure: Secondary | ICD-10-CM | POA: Insufficient documentation

## 2017-08-12 DIAGNOSIS — F17210 Nicotine dependence, cigarettes, uncomplicated: Secondary | ICD-10-CM | POA: Diagnosis not present

## 2017-08-12 DIAGNOSIS — Z85118 Personal history of other malignant neoplasm of bronchus and lung: Secondary | ICD-10-CM | POA: Insufficient documentation

## 2017-08-12 DIAGNOSIS — N183 Chronic kidney disease, stage 3 (moderate): Secondary | ICD-10-CM | POA: Insufficient documentation

## 2017-08-12 DIAGNOSIS — Z79899 Other long term (current) drug therapy: Secondary | ICD-10-CM | POA: Insufficient documentation

## 2017-08-12 DIAGNOSIS — R0602 Shortness of breath: Secondary | ICD-10-CM | POA: Diagnosis present

## 2017-08-12 DIAGNOSIS — I130 Hypertensive heart and chronic kidney disease with heart failure and stage 1 through stage 4 chronic kidney disease, or unspecified chronic kidney disease: Secondary | ICD-10-CM | POA: Diagnosis not present

## 2017-08-12 LAB — CBC WITH DIFFERENTIAL/PLATELET
BASOS ABS: 0 10*3/uL (ref 0.0–0.1)
BASOS PCT: 0 %
EOS ABS: 0 10*3/uL (ref 0.0–0.7)
EOS PCT: 0 %
HCT: 24.3 % — ABNORMAL LOW (ref 39.0–52.0)
Hemoglobin: 7.9 g/dL — ABNORMAL LOW (ref 13.0–17.0)
Lymphocytes Relative: 1 %
Lymphs Abs: 0.2 10*3/uL — ABNORMAL LOW (ref 0.7–4.0)
MCH: 33.9 pg (ref 26.0–34.0)
MCHC: 32.5 g/dL (ref 30.0–36.0)
MCV: 104.3 fL — ABNORMAL HIGH (ref 78.0–100.0)
MONO ABS: 0.2 10*3/uL (ref 0.1–1.0)
MONOS PCT: 1 %
NEUTROS ABS: 14.8 10*3/uL — AB (ref 1.7–7.7)
Neutrophils Relative %: 98 %
PLATELETS: 176 10*3/uL (ref 150–400)
RBC: 2.33 MIL/uL — ABNORMAL LOW (ref 4.22–5.81)
RDW: 20.3 % — AB (ref 11.5–15.5)
WBC: 15.2 10*3/uL — ABNORMAL HIGH (ref 4.0–10.5)

## 2017-08-12 LAB — COMPREHENSIVE METABOLIC PANEL
ALBUMIN: 3.3 g/dL — AB (ref 3.5–5.0)
ALK PHOS: 106 U/L (ref 38–126)
ALT: 20 U/L (ref 0–44)
ANION GAP: 14 (ref 5–15)
AST: 24 U/L (ref 15–41)
BUN: 30 mg/dL — AB (ref 6–20)
CALCIUM: 9.2 mg/dL (ref 8.9–10.3)
CO2: 31 mmol/L (ref 22–32)
CREATININE: 1.53 mg/dL — AB (ref 0.61–1.24)
Chloride: 98 mmol/L (ref 98–111)
GFR calc Af Amer: 56 mL/min — ABNORMAL LOW (ref >60)
GFR calc non Af Amer: 48 mL/min — ABNORMAL LOW (ref >60)
GLUCOSE: 151 mg/dL — AB (ref 70–99)
Potassium: 4.5 mmol/L (ref 3.5–5.1)
Sodium: 143 mmol/L (ref 135–145)
Total Bilirubin: 0.8 mg/dL (ref 0.3–1.2)
Total Protein: 6.6 g/dL (ref 6.5–8.1)

## 2017-08-12 LAB — TROPONIN I: Troponin I: 0.03 ng/mL (ref <0.03)

## 2017-08-12 LAB — BRAIN NATRIURETIC PEPTIDE: B NATRIURETIC PEPTIDE 5: 74.5 pg/mL (ref 0.0–100.0)

## 2017-08-12 MED ORDER — ALBUTEROL SULFATE (2.5 MG/3ML) 0.083% IN NEBU
5.0000 mg | INHALATION_SOLUTION | Freq: Once | RESPIRATORY_TRACT | Status: AC
  Administered 2017-08-12: 5 mg via RESPIRATORY_TRACT
  Filled 2017-08-12: qty 6

## 2017-08-12 MED ORDER — OXYCODONE HCL 5 MG PO TABS
10.0000 mg | ORAL_TABLET | Freq: Once | ORAL | Status: AC
  Administered 2017-08-12: 10 mg via ORAL
  Filled 2017-08-12: qty 2

## 2017-08-12 MED ORDER — IPRATROPIUM-ALBUTEROL 0.5-2.5 (3) MG/3ML IN SOLN
3.0000 mL | Freq: Once | RESPIRATORY_TRACT | Status: AC
  Administered 2017-08-12: 3 mL via RESPIRATORY_TRACT
  Filled 2017-08-12: qty 9

## 2017-08-12 MED ORDER — FENTANYL CITRATE (PF) 100 MCG/2ML IJ SOLN
50.0000 ug | Freq: Once | INTRAMUSCULAR | Status: AC
  Administered 2017-08-12: 50 ug via INTRAVENOUS
  Filled 2017-08-12: qty 2

## 2017-08-12 MED ORDER — IPRATROPIUM BROMIDE 0.02 % IN SOLN
0.5000 mg | Freq: Once | RESPIRATORY_TRACT | Status: AC
  Administered 2017-08-12: 0.5 mg via RESPIRATORY_TRACT
  Filled 2017-08-12: qty 2.5

## 2017-08-12 MED ORDER — HEPARIN SOD (PORK) LOCK FLUSH 100 UNIT/ML IV SOLN
500.0000 [IU] | Freq: Once | INTRAVENOUS | Status: AC
  Administered 2017-08-12: 500 [IU]
  Filled 2017-08-12: qty 5

## 2017-08-12 MED ORDER — HYDROCODONE-ACETAMINOPHEN 5-325 MG PO TABS
1.0000 | ORAL_TABLET | Freq: Once | ORAL | Status: AC
  Administered 2017-08-12: 1 via ORAL
  Filled 2017-08-12: qty 1

## 2017-08-12 NOTE — ED Provider Notes (Signed)
[facility] DEPT Provider Note   CSN: [id number] Arrival date & time: 08/12/17  0331     History   Chief Complaint Chief Complaint  Patient presents with  . Shortness of Breath    HPI [name] is a 57 y.o. male.   The history is provided by the patient.   Shortness of Breath   This is a chronic problem. The problem occurs frequently.The problem has been gradually worsening. Associated symptoms include cough, sputum production and wheezing. Pertinent negatives include no fever and no hemoptysis. Associated symptoms comments: Chest tightness Myalgias . Associated medical issues include COPD.  Patient history of COPD on home oxygen, history of lung CA stage IV, presents with shortness of breath.  He reports return of his shortness of breath earlier tonight, EMS was called.  He has been given albuterol, Atrovent, Solu-Medrol and magnesium in route.  He reports still feeling short of breath, chest tightness, and hurting all over due to chemotherapy. He reports this is similar to prior COPD exacerbation  Past Medical History:  Diagnosis Date  . Abdominal pain 06/04/2016  . Adenocarcinoma of left lung, stage 4 ([place]) 05/02/2016  . Alcohol abuse   . Bronchitis due to tobacco use ([place])   . Cancer ([place])    Lung  . COPD (chronic obstructive pulmonary disease) ([place])    on home o2 2LNC   . Dehydration 06/04/2016  . Encounter for antineoplastic chemotherapy 05/02/2016  . Gastritis   . Goals of care, counseling/discussion 05/02/2016  . Hematemesis   . HTN (hypertension) 10/30/2016  . Pulmonary embolism ([place]) 06/30/2017   on CTA chest  . Recurrent upper respiratory infection (URI)   . Seizures ([place]) 05/2011   new onset    Patient Active Problem List   Diagnosis Date Noted  . Protein-calorie malnutrition, severe 07/31/2017  . Thrombocytopenia ([place]) 07/29/2017  . Wheezing on exhalation 07/29/2017  . Anemia 07/24/2017  . Normocytic anemia  07/24/2017  . Chronic diastolic (congestive) heart failure ([place]) 07/24/2017  . Recurrent pleural effusion on right 07/15/2017  . Acute on chronic respiratory failure ([place]) 07/11/2017  . History of pulmonary embolism 07/11/2017  . PE (pulmonary thromboembolism) ([place]) 06/30/2017  . Pulmonary embolism ([place]) 06/30/2017  . AKI (acute kidney injury) ([place]) 06/25/2017  . Dyspnea 06/21/2017  . Chronic combined systolic (congestive) and diastolic (congestive) heart failure ([place])   . Constipation 06/07/2017  . Pain   . Acute on chronic respiratory failure with hypoxia ([place]) 05/28/2017  . Chronic respiratory failure with hypoxia ([place]) 04/01/2017  . Leucocytosis 03/16/2017  . CKD (chronic kidney disease), stage III ([place]) 03/12/2017  . Malnutrition of moderate degree 03/10/2017  . COPD exacerbation ([place]) 03/09/2017  . Macrocytic anemia 02/07/2017  . Severe malnutrition ([place]) 01/03/2017  . DOE (dyspnea on exertion) 12/30/2016  . Nausea   . Hypervolemia   . HCAP (healthcare-associated pneumonia) 12/19/2016  . SOB (shortness of breath) 12/19/2016  . Erosive gastropathy 12/18/2016  . Gastritis 12/18/2016  . Hematemesis 12/16/2016  . Intractable nausea and vomiting 12/16/2016  . HTN (hypertension) 10/30/2016  . Chronic obstructive pulmonary disease ([place]) 09/02/2016  . COPD (chronic obstructive pulmonary disease) ([place]) 08/19/2016  . Dehydration 06/04/2016  . Abdominal pain 06/04/2016  . 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 Adenocarcinoma of left lung, stage 4 ([place]) 05/02/2016  . Goals of care, counseling/discussion 05/02/2016  . Encounter for antineoplastic chemotherapy 05/02/2016  . Tobacco abuse 05/30/2011  . Alcohol abuse 05/30/2011  . Seizure ([place]) 05/28/2011  Past Surgical History:  Procedure Laterality Date  . ESOPHAGOGASTRODUODENOSCOPY (EGD) WITH PROPOFOL N/A 12/17/2016   Procedure: ESOPHAGOGASTRODUODENOSCOPY (EGD) WITH PROPOFOL;  Surgeon: Ladene Artist, MD;  Location: [facility]  ENDOSCOPY;  Service: Endoscopy;  Laterality: N/A;  . IR FLUORO GUIDE PORT INSERTION RIGHT  05/13/2016  . IR US GUIDE VASC ACCESS RIGHT  05/13/2016  . NO PAST SURGERIES    . THORACENTESIS  07/05/2017   ultrasound guided        Home Medications    Prior to Admission medications   Medication Sig Start Date End Date Taking? Authorizing Provider  acetaminophen (TYLENOL) 500 MG tablet Take 1 tablet (500 mg total) by mouth 2 (two) times daily as needed for mild pain. 07/07/17   Florencia Reasons, MD  albuterol (PROVENTIL) (2.5 MG/3ML) 0.083% nebulizer solution 1 neb every 4-6 hours as needed for wheezing and shortness of breath Patient taking differently: Take 2.5 mg by nebulization every 6 (six) hours as needed for wheezing or shortness of breath.  04/01/17   Parrett, Fonnie Mu, NP  apixaban (ELIQUIS) 5 MG TABS tablet Take 5 mg by mouth 2 (two) times daily.    [provider]  Chlorphen-Phenyleph-ASA (ALKA-SELTZER PLUS COLD PO) Take 2 tablets by mouth at bedtime as needed (COUGH).    [provider]  Dextromethorphan-guaiFENesin (TUSSIN DM) 10-100 MG/5ML liquid Take 5 mLs by mouth every 12 (twelve) hours.    [provider]  escitalopram (LEXAPRO) 5 MG tablet Take 1 tablet (5 mg total) by mouth at bedtime. 04/17/17   Rosita Fire, MD  feeding supplement, ENSURE ENLIVE, (ENSURE ENLIVE) LIQD Take 237 mLs by mouth 3 (three) times daily between meals. 03/19/17   Shelly Coss, MD  ferrous sulfate 325 (65 FE) MG tablet Take 1 tablet (325 mg total) by mouth 2 (two) times daily with a meal. 07/12/17   Lavina Hamman, MD  Fluticasone-Umeclidin-Vilant (TRELEGY ELLIPTA) 100-62.5-25 MCG/INH AEPB Inhale 1 puff into the lungs daily. 05/31/17   Aline August, MD  folic acid (FOLVITE) 1 MG tablet Take 1 tablet (1 mg total) by mouth daily. 07/13/17   Lavina Hamman, MD  furosemide (LASIX) 40 MG tablet Take 1 tablet (40 mg total) by mouth 2 (two) times daily. Patient taking differently: Take  40 mg by mouth daily.  08/09/17   Lavina Hamman, MD  gabapentin (NEURONTIN) 100 MG capsule Take 1 capsule (100 mg total) by mouth 3 (three) times daily. 07/12/17   Lavina Hamman, MD  guaiFENesin (MUCINEX) 600 MG 12 hr tablet Take 1,200 mg by mouth 2 (two) times daily.     [provider]  hydrOXYzine (ATARAX/VISTARIL) 10 MG tablet Take 1 tablet (10 mg total) by mouth 3 (three) times daily as needed for anxiety. 07/25/17   Donne Hazel, MD  ipratropium-albuterol (DUONEB) 0.5-2.5 (3) MG/3ML SOLN Take 3 mLs by nebulization 4 (four) times daily. 07/28/17   Lavina Hamman, MD  loratadine (CLARITIN) 10 MG tablet Take 10 mg by mouth daily.    [provider]  metoprolol tartrate (LOPRESSOR) 25 MG tablet Take 12.5 mg by mouth 2 (two) times daily.     [provider]  Multiple Vitamin (THERA-TABS PO) Take 1 tablet by mouth daily.    [provider]  ondansetron (ZOFRAN-ODT) 4 MG disintegrating tablet Take 4 mg by mouth every 8 (eight) hours as needed for nausea or vomiting.    [provider]  oxyCODONE-acetaminophen (PERCOCET) 5-325 MG tablet Take 1 tablet by  mouth every 4 (four) hours as needed for severe pain. 08/01/17   Daleen Bo, MD  pantoprazole (PROTONIX) 40 MG tablet Take 1 tablet (40 mg total) by mouth 2 (two) times daily. 12/18/16   Eugenie Filler, MD  polyethylene glycol [facility] / Floria Raveling) packet Take 17 g by mouth daily. 07/29/17   Lavina Hamman, MD  potassium chloride (K-DUR) 10 MEQ tablet Take 2 tablets (20 mEq total) by mouth daily. 07/28/17   Lavina Hamman, MD  senna (SENOKOT) 8.6 MG TABS tablet Take 1 tablet by mouth 2 (two) times daily.    [provider]  senna-docusate (SENOKOT-S) 8.6-50 MG tablet Take 2 tablets by mouth 2 (two) times daily. 07/12/17   Lavina Hamman, MD  sucralfate (CARAFATE) 1 GM/10ML suspension Take 10 mLs (1 g total) by mouth 4 (four) times daily -  with meals and at bedtime. 07/12/17   Lavina Hamman, MD     Family History Family History  Problem Relation Age of Onset  . Cancer Father   . Diabetes Mellitus II Mother     Social History Social History   Tobacco Use  . Smoking status: Current Some Day Smoker    Packs/day: 0.10    Years: 30.00    Pack years: 3.00    Types: Cigarettes  . Smokeless tobacco: Never Used  . Tobacco comment: 1 cig a week  Substance Use Topics  . Alcohol use: No    Frequency: Never  . Drug use: No     Allergies   Patient has no known allergies.   Review of Systems Review of Systems  Constitutional: Negative for fever.  Respiratory: Positive for cough, sputum production, shortness of breath and wheezing. Negative for hemoptysis.   All other systems reviewed and are negative.    Physical Exam Updated Vital Signs BP (!) 168/104 (BP Location: Right Arm)   Pulse (!) 101   Temp 97.8 F (36.6 C) (Oral)   Resp (!) 25   SpO2 98%   Physical Exam  CONSTITUTIONAL: Elderly and chronically ill-appearing HEAD: Normocephalic/atraumatic EYES: EOMI ENMT: Mucous membranes moist NECK: supple no meningeal signs SPINE/BACK:entire spine nontender CV: S1/S2 noted, no murmurs/rubs/gallops noted LUNGS: Decreased breath sounds bilaterally, tachypnea, wheezing noted ABDOMEN: soft, protuberant, mild tenderness NEURO: Pt is awake/alert/appropriate, moves all extremitiesx4.  No facial droop.   EXTREMITIES: pulses normal/equal, full ROM Symmetric pitting edema to bilateral lower extremities SKIN: warm, color normal PSYCH: Anxious  ED Treatments / Results  Labs (all labs ordered are listed, but only abnormal results are displayed) Labs Reviewed - No data to display  EKG None  Radiology  Procedures Procedures  Medications Ordered in ED Medications  fentaNYL (SUBLIMAZE) injection 50 mcg (50 mcg Intravenous Given 08/12/17 0405)  albuterol (PROVENTIL) (2.5 MG/3ML) 0.083% nebulizer solution 5 mg (5 mg Nebulization Given 08/12/17 0408)  albuterol  (PROVENTIL) (2.5 MG/3ML) 0.083% nebulizer solution 5 mg (5 mg Nebulization Given 08/12/17 0508)  ipratropium (ATROVENT) nebulizer solution 0.5 mg (0.5 mg Nebulization Given 08/12/17 0508)  HYDROcodone-acetaminophen (NORCO/VICODIN) 5-325 MG per tablet 1 tablet (1 tablet Oral Given 08/12/17 0507)     Initial Impression / Assessment and Plan / ED Course  I have reviewed the triage vital signs and the nursing notes.       4:14 AM History of COPD and lung CA presents with shortness of breath.  Patient had up to 30 to the ER visit in the past 6 months, and was just recently discharged in the hospital.  He is a DNR.  I spoke to [place] his nurse at the local nursing facility.  She reports tonight he called out for shortness of breath and requested ER evaluation.  He declined any medications at the facility per nursing staff there.  He was on his oxygen at the time. He just received work-up including chest x-ray on the 21st. Defer work-up for now and treat symptoms. 6:52 AM Overall patient appears improved.  His work of breathing is improving and he is resting comfortably.  He is on 2 L at this time Still has residual wheeze, but no distress noted.  I feel he is appropriate for discharge back to facility and continue his home medications.  At this time I do not feel further work-up is necessary as he was just in the emergency department.  This appears to be a chronic issue. Final Clinical Impressions(s) / ED Diagnoses   Final diagnoses:  COPD exacerbation [facility])    ED Discharge Orders    None      Ripley Fraise, MD 08/12/17 [phone]

## 2017-08-12 NOTE — Discharge Instructions (Addendum)
If your shortness of breath worsens or you develop chest pain, fever, or any other new/concerning symptoms then return to the ER for evaluation.  Otherwise follow-up with your primary care physician this week.

## 2017-08-12 NOTE — ED Notes (Signed)
Date and time results received: 08/12/17 14:00   Test: Troponin  Critical Value: 0.03  Name of Provider Notified: Dr. Regenia Skeeter via face to face and informed primary nurse via department radio.   Orders Received? Or Actions Taken?: Will continue to monitor patient and await any new orders.

## 2017-08-12 NOTE — ED Notes (Signed)
ED Provider at bedside. 

## 2017-08-12 NOTE — ED Notes (Signed)
Bed: WA04 Expected date:  Expected time:  Means of arrival:  Comments: EMS-SOB

## 2017-08-12 NOTE — ED Triage Notes (Signed)
Pt arrives via EMS from [facility] with c/o sob today, worse tonight. Pt rec'd 10 Albuterol, 0.5 Atrovent, 125 Solumedrol, and 2 GM Magnesium en route to the ED via a PIV in the left forearm. EMS also reported the patient is a DNR, but staff was unable to locate the form prior to transport to the ED.

## 2017-08-12 NOTE — ED Notes (Signed)
Called [name], pt has to be on oxygen to go back to [facility]. [name] states there are 8 people ahead of him.

## 2017-08-12 NOTE — ED Triage Notes (Signed)
Pt arrived [facility]. [facility]. Pt DNR. Was here in [facility] for SOB. Right side diminished hx of lung cancer.  Joiner 3L 98% O2 as needed.  [facility]erol given at [facility] home.

## 2017-08-12 NOTE — ED Notes (Signed)
Bed: HU31 Expected date:  Expected time:  Means of arrival:  Comments: EMS 57 yo male resp distress/lung cancer and COPD

## 2017-08-12 NOTE — ED Provider Notes (Signed)
[facility] DEPT Provider Note   CSN: [id number] Arrival date & time: 08/12/17  1029     History   Chief Complaint Chief Complaint  Patient presents with  . Shortness of Breath    HPI [name] is a 57 y.o. male.  HPI  57 year old male with a history of lung cancer and COPD on chronic 2 L nasal cannula oxygen presents with shortness of breath.  He was seen last night for the same.  He states this episode of shortness of breath is about over 24 hours.  There is also a chest tightness which is similar to before.  Feels like his COPD but more severe.  He states that he has a little bit of a cough with white/yellow sputum.  No fevers.  Chronic lower extremity edema.  This is unchanged today.  Past Medical History:  Diagnosis Date  . Abdominal pain 06/04/2016  . Adenocarcinoma of left lung, stage 4 ([place]) 05/02/2016  . Alcohol abuse   . Bronchitis due to tobacco use ([place])   . Cancer ([place])    Lung  . COPD (chronic obstructive pulmonary disease) ([place])    on home o2 2LNC   . Dehydration 06/04/2016  . Encounter for antineoplastic chemotherapy 05/02/2016  . Gastritis   . Goals of care, counseling/discussion 05/02/2016  . Hematemesis   . HTN (hypertension) 10/30/2016  . Pulmonary embolism ([place]) 06/30/2017   on CTA chest  . Recurrent upper respiratory infection (URI)   . Seizures ([place]) 05/2011   new onset    Patient Active Problem List   Diagnosis Date Noted  . Protein-calorie malnutrition, severe 07/31/2017  . Thrombocytopenia ([place]) 07/29/2017  . Wheezing on exhalation 07/29/2017  . Anemia 07/24/2017  . Normocytic anemia 07/24/2017  . Chronic diastolic (congestive) heart failure ([place]) 07/24/2017  . Recurrent pleural effusion on right 07/15/2017  . Acute on chronic respiratory failure ([place]) 07/11/2017  . History of pulmonary embolism 07/11/2017  . PE (pulmonary thromboembolism) ([place]) 06/30/2017  . Pulmonary embolism ([place]) 06/30/2017  . AKI  (acute kidney injury) ([place]) 06/25/2017  . Dyspnea 06/21/2017  . Chronic combined systolic (congestive) and diastolic (congestive) heart failure ([place])   . Constipation 06/07/2017  . Pain   . Acute on chronic respiratory failure with hypoxia ([place]) 05/28/2017  . Chronic respiratory failure with hypoxia ([place]) 04/01/2017  . Leucocytosis 03/16/2017  . CKD (chronic kidney disease), stage III ([place]) 03/12/2017  . Malnutrition of moderate degree 03/10/2017  . COPD exacerbation ([place]) 03/09/2017  . Macrocytic anemia 02/07/2017  . Severe malnutrition ([place]) 01/03/2017  . DOE (dyspnea on exertion) 12/30/2016  . Nausea   . Hypervolemia   . HCAP (healthcare-associated pneumonia) 12/19/2016  . SOB (shortness of breath) 12/19/2016  . Erosive gastropathy 12/18/2016  . Gastritis 12/18/2016  . Hematemesis 12/16/2016  . Intractable nausea and vomiting 12/16/2016  . HTN (hypertension) 10/30/2016  . Chronic obstructive pulmonary disease ([place]) 09/02/2016  . COPD (chronic obstructive pulmonary disease) ([place]) 08/19/2016  . Dehydration 06/04/2016  . Abdominal pain 06/04/2016  . 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 Adenocarcinoma of left lung, stage 4 ([place]) 05/02/2016  . Goals of care, counseling/discussion 05/02/2016  . Encounter for antineoplastic chemotherapy 05/02/2016  . Tobacco abuse 05/30/2011  . Alcohol abuse 05/30/2011  . Seizure ([place]) 05/28/2011    Past Surgical History:  Procedure Laterality Date  . ESOPHAGOGASTRODUODENOSCOPY (EGD) WITH PROPOFOL N/A 12/17/2016   Procedure: ESOPHAGOGASTRODUODENOSCOPY (EGD) WITH PROPOFOL;  Surgeon: Ladene Artist, MD;  Location: [facility] ENDOSCOPY;  Service: Endoscopy;  Laterality: N/A;  . IR FLUORO GUIDE PORT INSERTION RIGHT  05/13/2016  . IR US GUIDE VASC ACCESS RIGHT  05/13/2016  . NO PAST SURGERIES    . THORACENTESIS  07/05/2017   ultrasound guided        Home Medications    Prior to Admission medications   Medication Sig Start Date End Date Taking?  Authorizing Provider  acetaminophen (TYLENOL) 500 MG tablet Take 1 tablet (500 mg total) by mouth 2 (two) times daily as needed for mild pain. 07/07/17   Florencia Reasons, MD  albuterol (PROVENTIL) (2.5 MG/3ML) 0.083% nebulizer solution 1 neb every 4-6 hours as needed for wheezing and shortness of breath Patient taking differently: Take 2.5 mg by nebulization every 6 (six) hours as needed for wheezing or shortness of breath.  04/01/17   Parrett, Fonnie Mu, NP  apixaban (ELIQUIS) 5 MG TABS tablet Take 5 mg by mouth 2 (two) times daily.    [provider]  Chlorphen-Phenyleph-ASA (ALKA-SELTZER PLUS COLD PO) Take 2 tablets by mouth at bedtime as needed (COUGH).    [provider]  clonazePAM (KLONOPIN) 0.5 MG tablet Take 0.25 mg by mouth 2 (two) times daily as needed for anxiety.    [provider]  Dextromethorphan-guaiFENesin (TUSSIN DM) 10-100 MG/5ML liquid Take 5 mLs by mouth every 12 (twelve) hours.    [provider]  escitalopram (LEXAPRO) 5 MG tablet Take 1 tablet (5 mg total) by mouth at bedtime. 04/17/17   Rosita Fire, MD  feeding supplement, ENSURE ENLIVE, (ENSURE ENLIVE) LIQD Take 237 mLs by mouth 3 (three) times daily between meals. Patient not taking: Reported on 08/12/2017 03/19/17   Shelly Coss, MD  ferrous sulfate 325 (65 FE) MG tablet Take 1 tablet (325 mg total) by mouth 2 (two) times daily with a meal. 07/12/17   Lavina Hamman, MD  Fluticasone-Umeclidin-Vilant (TRELEGY ELLIPTA) 100-62.5-25 MCG/INH AEPB Inhale 1 puff into the lungs daily. 05/31/17   Aline August, MD  folic acid (FOLVITE) 1 MG tablet Take 1 tablet (1 mg total) by mouth daily. 07/13/17   Lavina Hamman, MD  furosemide (LASIX) 40 MG tablet Take 1 tablet (40 mg total) by mouth 2 (two) times daily. Patient taking differently: Take 40 mg by mouth daily.  08/09/17   Lavina Hamman, MD  gabapentin (NEURONTIN) 100 MG capsule Take 1 capsule (100 mg total) by mouth 3 (three) times daily.  07/12/17   Lavina Hamman, MD  guaiFENesin (MUCINEX) 600 MG 12 hr tablet Take 1,200 mg by mouth 2 (two) times daily.     [provider]  hydrOXYzine (ATARAX/VISTARIL) 10 MG tablet Take 1 tablet (10 mg total) by mouth 3 (three) times daily as needed for anxiety. 07/25/17   Donne Hazel, MD  ipratropium-albuterol (DUONEB) 0.5-2.5 (3) MG/3ML SOLN Take 3 mLs by nebulization 4 (four) times daily. 07/28/17   Lavina Hamman, MD  loratadine (CLARITIN) 10 MG tablet Take 10 mg by mouth daily.    [provider]  metoprolol tartrate (LOPRESSOR) 25 MG tablet Take 12.5 mg by mouth 2 (two) times daily.     [provider]  Multiple Vitamin (THERA-TABS PO) Take 1 tablet by mouth daily.    [provider]  ondansetron (ZOFRAN-ODT) 4 MG disintegrating tablet Take 4 mg by mouth every 8 (eight) hours as needed for nausea or vomiting.    [provider]  Oxycodone HCl 10 MG TABS Take 10 mg by mouth every  4 (four) hours as needed (pain).    [provider]  oxyCODONE-acetaminophen (PERCOCET) 5-325 MG tablet Take 1 tablet by mouth every 4 (four) hours as needed for severe pain. 08/01/17   Daleen Bo, MD  pantoprazole (PROTONIX) 40 MG tablet Take 1 tablet (40 mg total) by mouth 2 (two) times daily. 12/18/16   Eugenie Filler, MD  polyethylene glycol [facility] Floria Raveling) packet Take 17 g by mouth daily. 07/29/17   Lavina Hamman, MD  potassium chloride (K-DUR) 10 MEQ tablet Take 2 tablets (20 mEq total) by mouth daily. 07/28/17   Lavina Hamman, MD  senna (SENOKOT) 8.6 MG TABS tablet Take 2 tablets by mouth 2 (two) times daily.     [provider]  senna-docusate (SENOKOT-S) 8.6-50 MG tablet Take 2 tablets by mouth 2 (two) times daily. 07/12/17   Lavina Hamman, MD  sucralfate (CARAFATE) 1 GM/10ML suspension Take 10 mLs (1 g total) by mouth 4 (four) times daily -  with meals and at bedtime. 07/12/17   Lavina Hamman, MD    Family History Family History    Problem Relation Age of Onset  . Cancer Father   . Diabetes Mellitus II Mother     Social History Social History   Tobacco Use  . Smoking status: Current Some Day Smoker    Packs/day: 0.10    Years: 30.00    Pack years: 3.00    Types: Cigarettes  . Smokeless tobacco: Never Used  . Tobacco comment: 1 cig a week  Substance Use Topics  . Alcohol use: No    Frequency: Never  . Drug use: No     Allergies   Patient has no known allergies.   Review of Systems Review of Systems  Constitutional: Negative for fever.  Respiratory: Positive for cough and shortness of breath.   Cardiovascular: Positive for chest pain and leg swelling.  Gastrointestinal: Negative for abdominal pain and vomiting.  All other systems reviewed and are negative.    Physical Exam Updated Vital Signs BP (!) 156/80   Pulse 72   Temp 98.1 F (36.7 C) (Oral)   Resp 19   SpO2 100%   Physical Exam  Constitutional: He is oriented to person, place, and time. He appears well-developed and well-nourished.  HENT:  Head: Normocephalic and atraumatic.  Right Ear: External ear normal.  Left Ear: External ear normal.  Nose: Nose normal.  Eyes: Right eye exhibits no discharge. Left eye exhibits no discharge.  Neck: Neck supple.  Cardiovascular: Normal rate, regular rhythm and normal heart sounds.  Pulmonary/Chest: Effort normal. He has decreased breath sounds in the right lower field and the left lower field. He has no wheezes. He has rales in the right lower field and the left lower field.  Abdominal: Soft. There is no tenderness.  Musculoskeletal:       Right lower leg: He exhibits edema.       Left lower leg: He exhibits edema.  Mild pitting edema to feet, ankles, inferior lower legs  Neurological: He is alert and oriented to person, place, and time.  Skin: Skin is warm and dry.  Nursing note and vitals reviewed.    ED Treatments / Results  Labs (all labs ordered are listed, but only abnormal  results are displayed) Labs Reviewed  COMPREHENSIVE METABOLIC PANEL - Abnormal; Notable for the following components:      Result Value   Glucose, Bld 151 (*)    BUN 30 (*)  Creatinine, Ser 1.53 (*)    Albumin 3.3 (*)    GFR calc non Af Amer 48 (*)    GFR calc Af Amer 56 (*)    All other components within normal limits  CBC WITH DIFFERENTIAL/PLATELET - Abnormal; Notable for the following components:   WBC 15.2 (*)    RBC 2.33 (*)    Hemoglobin 7.9 (*)    HCT 24.3 (*)    MCV 104.3 (*)    RDW 20.3 (*)    Neutro Abs 14.8 (*)    Lymphs Abs 0.2 (*)    All other components within normal limits  TROPONIN I - Abnormal; Notable for the following components:   Troponin I 0.03 (*)    All other components within normal limits  BRAIN NATRIURETIC PEPTIDE    EKG EKG Interpretation  Date/Time:  Tuesday August 12 2017 11:12:39 EDT Ventricular Rate:  84 PR Interval:    QRS Duration: 86 QT Interval:  399 QTC Calculation: 472 R Axis:   104 Text Interpretation:  Sinus rhythm Borderline short PR interval Right axis deviation Consider anterior infarct tachycardia no longer present when compared to August 10 2017 Confirmed by Sherwood Gambler [phone]) on 08/12/2017 11:37:27 AM   Radiology Dg Chest 2 View  Result Date: 08/12/2017 CLINICAL DATA:  Cough.  Wheezing. EXAM: CHEST - 2 VIEW COMPARISON:  08/10/2017. FINDINGS: PowerPort catheter with tip over the right atrium in stable position. Heart size normal. Right basilar atelectasis. Bilateral pleural effusions. No pneumothorax. IMPRESSION: 1.  PowerPort catheter noted in stable position. 2. Right basilar atelectasis again noted. Small bilateral pleural effusions right side greater than left again noted. No interim change. Electronically Signed   By: Marcello Moores  Register   On: 08/12/2017 12:11   Dg Chest Port 1 View  Result Date: 08/10/2017 CLINICAL DATA:  History of lung carcinoma. Chest pain and shortness of breath. EXAM: PORTABLE CHEST 1 VIEW  COMPARISON:  August 06, 2017 FINDINGS: Port-A-Cath tip is in the superior vena cava near the cavoatrial junction. No pneumothorax. There is a right pleural effusion with right base atelectasis. There is a minimal left pleural effusion. There is no edema or consolidation. Heart size and pulmonary vascularity are normal. No evident adenopathy. No bone lesions. IMPRESSION: Right pleural effusion with right base atelectasis. Minimal left pleural effusion. No consolidation. Heart size and pulmonary vascularity within normal limits. No adenopathy appreciable by radiography. Port-A-Cath tip in superior vena cava. No pneumothorax. Electronically Signed   By: Lowella Grip III M.D.   On: 08/10/2017 19:08    Procedures Procedures (including critical care time)  Medications Ordered in ED Medications  ipratropium-albuterol (DUONEB) 0.5-2.5 (3) MG/3ML nebulizer solution 3 mL (3 mLs Nebulization Given 08/12/17 1205)  oxyCODONE (Oxy IR/ROXICODONE) immediate release tablet 10 mg (10 mg Oral Given 08/12/17 1429)     Initial Impression / Assessment and Plan / ED Course  I have reviewed the triage vital signs and the nursing notes.  Pertinent labs & imaging results that were available during my care of the patient were reviewed by me and considered in my medical decision making (see chart for details).     Patient presents with acute on chronic shortness of breath.  No new findings on work-up.  Chest x-ray is chronically stable.  He is not hypoxic with his baseline 2 L nasal cannula.  He reports no shortness of breath on reexamination after breathing treatment.  His hemoglobin is low at 7.9 but he has been trending around 7.9 or  8 over the last several weeks and this is less stable.  Unclear why was up to 9 earlier but multiple have been in the 7-8 range.  Creatinine is stable.  No signs of a pneumonia.  His troponin is minimally elevated at 0.03 but this is also stable.  Highly doubt ACS, PE, or other acute  intrathoracic pathology.  He appears stable to be discharged back to his facility. Return precautions.  Final Clinical Impressions(s) / ED Diagnoses   Final diagnoses:  Shortness of breath    ED Discharge Orders    None       Sherwood Gambler, MD 08/12/17 1517

## 2017-08-13 ENCOUNTER — Other Ambulatory Visit: Payer: Self-pay | Admitting: Medical Oncology

## 2017-08-13 ENCOUNTER — Inpatient Hospital Stay (HOSPITAL_COMMUNITY)
Admission: EM | Admit: 2017-08-13 | Discharge: 2017-08-17 | DRG: 190 | Disposition: A | Discharge status: Nursing Home (Includes ICF) | Payer: Medicaid Other | Attending: Internal Medicine | Admitting: Internal Medicine

## 2017-08-13 ENCOUNTER — Emergency Department (HOSPITAL_COMMUNITY): Payer: Medicaid Other

## 2017-08-13 ENCOUNTER — Telehealth: Payer: Self-pay | Admitting: Medical Oncology

## 2017-08-13 DIAGNOSIS — C3492 Malignant neoplasm of unspecified part of left bronchus or lung: Secondary | ICD-10-CM | POA: Diagnosis present

## 2017-08-13 DIAGNOSIS — J9622 Acute and chronic respiratory failure with hypercapnia: Secondary | ICD-10-CM | POA: Diagnosis present

## 2017-08-13 DIAGNOSIS — N183 Chronic kidney disease, stage 3 unspecified: Secondary | ICD-10-CM | POA: Diagnosis present

## 2017-08-13 DIAGNOSIS — Z7951 Long term (current) use of inhaled steroids: Secondary | ICD-10-CM | POA: Diagnosis not present

## 2017-08-13 DIAGNOSIS — Z86711 Personal history of pulmonary embolism: Secondary | ICD-10-CM

## 2017-08-13 DIAGNOSIS — F17210 Nicotine dependence, cigarettes, uncomplicated: Secondary | ICD-10-CM | POA: Diagnosis present

## 2017-08-13 DIAGNOSIS — Z7901 Long term (current) use of anticoagulants: Secondary | ICD-10-CM

## 2017-08-13 DIAGNOSIS — Z86718 Personal history of other venous thrombosis and embolism: Secondary | ICD-10-CM

## 2017-08-13 DIAGNOSIS — R110 Nausea: Secondary | ICD-10-CM | POA: Diagnosis present

## 2017-08-13 DIAGNOSIS — J9621 Acute and chronic respiratory failure with hypoxia: Secondary | ICD-10-CM | POA: Diagnosis present

## 2017-08-13 DIAGNOSIS — F419 Anxiety disorder, unspecified: Secondary | ICD-10-CM | POA: Diagnosis present

## 2017-08-13 DIAGNOSIS — E7439 Other disorders of intestinal carbohydrate absorption: Secondary | ICD-10-CM | POA: Diagnosis present

## 2017-08-13 DIAGNOSIS — D509 Iron deficiency anemia, unspecified: Secondary | ICD-10-CM | POA: Diagnosis present

## 2017-08-13 DIAGNOSIS — J441 Chronic obstructive pulmonary disease with (acute) exacerbation: Secondary | ICD-10-CM

## 2017-08-13 DIAGNOSIS — I5042 Chronic combined systolic (congestive) and diastolic (congestive) heart failure: Secondary | ICD-10-CM | POA: Diagnosis present

## 2017-08-13 DIAGNOSIS — Z66 Do not resuscitate: Secondary | ICD-10-CM | POA: Diagnosis present

## 2017-08-13 DIAGNOSIS — Z79899 Other long term (current) drug therapy: Secondary | ICD-10-CM

## 2017-08-13 DIAGNOSIS — E870 Hyperosmolality and hypernatremia: Secondary | ICD-10-CM | POA: Diagnosis present

## 2017-08-13 DIAGNOSIS — Z9981 Dependence on supplemental oxygen: Secondary | ICD-10-CM

## 2017-08-13 DIAGNOSIS — I130 Hypertensive heart and chronic kidney disease with heart failure and stage 1 through stage 4 chronic kidney disease, or unspecified chronic kidney disease: Secondary | ICD-10-CM | POA: Diagnosis present

## 2017-08-13 DIAGNOSIS — C7951 Secondary malignant neoplasm of bone: Secondary | ICD-10-CM

## 2017-08-13 LAB — CBC WITH DIFFERENTIAL/PLATELET
BASOS PCT: 0 %
Basophils Absolute: 0 10*3/uL (ref 0.0–0.1)
Eosinophils Absolute: 0 10*3/uL (ref 0.0–0.7)
Eosinophils Relative: 0 %
HEMATOCRIT: 26.8 % — AB (ref 39.0–52.0)
Hemoglobin: 8.5 g/dL — ABNORMAL LOW (ref 13.0–17.0)
Lymphocytes Relative: 6 %
Lymphs Abs: 0.9 10*3/uL (ref 0.7–4.0)
MCH: 33.6 pg (ref 26.0–34.0)
MCHC: 31.7 g/dL (ref 30.0–36.0)
MCV: 105.9 fL — ABNORMAL HIGH (ref 78.0–100.0)
MONOS PCT: 4 %
Monocytes Absolute: 0.5 10*3/uL (ref 0.1–1.0)
NEUTROS ABS: 11.9 10*3/uL — AB (ref 1.7–7.7)
Neutrophils Relative %: 90 %
Platelets: 208 10*3/uL (ref 150–400)
RBC: 2.53 MIL/uL — AB (ref 4.22–5.81)
RDW: 20 % — ABNORMAL HIGH (ref 11.5–15.5)
WBC: 13.3 10*3/uL — AB (ref 4.0–10.5)

## 2017-08-13 LAB — COMPREHENSIVE METABOLIC PANEL
ALBUMIN: 3.5 g/dL (ref 3.5–5.0)
ALK PHOS: 111 U/L (ref 38–126)
ALT: 22 U/L (ref 0–44)
ANION GAP: 11 (ref 5–15)
AST: 26 U/L (ref 15–41)
BILIRUBIN TOTAL: 0.4 mg/dL (ref 0.3–1.2)
BUN: 27 mg/dL — AB (ref 6–20)
CALCIUM: 9.7 mg/dL (ref 8.9–10.3)
CO2: 40 mmol/L — AB (ref 22–32)
CREATININE: 1.44 mg/dL — AB (ref 0.61–1.24)
Chloride: 101 mmol/L (ref 98–111)
GFR calc Af Amer: >60 mL/min (ref >60)
GFR calc non Af Amer: 52 mL/min — ABNORMAL LOW (ref >60)
GLUCOSE: 99 mg/dL (ref 70–99)
Potassium: 4.2 mmol/L (ref 3.5–5.1)
Sodium: 152 mmol/L — ABNORMAL HIGH (ref 135–145)
TOTAL PROTEIN: 6.9 g/dL (ref 6.5–8.1)

## 2017-08-13 LAB — I-STAT TROPONIN, ED
Troponin i, poc: 0.01 ng/mL (ref 0.00–0.08)
Troponin i, poc: 0.01 ng/mL (ref 0.00–0.08)

## 2017-08-13 LAB — I-STAT CG4 LACTIC ACID, ED
Lactic Acid, Venous: 0.7 mmol/L (ref 0.5–1.9)
Lactic Acid, Venous: 0.33 mmol/L — ABNORMAL LOW (ref 0.5–1.9)

## 2017-08-13 LAB — LIPASE, BLOOD: Lipase: 24 U/L (ref 11–51)

## 2017-08-13 LAB — BRAIN NATRIURETIC PEPTIDE: B Natriuretic Peptide: 80.4 pg/mL (ref 0.0–100.0)

## 2017-08-13 MED ORDER — LORATADINE 10 MG PO TABS
10.0000 mg | ORAL_TABLET | Freq: Every day | ORAL | Status: DC
  Administered 2017-08-14 – 2017-08-17 (×4): 10 mg via ORAL
  Filled 2017-08-13 (×4): qty 1

## 2017-08-13 MED ORDER — IPRATROPIUM-ALBUTEROL 0.5-2.5 (3) MG/3ML IN SOLN
3.0000 mL | Freq: Once | RESPIRATORY_TRACT | Status: AC
  Administered 2017-08-13: 3 mL via RESPIRATORY_TRACT
  Filled 2017-08-13: qty 3

## 2017-08-13 MED ORDER — MAGNESIUM SULFATE 2 GM/50ML IV SOLN
2.0000 g | Freq: Once | INTRAVENOUS | Status: AC
Start: 2017-08-13 — End: 2017-08-13
  Administered 2017-08-13: 2 g via INTRAVENOUS
  Filled 2017-08-13: qty 50

## 2017-08-13 MED ORDER — PREDNISONE 20 MG PO TABS
40.0000 mg | ORAL_TABLET | Freq: Every day | ORAL | Status: DC
  Administered 2017-08-14: 40 mg via ORAL
  Filled 2017-08-13: qty 2

## 2017-08-13 MED ORDER — GUAIFENESIN ER 600 MG PO TB12
1200.0000 mg | ORAL_TABLET | Freq: Two times a day (BID) | ORAL | Status: DC
  Administered 2017-08-14 – 2017-08-17 (×6): 1200 mg via ORAL
  Filled 2017-08-13 (×6): qty 2

## 2017-08-13 MED ORDER — ACETAMINOPHEN 325 MG PO TABS: 650.0000 mg | ORAL_TABLET | Freq: Four times a day (QID) | ORAL | Status: DC | PRN

## 2017-08-13 MED ORDER — MOMETASONE FURO-FORMOTEROL FUM 200-5 MCG/ACT IN AERO: 2.0000 | INHALATION_SPRAY | Freq: Two times a day (BID) | RESPIRATORY_TRACT | Status: DC

## 2017-08-13 MED ORDER — ONDANSETRON HCL 4 MG/2ML IJ SOLN
4.0000 mg | Freq: Four times a day (QID) | INTRAMUSCULAR | Status: DC | PRN
  Administered 2017-08-14 – 2017-08-17 (×4): 4 mg via INTRAVENOUS
  Filled 2017-08-13 (×5): qty 2

## 2017-08-13 MED ORDER — POTASSIUM CHLORIDE ER 10 MEQ PO TBCR: 20.0000 meq | EXTENDED_RELEASE_TABLET | Freq: Every day | ORAL | Status: DC

## 2017-08-13 MED ORDER — METOPROLOL TARTRATE 12.5 MG HALF TABLET
12.5000 mg | ORAL_TABLET | Freq: Two times a day (BID) | ORAL | Status: DC
  Administered 2017-08-14 – 2017-08-15 (×3): 12.5 mg via ORAL
  Filled 2017-08-13 (×3): qty 1

## 2017-08-13 MED ORDER — CLONAZEPAM 0.5 MG PO TABS
0.2500 mg | ORAL_TABLET | Freq: Two times a day (BID) | ORAL | Status: DC | PRN
  Administered 2017-08-14: 0.25 mg via ORAL
  Filled 2017-08-13 (×2): qty 1

## 2017-08-13 MED ORDER — IPRATROPIUM BROMIDE 0.02 % IN SOLN: 0.5000 mg | Freq: Four times a day (QID) | RESPIRATORY_TRACT | Status: DC

## 2017-08-13 MED ORDER — PANTOPRAZOLE SODIUM 40 MG PO TBEC
40.0000 mg | DELAYED_RELEASE_TABLET | Freq: Two times a day (BID) | ORAL | Status: DC
  Administered 2017-08-14 – 2017-08-17 (×6): 40 mg via ORAL
  Filled 2017-08-13 (×6): qty 1

## 2017-08-13 MED ORDER — APIXABAN 5 MG PO TABS
5.0000 mg | ORAL_TABLET | Freq: Two times a day (BID) | ORAL | Status: DC
  Administered 2017-08-14 – 2017-08-17 (×7): 5 mg via ORAL
  Filled 2017-08-13 (×8): qty 1

## 2017-08-13 MED ORDER — DEXTROSE 5 % IV SOLN
INTRAVENOUS | Status: DC
  Administered 2017-08-14 (×3): via INTRAVENOUS

## 2017-08-13 MED ORDER — GUAIFENESIN-DM 100-10 MG/5ML PO SYRP
5.0000 mL | ORAL_SOLUTION | Freq: Two times a day (BID) | ORAL | Status: DC
  Administered 2017-08-14 – 2017-08-17 (×6): 5 mL via ORAL
  Filled 2017-08-13 (×7): qty 10

## 2017-08-13 MED ORDER — ONDANSETRON HCL 4 MG PO TABS
4.0000 mg | ORAL_TABLET | Freq: Four times a day (QID) | ORAL | Status: DC | PRN
  Administered 2017-08-15: 4 mg via ORAL

## 2017-08-13 MED ORDER — ALBUTEROL SULFATE (2.5 MG/3ML) 0.083% IN NEBU
2.5000 mg | INHALATION_SOLUTION | RESPIRATORY_TRACT | Status: DC | PRN
  Administered 2017-08-17: 2.5 mg via RESPIRATORY_TRACT
  Filled 2017-08-13: qty 3

## 2017-08-13 MED ORDER — FUROSEMIDE 40 MG PO TABS: 40.0000 mg | ORAL_TABLET | Freq: Every day | ORAL | Status: DC

## 2017-08-13 MED ORDER — SENNOSIDES-DOCUSATE SODIUM 8.6-50 MG PO TABS
2.0000 | ORAL_TABLET | Freq: Two times a day (BID) | ORAL | Status: DC
  Filled 2017-08-13 (×3): qty 2

## 2017-08-13 MED ORDER — FERROUS SULFATE 325 (65 FE) MG PO TABS
325.0000 mg | ORAL_TABLET | Freq: Two times a day (BID) | ORAL | Status: DC
  Administered 2017-08-15 – 2017-08-16 (×2): 325 mg via ORAL
  Filled 2017-08-13 (×6): qty 1

## 2017-08-13 MED ORDER — FOLIC ACID 1 MG PO TABS
1.0000 mg | ORAL_TABLET | Freq: Every day | ORAL | Status: DC
  Administered 2017-08-14 – 2017-08-17 (×2): 1 mg via ORAL
  Filled 2017-08-13 (×3): qty 1

## 2017-08-13 MED ORDER — HYDROXYZINE HCL 10 MG PO TABS
10.0000 mg | ORAL_TABLET | Freq: Three times a day (TID) | ORAL | Status: DC | PRN
  Administered 2017-08-14 – 2017-08-15 (×2): 10 mg via ORAL
  Filled 2017-08-13 (×2): qty 1

## 2017-08-13 MED ORDER — HYDROCOD POLST-CPM POLST ER 10-8 MG/5ML PO SUER
5.0000 mL | Freq: Two times a day (BID) | ORAL | Status: DC
  Administered 2017-08-14 – 2017-08-17 (×6): 5 mL via ORAL
  Filled 2017-08-13 (×7): qty 5

## 2017-08-13 MED ORDER — ACETAMINOPHEN 500 MG PO TABS: 500.0000 mg | ORAL_TABLET | Freq: Two times a day (BID) | ORAL | Status: DC | PRN

## 2017-08-13 MED ORDER — GABAPENTIN 100 MG PO CAPS
100.0000 mg | ORAL_CAPSULE | Freq: Three times a day (TID) | ORAL | Status: DC
Start: 2017-08-14 — End: 2017-08-17
  Administered 2017-08-14 – 2017-08-17 (×9): 100 mg via ORAL
  Filled 2017-08-13 (×9): qty 1

## 2017-08-13 MED ORDER — SUCRALFATE 1 GM/10ML PO SUSP
1.0000 g | Freq: Three times a day (TID) | ORAL | Status: DC
  Administered 2017-08-14 – 2017-08-17 (×12): 1 g via ORAL
  Filled 2017-08-13 (×12): qty 10

## 2017-08-13 MED ORDER — ACETAMINOPHEN 650 MG RE SUPP: 650.0000 mg | Freq: Four times a day (QID) | RECTAL | Status: DC | PRN

## 2017-08-13 MED ORDER — POLYETHYLENE GLYCOL 3350 17 G PO PACK
17.0000 g | PACK | Freq: Every day | ORAL | Status: DC
  Filled 2017-08-13 (×2): qty 1

## 2017-08-13 MED ORDER — METHYLPREDNISOLONE SODIUM SUCC 125 MG IJ SOLR
125.0000 mg | Freq: Once | INTRAMUSCULAR | Status: AC
  Administered 2017-08-13: 125 mg via INTRAVENOUS
  Filled 2017-08-13: qty 2

## 2017-08-13 MED ORDER — OXYCODONE HCL 5 MG PO TABS
10.0000 mg | ORAL_TABLET | ORAL | Status: DC | PRN
  Administered 2017-08-14: 10 mg via ORAL
  Filled 2017-08-13 (×2): qty 2

## 2017-08-13 MED ORDER — IPRATROPIUM-ALBUTEROL 0.5-2.5 (3) MG/3ML IN SOLN
3.0000 mL | Freq: Four times a day (QID) | RESPIRATORY_TRACT | Status: DC
  Administered 2017-08-13 – 2017-08-14 (×3): 3 mL via RESPIRATORY_TRACT
  Filled 2017-08-13 (×2): qty 3

## 2017-08-13 MED ORDER — ESCITALOPRAM OXALATE 10 MG PO TABS
5.0000 mg | ORAL_TABLET | Freq: Every day | ORAL | Status: DC
  Administered 2017-08-14 – 2017-08-16 (×2): 5 mg via ORAL
  Filled 2017-08-13 (×4): qty 1

## 2017-08-13 MED ORDER — OXYCODONE HCL 10 MG PO TABS: 10.0000 mg | ORAL_TABLET | ORAL | 0 refills | Status: DC | PRN

## 2017-08-13 MED ORDER — ALBUTEROL SULFATE (2.5 MG/3ML) 0.083% IN NEBU: 2.5000 mg | INHALATION_SOLUTION | Freq: Four times a day (QID) | RESPIRATORY_TRACT | Status: DC | PRN

## 2017-08-13 MED ORDER — IPRATROPIUM-ALBUTEROL 0.5-2.5 (3) MG/3ML IN SOLN
RESPIRATORY_TRACT | Status: AC
  Filled 2017-08-13: qty 3

## 2017-08-13 MED ORDER — FLUTICASONE-UMECLIDIN-VILANT 100-62.5-25 MCG/INH IN AEPB: 1.0000 | INHALATION_SPRAY | Freq: Every day | RESPIRATORY_TRACT | Status: DC
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
## 2017-08-13 NOTE — ED Notes (Signed)
Patient transported to X-ray 

## 2017-08-13 NOTE — ED Triage Notes (Signed)
Transported by [employer] from [facility]. +SHOB onset 1 hr ago, history of lung CA, reported abdominal distension. Recently seen for same.

## 2017-08-13 NOTE — Telephone Encounter (Signed)
oxycodone rx ready for pick up.Staff notified.

## 2017-08-13 NOTE — ED Notes (Signed)
Bed: QV95 Expected date:  Expected time:  Means of arrival:  Comments: EMS/Lung cancer/shob
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
## 2017-08-14 ENCOUNTER — Other Ambulatory Visit: Payer: Self-pay

## 2017-08-14 ENCOUNTER — Encounter (HOSPITAL_COMMUNITY): Payer: Self-pay

## 2017-08-14 DIAGNOSIS — J9621 Acute and chronic respiratory failure with hypoxia: Secondary | ICD-10-CM

## 2017-08-14 DIAGNOSIS — J9622 Acute and chronic respiratory failure with hypercapnia: Secondary | ICD-10-CM

## 2017-08-14 DIAGNOSIS — I5042 Chronic combined systolic (congestive) and diastolic (congestive) heart failure: Secondary | ICD-10-CM

## 2017-08-14 DIAGNOSIS — C3492 Malignant neoplasm of unspecified part of left bronchus or lung: Secondary | ICD-10-CM

## 2017-08-14 LAB — BASIC METABOLIC PANEL
Anion gap: 9 (ref 5–15)
Anion gap: 9 (ref 5–15)
Anion gap: 13 (ref 5–15)
BUN: 28 mg/dL — AB (ref 6–20)
BUN: 27 mg/dL — AB (ref 6–20)
BUN: 23 mg/dL — AB (ref 6–20)
CALCIUM: 9.5 mg/dL (ref 8.9–10.3)
CO2: 36 mmol/L — ABNORMAL HIGH (ref 22–32)
CO2: 40 mmol/L — ABNORMAL HIGH (ref 22–32)
CO2: 35 mmol/L — ABNORMAL HIGH (ref 22–32)
CREATININE: 1.47 mg/dL — AB (ref 0.61–1.24)
CREATININE: 1.49 mg/dL — AB (ref 0.61–1.24)
CREATININE: 1.36 mg/dL — AB (ref 0.61–1.24)
Calcium: 9.2 mg/dL (ref 8.9–10.3)
Calcium: 9.2 mg/dL (ref 8.9–10.3)
Chloride: 97 mmol/L — ABNORMAL LOW (ref 98–111)
Chloride: 95 mmol/L — ABNORMAL LOW (ref 98–111)
Chloride: 95 mmol/L — ABNORMAL LOW (ref 98–111)
GFR calc Af Amer: 59 mL/min — ABNORMAL LOW (ref >60)
GFR calc Af Amer: 58 mL/min — ABNORMAL LOW (ref >60)
GFR calc Af Amer: >60 mL/min (ref >60)
GFR, EST NON AFRICAN AMERICAN: 51 mL/min — AB (ref >60)
GFR, EST NON AFRICAN AMERICAN: 50 mL/min — AB (ref >60)
GFR, EST NON AFRICAN AMERICAN: 56 mL/min — AB (ref >60)
Glucose, Bld: 210 mg/dL — ABNORMAL HIGH (ref 70–99)
Glucose, Bld: 131 mg/dL — ABNORMAL HIGH (ref 70–99)
Glucose, Bld: 232 mg/dL — ABNORMAL HIGH (ref 70–99)
POTASSIUM: 4 mmol/L (ref 3.5–5.1)
Potassium: 3.8 mmol/L (ref 3.5–5.1)
Potassium: 3.8 mmol/L (ref 3.5–5.1)
SODIUM: 142 mmol/L (ref 135–145)
SODIUM: 144 mmol/L (ref 135–145)
SODIUM: 143 mmol/L (ref 135–145)

## 2017-08-14 LAB — MRSA PCR SCREENING: MRSA BY PCR: NEGATIVE

## 2017-08-14 MED ORDER — CLONAZEPAM 0.5 MG PO TABS
0.5000 mg | ORAL_TABLET | Freq: Two times a day (BID) | ORAL | Status: DC | PRN
  Administered 2017-08-15: 0.5 mg via ORAL
  Filled 2017-08-14: qty 1

## 2017-08-14 MED ORDER — LORAZEPAM 2 MG/ML IJ SOLN
0.5000 mg | Freq: Four times a day (QID) | INTRAMUSCULAR | Status: DC | PRN
  Administered 2017-08-15: 0.5 mg via INTRAVENOUS
  Filled 2017-08-14: qty 1

## 2017-08-14 MED ORDER — ENSURE ENLIVE PO LIQD
237.0000 mL | Freq: Three times a day (TID) | ORAL | Status: DC
  Administered 2017-08-14 – 2017-08-15 (×2): 237 mL via ORAL

## 2017-08-14 MED ORDER — PRO-STAT SUGAR FREE PO LIQD
30.0000 mL | Freq: Every day | ORAL | Status: DC
  Filled 2017-08-14 (×2): qty 30

## 2017-08-14 MED ORDER — ALBUTEROL SULFATE (2.5 MG/3ML) 0.083% IN NEBU: 2.5000 mg | INHALATION_SOLUTION | Freq: Four times a day (QID) | RESPIRATORY_TRACT | Status: DC

## 2017-08-14 MED ORDER — IPRATROPIUM-ALBUTEROL 0.5-2.5 (3) MG/3ML IN SOLN
3.0000 mL | Freq: Four times a day (QID) | RESPIRATORY_TRACT | Status: DC
  Administered 2017-08-14 – 2017-08-17 (×13): 3 mL via RESPIRATORY_TRACT
  Filled 2017-08-14 (×14): qty 3

## 2017-08-14 MED ORDER — METHYLPREDNISOLONE SODIUM SUCC 125 MG IJ SOLR
80.0000 mg | Freq: Three times a day (TID) | INTRAMUSCULAR | Status: DC
  Administered 2017-08-14 – 2017-08-15 (×3): 80 mg via INTRAVENOUS
  Filled 2017-08-14 (×3): qty 2

## 2017-08-14 MED ORDER — HYDRALAZINE HCL 20 MG/ML IJ SOLN
10.0000 mg | Freq: Four times a day (QID) | INTRAMUSCULAR | Status: DC | PRN
  Administered 2017-08-14 – 2017-08-15 (×2): 10 mg via INTRAVENOUS
  Filled 2017-08-14 (×2): qty 1

## 2017-08-14 MED ORDER — UMECLIDINIUM BROMIDE 62.5 MCG/INH IN AEPB
1.0000 | INHALATION_SPRAY | Freq: Every day | RESPIRATORY_TRACT | Status: DC
  Administered 2017-08-15 – 2017-08-17 (×3): 1 via RESPIRATORY_TRACT
  Filled 2017-08-14: qty 7

## 2017-08-14 MED ORDER — FLUTICASONE FUROATE-VILANTEROL 100-25 MCG/INH IN AEPB
1.0000 | INHALATION_SPRAY | Freq: Every day | RESPIRATORY_TRACT | Status: DC
  Administered 2017-08-15 – 2017-08-17 (×3): 1 via RESPIRATORY_TRACT
  Filled 2017-08-14: qty 28

## 2017-08-14 MED ORDER — FUROSEMIDE 40 MG PO TABS: 40.0000 mg | ORAL_TABLET | Freq: Every day | ORAL | Status: DC

## 2017-08-14 MED ORDER — SODIUM CHLORIDE 0.9 % IV SOLN
500.0000 mg | INTRAVENOUS | Status: DC
  Administered 2017-08-14: 500 mg via INTRAVENOUS
  Filled 2017-08-14: qty 500

## 2017-08-14 NOTE — ED Notes (Addendum)
Pt requested to take Bipap off and have his nasal cannula put back on. 2L

## 2017-08-14 NOTE — Progress Notes (Addendum)
CSW consult- Admitted from Facility.  CSW will assist with patient return to [name] when medically stable.  FL2 will be completed.   Kathrin Greathouse, Marlinda Mike, MSW Clinical Social Worker  [phone] 08/14/2017  1:40 PM
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
## 2017-08-14 NOTE — ED Notes (Signed)
Pharmacy contacted about rescheduling medications due to holding pt in ED

## 2017-08-14 NOTE — Consult Note (Signed)
Consultation Note Date: 08/14/2017   Patient Name: [name]  DOB: 1960/03/20  MRN: [id number]  Age / Sex: 57 y.o., male  PCP: [name], MD Referring Physician: Jani Gravel, MD  Reason for Consultation: Establishing goals of care  HPI/Patient Profile: 57 y.o. male  admitted on 08/13/2017   Clinical Assessment and Goals of Care:  57 year old gentleman with stage IV non-small cell lung cancer, pulmonary embolism, chronic obstructive pulmonary disease requires home oxygen. Patient lives at assisted living facility.  Patient is known to the palliative service. He has had multiple recurrent hospitalizations. He has been seen and evaluated by several palliative providers. Goals of care discussions have been undertaken. Throughout these past several hospitalizations, patient has not been willing to discuss further about hospice philosophy of care.  Patient has returned to the emergency department and is again readmitted to hospital medicine service after he was last discharged from the hospital for COPD exacerbation for his ago. Currently admitted to stepdown unit at [facility] for acute on chronic respiratory failure with elements of both hypoxia as well as hypercapnia, COPD exacerbation. Patient has not been able to continue with chemotherapy recently because of recurrent hospitalizations. Additionally, also noted to have hypernatremia, chronic combined congestive heart failure.  Repeat palliative consult has been requested for ongoing goals of care discussions.  Patient is awake alert sitting up in bed. As soon as I walk into the room he tells me he is not receiving enough air. He appears visibly anxious. He becomes restless and moves around in bed. Bedside RN also present. Patient was recently given his Klonopin when necessary. We discussed about medication and non medication management  techniques for anxiety.   There is no family present at the bedside. Will review patient's medication list and work towards appropriate symptom management initially. Agree that patient will immensely benefit from addition of hospice as an extra layer of support. Please note additional recommendations below. Thank you for the consult.  NEXT OF KIN Mother Tadao Emig is noted to be next of kin [phone]   SUMMARY OF RECOMMENDATIONS   Agree with DNR DNI.  Add Ativan IV PRN for anxiety, air hunger. Also increase dose of Klonopin PO PRN.  Will initiate hospice discussions with patient when he is able to participate better. Currently, the patient is at [facility]. It will benefit him immensely to have hospice services as an extra layer of support over there.  Thank you for the consult.   Code Status/Advance Care Planning:  DNR    Symptom Management:      Palliative Prophylaxis:   Bowel Regimen   Psycho-social/Spiritual:   Desire for further Chaplaincy support:yes  Additional Recommendations: Education on Hospice  Prognosis:   < 6 months  Discharge Planning: ALF with hospice is recommended at the time of discharge.       Primary Diagnoses: Present on Admission: . Adenocarcinoma of left lung, stage 4 ([place]) . COPD exacerbation ([place]) . Chronic combined systolic (congestive) and diastolic (congestive)  heart failure ([place]) . CKD (chronic kidney disease), stage III ([place]) . Acute on chronic respiratory failure with hypoxia and hypercapnia ([facility])   I have reviewed the medical record, interviewed the patient and family, and examined the patient. The following aspects are pertinent.  Past Medical History:  Diagnosis Date  . Abdominal pain 06/04/2016  . Adenocarcinoma of left lung, stage 4 ([place]) 05/02/2016  . Alcohol abuse   . Bronchitis due to tobacco use ([place])   . Cancer ([place])    Lung  . COPD (chronic obstructive pulmonary disease) ([place])     on home o2 2LNC   . Dehydration 06/04/2016  . Encounter for antineoplastic chemotherapy 05/02/2016  . Gastritis   . Goals of care, counseling/discussion 05/02/2016  . Hematemesis   . HTN (hypertension) 10/30/2016  . Pulmonary embolism ([place]) 06/30/2017   on CTA chest  . Recurrent upper respiratory infection (URI)   . Seizures ([place]) 05/2011   new onset   Social History   Socioeconomic History  . Marital status: Single    Spouse name: Not on file  . Number of children: Not on file  . Years of education: Not on file  . Highest education level: Not on file  Occupational History  . Not on file  Social Needs  . Financial resource strain: Not on file  . Food insecurity:    Worry: Not on file    Inability: Not on file  . Transportation needs:    Medical: Not on file    Non-medical: Not on file  Tobacco Use  . Smoking status: Current Some Day Smoker    Packs/day: 0.10    Years: 30.00    Pack years: 3.00    Types: Cigarettes  . Smokeless tobacco: Never Used  . Tobacco comment: 1 cig a week  Substance and Sexual Activity  . Alcohol use: No    Frequency: Never  . Drug use: No  . Sexual activity: Yes  Lifestyle  . Physical activity:    Days per week: Not on file    Minutes per session: Not on file  . Stress: Not on file  Relationships  . Social connections:    Talks on phone: Not on file    Gets together: Not on file    Attends religious service: Not on file    Active member of club or organization: Not on file    Attends meetings of clubs or organizations: Not on file    Relationship status: Not on file  Other Topics Concern  . Not on file  Social History Narrative  . Not on file   Family History  Problem Relation Age of Onset  . Cancer Father   . Diabetes Mellitus II Mother    Scheduled Meds: . apixaban  5 mg Oral BID  . chlorpheniramine-HYDROcodone  5 mL Oral Q12H  . escitalopram  5 mg Oral QHS  . ferrous sulfate  325 mg Oral BID WC  . fluticasone  furoate-vilanterol  1 puff Inhalation Daily   And  . umeclidinium bromide  1 puff Inhalation Daily  . folic acid  1 mg Oral Daily  . gabapentin  100 mg Oral TID  . guaiFENesin  1,200 mg Oral BID  . guaiFENesin-dextromethorphan  5 mL Oral Q12H  . ipratropium-albuterol  3 mL Nebulization QID  . ipratropium-albuterol      . loratadine  10 mg Oral Daily  . metoprolol tartrate  12.5 mg Oral BID  . pantoprazole  40 mg Oral  BID  . polyethylene glycol  17 g Oral Daily  . predniSONE  40 mg Oral Q breakfast  . senna-docusate  2 tablet Oral BID  . sucralfate  1 g Oral TID WC & HS   Continuous Infusions: . dextrose 70 mL/hr at 08/14/17 0141   PRN Meds:.acetaminophen **OR** acetaminophen, albuterol, clonazePAM, hydrALAZINE, hydrOXYzine, LORazepam, ondansetron **OR** ondansetron (ZOFRAN) IV, oxyCODONE Medications Prior to Admission:  Prior to Admission medications   Medication Sig Start Date End Date Taking? Authorizing Provider  albuterol (PROVENTIL) (2.5 MG/3ML) 0.083% nebulizer solution 1 neb every 4-6 hours as needed for wheezing and shortness of breath Patient taking differently: Take 2.5 mg by nebulization every 6 (six) hours as needed for wheezing or shortness of breath.  04/01/17  Yes Parrett, Fonnie Mu, NP  apixaban (ELIQUIS) 5 MG TABS tablet Take 5 mg by mouth 2 (two) times daily.   Yes [provider]  Dextromethorphan-guaiFENesin (TUSSIN DM) 10-100 MG/5ML liquid Take 5 mLs by mouth every 12 (twelve) hours.   Yes [provider]  escitalopram (LEXAPRO) 5 MG tablet Take 1 tablet (5 mg total) by mouth at bedtime. 04/17/17  Yes Rosita Fire, MD  ferrous sulfate 325 (65 FE) MG tablet Take 1 tablet (325 mg total) by mouth 2 (two) times daily with a meal. 07/12/17  Yes Lavina Hamman, MD  Fluticasone-Umeclidin-Vilant (TRELEGY ELLIPTA) 100-62.5-25 MCG/INH AEPB Inhale 1 puff into the lungs daily. 05/31/17  Yes Aline August, MD  folic acid (FOLVITE) 1 MG tablet Take 1  tablet (1 mg total) by mouth daily. 07/13/17  Yes Lavina Hamman, MD  furosemide (LASIX) 40 MG tablet Take 1 tablet (40 mg total) by mouth 2 (two) times daily. Patient taking differently: Take 40 mg by mouth daily.  08/09/17  Yes Lavina Hamman, MD  gabapentin (NEURONTIN) 100 MG capsule Take 1 capsule (100 mg total) by mouth 3 (three) times daily. 07/12/17  Yes Lavina Hamman, MD  guaiFENesin (MUCINEX) 600 MG 12 hr tablet Take 1,200 mg by mouth 2 (two) times daily.    Yes [provider]  Hydrocod Polst-Chlorphen Polst [facility] PENNKINETIC ER PO) Take 5 mLs by mouth every 12 (twelve) hours.   Yes [provider]  hydrOXYzine (ATARAX/VISTARIL) 10 MG tablet Take 1 tablet (10 mg total) by mouth 3 (three) times daily as needed for anxiety. 07/25/17  Yes Donne Hazel, MD  ipratropium-albuterol (DUONEB) 0.5-2.5 (3) MG/3ML SOLN Take 3 mLs by nebulization 4 (four) times daily. 07/28/17  Yes Lavina Hamman, MD  loratadine (CLARITIN) 10 MG tablet Take 10 mg by mouth daily.   Yes [provider]  metoprolol tartrate (LOPRESSOR) 25 MG tablet Take 12.5 mg by mouth 2 (two) times daily.    Yes [provider]  Multiple Vitamin (THERA-TABS PO) Take 1 tablet by mouth daily.   Yes [provider]  Oxycodone HCl 10 MG TABS Take 1 tablet (10 mg total) by mouth every 4 (four) hours as needed (pain). 08/13/17  Yes Curt Bears, MD  pantoprazole (PROTONIX) 40 MG tablet Take 1 tablet (40 mg total) by mouth 2 (two) times daily. 12/18/16  Yes Eugenie Filler, MD  polyethylene glycol [facility] / Floria Raveling) packet Take 17 g by mouth daily. 07/29/17  Yes Lavina Hamman, MD  potassium chloride (K-DUR) 10 MEQ tablet Take 2 tablets (20 mEq total) by mouth daily. 07/28/17  Yes Lavina Hamman, MD  senna-docusate (SENOKOT-S) 8.6-50 MG tablet Take 2 tablets by mouth 2 (  two) times daily. 07/12/17  Yes Lavina Hamman, MD  sucralfate (CARAFATE) 1 GM/10ML suspension Take 10 mLs (1 g total)  by mouth 4 (four) times daily -  with meals and at bedtime. 07/12/17  Yes Lavina Hamman, MD  acetaminophen (TYLENOL) 500 MG tablet Take 1 tablet (500 mg total) by mouth 2 (two) times daily as needed for mild pain. 07/07/17   Florencia Reasons, MD  Chlorphen-Phenyleph-ASA (ALKA-SELTZER PLUS COLD PO) Take 2 tablets by mouth at bedtime as needed (COUGH).    [provider]  clonazePAM (KLONOPIN) 0.5 MG tablet Take 0.25 mg by mouth 2 (two) times daily as needed for anxiety.    [provider]  ondansetron (ZOFRAN-ODT) 4 MG disintegrating tablet Take 4 mg by mouth every 8 (eight) hours as needed for nausea or vomiting.    [provider]   No Known Allergies Review of Systems +pain +anxiety +shortness of breath  Physical Exam Weak appearing, older than stated age appearing gentleman resting in bed Tachypneic Visibly anxious S1-S2 No edema Some degree of muscle wasting is evident No abdominal distention or tenderness  Vital Signs: BP (!) 181/101   Pulse 81   Temp 97.6 F (36.4 C) (Oral)   Resp 17   Ht 5\' 11"  (1.803 m)   Wt 52.6 kg (115 lb 15.4 oz)   SpO2 100%   BMI 16.17 kg/m  Pain Scale: 0-10   Pain Score: 8    SpO2: SpO2: 100 % O2 Device:SpO2: 100 % O2 Flow Rate: .O2 Flow Rate (L/min): 4 L/min  IO: Intake/output summary:   Intake/Output Summary (Last 24 hours) at 08/14/2017 1032 Last data filed at 08/14/2017 0915 Gross per 24 hour  Intake 290 ml  Output 200 ml  Net 90 ml    LBM: Last BM Date: 08/13/17 Baseline Weight: Weight: 52.6 kg (115 lb 15.4 oz) Most recent weight: Weight: 52.6 kg (115 lb 15.4 oz)     Palliative Assessment/Data:   PPS 30%  Time In:  11 Time Out:  12 Time Total:  60  Greater than 50%  of this time was spent counseling and coordinating care related to the above assessment and plan.  Signed by: Loistine Chance, MD  [phone]  Please contact Palliative Medicine Team phone at [phone] for questions and concerns.  For  individual provider: See Shea Evans

## 2017-08-14 NOTE — ED Notes (Signed)
Pt is becoming increasingly agitated that he has not gotten moved upstairs yet and is requesting something for pain.
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
## 2017-08-14 NOTE — ED Notes (Signed)
Called Resp. For Dr Alcario Drought @2333 

## 2017-08-14 NOTE — Progress Notes (Signed)
Initial Nutrition Assessment  DOCUMENTATION CODES:   Severe malnutrition in context of chronic illness, Underweight  INTERVENTION:  - Will order Ensure Enlive BID, each supplement provides 350 kcal and 20 grams of protein. - Will order 30 mL Prostat once/day, this supplement provides 100 kcal and 15 grams of protein. - Continue to encourage PO intakes.    NUTRITION DIAGNOSIS:   Severe Malnutrition related to chronic illness, catabolic illness, cancer and cancer related treatments as evidenced by severe fat depletion, severe muscle depletion.  GOAL:   Patient will meet greater than or equal to 90% of their needs, Weight gain  MONITOR:   PO intake, Supplement acceptance, Weight trends, Labs  REASON FOR ASSESSMENT:   Consult COPD Protocol  ASSESSMENT:   56 y.o. male with medical history significant of Stage 4 NSCLC, PE, COPD on home O2.  Patient has not been doing well from a respiratory standpoint recently.  Has required admission 4x so far this month, and 9x in the past 2 months, not counting today.  He is DNI/DNR, but thus far has not desired to progress to hospice.  Information outlined above from H&P. Patient is well known to this RD from previous admissions. He reports ongoing abdominal pain and nausea which are chronic in nature--he reports 10 or more months. He states that these symptoms affect his desire and ability to eat. Per review of flow sheet, patient has mainly consumed 75-100% of meals during hospitalizations this month. He reports not liking the food at the facility where he lives and not eating well there.   NFPE outlined below and is consistent with findings during other admissions this month. Per chart review, weight has mainly been stable over the past 1 month.  Per Dr. Juleen China note/H&P late last night: acute on chronic respiratory failure with hypoxia and hypercapnia d/t COPD exacerbation and stage 4 NSLC.   Medications reviewed; 325 mg ferrous sulfate  BID, 1 mg oral folic acid/day, 2 mg Mg sulfate x1 run yesterday, 125 mg Solu-medrol x1 yesterday, 80 mg Solu-medrol TID, 1 packet Miralax/day, 2 tablets Senokot BID, 1 g Carafate TID. Labs reviewed; Cl: 97 mmol/L, BUN: 28 mg/dL, creatinine: 1.47 mg/dL, GFR: 59 mL/min.   IVF: D5 @ 70 mL/hr (286 kcal)    NUTRITION - FOCUSED PHYSICAL EXAM:    Most Recent Value  Orbital Region  Mild depletion  Upper Arm Region  Severe depletion  Thoracic and Lumbar Region  Severe depletion  Buccal Region  Moderate depletion  Temple Region  Moderate depletion  Clavicle Bone Region  Severe depletion  Clavicle and Acromion Bone Region  Severe depletion  Scapular Bone Region  Unable to assess  Dorsal Hand  Moderate depletion  Patellar Region  Moderate depletion  Anterior Thigh Region  Unable to assess  Posterior Calf Region  Severe depletion  Edema (RD Assessment)  Mild [BLE]  Hair  Reviewed  Eyes  Reviewed  Mouth  Reviewed  Skin  Reviewed  Nails  Reviewed       Diet Order:   Diet Order           Diet regular Room service appropriate? Yes; Fluid consistency: Thin  Diet effective now          EDUCATION NEEDS:   No education needs have been identified at this time  Skin:  Skin Assessment: Reviewed RN Assessment  Last BM:  7/24  Height:   Ht Readings from Last 1 Encounters:  08/14/17 5\' 11"  (1.803 m)    Weight:  Wt Readings from Last 1 Encounters:  08/14/17 115 lb 15.4 oz (52.6 kg)    Ideal Body Weight:  78.18 kg  BMI:  Body mass index is 16.17 kg/m.  Estimated Nutritional Needs:   Kcal:  1840-2105 (35-40 kcal/kg)  Protein:  95-105 grams (1.8-2 grams/kg)  Fluid:  >/= 1.8 L/day     Jarome Matin, MS, RD, LDN, [facility] Inpatient Clinical Dietitian Pager # [phone] After hours/weekend pager # [phone]

## 2017-08-15 DIAGNOSIS — J441 Chronic obstructive pulmonary disease with (acute) exacerbation: Principal | ICD-10-CM

## 2017-08-15 LAB — BASIC METABOLIC PANEL
ANION GAP: 10 (ref 5–15)
BUN: 21 mg/dL — ABNORMAL HIGH (ref 6–20)
CHLORIDE: 93 mmol/L — AB (ref 98–111)
CO2: 35 mmol/L — AB (ref 22–32)
Calcium: 9.1 mg/dL (ref 8.9–10.3)
Creatinine, Ser: 1.47 mg/dL — ABNORMAL HIGH (ref 0.61–1.24)
GFR calc non Af Amer: 51 mL/min — ABNORMAL LOW (ref >60)
GFR, EST AFRICAN AMERICAN: 59 mL/min — AB (ref >60)
Glucose, Bld: 317 mg/dL — ABNORMAL HIGH (ref 70–99)
Potassium: 4 mmol/L (ref 3.5–5.1)
Sodium: 138 mmol/L (ref 135–145)

## 2017-08-15 LAB — COMPREHENSIVE METABOLIC PANEL
ALK PHOS: 96 U/L (ref 38–126)
ALT: 17 U/L (ref 0–44)
AST: 19 U/L (ref 15–41)
Albumin: 3.4 g/dL — ABNORMAL LOW (ref 3.5–5.0)
Anion gap: 10 (ref 5–15)
BILIRUBIN TOTAL: 0.4 mg/dL (ref 0.3–1.2)
BUN: 19 mg/dL (ref 6–20)
CALCIUM: 9 mg/dL (ref 8.9–10.3)
CO2: 36 mmol/L — AB (ref 22–32)
Chloride: 91 mmol/L — ABNORMAL LOW (ref 98–111)
Creatinine, Ser: 1.31 mg/dL — ABNORMAL HIGH (ref 0.61–1.24)
GFR calc non Af Amer: 58 mL/min — ABNORMAL LOW (ref >60)
Glucose, Bld: 333 mg/dL — ABNORMAL HIGH (ref 70–99)
Potassium: 4.1 mmol/L (ref 3.5–5.1)
SODIUM: 137 mmol/L (ref 135–145)
TOTAL PROTEIN: 6 g/dL — AB (ref 6.5–8.1)

## 2017-08-15 LAB — CBC
HEMATOCRIT: 24.2 % — AB (ref 39.0–52.0)
HEMOGLOBIN: 7.7 g/dL — AB (ref 13.0–17.0)
MCH: 33.2 pg (ref 26.0–34.0)
MCHC: 31.8 g/dL (ref 30.0–36.0)
MCV: 104.3 fL — AB (ref 78.0–100.0)
Platelets: 222 10*3/uL (ref 150–400)
RBC: 2.32 MIL/uL — AB (ref 4.22–5.81)
RDW: 19.4 % — ABNORMAL HIGH (ref 11.5–15.5)
WBC: 11.5 10*3/uL — ABNORMAL HIGH (ref 4.0–10.5)

## 2017-08-15 LAB — GLUCOSE, CAPILLARY
GLUCOSE-CAPILLARY: 133 mg/dL — AB (ref 70–99)
Glucose-Capillary: 148 mg/dL — ABNORMAL HIGH (ref 70–99)

## 2017-08-15 MED ORDER — METOPROLOL TARTRATE 12.5 MG HALF TABLET
12.5000 mg | ORAL_TABLET | Freq: Once | ORAL | Status: AC
  Administered 2017-08-15: 12.5 mg via ORAL
  Filled 2017-08-15: qty 1

## 2017-08-15 MED ORDER — INSULIN ASPART 100 UNIT/ML ~~LOC~~ SOLN: 0.0000 [IU] | Freq: Three times a day (TID) | SUBCUTANEOUS | Status: DC

## 2017-08-15 MED ORDER — SODIUM CHLORIDE 0.9% FLUSH
10.0000 mL | INTRAVENOUS | Status: DC | PRN
  Administered 2017-08-17: 10 mL
  Filled 2017-08-15: qty 40

## 2017-08-15 MED ORDER — PREDNISONE 20 MG PO TABS: 60.0000 mg | ORAL_TABLET | Freq: Every day | ORAL | Status: DC

## 2017-08-15 MED ORDER — METHYLPREDNISOLONE SODIUM SUCC 40 MG IJ SOLR
40.0000 mg | Freq: Three times a day (TID) | INTRAMUSCULAR | Status: DC
  Administered 2017-08-15 (×2): 40 mg via INTRAVENOUS
  Filled 2017-08-15 (×2): qty 1

## 2017-08-15 MED ORDER — AMLODIPINE BESYLATE 5 MG PO TABS
2.5000 mg | ORAL_TABLET | Freq: Every day | ORAL | Status: DC
  Administered 2017-08-15 – 2017-08-17 (×3): 2.5 mg via ORAL
  Filled 2017-08-15 (×3): qty 1

## 2017-08-15 MED ORDER — ORAL CARE MOUTH RINSE
15.0000 mL | Freq: Two times a day (BID) | OROMUCOSAL | Status: DC
  Administered 2017-08-15: 15 mL via OROMUCOSAL

## 2017-08-15 MED ORDER — METOPROLOL TARTRATE 25 MG PO TABS
25.0000 mg | ORAL_TABLET | Freq: Two times a day (BID) | ORAL | Status: DC
  Administered 2017-08-16: 25 mg via ORAL
  Filled 2017-08-15: qty 1

## 2017-08-15 MED ORDER — CHLORHEXIDINE GLUCONATE 0.12 % MT SOLN
15.0000 mL | Freq: Two times a day (BID) | OROMUCOSAL | Status: DC
  Administered 2017-08-15: 15 mL via OROMUCOSAL
  Filled 2017-08-15 (×2): qty 15

## 2017-08-15 MED ORDER — PROMETHAZINE HCL 25 MG/ML IJ SOLN
12.5000 mg | Freq: Four times a day (QID) | INTRAMUSCULAR | Status: AC | PRN
  Administered 2017-08-15 – 2017-08-16 (×2): 12.5 mg via INTRAVENOUS
  Filled 2017-08-15 (×2): qty 1

## 2017-08-15 MED ORDER — AZITHROMYCIN 250 MG PO TABS
500.0000 mg | ORAL_TABLET | Freq: Every day | ORAL | Status: DC
  Administered 2017-08-15 – 2017-08-17 (×3): 500 mg via ORAL
  Filled 2017-08-15 (×3): qty 2

## 2017-08-15 MED ORDER — SODIUM CHLORIDE 0.9% FLUSH
10.0000 mL | Freq: Two times a day (BID) | INTRAVENOUS | Status: DC
  Administered 2017-08-15 – 2017-08-16 (×4): 10 mL

## 2017-08-15 MED ORDER — FUROSEMIDE 10 MG/ML IJ SOLN
40.0000 mg | Freq: Once | INTRAMUSCULAR | Status: AC
  Administered 2017-08-15: 40 mg via INTRAVENOUS
  Filled 2017-08-15: qty 4

## 2017-08-15 MED ORDER — CHLORHEXIDINE GLUCONATE CLOTH 2 % EX PADS
6.0000 | MEDICATED_PAD | Freq: Every day | CUTANEOUS | Status: DC
  Administered 2017-08-15 – 2017-08-16 (×2): 6 via TOPICAL

## 2017-08-15 MED ORDER — INSULIN ASPART 100 UNIT/ML ~~LOC~~ SOLN: 0.0000 [IU] | Freq: Every day | SUBCUTANEOUS | Status: DC

## 2017-08-15 NOTE — Progress Notes (Signed)
PHARMACIST - PHYSICIAN COMMUNICATION CONCERNING: Antibiotic IV to Oral Route Change Policy  RECOMMENDATION: This patient is receiving azithromycin by the intravenous route.  Based on criteria approved by the Pharmacy and Therapeutics Committee, the antibiotic(s) is/are being converted to the equivalent oral dose form(s).   DESCRIPTION: These criteria include:  Patient being treated for a respiratory tract infection, urinary tract infection, cellulitis or clostridium difficile associated diarrhea if on metronidazole  The patient is not neutropenic and does not exhibit a GI malabsorption state  The patient is eating (either orally or via tube) and/or has been taking other orally administered medications for a least 24 hours  The patient is improving clinically and has a Tmax < 100.5  If you have questions about this conversion, please contact the Pharmacy Department  []   [phone] )  [name] []   [phone] )  [name] []   [phone] )  [name] []   [phone] )  [facility] [x]   [phone] )  [place], [place].D 08/15/2017 11:25 AM

## 2017-08-15 NOTE — Progress Notes (Signed)
Inpatient Diabetes Program Recommendations  AACE/ADA: New Consensus Statement on Inpatient Glycemic Control (2015)  Target Ranges:  Prepandial:   less than 140 mg/dL      Peak postprandial:   less than 180 mg/dL (1-2 hours)      Critically ill patients:  140 - 180 mg/dL   Results for [name], [name] (MRN [id number]) as of 08/15/2017 11:02  Ref. Range 08/14/2017 05:28 08/14/2017 12:00 08/14/2017 18:48 08/14/2017 23:00 08/15/2017 08:34  Glucose Latest Ref Range: 70 - 99 mg/dL 210 (H) 131 (H) 232 (H) 317 (H) 333 (H)    Admit with: SOB  NO History of DM noted.    History of Stage 4 Lung Cancer, COPD.     Patient given 125 mg Solumedrol X 1 dose yesterday at 4pm.  Now getting Solumedrol 40 mg Q8 hours.  Lab glucose levels elevated since yesterday.     MD- Please consider placing orders for Novolog Sensitive Correction Scale/ SSI (0-9 units) TID AC + HS      --Will follow patient during hospitalization--  Wyn Quaker RN, MSN, CDE Diabetes Coordinator Inpatient Glycemic Control Team Team Pager: [phone] (8a-5p)
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
## 2017-08-16 ENCOUNTER — Inpatient Hospital Stay (HOSPITAL_COMMUNITY): Payer: Medicaid Other

## 2017-08-16 LAB — GLUCOSE, CAPILLARY
GLUCOSE-CAPILLARY: 149 mg/dL — AB (ref 70–99)
Glucose-Capillary: 129 mg/dL — ABNORMAL HIGH (ref 70–99)
Glucose-Capillary: 132 mg/dL — ABNORMAL HIGH (ref 70–99)

## 2017-08-16 LAB — CBC
HCT: 27.2 % — ABNORMAL LOW (ref 39.0–52.0)
HEMOGLOBIN: 8.5 g/dL — AB (ref 13.0–17.0)
MCH: 33.2 pg (ref 26.0–34.0)
MCHC: 31.3 g/dL (ref 30.0–36.0)
MCV: 106.3 fL — AB (ref 78.0–100.0)
Platelets: 275 10*3/uL (ref 150–400)
RBC: 2.56 MIL/uL — AB (ref 4.22–5.81)
RDW: 19.8 % — ABNORMAL HIGH (ref 11.5–15.5)
WBC: 20 10*3/uL — AB (ref 4.0–10.5)

## 2017-08-16 LAB — COMPREHENSIVE METABOLIC PANEL
ALK PHOS: 98 U/L (ref 38–126)
ALT: 16 U/L (ref 0–44)
AST: 17 U/L (ref 15–41)
Albumin: 3.5 g/dL (ref 3.5–5.0)
Anion gap: 9 (ref 5–15)
BUN: 23 mg/dL — AB (ref 6–20)
CALCIUM: 9.7 mg/dL (ref 8.9–10.3)
CHLORIDE: 88 mmol/L — AB (ref 98–111)
CO2: 43 mmol/L — AB (ref 22–32)
CREATININE: 1.45 mg/dL — AB (ref 0.61–1.24)
GFR calc Af Amer: 60 mL/min — ABNORMAL LOW (ref >60)
GFR calc non Af Amer: 52 mL/min — ABNORMAL LOW (ref >60)
Glucose, Bld: 313 mg/dL — ABNORMAL HIGH (ref 70–99)
Potassium: 3.8 mmol/L (ref 3.5–5.1)
SODIUM: 140 mmol/L (ref 135–145)
Total Bilirubin: 0.4 mg/dL (ref 0.3–1.2)
Total Protein: 6.5 g/dL (ref 6.5–8.1)

## 2017-08-16 MED ORDER — METOCLOPRAMIDE HCL 5 MG/ML IJ SOLN
5.0000 mg | Freq: Four times a day (QID) | INTRAMUSCULAR | Status: AC
  Administered 2017-08-16 – 2017-08-17 (×2): 5 mg via INTRAVENOUS
  Filled 2017-08-16 (×2): qty 2

## 2017-08-16 MED ORDER — METOPROLOL TARTRATE 50 MG PO TABS
50.0000 mg | ORAL_TABLET | Freq: Two times a day (BID) | ORAL | Status: DC
  Administered 2017-08-16 – 2017-08-17 (×2): 50 mg via ORAL
  Filled 2017-08-16 (×2): qty 1

## 2017-08-16 MED ORDER — PREDNISONE 50 MG PO TABS
60.0000 mg | ORAL_TABLET | Freq: Every day | ORAL | Status: DC
  Administered 2017-08-16 – 2017-08-17 (×2): 60 mg via ORAL
  Filled 2017-08-16: qty 3
  Filled 2017-08-16: qty 1

## 2017-08-16 NOTE — Progress Notes (Signed)
In room with patient when patient began talking to himself. Asked patient who he was talking to, he said his brother. No one in room with patient other than nursing staff. Pt otherwise alert and oriented. Dr. Maudie Mercury notified of hallucinations. 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 Pt went down and was subsequently brought up due to his inability to lay down flat for procedure. Dr. Maudie Mercury notified via text page that MRI was not completed.

## 2017-08-16 NOTE — Plan of Care (Signed)
Patient transferred to [facility] from ICU, VSS other than mildly elevated BP for which medications have been adjusted.  Patient maintains oxygen saturation in the high 90's on 3 liters which is his baseline.  Continues to refuse insulin although he does let staff check his blood sugar.

## 2017-08-16 NOTE — Progress Notes (Signed)
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 Benign-appearing abdomen. Loculated small bibasilar pleural effusions. Electronically Signed   By: Lorriane Shire M.D.   On: 07/28/2017 08:55    Time Spent in minutes  30   Jani Gravel M.D on 08/16/2017 at 6:47 AM  Between 7am to 7pm - Pager - [phone]    After 7pm go to [email or web address] - password [facility]  [facility] -  Office  [phone]

## 2017-08-16 NOTE — Progress Notes (Signed)
Patient refused his medications tonight, states " they are making me feel worse." Vomited times one,  phenergan given per order. Request PRN  Breathing treatment and then  Stated  " just let me rest. "

## 2017-08-17 LAB — CBC
HCT: 25.1 % — ABNORMAL LOW (ref 39.0–52.0)
Hemoglobin: 7.9 g/dL — ABNORMAL LOW (ref 13.0–17.0)
MCH: 33.6 pg (ref 26.0–34.0)
MCHC: 31.5 g/dL (ref 30.0–36.0)
MCV: 106.8 fL — AB (ref 78.0–100.0)
PLATELETS: 262 10*3/uL (ref 150–400)
RBC: 2.35 MIL/uL — ABNORMAL LOW (ref 4.22–5.81)
RDW: 19.7 % — AB (ref 11.5–15.5)
WBC: 18.2 10*3/uL — ABNORMAL HIGH (ref 4.0–10.5)

## 2017-08-17 LAB — COMPREHENSIVE METABOLIC PANEL
ALT: 14 U/L (ref 0–44)
ANION GAP: 12 (ref 5–15)
AST: 17 U/L (ref 15–41)
Albumin: 3.2 g/dL — ABNORMAL LOW (ref 3.5–5.0)
Alkaline Phosphatase: 98 U/L (ref 38–126)
BILIRUBIN TOTAL: 0.4 mg/dL (ref 0.3–1.2)
BUN: 24 mg/dL — AB (ref 6–20)
CHLORIDE: 90 mmol/L — AB (ref 98–111)
CO2: 41 mmol/L — ABNORMAL HIGH (ref 22–32)
Calcium: 9.5 mg/dL (ref 8.9–10.3)
Creatinine, Ser: 1.51 mg/dL — ABNORMAL HIGH (ref 0.61–1.24)
GFR, EST AFRICAN AMERICAN: 57 mL/min — AB (ref >60)
GFR, EST NON AFRICAN AMERICAN: 49 mL/min — AB (ref >60)
Glucose, Bld: 154 mg/dL — ABNORMAL HIGH (ref 70–99)
POTASSIUM: 3.7 mmol/L (ref 3.5–5.1)
Sodium: 143 mmol/L (ref 135–145)
TOTAL PROTEIN: 6.1 g/dL — AB (ref 6.5–8.1)

## 2017-08-17 LAB — GLUCOSE, CAPILLARY
GLUCOSE-CAPILLARY: 183 mg/dL — AB (ref 70–99)
Glucose-Capillary: 127 mg/dL — ABNORMAL HIGH (ref 70–99)

## 2017-08-17 MED ORDER — PRO-STAT SUGAR FREE PO LIQD: 30.0000 mL | Freq: Every day | ORAL | 0 refills | Status: AC

## 2017-08-17 MED ORDER — HEPARIN SOD (PORK) LOCK FLUSH 100 UNIT/ML IV SOLN
500.0000 [IU] | INTRAVENOUS | Status: AC | PRN
  Administered 2017-08-17: 500 [IU]

## 2017-08-17 MED ORDER — INSULIN ASPART 100 UNIT/ML ~~LOC~~ SOLN: SUBCUTANEOUS | 11 refills | Status: DC

## 2017-08-17 MED ORDER — METOCLOPRAMIDE HCL 5 MG PO TABS: 5.0000 mg | ORAL_TABLET | Freq: Three times a day (TID) | ORAL | 1 refills | Status: AC

## 2017-08-17 MED ORDER — ONDANSETRON HCL 4 MG PO TABS: 4.0000 mg | ORAL_TABLET | Freq: Four times a day (QID) | ORAL | 0 refills | Status: AC | PRN

## 2017-08-17 MED ORDER — ALBUTEROL SULFATE (2.5 MG/3ML) 0.083% IN NEBU: 2.5000 mg | INHALATION_SOLUTION | Freq: Four times a day (QID) | RESPIRATORY_TRACT | 1 refills | Status: DC | PRN

## 2017-08-17 MED ORDER — AMLODIPINE BESYLATE 2.5 MG PO TABS: 2.5000 mg | ORAL_TABLET | Freq: Every day | ORAL | 0 refills | Status: DC

## 2017-08-17 MED ORDER — ENSURE ENLIVE PO LIQD: 237.0000 mL | Freq: Three times a day (TID) | ORAL | 12 refills | Status: AC

## 2017-08-17 MED ORDER — AZITHROMYCIN 250 MG PO TABS: 250.0000 mg | ORAL_TABLET | Freq: Every day | ORAL | 0 refills | Status: DC

## 2017-08-17 MED ORDER — PREDNISONE 20 MG PO TABS: ORAL_TABLET | ORAL | 0 refills | Status: DC

## 2017-08-17 MED ORDER — METOPROLOL TARTRATE 50 MG PO TABS: 50.0000 mg | ORAL_TABLET | Freq: Two times a day (BID) | ORAL | 0 refills | Status: DC
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
## 2017-08-17 NOTE — NC FL2 (Signed)
[place] LEVEL OF CARE SCREENING TOOL     IDENTIFICATION  Patient Name: [name] Birthdate: 04/17/1960 Sex: male Admission Date (Current Location): 08/13/2017  [name] and [place] Number:  Herbalist and Address:  [facility],  [place] [address] [place]      Provider Number: [id number]  Attending Physician Name and Address:  Jani Gravel, MD  Relative Name and Phone Number:       Current Level of Care: Hospital Recommended Level of Care: [place] Prior Approval Number:    Date Approved/Denied:   PASRR Number: [id number]  Discharge Plan: Other (Comment)(Assisted Living Facility)    Current Diagnoses: Patient Active Problem List   Diagnosis Date Noted  . Acute on chronic respiratory failure with hypoxia and hypercapnia ([place]) 08/13/2017  . Protein-calorie malnutrition, severe 07/31/2017  . Thrombocytopenia ([place]) 07/29/2017  . Wheezing on exhalation 07/29/2017  . Anemia 07/24/2017  . Normocytic anemia 07/24/2017  . Chronic diastolic (congestive) heart failure ([place]) 07/24/2017  . Recurrent pleural effusion on right 07/15/2017  . Acute on chronic respiratory failure ([place]) 07/11/2017  . History of pulmonary embolism 07/11/2017  . PE (pulmonary thromboembolism) ([place]) 06/30/2017  . Pulmonary embolism ([place]) 06/30/2017  . AKI (acute kidney injury) ([place]) 06/25/2017  . Dyspnea 06/21/2017  . Chronic combined systolic (congestive) and diastolic (congestive) heart failure ([place])   . Constipation 06/07/2017  . Pain   . Acute on chronic respiratory failure with hypoxia ([place]) 05/28/2017  . Chronic respiratory failure with hypoxia ([place]) 04/01/2017  . Leucocytosis 03/16/2017  . CKD (chronic kidney disease), stage III ([place]) 03/12/2017  . Malnutrition of moderate degree 03/10/2017  . COPD exacerbation ([place]) 03/09/2017  . Macrocytic anemia 02/07/2017  . Severe malnutrition ([place]) 01/03/2017  . DOE (dyspnea on exertion)  12/30/2016  . Nausea   . Hypervolemia   . HCAP (healthcare-associated pneumonia) 12/19/2016  . SOB (shortness of breath) 12/19/2016  . Erosive gastropathy 12/18/2016  . Gastritis 12/18/2016  . Hematemesis 12/16/2016  . Intractable nausea and vomiting 12/16/2016  . HTN (hypertension) 10/30/2016  . Chronic obstructive pulmonary disease ([place]) 09/02/2016  . COPD (chronic obstructive pulmonary disease) ([place]) 08/19/2016  . Dehydration 06/04/2016  . Abdominal pain 06/04/2016  . 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 Adenocarcinoma of left lung, stage 4 ([place]) 05/02/2016  . Goals of care, counseling/discussion 05/02/2016  . Encounter for antineoplastic chemotherapy 05/02/2016  . Tobacco abuse 05/30/2011  . Alcohol abuse 05/30/2011  . Seizure ([place]) 05/28/2011    Orientation RESPIRATION BLADDER Height & Weight     Self, Time, Situation, Place  O2 Continent Weight: 115 lb 15.4 oz (52.6 kg) Height:  5\' 11"  (180.3 cm)  BEHAVIORAL SYMPTOMS/MOOD NEUROLOGICAL BOWEL NUTRITION STATUS      Continent Diet(regular)  AMBULATORY STATUS COMMUNICATION OF NEEDS Skin   Supervision Verbally Normal                       Personal Care Assistance Level of Assistance  Bathing, Feeding, Dressing Bathing Assistance: Limited assistance Feeding assistance: Independent Dressing Assistance: Limited assistance     Functional Limitations Info  Sight, Hearing, Speech Sight Info: Adequate Hearing Info: Adequate Speech Info: Adequate    SPECIAL CARE FACTORS FREQUENCY  PT (By licensed PT)     PT Frequency: HHPT              Contractures Contractures Info: Not present    Additional Factors Info  Code Status, Allergies Code Status  Info: DNR Allergies Info: NKA              Discharge Medications:  Allergies as of 08/17/2017   No Known Allergies        Medication List    STOP taking these medications   acetaminophen 500 MG tablet Commonly known as:  TYLENOL    ipratropium-albuterol 0.5-2.5 (3) MG/3ML Soln Commonly known as:  DUONEB   ondansetron 4 MG disintegrating tablet Commonly known as:  ZOFRAN-ODT   TUSSIN DM 10-100 MG/5ML liquid Generic drug:  Dextromethorphan-guaiFENesin     TAKE these medications   albuterol (2.5 MG/3ML) 0.083% nebulizer solution Commonly known as:  PROVENTIL 1 neb every 4-6 hours as needed for wheezing and shortness of breath What changed:    how much to take  how to take this  when to take this  reasons to take this  additional instructions   albuterol (2.5 MG/3ML) 0.083% nebulizer solution Commonly known as:  PROVENTIL Take 3 mLs (2.5 mg total) by nebulization every 6 (six) hours as needed for wheezing or shortness of breath. What changed:  You were already taking a medication with the same name, and this prescription was added. Make sure you understand how and when to take each.   ALKA-SELTZER PLUS COLD PO Take 2 tablets by mouth at bedtime as needed (COUGH).   amLODipine 2.5 MG tablet Commonly known as:  NORVASC Take 1 tablet (2.5 mg total) by mouth daily.   azithromycin 250 MG tablet Commonly known as:  ZITHROMAX Take 1 tablet (250 mg total) by mouth daily.   clonazePAM 0.5 MG tablet Commonly known as:  KLONOPIN Take 0.25 mg by mouth 2 (two) times daily as needed for anxiety.   ELIQUIS 5 MG Tabs tablet Generic drug:  apixaban Take 5 mg by mouth 2 (two) times daily.   escitalopram 5 MG tablet Commonly known as:  LEXAPRO Take 1 tablet (5 mg total) by mouth at bedtime.   feeding supplement (ENSURE ENLIVE) Liqd Take 237 mLs by mouth 3 (three) times daily between meals.   feeding supplement (PRO-STAT SUGAR FREE 64) Liqd Take 30 mLs by mouth daily.   ferrous sulfate 325 (65 FE) MG tablet Take 1 tablet (325 mg total) by mouth 2 (two) times daily with a meal.   Fluticasone-Umeclidin-Vilant 100-62.5-25 MCG/INH Aepb Commonly known as:  TRELEGY ELLIPTA Inhale 1 puff into  the lungs daily.   folic acid 1 MG tablet Commonly known as:  FOLVITE Take 1 tablet (1 mg total) by mouth daily.   furosemide 40 MG tablet Commonly known as:  LASIX Take 1 tablet (40 mg total) by mouth 2 (two) times daily. What changed:  when to take this   gabapentin 100 MG capsule Commonly known as:  NEURONTIN Take 1 capsule (100 mg total) by mouth 3 (three) times daily.   hydrOXYzine 10 MG tablet Commonly known as:  ATARAX/VISTARIL Take 1 tablet (10 mg total) by mouth 3 (three) times daily as needed for anxiety.   insulin aspart 100 UNIT/ML injection Commonly known as:  novoLOG Please inject  0-9 Units, Subcutaneous, 3 times daily with meals, First dose on Fri 08/15/17 at 1700 Correction coverage: Sensitive (thin, NPO, renal) CBG < 70: implement hypoglycemia protocol CBG 70 - 120: 0 units CBG 121 - 150: 1 unit CBG 151 - 200: 2 units CBG 201 - 250: 3 units CBG 251 - 300: 5 units CBG 301 - 350: 7 units CBG 351 - 400: 9 units CBG > 400:  call MD and obtain STAT lab verification   insulin aspart 100 UNIT/ML injection Commonly known as:  novoLOG 0-5 Units, Subcutaneous, Daily at bedtime, First dose on Fri 08/15/17 at 2200 Correction coverage: HS scale CBG < 70: implement hypoglycemia protocol CBG 70 - 120: 0 units CBG 121 - 150: 0 units CBG 151 - 200: 0 units CBG 201 - 250: 2 units CBG 251 - 300: 3 units CBG 301 - 350: 4 units CBG 351 - 400: 5 units CBG > 400: call MD and obtain STAT lab verification   loratadine 10 MG tablet Commonly known as:  CLARITIN Take 10 mg by mouth daily.   metoCLOPramide 5 MG tablet Commonly known as:  REGLAN Take 1 tablet (5 mg total) by mouth 3 (three) times daily.   metoprolol tartrate 50 MG tablet Commonly known as:  LOPRESSOR Take 1 tablet (50 mg total) by mouth 2 (two) times daily. What changed:    medication strength  how much to take   MUCINEX 600 MG 12 hr tablet Generic drug:  guaiFENesin Take 1,200 mg by  mouth 2 (two) times daily.   ondansetron 4 MG tablet Commonly known as:  ZOFRAN Take 1 tablet (4 mg total) by mouth every 6 (six) hours as needed for nausea.   Oxycodone HCl 10 MG Tabs Take 1 tablet (10 mg total) by mouth every 4 (four) hours as needed (pain).   pantoprazole 40 MG tablet Commonly known as:  PROTONIX Take 1 tablet (40 mg total) by mouth 2 (two) times daily.   polyethylene glycol packet Commonly known as:  MIRALAX / GLYCOLAX Take 17 g by mouth daily.   potassium chloride 10 MEQ tablet Commonly known as:  K-DUR Take 2 tablets (20 mEq total) by mouth daily.   predniSONE 20 MG tablet Commonly known as:  DELTASONE 60mg  po qday x 2 days then 50mg  po qday x 2 days then 40mg  po qday x 2 days then 30mg  po qday x 2 days then 20mg  po qday x 2 days then 10mg  po qday x 2 days.   senna-docusate 8.6-50 MG tablet Commonly known as:  Senokot-S Take 2 tablets by mouth 2 (two) times daily.   sucralfate 1 GM/10ML suspension Commonly known as:  CARAFATE Take 10 mLs (1 g total) by mouth 4 (four) times daily -  with meals and at bedtime.   THERA-TABS PO Take 1 tablet by mouth daily.   TUSSIONEX PENNKINETIC ER PO Take 5 mLs by mouth every 12 (twelve) hours.       Relevant Imaging Results:  Relevant Lab Results:   Additional Information SSN [id number]  [name], LCSW

## 2017-08-17 NOTE — Progress Notes (Signed)
CSW spoke with patient at bedside regarding discharge, patient confirmed plan to return to [name]   CSW contacted [name] and spoke with staff member Ivin Booty who confirmed patient's ability to return. CSW sent clinical documents to ALF.  [facility] contacted, patient declined for CSW to notify patient's family. Patient's RN can call report to [phone], packet complete. CSW signing off, no other needs identified at this time.  [name], [place] Social Worker [facility] Cell#: [phone]

## 2017-08-17 NOTE — Plan of Care (Signed)
Discharge instructions reviewed with patient including medications, questions answered, verbalized understanding.  Patient awaiting [facility] transport back to ALF.

## 2017-08-17 NOTE — Progress Notes (Signed)
Spoke with Ivin Booty at [name] regarding patients medications, that prescriptions will be included in patients packet and that patient is coming back to ALF on insulin.  Ivin Booty says there is a monitor to check patients blood sugar and other residents at the ALF who use insulin.  Will go over discharge instructions with patient and send discharge paperwork and scripts with him to ALF.

## 2017-08-17 NOTE — Care Management Note (Signed)
Case Management Note  Patient Details  Name: [name] MRN: [id number] Date of Birth: [birth year]  Subjective/Objective:    Acute on chronic resp failure with hypoxia, stage 4 NSCLC, CHF, CKD                Action/Plan: Please see previous NCM notes. HH arranged with [facility]. Contacted [facility] with new referral for [facility]. Pt scheduled to dc back to ALF.   Expected Discharge Date:  08/17/17               Expected Discharge Plan:  Assisted Living / Rest Home  In-House Referral:  Clinical Social Work  Discharge planning Services  CM Consult  Post Acute Care Choice:  Home Health Choice offered to:  Patient  DME Arranged:  N/A DME Agency:  NA  HH Arranged:  RN, PT, Respirator Therapy [place] Agency:  [place]  Status of Service:  Completed, signed off  If discussed at [place] of Stay Meetings, dates discussed:    Additional Comments:  Erenest Rasher, RN 08/17/2017, 1:33 PM

## 2017-08-17 NOTE — Progress Notes (Signed)
PT eating at this time- will provide breathing medications at a later time. RN aware.

## 2017-08-18 ENCOUNTER — Encounter (HOSPITAL_COMMUNITY): Payer: Self-pay

## 2017-08-18 ENCOUNTER — Emergency Department (HOSPITAL_COMMUNITY): Payer: Medicaid Other

## 2017-08-18 ENCOUNTER — Inpatient Hospital Stay (HOSPITAL_COMMUNITY)
Admission: EM | Admit: 2017-08-18 | Discharge: 2017-08-22 | DRG: 190 | Disposition: A | Discharge status: Nursing Home (Includes ICF) | Payer: Medicaid Other | Attending: Family Medicine | Admitting: Family Medicine

## 2017-08-18 ENCOUNTER — Other Ambulatory Visit: Payer: Self-pay

## 2017-08-18 DIAGNOSIS — F17210 Nicotine dependence, cigarettes, uncomplicated: Secondary | ICD-10-CM | POA: Diagnosis present

## 2017-08-18 DIAGNOSIS — D509 Iron deficiency anemia, unspecified: Secondary | ICD-10-CM | POA: Diagnosis present

## 2017-08-18 DIAGNOSIS — Z86711 Personal history of pulmonary embolism: Secondary | ICD-10-CM | POA: Diagnosis present

## 2017-08-18 DIAGNOSIS — R569 Unspecified convulsions: Secondary | ICD-10-CM

## 2017-08-18 DIAGNOSIS — T380X5A Adverse effect of glucocorticoids and synthetic analogues, initial encounter: Secondary | ICD-10-CM | POA: Diagnosis present

## 2017-08-18 DIAGNOSIS — I10 Essential (primary) hypertension: Secondary | ICD-10-CM | POA: Diagnosis not present

## 2017-08-18 DIAGNOSIS — Z794 Long term (current) use of insulin: Secondary | ICD-10-CM | POA: Diagnosis not present

## 2017-08-18 DIAGNOSIS — J90 Pleural effusion, not elsewhere classified: Secondary | ICD-10-CM

## 2017-08-18 DIAGNOSIS — E43 Unspecified severe protein-calorie malnutrition: Secondary | ICD-10-CM | POA: Diagnosis present

## 2017-08-18 DIAGNOSIS — Z79899 Other long term (current) drug therapy: Secondary | ICD-10-CM | POA: Diagnosis not present

## 2017-08-18 DIAGNOSIS — Z7951 Long term (current) use of inhaled steroids: Secondary | ICD-10-CM | POA: Diagnosis not present

## 2017-08-18 DIAGNOSIS — C3492 Malignant neoplasm of unspecified part of left bronchus or lung: Secondary | ICD-10-CM

## 2017-08-18 DIAGNOSIS — Z85118 Personal history of other malignant neoplasm of bronchus and lung: Secondary | ICD-10-CM

## 2017-08-18 DIAGNOSIS — J9601 Acute respiratory failure with hypoxia: Secondary | ICD-10-CM

## 2017-08-18 DIAGNOSIS — I130 Hypertensive heart and chronic kidney disease with heart failure and stage 1 through stage 4 chronic kidney disease, or unspecified chronic kidney disease: Secondary | ICD-10-CM | POA: Diagnosis present

## 2017-08-18 DIAGNOSIS — J441 Chronic obstructive pulmonary disease with (acute) exacerbation: Secondary | ICD-10-CM | POA: Diagnosis present

## 2017-08-18 DIAGNOSIS — R739 Hyperglycemia, unspecified: Secondary | ICD-10-CM | POA: Diagnosis present

## 2017-08-18 DIAGNOSIS — J9621 Acute and chronic respiratory failure with hypoxia: Secondary | ICD-10-CM | POA: Diagnosis present

## 2017-08-18 DIAGNOSIS — N183 Chronic kidney disease, stage 3 unspecified: Secondary | ICD-10-CM | POA: Diagnosis present

## 2017-08-18 DIAGNOSIS — Z66 Do not resuscitate: Secondary | ICD-10-CM | POA: Diagnosis present

## 2017-08-18 DIAGNOSIS — I5042 Chronic combined systolic (congestive) and diastolic (congestive) heart failure: Secondary | ICD-10-CM | POA: Diagnosis present

## 2017-08-18 DIAGNOSIS — Z9981 Dependence on supplemental oxygen: Secondary | ICD-10-CM | POA: Diagnosis not present

## 2017-08-18 DIAGNOSIS — Z681 Body mass index (BMI) 19 or less, adult: Secondary | ICD-10-CM

## 2017-08-18 DIAGNOSIS — Z86718 Personal history of other venous thrombosis and embolism: Secondary | ICD-10-CM | POA: Diagnosis not present

## 2017-08-18 DIAGNOSIS — C7951 Secondary malignant neoplasm of bone: Secondary | ICD-10-CM

## 2017-08-18 DIAGNOSIS — N189 Chronic kidney disease, unspecified: Secondary | ICD-10-CM | POA: Clinically undetermined

## 2017-08-18 DIAGNOSIS — N179 Acute kidney failure, unspecified: Secondary | ICD-10-CM | POA: Clinically undetermined

## 2017-08-18 DIAGNOSIS — I2699 Other pulmonary embolism without acute cor pulmonale: Secondary | ICD-10-CM | POA: Diagnosis present

## 2017-08-18 DIAGNOSIS — Z7901 Long term (current) use of anticoagulants: Secondary | ICD-10-CM | POA: Diagnosis not present

## 2017-08-18 LAB — CBC WITH DIFFERENTIAL/PLATELET
BASOS PCT: 0 %
Basophils Absolute: 0 10*3/uL (ref 0.0–0.1)
EOS ABS: 0 10*3/uL (ref 0.0–0.7)
Eosinophils Relative: 0 %
HEMATOCRIT: 29.6 % — AB (ref 39.0–52.0)
HEMOGLOBIN: 9.2 g/dL — AB (ref 13.0–17.0)
Lymphocytes Relative: 5 %
Lymphs Abs: 1.1 10*3/uL (ref 0.7–4.0)
MCH: 33.2 pg (ref 26.0–34.0)
MCHC: 31.1 g/dL (ref 30.0–36.0)
MCV: 106.9 fL — ABNORMAL HIGH (ref 78.0–100.0)
MONO ABS: 1.5 10*3/uL — AB (ref 0.1–1.0)
MONOS PCT: 7 %
NEUTROS ABS: 19.9 10*3/uL — AB (ref 1.7–7.7)
NEUTROS PCT: 88 %
Platelets: 291 10*3/uL (ref 150–400)
RBC: 2.77 MIL/uL — ABNORMAL LOW (ref 4.22–5.81)
RDW: 18.8 % — AB (ref 11.5–15.5)
WBC: 22.5 10*3/uL — ABNORMAL HIGH (ref 4.0–10.5)

## 2017-08-18 LAB — BRAIN NATRIURETIC PEPTIDE: B Natriuretic Peptide: 175.3 pg/mL — ABNORMAL HIGH (ref 0.0–100.0)

## 2017-08-18 LAB — COMPREHENSIVE METABOLIC PANEL
ALBUMIN: 3.5 g/dL (ref 3.5–5.0)
ALT: 18 U/L (ref 0–44)
AST: 29 U/L (ref 15–41)
Alkaline Phosphatase: 115 U/L (ref 38–126)
Anion gap: 11 (ref 5–15)
BUN: 29 mg/dL — ABNORMAL HIGH (ref 6–20)
CO2: 38 mmol/L — AB (ref 22–32)
Calcium: 9.5 mg/dL (ref 8.9–10.3)
Chloride: 94 mmol/L — ABNORMAL LOW (ref 98–111)
Creatinine, Ser: 1.7 mg/dL — ABNORMAL HIGH (ref 0.61–1.24)
GFR calc Af Amer: 49 mL/min — ABNORMAL LOW (ref >60)
GFR calc non Af Amer: 43 mL/min — ABNORMAL LOW (ref >60)
GLUCOSE: 146 mg/dL — AB (ref 70–99)
Potassium: 3.7 mmol/L (ref 3.5–5.1)
SODIUM: 143 mmol/L (ref 135–145)
Total Bilirubin: 0.5 mg/dL (ref 0.3–1.2)
Total Protein: 7 g/dL (ref 6.5–8.1)

## 2017-08-18 LAB — I-STAT TROPONIN, ED: Troponin i, poc: 0.01 ng/mL (ref 0.00–0.08)

## 2017-08-18 LAB — GLUCOSE, CAPILLARY: Glucose-Capillary: 161 mg/dL — ABNORMAL HIGH (ref 70–99)

## 2017-08-18 MED ORDER — ALBUTEROL (5 MG/ML) CONTINUOUS INHALATION SOLN
15.0000 mg | INHALATION_SOLUTION | RESPIRATORY_TRACT | Status: DC
  Administered 2017-08-18: 15 mg via RESPIRATORY_TRACT
  Filled 2017-08-18: qty 20

## 2017-08-18 MED ORDER — POTASSIUM CHLORIDE ER 10 MEQ PO TBCR
20.0000 meq | EXTENDED_RELEASE_TABLET | Freq: Every day | ORAL | Status: DC
  Administered 2017-08-19 – 2017-08-21 (×3): 20 meq via ORAL
  Filled 2017-08-18 (×7): qty 2

## 2017-08-18 MED ORDER — ESCITALOPRAM OXALATE 10 MG PO TABS
5.0000 mg | ORAL_TABLET | Freq: Every day | ORAL | Status: DC
  Administered 2017-08-19 – 2017-08-20 (×2): 5 mg via ORAL
  Filled 2017-08-18 (×3): qty 1

## 2017-08-18 MED ORDER — FOLIC ACID 1 MG PO TABS
1.0000 mg | ORAL_TABLET | Freq: Every day | ORAL | Status: DC
  Administered 2017-08-19 – 2017-08-21 (×3): 1 mg via ORAL
  Filled 2017-08-18 (×3): qty 1

## 2017-08-18 MED ORDER — IPRATROPIUM BROMIDE 0.02 % IN SOLN: 0.5000 mg | Freq: Four times a day (QID) | RESPIRATORY_TRACT | Status: DC

## 2017-08-18 MED ORDER — METHYLPREDNISOLONE SODIUM SUCC 125 MG IJ SOLR
125.0000 mg | Freq: Once | INTRAMUSCULAR | Status: AC
  Administered 2017-08-18: 125 mg via INTRAVENOUS
  Filled 2017-08-18: qty 2

## 2017-08-18 MED ORDER — FUROSEMIDE 40 MG PO TABS
40.0000 mg | ORAL_TABLET | Freq: Two times a day (BID) | ORAL | Status: DC
  Administered 2017-08-18 – 2017-08-22 (×6): 40 mg via ORAL
  Filled 2017-08-18 (×8): qty 1

## 2017-08-18 MED ORDER — METHYLPREDNISOLONE SODIUM SUCC 40 MG IJ SOLR
40.0000 mg | Freq: Two times a day (BID) | INTRAMUSCULAR | Status: AC
  Administered 2017-08-18 – 2017-08-19 (×3): 40 mg via INTRAVENOUS
  Filled 2017-08-18 (×3): qty 1

## 2017-08-18 MED ORDER — PANTOPRAZOLE SODIUM 40 MG PO TBEC
40.0000 mg | DELAYED_RELEASE_TABLET | Freq: Two times a day (BID) | ORAL | Status: DC
  Administered 2017-08-18 – 2017-08-22 (×7): 40 mg via ORAL
  Filled 2017-08-18 (×7): qty 1

## 2017-08-18 MED ORDER — IPRATROPIUM-ALBUTEROL 0.5-2.5 (3) MG/3ML IN SOLN
3.0000 mL | Freq: Four times a day (QID) | RESPIRATORY_TRACT | Status: DC
  Administered 2017-08-18 – 2017-08-19 (×4): 3 mL via RESPIRATORY_TRACT
  Filled 2017-08-18 (×5): qty 3

## 2017-08-18 MED ORDER — HYDROXYZINE HCL 10 MG PO TABS
10.0000 mg | ORAL_TABLET | Freq: Three times a day (TID) | ORAL | Status: DC | PRN
  Administered 2017-08-19: 10 mg via ORAL
  Filled 2017-08-18 (×2): qty 1

## 2017-08-18 MED ORDER — GUAIFENESIN ER 600 MG PO TB12
1200.0000 mg | ORAL_TABLET | Freq: Two times a day (BID) | ORAL | Status: DC
  Administered 2017-08-18 – 2017-08-22 (×7): 1200 mg via ORAL
  Filled 2017-08-18 (×7): qty 2

## 2017-08-18 MED ORDER — CLONAZEPAM 0.5 MG PO TABS
0.2500 mg | ORAL_TABLET | Freq: Two times a day (BID) | ORAL | Status: DC | PRN
  Administered 2017-08-19: 0.25 mg via ORAL
  Filled 2017-08-18: qty 1

## 2017-08-18 MED ORDER — IPRATROPIUM BROMIDE 0.02 % IN SOLN
0.5000 mg | Freq: Once | RESPIRATORY_TRACT | Status: AC
  Administered 2017-08-18: 0.5 mg via RESPIRATORY_TRACT
  Filled 2017-08-18: qty 2.5

## 2017-08-18 MED ORDER — ENSURE ENLIVE PO LIQD
237.0000 mL | Freq: Three times a day (TID) | ORAL | Status: DC
  Administered 2017-08-20 – 2017-08-22 (×4): 237 mL via ORAL

## 2017-08-18 MED ORDER — APIXABAN 5 MG PO TABS
5.0000 mg | ORAL_TABLET | Freq: Two times a day (BID) | ORAL | Status: DC
  Administered 2017-08-18 – 2017-08-22 (×7): 5 mg via ORAL
  Filled 2017-08-18 (×8): qty 1

## 2017-08-18 MED ORDER — FERROUS SULFATE 325 (65 FE) MG PO TABS
325.0000 mg | ORAL_TABLET | Freq: Two times a day (BID) | ORAL | Status: DC
  Administered 2017-08-18 – 2017-08-22 (×4): 325 mg via ORAL
  Filled 2017-08-18 (×5): qty 1

## 2017-08-18 MED ORDER — ALBUTEROL SULFATE (2.5 MG/3ML) 0.083% IN NEBU: 2.5000 mg | INHALATION_SOLUTION | Freq: Four times a day (QID) | RESPIRATORY_TRACT | Status: DC

## 2017-08-18 MED ORDER — ONDANSETRON HCL 4 MG PO TABS
4.0000 mg | ORAL_TABLET | Freq: Four times a day (QID) | ORAL | Status: DC | PRN
  Administered 2017-08-21: 4 mg via ORAL
  Filled 2017-08-18: qty 1

## 2017-08-18 MED ORDER — GABAPENTIN 100 MG PO CAPS
100.0000 mg | ORAL_CAPSULE | Freq: Three times a day (TID) | ORAL | Status: DC
  Administered 2017-08-18 – 2017-08-22 (×9): 100 mg via ORAL
  Filled 2017-08-18 (×10): qty 1

## 2017-08-18 MED ORDER — PRO-STAT SUGAR FREE PO LIQD
30.0000 mL | Freq: Every day | ORAL | Status: DC
  Filled 2017-08-18 (×2): qty 30

## 2017-08-18 MED ORDER — AMLODIPINE BESYLATE 5 MG PO TABS
2.5000 mg | ORAL_TABLET | Freq: Every day | ORAL | Status: DC
  Administered 2017-08-19 – 2017-08-22 (×4): 2.5 mg via ORAL
  Filled 2017-08-18 (×4): qty 1

## 2017-08-18 MED ORDER — SENNOSIDES-DOCUSATE SODIUM 8.6-50 MG PO TABS
2.0000 | ORAL_TABLET | Freq: Two times a day (BID) | ORAL | Status: DC
  Administered 2017-08-20: 2 via ORAL
  Filled 2017-08-18 (×6): qty 2

## 2017-08-18 MED ORDER — METOCLOPRAMIDE HCL 5 MG PO TABS
5.0000 mg | ORAL_TABLET | Freq: Three times a day (TID) | ORAL | Status: DC
  Administered 2017-08-18 – 2017-08-21 (×7): 5 mg via ORAL
  Filled 2017-08-18 (×8): qty 1

## 2017-08-18 MED ORDER — MORPHINE SULFATE (PF) 4 MG/ML IV SOLN
4.0000 mg | Freq: Once | INTRAVENOUS | Status: AC
  Administered 2017-08-18: 4 mg via INTRAVENOUS
  Filled 2017-08-18: qty 1

## 2017-08-18 MED ORDER — SUCRALFATE 1 GM/10ML PO SUSP
1.0000 g | Freq: Three times a day (TID) | ORAL | Status: DC
  Administered 2017-08-18 – 2017-08-19 (×3): 1 g via ORAL
  Filled 2017-08-18 (×3): qty 10

## 2017-08-18 MED ORDER — HYDROCOD POLST-CPM POLST ER 10-8 MG/5ML PO SUER
5.0000 mL | Freq: Two times a day (BID) | ORAL | Status: DC
  Administered 2017-08-18 – 2017-08-22 (×7): 5 mL via ORAL
  Filled 2017-08-18 (×7): qty 5

## 2017-08-18 MED ORDER — METOPROLOL TARTRATE 25 MG PO TABS
50.0000 mg | ORAL_TABLET | Freq: Two times a day (BID) | ORAL | Status: DC
  Administered 2017-08-18 – 2017-08-21 (×6): 50 mg via ORAL
  Filled 2017-08-18 (×6): qty 2

## 2017-08-18 MED ORDER — POLYETHYLENE GLYCOL 3350 17 G PO PACK
17.0000 g | PACK | Freq: Every day | ORAL | Status: DC
  Filled 2017-08-18 (×2): qty 1

## 2017-08-18 MED ORDER — IPRATROPIUM-ALBUTEROL 0.5-2.5 (3) MG/3ML IN SOLN: 3.0000 mL | Freq: Four times a day (QID) | RESPIRATORY_TRACT | Status: DC

## 2017-08-18 MED ORDER — ALBUTEROL SULFATE (2.5 MG/3ML) 0.083% IN NEBU
2.5000 mg | INHALATION_SOLUTION | RESPIRATORY_TRACT | Status: DC | PRN
  Administered 2017-08-19 (×2): 2.5 mg via RESPIRATORY_TRACT
  Filled 2017-08-18 (×4): qty 3

## 2017-08-18 MED ORDER — OXYCODONE HCL 5 MG PO TABS
10.0000 mg | ORAL_TABLET | ORAL | Status: DC | PRN
  Administered 2017-08-18 – 2017-08-22 (×8): 10 mg via ORAL
  Filled 2017-08-18 (×8): qty 2

## 2017-08-18 NOTE — ED Notes (Signed)
Pt's oxygen level on 4L laying in bed is 97%. Pt's O2 decreased to 3L for pt to ambulate and standing at bedside pt O2 levels dropped to 82% on the 3L and pt was struggling to breathe. Pt was not ambulated any further. Pt placed back in bed.

## 2017-08-18 NOTE — ED Notes (Signed)
Bed: RESA Expected date:  Expected time:  Means of arrival:  Comments: EMS-CPAP

## 2017-08-18 NOTE — ED Provider Notes (Signed)
Emergency Department Provider Note   I have reviewed the triage vital signs and the nursing notes.   HISTORY  Chief Complaint Shortness of Breath   HPI [name] is a 57 y.o. male with PMH of adenocarcinoma of the left lung, end-stage COPD, HTN, seizures, and prior PE resents to the emergency department for evaluation of wheezing and respiratory distress.  He was with a friend when symptoms worsened.  EMS was called and started the patient on CPAP.  He was given 2 nebulizer treatments in route with only mild improvement.  Patient denies any chest pain or heart palpitations.  No fevers or chills.  No productive cough.   Level 5 caveat: Respiratory Distress  Past Medical History:  Diagnosis Date  . Abdominal pain 06/04/2016  . Adenocarcinoma of left lung, stage 4 ([place]) 05/02/2016  . Alcohol abuse   . Bronchitis due to tobacco use ([place])   . Cancer ([place])    Lung  . COPD (chronic obstructive pulmonary disease) ([place])    on home o2 2LNC   . Dehydration 06/04/2016  . Encounter for antineoplastic chemotherapy 05/02/2016  . Gastritis   . Goals of care, counseling/discussion 05/02/2016  . Hematemesis   . HTN (hypertension) 10/30/2016  . Pulmonary embolism ([place]) 06/30/2017   on CTA chest  . Recurrent upper respiratory infection (URI)   . Seizures ([place]) 05/2011   new onset    Patient Active Problem List   Diagnosis Date Noted  . Acute on chronic respiratory failure with hypoxia and hypercapnia ([place]) 08/13/2017  . Protein-calorie malnutrition, severe 07/31/2017  . Thrombocytopenia ([place]) 07/29/2017  . Wheezing on exhalation 07/29/2017  . Anemia 07/24/2017  . Normocytic anemia 07/24/2017  . Chronic diastolic (congestive) heart failure ([place]) 07/24/2017  . Recurrent pleural effusion on right 07/15/2017  . Acute on chronic respiratory failure ([place]) 07/11/2017  . History of pulmonary embolism 07/11/2017  . PE (pulmonary thromboembolism) ([place]) 06/30/2017  . Pulmonary embolism  ([place]) 06/30/2017  . AKI (acute kidney injury) ([place]) 06/25/2017  . Dyspnea 06/21/2017  . Chronic combined systolic (congestive) and diastolic (congestive) heart failure ([place])   . Constipation 06/07/2017  . Pain   . Acute on chronic respiratory failure with hypoxia ([place]) 05/28/2017  . Chronic respiratory failure with hypoxia ([place]) 04/01/2017  . Leucocytosis 03/16/2017  . CKD (chronic kidney disease), stage III ([place]) 03/12/2017  . Malnutrition of moderate degree 03/10/2017  . COPD exacerbation ([place]) 03/09/2017  . Macrocytic anemia 02/07/2017  . Severe malnutrition ([place]) 01/03/2017  . DOE (dyspnea on exertion) 12/30/2016  . Nausea   . Hypervolemia   . HCAP (healthcare-associated pneumonia) 12/19/2016  . SOB (shortness of breath) 12/19/2016  . Erosive gastropathy 12/18/2016  . Gastritis 12/18/2016  . Hematemesis 12/16/2016  . Intractable nausea and vomiting 12/16/2016  . HTN (hypertension) 10/30/2016  . Chronic obstructive pulmonary disease ([place]) 09/02/2016  . COPD (chronic obstructive pulmonary disease) ([place]) 08/19/2016  . Dehydration 06/04/2016  . Abdominal pain 06/04/2016  . 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 Adenocarcinoma of left lung, stage 4 ([place]) 05/02/2016  . Goals of care, counseling/discussion 05/02/2016  . Encounter for antineoplastic chemotherapy 05/02/2016  . Tobacco abuse 05/30/2011  . Alcohol abuse 05/30/2011  . Seizure ([place]) 05/28/2011    Past Surgical History:  Procedure Laterality Date  . ESOPHAGOGASTRODUODENOSCOPY (EGD) WITH PROPOFOL N/A 12/17/2016   Procedure: ESOPHAGOGASTRODUODENOSCOPY (EGD) WITH PROPOFOL;  Surgeon: Ladene Artist, MD;  Location: [facility];  Service: Endoscopy;  Laterality: N/A;  . IR FLUORO GUIDE  PORT INSERTION RIGHT  05/13/2016  . IR US GUIDE VASC ACCESS RIGHT  05/13/2016  . NO PAST SURGERIES    . THORACENTESIS  07/05/2017   ultrasound guided    Allergies Patient has no known allergies.  Family History  Problem Relation Age  of Onset  . Cancer Father   . Diabetes Mellitus II Mother     Social History Social History   Tobacco Use  . Smoking status: Current Some Day Smoker    Packs/day: 0.10    Years: 30.00    Pack years: 3.00    Types: Cigarettes  . Smokeless tobacco: Never Used  . Tobacco comment: 1 cig a week  Substance Use Topics  . Alcohol use: No    Frequency: Never  . Drug use: No    Review of Systems  Constitutional: No fever/chills Eyes: No visual changes. ENT: No sore throat. Cardiovascular: Denies chest pain. Respiratory: Positive shortness of breath. Gastrointestinal: No abdominal pain.  No nausea, no vomiting.  No diarrhea.  No constipation. Genitourinary: Negative for dysuria. Musculoskeletal: Negative for back pain. Skin: Negative for rash. Neurological: Negative for headaches, focal weakness or numbness.  10-point ROS otherwise negative.  ____________________________________________   PHYSICAL EXAM:  VITAL SIGNS: ED Triage Vitals  Enc Vitals Group     BP 08/18/17 1219 (!) 174/102     Pulse Rate 08/18/17 1219 92     Resp 08/18/17 1219 (!) 30     SpO2 08/18/17 1219 100 %     Pain Score 08/18/17 1225 0   Constitutional: Alert and oriented. Patient arrives in acute respiratory distress.  Eyes: Conjunctivae are normal.  Head: Atraumatic. Nose: No congestion/rhinnorhea. Mouth/Throat: Mucous membranes are moist.  Neck: No stridor.   Cardiovascular: Normal rate, regular rhythm. Good peripheral circulation. Grossly normal heart sounds.   Respiratory: Increased respiratory effort.  No retractions. Lungs with diminished lung sounds bilaterally.  Gastrointestinal: Soft and nontender. No distention.  Musculoskeletal: No lower extremity tenderness nor edema. No gross deformities of extremities. Neurologic:  Normal speech and language. No gross focal neurologic deficits are appreciated.  Skin:  Skin is warm, dry and intact. No rash  noted. ____________________________________________   LABS (all labs ordered are listed, but only abnormal results are displayed)  Labs Reviewed  COMPREHENSIVE METABOLIC PANEL - Abnormal; Notable for the following components:      Result Value   Chloride 94 (*)    CO2 38 (*)    Glucose, Bld 146 (*)    BUN 29 (*)    Creatinine, Ser 1.70 (*)    GFR calc non Af Amer 43 (*)    GFR calc Af Amer 49 (*)    All other components within normal limits  BRAIN NATRIURETIC PEPTIDE - Abnormal; Notable for the following components:   B Natriuretic Peptide 175.3 (*)    All other components within normal limits  CBC WITH DIFFERENTIAL/PLATELET - Abnormal; Notable for the following components:   WBC 22.5 (*)    RBC 2.77 (*)    Hemoglobin 9.2 (*)    HCT 29.6 (*)    MCV 106.9 (*)    RDW 18.8 (*)    Neutro Abs 19.9 (*)    Monocytes Absolute 1.5 (*)    All other components within normal limits  I-STAT TROPONIN, ED   ____________________________________________  RADIOLOGY  Dg Chest Portable 1 View  Result Date: 08/18/2017 CLINICAL DATA:  Short of breath EXAM: PORTABLE CHEST 1 VIEW COMPARISON:  08/13/2017 FINDINGS: Right lower lobe airspace  disease and small right effusion unchanged. Small left effusion unchanged. No new findings. Port-A-Cath tip unchanged lower SVC IMPRESSION: Right lower lobe airspace disease and right pleural effusion unchanged. Small left effusion unchanged. No new findings. Electronically Signed   By: Franchot Gallo M.D.   On: 08/18/2017 13:04    ____________________________________________   PROCEDURES  Procedure(s) performed:   .Critical Care Performed by: Margette Fast, MD Authorized by: Margette Fast, MD   Critical care provider statement:    Critical care time (minutes):  35   Critical care time was exclusive of:  Teaching time and separately billable procedures and treating other patients   Critical care was necessary to treat or prevent imminent or  life-threatening deterioration of the following conditions:  Respiratory failure   Critical care was time spent personally by me on the following activities:  Blood draw for specimens, development of treatment plan with patient or surrogate, evaluation of patient's response to treatment, examination of patient, obtaining history from patient or surrogate, ordering and performing treatments and interventions, ordering and review of laboratory studies, ordering and review of radiographic studies, pulse oximetry, re-evaluation of patient's condition and review of old charts   I assumed direction of critical care for this patient from another provider in my specialty: no     ____________________________________________   INITIAL IMPRESSION / [place] / ED COURSE  Pertinent labs & imaging results that were available during my care of the patient were reviewed by me and considered in my medical decision making (see chart for details).  Patient arrives to the emergency department in acute respiratory distress.  He has known adenocarcinoma of the lung and severe COPD.  In transitioning the patient from CPAP to BiPAP here in the emergency department he requested to not be placed on BiPAP.  He would agree to continuous albuterol neb with additional Atrovent.  Patient has diminished lung sounds bilaterally with end expiratory wheezing.  Suspect that this is a COPD exacerbation although the patient's respiratory comorbidities are numerous.  Lower suspicion for PE without chest pain.  Plan for continued supportive care, chest x-ray, labs, reassess.  Patient with continued increased WOB after 1 hour CAT treatment. Advised that patient go no BiPAP and he agreed. On re-evaluation after BiPAP placement he is going well and tolerating the mask well. The sats are in the mid 90s. Labs and CXR reviewed and are near baseline.   Discussed patient's case with Hospitalist, Dr. Wynelle Cleveland to request admission. Patient  and family (if present) updated with plan. Care transferred to Hospitalist service.  I reviewed all nursing notes, vitals, pertinent old records, EKGs, labs, imaging (as available).  ____________________________________________  FINAL CLINICAL IMPRESSION(S) / ED DIAGNOSES  Final diagnoses:  Acute respiratory failure with hypoxia (HCC)  COPD exacerbation (HCC)     MEDICATIONS GIVEN DURING THIS VISIT:  Medications  albuterol (PROVENTIL,VENTOLIN) solution continuous neb (0 mg Nebulization Stopped 08/18/17 1417)  ipratropium (ATROVENT) nebulizer solution 0.5 mg (0.5 mg Nebulization Given 08/18/17 1225)  morphine 4 MG/ML injection 4 mg (4 mg Intravenous Given 08/18/17 1349)  methylPREDNISolone sodium succinate (SOLU-MEDROL) 125 mg/2 mL injection 125 mg (125 mg Intravenous Given 08/18/17 1349)    Note:  This document was prepared using Dragon voice recognition software and may include unintentional dictation errors.  Nanda Quinton, MD Emergency Medicine    Long, Wonda [facility], MD 08/18/17 [phone]

## 2017-08-18 NOTE — H&P (Addendum)
History and Physical    [name]  JZP:[id number]  DOB: 04-Sep-1960  DOA: 08/18/2017 PCP: [name], MD   Patient coming from: home  Chief Complaint: dyspnea  HPI: [name] is a 57 y.o. male with medical history of stage 4 lung adenocarcinoma, COPD on chronic O2, PE, HTN, CKD3 who was just discharged home yesterday and returns for dyspnea once again. He is a frequent flyer. Today he is hypoxic and unable to ambulate due to severe dyspnea. He was placed on a BiPAP in ED but wanted it off and appeared to be doing well however, he was unable to ambulate in the ED and became short of breath. He has declined hospice/ palliative care for his symptoms in the past.   ED Course: Neb treatments, IV steroids and BiPAP  Review of Systems:  All other systems reviewed and apart from HPI, are negative.  Past Medical History:  Diagnosis Date  . Abdominal pain 06/04/2016  . Adenocarcinoma of left lung, stage 4 ([place]) 05/02/2016  . Alcohol abuse   . Bronchitis due to tobacco use ([place])   . Cancer ([place])    Lung  . COPD (chronic obstructive pulmonary disease) ([place])    on home o2 2LNC   . Dehydration 06/04/2016  . Encounter for antineoplastic chemotherapy 05/02/2016  . Gastritis   . Goals of care, counseling/discussion 05/02/2016  . Hematemesis   . HTN (hypertension) 10/30/2016  . Pulmonary embolism ([place]) 06/30/2017   on CTA chest  . Recurrent upper respiratory infection (URI)   . Seizures ([place]) 05/2011   new onset    Past Surgical History:  Procedure Laterality Date  . ESOPHAGOGASTRODUODENOSCOPY (EGD) WITH PROPOFOL N/A 12/17/2016   Procedure: ESOPHAGOGASTRODUODENOSCOPY (EGD) WITH PROPOFOL;  Surgeon: Ladene Artist, MD;  [facility];  Service: Endoscopy;  Laterality: N/A;  . IR FLUORO GUIDE PORT INSERTION RIGHT  05/13/2016  . IR US GUIDE VASC ACCESS RIGHT  05/13/2016  . NO PAST SURGERIES    . THORACENTESIS  07/05/2017   ultrasound guided    Social History:   reports that he has been smoking cigarettes.  He has a 3.00 pack-year smoking history. He has never used smokeless tobacco. He reports that he does not drink alcohol or use drugs.  No Known Allergies  Family History  Problem Relation Age of Onset  . Cancer Father   . Diabetes Mellitus II Mother      Prior to Admission medications   Medication Sig Start Date End Date Taking? Authorizing Provider  albuterol (PROVENTIL) (2.5 MG/3ML) 0.083% nebulizer solution 1 neb every 4-6 hours as needed for wheezing and shortness of breath Patient taking differently: Take 2.5 mg by nebulization every 6 (six) hours as needed for wheezing or shortness of breath.  04/01/17   Parrett, Fonnie Mu, NP  albuterol (PROVENTIL) (2.5 MG/3ML) 0.083% nebulizer solution Take 3 mLs (2.5 mg total) by nebulization every 6 (six) hours as needed for wheezing or shortness of breath. 08/17/17 09/16/17  Jani Gravel, MD  Amino Acids-Protein Hydrolys (FEEDING SUPPLEMENT, PRO-STAT SUGAR FREE 64,) LIQD Take 30 mLs by mouth daily. 08/17/17   Jani Gravel, MD  amLODipine (NORVASC) 2.5 MG tablet Take 1 tablet (2.5 mg total) by mouth daily. 08/17/17   Jani Gravel, MD  apixaban (ELIQUIS) 5 MG TABS tablet Take 5 mg by mouth 2 (two) times daily.    [provider]  azithromycin (ZITHROMAX) 250 MG tablet Take 1 tablet (250 mg total) by mouth daily. 08/17/17  Jani Gravel, MD  Chlorphen-Phenyleph-ASA (ALKA-SELTZER PLUS COLD PO) Take 2 tablets by mouth at bedtime as needed (COUGH).    [provider]  clonazePAM (KLONOPIN) 0.5 MG tablet Take 0.25 mg by mouth 2 (two) times daily as needed for anxiety.    [provider]  escitalopram (LEXAPRO) 5 MG tablet Take 1 tablet (5 mg total) by mouth at bedtime. 04/17/17   Rosita Fire, MD  feeding supplement, ENSURE ENLIVE, (ENSURE ENLIVE) LIQD Take 237 mLs by mouth 3 (three) times daily between meals. 08/17/17   Jani Gravel, MD  ferrous sulfate 325 (65 FE) MG tablet Take 1 tablet (325  mg total) by mouth 2 (two) times daily with a meal. 07/12/17   Lavina Hamman, MD  Fluticasone-Umeclidin-Vilant (TRELEGY ELLIPTA) 100-62.5-25 MCG/INH AEPB Inhale 1 puff into the lungs daily. 05/31/17   Aline August, MD  folic acid (FOLVITE) 1 MG tablet Take 1 tablet (1 mg total) by mouth daily. 07/13/17   Lavina Hamman, MD  furosemide (LASIX) 40 MG tablet Take 1 tablet (40 mg total) by mouth 2 (two) times daily. Patient taking differently: Take 40 mg by mouth daily.  08/09/17   Lavina Hamman, MD  gabapentin (NEURONTIN) 100 MG capsule Take 1 capsule (100 mg total) by mouth 3 (three) times daily. 07/12/17   Lavina Hamman, MD  guaiFENesin (MUCINEX) 600 MG 12 hr tablet Take 1,200 mg by mouth 2 (two) times daily.     [provider]  Hydrocod Polst-Chlorphen Polst (TUSSIONEX PENNKINETIC ER PO) Take 5 mLs by mouth every 12 (twelve) hours.    [provider]  hydrOXYzine (ATARAX/VISTARIL) 10 MG tablet Take 1 tablet (10 mg total) by mouth 3 (three) times daily as needed for anxiety. 07/25/17   Donne Hazel, MD  insulin aspart (NOVOLOG) 100 UNIT/ML injection Please inject  0-9 Units, Subcutaneous, 3 times daily with meals, First dose on Fri 08/15/17 at 1700 Correction coverage: Sensitive (thin, NPO, renal) CBG < 70: implement hypoglycemia protocol CBG 70 - 120: 0 units CBG 121 - 150: 1 unit CBG 151 - 200: 2 units CBG 201 - 250: 3 units CBG 251 - 300: 5 units CBG 301 - 350: 7 units CBG 351 - 400: 9 units CBG > 400: call MD and obtain STAT lab verification 08/17/17   Jani Gravel, MD  insulin aspart (NOVOLOG) 100 UNIT/ML injection 0-5 Units, Subcutaneous, Daily at bedtime, First dose on Fri 08/15/17 at 2200 Correction coverage: HS scale CBG < 70: implement hypoglycemia protocol CBG 70 - 120: 0 units CBG 121 - 150: 0 units CBG 151 - 200: 0 units CBG 201 - 250: 2 units CBG 251 - 300: 3 units CBG 301 - 350: 4 units CBG 351 - 400: 5 units CBG > 400: call MD and obtain STAT lab  verification 08/17/17   Jani Gravel, MD  loratadine (CLARITIN) 10 MG tablet Take 10 mg by mouth daily.    [provider]  metoCLOPramide (REGLAN) 5 MG tablet Take 1 tablet (5 mg total) by mouth 3 (three) times daily. 08/17/17 08/17/18  Jani Gravel, MD  metoprolol tartrate (LOPRESSOR) 50 MG tablet Take 1 tablet (50 mg total) by mouth 2 (two) times daily. 08/17/17   Jani Gravel, MD  Multiple Vitamin (THERA-TABS PO) Take 1 tablet by mouth daily.    [provider]  ondansetron (ZOFRAN) 4 MG tablet Take 1 tablet (4 mg total) by mouth every 6 (six) hours as needed for nausea. 08/17/17  Jani Gravel, MD  Oxycodone HCl 10 MG TABS Take 1 tablet (10 mg total) by mouth every 4 (four) hours as needed (pain). 08/13/17   Curt Bears, MD  pantoprazole (PROTONIX) 40 MG tablet Take 1 tablet (40 mg total) by mouth 2 (two) times daily. 12/18/16   Eugenie Filler, MD  polyethylene glycol [facility] / Floria Raveling) packet Take 17 g by mouth daily. 07/29/17   Lavina Hamman, MD  potassium chloride (K-DUR) 10 MEQ tablet Take 2 tablets (20 mEq total) by mouth daily. 07/28/17   Lavina Hamman, MD  predniSONE (DELTASONE) 20 MG tablet 60mg  po qday x 2 days then 50mg  po qday x 2 days then 40mg  po qday x 2 days then 30mg  po qday x 2 days then 20mg  po qday x 2 days then 10mg  po qday x 2 days. 08/17/17   Jani Gravel, MD  senna-docusate (SENOKOT-S) 8.6-50 MG tablet Take 2 tablets by mouth 2 (two) times daily. 07/12/17   Lavina Hamman, MD  sucralfate (CARAFATE) 1 GM/10ML suspension Take 10 mLs (1 g total) by mouth 4 (four) times daily -  with meals and at bedtime. 07/12/17   Lavina Hamman, MD    Physical Exam: Wt Readings from Last 3 Encounters:  08/14/17 52.6 kg (115 lb 15.4 oz)  08/09/17 53.1 kg (117 lb 1 oz)  07/29/17 52.9 kg (116 lb 10 oz)   Vitals:   08/18/17 1500 08/18/17 1501 08/18/17 1530 08/18/17 1535  BP: (!) 154/100 (!) 153/91 (!) 142/81 (!) 142/81  Pulse:  (!) 101  97  Resp: 14 16 19 19   SpO2: 92% 96%  98% 98%      Constitutional:  Calm & comfortable Eyes: PERRLA, lids and conjunctivae normal ENT:  Mucous membranes are moist.  Pharynx clear of exudate   Normal dentition.  Neck: Supple, no masses  Respiratory:  Clear to auscultation bilaterally - poor air movement .  Cardiovascular:  S1 & S2 heard, regular rate and rhythm No Murmurs Abdomen:  Non distended No tenderness, No masses Bowel sounds normal Extremities:  No clubbing / cyanosis No pedal edema No joint deformity    Skin:  No rashes, lesions or ulcers Neurologic:  AAO x 3 CN 2-12 grossly intact Sensation intact Strength 5/5 in all 4 extremities Psychiatric:  Normal Mood and affect    Labs on Admission: I have personally reviewed following labs and imaging studies  CBC: Recent Labs  Lab 08/12/17 1200 08/13/17 1527 08/15/17 0834 08/16/17 0500 08/17/17 0414 08/18/17 1242  WBC 15.2* 13.3* 11.5* 20.0* 18.2* 22.5*  NEUTROABS 14.8* 11.9*  --   --   --  19.9*  HGB 7.9* 8.5* 7.7* 8.5* 7.9* 9.2*  HCT 24.3* 26.8* 24.2* 27.2* 25.1* 29.6*  MCV 104.3* 105.9* 104.3* 106.3* 106.8* 106.9*  PLT 176 208 222 275 262 102   Basic Metabolic Panel: Recent Labs  Lab 08/14/17 2300 08/15/17 0834 08/16/17 0500 08/17/17 0414 08/18/17 1242  NA 138 137 140 143 143  K 4.0 4.1 3.8 3.7 3.7  CL 93* 91* 88* 90* 94*  CO2 35* 36* 43* 41* 38*  GLUCOSE 317* 333* 313* 154* 146*  BUN 21* 19 23* 24* 29*  CREATININE 1.47* 1.31* 1.45* 1.51* 1.70*  CALCIUM 9.1 9.0 9.7 9.5 9.5   GFR: Estimated Creatinine Clearance: 35.2 mL/min (A) (by C-G formula based on SCr of 1.7 mg/dL (H)). Liver Function Tests: Recent Labs  Lab 08/13/17 1527 08/15/17 0834 08/16/17 0500 08/17/17 0414 08/18/17 1242  AST  26 19 17 17 29   ALT 22 17 16 14 18   ALKPHOS 111 96 98 98 115  BILITOT 0.4 0.4 0.4 0.4 0.5  PROT 6.9 6.0* 6.5 6.1* 7.0  ALBUMIN 3.5 3.4* 3.5 3.2* 3.5   Recent Labs  Lab 08/13/17 1527  LIPASE 24   No results for input(s):  AMMONIA in the last 168 hours. Coagulation Profile: No results for input(s): INR, PROTIME in the last 168 hours. Cardiac Enzymes: Recent Labs  Lab 08/12/17 1200  TROPONINI 0.03*   BNP (last 3 results) No results for input(s): PROBNP in the last 8760 hours. HbA1C: No results for input(s): HGBA1C in the last 72 hours. CBG: Recent Labs  Lab 08/16/17 1204 08/16/17 1715 08/16/17 2140 08/17/17 0749 08/17/17 1145  GLUCAP 132* 161* 149* 127* 183*   Lipid Profile: No results for input(s): CHOL, HDL, LDLCALC, TRIG, CHOLHDL, LDLDIRECT in the last 72 hours. Thyroid Function Tests: No results for input(s): TSH, T4TOTAL, FREET4, T3FREE, THYROIDAB in the last 72 hours. Anemia Panel: No results for input(s): VITAMINB12, FOLATE, FERRITIN, TIBC, IRON, RETICCTPCT in the last 72 hours. Urine analysis:    Component Value Date/Time   COLORURINE YELLOW 07/16/2017 [place] 07/16/2017 1614   LABSPEC 1.011 07/16/2017 1614   PHURINE 6.0 07/16/2017 1614   GLUCOSEU NEGATIVE 07/16/2017 1614   HGBUR NEGATIVE 07/16/2017 1614   BILIRUBINUR SMALL (A) 07/16/2017 1614   KETONESUR NEGATIVE 07/16/2017 1614   PROTEINUR NEGATIVE 07/16/2017 1614   UROBILINOGEN 0.2 05/29/2011 0122   NITRITE NEGATIVE 07/16/2017 1614   LEUKOCYTESUR NEGATIVE 07/16/2017 1614   Sepsis Labs: @LABRCNTIP (procalcitonin:4,lacticidven:4) ) Recent Results (from the past 240 hour(s))  MRSA PCR Screening     Status: None   Collection Time: 08/14/17  6:40 AM  Result Value Ref Range Status   MRSA by PCR NEGATIVE NEGATIVE Final    Comment:        The GeneXpert MRSA Assay (FDA approved for NASAL specimens only), is one component of a comprehensive MRSA colonization surveillance program. It is not intended to diagnose MRSA infection nor to guide or monitor treatment for MRSA infections. Performed at [facility], [place] [address] [place],  [address]      Radiological Exams on  Admission: Dg Chest Portable 1 View  Result Date: 08/18/2017 CLINICAL DATA:  Short of breath EXAM: PORTABLE CHEST 1 VIEW COMPARISON:  08/13/2017 FINDINGS: Right lower lobe airspace disease and small right effusion unchanged. Small left effusion unchanged. No new findings. Port-A-Cath tip unchanged lower SVC IMPRESSION: Right lower lobe airspace disease and right pleural effusion unchanged. Small left effusion unchanged. No new findings. Electronically Signed   By: Franchot Gallo M.D.   On: 08/18/2017 13:04    EKG: Independently reviewed. Sinus rhythm at 93 bpm  Assessment/Plan Principal Problem:   Acute on chronic respiratory failure with hypoxia   COPD exacerbation - treat with Nebs, steroids, Mucinex  Active Problems:     HTN (hypertension) - on Norvasc and Lasix    CKD (chronic kidney disease), stage III  -stable    History of pulmonary embolism - cont Elilquis    Recurrent pleural effusion on right - seen on CXR today    Protein-calorie malnutrition, severe     DVT prophylaxis: Eliquis Code Status: DNR Family Communication:   Disposition Plan: telemetry  Consults called: none  Admission status: med/surg   Debbe Odea MD [facility] Pager: [email or web address] Password [email or web address] 7PM-7AM, please contact night-coverage   08/18/2017, 4:10 PM

## 2017-08-18 NOTE — ED Triage Notes (Addendum)
EMS reports from home SOB with wheezing and distress, hx of COPD and lung cancer,  20ga L forearm 10mg  Albuterol 0.5 Atrovent 125mg  Solumedrol 2G Mag [place]   On CPAP on arrival  BP 185/110 HR 102 Resp 32 Sp02 100% on CPAP

## 2017-08-19 DIAGNOSIS — R739 Hyperglycemia, unspecified: Secondary | ICD-10-CM | POA: Diagnosis present

## 2017-08-19 DIAGNOSIS — T380X5A Adverse effect of glucocorticoids and synthetic analogues, initial encounter: Secondary | ICD-10-CM

## 2017-08-19 LAB — GLUCOSE, CAPILLARY
GLUCOSE-CAPILLARY: 118 mg/dL — AB (ref 70–99)
Glucose-Capillary: 196 mg/dL — ABNORMAL HIGH (ref 70–99)
Glucose-Capillary: 167 mg/dL — ABNORMAL HIGH (ref 70–99)

## 2017-08-19 MED ORDER — LEVOFLOXACIN 750 MG PO TABS
750.0000 mg | ORAL_TABLET | ORAL | Status: DC
  Administered 2017-08-19 – 2017-08-21 (×2): 750 mg via ORAL
  Filled 2017-08-19 (×2): qty 1

## 2017-08-19 MED ORDER — LORAZEPAM 2 MG/ML IJ SOLN
1.0000 mg | Freq: Four times a day (QID) | INTRAMUSCULAR | Status: DC | PRN
  Administered 2017-08-19 – 2017-08-21 (×3): 1 mg via INTRAVENOUS
  Filled 2017-08-19 (×3): qty 1

## 2017-08-19 MED ORDER — SODIUM CHLORIDE 0.9% FLUSH
10.0000 mL | INTRAVENOUS | Status: DC | PRN
  Administered 2017-08-20 (×2): 10 mL
  Administered 2017-08-22: 20 mL
  Filled 2017-08-19 (×3): qty 40

## 2017-08-19 MED ORDER — LORAZEPAM 2 MG/ML IJ SOLN: 0.5000 mg | Freq: Four times a day (QID) | INTRAMUSCULAR | Status: DC | PRN

## 2017-08-19 MED ORDER — INSULIN ASPART 100 UNIT/ML ~~LOC~~ SOLN
0.0000 [IU] | Freq: Three times a day (TID) | SUBCUTANEOUS | Status: DC
  Administered 2017-08-19 – 2017-08-20 (×3): 2 [IU] via SUBCUTANEOUS
  Administered 2017-08-21: 3 [IU] via SUBCUTANEOUS
  Administered 2017-08-22: 2 [IU] via SUBCUTANEOUS

## 2017-08-19 MED ORDER — IPRATROPIUM-ALBUTEROL 0.5-2.5 (3) MG/3ML IN SOLN
3.0000 mL | RESPIRATORY_TRACT | Status: DC
Start: 2017-08-19 — End: 2017-08-22
  Administered 2017-08-19 – 2017-08-22 (×18): 3 mL via RESPIRATORY_TRACT
  Filled 2017-08-19 (×18): qty 3

## 2017-08-19 MED ORDER — PREDNISONE 20 MG PO TABS
40.0000 mg | ORAL_TABLET | Freq: Every day | ORAL | Status: DC
  Administered 2017-08-20 – 2017-08-22 (×3): 40 mg via ORAL
  Filled 2017-08-19 (×3): qty 2

## 2017-08-19 MED ORDER — HYDRALAZINE HCL 20 MG/ML IJ SOLN
5.0000 mg | Freq: Four times a day (QID) | INTRAMUSCULAR | Status: DC | PRN
  Administered 2017-08-19: 10 mg via INTRAVENOUS
  Filled 2017-08-19: qty 1

## 2017-08-19 NOTE — Care Management Note (Signed)
Case Management Note  CM noted pt's return.  Pt has now had 20 ED visits and 15 admissions in the last 6 months.  On chart review CM noted that pt has either been inpt/obs or in the ED everyday since 06/21/2017 except 2-3 days.  Pt is a resident at [facility].  CM is aware of previous encounters that the staff were unable to help the pt with his O2 needs and DME issues.  Pt is active with [facility] but is not [facility].  CM discussed pt with Santiago Glad with [facility] who advises she believes one of the biggest issues is the facility.  She is aware of the [facility] request and pt will be discussed tomorrow at length of stay.  CM also noted that pt was now active with palliative care with [facility].  CM contacted Inez Catalina who advised she remembered the team speaking about the pt in the morning meeting but did not know much about him as he is relatively new on their serivces.  She advised they believe the facility is one of the issues as well.  The discussion included that the pt feels anxious about his care while at [facility] and on return the anxiety worsens his acute/chronic diseases so he returns.  They will begin working on finding a LTC SNF for the pt if he is agreeable.  CM will update inpt CM who will continue to follow.

## 2017-08-19 NOTE — Progress Notes (Addendum)
PROGRESS NOTE  [name] ZOX:[id number] DOB: 02-07-1960 DOA: 08/18/2017 PCP: Johnathan Joe, MD  HPI/Brief Narrative  [name] is a 57 y.o. 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  He presented with persistent dyspnea.  In the ED he was evaluated found to be tachypneic to 30s, tachycardic to 107 and placed on BiPAP and began to do somewhat well however when he began ambulating became more short of breath.  His lab work was notable for BNP of 175, WBC 22.5, chest x-ray showing right lower lobe airspace disease and small right and left pleural effusion unchanged from prior.  [facility] for management of COPD exacerbation    Subjective Still feels very short of breath. Particularly when exerting himself  Assessment/Plan: Principal Problem:   Acute on chronic respiratory failure with hypoxia (HCC) Active Problems:   Seizure (HCC)   HTN (hypertension)   COPD exacerbation (HCC)   CKD (chronic kidney disease), stage III (HCC)   Chronic combined systolic (congestive) and diastolic (congestive) heart failure (HCC)   History of pulmonary embolism   Recurrent pleural effusion on right   Protein-calorie malnutrition, severe   Acute on chronic respiratory failure with hypoxia COPD exacerbation - Currently on 4 L, typical home regimen to 3 L - Recent discharge on 7/28 for similar COPD exacerbation (previous completed 2 days Zithromax and was on prednisone taper prior to discharge) -Continue scheduled duo nebs -Status post IV Solu-Medrol, transition to oral prednisone burst on 7/21 - Add IV Levaquin, treat possible PNA ( R L opacity) and inflammatory changes with COPD exacerbation -Suspect anxiety contributing to dyspnea, palliative care recommended adding Ativan  as needed anxiety/air hunger on most recent admission, continue home Klonopin -Not interested in any BiPAP therapy  Steroid-induced hyperglycemia - Sliding scale insulin (sensitive)  Stage IV non-small cell lung cancer -Followed by Dr. Earlie [name], plan for chemotherapy in 1 week  Chronic combined systolic diastolic CHF -Continue home Lasix - Chest x-ray with unchanged small bilateral pleural effusion -Daily weights, monitor I's and O's  AKI on CKD stage III, stable -Creatinine baseline at baseline 1.4-1.5 -Currently 1.7 -Suspect prerenal likely related to dyspnea, decreased p.o. intake -Encourage oral hydration, will hold off any IV fluids given CHF history -Monitor BMP in a.m.  History of DVT/PE -continue Eliquis   Chronic anemia, secondary to iron deficiency - Continue ferrous sulfate  Hypertension, stable -Continue Norvasc Lasix -Addendum: Called to patient's bedside at 1530 due to increasing oxygen requirement to 6 L with O2 desaturation 79% blood pressure found to be 811 in the systolics.  Improved with breathing treatment.  Added PRN IV hydralazine.  Will need to continue to monitor   Code Status: DNR/DNI, confirmed with patient on 7/30.  Family Communication: No family at bedside  Disposition Plan: Scheduled duo nebs, transition to oral prednisone on 7/21.  Patient currently lives in [place] he will benefit from hospice services, those goals of care discussions were initiated during last admission with palliative care consultants   Consultants:  None     Procedures:  None  Antimicrobials: Anti-infectives (From admission, onward)   Start     Dose/Rate Route Frequency Ordered Stop   08/19/17 1200  levofloxacin (LEVAQUIN) tablet 750 mg     750 mg Oral Every 48 hours 08/19/17 1055  Cultures:    Telemetry:  DVT prophylaxis: Home Eliquis   Objective: Vitals:   08/18/17 2031 08/19/17 0502 08/19/17 0839 08/19/17 1208  BP: (!) 165/87 (!)  153/91    Pulse: 92 66 76 78  Resp: 18 (!) 22 20 20   Temp: 98 F (36.7 C) 97.8 F (36.6 C)    TempSrc: Oral Oral    SpO2: 100% 100% 99% 100%  Weight:      Height:        Intake/Output Summary (Last 24 hours) at 08/19/2017 1453 Last data filed at 08/19/2017 1150 Gross per 24 hour  Intake 240 ml  Output 750 ml  Net -510 ml   Filed Weights   08/18/17 1740  Weight: 53.6 kg (118 lb 2.7 oz)    Exam:  Constitutional: Thin male, no acute distress Eyes: EOMI, anicteric, normal conjunctivae ENMT: Oropharynx with moist mucous membranes, normal dentition Cardiovascular: RRR no MRGs, with no peripheral edema Respiratory: Normal respiratory effort on 4 L nasal cannula, able to speak in complete senses, tachypneic, decreased breath sounds at bases, occasional crackles in upper fields, no apparent wheezing Abdomen: Soft,non-tender, with no HSM Skin: No rash ulcers, or lesions. Without skin tenting  Neurologic: Grossly no focal neuro deficit. Psychiatric:Appropriate affect, and mood. Mental status AAOx3  Data Reviewed: CBC: Recent Labs  Lab 08/13/17 1527 08/15/17 0834 08/16/17 0500 08/17/17 0414 08/18/17 1242  WBC 13.3* 11.5* 20.0* 18.2* 22.5*  NEUTROABS 11.9*  --   --   --  19.9*  HGB 8.5* 7.7* 8.5* 7.9* 9.2*  HCT 26.8* 24.2* 27.2* 25.1* 29.6*  MCV 105.9* 104.3* 106.3* 106.8* 106.9*  PLT 208 222 275 262 163   Basic Metabolic Panel: Recent Labs  Lab 08/14/17 2300 08/15/17 0834 08/16/17 0500 08/17/17 0414 08/18/17 1242  NA 138 137 140 143 143  K 4.0 4.1 3.8 3.7 3.7  CL 93* 91* 88* 90* 94*  CO2 35* 36* 43* 41* 38*  GLUCOSE 317* 333* 313* 154* 146*  BUN 21* 19 23* 24* 29*  CREATININE 1.47* 1.31* 1.45* 1.51* 1.70*  CALCIUM 9.1 9.0 9.7 9.5 9.5   GFR: Estimated Creatinine Clearance: 35.9 mL/min (A) (by C-G formula based on SCr of 1.7 mg/dL (H)). Liver Function Tests: Recent Labs  Lab 08/13/17 1527 08/15/17 0834 08/16/17 0500 08/17/17 0414 08/18/17 1242  AST 26 19  17 17 29   ALT 22 17 16 14 18   ALKPHOS 111 96 98 98 115  BILITOT 0.4 0.4 0.4 0.4 0.5  PROT 6.9 6.0* 6.5 6.1* 7.0  ALBUMIN 3.5 3.4* 3.5 3.2* 3.5   Recent Labs  Lab 08/13/17 1527  LIPASE 24   No results for input(s): AMMONIA in the last 168 hours. Coagulation Profile: No results for input(s): INR, PROTIME in the last 168 hours. Cardiac Enzymes: No results for input(s): CKTOTAL, CKMB, CKMBINDEX, TROPONINI in the last 168 hours. BNP (last 3 results) No results for input(s): PROBNP in the last 8760 hours. HbA1C: No results for input(s): HGBA1C in the last 72 hours. CBG: Recent Labs  Lab 08/16/17 1715 08/16/17 2140 08/17/17 0749 08/17/17 1145 08/19/17 1139  GLUCAP 161* 149* 127* 183* 196*   Lipid Profile: No results for input(s): CHOL, HDL, LDLCALC, TRIG, CHOLHDL, LDLDIRECT in the last 72 hours. Thyroid Function Tests: No results for input(s): TSH, T4TOTAL, FREET4, T3FREE, THYROIDAB in the last 72 hours. Anemia Panel: No results for input(s): VITAMINB12, FOLATE, FERRITIN, TIBC, IRON, RETICCTPCT in the last 72 hours. Urine analysis:    Component Value Date/Time  COLORURINE YELLOW 07/16/2017 [place] 07/16/2017 1614   LABSPEC 1.011 07/16/2017 1614   PHURINE 6.0 07/16/2017 1614   GLUCOSEU NEGATIVE 07/16/2017 1614   HGBUR NEGATIVE 07/16/2017 1614   BILIRUBINUR SMALL (A) 07/16/2017 1614   KETONESUR NEGATIVE 07/16/2017 1614   PROTEINUR NEGATIVE 07/16/2017 1614   UROBILINOGEN 0.2 05/29/2011 0122   NITRITE NEGATIVE 07/16/2017 1614   LEUKOCYTESUR NEGATIVE 07/16/2017 1614   Sepsis Labs: @LABRCNTIP (procalcitonin:4,lacticidven:4)  ) Recent Results (from the past 240 hour(s))  MRSA PCR Screening     Status: None   Collection Time: 08/14/17  6:40 AM  Result Value Ref Range Status   MRSA by PCR NEGATIVE NEGATIVE Final    Comment:        The GeneXpert MRSA Assay (FDA approved for NASAL specimens only), is one component of a comprehensive MRSA  colonization surveillance program. It is not intended to diagnose MRSA infection nor to guide or monitor treatment for MRSA infections. Performed at [facility], [place] [address] [place], [place] [address]       Studies: No results found.  Scheduled Meds: . amLODipine  2.5 mg Oral Daily  . apixaban  5 mg Oral BID  . chlorpheniramine-HYDROcodone  5 mL Oral Q12H  . escitalopram  5 mg Oral QHS  . feeding supplement (ENSURE ENLIVE)  237 mL Oral TID BM  . feeding supplement (PRO-STAT SUGAR FREE 64)  30 mL Oral Daily  . ferrous sulfate  325 mg Oral BID WC  . folic acid  1 mg Oral Daily  . furosemide  40 mg Oral BID  . gabapentin  100 mg Oral TID  . guaiFENesin  1,200 mg Oral BID  . insulin aspart  0-9 Units Subcutaneous TID WC  . ipratropium-albuterol  3 mL Nebulization QID  . levofloxacin  750 mg Oral Q48H  . methylPREDNISolone (SOLU-MEDROL) injection  40 mg Intravenous Q12H  . metoCLOPramide  5 mg Oral TID  . metoprolol tartrate  50 mg Oral BID  . pantoprazole  40 mg Oral BID  . polyethylene glycol  17 g Oral Daily  . potassium chloride  20 mEq Oral Daily  . [START ON 08/20/2017] predniSONE  40 mg Oral Q breakfast  . senna-docusate  2 tablet Oral BID    Continuous Infusions:   LOS: 1 day     Desiree Hane, MD [facility] Pager [phone]  If 7PM-7AM, please contact night-coverage [email or web address] Password [email or web address] 08/19/2017, 2:53 PM

## 2017-08-19 NOTE — Progress Notes (Signed)
O2 77%-87% on 6 L oxygen.  Patient complain of not being able to breath or catch his breath.  BP 210/94, HR 89.  Respiratory and MD notified.

## 2017-08-20 DIAGNOSIS — J9621 Acute and chronic respiratory failure with hypoxia: Secondary | ICD-10-CM

## 2017-08-20 LAB — CBC
HCT: 27 % — ABNORMAL LOW (ref 39.0–52.0)
HEMOGLOBIN: 8.5 g/dL — AB (ref 13.0–17.0)
MCH: 33.5 pg (ref 26.0–34.0)
MCHC: 31.5 g/dL (ref 30.0–36.0)
MCV: 106.3 fL — ABNORMAL HIGH (ref 78.0–100.0)
PLATELETS: 285 10*3/uL (ref 150–400)
RBC: 2.54 MIL/uL — ABNORMAL LOW (ref 4.22–5.81)
RDW: 18.7 % — ABNORMAL HIGH (ref 11.5–15.5)
WBC: 21.9 10*3/uL — ABNORMAL HIGH (ref 4.0–10.5)

## 2017-08-20 LAB — BASIC METABOLIC PANEL
ANION GAP: 10 (ref 5–15)
BUN: 42 mg/dL — ABNORMAL HIGH (ref 6–20)
CO2: 40 mmol/L — ABNORMAL HIGH (ref 22–32)
Calcium: 9.7 mg/dL (ref 8.9–10.3)
Chloride: 92 mmol/L — ABNORMAL LOW (ref 98–111)
Creatinine, Ser: 1.78 mg/dL — ABNORMAL HIGH (ref 0.61–1.24)
GFR calc Af Amer: 47 mL/min — ABNORMAL LOW (ref >60)
GFR calc non Af Amer: 40 mL/min — ABNORMAL LOW (ref >60)
Glucose, Bld: 111 mg/dL — ABNORMAL HIGH (ref 70–99)
Potassium: 4 mmol/L (ref 3.5–5.1)
Sodium: 142 mmol/L (ref 135–145)

## 2017-08-20 LAB — GLUCOSE, CAPILLARY
Glucose-Capillary: 95 mg/dL (ref 70–99)
Glucose-Capillary: 169 mg/dL — ABNORMAL HIGH (ref 70–99)
Glucose-Capillary: 175 mg/dL — ABNORMAL HIGH (ref 70–99)
Glucose-Capillary: 145 mg/dL — ABNORMAL HIGH (ref 70–99)

## 2017-08-20 MED ORDER — HEPARIN SODIUM (PORCINE) 5000 UNIT/ML IJ SOLN: 5000.0000 [IU] | Freq: Three times a day (TID) | INTRAMUSCULAR | Status: DC

## 2017-08-20 NOTE — Progress Notes (Signed)
PROGRESS NOTE  [name] FKC:[id number] DOB: Mar 21, 1960 DOA: 08/18/2017 PCP: [name], MD  HPI/Brief Narrative  [name] is a 57 y.o. 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  He presented with persistent dyspnea.  In the ED he was evaluated found to be tachypneic to 30s, tachycardic to 107 and placed on BiPAP and began to do somewhat well however when he began ambulating became more short of breath.  His lab work was notable for BNP of 175, WBC 22.5, chest x-ray showing right lower lobe airspace disease and small right and left pleural effusion unchanged from prior.  He was [facility] for management of COPD exacerbation    Subjective Dyspnea on exertion persist, no fevers cough is better  Assessment/Plan: Principal Problem:   Acute on chronic respiratory failure with hypoxia (HCC) Active Problems:   Seizure (HCC)   HTN (hypertension)   COPD exacerbation (HCC)   CKD (chronic kidney disease), stage III (HCC)   Chronic combined systolic (congestive) and diastolic (congestive) heart failure (HCC)   Acute kidney injury superimposed on CKD (HCC)   History of pulmonary embolism   Recurrent pleural effusion on right   Protein-calorie malnutrition, severe   Steroid-induced hyperglycemia   Acute on chronic respiratory failure with hypoxia COPD exacerbation - O2 requirement is not back to baseline, prior to admission patient was on 3 L/min- Recent discharge on 7/28 for similar COPD exacerbation (previous completed 2 days Zithromax and was on prednisone taper prior to discharge) -Continue scheduled duo nebs -Continue oral prednisone burst on 7/21 - Continue Levaquin, treat possible PNA ( R L opacity) and inflammatory changes with COPD exacerbation -Suspect  anxiety contributing to dyspnea, palliative care recommended adding Ativan as needed anxiety/air hunger on most recent admission, continue home Klonopin -Patient declined CPAP and all any BiPAP therapy  Steroid-induced hyperglycemia-- - Use Novolog/Humalog Sliding scale insulin with Accu-Cheks/Fingersticks as ordered    Stage IV non-small cell lung cancer -Followed by Dr. Earlie Server, plan for chemotherapy in 1 week  Chronic combined systolic diastolic CHF -Continue home Lasix - Chest x-ray with unchanged small bilateral pleural effusion, right lower lobe opacity, query pneumonia -Daily weights, monitor I's and O's  AKI on CKD stage III, stable -Creatinine baseline at baseline 1.4-1.5 -Creatinine is still I n the scale 1.7 -Suspect prerenal likely related to dyspnea, decreased p.o. intake -Encourage oral hydration, will hold off any IV fluids given CHF history -Monitor BMP in a.m.  History of DVT/PE -continue Eliquis   Chronic anemia, secondary to iron deficiency - Continue ferrous sulfate  Hypertension, stable -Continue Norvasc Lasix -Addendum: Called to patient's bedside at 1530 due to increasing oxygen requirement to 6 L with O2 desaturation 79% blood pressure found to be 749 in the systolics.  Improved with breathing treatment.  Added PRN IV hydralazine.  Will need to continue to monitor   Code Status: DNR/DNI,   Family Communication: No family at bedside  Disposition Plan:   Patient currently lives in ALF he will benefit from hospice services, those goals of care discussions were initiated during last admission with palliative care consultants   Consultants:  None     Procedures:  None  Antimicrobials: Anti-infectives (From admission, onward)   Start     Dose/Rate Route Frequency Ordered Stop   08/19/17 1200  levofloxacin (LEVAQUIN) tablet 750 mg     750 mg Oral Every 48 hours 08/19/17 1055          Cultures:    Telemetry:  DVT prophylaxis: Home  Eliquis   Objective: Vitals:   08/20/17 0614 08/20/17 0819 08/20/17 1410 08/20/17 1601  BP: 128/73  128/86   Pulse: 79  86   Resp: 16     Temp: 98.2 F (36.8 C)  98.3 F (36.8 C)   TempSrc:   Oral   SpO2: 100% 99% 100% 100%  Weight:      Height:        Intake/Output Summary (Last 24 hours) at 08/20/2017 1731 Last data filed at 08/20/2017 1500 Gross per 24 hour  Intake 490 ml  Output 3000 ml  Net -2510 ml   Filed Weights   08/18/17 1740  Weight: 53.6 kg (118 lb 2.7 oz)    Exam:  Constitutional: Thin male, no acute distress Nose- [place] 4 to 5 L/min Eyes: EOMI, anicteric, normal conjunctivae ENMT: Oropharynx with moist mucous membranes, normal dentition Cardiovascular: RRR no MRGs, with no peripheral edema Respiratory: Decreased breath sounds in bases, mild tachypnea, no wheezing, scattered rhonchi noted  abdomen: Soft,non-tender, with no HSM Skin: No rash ulcers, or lesions. Without skin tenting  Neurologic: Grossly no focal neuro deficit. Psychiatric:Appropriate affect, and mood. Mental status AAOx3  Data Reviewed: CBC: Recent Labs  Lab 08/15/17 0834 08/16/17 0500 08/17/17 0414 08/18/17 1242 08/20/17 0332  WBC 11.5* 20.0* 18.2* 22.5* 21.9*  NEUTROABS  --   --   --  19.9*  --   HGB 7.7* 8.5* 7.9* 9.2* 8.5*  HCT 24.2* 27.2* 25.1* 29.6* 27.0*  MCV 104.3* 106.3* 106.8* 106.9* 106.3*  PLT 222 275 262 291 007   Basic Metabolic Panel: Recent Labs  Lab 08/15/17 0834 08/16/17 0500 08/17/17 0414 08/18/17 1242 08/20/17 0332  NA 137 140 143 143 142  K 4.1 3.8 3.7 3.7 4.0  CL 91* 88* 90* 94* 92*  CO2 36* 43* 41* 38* 40*  GLUCOSE 333* 313* 154* 146* 111*  BUN 19 23* 24* 29* 42*  CREATININE 1.31* 1.45* 1.51* 1.70* 1.78*  CALCIUM 9.0 9.7 9.5 9.5 9.7   GFR: Estimated Creatinine Clearance: 34.3 mL/min (A) (by C-G formula based on SCr of 1.78 mg/dL (H)). Liver Function Tests: Recent Labs  Lab 08/15/17 0834 08/16/17 0500 08/17/17 0414 08/18/17 1242  AST 19  17 17 29   ALT 17 16 14 18   ALKPHOS 96 98 98 115  BILITOT 0.4 0.4 0.4 0.5  PROT 6.0* 6.5 6.1* 7.0  ALBUMIN 3.4* 3.5 3.2* 3.5   No results for input(s): LIPASE, AMYLASE in the last 168 hours. No results for input(s): AMMONIA in the last 168 hours. Coagulation Profile: No results for input(s): INR, PROTIME in the last 168 hours. Cardiac Enzymes: No results for input(s): CKTOTAL, CKMB, CKMBINDEX, TROPONINI in the last 168 hours. BNP (last 3 results) No results for input(s): PROBNP in the last 8760 hours. HbA1C: No results for input(s): HGBA1C in the last 72 hours. CBG: Recent Labs  Lab 08/19/17 1646 08/19/17 2100 08/20/17 0736 08/20/17 1113 08/20/17 1644  GLUCAP 118* 167* 95 169* 175*   Lipid Profile: No results for input(s): CHOL, HDL, LDLCALC, TRIG, CHOLHDL, LDLDIRECT in the last 72 hours. Thyroid Function Tests: No results for input(s): TSH, T4TOTAL, FREET4, T3FREE, THYROIDAB in the last 72 hours. Anemia Panel: No results for input(s): VITAMINB12, FOLATE, FERRITIN, TIBC, IRON, RETICCTPCT in the last 72 hours. Urine  analysis:    Component Value Date/Time   COLORURINE YELLOW 07/16/2017 [place] 07/16/2017 1614   LABSPEC 1.011 07/16/2017 1614   PHURINE 6.0 07/16/2017 1614   GLUCOSEU NEGATIVE 07/16/2017 1614   HGBUR NEGATIVE 07/16/2017 1614   BILIRUBINUR SMALL (A) 07/16/2017 1614   KETONESUR NEGATIVE 07/16/2017 1614   PROTEINUR NEGATIVE 07/16/2017 1614   UROBILINOGEN 0.2 05/29/2011 0122   NITRITE NEGATIVE 07/16/2017 1614   LEUKOCYTESUR NEGATIVE 07/16/2017 1614   Sepsis Labs: @LABRCNTIP (procalcitonin:4,lacticidven:4)  ) Recent Results (from the past 240 hour(s))  MRSA PCR Screening     Status: None   Collection Time: 08/14/17  6:40 AM  Result Value Ref Range Status   MRSA by PCR NEGATIVE NEGATIVE Final    Comment:        The GeneXpert MRSA Assay (FDA approved for NASAL specimens only), is one component of a comprehensive MRSA  colonization surveillance program. It is not intended to diagnose MRSA infection nor to guide or monitor treatment for MRSA infections. Performed at [facility], [place] [address] [place], [place] [address]       Studies: No results found.  Scheduled Meds: . amLODipine  2.5 mg Oral Daily  . apixaban  5 mg Oral BID  . chlorpheniramine-HYDROcodone  5 mL Oral Q12H  . escitalopram  5 mg Oral QHS  . feeding supplement (ENSURE ENLIVE)  237 mL Oral TID BM  . feeding supplement (PRO-STAT SUGAR FREE 64)  30 mL Oral Daily  . ferrous sulfate  325 mg Oral BID WC  . folic acid  1 mg Oral Daily  . furosemide  40 mg Oral BID  . gabapentin  100 mg Oral TID  . guaiFENesin  1,200 mg Oral BID  . insulin aspart  0-9 Units Subcutaneous TID WC  . ipratropium-albuterol  3 mL Nebulization Q4H  . levofloxacin  750 mg Oral Q48H  . metoCLOPramide  5 mg Oral TID  . metoprolol tartrate  50 mg Oral BID  . pantoprazole  40 mg Oral BID  . polyethylene glycol  17 g Oral Daily  . potassium chloride  20 mEq Oral Daily  . predniSONE  40 mg Oral Q breakfast  . senna-docusate  2 tablet Oral BID    Continuous Infusions:   LOS: 2 days     Roxan Hockey, MD [facility]  If 7PM-7AM, please contact night-coverage [email or web address] Password [email or web address] 08/20/2017, 5:31 PM

## 2017-08-21 ENCOUNTER — Encounter: Payer: Self-pay | Admitting: Licensed Clinical Social Worker

## 2017-08-21 LAB — CBC
HCT: 27.8 % — ABNORMAL LOW (ref 39.0–52.0)
HEMOGLOBIN: 8.7 g/dL — AB (ref 13.0–17.0)
MCH: 33.1 pg (ref 26.0–34.0)
MCHC: 31.3 g/dL (ref 30.0–36.0)
MCV: 105.7 fL — ABNORMAL HIGH (ref 78.0–100.0)
Platelets: 291 10*3/uL (ref 150–400)
RBC: 2.63 MIL/uL — AB (ref 4.22–5.81)
RDW: 18.4 % — ABNORMAL HIGH (ref 11.5–15.5)
WBC: 20.2 10*3/uL — ABNORMAL HIGH (ref 4.0–10.5)

## 2017-08-21 LAB — BASIC METABOLIC PANEL WITH GFR
Anion gap: 12 (ref 5–15)
BUN: 39 mg/dL — ABNORMAL HIGH (ref 6–20)
CO2: 39 mmol/L — ABNORMAL HIGH (ref 22–32)
Calcium: 9.6 mg/dL (ref 8.9–10.3)
Chloride: 91 mmol/L — ABNORMAL LOW (ref 98–111)
Creatinine, Ser: 1.63 mg/dL — ABNORMAL HIGH (ref 0.61–1.24)
GFR calc Af Amer: 52 mL/min — ABNORMAL LOW
GFR calc non Af Amer: 45 mL/min — ABNORMAL LOW
Glucose, Bld: 120 mg/dL — ABNORMAL HIGH (ref 70–99)
Potassium: 3.4 mmol/L — ABNORMAL LOW (ref 3.5–5.1)
Sodium: 142 mmol/L (ref 135–145)

## 2017-08-21 LAB — GLUCOSE, CAPILLARY
GLUCOSE-CAPILLARY: 91 mg/dL (ref 70–99)
GLUCOSE-CAPILLARY: 229 mg/dL — AB (ref 70–99)
GLUCOSE-CAPILLARY: 277 mg/dL — AB (ref 70–99)
GLUCOSE-CAPILLARY: 142 mg/dL — AB (ref 70–99)

## 2017-08-21 MED ORDER — AMOXICILLIN-POT CLAVULANATE 500-125 MG PO TABS
1.0000 | ORAL_TABLET | Freq: Three times a day (TID) | ORAL | Status: DC
  Administered 2017-08-22: 500 mg via ORAL
  Filled 2017-08-21 (×4): qty 1

## 2017-08-21 MED ORDER — DEXTROMETHORPHAN POLISTIREX ER 30 MG/5ML PO SUER
30.0000 mg | Freq: Two times a day (BID) | ORAL | Status: DC | PRN
  Filled 2017-08-21: qty 5

## 2017-08-21 MED ORDER — DM-GUAIFENESIN ER 30-600 MG PO TB12
2.0000 | ORAL_TABLET | Freq: Two times a day (BID) | ORAL | Status: DC | PRN
  Administered 2017-08-21: 2 via ORAL
  Filled 2017-08-21 (×2): qty 2

## 2017-08-21 NOTE — Progress Notes (Signed)
Pt refused all nighttime meds. Pt states that he is upset about not being discharged today. Writer educated pt on the importance of taking medication, pt still refused.

## 2017-08-21 NOTE — Clinical Social Work Note (Signed)
Clinical Social Work Assessment  Patient Details  Name: [name] MRN: [id number] Date of Birth: Mar 14, 1960  Date of referral:  08/21/17               Reason for consult:  Discharge Planning                Permission sought to share information with:  Facility Sport and exercise psychologist, Case Optician, dispensing granted to share information::  Yes, Verbal Permission Granted  Name::        Agency::  [facility]  Relationship::     Contact Information:     Housing/Transportation Living arrangements for the past 2 months:  [place] of Information:  Patient, Medical Team Patient Interpreter Needed:  None Criminal Activity/Legal Involvement Pertinent to Current Situation/Hospitalization:  No - Comment as needed Significant Relationships:  Parents, Warehouse manager Lives with:  Facility Resident Do you feel safe going back to the place where you live?  Yes Need for family participation in patient care:  Yes (Comment)  Care giving concerns:  Patient has lung cancer and reports that he still smokes.    Social Worker assessment / plan:  LCSW consulted for facility placement.   Patient is a resident at [facility]. Patient will return to facility with palliative.   Patient admitted to  Hospital for Acute chronic respiratory failure with hypoxia. Patient was in ICU for about a week prior to coming to the floor.   Patient has been admitted 15 times in the past 6 months and has been assessed on several occasion.   Patient is agreeable to outpatient palliative follow up on this admission.   PLAN: return to [name] with palliative to follow.    Employment status:  Disabled (Comment on whether or not currently receiving Disability) Insurance information:  Medicaid [place] PT Recommendations:  Not assessed at this time Information / Referral to community resources:     Patient/Family's Response to care:  Patient is thankful foe LCSW dc coordination.    Patient/Family's Understanding of and Emotional Response to Diagnosis, Current Treatment, and Prognosis:  Patient is knowledgeable about his current condition and has finally accepted palliative follow up. Patient has declined palliative in the pass. Patient reports that he continues to smoke considering is current condition.    Emotional Assessment Appearance:  Appears stated age Attitude/Demeanor/Rapport:    Affect (typically observed):    Orientation:  Oriented to Self, Oriented to  Time, Oriented to Place, Oriented to Situation Alcohol / Substance use:  Tobacco Use Psych involvement (Current and /or in the community):  No (Comment)  Discharge Needs  Concerns to be addressed:  No discharge needs identified Readmission within the last 30 days:  No Current discharge risk:  Chronically ill Barriers to Discharge:  No Barriers Identified   Servando Snare, LCSW 08/21/2017, 11:37 AM

## 2017-08-21 NOTE — NC FL2 (Addendum)
[place] LEVEL OF CARE SCREENING TOOL     IDENTIFICATION  Patient Name: [name] Birthdate: 08-19-1960 Sex: male Admission Date (Current Location): 08/18/2017  [facility] and [place] Number:  Herbalist and Address:  [facility],  [place] [address], [place]      Provider Number: [id number]  Attending Physician Name and Address:  Roxan Hockey, MD  Relative Name and Phone Number:       Current Level of Care: Hospital Recommended Level of Care: [place] Prior Approval Number:    Date Approved/Denied:   PASRR Number: [id number]  Discharge Plan: Other (Comment)    Current Diagnoses: Patient Active Problem List   Diagnosis Date Noted  . Steroid-induced hyperglycemia 08/19/2017  . Acute on chronic respiratory failure with hypoxia and hypercapnia ([place]) 08/13/2017  . Protein-calorie malnutrition, severe 07/31/2017  . Thrombocytopenia ([place]) 07/29/2017  . Wheezing on exhalation 07/29/2017  . Anemia 07/24/2017  . Normocytic anemia 07/24/2017  . Chronic diastolic (congestive) heart failure ([place]) 07/24/2017  . Recurrent pleural effusion on right 07/15/2017  . Acute on chronic respiratory failure ([place]) 07/11/2017  . History of pulmonary embolism 07/11/2017  . PE (pulmonary thromboembolism) ([place]) 06/30/2017  . Acute kidney injury superimposed on CKD ([place]) 06/25/2017  . Dyspnea 06/21/2017  . Chronic combined systolic (congestive) and diastolic (congestive) heart failure ([place])   . Constipation 06/07/2017  . Pain   . Acute on chronic respiratory failure with hypoxia ([place]) 05/28/2017  . Chronic respiratory failure with hypoxia ([place]) 04/01/2017  . Leucocytosis 03/16/2017  . CKD (chronic kidney disease), stage III ([place]) 03/12/2017  . Malnutrition of moderate degree 03/10/2017  . COPD exacerbation ([place]) 03/09/2017  . Macrocytic anemia 02/07/2017  . Severe malnutrition ([place]) 01/03/2017  . DOE (dyspnea on exertion)  12/30/2016  . Nausea   . Hypervolemia   . HCAP (healthcare-associated pneumonia) 12/19/2016  . SOB (shortness of breath) 12/19/2016  . Erosive gastropathy 12/18/2016  . Gastritis 12/18/2016  . Hematemesis 12/16/2016  . Intractable nausea and vomiting 12/16/2016  . HTN (hypertension) 10/30/2016  . Chronic obstructive pulmonary disease ([place]) 09/02/2016  . COPD (chronic obstructive pulmonary disease) ([place]) 08/19/2016  . Dehydration 06/04/2016  . Abdominal pain 06/04/2016  . 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 Adenocarcinoma of left lung, stage 4 ([place]) 05/02/2016  . Goals of care, counseling/discussion 05/02/2016  . Encounter for antineoplastic chemotherapy 05/02/2016  . Tobacco abuse 05/30/2011  . Alcohol abuse 05/30/2011  . Seizure ([place]) 05/28/2011    Orientation RESPIRATION BLADDER Height & Weight     Self, Time, Situation, Place  O2 Continent Weight: 118 lb 2.7 oz (53.6 kg) Height:  5\' 11"  (180.3 cm)  BEHAVIORAL SYMPTOMS/MOOD NEUROLOGICAL BOWEL NUTRITION STATUS      Continent    AMBULATORY STATUS COMMUNICATION OF NEEDS Skin   Supervision Verbally Normal                       Personal Care Assistance Level of Assistance  Bathing, Feeding, Dressing Bathing Assistance: Limited assistance Feeding assistance: Independent Dressing Assistance: Limited assistance     Functional Limitations Info  Sight, Hearing, Speech Sight Info: Adequate Hearing Info: Adequate Speech Info: Adequate    SPECIAL CARE FACTORS FREQUENCY                       Contractures Contractures Info: Not present    Additional Factors Info  Code Status, Allergies Code Status Info: DNR Allergies  Info: NKA           Current Medications (08/21/2017):  This is the current hospital active medication list Current Facility-Administered Medications  Medication Dose Route Frequency Provider Last Rate Last Dose  . albuterol (PROVENTIL) (2.5 MG/3ML) 0.083% nebulizer solution 2.5 mg  2.5 mg  Nebulization Q2H PRN Debbe Odea, MD   2.5 mg at 08/19/17 1751  . amLODipine (NORVASC) tablet 2.5 mg  2.5 mg Oral Daily Debbe Odea, MD   2.5 mg at 08/21/17 0909  . apixaban (ELIQUIS) tablet 5 mg  5 mg Oral BID Debbe Odea, MD   5 mg at 08/21/17 0908  . chlorpheniramine-HYDROcodone (TUSSIONEX) 10-8 MG/5ML suspension 5 mL  5 mL Oral Q12H Debbe Odea, MD   5 mL at 08/21/17 0912  . dextromethorphan (DELSYM) 30 MG/5ML liquid 30 mg  30 mg Oral BID PRN Lenis Noon, RPH      . escitalopram (LEXAPRO) tablet 5 mg  5 mg Oral QHS Debbe Odea, MD   5 mg at 08/20/17 2141  . feeding supplement (ENSURE ENLIVE) (ENSURE ENLIVE) liquid 237 mL  237 mL Oral TID BM Rizwan, Saima, MD   237 mL at 08/20/17 1116  . feeding supplement (PRO-STAT SUGAR FREE 64) liquid 30 mL  30 mL Oral Daily Rizwan, Saima, MD      . ferrous sulfate tablet 325 mg  325 mg Oral BID WC Debbe Odea, MD   325 mg at 08/21/17 0828  . folic acid (FOLVITE) tablet 1 mg  1 mg Oral Daily Rizwan, Eunice Blase, MD   1 mg at 08/21/17 0911  . furosemide (LASIX) tablet 40 mg  40 mg Oral BID Debbe Odea, MD   40 mg at 08/21/17 0828  . gabapentin (NEURONTIN) capsule 100 mg  100 mg Oral TID Debbe Odea, MD   100 mg at 08/21/17 0911  . guaiFENesin (MUCINEX) 12 hr tablet 1,200 mg  1,200 mg Oral BID Debbe Odea, MD   1,200 mg at 08/21/17 0909  . hydrALAZINE (APRESOLINE) injection 5-10 mg  5-10 mg Intravenous Q6H PRN Oretha Milch D, MD   10 mg at 08/19/17 1545  . hydrOXYzine (ATARAX/VISTARIL) tablet 10 mg  10 mg Oral TID PRN Debbe Odea, MD   10 mg at 08/19/17 1644  . insulin aspart (novoLOG) injection 0-9 Units  0-9 Units Subcutaneous TID WC Oretha Milch D, MD   2 Units at 08/20/17 1739  . ipratropium-albuterol (DUONEB) 0.5-2.5 (3) MG/3ML nebulizer solution 3 mL  3 mL Nebulization Q4H Oretha Milch D, MD   3 mL at 08/21/17 0800  . levofloxacin (LEVAQUIN) tablet 750 mg  750 mg Oral Q48H Emokpae, Courage, MD   750 mg at 08/19/17 1129  . LORazepam  (ATIVAN) injection 1 mg  1 mg Intravenous Q6H PRN Oretha Milch D, MD   1 mg at 08/20/17 1452  . metoCLOPramide (REGLAN) tablet 5 mg  5 mg Oral TID Debbe Odea, MD   5 mg at 08/21/17 0829  . metoprolol tartrate (LOPRESSOR) tablet 50 mg  50 mg Oral BID Debbe Odea, MD   50 mg at 08/21/17 0911  . ondansetron (ZOFRAN) tablet 4 mg  4 mg Oral Q6H PRN Debbe Odea, MD      . oxyCODONE (Oxy IR/ROXICODONE) immediate release tablet 10 mg  10 mg Oral Q4H PRN Debbe Odea, MD   10 mg at 08/21/17 0622  . pantoprazole (PROTONIX) EC tablet 40 mg  40 mg Oral BID Debbe Odea, MD   40 mg at  08/21/17 0908  . polyethylene glycol (MIRALAX / GLYCOLAX) packet 17 g  17 g Oral Daily Rizwan, Saima, MD      . potassium chloride (K-DUR) CR tablet 20 mEq  20 mEq Oral Daily Debbe Odea, MD   20 mEq at 08/21/17 0910  . predniSONE (DELTASONE) tablet 40 mg  40 mg Oral Q breakfast Oretha Milch D, MD   40 mg at 08/21/17 0828  . senna-docusate (Senokot-S) tablet 2 tablet  2 tablet Oral BID Debbe Odea, MD   2 tablet at 08/20/17 2140  . sodium chloride flush (NS) 0.9 % injection 10-40 mL  10-40 mL Intracatheter PRN Oretha Milch D, MD   10 mL at 08/20/17 1646     Discharge Medications: STOP taking these medications   amLODipine 2.5 MG tablet Commonly known as:  NORVASC   azithromycin 250 MG tablet Commonly known as:  ZITHROMAX   escitalopram 5 MG tablet Commonly known as:  LEXAPRO   furosemide 40 MG tablet Commonly known as:  LASIX     TAKE these medications   albuterol (2.5 MG/3ML) 0.083% nebulizer solution Commonly known as:  PROVENTIL 1 neb every 4-6 hours as needed for wheezing and shortness of breath What changed:    how much to take  how to take this  when to take this  reasons to take this  additional instructions   albuterol (2.5 MG/3ML) 0.083% nebulizer solution Commonly known as:  PROVENTIL Take 3 mLs (2.5 mg total) by nebulization every 6 (six) hours as needed for wheezing  or shortness of breath. What changed:    Another medication with the same name was added. Make sure you understand how and when to take each.  Another medication with the same name was changed. Make sure you understand how and when to take each.   albuterol (2.5 MG/3ML) 0.083% nebulizer solution Commonly known as:  PROVENTIL Take 3 mLs (2.5 mg total) by nebulization every 2 (two) hours as needed for wheezing or shortness of breath. What changed:  You were already taking a medication with the same name, and this prescription was added. Make sure you understand how and when to take each.   ALKA-SELTZER PLUS COLD PO Take 2 tablets by mouth at bedtime as needed (COUGH).   amoxicillin-clavulanate 500-125 MG tablet Commonly known as:  AUGMENTIN Take 1 tablet (500 mg total) by mouth 3 (three) times daily for 7 days.   chlorpheniramine-HYDROcodone 10-8 MG/5ML Suer Commonly known as:  TUSSIONEX PENNKINETIC ER Take 5 mLs by mouth every 12 (twelve) hours as needed for cough. What changed:    medication strength  when to take this  reasons to take this   clonazePAM 0.5 MG tablet Commonly known as:  KLONOPIN Take 0.5 tablets (0.25 mg total) by mouth 2 (two) times daily as needed for anxiety.   dextromethorphan 30 MG/5ML liquid Commonly known as:  DELSYM Take 5 mLs (30 mg total) by mouth 2 (two) times daily as needed for cough.   ELIQUIS 5 MG Tabs tablet Generic drug:  apixaban Take 5 mg by mouth 2 (two) times daily.   feeding supplement (ENSURE ENLIVE) Liqd Take 237 mLs by mouth 3 (three) times daily between meals.   feeding supplement (PRO-STAT SUGAR FREE 64) Liqd Take 30 mLs by mouth daily.   ferrous sulfate 325 (65 FE) MG tablet Take 1 tablet (325 mg total) by mouth 2 (two) times daily with a meal.   Fluticasone-Umeclidin-Vilant 100-62.5-25 MCG/INH Aepb Commonly known as:  TRELEGY ELLIPTA  Inhale 1 puff into the lungs daily.   folic acid 1 MG tablet Commonly  known as:  FOLVITE Take 1 tablet (1 mg total) by mouth daily.   gabapentin 100 MG capsule Commonly known as:  NEURONTIN Take 1 capsule (100 mg total) by mouth 3 (three) times daily.   hydrOXYzine 10 MG tablet Commonly known as:  ATARAX/VISTARIL Take 1 tablet (10 mg total) by mouth 3 (three) times daily as needed for anxiety.   insulin aspart 100 UNIT/ML injection Commonly known as:  novoLOG 0-5 Units, Subcutaneous, Daily at bedtime, First dose on Fri 08/15/17 at 2200 Correction coverage: HS scale CBG < 70: implement hypoglycemia protocol CBG 70 - 120: 0 units CBG 121 - 150: 0 units CBG 151 - 200: 0 units CBG 201 - 250: 2 units CBG 251 - 300: 3 units CBG 301 - 350: 4 units CBG 351 - 400: 5 units CBG > 400: call MD and obtain STAT lab verification   insulin aspart 100 UNIT/ML injection Commonly known as:  novoLOG Please inject  0-9 Units, Subcutaneous, 3 times daily with meals, First dose on Fri 08/15/17 at 1700 Correction coverage: Sensitive (thin, NPO, renal) CBG < 70: implement hypoglycemia protocol CBG 70 - 120: 0 units CBG 121 - 150: 1 unit CBG 151 - 200: 2 units CBG 201 - 250: 3 units CBG 251 - 300: 5 units CBG 301 - 350: 7 units CBG 351 - 400: 9 units CBG > 400: call MD and obtain STAT lab verification   loratadine 10 MG tablet Commonly known as:  CLARITIN Take 10 mg by mouth daily.   metoCLOPramide 5 MG tablet Commonly known as:  REGLAN Take 1 tablet (5 mg total) by mouth 3 (three) times daily.   metoprolol tartrate 50 MG tablet Commonly known as:  LOPRESSOR Take 1 tablet (50 mg total) by mouth 2 (two) times daily.   mirtazapine 15 MG tablet Commonly known as:  REMERON Take 1 tablet (15 mg total) by mouth at bedtime.   MUCINEX 600 MG 12 hr tablet Generic drug:  guaiFENesin Take 1,200 mg by mouth 2 (two) times daily. What changed:  Another medication with the same name was added. Make sure you understand how and when to take each.    guaiFENesin 600 MG 12 hr tablet Commonly known as:  MUCINEX Take 2 tablets (1,200 mg total) by mouth 2 (two) times daily. What changed:  You were already taking a medication with the same name, and this prescription was added. Make sure you understand how and when to take each.   ondansetron 4 MG tablet Commonly known as:  ZOFRAN Take 1 tablet (4 mg total) by mouth every 6 (six) hours as needed for nausea.   Oxycodone HCl 10 MG Tabs Take 1 tablet (10 mg total) by mouth every 4 (four) hours as needed (pain).   pantoprazole 40 MG tablet Commonly known as:  PROTONIX Take 1 tablet (40 mg total) by mouth 2 (two) times daily.   polyethylene glycol packet Commonly known as:  MIRALAX / GLYCOLAX Take 17 g by mouth daily.   potassium chloride 10 MEQ tablet Commonly known as:  K-DUR Take 2 tablets (20 mEq total) by mouth daily.   predniSONE 20 MG tablet Commonly known as:  DELTASONE Take 1 tablet (20 mg total) by mouth daily with breakfast. Start taking on:  08/23/2017 What changed:    how much to take  how to take this  when to take this  additional  instructions   senna-docusate 8.6-50 MG tablet Commonly known as:  Senokot-S Take 2 tablets by mouth 2 (two) times daily.   sucralfate 1 GM/10ML suspension Commonly known as:  CARAFATE Take 10 mLs (1 g total) by mouth 4 (four) times daily -  with meals and at bedtime.   THERA-TABS PO Take 1 tablet by mouth daily.      Relevant Imaging Results:  Relevant Lab Results:   Additional Information SSN [id number] return with palliative to follow  Servando Snare, LCSW

## 2017-08-21 NOTE — Progress Notes (Signed)
SATURATION QUALIFICATIONS: (This note is used to comply with regulatory documentation for home oxygen)  Patient Saturations on 3L = 100%   Patient Saturations on 3 Liters of oxygen while Ambulating =93%    Virginia Rochester, RN

## 2017-08-21 NOTE — Progress Notes (Signed)
Patient became short of breath, coughing harder than usual, extremely anxious.  Oxygen saturation checked.  The started at 89% on 3L.on primary evaluation, rising back to 100%.  Prn for anxiety given, MD notified.

## 2017-08-21 NOTE — Progress Notes (Signed)
PROGRESS NOTE  [name] VQQ:[id number] DOB: 12-Dec-1960 DOA: 08/18/2017 PCP: [name], MD  HPI/Brief Narrative  [name] is a 57 y.o. year old male with medical history significant for severe COPD on chronic O2, PE, HTN, CKD stage III, who presented on 08/18/2017 after being recently discharged from [place] [facility] (admission from 7/24-7/28 for COPD exacerbation).  He presented with persistent dyspnea.  In the ED he was evaluated found to be tachypneic to 30s, tachycardic to 107 and placed on BiPAP and began to do somewhat well however when he began ambulating became more short of breath.  His lab work was notable for BNP of 175, WBC 22.5, chest x-ray showing right lower lobe airspace disease and small right and left pleural effusion unchanged from prior.  [facility] for management of COPD exacerbation    Subjective Around 1:30 PM patient had an episode of respiratory distress question of a possible aspiration,  hypoxia got worse temporarily  Assessment/Plan: Principal Problem:   Acute on chronic respiratory failure with hypoxia (HCC) Active Problems:   Seizure (HCC)   HTN (hypertension)   COPD exacerbation (HCC)   CKD (chronic kidney disease), stage III (HCC)   Chronic combined systolic (congestive) and diastolic (congestive) heart failure (HCC)   Acute kidney injury superimposed on CKD (HCC)   History of pulmonary embolism   Recurrent pleural effusion on right   Protein-calorie malnutrition, severe   Steroid-induced hyperglycemia   Acute on chronic respiratory failure with hypoxia COPD exacerbation - O2 requirement is not back to baseline, prior to admission patient was on 3 L/min- Recent discharge on 7/28 for similar COPD exacerbation (previous completed 2 days Zithromax and was on prednisone taper prior to discharge) -Continue scheduled duo nebs -Continue oral prednisone burst on 7/21 On 08/21/17 patient had an episode of  respiratory distress question of a possible aspiration,  hypoxia got worse temporarily, stop Levaquin, waiting for possible aspiration pneumonia, treat possible PNA ( R L opacity) and inflammatory changes with COPD exacerbation -Suspect anxiety contributing to dyspnea, palliative care recommended adding Ativan as needed anxiety/air hunger on most recent admission, continue home Klonopin -Patient declined CPAP and all any BiPAP therapy  Steroid-induced hyperglycemia-- - Use Novolog/Humalog Sliding scale insulin with Accu-Cheks/Fingersticks as ordered    Stage IV non-small cell lung cancer -Followed by Dr. Earlie Server, plan for chemotherapy in 1 week  Chronic combined systolic diastolic CHF -Continue home Lasix - Chest x-ray with unchanged small bilateral pleural effusion, right lower lobe opacity, query pneumonia -Daily weights, monitor I's and O's  AKI on CKD stage III, stable -Creatinine baseline at baseline 1.4-1.5 -Creatinine is still I n the scale 1.7 -Suspect prerenal likely related to dyspnea, decreased p.o. intake -Encourage oral hydration, will hold off any IV fluids given CHF history -Monitor BMP in a.m.  History of DVT/PE -continue Eliquis   Chronic anemia, secondary to iron deficiency - Continue ferrous sulfate  Hypertension, stable -Continue Norvasc Lasix -Addendum: Called to patient's bedside at 1530 due to increasing oxygen requirement to 6 L with O2 desaturation 79% blood pressure found to be 433 in the systolics.  Improved with breathing treatment.  Added PRN IV hydralazine.  Will need to continue to monitor   Code Status: DNR/DNI,   Family Communication: No family at bedside  Disposition Plan:   Patient currently lives in [place] he will benefit from hospice services, those goals of care discussions were initiated during last admission with palliative care consultants, hold  dc due to episode of respiratory distress with worsening hypoxia and concerns about  aspiration   Consultants:  None     Procedures:  None  Antimicrobials: Anti-infectives (From admission, onward)   Start     Dose/Rate Route Frequency Ordered Stop   08/19/17 1200  levofloxacin (LEVAQUIN) tablet 750 mg     750 mg Oral Every 48 hours 08/19/17 1055 08/25/17 2359        Cultures:    Telemetry:  DVT prophylaxis: Home Eliquis   Objective: Vitals:   08/20/17 2351 08/21/17 0309 08/21/17 0446 08/21/17 1412  BP:   (!) 145/79 (!) 135/93  Pulse:   89 83  Resp:   (!) 24 16  Temp:   98.6 F (37 C) 98.3 F (36.8 C)  TempSrc:   Oral Oral  SpO2: 100% 98% 100% 100%  Weight:      Height:        Intake/Output Summary (Last 24 hours) at 08/21/2017 1852 Last data filed at 08/21/2017 1325 Gross per 24 hour  Intake 702 ml  Output 1885 ml  Net -1183 ml   Filed Weights   08/18/17 1740  Weight: 53.6 kg (118 lb 2.7 oz)    Exam:  Constitutional: Thin male, resp distress  nose- Bath 4 to 5 L/min Eyes: EOMI, anicteric, normal conjunctivae ENMT: Oropharynx with moist mucous membranes, normal dentition Cardiovascular: RRR no MRGs, with no peripheral edema, right subclavian Port-A-Cath site is clean dry and intact Respiratory: Decreased breath sounds in bases, mild tachypnea,  wheezing, scattered rhonchi noted   abdomen: Soft,non-tender, with no HSM Skin: No rash ulcers, or lesions. Without skin tenting  Neurologic: Grossly no focal neuro deficit. Psychiatric:Appropriate affect, and mood. Mental status AAOx3  Data Reviewed: CBC: Recent Labs  Lab 08/16/17 0500 08/17/17 0414 08/18/17 1242 08/20/17 0332 08/21/17 0458  WBC 20.0* 18.2* 22.5* 21.9* 20.2*  NEUTROABS  --   --  19.9*  --   --   HGB 8.5* 7.9* 9.2* 8.5* 8.7*  HCT 27.2* 25.1* 29.6* 27.0* 27.8*  MCV 106.3* 106.8* 106.9* 106.3* 105.7*  PLT 275 262 291 285 259   Basic Metabolic Panel: Recent Labs  Lab 08/16/17 0500 08/17/17 0414 08/18/17 1242 08/20/17 0332 08/21/17 0458  NA 140 143 143 142  142  K 3.8 3.7 3.7 4.0 3.4*  CL 88* 90* 94* 92* 91*  CO2 43* 41* 38* 40* 39*  GLUCOSE 313* 154* 146* 111* 120*  BUN 23* 24* 29* 42* 39*  CREATININE 1.45* 1.51* 1.70* 1.78* 1.63*  CALCIUM 9.7 9.5 9.5 9.7 9.6   GFR: Estimated Creatinine Clearance: 37.5 mL/min (A) (by C-G formula based on SCr of 1.63 mg/dL (H)). Liver Function Tests: Recent Labs  Lab 08/15/17 0834 08/16/17 0500 08/17/17 0414 08/18/17 1242  AST 19 17 17 29   ALT 17 16 14 18   ALKPHOS 96 98 98 115  BILITOT 0.4 0.4 0.4 0.5  PROT 6.0* 6.5 6.1* 7.0  ALBUMIN 3.4* 3.5 3.2* 3.5   No results for input(s): LIPASE, AMYLASE in the last 168 hours. No results for input(s): AMMONIA in the last 168 hours. Coagulation Profile: No results for input(s): INR, PROTIME in the last 168 hours. Cardiac Enzymes: No results for input(s): CKTOTAL, CKMB, CKMBINDEX, TROPONINI in the last 168 hours. BNP (last 3 results) No results for input(s): PROBNP in the last 8760 hours. HbA1C: No results for input(s): HGBA1C in the last 72 hours. CBG: Recent Labs  Lab 08/20/17 1644 08/20/17 2136 08/21/17 0749 08/21/17 1143 08/21/17  1700  GLUCAP 175* 145* 91 229* 277*   Lipid Profile: No results for input(s): CHOL, HDL, LDLCALC, TRIG, CHOLHDL, LDLDIRECT in the last 72 hours. Thyroid Function Tests: No results for input(s): TSH, T4TOTAL, FREET4, T3FREE, THYROIDAB in the last 72 hours. Anemia Panel: No results for input(s): VITAMINB12, FOLATE, FERRITIN, TIBC, IRON, RETICCTPCT in the last 72 hours. Urine analysis:    Component Value Date/Time   COLORURINE YELLOW 07/16/2017 [place] 07/16/2017 1614   LABSPEC 1.011 07/16/2017 1614   PHURINE 6.0 07/16/2017 1614   GLUCOSEU NEGATIVE 07/16/2017 1614   HGBUR NEGATIVE 07/16/2017 1614   BILIRUBINUR SMALL (A) 07/16/2017 1614   KETONESUR NEGATIVE 07/16/2017 1614   PROTEINUR NEGATIVE 07/16/2017 1614   UROBILINOGEN 0.2 05/29/2011 0122   NITRITE NEGATIVE 07/16/2017 1614    LEUKOCYTESUR NEGATIVE 07/16/2017 1614   Sepsis Labs: @LABRCNTIP (procalcitonin:4,lacticidven:4)  ) Recent Results (from the past 240 hour(s))  MRSA PCR Screening     Status: None   Collection Time: 08/14/17  6:40 AM  Result Value Ref Range Status   MRSA by PCR NEGATIVE NEGATIVE Final    Comment:        The GeneXpert MRSA Assay (FDA approved for NASAL specimens only), is one component of a comprehensive MRSA colonization surveillance program. It is not intended to diagnose MRSA infection nor to guide or monitor treatment for MRSA infections. Performed at [facility], [place] [address] [place], [place] [address]       Studies: No results found.  Scheduled Meds: . amLODipine  2.5 mg Oral Daily  . apixaban  5 mg Oral BID  . chlorpheniramine-HYDROcodone  5 mL Oral Q12H  . escitalopram  5 mg Oral QHS  . feeding supplement (ENSURE ENLIVE)  237 mL Oral TID BM  . feeding supplement (PRO-STAT SUGAR FREE 64)  30 mL Oral Daily  . ferrous sulfate  325 mg Oral BID WC  . folic acid  1 mg Oral Daily  . furosemide  40 mg Oral BID  . gabapentin  100 mg Oral TID  . guaiFENesin  1,200 mg Oral BID  . insulin aspart  0-9 Units Subcutaneous TID WC  . ipratropium-albuterol  3 mL Nebulization Q4H  . levofloxacin  750 mg Oral Q48H  . metoCLOPramide  5 mg Oral TID  . metoprolol tartrate  50 mg Oral BID  . pantoprazole  40 mg Oral BID  . polyethylene glycol  17 g Oral Daily  . potassium chloride  20 mEq Oral Daily  . predniSONE  40 mg Oral Q breakfast  . senna-docusate  2 tablet Oral BID    Continuous Infusions:   LOS: 3 days   Roxan Hockey, MD [facility]  If 7PM-7AM, please contact night-coverage [email or web address] Password [facility] 08/21/2017, 6:52 PM

## 2017-08-22 LAB — CBC
HCT: 26.1 % — ABNORMAL LOW (ref 39.0–52.0)
HEMOGLOBIN: 8.2 g/dL — AB (ref 13.0–17.0)
MCH: 33.2 pg (ref 26.0–34.0)
MCHC: 31.4 g/dL (ref 30.0–36.0)
MCV: 105.7 fL — ABNORMAL HIGH (ref 78.0–100.0)
PLATELETS: 243 10*3/uL (ref 150–400)
RBC: 2.47 MIL/uL — AB (ref 4.22–5.81)
RDW: 18.1 % — ABNORMAL HIGH (ref 11.5–15.5)
WBC: 18.2 10*3/uL — AB (ref 4.0–10.5)

## 2017-08-22 LAB — BASIC METABOLIC PANEL
Anion gap: 7 (ref 5–15)
BUN: 39 mg/dL — ABNORMAL HIGH (ref 6–20)
CALCIUM: 9.6 mg/dL (ref 8.9–10.3)
CHLORIDE: 92 mmol/L — AB (ref 98–111)
CO2: 43 mmol/L — AB (ref 22–32)
CREATININE: 1.76 mg/dL — AB (ref 0.61–1.24)
GFR calc non Af Amer: 41 mL/min — ABNORMAL LOW (ref >60)
GFR, EST AFRICAN AMERICAN: 47 mL/min — AB (ref >60)
Glucose, Bld: 117 mg/dL — ABNORMAL HIGH (ref 70–99)
Potassium: 3.8 mmol/L (ref 3.5–5.1)
SODIUM: 142 mmol/L (ref 135–145)

## 2017-08-22 LAB — GLUCOSE, CAPILLARY
GLUCOSE-CAPILLARY: 151 mg/dL — AB (ref 70–99)
GLUCOSE-CAPILLARY: 206 mg/dL — AB (ref 70–99)

## 2017-08-22 MED ORDER — DEXTROMETHORPHAN POLISTIREX ER 30 MG/5ML PO SUER: 30.0000 mg | Freq: Two times a day (BID) | ORAL | 0 refills | Status: DC | PRN

## 2017-08-22 MED ORDER — OXYCODONE HCL 10 MG PO TABS: 10.0000 mg | ORAL_TABLET | ORAL | 0 refills | Status: AC | PRN

## 2017-08-22 MED ORDER — GUAIFENESIN ER 600 MG PO TB12: 1200.0000 mg | ORAL_TABLET | Freq: Two times a day (BID) | ORAL | 0 refills | Status: AC

## 2017-08-22 MED ORDER — INSULIN ASPART 100 UNIT/ML ~~LOC~~ SOLN: SUBCUTANEOUS | 11 refills | Status: AC

## 2017-08-22 MED ORDER — MIRTAZAPINE 15 MG PO TABS: 15.0000 mg | ORAL_TABLET | Freq: Every day | ORAL | 2 refills | Status: AC

## 2017-08-22 MED ORDER — METOPROLOL TARTRATE 50 MG PO TABS: 50.0000 mg | ORAL_TABLET | Freq: Two times a day (BID) | ORAL | 5 refills | Status: AC

## 2017-08-22 MED ORDER — ALBUTEROL SULFATE (2.5 MG/3ML) 0.083% IN NEBU: 2.5000 mg | INHALATION_SOLUTION | RESPIRATORY_TRACT | 12 refills | Status: DC | PRN

## 2017-08-22 MED ORDER — HEPARIN SOD (PORK) LOCK FLUSH 100 UNIT/ML IV SOLN
500.0000 [IU] | INTRAVENOUS | Status: AC | PRN
  Administered 2017-08-22: 500 [IU]

## 2017-08-22 MED ORDER — CLONAZEPAM 0.5 MG PO TABS: 0.2500 mg | ORAL_TABLET | Freq: Two times a day (BID) | ORAL | 0 refills | Status: AC | PRN

## 2017-08-22 MED ORDER — PREDNISONE 20 MG PO TABS: 20.0000 mg | ORAL_TABLET | Freq: Every day | ORAL | 0 refills | Status: AC

## 2017-08-22 MED ORDER — HYDROCOD POLST-CPM POLST ER 10-8 MG/5ML PO SUER: 5.0000 mL | Freq: Two times a day (BID) | ORAL | 0 refills | Status: AC | PRN

## 2017-08-22 MED ORDER — AMOXICILLIN-POT CLAVULANATE 500-125 MG PO TABS: 1.0000 | ORAL_TABLET | Freq: Three times a day (TID) | ORAL | 0 refills | Status: AC

## 2017-08-22 MED ORDER — HYDROXYZINE HCL 10 MG PO TABS: 10.0000 mg | ORAL_TABLET | Freq: Three times a day (TID) | ORAL | 0 refills | Status: AC | PRN

## 2017-08-22 NOTE — Progress Notes (Addendum)
Patient to return to [name].  LCSW confirmed with Peter Congo at facility.   LCSW faxed dc docs to facility.   Patient will transport by [facility]. Unit secretary will call [facility] when patient is ready.   RN report #: [place], Shawna Clamp [place] [phone]

## 2017-08-22 NOTE — Progress Notes (Signed)
Patient also active w/[facility] home 02.

## 2017-08-22 NOTE — Discharge Instructions (Signed)
1)Continue Oxygen via Nasal cannula at 2 to 3 L at rest and up to 3 L with activities 2) avoid smoking while using home oxygen to avoid fires 3) follow-up Dr. Earlie Server the oncologist next week for reevaluation 4) take all medications as advised
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
## 2017-08-22 NOTE — Care Management Note (Signed)
Case Management Note  Patient Details  Name: [name] MRN: [id number] Date of Birth: 1960-02-21  Subjective/Objective: Patient d/c back to ALF w/PCS, &  w/HHC-[facility] already active-rep Santiago Glad aware of d/c, & HHC. Noted [facility] will contact [facility] rep Sharyn Lull to discuss who is still open with services. No further CM needs.                    Action/Plan:d/c ALF w/PCS/HHC.   Expected Discharge Date:  08/22/17               Expected Discharge Plan:  Assisted Living / Rest Home  In-House Referral:     Discharge planning Services  CM Consult  Post Acute Care Choice:  Home Health(Active w/[facility]-HHRN/PT) Choice offered to:     DME Arranged:    DME Agency:     HH Arranged:  RN, Social Work [employer] Agency:  [place]  Status of Service:  Completed, signed off  If discussed at [employer], dates discussed:    Additional Comments:  Dessa Phi, RN 08/22/2017, 3:28 PM

## 2017-08-22 NOTE — Progress Notes (Signed)
Report called to [name].  Patient transported via EMS.

## 2017-08-23 ENCOUNTER — Emergency Department (HOSPITAL_COMMUNITY)
Admission: EM | Admit: 2017-08-23 | Discharge: 2017-08-23 | Disposition: A | Discharge status: Skilled Nursing Facility | Payer: Medicaid Other | Attending: Emergency Medicine | Admitting: Emergency Medicine

## 2017-08-23 ENCOUNTER — Encounter (HOSPITAL_COMMUNITY): Payer: Self-pay | Admitting: Emergency Medicine

## 2017-08-23 ENCOUNTER — Emergency Department (HOSPITAL_COMMUNITY): Payer: Medicaid Other

## 2017-08-23 ENCOUNTER — Emergency Department (HOSPITAL_COMMUNITY)
Admission: EM | Admit: 2017-08-23 | Discharge: 2017-08-24 | Disposition: A | Discharge status: Skilled Nursing Facility | Payer: Medicaid Other | Source: Home / Self Care | Attending: Emergency Medicine | Admitting: Emergency Medicine

## 2017-08-23 DIAGNOSIS — B029 Zoster without complications: Secondary | ICD-10-CM | POA: Diagnosis not present

## 2017-08-23 DIAGNOSIS — F17210 Nicotine dependence, cigarettes, uncomplicated: Secondary | ICD-10-CM | POA: Diagnosis not present

## 2017-08-23 DIAGNOSIS — R0602 Shortness of breath: Secondary | ICD-10-CM | POA: Diagnosis not present

## 2017-08-23 DIAGNOSIS — Z86711 Personal history of pulmonary embolism: Secondary | ICD-10-CM | POA: Diagnosis not present

## 2017-08-23 DIAGNOSIS — I10 Essential (primary) hypertension: Secondary | ICD-10-CM | POA: Diagnosis not present

## 2017-08-23 DIAGNOSIS — R0789 Other chest pain: Secondary | ICD-10-CM | POA: Insufficient documentation

## 2017-08-23 DIAGNOSIS — G8929 Other chronic pain: Secondary | ICD-10-CM

## 2017-08-23 DIAGNOSIS — R0781 Pleurodynia: Secondary | ICD-10-CM | POA: Diagnosis present

## 2017-08-23 DIAGNOSIS — Z79899 Other long term (current) drug therapy: Secondary | ICD-10-CM | POA: Insufficient documentation

## 2017-08-23 DIAGNOSIS — J449 Chronic obstructive pulmonary disease, unspecified: Secondary | ICD-10-CM | POA: Insufficient documentation

## 2017-08-23 DIAGNOSIS — R079 Chest pain, unspecified: Secondary | ICD-10-CM

## 2017-08-23 DIAGNOSIS — Z794 Long term (current) use of insulin: Secondary | ICD-10-CM | POA: Insufficient documentation

## 2017-08-23 DIAGNOSIS — E119 Type 2 diabetes mellitus without complications: Secondary | ICD-10-CM | POA: Insufficient documentation

## 2017-08-23 DIAGNOSIS — Z85118 Personal history of other malignant neoplasm of bronchus and lung: Secondary | ICD-10-CM | POA: Insufficient documentation

## 2017-08-23 DIAGNOSIS — B028 Zoster with other complications: Secondary | ICD-10-CM

## 2017-08-23 LAB — CBC WITH DIFFERENTIAL/PLATELET
Basophils Absolute: 0 10*3/uL (ref 0.0–0.1)
Basophils Relative: 0 %
EOS ABS: 0 10*3/uL (ref 0.0–0.7)
EOS PCT: 0 %
HCT: 30.3 % — ABNORMAL LOW (ref 39.0–52.0)
Hemoglobin: 9.6 g/dL — ABNORMAL LOW (ref 13.0–17.0)
LYMPHS ABS: 0.9 10*3/uL (ref 0.7–4.0)
Lymphocytes Relative: 5 %
MCH: 33 pg (ref 26.0–34.0)
MCHC: 31.7 g/dL (ref 30.0–36.0)
MCV: 104.1 fL — ABNORMAL HIGH (ref 78.0–100.0)
MONOS PCT: 6 %
Monocytes Absolute: 1.2 10*3/uL (ref 0.1–1.0)
Neutro Abs: 16.7 10*3/uL (ref 1.7–7.7)
Neutrophils Relative %: 89 %
PLATELETS: 269 10*3/uL (ref 150–400)
RBC: 2.91 MIL/uL — ABNORMAL LOW (ref 4.22–5.81)
RDW: 18.2 % — ABNORMAL HIGH (ref 11.5–15.5)
WBC: 18.9 10*3/uL — ABNORMAL HIGH (ref 4.0–10.5)

## 2017-08-23 LAB — COMPREHENSIVE METABOLIC PANEL
ALT: 21 U/L (ref 0–44)
AST: 27 U/L (ref 15–41)
Albumin: 3.5 g/dL (ref 3.5–5.0)
Alkaline Phosphatase: 113 U/L (ref 38–126)
Anion gap: 13 (ref 5–15)
BUN: 42 mg/dL — ABNORMAL HIGH (ref 6–20)
CALCIUM: 9.7 mg/dL (ref 8.9–10.3)
CHLORIDE: 92 mmol/L — AB (ref 98–111)
CO2: 39 mmol/L — ABNORMAL HIGH (ref 22–32)
CREATININE: 1.55 mg/dL — AB (ref 0.61–1.24)
GFR calc Af Amer: 55 mL/min — ABNORMAL LOW (ref >60)
GFR, EST NON AFRICAN AMERICAN: 48 mL/min — AB (ref >60)
Glucose, Bld: 111 mg/dL — ABNORMAL HIGH (ref 70–99)
Potassium: 3.9 mmol/L (ref 3.5–5.1)
Sodium: 144 mmol/L (ref 135–145)
Total Bilirubin: 0.4 mg/dL (ref 0.3–1.2)
Total Protein: 6.8 g/dL (ref 6.5–8.1)

## 2017-08-23 LAB — CBG MONITORING, ED: Glucose-Capillary: 79 mg/dL (ref 70–99)

## 2017-08-23 LAB — D-DIMER, QUANTITATIVE: D-Dimer, Quant: 0.55 ug/mL-FEU — ABNORMAL HIGH (ref 0.00–0.50)

## 2017-08-23 MED ORDER — IOPAMIDOL (ISOVUE-300) INJECTION 61%
100.0000 mL | Freq: Once | INTRAVENOUS | Status: AC | PRN
  Administered 2017-08-23: 100 mL via INTRAVENOUS

## 2017-08-23 MED ORDER — IOPAMIDOL (ISOVUE-300) INJECTION 61%
INTRAVENOUS | Status: AC
  Filled 2017-08-23: qty 100

## 2017-08-23 MED ORDER — HYDROMORPHONE HCL 4 MG PO TABS: ORAL_TABLET | ORAL | 0 refills | Status: AC

## 2017-08-23 MED ORDER — HYDROMORPHONE HCL 1 MG/ML IJ SOLN: 1.0000 mg | Freq: Once | INTRAMUSCULAR | Status: DC

## 2017-08-23 MED ORDER — HYDROMORPHONE HCL 1 MG/ML IJ SOLN
1.0000 mg | Freq: Once | INTRAMUSCULAR | Status: AC
  Administered 2017-08-23: 1 mg via INTRAVENOUS
  Filled 2017-08-23: qty 1

## 2017-08-23 MED ORDER — HYDROMORPHONE HCL 1 MG/ML IJ SOLN
0.5000 mg | Freq: Once | INTRAMUSCULAR | Status: AC
  Administered 2017-08-23: 0.5 mg via INTRAMUSCULAR
  Filled 2017-08-23: qty 1

## 2017-08-23 MED ORDER — ONDANSETRON HCL 4 MG/2ML IJ SOLN
4.0000 mg | Freq: Once | INTRAMUSCULAR | Status: AC
  Administered 2017-08-23: 4 mg via INTRAVENOUS
  Filled 2017-08-23: qty 2

## 2017-08-23 NOTE — ED Notes (Signed)
Patient is A & O x 4.  He understood discharge AVS.

## 2017-08-23 NOTE — Discharge Instructions (Addendum)
Follow-up with your doctor as planned 

## 2017-08-23 NOTE — ED Notes (Signed)
Bed: NG39 Expected date: 08/23/17 Expected time: 7:34 AM Means of arrival: Ambulance Comments:     [facility] pt

## 2017-08-23 NOTE — ED Triage Notes (Addendum)
Per EMS ,pt. From [place] with complaint of SOB that was not relief since being discharged here at 2pm this afternoon. Pt. Was seen here earlier for the same problem. Pt. Has lung CA. Pt. Complained of right rib pain at 8/10. Received breathing treatment at facility Pt. Not in respi distress upon arrival to ED. Pt. Has DNR.

## 2017-08-23 NOTE — ED Notes (Signed)
Bed: NG39 Expected date:  Expected time:  Means of arrival:  Comments: 57 yo M/Shortness of breath CA pt

## 2017-08-23 NOTE — ED Triage Notes (Addendum)
Patient here from [facility]. Reports SOB increased on exertion. Hx of lung cancer with mets. Given 5mg  Albuterol in route.

## 2017-08-23 NOTE — ED Provider Notes (Signed)
[place] DEPT Provider Note   CSN: [id number] Arrival date & time: 08/23/17  0736     History   Chief Complaint Chief Complaint  Patient presents with  . Shortness of Breath    HPI [name] is a 57 y.o. male.  Patient has a history of pneumonia pleural effusion PE.  He was just discharged yesterday and complains of right thigh pain with breathing and movement.  He has had this pain before  The history is provided by the patient. No language interpreter was used.  Chest Pain   This is a recurrent problem. The current episode started 12 to 24 hours ago. The problem occurs constantly. The problem has not changed since onset.The pain is associated with movement and breathing. The pain is present in the lateral region. The pain is moderate. The quality of the pain is described as sharp. The pain does not radiate. Associated symptoms include shortness of breath. Pertinent negatives include no abdominal pain, no back pain, no cough and no headaches.  Pertinent negatives for past medical history include no seizures.    Past Medical History:  Diagnosis Date  . Abdominal pain 06/04/2016  . Adenocarcinoma of left lung, stage 4 ([place]) 05/02/2016  . Alcohol abuse   . Bronchitis due to tobacco use ([place])   . Cancer ([place])    Lung  . COPD (chronic obstructive pulmonary disease) ([place])    on home o2 2LNC   . Dehydration 06/04/2016  . Encounter for antineoplastic chemotherapy 05/02/2016  . Gastritis   . Goals of care, counseling/discussion 05/02/2016  . Hematemesis   . HTN (hypertension) 10/30/2016  . Pulmonary embolism ([place]) 06/30/2017   on CTA chest  . Recurrent upper respiratory infection (URI)   . Seizures ([place]) 05/2011   new onset    Patient Active Problem List   Diagnosis Date Noted  . Steroid-induced hyperglycemia 08/19/2017  . Acute on chronic respiratory failure with hypoxia and hypercapnia ([place]) 08/13/2017  . Protein-calorie malnutrition,  severe 07/31/2017  . Thrombocytopenia ([place]) 07/29/2017  . Wheezing on exhalation 07/29/2017  . Anemia 07/24/2017  . Normocytic anemia 07/24/2017  . Chronic diastolic (congestive) heart failure ([place]) 07/24/2017  . Recurrent pleural effusion on right 07/15/2017  . Acute on chronic respiratory failure ([place]) 07/11/2017  . History of pulmonary embolism 07/11/2017  . PE (pulmonary thromboembolism) ([place]) 06/30/2017  . Acute kidney injury superimposed on CKD ([place]) 06/25/2017  . Dyspnea 06/21/2017  . Chronic combined systolic (congestive) and diastolic (congestive) heart failure ([place])   . Constipation 06/07/2017  . Pain   . Acute on chronic respiratory failure with hypoxia ([place]) 05/28/2017  . Chronic respiratory failure with hypoxia ([place]) 04/01/2017  . Leucocytosis 03/16/2017  . CKD (chronic kidney disease), stage III ([place]) 03/12/2017  . Malnutrition of moderate degree 03/10/2017  . COPD exacerbation ([place]) 03/09/2017  . Macrocytic anemia 02/07/2017  . Severe malnutrition ([place]) 01/03/2017  . DOE (dyspnea on exertion) 12/30/2016  . Nausea   . Hypervolemia   . HCAP (healthcare-associated pneumonia) 12/19/2016  . SOB (shortness of breath) 12/19/2016  . Erosive gastropathy 12/18/2016  . Gastritis 12/18/2016  . Hematemesis 12/16/2016  . Intractable nausea and vomiting 12/16/2016  . HTN (hypertension) 10/30/2016  . Chronic obstructive pulmonary disease ([place]) 09/02/2016  . COPD (chronic obstructive pulmonary disease) ([place]) 08/19/2016  . Dehydration 06/04/2016  . Abdominal pain 06/04/2016  . 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 Adenocarcinoma of left lung, stage 4 ([place]) 05/02/2016  . Goals of care,  counseling/discussion 05/02/2016  . Encounter for antineoplastic chemotherapy 05/02/2016  . Tobacco abuse 05/30/2011  . Alcohol abuse 05/30/2011  . Seizure ([place]) 05/28/2011    Past Surgical History:  Procedure Laterality Date  . ESOPHAGOGASTRODUODENOSCOPY (EGD) WITH PROPOFOL N/A 12/17/2016    Procedure: ESOPHAGOGASTRODUODENOSCOPY (EGD) WITH PROPOFOL;  Surgeon: Ladene Artist, MD;  Location: [facility] ENDOSCOPY;  Service: Endoscopy;  Laterality: N/A;  . IR FLUORO GUIDE PORT INSERTION RIGHT  05/13/2016  . IR US GUIDE VASC ACCESS RIGHT  05/13/2016  . NO PAST SURGERIES    . THORACENTESIS  07/05/2017   ultrasound guided        Home Medications    Prior to Admission medications   Medication Sig Start Date End Date Taking? Authorizing Provider  albuterol (PROVENTIL) (2.5 MG/3ML) 0.083% nebulizer solution 1 neb every 4-6 hours as needed for wheezing and shortness of breath 04/01/17  Yes Parrett, Tammy S, NP  Amino Acids-Protein Hydrolys (FEEDING SUPPLEMENT, PRO-STAT SUGAR FREE 64,) LIQD Take 30 mLs by mouth daily. 08/17/17  Yes Jani Gravel, MD  amLODipine (NORVASC) 2.5 MG tablet Take 2.5 mg by mouth daily.   Yes [provider]  apixaban (ELIQUIS) 5 MG TABS tablet Take 5 mg by mouth 2 (two) times daily.   Yes [provider]  chlorpheniramine-HYDROcodone (TUSSIONEX PENNKINETIC ER) 10-8 MG/5ML SUER Take 5 mLs by mouth every 12 (twelve) hours as needed for cough. 08/22/17  Yes Emokpae, Courage, MD  feeding supplement, ENSURE ENLIVE, (ENSURE ENLIVE) LIQD Take 237 mLs by mouth 3 (three) times daily between meals. 08/17/17  Yes Jani Gravel, MD  ferrous sulfate 325 (65 FE) MG tablet Take 1 tablet (325 mg total) by mouth 2 (two) times daily with a meal. 07/12/17  Yes Lavina Hamman, MD  Fluticasone-Umeclidin-Vilant (TRELEGY ELLIPTA) 100-62.5-25 MCG/INH AEPB Inhale 1 puff into the lungs daily. 05/31/17  Yes Aline August, MD  folic acid (FOLVITE) 1 MG tablet Take 1 tablet (1 mg total) by mouth daily. 07/13/17  Yes Lavina Hamman, MD  furosemide (LASIX) 40 MG tablet Take 40 mg by mouth 2 (two) times daily.   Yes [provider]  gabapentin (NEURONTIN) 100 MG capsule Take 1 capsule (100 mg total) by mouth 3 (three) times daily. 07/12/17  Yes Lavina Hamman, MD  guaiFENesin (MUCINEX)  600 MG 12 hr tablet Take 2 tablets (1,200 mg total) by mouth 2 (two) times daily. 08/22/17  Yes Roxan Hockey, MD  hydrOXYzine (ATARAX/VISTARIL) 10 MG tablet Take 1 tablet (10 mg total) by mouth 3 (three) times daily as needed for anxiety. 08/22/17  Yes Roxan Hockey, MD  insulin aspart (NOVOLOG) 100 UNIT/ML injection 0-5 Units, Subcutaneous, Daily at bedtime, First dose on Fri 08/15/17 at 2200 Correction coverage: HS scale CBG < 70: implement hypoglycemia protocol CBG 70 - 120: 0 units CBG 121 - 150: 0 units CBG 151 - 200: 0 units CBG 201 - 250: 2 units CBG 251 - 300: 3 units CBG 301 - 350: 4 units CBG 351 - 400: 5 units CBG > 400: call MD and obtain STAT lab verification 08/22/17  Yes Emokpae, Courage, MD  insulin aspart (NOVOLOG) 100 UNIT/ML injection Please inject  0-9 Units, Subcutaneous, 3 times daily with meals, First dose on Fri 08/15/17 at 1700 Correction coverage: Sensitive (thin, NPO, renal) CBG < 70: implement hypoglycemia protocol CBG 70 - 120: 0 units CBG 121 - 150: 1 unit CBG 151 - 200: 2 units CBG 201 - 250: 3 units CBG 251 - 300: 5  units CBG 301 - 350: 7 units CBG 351 - 400: 9 units CBG > 400: call MD and obtain STAT lab verification 08/22/17  Yes Emokpae, Courage, MD  loratadine (CLARITIN) 10 MG tablet Take 10 mg by mouth daily.   Yes [provider]  metoCLOPramide (REGLAN) 5 MG tablet Take 1 tablet (5 mg total) by mouth 3 (three) times daily. 08/17/17 08/17/18 Yes Jani Gravel, MD  metoprolol tartrate (LOPRESSOR) 50 MG tablet Take 1 tablet (50 mg total) by mouth 2 (two) times daily. 08/22/17  Yes Emokpae, Courage, MD  Multiple Vitamin (THERA-TABS PO) Take 1 tablet by mouth daily.   Yes [provider]  oxyCODONE-acetaminophen (PERCOCET/ROXICET) 5-325 MG tablet Take 1 tablet by mouth every 4 (four) hours as needed for severe pain.   Yes [provider]  pantoprazole (PROTONIX) 40 MG tablet Take 1 tablet (40 mg total) by mouth 2 (two) times daily.  12/18/16  Yes Eugenie Filler, MD  polyethylene glycol [facility] / Floria Raveling) packet Take 17 g by mouth daily. 07/29/17  Yes Lavina Hamman, MD  potassium chloride (K-DUR) 10 MEQ tablet Take 2 tablets (20 mEq total) by mouth daily. 07/28/17  Yes Lavina Hamman, MD  predniSONE (DELTASONE) 10 MG tablet Take 40 mg by mouth daily with breakfast.   Yes [provider]  senna-docusate (SENOKOT-S) 8.6-50 MG tablet Take 2 tablets by mouth 2 (two) times daily. 07/12/17  Yes Lavina Hamman, MD  sucralfate (CARAFATE) 1 GM/10ML suspension Take 10 mLs (1 g total) by mouth 4 (four) times daily -  with meals and at bedtime. 07/12/17  Yes Lavina Hamman, MD  albuterol (PROVENTIL) (2.5 MG/3ML) 0.083% nebulizer solution Take 3 mLs (2.5 mg total) by nebulization every 6 (six) hours as needed for wheezing or shortness of breath. Patient not taking: Reported on 08/23/2017 08/17/17 09/16/17  Jani Gravel, MD  albuterol (PROVENTIL) (2.5 MG/3ML) 0.083% nebulizer solution Take 3 mLs (2.5 mg total) by nebulization every 2 (two) hours as needed for wheezing or shortness of breath. Patient not taking: Reported on 08/23/2017 08/22/17   Roxan Hockey, MD  amoxicillin-clavulanate (AUGMENTIN) 500-125 MG tablet Take 1 tablet (500 mg total) by mouth 3 (three) times daily for 7 days. Patient not taking: Reported on 08/23/2017 08/22/17 08/29/17  Roxan Hockey, MD  clonazePAM (KLONOPIN) 0.5 MG tablet Take 0.5 tablets (0.25 mg total) by mouth 2 (two) times daily as needed for anxiety. Patient not taking: Reported on 08/23/2017 08/22/17   Roxan Hockey, MD  dextromethorphan (DELSYM) 30 MG/5ML liquid Take 5 mLs (30 mg total) by mouth 2 (two) times daily as needed for cough. Patient not taking: Reported on 08/23/2017 08/22/17   Roxan Hockey, MD  HYDROmorphone (DILAUDID) 4 MG tablet Take 1 every 6 hours for pain not helped by the oxycodone 08/23/17   Milton Ferguson, MD  mirtazapine (REMERON) 15 MG tablet Take 1 tablet (15 mg total) by mouth at  bedtime. Patient not taking: Reported on 08/23/2017 08/22/17 08/22/18  Roxan Hockey, MD  ondansetron (ZOFRAN) 4 MG tablet Take 1 tablet (4 mg total) by mouth every 6 (six) hours as needed for nausea. Patient not taking: Reported on 08/23/2017 08/17/17   Jani Gravel, MD  Oxycodone HCl 10 MG TABS Take 1 tablet (10 mg total) by mouth every 4 (four) hours as needed (pain). Patient not taking: Reported on 08/23/2017 08/22/17   Roxan Hockey, MD  predniSONE (DELTASONE) 20 MG tablet Take 1 tablet (20 mg total) by mouth daily with breakfast.  Patient not taking: Reported on 08/23/2017 08/23/17   Roxan Hockey, MD    Family History Family History  Problem Relation Age of Onset  . Cancer Father   . Diabetes Mellitus II Mother     Social History Social History   Tobacco Use  . Smoking status: Current Some Day Smoker    Packs/day: 0.10    Years: 30.00    Pack years: 3.00    Types: Cigarettes  . Smokeless tobacco: Never Used  . Tobacco comment: 1 cig a week  Substance Use Topics  . Alcohol use: No    Frequency: Never  . Drug use: No     Allergies   Patient has no known allergies.   Review of Systems Review of Systems  Constitutional: Negative for appetite change and fatigue.  HENT: Negative for congestion, ear discharge and sinus pressure.   Eyes: Negative for discharge.  Respiratory: Positive for shortness of breath. Negative for cough.   Cardiovascular: Positive for chest pain.  Gastrointestinal: Negative for abdominal pain and diarrhea.  Genitourinary: Negative for frequency and hematuria.  Musculoskeletal: Negative for back pain.  Skin: Negative for rash.  Neurological: Negative for seizures and headaches.  Psychiatric/Behavioral: Negative for hallucinations.     Physical Exam Updated Vital Signs BP (!) 137/98 (BP Location: Left Arm)   Pulse 74   Temp 98.5 F (36.9 C) (Oral)   Resp 15   SpO2 100%   Physical Exam  Constitutional: He is oriented to person, place, and  time. He appears well-developed.  HENT:  Head: Normocephalic.  Eyes: Conjunctivae and EOM are normal. No scleral icterus.  Neck: Neck supple. No thyromegaly present.  Cardiovascular: Normal rate and regular rhythm. Exam reveals no gallop and no friction rub.  No murmur heard. Pulmonary/Chest: No stridor. He has no wheezes. He has no rales. He exhibits no tenderness.  Mild wheezing right chest  Abdominal: He exhibits no distension. There is no tenderness. There is no rebound.  Musculoskeletal: Normal range of motion. He exhibits no edema.  Lymphadenopathy:    He has no cervical adenopathy.  Neurological: He is oriented to person, place, and time. He exhibits normal muscle tone. Coordination normal.  Skin: No rash noted. No erythema.  Psychiatric: He has a normal mood and affect. His behavior is normal.     ED Treatments / Results  Labs (all labs ordered are listed, but only abnormal results are displayed) Labs Reviewed  CBC WITH DIFFERENTIAL/PLATELET - Abnormal; Notable for the following components:      Result Value   WBC 18.9 (*)    RBC 2.91 (*)    Hemoglobin 9.6 (*)    HCT 30.3 (*)    MCV 104.1 (*)    RDW 18.2 (*)    All other components within normal limits  COMPREHENSIVE METABOLIC PANEL - Abnormal; Notable for the following components:   Chloride 92 (*)    CO2 39 (*)    Glucose, Bld 111 (*)    BUN 42 (*)    Creatinine, Ser 1.55 (*)    GFR calc non Af Amer 48 (*)    GFR calc Af Amer 55 (*)    All other components within normal limits  D-DIMER, QUANTITATIVE (NOT AT [facility]) - Abnormal; Notable for the following components:   D-Dimer, Quant 0.55 (*)    All other components within normal limits  CBG MONITORING, ED    EKG None  Radiology Dg Chest 2 View  Result Date: 08/23/2017 CLINICAL DATA:  Shortness of breath on exertion, history of lung carcinoma EXAM: CHEST - 2 VIEW COMPARISON:  08/18/2017 FINDINGS: Cardiac shadow is stable. Right chest wall port is again seen  and stable. Left lung again demonstrates a small left pleural effusion. Stellate density is noted in the apex stable in appearance. More marked right pleural effusion is again noted and stable. IMPRESSION: No significant interval change from the previous exam Electronically Signed   By: Inez Catalina M.D.   On: 08/23/2017 08:42   Ct Angio Chest Pe W And/or Wo Contrast  Result Date: 08/23/2017 CLINICAL DATA:  Shortness of breath with exertion. History of metastatic lung cancer. Right flank pain. EXAM: CT ANGIOGRAPHY CHEST WITH CONTRAST TECHNIQUE: Multidetector CT imaging of the chest was performed using the standard protocol during bolus administration of intravenous contrast. Multiplanar CT image reconstructions and MIPs were obtained to evaluate the vascular anatomy. CONTRAST:  163mL ISOVUE-300 IOPAMIDOL (ISOVUE-300) INJECTION 61% COMPARISON:  Radiographs 08/23/2017.  CT 07/29/2017. FINDINGS: Cardiovascular: The pulmonary arteries are well opacified with contrast to the level of the subsegmental branches. There is no evidence of recurrent pulmonary embolism. No significant systemic arterial abnormalities. Right IJ Port-A-Cath extends to the superior right atrium. The heart size is normal. There is no pericardial effusion. Mediastinum/Nodes: There are no enlarged mediastinal, hilar or axillary lymph nodes. Possible mild chronic esophageal wall thickening, unchanged. The trachea and thyroid gland appear unremarkable. Lungs/Pleura: A partially loculated moderate volume right pleural effusion has not significantly changed in size. There is no significant left pleural effusion. There is moderate to severe centrilobular and paraseptal emphysema. Left suprahilar scarring, volume loss and central airway thickening are stable. No recurrent mass lesion is seen. There is stable dependent atelectasis in the right lower lobe. Upper abdomen: The visualized upper abdomen appears stable without suspicious findings.  Musculoskeletal/Chest wall: There is stable diffuse sclerosis of the T12 vertebral body. No new or acute osseous lesions. Review of the MIP images confirms the above findings. IMPRESSION: 1. No evidence of acute pulmonary embolism or other acute chest process. 2. Stable moderate size right pleural effusion and compressive right lower lobe atelectasis. 3. Stable left apical scarring. No evidence of local recurrence or progressive metastatic disease. 4.  Emphysema (ICD10-J43.9). 5. 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   On: 08/23/2017 10:48    Procedures Procedures (including critical care time)  Medications Ordered in ED Medications  iopamidol (ISOVUE-300) 61 % injection (has no administration in time range)  HYDROmorphone (DILAUDID) injection 1 mg (1 mg Intravenous Given 08/23/17 0847)  ondansetron (ZOFRAN) injection 4 mg (4 mg Intravenous Given 08/23/17 0847)  iopamidol (ISOVUE-300) 61 % injection 100 mL (100 mLs Intravenous Contrast Given 08/23/17 1012)     Initial Impression / Assessment and Plan / ED Course  I have reviewed the triage vital signs and the nursing notes.  Pertinent labs & imaging results that were available during my care of the patient were reviewed by me and considered in my medical decision making (see chart for details).   pt with chest pain from lung ca and pleural effusion.  Ct angio shows no pe.  Pt felt better with dilaudid.  He will be discharged home with po dilaudidi    Final Clinical Impressions(s) / ED Diagnoses   Final diagnoses:  Chest pain in adult    ED Discharge Orders        Ordered    HYDROmorphone (DILAUDID) 4 MG tablet  08/23/17 1212       Milton Ferguson, MD 08/23/17 1232

## 2017-08-24 ENCOUNTER — Encounter (HOSPITAL_COMMUNITY): Payer: Self-pay

## 2017-08-24 ENCOUNTER — Emergency Department (HOSPITAL_COMMUNITY): Payer: Medicaid Other

## 2017-08-24 ENCOUNTER — Emergency Department (HOSPITAL_COMMUNITY)
Admission: EM | Admit: 2017-08-24 | Discharge: 2017-08-25 | Disposition: A | Discharge status: Home / Self Care | Payer: Medicaid Other | Attending: Emergency Medicine | Admitting: Emergency Medicine

## 2017-08-24 DIAGNOSIS — F17210 Nicotine dependence, cigarettes, uncomplicated: Secondary | ICD-10-CM | POA: Insufficient documentation

## 2017-08-24 DIAGNOSIS — R0602 Shortness of breath: Secondary | ICD-10-CM

## 2017-08-24 DIAGNOSIS — N183 Chronic kidney disease, stage 3 (moderate): Secondary | ICD-10-CM | POA: Diagnosis not present

## 2017-08-24 DIAGNOSIS — J449 Chronic obstructive pulmonary disease, unspecified: Secondary | ICD-10-CM | POA: Diagnosis not present

## 2017-08-24 DIAGNOSIS — R52 Pain, unspecified: Secondary | ICD-10-CM

## 2017-08-24 DIAGNOSIS — I5032 Chronic diastolic (congestive) heart failure: Secondary | ICD-10-CM | POA: Diagnosis not present

## 2017-08-24 DIAGNOSIS — I130 Hypertensive heart and chronic kidney disease with heart failure and stage 1 through stage 4 chronic kidney disease, or unspecified chronic kidney disease: Secondary | ICD-10-CM | POA: Diagnosis not present

## 2017-08-24 DIAGNOSIS — B029 Zoster without complications: Secondary | ICD-10-CM

## 2017-08-24 LAB — CBC WITH DIFFERENTIAL/PLATELET
Basophils Absolute: 0 10*3/uL (ref 0.0–0.1)
Basophils Relative: 0 %
EOS ABS: 0 10*3/uL (ref 0.0–0.7)
EOS PCT: 0 %
HCT: 29.4 % — ABNORMAL LOW (ref 39.0–52.0)
Hemoglobin: 9.3 g/dL — ABNORMAL LOW (ref 13.0–17.0)
LYMPHS ABS: 0.7 10*3/uL (ref 0.7–4.0)
LYMPHS PCT: 4 %
MCH: 33.5 pg (ref 26.0–34.0)
MCHC: 31.6 g/dL (ref 30.0–36.0)
MCV: 105.8 fL — AB (ref 78.0–100.0)
MONO ABS: 1 10*3/uL (ref 0.1–1.0)
Monocytes Relative: 6 %
Neutro Abs: 15.1 10*3/uL — ABNORMAL HIGH (ref 1.7–7.7)
Neutrophils Relative %: 90 %
PLATELETS: 234 10*3/uL (ref 150–400)
RBC: 2.78 MIL/uL — AB (ref 4.22–5.81)
RDW: 18 % — AB (ref 11.5–15.5)
WBC: 16.9 10*3/uL — AB (ref 4.0–10.5)

## 2017-08-24 LAB — COMPREHENSIVE METABOLIC PANEL
ALK PHOS: 112 U/L (ref 38–126)
ALT: 19 U/L (ref 0–44)
AST: 21 U/L (ref 15–41)
Albumin: 3.5 g/dL (ref 3.5–5.0)
Anion gap: 13 (ref 5–15)
BUN: 33 mg/dL — ABNORMAL HIGH (ref 6–20)
CALCIUM: 9.7 mg/dL (ref 8.9–10.3)
CHLORIDE: 90 mmol/L — AB (ref 98–111)
CO2: 41 mmol/L — AB (ref 22–32)
CREATININE: 1.47 mg/dL — AB (ref 0.61–1.24)
GFR calc non Af Amer: 51 mL/min — ABNORMAL LOW (ref >60)
GFR, EST AFRICAN AMERICAN: 59 mL/min — AB (ref >60)
Glucose, Bld: 112 mg/dL — ABNORMAL HIGH (ref 70–99)
Potassium: 3.9 mmol/L (ref 3.5–5.1)
SODIUM: 144 mmol/L (ref 135–145)
Total Bilirubin: 0.5 mg/dL (ref 0.3–1.2)
Total Protein: 6.5 g/dL (ref 6.5–8.1)

## 2017-08-24 LAB — CBG MONITORING, ED: Glucose-Capillary: 76 mg/dL (ref 70–99)

## 2017-08-24 LAB — LIPASE, BLOOD: Lipase: 24 U/L (ref 11–51)

## 2017-08-24 MED ORDER — VALACYCLOVIR HCL 1 G PO TABS: 1000.0000 mg | ORAL_TABLET | Freq: Three times a day (TID) | ORAL | 0 refills | Status: DC

## 2017-08-24 MED ORDER — HEPARIN SOD (PORK) LOCK FLUSH 100 UNIT/ML IV SOLN
500.0000 [IU] | Freq: Once | INTRAVENOUS | Status: AC
  Administered 2017-08-24: 500 [IU]
  Filled 2017-08-24: qty 5

## 2017-08-24 MED ORDER — VALACYCLOVIR HCL 500 MG PO TABS
1000.0000 mg | ORAL_TABLET | Freq: Three times a day (TID) | ORAL | Status: DC
  Administered 2017-08-24: 1000 mg via ORAL
  Filled 2017-08-24: qty 2

## 2017-08-24 MED ORDER — GI COCKTAIL ~~LOC~~
30.0000 mL | Freq: Once | ORAL | Status: AC
  Administered 2017-08-24: 30 mL via ORAL
  Filled 2017-08-24: qty 30

## 2017-08-24 MED ORDER — SODIUM CHLORIDE 0.9 % IV BOLUS
1000.0000 mL | Freq: Once | INTRAVENOUS | Status: AC
  Administered 2017-08-24: 1000 mL via INTRAVENOUS

## 2017-08-24 MED ORDER — HYDROMORPHONE HCL 1 MG/ML IJ SOLN
1.0000 mg | Freq: Once | INTRAMUSCULAR | Status: AC
  Administered 2017-08-24: 1 mg via INTRAVENOUS
  Filled 2017-08-24: qty 1

## 2017-08-24 MED ORDER — IPRATROPIUM-ALBUTEROL 0.5-2.5 (3) MG/3ML IN SOLN
3.0000 mL | Freq: Once | RESPIRATORY_TRACT | Status: AC
  Administered 2017-08-24: 3 mL via RESPIRATORY_TRACT
  Filled 2017-08-24: qty 3

## 2017-08-24 MED ORDER — HYDROMORPHONE HCL 2 MG PO TABS
2.0000 mg | ORAL_TABLET | Freq: Once | ORAL | Status: AC
  Administered 2017-08-24: 2 mg via ORAL
  Filled 2017-08-24: qty 1

## 2017-08-24 NOTE — Discharge Instructions (Signed)
You were seen in the ED today with pain. This is likely pain from both your shingles rash as well as associated cancer pain. You will need to coordinate with your PCP, Oncologist, and possibly palliate care team for additional pain control.

## 2017-08-24 NOTE — ED Notes (Signed)
[facility] called for transport.  

## 2017-08-24 NOTE — Discharge Instructions (Signed)
Continue your home prescribed medications.  You were found to have shingles on your exam today.  Take Valtrex as prescribed until finished.  The pain associated with your rash should improve as symptoms resolve.  Follow-up with your primary care doctor as well as your oncologist regarding your visit today.

## 2017-08-24 NOTE — ED Notes (Signed)
Bed: VJ28 Expected date:  Expected time:  Means of arrival:  Comments: 57 yo M/Generalized pain

## 2017-08-24 NOTE — ED Triage Notes (Signed)
Pt arrived via [facility] due to generalized pain related to shingles diagnosed yesterday. Denies medication from facility to be seen here. Shingles located on right back.

## 2017-08-24 NOTE — ED Notes (Signed)
Pt given [place] sandwich, cheese stick and sprite

## 2017-08-24 NOTE — ED Provider Notes (Signed)
[facility] DEPT Provider Note   CSN: [id number] Arrival date & time: 08/23/17  2316     History   Chief Complaint Chief Complaint  Patient presents with  . Shortness of Breath    HPI [name] is a 57 y.o. male.   57 y/o male with medical history of stage 4 lung adenocarcinoma, COPD on 2-3L chronic O2, PE (on Eliquis), HTN, CKD3 presents to the ED for c/o chest pain. He was seen earlier yesterday for similar complaints. Also discharged from the hospital 2 days ago after admission for ARF. States pain to right ribs have remained constant, rated at 8/10. It was documented to have improved with Dilaudid in the ED, but patient was unable to get this Rx filled to take any further medication at home. He does report a rash to the area. He has not had any fevers. Received breathing tx at this facility prior to transport.   The history is provided by the patient. No language interpreter was used.  Shortness of Breath     Past Medical History:  Diagnosis Date  . Abdominal pain 06/04/2016  . Adenocarcinoma of left lung, stage 4 ([place]) 05/02/2016  . Alcohol abuse   . Bronchitis due to tobacco use ([place])   . Cancer ([place])    Lung  . COPD (chronic obstructive pulmonary disease) ([place])    on home o2 2LNC   . Dehydration 06/04/2016  . Encounter for antineoplastic chemotherapy 05/02/2016  . Gastritis   . Goals of care, counseling/discussion 05/02/2016  . Hematemesis   . HTN (hypertension) 10/30/2016  . Pulmonary embolism ([place]) 06/30/2017   on CTA chest  . Recurrent upper respiratory infection (URI)   . Seizures ([place]) 05/2011   new onset    Patient Active Problem List   Diagnosis Date Noted  . Steroid-induced hyperglycemia 08/19/2017  . Acute on chronic respiratory failure with hypoxia and hypercapnia ([place]) 08/13/2017  . Protein-calorie malnutrition, severe 07/31/2017  . Thrombocytopenia ([place]) 07/29/2017  . Wheezing on exhalation 07/29/2017  . Anemia  07/24/2017  . Normocytic anemia 07/24/2017  . Chronic diastolic (congestive) heart failure ([place]) 07/24/2017  . Recurrent pleural effusion on right 07/15/2017  . Acute on chronic respiratory failure ([place]) 07/11/2017  . History of pulmonary embolism 07/11/2017  . PE (pulmonary thromboembolism) ([place]) 06/30/2017  . Acute kidney injury superimposed on CKD ([place]) 06/25/2017  . Dyspnea 06/21/2017  . Chronic combined systolic (congestive) and diastolic (congestive) heart failure ([place])   . Constipation 06/07/2017  . Pain   . Acute on chronic respiratory failure with hypoxia ([place]) 05/28/2017  . Chronic respiratory failure with hypoxia ([place]) 04/01/2017  . Leucocytosis 03/16/2017  . CKD (chronic kidney disease), stage III ([place]) 03/12/2017  . Malnutrition of moderate degree 03/10/2017  . COPD exacerbation ([place]) 03/09/2017  . Macrocytic anemia 02/07/2017  . Severe malnutrition ([place]) 01/03/2017  . DOE (dyspnea on exertion) 12/30/2016  . Nausea   . Hypervolemia   . HCAP (healthcare-associated pneumonia) 12/19/2016  . SOB (shortness of breath) 12/19/2016  . Erosive gastropathy 12/18/2016  . Gastritis 12/18/2016  . Hematemesis 12/16/2016  . Intractable nausea and vomiting 12/16/2016  . HTN (hypertension) 10/30/2016  . Chronic obstructive pulmonary disease ([place]) 09/02/2016  . COPD (chronic obstructive pulmonary disease) ([place]) 08/19/2016  . Dehydration 06/04/2016  . Abdominal pain 06/04/2016  . 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 Adenocarcinoma of left lung, stage 4 ([place]) 05/02/2016  . Goals of care, counseling/discussion 05/02/2016  . Encounter for antineoplastic chemotherapy  05/02/2016  . Tobacco abuse 05/30/2011  . Alcohol abuse 05/30/2011  . Seizure ([place]) 05/28/2011    Past Surgical History:  Procedure Laterality Date  . ESOPHAGOGASTRODUODENOSCOPY (EGD) WITH PROPOFOL N/A 12/17/2016   Procedure: ESOPHAGOGASTRODUODENOSCOPY (EGD) WITH PROPOFOL;  Surgeon: Ladene Artist, MD;  Location: [facility]  ENDOSCOPY;  Service: Endoscopy;  Laterality: N/A;  . IR FLUORO GUIDE PORT INSERTION RIGHT  05/13/2016  . IR US GUIDE VASC ACCESS RIGHT  05/13/2016  . NO PAST SURGERIES    . THORACENTESIS  07/05/2017   ultrasound guided        Home Medications    Prior to Admission medications   Medication Sig Start Date End Date Taking? Authorizing Provider  albuterol (PROVENTIL) (2.5 MG/3ML) 0.083% nebulizer solution 1 neb every 4-6 hours as needed for wheezing and shortness of breath 04/01/17  Yes Parrett, Tammy S, NP  Amino Acids-Protein Hydrolys (FEEDING SUPPLEMENT, PRO-STAT SUGAR FREE 64,) LIQD Take 30 mLs by mouth daily. 08/17/17  Yes Jani Gravel, MD  amLODipine (NORVASC) 2.5 MG tablet Take 2.5 mg by mouth daily.   Yes [provider]  apixaban (ELIQUIS) 5 MG TABS tablet Take 5 mg by mouth 2 (two) times daily.   Yes [provider]  chlorpheniramine-HYDROcodone (TUSSIONEX PENNKINETIC ER) 10-8 MG/5ML SUER Take 5 mLs by mouth every 12 (twelve) hours as needed for cough. 08/22/17  Yes Emokpae, Courage, MD  clonazePAM (KLONOPIN) 0.5 MG tablet Take 0.5 tablets (0.25 mg total) by mouth 2 (two) times daily as needed for anxiety. 08/22/17  Yes Emokpae, Courage, MD  escitalopram (LEXAPRO) 5 MG tablet Take 5 mg by mouth daily.   Yes [provider]  feeding supplement, ENSURE ENLIVE, (ENSURE ENLIVE) LIQD Take 237 mLs by mouth 3 (three) times daily between meals. 08/17/17  Yes Jani Gravel, MD  ferrous sulfate 325 (65 FE) MG tablet Take 1 tablet (325 mg total) by mouth 2 (two) times daily with a meal. 07/12/17  Yes Lavina Hamman, MD  Fluticasone-Umeclidin-Vilant (TRELEGY ELLIPTA) 100-62.5-25 MCG/INH AEPB Inhale 1 puff into the lungs daily. 05/31/17  Yes Aline August, MD  folic acid (FOLVITE) 1 MG tablet Take 1 tablet (1 mg total) by mouth daily. 07/13/17  Yes Lavina Hamman, MD  furosemide (LASIX) 40 MG tablet Take 40 mg by mouth 2 (two) times daily.   Yes [provider]  gabapentin  (NEURONTIN) 100 MG capsule Take 1 capsule (100 mg total) by mouth 3 (three) times daily. 07/12/17  Yes Lavina Hamman, MD  guaiFENesin (MUCINEX) 600 MG 12 hr tablet Take 2 tablets (1,200 mg total) by mouth 2 (two) times daily. 08/22/17  Yes Roxan Hockey, MD  hydrOXYzine (ATARAX/VISTARIL) 10 MG tablet Take 1 tablet (10 mg total) by mouth 3 (three) times daily as needed for anxiety. 08/22/17  Yes Roxan Hockey, MD  insulin aspart (NOVOLOG) 100 UNIT/ML injection 0-5 Units, Subcutaneous, Daily at bedtime, First dose on Fri 08/15/17 at 2200 Correction coverage: HS scale CBG < 70: implement hypoglycemia protocol CBG 70 - 120: 0 units CBG 121 - 150: 0 units CBG 151 - 200: 0 units CBG 201 - 250: 2 units CBG 251 - 300: 3 units CBG 301 - 350: 4 units CBG 351 - 400: 5 units CBG > 400: call MD and obtain STAT lab verification 08/22/17  Yes Emokpae, Courage, MD  insulin aspart (NOVOLOG) 100 UNIT/ML injection Please inject  0-9 Units, Subcutaneous, 3 times daily with meals, First dose on Fri 08/15/17 at 1700 Correction coverage: Sensitive (  thin, NPO, renal) CBG < 70: implement hypoglycemia protocol CBG 70 - 120: 0 units CBG 121 - 150: 1 unit CBG 151 - 200: 2 units CBG 201 - 250: 3 units CBG 251 - 300: 5 units CBG 301 - 350: 7 units CBG 351 - 400: 9 units CBG > 400: call MD and obtain STAT lab verification 08/22/17  Yes Emokpae, Courage, MD  loratadine (CLARITIN) 10 MG tablet Take 10 mg by mouth daily.   Yes [provider]  metoCLOPramide (REGLAN) 5 MG tablet Take 1 tablet (5 mg total) by mouth 3 (three) times daily. 08/17/17 08/17/18 Yes Jani Gravel, MD  metoprolol tartrate (LOPRESSOR) 50 MG tablet Take 1 tablet (50 mg total) by mouth 2 (two) times daily. 08/22/17  Yes Emokpae, Courage, MD  Multiple Vitamin (THERA-TABS PO) Take 1 tablet by mouth daily.   Yes [provider]  ondansetron (ZOFRAN) 4 MG tablet Take 1 tablet (4 mg total) by mouth every 6 (six) hours as needed for nausea.  08/17/17  Yes Jani Gravel, MD  Oxycodone HCl 10 MG TABS Take 1 tablet (10 mg total) by mouth every 4 (four) hours as needed (pain). 08/22/17  Yes Emokpae, Courage, MD  oxyCODONE-acetaminophen (PERCOCET/ROXICET) 5-325 MG tablet Take 1 tablet by mouth every 4 (four) hours as needed for severe pain.   Yes [provider]  pantoprazole (PROTONIX) 40 MG tablet Take 1 tablet (40 mg total) by mouth 2 (two) times daily. 12/18/16  Yes Eugenie Filler, MD  polyethylene glycol [facility] Floria [facility]aveling) packet Take 17 g by mouth daily. 07/29/17  Yes Lavina Hamman, MD  potassium chloride (K-DUR) 10 MEQ tablet Take 2 tablets (20 mEq total) by mouth daily. 07/28/17  Yes Lavina Hamman, MD  predniSONE (DELTASONE) 10 MG tablet Take 40 mg by mouth daily with breakfast.   Yes [provider]  senna-docusate (SENOKOT-S) 8.6-50 MG tablet Take 2 tablets by mouth 2 (two) times daily. 07/12/17  Yes Lavina Hamman, MD  sucralfate (CARAFATE) 1 GM/10ML suspension Take 10 mLs (1 g total) by mouth 4 (four) times daily -  with meals and at bedtime. 07/12/17  Yes Lavina Hamman, MD  albuterol (PROVENTIL) (2.5 MG/3ML) 0.083% nebulizer solution Take 3 mLs (2.5 mg total) by nebulization every 6 (six) hours as needed for wheezing or shortness of breath. Patient not taking: Reported on 08/23/2017 08/17/17 09/16/17  Jani Gravel, MD  albuterol (PROVENTIL) (2.5 MG/3ML) 0.083% nebulizer solution Take 3 mLs (2.5 mg total) by nebulization every 2 (two) hours as needed for wheezing or shortness of breath. Patient not taking: Reported on 08/23/2017 08/22/17   Roxan Hockey, MD  amoxicillin-clavulanate (AUGMENTIN) 500-125 MG tablet Take 1 tablet (500 mg total) by mouth 3 (three) times daily for 7 days. Patient not taking: Reported on 08/23/2017 08/22/17 08/29/17  Roxan Hockey, MD  dextromethorphan (DELSYM) 30 MG/5ML liquid Take 5 mLs (30 mg total) by mouth 2 (two) times daily as needed for cough. Patient not taking: Reported on 08/23/2017  08/22/17   Roxan Hockey, MD  HYDROmorphone (DILAUDID) 4 MG tablet Take 1 every 6 hours for pain not helped by the oxycodone Patient not taking: Reported on 08/24/2017 08/23/17   Milton Ferguson, MD  mirtazapine (REMERON) 15 MG tablet Take 1 tablet (15 mg total) by mouth at bedtime. Patient not taking: Reported on 08/23/2017 08/22/17 08/22/18  Roxan Hockey, MD  predniSONE (DELTASONE) 20 MG tablet Take 1 tablet (20 mg total) by mouth daily with breakfast. Patient not  taking: Reported on 08/23/2017 08/23/17   Roxan Hockey, MD  valACYclovir (VALTREX) 1000 MG tablet Take 1 tablet (1,000 mg total) by mouth 3 (three) times daily. 08/24/17   Johnathan Breach, PA-C    Family History Family History  Problem Relation Age of Onset  . Cancer Father   . Diabetes Mellitus II Mother     Social History Social History   Tobacco Use  . Smoking status: Current Some Day Smoker    Packs/day: 0.10    Years: 30.00    Pack years: 3.00    Types: Cigarettes  . Smokeless tobacco: Never Used  . Tobacco comment: 1 cig a week  Substance Use Topics  . Alcohol use: No    Frequency: Never  . Drug use: No     Allergies   Patient has no known allergies.   Review of Systems Review of Systems  Respiratory: Positive for shortness of breath.   Ten systems reviewed and are negative for acute change, except as noted in the HPI.    Physical Exam Updated Vital Signs BP (!) 154/90   Pulse (!) 101   Temp 98.6 F (37 C) (Oral)   Resp (!) 26   SpO2 98%   Physical Exam  Constitutional: He is oriented to person, place, and time. He appears well-developed and well-nourished. No distress.  Chronically ill-appearing, nontoxic  HENT:  Head: Normocephalic and atraumatic.  Eyes: Conjunctivae and EOM are normal. No scleral icterus.  Neck: Normal range of motion.  Cardiovascular: Regular rhythm and intact distal pulses.  Tachycardia  Pulmonary/Chest: Effort normal. No respiratory distress.  Decreased breath wounds  throughout. Mild wheezing in RML. SpO2 100% on 3L nasal cannula.   Musculoskeletal: Normal range of motion.  Neurological: He is alert and oriented to person, place, and time. He exhibits normal muscle tone. Coordination normal.  GCS 15.  Moving all extremities independently.  Skin: Skin is warm and dry. Rash noted. He is not diaphoretic. No erythema. No pallor.  Tender, vesicular rash on erythematous base noted to the right side extending to the right flank. Does not cross midline. No active drainage.   Psychiatric: He has a normal mood and affect. His behavior is normal.  Nursing note and vitals reviewed.    ED Treatments / Results  Labs (all labs ordered are listed, but only abnormal results are displayed) Labs Reviewed  CBG MONITORING, ED    EKG None  Radiology Dg Chest 2 View  Result Date: 08/23/2017 CLINICAL DATA:  Shortness of breath on exertion, history of lung carcinoma EXAM: CHEST - 2 VIEW COMPARISON:  08/18/2017 FINDINGS: Cardiac shadow is stable. Right chest wall port is again seen and stable. Left lung again demonstrates a small left pleural effusion. Stellate density is noted in the apex stable in appearance. More marked right pleural effusion is again noted and stable. IMPRESSION: No significant interval change from the previous exam Electronically Signed   By: Inez Catalina M.D.   On: 08/23/2017 08:42   Ct Angio Chest Pe W And/or Wo Contrast  Result Date: 08/23/2017 CLINICAL DATA:  Shortness of breath with exertion. History of metastatic lung cancer. Right flank pain. EXAM: CT ANGIOGRAPHY CHEST WITH CONTRAST TECHNIQUE: Multidetector CT imaging of the chest was performed using the standard protocol during bolus administration of intravenous contrast. Multiplanar CT image reconstructions and MIPs were obtained to evaluate the vascular anatomy. CONTRAST:  168mL ISOVUE-300 IOPAMIDOL (ISOVUE-300) INJECTION 61% COMPARISON:  Radiographs 08/23/2017.  CT 07/29/2017. FINDINGS:  Cardiovascular: The pulmonary  arteries are well opacified with contrast to the level of the subsegmental branches. There is no evidence of recurrent pulmonary embolism. No significant systemic arterial abnormalities. Right IJ Port-A-Cath extends to the superior right atrium. The heart size is normal. There is no pericardial effusion. Mediastinum/Nodes: There are no enlarged mediastinal, hilar or axillary lymph nodes. Possible mild chronic esophageal wall thickening, unchanged. The trachea and thyroid gland appear unremarkable. Lungs/Pleura: A partially loculated moderate volume right pleural effusion has not significantly changed in size. There is no significant left pleural effusion. There is moderate to severe centrilobular and paraseptal emphysema. Left suprahilar scarring, volume loss and central airway thickening are stable. No recurrent mass lesion is seen. There is stable dependent atelectasis in the right lower lobe. Upper abdomen: The visualized upper abdomen appears stable without suspicious findings. Musculoskeletal/Chest wall: There is stable diffuse sclerosis of the T12 vertebral body. No new or acute osseous lesions. Review of the MIP images confirms the above findings. IMPRESSION: 1. No evidence of acute pulmonary embolism or other acute chest process. 2. Stable moderate size right pleural effusion and compressive right lower lobe atelectasis. 3. Stable left apical scarring. No evidence of local recurrence or progressive metastatic disease. 4.  Emphysema (ICD10-J43.9). 5. 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   On: 08/23/2017 10:48    Procedures Procedures (including critical care time)  Medications Ordered in ED Medications  valACYclovir (VALTREX) tablet 1,000 mg (1,000 mg Oral Given 08/24/17 0522)  HYDROmorphone (DILAUDID) tablet 2 mg (2 mg Oral Given 08/24/17 0448)  gi cocktail (Maalox,Lidocaine,Donnatal) (30 mLs Oral  Given 08/24/17 0449)     Initial Impression / Assessment and Plan / ED Course  I have reviewed the triage vital signs and the nursing notes.  Pertinent labs & imaging results that were available during my care of the patient were reviewed by me and considered in my medical decision making (see chart for details).     57 year old male with multiple comorbidities and frequent ED visits presents to the emergency department for complaints of chest pain and flank pain.  He was seen for similar complaints yesterday in the emergency department.  CT angiogram at this time showed no evidence of pulmonary embolus.  Imaging findings stable.  The patient has not had any hypoxia on his chronic home oxygen.  He is nontoxic in appearance and has been able to tolerate a sandwich and multiple PO fluids.  The area of his pain does seem to correspond with a rash consistent with shingles.  Will start on valacyclovir for management of this.  He was given a prescription for Dilaudid tablets at discharge yesterday.  I do not believe further pain control through the emergency department is appropriate.  Return precautions discussed and provided. Patient discharged in stable condition with no unaddressed concerns.  Vitals:   08/24/17 0430 08/24/17 0445 08/24/17 0523 08/24/17 0530  BP:  130/83 (!) 151/92 (!) 154/90  Pulse: (!) 111 (!) 106 (!) 101   Resp: 20 16 19  (!) 26  Temp:      TempSrc:      SpO2: 100% 100% 98%     Final Clinical Impressions(s) / ED Diagnoses   Final diagnoses:  Herpes zoster with complication  Other chronic pain    ED Discharge Orders        Ordered    valACYclovir (VALTREX) 1000 MG tablet  3 times daily,   Status:  Discontinued  08/24/17 0608    valACYclovir (VALTREX) 1000 MG tablet  3 times daily     08/24/17 0611       Johnathan Breach, PA-C 18/84/16 [id number]    Delora Fuel, MD 01/60/10 [phone]

## 2017-08-25 ENCOUNTER — Telehealth: Payer: Self-pay | Admitting: Medical Oncology

## 2017-08-25 ENCOUNTER — Telehealth: Payer: Self-pay

## 2017-08-25 DIAGNOSIS — F419 Anxiety disorder, unspecified: Secondary | ICD-10-CM | POA: Insufficient documentation

## 2017-08-25 DIAGNOSIS — C3492 Malignant neoplasm of unspecified part of left bronchus or lung: Secondary | ICD-10-CM

## 2017-08-25 NOTE — Telephone Encounter (Signed)
[name] at [name], called to request Hospice referral and pt is in agreement. Faxed referral to [facility] per [facility] request.

## 2017-08-25 NOTE — Progress Notes (Signed)
PALLIATIVE CARE CONSULT VISIT   PATIENT NAME: [name] DOB: Jun 06, 1960 MRN: [id number]  PRIMARY CARE PROVIDER:   [name], MD  REFERRING PROVIDER:  [name], MD [place], [place] [address]  RESPONSIBLE PARTY:   Self      RECOMMENDATIONS and PLAN:  1.  Shortness of breath R06.02:  Chronic and not well managed.  Related to lung cancer, COPD and anxiety.  Reviewed methods to manage COPD daily with use of inhalers and nebulizer treatments with patient and clinical staff.  Reviewed discharge meds with same. Encouraged pt to try nebulizer treatments when due prior to contacting EMS. Continue use of Klonipine and Lexapro.  Nutritional supplements for meals when patient is more short of breath.  2. Anxiety F41.9:  Chronic and exacerbated by chronic shortness of breath and diagnoses.  Currently on Klonipine and Lexapro.  Consider increasing dose of Lexapro.  Social worker/behavioral health discussion to address psycho social concerns.    3.  Palliative care encounter Z51.5  Discussed goals of care and advanced care planning with pt.  He initially discussed "moving to Hospice care" but he wants to continue to receive chemotherapy.  He does not desire to be maintained on "machines" for the remainder of his life.  He declines Hospice at this time.  Palliative care will continue to follow and re-discuss appropriateness for Hospice Care.  Plan on joint visit from Palliative Social Worker. Primary goal is to reduce hospitalizations.   I spent 60 minutes providing this consultation,  from 1:30pm to 2:30pm at [name].  [name] is a 57 year old male with multiple medical problems including COPD with multiple exacerbations, stage IV lung cancer diagnosed in Feb 2018, pulmonary embolism, anemia, seizure disorder and anxiety.  He completed palliative radiation in May 2018 and has been receiving mantainence chemotherapy every 3 weeks.  He was discharged from  the hospital today and has had numerous ER visits and hospital admissions every month due to various ailments which are normally shortness of breath/COPD exacerbation. He reports weight loss.  Current weight is 115# with a max weight this year in March at 132#.  He reports that he continues to have daily shortness of breath especially upon exertion and he "prefers" to go to the hospital when he has difficulty breathing.  He shows me different inhalers but he does not know the names of his medications that he actually uses to managed COPD.    Palliative Care was asked to help address goals of care.   CODE STATUS:  DNAR/DNI  PPS: 40% HOSPICE ELIGIBILITY/DIAGNOSIS: YES/ End stage COPD with Stage IV lung cancer//Pt. Continues to receive chemotherapy  PAST MEDICAL HISTORY:  Past Medical History:  Diagnosis Date  . Abdominal pain 06/04/2016  . Adenocarcinoma of left lung, stage 4 ([place]) 05/02/2016  . Alcohol abuse   . Bronchitis due to tobacco use ([place])   . Cancer ([place])    Lung  . COPD (chronic obstructive pulmonary disease) ([place])    on home o2 2LNC   . Dehydration 06/04/2016  . Encounter for antineoplastic chemotherapy 05/02/2016  . Gastritis   . Goals of care, counseling/discussion 05/02/2016  . Hematemesis   . HTN (hypertension) 10/30/2016  . Pulmonary embolism ([place]) 06/30/2017   on CTA chest  . Recurrent upper respiratory infection (URI)   . Seizures ([place]) 05/2011   new onset    SOCIAL HX:  Social History   Tobacco Use  .  Smoking status: Current Some Day Smoker    Packs/day: 0.10    Years: 30.00    Pack years: 3.00    Types: Cigarettes  . Smokeless tobacco: Never Used  . Tobacco comment: 1 cig a week  Substance Use Topics  . Alcohol use: No    Frequency: Never    ALLERGIES: No Known Allergies   PERTINENT MEDICATIONS:  Outpatient Encounter Medications as of 08/11/2017  Medication Sig  . albuterol (PROVENTIL) (2.5 MG/3ML) 0.083% nebulizer solution 1 neb every 4-6 hours as  needed for wheezing and shortness of breath  . apixaban (ELIQUIS) 5 MG TABS tablet Take 5 mg by mouth 2 (two) times daily.  . ferrous sulfate 325 (65 FE) MG tablet Take 1 tablet (325 mg total) by mouth 2 (two) times daily with a meal.  . Fluticasone-Umeclidin-Vilant (TRELEGY ELLIPTA) 100-62.5-25 MCG/INH AEPB Inhale 1 puff into the lungs daily.  . folic acid (FOLVITE) 1 MG tablet Take 1 tablet (1 mg total) by mouth daily.  [place] Kitchen gabapentin (NEURONTIN) 100 MG capsule Take 1 capsule (100 mg total) by mouth 3 (three) times daily.  [place] Kitchen loratadine (CLARITIN) 10 MG tablet Take 10 mg by mouth daily.  . Multiple Vitamin (THERA-TABS PO) Take 1 tablet by mouth daily.  . pantoprazole (PROTONIX) 40 MG tablet Take 1 tablet (40 mg total) by mouth 2 (two) times daily.  . polyethylene glycol (MIRALAX / GLYCOLAX) packet Take 17 g by mouth daily.  . potassium chloride (K-DUR) 10 MEQ tablet Take 2 tablets (20 mEq total) by mouth daily.  [place] Kitchen senna-docusate (SENOKOT-S) 8.6-50 MG tablet Take 2 tablets by mouth 2 (two) times daily.  . sucralfate (CARAFATE) 1 GM/10ML suspension Take 10 mLs (1 g total) by mouth 4 (four) times daily -  with meals and at bedtime.  . [DISCONTINUED] acetaminophen (TYLENOL) 500 MG tablet Take 1 tablet (500 mg total) by mouth 2 (two) times daily as needed for mild pain.  . [DISCONTINUED] Chlorphen-Phenyleph-ASA (ALKA-SELTZER PLUS COLD PO) Take 2 tablets by mouth at bedtime as needed (COUGH).  . [DISCONTINUED] Dextromethorphan-guaiFENesin (TUSSIN DM) 10-100 MG/5ML liquid Take 5 mLs by mouth every 12 (twelve) hours.  . [DISCONTINUED] escitalopram (LEXAPRO) 5 MG tablet Take 1 tablet (5 mg total) by mouth at bedtime.  . [DISCONTINUED] feeding supplement, ENSURE ENLIVE, (ENSURE ENLIVE) LIQD Take 237 mLs by mouth 3 (three) times daily between meals. (Patient not taking: Reported on 08/13/2017)  . [DISCONTINUED] furosemide (LASIX) 40 MG tablet Take 1 tablet (40 mg total) by mouth 2 (two) times daily. (Patient  taking differently: Take 40 mg by mouth daily. )  . [DISCONTINUED] guaiFENesin (MUCINEX) 600 MG 12 hr tablet Take 1,200 mg by mouth 2 (two) times daily.   . [DISCONTINUED] hydrOXYzine (ATARAX/VISTARIL) 10 MG tablet Take 1 tablet (10 mg total) by mouth 3 (three) times daily as needed for anxiety.  . [DISCONTINUED] ipratropium-albuterol (DUONEB) 0.5-2.5 (3) MG/3ML SOLN Take 3 mLs by nebulization 4 (four) times daily.  . [DISCONTINUED] metoprolol tartrate (LOPRESSOR) 25 MG tablet Take 12.5 mg by mouth 2 (two) times daily.   . [DISCONTINUED] ondansetron (ZOFRAN-ODT) 4 MG disintegrating tablet Take 4 mg by mouth every 8 (eight) hours as needed for nausea or vomiting.  . [DISCONTINUED] oxyCODONE-acetaminophen (PERCOCET) 5-325 MG tablet Take 1 tablet by mouth every 4 (four) hours as needed for severe pain.  . [DISCONTINUED] senna (SENOKOT) 8.6 MG TABS tablet Take 2 tablets by mouth 2 (two) times daily.    No facility-administered encounter medications on file as  of 08/11/2017.     PHYSICAL EXAM:   General: Chronically ill and  frail appearing, thin.  Much older than stated age Cardiovascular: regular rate and rhythm Pulmonary: Diminished breath sounds in all lung fields.  O2 via [place] in use Abdomen: soft, tender, rounded + bowel sounds Extremities: no edema, no joint deformities Skin: Exposed skin is intact Neurological: Weakness, A&O x 3  Gonzella Lex, NP-C

## 2017-08-25 NOTE — ED Provider Notes (Signed)
Emergency Department Provider Note   I have reviewed the triage vital signs and the nursing notes.   HISTORY  Chief Complaint Herpes Zoster and Generalized Body Aches   HPI [name] is a 57 y.o. male with PMH of stage IV lung cancer and recent herpes zoster diagnosis presents to the ED with severe total body pain. He notes baseline difficulty breathing. No fever. No chills. Denies pain in any location worse than any other. He has been taking his home medication without significant relief. He was admitted and discharged on the 8/2 with respiratory failure. Palliative care and hospice discussed at that time but patient not ready for that program. Has been seen in the ED twice since with similar pain issues.    Past Medical History:  Diagnosis Date  . Abdominal pain 06/04/2016  . Adenocarcinoma of left lung, stage 4 ([place]) 05/02/2016  . Alcohol abuse   . Bronchitis due to tobacco use ([place])   . Cancer ([place])    Lung  . COPD (chronic obstructive pulmonary disease) ([place])    on home o2 2LNC   . Dehydration 06/04/2016  . Encounter for antineoplastic chemotherapy 05/02/2016  . Gastritis   . Goals of care, counseling/discussion 05/02/2016  . Hematemesis   . HTN (hypertension) 10/30/2016  . Pulmonary embolism ([place]) 06/30/2017   on CTA chest  . Recurrent upper respiratory infection (URI)   . Seizures ([place]) 05/2011   new onset    Patient Active Problem List   Diagnosis Date Noted  . Steroid-induced hyperglycemia 08/19/2017  . Acute on chronic respiratory failure with hypoxia and hypercapnia ([place]) 08/13/2017  . Protein-calorie malnutrition, severe 07/31/2017  . Thrombocytopenia ([place]) 07/29/2017  . Wheezing on exhalation 07/29/2017  . Anemia 07/24/2017  . Normocytic anemia 07/24/2017  . Chronic diastolic (congestive) heart failure ([place]) 07/24/2017  . Recurrent pleural effusion on right 07/15/2017  . Acute on chronic respiratory failure ([place]) 07/11/2017  . History of pulmonary  embolism 07/11/2017  . PE (pulmonary thromboembolism) ([place]) 06/30/2017  . Acute kidney injury superimposed on CKD ([place]) 06/25/2017  . Dyspnea 06/21/2017  . Chronic combined systolic (congestive) and diastolic (congestive) heart failure ([place])   . Constipation 06/07/2017  . Pain   . Acute on chronic respiratory failure with hypoxia ([place]) 05/28/2017  . Chronic respiratory failure with hypoxia ([place]) 04/01/2017  . Leucocytosis 03/16/2017  . CKD (chronic kidney disease), stage III ([place]) 03/12/2017  . Malnutrition of moderate degree 03/10/2017  . COPD exacerbation ([place]) 03/09/2017  . Macrocytic anemia 02/07/2017  . Severe malnutrition ([place]) 01/03/2017  . DOE (dyspnea on exertion) 12/30/2016  . Nausea   . Hypervolemia   . HCAP (healthcare-associated pneumonia) 12/19/2016  . SOB (shortness of breath) 12/19/2016  . Erosive gastropathy 12/18/2016  . Gastritis 12/18/2016  . Hematemesis 12/16/2016  . Intractable nausea and vomiting 12/16/2016  . HTN (hypertension) 10/30/2016  . Chronic obstructive pulmonary disease ([place]) 09/02/2016  . COPD (chronic obstructive pulmonary disease) ([place]) 08/19/2016  . Dehydration 06/04/2016  . Abdominal pain 06/04/2016  . 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 Adenocarcinoma of left lung, stage 4 ([place]) 05/02/2016  . Goals of care, counseling/discussion 05/02/2016  . Encounter for antineoplastic chemotherapy 05/02/2016  . Tobacco abuse 05/30/2011  . Alcohol abuse 05/30/2011  . Seizure ([place]) 05/28/2011    Past Surgical History:  Procedure Laterality Date  . ESOPHAGOGASTRODUODENOSCOPY (EGD) WITH PROPOFOL N/A 12/17/2016   Procedure: ESOPHAGOGASTRODUODENOSCOPY (EGD) WITH PROPOFOL;  Surgeon: Ladene Artist, MD;  Location: [facility] ENDOSCOPY;  Service:  Endoscopy;  Laterality: N/A;  . IR FLUORO GUIDE PORT INSERTION RIGHT  05/13/2016  . IR US GUIDE VASC ACCESS RIGHT  05/13/2016  . NO PAST SURGERIES    . THORACENTESIS  07/05/2017   ultrasound guided     Allergies Patient has no known allergies.  Family History  Problem Relation Age of Onset  . Cancer Father   . Diabetes Mellitus II Mother     Social History Social History   Tobacco Use  . Smoking status: Current Some Day Smoker    Packs/day: 0.10    Years: 30.00    Pack years: 3.00    Types: Cigarettes  . Smokeless tobacco: Never Used  . Tobacco comment: 1 cig a week  Substance Use Topics  . Alcohol use: No    Frequency: Never  . Drug use: No    Review of Systems  Constitutional: No fever/chills. Positive total body pain.  Eyes: No visual changes. ENT: No sore throat. Cardiovascular: Denies chest pain. Respiratory: Positive shortness of breath. Gastrointestinal: No abdominal pain.  No nausea, no vomiting.  No diarrhea.  No constipation. Genitourinary: Negative for dysuria. Musculoskeletal: Positive for back pain. Skin: Negative for rash. Neurological: Negative for focal weakness or numbness. Positive HA.   10-point ROS otherwise negative.  ____________________________________________   PHYSICAL EXAM:  VITAL SIGNS: ED Triage Vitals  Enc Vitals Group     BP 08/24/17 2027 (!) 141/108     Pulse Rate 08/24/17 2027 (!) 127     Resp 08/24/17 2027 (!) 21     Temp 08/24/17 2027 98.1 F (36.7 C)     Temp Source 08/24/17 2027 Oral     SpO2 08/24/17 2017 93 %     Weight 08/24/17 2020 118 lb (53.5 kg)     Height 08/24/17 2020 5\' 11"  (1.803 m)     Pain Score 08/24/17 2032 9   Constitutional: Alert and oriented. Chronically ill-appearing and thin. No acute distress.  Eyes: Conjunctivae are normal. Head: Atraumatic. Nose: No congestion/rhinnorhea. Mouth/Throat: Mucous membranes are dry.  Neck: No stridor.  Cardiovascular: [place]. Good peripheral circulation. Grossly normal heart sounds.   Respiratory: Increased respiratory effort.  No retractions. Lungs CTAB. Gastrointestinal: Soft and nontender. No distention.  Musculoskeletal: No lower extremity  tenderness nor edema. No gross deformities of extremities. Neurologic:  Normal speech and language. No gross focal neurologic deficits are appreciated.  Skin:  Skin is warm, dry and intact. Rash over the right lateral chest wall consistent with shingles.    ____________________________________________   LABS (all labs ordered are listed, but only abnormal results are displayed)  Labs Reviewed  COMPREHENSIVE METABOLIC PANEL - Abnormal; Notable for the following components:      Result Value   Chloride 90 (*)    CO2 41 (*)    Glucose, Bld 112 (*)    BUN 33 (*)    Creatinine, Ser 1.47 (*)    GFR calc non Af Amer 51 (*)    GFR calc Af Amer 59 (*)    All other components within normal limits  CBC WITH DIFFERENTIAL/PLATELET - Abnormal; Notable for the following components:   WBC 16.9 (*)    RBC 2.78 (*)    Hemoglobin 9.3 (*)    HCT 29.4 (*)    MCV 105.8 (*)    RDW 18.0 (*)    Neutro Abs 15.1 (*)    All other components within normal limits  LIPASE, BLOOD   ____________________________________________  EKG   EKG Interpretation  Date/Time:  Sunday August 24 2017 20:29:11 EDT Ventricular Rate:  127 PR Interval:    QRS Duration: 69 QT Interval:  315 QTC Calculation: 458 R Axis:   108 Text Interpretation:  Sinus tachycardia Paired ventricular premature complexes Aberrant complex Probable anterior infarct, old No STEMI.  Confirmed by Nanda Quinton [phone]) on 08/24/2017 9:04:02 PM Also confirmed by Nanda Quinton [phone]), editor Hattie Perch ([id number])  on 08/25/2017 7:43:50 AM       ____________________________________________  RADIOLOGY  Dg Chest 2 View  Result Date: 08/24/2017 CLINICAL DATA:  Generalized pain related to shingles diagnosis yesterday on RIGHT posterior thorax EXAM: CHEST - 2 VIEW COMPARISON:  08/23/2017 Correlation: Multiple prior CT chest exam is FINDINGS: RIGHT jugular Port-A-Cath with tip projecting over RIGHT atrium. Normal heart size, mediastinal  contours, and pulmonary vascularity. Persistent RIGHT pleural effusion and basilar atelectasis. Tiny LEFT pleural effusion. Underlying emphysematous changes. Persistent LEFT suprahilar stellate density, assessed by multiple prior CT exams and felt to represent scarring. No segmental consolidation or pneumothorax. Osseous structures unremarkable. IMPRESSION: Persistent RIGHT basilar pleural effusion and atelectasis. Emphysematous changes with tiny LEFT pleural effusion and chronic LEFT suprahilar stellate density felt by prior CT exams to be related to parenchymal scarring. Electronically Signed   By: Lavonia Dana M.D.   On: 08/24/2017 21:22    ____________________________________________   PROCEDURES  Procedure(s) performed:   Procedures  None ____________________________________________   INITIAL IMPRESSION / ASSESSMENT AND PLAN / ED COURSE  Pertinent labs & imaging results that were available during my care of the patient were reviewed by me and considered in my medical decision making (see chart for details).  Patient returns to the ED with total body pain and dyspnea. Improved here with pain medication and neb therapy. Patient does have persistent tachycardia. Had CTA to r/o PE in the last 24 hours which was negative. Will not repeat at this time. CXR unchanged. Labs reviewed with no acute changes. Plan for discharge back to ALF to have continued discussion regarding pain mgmt and palliative care discussions.   At this time, I do not feel there is any life-threatening condition present. I have reviewed and discussed all results (EKG, imaging, lab, urine as appropriate), exam findings with patient. I have reviewed nursing notes and appropriate previous records.  I feel the patient is safe to be discharged home without further emergent workup. Discussed usual and customary return precautions. Patient and family (if present) verbalize understanding and are comfortable with this plan.  Patient  will follow-up with their primary care provider. If they do not have a primary care provider, information for follow-up has been provided to them. All questions have been answered.  ____________________________________________  FINAL CLINICAL IMPRESSION(S) / ED DIAGNOSES  Final diagnoses:  Herpes zoster without complication  Pain  SOB (shortness of breath)     MEDICATIONS GIVEN DURING THIS VISIT:  Medications  sodium chloride 0.9 % bolus 1,000 mL (0 mLs Intravenous Stopped 08/24/17 2247)  HYDROmorphone (DILAUDID) injection 1 mg (1 mg Intravenous Given 08/24/17 2119)  ipratropium-albuterol (DUONEB) 0.5-2.5 (3) MG/3ML nebulizer solution 3 mL (3 mLs Nebulization Given 08/24/17 2109)  heparin lock flush 100 unit/mL (500 Units Intracatheter Given 08/24/17 2323)  HYDROmorphone (DILAUDID) injection 1 mg (1 mg Intravenous Given 08/24/17 2319)    Note:  This document was prepared using Dragon voice recognition software and may include unintentional dictation errors.  Nanda Quinton, MD Emergency Medicine     Sokhna Christoph, [facility], MD 08/25/17 1136

## 2017-08-26 ENCOUNTER — Inpatient Hospital Stay: Payer: Medicaid Other

## 2017-08-26 ENCOUNTER — Other Ambulatory Visit: Payer: Self-pay | Admitting: Medical Oncology

## 2017-08-26 ENCOUNTER — Telehealth: Payer: Self-pay | Admitting: Medical Oncology

## 2017-08-26 ENCOUNTER — Ambulatory Visit: Payer: Medicaid Other

## 2017-08-26 ENCOUNTER — Inpatient Hospital Stay: Payer: Medicaid Other | Attending: Internal Medicine

## 2017-08-26 ENCOUNTER — Encounter: Payer: Self-pay | Admitting: Internal Medicine

## 2017-08-26 ENCOUNTER — Inpatient Hospital Stay (HOSPITAL_BASED_OUTPATIENT_CLINIC_OR_DEPARTMENT_OTHER): Payer: Medicaid Other | Admitting: Internal Medicine

## 2017-08-26 ENCOUNTER — Telehealth: Payer: Self-pay | Admitting: Internal Medicine

## 2017-08-26 VITALS — BP 145/89 | HR 118 | Temp 98.3°F | Resp 17 | Ht 71.0 in | Wt 114.9 lb

## 2017-08-26 DIAGNOSIS — B029 Zoster without complications: Secondary | ICD-10-CM

## 2017-08-26 DIAGNOSIS — Z923 Personal history of irradiation: Secondary | ICD-10-CM

## 2017-08-26 DIAGNOSIS — C3492 Malignant neoplasm of unspecified part of left bronchus or lung: Secondary | ICD-10-CM

## 2017-08-26 DIAGNOSIS — J90 Pleural effusion, not elsewhere classified: Secondary | ICD-10-CM

## 2017-08-26 DIAGNOSIS — I10 Essential (primary) hypertension: Secondary | ICD-10-CM

## 2017-08-26 DIAGNOSIS — D649 Anemia, unspecified: Secondary | ICD-10-CM

## 2017-08-26 DIAGNOSIS — D6481 Anemia due to antineoplastic chemotherapy: Secondary | ICD-10-CM | POA: Diagnosis not present

## 2017-08-26 DIAGNOSIS — Z7901 Long term (current) use of anticoagulants: Secondary | ICD-10-CM

## 2017-08-26 DIAGNOSIS — R634 Abnormal weight loss: Secondary | ICD-10-CM

## 2017-08-26 DIAGNOSIS — E440 Moderate protein-calorie malnutrition: Secondary | ICD-10-CM

## 2017-08-26 DIAGNOSIS — T451X5A Adverse effect of antineoplastic and immunosuppressive drugs, initial encounter: Secondary | ICD-10-CM

## 2017-08-26 DIAGNOSIS — I2699 Other pulmonary embolism without acute cor pulmonale: Secondary | ICD-10-CM | POA: Diagnosis not present

## 2017-08-26 DIAGNOSIS — C7951 Secondary malignant neoplasm of bone: Secondary | ICD-10-CM

## 2017-08-26 DIAGNOSIS — I5042 Chronic combined systolic (congestive) and diastolic (congestive) heart failure: Secondary | ICD-10-CM

## 2017-08-26 LAB — COMPREHENSIVE METABOLIC PANEL
ALBUMIN: 2.9 g/dL — AB (ref 3.5–5.0)
ALK PHOS: 127 U/L — AB (ref 38–126)
ALT: 16 U/L (ref 0–44)
AST: 21 U/L (ref 15–41)
Anion gap: 9 (ref 5–15)
BILIRUBIN TOTAL: 0.2 mg/dL — AB (ref 0.3–1.2)
BUN: 22 mg/dL — AB (ref 6–20)
CO2: 36 mmol/L — ABNORMAL HIGH (ref 22–32)
Calcium: 9 mg/dL (ref 8.9–10.3)
Chloride: 95 mmol/L — ABNORMAL LOW (ref 98–111)
Creatinine, Ser: 1.58 mg/dL — ABNORMAL HIGH (ref 0.61–1.24)
GFR calc Af Amer: 54 mL/min — ABNORMAL LOW (ref >60)
GFR calc non Af Amer: 47 mL/min — ABNORMAL LOW (ref >60)
GLUCOSE: 98 mg/dL (ref 70–99)
POTASSIUM: 3.9 mmol/L (ref 3.5–5.1)
Sodium: 140 mmol/L (ref 135–145)
TOTAL PROTEIN: 6.2 g/dL — AB (ref 6.5–8.1)

## 2017-08-26 LAB — CBC WITH DIFFERENTIAL/PLATELET
BASOS ABS: 0.1 10*3/uL (ref 0.0–0.1)
Basophils Relative: 0 %
Eosinophils Absolute: 0 10*3/uL (ref 0.0–0.5)
Eosinophils Relative: 0 %
HEMATOCRIT: 24.4 % — AB (ref 38.4–49.9)
HEMOGLOBIN: 7.9 g/dL — AB (ref 13.0–17.1)
LYMPHS PCT: 2 %
Lymphs Abs: 0.2 10*3/uL — ABNORMAL LOW (ref 0.9–3.3)
MCH: 32.7 pg (ref 27.2–33.4)
MCHC: 32.3 g/dL (ref 32.0–36.0)
MCV: 101 fL — ABNORMAL HIGH (ref 79.3–98.0)
Monocytes Absolute: 1.5 10*3/uL — ABNORMAL HIGH (ref 0.1–0.9)
Monocytes Relative: 12 %
Neutro Abs: 10.9 10*3/uL — ABNORMAL HIGH (ref 1.5–6.5)
Neutrophils Relative %: 86 %
Platelets: 177 10*3/uL (ref 140–400)
RBC: 2.41 MIL/uL — AB (ref 4.20–5.82)
RDW: 18.8 % — AB (ref 11.0–14.6)
WBC: 12.7 10*3/uL — AB (ref 4.0–10.3)

## 2017-08-26 LAB — PREPARE RBC (CROSSMATCH)

## 2017-08-26 MED ORDER — SODIUM CHLORIDE 0.9% FLUSH
10.0000 mL | INTRAVENOUS | Status: AC | PRN
  Administered 2017-08-26: 10 mL
  Filled 2017-08-26: qty 10

## 2017-08-26 MED ORDER — OXYCODONE-ACETAMINOPHEN 5-325 MG PO TABS
ORAL_TABLET | ORAL | Status: AC
  Filled 2017-08-26: qty 1

## 2017-08-26 MED ORDER — HEPARIN SOD (PORK) LOCK FLUSH 100 UNIT/ML IV SOLN
500.0000 [IU] | Freq: Once | INTRAVENOUS | Status: AC | PRN
Start: 2017-08-26 — End: 2017-08-26
  Administered 2017-08-26: 500 [IU]
  Filled 2017-08-26: qty 5

## 2017-08-26 MED ORDER — VALACYCLOVIR HCL 500 MG PO TABS: 500.0000 mg | ORAL_TABLET | Freq: Two times a day (BID) | ORAL | 0 refills | Status: AC

## 2017-08-26 MED ORDER — DIPHENHYDRAMINE HCL 25 MG PO CAPS
ORAL_CAPSULE | ORAL | Status: AC
  Filled 2017-08-26: qty 1

## 2017-08-26 MED ORDER — ACETAMINOPHEN 325 MG PO TABS
ORAL_TABLET | ORAL | Status: AC
  Filled 2017-08-26: qty 2

## 2017-08-26 MED ORDER — ACETAMINOPHEN 325 MG PO TABS
650.0000 mg | ORAL_TABLET | Freq: Once | ORAL | Status: AC
  Administered 2017-08-26: 650 mg via ORAL

## 2017-08-26 MED ORDER — DIPHENHYDRAMINE HCL 25 MG PO CAPS
25.0000 mg | ORAL_CAPSULE | Freq: Once | ORAL | Status: AC
  Administered 2017-08-26: 25 mg via ORAL

## 2017-08-26 MED ORDER — HEPARIN SOD (PORK) LOCK FLUSH 100 UNIT/ML IV SOLN
500.0000 [IU] | Freq: Every day | INTRAVENOUS | Status: AC | PRN
  Administered 2017-08-26: 500 [IU]
  Filled 2017-08-26: qty 5

## 2017-08-26 MED ORDER — OXYCODONE-ACETAMINOPHEN 5-325 MG PO TABS
1.0000 | ORAL_TABLET | ORAL | Status: AC
  Administered 2017-08-26: 1 via ORAL

## 2017-08-26 MED ORDER — SODIUM CHLORIDE 0.9% IV SOLUTION
250.0000 mL | Freq: Once | INTRAVENOUS | Status: AC
  Administered 2017-08-26: 250 mL via INTRAVENOUS
  Filled 2017-08-26: qty 250

## 2017-08-26 NOTE — Telephone Encounter (Signed)
Updated [name] that pt received percocet 5/325 mg today at cancer center.

## 2017-08-26 NOTE — Telephone Encounter (Signed)
I called [facility] and told the staff that pt cannot get payment for chemo if he is under hospice care. She said pt is coming in today.
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
## 2017-08-26 NOTE — Telephone Encounter (Signed)
Pt requests refill for pain med. I left message for  Goria at [facility] to call back with Oxycodone  pill count.

## 2017-08-26 NOTE — Patient Instructions (Signed)
Blood Transfusion, Adult, Care After This sheet gives you information about how to care for yourself after your procedure. Your health care provider may also give you more specific instructions. If you have problems or questions, contact your health care provider. What can I expect after the procedure? After your procedure, it is common to have:  Bruising and soreness where the IV tube was inserted.  Headache.  Follow these instructions at home:  Take over-the-counter and prescription medicines only as told by your health care provider.  Return to your normal activities as told by your health care provider.  Follow instructions from your health care provider about how to take care of your IV insertion site. Make sure you: ? Wash your hands with soap and water before you change your bandage (dressing). If soap and water are not available, use hand sanitizer. ? Change your dressing as told by your health care provider.  Check your IV insertion site every day for signs of infection. Check for: ? More redness, swelling, or pain. ? More fluid or blood. ? Warmth. ? Pus or a bad smell. Contact a health care provider if:  You have more redness, swelling, or pain around the IV insertion site.  You have more fluid or blood coming from the IV insertion site.  Your IV insertion site feels warm to the touch.  You have pus or a bad smell coming from the IV insertion site.  Your urine turns pink, red, or brown.  You feel weak after doing your normal activities. Get help right away if:  You have signs of a serious allergic or immune system reaction, including: ? Itchiness. ? Hives. ? Trouble breathing. ? Anxiety. ? Chest or lower back pain. ? Fever, flushing, and chills. ? Rapid pulse. ? Rash. ? Diarrhea. ? Vomiting. ? Dark urine. ? Serious headache. ? Dizziness. ? Stiff neck. ? Yellow coloration of the face or the white parts of the eyes (jaundice). This information is not  intended to replace advice given to you by your health care provider. Make sure you discuss any questions you have with your health care provider. Document Released: 01/28/2014 Document Revised: 09/06/2015 Document Reviewed: 07/24/2015 Elsevier Interactive Patient Education  2018 [employer]  

## 2017-08-26 NOTE — Telephone Encounter (Signed)
I told [name] that pt is not getting any more treatment for his cancer and that [facility] referred him to hospice . Pt is getting 2 units of blood today.

## 2017-08-26 NOTE — Telephone Encounter (Signed)
Cancelled appt per 8/6 los - left message for patient

## 2017-08-27 ENCOUNTER — Emergency Department (HOSPITAL_COMMUNITY)

## 2017-08-27 ENCOUNTER — Other Ambulatory Visit: Payer: Self-pay

## 2017-08-27 ENCOUNTER — Emergency Department (HOSPITAL_COMMUNITY)
Admission: EM | Admit: 2017-08-27 | Discharge: 2017-08-28 | Disposition: A | Discharge status: Home / Self Care | Source: Home / Self Care | Attending: Emergency Medicine | Admitting: Emergency Medicine

## 2017-08-27 ENCOUNTER — Encounter (HOSPITAL_COMMUNITY): Payer: Self-pay

## 2017-08-27 DIAGNOSIS — N183 Chronic kidney disease, stage 3 (moderate): Secondary | ICD-10-CM | POA: Diagnosis present

## 2017-08-27 DIAGNOSIS — Z7901 Long term (current) use of anticoagulants: Secondary | ICD-10-CM

## 2017-08-27 DIAGNOSIS — B029 Zoster without complications: Secondary | ICD-10-CM | POA: Diagnosis present

## 2017-08-27 DIAGNOSIS — Z7951 Long term (current) use of inhaled steroids: Secondary | ICD-10-CM

## 2017-08-27 DIAGNOSIS — E1165 Type 2 diabetes mellitus with hyperglycemia: Secondary | ICD-10-CM | POA: Diagnosis present

## 2017-08-27 DIAGNOSIS — Z7952 Long term (current) use of systemic steroids: Secondary | ICD-10-CM

## 2017-08-27 DIAGNOSIS — J441 Chronic obstructive pulmonary disease with (acute) exacerbation: Secondary | ICD-10-CM

## 2017-08-27 DIAGNOSIS — Z9981 Dependence on supplemental oxygen: Secondary | ICD-10-CM

## 2017-08-27 DIAGNOSIS — Z86711 Personal history of pulmonary embolism: Secondary | ICD-10-CM

## 2017-08-27 DIAGNOSIS — Z9114 Patient's other noncompliance with medication regimen: Secondary | ICD-10-CM

## 2017-08-27 DIAGNOSIS — F17210 Nicotine dependence, cigarettes, uncomplicated: Secondary | ICD-10-CM | POA: Diagnosis present

## 2017-08-27 DIAGNOSIS — Y95 Nosocomial condition: Secondary | ICD-10-CM | POA: Diagnosis present

## 2017-08-27 DIAGNOSIS — Z833 Family history of diabetes mellitus: Secondary | ICD-10-CM

## 2017-08-27 DIAGNOSIS — Z794 Long term (current) use of insulin: Secondary | ICD-10-CM

## 2017-08-27 DIAGNOSIS — C7951 Secondary malignant neoplasm of bone: Secondary | ICD-10-CM | POA: Diagnosis present

## 2017-08-27 DIAGNOSIS — J440 Chronic obstructive pulmonary disease with acute lower respiratory infection: Secondary | ICD-10-CM | POA: Diagnosis present

## 2017-08-27 DIAGNOSIS — Z86718 Personal history of other venous thrombosis and embolism: Secondary | ICD-10-CM

## 2017-08-27 DIAGNOSIS — Z9221 Personal history of antineoplastic chemotherapy: Secondary | ICD-10-CM

## 2017-08-27 DIAGNOSIS — I130 Hypertensive heart and chronic kidney disease with heart failure and stage 1 through stage 4 chronic kidney disease, or unspecified chronic kidney disease: Secondary | ICD-10-CM | POA: Diagnosis not present

## 2017-08-27 DIAGNOSIS — C3492 Malignant neoplasm of unspecified part of left bronchus or lung: Secondary | ICD-10-CM | POA: Diagnosis present

## 2017-08-27 DIAGNOSIS — Z515 Encounter for palliative care: Secondary | ICD-10-CM | POA: Diagnosis present

## 2017-08-27 DIAGNOSIS — Z809 Family history of malignant neoplasm, unspecified: Secondary | ICD-10-CM

## 2017-08-27 DIAGNOSIS — I5033 Acute on chronic diastolic (congestive) heart failure: Secondary | ICD-10-CM | POA: Diagnosis present

## 2017-08-27 DIAGNOSIS — K2970 Gastritis, unspecified, without bleeding: Secondary | ICD-10-CM | POA: Diagnosis present

## 2017-08-27 DIAGNOSIS — E1122 Type 2 diabetes mellitus with diabetic chronic kidney disease: Secondary | ICD-10-CM | POA: Diagnosis present

## 2017-08-27 DIAGNOSIS — J9621 Acute and chronic respiratory failure with hypoxia: Secondary | ICD-10-CM | POA: Diagnosis present

## 2017-08-27 DIAGNOSIS — J189 Pneumonia, unspecified organism: Secondary | ICD-10-CM | POA: Diagnosis present

## 2017-08-27 DIAGNOSIS — Z79899 Other long term (current) drug therapy: Secondary | ICD-10-CM

## 2017-08-27 DIAGNOSIS — T380X5A Adverse effect of glucocorticoids and synthetic analogues, initial encounter: Secondary | ICD-10-CM | POA: Diagnosis present

## 2017-08-27 DIAGNOSIS — Z66 Do not resuscitate: Secondary | ICD-10-CM | POA: Diagnosis present

## 2017-08-27 DIAGNOSIS — F419 Anxiety disorder, unspecified: Secondary | ICD-10-CM | POA: Diagnosis present

## 2017-08-27 DIAGNOSIS — R569 Unspecified convulsions: Secondary | ICD-10-CM | POA: Diagnosis present

## 2017-08-27 HISTORY — DX: Zoster without complications: B02.9

## 2017-08-27 LAB — COMPREHENSIVE METABOLIC PANEL
ALT: 14 U/L (ref 0–44)
AST: 21 U/L (ref 15–41)
Albumin: 3.1 g/dL — ABNORMAL LOW (ref 3.5–5.0)
Alkaline Phosphatase: 120 U/L (ref 38–126)
Anion gap: 9 (ref 5–15)
BUN: 20 mg/dL (ref 6–20)
CO2: 38 mmol/L — ABNORMAL HIGH (ref 22–32)
Calcium: 9.1 mg/dL (ref 8.9–10.3)
Chloride: 98 mmol/L (ref 98–111)
Creatinine, Ser: 1.5 mg/dL — ABNORMAL HIGH (ref 0.61–1.24)
GFR calc Af Amer: 58 mL/min — ABNORMAL LOW (ref >60)
GFR calc non Af Amer: 50 mL/min — ABNORMAL LOW (ref >60)
Glucose, Bld: 92 mg/dL (ref 70–99)
Potassium: 3.5 mmol/L (ref 3.5–5.1)
Sodium: 145 mmol/L (ref 135–145)
Total Bilirubin: 0.4 mg/dL (ref 0.3–1.2)
Total Protein: 6.3 g/dL — ABNORMAL LOW (ref 6.5–8.1)

## 2017-08-27 LAB — CBC WITH DIFFERENTIAL/PLATELET
Basophils Absolute: 0 10*3/uL (ref 0.0–0.1)
Basophils Relative: 0 %
Eosinophils Absolute: 0 10*3/uL (ref 0.0–0.7)
Eosinophils Relative: 0 %
HCT: 33.1 % — ABNORMAL LOW (ref 39.0–52.0)
Hemoglobin: 10.8 g/dL — ABNORMAL LOW (ref 13.0–17.0)
Lymphocytes Relative: 8 %
Lymphs Abs: 1.2 10*3/uL (ref 0.7–4.0)
MCH: 32.5 pg (ref 26.0–34.0)
MCHC: 32.6 g/dL (ref 30.0–36.0)
MCV: 99.7 fL (ref 78.0–100.0)
Monocytes Absolute: 0.8 10*3/uL (ref 0.1–1.0)
Monocytes Relative: 5 %
Neutro Abs: 13.2 10*3/uL — ABNORMAL HIGH (ref 1.7–7.7)
Neutrophils Relative %: 87 %
Platelets: 169 10*3/uL (ref 150–400)
RBC: 3.32 MIL/uL — ABNORMAL LOW (ref 4.22–5.81)
RDW: 18.6 % — ABNORMAL HIGH (ref 11.5–15.5)
WBC: 15.1 10*3/uL — ABNORMAL HIGH (ref 4.0–10.5)

## 2017-08-27 LAB — TYPE AND SCREEN
ABO/RH(D): O POS
ANTIBODY SCREEN: NEGATIVE
Unit division: 0
Unit division: 0

## 2017-08-27 LAB — BPAM RBC
Blood Product Expiration Date: 201909072359
Blood Product Expiration Date: 201909072359
ISSUE DATE / TIME: 201908061317
ISSUE DATE / TIME: 201908061317
UNIT TYPE AND RH: 5100
Unit Type and Rh: 5100

## 2017-08-27 MED ORDER — ONDANSETRON HCL 4 MG/2ML IJ SOLN
4.0000 mg | Freq: Once | INTRAMUSCULAR | Status: AC
  Administered 2017-08-27: 4 mg via INTRAVENOUS
  Filled 2017-08-27: qty 2

## 2017-08-27 MED ORDER — HYDROMORPHONE HCL 1 MG/ML IJ SOLN
1.0000 mg | Freq: Once | INTRAMUSCULAR | Status: AC
  Administered 2017-08-27: 1 mg via INTRAVENOUS
  Filled 2017-08-27: qty 1

## 2017-08-27 MED ORDER — SODIUM CHLORIDE 0.9 % IV BOLUS
500.0000 mL | Freq: Once | INTRAVENOUS | Status: AC
  Administered 2017-08-27: 500 mL via INTRAVENOUS

## 2017-08-27 MED ORDER — IPRATROPIUM-ALBUTEROL 0.5-2.5 (3) MG/3ML IN SOLN
3.0000 mL | Freq: Once | RESPIRATORY_TRACT | Status: AC
  Administered 2017-08-27: 3 mL via RESPIRATORY_TRACT
  Filled 2017-08-27: qty 3

## 2017-08-27 MED ORDER — IPRATROPIUM-ALBUTEROL 0.5-2.5 (3) MG/3ML IN SOLN: 3.0000 mL | Freq: Once | RESPIRATORY_TRACT | Status: DC

## 2017-08-27 MED ORDER — SODIUM CHLORIDE 0.9 % IV SOLN
INTRAVENOUS | Status: DC
  Administered 2017-08-27 (×2): via INTRAVENOUS

## 2017-08-27 NOTE — ED Triage Notes (Signed)
EMS reports from home, c/o increasing generalized back pain, has active shingles on right rib area, ongoing SOB with lung cancer and COPD, on 3lts 24-7. Hx of lung CA and Diabetes.  BP 140/96 HR 110 Resp 20 Sp02 98 on 3lts  CBG 161

## 2017-08-27 NOTE — ED Notes (Signed)
Bed: ES92 Expected date:  Expected time:  Means of arrival:  Comments: EMS- active shingles

## 2017-08-27 NOTE — Care Management Note (Addendum)
Case Management Note  Pt is active with [facility] and [facility] palliative team in transition with a referral to [facility] hospice.  All 3 contacts are aware pt is in the ED and will follow for needs.  Atoya Andrew, Benjaman Lobe, RN 08/27/2017, 4:41 PM

## 2017-08-28 ENCOUNTER — Emergency Department (HOSPITAL_COMMUNITY)
Admission: EM | Admit: 2017-08-28 | Discharge: 2017-08-28 | Disposition: A | Discharge status: Home / Self Care | Source: Home / Self Care | Attending: Emergency Medicine | Admitting: Emergency Medicine

## 2017-08-28 ENCOUNTER — Other Ambulatory Visit: Payer: Self-pay

## 2017-08-28 ENCOUNTER — Emergency Department (HOSPITAL_COMMUNITY)

## 2017-08-28 ENCOUNTER — Inpatient Hospital Stay (HOSPITAL_COMMUNITY)
Admission: EM | Admit: 2017-08-28 | Discharge: 2017-09-21 | DRG: 291 | Disposition: E | Attending: Family Medicine | Admitting: Family Medicine

## 2017-08-28 ENCOUNTER — Encounter (HOSPITAL_COMMUNITY): Payer: Self-pay | Admitting: *Deleted

## 2017-08-28 ENCOUNTER — Telehealth: Payer: Self-pay | Admitting: Internal Medicine

## 2017-08-28 DIAGNOSIS — N183 Chronic kidney disease, stage 3 unspecified: Secondary | ICD-10-CM | POA: Diagnosis present

## 2017-08-28 DIAGNOSIS — Z515 Encounter for palliative care: Secondary | ICD-10-CM

## 2017-08-28 DIAGNOSIS — I5033 Acute on chronic diastolic (congestive) heart failure: Secondary | ICD-10-CM | POA: Diagnosis present

## 2017-08-28 DIAGNOSIS — C3492 Malignant neoplasm of unspecified part of left bronchus or lung: Secondary | ICD-10-CM | POA: Diagnosis present

## 2017-08-28 DIAGNOSIS — J441 Chronic obstructive pulmonary disease with (acute) exacerbation: Secondary | ICD-10-CM | POA: Diagnosis present

## 2017-08-28 DIAGNOSIS — C799 Secondary malignant neoplasm of unspecified site: Secondary | ICD-10-CM

## 2017-08-28 DIAGNOSIS — J449 Chronic obstructive pulmonary disease, unspecified: Secondary | ICD-10-CM

## 2017-08-28 DIAGNOSIS — J9621 Acute and chronic respiratory failure with hypoxia: Secondary | ICD-10-CM | POA: Diagnosis present

## 2017-08-28 DIAGNOSIS — I10 Essential (primary) hypertension: Secondary | ICD-10-CM | POA: Diagnosis present

## 2017-08-28 LAB — COMPREHENSIVE METABOLIC PANEL
ALT: 17 U/L (ref 0–44)
ANION GAP: 13 (ref 5–15)
AST: 22 U/L (ref 15–41)
Albumin: 3.2 g/dL — ABNORMAL LOW (ref 3.5–5.0)
Alkaline Phosphatase: 118 U/L (ref 38–126)
BUN: 19 mg/dL (ref 6–20)
CALCIUM: 9.1 mg/dL (ref 8.9–10.3)
CHLORIDE: 97 mmol/L — AB (ref 98–111)
CO2: 32 mmol/L (ref 22–32)
Creatinine, Ser: 1.53 mg/dL — ABNORMAL HIGH (ref 0.61–1.24)
GFR calc non Af Amer: 48 mL/min — ABNORMAL LOW (ref >60)
GFR, EST AFRICAN AMERICAN: 56 mL/min — AB (ref >60)
Glucose, Bld: 140 mg/dL — ABNORMAL HIGH (ref 70–99)
POTASSIUM: 3.5 mmol/L (ref 3.5–5.1)
SODIUM: 142 mmol/L (ref 135–145)
Total Bilirubin: 0.8 mg/dL (ref 0.3–1.2)
Total Protein: 6.7 g/dL (ref 6.5–8.1)

## 2017-08-28 LAB — URINALYSIS, ROUTINE W REFLEX MICROSCOPIC
Bilirubin Urine: NEGATIVE
GLUCOSE, UA: NEGATIVE mg/dL
HGB URINE DIPSTICK: NEGATIVE
Ketones, ur: NEGATIVE mg/dL
LEUKOCYTES UA: NEGATIVE
NITRITE: NEGATIVE
PH: 5 (ref 5.0–8.0)
PROTEIN: 100 mg/dL — AB
SPECIFIC GRAVITY, URINE: 1.015 (ref 1.005–1.030)

## 2017-08-28 LAB — CBC WITH DIFFERENTIAL/PLATELET
Basophils Absolute: 0 10*3/uL (ref 0.0–0.1)
Basophils Relative: 0 %
EOS ABS: 0 10*3/uL (ref 0.0–0.7)
Eosinophils Relative: 0 %
HCT: 37.1 % — ABNORMAL LOW (ref 39.0–52.0)
HEMOGLOBIN: 11.5 g/dL — AB (ref 13.0–17.0)
LYMPHS ABS: 1.3 10*3/uL (ref 0.7–4.0)
LYMPHS PCT: 9 %
MCH: 31.7 pg (ref 26.0–34.0)
MCHC: 31 g/dL (ref 30.0–36.0)
MCV: 102.2 fL — ABNORMAL HIGH (ref 78.0–100.0)
Monocytes Absolute: 0.7 10*3/uL (ref 0.1–1.0)
Monocytes Relative: 5 %
NEUTROS PCT: 86 %
Neutro Abs: 11.7 10*3/uL — ABNORMAL HIGH (ref 1.7–7.7)
Platelets: 174 10*3/uL (ref 150–400)
RBC: 3.63 MIL/uL — ABNORMAL LOW (ref 4.22–5.81)
RDW: 18.1 % — ABNORMAL HIGH (ref 11.5–15.5)
WBC: 13.8 10*3/uL — AB (ref 4.0–10.5)

## 2017-08-28 LAB — LIPASE, BLOOD: Lipase: 21 U/L (ref 11–51)

## 2017-08-28 LAB — TROPONIN I: TROPONIN I: 0.04 ng/mL — AB (ref <0.03)

## 2017-08-28 LAB — BRAIN NATRIURETIC PEPTIDE: B Natriuretic Peptide: 121.7 pg/mL — ABNORMAL HIGH (ref 0.0–100.0)

## 2017-08-28 MED ORDER — METHYLPREDNISOLONE SODIUM SUCC 125 MG IJ SOLR
125.0000 mg | Freq: Once | INTRAMUSCULAR | Status: AC
  Administered 2017-08-28: 125 mg via INTRAVENOUS
  Filled 2017-08-28: qty 2

## 2017-08-28 MED ORDER — IPRATROPIUM-ALBUTEROL 0.5-2.5 (3) MG/3ML IN SOLN
3.0000 mL | Freq: Once | RESPIRATORY_TRACT | Status: AC
  Administered 2017-08-28: 3 mL via RESPIRATORY_TRACT
  Filled 2017-08-28: qty 3

## 2017-08-28 MED ORDER — IPRATROPIUM BROMIDE 0.02 % IN SOLN
0.5000 mg | Freq: Once | RESPIRATORY_TRACT | Status: AC
  Administered 2017-08-28: 0.5 mg via RESPIRATORY_TRACT
  Filled 2017-08-28: qty 2.5

## 2017-08-28 MED ORDER — ONDANSETRON HCL 4 MG/2ML IJ SOLN
4.0000 mg | Freq: Once | INTRAMUSCULAR | Status: AC
  Administered 2017-08-28: 4 mg via INTRAVENOUS
  Filled 2017-08-28: qty 2

## 2017-08-28 MED ORDER — PROMETHAZINE HCL 25 MG/ML IJ SOLN
25.0000 mg | Freq: Once | INTRAMUSCULAR | Status: AC
  Administered 2017-08-28: 25 mg via INTRAVENOUS
  Filled 2017-08-28: qty 1

## 2017-08-28 MED ORDER — HYDROMORPHONE HCL 1 MG/ML IJ SOLN
1.0000 mg | Freq: Once | INTRAMUSCULAR | Status: AC
  Administered 2017-08-28: 1 mg via INTRAVENOUS
  Filled 2017-08-28: qty 1

## 2017-08-28 MED ORDER — IOPAMIDOL (ISOVUE-300) INJECTION 61%
100.0000 mL | Freq: Once | INTRAVENOUS | Status: AC | PRN
  Administered 2017-08-28: 80 mL via INTRAVENOUS

## 2017-08-28 MED ORDER — ALBUTEROL (5 MG/ML) CONTINUOUS INHALATION SOLN
15.0000 mg/h | INHALATION_SOLUTION | Freq: Once | RESPIRATORY_TRACT | Status: AC
  Administered 2017-08-28: 15 mg/h via RESPIRATORY_TRACT
  Filled 2017-08-28: qty 20

## 2017-08-28 MED ORDER — HEPARIN SOD (PORK) LOCK FLUSH 100 UNIT/ML IV SOLN
INTRAVENOUS | Status: AC
  Administered 2017-08-28: 01:00:00
  Filled 2017-08-28: qty 5

## 2017-08-28 MED ORDER — MAGNESIUM SULFATE 2 GM/50ML IV SOLN
2.0000 g | Freq: Once | INTRAVENOUS | Status: AC
  Administered 2017-08-28: 2 g via INTRAVENOUS
  Filled 2017-08-28: qty 50

## 2017-08-28 MED ORDER — OXYCODONE-ACETAMINOPHEN 5-325 MG PO TABS
2.0000 | ORAL_TABLET | Freq: Once | ORAL | Status: AC
  Administered 2017-08-28: 2 via ORAL
  Filled 2017-08-28: qty 2

## 2017-08-28 MED ORDER — IOPAMIDOL (ISOVUE-300) INJECTION 61%
INTRAVENOUS | Status: AC
  Filled 2017-08-28: qty 100

## 2017-08-28 NOTE — Care Management (Addendum)
ED CM received call from [facility] Nurse concerning patient who was seen earlier today by [facility] ED CM and was said to be from ALF, and receiving [facility] services [facility], who is requesting to be transferred to another ALF facility. [facility] CSW has been notified to follow up with patient.  CM noted a progress note from [facility] stating referral is pending at this time. [facility] ED CSW will meet with patient to assist with transitional care.

## 2017-08-28 NOTE — ED Notes (Signed)
Patient transported to CT 

## 2017-08-28 NOTE — ED Notes (Signed)
Humidifier was added to nasal canal per pt request. Pt was complaining of nasal pain due to dry o2. Humidifier was approved by pt RN.

## 2017-08-28 NOTE — ED Triage Notes (Signed)
Pt from [facility]  With c/o SOB Hx of Lung cancer shingles

## 2017-08-28 NOTE — Progress Notes (Addendum)
Consult request has been received. CSW attempting to follow up at present time.  CSW spoke to CM who stated facility or pt needs to advise if Hospice was able to see pt for Hospice Admission Assessment or not at the ALF after D/C earlier today from the [facility] ED.  CSW update the [facility] ED RN CM following the pt from [facility] ED.  CSW met with the pt and the pt affirmed that Hospice at assessed him in his ALF after discharging from the [facility] ED earlier in the day. CSW informed pt pt cannot be palced at a different facility from the [facility] ED that the pt must return to his ALF to seek admission to a different facility.  Pt nodded in agreement and CSW asked pt if he understood again and pt nodded in the affirmative again. CSW asked pt if pt had any questions or if the CSW could assist the pt in any other way and the pt stated no.  CSW updated the EPD, RN CM at [facility] ED covering [facility] ED and the CN.  CN stated pt needs to be advised that pt cannot receive Hospice until pt stops receiving chemo.  Pt returned to inform pt and pt was not responding to the CSW.  RN updated who stated pt had just received pain meds and this is why pt is not responding.  CSW advised EPD pt is agreeable to returning.  Please reconsult if future social work needs arise.  CSW signing off, as social work intervention is no longer needed.  Alphonse Guild. Anea Fodera, LCSW, LCAS, CSI Clinical Social Worker Ph: [phone]
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
## 2017-08-28 NOTE — ED Triage Notes (Signed)
Pt BIB from [name] c/o pain and SOB. Hx of lung cancer, partially scabbed shingles. Pt started hospice today. They reports that he will not take his meds at the facility because "they are not the same as they are at the hospital."

## 2017-08-28 NOTE — ED Notes (Signed)
Bed: GG83 Expected date:  Expected time:  Means of arrival:  Comments: Hold for Res b

## 2017-08-28 NOTE — ED Provider Notes (Signed)
[facility] DEPT Provider Note   CSN: [id number] Arrival date & time: 09/10/2017  2056     History   Chief Complaint Chief Complaint  Patient presents with  . Shortness of Breath    HPI [name] is a 57 y.o. male.  HPI  57 year old male with a history of stage IV adenocarcinoma of the lung, COPD on chronic oxygen and recent diagnosis of shingles presents with shortness of breath and vomiting.  History is limited as the patient is currently distressed due to his nausea and vomiting and dyspnea.  He apparently has been refusing meds at the facility because he states that those meds do not work as well as the emergency departments.  EMS gave him 5 mg albuterol which she states partially helped his shortness of breath.  He is also been having abdominal pain over the last week but worse today.  Does have chest pain as well.  The shortness of breath and chest pain is a recurrent issue for him.  He has been seen frequently in the ED for the same. Is actively vomiting.  Past Medical History:  Diagnosis Date  . Abdominal pain 06/04/2016  . Adenocarcinoma of left lung, stage 4 ([place]) 05/02/2016  . Alcohol abuse   . Bronchitis due to tobacco use ([place])   . Cancer ([place])    Lung  . COPD (chronic obstructive pulmonary disease) ([place])    on home o2 2LNC   . Dehydration 06/04/2016  . Encounter for antineoplastic chemotherapy 05/02/2016  . Gastritis   . Goals of care, counseling/discussion 05/02/2016  . Hematemesis   . HTN (hypertension) 10/30/2016  . Pulmonary embolism ([place]) 06/30/2017   on CTA chest  . Recurrent upper respiratory infection (URI)   . Seizures ([place]) 05/2011   new onset  . Shingles outbreak 08/27/2017    Patient Active Problem List   Diagnosis Date Noted  . Anxiety 08/25/2017  . Steroid-induced hyperglycemia 08/19/2017  . Acute on chronic respiratory failure with hypoxia and hypercapnia ([place]) 08/13/2017  . Protein-calorie malnutrition,  severe 07/31/2017  . Thrombocytopenia ([place]) 07/29/2017  . Wheezing on exhalation 07/29/2017  . Anemia 07/24/2017  . Normocytic anemia 07/24/2017  . Chronic diastolic (congestive) heart failure ([place]) 07/24/2017  . Recurrent pleural effusion on right 07/15/2017  . Acute on chronic respiratory failure ([place]) 07/11/2017  . History of pulmonary embolism 07/11/2017  . PE (pulmonary thromboembolism) ([place]) 06/30/2017  . Acute kidney injury superimposed on CKD ([place]) 06/25/2017  . Dyspnea 06/21/2017  . Chronic combined systolic (congestive) and diastolic (congestive) heart failure ([place])   . Constipation 06/07/2017  . Pain   . Acute on chronic respiratory failure with hypoxia ([place]) 05/28/2017  . Chronic respiratory failure with hypoxia ([place]) 04/01/2017  . Leucocytosis 03/16/2017  . CKD (chronic kidney disease), stage III ([place]) 03/12/2017  . Malnutrition of moderate degree 03/10/2017  . COPD exacerbation ([place]) 03/09/2017  . Macrocytic anemia 02/07/2017  . Severe malnutrition ([place]) 01/03/2017  . DOE (dyspnea on exertion) 12/30/2016  . Nausea   . Hypervolemia   . HCAP (healthcare-associated pneumonia) 12/19/2016  . SOB (shortness of breath) 12/19/2016  . Erosive gastropathy 12/18/2016  . Gastritis 12/18/2016  . Hematemesis 12/16/2016  . Intractable nausea and vomiting 12/16/2016  . HTN (hypertension) 10/30/2016  . Chronic obstructive pulmonary disease ([place]) 09/02/2016  . COPD (chronic obstructive pulmonary disease) ([place]) 08/19/2016  . Dehydration 06/04/2016  . Abdominal pain 06/04/2016  . 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 Adenocarcinoma of left lung,  stage 4 ([place]) 05/02/2016  . Goals of care, counseling/discussion 05/02/2016  . Palliative care encounter 05/02/2016  . Tobacco abuse 05/30/2011  . Alcohol abuse 05/30/2011  . Seizure ([place]) 05/28/2011    Past Surgical History:  Procedure Laterality Date  . ESOPHAGOGASTRODUODENOSCOPY (EGD) WITH PROPOFOL N/A 12/17/2016   Procedure:  ESOPHAGOGASTRODUODENOSCOPY (EGD) WITH PROPOFOL;  Surgeon: Ladene Artist, MD;  Location: [facility] ENDOSCOPY;  Service: Endoscopy;  Laterality: N/A;  . IR FLUORO GUIDE PORT INSERTION RIGHT  05/13/2016  . IR US GUIDE VASC ACCESS RIGHT  05/13/2016  . NO PAST SURGERIES    . THORACENTESIS  07/05/2017   ultrasound guided        Home Medications    Prior to Admission medications   Medication Sig Start Date End Date Taking? Authorizing Provider  albuterol (PROVENTIL) (2.5 MG/3ML) 0.083% nebulizer solution 1 neb every 4-6 hours as needed for wheezing and shortness of breath 04/01/17   Parrett, Tammy S, NP  albuterol (PROVENTIL) (2.5 MG/3ML) 0.083% nebulizer solution Take 3 mLs (2.5 mg total) by nebulization every 6 (six) hours as needed for wheezing or shortness of breath. Patient not taking: Reported on 08/23/2017 08/17/17 09/16/17  Jani Gravel, MD  albuterol (PROVENTIL) (2.5 MG/3ML) 0.083% nebulizer solution Take 3 mLs (2.5 mg total) by nebulization every 2 (two) hours as needed for wheezing or shortness of breath. Patient not taking: Reported on 08/23/2017 08/22/17   Roxan Hockey, MD  Amino Acids-Protein Hydrolys (FEEDING SUPPLEMENT, PRO-STAT SUGAR FREE 64,) LIQD Take 30 mLs by mouth daily. 08/17/17   Jani Gravel, MD  amoxicillin-clavulanate (AUGMENTIN) 500-125 MG tablet Take 1 tablet (500 mg total) by mouth 3 (three) times daily for 7 days. 08/22/17 08/29/17  Roxan Hockey, MD  apixaban (ELIQUIS) 5 MG TABS tablet Take 5 mg by mouth 2 (two) times daily.    [provider]  chlorpheniramine-HYDROcodone (TUSSIONEX PENNKINETIC ER) 10-8 MG/5ML SUER Take 5 mLs by mouth every 12 (twelve) hours as needed for cough. 08/22/17   Roxan Hockey, MD  clonazePAM (KLONOPIN) 0.5 MG tablet Take 0.5 tablets (0.25 mg total) by mouth 2 (two) times daily as needed for anxiety. 08/22/17   Roxan Hockey, MD  dextromethorphan (DELSYM) 30 MG/5ML liquid Take 5 mLs (30 mg total) by mouth 2 (two) times daily as needed for  cough. Patient not taking: Reported on 08/23/2017 08/22/17   Roxan Hockey, MD  feeding supplement, ENSURE ENLIVE, (ENSURE ENLIVE) LIQD Take 237 mLs by mouth 3 (three) times daily between meals. 08/17/17   Jani Gravel, MD  ferrous sulfate 325 (65 FE) MG tablet Take 1 tablet (325 mg total) by mouth 2 (two) times daily with a meal. 07/12/17   Lavina Hamman, MD  Fluticasone-Umeclidin-Vilant (TRELEGY ELLIPTA) 100-62.5-25 MCG/INH AEPB Inhale 1 puff into the lungs daily. 05/31/17   Aline August, MD  folic acid (FOLVITE) 1 MG tablet Take 1 tablet (1 mg total) by mouth daily. 07/13/17   Lavina Hamman, MD  gabapentin (NEURONTIN) 100 MG capsule Take 1 capsule (100 mg total) by mouth 3 (three) times daily. 07/12/17   Lavina Hamman, MD  guaiFENesin (MUCINEX) 600 MG 12 hr tablet Take 2 tablets (1,200 mg total) by mouth 2 (two) times daily. 08/22/17   Roxan Hockey, MD  HYDROmorphone (DILAUDID) 4 MG tablet Take 1 every 6 hours for pain not helped by the oxycodone Patient taking differently: Take 4 mg by mouth See admin instructions. Take 1 every 6 hours for pain not helped by the oxycodone 08/23/17  Milton Ferguson, MD  hydrOXYzine (ATARAX/VISTARIL) 10 MG tablet Take 1 tablet (10 mg total) by mouth 3 (three) times daily as needed for anxiety. 08/22/17   Roxan Hockey, MD  insulin aspart (NOVOLOG) 100 UNIT/ML injection 0-5 Units, Subcutaneous, Daily at bedtime, First dose on Fri 08/15/17 at 2200 Correction coverage: HS scale CBG < 70: implement hypoglycemia protocol CBG 70 - 120: 0 units CBG 121 - 150: 0 units CBG 151 - 200: 0 units CBG 201 - 250: 2 units CBG 251 - 300: 3 units CBG 301 - 350: 4 units CBG 351 - 400: 5 units CBG > 400: call MD and obtain STAT lab verification 08/22/17   Roxan Hockey, MD  insulin aspart (NOVOLOG) 100 UNIT/ML injection Please inject  0-9 Units, Subcutaneous, 3 times daily with meals, First dose on Fri 08/15/17 at 1700 Correction coverage: Sensitive (thin, NPO, renal) CBG <  70: implement hypoglycemia protocol CBG 70 - 120: 0 units CBG 121 - 150: 1 unit CBG 151 - 200: 2 units CBG 201 - 250: 3 units CBG 251 - 300: 5 units CBG 301 - 350: 7 units CBG 351 - 400: 9 units CBG > 400: call MD and obtain STAT lab verification 08/22/17   Roxan Hockey, MD  loratadine (CLARITIN) 10 MG tablet Take 10 mg by mouth daily.    [provider]  metoCLOPramide (REGLAN) 5 MG tablet Take 1 tablet (5 mg total) by mouth 3 (three) times daily. 08/17/17 08/17/18  Jani Gravel, MD  metoprolol tartrate (LOPRESSOR) 50 MG tablet Take 1 tablet (50 mg total) by mouth 2 (two) times daily. 08/22/17   Roxan Hockey, MD  mirtazapine (REMERON) 15 MG tablet Take 1 tablet (15 mg total) by mouth at bedtime. 08/22/17 08/22/18  Roxan Hockey, MD  Multiple Vitamin (THERA-TABS PO) Take 1 tablet by mouth daily.    [provider]  ondansetron (ZOFRAN) 4 MG tablet Take 1 tablet (4 mg total) by mouth every 6 (six) hours as needed for nausea. 08/17/17   Jani Gravel, MD  Oxycodone HCl 10 MG TABS Take 1 tablet (10 mg total) by mouth every 4 (four) hours as needed (pain). 08/22/17   Roxan Hockey, MD  pantoprazole (PROTONIX) 40 MG tablet Take 1 tablet (40 mg total) by mouth 2 (two) times daily. 12/18/16   Eugenie Filler, MD  Phenyleph-CPM-DM-Aspirin (ALKA-SELTZER PLUS COLD & COUGH) 7.08-22-08-325 MG TBEF Take 2 tablets by mouth at bedtime as needed (cough).    [provider]  polyethylene glycol (MIRALAX / GLYCOLAX) packet Take 17 g by mouth daily. 07/29/17   Lavina Hamman, MD  potassium chloride (K-DUR) 10 MEQ tablet Take 2 tablets (20 mEq total) by mouth daily. 07/28/17   Lavina Hamman, MD  predniSONE (DELTASONE) 20 MG tablet Take 1 tablet (20 mg total) by mouth daily with breakfast. 08/23/17   Emokpae, Courage, MD  senna-docusate (SENOKOT-S) 8.6-50 MG tablet Take 2 tablets by mouth 2 (two) times daily. 07/12/17   Lavina Hamman, MD  sucralfate (CARAFATE) 1 GM/10ML suspension Take 10 mLs  (1 g total) by mouth 4 (four) times daily -  with meals and at bedtime. 07/12/17   Lavina Hamman, MD  valACYclovir (VALTREX) 500 MG tablet Take 1 tablet (500 mg total) by mouth 2 (two) times daily. Patient taking differently: Take 1,000 mg by mouth 3 (three) times daily.  08/26/17   Curt Bears, MD    Family History Family History  Problem Relation Age of Onset  .  Cancer Father   . Diabetes Mellitus II Mother     Social History Social History   Tobacco Use  . Smoking status: Current Some Day Smoker    Packs/day: 0.10    Years: 30.00    Pack years: 3.00    Types: Cigarettes  . Smokeless tobacco: Never Used  . Tobacco comment: 1 cig a week  Substance Use Topics  . Alcohol use: No    Frequency: Never  . Drug use: No     Allergies   Patient has no known allergies.   Review of Systems Review of Systems  Constitutional: Negative for fever.  Respiratory: Positive for cough and shortness of breath.   Cardiovascular: Positive for chest pain.  Gastrointestinal: Positive for abdominal pain and vomiting.  All other systems reviewed and are negative.    Physical Exam Updated Vital Signs BP (!) 166/99   Pulse (!) 106   Temp 97.7 F (36.5 C) (Oral)   Resp (!) 22   Ht 5\' 11"  (1.803 m)   Wt 52.2 kg   SpO2 100%   BMI 16.04 kg/m   Physical Exam  Constitutional: He is oriented to person, place, and time. He appears well-developed and well-nourished. He appears distressed.  vomiting  HENT:  Head: Normocephalic and atraumatic.  Right Ear: External ear normal.  Left Ear: External ear normal.  Nose: Nose normal.  Eyes: Right eye exhibits no discharge. Left eye exhibits no discharge.  Neck: Neck supple.  Cardiovascular: Regular rhythm and normal heart sounds. Tachycardia present.  Pulmonary/Chest: Accessory muscle usage present. Tachypnea noted. He has decreased breath sounds (diffuse, decreased).  Abdominal: Soft. There is generalized tenderness.  Musculoskeletal: He  exhibits no edema.  Neurological: He is alert and oriented to person, place, and time.  Skin: Skin is warm and dry. Rash noted.  Shingles to right flank  Nursing note and vitals reviewed.    ED Treatments / Results  Labs (all labs ordered are listed, but only abnormal results are displayed) Labs Reviewed  COMPREHENSIVE METABOLIC PANEL - Abnormal; Notable for the following components:      Result Value   Chloride 97 (*)    Glucose, Bld 140 (*)    Creatinine, Ser 1.53 (*)    Albumin 3.2 (*)    GFR calc non Af Amer 48 (*)    GFR calc Af Amer 56 (*)    All other components within normal limits  CBC WITH DIFFERENTIAL/PLATELET - Abnormal; Notable for the following components:   WBC 13.8 (*)    RBC 3.63 (*)    Hemoglobin 11.5 (*)    HCT 37.1 (*)    MCV 102.2 (*)    RDW 18.1 (*)    Neutro Abs 11.7 (*)    All other components within normal limits  TROPONIN I - Abnormal; Notable for the following components:   Troponin I 0.04 (*)    All other components within normal limits  BRAIN NATRIURETIC PEPTIDE - Abnormal; Notable for the following components:   B Natriuretic Peptide 121.7 (*)    All other components within normal limits  URINALYSIS, ROUTINE W REFLEX MICROSCOPIC - Abnormal; Notable for the following components:   Protein, ur 100 (*)    Bacteria, UA RARE (*)    All other components within normal limits  LIPASE, BLOOD    EKG None  Radiology Dg Chest 2 View  Result Date: 08/27/2017 CLINICAL DATA:  57 year old male with a history back pain EXAM: CHEST - 2 VIEW COMPARISON:  08/24/2017, 08/23/2017, CT 08/23/2017 FINDINGS: Cardiomediastinal silhouette likely unchanged in size and contour with the right heart border partially obscured by overlying lung/pleural disease. Persistent opacity at the right lung base obscuring the right hemidiaphragm and the right heart border. Thickening of the right minor fissure. Mixed interstitial and airspace opacities of the right lung. Meniscus on  the lateral view. Minimal blunting at the left costophrenic angle. Chronic interstitial opacities on the left. Port catheter from right IJ approach is unchanged. IMPRESSION: Unchanged right-sided pleural effusion with associated atelectasis/consolidation. Right IJ port catheter. Electronically Signed   By: Corrie Mckusick D.O.   On: 08/27/2017 19:19   Dg Chest Portable 1 View  Result Date: 09/12/2017 CLINICAL DATA:  Shortness of breath.  History of lung carcinoma EXAM: PORTABLE CHEST 1 VIEW COMPARISON:  August 27, 2017 FINDINGS: Port-A-Cath tip is at the cavoatrial junction. No pneumothorax. There is a right pleural effusion with consolidation in the right base. There is a smaller left pleural effusion with left base atelectasis. There is asymmetric interstitial edema on the right. Heart size and pulmonary vascularity are normal. No adenopathy is appreciable. No bone lesions. IMPRESSION: Persistent right pleural effusion with right base consolidation. Interstitial edema on the right potentially could represent lymphangitic spread of tumor. On the left, there is mild left base atelectasis with small left pleural effusion. Heart size is within normal limits. No adenopathy is appreciable. Overall no appreciable change from 1 day prior. Electronically Signed   By: Lowella Grip III M.D.   On: 09/13/2017 21:54    Procedures Procedures (including critical care time)  Medications Ordered in ED Medications  iopamidol (ISOVUE-300) 61 % injection (has no administration in time range)  methylPREDNISolone sodium succinate (SOLU-MEDROL) 125 mg/2 mL injection 125 mg (125 mg Intravenous Given 09/10/2017 2201)  HYDROmorphone (DILAUDID) injection 1 mg (1 mg Intravenous Given 09/17/2017 2201)  ondansetron (ZOFRAN) injection 4 mg (4 mg Intravenous Given 09/11/2017 2201)  albuterol (PROVENTIL,VENTOLIN) solution continuous neb (15 mg/hr Nebulization Given 08/21/2017 2145)  ipratropium (ATROVENT) nebulizer solution 0.5 mg (0.5 mg  Nebulization Given 09/07/2017 2145)  magnesium sulfate IVPB 2 g 50 mL (0 g Intravenous Stopped 08/23/2017 2232)  promethazine (PHENERGAN) injection 25 mg (25 mg Intravenous Given 09/01/2017 2246)  iopamidol (ISOVUE-300) 61 % injection 100 mL (80 mLs Intravenous Contrast Given 09/05/2017 2321)     Initial Impression / Assessment and Plan / ED Course  I have reviewed the triage vital signs and the nursing notes.  Pertinent labs & imaging results that were available during my care of the patient were reviewed by me and considered in my medical decision making (see chart for details).     Chest x-ray does not show any new findings.  He has significantly decreased breath sounds diffusely and is having increased work of breathing.  I had considered BiPAP although he is not quite that bad and he is currently vomiting.  He is feeling better with continuous albuterol treatment and he was also given steroids and magnesium.  Stop vomiting after the IV Phenergan.  He is having abdominal pain which seems to be new for him.  Hospice is come to evaluate him but he is not officially in hospice at this time.  Thus he will continue to be treated and will need admission for supportive care.  Dr. Alcario Drought to admit.  CT scan results pending for his abdomen.  Final Clinical Impressions(s) / ED Diagnoses   Final diagnoses:  COPD exacerbation [facility])    ED Discharge Orders  None       Sherwood Gambler, MD 08/29/17 [phone]

## 2017-08-28 NOTE — ED Notes (Signed)
Pt refusing to change into a gown.

## 2017-08-28 NOTE — Care Management Note (Addendum)
Case Management Note  Pt was seen by [facility] liaison today and pt agrees to be at [name] at 2:30 for the AV nurse to see him to start hospice level care.  Hospice team is aware of continuing discussion with pt of them assisting him in transitioning from LTC ALF to LTC SNF for better respiratory management/support.  Dr. Alvino Chapel updated.  No further CM needs noted at this time.  Desirae Mancusi, Benjaman Lobe, RN 08/27/2017, 11:40 AM

## 2017-08-28 NOTE — ED Notes (Signed)
Hospice nurses at bedside

## 2017-08-28 NOTE — Discharge Instructions (Addendum)
Labs and x-ray here without any significant findings.  Oxygen saturations have stabilized out.  Continue your current medications continue your follow-up with your doctors.  Return for any new or worse symptoms.

## 2017-08-28 NOTE — ED Notes (Signed)
Pt returned from CT. Pt resting comfortably on stretcher

## 2017-08-28 NOTE — ED Provider Notes (Signed)
[facility] DEPT Provider Note   CSN: [id number] Arrival date & time: 08/27/17  1411     History   Chief Complaint Chief Complaint  Patient presents with  . Back Pain  . Herpes Zoster    HPI [name] is a 57 y.o. male.  Patient brought in by EMS from home.  Patient supposedly under hospice care.  Patient is also DNR.  Patient has a history of lung cancer.  Was brought in for increasing generalized back thoracic chest pain.  Patient has active shingles on the right lateral chest area.  Patient's had persistent shortness of breath secondary to both lung cancer and COPD.  Patient normally on 3 L of oxygen at all times.  Patient also has a history of diabetes.  Patient has been seen frequently for the same complaint.  Patient frequently seen for shortness of breath secondary to the COPD.  But recently due to the shingles patient is now been seen 4 times counting today's visit.  3 times prior.  Patient seen twice on August 3 and once on August 4.  Patient's most recent admission was July 29 for shortness of breath.  That was for acute respiratory failure with hypoxia.  Patient states she is just having persistent shortness of breath and cannot handle the pain from the shingles.     Past Medical History:  Diagnosis Date  . Abdominal pain 06/04/2016  . Adenocarcinoma of left lung, stage 4 ([place]) 05/02/2016  . Alcohol abuse   . Bronchitis due to tobacco use ([place])   . Cancer ([place])    Lung  . COPD (chronic obstructive pulmonary disease) ([place])    on home o2 2LNC   . Dehydration 06/04/2016  . Encounter for antineoplastic chemotherapy 05/02/2016  . Gastritis   . Goals of care, counseling/discussion 05/02/2016  . Hematemesis   . HTN (hypertension) 10/30/2016  . Pulmonary embolism ([place]) 06/30/2017   on CTA chest  . Recurrent upper respiratory infection (URI)   . Seizures ([place]) 05/2011   new onset  . Shingles outbreak 08/27/2017    Patient Active  Problem List   Diagnosis Date Noted  . Anxiety 08/25/2017  . Steroid-induced hyperglycemia 08/19/2017  . Acute on chronic respiratory failure with hypoxia and hypercapnia ([place]) 08/13/2017  . Protein-calorie malnutrition, severe 07/31/2017  . Thrombocytopenia ([place]) 07/29/2017  . Wheezing on exhalation 07/29/2017  . Anemia 07/24/2017  . Normocytic anemia 07/24/2017  . Chronic diastolic (congestive) heart failure ([place]) 07/24/2017  . Recurrent pleural effusion on right 07/15/2017  . Acute on chronic respiratory failure ([place]) 07/11/2017  . History of pulmonary embolism 07/11/2017  . PE (pulmonary thromboembolism) ([place]) 06/30/2017  . Acute kidney injury superimposed on CKD ([place]) 06/25/2017  . Dyspnea 06/21/2017  . Chronic combined systolic (congestive) and diastolic (congestive) heart failure ([place])   . Constipation 06/07/2017  . Pain   . Acute on chronic respiratory failure with hypoxia ([place]) 05/28/2017  . Chronic respiratory failure with hypoxia ([place]) 04/01/2017  . Leucocytosis 03/16/2017  . CKD (chronic kidney disease), stage III ([place]) 03/12/2017  . Malnutrition of moderate degree 03/10/2017  . COPD exacerbation ([place]) 03/09/2017  . Macrocytic anemia 02/07/2017  . Severe malnutrition ([place]) 01/03/2017  . DOE (dyspnea on exertion) 12/30/2016  . Nausea   . Hypervolemia   . HCAP (healthcare-associated pneumonia) 12/19/2016  . SOB (shortness of breath) 12/19/2016  . Erosive gastropathy 12/18/2016  . Gastritis 12/18/2016  . Hematemesis 12/16/2016  . Intractable nausea and vomiting 12/16/2016  .  HTN (hypertension) 10/30/2016  . Chronic obstructive pulmonary disease ([place]) 09/02/2016  . COPD (chronic obstructive pulmonary disease) ([place]) 08/19/2016  . Dehydration 06/04/2016  . Abdominal pain 06/04/2016  . 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 Adenocarcinoma of left lung, stage 4 ([place]) 05/02/2016  . Goals of care, counseling/discussion 05/02/2016  . Palliative care encounter 05/02/2016    . Tobacco abuse 05/30/2011  . Alcohol abuse 05/30/2011  . Seizure ([place]) 05/28/2011    Past Surgical History:  Procedure Laterality Date  . ESOPHAGOGASTRODUODENOSCOPY (EGD) WITH PROPOFOL N/A 12/17/2016   Procedure: ESOPHAGOGASTRODUODENOSCOPY (EGD) WITH PROPOFOL;  Surgeon: Ladene Artist, MD;  Location: [facility] ENDOSCOPY;  Service: Endoscopy;  Laterality: N/A;  . IR FLUORO GUIDE PORT INSERTION RIGHT  05/13/2016  . IR US GUIDE VASC ACCESS RIGHT  05/13/2016  . NO PAST SURGERIES    . THORACENTESIS  07/05/2017   ultrasound guided        Home Medications    Prior to Admission medications   Medication Sig Start Date End Date Taking? Authorizing Provider  albuterol (PROVENTIL) (2.5 MG/3ML) 0.083% nebulizer solution 1 neb every 4-6 hours as needed for wheezing and shortness of breath 04/01/17  Yes Parrett, Tammy S, NP  Amino Acids-Protein Hydrolys (FEEDING SUPPLEMENT, PRO-STAT SUGAR FREE 64,) LIQD Take 30 mLs by mouth daily. 08/17/17  Yes Jani Gravel, MD  amoxicillin-clavulanate (AUGMENTIN) 500-125 MG tablet Take 1 tablet (500 mg total) by mouth 3 (three) times daily for 7 days. 08/22/17 08/29/17 Yes Emokpae, Courage, MD  apixaban (ELIQUIS) 5 MG TABS tablet Take 5 mg by mouth 2 (two) times daily.   Yes [provider]  chlorpheniramine-HYDROcodone (TUSSIONEX PENNKINETIC ER) 10-8 MG/5ML SUER Take 5 mLs by mouth every 12 (twelve) hours as needed for cough. 08/22/17  Yes Emokpae, Courage, MD  clonazePAM (KLONOPIN) 0.5 MG tablet Take 0.5 tablets (0.25 mg total) by mouth 2 (two) times daily as needed for anxiety. 08/22/17  Yes Emokpae, Courage, MD  feeding supplement, ENSURE ENLIVE, (ENSURE ENLIVE) LIQD Take 237 mLs by mouth 3 (three) times daily between meals. 08/17/17  Yes Jani Gravel, MD  ferrous sulfate 325 (65 FE) MG tablet Take 1 tablet (325 mg total) by mouth 2 (two) times daily with a meal. 07/12/17  Yes Lavina Hamman, MD  Fluticasone-Umeclidin-Vilant (TRELEGY ELLIPTA) 100-62.5-25 MCG/INH AEPB  Inhale 1 puff into the lungs daily. 05/31/17  Yes Aline August, MD  folic acid (FOLVITE) 1 MG tablet Take 1 tablet (1 mg total) by mouth daily. 07/13/17  Yes Lavina Hamman, MD  gabapentin (NEURONTIN) 100 MG capsule Take 1 capsule (100 mg total) by mouth 3 (three) times daily. 07/12/17  Yes Lavina Hamman, MD  guaiFENesin (MUCINEX) 600 MG 12 hr tablet Take 2 tablets (1,200 mg total) by mouth 2 (two) times daily. 08/22/17  Yes Emokpae, Courage, MD  HYDROmorphone (DILAUDID) 4 MG tablet Take 1 every 6 hours for pain not helped by the oxycodone Patient taking differently: Take 4 mg by mouth See admin instructions. Take 1 every 6 hours for pain not helped by the oxycodone 08/23/17  Yes Milton Ferguson, MD  hydrOXYzine (ATARAX/VISTARIL) 10 MG tablet Take 1 tablet (10 mg total) by mouth 3 (three) times daily as needed for anxiety. 08/22/17  Yes Roxan Hockey, MD  insulin aspart (NOVOLOG) 100 UNIT/ML injection 0-5 Units, Subcutaneous, Daily at bedtime, First dose on Fri 08/15/17 at 2200 Correction coverage: HS scale CBG < 70: implement hypoglycemia protocol CBG 70 - 120: 0 units CBG 121 - 150: 0  units CBG 151 - 200: 0 units CBG 201 - 250: 2 units CBG 251 - 300: 3 units CBG 301 - 350: 4 units CBG 351 - 400: 5 units CBG > 400: call MD and obtain STAT lab verification 08/22/17  Yes Emokpae, Courage, MD  insulin aspart (NOVOLOG) 100 UNIT/ML injection Please inject  0-9 Units, Subcutaneous, 3 times daily with meals, First dose on Fri 08/15/17 at 1700 Correction coverage: Sensitive (thin, NPO, renal) CBG < 70: implement hypoglycemia protocol CBG 70 - 120: 0 units CBG 121 - 150: 1 unit CBG 151 - 200: 2 units CBG 201 - 250: 3 units CBG 251 - 300: 5 units CBG 301 - 350: 7 units CBG 351 - 400: 9 units CBG > 400: call MD and obtain STAT lab verification 08/22/17  Yes Emokpae, Courage, MD  loratadine (CLARITIN) 10 MG tablet Take 10 mg by mouth daily.   Yes [provider]  metoCLOPramide (REGLAN) 5 MG  tablet Take 1 tablet (5 mg total) by mouth 3 (three) times daily. 08/17/17 08/17/18 Yes Jani Gravel, MD  metoprolol tartrate (LOPRESSOR) 50 MG tablet Take 1 tablet (50 mg total) by mouth 2 (two) times daily. 08/22/17  Yes Emokpae, Courage, MD  mirtazapine (REMERON) 15 MG tablet Take 1 tablet (15 mg total) by mouth at bedtime. 08/22/17 08/22/18 Yes Emokpae, Courage, MD  Multiple Vitamin (THERA-TABS PO) Take 1 tablet by mouth daily.   Yes [provider]  ondansetron (ZOFRAN) 4 MG tablet Take 1 tablet (4 mg total) by mouth every 6 (six) hours as needed for nausea. 08/17/17  Yes Jani Gravel, MD  Oxycodone HCl 10 MG TABS Take 1 tablet (10 mg total) by mouth every 4 (four) hours as needed (pain). 08/22/17  Yes Emokpae, Courage, MD  pantoprazole (PROTONIX) 40 MG tablet Take 1 tablet (40 mg total) by mouth 2 (two) times daily. 12/18/16  Yes Eugenie Filler, MD  Phenyleph-CPM-DM-Aspirin (ALKA-SELTZER PLUS COLD & COUGH) 7.08-22-08-325 MG TBEF Take 2 tablets by mouth at bedtime as needed (cough).   Yes [provider]  polyethylene glycol (MIRALAX / GLYCOLAX) packet Take 17 g by mouth daily. 07/29/17  Yes Lavina Hamman, MD  potassium chloride (K-DUR) 10 MEQ tablet Take 2 tablets (20 mEq total) by mouth daily. 07/28/17  Yes Lavina Hamman, MD  predniSONE (DELTASONE) 20 MG tablet Take 1 tablet (20 mg total) by mouth daily with breakfast. 08/23/17  Yes Emokpae, Courage, MD  senna-docusate (SENOKOT-S) 8.6-50 MG tablet Take 2 tablets by mouth 2 (two) times daily. 07/12/17  Yes Lavina Hamman, MD  sucralfate (CARAFATE) 1 GM/10ML suspension Take 10 mLs (1 g total) by mouth 4 (four) times daily -  with meals and at bedtime. 07/12/17  Yes Lavina Hamman, MD  valACYclovir (VALTREX) 500 MG tablet Take 1 tablet (500 mg total) by mouth 2 (two) times daily. Patient taking differently: Take 1,000 mg by mouth 3 (three) times daily.  08/26/17  Yes Curt Bears, MD  albuterol (PROVENTIL) (2.5 MG/3ML) 0.083% nebulizer  solution Take 3 mLs (2.5 mg total) by nebulization every 6 (six) hours as needed for wheezing or shortness of breath. Patient not taking: Reported on 08/23/2017 08/17/17 09/16/17  Jani Gravel, MD  albuterol (PROVENTIL) (2.5 MG/3ML) 0.083% nebulizer solution Take 3 mLs (2.5 mg total) by nebulization every 2 (two) hours as needed for wheezing or shortness of breath. Patient not taking: Reported on 08/23/2017 08/22/17   Roxan Hockey, MD  dextromethorphan (DELSYM) 30 MG/5ML liquid Take  5 mLs (30 mg total) by mouth 2 (two) times daily as needed for cough. Patient not taking: Reported on 08/23/2017 08/22/17   Roxan Hockey, MD    Family History Family History  Problem Relation Age of Onset  . Cancer Father   . Diabetes Mellitus II Mother     Social History Social History   Tobacco Use  . Smoking status: Current Some Day Smoker    Packs/day: 0.10    Years: 30.00    Pack years: 3.00    Types: Cigarettes  . Smokeless tobacco: Never Used  . Tobacco comment: 1 cig a week  Substance Use Topics  . Alcohol use: No    Frequency: Never  . Drug use: No     Allergies   Patient has no known allergies.   Review of Systems Review of Systems  Constitutional: Negative for fever.  HENT: Negative for congestion.   Eyes: Negative for visual disturbance.  Respiratory: Positive for cough, shortness of breath and wheezing.   Cardiovascular: Positive for chest pain.  Gastrointestinal: Negative for abdominal pain.  Genitourinary: Negative for hematuria.  Musculoskeletal: Positive for back pain.  Skin: Negative for rash.  Allergic/Immunologic: Positive for immunocompromised state.  Neurological: Negative for syncope.  Psychiatric/Behavioral: Negative for confusion.     Physical Exam Updated Vital Signs BP (!) 156/73 Comment: 4L O2  Pulse (!) 105 Comment: 4L O2  Resp 16 Comment: 4L O2  SpO2 90% Comment: 4L O2  Physical Exam  Constitutional: He is oriented to person, place, and time. He appears  well-developed. No distress.  HENT:  Head: Normocephalic and atraumatic.  Mucous membranes dry.  Eyes: Pupils are equal, round, and reactive to light. Conjunctivae and EOM are normal.  Neck: Neck supple.  Cardiovascular:  Slightly tachycardic  Pulmonary/Chest: Effort normal. He has wheezes.  Also at the T6 level on the right side patient with vesicles consistent with shingles.  No open vesicles.  Abdominal: Soft. Bowel sounds are normal. There is no tenderness.  Musculoskeletal: Normal range of motion. He exhibits no edema.  Neurological: He is alert and oriented to person, place, and time. No cranial nerve deficit or sensory deficit. He exhibits normal muscle tone. Coordination normal.  Nursing note and vitals reviewed.    ED Treatments / Results  Labs (all labs ordered are listed, but only abnormal results are displayed) Labs Reviewed  COMPREHENSIVE METABOLIC PANEL - Abnormal; Notable for the following components:      Result Value   CO2 38 (*)    Creatinine, Ser 1.50 (*)    Total Protein 6.3 (*)    Albumin 3.1 (*)    GFR calc non Af Amer 50 (*)    GFR calc Af Amer 58 (*)    All other components within normal limits  CBC WITH DIFFERENTIAL/PLATELET - Abnormal; Notable for the following components:   WBC 15.1 (*)    RBC 3.32 (*)    Hemoglobin 10.8 (*)    HCT 33.1 (*)    RDW 18.6 (*)    Neutro Abs 13.2 (*)    All other components within normal limits    EKG None  Radiology Dg Chest 2 View  Result Date: 08/27/2017 CLINICAL DATA:  57 year old male with a history back pain EXAM: CHEST - 2 VIEW COMPARISON:  08/24/2017, 08/23/2017, CT 08/23/2017 FINDINGS: Cardiomediastinal silhouette likely unchanged in size and contour with the right heart border partially obscured by overlying lung/pleural disease. Persistent opacity at the right lung base obscuring the right  hemidiaphragm and the right heart border. Thickening of the right minor fissure. Mixed interstitial and airspace  opacities of the right lung. Meniscus on the lateral view. Minimal blunting at the left costophrenic angle. Chronic interstitial opacities on the left. Port catheter from right IJ approach is unchanged. IMPRESSION: Unchanged right-sided pleural effusion with associated atelectasis/consolidation. Right IJ port catheter. Electronically Signed   By: Corrie Mckusick D.O.   On: 08/27/2017 19:19    Procedures Procedures (including critical care time)  Medications Ordered in ED Medications  0.9 %  sodium chloride infusion ( Intravenous New Bag/Given 08/27/17 2054)  ipratropium-albuterol (DUONEB) 0.5-2.5 (3) MG/3ML nebulizer solution 3 mL (3 mLs Nebulization Not Given 08/27/17 1902)  sodium chloride 0.9 % bolus 500 mL (500 mLs Intravenous New Bag/Given 08/27/17 1757)  ondansetron (ZOFRAN) injection 4 mg (4 mg Intravenous Given 08/27/17 1758)  HYDROmorphone (DILAUDID) injection 1 mg (1 mg Intravenous Given 08/27/17 1758)  ondansetron (ZOFRAN) injection 4 mg (4 mg Intravenous Given 08/27/17 2050)  HYDROmorphone (DILAUDID) injection 1 mg (1 mg Intravenous Given 08/27/17 2050)  ipratropium-albuterol (DUONEB) 0.5-2.5 (3) MG/3ML nebulizer solution 3 mL (3 mLs Nebulization Given 08/27/17 2214)     Initial Impression / Assessment and Plan / ED Course  I have reviewed the triage vital signs and the nursing notes.  Pertinent labs & imaging results that were available during my care of the patient were reviewed by me and considered in my medical decision making (see chart for details).    Patient's chest x-ray without any significant changes.  Patient responded well to albuterol Atrovent nebulizers.  When he felt short of breath.  Patient on 3 to 4 L of oxygen which she is able to use at home oxygen sats are above 90%.  There was some brief periods where he would desat but the nebulizers were clear that.  Patient's shingles without any evidence of secondary infection.  Patient observed for a long period of time to make sure  that the breathing situation would stabilize out.  Responded well just to breathing treatments.  Patient stable for discharge home.  No reason for admission.  At the time of discharge patient was not tachycardic anymore.   Final Clinical Impressions(s) / ED Diagnoses   Final diagnoses:  Chronic obstructive pulmonary disease with acute exacerbation [facility])    ED Discharge Orders    None       Fredia Sorrow, MD 08/30/17 [phone]

## 2017-08-28 NOTE — Telephone Encounter (Signed)
Phone calls to [employer] and Trixie Dredge ([place]) this AM with attempts to coordinate AV visit from Hospice nurse to patient who is currently in the ER.  Also spoke to [employer] at [facility] referral center in reference to same.  Patient can only have Hospice admission evaluation at his place of residence. FU Palliative visits have been attempted but patient was either hospitalized or in the ER at those times.  It is difficult to plan a visit at [facility] due to multiple recurrent trips to the ER.  Chart review notes Hospice referral recommendations.  Facility will be notified of attempt for Hospice admission rather than Palliative care continuing FU due to decline of health.

## 2017-08-28 NOTE — Progress Notes (Signed)
[facility] RN note.  Notified by Dewain Penning of patient/family request for [facility] services at home after discharge. Chart and patient information under review by [facility] physician. Hospice eligibility pending at this time.  Writer spoke with patient at bedside to initiate education related to hospice philosophy, services and team approach to care. Patient verbalized understanding of information given. Per discussion, plan is for discharge to home by [facility] today. Assessment visit scheduled for 14:30 today and patient is agreeable to this visit.   Please send signed and completed DNR form home with patient if applicable. Patient will need prescriptions for discharge comfort medications.  [facility] aware of the above. Please notify [facility] when patient is ready to leave the unit at discharge. Call [phone]. [facility] information and contact numbers given to patient  at time of visit. Above information shared with Levada Dy, [facility].  Please call with any hospice related questions.  Thank you for this referral.  Farrel Gordon, RN, [facility] Liaison [phone]  [facility] hospital liaisons are on [place]

## 2017-08-28 NOTE — ED Notes (Signed)
Bed: RESB Expected date:  Expected time:  Means of arrival:  Comments: Resp distress

## 2017-08-28 NOTE — ED Notes (Signed)
Patient out of ED via PTAR in no distress.

## 2017-08-28 NOTE — Discharge Instructions (Addendum)
The hospice nurse will be seeing you at 2:30 today.

## 2017-08-28 NOTE — ED Notes (Signed)
Bed: TX77 Expected date:  Expected time:  Means of arrival:  Comments: EMS 57 yo male from facility-[facility]-lung cancer/Hospice patient 91% 4l/[place]-shingles

## 2017-08-28 NOTE — ED Provider Notes (Signed)
[facility] DEPT Provider Note   CSN: [id number] Arrival date & time: 09/18/2017  0531     History   Chief Complaint Chief Complaint  Patient presents with  . Shortness of Breath    HPI [name] is a 57 y.o. male.  HPI Patient presents with several complaints.  Shortness of breath.  States he thinks he may have bad oxygen at home.  Is chronically on 2 to 3 L.  Better on the EMS oxygen.  Also pain in his legs.  Has had issues with this in the past.  Also pain in his abdomen.  Previous history of this also.  Also history of shingles on his back.  States this is hurting.  Stage IV lung cancer.  Reviewed note by [name] 2 days ago and states he is going to be on hospice.  Seen in ER yesterday and reportedly hospice was contacted. Past Medical History:  Diagnosis Date  . Abdominal pain 06/04/2016  . Adenocarcinoma of left lung, stage 4 ([place]) 05/02/2016  . Alcohol abuse   . Bronchitis due to tobacco use ([name])   . Cancer ([place])    Lung  . COPD (chronic obstructive pulmonary disease) ([place])    on home o2 2LNC   . Dehydration 06/04/2016  . Encounter for antineoplastic chemotherapy 05/02/2016  . Gastritis   . Goals of care, counseling/discussion 05/02/2016  . Hematemesis   . HTN (hypertension) 10/30/2016  . Pulmonary embolism ([place]) 06/30/2017   on CTA chest  . Recurrent upper respiratory infection (URI)   . Seizures ([place]) 05/2011   new onset  . Shingles outbreak 08/27/2017    Patient Active Problem List   Diagnosis Date Noted  . Anxiety 08/25/2017  . Steroid-induced hyperglycemia 08/19/2017  . Acute on chronic respiratory failure with hypoxia and hypercapnia ([place]) 08/13/2017  . Protein-calorie malnutrition, severe 07/31/2017  . Thrombocytopenia ([place]) 07/29/2017  . Wheezing on exhalation 07/29/2017  . Anemia 07/24/2017  . Normocytic anemia 07/24/2017  . Chronic diastolic (congestive) heart failure ([place]) 07/24/2017  . Recurrent pleural  effusion on right 07/15/2017  . Acute on chronic respiratory failure ([place]) 07/11/2017  . History of pulmonary embolism 07/11/2017  . PE (pulmonary thromboembolism) ([place]) 06/30/2017  . Acute kidney injury superimposed on CKD ([place]) 06/25/2017  . Dyspnea 06/21/2017  . Chronic combined systolic (congestive) and diastolic (congestive) heart failure ([place])   . Constipation 06/07/2017  . Pain   . Acute on chronic respiratory failure with hypoxia ([place]) 05/28/2017  . Chronic respiratory failure with hypoxia ([place]) 04/01/2017  . Leucocytosis 03/16/2017  . CKD (chronic kidney disease), stage III ([place]) 03/12/2017  . Malnutrition of moderate degree 03/10/2017  . COPD exacerbation ([place]) 03/09/2017  . Macrocytic anemia 02/07/2017  . Severe malnutrition ([place]) 01/03/2017  . DOE (dyspnea on exertion) 12/30/2016  . Nausea   . Hypervolemia   . HCAP (healthcare-associated pneumonia) 12/19/2016  . SOB (shortness of breath) 12/19/2016  . Erosive gastropathy 12/18/2016  . Gastritis 12/18/2016  . Hematemesis 12/16/2016  . Intractable nausea and vomiting 12/16/2016  . HTN (hypertension) 10/30/2016  . Chronic obstructive pulmonary disease ([place]) 09/02/2016  . COPD (chronic obstructive pulmonary disease) ([place]) 08/19/2016  . Dehydration 06/04/2016  . Abdominal pain 06/04/2016  . 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 Adenocarcinoma of left lung, stage 4 ([place]) 05/02/2016  . Goals of care, counseling/discussion 05/02/2016  . Palliative care encounter 05/02/2016  . Tobacco abuse 05/30/2011  . Alcohol abuse 05/30/2011  . Seizure ([place]) 05/28/2011  Past Surgical History:  Procedure Laterality Date  . ESOPHAGOGASTRODUODENOSCOPY (EGD) WITH PROPOFOL N/A 12/17/2016   Procedure: ESOPHAGOGASTRODUODENOSCOPY (EGD) WITH PROPOFOL;  Surgeon: Ladene Artist, MD;  Location: [facility] ENDOSCOPY;  Service: Endoscopy;  Laterality: N/A;  . IR FLUORO GUIDE PORT INSERTION RIGHT  05/13/2016  . IR US GUIDE VASC ACCESS RIGHT  05/13/2016    . NO PAST SURGERIES    . THORACENTESIS  07/05/2017   ultrasound guided        Home Medications    Prior to Admission medications   Medication Sig Start Date End Date Taking? Authorizing Provider  albuterol (PROVENTIL) (2.5 MG/3ML) 0.083% nebulizer solution 1 neb every 4-6 hours as needed for wheezing and shortness of breath 04/01/17  Yes Parrett, Tammy S, NP  Amino Acids-Protein Hydrolys (FEEDING SUPPLEMENT, PRO-STAT SUGAR FREE 64,) LIQD Take 30 mLs by mouth daily. 08/17/17  Yes Jani Gravel, MD  amoxicillin-clavulanate (AUGMENTIN) 500-125 MG tablet Take 1 tablet (500 mg total) by mouth 3 (three) times daily for 7 days. 08/22/17 08/29/17 Yes Emokpae, Courage, MD  apixaban (ELIQUIS) 5 MG TABS tablet Take 5 mg by mouth 2 (two) times daily.   Yes [provider]  chlorpheniramine-HYDROcodone (TUSSIONEX PENNKINETIC ER) 10-8 MG/5ML SUER Take 5 mLs by mouth every 12 (twelve) hours as needed for cough. 08/22/17  Yes Emokpae, Courage, MD  clonazePAM (KLONOPIN) 0.5 MG tablet Take 0.5 tablets (0.25 mg total) by mouth 2 (two) times daily as needed for anxiety. 08/22/17  Yes Emokpae, Courage, MD  feeding supplement, ENSURE ENLIVE, (ENSURE ENLIVE) LIQD Take 237 mLs by mouth 3 (three) times daily between meals. 08/17/17  Yes Jani Gravel, MD  ferrous sulfate 325 (65 FE) MG tablet Take 1 tablet (325 mg total) by mouth 2 (two) times daily with a meal. 07/12/17  Yes Lavina Hamman, MD  Fluticasone-Umeclidin-Vilant (TRELEGY ELLIPTA) 100-62.5-25 MCG/INH AEPB Inhale 1 puff into the lungs daily. 05/31/17  Yes Aline August, MD  folic acid (FOLVITE) 1 MG tablet Take 1 tablet (1 mg total) by mouth daily. 07/13/17  Yes Lavina Hamman, MD  gabapentin (NEURONTIN) 100 MG capsule Take 1 capsule (100 mg total) by mouth 3 (three) times daily. 07/12/17  Yes Lavina Hamman, MD  guaiFENesin (MUCINEX) 600 MG 12 hr tablet Take 2 tablets (1,200 mg total) by mouth 2 (two) times daily. 08/22/17  Yes Emokpae, Courage, MD  HYDROmorphone  (DILAUDID) 4 MG tablet Take 1 every 6 hours for pain not helped by the oxycodone Patient taking differently: Take 4 mg by mouth See admin instructions. Take 1 every 6 hours for pain not helped by the oxycodone 08/23/17  Yes Milton Ferguson, MD  hydrOXYzine (ATARAX/VISTARIL) 10 MG tablet Take 1 tablet (10 mg total) by mouth 3 (three) times daily as needed for anxiety. 08/22/17  Yes Roxan Hockey, MD  insulin aspart (NOVOLOG) 100 UNIT/ML injection 0-5 Units, Subcutaneous, Daily at bedtime, First dose on Fri 08/15/17 at 2200 Correction coverage: HS scale CBG < 70: implement hypoglycemia protocol CBG 70 - 120: 0 units CBG 121 - 150: 0 units CBG 151 - 200: 0 units CBG 201 - 250: 2 units CBG 251 - 300: 3 units CBG 301 - 350: 4 units CBG 351 - 400: 5 units CBG > 400: call MD and obtain STAT lab verification 08/22/17  Yes Emokpae, Courage, MD  insulin aspart (NOVOLOG) 100 UNIT/ML injection Please inject  0-9 Units, Subcutaneous, 3 times daily with meals, First dose on Fri 08/15/17 at 1700 Correction coverage: Sensitive (thin,  NPO, renal) CBG < 70: implement hypoglycemia protocol CBG 70 - 120: 0 units CBG 121 - 150: 1 unit CBG 151 - 200: 2 units CBG 201 - 250: 3 units CBG 251 - 300: 5 units CBG 301 - 350: 7 units CBG 351 - 400: 9 units CBG > 400: call MD and obtain STAT lab verification 08/22/17  Yes Emokpae, Courage, MD  loratadine (CLARITIN) 10 MG tablet Take 10 mg by mouth daily.   Yes [provider]  metoCLOPramide (REGLAN) 5 MG tablet Take 1 tablet (5 mg total) by mouth 3 (three) times daily. 08/17/17 08/17/18 Yes Jani Gravel, MD  metoprolol tartrate (LOPRESSOR) 50 MG tablet Take 1 tablet (50 mg total) by mouth 2 (two) times daily. 08/22/17  Yes Emokpae, Courage, MD  mirtazapine (REMERON) 15 MG tablet Take 1 tablet (15 mg total) by mouth at bedtime. 08/22/17 08/22/18 Yes Emokpae, Courage, MD  Multiple Vitamin (THERA-TABS PO) Take 1 tablet by mouth daily.   Yes [provider]    ondansetron (ZOFRAN) 4 MG tablet Take 1 tablet (4 mg total) by mouth every 6 (six) hours as needed for nausea. 08/17/17  Yes Jani Gravel, MD  Oxycodone HCl 10 MG TABS Take 1 tablet (10 mg total) by mouth every 4 (four) hours as needed (pain). 08/22/17  Yes Emokpae, Courage, MD  pantoprazole (PROTONIX) 40 MG tablet Take 1 tablet (40 mg total) by mouth 2 (two) times daily. 12/18/16  Yes Eugenie Filler, MD  Phenyleph-CPM-DM-Aspirin (ALKA-SELTZER PLUS COLD & COUGH) 7.08-22-08-325 MG TBEF Take 2 tablets by mouth at bedtime as needed (cough).   Yes [provider]  polyethylene glycol (MIRALAX / GLYCOLAX) packet Take 17 g by mouth daily. 07/29/17  Yes Lavina Hamman, MD  potassium chloride (K-DUR) 10 MEQ tablet Take 2 tablets (20 mEq total) by mouth daily. 07/28/17  Yes Lavina Hamman, MD  predniSONE (DELTASONE) 20 MG tablet Take 1 tablet (20 mg total) by mouth daily with breakfast. 08/23/17  Yes Emokpae, Courage, MD  senna-docusate (SENOKOT-S) 8.6-50 MG tablet Take 2 tablets by mouth 2 (two) times daily. 07/12/17  Yes Lavina Hamman, MD  sucralfate (CARAFATE) 1 GM/10ML suspension Take 10 mLs (1 g total) by mouth 4 (four) times daily -  with meals and at bedtime. 07/12/17  Yes Lavina Hamman, MD  valACYclovir (VALTREX) 500 MG tablet Take 1 tablet (500 mg total) by mouth 2 (two) times daily. Patient taking differently: Take 1,000 mg by mouth 3 (three) times daily.  08/26/17  Yes Curt Bears, MD  albuterol (PROVENTIL) (2.5 MG/3ML) 0.083% nebulizer solution Take 3 mLs (2.5 mg total) by nebulization every 6 (six) hours as needed for wheezing or shortness of breath. Patient not taking: Reported on 08/23/2017 08/17/17 09/16/17  Jani Gravel, MD  albuterol (PROVENTIL) (2.5 MG/3ML) 0.083% nebulizer solution Take 3 mLs (2.5 mg total) by nebulization every 2 (two) hours as needed for wheezing or shortness of breath. Patient not taking: Reported on 08/23/2017 08/22/17   Roxan Hockey, MD  dextromethorphan (DELSYM) 30  MG/5ML liquid Take 5 mLs (30 mg total) by mouth 2 (two) times daily as needed for cough. Patient not taking: Reported on 08/23/2017 08/22/17   Roxan Hockey, MD    Family History Family History  Problem Relation Age of Onset  . Cancer Father   . Diabetes Mellitus II Mother     Social History Social History   Tobacco Use  . Smoking status: Current Some Day Smoker    Packs/day:  0.10    Years: 30.00    Pack years: 3.00    Types: Cigarettes  . Smokeless tobacco: Never Used  . Tobacco comment: 1 cig a week  Substance Use Topics  . Alcohol use: No    Frequency: Never  . Drug use: No     Allergies   Patient has no known allergies.   Review of Systems Review of Systems  Constitutional: Positive for appetite change.  HENT: Positive for congestion.   Respiratory: Positive for cough and shortness of breath.   Gastrointestinal: Positive for abdominal pain.  Musculoskeletal: Positive for back pain.       Leg pain  Skin: Positive for rash.  Neurological: Positive for weakness.     Physical Exam Updated Vital Signs BP (!) 164/93 (BP Location: Left Arm)   Pulse 100   Temp 98.1 F (36.7 C) (Oral)   Resp 13   Ht 5\' 11"  (1.803 m)   Wt 52.2 kg   SpO2 92%   BMI 16.04 kg/m   Physical Exam  Constitutional: He appears well-developed.  HENT:  Head: Atraumatic.  Neck: Neck supple.  Cardiovascular: Regular rhythm.  Pulmonary/Chest:  Mildly harsh breath sounds on nasal cannula oxygen.  Abdominal:  Mild upper abdominal tenderness without rebound or guarding.  Musculoskeletal:       Right lower leg: He exhibits no edema.       Left lower leg: He exhibits no edema.  Neurological: He is alert.  Skin: Skin is warm. Capillary refill takes less than 2 seconds.     ED Treatments / Results  Labs (all labs ordered are listed, but only abnormal results are displayed) Labs Reviewed - No data to display  EKG None  Radiology Dg Chest 2 View  Result Date:  08/27/2017 CLINICAL DATA:  57 year old male with a history back pain EXAM: CHEST - 2 VIEW COMPARISON:  08/24/2017, 08/23/2017, CT 08/23/2017 FINDINGS: Cardiomediastinal silhouette likely unchanged in size and contour with the right heart border partially obscured by overlying lung/pleural disease. Persistent opacity at the right lung base obscuring the right hemidiaphragm and the right heart border. Thickening of the right minor fissure. Mixed interstitial and airspace opacities of the right lung. Meniscus on the lateral view. Minimal blunting at the left costophrenic angle. Chronic interstitial opacities on the left. Port catheter from right IJ approach is unchanged. IMPRESSION: Unchanged right-sided pleural effusion with associated atelectasis/consolidation. Right IJ port catheter. Electronically Signed   By: Corrie Mckusick D.O.   On: 08/27/2017 19:19    Procedures Procedures (including critical care time)  Medications Ordered in ED Medications  ipratropium-albuterol (DUONEB) 0.5-2.5 (3) MG/3ML nebulizer solution 3 mL (3 mLs Nebulization Given 09/08/2017 0915)  oxyCODONE-acetaminophen (PERCOCET/ROXICET) 5-325 MG per tablet 2 tablet (2 tablets Oral Given 09/11/2017 1110)     Initial Impression / Assessment and Plan / ED Course  I have reviewed the triage vital signs and the nursing notes.  Pertinent labs & imaging results that were available during my care of the patient were reviewed by me and considered in my medical decision making (see chart for details).    Patient with acute on chronic shortness of breath.  Seen yesterday for same.  Has had 43 ER visits the last 6 months.  In oncology note appears that the patient is transitioning to hospice.  Discussed with hospice and palliative care [facility] with her overnight line.  States that he is active care now and unsure if he is transitioning to hospice.  States they  will have the referral center contact me after 830 and that the hospice nurse that is  rounding in the hospital will come by also.   Hospice is seen patient.  Cannot be an old and hospice to the ER however.  Hospice nurse will see patient at nursing home at 230 today.  Final Clinical Impressions(s) / ED Diagnoses   Final diagnoses:  Chronic obstructive pulmonary disease, unspecified COPD type ([place])  Metastatic cancer ([place])  Followed by palliative care service    ED Discharge Orders    None       Davonna Belling, MD 09/13/2017 1139

## 2017-08-29 ENCOUNTER — Inpatient Hospital Stay (HOSPITAL_COMMUNITY)

## 2017-08-29 DIAGNOSIS — J9621 Acute and chronic respiratory failure with hypoxia: Secondary | ICD-10-CM

## 2017-08-29 DIAGNOSIS — I5033 Acute on chronic diastolic (congestive) heart failure: Secondary | ICD-10-CM | POA: Diagnosis present

## 2017-08-29 DIAGNOSIS — Z9114 Patient's other noncompliance with medication regimen: Secondary | ICD-10-CM | POA: Diagnosis not present

## 2017-08-29 DIAGNOSIS — J189 Pneumonia, unspecified organism: Secondary | ICD-10-CM | POA: Diagnosis present

## 2017-08-29 DIAGNOSIS — K2970 Gastritis, unspecified, without bleeding: Secondary | ICD-10-CM | POA: Diagnosis present

## 2017-08-29 DIAGNOSIS — B029 Zoster without complications: Secondary | ICD-10-CM | POA: Diagnosis present

## 2017-08-29 DIAGNOSIS — Z515 Encounter for palliative care: Secondary | ICD-10-CM

## 2017-08-29 DIAGNOSIS — Z66 Do not resuscitate: Secondary | ICD-10-CM | POA: Diagnosis present

## 2017-08-29 DIAGNOSIS — N183 Chronic kidney disease, stage 3 (moderate): Secondary | ICD-10-CM | POA: Diagnosis present

## 2017-08-29 DIAGNOSIS — C3492 Malignant neoplasm of unspecified part of left bronchus or lung: Secondary | ICD-10-CM | POA: Diagnosis present

## 2017-08-29 DIAGNOSIS — Y95 Nosocomial condition: Secondary | ICD-10-CM | POA: Diagnosis present

## 2017-08-29 DIAGNOSIS — C7951 Secondary malignant neoplasm of bone: Secondary | ICD-10-CM | POA: Diagnosis present

## 2017-08-29 DIAGNOSIS — Z86711 Personal history of pulmonary embolism: Secondary | ICD-10-CM | POA: Diagnosis not present

## 2017-08-29 DIAGNOSIS — J440 Chronic obstructive pulmonary disease with acute lower respiratory infection: Secondary | ICD-10-CM | POA: Diagnosis present

## 2017-08-29 DIAGNOSIS — R569 Unspecified convulsions: Secondary | ICD-10-CM | POA: Diagnosis present

## 2017-08-29 DIAGNOSIS — Z9981 Dependence on supplemental oxygen: Secondary | ICD-10-CM | POA: Diagnosis not present

## 2017-08-29 DIAGNOSIS — F419 Anxiety disorder, unspecified: Secondary | ICD-10-CM | POA: Diagnosis present

## 2017-08-29 DIAGNOSIS — F17210 Nicotine dependence, cigarettes, uncomplicated: Secondary | ICD-10-CM | POA: Diagnosis present

## 2017-08-29 DIAGNOSIS — J441 Chronic obstructive pulmonary disease with (acute) exacerbation: Secondary | ICD-10-CM | POA: Diagnosis present

## 2017-08-29 DIAGNOSIS — E1122 Type 2 diabetes mellitus with diabetic chronic kidney disease: Secondary | ICD-10-CM | POA: Diagnosis present

## 2017-08-29 DIAGNOSIS — Z86718 Personal history of other venous thrombosis and embolism: Secondary | ICD-10-CM | POA: Diagnosis not present

## 2017-08-29 DIAGNOSIS — I130 Hypertensive heart and chronic kidney disease with heart failure and stage 1 through stage 4 chronic kidney disease, or unspecified chronic kidney disease: Secondary | ICD-10-CM | POA: Diagnosis present

## 2017-08-29 DIAGNOSIS — E1165 Type 2 diabetes mellitus with hyperglycemia: Secondary | ICD-10-CM | POA: Diagnosis present

## 2017-08-29 DIAGNOSIS — Z9221 Personal history of antineoplastic chemotherapy: Secondary | ICD-10-CM | POA: Diagnosis not present

## 2017-08-29 DIAGNOSIS — T380X5A Adverse effect of glucocorticoids and synthetic analogues, initial encounter: Secondary | ICD-10-CM | POA: Diagnosis present

## 2017-08-29 LAB — BASIC METABOLIC PANEL
ANION GAP: 14 (ref 5–15)
BUN: 21 mg/dL — ABNORMAL HIGH (ref 6–20)
CO2: 33 mmol/L — AB (ref 22–32)
Calcium: 9.6 mg/dL (ref 8.9–10.3)
Chloride: 97 mmol/L — ABNORMAL LOW (ref 98–111)
Creatinine, Ser: 1.71 mg/dL — ABNORMAL HIGH (ref 0.61–1.24)
GFR calc Af Amer: 49 mL/min — ABNORMAL LOW (ref >60)
GFR, EST NON AFRICAN AMERICAN: 42 mL/min — AB (ref >60)
GLUCOSE: 191 mg/dL — AB (ref 70–99)
POTASSIUM: 4 mmol/L (ref 3.5–5.1)
Sodium: 144 mmol/L (ref 135–145)

## 2017-08-29 LAB — GLUCOSE, CAPILLARY
GLUCOSE-CAPILLARY: 136 mg/dL — AB (ref 70–99)
GLUCOSE-CAPILLARY: 191 mg/dL — AB (ref 70–99)
GLUCOSE-CAPILLARY: 119 mg/dL — AB (ref 70–99)
Glucose-Capillary: 224 mg/dL — ABNORMAL HIGH (ref 70–99)

## 2017-08-29 LAB — HIV ANTIBODY (ROUTINE TESTING W REFLEX): HIV SCREEN 4TH GENERATION: NONREACTIVE

## 2017-08-29 LAB — MRSA PCR SCREENING: MRSA by PCR: NEGATIVE

## 2017-08-29 MED ORDER — HYDROMORPHONE HCL 4 MG PO TABS
4.0000 mg | ORAL_TABLET | Freq: Four times a day (QID) | ORAL | Status: DC | PRN
  Filled 2017-08-29: qty 2

## 2017-08-29 MED ORDER — METOPROLOL TARTRATE 50 MG PO TABS
50.0000 mg | ORAL_TABLET | Freq: Two times a day (BID) | ORAL | Status: DC
  Administered 2017-08-29 (×2): 50 mg via ORAL
  Filled 2017-08-29 (×3): qty 2
  Filled 2017-08-29: qty 1

## 2017-08-29 MED ORDER — VALACYCLOVIR HCL 500 MG PO TABS
1000.0000 mg | ORAL_TABLET | Freq: Three times a day (TID) | ORAL | Status: DC
  Administered 2017-08-29 – 2017-08-30 (×2): 1000 mg via ORAL
  Filled 2017-08-29 (×8): qty 2

## 2017-08-29 MED ORDER — INSULIN ASPART 100 UNIT/ML ~~LOC~~ SOLN
0.0000 [IU] | Freq: Three times a day (TID) | SUBCUTANEOUS | Status: DC
  Administered 2017-08-29: 2 [IU] via SUBCUTANEOUS
  Administered 2017-08-29: 3 [IU] via SUBCUTANEOUS

## 2017-08-29 MED ORDER — HYDRALAZINE HCL 20 MG/ML IJ SOLN
10.0000 mg | Freq: Four times a day (QID) | INTRAMUSCULAR | Status: DC | PRN
  Administered 2017-08-29: 10 mg via INTRAVENOUS
  Filled 2017-08-29: qty 1

## 2017-08-29 MED ORDER — GABAPENTIN 100 MG PO CAPS
100.0000 mg | ORAL_CAPSULE | Freq: Three times a day (TID) | ORAL | Status: DC
  Administered 2017-08-30: 100 mg via ORAL
  Filled 2017-08-29 (×3): qty 1

## 2017-08-29 MED ORDER — FLUTICASONE-UMECLIDIN-VILANT 100-62.5-25 MCG/INH IN AEPB: 1.0000 | INHALATION_SPRAY | Freq: Every day | RESPIRATORY_TRACT | Status: DC

## 2017-08-29 MED ORDER — PANTOPRAZOLE SODIUM 40 MG PO TBEC
40.0000 mg | DELAYED_RELEASE_TABLET | Freq: Two times a day (BID) | ORAL | Status: DC
  Administered 2017-08-29 (×3): 40 mg via ORAL
  Filled 2017-08-29 (×5): qty 1

## 2017-08-29 MED ORDER — OXYCODONE HCL 5 MG PO TABS
10.0000 mg | ORAL_TABLET | ORAL | Status: DC | PRN
  Administered 2017-08-29 – 2017-08-30 (×3): 10 mg via ORAL
  Filled 2017-08-29 (×3): qty 2

## 2017-08-29 MED ORDER — SODIUM CHLORIDE 0.9 % IV SOLN
1.0000 g | Freq: Two times a day (BID) | INTRAVENOUS | Status: DC
  Administered 2017-08-29 – 2017-08-30 (×5): 1 g via INTRAVENOUS
  Filled 2017-08-29 (×6): qty 1

## 2017-08-29 MED ORDER — CLONAZEPAM 0.5 MG PO TABS
0.2500 mg | ORAL_TABLET | Freq: Two times a day (BID) | ORAL | Status: DC | PRN
  Administered 2017-08-29 – 2017-08-30 (×3): 0.25 mg via ORAL
  Filled 2017-08-29 (×3): qty 1

## 2017-08-29 MED ORDER — APIXABAN 5 MG PO TABS
5.0000 mg | ORAL_TABLET | Freq: Two times a day (BID) | ORAL | Status: DC
  Administered 2017-08-29 (×2): 5 mg via ORAL
  Filled 2017-08-29 (×4): qty 1

## 2017-08-29 MED ORDER — ARFORMOTEROL TARTRATE 15 MCG/2ML IN NEBU
15.0000 ug | INHALATION_SOLUTION | Freq: Two times a day (BID) | RESPIRATORY_TRACT | Status: DC
Start: 2017-08-29 — End: 2017-08-31
  Administered 2017-08-29 – 2017-08-30 (×4): 15 ug via RESPIRATORY_TRACT
  Filled 2017-08-29 (×5): qty 2

## 2017-08-29 MED ORDER — HALOPERIDOL LACTATE 5 MG/ML IJ SOLN: 5.0000 mg | Freq: Four times a day (QID) | INTRAMUSCULAR | Status: DC | PRN

## 2017-08-29 MED ORDER — ENSURE ENLIVE PO LIQD: 237.0000 mL | Freq: Three times a day (TID) | ORAL | Status: DC

## 2017-08-29 MED ORDER — LORATADINE 10 MG PO TABS
10.0000 mg | ORAL_TABLET | Freq: Every day | ORAL | Status: DC
  Filled 2017-08-29: qty 1

## 2017-08-29 MED ORDER — AMOXICILLIN-POT CLAVULANATE 500-125 MG PO TABS: 1.0000 | ORAL_TABLET | Freq: Three times a day (TID) | ORAL | Status: DC

## 2017-08-29 MED ORDER — SUCRALFATE 1 GM/10ML PO SUSP
1.0000 g | Freq: Three times a day (TID) | ORAL | Status: DC
  Filled 2017-08-29 (×3): qty 10

## 2017-08-29 MED ORDER — ALBUTEROL SULFATE (2.5 MG/3ML) 0.083% IN NEBU
2.5000 mg | INHALATION_SOLUTION | RESPIRATORY_TRACT | Status: DC | PRN
  Administered 2017-08-29 – 2017-08-31 (×5): 2.5 mg via RESPIRATORY_TRACT
  Filled 2017-08-29 (×4): qty 3

## 2017-08-29 MED ORDER — FERROUS SULFATE 325 (65 FE) MG PO TABS
325.0000 mg | ORAL_TABLET | Freq: Two times a day (BID) | ORAL | Status: DC
  Filled 2017-08-29: qty 1

## 2017-08-29 MED ORDER — MIRTAZAPINE 15 MG PO TABS
15.0000 mg | ORAL_TABLET | Freq: Every day | ORAL | Status: DC
  Administered 2017-08-29 (×2): 15 mg via ORAL
  Filled 2017-08-29: qty 1
  Filled 2017-08-29: qty 0.5
  Filled 2017-08-29: qty 1
  Filled 2017-08-29: qty 0.5
  Filled 2017-08-29: qty 1

## 2017-08-29 MED ORDER — HYDROXYZINE HCL 10 MG PO TABS
10.0000 mg | ORAL_TABLET | Freq: Three times a day (TID) | ORAL | Status: DC | PRN
  Administered 2017-08-30: 10 mg via ORAL
  Filled 2017-08-29: qty 1

## 2017-08-29 MED ORDER — SENNOSIDES-DOCUSATE SODIUM 8.6-50 MG PO TABS
2.0000 | ORAL_TABLET | Freq: Two times a day (BID) | ORAL | Status: DC
  Administered 2017-08-29: 2 via ORAL
  Filled 2017-08-29 (×2): qty 2

## 2017-08-29 MED ORDER — FOLIC ACID 1 MG PO TABS
1.0000 mg | ORAL_TABLET | Freq: Every day | ORAL | Status: DC
  Filled 2017-08-29: qty 1

## 2017-08-29 MED ORDER — ORAL CARE MOUTH RINSE: 15.0000 mL | Freq: Two times a day (BID) | OROMUCOSAL | Status: DC

## 2017-08-29 MED ORDER — INSULIN ASPART 100 UNIT/ML ~~LOC~~ SOLN
0.0000 [IU] | Freq: Every day | SUBCUTANEOUS | Status: DC
  Administered 2017-08-29: 2 [IU] via SUBCUTANEOUS

## 2017-08-29 MED ORDER — PREDNISONE 20 MG PO TABS
40.0000 mg | ORAL_TABLET | Freq: Every day | ORAL | Status: DC
  Administered 2017-08-29: 40 mg via ORAL
  Filled 2017-08-29: qty 2

## 2017-08-29 MED ORDER — ALBUTEROL SULFATE (2.5 MG/3ML) 0.083% IN NEBU: 2.5000 mg | INHALATION_SOLUTION | RESPIRATORY_TRACT | Status: DC | PRN

## 2017-08-29 MED ORDER — POTASSIUM CHLORIDE ER 10 MEQ PO TBCR
20.0000 meq | EXTENDED_RELEASE_TABLET | Freq: Every day | ORAL | Status: DC
  Filled 2017-08-29 (×3): qty 2

## 2017-08-29 MED ORDER — BUDESONIDE 0.5 MG/2ML IN SUSP
0.5000 mg | Freq: Two times a day (BID) | RESPIRATORY_TRACT | Status: DC
  Administered 2017-08-29 – 2017-08-30 (×4): 0.5 mg via RESPIRATORY_TRACT
  Filled 2017-08-29 (×5): qty 2

## 2017-08-29 MED ORDER — METOCLOPRAMIDE HCL 5 MG PO TABS
5.0000 mg | ORAL_TABLET | Freq: Three times a day (TID) | ORAL | Status: DC
Start: 2017-08-29 — End: 2017-08-31
  Administered 2017-08-29 – 2017-08-30 (×2): 5 mg via ORAL
  Filled 2017-08-29 (×4): qty 1

## 2017-08-29 MED ORDER — ALBUTEROL SULFATE (2.5 MG/3ML) 0.083% IN NEBU
2.5000 mg | INHALATION_SOLUTION | RESPIRATORY_TRACT | Status: DC
  Administered 2017-08-29 – 2017-08-30 (×8): 2.5 mg via RESPIRATORY_TRACT
  Filled 2017-08-29 (×10): qty 3

## 2017-08-29 MED ORDER — DEXTROMETHORPHAN POLISTIREX ER 30 MG/5ML PO SUER
20.0000 mg | Freq: Every evening | ORAL | Status: DC | PRN
  Filled 2017-08-29: qty 5

## 2017-08-29 MED ORDER — GUAIFENESIN ER 600 MG PO TB12
1200.0000 mg | ORAL_TABLET | Freq: Two times a day (BID) | ORAL | Status: DC
  Administered 2017-08-29: 1200 mg via ORAL
  Filled 2017-08-29 (×3): qty 2

## 2017-08-29 MED ORDER — POLYETHYLENE GLYCOL 3350 17 G PO PACK: 17.0000 g | PACK | Freq: Every day | ORAL | Status: DC

## 2017-08-29 MED ORDER — ASPIRIN EC 325 MG PO TBEC: 325.0000 mg | DELAYED_RELEASE_TABLET | Freq: Every evening | ORAL | Status: DC | PRN

## 2017-08-29 MED ORDER — HYDROCOD POLST-CPM POLST ER 10-8 MG/5ML PO SUER
5.0000 mL | Freq: Two times a day (BID) | ORAL | Status: DC | PRN
  Administered 2017-08-29 – 2017-08-30 (×2): 5 mL via ORAL
  Filled 2017-08-29 (×2): qty 5

## 2017-08-29 MED ORDER — PHENYLEPH-CPM-DM-ASPIRIN 7.8-2-10-325 MG PO TBEF: 2.0000 | EFFERVESCENT_TABLET | Freq: Every evening | ORAL | Status: DC | PRN

## 2017-08-29 MED ORDER — UMECLIDINIUM BROMIDE 62.5 MCG/INH IN AEPB
1.0000 | INHALATION_SPRAY | Freq: Every day | RESPIRATORY_TRACT | Status: DC
  Administered 2017-08-30: 1 via RESPIRATORY_TRACT
  Filled 2017-08-29: qty 7

## 2017-08-29 MED ORDER — PRO-STAT SUGAR FREE PO LIQD: 30.0000 mL | Freq: Every day | ORAL | Status: DC

## 2017-08-29 MED ORDER — FUROSEMIDE 10 MG/ML IJ SOLN
40.0000 mg | Freq: Every day | INTRAMUSCULAR | Status: DC
  Administered 2017-08-29: 40 mg via INTRAVENOUS
  Filled 2017-08-29: qty 4

## 2017-08-29 MED ORDER — FLUTICASONE FUROATE-VILANTEROL 100-25 MCG/INH IN AEPB
1.0000 | INHALATION_SPRAY | Freq: Every day | RESPIRATORY_TRACT | Status: DC
  Filled 2017-08-29: qty 28

## 2017-08-29 MED ORDER — DIPHENHYDRAMINE HCL 25 MG PO CAPS: 25.0000 mg | ORAL_CAPSULE | Freq: Every evening | ORAL | Status: DC | PRN

## 2017-08-29 MED ORDER — METHYLPREDNISOLONE SODIUM SUCC 125 MG IJ SOLR: 60.0000 mg | Freq: Two times a day (BID) | INTRAMUSCULAR | Status: DC

## 2017-08-29 MED ORDER — PHENYLEPHRINE HCL 10 MG PO TABS
10.0000 mg | ORAL_TABLET | Freq: Every evening | ORAL | Status: DC | PRN
  Filled 2017-08-29: qty 1

## 2017-08-29 NOTE — Progress Notes (Signed)
PT Cancellation Note  Patient Details Name: [name] MRN: [id number] DOB: [birth year]   Cancelled Treatment:     PT order received but eval deferred at request of pt 2* ++fatigue.  Will follow.   Corlis Angelica 08/29/2017, 1:43 PM

## 2017-08-29 NOTE — Consult Note (Signed)
Consultation Note Date: 08/29/2017   Patient Name: [name]  DOB: 05-Nov-1960  MRN: [id number]  Age / Sex: 57 y.o., male  PCP: [name], MD Referring Physician: Patrecia Pour, Christean Grief, MD  Reason for Consultation: Establishing goals of care  HPI/Patient Profile: 57 y.o. male  with past medical history of stage IV NSCLC, O2 dependent COPD, and diastolic dysfuntion with h/o recurrent CHF. Patient has multiple hospitalizations (16) and ED visits (24) within the past six months. He was last hospitalized 08/18/17 to 08/22/17 with COPD exacerbation. He has been seen back in the ED almost daily since discharge from the hospital. Patient was admitted to hospice care at the SNF on 08/27/17 but called EMS due to shortness of breath and anxiety on 09/05/2017 with acute on chronic respiratory failure related to COPD exacerbation.   Clinical Assessment and Goals of Care: Patient is well known to both inpatient and community palliative care. I was able to speak with Gonzella Lex, NP, who helped coordinate patient's admission yesterday to hospice services. I also discussed the case with the hospital liaison for hospice who will also see the patient.   I attempted to speak with patient about his goals. However, patient perseverated on the idea that money had been stolen from him at the nursing facility did not want to engage in conversation unrelated to that issue. Initially, he seemed to associate hospice with the nursing facility and his perception that money had been stolen. However, I explained what hospice would entail and that him paying money was not related to his care under hospice. We talked about the philosophy of comfort and the need to coordinate medical/hospice care to keep him out of the hospital. I encouraged patient to utilize hospice services prior to calling EMS to return to the hospital.   Patient says that  his primary issue is anxiety related and that he typically calms when someone provides him a medical explanation. Again, I encouraged him to first call hospice with medical concerns.   I reviewed his home medications. Patient is taking clonazepam 0.5mg  BID and hydroxyzine 10mg  TID prn for anxiety. It is unclear how often he is receiving the hydroxyzine or if this is helping. Patient is also on mirtazapine, presumably as an appetite stimulant. I would consider increasing the clonazepam to 1mg  BID and switching from mirtazapine to an SSRI. Consideration could be given for rotation from clonazepam to lorazepam 0.5 q4h prn but it is unclear if prn medications would be given at his nursing facility.   He does appear to be relatively maximized on COPD treatment with Trelegy Ellipta, albuterol nebs, and prednisone. He has antitussives and opioids to be used prn for pain/dyspnea.   SUMMARY OF RECOMMENDATIONS   1. Hospice to assist with coordination of discharge 2. DNR noted 3. Consider increased clonazepam and switching from mirtazapine to SSRI     Primary Diagnoses: Present on Admission: . Adenocarcinoma of left lung, stage 4 ([place]) . Acute on chronic respiratory failure with hypoxia ([place]) . Acute on  chronic diastolic CHF (congestive heart failure) ([place]) . CKD (chronic kidney disease), stage III ([place]) . COPD exacerbation ([place]) . HTN (hypertension)   I have reviewed the medical record, interviewed the patient and family, and examined the patient. The following aspects are pertinent.  Past Medical History:  Diagnosis Date  . Abdominal pain 06/04/2016  . Adenocarcinoma of left lung, stage 4 ([place]) 05/02/2016  . Alcohol abuse   . Bronchitis due to tobacco use ([place])   . Cancer ([place])    Lung  . COPD (chronic obstructive pulmonary disease) ([place])    on home o2 2LNC   . Dehydration 06/04/2016  . Encounter for antineoplastic chemotherapy 05/02/2016  . Gastritis   . Goals of care,  counseling/discussion 05/02/2016  . Hematemesis   . HTN (hypertension) 10/30/2016  . Pulmonary embolism ([place]) 06/30/2017   on CTA chest  . Recurrent upper respiratory infection (URI)   . Seizures ([place]) 05/2011   new onset  . Shingles outbreak 08/27/2017   Social History   Socioeconomic History  . Marital status: Single    Spouse name: Not on file  . Number of children: Not on file  . Years of education: Not on file  . Highest education level: Not on file  Occupational History  . Not on file  Social Needs  . Financial resource strain: Not on file  . Food insecurity:    Worry: Not on file    Inability: Not on file  . Transportation needs:    Medical: Not on file    Non-medical: Not on file  Tobacco Use  . Smoking status: Current Some Day Smoker    Packs/day: 0.10    Years: 30.00    Pack years: 3.00    Types: Cigarettes  . Smokeless tobacco: Never Used  . Tobacco comment: 1 cig a week  Substance and Sexual Activity  . Alcohol use: No    Frequency: Never  . Drug use: No  . Sexual activity: Yes  Lifestyle  . Physical activity:    Days per week: Not on file    Minutes per session: Not on file  . Stress: Not on file  Relationships  . Social connections:    Talks on phone: Not on file    Gets together: Not on file    Attends religious service: Not on file    Active member of club or organization: Not on file    Attends meetings of clubs or organizations: Not on file    Relationship status: Not on file  Other Topics Concern  . Not on file  Social History Narrative  . Not on file   Family History  Problem Relation Age of Onset  . Cancer Father   . Diabetes Mellitus II Mother    Scheduled Meds: . albuterol  2.5 mg Nebulization Q4H  . apixaban  5 mg Oral BID  . arformoterol  15 mcg Nebulization BID  . budesonide (PULMICORT) nebulizer solution  0.5 mg Nebulization BID  . feeding supplement (ENSURE ENLIVE)  237 mL Oral TID BM  . feeding supplement (PRO-STAT  SUGAR FREE 64)  30 mL Oral Daily  . ferrous sulfate  325 mg Oral BID WC  . folic acid  1 mg Oral Daily  . furosemide  40 mg Intravenous Daily  . gabapentin  100 mg Oral TID  . guaiFENesin  1,200 mg Oral BID  . insulin aspart  0-15 Units Subcutaneous TID WC  . insulin aspart  0-5 Units Subcutaneous QHS  .  loratadine  10 mg Oral Daily  . mouth rinse  15 mL Mouth Rinse BID  . metoCLOPramide  5 mg Oral TID  . metoprolol tartrate  50 mg Oral BID  . mirtazapine  15 mg Oral QHS  . pantoprazole  40 mg Oral BID  . polyethylene glycol  17 g Oral Daily  . potassium chloride  20 mEq Oral Daily  . predniSONE  40 mg Oral Q breakfast  . senna-docusate  2 tablet Oral BID  . sucralfate  1 g Oral TID WC & HS  . umeclidinium bromide  1 puff Inhalation Daily  . valACYclovir  1,000 mg Oral TID   Continuous Infusions: . ceFEPime (MAXIPIME) IV Stopped (08/29/17 1050)   PRN Meds:.albuterol, phenylephrine **AND** diphenhydrAMINE **AND** dextromethorphan **AND** aspirin EC, chlorpheniramine-HYDROcodone, clonazePAM, hydrALAZINE, HYDROmorphone, hydrOXYzine, oxyCODONE Medications Prior to Admission:  Prior to Admission medications   Medication Sig Start Date End Date Taking? Authorizing Provider  albuterol (PROVENTIL) (2.5 MG/3ML) 0.083% nebulizer solution 1 neb every 4-6 hours as needed for wheezing and shortness of breath 04/01/17   Parrett, Tammy S, NP  Amino Acids-Protein Hydrolys (FEEDING SUPPLEMENT, PRO-STAT SUGAR FREE 64,) LIQD Take 30 mLs by mouth daily. 08/17/17   Jani Gravel, MD  amoxicillin-clavulanate (AUGMENTIN) 500-125 MG tablet Take 1 tablet (500 mg total) by mouth 3 (three) times daily for 7 days. 08/22/17 08/29/17  Roxan Hockey, MD  apixaban (ELIQUIS) 5 MG TABS tablet Take 5 mg by mouth 2 (two) times daily.    [provider]  chlorpheniramine-HYDROcodone (TUSSIONEX PENNKINETIC ER) 10-8 MG/5ML SUER Take 5 mLs by mouth every 12 (twelve) hours as needed for cough. 08/22/17   Roxan Hockey,  MD  clonazePAM (KLONOPIN) 0.5 MG tablet Take 0.5 tablets (0.25 mg total) by mouth 2 (two) times daily as needed for anxiety. 08/22/17   Roxan Hockey, MD  feeding supplement, ENSURE ENLIVE, (ENSURE ENLIVE) LIQD Take 237 mLs by mouth 3 (three) times daily between meals. 08/17/17   Jani Gravel, MD  ferrous sulfate 325 (65 FE) MG tablet Take 1 tablet (325 mg total) by mouth 2 (two) times daily with a meal. 07/12/17   Lavina Hamman, MD  Fluticasone-Umeclidin-Vilant (TRELEGY ELLIPTA) 100-62.5-25 MCG/INH AEPB Inhale 1 puff into the lungs daily. 05/31/17   Aline August, MD  folic acid (FOLVITE) 1 MG tablet Take 1 tablet (1 mg total) by mouth daily. 07/13/17   Lavina Hamman, MD  gabapentin (NEURONTIN) 100 MG capsule Take 1 capsule (100 mg total) by mouth 3 (three) times daily. 07/12/17   Lavina Hamman, MD  guaiFENesin (MUCINEX) 600 MG 12 hr tablet Take 2 tablets (1,200 mg total) by mouth 2 (two) times daily. 08/22/17   Roxan Hockey, MD  HYDROmorphone (DILAUDID) 4 MG tablet Take 1 every 6 hours for pain not helped by the oxycodone Patient taking differently: Take 4 mg by mouth See admin instructions. Take 1 every 6 hours for pain not helped by the oxycodone 08/23/17   Milton Ferguson, MD  hydrOXYzine (ATARAX/VISTARIL) 10 MG tablet Take 1 tablet (10 mg total) by mouth 3 (three) times daily as needed for anxiety. 08/22/17   Roxan Hockey, MD  insulin aspart (NOVOLOG) 100 UNIT/ML injection 0-5 Units, Subcutaneous, Daily at bedtime, First dose on Fri 08/15/17 at 2200 Correction coverage: HS scale CBG < 70: implement hypoglycemia protocol CBG 70 - 120: 0 units CBG 121 - 150: 0 units CBG 151 - 200: 0 units CBG 201 - 250: 2 units CBG 251 - 300: 3  units CBG 301 - 350: 4 units CBG 351 - 400: 5 units CBG > 400: call MD and obtain STAT lab verification 08/22/17   Roxan Hockey, MD  insulin aspart (NOVOLOG) 100 UNIT/ML injection Please inject  0-9 Units, Subcutaneous, 3 times daily with meals, First dose on Fri  08/15/17 at 1700 Correction coverage: Sensitive (thin, NPO, renal) CBG < 70: implement hypoglycemia protocol CBG 70 - 120: 0 units CBG 121 - 150: 1 unit CBG 151 - 200: 2 units CBG 201 - 250: 3 units CBG 251 - 300: 5 units CBG 301 - 350: 7 units CBG 351 - 400: 9 units CBG > 400: call MD and obtain STAT lab verification 08/22/17   Roxan Hockey, MD  loratadine (CLARITIN) 10 MG tablet Take 10 mg by mouth daily.    [provider]  metoCLOPramide (REGLAN) 5 MG tablet Take 1 tablet (5 mg total) by mouth 3 (three) times daily. 08/17/17 08/17/18  Jani Gravel, MD  metoprolol tartrate (LOPRESSOR) 50 MG tablet Take 1 tablet (50 mg total) by mouth 2 (two) times daily. 08/22/17   Roxan Hockey, MD  mirtazapine (REMERON) 15 MG tablet Take 1 tablet (15 mg total) by mouth at bedtime. 08/22/17 08/22/18  Roxan Hockey, MD  Multiple Vitamin (THERA-TABS PO) Take 1 tablet by mouth daily.    [provider]  ondansetron (ZOFRAN) 4 MG tablet Take 1 tablet (4 mg total) by mouth every 6 (six) hours as needed for nausea. 08/17/17   Jani Gravel, MD  Oxycodone HCl 10 MG TABS Take 1 tablet (10 mg total) by mouth every 4 (four) hours as needed (pain). 08/22/17   Roxan Hockey, MD  pantoprazole (PROTONIX) 40 MG tablet Take 1 tablet (40 mg total) by mouth 2 (two) times daily. 12/18/16   Eugenie Filler, MD  Phenyleph-CPM-DM-Aspirin (ALKA-SELTZER PLUS COLD & COUGH) 7.08-22-08-325 MG TBEF Take 2 tablets by mouth at bedtime as needed (cough).    [provider]  polyethylene glycol (MIRALAX / GLYCOLAX) packet Take 17 g by mouth daily. 07/29/17   Lavina Hamman, MD  potassium chloride (K-DUR) 10 MEQ tablet Take 2 tablets (20 mEq total) by mouth daily. 07/28/17   Lavina Hamman, MD  predniSONE (DELTASONE) 20 MG tablet Take 1 tablet (20 mg total) by mouth daily with breakfast. 08/23/17   Emokpae, Courage, MD  senna-docusate (SENOKOT-S) 8.6-50 MG tablet Take 2 tablets by mouth 2 (two) times daily. 07/12/17    Lavina Hamman, MD  sucralfate (CARAFATE) 1 GM/10ML suspension Take 10 mLs (1 g total) by mouth 4 (four) times daily -  with meals and at bedtime. 07/12/17   Lavina Hamman, MD  valACYclovir (VALTREX) 500 MG tablet Take 1 tablet (500 mg total) by mouth 2 (two) times daily. Patient taking differently: Take 1,000 mg by mouth 3 (three) times daily.  08/26/17   Curt Bears, MD   No Known Allergies Review of Systems  Constitutional: Positive for activity change.  Respiratory: Positive for shortness of breath.     Physical Exam  Constitutional:  Thin  Cardiovascular: Normal rate and regular rhythm.  Pulmonary/Chest: Effort normal. He has decreased breath sounds. He has wheezes.  Skin: Skin is warm and dry.    Vital Signs: BP 135/73   Pulse 95   Temp (!) 96.9 F (36.1 C) (Oral)   Resp 19   Ht 5\' 11"  (1.803 m)   Wt 52.2 kg   SpO2 97%   BMI 16.04 kg/m  Pain Scale:  0-10   Pain Score: Asleep   SpO2: SpO2: 97 % O2 Device:SpO2: 97 % O2 Flow Rate: .O2 Flow Rate (L/min): 6 L/min  IO: Intake/output summary:   Intake/Output Summary (Last 24 hours) at 08/29/2017 1350 Last data filed at 08/29/2017 1334 Gross per 24 hour  Intake 446.61 ml  Output 1125 ml  Net -678.39 ml    LBM: Last BM Date: 09/06/2017 Baseline Weight: Weight: 52.2 kg Most recent weight: Weight: 52.2 kg     Palliative Assessment/Data:     Time In: 1300 Time Out: 1400 Time Total: 60 minutes Greater than 50%  of this time was spent counseling and coordinating care related to the above assessment and plan.  Signed by: Irean Hong, NP   Please contact Palliative Medicine Team phone at [phone] for questions and concerns.  For individual provider: See Shea Evans

## 2017-08-29 NOTE — Progress Notes (Signed)
OT Cancellation Note  Patient Details Name: [name] MRN: [id number] DOB: [birth year]   Cancelled Treatment:    Reason Eval/Treat Not Completed: Other (comment).  Per nursing, pt wants to rest today. Will check either over the weekend or Monday as schedule permits.  Vanden Fawaz 08/29/2017, 1:24 PM  Lesle Chris, OTR/L [phone] 08/29/2017

## 2017-08-29 NOTE — Progress Notes (Signed)
Pt became verbally aggressive and threatened nurse, this RN called patient's mother in order to get patient oriented and patient proceeded to hang up on mother after cursing at her for not calling the police.  The patient then started yelling at this RN and kicked her out of the room and told not to come back.  AC notified, security, and Dr. Quincy Simmonds as well.  Dr. Quincy Simmonds placed orders in case patient became more violent or threatening throughout shift.  Patient able to be reoriented by other staff after a while. Currently alert and oriented x 4

## 2017-08-29 NOTE — Progress Notes (Signed)
CSW consult- Patient from [name].   Patient is currently under hospice care at [facility].  HPOG will assist with coordination of discharge needs.   If needed, social work will also assist with any D/C needs.   Kathrin Greathouse, Marlinda Mike, MSW Clinical Social Worker  [phone] 08/29/2017  3:12 PM

## 2017-08-29 NOTE — Progress Notes (Signed)
Initial Nutrition Assessment  DOCUMENTATION CODES:   Severe malnutrition in context of chronic illness, Underweight  INTERVENTION:  - Continue Ensure Enlive TID, each supplement provides 350 kcal and 20 grams of protein. - Continue Prostat once/day, this supplement provides 100 kcal and 15 grams of protein. - Continue to encourage PO intakes.    NUTRITION DIAGNOSIS:   Severe Malnutrition related to chronic illness, catabolic illness, cancer and cancer related treatments as evidenced by severe fat depletion, severe muscle depletion.  GOAL:   Patient will meet greater than or equal to 90% of their needs  MONITOR:   PO intake, Supplement acceptance, Weight trends, Labs, I & O's  REASON FOR ASSESSMENT:   Consult Assessment of nutrition requirement/status  ASSESSMENT:   57 y.o. male with medical history significant of Stage 4 NSCLC, PE, COPD on home O2, and CHF. Patient has not been doing well from a respiratory standpoint recently requiring frequent admissions to hospital. He is DNI/DNR. Patient was last discharged for COPD exacerbation on 8/2. Patient reportedly stopped taking all meds at SNF around 8/6 as he believes that SNF's med list is not correct/is not the same as hospital med list. His 40 mg PO Lasix/day was discontinued and was not on the [facility] He presented to the ED today with SOB, wheezing, cough, respiratory distress.  Patient well known to this RD from previous admissions. Patient reports ongoing abdominal pain, pressure. He does not like the food at the facility where he lives but will try to eat what he can there. He always eats very well during hospitalizations. He likes Ensure supplements and is willing to continue receiving them. During prior admissions he would drink nearly all bottles of Ensure provided.   NFPE outlined below. Weight has been mainly stable over the past month.  Medications reviewed; 325 mg ferrous sulfate BID, 40 mg IV Lasix/day, sliding scale  Novolog, 125 mg Solu-medrol x1 dose yesterday, 5 mg Reglan TID, 40 mg oral Protonix BID, 1 packet Miralax/day, 20 mEq oral KCl/day, 40 mg Deltasone/day, 2 tablets Senokot BID, 1 g Carafate TID. Labs reviewed; CBGs: 224 and 136 mg/dl today, Cl: 97 mmol/L, creatinine: 1.71 mg/dL, GFR: 49 mL/min.     NUTRITION - FOCUSED PHYSICAL EXAM:    Most Recent Value  Orbital Region  Moderate depletion  Upper Arm Region  Severe depletion  Thoracic and Lumbar Region  Severe depletion  Buccal Region  Moderate depletion  Temple Region  Moderate depletion  Clavicle Bone Region  Severe depletion  Clavicle and Acromion Bone Region  Severe depletion  Scapular Bone Region  Moderate depletion  Dorsal Hand  Moderate depletion  Patellar Region  Severe depletion  Anterior Thigh Region  Unable to assess  Posterior Calf Region  Unable to assess  Edema (RD Assessment)  Severe [BLE]  Hair  Reviewed  Eyes  Reviewed  Mouth  Reviewed  Skin  Reviewed  Nails  Reviewed       Diet Order:   Diet Order            Diet Carb Modified Fluid consistency: Thin; Room service appropriate? Yes  Diet effective now              EDUCATION NEEDS:   No education needs have been identified at this time  Skin:  Skin Assessment: Reviewed RN Assessment  Last BM:  8/8  Height:   Ht Readings from Last 1 Encounters:  09/09/2017 5\' 11"  (1.803 m)    Weight:   Wt Readings from  Last 1 Encounters:  08/30/2017 52.2 kg    Ideal Body Weight:  78.18 kg  BMI:  Body mass index is 16.04 kg/m.  Estimated Nutritional Needs:   Kcal:  1830-2090  Protein:  94-104 grams (1.8-2 grams/kg)  Fluid:  >/= 1.5 L/day     Jarome Matin, MS, RD, LDN, [facility] Inpatient Clinical Dietitian Pager # [phone] After hours/weekend pager # [phone]

## 2017-08-29 NOTE — H&P (Signed)
History and Physical    [name] GYJ:[id number] DOB: [birth year] DOA: 09/01/2017  PCP: [name], MD  Patient coming from: SNF  I have personally briefly reviewed patient's old medical records in [place]  Chief Complaint: SOB  HPI: [name] is a 57 y.o. male with medical history significant of Stage 4 NSCLC, PE, COPD on home O2, diastolic CHF.  Patient has not been doing well from a respiratory standpoint recently requiring frequent admissions to hospital.  He is DNI/DNR and we are trying to progress to hospice, but he keeps getting admitted to hospital.  Patient was last discharged for COPD exacerbation on 8/2.  Patient reportedly stopped taking all meds at SNF around 8/6 or so as he belives that their med list isnt correct / "isnt the same as our med list".  Unfortunatly it appears patient is at least partially correct, somehow his Lasix 40mg  PO daily managed to get discontinued and isnt even on the [facility].  He presents to the ED today with SOB, wheezing, cough, respiratory distress.  ED Course: some improvement in wheezing after nebs, but still having increased WOB, solumedrol, mag, in ED.  BNP 121.  CXR this time actually shows asymmetric R sided edema.   Review of Systems: As per HPI otherwise 10 point review of systems negative.   Past Medical History:  Diagnosis Date  . Abdominal pain 06/04/2016  . Adenocarcinoma of left lung, stage 4 ([place]) 05/02/2016  . Alcohol abuse   . Bronchitis due to tobacco use ([place])   . Cancer ([place])    Lung  . COPD (chronic obstructive pulmonary disease) ([place])    on home o2 2LNC   . Dehydration 06/04/2016  . Encounter for antineoplastic chemotherapy 05/02/2016  . Gastritis   . Goals of care, counseling/discussion 05/02/2016  . Hematemesis   . HTN (hypertension) 10/30/2016  . Pulmonary embolism ([place]) 06/30/2017   on CTA chest  . Recurrent upper respiratory infection (URI)   . Seizures ([place]) 05/2011   new onset  .  Shingles outbreak 08/27/2017    Past Surgical History:  Procedure Laterality Date  . ESOPHAGOGASTRODUODENOSCOPY (EGD) WITH PROPOFOL N/A 12/17/2016   Procedure: ESOPHAGOGASTRODUODENOSCOPY (EGD) WITH PROPOFOL;  Surgeon: Ladene Artist, MD;  Location: [facility];  Service: Endoscopy;  Laterality: N/A;  . IR FLUORO GUIDE PORT INSERTION RIGHT  05/13/2016  . IR US GUIDE VASC ACCESS RIGHT  05/13/2016  . NO PAST SURGERIES    . THORACENTESIS  07/05/2017   ultrasound guided     reports that he has been smoking cigarettes. He has a 3.00 pack-year smoking history. He has never used smokeless tobacco. He reports that he does not drink alcohol or use drugs.  No Known Allergies  Family History  Problem Relation Age of Onset  . Cancer Father   . Diabetes Mellitus II Mother      Prior to Admission medications   Medication Sig Start Date End Date Taking? Authorizing Provider  albuterol (PROVENTIL) (2.5 MG/3ML) 0.083% nebulizer solution 1 neb every 4-6 hours as needed for wheezing and shortness of breath 04/01/17   Parrett, Tammy S, NP  Amino Acids-Protein Hydrolys (FEEDING SUPPLEMENT, PRO-STAT SUGAR FREE 64,) LIQD Take 30 mLs by mouth daily. 08/17/17   Jani Gravel, MD  amoxicillin-clavulanate (AUGMENTIN) 500-125 MG tablet Take 1 tablet (500 mg total) by mouth 3 (three) times daily for 7 days. 08/22/17 08/29/17  Roxan Hockey, MD  apixaban (ELIQUIS) 5 MG TABS tablet Take 5 mg by  mouth 2 (two) times daily.    [provider]  chlorpheniramine-HYDROcodone (TUSSIONEX PENNKINETIC ER) 10-8 MG/5ML SUER Take 5 mLs by mouth every 12 (twelve) hours as needed for cough. 08/22/17   Roxan Hockey, MD  clonazePAM (KLONOPIN) 0.5 MG tablet Take 0.5 tablets (0.25 mg total) by mouth 2 (two) times daily as needed for anxiety. 08/22/17   Roxan Hockey, MD  feeding supplement, ENSURE ENLIVE, (ENSURE ENLIVE) LIQD Take 237 mLs by mouth 3 (three) times daily between meals. 08/17/17   Jani Gravel, MD  ferrous sulfate  325 (65 FE) MG tablet Take 1 tablet (325 mg total) by mouth 2 (two) times daily with a meal. 07/12/17   Lavina Hamman, MD  Fluticasone-Umeclidin-Vilant (TRELEGY ELLIPTA) 100-62.5-25 MCG/INH AEPB Inhale 1 puff into the lungs daily. 05/31/17   Aline August, MD  folic acid (FOLVITE) 1 MG tablet Take 1 tablet (1 mg total) by mouth daily. 07/13/17   Lavina Hamman, MD  gabapentin (NEURONTIN) 100 MG capsule Take 1 capsule (100 mg total) by mouth 3 (three) times daily. 07/12/17   Lavina Hamman, MD  guaiFENesin (MUCINEX) 600 MG 12 hr tablet Take 2 tablets (1,200 mg total) by mouth 2 (two) times daily. 08/22/17   Roxan Hockey, MD  HYDROmorphone (DILAUDID) 4 MG tablet Take 1 every 6 hours for pain not helped by the oxycodone Patient taking differently: Take 4 mg by mouth See admin instructions. Take 1 every 6 hours for pain not helped by the oxycodone 08/23/17   Milton Ferguson, MD  hydrOXYzine (ATARAX/VISTARIL) 10 MG tablet Take 1 tablet (10 mg total) by mouth 3 (three) times daily as needed for anxiety. 08/22/17   Roxan Hockey, MD  insulin aspart (NOVOLOG) 100 UNIT/ML injection 0-5 Units, Subcutaneous, Daily at bedtime, First dose on Fri 08/15/17 at 2200 Correction coverage: HS scale CBG < 70: implement hypoglycemia protocol CBG 70 - 120: 0 units CBG 121 - 150: 0 units CBG 151 - 200: 0 units CBG 201 - 250: 2 units CBG 251 - 300: 3 units CBG 301 - 350: 4 units CBG 351 - 400: 5 units CBG > 400: call MD and obtain STAT lab verification 08/22/17   Roxan Hockey, MD  insulin aspart (NOVOLOG) 100 UNIT/ML injection Please inject  0-9 Units, Subcutaneous, 3 times daily with meals, First dose on Fri 08/15/17 at 1700 Correction coverage: Sensitive (thin, NPO, renal) CBG < 70: implement hypoglycemia protocol CBG 70 - 120: 0 units CBG 121 - 150: 1 unit CBG 151 - 200: 2 units CBG 201 - 250: 3 units CBG 251 - 300: 5 units CBG 301 - 350: 7 units CBG 351 - 400: 9 units CBG > 400: call MD and obtain STAT lab  verification 08/22/17   Roxan Hockey, MD  loratadine (CLARITIN) 10 MG tablet Take 10 mg by mouth daily.    [provider]  metoCLOPramide (REGLAN) 5 MG tablet Take 1 tablet (5 mg total) by mouth 3 (three) times daily. 08/17/17 08/17/18  Jani Gravel, MD  metoprolol tartrate (LOPRESSOR) 50 MG tablet Take 1 tablet (50 mg total) by mouth 2 (two) times daily. 08/22/17   Roxan Hockey, MD  mirtazapine (REMERON) 15 MG tablet Take 1 tablet (15 mg total) by mouth at bedtime. 08/22/17 08/22/18  Roxan Hockey, MD  Multiple Vitamin (THERA-TABS PO) Take 1 tablet by mouth daily.    [provider]  ondansetron (ZOFRAN) 4 MG tablet Take 1 tablet (4 mg total) by mouth every 6 (six) hours as  needed for nausea. 08/17/17   Jani Gravel, MD  Oxycodone HCl 10 MG TABS Take 1 tablet (10 mg total) by mouth every 4 (four) hours as needed (pain). 08/22/17   Roxan Hockey, MD  pantoprazole (PROTONIX) 40 MG tablet Take 1 tablet (40 mg total) by mouth 2 (two) times daily. 12/18/16   Eugenie Filler, MD  Phenyleph-CPM-DM-Aspirin (ALKA-SELTZER PLUS COLD & COUGH) 7.08-22-08-325 MG TBEF Take 2 tablets by mouth at bedtime as needed (cough).    [provider]  polyethylene glycol (MIRALAX / GLYCOLAX) packet Take 17 g by mouth daily. 07/29/17   Lavina Hamman, MD  potassium chloride (K-DUR) 10 MEQ tablet Take 2 tablets (20 mEq total) by mouth daily. 07/28/17   Lavina Hamman, MD  predniSONE (DELTASONE) 20 MG tablet Take 1 tablet (20 mg total) by mouth daily with breakfast. 08/23/17   Emokpae, Courage, MD  senna-docusate (SENOKOT-S) 8.6-50 MG tablet Take 2 tablets by mouth 2 (two) times daily. 07/12/17   Lavina Hamman, MD  sucralfate (CARAFATE) 1 GM/10ML suspension Take 10 mLs (1 g total) by mouth 4 (four) times daily -  with meals and at bedtime. 07/12/17   Lavina Hamman, MD  valACYclovir (VALTREX) 500 MG tablet Take 1 tablet (500 mg total) by mouth 2 (two) times daily. Patient taking differently: Take 1,000 mg  by mouth 3 (three) times daily.  08/26/17   Curt Bears, MD    Physical Exam: Vitals:   08/30/2017 2117 09/12/2017 2229 09/09/2017 2300 08/29/17 0111  BP:  (!) 166/99 (!) 153/91 (!) 197/109  Pulse:  (!) 106 (!) 120 (!) 110  Resp:   18 17  Temp:      TempSrc:      SpO2:  100% 98% 92%  Weight: 52.2 kg     Height: 5\' 11"  (1.803 m)       Constitutional: NAD, calm, comfortable Eyes: PERRL, lids and conjunctivae normal ENMT: Mucous membranes are moist. Posterior pharynx clear of any exudate or lesions.Normal dentition.  Neck: normal, supple, no masses, no thyromegaly Respiratory: Dyspnea, tachypnea, accessory muscle use Cardiovascular: 3+ BLE pitting edema Abdomen: no tenderness, no masses palpated. No hepatosplenomegaly. Bowel sounds positive.  Musculoskeletal: no clubbing / cyanosis. No joint deformity upper and lower extremities. Good ROM, no contractures. Normal muscle tone.  Skin: no rashes, lesions, ulcers. No induration Neurologic: CN 2-12 grossly intact. Sensation intact, DTR normal. Strength 5/5 in all 4.  Psychiatric: Normal judgment and insight. Alert and oriented x 3. Normal mood.    Labs on Admission: I have personally reviewed following labs and imaging studies  CBC: Recent Labs  Lab 08/23/17 0842 08/24/17 2119 08/26/17 1005 08/27/17 1755 09/03/2017 2159  WBC 18.9* 16.9* 12.7* 15.1* 13.8*  NEUTROABS 16.7 15.1* 10.9* 13.2* 11.7*  HGB 9.6* 9.3* 7.9* 10.8* 11.5*  HCT 30.3* 29.4* 24.4* 33.1* 37.1*  MCV 104.1* 105.8* 101.0* 99.7 102.2*  PLT 269 234 177 169 947   Basic Metabolic Panel: Recent Labs  Lab 08/23/17 0842 08/24/17 2119 08/26/17 1005 08/27/17 1755 08/21/2017 2159  NA 144 144 140 145 142  K 3.9 3.9 3.9 3.5 3.5  CL 92* 90* 95* 98 97*  CO2 39* 41* 36* 38* 32  GLUCOSE 111* 112* 98 92 140*  BUN 42* 33* 22* 20 19  CREATININE 1.55* 1.47* 1.58* 1.50* 1.53*  CALCIUM 9.7 9.7 9.0 9.1 9.1   GFR: Estimated Creatinine Clearance: 38.9 mL/min (A) (by C-G formula  based on SCr of 1.53 mg/dL (H)). Liver  Function Tests: Recent Labs  Lab 08/23/17 0842 08/24/17 2119 08/26/17 1005 08/27/17 1755 08/22/2017 2159  AST 27 21 21 21 22   ALT 21 19 16 14 17   ALKPHOS 113 112 127* 120 118  BILITOT 0.4 0.5 0.2* 0.4 0.8  PROT 6.8 6.5 6.2* 6.3* 6.7  ALBUMIN 3.5 3.5 2.9* 3.1* 3.2*   Recent Labs  Lab 08/24/17 2119 08/26/2017 2159  LIPASE 24 21   No results for input(s): AMMONIA in the last 168 hours. Coagulation Profile: No results for input(s): INR, PROTIME in the last 168 hours. Cardiac Enzymes: Recent Labs  Lab 09/09/2017 2159  TROPONINI 0.04*   BNP (last 3 results) No results for input(s): PROBNP in the last 8760 hours. HbA1C: No results for input(s): HGBA1C in the last 72 hours. CBG: Recent Labs  Lab 08/22/17 0730 08/22/17 1226 08/23/17 0757 08/24/17 0446  GLUCAP 151* 206* 79 76   Lipid Profile: No results for input(s): CHOL, HDL, LDLCALC, TRIG, CHOLHDL, LDLDIRECT in the last 72 hours. Thyroid Function Tests: No results for input(s): TSH, T4TOTAL, FREET4, T3FREE, THYROIDAB in the last 72 hours. Anemia Panel: No results for input(s): VITAMINB12, FOLATE, FERRITIN, TIBC, IRON, RETICCTPCT in the last 72 hours. Urine analysis:    Component Value Date/Time   COLORURINE YELLOW 09/13/2017 2210   APPEARANCEUR CLEAR 09/20/2017 2210   LABSPEC 1.015 08/23/2017 2210   PHURINE 5.0 09/14/2017 2210   GLUCOSEU NEGATIVE 09/07/2017 2210   HGBUR NEGATIVE 09/15/2017 2210   BILIRUBINUR NEGATIVE 09/14/2017 2210   KETONESUR NEGATIVE 08/29/2017 2210   PROTEINUR 100 (A) 09/04/2017 2210   UROBILINOGEN 0.2 05/29/2011 0122   NITRITE NEGATIVE 09/06/2017 2210   LEUKOCYTESUR NEGATIVE 09/12/2017 2210    Radiological Exams on Admission: Dg Chest 2 View  Result Date: 08/27/2017 CLINICAL DATA:  57 year old male with a history back pain EXAM: CHEST - 2 VIEW COMPARISON:  08/24/2017, 08/23/2017, CT 08/23/2017 FINDINGS: Cardiomediastinal silhouette likely unchanged in  size and contour with the right heart border partially obscured by overlying lung/pleural disease. Persistent opacity at the right lung base obscuring the right hemidiaphragm and the right heart border. Thickening of the right minor fissure. Mixed interstitial and airspace opacities of the right lung. Meniscus on the lateral view. Minimal blunting at the left costophrenic angle. Chronic interstitial opacities on the left. Port catheter from right IJ approach is unchanged. IMPRESSION: Unchanged right-sided pleural effusion with associated atelectasis/consolidation. Right IJ port catheter. Electronically Signed   By: Corrie Mckusick D.O.   On: 08/27/2017 19:19   Ct Abdomen Pelvis W Contrast  Result Date: 08/29/2017 CLINICAL DATA:  Acute onset of shortness of breath and generalized abdominal pain. EXAM: CT ABDOMEN AND PELVIS WITH CONTRAST TECHNIQUE: Multidetector CT imaging of the abdomen and pelvis was performed using the standard protocol following bolus administration of intravenous contrast. CONTRAST:  71mL ISOVUE-300 IOPAMIDOL (ISOVUE-300) INJECTION 61% COMPARISON:  CT of the abdomen and pelvis performed 07/16/2017, and right upper quadrant ultrasound performed 07/27/2017 FINDINGS: Lower chest: There is a moderate right-sided pleural effusion, with partial consolidation of the right lower lobe, concerning for pneumonia superimposed on the patient's known lung malignancy. Mild atelectasis or pneumonia is noted at the left lung base. Trace left-sided pleural fluid is noted. Wall thickening along the distal esophagus may reflect esophagitis. Hepatobiliary: The gallbladder is mildly distended. There is mild prominence of the intrahepatic biliary ducts. The common bile duct remains normal in caliber. Would correlate with LFTs. The liver is otherwise unremarkable in appearance. Pancreas: The pancreas is within normal limits.  Spleen: The spleen is unremarkable in appearance. Adrenals/Urinary Tract: The adrenal glands  are unremarkable in appearance. The kidneys are within normal limits. There is no evidence of hydronephrosis. No renal or ureteral stones are identified. Mild nonspecific perinephric stranding is noted bilaterally. Stomach/Bowel: The stomach is grossly unremarkable in appearance. There is question of mild soft tissue edema about the duodenum, which could reflect mild duodenitis. The remainder of the small bowel is grossly unremarkable. The appendix is normal in caliber, without evidence of appendicitis. The colon is grossly unremarkable in appearance. Vascular/Lymphatic: Scattered calcification is seen along the abdominal aorta and its branches. The abdominal aorta is otherwise grossly unremarkable. The inferior vena cava is grossly unremarkable. No retroperitoneal lymphadenopathy is seen. No pelvic sidewall lymphadenopathy is identified. Reproductive: The bladder is mildly distended and grossly unremarkable. The prostate remains normal in size. Other: No additional soft tissue abnormalities are seen. Musculoskeletal: No acute osseous abnormalities are identified. Diffuse sclerosis is again noted at vertebral body T12, and a sclerotic focus is noted at vertebral body L2. The visualized musculature is unremarkable in appearance. IMPRESSION: 1. Moderate right-sided pleural effusion, with partial consolidation of the right lower lobe, concerning for pneumonia superimposed on the patient's known lung malignancy. Mild atelectasis or pneumonia at the left lung base. Trace left-sided pleural fluid noted. 2. Wall thickening along the distal esophagus may reflect esophagitis. 3. Question of mild soft tissue edema about the duodenum, which could reflect mild duodenitis. 4. Mild prominence of the intrahepatic biliary ducts. The common bile duct remains normal in caliber. Would correlate with LFTs. 5. Diffuse sclerosis again noted at vertebral body T12, and a sclerotic focus at vertebral body L2, likely reflecting sequelae of  metastatic disease. Aortic Atherosclerosis (ICD10-I70.0). Electronically Signed   By: Garald Balding M.D.   On: 08/29/2017 00:28   Dg Chest Portable 1 View  Result Date: 08/24/2017 CLINICAL DATA:  Shortness of breath.  History of lung carcinoma EXAM: PORTABLE CHEST 1 VIEW COMPARISON:  August 27, 2017 FINDINGS: Port-A-Cath tip is at the cavoatrial junction. No pneumothorax. There is a right pleural effusion with consolidation in the right base. There is a smaller left pleural effusion with left base atelectasis. There is asymmetric interstitial edema on the right. Heart size and pulmonary vascularity are normal. No adenopathy is appreciable. No bone lesions. IMPRESSION: Persistent right pleural effusion with right base consolidation. Interstitial edema on the right potentially could represent lymphangitic spread of tumor. On the left, there is mild left base atelectasis with small left pleural effusion. Heart size is within normal limits. No adenopathy is appreciable. Overall no appreciable change from 1 day prior. Electronically Signed   By: Lowella Grip III M.D.   On: 09/03/2017 21:54    EKG: Independently reviewed.  Assessment/Plan Principal Problem:   Acute on chronic respiratory failure with hypoxia (HCC) Active Problems:   Adenocarcinoma of left lung, stage 4 (HCC)   HTN (hypertension)   COPD exacerbation (HCC)   CKD (chronic kidney disease), stage III (HCC)   Acute on chronic diastolic CHF (congestive heart failure) ([place])    1. Acute on chronic resp failure with hypoxia - 1. Multifactorial, due to COPD exacerbation, this time I think he has some component of CHF too with pulm edema showing up on CXR and BLE pitting edema and lasix getting dropped from [facility].  And of course the stage 4 lung cancer doesn't help either. 2. BIPAP PRN 3. Treat each as below 2. COPD exacerbation - 1. COPD pathway 2. Wheezing  improved post steroids 3. Will leave on prednisone PO 40 daily for now 4. Adult  wheeze protocol 5. Cefepime for severe exacerbation 6. PRN and scheduled nebs 3. Acute on chronic diastolic CHF - 1. 3+ pitting edema, asymmetric pulm edema on CXR, and lasix missing from his [facility] since discharge! 2. Lasix 40mg  IV daily, first dose now 3. Strict intake and output 4. Daily BMP 5. Grade 1 diastolic dysfunction, preserved EVF on 6/11 echo 4. CKD stage 3 - chronic, stable, monitor with daily BMP 5. HTN - 1. Cont home BP meds 6. Steroid induced hyperglycemia -  1. Mod scale SSI AC 2. And HS coverage 7. Stage 4 NSCLC - 1. Cant do chemo recently due to being in hospital for most of past several months. 2. Will put in for pal care consult again.  DVT prophylaxis: Eliquis Code Status: DNI/DNR Family Communication: No family in room Disposition Plan: TBD Consults called: Pal care Admission status: Admit to inpatient   [place], [place] [facility] Pager [phone] Only works nights!  If 7AM-7PM, please contact the primary day team physician taking care of patient  [email or web address] Password [email or web address]  08/29/2017, 1:22 AM

## 2017-08-29 NOTE — Progress Notes (Signed)
[id number] ([facility] - GIP RN Visit at 1330  This is a related and covered GIP admission with [facility] diagnosis of Lung Cancer per Dr. Tomasa Hosteller. Patient arrived at [facility] ED at 20:56 on 09/18/2017 and was admitted to ICU-Stepdown unit at 02:21 on 08/29/17. Patient admitted to hospital with SOB/exacerbation of COPD after presenting to ER with SOB, wheezing and respiratory distress. Patient was admitted to [facility] care on 08/21/2017 at the facility [place]. Patient began experiencing SOB and anxiety on afternoon of 09/12/2017 and initiated EMS. Hospice was not notified prior to admission.  Day of [facility] GIP: Day 1  Per Bedside RN, patient has been experiencing ongoing SOB with O2 increased to 6L via [place] at this time having to refuse therapy earlier due to extreme fatigue. RN also stated that patient has had  limited PO intake today and may go for a comfort paracentesis to help manage symptoms.  Visited patient in isolation room for shingles, who was sitting up with HOB at 40 degrees with visible SOB at rest - no visitors at side.  Patient opens eyes to verbal stimuli but drifts off to sleep easily during conversation stating that he "is very tired".    Per MD Epic notes: Goal of Care is to continue antibiotic therapy for HCAP after CT scan shows signs of possible infectious process, holding Lasix and monitoring I/Os and continuing nebulizer treatments. Palliative Care Consulted to help establish goals of care.   Medications: Albuterol 2.5mg  nebulizer Q4hr, Eliquis 5mg  tab BID, Brovana 15 mcg nebulizer BID, Pulimicort 0.5mg  nebulizer BID, Ensure 283ml dose TID, Incruse Ellipta 62.16mcg/inhaler 1 puff QD, Insulin slide scale in use, Claritin 10mg  tab QD, Reglan 5mg  Tab TID, Lopressor 50mg  tab BID, Remeeron 15mg  tab HS, K-DUR CR 64mEq tab QD, Deltasone 40mg  tab with breakfast, Valtrex 1000mg  tab TID. Continuous Medications: Maxipime 1g in 127ml NS IVPB q12hr.  PRNS administered  today: Proventil 2.5mg  neb at 0423 and 1355, Tussionex 74ml dose in 10-8mg /59ml suspension at 0521, Klonopin 0.25mg  tab at 0305 and 1132, Apresoline 10mg  inj at 0517, Oxy IR/Roxicodone 10mg  tab at 0304.  [facility] will continue to follow patient daily during hospitalization and continue to initiate conversation about goals of care.   Discharge Planning: Will continue to assess ongoing discharge needs during hospitalization. Please contact [facility] for transport of [facility] patients when ambulance transportation is needed at [phone].   Communication to [facility]: Updated patient contact person Micharl Helmes on patient, offered support and informed that [facility] would continue to follow patient while hospitalized. [facility] contact information provided.   Communication with [facility]: Primary Physician, [name] made aware of patient hospitalization. [facility] MD and RN updated on patient condition. [facility] SW to see patient at 1600 this afternoon.   [facility] Transfer Summary and Medication List placed on shadow chart.  Please contact [facility] with any Hospice related questions.  Gar Ponto, RN [facility] Liaison [place] are now found on AMION

## 2017-08-29 NOTE — Progress Notes (Signed)
Around 2:20 Pt became very confused and stating he wanted to see "Barnett Applebaum" who was the boss and in charge.  Pt stated "Barnett Applebaum had his money" and he would not allow anyone to do anything until he talked with her.  4 different nurses attempted to go in at different times spaced out until 5:30pm in order to draw his blood work off his port.  Dr. Quincy Simmonds notified in order to explain why lab work still not drawn for [place] that was due at 2:30pm.  Pt refused to answer questions on his current location and what year it was.Will continue to monitor

## 2017-08-29 NOTE — Care Management Note (Signed)
Case Management Note  Patient Details  Name: [name] MRN: [id number] Date of Birth: 01-24-1960  Subjective/Objective:     Frequent readmissions due to copd and resp. failure             Action/Plan: hospice is aware of this admission Have suggest the red vent program for the chf Patient may need higher level of care snf versus ltach presently is at [place] [facility].  Expected Discharge Date:                  Expected Discharge Plan:  Home w Hospice Care  In-House Referral:  Clinical Social Work  Discharge planning Services  CM Consult  Post Acute Care Choice:    Choice offered to:     DME Arranged:    DME Agency:     HH Arranged:    [place] Agency:     Status of Service:  In process, will continue to follow  If discussed at Long Length of Stay Meetings, dates discussed:    Additional Comments:  Leeroy Cha, RN 08/29/2017, 8:48 AM

## 2017-08-29 NOTE — Progress Notes (Signed)
[facility]   Patient admitted after midnight by Dr. Alcario Drought, see H&P for further details.  Patient seen, chart review and plan discussed.  Patient is a 57 year old male with past medical history of stage IV NSCLC, oxygen dependent, COPD with diastolic dysfunction who have multiple hospitalization due to C OPD exacerbation.  He is under hospice care at [facility] but was brought to the emergency department due to shortness of breath and anxiety.  ED evaluation chest x-ray showed asymmetric right-sided edema, I have repeat chest CT chest which showed loculated moderate right pleural effusion and right lower and left lower lobe changes consistent with bronchitis.  New patchy retinacular density in the left lower lobe likely represent pneumonia.  Plan: Initial thought of acute on chronic respiratory failure was found to be multifactorial with more component of CHF however patient remains exam since dry, renal function increase after Lasix and CT scan shows signs concerning of infectious process.  Will hold Lasix for now.  Continue antibiotic therapy for HCAP.  Continue to monitor intakes and output.  Palliative care recommendations appreciated.  Continue nebulizer treatment.  Chipper Oman, MD

## 2017-08-30 DIAGNOSIS — I10 Essential (primary) hypertension: Secondary | ICD-10-CM

## 2017-08-30 DIAGNOSIS — C3492 Malignant neoplasm of unspecified part of left bronchus or lung: Secondary | ICD-10-CM

## 2017-08-30 DIAGNOSIS — N183 Chronic kidney disease, stage 3 (moderate): Secondary | ICD-10-CM

## 2017-08-30 DIAGNOSIS — J441 Chronic obstructive pulmonary disease with (acute) exacerbation: Secondary | ICD-10-CM

## 2017-08-30 LAB — BASIC METABOLIC PANEL
Anion gap: 8 (ref 5–15)
BUN: 19 mg/dL (ref 6–20)
CALCIUM: 9.1 mg/dL (ref 8.9–10.3)
CO2: 37 mmol/L — ABNORMAL HIGH (ref 22–32)
CREATININE: 1.56 mg/dL — AB (ref 0.61–1.24)
Chloride: 96 mmol/L — ABNORMAL LOW (ref 98–111)
GFR calc Af Amer: 55 mL/min — ABNORMAL LOW (ref >60)
GFR calc non Af Amer: 47 mL/min — ABNORMAL LOW (ref >60)
Glucose, Bld: 112 mg/dL — ABNORMAL HIGH (ref 70–99)
Potassium: 4.4 mmol/L (ref 3.5–5.1)
SODIUM: 141 mmol/L (ref 135–145)

## 2017-08-30 LAB — GLUCOSE, CAPILLARY
GLUCOSE-CAPILLARY: 79 mg/dL (ref 70–99)
Glucose-Capillary: 82 mg/dL (ref 70–99)
Glucose-Capillary: 95 mg/dL (ref 70–99)
Glucose-Capillary: 107 mg/dL — ABNORMAL HIGH (ref 70–99)

## 2017-08-30 MED ORDER — FUROSEMIDE 40 MG PO TABS
40.0000 mg | ORAL_TABLET | Freq: Every day | ORAL | Status: DC
  Filled 2017-08-30: qty 1

## 2017-08-30 MED ORDER — FUROSEMIDE 20 MG PO TABS: 20.0000 mg | ORAL_TABLET | Freq: Every day | ORAL | Status: DC

## 2017-08-30 NOTE — Progress Notes (Signed)
Dr Quincy Simmonds notified that pt refused morning medications

## 2017-08-30 NOTE — Progress Notes (Signed)
PT Cancellation Note  Patient Details Name: SHULEM MADER MRN: [id number] DOB: 02/14/1960   Cancelled Treatment:    Reason Eval/Treat Not Completed: PT screened, no needs identified, will sign off;  Spoke with RN, pt is napping now and per bedside RN he will likely refuse again;  After discussion with RN and reviewing chart/previous PT notes it does not appear that pt is a candidate for PT intervention; He has had a marked decline on each previous admission when PT has seen him (mostly in the ED on one of his last 24 ED visits in the last 6 mos); he is minimally ambulatory at baseline but is +1 assist here in the ICU/SDU when he feels he is able to move; He is a Hospice pt and he cannot be skilled from PT standpoint and on Hospice care at the nursing home facility;  Recommend pt be OOB/mobilize with nursing staff when feels up to it; PT signing off;   [facility] 08/30/2017, 2:28 PM

## 2017-08-30 NOTE — Progress Notes (Signed)
Pt refused his neb tx. NO distress noted at this time.

## 2017-08-30 NOTE — Progress Notes (Signed)
Reviewed morning medications with pt, he agreed to taking all that were scheduled this morning and asked for something for pain. We agreed on dilaudid oral. This RN got all medications including 4mg  dilaudid oral, scanned and opened medications then pt refused to take all of them. Pt then yelled at this RN and told me to leave him alone. Medications including dilaudid wasted in sharps container with Production assistant, radio

## 2017-08-30 NOTE — Progress Notes (Signed)
Hospice and [facility]) SW visit  As previously documented, this is a related and covered GIP admission of 08/29/17 with [facility] diagnosis of Lung Cancer per Dr. Tomasa Hosteller. Patient arrived at [facility] ED at 20:56 on 08/28/17 and admitted to ICU-Stepdown unit at 02:21 on 08/29/17. Patient admitted to hospital with SOB/exacerbation of COPD after presenting to ED with SOB, wheezing and respiratory distress. Patient just admitted to [facility] on 08/28/17 at [place] [facility]. Patient began experiencing SOB and anxiety on the afternoon of 08/28/17 and initiated EMS. [facility] was not notified prior to admission. Patient has out of facility DNR on shadow chart.   Day 2 of [facility] GIP  Chart reviewed and spoke with bedside RN Shirlean Mylar prior to brief visit with patient. Per RN and RT, patient has refusing and minimally engaging. Per RN and chart review patient has had minimal PO intake. Per nutrition consult 08/29/17, patient is well known to service and likes ensure and has agreed to continue ensure. Patient did not work with PT/OT yesterday due to fatigue. Plan is to attempt PT/OT again today. [facility] Team NP Josh Borders met with patient for goals of care and symptom management recommendations. Visited patient in isolation room for shingles. Patient curled up sleeping on right side with visible SOB at rest. No visitors present. Patient opened his eyes, nodded his head in acknowledgment of my presence. When asked if he needed anything he replied "no" and closed his eyes again. His breakfast tray has not been touched.      Per Dr. Marlowe Sax Epic note 08/30/17: Goals of Care are to continue IV ABX 24 hrs for HCAP, if stable transition to oral ABX and return to facility.  Medications: Albuterol 2.11m nebulizer Q4hr, Eliquis 572mtab BID, Brovana 15 mcg nebulizer BID, Pulimicort 0.78m68mebulizer BID, Ensure 237m68mse TID, Incruse Ellipta 62.78mcg22mhaler 1 puff QD, Insulin slide scale in use,  Claritin 10mg 578mQD, Reglan 78mg Ta70mID, Lopressor 50mg ta16mD, Remeeron 178mg tab87m K-DUR CR 20mEq tab64m Deltasone 40mg tab w31mbreakfast, Valtrex 1000mg tab TI56montinuous Medications: Maxipime 1g in 100ml NS IVPB378mhr.   [facility] will continue to follow patient daily during hospitalization and continue to initiate goals of care discussions.   Discharge Planning: [facility] will continue to assess ongoing discharge needs during hospitalization. Please contact [facility] if ambulance transport is needed.   Communication with [facility]: Attempted phone contact with patient's mother Martha HarperLyndol Vanderheiden an answer and unable to leave message.   Communication with [facility]: Primary physician, Dr. Mohamed made Julien Nordmannf hospitalization 08/29/17. [facility] MD and RN updated on this admission [facility] SW visited patient and made contact with [facility] 08/29/17.  [facility] medication list and transfer summary placed on shadow chart 08/29/17.  Please contact [facility] with Hospice related questions.   Thank you,  Eva Davis, LCErling Conte1-Lincoln Villagen AMION under Hospice and Palliative [place]

## 2017-08-30 NOTE — Progress Notes (Addendum)
PROGRESS NOTE [facility]   [name]   OVZ:[id number] DOB: 23-May-1960  DOA: 08/21/2017 PCP: Johnathan Joe, MD   Brief Narrative:  [name] is a 57 year old male with past medical history of stage IV NSCLC, oxygen dependent, COPD with diastolic dysfunction who have multiple hospitalization due to COPD exacerbation.  He is under hospice care at [facility] but was brought to the emergency department due to shortness of breath and anxiety. Initial thought of acute on chronic respiratory failure was found to be multifactorial with more component of CHF however patient patient was dry on exam and renal function increases after Lasix.  CT of the chest was done and was consistent with infectious process.  Patient been treated for HCAP empiric antibiotics.  COPD exacerbation being treated with nebulizers and steroids.  Subjective: Patient seen and examined, he is known cooperative today with my examination and evaluation.  He said that he is tired and wants to sleep.  However report that his breathing is slight better.  Patient became aggressive yesterday and security was called.  No medications were needed.  Assessment & Plan: Acute on chronic respiratory failure with hypoxia Felt to be multifactorial with HCAP, COPD exacerbation and mild diastolic heart failure. CT chest which showed loculated moderate right pleural effusion and right lower and left lower lobe changes consistent with bronchitis. New patchy retinacular density in the left lower lobe likely represent pneumonia. Treat underlying causes  HCAP setting of immunocompromise patient Clinically improving, remains afebrile, oxygen requirement slightly above baseline.  Will continue empiric IV antibiotics for 24 hours, if remains stable can transition to oral antibiotics in a.m. and discharge back to SNF with hospice care.  Continue steroids  COPD exacerbation Continue Brovana, Pulmicort and duo nebs. Continue prednisone, wean  oxygen to keep O2 between 88 to 95% Encourage flutter valve  Acute on chronic diastolic CHF Upon admission patient with some signs of fluid overload, patient received Lasix with minimal diuresis.  Currently on exam patient with minimal bilateral lower extremity edema trace and good air entry.  IV Lasix will be discontinued and will start oral Lasix 40 mg daily.  CKD stage III Creatinine at baseline, monitor renal function daily  Hypertension BP stable, continue current regimen  Stage IV NSCLC Patient on hospice, have been unable to do chemo due to recurrent admissions. Follow-up with Dr. Earlie [name] as an outpatient  Hx of DVT  On Eliquis  Goals of care Patient is noncompliant with medications while in hospital he weakened she is whatever treatment he was to receive.  Patient is currently stable and will benefit completing antibiotic therapy at this time. However patient is an excellent candidate for hospice facility.   DVT prophylaxis: Eliquis Code Status: DNR/DNI Family Communication: None at bedside Disposition Plan: Hospice facility versus SNF with hospice in 24 hours if continues to be stable.  Consultants:   Palliative care   Procedures:   None   Antimicrobials: Anti-infectives (From admission, onward)   Start     Dose/Rate Route Frequency Ordered Stop   08/29/17 1000  amoxicillin-clavulanate (AUGMENTIN) 500-125 MG per tablet 500 mg  Status:  Discontinued     1 tablet Oral 3 times daily 08/29/17 0040 08/29/17 0051   08/29/17 1000  valACYclovir (VALTREX) tablet 1,000 mg     1,000 mg Oral 3 times daily 08/29/17 0040     08/29/17 0245  ceFEPIme (MAXIPIME) 1 g in sodium chloride 0.9 % 100 mL IVPB     1 g  200 mL/hr over 30 Minutes Intravenous Every 12 hours 08/29/17 0051 09/02/17 2159       Objective: Vitals:   08/30/17 0200 08/30/17 0300 08/30/17 0400 08/30/17 0500  BP: 116/68 (!) 173/93 108/62 126/67  Pulse: 81 80 79 81  Resp: (!) 21 17 18 18   Temp:   (!) 97.4  F (36.3 C)   TempSrc:   Axillary   SpO2: 100% 100% 100% 100%  Weight:      Height:        Intake/Output Summary (Last 24 hours) at 08/30/2017 0833 Last data filed at 08/30/2017 0248 Gross per 24 hour  Intake 440 ml  Output 1000 ml  Net -560 ml   Filed Weights   09/03/2017 2117  Weight: 52.2 kg    Examination:  General exam: NAD  Respiratory system: Good air entry, diffuse rhonchi, no wheezing or crackles  Cardiovascular system: S1S2 RRR no murmurs Gastrointestinal system: Abdomen is nondistended, soft and nontender.  Central nervous system: Alert and oriented. No focal neurological deficits. Extremities: B/L trace edema  Skin: No rashes Psychiatry: Mood & affect flat.   Data Reviewed: I have personally reviewed following labs and imaging studies  CBC: Recent Labs  Lab 08/23/17 0842 08/24/17 2119 08/26/17 1005 08/27/17 1755 09/10/2017 2159  WBC 18.9* 16.9* 12.7* 15.1* 13.8*  NEUTROABS 16.7 15.1* 10.9* 13.2* 11.7*  HGB 9.6* 9.3* 7.9* 10.8* 11.5*  HCT 30.3* 29.4* 24.4* 33.1* 37.1*  MCV 104.1* 105.8* 101.0* 99.7 102.2*  PLT 269 234 177 169 867   Basic Metabolic Panel: Recent Labs  Lab 08/26/17 1005 08/27/17 1755 09/04/2017 2159 08/29/17 0411 08/30/17 0510  NA 140 145 142 144 141  K 3.9 3.5 3.5 4.0 4.4  CL 95* 98 97* 97* 96*  CO2 36* 38* 32 33* 37*  GLUCOSE 98 92 140* 191* 112*  BUN 22* 20 19 21* 19  CREATININE 1.58* 1.50* 1.53* 1.71* 1.56*  CALCIUM 9.0 9.1 9.1 9.6 9.1   GFR: Estimated Creatinine Clearance: 38.1 mL/min (A) (by C-G formula based on SCr of 1.56 mg/dL (H)). Liver Function Tests: Recent Labs  Lab 08/23/17 0842 08/24/17 2119 08/26/17 1005 08/27/17 1755 09/20/2017 2159  AST 27 21 21 21 22   ALT 21 19 16 14 17   ALKPHOS 113 112 127* 120 118  BILITOT 0.4 0.5 0.2* 0.4 0.8  PROT 6.8 6.5 6.2* 6.3* 6.7  ALBUMIN 3.5 3.5 2.9* 3.1* 3.2*   Recent Labs  Lab 08/24/17 2119 09/17/2017 2159  LIPASE 24 21   No results for input(s): AMMONIA in the last  168 hours. Coagulation Profile: No results for input(s): INR, PROTIME in the last 168 hours. Cardiac Enzymes: Recent Labs  Lab 09/04/2017 2159  TROPONINI 0.04*   BNP (last 3 results) No results for input(s): PROBNP in the last 8760 hours. HbA1C: No results for input(s): HGBA1C in the last 72 hours. CBG: Recent Labs  Lab 08/24/17 0446 08/29/17 0308 08/29/17 0742 08/29/17 1237 08/29/17 2105  GLUCAP 76 224* 136* 191* 119*   Lipid Profile: No results for input(s): CHOL, HDL, LDLCALC, TRIG, CHOLHDL, LDLDIRECT in the last 72 hours. Thyroid Function Tests: No results for input(s): TSH, T4TOTAL, FREET4, T3FREE, THYROIDAB in the last 72 hours. Anemia Panel: No results for input(s): VITAMINB12, FOLATE, FERRITIN, TIBC, IRON, RETICCTPCT in the last 72 hours. Sepsis Labs: No results for input(s): PROCALCITON, LATICACIDVEN in the last 168 hours.  Recent Results (from the past 240 hour(s))  MRSA PCR Screening     Status: None  Collection Time: 08/29/17  4:00 AM  Result Value Ref Range Status   MRSA by PCR NEGATIVE NEGATIVE Final    Comment:        The GeneXpert MRSA Assay (FDA approved for NASAL specimens only), is one component of a comprehensive MRSA colonization surveillance program. It is not intended to diagnose MRSA infection nor to guide or monitor treatment for MRSA infections. Performed at [facility], [place] [address] [place], [place] [address]       Radiology Studies: Ct Chest Wo Contrast  Result Date: 08/29/2017 CLINICAL DATA:  COPD exacerbation. EXAM: CT CHEST WITHOUT CONTRAST TECHNIQUE: Multidetector CT imaging of the chest was performed following the standard protocol without IV contrast. COMPARISON:  CT chest dated August 23, 2017. FINDINGS: Cardiovascular: Normal heart size. No pericardial effusion. Normal caliber thoracic aorta. Unchanged right chest wall port catheter with the tip in the proximal right atrium. Mediastinum/Nodes: No enlarged  mediastinal or axillary lymph nodes. Thyroid gland, trachea, and esophagus demonstrate no significant findings. Lungs/Pleura: Increased moderate to severe bronchial wall thickening in the right upper lobe and left lower lobe. New patchy reticulonodular densities in the left lower lobe. New mild smooth interlobular septal thickening in the right lung. Slightly increased partially loculated moderate right pleural effusion. Slightly increased right middle and lower lobe atelectasis. Moderate to severe centrilobular and paraseptal emphysema again noted. Unchanged left suprahilar scarring and volume loss. Upper Abdomen: No acute abnormality. Musculoskeletal: Stable sclerosis of the T12 vertebral body. No acute osseous finding. IMPRESSION: 1. Increased moderate to severe airway thickening in the right upper lobe and left lower lobe, consistent with bronchitis. New patchy reticulonodular densities in the left lower lobe are likely infectious or inflammatory in etiology. 2. Slightly increased partially loculated moderate right pleural effusion with new interstitial edema in the right lung. 3.  Emphysema (ICD10-J43.9). Electronically Signed   By: Titus Dubin M.D.   On: 08/29/2017 12:59   Ct Abdomen Pelvis W Contrast  Result Date: 08/29/2017 CLINICAL DATA:  Acute onset of shortness of breath and generalized abdominal pain. EXAM: CT ABDOMEN AND PELVIS WITH CONTRAST TECHNIQUE: Multidetector CT imaging of the abdomen and pelvis was performed using the standard protocol following bolus administration of intravenous contrast. CONTRAST:  83mL ISOVUE-300 IOPAMIDOL (ISOVUE-300) INJECTION 61% COMPARISON:  CT of the abdomen and pelvis performed 07/16/2017, and right upper quadrant ultrasound performed 07/27/2017 FINDINGS: Lower chest: There is a moderate right-sided pleural effusion, with partial consolidation of the right lower lobe, concerning for pneumonia superimposed on the patient's known lung malignancy. Mild atelectasis  or pneumonia is noted at the left lung base. Trace left-sided pleural fluid is noted. Wall thickening along the distal esophagus may reflect esophagitis. Hepatobiliary: The gallbladder is mildly distended. There is mild prominence of the intrahepatic biliary ducts. The common bile duct remains normal in caliber. Would correlate with LFTs. The liver is otherwise unremarkable in appearance. Pancreas: The pancreas is within normal limits. Spleen: The spleen is unremarkable in appearance. Adrenals/Urinary Tract: The adrenal glands are unremarkable in appearance. The kidneys are within normal limits. There is no evidence of hydronephrosis. No renal or ureteral stones are identified. Mild nonspecific perinephric stranding is noted bilaterally. Stomach/Bowel: The stomach is grossly unremarkable in appearance. There is question of mild soft tissue edema about the duodenum, which could reflect mild duodenitis. The remainder of the small bowel is grossly unremarkable. The appendix is normal in caliber, without evidence of appendicitis. The colon is grossly unremarkable in appearance. Vascular/Lymphatic: Scattered calcification is  seen along the abdominal aorta and its branches. The abdominal aorta is otherwise grossly unremarkable. The inferior vena cava is grossly unremarkable. No retroperitoneal lymphadenopathy is seen. No pelvic sidewall lymphadenopathy is identified. Reproductive: The bladder is mildly distended and grossly unremarkable. The prostate remains normal in size. Other: No additional soft tissue abnormalities are seen. Musculoskeletal: No acute osseous abnormalities are identified. Diffuse sclerosis is again noted at vertebral body T12, and a sclerotic focus is noted at vertebral body L2. The visualized musculature is unremarkable in appearance. IMPRESSION: 1. Moderate right-sided pleural effusion, with partial consolidation of the right lower lobe, concerning for pneumonia superimposed on the patient's known  lung malignancy. Mild atelectasis or pneumonia at the left lung base. Trace left-sided pleural fluid noted. 2. Wall thickening along the distal esophagus may reflect esophagitis. 3. Question of mild soft tissue edema about the duodenum, which could reflect mild duodenitis. 4. Mild prominence of the intrahepatic biliary ducts. The common bile duct remains normal in caliber. Would correlate with LFTs. 5. Diffuse sclerosis again noted at vertebral body T12, and a sclerotic focus at vertebral body L2, likely reflecting sequelae of metastatic disease. Aortic Atherosclerosis (ICD10-I70.0). Electronically Signed   By: Garald Balding M.D.   On: 08/29/2017 00:28   Dg Chest Portable 1 View  Result Date: 08/24/2017 CLINICAL DATA:  Shortness of breath.  History of lung carcinoma EXAM: PORTABLE CHEST 1 VIEW COMPARISON:  August 27, 2017 FINDINGS: Port-A-Cath tip is at the cavoatrial junction. No pneumothorax. There is a right pleural effusion with consolidation in the right base. There is a smaller left pleural effusion with left base atelectasis. There is asymmetric interstitial edema on the right. Heart size and pulmonary vascularity are normal. No adenopathy is appreciable. No bone lesions. IMPRESSION: Persistent right pleural effusion with right base consolidation. Interstitial edema on the right potentially could represent lymphangitic spread of tumor. On the left, there is mild left base atelectasis with small left pleural effusion. Heart size is within normal limits. No adenopathy is appreciable. Overall no appreciable change from 1 day prior. Electronically Signed   By: Lowella Grip III M.D.   On: 09/19/2017 21:54      Scheduled Meds: . albuterol  2.5 mg Nebulization Q4H  . apixaban  5 mg Oral BID  . arformoterol  15 mcg Nebulization BID  . budesonide (PULMICORT) nebulizer solution  0.5 mg Nebulization BID  . feeding supplement (ENSURE ENLIVE)  237 mL Oral TID BM  . feeding supplement (PRO-STAT SUGAR  FREE 64)  30 mL Oral Daily  . ferrous sulfate  325 mg Oral BID WC  . folic acid  1 mg Oral Daily  . gabapentin  100 mg Oral TID  . guaiFENesin  1,200 mg Oral BID  . insulin aspart  0-15 Units Subcutaneous TID WC  . insulin aspart  0-5 Units Subcutaneous QHS  . loratadine  10 mg Oral Daily  . mouth rinse  15 mL Mouth Rinse BID  . metoCLOPramide  5 mg Oral TID  . metoprolol tartrate  50 mg Oral BID  . mirtazapine  15 mg Oral QHS  . pantoprazole  40 mg Oral BID  . polyethylene glycol  17 g Oral Daily  . potassium chloride  20 mEq Oral Daily  . predniSONE  40 mg Oral Q breakfast  . senna-docusate  2 tablet Oral BID  . sucralfate  1 g Oral TID WC & HS  . umeclidinium bromide  1 puff Inhalation Daily  . valACYclovir  1,000  mg Oral TID   Continuous Infusions: . ceFEPime (MAXIPIME) IV Stopped (08/29/17 2138)     LOS: 1 day    Time spent: Total of 25 minutes spent with pt, greater than 50% of which was spent in discussion of  treatment, counseling and coordination of care   Chipper Oman, MD Pager: Text Page via [email or web address]   If 7PM-7AM, please contact night-coverage [email or web address] 08/30/2017, 8:34 AM   Note - This record has been created using [employer]. Chart creation errors have been sought, but may not always have been located. Such creation errors do not reflect on the standard of medical care.

## 2017-08-30 NOTE — Progress Notes (Signed)
Called report to [employer] on [facility]. Will transport pt in bed on 5 liters O2

## 2017-08-30 NOTE — Progress Notes (Signed)
PT Cancellation Note  Patient Details Name: [name] MRN: [id number] DOB: 02/18/1960   Cancelled Treatment:     PT eval attempted x 2 this am.  Pt requests return this pm.  Will follow.   Micayla Brathwaite 08/30/2017, 1:02 PM

## 2017-08-31 LAB — CBC WITH DIFFERENTIAL/PLATELET
BASOS PCT: 0 %
Basophils Absolute: 0 10*3/uL (ref 0.0–0.1)
EOS ABS: 0 10*3/uL (ref 0.0–0.7)
Eosinophils Relative: 0 %
HCT: 30.9 % — ABNORMAL LOW (ref 39.0–52.0)
HEMOGLOBIN: 9.6 g/dL — AB (ref 13.0–17.0)
LYMPHS ABS: 1.2 10*3/uL (ref 0.7–4.0)
Lymphocytes Relative: 11 %
MCH: 32.5 pg (ref 26.0–34.0)
MCHC: 31.1 g/dL (ref 30.0–36.0)
MCV: 104.7 fL — ABNORMAL HIGH (ref 78.0–100.0)
Monocytes Absolute: 0.5 10*3/uL (ref 0.1–1.0)
Monocytes Relative: 4 %
NEUTROS PCT: 85 %
Neutro Abs: 9.6 10*3/uL (ref 1.7–7.7)
Platelets: 160 10*3/uL (ref 150–400)
RBC: 2.95 MIL/uL — ABNORMAL LOW (ref 4.22–5.81)
RDW: 17.2 % — ABNORMAL HIGH (ref 11.5–15.5)
WBC: 11.3 10*3/uL — AB (ref 4.0–10.5)

## 2017-08-31 LAB — BASIC METABOLIC PANEL
Anion gap: 8 (ref 5–15)
BUN: 19 mg/dL (ref 6–20)
CHLORIDE: 100 mmol/L (ref 98–111)
CO2: 38 mmol/L — ABNORMAL HIGH (ref 22–32)
Calcium: 9.1 mg/dL (ref 8.9–10.3)
Creatinine, Ser: 1.63 mg/dL — ABNORMAL HIGH (ref 0.61–1.24)
GFR calc non Af Amer: 45 mL/min — ABNORMAL LOW (ref >60)
GFR, EST AFRICAN AMERICAN: 52 mL/min — AB (ref >60)
Glucose, Bld: 130 mg/dL — ABNORMAL HIGH (ref 70–99)
POTASSIUM: 3.7 mmol/L (ref 3.5–5.1)
SODIUM: 146 mmol/L — AB (ref 135–145)

## 2017-08-31 LAB — MAGNESIUM: MAGNESIUM: 2.2 mg/dL (ref 1.7–2.4)

## 2017-08-31 MED ORDER — MORPHINE SULFATE (PF) 2 MG/ML IV SOLN
2.0000 mg | Freq: Once | INTRAVENOUS | Status: DC
  Filled 2017-08-31: qty 1

## 2017-08-31 MED ORDER — ALBUTEROL SULFATE (2.5 MG/3ML) 0.083% IN NEBU: 2.5000 mg | INHALATION_SOLUTION | Freq: Four times a day (QID) | RESPIRATORY_TRACT | Status: DC

## 2017-09-16 ENCOUNTER — Other Ambulatory Visit: Payer: Medicaid Other

## 2017-09-16 ENCOUNTER — Ambulatory Visit: Payer: Medicaid Other | Admitting: Internal Medicine

## 2017-09-16 ENCOUNTER — Ambulatory Visit: Payer: Medicaid Other

## 2017-09-21 NOTE — Progress Notes (Signed)
Family, including mother, visited patient at bedside and collected belongings. Family to call RN after they speak with their minister to select funeral home.  Barbee Shropshire. Brigitte Pulse, RN
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
## 2017-09-21 NOTE — Progress Notes (Signed)
Still awaiting call back from patient's mother re: funeral arrangements. Spoke with mother at approximately 21 and she stated she did not know what to do. She further stated " I am going to call my son and I will call you back". Will inform oncoming RN.

## 2017-09-21 NOTE — Progress Notes (Signed)
Called to patient's room by NT Jameshia at approximately [phone] who stated the patient was on the floor. When I entered the room, patient was lying face down and had a small amount of yellow secretions around his mouth. Patient was unresponsive, no pulse palpated. Prior to discovering the patient on he floor, the NT went to take his vital signs at approximately 0420  but she did not see him in the bed and assumed he was in the bathroom as he ambulates independently. NT returned at approximately 0453 and noticed that the patient was not in bed. She went to knock on the bathroom door and that was when she noticed the patient on the floor.

## 2017-09-21 NOTE — Progress Notes (Signed)
Post mortem checklist complete. Patient placement notified.  [name]. Brigitte Pulse, RN

## 2017-09-21 NOTE — Progress Notes (Signed)
   09/19/2017 0534  What Happened  Was fall witnessed? No  Was patient injured? Yes  Patient found on floor  Found by Staff-comment James Ivanoff)  Stated prior activity bathroom-unassisted  Follow Up  MD notified yes  Time MD notified [phone]  Family notified Yes-comment  Time family notified 0529  Additional tests No  Progress note created (see row info) Yes

## 2017-09-21 DEATH — deceased

## 2017-10-23 NOTE — Telephone Encounter (Signed)
VM left 

## 2018-05-20 IMAGING — US US RENAL
1 series · 14 of 25 positions shown · non-contrast
Comparison: None.

CLINICAL DATA: Renal failure. History of hypertension and lung
cancer.

EXAM:
RENAL / URINARY TRACT ULTRASOUND COMPLETE

[Series 1: us renal · 0.23mm/px · 14 of 32 slices shown]
[im 1/32]
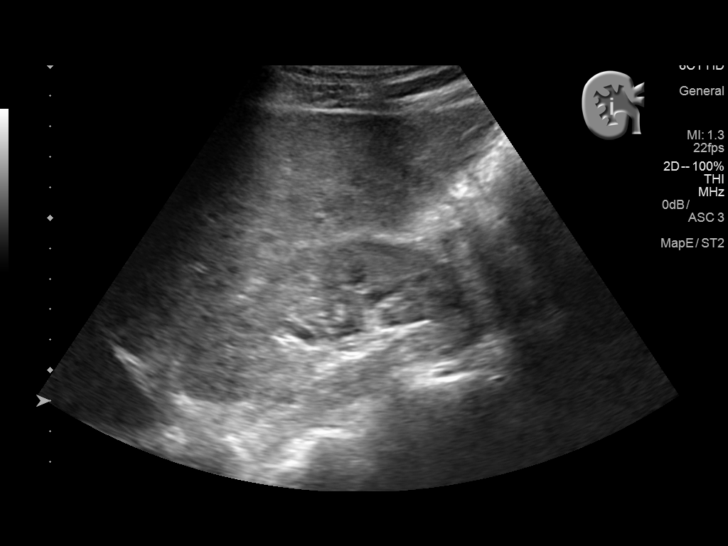
[im 3/32]
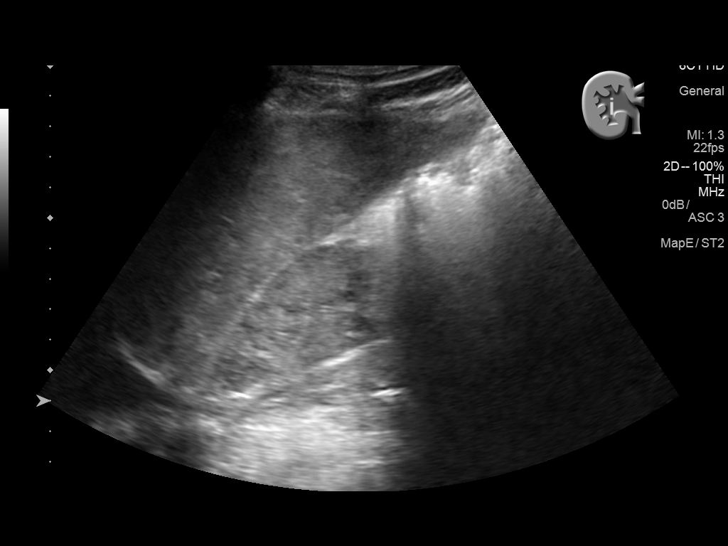
[im 6/32]
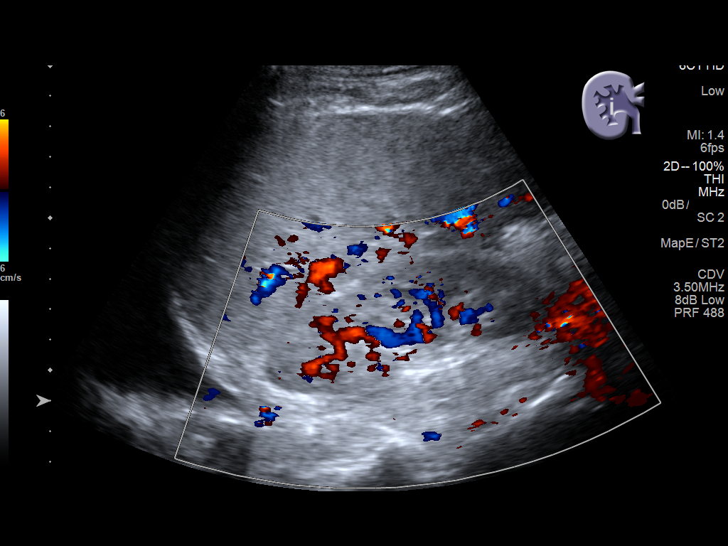
[im 8/32]
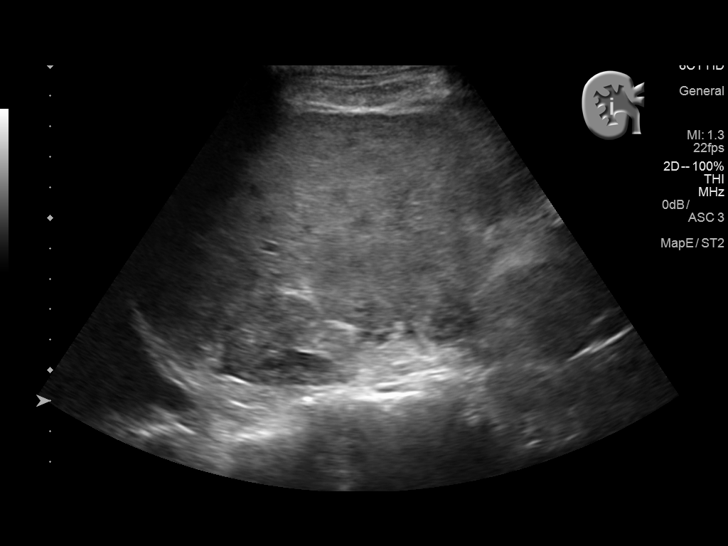
[im 11/32]
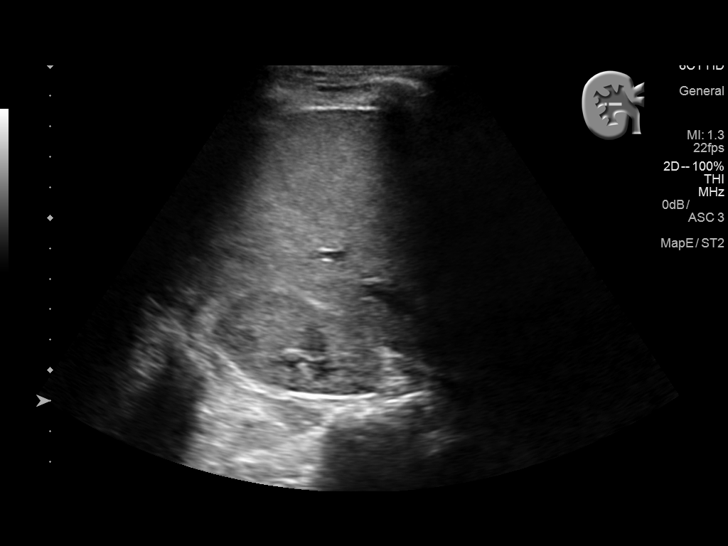
[im 12/32]
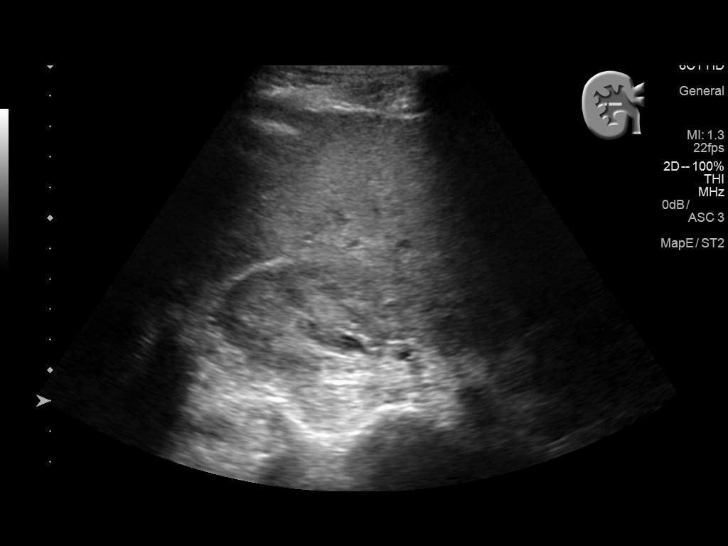
[im 15/32]
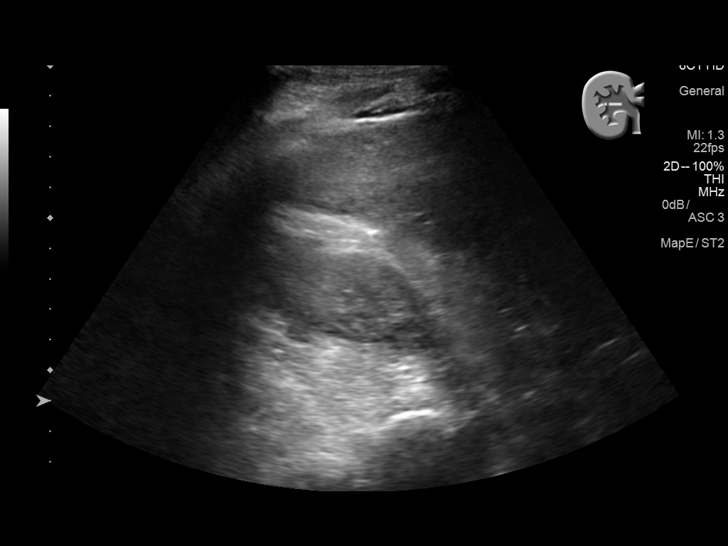
[im 17/32]
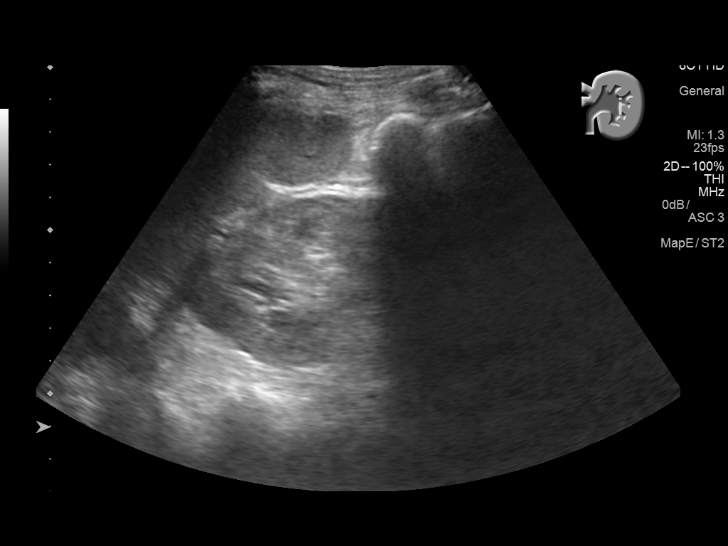
[im 20/32]
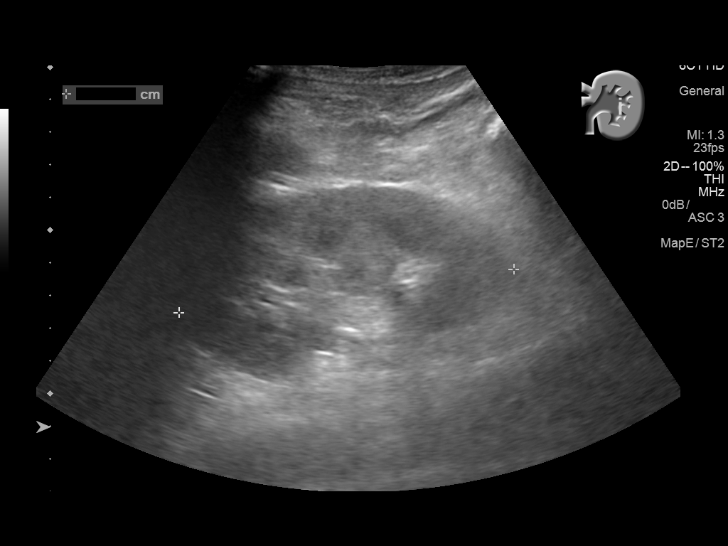
[im 21/32]
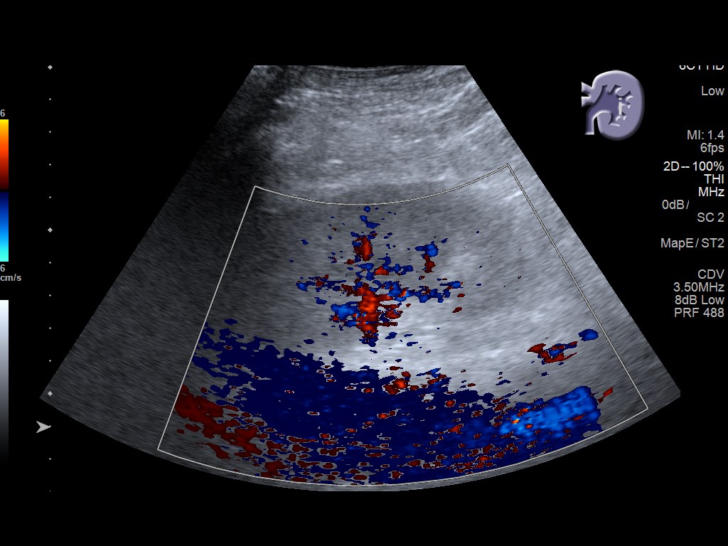
[im 24/32]
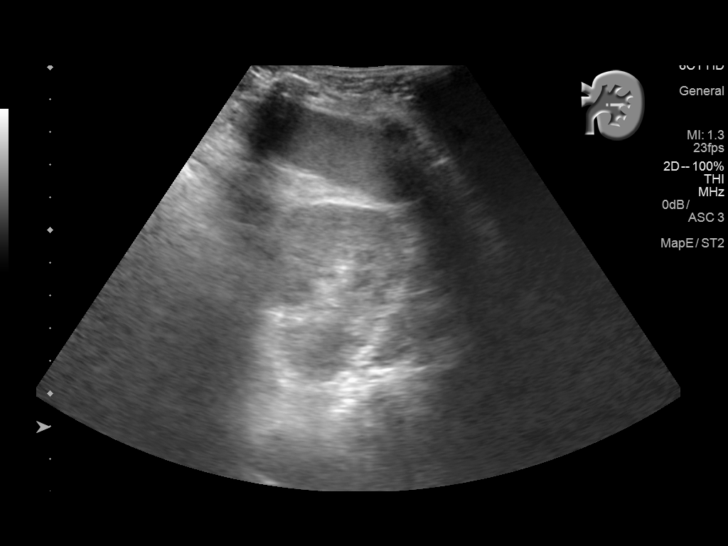
[im 26/32]
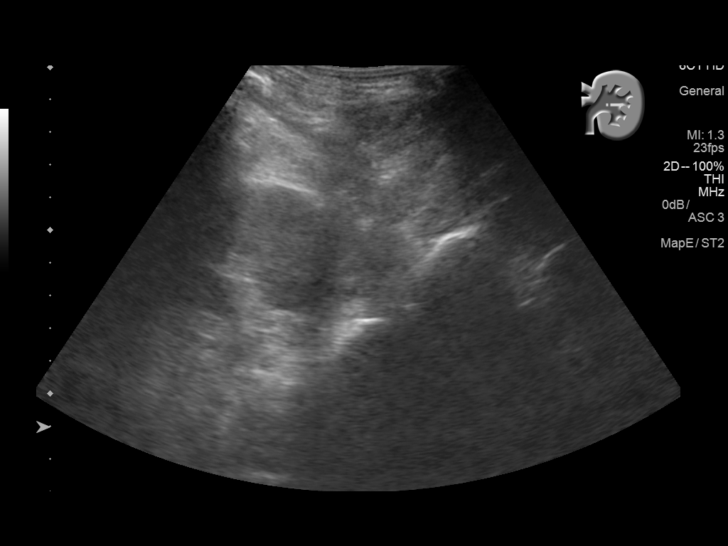
[im 29/32]
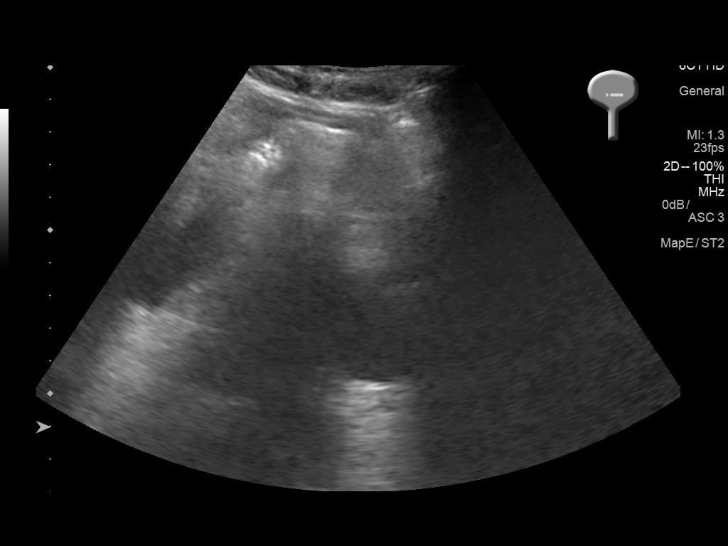
[im 32/32]
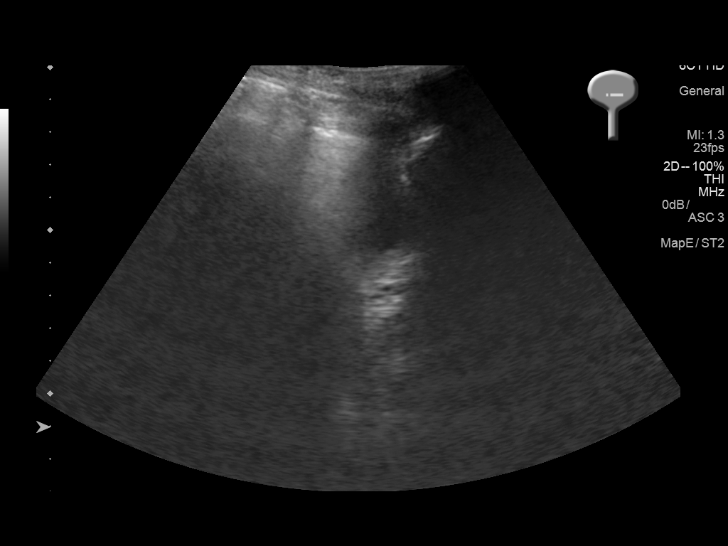

[14 of 25 positions shown; findings below may reference images not displayed]

FINDINGS: Right Kidney:

Length: 9.8 cm. Renal cortex is diffusely echogenic. No mass or
hydronephrosis visualized.

Left Kidney:

Length: 10.3 cm. Echogenicity within normal limits. No mass or
hydronephrosis visualized.

Bladder:

Bladder is decompressed status post voiding.

Bilateral pleural effusions.
IMPRESSION: 1. RIGHT kidney appears echogenic suggesting chronic medical renal
disease. Echogenicity of the LEFT kidney is within normal limits. No
hydronephrosis bilaterally.
2. Bladder is decompressed.
3. Bilateral pleural effusions.

## 2020-04-22 IMAGING — US US THORACENTESIS ASP PLEURAL SPACE W/IMG GUIDE
1 series · 4 of 4 positions shown · non-contrast
Comparison: none

INDICATION: Patient with history of metastatic lung cancer, dyspnea, recurrent
right pleural effusion. Request made for diagnostic and therapeutic
right thoracentesis.

[Series 1: us thoracentesis asp pleural space w/img guide · 4 of 4 slices shown]
[im 1/4]
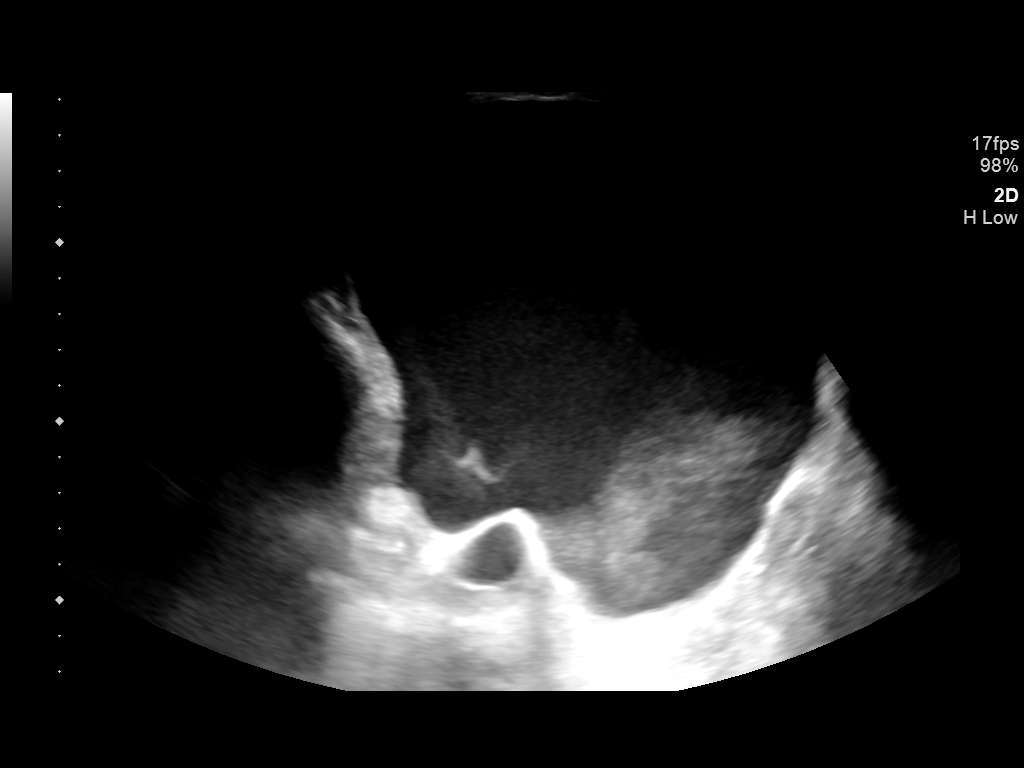
[im 2/4]
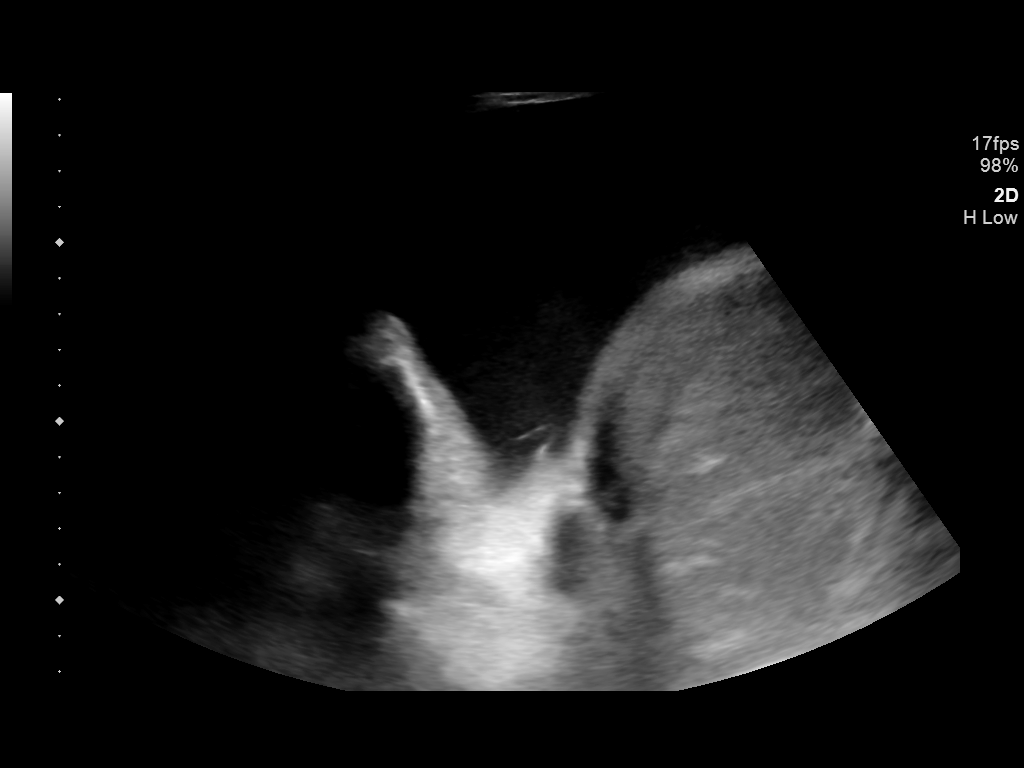
[im 3/4]
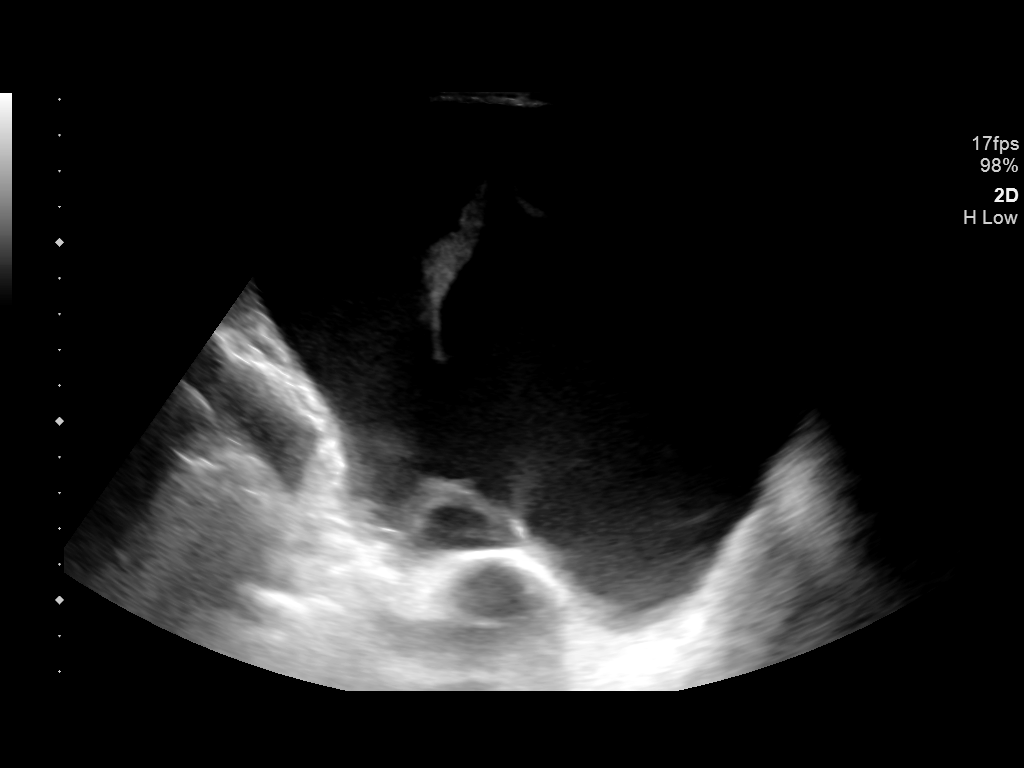
[im 4/4]
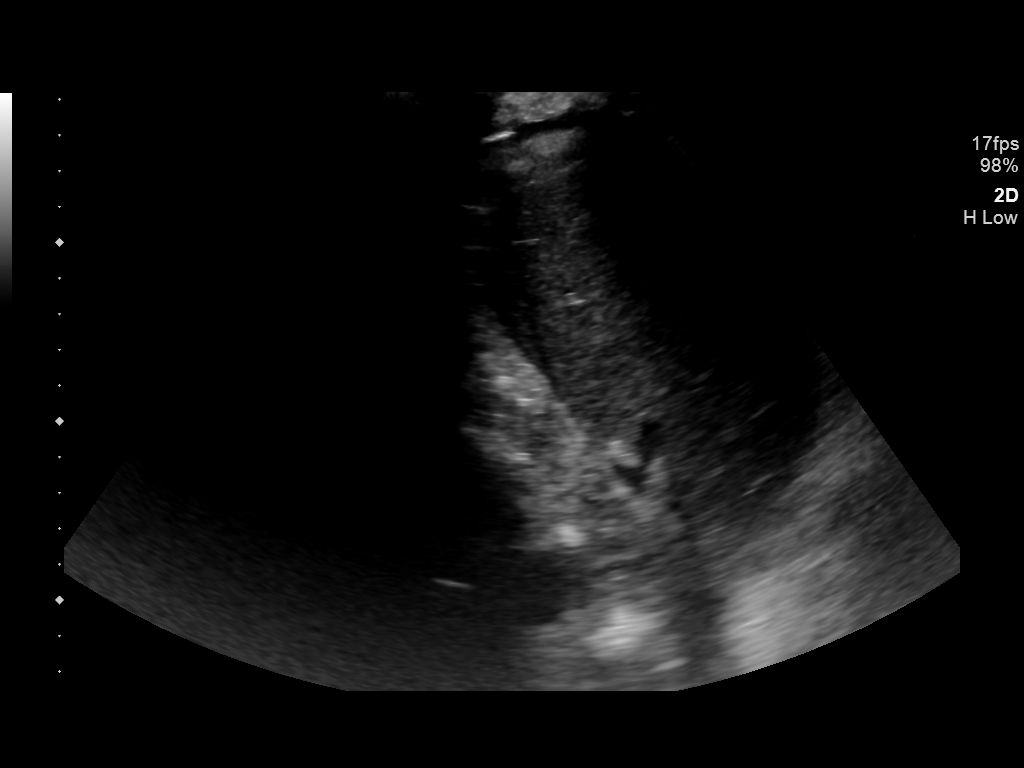

[4 of 4 positions shown; findings below may reference images not displayed]

EXAM:
ULTRASOUND GUIDED DIAGNOSTIC AND THERAPEUTIC RIGHT THORACENTESIS

MEDICATIONS:
None

COMPLICATIONS:
None immediate.

PROCEDURE:
An ultrasound guided thoracentesis was thoroughly discussed with the
patient and questions answered. The benefits, risks, alternatives
and complications were also discussed. The patient understands and
wishes to proceed with the procedure. Written consent was obtained.

Ultrasound was performed to localize and mark an adequate pocket of
fluid in the right chest. The area was then prepped and draped in
the normal sterile fashion. 1% Lidocaine was used for local
anesthesia. Under ultrasound guidance a 6 Fr Safe-T-Centesis
catheter was introduced. Thoracentesis was performed. The catheter
was removed and a dressing applied.
FINDINGS: A total of approximately 1.2 liters of bloody fluid was removed.
Samples were sent to the laboratory as requested by the clinical
team.
IMPRESSION: Successful ultrasound guided diagnostic and therapeutic right
thoracentesis yielding 1.2 liters of pleural fluid. Follow-up chest
x-ray revealed no pneumothorax.
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
